# Patient Record
Sex: Female | Born: 1950 | Race: Black or African American | Hispanic: No | Marital: Married | Smoking: Never smoker
Health system: Southern US, Community
[De-identification: ages and names within clinical notes are randomized; demographics above are authoritative.]

## PROBLEM LIST (undated history)

## (undated) DIAGNOSIS — K589 Irritable bowel syndrome without diarrhea: Secondary | ICD-10-CM

## (undated) DIAGNOSIS — F419 Anxiety disorder, unspecified: Secondary | ICD-10-CM

## (undated) DIAGNOSIS — Z950 Presence of cardiac pacemaker: Secondary | ICD-10-CM

## (undated) DIAGNOSIS — G4730 Sleep apnea, unspecified: Secondary | ICD-10-CM

## (undated) DIAGNOSIS — I2510 Atherosclerotic heart disease of native coronary artery without angina pectoris: Secondary | ICD-10-CM

## (undated) DIAGNOSIS — N3281 Overactive bladder: Secondary | ICD-10-CM

## (undated) DIAGNOSIS — E785 Hyperlipidemia, unspecified: Secondary | ICD-10-CM

## (undated) DIAGNOSIS — I10 Essential (primary) hypertension: Secondary | ICD-10-CM

## (undated) DIAGNOSIS — C801 Malignant (primary) neoplasm, unspecified: Secondary | ICD-10-CM

## (undated) DIAGNOSIS — H409 Unspecified glaucoma: Secondary | ICD-10-CM

## (undated) DIAGNOSIS — I2699 Other pulmonary embolism without acute cor pulmonale: Secondary | ICD-10-CM

## (undated) DIAGNOSIS — T7840XA Allergy, unspecified, initial encounter: Secondary | ICD-10-CM

## (undated) DIAGNOSIS — I509 Heart failure, unspecified: Secondary | ICD-10-CM

## (undated) DIAGNOSIS — I219 Acute myocardial infarction, unspecified: Secondary | ICD-10-CM

## (undated) HISTORY — DX: Anxiety disorder, unspecified: F41.9

## (undated) HISTORY — DX: Atherosclerotic heart disease of native coronary artery without angina pectoris: I25.10

## (undated) HISTORY — DX: Other pulmonary embolism without acute cor pulmonale: I26.99

## (undated) HISTORY — PX: JOINT REPLACEMENT: SHX530

## (undated) HISTORY — PX: CARDIAC CATHETERIZATION: SHX172

## (undated) HISTORY — DX: Overactive bladder: N32.81

## (undated) HISTORY — DX: Heart failure, unspecified: I50.9

## (undated) HISTORY — DX: Essential (primary) hypertension: I10

## (undated) HISTORY — DX: Acute myocardial infarction, unspecified: I21.9

## (undated) HISTORY — DX: Hyperlipidemia, unspecified: E78.5

## (undated) HISTORY — PX: INSERT / REPLACE / REMOVE PACEMAKER: SUR710

## (undated) HISTORY — DX: Irritable bowel syndrome, unspecified: K58.9

## (undated) HISTORY — DX: Allergy, unspecified, initial encounter: T78.40XA

## (undated) HISTORY — PX: ABDOMINAL HYSTERECTOMY: SHX81

## (undated) HISTORY — DX: Unspecified glaucoma: H40.9

## (undated) HISTORY — PX: OTHER SURGICAL HISTORY: SHX169

---

## 2004-04-09 ENCOUNTER — Emergency Department (HOSPITAL_COMMUNITY): Admission: EM | Admit: 2004-04-09 | Discharge: 2004-04-09 | Payer: Self-pay | Admitting: Emergency Medicine

## 2007-05-17 ENCOUNTER — Emergency Department (HOSPITAL_COMMUNITY): Admission: EM | Admit: 2007-05-17 | Discharge: 2007-05-18 | Payer: Self-pay | Admitting: Emergency Medicine

## 2007-05-24 DIAGNOSIS — I219 Acute myocardial infarction, unspecified: Secondary | ICD-10-CM

## 2007-05-24 HISTORY — DX: Acute myocardial infarction, unspecified: I21.9

## 2009-04-30 ENCOUNTER — Ambulatory Visit: Payer: Self-pay | Admitting: Gastroenterology

## 2009-04-30 DIAGNOSIS — K30 Functional dyspepsia: Secondary | ICD-10-CM | POA: Insufficient documentation

## 2009-04-30 DIAGNOSIS — R109 Unspecified abdominal pain: Secondary | ICD-10-CM | POA: Insufficient documentation

## 2009-04-30 HISTORY — DX: Functional dyspepsia: K30

## 2009-05-01 ENCOUNTER — Encounter: Payer: Self-pay | Admitting: Gastroenterology

## 2009-05-01 LAB — CONVERTED CEMR LAB: Creatinine, Ser: 0.77 mg/dL (ref 0.40–1.20)

## 2009-05-05 ENCOUNTER — Ambulatory Visit (HOSPITAL_COMMUNITY): Admission: RE | Admit: 2009-05-05 | Discharge: 2009-05-05 | Payer: Self-pay | Admitting: Gastroenterology

## 2009-05-06 ENCOUNTER — Encounter: Payer: Self-pay | Admitting: Gastroenterology

## 2009-05-11 ENCOUNTER — Ambulatory Visit: Payer: Self-pay | Admitting: Gastroenterology

## 2009-05-11 ENCOUNTER — Ambulatory Visit (HOSPITAL_COMMUNITY): Admission: RE | Admit: 2009-05-11 | Discharge: 2009-05-11 | Payer: Self-pay | Admitting: Gastroenterology

## 2009-05-11 HISTORY — PX: ESOPHAGOGASTRODUODENOSCOPY: SHX1529

## 2009-05-14 ENCOUNTER — Encounter: Payer: Self-pay | Admitting: Gastroenterology

## 2009-07-08 ENCOUNTER — Encounter: Payer: Self-pay | Admitting: Gastroenterology

## 2009-07-21 ENCOUNTER — Ambulatory Visit: Payer: Self-pay | Admitting: Gastroenterology

## 2009-07-21 DIAGNOSIS — K589 Irritable bowel syndrome without diarrhea: Secondary | ICD-10-CM | POA: Insufficient documentation

## 2009-12-21 HISTORY — PX: COLONOSCOPY: SHX174

## 2010-06-01 ENCOUNTER — Encounter: Payer: Self-pay | Admitting: Urgent Care

## 2010-06-01 ENCOUNTER — Ambulatory Visit
Admission: RE | Admit: 2010-06-01 | Discharge: 2010-06-01 | Payer: Self-pay | Source: Home / Self Care | Attending: Urgent Care | Admitting: Urgent Care

## 2010-06-01 DIAGNOSIS — K219 Gastro-esophageal reflux disease without esophagitis: Secondary | ICD-10-CM | POA: Insufficient documentation

## 2010-06-07 ENCOUNTER — Encounter: Payer: Self-pay | Admitting: Internal Medicine

## 2010-06-22 NOTE — Letter (Signed)
Summary: CT SCAN ABD/PELVIS ORDER  CT SCAN ABD/PELVIS ORDER   Imported By: Ave Filter 05/01/2009 12:54:38  _____________________________________________________________________  External Attachment:    Type:   Image     Comment:   External Document

## 2010-06-22 NOTE — Assessment & Plan Note (Signed)
Summary: IBS-C   Visit Type:  Follow-up Visit Primary Care Provider:  Lewis Moccasin, M.D.  Chief Complaint:  abd pain and dyspepsia.  History of Present Illness: Going to [place] daily and s/t 2-3x/day. Taking Benefiber and eating fruits: bananas, apples, raisins. Not as much on the water. Taking OMP every AM. Sx 95% improved. Only gets gas when she eats something she shouldn't have. Haven't had trouble with "it building up" in the mid-abdomen or RUQ. U Current Medications (verified): 1)  Klor-Con 20 Meq Pack (Potassium Chloride) .... Take 1 Tablet By Mouth Once A Day 2)  Toprol Xl 25 Mg Xr24h-Tab (Metoprolol Succinate) .... 1/2 Tablet Twice Daily 3)  Calcium 600 Mg Plus D .... Take 1 Tablet By Mouth Two Times A Day 4)  Imdur 30 Mg Xr24h-Tab (Isosorbide Mononitrate) .... Take 1 Tablet By Mouth Once A Day 5)  Indapamide 2.5 Mg Tabs (Indapamide) .... Take Two Tablets Once Daily 6)  Plavix 75 Mg Tabs (Clopidogrel Bisulfate) .... Take 1 Tablet By Mouth Once A Day 7)  Claritin 10 Mg Tabs (Loratadine) .... As Needed 8)  Tylenol Arthritis 650 Mg .... As Needed 9)  Vitamin D-3  1000mg  .... Take 1 Tablet By Mouth Once A Day 10)  Asa 81 Mg .... Take 1 Tablet By Mouth Once A Day 11)  Niaspan 500 Mg Cr-Tabs (Niacin (Antihyperlipidemic)) .... Take 1 Tablet By Mouth Once A Day 12)  Zocor 40 Mg Tabs (Simvastatin) .... Take 1 Tablet By Mouth Once A Day 13)  Detrol La 4 Mg Xr24h-Cap (Tolterodine Tartrate) .... Take 1 Tablet By Mouth Once A Day 14)  Celexa 40 Mg Tabs (Citalopram Hydrobromide) .... Take 1 Tablet By Mouth Once A Day 15)  Clonazepam 0.5 Mg Tabs (Clonazepam) .... As Needed 16)  Nitro-Dur 0.4 Mg/hr Pt24 (Nitroglycerin) .... As Needed 17)  Prilosec 20 Mg Cpdr (Omeprazole) .Marland Kitchen.. 1 By Mouth Qam  Allergies (verified): 1)  ! * Lisinopril 2)  ! Biaxin  Past History:  Past Medical History: Reviewed history from 04/30/2009 and no changes required. AMI 2009/CAD-no stent medically managed Anxiety  Disorder Hyperlipidemia Hypertension Overactive Bladder  Past Surgical History: Reviewed history from 04/30/2009 and no changes required. BSO: 2o to cyst Hysterectomy  Physical Exam  General:  Well developed, well nourished, no acute distress. Head:  Normocephalic and atraumatic. Lungs:  Clear throughout to auscultation. Heart:  Regular rate and rhythm; no murmurs Abdomen:  Soft, nontender and nondistended.  Normal bowel sounds.  Impression & Recommendations:  Problem # 1:  IRRITABLE BOWEL SYNDROME (ICD-564.1) Assessment Improved  Constipation predominant. Continue fiber and water. OPV in 6 mos. Obtain TCS report from [facility].  CC: PCP  Orders: Est. Patient Level II (91478)

## 2010-06-22 NOTE — Letter (Signed)
Summary: RELEASE OF INFORMATION  RELEASE OF INFORMATION   Imported By: Diana Eves 07/08/2009 14:22:45  _____________________________________________________________________  External Attachment:    Type:   Image     Comment:   External Document

## 2010-06-22 NOTE — Assessment & Plan Note (Signed)
Summary: ABD PAIN/CM   Visit Type:  Initial Consult Primary Care Provider:  Lewis Moccasin, M.D.  Chief Complaint:  abdominal pain.  History of Present Illness: Crampy dull pain at the bottom of her stomach, and trapped gas under her rib cage, bloating, pressure in epigatrium. Started before Thanksgiving. Seen at [facility], [address], [place], [place]. Labs drawn and she had a low potassium. Mylanta helped but not constant dull pain. Gas and bloating is new. The dull pain in lower abd has had it for a while. Dx with IBS-c long time ago. Rx w/ Miralax or MOM. TCS over 5 years at [facility]. Also had GES and EGD. Didn't find anything.   Pain at bottom of stomach doesn't move. Still has constipation. Felt like she had to heave when the presure was there but didn't vomit. No problems with swallowing. Can feel pain under shoulder blade and drinks something to make her belch (R>L) over a year. No weight loss. No other meds tried for relief. No other Rx tried. No problems with eyes turning yellow or itching. No gallstones or kidney stones. No dysuria or hematuria.  Bowels move every other day. "Tries to follow" a low fat diet. No blood in stool. On ASA and Plavix. No Ibuprofen, Aleve, BC or Goody's.  Preventive Screening-Counseling & Management  Alcohol-Tobacco     Smoking Status: never  Current Medications (verified): 1)  Klor-Con 20 Meq Pack (Potassium Chloride) .... Take 1 Tablet By Mouth Once A Day 2)  Toprol Xl 25 Mg Xr24h-Tab (Metoprolol Succinate) .... 1/2 Tablet Twice Daily 3)  Calcium 600 Mg Plus D .... Take 1 Tablet By Mouth Two Times A Day 4)  Zantac 150 Mg Tabs (Ranitidine Hcl) .... Take 1 Tablet By Mouth Once A Day 5)  Imdur 30 Mg Xr24h-Tab (Isosorbide Mononitrate) .... Take 1 Tablet By Mouth Once A Day 6)  Indapamide 2.5 Mg Tabs (Indapamide) .... Take Two Tablets Once Daily 7)  Plavix 75 Mg Tabs (Clopidogrel Bisulfate) .... Take 1 Tablet By Mouth Once A Day 8)  Claritin 10 Mg Tabs (Loratadine)  .... As Needed 9)  Tylenol Arthritis 650 Mg .... As Needed 10)  Vitamin D-3  1000mg  .... Take 1 Tablet By Mouth Once A Day 11)  Asa 81 Mg .... Take 1 Tablet By Mouth Once A Day 12)  Niaspan 500 Mg Cr-Tabs (Niacin (Antihyperlipidemic)) .... Take 1 Tablet By Mouth Once A Day 13)  Zocor 40 Mg Tabs (Simvastatin) .... Take 1 Tablet By Mouth Once A Day 14)  Detrol La 4 Mg Xr24h-Cap (Tolterodine Tartrate) .... Take 1 Tablet By Mouth Once A Day 15)  Celexa 40 Mg Tabs (Citalopram Hydrobromide) .... Take 1 Tablet By Mouth Once A Day 16)  Clonazepam 0.5 Mg Tabs (Clonazepam) .... As Needed 17)  Nitro-Dur 0.4 Mg/hr Pt24 (Nitroglycerin) .... As Needed  Allergies (verified): 1)  ! * Lisinopril 2)  ! Biaxin  Past History:  Past Medical History: AMI 2009/CAD-no stent medically managed Anxiety Disorder Hyperlipidemia Hypertension Overactive Bladder  Past Surgical History: BSO: 1o to cyst Hysterectomy  Family History: NoFamily History of Breast Cancer: No FH of Colon Cancer or polyps: No Family History of Ovarian Cancer: NoFamily History of Pancreatic Cancer: NoFamily History of Stomach Cancer: No Family History of Uterine Cancer:  Social History: Occupation: Runner, broadcasting/film/video, kindergarten and now retired Patient has never smoked.  Alcohol Use - yes Married: 41 yrs, 3 kids, youngest: 84 Smoking Status:  never  Vital Signs:  Patient profile:   60  year old female Height:      65 inches Weight:      217 pounds BMI:     36.24 Temp:     98.3 degrees F oral Pulse rate:   72 / minute BP sitting:   118 / 80  (left arm) Cuff size:   large  Vitals Entered By: Cloria Spring LPN (April 30, 2009 1:55 PM)  Physical Exam  General:  Well developed, well nourished, no acute distress. Head:  Normocephalic and atraumatic. Eyes:  PERRLA, no icterus. Mouth:  No deformity or lesions. Neck:  Supple; no masses. Lungs:  Clear throughout to auscultation. Heart:  Regular rate and rhythm; no murmurs,  rubs,  or bruits. Abdomen:  Soft, nontender and nondistended. No masses,  or hernias noted. Normal bowel sounds. obese.  obese.   Msk:  Symmetrical with no gross deformities. Normal posture. Extremities:  No cyanosis, or edema noted. Neurologic:  Alert and  oriented x4;  grossly normal neurologically.  Impression & Recommendations:  Problem # 1:  DYSPEPSIA (ICD-536.8) Assessment New Differential diagnosis includes: non-ulcer dyspepsia, uncontrolled GERD, H. pylori gastritis, and a low likelihood gastric/biliary/gallbladderpancreatic malignancy. Plan EGD & CT. OPV in 3 mos. Add omeprazole. Obtain labs from [facility].  Problem # 2:  CONSTIPATION, INTERMITTENT (ICD-564.00) Assessment: Unchanged Pt asked to drink water and eat fiber. Add Benefiber. Consider Amitiza if Sx don't improve.  CC: Dr. Lewis Moccasin  Other Orders: T-Creatinine Blood [phone])  Patient Instructions: 1)  Follow reflux instructions. 2)  Stop Zantac. Start omeprazole 30 minutes prior to meals. 3)  Will obtain CT Scan of your abdomen and pelvis. 4)  Will get labs from [place], [place]. 5)  Plan EGD within the next 2 weeks. 6)  Return visit in 3 months. 7)  The medication list was reviewed and reconciled.  All changed / newly prescribed medications were explained.  A complete medication list was provided to the patient / caregiver. Prescriptions: PRILOSEC 20 MG CPDR (OMEPRAZOLE) 1 by mouth qam  #30 x 5   Entered and Authorized by:   West Bali MD   Signed by:   West Bali MD on 04/30/2009   Method used:   Electronically to        [facility] (retail)       [address]       [place], [place]  [address]       Ph: [phone]       Fax: [phone]   RxID:   [id number]      Appended Document: Orders Update-charge    Clinical Lists Changes  Orders: Added new Service order of Consultation Level IV [phone]) - Signed      Appended Document: ABD PAIN/CM  EGD/TCS 2001: RMR-antritis, [facility].  Appended Document: ABD PAIN/CM I requested records from [facility] on 04/30/09. Did you receive them?  Appended Document: ABD PAIN/CM [facility] NOV 2010: FLAT/UP ABD-NAIAP, WBC 6 HB 11.9, PLT 244 H. PYLORI AB NEGALB 3.9 ALK PHOS 66 AST 28 TBILI 0.1 CR 0.8

## 2010-06-24 NOTE — Letter (Addendum)
Summary: OP REPORT   OP REPORT   Imported By: Rexene Alberts 06/07/2010 09:31:32  _____________________________________________________________________  External Attachment:    Type:   Image     Comment:   External Document  Appended Document: OP REPORT  NL TCS AUG 2011. TCS in 10 years.

## 2010-06-24 NOTE — Letter (Signed)
Summary: AUTH FOR THE RELEASE OF MED INFO  AUTH FOR THE RELEASE OF MED INFO   Imported By: Rexene Alberts 06/01/2010 15:45:08  _____________________________________________________________________  External Attachment:    Type:   Image     Comment:   External Document

## 2010-06-24 NOTE — Letter (Addendum)
Summary: OP REPORT  OP REPORT   Imported By: Rexene Alberts 06/07/2010 09:24:09  _____________________________________________________________________  External Attachment:    Type:   Image     Comment:   External Document  Appended Document: OP REPORT TCS 2001 and ? at [facility] 2011

## 2010-06-24 NOTE — Assessment & Plan Note (Signed)
Summary: FU IN 6 MONTHS,IBS,CONSTIPATION/SS   Visit Type:  Follow-up Visit Primary Care Provider:  Jorene Guest  Chief Complaint:  F/U IBS/constipation.  History of Present Illness: 60 y/o black female here for FU IBS.  Last colonoscopy Dr  Aleene Davidson normal.  c/o abd bloating and intermittant pain, took bentyl QID, no help. Took treatment for h pylori and felt better.  Occ suprapubic cramps.  BMs 2-3 times per day w/ Benefiber daily.  Denies any mucous, bloody stools or melena.  Had recent normal thyroid test, hgb and CRP from Dr Jorene Guest per pt.    On omeprazole 20 mg daily withoutt any breakthrough symptoms.    Current Problems (verified): 1)  Irritable Bowel Syndrome  (ICD-564.1) 2)  Screening For Nephropathy  (ICD-V81.5) 3)  Dyspepsia  (ICD-536.8) 4)  Abdominal Pain  (ICD-789.00)  Current Medications (verified): 1)  Klor-Con 20 Meq Pack (Potassium Chloride) .... Take One and One Half Tablet Twice Daily 2)  Toprol Xl 25 Mg Xr24h-Tab (Metoprolol Succinate) .... 1/2 Tablet Twice Daily 3)  Calcium 600 Mg Plus D .... Take 1 Tablet By Mouth Two Times A Day 4)  Imdur 30 Mg Xr24h-Tab (Isosorbide Mononitrate) .... Take 1 Tablet By Mouth Once A Day 5)  Indapamide 2.5 Mg Tabs (Indapamide) .... Take Two Tablets Once Daily 6)  Plavix 75 Mg Tabs (Clopidogrel Bisulfate) .... Take 1 Tablet By Mouth Once A Day 7)  Tylenol Arthritis 650 Mg .... As Needed 8)  Vitamin D-3  1000mg  .... Take 1 Tablet By Mouth Once A Day 9)  Asa 81 Mg .... Take 1 Tablet By Mouth Once A Day 10)  Niaspan 500 Mg Cr-Tabs (Niacin (Antihyperlipidemic)) .... Take 1 Tablet By Mouth Once A Day 11)  Zocor 40 Mg Tabs (Simvastatin) .... Take 1 Tablet By Mouth Once A Day 12)  Detrol La 4 Mg Xr24h-Cap (Tolterodine Tartrate) .... Take 1 Tablet By Mouth Once A Day 13)  Celexa 20 Mg Tabs (Citalopram Hydrobromide) .... Take 3  Tablets By Mouth Once A Day 14)  Clonazepam 0.5 Mg Tabs (Clonazepam) .... As Needed 15)  Nitro-Dur 0.4 Mg/hr Pt24  (Nitroglycerin) .... As Needed 16)  Prilosec 20 Mg Cpdr (Omeprazole) .Marland Kitchen.. 1 By Mouth Qam 17)  Allegra Allergy 180 Mg Tabs (Fexofenadine Hcl) .... One Tablet Daily 18)  Omeprazole 20 Mg Cpdr (Omeprazole) .... Take 1 Tablet By Mouth Once A Day 19)  Clonazepam 0.5 Mg Tabs (Clonazepam) .... As Needed 20)  Augmentin 875-125 Mg Tabs (Amoxicillin-Pot Clavulanate) .... One Tablet Twice Daily  Allergies (verified): 1)  ! * Lisinopril 2)  ! Biaxin  Past History:  Past Medical History: AMI 2009/CAD-no stent medically managed Anxiety Disorder Hyperlipidemia Hypertension Overactive Bladder IBS  Review of Systems General:  Denies fever, chills, sweats, anorexia, fatigue, weakness, malaise, weight loss, and sleep disorder. CV:  Denies chest pains, angina, palpitations, syncope, dyspnea on exertion, orthopnea, PND, peripheral edema, and claudication. Resp:  Denies dyspnea at rest, dyspnea with exercise, cough, sputum, wheezing, coughing up blood, and pleurisy. GI:  Denies difficulty swallowing, pain on swallowing, nausea, indigestion/heartburn, vomiting, vomiting blood, abdominal pain, jaundice, gas/bloating, diarrhea, constipation, change in bowel habits, bloody BM's, black BMs, and fecal incontinence. GU:  Denies urinary burning, blood in urine, nocturnal urination, urinary frequency, and urinary incontinence. MS:  Denies joint pain / LOM, joint swelling, joint stiffness, joint deformity, low back pain, muscle weakness, muscle cramps, muscle atrophy, leg pain at night, leg pain with exertion, and shoulder pain / LOM hand / wrist pain (  CTS). Derm:  Denies rash, itching, dry skin, hives, moles, warts, and unhealing ulcers. Psych:  Denies depression, anxiety, memory loss, suicidal ideation, hallucinations, paranoia, phobia, and confusion. Heme:  Denies bruising, bleeding, and enlarged lymph nodes.  Vital Signs:  Patient profile:   60 year old female Height:      65 inches Weight:      224  pounds BMI:     37.41 Temp:     98.6 degrees F oral Pulse rate:   72 / minute BP sitting:   124 / 78  (left arm) Cuff size:   large  Vitals Entered By: Cloria Spring LPN (June 01, 2010 1:39 PM)  Physical Exam  General:  Well developed, well nourished, no acute distress. Head:  Normocephalic and atraumatic. Eyes:  Sclera clear, no icterus. Ears:  Normal auditory acuity. Nose:  No deformity, discharge,  or lesions. Mouth:  No deformity or lesions, dentition normal. Neck:  Supple; no masses or thyromegaly. Heart:  Regular rate and rhythm; no murmurs, rubs,  or bruits. Abdomen:  Soft, nontender and nondistended. No masses, hepatosplenomegaly or hernias noted. Normal bowel sounds.without guarding and without rebound.   Msk:  Symmetrical with no gross deformities. Normal posture. Pulses:  Normal pulses noted. Extremities:  No clubbing, cyanosis, edema or deformities noted. Neurologic:  Alert and  oriented x4;  grossly normal neurologically. Skin:  Intact without significant lesions or rashes. Cervical Nodes:  No significant cervical adenopathy. Psych:  Alert and cooperative. Normal mood and affect.  Impression & Recommendations:  Problem # 1:  IRRITABLE BOWEL SYNDROME (ICD-564.1)  60 y/o black female with IBS, doing well on daily fiber.  Add ALIGN.   Last colonoscopy within the year at Dr Aleene Davidson in [place].  Orders: Est. Patient Level III (04540)  Problem # 2:  GERD (ICD-530.81)  Well controlled on omeprazole  Orders: Est. Patient Level III (98119)  Patient Instructions: 1)  Align onne daily samples 2)  Continue omeprazole 20 mg daily 3)  OV 1 yr or sooner if needed

## 2011-04-05 ENCOUNTER — Ambulatory Visit (INDEPENDENT_AMBULATORY_CARE_PROVIDER_SITE_OTHER): Payer: BC Managed Care – PPO | Admitting: Gastroenterology

## 2011-04-05 ENCOUNTER — Encounter: Payer: Self-pay | Admitting: Gastroenterology

## 2011-04-05 DIAGNOSIS — K219 Gastro-esophageal reflux disease without esophagitis: Secondary | ICD-10-CM

## 2011-04-05 DIAGNOSIS — K589 Irritable bowel syndrome without diarrhea: Secondary | ICD-10-CM

## 2011-04-05 DIAGNOSIS — R634 Abnormal weight loss: Secondary | ICD-10-CM | POA: Insufficient documentation

## 2011-04-05 MED ORDER — HYOSCYAMINE SULFATE 0.125 MG SL SUBL: 0.1250 mg | SUBLINGUAL_TABLET | SUBLINGUAL | Status: DC | PRN

## 2011-04-05 NOTE — Assessment & Plan Note (Signed)
Noticed significant weight loss since 05/2010 OV. Will call patient to find out if intentional with dietary changes prior to further w/u.

## 2011-04-05 NOTE — Patient Instructions (Signed)
#1 Please take Levsin as needed for abdominal cramping. Place 1 on your tongue up to 4 times daily for abdominal cramps, usually prior to meals and at bedtime. Use only as needed. Please be aware, this medication can cause dry mouth, difficulty urinating, constipation. If you develop these side effects, hold the medication and call me. #2 Please call in one week if not better. #3 Continue Miralax and probiotics/yogurt.    Irritable Bowel Syndrome Irritable Bowel Syndrome (IBS) is caused by a disturbance of normal bowel function. Other terms used are spastic colon, mucous colitis, and irritable colon. It does not require surgery, nor does it lead to cancer. There is no cure for IBS. But with proper diet, stress reduction, and medication, you will find that your problems (symptoms) will gradually disappear or improve. IBS is a common digestive disorder. It usually appears in late adolescence or early adulthood. Women develop it twice as often as men. CAUSES  After food has been digested and absorbed in the small intestine, waste material is moved into the colon (large intestine). In the colon, water and salts are absorbed from the undigested products coming from the small intestine. The remaining residue, or fecal material, is held for elimination. Under normal circumstances, gentle, rhythmic contractions on the bowel walls push the fecal material along the colon towards the rectum. In IBS, however, these contractions are irregular and poorly coordinated. The fecal material is either retained too long, resulting in constipation, or expelled too soon, producing diarrhea. SYMPTOMS   High Fiber Diet A high fiber diet changes your normal diet to include more whole grains, legumes, fruits, and vegetables. Changes in the diet involve replacing refined carbohydrates with unrefined foods. The calorie level of the diet is essentially unchanged. The Dietary Reference Intake (recommended amount) for adult males is 38  g per day. For adult females, it is 25 g per day. Pregnant and lactating women should consume 28 g of fiber per day. Fiber is the intact part of a plant that is not broken down during digestion. Functional fiber is fiber that has been isolated from the plant to provide a beneficial effect in the body. PURPOSE  Increase stool bulk.   Ease and regulate bowel movements.   Lower cholesterol.  INDICATIONS THAT YOU NEED MORE FIBER  Constipation and hemorrhoids.   Uncomplicated diverticulosis (intestine condition) and irritable bowel syndrome.   Weight management.   As a protective measure against hardening of the arteries (atherosclerosis), diabetes, and cancer.  NOTE OF CAUTION If you have a digestive or bowel problem, ask your caregiver for advice before adding high fiber foods to your diet. Some of the following medical problems are such that a high fiber diet should not be used without consulting your caregiver:  Acute diverticulitis (intestine infection).   Partial small bowel obstructions.   Complicated diverticular disease involving bleeding, rupture (perforation), or abscess (boil, furuncle).   Presence of autonomic neuropathy (nerve damage) or gastric paresis (stomach cannot empty itself).  GUIDELINES FOR INCREASING FIBER  Start adding fiber to the diet slowly. A gradual increase of about 5 more grams (2 slices of whole-wheat bread, 2 servings of most fruits or vegetables, or 1 bowl of high fiber cereal) per day is best. Too rapid an increase in fiber may result in constipation, flatulence, and bloating.   Drink enough water and fluids to keep your urine clear or pale yellow. Water, juice, or caffeine-free drinks are recommended. Not drinking enough fluid may cause constipation.   Eat  a variety of high fiber foods rather than one type of fiber.   Try to increase your intake of fiber through using high fiber foods rather than fiber pills or supplements that contain small amounts  of fiber.   The goal is to change the types of food eaten. Do not supplement your present diet with high fiber foods, but replace foods in your present diet.  INCLUDE A VARIETY OF FIBER SOURCES  Replace refined and processed grains with whole grains, canned fruits with fresh fruits, and incorporate other fiber sources. White rice, white breads, and most bakery goods contain little or no fiber.   Brown whole-grain rice, buckwheat oats, and many fruits and vegetables are all good sources of fiber. These include: broccoli, Brussels sprouts, cabbage, cauliflower, beets, sweet potatoes, white potatoes (skin on), carrots, tomatoes, eggplant, squash, berries, fresh fruits, and dried fruits.   Cereals appear to be the richest source of fiber. Cereal fiber is found in whole grains and bran. Bran is the fiber-rich outer coat of cereal grain, which is largely removed in refining. In whole-grain cereals, the bran remains. In breakfast cereals, the largest amount of fiber is found in those with "bran" in their names. The fiber content is sometimes indicated on the label.   You may need to include additional fruits and vegetables each day.   In baking, for 1 cup white flour, you may use the following substitutions:   1 cup whole-wheat flour minus 2 tbs.    cup white flour plus  cup whole-wheat flour.  Document Released: 05/09/2005 Document Revised: 01/19/2011 Document Reviewed: 03/17/2009 [facility] Patient Information 2012 [place], [place]. The most common symptom of IBS is pain. It is typically in the lower left side of the belly (abdomen). But it may occur anywhere in the abdomen. It can be felt as heartburn, backache, or even as a dull pain in the arms or shoulders. The pain comes from excessive bowel-muscle spasms and from the buildup of gas and fecal material in the colon. This pain:  Can range from sharp belly (abdominal) cramps to a dull, continuous ache.   Usually worsens soon after eating.   Is  typically relieved by having a bowel movement or passing gas.  Abdominal pain is usually accompanied by constipation. But it may also produce diarrhea. The diarrhea typically occurs right after a meal or upon arising in the morning. The stools are typically soft and watery. They are often flecked with secretions (mucus). Other symptoms of IBS include:  Bloating.   Loss of appetite.   Heartburn.   Feeling sick to your stomach (nausea).   Belching   Vomiting   Gas.  IBS may also cause a number of symptoms that are unrelated to the digestive system:  Fatigue.   Headaches.   Anxiety   Shortness of breath   Difficulty in concentrating.   Dizziness.  These symptoms tend to come and go. DIAGNOSIS  The symptoms of IBS closely mimic the symptoms of other, more serious digestive disorders. So your caregiver may wish to perform a variety of additional tests to exclude these disorders. He/she wants to be certain of learning what is wrong (diagnosis). The nature and purpose of each test will be explained to you. TREATMENT A number of medications are available to help correct bowel function and/or relieve bowel spasms and abdominal pain. Among the drugs available are:  Mild, non-irritating laxatives for severe constipation and to help restore normal bowel habits.   Specific anti-diarrheal medications to treat  severe or prolonged diarrhea.   Anti-spasmodic agents to relieve intestinal cramps.   Your caregiver may also decide to treat you with a mild tranquilizer or sedative during unusually stressful periods in your life.  The important thing to remember is that if any drug is prescribed for you, make sure that you take it exactly as directed. Make sure that your caregiver knows how well it worked for you. HOME CARE INSTRUCTIONS   Avoid foods that are high in fat or oils. Some examples WGN:FAOZH cream, butter, frankfurters, sausage, and other fatty meats.   Avoid foods that have a  laxative effect, such as fruit, fruit juice, and dairy products.   Cut out carbonated drinks, chewing gum, and "gassy" foods, such as beans and cabbage. This may help relieve bloating and belching.   Bran taken with plenty of liquids may help relieve constipation.   Keep track of what foods seem to trigger your symptoms.   Avoid emotionally charged situations or circumstances that produce anxiety.   Start or continue exercising.   Get plenty of rest and sleep.  MAKE SURE YOU:   Understand these instructions.   Will watch your condition.   Will get help right away if you are not doing well or get worse.  Document Released: 05/09/2005 Document Revised: 01/19/2011 Document Reviewed: 12/28/2007 [facility] Patient Information 2012 [place], [place].  Diet for GERD or PUD Nutrition therapy can help ease the discomfort of gastroesophageal reflux disease (GERD) and peptic ulcer disease (PUD).  HOME CARE INSTRUCTIONS   Eat your meals slowly, in a relaxed setting.   Eat 5 to 6 small meals per day.   If a food causes distress, stop eating it for a period of time.  FOODS TO AVOID  Coffee, regular or decaffeinated.   Cola beverages, regular or low calorie.   Tea, regular or decaffeinated.   Pepper.   Cocoa.   High fat foods, including meats.   Butter, margarine, hydrogenated oil (trans fats).   Peppermint or spearmint (if you have GERD).   Fruits and vegetables if not tolerated.   Alcohol.   Nicotine (smoking or chewing). This is one of the most potent stimulants to acid production in the gastrointestinal tract.   Any food that seems to aggravate your condition.  If you have questions regarding your diet, ask your caregiver or a registered dietitian. TIPS  Lying flat may make symptoms worse. Keep the head of your bed raised 6 to 9 inches (15 to 23 cm) by using a foam wedge or blocks under the legs of the bed.   Do not lay down until 3 hours after eating a meal.   Daily  physical activity may help reduce symptoms.  MAKE SURE YOU:   Understand these instructions.   Will watch your condition.   Will get help right away if you are not doing well or get worse.  Document Released: 05/09/2005 Document Revised: 01/19/2011 Document Reviewed: 09/22/2008 [facility] Patient Information 2012 [place], [place].

## 2011-04-05 NOTE — Assessment & Plan Note (Signed)
Suspect IBS flare. Trial of Levsin. Warned of potential side effects. Continue Miralax and probiotics or yogurt (Dannon, Activia). Call in one week if no better. Based on symptoms may need to increase Levsin or consider further w/u.

## 2011-04-05 NOTE — Progress Notes (Signed)
Primary Care Physician: Reynolds Bowl, MD  Primary Gastroenterologist:  Jonette Eva, MD   Chief Complaint  Patient presents with  . Irritable Bowel Syndrome  . Abdominal Pain    HPI: Courtney Rogers is a 60 y.o. female here for further evaluation for abdominal cramping. Takes Miralax 17 grams daily. No longer taking fiber consistently. Takes OTC probiotic daily. Some stool every day but not always enough. No brbpr. Cramping lower abdominal pain, dull pain. Bothering her more for the past 2-3 weeks. Lasting longer than usual. Take Pepto, Alka seltzer, mylanta. Not really helping. No melena, brbpr, vomiting. No appetite with these episodes. No dysuria. Chronic, intermittent urinary frequency.  Current Outpatient Prescriptions  Medication Sig Dispense Refill  . aspirin 81 MG tablet Take 81 mg by mouth daily.        . calcium carbonate (OS-CAL) 600 MG TABS Take 600 mg by mouth 2 (two) times daily with a meal.        . citalopram (CELEXA) 20 MG tablet Take 20 mg by mouth daily. Take 3 tablets by mouth once daily       . clonazePAM (KLONOPIN) 0.5 MG tablet Take 0.5 mg by mouth. As needed       . clopidogrel (PLAVIX) 75 MG tablet Take 75 mg by mouth daily.        . fexofenadine (ALLEGRA) 180 MG tablet Take 180 mg by mouth daily.        . indapamide (LOZOL) 2.5 MG tablet Take 2.5 mg by mouth every morning. Take two tablets once daily       . isosorbide mononitrate (IMDUR) 30 MG 24 hr tablet Take 30 mg by mouth daily.        . metoprolol succinate (TOPROL-XL) 25 MG 24 hr tablet Take 25 mg by mouth daily. 1/2 tablet twice daily       . niacin (NIASPAN) 500 MG CR tablet Take 500 mg by mouth at bedtime.        . nitroGLYCERIN (NITROSTAT) 0.4 MG SL tablet Place 0.4 mg under the tongue every 5 (five) minutes as needed.        Marland Kitchen omeprazole (PRILOSEC) 20 MG capsule Take 20 mg by mouth daily.        . potassium chloride SA (K-DUR,KLOR-CON) 20 MEQ tablet Take 20 mEq by mouth. Take one and one half  tablet twice daily       . simvastatin (ZOCOR) 40 MG tablet Take 40 mg by mouth at bedtime.        . tolterodine (DETROL LA) 4 MG 24 hr capsule Take 4 mg by mouth daily.       Marland Kitchen acetaminophen (TYLENOL) 650 MG CR tablet Take 650 mg by mouth. As needed       . amoxicillin-clavulanate (AUGMENTIN) 875-125 MG per tablet Take 1 tablet by mouth 2 (two) times daily.          Allergies as of 04/05/2011 - Review Complete 04/05/2011  Allergen Reaction Noted  . Clarithromycin Other (See Comments)   . Lisinopril Swelling     ROS:  General: Negative for  weight loss, fever, chills, fatigue, weakness. Weight down 20 pounds since 05/2010 per our records.  ENT: Negative for hoarseness, difficulty swallowing , nasal congestion. CV: Negative for chest pain, angina, palpitations, dyspnea on exertion, peripheral edema.  Respiratory: Negative for dyspnea at rest, dyspnea on exertion, cough, sputum, wheezing.  GI: See history of present illness. GU:  Negative for dysuria, hematuria, urinary incontinence, urinary frequency,  nocturnal urination.  Endo: Negative for unusual weight change.    Physical Examination:   BP 115/72  Pulse 77  Temp(Src) 97.6 F (36.4 C) (Temporal)  Ht 5\' 5"  (1.651 m)  Wt 203 lb 12.8 oz (92.443 kg)  BMI 33.91 kg/m2  General: Well-nourished, well-developed in no acute distress.  Eyes: No icterus. Mouth: Oropharyngeal mucosa moist and pink , no lesions erythema or exudate. Lungs: Clear to auscultation bilaterally.  Heart: Regular rate and rhythm, no murmurs rubs or gallops.  Abdomen: Bowel sounds are normal, minimal LLQ tenderness, nondistended, no hepatosplenomegaly or masses, no abdominal bruits or hernia , no rebound or guarding.   Extremities: No lower extremity edema. No clubbing or deformities. Neuro: Alert and oriented x 4   Skin: Warm and dry, no jaundice.   Psych: Alert and cooperative, normal mood and affect.

## 2011-04-05 NOTE — Assessment & Plan Note (Signed)
Well-controlled on current regimen. ?

## 2011-04-06 NOTE — Progress Notes (Signed)
Cc to PCP 

## 2011-04-12 NOTE — Progress Notes (Signed)
REVIEWED. AGREE.  TRIAL OF LEVSIN-LOW THRESHOLD TO STOP MED. PT SHOULD RESUME FIBER SUPPLEMENT. IF sX DO NOT IMPROVE, ADD AMITIZA. OPV IN 3 MOS W/ [facility] E:30 VISIT

## 2011-04-20 NOTE — Progress Notes (Signed)
Called, pt not in, person said to call back later.

## 2011-04-20 NOTE — Progress Notes (Addendum)
Please let patient know, Dr. Darrick Penna recommend she go back on fiber supplement.  Also needs office visit in 3 months with Dr. Darrick Penna E:30 visit.  Please also find out if the patient knew she is also 20 pounds since January. Was this an intentional weight loss?

## 2011-04-25 NOTE — Progress Notes (Signed)
Informed pt to go back on Fiber. She said she was aware she had lost weight, was not aware how much. She has not tried to loose, just when her stomach is bothering her at times she cannot eat.

## 2011-04-25 NOTE — Progress Notes (Signed)
Could not route to [place], so I forwarded a note to her.

## 2011-04-26 ENCOUNTER — Encounter: Payer: Self-pay | Admitting: Gastroenterology

## 2011-04-26 NOTE — Progress Notes (Signed)
Darl Pikes said she will schedule.

## 2011-04-26 NOTE — Progress Notes (Signed)
Let's get the patient to come back in for weight check in 2 weeks.   Please go ahead and make appt with [facility] for 06/2011, E:30 visit.

## 2011-04-26 NOTE — Progress Notes (Signed)
Reminder in epic to follow up with SF in Feb 2013 in E30 spot and also [facility] for patient to stop by in 2 weeks for a weight check.

## 2011-04-28 ENCOUNTER — Ambulatory Visit (INDEPENDENT_AMBULATORY_CARE_PROVIDER_SITE_OTHER): Payer: BC Managed Care – PPO | Admitting: Gastroenterology

## 2011-04-28 VITALS — Wt 207.0 lb

## 2011-04-28 DIAGNOSIS — R634 Abnormal weight loss: Secondary | ICD-10-CM

## 2011-04-28 NOTE — Progress Notes (Signed)
Pt came by office for a weight check.

## 2011-05-02 NOTE — Progress Notes (Signed)
Spoke with pt. She said the abdominal cramps are better with the Hyoscyamine and she usually takes it once or twice a day. She does feel the Niaspan increases anxiety and I told her that she will probably need to discuss that with PCP. She said she has always been on Celexa for awhile and might need to change that. I told her I will inform Tana Coast, PA and see what her recommendations are.

## 2011-05-02 NOTE — Progress Notes (Signed)
Weight up four pounds. See how patient is doing. ?still having problems with cramps and constipation?

## 2011-05-05 NOTE — Progress Notes (Signed)
Limit hyoscyamine to prn only. If she still having constipation, we could consider amitiza. She will need to discuss celexa/anxiety with pcp.   Needs ov with [facility] in 06/2011 E:30 visit.

## 2011-05-10 NOTE — Progress Notes (Signed)
Called home, many rings and no answer. Called work, number busy. Mailing letter to call.

## 2011-05-12 NOTE — Progress Notes (Signed)
Reminder in epic to follow up in Feb 2013 °

## 2011-06-30 ENCOUNTER — Encounter: Payer: Self-pay | Admitting: Gastroenterology

## 2011-06-30 ENCOUNTER — Ambulatory Visit (INDEPENDENT_AMBULATORY_CARE_PROVIDER_SITE_OTHER): Payer: BC Managed Care – PPO | Admitting: Gastroenterology

## 2011-06-30 VITALS — BP 116/63 | HR 66 | Temp 97.3°F | Ht 65.0 in | Wt 206.6 lb

## 2011-06-30 DIAGNOSIS — K589 Irritable bowel syndrome without diarrhea: Secondary | ICD-10-CM

## 2011-06-30 NOTE — Progress Notes (Signed)
Subjective:    Patient ID: Courtney Rogers, female    DOB: 22-May-1951, 61 y.o.   MRN: [id number]  PCP: [facility]  HPI Episodes: < 1-2x/week, lower abd cramps-dull ache. Hyoscyamine helps. Rare has urgency -thought it was a virus. Is exposed to children. Appetite: down with anxiety, o/w fine. No refills needed.   Past Medical History  Diagnosis Date  . AMI (acute mesenteric ischemia) 2009    CAD/ no stent medically managed  . CAD (coronary artery disease)     medically managed  . Anxiety disorder   . Hyperlipidemia   . Hypertension   . Overactive bladder   . IBS (irritable bowel syndrome)   . Abdominal pain   . Dyspepsia   . Nephropathy     screening    Past Surgical History  Procedure Date  . Bso secondary to cyst   . Abdominal hysterectomy   . Esophagogastroduodenoscopy 05/11/09    schatzki ring/small hiatal hernia/path:gastritis  . Colonoscopy 12/2009    Dr. Aleene Davidson, propofol, normal. Next TCS 12/2019    Allergies  Allergen Reactions  . Clarithromycin Other (See Comments)    Does not know  . Lisinopril Swelling    Current Outpatient Prescriptions  Medication Sig Dispense Refill  . acetaminophen (TYLENOL) 650 MG CR tablet Take 650 mg by mouth. As needed       . aspirin 81 MG tablet Take 81 mg by mouth daily.        . calcium carbonate (OS-CAL) 600 MG TABS Take 600 mg by mouth 2 (two) times daily with a meal.        . citalopram (CELEXA) 20 MG tablet Take 20 mg by mouth daily. Take 3 tablets by mouth once daily       . clonazePAM (KLONOPIN) 0.5 MG tablet Take 0.5 mg by mouth. As needed       . clopidogrel (PLAVIX) 75 MG tablet Take 75 mg by mouth daily.        . fexofenadine (ALLEGRA) 180 MG tablet Take 180 mg by mouth daily.        Marland Kitchen HYOSCYAMINE PO Take 0.12 mg by mouth every 4 (four) hours as needed.      . indapamide (LOZOL) 2.5 MG tablet Take 2.5 mg by mouth every morning. Take two tablets once daily       . isosorbide mononitrate (IMDUR) 30 MG 24 hr tablet  Take 30 mg by mouth daily.        Marland Kitchen liothyronine (CYTOMEL) 25 MCG tablet Take 25 mcg by mouth daily.      . metoprolol succinate (TOPROL-XL) 25 MG 24 hr tablet Take 25 mg by mouth daily. 1/2 tablet twice daily       . niacin (NIASPAN) 500 MG CR tablet Take 500 mg by mouth at bedtime.        . nitroGLYCERIN (NITROSTAT) 0.4 MG SL tablet Place 0.4 mg under the tongue every 5 (five) minutes as needed.        Marland Kitchen omeprazole (PRILOSEC) 20 MG capsule Take 20 mg by mouth daily. Gets meds otc      . polyethylene glycol (MIRALAX / GLYCOLAX) packet Take 17 g by mouth daily. using prn      . potassium chloride SA (K-DUR,KLOR-CON) 20 MEQ tablet Take 20 mEq by mouth. Take one and one half tablet twice daily       . Probiotic Product (PROBIOTIC PO) Take 1 capsule by mouth daily.        Marland Kitchen  simvastatin (ZOCOR) 40 MG tablet Take 40 mg by mouth at bedtime.        . Thiamine HCl (VITAMIN B-1) 250 MG tablet Take 250 mg by mouth daily.      Marland Kitchen tolterodine (DETROL LA) 4 MG 24 hr capsule Take 4 mg by mouth daily.           Review of Systems     Objective:   Physical Exam  Vitals reviewed. Constitutional: She is oriented to person, place, and time. She appears well-developed and well-nourished. No distress.  HENT:  Head: Normocephalic and atraumatic.  Mouth/Throat: Oropharynx is clear and moist. No oropharyngeal exudate.  Eyes: Pupils are equal, round, and reactive to light. No scleral icterus.  Neck: Normal range of motion. Neck supple.  Cardiovascular: Normal rate, regular rhythm and normal heart sounds.   Pulmonary/Chest: Effort normal and breath sounds normal. No respiratory distress.  Abdominal: Soft. Bowel sounds are normal. There is no tenderness.  Musculoskeletal: Normal range of motion. She exhibits no edema.  Lymphadenopathy:    She has no cervical adenopathy.  Neurological: She is alert and oriented to person, place, and time.          Assessment & Plan:

## 2011-06-30 NOTE — Patient Instructions (Addendum)
Use MIRALAX as needed for constipation. Use HYOSCYAMINE FOR DIARRHEA, CRAMPS, LOWER ABDOMINAL ACHINESS, OR RECTAL URGENCY AS NEEDED. CONTINUE PROBIOTICS DAILY. FOLLOW UP IN 6 MOS.  Irritable Bowel Syndrome (Spastic Colon) Irritable Bowel Syndrome (IBS) is caused by a disturbance of normal bowel function. Other terms used are spastic colon, mucous colitis, and irritable colon. It does not require surgery, nor does it lead to cancer. There is no cure for IBS. But with proper diet, stress reduction, and medication, you will find that your problems (symptoms) will gradually disappear or improve. IBS is a common digestive disorder. It usually appears in late adolescence or early adulthood. Women develop it twice as often as men.  CAUSES After food has been digested and absorbed in the small intestine, waste material is moved into the colon (large intestine). In the colon, water and salts are absorbed from the undigested products coming from the small intestine. The remaining residue, or fecal material, is held for elimination. Under normal circumstances, gentle, rhythmic contractions on the bowel walls push the fecal material along the colon towards the rectum. In IBS, however, these contractions are irregular and poorly coordinated. The fecal material is either retained too long, resulting in constipation, or expelled too soon, producing diarrhea.  SYMPTOMS  The most common symptom of IBS is pain. It is typically in the lower left side of the belly (abdomen). But it may occur anywhere in the abdomen. It can be felt as heartburn, backache, or even as a dull pain in the arms or shoulders. The pain comes from excessive bowel-muscle spasms and from the buildup of gas and fecal material in the colon. This pain:  Can range from sharp belly (abdominal) cramps to a dull, continuous ache.   Usually worsens soon after eating.   Is typically relieved by having a bowel movement or passing gas.  Abdominal pain is  usually accompanied by constipation. But it may also produce diarrhea. The diarrhea typically occurs right after a meal or upon arising in the morning. The stools are typically soft and watery. They are often flecked with secretions (mucus).  Other symptoms of IBS include:  Bloating.  Loss of appetite.   Heartburn.  Feeling sick to your stomach  (nausea).   Belching  Vomiting   Gas.     IBS may BE ASSOCIATED WITH a number of symptoms that are unrelated to the digestive system:  Fatigue.  Headaches.   Anxiety  Shortness of breath   Difficulty in concentrating.  Dizziness.   These symptoms tend to come and go.  TREATMENT A number of medications are available to help correct bowel function and/or relieve bowel spasms and abdominal pain. Among the drugs available are:  Mild, non-irritating laxatives(MIRALAX) for severe constipation and to help restore normal bowel habits.   Specific anti-diarrheal medications to treat severe or prolonged diarrhea OR to relieve intestinal cramps (HYOSCYAMINE).   CONSIDER SEEING SOMEONE FOR STRESS RELAXATION TECHNIQUES.   HOME CARE INSTRUCTIONS   Avoid foods that are high in fat or oils. Some examples [id number] cream, butter, frankfurters, sausage, and other fatty meats.   Avoid foods that have a laxative effect, such as fruit, fruit juice, and dairy products.   Cut out carbonated drinks, chewing gum, and "gassy" foods, such as beans and cabbage. This may help relieve bloating and belching.   Bran taken with plenty of liquids may help relieve constipation.   Keep track of what foods seem to trigger your symptoms.   Avoid emotionally charged situations or  circumstances that produce anxiety.   Start or continue exercising.   Get plenty of rest and sleep.

## 2011-06-30 NOTE — Assessment & Plan Note (Signed)
Sx FAIRLY WELL CONTROLLED.  Use MIRALAX as needed for constipation. Use HYOSCYAMINE FOR DIARRHEA, CRAMPS, LOWER ABDOMINAL ACHINESS, OR RECTAL URGENCY AS NEEDED. CONTINUE PROBIOTICS DAILY. FOLLOW UP IN 6 MOS. CONSIDER [facility] FOR STRESS RELAXATION/ANXIETY MANAGEMENT.

## 2011-07-01 NOTE — Progress Notes (Signed)
Faxed to PCP

## 2011-07-11 NOTE — Progress Notes (Signed)
Reminder in epic to follow up with SF in 6 months °

## 2011-12-22 ENCOUNTER — Encounter: Payer: Self-pay | Admitting: Gastroenterology

## 2012-02-16 ENCOUNTER — Ambulatory Visit: Payer: BC Managed Care – PPO | Admitting: Gastroenterology

## 2012-02-23 ENCOUNTER — Encounter: Payer: Self-pay | Admitting: Gastroenterology

## 2012-02-23 ENCOUNTER — Ambulatory Visit (INDEPENDENT_AMBULATORY_CARE_PROVIDER_SITE_OTHER): Payer: BC Managed Care – PPO | Admitting: Gastroenterology

## 2012-02-23 VITALS — BP 105/61 | HR 65 | Temp 97.3°F | Ht 65.0 in | Wt 213.4 lb

## 2012-02-23 DIAGNOSIS — K589 Irritable bowel syndrome without diarrhea: Secondary | ICD-10-CM

## 2012-02-23 DIAGNOSIS — K219 Gastro-esophageal reflux disease without esophagitis: Secondary | ICD-10-CM

## 2012-02-23 NOTE — Patient Instructions (Addendum)
Please continue your medications as before.  Please call with any changes in your GI symptoms. Otherwise, we will have you come back in six months to see Dr. Darrick Penna.

## 2012-02-23 NOTE — Assessment & Plan Note (Signed)
Doing very well. Flare about once per month for which she takes Levsin. Continue probiotics daily and miralax prn constipation. OV in six months with Dr. Darrick Penna.

## 2012-02-23 NOTE — Assessment & Plan Note (Signed)
Doing well on omeprazole.

## 2012-02-23 NOTE — Progress Notes (Signed)
Faxed to PCP

## 2012-02-23 NOTE — Progress Notes (Signed)
Primary Care Physician: Margorie John, MD  Primary Gastroenterologist:  Jonette Eva, MD   Chief Complaint  Patient presents with  . Follow-up    HPI: Courtney Rogers is a 61 y.o. female here for six month f/u. Last seen in 06/2011 by Dr. Darrick Penna. H/O IBS, GERD.  One month ago, sinusitis. Needed antibiotics. Developed more cramping and diarrhea. Went to [facility], given Lomotil. Now back at baseline. Levsin prn only, usually once a month or so. Appetite overall good. No vomiting. No heartburn. Intermittent belching. Takes Alka-Seltzer. Miralax prn only. No melena, brbpr. No weight loss.    Current Outpatient Prescriptions  Medication Sig Dispense Refill  . acetaminophen (TYLENOL) 650 MG CR tablet Take 650 mg by mouth. As needed       . aspirin 81 MG tablet Take 81 mg by mouth daily.        . calcium carbonate (OS-CAL) 600 MG TABS Take 600 mg by mouth 2 (two) times daily with a meal.        . citalopram (CELEXA) 20 MG tablet Take 20 mg by mouth daily. Take 3 tablets by mouth once daily       . clonazePAM (KLONOPIN) 0.5 MG tablet Take 0.5 mg by mouth. As needed       . clopidogrel (PLAVIX) 75 MG tablet Take 75 mg by mouth daily.        . cyclobenzaprine (FLEXERIL) 10 MG tablet Take 10 mg by mouth 3 (three) times daily as needed.      . fexofenadine (ALLEGRA) 180 MG tablet Take 180 mg by mouth daily.        Marland Kitchen gabapentin (NEURONTIN) 600 MG tablet Take 600 mg by mouth daily.       Marland Kitchen HYOSCYAMINE PO Take 0.12 mg by mouth every 4 (four) hours as needed.      . indapamide (LOZOL) 2.5 MG tablet Take 2.5 mg by mouth every morning. Take two tablets once daily       . isosorbide mononitrate (IMDUR) 30 MG 24 hr tablet Take 30 mg by mouth daily.        . metoprolol succinate (TOPROL-XL) 25 MG 24 hr tablet Take 25 mg by mouth daily. 1/2 tablet twice daily       . niacin (NIASPAN) 500 MG CR tablet Take 500 mg by mouth at bedtime.        . nitroGLYCERIN (NITROSTAT) 0.4 MG SL tablet Place 0.4 mg under  the tongue every 5 (five) minutes as needed.        Marland Kitchen omeprazole (PRILOSEC) 20 MG capsule Take 20 mg by mouth daily. Gets meds otc      . polyethylene glycol (MIRALAX / GLYCOLAX) packet Take 17 g by mouth daily. using prn      . potassium chloride SA (K-DUR,KLOR-CON) 20 MEQ tablet Take 20 mEq by mouth. Take one and one half tablet twice daily       . Probiotic Product (PROBIOTIC PO) Take 1 capsule by mouth daily.        . simvastatin (ZOCOR) 40 MG tablet Take 40 mg by mouth at bedtime.        . Thiamine HCl (VITAMIN B-1) 250 MG tablet Take 250 mg by mouth daily.      Marland Kitchen tolterodine (DETROL LA) 4 MG 24 hr capsule Take 4 mg by mouth daily.         Allergies as of 02/23/2012 - Review Complete 02/23/2012  Allergen Reaction Noted  . Clarithromycin  Other (See Comments)   . Lisinopril Swelling     ROS:  General: Negative for anorexia, weight loss, fever, chills, fatigue, weakness. ENT: Negative for hoarseness, difficulty swallowing , nasal congestion. CV: Negative for chest pain, angina, palpitations, dyspnea on exertion, peripheral edema.  Respiratory: Negative for dyspnea at rest, dyspnea on exertion, cough, sputum, wheezing.  GI: See history of present illness. GU:  Negative for dysuria, hematuria, urinary incontinence, urinary frequency, nocturnal urination.  Endo: Negative for unusual weight change.    Physical Examination:   BP 105/61  Pulse 65  Temp 97.3 F (36.3 C) (Temporal)  Ht 5\' 5"  (1.651 m)  Wt 213 lb 6.4 oz (96.798 kg)  BMI 35.51 kg/m2  General: Well-nourished, well-developed in no acute distress.  Eyes: No icterus. Mouth: Oropharyngeal mucosa moist and pink , no lesions erythema or exudate. Lungs: Clear to auscultation bilaterally.  Heart: Regular rate and rhythm, no murmurs rubs or gallops.  Abdomen: Bowel sounds are normal, nontender, nondistended, no hepatosplenomegaly or masses, no abdominal bruits or hernia , no rebound or guarding.   Extremities: No lower  extremity edema. No clubbing or deformities. Neuro: Alert and oriented x 4   Skin: Warm and dry, no jaundice.   Psych: Alert and cooperative, normal mood and affect.

## 2012-03-31 NOTE — Progress Notes (Signed)
REVIEWED.  

## 2012-07-23 ENCOUNTER — Other Ambulatory Visit: Payer: Self-pay

## 2012-07-23 MED ORDER — HYOSCYAMINE SULFATE 0.125 MG SL SUBL: 0.1250 mg | SUBLINGUAL_TABLET | SUBLINGUAL | Status: DC | PRN

## 2012-08-14 ENCOUNTER — Encounter: Payer: Self-pay | Admitting: Gastroenterology

## 2012-09-07 ENCOUNTER — Ambulatory Visit: Payer: BC Managed Care – PPO | Admitting: Gastroenterology

## 2012-10-11 ENCOUNTER — Encounter (INDEPENDENT_AMBULATORY_CARE_PROVIDER_SITE_OTHER): Payer: BC Managed Care – PPO | Admitting: Gastroenterology

## 2012-10-11 ENCOUNTER — Telehealth: Payer: Self-pay | Admitting: Gastroenterology

## 2012-10-11 DIAGNOSIS — K219 Gastro-esophageal reflux disease without esophagitis: Secondary | ICD-10-CM

## 2012-10-11 NOTE — Telephone Encounter (Signed)
Reminder in epic °

## 2012-10-11 NOTE — Telephone Encounter (Signed)
[facility] FOR OCT 2014

## 2012-10-11 NOTE — Progress Notes (Deleted)
  Subjective:    Patient ID: Courtney Rogers, female    DOB: May 30, 1950, 62 y.o.   MRN: [id number]  HPI Past Medical History  Diagnosis Date  . AMI (acute mesenteric ischemia) 2009    CAD/ no stent medically managed  . CAD (coronary artery disease)     medically managed  . Anxiety disorder   . Hyperlipidemia   . Hypertension   . Overactive bladder   . IBS (irritable bowel syndrome)   . Abdominal pain   . Dyspepsia   . Nephropathy     screening      Review of Systems     Objective:   Physical Exam        Assessment & Plan:

## 2012-10-11 NOTE — Progress Notes (Signed)
error 

## 2012-10-11 NOTE — Telephone Encounter (Signed)
Pt was a no show

## 2012-10-11 NOTE — Telephone Encounter (Signed)
REVIEWED.  

## 2012-10-21 DIAGNOSIS — I2699 Other pulmonary embolism without acute cor pulmonale: Secondary | ICD-10-CM

## 2012-10-21 HISTORY — DX: Other pulmonary embolism without acute cor pulmonale: I26.99

## 2013-01-03 ENCOUNTER — Encounter: Payer: Self-pay | Admitting: Gastroenterology

## 2013-02-18 ENCOUNTER — Telehealth: Payer: Self-pay | Admitting: Gastroenterology

## 2013-02-18 NOTE — Telephone Encounter (Signed)
Pt called this morning asking if she could see SF sooner than Thursday this week. She is aware of her OV on Thursday at 10 with SF and wants SF ONLY. I told her that SF is here Wednesday and Thursdays and nothing was available at the moment. She asked if the nurse would call her back to recommend something, [phone]

## 2013-02-18 NOTE — Telephone Encounter (Signed)
I called and spoke with Courtney Rogers. She said sometimes she has difficulty swallowing her pills, and she divides them up to not take all at once. Sometimes she gets dry heaves. I told her to keep appt with Dr. Darrick Penna on Thurs for this week and she said she will do so.

## 2013-02-21 ENCOUNTER — Ambulatory Visit (INDEPENDENT_AMBULATORY_CARE_PROVIDER_SITE_OTHER): Payer: BC Managed Care – PPO | Admitting: Gastroenterology

## 2013-02-21 ENCOUNTER — Other Ambulatory Visit: Payer: Self-pay | Admitting: Gastroenterology

## 2013-02-21 ENCOUNTER — Encounter: Payer: Self-pay | Admitting: Gastroenterology

## 2013-02-21 VITALS — BP 120/74 | HR 43 | Temp 98.2°F | Ht 65.0 in | Wt 219.0 lb

## 2013-02-21 DIAGNOSIS — R1319 Other dysphagia: Secondary | ICD-10-CM

## 2013-02-21 DIAGNOSIS — K589 Irritable bowel syndrome without diarrhea: Secondary | ICD-10-CM

## 2013-02-21 DIAGNOSIS — K219 Gastro-esophageal reflux disease without esophagitis: Secondary | ICD-10-CM

## 2013-02-21 DIAGNOSIS — R1310 Dysphagia, unspecified: Secondary | ICD-10-CM | POA: Insufficient documentation

## 2013-02-21 NOTE — Progress Notes (Signed)
CC'd to PCP 

## 2013-02-21 NOTE — Assessment & Plan Note (Addendum)
TO PILLS AND SOLID FOOD. NOW ON COUMADIN.  NEEDS EGD ON A THUR IN NOV. WILL BEE TO HOLD COUMADIN FOR 5 DAYS. OVERLAP LOVENOX FOR 4 DAYS. DISCUSSED PROCEDURE, BENEFITS, RISKS, AND MANAGEMENT OF COUMADIN FOR ENDOSCOPY. SOFT MECH DIET CONTINUE OMP OPV IN 6 MOS  TOTAL TIME FOR HPI, PE, AND DSCUSSION OF PLAN/PROCEDURE-35 MINS.

## 2013-02-21 NOTE — Telephone Encounter (Signed)
REVIEWED.  

## 2013-02-21 NOTE — Assessment & Plan Note (Signed)
SX NOT IDEALLY CONTROLLED.  LOW FAT DIET PRILOSEC DAILY OPV IN 6 MOS

## 2013-02-21 NOTE — Assessment & Plan Note (Addendum)
SX FAIRLY WELL CONTROLLED.  CONTINUE BENTYL BID/PRN. MIRALAX PRN DRINK WATER EAT FIBER OPV IN 6 MOS

## 2013-02-21 NOTE — Patient Instructions (Addendum)
FOLLOW A LOW FAT/SOFT MECHANICAL DIET. SEE INFO BELOW.  CONTINUE YOUR WEIGHT LOSS EFFORTS.  WILL SCHEDULE YOUR UPPER ENDOSCOPY TO STRETCH YOUR ESOPHAGUS IN NOV 2014.  HOLD COUMADIN 5 DAYS PRIOR TO ENDOSCOPY.  START LOVENOX 3 DAYS PRIOR TO ENDOSCOPY. HOLD LOVENOX THE EVENING PRIOR TO ENDOSCOPY.  HOLD TOPROL ON MORNING OF PROCEDURE.  Use Prilosec 30 minutes prior to your first meal.  FOLLOW UP IN 6 MOS. MERRY CHRISTMAS AND HAPPY NEW YEAR!  SOFT MECHANICAL DIET This SOFT MECHANICAL DIET is restricted to:  Foods that are moist, soft-textured, and easy to chew and swallow.   Meats that are ground or are minced no larger than one-quarter inch pieces. Meats are moist with gravy or sauce added.   Foods that do not include bread or bread-like textures except soft pancakes, well-moistened with syrup or sauce.   Textures with some chewing ability required.   Casseroles without rice.   Cooked vegetables that are less than half an inch in size and easily mashed with a fork. No cooked corn, peas, broccoli, cauliflower, cabbage, Brussels sprouts, asparagus, or other fibrous, non-tender or rubbery cooked vegetables.   Canned fruit except for pineapple. Fruit must be cut into pieces no larger than half an inch in size.   Foods that do not include nuts, seeds, coconut, or sticky textures.   FOOD TEXTURES FOR DYSPHAGIA DIET LEVEL 2 -SOFT MECHANICAL DIET (includes all foods on Dysphagia Diet Level 1 - Pureed, in addition to the foods listed below)  FOOD GROUP: Breads. RECOMMENDED: Soft pancakes, well-moistened with syrup or sauce.  AVOID: All others.  FOOD GROUP: Cereals.  RECOMMENDED: Cooked cereals with little texture, including oatmeal. Unprocessed wheat bran stirred into cereals for bulk. Note: If thin liquids are restricted, it is important that all of the liquid is absorbed into the cereal.  AVOID: All dry cereals and any cooked cereals that may contain flax seeds or other seeds or  nuts. Whole-grain, dry, or coarse cereals. Cereals with nuts, seeds, dried fruit, and/or coconut.  FOOD GROUP: Desserts. RECOMMENDED: Pudding, custard. Soft fruit pies with bottom crust only. Canned fruit (excluding pineapple). Soft, moist cakes with icing.Frozen malts, milk shakes, frozen yogurt, eggnog, nutritional supplements, ice cream, sherbet, regular or sugar-free gelatin, or any foods that become thin liquid at either room (70 F) or body temperature (98 F).  AVOID: Dry, coarse cakes and cookies. Anything with nuts, seeds, coconut, pineapple, or dried fruit. Breakfast yogurt with nuts. Rice or bread pudding.  FOOD GROUP: Fats. RECOMMENDED: Butter, margarine, cream for cereal (depending on liquid consistency recommendations), gravy, cream sauces, sour cream, sour cream dips with soft additives, mayonnaise, salad dressings, cream cheese, cream cheese spreads with soft additives, whipped toppings.  AVOID: All fats with coarse or chunky additives.  FOOD GROUP: Fruits. RECOMMENDED: Soft drained, canned, or cooked fruits without seeds or skin. Fresh soft and ripe banana. Fruit juices with a small amount of pulp. If thin liquids are restricted, fruit juices should be thickened to appropriate consistency.  AVOID: Fresh or frozen fruits. Cooked fruit with skin or seeds. Dried fruits. Fresh, canned, or cooked pineapple.  FOOD GROUP: Meats and Meat Substitutes. (Meat pieces should not exceed 1/4 of an inch cube and should be tender.) RECOMMENDED: Moistened ground or cooked meat, poultry, or fish. Moist ground or tender meat may be served with gravy or sauce. Casseroles without rice. Moist macaroni and cheese, well-cooked pasta with meat sauce, tuna noodle casserole, soft, moist lasagna. Moist meatballs, meatloaf, or fish loaf.  Protein salads, such as tuna or egg without large chunks, celery, or onion. Cottage cheese, smooth quiche without large chunks. Poached, scrambled, or soft-cooked eggs (egg  yolks should not be "runny" but should be moist and able to be mashed with butter, margarine, or other moisture added to them). (Cook eggs to 160 F or use pasteurized eggs for safety.) Souffls may have small, soft chunks. Tofu. Well-cooked, slightly mashed, moist legumes, such as baked beans. All meats or protein substitutes should be served with sauces or moistened to help maintain cohesiveness in the oral cavity.  AVOID: Dry meats, tough meats (such as bacon, sausage, hot dogs, bratwurst). Dry casseroles or casseroles with rice or large chunks. Peanut butter. Cheese slices and cubes. Hard-cooked or crisp fried eggs. Sandwiches.Pizza.  FOOD GROUP: Potatoes and Starches. RECOMMENDED: Well-cooked, moistened, boiled, baked, or mashed potatoes. Well-cooked shredded hash brown potatoes that are not crisp. (All potatoes need to be moist and in sauces.)Well-cooked noodles in sauce. Spaetzel or soft dumplings that have been moistened with butter or gravy.  AVOID: Potato skins and chips. Fried or French-fried potatoes. Rice.  FOOD GROUP: Soups. RECOMMENDED: Soups with easy-to-chew or easy-to-swallow meats or vegetables: Particle sizes in soups should be less than 1/2 inch. Soups will need to be thickened to appropriate consistency if soup is thinner than prescribed liquid consistency.  AVOID: Soups with large chunks of meat and vegetables. Soups with rice, corn, peas.  FOOD GROUP: Vegetables. RECOMMENDED: All soft, well-cooked vegetables. Vegetables should be less than a half inch. Should be easily mashed with a fork.  AVOID: Cooked corn and peas. Broccoli, cabbage, Brussels sprouts, asparagus, or other fibrous, non-tender or rubbery cooked vegetables.  FOOD GROUP: Miscellaneous. RECOMMENDED: Jams and preserves without seeds, jelly. Sauces, salsas, etc., that may have small tender chunks less than 1/2 inch. Soft, smooth chocolate bars that are easily chewed.  AVOID: Seeds, nuts, coconut, or sticky  foods. Chewy candies such as caramels or licorice.   Low-Fat Diet BREADS, CEREALS, PASTA, RICE, DRIED PEAS, AND BEANS These products are high in carbohydrates and most are low in fat. Therefore, they can be increased in the diet as substitutes for fatty foods. They too, however, contain calories and should not be eaten in excess. Cereals can be eaten for snacks as well as for breakfast.   FRUITS AND VEGETABLES It is good to eat fruits and vegetables. Besides being sources of fiber, both are rich in vitamins and some minerals. They help you get the daily allowances of these nutrients. Fruits and vegetables can be used for snacks and desserts.  MEATS Limit lean meat, chicken, [place], and fish to no more than 6 ounces per day. Beef, Pork, and Lamb Use lean cuts of beef, pork, and lamb. Lean cuts include:  Extra-lean ground beef.  Arm roast.  Sirloin tip.  Center-cut ham.  Round steak.  Loin chops.  Rump roast.  Tenderloin.  Trim all fat off the outside of meats before cooking. It is not necessary to severely decrease the intake of red meat, but lean choices should be made. Lean meat is rich in protein and contains a highly absorbable form of iron. Premenopausal women, in particular, should avoid reducing lean red meat because this could increase the risk for low red blood cells (iron-deficiency anemia).  Chicken and [place] These are good sources of protein. The fat of poultry can be reduced by removing the skin and underlying fat layers before cooking. Chicken and [place] can be substituted for lean red meat  in the diet. Poultry should not be fried or covered with high-fat sauces. Fish and Shellfish Fish is a good source of protein. Shellfish contain cholesterol, but they usually are low in saturated fatty acids. The preparation of fish is important. Like chicken and [place], they should not be fried or covered with high-fat sauces. EGGS Egg whites contain no fat or cholesterol. They can be  eaten often. Try 1 to 2 egg whites instead of whole eggs in recipes or use egg substitutes that do not contain yolk. MILK AND DAIRY PRODUCTS Use skim or 1% milk instead of 2% or whole milk. Decrease whole milk, natural, and processed cheeses. Use nonfat or low-fat (2%) cottage cheese or low-fat cheeses made from vegetable oils. Choose nonfat or low-fat (1 to 2%) yogurt. Experiment with evaporated skim milk in recipes that call for heavy cream. Substitute low-fat yogurt or low-fat cottage cheese for sour cream in dips and salad dressings. Have at least 2 servings of low-fat dairy products, such as 2 glasses of skim (or 1%) milk each day to help get your daily calcium intake. FATS AND OILS Reduce the total intake of fats, especially saturated fat. Butterfat, lard, and beef fats are high in saturated fat and cholesterol. These should be avoided as much as possible. Vegetable fats do not contain cholesterol, but certain vegetable fats, such as coconut oil, palm oil, and palm kernel oil are very high in saturated fats. These should be limited. These fats are often used in bakery goods, processed foods, popcorn, oils, and nondairy creamers. Vegetable shortenings and some peanut butters contain hydrogenated oils, which are also saturated fats. Read the labels on these foods and check for saturated vegetable oils. Unsaturated vegetable oils and fats do not raise blood cholesterol. However, they should be limited because they are fats and are high in calories. Total fat should still be limited to 30% of your daily caloric intake. Desirable liquid vegetable oils are corn oil, cottonseed oil, olive oil, canola oil, safflower oil, soybean oil, and sunflower oil. Peanut oil is not as good, but small amounts are acceptable. Buy a heart-healthy tub margarine that has no partially hydrogenated oils in the ingredients. Mayonnaise and salad dressings often are made from unsaturated fats, but they should also be limited because  of their high calorie and fat content. Seeds, nuts, peanut butter, olives, and avocados are high in fat, but the fat is mainly the unsaturated type. These foods should be limited mainly to avoid excess calories and fat. OTHER EATING TIPS Snacks  Most sweets should be limited as snacks. They tend to be rich in calories and fats, and their caloric content outweighs their nutritional value. Some good choices in snacks are graham crackers, melba toast, soda crackers, bagels (no egg), English muffins, fruits, and vegetables. These snacks are preferable to snack crackers, [place] fries, TORTILLA CHIPS, and POTATO chips. Popcorn should be air-popped or cooked in small amounts of liquid vegetable oil. Desserts Eat fruit, low-fat yogurt, and fruit ices instead of pastries, cake, and cookies. Sherbet, angel food cake, gelatin dessert, frozen low-fat yogurt, or other frozen products that do not contain saturated fat (pure fruit juice bars, frozen ice pops) are also acceptable.  COOKING METHODS Choose those methods that use little or no fat. They include: Poaching.  Braising.  Steaming.  Grilling.  Baking.  Stir-frying.  Broiling.  Microwaving.  Foods can be cooked in a nonstick pan without added fat, or use a nonfat cooking spray in regular cookware. Limit fried  foods and avoid frying in saturated fat. Add moisture to lean meats by using water, broth, cooking wines, and other nonfat or low-fat sauces along with the cooking methods mentioned above. Soups and stews should be chilled after cooking. The fat that forms on top after a few hours in the refrigerator should be skimmed off. When preparing meals, avoid using excess salt. Salt can contribute to raising blood pressure in some people.  EATING AWAY FROM HOME Order entres, potatoes, and vegetables without sauces or butter. When meat exceeds the size of a deck of cards (3 to 4 ounces), the rest can be taken home for another meal. Choose vegetable or fruit  salads and ask for low-calorie salad dressings to be served on the side. Use dressings sparingly. Limit high-fat toppings, such as bacon, crumbled eggs, cheese, sunflower seeds, and olives. Ask for heart-healthy tub margarine instead of butter.

## 2013-02-21 NOTE — Progress Notes (Signed)
Subjective:    Patient ID: Courtney Rogers, female    DOB: Aug 23, 1950, 62 y.o.   MRN: [id number]  CLAGGETT,ELIN, PA-C  HPI 2010: EGD FOR DYSPEPSIA-SCHATZKI'S RING. 2012: 203 LBS. JUN 2014: HAD A BLOOD COUNT IN HER LUNGS. WAS ON COUMADIN AND THEN XARELTO. COULDN'T TOLERATE XARELTO. NOW ON COUMADIN. ALSO HAVING DIZZINESS. CAN FEEL NAUSEATED WHEN SOMETHING TRIES TO COME UP. FEELS LIKE PILLS GET STUCK AND SOMETIMES WHEN SHE'S EATING IF SHE EATS THE FULLNESS WILL GET WORSE. BMs: CONSTIPATION: RX MIRALAX PRN. MAY HAVE CRAMPY ABD PAIN BUT NO DIARRHEA. PT DENIES FEVER, CHILLS, BRBPR, vomiting, melena, diarrhea, constipation, abd pain, OR problems with sedation. DOESN'T FEEL heartburn or indigestion.IT FEELS MORE LIKE A CHEST PRESSURE. NO WEIGHT LOSS.  Past Medical History  Diagnosis Date  . AMI (acute mesenteric ischemia) 2009    CAD/ no stent medically managed  . CAD (coronary artery disease)     medically managed  . Anxiety disorder   . Hyperlipidemia   . Hypertension   . Overactive bladder   . IBS (irritable bowel syndrome)   . Abdominal pain   . Dyspepsia   . Nephropathy     screening   Past Surgical History  Procedure Laterality Date  . Bso secondary to cyst    . Abdominal hysterectomy    . Esophagogastroduodenoscopy  05/11/09    schatzki ring/small hiatal hernia/path:gastritis  . Colonoscopy  12/2009    Dr. Aleene Davidson, propofol, normal. Next TCS 12/2019   Allergies  Allergen Reactions  . Clarithromycin Other (See Comments)    Does not know  . Lisinopril Swelling   Current Outpatient Prescriptions  Medication Sig Dispense Refill  . aspirin 81 MG tablet Take 81 mg by mouth daily.        . B Complex-C (B-COMPLEX WITH VITAMIN C) tablet Take 1 tablet by mouth daily.      . calcium carbonate (OS-CAL) 600 MG TABS Take 600 mg by mouth 2 (two) times daily with a meal.        . dicyclomine (BENTYL) 20 MG tablet Take 20 mg by mouth every 6 (six) hours. BID AND AS NEEDED    .  fexofenadine (ALLEGRA) 180 MG tablet Take 180 mg by mouth daily.        Marland Kitchen gabapentin (NEURONTIN) 600 MG tablet Take 300 mg by mouth 3 (three) times daily.       . indapamide (LOZOL) 2.5 MG tablet Take 2.5 mg by mouth every morning. Take two tablets once daily       . isosorbide mononitrate (IMDUR) 30 MG 24 hr tablet Take 30 mg by mouth daily.        . meclizine (ANTIVERT) 25 MG tablet Take 25 mg by mouth 3 (three) times daily as needed.      . metoprolol succinate (TOPROL-XL) 25 MG 24 hr tablet Take 25 mg by mouth daily. 1/2 tablet twice daily       . montelukast (SINGULAIR) 10 MG tablet Take 10 mg by mouth at bedtime.      . niacin (NIASPAN) 500 MG CR tablet Take 500 mg by mouth at bedtime.        . nitroGLYCERIN (NITROSTAT) 0.4 MG SL tablet Place 0.4 mg under the tongue every 5 (five) minutes as needed.        Marland Kitchen omeprazole (PRILOSEC) 20 MG capsule Take 20 mg by mouth daily. Gets meds otc      . potassium chloride SA (K-DUR,KLOR-CON) 20 MEQ tablet Take 20  mEq by mouth. Take one and one half tablet twice daily       . Probiotic Product (PROBIOTIC PO) Take 1 capsule by mouth daily.        . sertraline (ZOLOFT) 100 MG tablet Take 100 mg by mouth daily.      . simvastatin (ZOCOR) 40 MG tablet Take 40 mg by mouth at bedtime.        . tolterodine (DETROL LA) 4 MG 24 hr capsule Take 4 mg by mouth daily.       Marland Kitchen warfarin (COUMADIN) 5 MG tablet Take 5 mg by mouth daily.      . polyethylene glycol (MIRALAX / GLYCOLAX) packet Take 17 g by mouth daily. using prn           Review of Systems     Objective:   Physical Exam  Vitals reviewed. Constitutional: She is oriented to person, place, and time. She appears well-nourished. No distress.  HENT:  Head: Normocephalic and atraumatic.  Mouth/Throat: Oropharynx is clear and moist. No oropharyngeal exudate.  Eyes: Pupils are equal, round, and reactive to light. No scleral icterus.  Neck: Normal range of motion. Neck supple.  Cardiovascular: Normal  rate, regular rhythm and normal heart sounds.   Pulmonary/Chest: Effort normal and breath sounds normal. No respiratory distress.  Abdominal: Soft. Bowel sounds are normal. She exhibits no distension. There is no tenderness.  Musculoskeletal: She exhibits no edema.  Lymphadenopathy:    She has no cervical adenopathy.  Neurological: She is alert and oriented to person, place, and time.  NO FOCAL DEFICITS   Psychiatric: She has a normal mood and affect.          Assessment & Plan:

## 2013-02-22 NOTE — Progress Notes (Signed)
Reminder in epic °

## 2013-03-19 ENCOUNTER — Encounter (HOSPITAL_COMMUNITY): Payer: Self-pay | Admitting: Pharmacy Technician

## 2013-03-30 ENCOUNTER — Emergency Department (HOSPITAL_COMMUNITY)
Admission: EM | Admit: 2013-03-30 | Discharge: 2013-03-30 | Disposition: A | Discharge status: Home / Self Care | Payer: BC Managed Care – PPO | Attending: Emergency Medicine | Admitting: Emergency Medicine

## 2013-03-30 ENCOUNTER — Encounter (HOSPITAL_COMMUNITY): Payer: Self-pay | Admitting: Emergency Medicine

## 2013-03-30 DIAGNOSIS — Z7982 Long term (current) use of aspirin: Secondary | ICD-10-CM | POA: Insufficient documentation

## 2013-03-30 DIAGNOSIS — J3489 Other specified disorders of nose and nasal sinuses: Secondary | ICD-10-CM | POA: Insufficient documentation

## 2013-03-30 DIAGNOSIS — F411 Generalized anxiety disorder: Secondary | ICD-10-CM | POA: Insufficient documentation

## 2013-03-30 DIAGNOSIS — Z79899 Other long term (current) drug therapy: Secondary | ICD-10-CM | POA: Insufficient documentation

## 2013-03-30 DIAGNOSIS — Z8669 Personal history of other diseases of the nervous system and sense organs: Secondary | ICD-10-CM | POA: Insufficient documentation

## 2013-03-30 DIAGNOSIS — R238 Other skin changes: Secondary | ICD-10-CM

## 2013-03-30 DIAGNOSIS — Z7901 Long term (current) use of anticoagulants: Secondary | ICD-10-CM | POA: Insufficient documentation

## 2013-03-30 DIAGNOSIS — I10 Essential (primary) hypertension: Secondary | ICD-10-CM | POA: Insufficient documentation

## 2013-03-30 DIAGNOSIS — Z792 Long term (current) use of antibiotics: Secondary | ICD-10-CM | POA: Insufficient documentation

## 2013-03-30 DIAGNOSIS — I2510 Atherosclerotic heart disease of native coronary artery without angina pectoris: Secondary | ICD-10-CM | POA: Insufficient documentation

## 2013-03-30 DIAGNOSIS — T888XXA Other specified complications of surgical and medical care, not elsewhere classified, initial encounter: Secondary | ICD-10-CM | POA: Insufficient documentation

## 2013-03-30 DIAGNOSIS — Z8719 Personal history of other diseases of the digestive system: Secondary | ICD-10-CM | POA: Insufficient documentation

## 2013-03-30 DIAGNOSIS — E785 Hyperlipidemia, unspecified: Secondary | ICD-10-CM | POA: Insufficient documentation

## 2013-03-30 DIAGNOSIS — Z86711 Personal history of pulmonary embolism: Secondary | ICD-10-CM | POA: Insufficient documentation

## 2013-03-30 DIAGNOSIS — Y849 Medical procedure, unspecified as the cause of abnormal reaction of the patient, or of later complication, without mention of misadventure at the time of the procedure: Secondary | ICD-10-CM | POA: Insufficient documentation

## 2013-03-30 LAB — CBC WITH DIFFERENTIAL/PLATELET
Basophils Absolute: 0 10*3/uL (ref 0.0–0.1)
Basophils Relative: 0 % (ref 0–1)
Eosinophils Absolute: 0.2 10*3/uL (ref 0.0–0.7)
Eosinophils Relative: 1 % (ref 0–5)
HCT: 36.6 % (ref 36.0–46.0)
Hemoglobin: 11.9 g/dL — ABNORMAL LOW (ref 12.0–15.0)
Lymphocytes Relative: 30 % (ref 12–46)
Lymphs Abs: 4.6 10*3/uL — ABNORMAL HIGH (ref 0.7–4.0)
MCH: 29.8 pg (ref 26.0–34.0)
MCHC: 32.5 g/dL (ref 30.0–36.0)
MCV: 91.5 fL (ref 78.0–100.0)
Monocytes Absolute: 1.5 10*3/uL — ABNORMAL HIGH (ref 0.1–1.0)
Monocytes Relative: 10 % (ref 3–12)
Neutro Abs: 9 10*3/uL — ABNORMAL HIGH (ref 1.7–7.7)
Neutrophils Relative %: 59 % (ref 43–77)
Platelets: 211 10*3/uL (ref 150–400)
RBC: 4 MIL/uL (ref 3.87–5.11)
RDW: 15.7 % — ABNORMAL HIGH (ref 11.5–15.5)
WBC: 15.4 10*3/uL — ABNORMAL HIGH (ref 4.0–10.5)

## 2013-03-30 NOTE — ED Provider Notes (Signed)
CSN: [id number]     Arrival date & time 03/30/13  1526 History   First MD Initiated Contact with Patient 03/30/13 1657     This chart was scribed for Shelda Jakes, MD by Manuela Schwartz, ED scribe. This patient was seen in room APA03/APA03 and the patient's care was started at 1657.  Chief Complaint  Patient presents with  . Bleeding/Bruising   Patient is a 62 y.o. female presenting with general illness. The history is provided by the patient. No language interpreter was used.  Illness Location:  RLQ abdomen Quality:  Large area of bruising which is minimally painful/tender Severity:  Moderate Onset quality:  Gradual Duration:  1 day Timing:  Constant Progression:  Unchanged Chronicity:  New Context:  Recently began taking lovenox shots yesterday Relieved by:  Nothing Ineffective treatments:  None Associated symptoms: abdominal pain and congestion   Associated symptoms: no chest pain, no cough, no diarrhea, no fever, no headaches, no loss of consciousness, no nausea, no rash, no rhinorrhea, no shortness of breath, no sore throat and no vomiting    HPI Comments: Courtney Rogers is a 62 y.o. female who presents to the Emergency Department complaining of gradual onset RLQ abdominal bruising since this AM associated w/lovenox shots which she began taking yesterday to prepare for an upper endoscopy of her right lung in which she had a PE on May 3rd of 2014 (she was on coumadin before she began lovenox shots). She states is concerned to the large size of bruising over her abdomen (which is minimally tender/painful) and denies any blunt trauma/injury to her abdomen.   She states some recent congestion which she took some mucinex for but denies any other recent illnesses.   She denies any fever/chills, visual changes, HA, cough, rhinorrhea, sore throat, HA, SOB, nausea, emesis, diarrhea, pitting edema, neck/back pain and no rash.   Her PCP is Dr. Donnie Mesa.  Past Medical History   Diagnosis Date  . AMI (acute mesenteric ischemia) 2009    CAD/ no stent medically managed  . CAD (coronary artery disease)     medically managed  . Anxiety disorder   . Hyperlipidemia   . Hypertension   . Overactive bladder   . IBS (irritable bowel syndrome)   . Abdominal pain   . Dyspepsia   . Nephropathy     screening  . PE (pulmonary embolism) JUN 2014   Past Surgical History  Procedure Laterality Date  . Bso secondary to cyst    . Abdominal hysterectomy    . Esophagogastroduodenoscopy  05/11/09    schatzki ring/small hiatal hernia/path:gastritis  . Colonoscopy  12/2009    Dr. Aleene Davidson, propofol, normal. Next TCS 12/2019   Family History  Problem Relation Age of Onset  . Colon polyps Neg Hx   . Colon cancer Neg Hx    History  Substance Use Topics  . Smoking status: Never Smoker   . Smokeless tobacco: Not on file  . Alcohol Use: No   OB History   Grav Para Term Preterm Abortions TAB SAB Ect Mult Living                 Review of Systems  Constitutional: Negative for fever and chills.  HENT: Positive for congestion. Negative for rhinorrhea and sore throat.   Eyes: Negative for visual disturbance.  Respiratory: Negative for cough and shortness of breath.   Cardiovascular: Negative for chest pain and leg swelling.  Gastrointestinal: Positive for abdominal pain. Negative for nausea, vomiting  and diarrhea.  Genitourinary: Negative for dysuria.  Musculoskeletal: Negative for back pain and neck pain.  Skin: Negative for rash.  Neurological: Negative for loss of consciousness, syncope, weakness and headaches.  Hematological: Bruises/bleeds easily.  All other systems reviewed and are negative.   A complete 10 system review of systems was obtained and all systems are negative except as noted in the HPI and PMH.   Allergies  Biaxin and Lisinopril  Home Medications   Current Outpatient Rx  Name  Route  Sig  Dispense  Refill  . metoprolol succinate (TOPROL-XL) 25  MG 24 hr tablet   Oral   Take 12.5 mg by mouth 2 (two) times daily.          . simvastatin (ZOCOR) 40 MG tablet   Oral   Take 40 mg by mouth at bedtime.           Marland Kitchen warfarin (COUMADIN) 5 MG tablet   Oral   Take 2.5-5 mg by mouth daily. 1/2 tablet (2.5) Monday through fri. Take whole tablet on Saturday and Sunday.         Marland Kitchen albuterol (PROVENTIL HFA;VENTOLIN HFA) 108 (90 BASE) MCG/ACT inhaler   Inhalation   Inhale 2 puffs into the lungs every 6 (six) hours as needed for shortness of breath.         Marland Kitchen aspirin 81 MG tablet   Oral   Take 81 mg by mouth daily.           . B Complex-C (B-COMPLEX WITH VITAMIN C) tablet   Oral   Take 1 tablet by mouth daily.         . calcium carbonate (OS-CAL) 600 MG TABS   Oral   Take 600 mg by mouth 2 (two) times daily with a meal.          . dicyclomine (BENTYL) 20 MG tablet   Oral   Take 20 mg by mouth 2 (two) times daily.          . fexofenadine (ALLEGRA) 180 MG tablet   Oral   Take 180 mg by mouth daily.           Marland Kitchen gabapentin (NEURONTIN) 300 MG capsule   Oral   Take 300 mg by mouth 3 (three) times daily.         . hyoscyamine (LEVSIN SL) 0.125 MG SL tablet   Sublingual   Place 0.125 mg under the tongue every 4 (four) hours as needed for cramping.         . indapamide (LOZOL) 2.5 MG tablet   Oral   Take 2.5 mg by mouth every morning. Take two tablets once daily          . isosorbide mononitrate (IMDUR) 30 MG 24 hr tablet   Oral   Take 30 mg by mouth daily.           Marland Kitchen levofloxacin (LEVAQUIN) 500 MG tablet   Oral   Take 500 mg by mouth daily.         . meclizine (ANTIVERT) 25 MG tablet   Oral   Take 25 mg by mouth daily.          . montelukast (SINGULAIR) 10 MG tablet   Oral   Take 10 mg by mouth at bedtime.         . niacin (NIASPAN) 1000 MG CR tablet   Oral   Take 1,000 mg by mouth at bedtime.         Marland Kitchen  nitroGLYCERIN (NITROSTAT) 0.4 MG SL tablet   Sublingual   Place 0.4 mg under  the tongue every 5 (five) minutes as needed.           Marland Kitchen omeprazole (PRILOSEC) 20 MG capsule   Oral   Take 20 mg by mouth daily. Gets meds otc         . polyethylene glycol (MIRALAX / GLYCOLAX) packet   Oral   Take 17 g by mouth daily. using prn         . potassium chloride SA (K-DUR,KLOR-CON) 20 MEQ tablet   Oral   Take 20 mEq by mouth. Take one and one half tablet twice daily          . Probiotic Product (PROBIOTIC PO)   Oral   Take 1 capsule by mouth daily.           . sertraline (ZOLOFT) 100 MG tablet   Oral   Take 100 mg by mouth daily.         Marland Kitchen tolterodine (DETROL LA) 4 MG 24 hr capsule   Oral   Take 4 mg by mouth daily.           Triage Vitals: BP 133/77  Pulse 93  Temp(Src) 97.7 F (36.5 C) (Oral)  Ht 5\' 5"  (1.651 m)  Wt 219 lb (99.338 kg)  BMI 36.44 kg/m2  SpO2 99% Physical Exam  Nursing note and vitals reviewed. Constitutional: She is oriented to person, place, and time. She appears well-developed and well-nourished. No distress.  HENT:  Head: Normocephalic and atraumatic.  Eyes: Conjunctivae and EOM are normal. Right eye exhibits no discharge. Left eye exhibits no discharge.  Neck: Neck supple. No tracheal deviation present.  Cardiovascular: Normal rate, regular rhythm and normal heart sounds.   No murmur heard. Pulmonary/Chest: Effort normal and breath sounds normal. No respiratory distress. She has no wheezes. She has no rales.  Abdominal: Soft. Bowel sounds are normal. She exhibits no distension and no mass. There is no tenderness.  Area of bruising over RLQ of her abdomen about 15 cm by 7cm, no palpable hematoma to this area.    Musculoskeletal: Normal range of motion. She exhibits no edema and no tenderness.  Neurological: She is alert and oriented to person, place, and time. No cranial nerve deficit.  Good sensation/strength throughout.   Skin: Skin is warm and dry.  Psychiatric: She has a normal mood and affect. Her behavior is  normal.    ED Course  Procedures (including critical care time) DIAGNOSTIC STUDIES: Oxygen Saturation is 99% on room air, normal by my interpretation.    COORDINATION OF CARE: At 515 PM Discussed treatment plan with patient which includes CBC. Patient agrees.   Labs Review Labs Reviewed  CBC WITH DIFFERENTIAL - Abnormal; Notable for the following:    WBC 15.4 (*)    Hemoglobin 11.9 (*)    RDW 15.7 (*)    Neutro Abs 9.0 (*)    Lymphs Abs 4.6 (*)    Monocytes Absolute 1.5 (*)    All other components within normal limits   Results for orders placed during the hospital encounter of 03/30/13  CBC WITH DIFFERENTIAL      Result Value Range   WBC 15.4 (*) 4.0 - 10.5 K/uL   RBC 4.00  3.87 - 5.11 MIL/uL   Hemoglobin 11.9 (*) 12.0 - 15.0 g/dL   HCT 56.2  13.0 - 86.5 %   MCV 91.5  78.0 - 100.0 fL  MCH 29.8  26.0 - 34.0 pg   MCHC 32.5  30.0 - 36.0 g/dL   RDW 16.1 (*) 09.6 - 04.5 %   Platelets 211  150 - 400 K/uL   Neutrophils Relative % 59  43 - 77 %   Neutro Abs 9.0 (*) 1.7 - 7.7 K/uL   Lymphocytes Relative 30  12 - 46 %   Lymphs Abs 4.6 (*) 0.7 - 4.0 K/uL   Monocytes Relative 10  3 - 12 %   Monocytes Absolute 1.5 (*) 0.1 - 1.0 K/uL   Eosinophils Relative 1  0 - 5 %   Eosinophils Absolute 0.2  0.0 - 0.7 K/uL   Basophils Relative 0  0 - 1 %   Basophils Absolute 0.0  0.0 - 0.1 K/uL    Imaging Review No results found.  EKG Interpretation   None       MDM   1. Easy bruising    Patient's CBC without significant abnormalities no anemia is moderate leukocytosis. Platelet counts are normal. Bruising from the Lovenox without evidence of hematoma. Patient will continue Lovenox as per the plan for her procedure on Monday. Patient without any evidence of any significant problems related to the Lovenox at this point.    I personally performed the services described in this documentation, which was scribed in my presence. The recorded information has been reviewed and is  accurate.       Shelda Jakes, MD 03/30/13 [phone]

## 2013-03-30 NOTE — ED Notes (Signed)
Pt is concerned of Bruising to left lower abdomen after beginning Lovenox shots yesterday. Pt is concerned because the amount of bruising is large

## 2013-04-01 ENCOUNTER — Encounter (HOSPITAL_COMMUNITY)
Admission: RE | Disposition: A | Discharge status: Home / Self Care | Payer: Self-pay | Source: Ambulatory Visit | Attending: Gastroenterology

## 2013-04-01 ENCOUNTER — Encounter (HOSPITAL_COMMUNITY): Payer: Self-pay | Admitting: *Deleted

## 2013-04-01 ENCOUNTER — Ambulatory Visit (HOSPITAL_COMMUNITY)
Admission: RE | Admit: 2013-04-01 | Discharge: 2013-04-01 | Disposition: A | Discharge status: Home / Self Care | Payer: BC Managed Care – PPO | Source: Ambulatory Visit | Attending: Gastroenterology | Admitting: Gastroenterology

## 2013-04-01 DIAGNOSIS — D131 Benign neoplasm of stomach: Secondary | ICD-10-CM | POA: Insufficient documentation

## 2013-04-01 DIAGNOSIS — R1310 Dysphagia, unspecified: Secondary | ICD-10-CM

## 2013-04-01 DIAGNOSIS — I2699 Other pulmonary embolism without acute cor pulmonale: Secondary | ICD-10-CM | POA: Insufficient documentation

## 2013-04-01 DIAGNOSIS — R1319 Other dysphagia: Secondary | ICD-10-CM

## 2013-04-01 DIAGNOSIS — K222 Esophageal obstruction: Secondary | ICD-10-CM | POA: Insufficient documentation

## 2013-04-01 DIAGNOSIS — K3189 Other diseases of stomach and duodenum: Secondary | ICD-10-CM | POA: Insufficient documentation

## 2013-04-01 DIAGNOSIS — D1330 Benign neoplasm of unspecified part of small intestine: Secondary | ICD-10-CM

## 2013-04-01 DIAGNOSIS — Z7901 Long term (current) use of anticoagulants: Secondary | ICD-10-CM | POA: Insufficient documentation

## 2013-04-01 DIAGNOSIS — K2990 Gastroduodenitis, unspecified, without bleeding: Secondary | ICD-10-CM

## 2013-04-01 DIAGNOSIS — K2970 Gastritis, unspecified, without bleeding: Secondary | ICD-10-CM

## 2013-04-01 DIAGNOSIS — I10 Essential (primary) hypertension: Secondary | ICD-10-CM | POA: Insufficient documentation

## 2013-04-01 DIAGNOSIS — K2940 Chronic atrophic gastritis without bleeding: Secondary | ICD-10-CM | POA: Insufficient documentation

## 2013-04-01 HISTORY — PX: ESOPHAGOGASTRODUODENOSCOPY (EGD) WITH ESOPHAGEAL DILATION: SHX5812

## 2013-04-01 SURGERY — ESOPHAGOGASTRODUODENOSCOPY (EGD) WITH ESOPHAGEAL DILATION
Anesthesia: Moderate Sedation

## 2013-04-01 MED ORDER — MIDAZOLAM HCL 5 MG/5ML IJ SOLN
INTRAMUSCULAR | Status: AC
  Filled 2013-04-01: qty 10

## 2013-04-01 MED ORDER — MIDAZOLAM HCL 5 MG/5ML IJ SOLN
INTRAMUSCULAR | Status: DC | PRN
  Administered 2013-04-01 (×2): 2 mg via INTRAVENOUS

## 2013-04-01 MED ORDER — STERILE WATER FOR IRRIGATION IR SOLN
Status: DC | PRN
  Administered 2013-04-01: 09:00:00

## 2013-04-01 MED ORDER — MEPERIDINE HCL 100 MG/ML IJ SOLN
INTRAMUSCULAR | Status: DC | PRN
  Administered 2013-04-01 (×2): 25 mg via INTRAVENOUS

## 2013-04-01 MED ORDER — MEPERIDINE HCL 100 MG/ML IJ SOLN
INTRAMUSCULAR | Status: AC
  Filled 2013-04-01: qty 2

## 2013-04-01 MED ORDER — BUTAMBEN-TETRACAINE-BENZOCAINE 2-2-14 % EX AERO
INHALATION_SPRAY | CUTANEOUS | Status: DC | PRN
  Administered 2013-04-01: 2 via TOPICAL

## 2013-04-01 MED ORDER — SODIUM CHLORIDE 0.9 % IV SOLN
INTRAVENOUS | Status: DC
  Administered 2013-04-01: 08:00:00 via INTRAVENOUS

## 2013-04-01 MED ORDER — MINERAL OIL PO OIL
TOPICAL_OIL | ORAL | Status: AC
  Filled 2013-04-01: qty 30

## 2013-04-01 NOTE — Op Note (Signed)
[facility] [address] [place], [address]   ENDOSCOPY PROCEDURE REPORT  PATIENT: Courtney Rogers, Courtney Rogers  MR#: [id number] BIRTHDATE: [birth year] , 62  yrs. old GENDER: Female  ENDOSCOPIST: Jonette Eva, MD REFFERED AO:ZHYQ Claggett, PA-C  PROCEDURE DATE:  04/01/2013 PROCEDURE:   EGD with biopsy and EGD with dilatation over guidewire  INDICATIONS:1.  dysphagia.   2.  dyspepsia.  on coumadin WITH LOVENOX BRIDGE for PE MEDICATIONS: Demerol 50 mg IV and Versed 4 mg IV TOPICAL ANESTHETIC: Cetacaine Spray  DESCRIPTION OF PROCEDURE:   After the risks benefits and alternatives of the procedure were thoroughly explained, informed consent was obtained.  The EG-[id number] ([id number])  endoscope was introduced through the mouth and advanced to the second portion of the duodenum. The instrument was slowly withdrawn as the mucosa was carefully examined.  Prior to withdrawal of the scope, the guidwire was placed.  The esophagus was dilated successfully.  The patient was recovered in endoscopy and discharged home in satisfactory condition.   ESOPHAGUS: A stricture was found at the gastroesophageal junction. The stenosis was traversable with the endoscope.   STOMACH: Multiple small sessile polyps were found in the gastric body and gastric fundus.  Multiple biopsies was performed using cold forceps.   Mild non-erosive gastritis (inflammation) was found in the gastric antrum.  Multiple biopsies were performed using cold forceps.   DUODENUM: The duodenal mucosa showed no abnormalities in the bulb and second portion of the duodenum.   Dilation was then performed at the gastroesphageal junction Dilator: Savary over guidewire Size(s): 14-16 MM Resistance: minimal Heme: none  COMPLICATIONS: There were no complications.  ENDOSCOPIC IMPRESSION: 1.   Stricture at the gastroesophageal junction 2.   Multiple small sessile polyps  in the gastric body and gastric fundus 3.   MILD Non-erosive  gastritis  RECOMMENDATIONS: CONTINUE OMEPRAZOLE.  TAKE 30 MINUTES PRIOR TO YOUR FIRST MEAL DAILY FOREVER. HOLD COUMADIN.  RESTART NOV 12. HOLD LOVENOX.  RESTART NOV 11.  STOP NOV 14. BIOPSY WILL BE BACK IN 7 DAYS.  FOLLOW UP IN MAR 2015. PT NEEDS BPE IF SX NOT RESOLVED.      _______________________________ Rosalie DoctorJonette Eva, MD 04/01/2013 3:57 PM

## 2013-04-01 NOTE — H&P (Signed)
Primary Care Physician:  Alleen Borne Primary Gastroenterologist:  Dr. Darrick Penna  Pre-Procedure History & Physical: HPI:  Courtney Rogers is a 62 y.o. female here for DYSPHAGIA.  Past Medical History  Diagnosis Date  . AMI (acute mesenteric ischemia) 2009    CAD/ no stent medically managed  . CAD (coronary artery disease)     medically managed  . Anxiety disorder   . Hyperlipidemia   . Hypertension   . Overactive bladder   . IBS (irritable bowel syndrome)   . Abdominal pain   . Dyspepsia   . Nephropathy     screening  . PE (pulmonary embolism) JUN 2014    Past Surgical History  Procedure Laterality Date  . Bso secondary to cyst    . Abdominal hysterectomy    . Esophagogastroduodenoscopy  05/11/09    schatzki ring/small hiatal hernia/path:gastritis  . Colonoscopy  12/2009    Dr. Aleene Davidson, propofol, normal. Next TCS 12/2019    Prior to Admission medications   Medication Sig Start Date End Date Taking? Authorizing Provider  albuterol (PROVENTIL HFA;VENTOLIN HFA) 108 (90 BASE) MCG/ACT inhaler Inhale 2 puffs into the lungs every 6 (six) hours as needed for shortness of breath.   Yes Historical Provider, MD  aspirin 81 MG tablet Take 81 mg by mouth daily.     Yes Historical Provider, MD  B Complex-C (B-COMPLEX WITH VITAMIN C) tablet Take 1 tablet by mouth daily.   Yes Historical Provider, MD  calcium carbonate (OS-CAL) 600 MG TABS Take 600 mg by mouth 2 (two) times daily with a meal.    Yes Historical Provider, MD  dicyclomine (BENTYL) 20 MG tablet Take 20 mg by mouth 2 (two) times daily.    Yes Historical Provider, MD  enoxaparin (LOVENOX) 80 MG/0.8ML injection Inject 80 mg into the skin every 12 (twelve) hours.   Yes Historical Provider, MD  fexofenadine (ALLEGRA) 180 MG tablet Take 180 mg by mouth daily.     Yes Historical Provider, MD  gabapentin (NEURONTIN) 300 MG capsule Take 300 mg by mouth 3 (three) times daily.   Yes Historical Provider, MD  guaiFENesin (MUCINEX)  600 MG 12 hr tablet Take by mouth 2 (two) times daily.   Yes Historical Provider, MD  indapamide (LOZOL) 2.5 MG tablet Take 2.5 mg by mouth every morning. Take two tablets once daily    Yes Historical Provider, MD  isosorbide mononitrate (IMDUR) 30 MG 24 hr tablet Take 30 mg by mouth daily.     Yes Historical Provider, MD  levofloxacin (LEVAQUIN) 500 MG tablet Take 500 mg by mouth daily.   Yes Historical Provider, MD  meclizine (ANTIVERT) 25 MG tablet Take 25 mg by mouth daily.    Yes Historical Provider, MD  metoprolol succinate (TOPROL-XL) 25 MG 24 hr tablet Take 12.5 mg by mouth 2 (two) times daily.    Yes Historical Provider, MD  montelukast (SINGULAIR) 10 MG tablet Take 10 mg by mouth at bedtime.   Yes Historical Provider, MD  niacin (NIASPAN) 1000 MG CR tablet Take 1,000 mg by mouth at bedtime.   Yes Historical Provider, MD  omeprazole (PRILOSEC) 20 MG capsule Take 20 mg by mouth daily. Gets meds otc   Yes Historical Provider, MD  polyethylene glycol (MIRALAX / GLYCOLAX) packet Take 17 g by mouth daily. using prn   Yes Historical Provider, MD  potassium chloride SA (K-DUR,KLOR-CON) 20 MEQ tablet Take 20 mEq by mouth. Take one and one half tablet twice daily  Yes Historical Provider, MD  Probiotic Product (PROBIOTIC PO) Take 1 capsule by mouth daily.     Yes Historical Provider, MD  sertraline (ZOLOFT) 100 MG tablet Take 100 mg by mouth daily.   Yes Historical Provider, MD  simvastatin (ZOCOR) 40 MG tablet Take 40 mg by mouth at bedtime.     Yes Historical Provider, MD  tolterodine (DETROL LA) 4 MG 24 hr capsule Take 4 mg by mouth daily.    Yes Historical Provider, MD  warfarin (COUMADIN) 5 MG tablet Take 2.5-5 mg by mouth daily. 1/2 tablet (2.5) Monday through fri. Take whole tablet on Saturday and Sunday.   Yes Historical Provider, MD  hyoscyamine (LEVSIN SL) 0.125 MG SL tablet Place 0.125 mg under the tongue every 4 (four) hours as needed for cramping.    Historical Provider, MD   nitroGLYCERIN (NITROSTAT) 0.4 MG SL tablet Place 0.4 mg under the tongue every 5 (five) minutes as needed.      Historical Provider, MD    Allergies as of 02/21/2013 - Review Complete 02/21/2013  Allergen Reaction Noted  . Clarithromycin Other (See Comments)   . Lisinopril Swelling     Family History  Problem Relation Age of Onset  . Colon polyps Neg Hx   . Colon cancer Neg Hx     History   Social History  . Marital Status: Married    Spouse Name: N/A    Number of Children: N/A  . Years of Education: N/A   Occupational History  . Not on file.   Social History Main Topics  . Smoking status: Never Smoker   . Smokeless tobacco: Not on file  . Alcohol Use: No  . Drug Use: No  . Sexual Activity: Not on file   Other Topics Concern  . Not on file   Social History Narrative  . No narrative on file    Review of Systems: See HPI, otherwise negative ROS   Physical Exam: BP 133/79  Temp(Src) 97.7 F (36.5 C) (Oral)  Resp 16  Ht 5\' 5"  (1.651 m)  Wt 219 lb (99.338 kg)  BMI 36.44 kg/m2  SpO2 97% General:   Alert,  pleasant and cooperative in NAD Head:  Normocephalic and atraumatic. Neck:  Supple; Lungs:  Clear throughout to auscultation.    Heart:  Regular rate and rhythm. Abdomen:  Soft, nontender and nondistended. Normal bowel sounds, without guarding, and without rebound.   Neurologic:  Alert and  oriented x4;  grossly normal neurologically.  Impression/Plan:     DYSPHAGIA  PLAN:  EGD/DIL TODAY

## 2013-04-02 ENCOUNTER — Telehealth: Payer: Self-pay

## 2013-04-02 NOTE — Telephone Encounter (Signed)
Pt will come by the office for SLF's nurse to look at the bruising.

## 2013-04-02 NOTE — Telephone Encounter (Signed)
Pt called this morning because she is having some bruising from the Lovenox shots. She want [facility]F to look at it and see if she is ok to continue the shots or does she need to stop them. I told her she would have some bruising from the shots. Please advise

## 2013-04-02 NOTE — Telephone Encounter (Signed)
PLEASE CALL PT.  Let her know I am not in clinic today but if she would like to stop by the office and have Doris look at it she may. SHE NEEDS TO CONTINUE THE LOVENOX SHOTS UNTIL NOV 14.

## 2013-04-02 NOTE — Telephone Encounter (Signed)
DISCUSSED FINDINGS WITH LL. PLEASE CALL PT. SHE MOST LIKELY HAS A HEMATOMA FROM AN INJECTION. SHE SHOULD HOLD LOVENOX TODAY. START LOVENOX 80 MG SQ ONCE DAILY AND STOP SHOTS ON NOV 14.

## 2013-04-02 NOTE — Telephone Encounter (Signed)
I called and informed pt. She will hold Lovenox today, and take 80 mg tomorrow and will put it in left side. She will take again on 04/04/2013, 24 hours after dose tomorrow. She will D/C on 04/05/2013.

## 2013-04-02 NOTE — Telephone Encounter (Signed)
Pt said her Lovenox had been 100 mg. So I called pharmacy and spoke to [place]. He said her new prescription was for 80 mg. I called back to make sure the pt had the new one for the 80 mg per injection and she said she does.

## 2013-04-02 NOTE — Telephone Encounter (Signed)
Pt came by the office. I looked at her and had Tana Coast, PA take a look as well. She is bruised on the right side from the rib down to hip. The whole right side is black/blue. In the center of that area there is a lemon size induration.  Pt went by the hospital on Sat with some of a problem, and they checked CBC and platelets were normal. She has not taken the Lovenox since Sun AM because of the extent of this bruising. Verlon Au said to send info to Dr. Darrick Penna and let her make a decision on what to do. Pt said she has a doctor's appt this afternoon at 3:00 in [place] for her leg. We can call her and leave message on her mobile at [phone]

## 2013-04-02 NOTE — Telephone Encounter (Signed)
REVIEWED.  

## 2013-04-03 ENCOUNTER — Encounter (HOSPITAL_COMMUNITY): Payer: Self-pay | Admitting: Gastroenterology

## 2013-04-10 ENCOUNTER — Telehealth: Payer: Self-pay | Admitting: Gastroenterology

## 2013-04-10 NOTE — Telephone Encounter (Signed)
Please call pt. HER stomach Bx shows gastritis & benign stomach polyp.    FOLLOW A HIGH FIBER/LOW FAT DIET.  Use Prilosec 30 minutes prior to your first meal. CONTINUE YOUR WEIGHT LOSS EFFORTS. FOLLOW UP MAR 2015 e30 dysphagia, IBS.

## 2013-04-11 NOTE — Telephone Encounter (Signed)
Pt returned call and was informed.  

## 2013-04-11 NOTE — Telephone Encounter (Signed)
LMOM to call.

## 2013-04-11 NOTE — Telephone Encounter (Signed)
Reminder in epic °

## 2013-04-11 NOTE — Telephone Encounter (Signed)
Results Cc to PCP  

## 2013-08-14 ENCOUNTER — Telehealth: Payer: Self-pay | Admitting: Gastroenterology

## 2013-08-14 ENCOUNTER — Encounter: Payer: BC Managed Care – PPO | Admitting: Gastroenterology

## 2013-08-14 NOTE — Telephone Encounter (Signed)
Pt was a no show

## 2013-08-15 NOTE — Progress Notes (Signed)
error 

## 2013-09-18 ENCOUNTER — Encounter: Payer: Self-pay | Admitting: Gastroenterology

## 2013-09-18 ENCOUNTER — Encounter (INDEPENDENT_AMBULATORY_CARE_PROVIDER_SITE_OTHER): Payer: Self-pay

## 2013-09-18 ENCOUNTER — Ambulatory Visit (INDEPENDENT_AMBULATORY_CARE_PROVIDER_SITE_OTHER): Payer: BC Managed Care – PPO | Admitting: Gastroenterology

## 2013-09-18 VITALS — BP 166/90 | HR 86 | Temp 97.6°F | Ht 65.0 in | Wt 222.6 lb

## 2013-09-18 DIAGNOSIS — R109 Unspecified abdominal pain: Secondary | ICD-10-CM

## 2013-09-18 DIAGNOSIS — K589 Irritable bowel syndrome without diarrhea: Secondary | ICD-10-CM

## 2013-09-18 NOTE — Assessment & Plan Note (Signed)
IN ACUTE FLARE.  FULL LIQUID DIET FOR  DAYS CUT BACK ON BENTYL TO 10 MG TWICE DAILY OPV IN 4 MOS

## 2013-09-18 NOTE — Patient Instructions (Signed)
FOLLOW A FULL LIQUID DIET FOR  DAYS.  CUT BACK ON BENTYL TO 10 MG TWICE DAILY  COMPLETE LABS AND ULTRASOUND.  FOLLOW UP IN 4 MOS.    Full Liquid Diet A high-calorie, high-protein supplement should be used to meet your nutritional requirements when the full liquid diet is continued for more than 2 or 3 days. If this diet is to be used for an extended period of time (more than 7 days), a multivitamin should be considered.  Breads and Starches  Allowed: None are allowed except crackersWHOLE OR pureed (made into a thick, smooth soup) in soup. Cooked, refined corn, oat, rice, rye, and wheat cereals are also allowed.   Avoid: Any others.    Potatoes/Pasta/Rice  Allowed: ANY ITEM AS A SOUP OR SMALL PLATE OF MASHED POTATOES OR RICE.       Vegetables  Allowed: Strained tomato or vegetable juice. Vegetables pureed in soup.   Avoid: Any others.    Fruit  Allowed: Any strained fruit juices and fruit drinks. Include 1 serving of citrus or vitamin C-enriched fruit juice daily.   Avoid: Any others.  Meat and Meat Substitutes  Allowed: Egg  Avoid: Any meat, fish, or fowl. All cheese.  Milk  Allowed: SOY Milk beverages, including milk shakes and instant breakfast mixes. Smooth yogurt.   Avoid: Any others. Avoid dairy products if not tolerated.    Soups and Combination Foods  Allowed: Broth, strained cream soups. Strained, broth-based soups.   Avoid: Any others.    Desserts and Sweets  Allowed: flavored gelatin, tapioca, plain LACTOSE FREE ice cream, sherbet, smooth pudding, junket, fruit ices, frozen ice pops, pudding pops,, frozen fudge pops, chocolate syrup. Sugar, honey, jelly, syrup.   Avoid: Any others.  Fats and Oils  Allowed: Margarine, butter, cream, sour cream, oils.   Avoid: Any others.  Beverages  Allowed: All.   Avoid: None.  Condiments  Allowed: Iodized salt, pepper, spices, flavorings. Cocoa powder.   Avoid: Any others.    SAMPLE MEAL  PLAN Breakfast   cup orange juice.   1 cup cooked wheat cereal.   1 cup SOY milk.   1 cup beverage (coffee or tea).   Cream or sugar, if desired.    Midmorning Snack  2 SCRAMBLED OR HARD BOILED EGG   Lunch  1 cup cream soup.    cup fruit juice.   1 cup SOY milk.    cup custard.   1 cup beverage (coffee or tea).   Cream or sugar, if desired.    Midafternoon Snack  1 cup VANILLA SOY milk shake.  Dinner  1 cup cream soup.    cup fruit juice.   1 cup SOY milk.    cup pudding.   1 cup beverage (coffee or tea).   Cream or sugar, if desired.  Evening Snack  1 cup supplement.  To increase calories, add sugar, cream, butter, or margarine if possible. Nutritional supplements will also increase the total calories.

## 2013-09-18 NOTE — Progress Notes (Signed)
cc'd to pcp 

## 2013-09-18 NOTE — Assessment & Plan Note (Addendum)
ASSOCIATED WITH PAIN UNDER RIGHT SCAPULA & ANOREXIA-ETIOLOGY UNCLEAR. DIFFERENTIAL DIAGNOSIS INCLUDES FUNCTIONAL ABDOMINAL PAIN, OCCULT CBD STONE, LES LIKELY BILIARY STRICTURE, OR CHOLANGIOCA/MASS INHOP.  CMP/LIPASE RUQ U/S OPV IN 4 MOS
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
## 2013-09-20 NOTE — Progress Notes (Signed)
Reminder in epic °

## 2013-09-24 ENCOUNTER — Ambulatory Visit (HOSPITAL_COMMUNITY)
Admission: RE | Admit: 2013-09-24 | Discharge: 2013-09-24 | Disposition: A | Discharge status: Home / Self Care | Payer: BC Managed Care – PPO | Source: Ambulatory Visit | Attending: Gastroenterology | Admitting: Gastroenterology

## 2013-09-24 DIAGNOSIS — K7689 Other specified diseases of liver: Secondary | ICD-10-CM | POA: Insufficient documentation

## 2013-09-24 DIAGNOSIS — R109 Unspecified abdominal pain: Secondary | ICD-10-CM | POA: Insufficient documentation

## 2013-09-24 LAB — COMPLETE METABOLIC PANEL WITH GFR
ALT: 18 U/L (ref 0–35)
AST: 24 U/L (ref 0–37)
Albumin: 3.9 g/dL (ref 3.5–5.2)
Alkaline Phosphatase: 60 U/L (ref 39–117)
BUN: 8 mg/dL (ref 6–23)
CO2: 31 mEq/L (ref 19–32)
Calcium: 9.3 mg/dL (ref 8.4–10.5)
Chloride: 99 mEq/L (ref 96–112)
Creat: 0.74 mg/dL (ref 0.50–1.10)
GFR, Est African American: >89 mL/min
GFR, Est Non African American: 86 mL/min
Glucose, Bld: 88 mg/dL (ref 70–99)
Potassium: 3 mEq/L — ABNORMAL LOW (ref 3.5–5.3)
Sodium: 139 mEq/L (ref 135–145)
Total Bilirubin: 0.6 mg/dL (ref 0.2–1.2)
Total Protein: 7.3 g/dL (ref 6.0–8.3)

## 2013-09-24 LAB — LIPASE: Lipase: 11 U/L (ref 0–75)

## 2013-09-29 NOTE — Progress Notes (Addendum)
PLEASE CALL PT. HER BLOOD TESTS ARE NORMAL EXCEPT SHE HAS A LOW POTASSIUM. SHE SHOULD TAKE KCL 20 mEq TABS 2 PO BID FOR 3 DAYS. SHE SHOULD FOLLOW UP WITH HER PCP FOR REPEAT BMP THUR, HER U/S SHOW A FATTY LIVER. THIS OCCURS WHEN YOU CARRY TOO MUCH WEIGHT. HER BMI IS 37 WHICH MEANS SHE IS MODERATELY OBESE. CONTINUE YOUR WEIGHT LOSS EFFORTS. STRICTLY ADHERE TO A FOLLOW A HIGH FIBER/LOW FAT DIET.

## 2013-09-30 NOTE — Progress Notes (Signed)
Called and informed pt. She has the potassium 20 meq tablets.

## 2013-10-12 ENCOUNTER — Emergency Department (HOSPITAL_COMMUNITY)
Admission: EM | Admit: 2013-10-12 | Discharge: 2013-10-12 | Disposition: A | Discharge status: Home / Self Care | Payer: BC Managed Care – PPO | Attending: Emergency Medicine | Admitting: Emergency Medicine

## 2013-10-12 ENCOUNTER — Encounter (HOSPITAL_COMMUNITY): Payer: Self-pay | Admitting: Emergency Medicine

## 2013-10-12 ENCOUNTER — Emergency Department (HOSPITAL_COMMUNITY): Payer: BC Managed Care – PPO

## 2013-10-12 DIAGNOSIS — F411 Generalized anxiety disorder: Secondary | ICD-10-CM | POA: Insufficient documentation

## 2013-10-12 DIAGNOSIS — Z87448 Personal history of other diseases of urinary system: Secondary | ICD-10-CM | POA: Insufficient documentation

## 2013-10-12 DIAGNOSIS — Z7901 Long term (current) use of anticoagulants: Secondary | ICD-10-CM | POA: Insufficient documentation

## 2013-10-12 DIAGNOSIS — R300 Dysuria: Secondary | ICD-10-CM | POA: Insufficient documentation

## 2013-10-12 DIAGNOSIS — Z79899 Other long term (current) drug therapy: Secondary | ICD-10-CM | POA: Insufficient documentation

## 2013-10-12 DIAGNOSIS — Z9889 Other specified postprocedural states: Secondary | ICD-10-CM | POA: Insufficient documentation

## 2013-10-12 DIAGNOSIS — I2510 Atherosclerotic heart disease of native coronary artery without angina pectoris: Secondary | ICD-10-CM | POA: Insufficient documentation

## 2013-10-12 DIAGNOSIS — Z90710 Acquired absence of both cervix and uterus: Secondary | ICD-10-CM | POA: Insufficient documentation

## 2013-10-12 DIAGNOSIS — Z3202 Encounter for pregnancy test, result negative: Secondary | ICD-10-CM | POA: Insufficient documentation

## 2013-10-12 DIAGNOSIS — I252 Old myocardial infarction: Secondary | ICD-10-CM | POA: Insufficient documentation

## 2013-10-12 DIAGNOSIS — I10 Essential (primary) hypertension: Secondary | ICD-10-CM | POA: Insufficient documentation

## 2013-10-12 DIAGNOSIS — R110 Nausea: Secondary | ICD-10-CM | POA: Insufficient documentation

## 2013-10-12 DIAGNOSIS — Z8719 Personal history of other diseases of the digestive system: Secondary | ICD-10-CM | POA: Insufficient documentation

## 2013-10-12 DIAGNOSIS — R109 Unspecified abdominal pain: Secondary | ICD-10-CM | POA: Insufficient documentation

## 2013-10-12 DIAGNOSIS — E785 Hyperlipidemia, unspecified: Secondary | ICD-10-CM | POA: Insufficient documentation

## 2013-10-12 DIAGNOSIS — E669 Obesity, unspecified: Secondary | ICD-10-CM | POA: Insufficient documentation

## 2013-10-12 DIAGNOSIS — Z86711 Personal history of pulmonary embolism: Secondary | ICD-10-CM | POA: Insufficient documentation

## 2013-10-12 DIAGNOSIS — Z792 Long term (current) use of antibiotics: Secondary | ICD-10-CM | POA: Insufficient documentation

## 2013-10-12 LAB — URINALYSIS, ROUTINE W REFLEX MICROSCOPIC
Bilirubin Urine: NEGATIVE
Glucose, UA: NEGATIVE mg/dL
Hgb urine dipstick: NEGATIVE
Ketones, ur: NEGATIVE mg/dL
Leukocytes, UA: NEGATIVE
Nitrite: NEGATIVE
Protein, ur: NEGATIVE mg/dL
Specific Gravity, Urine: 1.015 (ref 1.005–1.030)
Urobilinogen, UA: 0.2 mg/dL (ref 0.0–1.0)
pH: 8.5 — ABNORMAL HIGH (ref 5.0–8.0)

## 2013-10-12 LAB — BASIC METABOLIC PANEL
BUN: 13 mg/dL (ref 6–23)
CO2: 29 mEq/L (ref 19–32)
Calcium: 9.2 mg/dL (ref 8.4–10.5)
Chloride: 100 mEq/L (ref 96–112)
Creatinine, Ser: 0.75 mg/dL (ref 0.50–1.10)
GFR calc Af Amer: >90 mL/min (ref >90)
GFR calc non Af Amer: 88 mL/min — ABNORMAL LOW (ref >90)
Glucose, Bld: 107 mg/dL — ABNORMAL HIGH (ref 70–99)
Potassium: 3.2 mEq/L — ABNORMAL LOW (ref 3.7–5.3)
Sodium: 140 mEq/L (ref 137–147)

## 2013-10-12 LAB — PREGNANCY, URINE: Preg Test, Ur: NEGATIVE

## 2013-10-12 MED ORDER — POTASSIUM CHLORIDE CRYS ER 20 MEQ PO TBCR
40.0000 meq | EXTENDED_RELEASE_TABLET | Freq: Once | ORAL | Status: AC
  Administered 2013-10-12: 40 meq via ORAL
  Filled 2013-10-12: qty 2

## 2013-10-12 MED ORDER — SIMETHICONE 80 MG PO CHEW
80.0000 mg | CHEWABLE_TABLET | Freq: Once | ORAL | Status: AC
  Administered 2013-10-12: 80 mg via ORAL
  Filled 2013-10-12: qty 1

## 2013-10-12 MED ORDER — SIMETHICONE 80 MG PO CHEW: 80.0000 mg | CHEWABLE_TABLET | Freq: Four times a day (QID) | ORAL | Status: DC | PRN

## 2013-10-12 NOTE — Discharge Instructions (Signed)
Abdominal Pain, Adult Ask your physician to check your INR (Coumadin level) in one week and you have prescribed a new medicine today. You should also get your blood potassium rechecked in a week by your physician. Today's was mildly low at 3.2. Take the medicine prescribed as needed for gas and discomfort. You can also take Tylenol as directed for discomfort Many things can cause belly (abdominal) pain. Most times, the belly pain is not dangerous. Many cases of belly pain can be watched and treated at home. HOME CARE   Do not take medicines that help you go poop (laxatives) unless told to by your doctor.  Only take medicine as told by your doctor.  Eat or drink as told by your doctor. Your doctor will tell you if you should be on a special diet. GET HELP IF:  You do not know what is causing your belly pain.  You have belly pain while you are sick to your stomach (nauseous) or have runny poop (diarrhea).  You have pain while you pee or poop.  Your belly pain wakes you up at night.  You have belly pain that gets worse or better when you eat.  You have belly pain that gets worse when you eat fatty foods. GET HELP RIGHT AWAY IF:   The pain does not go away within 2 hours.  You have a fever.  You keep throwing up (vomiting).  The pain changes and is only in the right or left part of the belly.  You have bloody or tarry looking poop. MAKE SURE YOU:   Understand these instructions.  Will watch your condition.  Will get help right away if you are not doing well or get worse. Document Released: 10/26/2007 Document Revised: 02/27/2013 Document Reviewed: 01/16/2013 [facility] Patient Information 2014 [place].

## 2013-10-12 NOTE — ED Notes (Signed)
Pt c/o lower abd pain/cramping and pressure with urination and "pressure on bowels".  Some nausea and flutulence. Denies v/d.

## 2013-10-12 NOTE — ED Provider Notes (Signed)
CSN: [id number]     Arrival date & time 10/12/13  7829 History  This chart was scribed for Orlie Dakin, MD by Delphia Grates, ED Scribe. This patient was seen in room APA17/[facility]17 and the patient's care was started at 9:27 AM.    Chief Complaint  Patient presents with  . Abdominal Pain     Patient is a 63 y.o. female presenting with abdominal pain. The history is provided by the patient. No language interpreter was used.  Abdominal Pain Pain location: Lower. Pain quality: pressure   Pain radiates to:  R leg and L leg Pain severity:  Moderate Progression:  Unchanged Chronicity:  Recurrent Relieved by:  Flatus Ineffective treatments:  OTC medications Associated symptoms: dysuria, flatus and nausea   Associated symptoms: no vomiting   Risk factors: no alcohol abuse     HPI Comments: Courtney Rogers is a 63 y.o. female with history of overactive bladder, IBS, and dyspepsia who presents to the Emergency Department complaining of recurrent, lower abdominal pain that began yesterday. Patient states there is pressure with urination and "pressure on her bowels". She states she feel as if she "has to go"(urinate). Patient reports history of gastritis and states this feels similar. Patient rates the pain as 9/10 and states it radiates to down to her bilateral knees.There is associated flatulence, as well as nausea yesterday, but this has resolved. Patient reports releasing flatus provides her with temporary relief. She states she has taken Mylanta with no relief. Patient states she has not eaten, and is not hungry. Last bowel movement yesterday, mixed with lots of flatus. She denies fever and emesis. Patient has history of MI (2009) and PE (1 year ago). Patient is taking Warfarin. Patient does not smoke, drink, or use illegal substances. She reports history of hysterectomy and knee surgery.  PCP: Claggett, PA ([facility])   Past Medical History  Diagnosis Date  . AMI  (acute mesenteric ischemia) 2009    CAD/ no stent medically managed  . CAD (coronary artery disease)     medically managed  . Anxiety disorder   . Hyperlipidemia   . Hypertension   . Overactive bladder   . IBS (irritable bowel syndrome)   . Abdominal pain   . Dyspepsia   . Nephropathy     screening  . PE (pulmonary embolism) JUN 2014   Past Surgical History  Procedure Laterality Date  . Bso secondary to cyst    . Abdominal hysterectomy    . Esophagogastroduodenoscopy  05/11/09    schatzki ring/small hiatal hernia/path:gastritis  . Colonoscopy  12/2009    Dr. West Carbo, propofol, normal. Next TCS 12/2019  . Esophagogastroduodenoscopy (egd) with esophageal dilation N/A 04/01/2013    Procedure: ESOPHAGOGASTRODUODENOSCOPY (EGD) WITH ESOPHAGEAL DILATION;  Surgeon: Danie Binder, MD;  Location: [facility];  Service: Endoscopy;  Laterality: N/A;  8:30   Family History  Problem Relation Age of Onset  . Colon polyps Neg Hx   . Colon cancer Neg Hx    History  Substance Use Topics  . Smoking status: Never Smoker   . Smokeless tobacco: Not on file  . Alcohol Use: No   OB History   Grav Para Term Preterm Abortions TAB SAB Ect Mult Living                 Review of Systems  Constitutional: Negative.   HENT: Negative.   Respiratory: Negative.   Cardiovascular: Negative.   Gastrointestinal: Positive for nausea, abdominal pain  and flatus. Negative for vomiting.  Genitourinary: Positive for dysuria.  Musculoskeletal: Negative.   Skin: Negative.   Neurological: Negative.   Psychiatric/Behavioral: Negative.   All other systems reviewed and are negative.     Allergies  Biaxin and Lisinopril  Home Medications   Prior to Admission medications   Medication Sig Start Date End Date Taking? Authorizing Provider  albuterol (PROVENTIL HFA;VENTOLIN HFA) 108 (90 BASE) MCG/ACT inhaler Inhale 2 puffs into the lungs every 6 (six) hours as needed for shortness of breath.    Historical  Provider, MD  B Complex-C (B-COMPLEX WITH VITAMIN C) tablet Take 1 tablet by mouth daily.    Historical Provider, MD  calcium carbonate (OS-CAL) 600 MG TABS Take 600 mg by mouth 2 (two) times daily with a meal.     Historical Provider, MD  dicyclomine (BENTYL) 20 MG tablet Take 20 mg by mouth 2 (two) times daily.     Historical Provider, MD  fexofenadine (ALLEGRA) 180 MG tablet Take 180 mg by mouth daily.      Historical Provider, MD  gabapentin (NEURONTIN) 300 MG capsule Take 300 mg by mouth 3 (three) times daily.    Historical Provider, MD  guaiFENesin (MUCINEX) 600 MG 12 hr tablet Take by mouth 2 (two) times daily.    Historical Provider, MD  isosorbide mononitrate (IMDUR) 30 MG 24 hr tablet Take 30 mg by mouth daily.      Historical Provider, MD  levofloxacin (LEVAQUIN) 500 MG tablet Take 500 mg by mouth daily.    Historical Provider, MD  meclizine (ANTIVERT) 25 MG tablet Take 25 mg by mouth daily.     Historical Provider, MD  metoprolol succinate (TOPROL-XL) 25 MG 24 hr tablet Take 12.5 mg by mouth 2 (two) times daily.     Historical Provider, MD  montelukast (SINGULAIR) 10 MG tablet Take 10 mg by mouth at bedtime.    Historical Provider, MD  nitroGLYCERIN (NITROSTAT) 0.4 MG SL tablet Place 0.4 mg under the tongue every 5 (five) minutes as needed.      Historical Provider, MD  omeprazole (PRILOSEC) 20 MG capsule Take 20 mg by mouth daily. Gets meds otc    Historical Provider, MD  polyethylene glycol (MIRALAX / GLYCOLAX) packet Take 17 g by mouth daily. using prn    Historical Provider, MD  potassium chloride SA (K-DUR,KLOR-CON) 20 MEQ tablet Take 20 mEq by mouth. Take one and one half tablet twice daily     Historical Provider, MD  Probiotic Product (PROBIOTIC PO) Take 1 capsule by mouth daily.      Historical Provider, MD  sertraline (ZOLOFT) 100 MG tablet Take 100 mg by mouth daily.    Historical Provider, MD  simvastatin (ZOCOR) 40 MG tablet Take 40 mg by mouth at bedtime.      Historical  Provider, MD  tolterodine (DETROL LA) 4 MG 24 hr capsule Take 4 mg by mouth daily.     Historical Provider, MD  warfarin (COUMADIN) 5 MG tablet Take 5 mg by mouth daily. 2.5 MG/5 MG QOD AND ON SAT/SUN 5 MG    Historical Provider, MD   Triage Vitals: BP 168/105  Pulse 67  Temp(Src) 97.4 F (36.3 C) (Oral)  Resp 18  Ht 5\' 5"  (1.651 m)  Wt 221 lb (100.245 kg)  BMI 36.78 kg/m2  SpO2 97%  Physical Exam  Nursing note and vitals reviewed. Constitutional: She appears well-developed and well-nourished.  HENT:  Head: Normocephalic and atraumatic.  Eyes: Conjunctivae are normal.  Pupils are equal, round, and reactive to light.  Neck: Neck supple. No tracheal deviation present. No thyromegaly present.  Cardiovascular: Normal rate and regular rhythm.   No murmur heard. Pulmonary/Chest: Effort normal and breath sounds normal.  Abdominal: Soft. Bowel sounds are normal. She exhibits no distension. There is no tenderness.  Obese  Musculoskeletal: Normal range of motion. She exhibits no edema and no tenderness.  Neurological: She is alert. Coordination normal.  Skin: Skin is warm and dry. No rash noted.  Psychiatric: She has a normal mood and affect.    ED Course  Procedures (including critical care time)   Labs Review Labs Reviewed  URINALYSIS, ROUTINE W REFLEX MICROSCOPIC - Abnormal; Notable for the following:    APPearance HAZY (*)    pH 8.5 (*)    All other components within normal limits  BASIC METABOLIC PANEL - Abnormal; Notable for the following:    Potassium 3.2 (*)    Glucose, Bld 107 (*)    GFR calc non Af Amer 88 (*)    All other components within normal limits  PREGNANCY, URINE    Imaging Review Dg Abd Acute W/chest  10/12/2013   CLINICAL DATA:  Abdominal pain, nausea  EXAM: ACUTE ABDOMEN SERIES (ABDOMEN 2 VIEW & CHEST 1 VIEW)  COMPARISON:  CT 05/05/2009  FINDINGS: Heart size upper limits normal. Linear subsegmental atelectasis or early infiltrates in the lung bases, left  greater than right.  No definite effusion.  No free air. A few gas filled nondilated small bowel loops. Normal distribution of gas and stool throughout the colon. Degenerative changes in the lumbar spine. Bilateral pelvic phleboliths.  Osteitis pubis.  IMPRESSION: 1. Mild cardiomegaly. 2. Bibasilar atelectasis or early interstitial infiltrates, left greater than right. 3. Nonobstructive bowel gas pattern.  No free air.   Electronically Signed   By: Arne Cleveland M.D.   On: 10/12/2013 10:11     EKG Interpretation None     1030 a.m. feels improved after treatment with simethicone Bladder scan showed 42 mL of urine post port residual X-rays rviewed by me. Results for orders placed during the hospital encounter of 10/12/13  URINALYSIS, ROUTINE W REFLEX MICROSCOPIC      Result Value Ref Range   Color, Urine YELLOW  YELLOW   APPearance HAZY (*) CLEAR   Specific Gravity, Urine 1.015  1.005 - 1.030   pH 8.5 (*) 5.0 - 8.0   Glucose, UA NEGATIVE  NEGATIVE mg/dL   Hgb urine dipstick NEGATIVE  NEGATIVE   Bilirubin Urine NEGATIVE  NEGATIVE   Ketones, ur NEGATIVE  NEGATIVE mg/dL   Protein, ur NEGATIVE  NEGATIVE mg/dL   Urobilinogen, UA 0.2  0.0 - 1.0 mg/dL   Nitrite NEGATIVE  NEGATIVE   Leukocytes, UA NEGATIVE  NEGATIVE  PREGNANCY, URINE      Result Value Ref Range   Preg Test, Ur NEGATIVE  NEGATIVE  BASIC METABOLIC PANEL      Result Value Ref Range   Sodium 140  137 - 147 mEq/L   Potassium 3.2 (*) 3.7 - 5.3 mEq/L   Chloride 100  96 - 112 mEq/L   CO2 29  19 - 32 mEq/L   Glucose, Bld 107 (*) 70 - 99 mg/dL   BUN 13  6 - 23 mg/dL   Creatinine, Ser 0.75  0.50 - 1.10 mg/dL   Calcium 9.2  8.4 - 10.5 mg/dL   GFR calc non Af Amer 88 (*) >90 mL/min   GFR calc Af Amer >  90  >90 mL/min   Dg Abd Acute W/chest  10/12/2013   CLINICAL DATA:  Abdominal pain, nausea  EXAM: ACUTE ABDOMEN SERIES (ABDOMEN 2 VIEW & CHEST 1 VIEW)  COMPARISON:  CT 05/05/2009  FINDINGS: Heart size upper limits normal. Linear  subsegmental atelectasis or early infiltrates in the lung bases, left greater than right.  No definite effusion.  No free air. A few gas filled nondilated small bowel loops. Normal distribution of gas and stool throughout the colon. Degenerative changes in the lumbar spine. Bilateral pelvic phleboliths.  Osteitis pubis.  IMPRESSION: 1. Mild cardiomegaly. 2. Bibasilar atelectasis or early interstitial infiltrates, left greater than right. 3. Nonobstructive bowel gas pattern.  No free air.   Electronically Signed   By: Arne Cleveland M.D.   On: 10/12/2013 10:11   US Abdomen Limited Ruq  09/24/2013   CLINICAL DATA:  Abdominal pain.  EXAM: US ABDOMEN LIMITED - RIGHT UPPER QUADRANT  COMPARISON:  None.  FINDINGS: Gallbladder:  No gallstones or wall thickening visualized. No sonographic Murphy sign noted.  Common bile duct:  Diameter: Maximal diameter of 6 mm.  Liver:  The liver demonstrates coarse echotexture and increased echogenicity, likely reflecting diffuse steatosis. No overt cirrhotic contour abnormalities or focal lesions are identified. There is no evidence of intrahepatic biliary ductal dilatation. The portal vein is open.  IMPRESSION: Evidence of hepatic steatosis.  Normal gallbladder and bile ducts.   Electronically Signed   By: Aletta Edouard M.D.   On: 09/24/2013 12:48    MDM   Final diagnoses:  None   I spoke with hospital pharmacist. Simethicone interacts minimally with warfarin, and should not cause significant bump in INR Plan prescription simethicone. Followup PMD if she continues to have significant discomfort next week. INR recheck 1 week Diagnosis #1 nonspecific abdominal pain 2 hypokalemia   I personally performed the services described in this documentation, which was scribed in my presence. The recorded information has been reviewed and considered.  Orlie Dakin, MD 10/12/13 1049

## 2013-10-18 ENCOUNTER — Telehealth: Payer: Self-pay

## 2013-10-18 ENCOUNTER — Other Ambulatory Visit: Payer: Self-pay

## 2013-10-18 DIAGNOSIS — R5381 Other malaise: Secondary | ICD-10-CM

## 2013-10-18 DIAGNOSIS — R5383 Other fatigue: Secondary | ICD-10-CM

## 2013-10-18 NOTE — Telephone Encounter (Signed)
Pt is aware. She requested the lab order for CBC be faxed to [place]!

## 2013-10-18 NOTE — Telephone Encounter (Signed)
Recommend she follow up with PCP regarding potassium concerns.   We could check her hemoglobin (ie CBC) for fatigue.  If her symptoms worsen, I would go to ER for urgent evaluation. Otherwise, discuss with [facility] next week.

## 2013-10-18 NOTE — Telephone Encounter (Signed)
Pt called and requested results of labs. Dr. Oneida Alar had given her some potassium and she was to have a BMP for recheck through her PCP.  She had it done on 10/03/2013. I told her that per Dr. Nona Dell note, her PCP was to follow on that. She said she received one call from a nurse at PCP and said her blood work was normal. Then she received a call and said she should check with Dr. Oneida Alar. I found the lab report in Dr. Nona Dell folder. Pt's potassium was 3.6 on 10/03/2013.  Pt was seen in [facility] ED on 10/12/2013 for some gastritis. Her potassium was 3.2 then. They gave her one potassium 40 meq at the hospital.   She is complaining today of being very weak and just cannot make herself go.   Please advise!

## 2013-10-22 NOTE — Telephone Encounter (Signed)
REVIEWED. AGREE. 

## 2014-01-07 ENCOUNTER — Ambulatory Visit (INDEPENDENT_AMBULATORY_CARE_PROVIDER_SITE_OTHER): Payer: BC Managed Care – PPO | Admitting: Gastroenterology

## 2014-01-07 ENCOUNTER — Encounter (INDEPENDENT_AMBULATORY_CARE_PROVIDER_SITE_OTHER): Payer: Self-pay

## 2014-01-07 ENCOUNTER — Encounter: Payer: Self-pay | Admitting: Gastroenterology

## 2014-01-07 VITALS — BP 146/67 | HR 50 | Temp 97.6°F | Ht 65.0 in | Wt 212.8 lb

## 2014-01-07 DIAGNOSIS — K589 Irritable bowel syndrome without diarrhea: Secondary | ICD-10-CM

## 2014-01-07 DIAGNOSIS — K219 Gastro-esophageal reflux disease without esophagitis: Secondary | ICD-10-CM

## 2014-01-07 NOTE — Patient Instructions (Signed)
1. You may increase your Bentyl to 3 or 4 per day during flares (increased cramps and increased stools). Hold for constipation. 2. Please review FODMAP diet and eliminate foods that you eat significant amounts of (one at a time) to see if your symptoms improve. 3. Call if you continue to have problems.

## 2014-01-07 NOTE — Progress Notes (Signed)
Primary Care Physician: Geroge Baseman  Primary Gastroenterologist:  Barney Drain, MD   Chief Complaint  Patient presents with  . Abdominal Pain    HPI: Courtney Rogers is a 63 y.o. female here for further evaluation of abdominal pain. She was last seen in April 2015 by Dr.Fields. History of IBS. She was having right upper quadrant pain as well. Abdominal ultrasound performed showing hepatic steatosis. Normal gallbladder no evidence of biliary dilation. Labs were unremarkable.  Went to [place] ER late 10/2013. BMs may be bristol 4. At least once per day. May be loose at times. Takes Miralax only as needed. Bentyl twice per day. Dry mouth. Feels gas under right shoulder blade. No melena, brbpr. No heartburn. No dysphagia. Flares may last for 1-2 weeks.   Current Outpatient Prescriptions  Medication Sig Dispense Refill  . albuterol (PROVENTIL HFA;VENTOLIN HFA) 108 (90 BASE) MCG/ACT inhaler Inhale 2 puffs into the lungs every 6 (six) hours as needed for shortness of breath.      Marland Kitchen amLODipine (NORVASC) 10 MG tablet Take 10 mg by mouth daily.      Marland Kitchen aspirin EC 81 MG tablet Take 81 mg by mouth at bedtime.      . B Complex-C (B-COMPLEX WITH VITAMIN C) tablet Take 1 tablet by mouth daily.      . Calcium Carbonate-Vitamin D (CALCIUM PLUS VITAMIN D PO) Take 1 tablet by mouth 2 (two) times daily.      . clonazePAM (KLONOPIN) 0.5 MG tablet Take 0.5 mg by mouth daily as needed for anxiety.      . dicyclomine (BENTYL) 20 MG tablet Take 10 mg by mouth 2 (two) times daily.       . fexofenadine (ALLEGRA) 180 MG tablet Take 180 mg by mouth daily.        Marland Kitchen gabapentin (NEURONTIN) 300 MG capsule Take 300 mg by mouth 3 (three) times daily.      . isosorbide mononitrate (IMDUR) 30 MG 24 hr tablet Take 30 mg by mouth daily.        Marland Kitchen losartan (COZAAR) 50 MG tablet Take 50 mg by mouth daily.      . meclizine (ANTIVERT) 25 MG tablet Take 12.5 mg by mouth 2 (two) times daily.       . metoprolol  tartrate (LOPRESSOR) 25 MG tablet Take 12 mg by mouth 2 (two) times daily.      . montelukast (SINGULAIR) 10 MG tablet Take 10 mg by mouth daily.       . nitroGLYCERIN (NITROSTAT) 0.4 MG SL tablet Place 0.4 mg under the tongue every 5 (five) minutes as needed.        Marland Kitchen omeprazole (PRILOSEC) 20 MG capsule Take 20 mg by mouth daily.       . polyethylene glycol (MIRALAX / GLYCOLAX) packet Take 17 g by mouth daily.       . potassium chloride SA (K-DUR,KLOR-CON) 20 MEQ tablet Take 40 mEq by mouth 2 (two) times daily.       . Probiotic Product (PROBIOTIC PO) Take 1 capsule by mouth daily.        . sertraline (ZOLOFT) 100 MG tablet Take 100 mg by mouth daily.      . simvastatin (ZOCOR) 40 MG tablet Take 40 mg by mouth at bedtime.        . tolterodine (DETROL LA) 4 MG 24 hr capsule Take 4 mg by mouth at bedtime.       Marland Kitchen  warfarin (COUMADIN) 5 MG tablet Take 2.5-5 mg by mouth See admin instructions. Monday Wednesday and Friday take 2.5 (1/2 tablet) takes 5 mg (1 tablet) on Tuesday Thursday Saturday and Sunday       No current facility-administered medications for this visit.    Allergies as of 01/07/2014 - Review Complete 01/07/2014  Allergen Reaction Noted  . Biaxin [clarithromycin] Other (See Comments)   . Lisinopril Swelling     ROS:  General: Negative for anorexia, weight loss, fever, chills, fatigue, weakness. ENT: Negative for hoarseness, difficulty swallowing , nasal congestion. CV: Negative for chest pain, angina, palpitations, dyspnea on exertion, peripheral edema.  Respiratory: Negative for dyspnea at rest, dyspnea on exertion, cough, sputum, wheezing.  GI: See history of present illness. GU:  Negative for dysuria, hematuria, urinary incontinence, urinary frequency, nocturnal urination.  Endo: Negative for unusual weight change.    Physical Examination:   BP 146/67  Pulse 50  Temp(Src) 97.6 F (36.4 C) (Oral)  Ht 5\' 5"  (1.651 m)  Wt 212 lb 12.8 oz (96.525 kg)  BMI 35.41  kg/m2  General: Well-nourished, well-developed in no acute distress.  Eyes: No icterus. Mouth: Oropharyngeal mucosa moist and pink , no lesions erythema or exudate. Lungs: Clear to auscultation bilaterally.  Heart: Regular rate and rhythm, no murmurs rubs or gallops.  Abdomen: Bowel sounds are normal, nontender, nondistended, no hepatosplenomegaly or masses, no abdominal bruits or hernia , no rebound or guarding.   Extremities: No lower extremity edema. No clubbing or deformities. Neuro: Alert and oriented x 4   Skin: Warm and dry, no jaundice.   Psych: Alert and cooperative, normal mood and affect.

## 2014-01-11 NOTE — Assessment & Plan Note (Signed)
Doing well at this time  

## 2014-01-11 NOTE — Assessment & Plan Note (Addendum)
Ongoing flares. Increase bentyl during flares. FODMAP diet. Call if symptoms don't improve.

## 2014-01-13 NOTE — Progress Notes (Signed)
cc'd to pcp 

## 2014-11-13 ENCOUNTER — Telehealth: Payer: Self-pay

## 2014-11-13 NOTE — Telephone Encounter (Signed)
Pt called office and states that she seen Dr. Hilario Quarry at [facility] [place] and she states that the Bentyl was not working and he wanted her to try [place].  Pt wants someone to call her and make sure it is ok to try this and to discuss side effects.   Please call at [phone]

## 2014-11-13 NOTE — Telephone Encounter (Signed)
PT called and said she had some reflux problems and some abdominal pain and she had went to the urgent care doctor ( she said for something else, which she did not say what). The doctor started her on Linzess daily and she wanted to know what we thought of it. However, she said the Bentyl had not been helping her cramps. She did say she has 3 good BM's daily. She takes Miralax prn. I just told her not to take the Linzess until we talked to [place] since she is having 3 good BM's daily.  She is scheduled for an OV with Neil Crouch, PA on 12/09/2014 ( it has been almost a year since we seen her).

## 2014-11-13 NOTE — Telephone Encounter (Signed)
Tried to call pt and call would not go through.

## 2014-11-13 NOTE — Telephone Encounter (Signed)
If she is having 3 good BMS daily I would not advise her to take Linzess. Linzess if for IBS constipation. It will make her have more frequent bowel movments.   Bentyl if for IBS diarrhea and abdominal cramping and can make constipation worse.   OV as scheduled to discuss further.  See if patient can bring any records from her urgent care visit ie labs or xrays.

## 2014-11-13 NOTE — Telephone Encounter (Signed)
Called, Many rings and no answer.

## 2014-11-14 NOTE — Telephone Encounter (Signed)
PT is aware. Said they did not do any labs or x-rays when she went to the urgent care.

## 2014-12-09 ENCOUNTER — Ambulatory Visit: Payer: BC Managed Care – PPO | Admitting: Gastroenterology

## 2014-12-09 ENCOUNTER — Encounter: Payer: Self-pay | Admitting: Gastroenterology

## 2014-12-09 ENCOUNTER — Telehealth: Payer: Self-pay | Admitting: Gastroenterology

## 2014-12-09 NOTE — Telephone Encounter (Signed)
PATIENT WAS A NO SHOW AND LETTER WAS SENT  °

## 2015-01-02 ENCOUNTER — Ambulatory Visit (INDEPENDENT_AMBULATORY_CARE_PROVIDER_SITE_OTHER): Payer: Medicare Other | Admitting: Gastroenterology

## 2015-01-02 ENCOUNTER — Encounter: Payer: Self-pay | Admitting: Gastroenterology

## 2015-01-02 VITALS — BP 138/81 | HR 67 | Temp 97.0°F | Ht 65.0 in | Wt 216.4 lb

## 2015-01-02 DIAGNOSIS — R1084 Generalized abdominal pain: Secondary | ICD-10-CM | POA: Diagnosis not present

## 2015-01-02 DIAGNOSIS — K589 Irritable bowel syndrome without diarrhea: Secondary | ICD-10-CM | POA: Diagnosis not present

## 2015-01-02 NOTE — Patient Instructions (Addendum)
1. Try taking Bentyl (dicyclomine) only when you have abdominal cramping. May use 20mg  at one time but no more than four times per day. 2. If you have constipation, try Linzess once daily on empty stomach INSTEAD of Miralax. It may be contributing to your bloating. HOLD for diarrhea. 3. Try FDgard, 2 capsules twice a day 30 minutes before or after food. This may help your bloating and gas. Samples provided.  4. Call in two weeks with progress report. If no improvement, we will consider further work up.

## 2015-01-02 NOTE — Progress Notes (Signed)
Primary Care Physician: Geroge Baseman  Primary Gastroenterologist:  Barney Drain, MD   Chief Complaint  Patient presents with  . Abdominal Pain    HPI: Courtney Rogers is a 64 y.o. female here for follow. Last seen in 12/2013. She has h/o IBS, GERD, hepatic steatosis.   No heartburn. No dysphagia. Poor appetite. No weight loss. Worse appetite with anxiety. Eats wrong things at night, eats in the bed. BM 2-3 times per day. Taking Miralax several times per week. No melena, brbpr. Bloating intermittently. Intermittent abdominal cramping, sporadic, cannot relate to food. Pain better after BM. Flares more frequent. At least weekly for the past couple of months. Takes pepto or alka setzer, or extra bentyl.       Current Outpatient Prescriptions  Medication Sig Dispense Refill  . albuterol (PROVENTIL HFA;VENTOLIN HFA) 108 (90 BASE) MCG/ACT inhaler Inhale 2 puffs into the lungs every 6 (six) hours as needed for shortness of breath.    Marland Kitchen aspirin EC 81 MG tablet Take 81 mg by mouth at bedtime.    . B Complex-C (B-COMPLEX WITH VITAMIN C) tablet Take 1 tablet by mouth daily.    . Calcium Carbonate-Vitamin D (CALCIUM PLUS VITAMIN D PO) Take 1 tablet by mouth 2 (two) times daily.    . clonazePAM (KLONOPIN) 0.5 MG tablet Take 0.5 mg by mouth 2 (two) times daily.     Marland Kitchen dicyclomine (BENTYL) 20 MG tablet Take 10 mg by mouth 2 (two) times daily.     . fexofenadine (ALLEGRA) 180 MG tablet Take 180 mg by mouth daily.      Marland Kitchen gabapentin (NEURONTIN) 300 MG capsule Take 300 mg by mouth 2 (two) times daily.     . hydrochlorothiazide (HYDRODIURIL) 12.5 MG tablet Take 12.5 mg by mouth daily. Takes 1/2 tablet daily    . losartan (COZAAR) 50 MG tablet Take 100 mg by mouth daily.     . meclizine (ANTIVERT) 25 MG tablet Take 12.5 mg by mouth 3 (three) times daily as needed.     . metoprolol tartrate (LOPRESSOR) 25 MG tablet Take 12 mg by mouth 2 (two) times daily.    . montelukast (SINGULAIR) 10 MG  tablet Take 10 mg by mouth daily.     Marland Kitchen omeprazole (PRILOSEC) 20 MG capsule Take 20 mg by mouth daily.     . polyethylene glycol (MIRALAX / GLYCOLAX) packet Take 17 g by mouth daily as needed.     . potassium chloride SA (K-DUR,KLOR-CON) 20 MEQ tablet Take 40 mEq by mouth 2 (two) times daily.     . Probiotic Product (PROBIOTIC PO) Take 1 capsule by mouth daily.      . sertraline (ZOLOFT) 100 MG tablet Take 100 mg by mouth daily. Takes one and one half tablet daily    . simvastatin (ZOCOR) 40 MG tablet Take 40 mg by mouth at bedtime.      . tolterodine (DETROL LA) 4 MG 24 hr capsule Take 4 mg by mouth at bedtime.     Marland Kitchen warfarin (COUMADIN) 5 MG tablet Take 5 mg by mouth daily.      No current facility-administered medications for this visit.    Allergies as of 01/02/2015 - Review Complete 01/02/2015  Allergen Reaction Noted  . Biaxin [clarithromycin] Other (See Comments)   . Lisinopril Swelling    Past Medical History  Diagnosis Date  . AMI (acute mesenteric ischemia) 2009    CAD/ no stent medically managed  . CAD (  coronary artery disease)     medically managed  . Anxiety disorder   . Hyperlipidemia   . Hypertension   . Overactive bladder   . IBS (irritable bowel syndrome)   . Abdominal pain   . Dyspepsia   . Nephropathy     screening  . PE (pulmonary embolism) JUN 2014   Past Surgical History  Procedure Laterality Date  . Bso secondary to cyst    . Abdominal hysterectomy    . Esophagogastroduodenoscopy  05/11/09    schatzki ring/small hiatal hernia/path:gastritis  . Colonoscopy  12/2009    Dr. West Carbo, propofol, normal. Next TCS 12/2019  . Esophagogastroduodenoscopy (egd) with esophageal dilation N/A 04/01/2013    ACZ:[id number] at the gastroesophageal juction/multiple small polyps/mild gastritis    ROS:  General: Negative for anorexia, weight loss, fever, chills, fatigue, weakness. ENT: Negative for hoarseness, difficulty swallowing , nasal congestion. CV: Negative  for chest pain, angina, palpitations, dyspnea on exertion, peripheral edema.  Respiratory: Negative for dyspnea at rest, dyspnea on exertion, cough, sputum, wheezing.  GI: See history of present illness. GU:  Negative for dysuria, hematuria, urinary incontinence, urinary frequency, nocturnal urination.  Endo: Negative for unusual weight change.    Physical Examination:   BP 138/81 mmHg  Pulse 67  Temp(Src) 97 F (36.1 C) (Oral)  Ht 5\' 5"  (1.651 m)  Wt 216 lb 6.4 oz (98.158 kg)  BMI 36.01 kg/m2  General: Well-nourished, well-developed in no acute distress.  Eyes: No icterus. Mouth: Oropharyngeal mucosa moist and pink , no lesions erythema or exudate. Lungs: Clear to auscultation bilaterally.  Heart: Regular rate and rhythm, no murmurs rubs or gallops.  Abdomen: Bowel sounds are normal, nontender, nondistended, no hepatosplenomegaly or masses, no abdominal bruits or hernia , no rebound or guarding.   Extremities: No lower extremity edema. No clubbing or deformities. Neuro: Alert and oriented x 4   Skin: Warm and dry, no jaundice.   Psych: Alert and cooperative, normal mood and affect.   Imaging Studies: No results found.

## 2015-01-08 NOTE — Assessment & Plan Note (Signed)
Abdominal cramping more frequent than baseline some improvement with BM. Suspect IBS flare. Bloating may be exacerbated by Miralax. No improvement with daily bentyl.  Recommend using bentyl only with flares. Stop Miralax, if needs something for constipation, then use Linzess provided by previous provider. Trial of FDGuard. Call with PR. If ongoing symptoms consider further work up.

## 2015-01-09 NOTE — Progress Notes (Signed)
cc'ed to pcp °

## 2015-01-27 ENCOUNTER — Telehealth: Payer: Self-pay | Admitting: Gastroenterology

## 2015-01-27 NOTE — Telephone Encounter (Signed)
Patient called to see if she needed a rx for FB Guard or was it OTC. I told her that DS said it was OTC and wouldn't need a rx for it. Then she asked if there was one for IBS. I told her that DS would check on that and would have to call her back. Patient said if she doesn't answer to leave message on VM. [phone]

## 2015-01-27 NOTE — Telephone Encounter (Signed)
I called pt and she is not home. LM for her to return the call. 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 For symptoms of early fullness with a meal, nausea, bloating and belching.  IB gard is for IBS , abdominal pain, bloating and diarrhea, constipation and urgency and gas.  The FD gard is what Neil Crouch, PA gave her samples of at her last office visit.

## 2015-01-28 NOTE — Telephone Encounter (Signed)
Pt is aware the FD Guard is OTC. She wants Neil Crouch, PA to know that it is not helping her belching. She wants to know if she could try the IB Guard, would that help more. Please advise!

## 2015-01-28 NOTE — Telephone Encounter (Signed)
She can try IBGuard as directed on package.  Please give her FODMAP sheet and have her limit these foods as much as possible to see if helps with her symptoms.  Avoid chewing gum, eating hard candy, drinking from straw as this too will add more air into GI track.

## 2015-01-28 NOTE — Telephone Encounter (Signed)
Tried to call and could not leave a message.  

## 2015-01-29 NOTE — Telephone Encounter (Signed)
Pt is aware and the FODMAP is in the mail to her.

## 2015-07-04 NOTE — Progress Notes (Signed)
REVIEWED-NO ADDITIONAL RECOMMENDATIONS. 

## 2015-07-21 ENCOUNTER — Emergency Department (HOSPITAL_COMMUNITY): Payer: Medicare Other

## 2015-07-21 ENCOUNTER — Emergency Department (HOSPITAL_COMMUNITY)
Admission: EM | Admit: 2015-07-21 | Discharge: 2015-07-21 | Disposition: A | Discharge status: Home / Self Care | Payer: Medicare Other | Attending: Emergency Medicine | Admitting: Emergency Medicine

## 2015-07-21 ENCOUNTER — Encounter (HOSPITAL_COMMUNITY): Payer: Self-pay

## 2015-07-21 DIAGNOSIS — J019 Acute sinusitis, unspecified: Secondary | ICD-10-CM | POA: Insufficient documentation

## 2015-07-21 DIAGNOSIS — I2510 Atherosclerotic heart disease of native coronary artery without angina pectoris: Secondary | ICD-10-CM | POA: Insufficient documentation

## 2015-07-21 DIAGNOSIS — Z7982 Long term (current) use of aspirin: Secondary | ICD-10-CM | POA: Diagnosis not present

## 2015-07-21 DIAGNOSIS — E785 Hyperlipidemia, unspecified: Secondary | ICD-10-CM | POA: Diagnosis not present

## 2015-07-21 DIAGNOSIS — R109 Unspecified abdominal pain: Secondary | ICD-10-CM | POA: Insufficient documentation

## 2015-07-21 DIAGNOSIS — Z86711 Personal history of pulmonary embolism: Secondary | ICD-10-CM | POA: Insufficient documentation

## 2015-07-21 DIAGNOSIS — M545 Low back pain: Secondary | ICD-10-CM | POA: Diagnosis not present

## 2015-07-21 DIAGNOSIS — Z7901 Long term (current) use of anticoagulants: Secondary | ICD-10-CM | POA: Diagnosis not present

## 2015-07-21 DIAGNOSIS — F419 Anxiety disorder, unspecified: Secondary | ICD-10-CM | POA: Insufficient documentation

## 2015-07-21 DIAGNOSIS — R51 Headache: Secondary | ICD-10-CM | POA: Diagnosis present

## 2015-07-21 DIAGNOSIS — I10 Essential (primary) hypertension: Secondary | ICD-10-CM | POA: Insufficient documentation

## 2015-07-21 DIAGNOSIS — R350 Frequency of micturition: Secondary | ICD-10-CM | POA: Insufficient documentation

## 2015-07-21 DIAGNOSIS — K589 Irritable bowel syndrome without diarrhea: Secondary | ICD-10-CM | POA: Insufficient documentation

## 2015-07-21 DIAGNOSIS — Z79899 Other long term (current) drug therapy: Secondary | ICD-10-CM | POA: Insufficient documentation

## 2015-07-21 DIAGNOSIS — Z87448 Personal history of other diseases of urinary system: Secondary | ICD-10-CM | POA: Diagnosis not present

## 2015-07-21 DIAGNOSIS — R6889 Other general symptoms and signs: Secondary | ICD-10-CM

## 2015-07-21 LAB — URINALYSIS, ROUTINE W REFLEX MICROSCOPIC
Bilirubin Urine: NEGATIVE
Glucose, UA: NEGATIVE mg/dL
Hgb urine dipstick: NEGATIVE
Ketones, ur: NEGATIVE mg/dL
Leukocytes, UA: NEGATIVE
Nitrite: NEGATIVE
Protein, ur: NEGATIVE mg/dL
Specific Gravity, Urine: 1.015 (ref 1.005–1.030)
pH: 6.5 (ref 5.0–8.0)

## 2015-07-21 MED ORDER — DOXYCYCLINE HYCLATE 100 MG PO TABS
100.0000 mg | ORAL_TABLET | Freq: Once | ORAL | Status: AC
  Administered 2015-07-21: 100 mg via ORAL
  Filled 2015-07-21: qty 1

## 2015-07-21 MED ORDER — HYDROCODONE-ACETAMINOPHEN 5-325 MG PO TABS: 1.0000 | ORAL_TABLET | Freq: Four times a day (QID) | ORAL | Status: DC | PRN

## 2015-07-21 MED ORDER — DOXYCYCLINE HYCLATE 100 MG PO CAPS: 100.0000 mg | ORAL_CAPSULE | Freq: Two times a day (BID) | ORAL | Status: DC

## 2015-07-21 MED ORDER — IBUPROFEN 400 MG PO TABS: 400.0000 mg | ORAL_TABLET | Freq: Once | ORAL | Status: DC

## 2015-07-21 MED ORDER — ACETAMINOPHEN 500 MG PO TABS
ORAL_TABLET | ORAL | Status: DC
Start: 2015-07-21 — End: 2015-07-22
  Filled 2015-07-21: qty 2

## 2015-07-21 MED ORDER — DM-GUAIFENESIN ER 30-600 MG PO TB12: 1.0000 | ORAL_TABLET | Freq: Two times a day (BID) | ORAL | Status: DC

## 2015-07-21 NOTE — ED Provider Notes (Signed)
CSN: [id number]     Arrival date & time 07/21/15  1832 History  By signing my name below, I, Helane Gunther, attest that this documentation has been prepared under the direction and in the presence of Fredia Sorrow, MD. Electronically Signed: Helane Gunther, ED Scribe. 07/21/2015. 8:40 PM.    Chief Complaint  Patient presents with  . Flank Pain   The history is provided by the patient. No language interpreter was used.   HPI Comments: Courtney Rogers is a 65 y.o. female with a PMHx of nephropathy, dyspepsia, overactive bladder, CAD, AMI, HTN, HLD, and PE who presents to the Emergency Department complaining of lower back pain onset 2 weeks ago. She notes the pain was intermittent at first, but has since grown increasingly constant. She also reports sore throat, sinus congestion, occasionally productive cough, nausea, and generalized myalgias. She notes she was recently given HCTZ due to having fluid in her lungs, which has caused her to have more frequent urination. She also endorses occasional abdominal pain, which she states is baseline for her due to a PMHX of IBS. She notes her PCP has already referred her to an HENT specialist for the sinusitis when she saw him 2 weeks ago doe  General checkup. She states that in the past she has been seen for the sinus issues by Urgent Care and prescribed prednisone with temporary relief. She denies any recent sick contacts. Pt denies v/d and fever.   Past Medical History  Diagnosis Date  . AMI (acute mesenteric ischemia) ([place]) 2009    CAD/ no stent medically managed  . CAD (coronary artery disease)     medically managed  . Anxiety disorder   . Hyperlipidemia   . Hypertension   . Overactive bladder   . IBS (irritable bowel syndrome)   . Abdominal pain   . Dyspepsia   . Nephropathy     screening  . PE (pulmonary embolism) JUN 2014   Past Surgical History  Procedure Laterality Date  . Bso secondary to cyst    . Abdominal hysterectomy    .  Esophagogastroduodenoscopy  05/11/09    schatzki ring/small hiatal hernia/path:gastritis  . Colonoscopy  12/2009    Dr. West Carbo, propofol, normal. Next TCS 12/2019  . Esophagogastroduodenoscopy (egd) with esophageal dilation N/A 04/01/2013    [id number] at the gastroesophageal juction/multiple small polyps/mild gastritis   Family History  Problem Relation Age of Onset  . Colon polyps Neg Hx   . Colon cancer Neg Hx    Social History  Substance Use Topics  . Smoking status: Never Smoker   . Smokeless tobacco: None     Comment: Never smoked   . Alcohol Use: No   OB History    No data available     Review of Systems  Constitutional: Negative for fever and chills.  HENT: Positive for congestion, sinus pressure and sore throat.   Eyes: Negative for visual disturbance.  Respiratory: Positive for cough and wheezing. Negative for shortness of breath.   Cardiovascular: Negative for chest pain and leg swelling.  Gastrointestinal: Positive for nausea and abdominal pain (baseline). Negative for vomiting and diarrhea.  Genitourinary: Positive for frequency and flank pain. Negative for dysuria.  Musculoskeletal: Positive for myalgias and back pain.  Skin: Negative for rash.  Neurological: Positive for headaches.  Hematological: Bruises/bleeds easily.  Psychiatric/Behavioral: Negative for confusion.    Allergies  Biaxin and Lisinopril  Home Medications   Prior to Admission medications   Medication Sig Start Date End  Date Taking? Authorizing Provider  albuterol (PROVENTIL HFA;VENTOLIN HFA) 108 (90 BASE) MCG/ACT inhaler Inhale 2 puffs into the lungs every 6 (six) hours as needed for shortness of breath.   Yes Historical Provider, MD  aspirin EC 81 MG tablet Take 81 mg by mouth at bedtime.   Yes Historical Provider, MD  B Complex-C (B-COMPLEX WITH VITAMIN C) tablet Take 1 tablet by mouth daily.   Yes Historical Provider, MD  Calcium Carbonate-Vitamin D (CALCIUM PLUS VITAMIN D PO) Take  1 tablet by mouth 2 (two) times daily.   Yes Historical Provider, MD  clonazePAM (KLONOPIN) 0.5 MG tablet Take 0.5 mg by mouth every morning. *Takes an additional tablet at bedtime as needed   Yes Historical Provider, MD  dicyclomine (BENTYL) 20 MG tablet Take 20 mg by mouth 2 (two) times daily.    Yes Historical Provider, MD  fexofenadine (ALLEGRA) 180 MG tablet Take 180 mg by mouth daily.     Yes Historical Provider, MD  gabapentin (NEURONTIN) 300 MG capsule Take 300 mg by mouth 2 (two) times daily.    Yes Historical Provider, MD  hydrochlorothiazide (HYDRODIURIL) 12.5 MG tablet Take 25 mg by mouth daily.    Yes Historical Provider, MD  losartan (COZAAR) 100 MG tablet Take 100 mg by mouth daily.   Yes Historical Provider, MD  metoprolol tartrate (LOPRESSOR) 25 MG tablet Take 12.5 mg by mouth 2 (two) times daily.    Yes Historical Provider, MD  montelukast (SINGULAIR) 10 MG tablet Take 10 mg by mouth daily.    Yes Historical Provider, MD  omeprazole (PRILOSEC) 20 MG capsule Take 20 mg by mouth daily.    Yes Historical Provider, MD  polyethylene glycol (MIRALAX / GLYCOLAX) packet Take 17 g by mouth daily as needed for mild constipation or moderate constipation.    Yes Historical Provider, MD  potassium chloride 20 MEQ/15ML (10%) SOLN Take 40 mEq by mouth 2 (two) times daily.   Yes Historical Provider, MD  Probiotic Product (PROBIOTIC PO) Take 1 capsule by mouth daily. [facility].   Yes Historical Provider, MD  sertraline (ZOLOFT) 100 MG tablet Take 150 mg by mouth daily.    Yes Historical Provider, MD  simvastatin (ZOCOR) 40 MG tablet Take 40 mg by mouth at bedtime.     Yes Historical Provider, MD  tolterodine (DETROL LA) 4 MG 24 hr capsule Take 4 mg by mouth at bedtime.    Yes Historical Provider, MD  warfarin (COUMADIN) 5 MG tablet Take 5 mg by mouth every evening.    Yes Historical Provider, MD  dextromethorphan-guaiFENesin (MUCINEX DM) 30-600 MG 12hr tablet Take 1 tablet by mouth 2  (two) times daily. 07/21/15   Fredia Sorrow, MD  doxycycline (VIBRAMYCIN) 100 MG capsule Take 1 capsule (100 mg total) by mouth 2 (two) times daily. 07/21/15   Fredia Sorrow, MD  HYDROcodone-acetaminophen (NORCO/VICODIN) 5-325 MG tablet Take 1 tablet by mouth every 6 (six) hours as needed. 07/21/15   Fredia Sorrow, MD   BP 129/81 mmHg  Pulse 89  Temp(Src) 98.2 F (36.8 C) (Oral)  Resp 18  Ht 5\' 5"  (1.651 m)  Wt 96.616 kg  BMI 35.44 kg/m2  SpO2 96% Physical Exam  Constitutional: She is oriented to person, place, and time. She appears well-developed and well-nourished.  HENT:  Head: Normocephalic and atraumatic.  Mouth/Throat: Uvula is midline and oropharynx is clear and moist. No oropharyngeal exudate.  Some postnasal drainage, no swelling or erytherma, uvula midline  Eyes: Conjunctivae and  EOM are normal. Pupils are equal, round, and reactive to light. Right eye exhibits no discharge. Left eye exhibits no discharge. No scleral icterus.  Cardiovascular: Normal rate, regular rhythm and normal heart sounds.   No murmur heard. Pulmonary/Chest: Effort normal and breath sounds normal. No respiratory distress.  Lungs are CTA  Abdominal: Soft. Bowel sounds are normal.  Musculoskeletal: She exhibits no edema.  Neurological: She is alert and oriented to person, place, and time. No cranial nerve deficit. She exhibits normal muscle tone. Coordination normal.  Skin: Skin is warm and dry. No rash noted. She is not diaphoretic. No erythema.  Psychiatric: She has a normal mood and affect.  Nursing note and vitals reviewed.   ED Course  Procedures  DIAGNOSTIC STUDIES: Oxygen Saturation is 96% on RA, adequate by my interpretation.    COORDINATION OF CARE: 8:26 PM - Discussed XR and urinalysis results, as well as probable viral URI and plans to review pt's medical record to determine further treatment. Pt advised of plan for treatment and pt agrees.  Labs Review Labs Reviewed  URINALYSIS,  ROUTINE W REFLEX MICROSCOPIC (NOT AT [facility])    Imaging Review Dg Chest 2 View  07/21/2015  CLINICAL DATA:  65 year old female with shortness of breath and productive cough EXAM: CHEST  2 VIEW COMPARISON:  Radiograph dated 10/12/2013 FINDINGS: Two views of the chest demonstrate linear and platelike atelectatic changes/scarring of the lung bases. This findings are relatively similar to the study dated 10/12/2013. Underlying infiltrate is less likely but not excluded. No focal consolidation, pleural effusion, or pneumothorax. Mild cardiomegaly. Left pectoral pacemaker device. Degenerative changes of the spine. No acute fracture. IMPRESSION: Bibasilar linear atelectasis/ scarring.  No focal consolidation. Electronically Signed   By: Anner Crete M.D.   On: 07/21/2015 20:07   I have personally reviewed and evaluated these images and lab results as part of my medical decision-making.   EKG Interpretation None      MDM   Final diagnoses:  Flu-like symptoms  Acute sinusitis, recurrence not specified, unspecified location    Patient symptoms consistent with flulike illness. But been greater than 48 hours and not a candidate for Tamiflu. Patient also clinically seems to have some sinusitis. Also a pharyngitis without any exudate. Will give a trial of an antibiotic that will cover of both the throat in the sinuses with doxycycline. First dose to be provided here. Patient given some hydrocodone for the bodyaches and pain. And patient also prescribe Mucinex DM for the cough and congestion. Patient will follow-up with primary care doctor. Patient will return for any new or worse symptoms.  I personally performed the services described in this documentation, which was scribed in my presence. The recorded information has been reviewed and is accurate.     Fredia Sorrow, MD 07/21/15 2053

## 2015-07-21 NOTE — ED Notes (Signed)
Patient reports of body aches, lower back pain with frequent urination. Patient states she has been taking HCTZ recently.

## 2015-07-21 NOTE — Discharge Instructions (Signed)
Take the antibiotic as directed. Take the Mucinex DM for phlegm and cough. Take hydrocodone as needed for pain. Make appointment to follow-up with your doctor. Return for any new or worse symptoms.

## 2015-10-14 ENCOUNTER — Telehealth: Payer: Self-pay | Admitting: Gastroenterology

## 2015-10-14 NOTE — Telephone Encounter (Signed)
Pt called to see if SF could call something in for her [facility]) or any recommendations. She is having heartburn and feels like a bubble is stuck in her chest and having a lot of gas. Please advise if she needs an OV.

## 2015-10-14 NOTE — Telephone Encounter (Addendum)
Pt said she has eaten more salads recently and she thinks that is causing her problem.  She said it feels like a bubble in her chest that won't quite go away. No pain.   She takes Prilosec 20 mg qd.  She has had this little bubble feeling for about 3 days now.  Please advise!

## 2015-10-14 NOTE — Telephone Encounter (Signed)
Would also offer her follow up ov.

## 2015-10-14 NOTE — Telephone Encounter (Signed)
Courtney Rogers called and discussed with me. She would like for pt to try some gas-x or mylanta this afternoon to see if it relieves the bubble in her chest. Pt is aware if it doesn't she should call her cardiologist and let him know. Also, if she has SOB, sweating, chest pain, neck pain, arm pain or the bubble does not go away, she should go to the ED. Pt is aware.

## 2015-10-15 NOTE — Telephone Encounter (Signed)
I scheduled her for 11/03/2015 at 10:00 Am with you.

## 2015-11-03 ENCOUNTER — Ambulatory Visit (INDEPENDENT_AMBULATORY_CARE_PROVIDER_SITE_OTHER): Payer: Medicare Other | Admitting: Gastroenterology

## 2015-11-03 ENCOUNTER — Encounter: Payer: Self-pay | Admitting: Gastroenterology

## 2015-11-03 VITALS — BP 151/86 | HR 90 | Temp 97.2°F | Ht 65.0 in | Wt 211.6 lb

## 2015-11-03 DIAGNOSIS — K760 Fatty (change of) liver, not elsewhere classified: Secondary | ICD-10-CM

## 2015-11-03 DIAGNOSIS — K219 Gastro-esophageal reflux disease without esophagitis: Secondary | ICD-10-CM

## 2015-11-03 DIAGNOSIS — F411 Generalized anxiety disorder: Secondary | ICD-10-CM | POA: Diagnosis not present

## 2015-11-03 DIAGNOSIS — K589 Irritable bowel syndrome without diarrhea: Secondary | ICD-10-CM | POA: Diagnosis not present

## 2015-11-03 NOTE — Patient Instructions (Signed)
1. Please follow up with your family doctor to discuss management of your anxiety. If you go back on Celexa we will need to change omeprazole to another medication. Just let me know and I can help make that change.  2. I will retrieve copy of your last labs from PCP and make sure you are up to day for liver labs.  3. Return to the office in six months to see Dr. Oneida Alar.

## 2015-11-03 NOTE — Progress Notes (Signed)
Primary Care Physician: Geroge Baseman  Primary Gastroenterologist:  Barney Drain, MD   Chief Complaint  Patient presents with  . Follow-up    HPI: Courtney Rogers is a 65 y.o. female here for one-year follow-up. She has a history of IBS, GERD, hepatic steatosis. She called last month with complaints of heartburn and feeling like she had gas in her chest. Advised to try Gas-X or Mylanta and if no relief consider discussing with her cardiology go to the ER with any warning symptoms.  At this time patient states she's been having no issues with typical heartburn. She feels indigestion or gas is in her chest intermittently. Denies dysphagia. She has had a decreased appetite. Her biggest concern is that of anxiety. She states she stopped clonazepam cold [place] 2 weeks ago. She had been getting the prescription from her neurologist that she does not see regularly. The pharmacist had advised her that it could be addictive forming and she decided she didn't want to be on the medication and stopped it. She reports seeing her PCP who advised her to take clonazepam as needed if she felt anxious. Patient states that she feels anxious all the time was Zoloft 150 mg daily not helping. She does not want be on clonazepam. She wonders what her other options are.She states Celexa seemed to help her in the past.  Otherwise from GI standpoint she is doing fairly well. Denies any melena or rectal bleeding. No constipation or diarrhea. Uses Bentyl as needed.   Current Outpatient Prescriptions  Medication Sig Dispense Refill  . albuterol (PROVENTIL HFA;VENTOLIN HFA) 108 (90 BASE) MCG/ACT inhaler Inhale 2 puffs into the lungs every 6 (six) hours as needed for shortness of breath.    Marland Kitchen aspirin EC 81 MG tablet Take 81 mg by mouth at bedtime.    . B Complex-C (B-COMPLEX WITH VITAMIN C) tablet Take 1 tablet by mouth daily.    . Calcium Carbonate-Vitamin D (CALCIUM PLUS VITAMIN D PO) Take 1 tablet by  mouth 2 (two) times daily.    Marland Kitchen dextromethorphan-guaiFENesin (MUCINEX DM) 30-600 MG 12hr tablet Take 1 tablet by mouth 2 (two) times daily. 14 tablet 1  . dicyclomine (BENTYL) 20 MG tablet Take 20 mg by mouth 2 (two) times daily.     . fexofenadine (ALLEGRA) 180 MG tablet Take 180 mg by mouth daily.      Marland Kitchen gabapentin (NEURONTIN) 300 MG capsule Take 300 mg by mouth 2 (two) times daily.     . hydrochlorothiazide (HYDRODIURIL) 12.5 MG tablet Take 25 mg by mouth daily.     Marland Kitchen losartan (COZAAR) 100 MG tablet Take 100 mg by mouth daily.    . metoprolol tartrate (LOPRESSOR) 25 MG tablet Take 12.5 mg by mouth 2 (two) times daily.     . montelukast (SINGULAIR) 10 MG tablet Take 10 mg by mouth daily.     Marland Kitchen omeprazole (PRILOSEC) 20 MG capsule Take 20 mg by mouth daily.     . polyethylene glycol (MIRALAX / GLYCOLAX) packet Take 17 g by mouth daily as needed for mild constipation or moderate constipation.     . potassium chloride 20 MEQ/15ML (10%) SOLN Take 40 mEq by mouth 2 (two) times daily.    . Probiotic Product (PROBIOTIC PO) Take 1 capsule by mouth daily. [employer].    . sertraline (ZOLOFT) 100 MG tablet Take 150 mg by mouth daily.     . simvastatin (ZOCOR) 40 MG tablet Take 40  mg by mouth at bedtime.      . sucralfate (CARAFATE) 1 GM/10ML suspension Take 1 g by mouth 2 (two) times daily.    Marland Kitchen tolterodine (DETROL LA) 4 MG 24 hr capsule Take 4 mg by mouth at bedtime.     Marland Kitchen warfarin (COUMADIN) 5 MG tablet Take 5 mg by mouth every evening.      No current facility-administered medications for this visit.    Allergies as of 11/03/2015 - Review Complete 11/03/2015  Allergen Reaction Noted  . Biaxin [clarithromycin] Other (See Comments)   . Lisinopril Swelling     ROS:  General: Negative for anorexia, weight loss, fever, chills, fatigue, weakness. ENT: Negative for hoarseness, difficulty swallowing , nasal congestion. CV: Negative for chest pain, angina, palpitations, dyspnea on  exertion, peripheral edema.  Respiratory: Negative for dyspnea at rest, dyspnea on exertion, cough, sputum, wheezing.  GI: See history of present illness. GU:  Negative for dysuria, hematuria, urinary incontinence, urinary frequency, nocturnal urination.  Endo: Negative for unusual weight change.    Physical Examination:   BP 151/86 mmHg  Pulse 90  Temp(Src) 97.2 F (36.2 C) (Oral)  Ht 5\' 5"  (1.651 m)  Wt 211 lb 9.6 oz (95.981 kg)  BMI 35.21 kg/m2  General: Well-nourished, well-developed in no acute distress.  Eyes: No icterus. Mouth: Oropharyngeal mucosa moist and pink , no lesions erythema or exudate. Lungs: Clear to auscultation bilaterally.  Heart: Regular rate and rhythm, no murmurs rubs or gallops.  Abdomen: Bowel sounds are normal, nontender, nondistended, no hepatosplenomegaly or masses, no abdominal bruits or hernia , no rebound or guarding.   Extremities: No lower extremity edema. No clubbing or deformities. Neuro: Alert and oriented x 4   Skin: Warm and dry, no jaundice.   Psych: Alert and cooperative, normal mood and affect.

## 2015-11-05 NOTE — Progress Notes (Signed)
cc'ed to pcp °

## 2015-11-05 NOTE — Assessment & Plan Note (Signed)
Overall seems to be doing fairly well her anxiety is poorly controlled. Continue current regimen. Reinforced anti-reflex measures. Requested she discussed anxiety again with her PCP. Patient does not want to take clonazepam but has significant anxiety on a daily basis at this point.

## 2015-11-05 NOTE — Assessment & Plan Note (Signed)
Doing fairly well on Bentyl at this time.

## 2015-11-05 NOTE — Assessment & Plan Note (Signed)
Follow-up on most recent labs from PCP.  Instructions for fatty liver: Recommend 1-2# weight loss per week until ideal body weight through exercise & diet. Low fat/cholesterol diet.   Avoid sweets, sodas, fruit juices, sweetened beverages like tea, etc. Gradually increase exercise from 15 min daily up to 1 hr per day 5 days/week. Limit alcohol use.

## 2015-11-16 ENCOUNTER — Telehealth: Payer: Self-pay | Admitting: Gastroenterology

## 2015-11-16 NOTE — Addendum Note (Signed)
Addended by: Everardo All on: 11/16/2015 02:54 PM   Modules accepted: Medications

## 2015-11-16 NOTE — Telephone Encounter (Signed)
I spoke to the patient. She said she IS NOT taking Clonazepam now. I told her that it is not on our med list anyway. She has added Celexa 20 mg daily in addition to Zoloft 150 mg qd.  I have added the Celexa to the medication list. Routing to Walden Field, NP for [facility] and any recommendations in the absence of Neil Crouch, [place].

## 2015-11-16 NOTE — Telephone Encounter (Signed)
[phone]  Patient called to notify of a medication change.  Patient was taking clonazpam, and is not taking celexa    Needs to know if that is ok to take with the current medication we have put her on at this office.  Please advise.

## 2015-11-16 NOTE — Telephone Encounter (Signed)
I'm presuming interactions between Celexa and carafate/Bentyl (the only medications I can find that we've started her on). I ran an interaction check and no interactions found.   Please notify the patient.

## 2015-11-16 NOTE — Telephone Encounter (Signed)
Pt is aware.  

## 2015-11-30 ENCOUNTER — Telehealth: Payer: Self-pay

## 2015-11-30 NOTE — Telephone Encounter (Signed)
Pt called and said she is back on Celexa now. Wants to know what PPI she needs to change to now, she is on Celexa. Please advise!

## 2015-12-01 NOTE — Telephone Encounter (Signed)
LM for pt to call

## 2015-12-01 NOTE — Telephone Encounter (Signed)
Pt is on Celexa 40 mg. I have called the Pantoprazole 40 mg #30 to take one daily with 11 refills to [place] at [facility].  Pt will let cardiology of the change in her PPI.

## 2015-12-01 NOTE — Telephone Encounter (Signed)
Find out what dose of celexa she is on. If greater than 20mg /day then we will need to switch PPI.  Would consider pantoprazole 40mg  daily, #30, 11 refills if covered. She should let whoever does her coumadin checks know if the PPI switch if we have to change it.

## 2015-12-02 NOTE — Telephone Encounter (Signed)
Thanks

## 2015-12-08 NOTE — Progress Notes (Signed)
11/11/15 tbili 0.3, AP86, AST 24, ALT 17, alb 3.9.

## 2015-12-17 ENCOUNTER — Encounter: Payer: Self-pay | Admitting: Gastroenterology

## 2016-03-29 ENCOUNTER — Encounter: Payer: Self-pay | Admitting: Gastroenterology

## 2016-04-07 ENCOUNTER — Encounter: Payer: Self-pay | Admitting: Gastroenterology

## 2016-04-07 ENCOUNTER — Ambulatory Visit (INDEPENDENT_AMBULATORY_CARE_PROVIDER_SITE_OTHER): Payer: Medicare Other | Admitting: Gastroenterology

## 2016-04-07 VITALS — BP 110/62 | HR 80 | Temp 98.2°F | Ht 67.0 in | Wt 188.8 lb

## 2016-04-07 DIAGNOSIS — K30 Functional dyspepsia: Secondary | ICD-10-CM | POA: Diagnosis not present

## 2016-04-07 DIAGNOSIS — K581 Irritable bowel syndrome with constipation: Secondary | ICD-10-CM | POA: Diagnosis not present

## 2016-04-07 DIAGNOSIS — K219 Gastro-esophageal reflux disease without esophagitis: Secondary | ICD-10-CM | POA: Diagnosis not present

## 2016-04-07 DIAGNOSIS — F411 Generalized anxiety disorder: Secondary | ICD-10-CM | POA: Diagnosis not present

## 2016-04-07 DIAGNOSIS — K760 Fatty (change of) liver, not elsewhere classified: Secondary | ICD-10-CM

## 2016-04-07 MED ORDER — BUSPIRONE HCL 5 MG PO TABS: 5.0000 mg | ORAL_TABLET | Freq: Two times a day (BID) | ORAL | Status: DC

## 2016-04-07 NOTE — Assessment & Plan Note (Addendum)
SYMPTOMS NOT IDEALLY CONTROLLED BUT MAY BE EXACERBATED BY MEDS: BENTYL/CRAFATE AND UNCONTROLLED ANXIETY.   FOLLOW A FODMAP DIET.  HANDOUT GIVEN. ADD BUSPAR 5 MG TWICE DAILY. PLEASE CALL IN ONE MONTH IF YOUR GI SYMPTOMS ARE NOT CONTROLLED. WILL TITRATE TO EFFECT/TOLERANCE.  STOP TAKING BENTYL AND CARAFATE. USE MIRALAX ONCE OR TWICE DAILY IF NEEDED TO PREVENT CONSTIPATION. FOLLOW UP IN 6 MOS.

## 2016-04-07 NOTE — Progress Notes (Signed)
CC'ED TO PCP 

## 2016-04-07 NOTE — Progress Notes (Signed)
ON RECALL  °

## 2016-04-07 NOTE — Patient Instructions (Addendum)
FOLLOW A FODMAP DIET. SEE HANDOUT.  ADD BUSPAR 5 MG TWICE DAILY. PLEASE CALL IN ONE MONTH IF YOUR GI SYMPTOMS ARE NOT CONTROLLED.  STOP TAKING BENTYL AND CARAFATE.  CONTINUE PROTONIX. TAKE 30 MINUTES PRIOR TO BREAKFAST.  USE MIRALAX 1 SCOOP DAILY IN 8 OZ OF LIQUID ONCE OR TWICE DAILY IF NEEDED TO PREVENT CONSTIPATION.  FOLLOW UP IN 4 MOS.

## 2016-04-07 NOTE — Assessment & Plan Note (Signed)
LOST WEIGHT, BMI NOW < 30. LAST HF NO 2015.  CONTINUE TO MONITOR SYMPTOMS. OBTAIN LABS FROM PCP.

## 2016-04-07 NOTE — Assessment & Plan Note (Signed)
SYMPTOMS NOT IDEALLY CONTROLLED.  ADD BUSPAR 5 MG TWICE DAILY. PLEASE CALL IN ONE MOTH IF YOUR GI SYMPTOMS ARE NOT CONTROLLED. FOLLOW UP IN 6 MOS.

## 2016-04-07 NOTE — Progress Notes (Signed)
Subjective:    Patient ID: Courtney Rogers, female    DOB: 07-Feb-1951, 65 y.o.   MRN: [id number] CLAGGETT,ELIN, PA-C  HPI LAST SEEN JUN 2017: 211 lbs. WAS TAKING CELEXA AND ZOLOFT TOGETHER. NOW UP TO ZOLOFT 200 MG DAILY. WORRIES ABOUT HER HEALTH A LOT-BAD THINGS. DOESN'T FEEL OPTIMISTIC IN THE AM. Doesn't want to take Klonopin. BOWELS-TREATED FOR CONSTIPATION. HAS HEARTBURN SOMETIMES. BMS: USU 2-3X(#4). IF MORE HARD STOOL THEN USES MIRALAX. FEELS LIKE SHE HAS GAS UNDER HER MOSTLY RIGHT. ABDOMEN: MOST OF THE TIME, OFF AND ON, FOR PAST 2 WEEKS EVERY. WAKES UP WITH A ND GOES TO BED WITH IT.  DOESN'T KEEP HER AWAKE. DRINKING TEA. DRINK ENSURE AND LOST HER APPETITE. WEIGHT DOWN: 188 LBS.  PT DENIES FEVER, CHILLS, HEMATOCHEZIA, HEMATEMESIS, nausea, vomiting, melena, diarrhea, CHEST PAIN, SHORTNESS OF BREATH,  CHANGE IN BOWEL IN HABITS, constipation,  OR problems swallowing.  Past Medical History:  Diagnosis Date  . Abdominal pain   . AMI (acute mesenteric ischemia) ([place]) 2009   CAD/ no stent medically managed  . Anxiety disorder   . CAD (coronary artery disease)    medically managed  . Dyspepsia   . Hyperlipidemia   . Hypertension   . IBS (irritable bowel syndrome)   . Nephropathy    screening  . Overactive bladder   . PE (pulmonary embolism) JUN 2014   Past Surgical History:  Procedure Laterality Date  . ABDOMINAL HYSTERECTOMY    . BSO secondary to cyst    . COLONOSCOPY  12/2009   Dr. West Carbo, propofol, normal. Next TCS 12/2019  . ESOPHAGOGASTRODUODENOSCOPY  05/11/09   schatzki ring/small hiatal hernia/path:gastritis  . ESOPHAGOGASTRODUODENOSCOPY (EGD) WITH ESOPHAGEAL DILATION N/A 04/01/2013   [id number] at the gastroesophageal juction/multiple small polyps/mild gastritis   Allergies  Allergen Reactions  . Biaxin [Clarithromycin] Other (See Comments)    Stomach problems   . Lisinopril Swelling   Current Outpatient Prescriptions  Medication Sig Dispense Refill  .  albuterol (PROVENTIL HFA;VENTOLIN HFA) 108 (90 BASE) MCG/ACT inhaler Inhale 2 puffs into the lungs every 6 (six) hours as needed for shortness of breath.    Marland Kitchen aspirin EC 81 MG tablet Take 81 mg by mouth at bedtime.    . B Complex-C (B-COMPLEX WITH VITAMIN C) tablet Take 1 tablet by mouth daily.    . Calcium Carbonate-Vitamin D (CALCIUM PLUS VITAMIN D PO) Take 1 tablet by mouth 2 (two) times daily.    . chlorthalidone 25 MG tablet Take 25 mg by mouth daily.    Marland Kitchen dextromethorphan-guaiFENesin (MUCINEX DM) 30-600 MG 12hr tablet Take 1 tablet by mouth 2 (two) times daily. (Patient taking differently: Take 1 tablet by mouth as needed. )    . dicyclomine (BENTYL) 20 MG tablet Take 20 mg by mouth 2 (two) times daily.     . fexofenadine (ALLEGRA) 180 MG tablet Take 180 mg by mouth daily.      . fluticasone (FLONASE) 50 MCG/ACT nasal spray Place into both nostrils daily.    Marland Kitchen gabapentin (NEURONTIN) 300 MG capsule Take 300 mg by mouth 2 (two) times daily. 300mg  in morning and 600mg  at night    . losartan (COZAAR) 100 MG tablet Take 100 mg by mouth daily.    . metoprolol tartrate (LOPRESSOR) 25 MG tablet Take 50 mg by mouth 2 (two) times daily.     . montelukast (SINGULAIR) 10 MG tablet Take 10 mg by mouth daily.     . pantoprazole (PROTONIX) 40 MG tablet Take  40 mg by mouth daily.    . polyethylene glycol (MIRALAX / GLYCOLAX) packet Take 17 g by mouth daily as needed for mild constipation or moderate constipation.     . potassium chloride 20 MEQ/15ML (10%) SOLN Take 40 mEq by mouth 2 (two) times daily.    . Probiotic Product (PROBIOTIC PO) Take 1 capsule by mouth daily. [facility].    . sertraline (ZOLOFT) 100 MG tablet Take 200 mg by mouth daily.     . simvastatin (ZOCOR) 40 MG tablet Take 40 mg by mouth at bedtime.      Marland Kitchen CARAFATE     . tolterodine (DETROL LA) 4 MG 24 hr capsule Take 4 mg by mouth at bedtime.     Marland Kitchen warfarin (COUMADIN) 5 MG tablet Take 5 mg by mouth every evening.     .      .  hydrochlorothiazide 12.5 MG  Take 25 mg by mouth daily.     .       Review of Systems PER HPI OTHERWISE ALL SYSTEMS ARE NEGATIVE.    Objective:   Physical Exam  Constitutional: She is oriented to person, place, and time. She appears well-developed and well-nourished. No distress.  HENT:  Head: Normocephalic and atraumatic.  Mouth/Throat: Oropharynx is clear and moist. No oropharyngeal exudate.  Eyes: Pupils are equal, round, and reactive to light. No scleral icterus.  Neck: Normal range of motion. Neck supple.  Cardiovascular: Normal rate, regular rhythm and normal heart sounds.   Pulmonary/Chest: Effort normal and breath sounds normal. No respiratory distress.  Abdominal: Soft. Bowel sounds are normal. She exhibits no distension. There is no tenderness. There is no rebound and no guarding.  Feels pressure in Ruq and LLQ with palpation  Musculoskeletal: She exhibits no edema.  Lymphadenopathy:    She has no cervical adenopathy.  Neurological: She is alert and oriented to person, place, and time.  NO  NEW FOCAL DEFICITS  Psychiatric:  FLAT AFFECT, ANXIOUS MOOD   Vitals reviewed.     Assessment & Plan:

## 2016-04-07 NOTE — Assessment & Plan Note (Signed)
SYMPTOMS FAIRLY WELL CONTROLLED.  CONTINUE PROTONIX. TAKE 30 MINUTES PRIOR TO BREAKFAST. AVOID TRIGGERS

## 2016-04-11 ENCOUNTER — Telehealth: Payer: Self-pay | Admitting: Gastroenterology

## 2016-04-11 NOTE — Telephone Encounter (Signed)
Refills called to [place] at [facility].

## 2016-04-11 NOTE — Telephone Encounter (Signed)
[phone]  PATIENT CALLED AND STATED [facility] WAS GOING TO CALL IN SOMETHING FOR HER IBS AND IT COULD POSSIBLY ALSO HELP HER WITH HER ANXIETY , SHE BELIEVES IT IS BUSPAR.  IT WAS NOT AT HER PHARMACY Friday AND SHE IS WANTING TO KNOW IF IT WILL BE CALLED IN.

## 2016-04-11 NOTE — Telephone Encounter (Signed)
Looks like the prescription did not go through. I have called it to [place] at [facility]. Buspar 5 mg #60 to take one bid and no refills.  Tried to call pt on both numbers to let her know it has been taken care of and there were many rings and no answer on both.

## 2016-04-11 NOTE — Telephone Encounter (Signed)
REVIEWED. Pt needs refills authorized for one year.

## 2016-04-11 NOTE — Telephone Encounter (Signed)
LMOM for pt that the Rx was sent to the pharmacy and to call if questions.

## 2016-06-30 ENCOUNTER — Encounter: Payer: Self-pay | Admitting: Gastroenterology

## 2016-07-05 ENCOUNTER — Other Ambulatory Visit: Payer: Self-pay | Admitting: Gastroenterology

## 2016-10-06 ENCOUNTER — Ambulatory Visit: Payer: Medicare Other | Admitting: Gastroenterology

## 2016-10-06 ENCOUNTER — Telehealth: Payer: Self-pay | Admitting: Gastroenterology

## 2016-10-06 NOTE — Telephone Encounter (Signed)
REVIEWED-NO ADDITIONAL RECOMMENDATIONS. 

## 2016-10-06 NOTE — Progress Notes (Deleted)
   Subjective:    Patient ID: Courtney Rogers, female    DOB: 22-May-1951, 66 y.o.   MRN: [id number]  HPI    Review of Systems     Objective:   Physical Exam        Assessment & Plan:

## 2016-10-06 NOTE — Telephone Encounter (Signed)
Pt was a no show

## 2016-12-19 ENCOUNTER — Emergency Department (HOSPITAL_COMMUNITY): Payer: Medicare Other

## 2016-12-19 ENCOUNTER — Emergency Department (HOSPITAL_COMMUNITY)
Admission: EM | Admit: 2016-12-19 | Discharge: 2016-12-19 | Disposition: A | Discharge status: Home / Self Care | Payer: Medicare Other | Attending: Emergency Medicine | Admitting: Emergency Medicine

## 2016-12-19 ENCOUNTER — Encounter (HOSPITAL_COMMUNITY): Payer: Self-pay | Admitting: Emergency Medicine

## 2016-12-19 DIAGNOSIS — Z7901 Long term (current) use of anticoagulants: Secondary | ICD-10-CM | POA: Diagnosis not present

## 2016-12-19 DIAGNOSIS — Z7982 Long term (current) use of aspirin: Secondary | ICD-10-CM | POA: Diagnosis not present

## 2016-12-19 DIAGNOSIS — I10 Essential (primary) hypertension: Secondary | ICD-10-CM | POA: Insufficient documentation

## 2016-12-19 DIAGNOSIS — I2510 Atherosclerotic heart disease of native coronary artery without angina pectoris: Secondary | ICD-10-CM | POA: Insufficient documentation

## 2016-12-19 DIAGNOSIS — Z79899 Other long term (current) drug therapy: Secondary | ICD-10-CM | POA: Diagnosis not present

## 2016-12-19 DIAGNOSIS — K2100 Gastro-esophageal reflux disease with esophagitis, without bleeding: Secondary | ICD-10-CM

## 2016-12-19 DIAGNOSIS — R079 Chest pain, unspecified: Secondary | ICD-10-CM | POA: Diagnosis present

## 2016-12-19 LAB — BASIC METABOLIC PANEL
Anion gap: 9 (ref 5–15)
BUN: 14 mg/dL (ref 6–20)
CO2: 29 mmol/L (ref 22–32)
Calcium: 8.9 mg/dL (ref 8.9–10.3)
Chloride: 100 mmol/L — ABNORMAL LOW (ref 101–111)
Creatinine, Ser: 0.83 mg/dL (ref 0.44–1.00)
GFR calc Af Amer: >60 mL/min (ref >60)
GFR calc non Af Amer: >60 mL/min (ref >60)
Glucose, Bld: 96 mg/dL (ref 65–99)
Potassium: 3.6 mmol/L (ref 3.5–5.1)
Sodium: 138 mmol/L (ref 135–145)

## 2016-12-19 LAB — CBC
HCT: 31.6 % — ABNORMAL LOW (ref 36.0–46.0)
Hemoglobin: 10.2 g/dL — ABNORMAL LOW (ref 12.0–15.0)
MCH: 27.4 pg (ref 26.0–34.0)
MCHC: 32.3 g/dL (ref 30.0–36.0)
MCV: 84.9 fL (ref 78.0–100.0)
Platelets: 193 10*3/uL (ref 150–400)
RBC: 3.72 MIL/uL — ABNORMAL LOW (ref 3.87–5.11)
RDW: 15.9 % — ABNORMAL HIGH (ref 11.5–15.5)
WBC: 7.9 10*3/uL (ref 4.0–10.5)

## 2016-12-19 LAB — TROPONIN I: Troponin I: <0.03 ng/mL (ref <0.03)

## 2016-12-19 MED ORDER — GI COCKTAIL ~~LOC~~
30.0000 mL | Freq: Once | ORAL | Status: AC
  Administered 2016-12-19: 30 mL via ORAL
  Filled 2016-12-19: qty 30

## 2016-12-19 NOTE — ED Triage Notes (Signed)
Pt states her chest has felt funny x 3 days. Pt states she has tried to use her inhalers with no results.

## 2016-12-19 NOTE — Discharge Instructions (Signed)
Increase your protonic to take 1 pill twice a day for the next 4 days. Then go back to 1 pill a day. Follow-up with your doctor next week for recheck

## 2016-12-19 NOTE — ED Provider Notes (Signed)
[facility] DEPT Provider Note   CSN: [id number] Arrival date & time: 12/19/16  1907     History   Chief Complaint Chief Complaint  Patient presents with  . Chest Pain    HPI TASHEMA TILLER is a 66 y.o. female.  Patient complains of pain to her upper abdomen and lower chest. She states it's burning and feels like when her GERD is acting up   The history is provided by the patient. No language interpreter was used.  Chest Pain   This is a recurrent problem. The problem occurs rarely. The problem has not changed since onset.Associated with: Unknown. The pain is present in the epigastric region. The pain is at a severity of 3/10. The pain is mild. The quality of the pain is described as burning. Pertinent negatives include no abdominal pain, no back pain, no cough and no headaches.  Pertinent negatives for past medical history include no seizures.    Past Medical History:  Diagnosis Date  . Abdominal pain   . Acute mesenteric ischemia ([place])   . AMI (acute mesenteric ischemia) ([place]) 2009   CAD/ no stent medically managed  . Anxiety disorder   . CAD (coronary artery disease)    medically managed  . CAD (coronary artery disease)   . Dyspepsia   . Hyperlipidemia   . Hypertension   . IBS (irritable bowel syndrome)   . Nephropathy    screening  . Overactive bladder   . PE (pulmonary embolism) JUN 2014    Patient Active Problem List   Diagnosis Date Noted  . Fatty liver 11/03/2015  . Anxiety state 11/03/2015  . Weight loss 04/05/2011  . GERD 06/01/2010  . IRRITABLE BOWEL SYNDROME 07/21/2009  . Non-ulcer dyspepsia 04/30/2009    Past Surgical History:  Procedure Laterality Date  . ABDOMINAL HYSTERECTOMY    . BSO secondary to cyst    . COLONOSCOPY  12/2009   Dr. West Carbo, propofol, normal. Next TCS 12/2019  . ESOPHAGOGASTRODUODENOSCOPY  05/11/09   schatzki ring/small hiatal hernia/path:gastritis  . ESOPHAGOGASTRODUODENOSCOPY (EGD) WITH ESOPHAGEAL DILATION N/A  04/01/2013   [id number] at the gastroesophageal juction/multiple small polyps/mild gastritis    OB History    No data available       Home Medications    Prior to Admission medications   Medication Sig Start Date End Date Taking? Authorizing Provider  albuterol (PROVENTIL HFA;VENTOLIN HFA) 108 (90 BASE) MCG/ACT inhaler Inhale 2 puffs into the lungs every 6 (six) hours as needed for shortness of breath.   Yes [provider]  amLODipine (NORVASC) 10 MG tablet Take 10 mg by mouth daily.   Yes [provider]  Ascorbic Acid (VITAMIN C PO) Take 1 tablet by mouth daily.   Yes [provider]  aspirin EC 81 MG tablet Take 81 mg by mouth at bedtime.   Yes [provider]  B Complex-C (B-COMPLEX WITH VITAMIN C) tablet Take 1 tablet by mouth daily.   Yes [provider]  bismuth subsalicylate (PEPTO-BISMOL TO-GO) 262 MG chewable tablet Chew 524 mg by mouth daily as needed for indigestion.   Yes [provider]  busPIRone (BUSPAR) 5 MG tablet Take 5 mg by mouth 2 (two) times daily.   Yes [provider]  chlorthalidone (HYGROTON) 25 MG tablet Take 25 mg by mouth daily.   Yes [provider]  fluticasone (FLONASE) 50 MCG/ACT nasal spray Place into both nostrils daily.   Yes [provider]  gabapentin (NEURONTIN) 300 MG  capsule Take 300 mg by mouth 3 (three) times daily. 300mg  in morning and 600mg  at night   Yes [provider]  losartan (COZAAR) 100 MG tablet Take 100 mg by mouth daily.   Yes [provider]  metoprolol succinate (TOPROL-XL) 50 MG 24 hr tablet Take 1 tablet by mouth daily. 11/25/16  Yes [provider]  montelukast (SINGULAIR) 10 MG tablet Take 10 mg by mouth daily.    Yes [provider]  nitroGLYCERIN (NITROSTAT) 0.4 MG SL tablet Place 1-5 tablets under the tongue as needed. Take 1 to 5 tablets as needed for emergency chest pain   Yes [provider]    pantoprazole (PROTONIX) 40 MG tablet TAKE 1 TABLET BY MOUTH ONCE DAILY. 07/07/16  Yes Carlis Stable, NP  polyethylene glycol (MIRALAX / GLYCOLAX) packet Take 17 g by mouth daily as needed for mild constipation or moderate constipation.    Yes [provider]  potassium chloride (K-DUR,KLOR-CON) 10 MEQ tablet Take 20 mEq by mouth 2 (two) times daily.   Yes [provider]  Probiotic Product (PROBIOTIC PO) Take 1 capsule by mouth daily. [facility].   Yes [provider]  sertraline (ZOLOFT) 100 MG tablet Take 200 mg by mouth daily.    Yes [provider]  simvastatin (ZOCOR) 40 MG tablet Take 40 mg by mouth at bedtime.     Yes [provider]  tolterodine (DETROL LA) 4 MG 24 hr capsule Take 4 mg by mouth at bedtime.    Yes [provider]  warfarin (COUMADIN) 5 MG tablet Take 2.5-5 mg by mouth daily. Patient takes 5mg  on Monday, Tuesday, Wednesday, Thursday, and Friday; on Saturday and Sunday, patient takes 2.5mg    Yes [provider]  busPIRone (BUSPAR) 5 MG tablet Take 5 mg by mouth 2 (two) times daily. 11/25/16   [provider]    Family History Family History  Problem Relation Age of Onset  . Colon polyps Neg Hx   . Colon cancer Neg Hx     Social History Social History  Substance Use Topics  . Smoking status: Never Smoker  . Smokeless tobacco: Never Used     Comment: Never smoked   . Alcohol use No     Allergies   Biaxin [clarithromycin] and Lisinopril   Review of Systems Review of Systems  Constitutional: Negative for appetite change and fatigue.  HENT: Negative for congestion, ear discharge and sinus pressure.   Eyes: Negative for discharge.  Respiratory: Negative for cough.   Cardiovascular: Positive for chest pain.  Gastrointestinal: Negative for abdominal pain and diarrhea.  Genitourinary: Negative for frequency and hematuria.  Musculoskeletal: Negative for back pain.  Skin: Negative for  rash.  Neurological: Negative for seizures and headaches.  Psychiatric/Behavioral: Negative for hallucinations.     Physical Exam Updated Vital Signs BP 124/68   Pulse 63   Temp 98.1 F (36.7 C)   Resp 19   Ht 5\' 4"  (1.626 m)   Wt 84.8 kg (187 lb)   SpO2 100%   BMI 32.10 kg/m   Physical Exam  Constitutional: She is oriented to person, place, and time. She appears well-developed.  HENT:  Head: Normocephalic.  Eyes: Conjunctivae and EOM are normal. No scleral icterus.  Neck: Neck supple. No thyromegaly present.  Cardiovascular: Normal rate and regular rhythm.  Exam reveals no gallop and no friction rub.   No murmur heard. Pulmonary/Chest: No stridor. She has no wheezes. She has no  rales. She exhibits no tenderness.  Abdominal: She exhibits no distension. There is tenderness. There is no rebound.  Mild epigastric tenderness  Musculoskeletal: Normal range of motion. She exhibits no edema.  Lymphadenopathy:    She has no cervical adenopathy.  Neurological: She is oriented to person, place, and time. She exhibits normal muscle tone. Coordination normal.  Skin: No rash noted. No erythema.  Psychiatric: She has a normal mood and affect. Her behavior is normal.     ED Treatments / Results  Labs (all labs ordered are listed, but only abnormal results are displayed) Labs Reviewed  BASIC METABOLIC PANEL - Abnormal; Notable for the following:       Result Value   Chloride 100 (*)    All other components within normal limits  CBC - Abnormal; Notable for the following:    RBC 3.72 (*)    Hemoglobin 10.2 (*)    HCT 31.6 (*)    RDW 15.9 (*)    All other components within normal limits  TROPONIN I    EKG  EKG Interpretation  Date/Time:  Monday December 19 2016 19:16:29 EDT Ventricular Rate:  75 PR Interval:  154 QRS Duration: 202 QT Interval:  474 QTC Calculation: 529 R Axis:   -73 Text Interpretation:  Atrial-sensed ventricular-paced rhythm Abnormal ECG Confirmed by  Milton Ferguson [id number]) on 12/19/2016 11:29:46 PM       Radiology Dg Chest 2 View  Result Date: 12/19/2016 CLINICAL DATA:  Chest pain. EXAM: CHEST  2 VIEW COMPARISON:  07/21/2015 FINDINGS: The heart size and mediastinal contours are within normal limits. Stable appearance of dual-chamber pacemaker. Stable scarring at both lung bases. There is no evidence of pulmonary edema, consolidation, pneumothorax, nodule or pleural fluid. The visualized skeletal structures are unremarkable. IMPRESSION: Stable pulmonary scarring.  No acute findings. Electronically Signed   By: Aletta Edouard M.D.   On: 12/19/2016 19:55    Procedures Procedures (including critical care time)  Medications Ordered in ED Medications  gi cocktail (Maalox,Lidocaine,Donnatal) (30 mLs Oral Given 12/19/16 2108)     Initial Impression / Assessment and Plan / ED Course  I have reviewed the triage vital signs and the nursing notes.  Pertinent labs & imaging results that were available during my care of the patient were reviewed by me and considered in my medical decision making (see chart for details).     Patient improved with GI cocktail. Labs unremarkable. Suspect this is GERD. Patient will increase Protonix to twice a day for the next 4 days and follow-up with her doctor  Final Clinical Impressions(s) / ED Diagnoses   Final diagnoses:  Gastroesophageal reflux disease with esophagitis    New Prescriptions New Prescriptions   No medications on file     Milton Ferguson, MD 12/19/16 2337

## 2016-12-30 ENCOUNTER — Telehealth: Payer: Self-pay | Admitting: Gastroenterology

## 2016-12-30 NOTE — Telephone Encounter (Signed)
[phone] PATIENT CALLED AND STATED SHE NEEDED TO BE SEEN SOONER, HAVING CHEST PAIN AND GI ISSUES.  I ADDED HER TO A CANCELLATION LIST.  PLEASE CALL

## 2017-01-02 ENCOUNTER — Other Ambulatory Visit: Payer: Self-pay

## 2017-01-02 DIAGNOSIS — D649 Anemia, unspecified: Secondary | ICD-10-CM

## 2017-01-02 NOTE — Telephone Encounter (Signed)
I called and informed pt. She has not had any bloody stools and no black stool.  She said sometimes the stools might be a little dark, but it is after she takes Pepto Bismol. She is aware to let us know if she does have rectal bleeding or black stools. She is also aware to do the CBC prior to to [place].

## 2017-01-02 NOTE — Telephone Encounter (Signed)
Reviewed ED records from 12/19/2016. Patient improved for GI cocktail. Upper abdominal pain/chest pain felt to be related GERD. They also increased her protonic to twice a day for several days. Chest x-ray with no acute findings. Single troponin normal. Basic metabolic panel unremarkable. CBC showed hemoglobin of 10.2, hematocrit 31.6. MCV 84.9. Only lab to compare to was from 2014 at which time her hemoglobin was 11.9 and hematocrit normal.   The only other suggestion I have is to make sure patient stays on pantoprazole 40 mg twice a day until she is seen in the office. We can send Rx for #60 with 0 refills if needed.  Also would like for her to repeat her CBC prior to upcoming office visit.  If she having any bloody stool or black stool?

## 2017-01-02 NOTE — Telephone Encounter (Signed)
Rx for the pantoprazole called to [place] at [facility] for 40 mg bid and no refills. Lab order on file.

## 2017-01-02 NOTE — Telephone Encounter (Signed)
I spoke to pt and she was seen in the ED on 12/19/2016 for upper abdominal/ chest pain. They believe it is her reflux. She is still having the symptoms from time to time. She said she has gas in the right side of her chest some and it radiates beneath her right shoulder blade.  She saw her cardiologist, Dr. Lily Kocher from [place] last week. He also believes it to be her GI problems. He gave her some Carafate and that has helped some. She is aware she is on the Cancellation list and also I have informed her that if she has bad chest pain that doesn't ease up to go to the ED and she said that she would.  Forwarding to Neil Crouch, PA who has seen pt in the past, in Dr. Nona Dell absence.

## 2017-01-02 NOTE — Telephone Encounter (Signed)
noted 

## 2017-01-07 ENCOUNTER — Encounter (HOSPITAL_COMMUNITY): Payer: Self-pay | Admitting: Emergency Medicine

## 2017-01-07 ENCOUNTER — Emergency Department (HOSPITAL_COMMUNITY)
Admission: EM | Admit: 2017-01-07 | Discharge: 2017-01-08 | Disposition: A | Discharge status: Home / Self Care | Payer: Medicare Other | Attending: Emergency Medicine | Admitting: Emergency Medicine

## 2017-01-07 ENCOUNTER — Emergency Department (HOSPITAL_COMMUNITY): Payer: Medicare Other

## 2017-01-07 DIAGNOSIS — I2510 Atherosclerotic heart disease of native coronary artery without angina pectoris: Secondary | ICD-10-CM | POA: Insufficient documentation

## 2017-01-07 DIAGNOSIS — R12 Heartburn: Secondary | ICD-10-CM | POA: Diagnosis present

## 2017-01-07 DIAGNOSIS — R0789 Other chest pain: Secondary | ICD-10-CM | POA: Diagnosis not present

## 2017-01-07 DIAGNOSIS — Z79899 Other long term (current) drug therapy: Secondary | ICD-10-CM | POA: Diagnosis not present

## 2017-01-07 DIAGNOSIS — K219 Gastro-esophageal reflux disease without esophagitis: Secondary | ICD-10-CM | POA: Diagnosis not present

## 2017-01-07 DIAGNOSIS — Z7901 Long term (current) use of anticoagulants: Secondary | ICD-10-CM | POA: Diagnosis not present

## 2017-01-07 DIAGNOSIS — I10 Essential (primary) hypertension: Secondary | ICD-10-CM | POA: Insufficient documentation

## 2017-01-07 DIAGNOSIS — Z7982 Long term (current) use of aspirin: Secondary | ICD-10-CM | POA: Diagnosis not present

## 2017-01-07 LAB — CBC
HCT: 33.3 % — ABNORMAL LOW (ref 36.0–46.0)
Hemoglobin: 10.5 g/dL — ABNORMAL LOW (ref 12.0–15.0)
MCH: 26.1 pg (ref 26.0–34.0)
MCHC: 31.5 g/dL (ref 30.0–36.0)
MCV: 82.8 fL (ref 78.0–100.0)
Platelets: 209 10*3/uL (ref 150–400)
RBC: 4.02 MIL/uL (ref 3.87–5.11)
RDW: 15.6 % — ABNORMAL HIGH (ref 11.5–15.5)
WBC: 7.5 10*3/uL (ref 4.0–10.5)

## 2017-01-07 LAB — URINALYSIS, ROUTINE W REFLEX MICROSCOPIC
Bilirubin Urine: NEGATIVE
Glucose, UA: NEGATIVE mg/dL
Hgb urine dipstick: NEGATIVE
Ketones, ur: NEGATIVE mg/dL
Leukocytes, UA: NEGATIVE
Nitrite: NEGATIVE
Protein, ur: NEGATIVE mg/dL
Specific Gravity, Urine: 1.015 (ref 1.005–1.030)
pH: 8 (ref 5.0–8.0)

## 2017-01-07 LAB — POC OCCULT BLOOD, ED: Fecal Occult Bld: NEGATIVE

## 2017-01-07 LAB — I-STAT CG4 LACTIC ACID, ED: Lactic Acid, Venous: 0.95 mmol/L (ref 0.5–1.9)

## 2017-01-07 MED ORDER — GI COCKTAIL ~~LOC~~
30.0000 mL | Freq: Once | ORAL | Status: AC
  Administered 2017-01-07: 30 mL via ORAL
  Filled 2017-01-07: qty 30

## 2017-01-07 NOTE — ED Triage Notes (Signed)
Pt reports epigastric discomfort unrelieved by antacids Also has had bruises and lumps on her arms for the last 2 weeks  States she spoke w her heart dr who reported it was her coumadin

## 2017-01-07 NOTE — ED Triage Notes (Signed)
Pt c/o burning in the stomach that radiates up her esophagus. Pt state acid will come in her throat x 2 weeks. Pt states she was seen here, [place], and pcp for the same. Pt also c/o bruising to her arms and states she has rectal pressure from her ibs.

## 2017-01-07 NOTE — ED Provider Notes (Signed)
[facility] DEPT Provider Note   CSN: [id number] Arrival date & time: 01/07/17  1959     History   Chief Complaint Chief Complaint  Patient presents with  . Heartburn    HPI Courtney Rogers is a 66 y.o. female.  Patient presents with several week history of burning in her chest and throat that she feels is related to her acid reflux. She was seen on July 30 here for similar symptoms. She was told to increase her Protonix to twice daily but she states she has not been doing this. She's been trying to call Dr. Oneida Alar office but doesn't have an appointment until September. Comes in tonight because she complains of dry throat with more burning in her chest that is worse if she lies down. There is really no association with food. Nothing makes it better. He describes diffuse burning across her throat and chest and upper abdomen. No vomiting but has had nausea. No fever. No lower abdominal pain. Has had dark stools but takes iron and is on Coumadin for history of PE. States she has had an MI before but has no cardiac stents. Denies any excessive alcohol, caffeine or NSAID use. Last EGD was in 2014 that showed gastritis.   The history is provided by the patient.  Heartburn  Pertinent negatives include no chest pain, no abdominal pain, no headaches and no shortness of breath.    Past Medical History:  Diagnosis Date  . Abdominal pain   . Acute mesenteric ischemia ([place])   . AMI (acute mesenteric ischemia) ([place]) 2009   CAD/ no stent medically managed  . Anxiety disorder   . CAD (coronary artery disease)    medically managed  . CAD (coronary artery disease)   . Dyspepsia   . Hyperlipidemia   . Hypertension   . IBS (irritable bowel syndrome)   . Nephropathy    screening  . Overactive bladder   . PE (pulmonary embolism) JUN 2014    Patient Active Problem List   Diagnosis Date Noted  . Fatty liver 11/03/2015  . Anxiety state 11/03/2015  . Weight loss 04/05/2011  . GERD  06/01/2010  . IRRITABLE BOWEL SYNDROME 07/21/2009  . Non-ulcer dyspepsia 04/30/2009    Past Surgical History:  Procedure Laterality Date  . ABDOMINAL HYSTERECTOMY    . BSO secondary to cyst    . COLONOSCOPY  12/2009   Dr. West Carbo, propofol, normal. Next TCS 12/2019  . ESOPHAGOGASTRODUODENOSCOPY  05/11/09   schatzki ring/small hiatal hernia/path:gastritis  . ESOPHAGOGASTRODUODENOSCOPY (EGD) WITH ESOPHAGEAL DILATION N/A 04/01/2013   [id number] at the gastroesophageal juction/multiple small polyps/mild gastritis    OB History    No data available       Home Medications    Prior to Admission medications   Medication Sig Start Date End Date Taking? Authorizing Provider  albuterol (PROVENTIL HFA;VENTOLIN HFA) 108 (90 BASE) MCG/ACT inhaler Inhale 2 puffs into the lungs every 6 (six) hours as needed for shortness of breath.    [provider]  amLODipine (NORVASC) 10 MG tablet Take 10 mg by mouth daily.    [provider]  Ascorbic Acid (VITAMIN C PO) Take 1 tablet by mouth daily.    [provider]  aspirin EC 81 MG tablet Take 81 mg by mouth at bedtime.    [provider]  B Complex-C (B-COMPLEX WITH VITAMIN C) tablet Take 1 tablet by mouth daily.    [provider]  bismuth subsalicylate (PEPTO-BISMOL TO-GO) 262 MG chewable  tablet Chew 524 mg by mouth daily as needed for indigestion.    [provider]  busPIRone (BUSPAR) 5 MG tablet Take 5 mg by mouth 2 (two) times daily. 11/25/16   [provider]  busPIRone (BUSPAR) 5 MG tablet Take 5 mg by mouth 2 (two) times daily.    [provider]  chlorthalidone (HYGROTON) 25 MG tablet Take 25 mg by mouth daily.    [provider]  fluticasone (FLONASE) 50 MCG/ACT nasal spray Place into both nostrils daily.    [provider]  gabapentin (NEURONTIN) 300 MG capsule Take 300 mg by mouth 3 (three) times daily. 300mg  in morning and 600mg  at night     [provider]  losartan (COZAAR) 100 MG tablet Take 100 mg by mouth daily.    [provider]  metoprolol succinate (TOPROL-XL) 50 MG 24 hr tablet Take 1 tablet by mouth daily. 11/25/16   [provider]  montelukast (SINGULAIR) 10 MG tablet Take 10 mg by mouth daily.     [provider]  nitroGLYCERIN (NITROSTAT) 0.4 MG SL tablet Place 1-5 tablets under the tongue as needed. Take 1 to 5 tablets as needed for emergency chest pain    [provider]  pantoprazole (PROTONIX) 40 MG tablet TAKE 1 TABLET BY MOUTH ONCE DAILY. 07/07/16   Carlis Stable, NP  polyethylene glycol (MIRALAX / GLYCOLAX) packet Take 17 g by mouth daily as needed for mild constipation or moderate constipation.     [provider]  potassium chloride (K-DUR,KLOR-CON) 10 MEQ tablet Take 20 mEq by mouth 2 (two) times daily.    [provider]  Probiotic Product (PROBIOTIC PO) Take 1 capsule by mouth daily. [facility].    [provider]  sertraline (ZOLOFT) 100 MG tablet Take 200 mg by mouth daily.     [provider]  simvastatin (ZOCOR) 40 MG tablet Take 40 mg by mouth at bedtime.      [provider]  tolterodine (DETROL LA) 4 MG 24 hr capsule Take 4 mg by mouth at bedtime.     [provider]  warfarin (COUMADIN) 5 MG tablet Take 2.5-5 mg by mouth daily. Patient takes 5mg  on Monday, Tuesday, Wednesday, Thursday, and Friday; on Saturday and Sunday, patient takes 2.5mg     [provider]    Family History Family History  Problem Relation Age of Onset  . Colon polyps Neg Hx   . Colon cancer Neg Hx     Social History Social History  Substance Use Topics  . Smoking status: Never Smoker  . Smokeless tobacco: Never Used     Comment: Never smoked   . Alcohol use No     Allergies   Biaxin [clarithromycin] and Lisinopril   Review of Systems Review of Systems  Constitutional: Negative for activity change,  appetite change and fever.  HENT: Negative for congestion and rhinorrhea.   Respiratory: Positive for chest tightness. Negative for shortness of breath.   Cardiovascular: Negative for chest pain and leg swelling.  Gastrointestinal: Positive for heartburn and nausea. Negative for abdominal pain and vomiting.  Genitourinary: Negative for dysuria, hematuria and vaginal discharge.  Musculoskeletal: Negative.  Negative for back pain and myalgias.  Neurological: Negative for dizziness, weakness, light-headedness and headaches.   all other systems are negative except as noted in the HPI and PMH.     Physical Exam Updated Vital Signs BP 136/78 (BP Location: Right Arm)   Pulse 76  Temp 98.7 F (37.1 C) (Oral)   Resp 18   Ht 5\' 4"  (1.626 m)   Wt 84.8 kg (187 lb)   SpO2 100%   BMI 32.10 kg/m   Physical Exam  Constitutional: She is oriented to person, place, and time. She appears well-developed and well-nourished. No distress.  HENT:  Head: Normocephalic and atraumatic.  Mouth/Throat: Oropharynx is clear and moist. No oropharyngeal exudate.  Eyes: Pupils are equal, round, and reactive to light. Conjunctivae and EOM are normal.  Neck: Normal range of motion. Neck supple.  No meningismus.  Cardiovascular: Normal rate, regular rhythm, normal heart sounds and intact distal pulses.   No murmur heard. Pulmonary/Chest: Effort normal and breath sounds normal. No respiratory distress.  Abdominal: Soft. There is tenderness. There is no rebound and no guarding.  Epigastric tenderness  Genitourinary:  Genitourinary Comments: Chaperone present.  external hemorrhoids present. No gross blood on rectal exam  Musculoskeletal: Normal range of motion. She exhibits no edema or tenderness.  Neurological: She is alert and oriented to person, place, and time. No cranial nerve deficit. She exhibits normal muscle tone. Coordination normal.   5/5 strength throughout. CN 2-12 intact.Equal grip strength.   Skin:  Skin is warm.  Psychiatric: She has a normal mood and affect. Her behavior is normal.  Nursing note and vitals reviewed.    ED Treatments / Results  Labs (all labs ordered are listed, but only abnormal results are displayed) Labs Reviewed  COMPREHENSIVE METABOLIC PANEL - Abnormal; Notable for the following:       Result Value   Potassium 2.7 (*)    Total Protein 8.8 (*)    All other components within normal limits  CBC - Abnormal; Notable for the following:    Hemoglobin 10.5 (*)    HCT 33.3 (*)    RDW 15.6 (*)    All other components within normal limits  URINALYSIS, ROUTINE W REFLEX MICROSCOPIC - Abnormal; Notable for the following:    Color, Urine AMBER (*)    APPearance HAZY (*)    All other components within normal limits  APTT - Abnormal; Notable for the following:    aPTT 67 (*)    All other components within normal limits  PROTIME-INR - Abnormal; Notable for the following:    Prothrombin Time 51.7 (*)    INR >4.01 (*)    All other components within normal limits  PROTIME-INR - Abnormal; Notable for the following:    Prothrombin Time 54.9 (*)    INR >4.01 (*)    All other components within normal limits  POTASSIUM - Abnormal; Notable for the following:    Potassium 2.9 (*)    All other components within normal limits  LIPASE, BLOOD  TROPONIN I  POC OCCULT BLOOD, ED  I-STAT CG4 LACTIC ACID, ED    EKG  EKG Interpretation  Date/Time:  Saturday January 07 2017 23:02:50 EDT Ventricular Rate:  78 PR Interval:    QRS Duration: 188 QT Interval:  460 QTC Calculation: 531 R Axis:   -72 Text Interpretation:  Ventricular-paced complexes No further analysis attempted due to paced rhythm No significant change was found Confirmed by Ezequiel Essex [phone]) on 01/07/2017 11:24:14 PM       Radiology Dg Abdomen Acute W/chest  Result Date: 01/08/2017 CLINICAL DATA:  Burning sensation in chest without upper abdominal pain for week. EXAM: DG ABDOMEN ACUTE W/ 1V CHEST  COMPARISON:  Chest radiograph December 19, 2016 FINDINGS: Cardiomediastinal silhouette is normal. Lungs are clear,  no pleural effusions. LEFT lung base scarring. Dual lead LEFT cardiac pacemaker in situ. No pneumothorax. RIGHT lung base calcified pleural plaque versus old rib fracture. Soft tissue planes and included osseous structures are unremarkable. Bowel gas pattern is nondilated and nonobstructive. No intra-abdominal mass effect, pathologic calcifications or free air. Phleboliths LEFT pelvis. Soft tissue planes and included osseous structures are non-suspicious. IMPRESSION: Stable examination:  No acute cardiopulmonary process. Nonspecific bowel gas pattern. Electronically Signed   By: Elon Alas M.D.   On: 01/08/2017 00:06    Procedures Procedures (including critical care time)  Medications Ordered in ED Medications  gi cocktail (Maalox,Lidocaine,Donnatal) (not administered)     Initial Impression / Assessment and Plan / ED Course  I have reviewed the triage vital signs and the nursing notes.  Pertinent labs & imaging results that were available during my care of the patient were reviewed by me and considered in my medical decision making (see chart for details).     Patient with several week history of heartburn with burning in her stomach, upper abdomen and chest. Has not been taking her PPI as recommended. EKG is paced and unchanged.  Abdomen is soft and nontender. Hemoccult is negative. Hemoglobin is stable. INR is 4.   Patient feels improved after GI cocktail. Potassium was also low. Patient is on chronic potassium. This is repleted. Patient instructed to increase potassium to 40 mEq twice daily for 2 days then go back to 20 mEq twice daily.  INR 5.94. Discussed with lab. Patient with no evidence of bleeding. Hemoglobin is stable Hemoccult is negative. Vitamin k given.  Increased PPI to twice daily. Add Carafate. Follow-up with PCP and GI.  Follow up with PCP for INR check  next week. We'll also need recheck of potassium. Follow up with GI as well.  Return precautions discussed including rectal bleeding or dark or bloody stools.  Final Clinical Impressions(s) / ED Diagnoses   Final diagnoses:  Gastroesophageal reflux disease, esophagitis presence not specified    New Prescriptions New Prescriptions   No medications on file     Ezequiel Essex, MD 01/08/17 0700

## 2017-01-07 NOTE — ED Notes (Signed)
Apologies for wait and informed of delay

## 2017-01-08 DIAGNOSIS — K219 Gastro-esophageal reflux disease without esophagitis: Secondary | ICD-10-CM | POA: Diagnosis not present

## 2017-01-08 LAB — TROPONIN I: Troponin I: <0.03 ng/mL (ref <0.03)

## 2017-01-08 LAB — COMPREHENSIVE METABOLIC PANEL
ALT: 18 U/L (ref 14–54)
AST: 36 U/L (ref 15–41)
Albumin: 3.8 g/dL (ref 3.5–5.0)
Alkaline Phosphatase: 59 U/L (ref 38–126)
Anion gap: 10 (ref 5–15)
BUN: 14 mg/dL (ref 6–20)
CO2: 30 mmol/L (ref 22–32)
Calcium: 9.4 mg/dL (ref 8.9–10.3)
Chloride: 101 mmol/L (ref 101–111)
Creatinine, Ser: 0.84 mg/dL (ref 0.44–1.00)
GFR calc Af Amer: >60 mL/min (ref >60)
GFR calc non Af Amer: >60 mL/min (ref >60)
Glucose, Bld: 95 mg/dL (ref 65–99)
Potassium: 2.7 mmol/L — CL (ref 3.5–5.1)
Sodium: 141 mmol/L (ref 135–145)
Total Bilirubin: 0.7 mg/dL (ref 0.3–1.2)
Total Protein: 8.8 g/dL — ABNORMAL HIGH (ref 6.5–8.1)

## 2017-01-08 LAB — PROTIME-INR
INR: >4.01
INR: >4.01
Prothrombin Time: 51.7 seconds — ABNORMAL HIGH (ref 11.4–15.2)
Prothrombin Time: 54.9 seconds — ABNORMAL HIGH (ref 11.4–15.2)

## 2017-01-08 LAB — APTT: aPTT: 67 seconds — ABNORMAL HIGH (ref 24–36)

## 2017-01-08 LAB — POTASSIUM: Potassium: 2.9 mmol/L — ABNORMAL LOW (ref 3.5–5.1)

## 2017-01-08 LAB — LIPASE, BLOOD: Lipase: 48 U/L (ref 11–51)

## 2017-01-08 MED ORDER — POTASSIUM CHLORIDE CRYS ER 20 MEQ PO TBCR
40.0000 meq | EXTENDED_RELEASE_TABLET | Freq: Once | ORAL | Status: AC
  Administered 2017-01-08: 40 meq via ORAL
  Filled 2017-01-08: qty 2

## 2017-01-08 MED ORDER — SUCRALFATE 1 G PO TABS: 1.0000 g | ORAL_TABLET | Freq: Three times a day (TID) | ORAL | 0 refills | Status: DC

## 2017-01-08 MED ORDER — POTASSIUM CHLORIDE 10 MEQ/100ML IV SOLN
10.0000 meq | INTRAVENOUS | Status: AC
  Administered 2017-01-08 (×2): 10 meq via INTRAVENOUS
  Filled 2017-01-08 (×2): qty 100

## 2017-01-08 MED ORDER — POTASSIUM CHLORIDE CRYS ER 20 MEQ PO TBCR: 20.0000 meq | EXTENDED_RELEASE_TABLET | Freq: Two times a day (BID) | ORAL | 0 refills | Status: DC

## 2017-01-08 MED ORDER — PHYTONADIONE 5 MG PO TABS
5.0000 mg | ORAL_TABLET | Freq: Once | ORAL | Status: AC
  Administered 2017-01-08: 5 mg via ORAL
  Filled 2017-01-08: qty 1

## 2017-01-08 NOTE — Discharge Instructions (Signed)
Increase your Protonix to twice daily. Take Carafate as prescribed. You need to recheck your INR next week. INR was 5.9 today. She also had her potassium rechecked next week. Take 40 mEq of potassium twice daily for 2 days then go back to 20 mEq twice daily. Return to the ED if he develop chest pain, shortness of breath, rectal bleeding, abdominal pain or any other concerns.

## 2017-01-08 NOTE — ED Notes (Signed)
Date and time results received: 01/08/17 0015 (use smartphrase ".now" to insert current time)  Test: INR Critical Value: >4.01  Name of Provider Notified: Dr Wyvonnia Dusky  Orders Received? Or Actions Taken?: no orders received.

## 2017-01-08 NOTE — ED Notes (Signed)
Date and time results received: 01/08/17 0208 (use smartphrase ".now" to insert current time)  Test: inr Critical Value: >4.01  Name of Provider Notified: DR Rancour  Orders Received? Or Actions Taken?: no orders received

## 2017-01-08 NOTE — ED Notes (Signed)
Date and time results received: 01/08/17 0015 (use smartphrase ".now" to insert current time)  Test: potassium Critical Value: 2.7  Name of Provider Notified: dr Wyvonnia Dusky  Orders Received? Or Actions Taken?: no orders received

## 2017-01-12 ENCOUNTER — Ambulatory Visit (INDEPENDENT_AMBULATORY_CARE_PROVIDER_SITE_OTHER): Payer: Medicare Other | Admitting: Gastroenterology

## 2017-01-12 ENCOUNTER — Encounter: Payer: Self-pay | Admitting: Gastroenterology

## 2017-01-12 ENCOUNTER — Other Ambulatory Visit: Payer: Self-pay

## 2017-01-12 ENCOUNTER — Telehealth: Payer: Self-pay

## 2017-01-12 DIAGNOSIS — K581 Irritable bowel syndrome with constipation: Secondary | ICD-10-CM

## 2017-01-12 DIAGNOSIS — K30 Functional dyspepsia: Secondary | ICD-10-CM

## 2017-01-12 DIAGNOSIS — K219 Gastro-esophageal reflux disease without esophagitis: Secondary | ICD-10-CM | POA: Diagnosis not present

## 2017-01-12 DIAGNOSIS — D649 Anemia, unspecified: Secondary | ICD-10-CM | POA: Diagnosis not present

## 2017-01-12 MED ORDER — NA SULFATE-K SULFATE-MG SULF 17.5-3.13-1.6 GM/177ML PO SOLN: 1.0000 | ORAL | 0 refills | Status: DC

## 2017-01-12 MED ORDER — PANTOPRAZOLE SODIUM 40 MG PO TBEC: DELAYED_RELEASE_TABLET | ORAL | 11 refills | Status: DC

## 2017-01-12 MED ORDER — LIDOCAINE VISCOUS 2 % MT SOLN: OROMUCOSAL | 1 refills | Status: DC

## 2017-01-12 MED ORDER — ENOXAPARIN SODIUM 40 MG/0.4ML ~~LOC~~ SOLN: 120.0000 mg | SUBCUTANEOUS | 0 refills | Status: DC

## 2017-01-12 NOTE — Progress Notes (Signed)
cc'ed to pcp °
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
## 2017-01-12 NOTE — Telephone Encounter (Signed)
I spoke with the patient to see who her cardiologist was, during the conversation she stated she has a friend that's a nurse and she will have her administer the Lovenox shots.

## 2017-01-12 NOTE — Patient Instructions (Addendum)
FOR UPSET STOMACH:       1. AVOID REFLUX TRIGGERS. SEE INFO BELOW.       2. CONTINUE [place].       3. USE VISCOUS LIDOCAINE 2 TSP 30 MINS PRIOR TO MEALS TO AVOID  CHEST, OR UPPER ABDOMINAL PAIN OR HEARTBURN. MAY REPEAT A DOSE EVERY 4 HOURS. USE NO MORE THAN 8 DOSES A DAY. IT WILL MAKE YOUR MOUTH, ESOPHAGUS, AND STOMACH NUMB.       4. STOP CARAFATE(SUCRALFATE). CONTINUE PROTONIX. TAKE 30 MINUTES PRIOR TO MEALS TWICE DAILY.  COMPLETE ENDOSCOPY FRI SEP 14.  **HOLD COUMADIN STARTING SEP 9. START LOVENOX SEP 11 AND 12. ** NO LOVENOX SEP 13. FOLLOW CLEAR LIQUID DIET. TAKE SUPREP.   FOLLOW UP IN 4 MOS.     Lifestyle and home remedies TO HELP CONTROL HEARTBURN AND UPSET STOMACH.  You may eliminate or reduce the frequency of heartburn by making the following lifestyle changes:  . Control your weight. Being overweight is a major risk factor for heartburn and GERD. Excess pounds put pressure on your abdomen, pushing up your stomach and causing acid to back up into your esophagus.   . Eat smaller meals. 4 TO 6 MEALS A DAY. This reduces pressure on the lower esophageal sphincter, helping to prevent the valve from opening and acid from washing back into your esophagus.   Dolphus Jenny your belt. Clothes that fit tightly around your waist put pressure on your abdomen and the lower esophageal sphincter.  .  . Eliminate heartburn triggers. Everyone has specific triggers. Common triggers such as fatty or fried foods, spicy food, tomato sauce, carbonated beverages, alcohol, chocolate, mint, garlic, onion, caffeine and nicotine may make heartburn worse.   Marland Kitchen Avoid stooping or bending. Tying your shoes is OK. Bending over for longer periods to weed your garden isn't, especially soon after eating.   . Don't lie down after a meal. Wait at least three to four hours after eating before going to bed, and don't lie down right after eating.   Marland Kitchen PLACE THE HEAD OF YOUR BED ON 6 INCH BLOCKS.  Alternative  medicine . Several home remedies exist for treating GERD, but they provide only temporary relief. They include drinking baking soda (sodium bicarbonate) added to water or drinking other fluids such as baking soda mixed with cream of tartar and water. . Although these liquids create temporary relief by neutralizing, washing away or buffering acids, eventually they aggravate the situation by adding gas and fluid to your stomach, increasing pressure and causing more acid reflux. Further, adding more sodium to your diet may increase your blood pressure and add stress to your heart, and excessive bicarbonate ingestion can alter the acid-base balance in your body.
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
## 2017-01-12 NOTE — Assessment & Plan Note (Signed)
SYMPTOMS NOT IDEALLY CONTROLLED.  CONTINUE PROTONIX. TAKE 30 MINUTES PRIOR TO MEALS AND INCREASE TO TWICE DAILY. AVOID TRIGGERS. FOLLOW UP IN 4 MOS.

## 2017-01-12 NOTE — Addendum Note (Signed)
Addended by: Danie Binder on: 01/12/2017 02:56 PM   Modules accepted: Orders

## 2017-01-12 NOTE — Telephone Encounter (Signed)
REVIEWED-NO ADDITIONAL RECOMMENDATIONS.  Called ANDREW AND ClarifIED order: LOVENOX 120 MG SQ ON SEP 11 AND SEP 12, #QS.

## 2017-01-12 NOTE — Assessment & Plan Note (Addendum)
SYMPTOMS FAIRLY WELL CONTROLLED.   CONTINUE TO MONITOR SYMPTOMS. MIRALAX PRN FOLLOW UP IN 4 MOS.

## 2017-01-12 NOTE — Telephone Encounter (Signed)
Dr. Oneida Alar, please call Mitzi Hansen at [facility] in reference to the Lovenox prescription.  [phone]

## 2017-01-12 NOTE — Progress Notes (Signed)
ON RECALL  °

## 2017-01-12 NOTE — Assessment & Plan Note (Addendum)
WHILE ON COUMADIN. LAST TCS 2011 WITH [facility]. LAST EGD WITH [facility] IN 2014. NO BRBPR OR MELENA.   COMPLETE ENDOSCOPY FRI SEP 14. DISCUSSED PROCEDURE, BENEFITS, & RISKS: < 1% chance of medication reaction, bleeding, perforation, or rupture of spleen/liver. **HOLD COUMADIN STARTING SEP 9. START LOVENOX SEP 11 AND 12. ** NO LOVENOX SEP 13. FOLLOW CLEAR LIQUID DIET. TAKE SUPREP. MAY NEED [facility]IVENS CAPSULE ENDOSCOPY IF NO SOURCE FOR ANEMIA IDENTIFIED.

## 2017-01-30 LAB — CBC WITH DIFFERENTIAL/PLATELET
Basophils Absolute: 42 cells/uL (ref 0–200)
Basophils Relative: 0.7 %
Eosinophils Absolute: 234 cells/uL (ref 15–500)
Eosinophils Relative: 3.9 %
HCT: 31.5 % — ABNORMAL LOW (ref 35.0–45.0)
Hemoglobin: 10.1 g/dL — ABNORMAL LOW (ref 11.7–15.5)
Lymphs Abs: 1902 cells/uL (ref 850–3900)
MCH: 26.2 pg — ABNORMAL LOW (ref 27.0–33.0)
MCHC: 32.1 g/dL (ref 32.0–36.0)
MCV: 81.6 fL (ref 80.0–100.0)
MPV: 10.3 fL (ref 7.5–12.5)
Monocytes Relative: 6 %
Neutro Abs: 3462 cells/uL (ref 1500–7800)
Neutrophils Relative %: 57.7 %
Platelets: 213 10*3/uL (ref 140–400)
RBC: 3.86 10*6/uL (ref 3.80–5.10)
RDW: 15.4 % — ABNORMAL HIGH (ref 11.0–15.0)
Total Lymphocyte: 31.7 %
WBC mixed population: 360 cells/uL (ref 200–950)
WBC: 6 10*3/uL (ref 3.8–10.8)

## 2017-02-01 ENCOUNTER — Telehealth: Payer: Self-pay

## 2017-02-01 NOTE — Telephone Encounter (Signed)
Pt is aware to start back on her Coumadin today and keep her Lovenox until her TCS on 03/10/17.

## 2017-02-01 NOTE — Telephone Encounter (Signed)
Routing to Anton to follow up on the first on October for her Lovenox bridge.

## 2017-02-01 NOTE — Telephone Encounter (Signed)
PLEASE CALL PT. SHE SHOULD RESUME COUMADIN TODAY. KEEP LOVENOX SYRINGES AND WE WILL CALL HER WITH ADDITIONAL INSTRUCTIONS REGARDING WHAT TO DO WITH HER COUMADIN BEFORE HER TCS IN OCT.

## 2017-02-01 NOTE — Progress Notes (Signed)
Dr. Fields, please advise! 
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
## 2017-02-01 NOTE — Progress Notes (Signed)
FYI Dr. Oneida Alar. Patient see by you in office recently and on schedule for EGD/TCS.

## 2017-02-03 NOTE — Telephone Encounter (Signed)
COMPLETE ENDOSCOPY OCT 19.  **HOLD COUMADIN STARTING OCT 14. START LOVENOX OCT 17 AND 18. ** FOLLOW CLEAR LIQUID DIET. TAKE SUPREP.

## 2017-02-06 NOTE — Telephone Encounter (Signed)
New instructions have been made out for her.

## 2017-02-09 ENCOUNTER — Ambulatory Visit: Payer: Medicare Other | Admitting: Gastroenterology

## 2017-03-06 ENCOUNTER — Telehealth: Payer: Self-pay

## 2017-03-06 NOTE — Telephone Encounter (Signed)
Pt called office and LMOVM. She is scheduled for TCS/EGD with [facility] 03/10/17. She is having heart cath done tomorrow and wants to know if she needs to cancel procedure.  Called pt. Procedure canceled. Informed her she will need to an OV to reschedule procedure to update her info since having heart cath. OV with [facility] scheduled for 04/19/17. Called and informed Endo Scheduler.

## 2017-03-10 ENCOUNTER — Ambulatory Visit (HOSPITAL_COMMUNITY): Admission: RE | Admit: 2017-03-10 | Payer: Medicare Other | Source: Ambulatory Visit | Admitting: Gastroenterology

## 2017-03-10 ENCOUNTER — Encounter (HOSPITAL_COMMUNITY): Admission: RE | Payer: Self-pay | Source: Ambulatory Visit

## 2017-03-10 SURGERY — COLONOSCOPY
Anesthesia: Moderate Sedation

## 2017-04-15 ENCOUNTER — Other Ambulatory Visit: Payer: Self-pay | Admitting: Gastroenterology

## 2017-04-19 ENCOUNTER — Other Ambulatory Visit: Payer: Self-pay

## 2017-04-19 ENCOUNTER — Encounter: Payer: Self-pay | Admitting: Gastroenterology

## 2017-04-19 ENCOUNTER — Ambulatory Visit (HOSPITAL_COMMUNITY)
Admission: RE | Admit: 2017-04-19 | Discharge: 2017-04-19 | Disposition: A | Discharge status: Home / Self Care | Payer: Medicare Other | Source: Ambulatory Visit | Attending: Gastroenterology | Admitting: Gastroenterology

## 2017-04-19 ENCOUNTER — Ambulatory Visit (INDEPENDENT_AMBULATORY_CARE_PROVIDER_SITE_OTHER): Payer: Medicare Other | Admitting: Gastroenterology

## 2017-04-19 VITALS — BP 111/71 | HR 85 | Temp 97.4°F | Ht 64.0 in | Wt 189.4 lb

## 2017-04-19 DIAGNOSIS — M533 Sacrococcygeal disorders, not elsewhere classified: Secondary | ICD-10-CM | POA: Insufficient documentation

## 2017-04-19 DIAGNOSIS — R1013 Epigastric pain: Secondary | ICD-10-CM

## 2017-04-19 DIAGNOSIS — K30 Functional dyspepsia: Secondary | ICD-10-CM | POA: Diagnosis not present

## 2017-04-19 DIAGNOSIS — M549 Dorsalgia, unspecified: Secondary | ICD-10-CM | POA: Insufficient documentation

## 2017-04-19 DIAGNOSIS — D649 Anemia, unspecified: Secondary | ICD-10-CM | POA: Diagnosis not present

## 2017-04-19 MED ORDER — NA SULFATE-K SULFATE-MG SULF 17.5-3.13-1.6 GM/177ML PO SOLN: 1.0000 | ORAL | 0 refills | Status: DC

## 2017-04-19 NOTE — Addendum Note (Signed)
Addended by: Danie Binder on: 04/19/2017 10:58 AM   Modules accepted: Orders
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
## 2017-04-19 NOTE — Assessment & Plan Note (Signed)
DIFFERENTIAL DIAGNOSIS INCLUDES: H PYLORI GASTRITIS, LESS LIKELY CELIAC SPRUE, PUD, OR MICROSCOPIC COLITIS.  EGD TO COMPLETE EVALUATION. CONTINUE PROTONIX. TAKE 30 MINUTES PRIOR TO MEALS TWICE DAILY. FOLLOW UP IN 6 MOS.

## 2017-04-19 NOTE — Assessment & Plan Note (Addendum)
PERSISTENT DISCOMFORT IN TAILBONE.  PLAIN FILM OF COCCYX.  WILL CALL WITH RESULTS WHEN AVAILABLE.

## 2017-04-19 NOTE — Patient Instructions (Addendum)
COMPLETE ENDOSCOPY MON DEC 31.  **HOLD COUMADIN STARTING DEC 25. START LOVENOX DEC 29 AND 30 ** NO LOVENOX DEC 31.  **FOLLOW CLEAR LIQUID DIET THE DAY BEFORE ENDOSCOPY. TAKE SUPREP.   FOLLOW UP IN 6 MOS.

## 2017-04-19 NOTE — Progress Notes (Signed)
CC'ED TO PCP 

## 2017-04-19 NOTE — Progress Notes (Signed)
ON RECALL  °

## 2017-04-19 NOTE — Assessment & Plan Note (Addendum)
RARE BRBPR.NO MELENA. DIFFERENTIAL DIAGNOSIS INCLUDES HEMORRHOIDS, COLON POLYPS, AVMs, GASTRITIS, & LESS LIKELY COLON CA.  COMPLETE ENDOSCOPY MON DEC 31. DISCUSSED PROCEDURE, BENEFITS, & RISKS: < 1% chance of medication reaction, bleeding, perforation, or rupture of spleen/liver. RX FOR LOVENOX GIVEN. **HOLD COUMADIN STARTING DEC 25. START LOVENOX DEC 29 AND 30 ** NO LOVENOX DEC 31.  **FOLLOW CLEAR LIQUID DIET THE DAY BEFORE ENDOSCOPY. TAKE SUPREP. WILL NEED GIVENS CAPSULE ENDOSCOPY IF NO SOURCE FOR ANEMIA IDENTIFIED. FOLLOW UP IN 6 MOS.

## 2017-04-20 ENCOUNTER — Telehealth: Payer: Self-pay | Admitting: Gastroenterology

## 2017-04-20 NOTE — Telephone Encounter (Signed)
PT is aware of results and declines the Linzess. Said she has tried it before and she messed up everything. She said she has Miralax and takes it when she does not have a Bm.

## 2017-04-20 NOTE — Telephone Encounter (Signed)
PLEASE CALL PT. HER XRAY SHOWS CONSTIPATION. HER TAILBONE IS NORMAL. SHE HAS DEGENERATIVE DISC DISEASE. STOOL IN HER RECTUM MAY CAUSE TAILBONE PAIN AND LOWER ABDOMINAL PAIN. SHE SHOULD START LINZESS 72 MCG DAILY. SHE CAN PICK UP SAMPLES #8. TAKE WITH BREAKFAST. IT MAY CAUSE EXPLOSIVE DIARRHEA. SHE SHOULD CALL IF I DOESN'T WORK OR IF SHE WOULD LIKE A PRESCRIPTION.

## 2017-04-21 NOTE — Telephone Encounter (Signed)
REVIEWED-NO ADDITIONAL RECOMMENDATIONS. 

## 2017-05-22 ENCOUNTER — Encounter (HOSPITAL_COMMUNITY): Payer: Self-pay

## 2017-05-22 ENCOUNTER — Ambulatory Visit (HOSPITAL_COMMUNITY)
Admission: RE | Admit: 2017-05-22 | Discharge: 2017-05-22 | Disposition: A | Discharge status: Home / Self Care | Payer: Medicare Other | Source: Ambulatory Visit | Attending: Gastroenterology | Admitting: Gastroenterology

## 2017-05-22 ENCOUNTER — Encounter (HOSPITAL_COMMUNITY)
Admission: RE | Disposition: A | Discharge status: Home / Self Care | Payer: Self-pay | Source: Ambulatory Visit | Attending: Gastroenterology

## 2017-05-22 ENCOUNTER — Other Ambulatory Visit: Payer: Self-pay

## 2017-05-22 DIAGNOSIS — K589 Irritable bowel syndrome without diarrhea: Secondary | ICD-10-CM | POA: Diagnosis not present

## 2017-05-22 DIAGNOSIS — Z7901 Long term (current) use of anticoagulants: Secondary | ICD-10-CM | POA: Diagnosis not present

## 2017-05-22 DIAGNOSIS — K644 Residual hemorrhoidal skin tags: Secondary | ICD-10-CM | POA: Diagnosis not present

## 2017-05-22 DIAGNOSIS — Z79899 Other long term (current) drug therapy: Secondary | ICD-10-CM | POA: Insufficient documentation

## 2017-05-22 DIAGNOSIS — N3281 Overactive bladder: Secondary | ICD-10-CM | POA: Diagnosis not present

## 2017-05-22 DIAGNOSIS — E785 Hyperlipidemia, unspecified: Secondary | ICD-10-CM | POA: Insufficient documentation

## 2017-05-22 DIAGNOSIS — R1013 Epigastric pain: Secondary | ICD-10-CM

## 2017-05-22 DIAGNOSIS — K317 Polyp of stomach and duodenum: Secondary | ICD-10-CM | POA: Diagnosis not present

## 2017-05-22 DIAGNOSIS — I2510 Atherosclerotic heart disease of native coronary artery without angina pectoris: Secondary | ICD-10-CM | POA: Insufficient documentation

## 2017-05-22 DIAGNOSIS — Z86711 Personal history of pulmonary embolism: Secondary | ICD-10-CM | POA: Diagnosis not present

## 2017-05-22 DIAGNOSIS — G4730 Sleep apnea, unspecified: Secondary | ICD-10-CM | POA: Insufficient documentation

## 2017-05-22 DIAGNOSIS — I252 Old myocardial infarction: Secondary | ICD-10-CM | POA: Insufficient documentation

## 2017-05-22 DIAGNOSIS — Z950 Presence of cardiac pacemaker: Secondary | ICD-10-CM | POA: Insufficient documentation

## 2017-05-22 DIAGNOSIS — K648 Other hemorrhoids: Secondary | ICD-10-CM | POA: Insufficient documentation

## 2017-05-22 DIAGNOSIS — K2950 Unspecified chronic gastritis without bleeding: Secondary | ICD-10-CM | POA: Diagnosis not present

## 2017-05-22 DIAGNOSIS — I10 Essential (primary) hypertension: Secondary | ICD-10-CM | POA: Diagnosis not present

## 2017-05-22 DIAGNOSIS — D649 Anemia, unspecified: Secondary | ICD-10-CM | POA: Insufficient documentation

## 2017-05-22 DIAGNOSIS — K921 Melena: Secondary | ICD-10-CM | POA: Diagnosis not present

## 2017-05-22 DIAGNOSIS — F419 Anxiety disorder, unspecified: Secondary | ICD-10-CM | POA: Diagnosis not present

## 2017-05-22 DIAGNOSIS — K5730 Diverticulosis of large intestine without perforation or abscess without bleeding: Secondary | ICD-10-CM | POA: Diagnosis not present

## 2017-05-22 HISTORY — PX: ESOPHAGOGASTRODUODENOSCOPY: SHX5428

## 2017-05-22 HISTORY — PX: COLONOSCOPY: SHX5424

## 2017-05-22 HISTORY — DX: Sleep apnea, unspecified: G47.30

## 2017-05-22 HISTORY — DX: Presence of cardiac pacemaker: Z95.0

## 2017-05-22 SURGERY — COLONOSCOPY
Anesthesia: Moderate Sedation

## 2017-05-22 MED ORDER — MIDAZOLAM HCL 5 MG/5ML IJ SOLN
INTRAMUSCULAR | Status: DC | PRN
  Administered 2017-05-22: 2 mg via INTRAVENOUS
  Administered 2017-05-22: 1 mg via INTRAVENOUS
  Administered 2017-05-22: 2 mg via INTRAVENOUS

## 2017-05-22 MED ORDER — MEPERIDINE HCL 100 MG/ML IJ SOLN
INTRAMUSCULAR | Status: AC
  Filled 2017-05-22: qty 2

## 2017-05-22 MED ORDER — MEPERIDINE HCL 100 MG/ML IJ SOLN
INTRAMUSCULAR | Status: DC | PRN
  Administered 2017-05-22: 50 mg
  Administered 2017-05-22 (×2): 25 mg

## 2017-05-22 MED ORDER — MIDAZOLAM HCL 5 MG/5ML IJ SOLN
INTRAMUSCULAR | Status: AC
  Filled 2017-05-22: qty 10

## 2017-05-22 MED ORDER — LIDOCAINE VISCOUS 2 % MT SOLN
OROMUCOSAL | Status: AC
  Filled 2017-05-22: qty 15

## 2017-05-22 MED ORDER — LIDOCAINE VISCOUS 2 % MT SOLN
OROMUCOSAL | Status: DC | PRN
  Administered 2017-05-22: 1 via OROMUCOSAL

## 2017-05-22 MED ORDER — STERILE WATER FOR IRRIGATION IR SOLN
Status: DC | PRN
  Administered 2017-05-22: 10:00:00

## 2017-05-22 MED ORDER — SODIUM CHLORIDE 0.9 % IV SOLN
INTRAVENOUS | Status: DC
  Administered 2017-05-22: 10:00:00 via INTRAVENOUS

## 2017-05-22 NOTE — Op Note (Signed)
[facility] Patient Name: Courtney Rogers Procedure Date: 05/22/2017 9:26 AM MRN: [id number] Date of Birth: June 27, 1950 Attending MD: Barney Drain MD, MD CSN: [id number] Age: 67 Admit Type: Outpatient Procedure:                Upper GI endoscopy WITH COLD FORCEPS BIOPSY Indications:              Dyspepsia, Anemia. PT HAS A PACEMAKER/AMI 2009. ON                            COUMADIN. Providers:                Barney Drain MD, MD, Charlsie Quest. Theda Sers RN, RN,                            Aram Candela Referring MD:             Vidal Schwalbe Medicines:                TCS + Meperidine 25 mg IV Complications:            No immediate complications. Estimated Blood Loss:     Estimated blood loss was minimal. Procedure:                Pre-Anesthesia Assessment:                           - Prior to the procedure, a History and Physical                            was performed, and patient medications and                            allergies were reviewed. The patient's tolerance of                            previous anesthesia was also reviewed. The risks                            and benefits of the procedure and the sedation                            options and risks were discussed with the patient.                            All questions were answered, and informed consent                            was obtained. Prior Anticoagulants: The patient                            last took Coumadin (warfarin) 7 days and Lovenox                            (enoxaparin) 1 day prior to the procedure. ASA  Grade Assessment: II - A patient with mild systemic                            disease. After reviewing the risks and benefits,                            the patient was deemed in satisfactory condition to                            undergo the procedure. After obtaining informed                            consent, the endoscope was passed under direct            vision. Throughout the procedure, the patient's                            blood pressure, pulse, and oxygen saturations were                            monitored continuously. The [id number]) was                            introduced through the mouth, and advanced to the                            second part of duodenum. The upper GI endoscopy was                            accomplished without difficulty. The patient                            tolerated the procedure well. Scope In: 10:44:58 AM Scope Out: 10:54:36 AM Total Procedure Duration: 0 hours 9 minutes 38 seconds  Findings:      The examined esophagus was normal.      Multiple small sessile polyps with no bleeding and no stigmata of recent       bleeding were found in the gastric fundus and in the gastric body.       OTHERWISE GASTRIC MUCOSA APPEARED NORMAL. The polyp was removed with a       cold biopsy forceps. Resection and retrieval were complete. COLD       FORCEP[S BIOPSIES OBTAINED TO EVALUATE FOR ATROPHC GASTRITIS.      The examined duodenum was normal. Biopsies for histology were taken with       a cold forceps for evaluation of celiac disease. Impression:               - Multiple gastric polyps. Resected and retrieved.                           -NO OBVIOUS SOURCE FOR NORMOCYTIC ANEMIA IDENTIFIED. Moderate Sedation:      Moderate (conscious) sedation was administered by the endoscopy nurse       and supervised by the endoscopist. The following parameters were       monitored: oxygen saturation, heart rate, blood  pressure, and response       to care. Total physician intraservice time was 57 minutes. Recommendation:           - Await pathology results. WILL NEED GIVENS CAPSULE                            STUDY IF NO SOURCE FOR ANEMIA IDENTIFIED ON BIOPSY.                           - High fiber diet.                           - Continue present medications. RE-START                             LOVENOX/COUMADIN DEC 31.                           - Return to my office in 5 months.                           - Patient has a contact number available for                            emergencies. The signs and symptoms of potential                            delayed complications were discussed with the                            patient. Return to normal activities tomorrow.                            Written discharge instructions were provided to the                            patient. Procedure Code(s):        --- Professional ---                           [phone], Esophagogastroduodenoscopy, flexible,                            transoral; with biopsy, single or multiple                           99152, Moderate sedation services provided by the                            same physician or other qualified health care                            professional performing the diagnostic or                            therapeutic service that the sedation  supports,                            requiring the presence of an independent trained                            observer to assist in the monitoring of the                            patient's level of consciousness and physiological                            status; initial 15 minutes of intraservice time,                            patient age 78 years or older                           [phone], Moderate sedation services; each additional                            15 minutes intraservice time                           99153, Moderate sedation services; each additional                            15 minutes intraservice time                           99153, Moderate sedation services; each additional                            15 minutes intraservice time Diagnosis Code(s):        --- Professional ---                           K31.7, Polyp of stomach and duodenum                           R10.13, Epigastric pain                            D64.9, Anemia, unspecified CPT copyright 2016 American Medical Association. All rights reserved. The codes documented in this report are preliminary and upon coder review may  be revised to meet current compliance requirements. Barney Drain, MD Barney Drain MD, MD 05/22/2017 11:31:47 AM This report has been signed electronically. Number of Addenda: 0

## 2017-05-22 NOTE — Progress Notes (Signed)
Ecchymosis noted to LLQ of abdomen, bilateral lower legs due to "A Fall before Christmas, and I have seem my neurologist about it, coumadin makes me bruise".

## 2017-05-22 NOTE — Discharge Instructions (Signed)
Your INTERMITTENT Rectal bleeding is due to internal hemorrhoids. NO SOURCE FOR YOUR ANEMIA WAS IDENTIFIED. You have SMALL STOMACH POLYPS. I biopsied your stomach, & SMALL BOWEL.   RE-START LOVENOX DAILY DEC 31. YOUR LAST DOSE WILL BE JAN 2. RE-START COUMADIN TODAY.  FOLLOW A HIGH FIBER DIET. AVOID ITEMS THAT CAUSE BLOATING. SEE INFO BELOW.  YOUR BIOPSY RESULTS WILL BE AVAILABLE IN MY CHART AFTER JAN 4 AND MY OFFICE WILL CONTACT YOU IN 10-14 DAYS WITH YOUR RESULTS. IF NO SOURCE FOR YOUR ANEMIA IS IDENTIFIED YOU WILL NEED A GIVENS CAPSULE ENDOSCOPY TO COMPLETE THE WORKUP FOR YOUR ANEMIA.  YOU SHOULD COMPLETE A BLOOD DRAW FOR IRON STORES(FERRITIN), VITAMIN B12, AND FOLATE.  FOLLOW UP IN MAY 2019.  Next colonoscopy in 10-15 years IF THE BENEFITS OUTWEIGH THE RISKS.    ENDOSCOPY Care After Read the instructions outlined below and refer to this sheet in the next week. These discharge instructions provide you with general information on caring for yourself after you leave the hospital. While your treatment has been planned according to the most current medical practices available, unavoidable complications occasionally occur. If you have any problems or questions after discharge, call DR. Jamille Fisher, [phone].  ACTIVITY  You may resume your regular activity, but move at a slower pace for the next 24 hours.   Take frequent rest periods for the next 24 hours.   Walking will help get rid of the air and reduce the bloated feeling in your belly (abdomen).   No driving for 24 hours (because of the medicine (anesthesia) used during the test).   You may shower.   Do not sign any important legal documents or operate any machinery for 24 hours (because of the anesthesia used during the test).    NUTRITION  Drink plenty of fluids.   You may resume your normal diet as instructed by your doctor.   Begin with a light meal and progress to your normal diet. Heavy or fried foods are harder to  digest and may make you feel sick to your stomach (nauseated).   Avoid alcoholic beverages for 24 hours or as instructed.    MEDICATIONS  You may resume your normal medications.   WHAT YOU CAN EXPECT TODAY  Some feelings of bloating in the abdomen.   Passage of more gas than usual.   Spotting of blood in your stool or on the toilet paper  .  IF YOU HAD POLYPS REMOVED DURING THE ENDOSCOPY:  Eat a soft diet IF YOU HAVE NAUSEA, BLOATING, ABDOMINAL PAIN, OR VOMITING.    FINDING OUT THE RESULTS OF YOUR TEST Not all test results are available during your visit. DR. Oneida Alar WILL CALL YOU WITHIN 14 DAYS OF YOUR PROCEDUE WITH YOUR RESULTS. Do not assume everything is normal if you have not heard from DR. Bernadean Saling, CALL HER OFFICE AT [phone].  SEEK IMMEDIATE MEDICAL ATTENTION AND CALL THE OFFICE: [phone] IF:  You have more than a spotting of blood in your stool.   Your belly is swollen (abdominal distention).   You are nauseated or vomiting.   You have a temperature over 101F.   You have abdominal pain or discomfort that is severe or gets worse throughout the day.     High-Fiber Diet A high-fiber diet changes your normal diet to include more whole grains, legumes, fruits, and vegetables. Changes in the diet involve replacing refined carbohydrates with unrefined foods. The calorie level of the diet is essentially unchanged. The Dietary Reference Intake (recommended  amount) for adult males is 38 grams per day. For adult females, it is 25 grams per day. Pregnant and lactating women should consume 28 grams of fiber per day. Fiber is the intact part of a plant that is not broken down during digestion. Functional fiber is fiber that has been isolated from the plant to provide a beneficial effect in the body.  PURPOSE  Increase stool bulk.   Ease and regulate bowel movements.   Lower cholesterol.   REDUCE RISK OF COLON CANCER  INDICATIONS THAT YOU NEED MORE  FIBER  Constipation and hemorrhoids.   Uncomplicated diverticulosis (intestine condition) and irritable bowel syndrome.   Weight management.   As a protective measure against hardening of the arteries (atherosclerosis), diabetes, and cancer.   GUIDELINES FOR INCREASING FIBER IN THE DIET  Start adding fiber to the diet slowly. A gradual increase of about 5 more grams (2 slices of whole-wheat bread, 2 servings of most fruits or vegetables, or 1 bowl of high-fiber cereal) per day is best. Too rapid an increase in fiber may result in constipation, flatulence, and bloating.   Drink enough water and fluids to keep your urine clear or pale yellow. Water, juice, or caffeine-free drinks are recommended. Not drinking enough fluid may cause constipation.   Eat a variety of high-fiber foods rather than one type of fiber.   Try to increase your intake of fiber through using high-fiber foods rather than fiber pills or supplements that contain small amounts of fiber.   The goal is to change the types of food eaten. Do not supplement your present diet with high-fiber foods, but replace foods in your present diet.   INCLUDE A VARIETY OF FIBER SOURCES  Replace refined and processed grains with whole grains, canned fruits with fresh fruits, and incorporate other fiber sources. White rice, white breads, and most bakery goods contain little or no fiber.   Brown whole-grain rice, buckwheat oats, and many fruits and vegetables are all good sources of fiber. These include: broccoli, Brussels sprouts, cabbage, cauliflower, beets, sweet potatoes, white potatoes (skin on), carrots, tomatoes, eggplant, squash, berries, fresh fruits, and dried fruits.   Cereals appear to be the richest source of fiber. Cereal fiber is found in whole grains and bran. Bran is the fiber-rich outer coat of cereal grain, which is largely removed in refining. In whole-grain cereals, the bran remains. In breakfast cereals, the largest  amount of fiber is found in those with "bran" in their names. The fiber content is sometimes indicated on the label.   You may need to include additional fruits and vegetables each day.   In baking, for 1 cup white flour, you may use the following substitutions:   1 cup whole-wheat flour minus 2 tablespoons.   1/2 cup white flour plus 1/2 cup whole-wheat flour.   Hemorrhoids Hemorrhoids are dilated (enlarged) veins around the rectum. Sometimes clots will form in the veins. This makes them swollen and painful. These are called thrombosed hemorrhoids. Causes of hemorrhoids include:  Constipation.   Straining to have a bowel movement.   HEAVY LIFTING  HOME CARE INSTRUCTIONS  Eat a well balanced diet and drink 6 to 8 glasses of water every day to avoid constipation. You may also use a bulk laxative.   Avoid straining to have bowel movements.   Keep anal area dry and clean.   Do not use a donut shaped pillow or sit on the toilet for long periods. This increases blood pooling and pain.  Move your bowels when your body has the urge; this will require less straining and will decrease pain and pressure.

## 2017-05-22 NOTE — Op Note (Signed)
[facility] Patient Name: Courtney Rogers Procedure Date: 05/22/2017 9:26 AM MRN: [id number] Date of Birth: [birth year] Attending MD: Barney Drain MD, MD CSN: [id number] Age: 66 Admit Type: Outpatient Procedure:                Colonoscopy, DIAGNOSTIC Indications:              Hematochezia, Anemia Providers:                Barney Drain MD, MD, Charlsie [facility]. Theda Sers RN, RN,                            Aram Candela Referring MD:             Vidal Schwalbe Medicines:                Meperidine 75 mg IV, Midazolam 5 mg IV Complications:            No immediate complications. Estimated Blood Loss:     Estimated blood loss: none. Procedure:                Pre-Anesthesia Assessment:                           - Prior to the procedure, a History and Physical                            was performed, and patient medications and                            allergies were reviewed. The patient's tolerance of                            previous anesthesia was also reviewed. The risks                            and benefits of the procedure and the sedation                            options and risks were discussed with the patient.                            All questions were answered, and informed consent                            was obtained. Prior Anticoagulants: The patient                            last took Coumadin (warfarin) 7 days and Lovenox                            (enoxaparin) 1 day prior to the procedure. ASA                            Grade Assessment: II - A patient with mild systemic  disease. After reviewing the risks and benefits,                            the patient was deemed in satisfactory condition to                            undergo the procedure. After obtaining informed                            consent, the colonoscope was passed under direct                            vision. Throughout the procedure, the patient's         blood pressure, pulse, and oxygen saturations were                            monitored continuously. The [id number] ([id number])                            scope was introduced through the anus and advanced                            to the 5 cm into the ileum. The colonoscopy was                            technically difficult and complex due to                            significant looping and the patient's discomfort                            during the procedure. Successful completion of the                            procedure was aided by increasing the dose of                            sedation medication, changing the patient to a                            supine position, using manual pressure,                            straightening and shortening the scope to obtain                            bowel loop reduction and COLOWRAP. The patient                            tolerated the procedure fairly well. The quality of                            the bowel preparation was excellent. The ileocecal  valve, appendiceal orifice, and rectum were                            photographed. Scope In: 10:15:06 AM Scope Out: 10:37:15 AM Scope Withdrawal Time: 0 hours 15 minutes 57 seconds  Total Procedure Duration: 0 hours 22 minutes 9 seconds  Findings:      The terminal ileum appeared normal.      The recto-sigmoid colon, sigmoid colon and descending colon revealed       significantly excessive looping.      External and internal hemorrhoids were found during retroflexion. The       hemorrhoids were moderate.      Multiple small-mouthed diverticula were found in the recto-sigmoid colon       and sigmoid colon. Impression:               - The examined portion of the ileum was normal.                           - There was significant looping of the colon.                           - External HEMORRHOIDS and RECTAL BLEEDING DUE TO                             internal hemorrhoids.                           - MILD Diverticulosis in the recto-sigmoid colon                            and in the sigmoid colon. Moderate Sedation:      Moderate (conscious) sedation was administered by the endoscopy nurse       and supervised by the endoscopist. The following parameters were       monitored: oxygen saturation, heart rate, blood pressure, and response       to care. Total physician intraservice time was 57 minutes. Recommendation:           - High fiber diet.                           - Continue present medications. RE-START                            LOVENOX.COUMADIN DEC 31.                           - Return to my office in 5 weeks.                           - Patient has a contact number available for                            emergencies. The signs and symptoms of potential                            delayed complications were discussed with  the                            patient. Return to normal activities tomorrow.                            Written discharge instructions were provided to the                            patient.                           - No repeat colonoscopy due to age. Procedure Code(s):        --- Professional ---                           [phone], Colonoscopy, flexible; diagnostic, including                            collection of specimen(s) by brushing or washing,                            when performed (separate procedure)                           99152, Moderate sedation services provided by the                            same physician or other qualified health care                            professional performing the diagnostic or                            therapeutic service that the sedation supports,                            requiring the presence of an independent trained                            observer to assist in the monitoring of the                            patient's level of consciousness and  physiological                            status; initial 15 minutes of intraservice time,                            patient age 73 years or older                           [phone], Moderate sedation services; each additional                            15 minutes  intraservice time                           99153, Moderate sedation services; each additional                            15 minutes intraservice time                           99153, Moderate sedation services; each additional                            15 minutes intraservice time Diagnosis Code(s):        --- Professional ---                           K64.8, Other hemorrhoids                           K92.1, Melena (includes Hematochezia)                           D64.9, Anemia, unspecified                           K57.30, Diverticulosis of large intestine without                            perforation or abscess without bleeding CPT copyright 2016 American Medical Association. All rights reserved. The codes documented in this report are preliminary and upon coder review may  be revised to meet current compliance requirements. Barney Drain, MD Barney Drain MD, MD 05/22/2017 11:25:30 AM This report has been signed electronically. Number of Addenda: 0

## 2017-05-22 NOTE — H&P (Signed)
Primary Care Physician:  Vidal Schwalbe, MD Primary Gastroenterologist:  Dr. Oneida Alar  Pre-Procedure History & Physical: HPI:  Courtney Rogers is a 66 y.o. female here forAnemia/BRBPR/dyspepsia.  Past Medical History:  Diagnosis Date  . Acute myocardial infarction [facility]) 2009   CAD/no stent medically managed  . Anxiety disorder   . CAD (coronary artery disease)   . Hyperlipidemia   . Hypertension   . IBS (irritable bowel syndrome)   . Overactive bladder   . PE (pulmonary embolism) JUN 2014  . Presence of permanent cardiac pacemaker   . Sleep apnea     Past Surgical History:  Procedure Laterality Date  . ABDOMINAL HYSTERECTOMY    . BSO secondary to cyst    . CARDIAC CATHETERIZATION    . COLONOSCOPY  12/2009   Dr. West Carbo, propofol, normal. Next TCS 12/2019  . ESOPHAGOGASTRODUODENOSCOPY  05/11/09   schatzki ring/small hiatal hernia/path:gastritis  . ESOPHAGOGASTRODUODENOSCOPY (EGD) WITH ESOPHAGEAL DILATION N/A 04/01/2013   NOT:[id number] at the gastroesophageal juction/multiple small polyps/mild gastritis  . INSERT / REPLACE / REMOVE PACEMAKER     Last year per pt.(can't remember date)    Prior to Admission medications   Medication Sig Start Date End Date Taking? Authorizing Provider  acetaminophen (TYLENOL) 500 MG tablet Take 1,000 mg by mouth 2 (two) times daily as needed for moderate pain or headache.   Yes [provider]  albuterol (PROVENTIL HFA;VENTOLIN HFA) 108 (90 BASE) MCG/ACT inhaler Inhale 2 puffs into the lungs every 6 (six) hours as needed for shortness of breath.   Yes [provider]  amLODipine (NORVASC) 10 MG tablet Take 10 mg by mouth daily.   Yes [provider]  aspirin EC 81 MG tablet Take 81 mg by mouth at bedtime.   Yes [provider]  B Complex-C (B-COMPLEX WITH VITAMIN C) tablet Take 1 tablet by mouth daily.   Yes [provider]  bismuth subsalicylate (PEPTO BISMOL) 262 MG/15ML suspension Take 30 mLs by  mouth every 6 (six) hours as needed for indigestion or diarrhea or loose stools.   Yes [provider]  busPIRone (BUSPAR) 5 MG tablet Take 5 mg by mouth 2 (two) times daily. 11/25/16  Yes [provider]  Calcium Carbonate-Vitamin D (CALCIUM 600+D PO) Take 1 tablet by mouth 2 (two) times daily.   Yes [provider]  chlorpheniramine (CHLOR-TRIMETON) 4 MG tablet Take 4 mg by mouth daily.   Yes [provider]  chlorthalidone (HYGROTON) 25 MG tablet Take 25 mg by mouth daily.   Yes [provider]  fluticasone (FLONASE) 50 MCG/ACT nasal spray Place 1 spray into both nostrils at bedtime.    Yes [provider]  gabapentin (NEURONTIN) 600 MG tablet Take 600 mg by mouth 2 (two) times daily.    Yes [provider]  lidocaine (XYLOCAINE) 2 % solution TAKE 2 TEASPOONFULS BEFORE MEALS AND AT BEDTIME AS NEEDED MAY REPEAT EVERY 4 HOURS. (MAX OF 8 DOSES PER DAY) Patient taking differently: TAKE 2 TEASPOONFULS BEFORE MEALS AND AT BEDTIME AS NEEDED FOR STOMACH PAIN MAY REPEAT EVERY 4 HOURS. (MAX OF 8 DOSES PER DAY) 04/18/17  Yes Mahala Menghini, PA-C  losartan (COZAAR) 100 MG tablet Take 100 mg by mouth daily.   Yes [provider]  metoprolol succinate (TOPROL-XL) 50 MG 24 hr tablet Take 50 mg by mouth daily.  11/25/16  Yes [provider]  montelukast (SINGULAIR) 10 MG tablet Take 10 mg by mouth daily.  Yes [provider]  Na Sulfate-K Sulfate-Mg Sulf (SUPREP BOWEL PREP KIT) 17.5-3.13-1.6 GM/177ML SOLN Take 1 kit by mouth as directed. 04/19/17  Yes Fields, Sandi L, MD  pantoprazole (PROTONIX) 40 MG tablet 1 po 30 mins prior to first and last meal Patient taking differently: Take 40 mg by mouth daily.  01/12/17  Yes Fields, Sandi L, MD  polyethylene glycol (MIRALAX / GLYCOLAX) packet Take 17 g by mouth daily as needed for mild constipation or moderate constipation.    Yes [provider]  potassium chloride  (K-DUR,KLOR-CON) 10 MEQ tablet Take 20 mEq by mouth 2 (two) times daily.    Yes [provider]  Probiotic Product (PROBIOTIC PO) Take 1 capsule by mouth daily. [facility].   Yes [provider]  sertraline (ZOLOFT) 100 MG tablet Take 200 mg by mouth daily.    Yes [provider]  Simethicone (GAS-X PO) Take 2 tablets by mouth daily as needed (gas).   Yes [provider]  simvastatin (ZOCOR) 40 MG tablet Take 40 mg by mouth at bedtime.     Yes [provider]  tolterodine (DETROL LA) 4 MG 24 hr capsule Take 4 mg by mouth at bedtime.    Yes [provider]  vitamin C (ASCORBIC ACID) 500 MG tablet Take 500 mg by mouth daily.   Yes [provider]  warfarin (COUMADIN) 5 MG tablet Take 2.5-5 mg by mouth See admin instructions. Alternate take 1/2 tab (2.70m) with 1 tab (558m every other day (2.5,5,2.5,5,...)   Yes [provider]  nitroGLYCERIN (NITROSTAT) 0.4 MG SL tablet Place 0.4 mg under the tongue every 5 (five) minutes as needed for chest pain.     [provider]    Allergies as of 04/19/2017 - Review Complete 04/19/2017  Allergen Reaction Noted  . Biaxin [clarithromycin] Other (See Comments)   . Lisinopril Swelling     Family History  Problem Relation Age of Onset  . Colon polyps Neg Hx   . Colon cancer Neg Hx     Social History   Socioeconomic History  . Marital status: Married    Spouse name: Not on file  . Number of children: Not on file  . Years of education: Not on file  . Highest education level: Not on file  Social Needs  . Financial resource strain: Not on file  . Food insecurity - worry: Not on file  . Food insecurity - inability: Not on file  . Transportation needs - medical: Not on file  . Transportation needs - non-medical: Not on file  Occupational History  . Not on file  Tobacco Use  . Smoking status: Never Smoker  . Smokeless tobacco: Never Used  . Tobacco comment:  Never smoked   Substance and Sexual Activity  . Alcohol use: No    Alcohol/week: 0.0 oz  . Drug use: No  . Sexual activity: Not on file  Other Topics Concern  . Not on file  Social History Narrative  . Not on file    Review of Systems: See HPI, otherwise negative ROS   Physical Exam: BP 131/74   Pulse 79   Temp 98.2 F (36.8 C) (Oral)   Resp 14   SpO2 100%  General:   Alert,  pleasant and cooperative in NAD Head:  Normocephalic and atraumatic. Neck:  Supple; Lungs:  Clear throughout to auscultation.    Heart:  Regular rate and rhythm. Abdomen:  Soft, nontender and nondistended. Normal  bowel sounds, without guarding, and without rebound.   Neurologic:  Alert and  oriented x4;  grossly normal neurologically.  Impression/Plan:     Anemia/BRBPR/dyspepsia  PLAN:  1. TCS/EGD TODAY DISCUSSED PROCEDURE, BENEFITS, & RISKS: < 1% chance of medication reaction, bleeding, perforation, or rupture of spleen/liver.

## 2017-05-24 ENCOUNTER — Telehealth: Payer: Self-pay | Admitting: Gastroenterology

## 2017-05-24 MED ORDER — ENOXAPARIN SODIUM 40 MG/0.4ML ~~LOC~~ SOLN: 120.0000 mg | SUBCUTANEOUS | 0 refills | Status: DC

## 2017-05-24 NOTE — Telephone Encounter (Signed)
Pt said she had her last Lovenox on 05/21/2017 and she went back on coumadin on 05/22/2017.  She also said she had a bad night last night. She had a fall about a week before her procedure. They had only done x rays of her knee. But her lower back is bothering her.  I told her she should have it checked out with her PCP. She wants to know if OK to take Tylenol for that pain.

## 2017-05-24 NOTE — Telephone Encounter (Signed)
PT is aware to pick up the Lovenox and take today.  She wants to know when she should have PT/INR checked, her cardiologist is the one that checks it.  Also, she wants to know if OK to take the Tylenol for her back pain.

## 2017-05-24 NOTE — Telephone Encounter (Signed)
PLEASE CALL PT. Ok to take tylenol. Have pt/inr with cardiology on [place] [address].

## 2017-05-24 NOTE — Telephone Encounter (Signed)
PLEASE CALL PT. She only needs one dose of LOVENOX TODAY. Rx SENT.

## 2017-05-24 NOTE — Telephone Encounter (Signed)
Please check with patient. Dr. Laural Golden passed on information that patient called him last night OUT of lovenox.   Just clarify last dose of Lovenox.  Has she been back on coumadin since her procedure?

## 2017-05-24 NOTE — Telephone Encounter (Signed)
Pt is aware to pick up the Lovenox and take today only.

## 2017-05-24 NOTE — Addendum Note (Signed)
Addended by: Danie Binder on: 05/24/2017 09:11 AM   Modules accepted: Orders

## 2017-05-24 NOTE — Telephone Encounter (Signed)
Left message for a return call

## 2017-05-24 NOTE — Telephone Encounter (Signed)
[place], SHE HAS A QUESTION ABOUT HER LOVINOX INJECTION

## 2017-05-24 NOTE — Telephone Encounter (Signed)
Pt is aware to see cardiology Friday to have pt/inr checked. She said she went to PCP today and was given some tramadol for her pain from the fall.  She is aware OK to take the Tylenol when she runs out of the Tramadol.

## 2017-05-25 ENCOUNTER — Encounter (HOSPITAL_COMMUNITY): Payer: Self-pay | Admitting: Gastroenterology

## 2017-05-26 ENCOUNTER — Telehealth: Payer: Self-pay | Admitting: *Deleted

## 2017-05-26 ENCOUNTER — Other Ambulatory Visit: Payer: Self-pay | Admitting: *Deleted

## 2017-05-26 ENCOUNTER — Encounter: Payer: Self-pay | Admitting: *Deleted

## 2017-05-26 DIAGNOSIS — D649 Anemia, unspecified: Secondary | ICD-10-CM

## 2017-05-26 NOTE — Progress Notes (Signed)
PT is aware. Ok to schedule the [employer].

## 2017-05-26 NOTE — Telephone Encounter (Signed)
Spoke with pt and GIVENS scheduled for 06/07/17 at 7:30am, arrival 7:00am at [place]. Instructions have been mailed to patient.  Contacted [employer] [place] to see if PA was needed and was advised pt does not have an active policy with them and coverage expired 05/22/17. Spoke with pt and confirmed she does not have [employer] but still has her medicare A&B plan.

## 2017-06-02 ENCOUNTER — Telehealth: Payer: Self-pay | Admitting: Gastroenterology

## 2017-06-02 DIAGNOSIS — D649 Anemia, unspecified: Secondary | ICD-10-CM

## 2017-06-02 DIAGNOSIS — D539 Nutritional anemia, unspecified: Secondary | ICD-10-CM

## 2017-06-02 NOTE — Telephone Encounter (Signed)
Called patient TO DISCUSS GIVENS PROCEDURE AND TO MAKE HER AWARE THAT INITIALLY GIVENS WOULD BE PLACED AND PT WOULD HAVE TO STAY IN HOSPITAL 8 HRS BUT AS OF 2018 THE ASGE does not recommend pt's be admitted for telemetry when having VCE.  DISCUSSED WITH DR. Janith Lima. WILL FAX THE TELEPHONE CALL FOR HIS REVIEW. WILL DRAW NEEDS CBC JAN 15. PLAN FOR VCE JAN 16. DR. Janith Lima VOICED NO RESERVATION ABOUT STUDY BUT WILL REVIEW CASE AND CALL ME IF ANYTHING CHANGES.  DORIS FAX LAB ORDER TO [facility]. PT AWARE TO HAVE LABS DRAWN ON JAN 15.

## 2017-06-05 NOTE — Telephone Encounter (Signed)
Labs have been released to [facility].  Forwarding to Manuela Schwartz to fax paperwork.

## 2017-06-07 ENCOUNTER — Encounter: Payer: Self-pay | Admitting: Gastroenterology

## 2017-06-07 ENCOUNTER — Encounter (HOSPITAL_COMMUNITY)
Admission: RE | Disposition: A | Discharge status: Home / Self Care | Payer: Self-pay | Source: Ambulatory Visit | Attending: Gastroenterology

## 2017-06-07 ENCOUNTER — Ambulatory Visit (HOSPITAL_COMMUNITY)
Admission: RE | Admit: 2017-06-07 | Discharge: 2017-06-07 | Disposition: A | Discharge status: Home / Self Care | Payer: Medicare Other | Source: Ambulatory Visit | Attending: Gastroenterology | Admitting: Gastroenterology

## 2017-06-07 ENCOUNTER — Encounter (HOSPITAL_COMMUNITY): Payer: Self-pay | Admitting: *Deleted

## 2017-06-07 DIAGNOSIS — K2940 Chronic atrophic gastritis without bleeding: Secondary | ICD-10-CM

## 2017-06-07 DIAGNOSIS — D509 Iron deficiency anemia, unspecified: Secondary | ICD-10-CM | POA: Insufficient documentation

## 2017-06-07 DIAGNOSIS — D649 Anemia, unspecified: Secondary | ICD-10-CM

## 2017-06-07 HISTORY — PX: GIVENS CAPSULE STUDY: SHX5432

## 2017-06-07 LAB — CBC WITH DIFFERENTIAL/PLATELET
Basophils Absolute: 43 cells/uL (ref 0–200)
Basophils Relative: 0.7 %
Eosinophils Absolute: 153 cells/uL (ref 15–500)
Eosinophils Relative: 2.5 %
HCT: 28.4 % — ABNORMAL LOW (ref 35.0–45.0)
Hemoglobin: 9 g/dL — ABNORMAL LOW (ref 11.7–15.5)
Lymphs Abs: 1769 cells/uL (ref 850–3900)
MCH: 25.9 pg — ABNORMAL LOW (ref 27.0–33.0)
MCHC: 31.7 g/dL — ABNORMAL LOW (ref 32.0–36.0)
MCV: 81.8 fL (ref 80.0–100.0)
MPV: 10 fL (ref 7.5–12.5)
Monocytes Relative: 7.4 %
Neutro Abs: 3684 cells/uL (ref 1500–7800)
Neutrophils Relative %: 60.4 %
Platelets: 245 10*3/uL (ref 140–400)
RBC: 3.47 10*6/uL — ABNORMAL LOW (ref 3.80–5.10)
RDW: 16.4 % — ABNORMAL HIGH (ref 11.0–15.0)
Total Lymphocyte: 29 %
WBC mixed population: 451 cells/uL (ref 200–950)
WBC: 6.1 10*3/uL (ref 3.8–10.8)

## 2017-06-07 LAB — VITAMIN B12: Vitamin B-12: 278 pg/mL (ref 200–1100)

## 2017-06-07 LAB — FERRITIN: Ferritin: 13 ng/mL — ABNORMAL LOW (ref 20–288)

## 2017-06-07 LAB — FOLATE RBC: RBC Folate: 762 ng/mL RBC (ref >280)

## 2017-06-07 SURGERY — IMAGING PROCEDURE, GI TRACT, INTRALUMINAL, VIA CAPSULE

## 2017-06-07 MED ORDER — SODIUM CHLORIDE 0.9 % IV SOLN: INTRAVENOUS | Status: DC

## 2017-06-07 NOTE — Telephone Encounter (Signed)
PT is aware. I called [facility] and spoke to [place] and the Folate RBC is pending and final report should be available tomorrow.

## 2017-06-07 NOTE — Progress Notes (Signed)
Dr. Oneida Alar notified that patient has a pacemaker and she said continue procedure as an outpatient.

## 2017-06-07 NOTE — OR Nursing (Signed)
Patient scheduled for a Givens capsule study. Verified with Dr. Oneida Alar that it is ok to proceed with procedure with patient having a pacemaker and not being admitted for telemetry observation. Dr. Oneida Alar discussed procedure with patient's cardiologist and said ok to proceed without patient being monitored. Procedure will be done as scheduled.

## 2017-06-08 ENCOUNTER — Other Ambulatory Visit: Payer: Self-pay

## 2017-06-08 ENCOUNTER — Telehealth: Payer: Self-pay | Admitting: Gastroenterology

## 2017-06-08 ENCOUNTER — Encounter (HOSPITAL_COMMUNITY): Payer: Self-pay | Admitting: Gastroenterology

## 2017-06-08 DIAGNOSIS — D509 Iron deficiency anemia, unspecified: Secondary | ICD-10-CM

## 2017-06-08 NOTE — Telephone Encounter (Signed)
PT is aware and OK to refer to Hematologist.

## 2017-06-08 NOTE — Procedures (Signed)
  PATIENT DATA: 145 LBS, HEIGHT: 61 IN, WAIST: 38  IN, GASTRIC PASSAGE TIME: 6 m, SB PASSAGE TIME: 4H 71m  RESULTS: LIMITED views of gastric mucosa due to retained contents. No blood in the stomach. FREQUENT EROSIONS SEEN IN GASTRIC MUCOSA. NORMAL SMALL BOWEL. No masses,ULCERS, or AVMs SEEN. NO OLD BLOOD OR FRESH BLOOD SEEN IN SMALL BOWEL OR COLON. LIMITED VIEWS OF THE COLON DUE TO RETAINED CONTENTS.  DIAGNOSIS: IRON DEFICIENCY ANEMIA OF UNCERTAIN ETIOLOGY  Plan: 1. IVFE/HEMATOLOGY CONSULT 2. AVOID ASA/NSAIDS FOR 3 MOS. 3. OPV IN 4 MOS

## 2017-06-08 NOTE — Telephone Encounter (Signed)
PLEASE CALL PT. HER GIVENS DID NOT SHOW A SOURCE FOR LOW BLOOD COUNT/IRON DEFICIENCY ANEMIA EXCEPT EROSIVE GASTRITIS, WHICH COULD BE A SOURCE OF BLOOD LOSS WHILE TAKING ASPIRIN. SHE NEEDS ASPIRIN DUE TO HER HISTORY OF HEART DISEASE.   Plan: 1. SEE HEMATOLOGY CONSULT, Dx: IRON DEFICIENCY ANEMIA OF UNCERTAIN ETIOLOGY. CONSIDER IVFE. WE WILL ARRANGE FOR ONE INFUSION AT THE [facility]. 2. OK TO CONTINUE ASPIRIN. AVOID NSAIDS: IBUPROFEN, MOTRIN, ALEVE/NAPROXE, BC OR GOODY POWDERS FOR 3 MOS. 3. OPV IN 4 MOS E30 FEDA/IBS

## 2017-06-08 NOTE — Telephone Encounter (Signed)
Referral sent to hematology via Epic. 

## 2017-06-08 NOTE — Telephone Encounter (Signed)
Pt is aware.  

## 2017-06-09 NOTE — Telephone Encounter (Signed)
cc'ed to pcp °

## 2017-06-09 NOTE — Telephone Encounter (Signed)
Reminder in epic °

## 2017-06-22 ENCOUNTER — Other Ambulatory Visit (HOSPITAL_COMMUNITY): Payer: Self-pay | Admitting: *Deleted

## 2017-06-22 DIAGNOSIS — D649 Anemia, unspecified: Secondary | ICD-10-CM

## 2017-06-23 ENCOUNTER — Inpatient Hospital Stay (HOSPITAL_COMMUNITY): Payer: Medicare Other

## 2017-06-23 ENCOUNTER — Encounter (HOSPITAL_COMMUNITY): Payer: Self-pay | Admitting: Internal Medicine

## 2017-06-23 ENCOUNTER — Inpatient Hospital Stay (HOSPITAL_COMMUNITY): Payer: Medicare Other | Attending: Internal Medicine | Admitting: Internal Medicine

## 2017-06-23 VITALS — BP 139/71 | HR 82 | Temp 98.1°F | Resp 16 | Ht 65.0 in | Wt 191.2 lb

## 2017-06-23 DIAGNOSIS — I2510 Atherosclerotic heart disease of native coronary artery without angina pectoris: Secondary | ICD-10-CM | POA: Insufficient documentation

## 2017-06-23 DIAGNOSIS — G4730 Sleep apnea, unspecified: Secondary | ICD-10-CM | POA: Diagnosis not present

## 2017-06-23 DIAGNOSIS — N3281 Overactive bladder: Secondary | ICD-10-CM | POA: Diagnosis not present

## 2017-06-23 DIAGNOSIS — I252 Old myocardial infarction: Secondary | ICD-10-CM | POA: Insufficient documentation

## 2017-06-23 DIAGNOSIS — D649 Anemia, unspecified: Secondary | ICD-10-CM

## 2017-06-23 DIAGNOSIS — F419 Anxiety disorder, unspecified: Secondary | ICD-10-CM | POA: Diagnosis not present

## 2017-06-23 DIAGNOSIS — Z79899 Other long term (current) drug therapy: Secondary | ICD-10-CM | POA: Diagnosis not present

## 2017-06-23 DIAGNOSIS — D500 Iron deficiency anemia secondary to blood loss (chronic): Secondary | ICD-10-CM

## 2017-06-23 DIAGNOSIS — E876 Hypokalemia: Secondary | ICD-10-CM | POA: Insufficient documentation

## 2017-06-23 DIAGNOSIS — I10 Essential (primary) hypertension: Secondary | ICD-10-CM | POA: Insufficient documentation

## 2017-06-23 DIAGNOSIS — Z950 Presence of cardiac pacemaker: Secondary | ICD-10-CM | POA: Insufficient documentation

## 2017-06-23 DIAGNOSIS — Z7982 Long term (current) use of aspirin: Secondary | ICD-10-CM | POA: Diagnosis not present

## 2017-06-23 DIAGNOSIS — K589 Irritable bowel syndrome without diarrhea: Secondary | ICD-10-CM | POA: Diagnosis not present

## 2017-06-23 DIAGNOSIS — E785 Hyperlipidemia, unspecified: Secondary | ICD-10-CM | POA: Diagnosis not present

## 2017-06-23 DIAGNOSIS — D509 Iron deficiency anemia, unspecified: Secondary | ICD-10-CM | POA: Insufficient documentation

## 2017-06-23 DIAGNOSIS — Z86711 Personal history of pulmonary embolism: Secondary | ICD-10-CM

## 2017-06-23 DIAGNOSIS — Z7901 Long term (current) use of anticoagulants: Secondary | ICD-10-CM | POA: Insufficient documentation

## 2017-06-23 LAB — COMPREHENSIVE METABOLIC PANEL
ALT: 17 U/L (ref 14–54)
AST: 30 U/L (ref 15–41)
Albumin: 3.6 g/dL (ref 3.5–5.0)
Alkaline Phosphatase: 64 U/L (ref 38–126)
Anion gap: 13 (ref 5–15)
BUN: 16 mg/dL (ref 6–20)
CO2: 26 mmol/L (ref 22–32)
Calcium: 9 mg/dL (ref 8.9–10.3)
Chloride: 104 mmol/L (ref 101–111)
Creatinine, Ser: 0.84 mg/dL (ref 0.44–1.00)
GFR calc Af Amer: >60 mL/min (ref >60)
GFR calc non Af Amer: >60 mL/min (ref >60)
Glucose, Bld: 110 mg/dL — ABNORMAL HIGH (ref 65–99)
Potassium: 3.1 mmol/L — ABNORMAL LOW (ref 3.5–5.1)
Sodium: 143 mmol/L (ref 135–145)
Total Bilirubin: 0.5 mg/dL (ref 0.3–1.2)
Total Protein: 9.2 g/dL — ABNORMAL HIGH (ref 6.5–8.1)

## 2017-06-23 LAB — CBC WITH DIFFERENTIAL/PLATELET
Basophils Absolute: 0 10*3/uL (ref 0.0–0.1)
Basophils Relative: 0 %
Eosinophils Absolute: 0.3 10*3/uL (ref 0.0–0.7)
Eosinophils Relative: 4 %
HCT: 28.1 % — ABNORMAL LOW (ref 36.0–46.0)
Hemoglobin: 8.4 g/dL — ABNORMAL LOW (ref 12.0–15.0)
Lymphocytes Relative: 27 %
Lymphs Abs: 1.8 10*3/uL (ref 0.7–4.0)
MCH: 25.3 pg — ABNORMAL LOW (ref 26.0–34.0)
MCHC: 29.9 g/dL — ABNORMAL LOW (ref 30.0–36.0)
MCV: 84.6 fL (ref 78.0–100.0)
Monocytes Absolute: 0.4 10*3/uL (ref 0.1–1.0)
Monocytes Relative: 6 %
Neutro Abs: 4.4 10*3/uL (ref 1.7–7.7)
Neutrophils Relative %: 63 %
Platelets: 172 10*3/uL (ref 150–400)
RBC: 3.32 MIL/uL — ABNORMAL LOW (ref 3.87–5.11)
RDW: 17.2 % — ABNORMAL HIGH (ref 11.5–15.5)
WBC: 6.9 10*3/uL (ref 4.0–10.5)

## 2017-06-23 LAB — IRON AND TIBC
Iron: 20 ug/dL — ABNORMAL LOW (ref 28–170)
Saturation Ratios: 4 % — ABNORMAL LOW (ref 10.4–31.8)
TIBC: 468 ug/dL — ABNORMAL HIGH (ref 250–450)
UIBC: 448 ug/dL

## 2017-06-23 LAB — RETICULOCYTES
RBC.: 3.32 MIL/uL — ABNORMAL LOW (ref 3.87–5.11)
Retic Count, Absolute: 39.8 10*3/uL (ref 19.0–186.0)
Retic Ct Pct: 1.2 % (ref 0.4–3.1)

## 2017-06-23 LAB — FERRITIN: Ferritin: 7 ng/mL — ABNORMAL LOW (ref 11–307)

## 2017-06-23 MED ORDER — FERROUS SULFATE 325 (65 FE) MG PO TBEC: 325.0000 mg | DELAYED_RELEASE_TABLET | Freq: Three times a day (TID) | ORAL | 3 refills | Status: DC

## 2017-06-23 NOTE — Progress Notes (Signed)
[facility] CONSULTATION NOTE 07/08/17  REFERRING PROVIDER: Vidal Schwalbe, MD [address] [place], [place] [address]  VISIT PROVIDER: Creola Corn, MD   CHIEF COMPLAINT: Referral requested for anemia- chronic progressive since atleast 11/2016.  HISTORY OF PRESENT ILLNESS: This is a pleasant 67 year old lady who is referred by Dr. Barney Drain for lower Gi bleed noted in 2018. Patient also has a long standing history of dyspepsia. She was found to have a decline in hgb gradually since 11/2016 from about 10 to 9 recently. Ferritin was noted to be low confirming Iron deficiency. Patient underwent extensive GI work up to identify source of bleed, without any significant source  Review of systems, she denies any loss of weight, c/o  intermittent constipation, no recent hematochezia no melena.  No current dyspeptic symptoms.   History of arthritis more on the right knee than left. She has a history of irritable bowel syndrome that causes intermittent constipation. H/o PE on coumadin and H/o CAD s/p MI. All other system review is negative Past Medical History:  Diagnosis Date  . Acute myocardial infarction [facility]) 2009   CAD/no stent medically managed  . Anxiety disorder   . CAD (coronary artery disease)   . Hyperlipidemia   . Hypertension   . IBS (irritable bowel syndrome)   . Overactive bladder   . PE (pulmonary embolism) JUN 2014  . Presence of permanent cardiac pacemaker   . Sleep apnea    Social History   Tobacco Use  . Smoking status: Never Smoker  . Smokeless tobacco: Never Used  . Tobacco comment: Never smoked   Substance Use Topics  . Alcohol use: No    Alcohol/week: 0.0 oz     Family history significant for mother who had?  Bone cancer in her 76s and a grandmother who had leukemia.  Medications have been reviewed and noted. Current Meds  Medication Sig  . acetaminophen (TYLENOL) 500 MG tablet Take 1,000 mg by mouth 2 (two) times  daily as needed for moderate pain or headache.  . albuterol (PROVENTIL HFA;VENTOLIN HFA) 108 (90 BASE) MCG/ACT inhaler Inhale 2 puffs into the lungs every 6 (six) hours as needed for shortness of breath.  Marland Kitchen amLODipine (NORVASC) 10 MG tablet Take 10 mg by mouth daily.  Marland Kitchen aspirin EC 81 MG tablet Take 81 mg by mouth at bedtime.  . B Complex-C (B-COMPLEX WITH VITAMIN C) tablet Take 1 tablet by mouth daily.  Marland Kitchen bismuth subsalicylate (PEPTO BISMOL) 262 MG/15ML suspension Take 30 mLs by mouth every 6 (six) hours as needed for indigestion or diarrhea or loose stools.  . busPIRone (BUSPAR) 5 MG tablet Take 5 mg by mouth 2 (two) times daily.  . Calcium Carbonate-Vitamin D (CALCIUM 600+D PO) Take 1 tablet by mouth 2 (two) times daily.  . chlorthalidone (HYGROTON) 25 MG tablet Take 25 mg by mouth daily.  Marland Kitchen enoxaparin (LOVENOX) 40 MG/0.4ML injection Inject 1.2 mLs (120 mg total) into the skin daily.  . fluticasone (FLONASE) 50 MCG/ACT nasal spray Place 1 spray into both nostrils at bedtime.   . gabapentin (NEURONTIN) 600 MG tablet Take 600 mg by mouth 2 (two) times daily.   Marland Kitchen lidocaine (XYLOCAINE) 2 % solution TAKE 2 TEASPOONFULS BEFORE MEALS AND AT BEDTIME AS NEEDED MAY REPEAT EVERY 4 HOURS. (MAX OF 8 DOSES PER DAY) (Patient taking differently: TAKE 2 TEASPOONFULS BEFORE MEALS AND AT BEDTIME AS NEEDED FOR STOMACH PAIN MAY REPEAT EVERY 4 HOURS. (MAX OF 8  DOSES PER DAY))  . losartan (COZAAR) 100 MG tablet Take 100 mg by mouth daily.  . metoprolol succinate (TOPROL-XL) 50 MG 24 hr tablet Take 50 mg by mouth daily.   . montelukast (SINGULAIR) 10 MG tablet Take 10 mg by mouth daily.   . nitroGLYCERIN (NITROSTAT) 0.4 MG SL tablet Place 0.4 mg under the tongue every 5 (five) minutes as needed for chest pain.   . pantoprazole (PROTONIX) 40 MG tablet 1 po 30 mins prior to first and last meal (Patient taking differently: Take 40 mg by mouth daily. )  . polyethylene glycol (MIRALAX / GLYCOLAX) packet Take 17 g by mouth  daily as needed for mild constipation or moderate constipation.   . potassium chloride (K-DUR,KLOR-CON) 10 MEQ tablet Take 20 mEq by mouth 2 (two) times daily.   . Probiotic Product (PROBIOTIC PO) Take 1 capsule by mouth daily. [facility].  . sertraline (ZOLOFT) 100 MG tablet Take 200 mg by mouth daily.   . Simethicone (GAS-X PO) Take 2 tablets by mouth daily as needed (gas).  . simvastatin (ZOCOR) 40 MG tablet Take 40 mg by mouth at bedtime.    . tolterodine (DETROL LA) 4 MG 24 hr capsule Take 4 mg by mouth at bedtime.   . vitamin C (ASCORBIC ACID) 500 MG tablet Take 500 mg by mouth daily.  Marland Kitchen warfarin (COUMADIN) 5 MG tablet Take 2.5-5 mg by mouth See admin instructions. Alternate take 1/2 tab (2.5mg ) with 1 tab (5mg ) every other day (2.5,5,2.5,5,...)   Lab review: Hemoglobin dropped from 10.1 from September to 8.4 this morning.  Platelets are normal white cell count is normal.  Ferritin was 12 on 06/06/2017  Iron panel is not available. B12 was borderline,  methylmalonic acid levels and repeat iron panel are pending. EGD and and colonoscopy from May 22, 2017 clear. Capsule enteroscopy.  Was apparently done last week reports are not available to me.  Physical Exam  Constitutional: She is oriented to person, place, and time and well-developed, well-nourished, and in no distress.  HENT:  Head: Normocephalic and atraumatic.  Mouth/Throat: Oropharynx is clear and moist. No oropharyngeal exudate.  Eyes: EOM are normal. Pupils are equal, round, and reactive to light.  Neck: Normal range of motion. Neck supple. No tracheal deviation present.  Cardiovascular: Normal rate and regular rhythm.  Pulmonary/Chest: Effort normal. No respiratory distress. She exhibits no tenderness.  Abdominal: Soft. Bowel sounds are normal. She exhibits mass. She exhibits no distension. There is no tenderness.  Musculoskeletal: She exhibits no edema or deformity.  Limited ROM of both knees   Lymphadenopathy:    She has no cervical adenopathy.  Neurological: She is alert and oriented to person, place, and time. No cranial nerve deficit. Gait normal. Coordination normal.  Skin: Skin is warm. No rash noted. No erythema. There is pallor.  Psychiatric: Mood, memory, affect and judgment normal.   Assessment and Plan:  1. Chronic Iron deficiency anemia- Hemoglobin dropped from 10.1 from September to 9 in 05/2016 to  8.4 this morning. No visible external bleeding, her baseline from chart is at 11.9 in 03/2013, no labs in chart between 2014 and 11/2016 when hgb was 10.1. She feels tired but functional, no chest pain.   Platelets are normal white cell count is normal.   Ferritin was 12 on 06/06/2017 - source of blood loss not clear. EGD and colonoscopy were negative except for erosive gastritis. Patient is awaiting report on capsule enteroscopy.Keep f/u with GI -She had not  tried oral Iron before, therefore, will start p.o. iron sulfate 325 mg 3 times a day.   -She knows to contact us if she does not tolerate oral iron.   -We will contact her with results of methylmalonic acid levels when available. -Follow-up in 2 months if no improvement consider additional evaluation/ AND IV iron.  2. Hypokalemia- advised to increase potassium in diet. She is currently on potassium 10 mEq twice daily   -I recommended to increase the dose and follow-up with primary care.  3. H/O PE- on Coumadin - follows with PCP

## 2017-06-23 NOTE — Patient Instructions (Signed)
[facility] at [facility] Discharge Instructions  RECOMMENDATIONS MADE BY THE CONSULTANT AND ANY TEST RESULTS WILL BE SENT TO YOUR REFERRING PHYSICIAN.  You saw Dr. Bangladesh today.  Thank you for choosing [place] at [facility] to provide your oncology and hematology care.  To afford each patient quality time with our provider, please arrive at least 15 minutes before your scheduled appointment time.    If you have a lab appointment with the [facility] please come in thru the  Main Entrance and check in at the main information desk  You need to re-schedule your appointment should you arrive 10 or more minutes late.  We strive to give you quality time with our providers, and arriving late affects you and other patients whose appointments are after yours.  Also, if you no show three or more times for appointments you may be dismissed from the clinic at the providers discretion.     Again, thank you for choosing [facility].  Our hope is that these requests will decrease the amount of time that you wait before being seen by our physicians.       _____________________________________________________________  Should you have questions after your visit to [facility], please contact our office at [phone] between the hours of 8:30 a.m. and 4:30 p.m.  Voicemails left after 4:30 p.m. will not be returned until the following business day.  For prescription refill requests, have your pharmacy contact our office.       Resources For Cancer Patients and their Caregivers ? American Cancer Society: Can assist with transportation, wigs, general needs, runs Look Good Feel Better.        575-637-3822 ? Cancer Care: Provides financial assistance, online support groups, medication/co-pay assistance.  1-800-813-HOPE 812-731-9249) ? [facility] Assists [place] cancer patients and their families through  emotional , educational and financial support.  (682)389-4182 ? [place] DSS Where to apply for food stamps, Medicaid and utility assistance. 819-077-4027 ? [employer]: Transportation to medical appointments. 774-649-7246 ? Social Security Administration: May apply for disability if have a Stage IV cancer. 810-356-6858 864-433-6057 ? [employer], Disability and Transit Services: Assists with nutrition, care and transit needs. [place] Support Programs: @10RELATIVEDAYS @ > Cancer Support Group  2nd Tuesday of the month 1pm-2pm, Journey Room  > Creative Journey  3rd Tuesday of the month 1130am-1pm, Journey Room  > Look Good Feel Better  1st Wednesday of the month 10am-12 noon, Journey Room (Call [facility] to register [phone])

## 2017-06-27 LAB — METHYLMALONIC ACID, SERUM: Methylmalonic Acid, Quantitative: 135 nmol/L (ref 0–378)

## 2017-07-25 ENCOUNTER — Telehealth (HOSPITAL_COMMUNITY): Payer: Self-pay

## 2017-07-25 DIAGNOSIS — D649 Anemia, unspecified: Secondary | ICD-10-CM

## 2017-07-25 NOTE — Telephone Encounter (Signed)
Patient called stating she was having abdominal pain when she takes the Iron. She already has constipation but has not noticed it being any worse. Patient wants to know if there is anything else she can take instead of the Iron. Instructed patient to stop the Iron until someone from the cancer center calls her back. Patient verbalized understanding.

## 2017-07-28 ENCOUNTER — Other Ambulatory Visit (HOSPITAL_COMMUNITY): Payer: Self-pay

## 2017-07-28 NOTE — Addendum Note (Signed)
Addended by: Darlina Guys on: 07/28/2017 03:39 PM   Modules accepted: Orders

## 2017-07-28 NOTE — Progress Notes (Signed)
This encounter was created in error - please disregard.

## 2017-07-28 NOTE — Telephone Encounter (Signed)
After reviewing with Dr. Walden [place], I called patient back to let her know to stay off the Iron pill. If her abdominal pain gets worse to go to ER. She verbalized understanding. I reviewed the symptoms of anemia with patient, ie: fatigue, SOB, dizziness. Patient states she is very fatigued and "dragging". Labs ordered for next Wednesday ( patients day of preference). Explained to patient once we got the lab results back, someone would call her with results and next steps. She verbalized understanding.

## 2017-08-02 ENCOUNTER — Inpatient Hospital Stay (HOSPITAL_COMMUNITY): Payer: Medicare Other | Attending: Internal Medicine

## 2017-08-02 DIAGNOSIS — Z79899 Other long term (current) drug therapy: Secondary | ICD-10-CM | POA: Insufficient documentation

## 2017-08-02 DIAGNOSIS — Z7901 Long term (current) use of anticoagulants: Secondary | ICD-10-CM | POA: Insufficient documentation

## 2017-08-02 DIAGNOSIS — E876 Hypokalemia: Secondary | ICD-10-CM | POA: Insufficient documentation

## 2017-08-02 DIAGNOSIS — Z86711 Personal history of pulmonary embolism: Secondary | ICD-10-CM | POA: Insufficient documentation

## 2017-08-02 DIAGNOSIS — D509 Iron deficiency anemia, unspecified: Secondary | ICD-10-CM | POA: Diagnosis present

## 2017-08-02 DIAGNOSIS — D649 Anemia, unspecified: Secondary | ICD-10-CM

## 2017-08-02 LAB — CBC WITH DIFFERENTIAL/PLATELET
Basophils Absolute: 0 10*3/uL (ref 0.0–0.1)
Basophils Relative: 1 %
Eosinophils Absolute: 0.2 10*3/uL (ref 0.0–0.7)
Eosinophils Relative: 4 %
HCT: 34.7 % — ABNORMAL LOW (ref 36.0–46.0)
Hemoglobin: 10.4 g/dL — ABNORMAL LOW (ref 12.0–15.0)
Lymphocytes Relative: 37 %
Lymphs Abs: 2.2 10*3/uL (ref 0.7–4.0)
MCH: 26.8 pg (ref 26.0–34.0)
MCHC: 30 g/dL (ref 30.0–36.0)
MCV: 89.4 fL (ref 78.0–100.0)
Monocytes Absolute: 0.5 10*3/uL (ref 0.1–1.0)
Monocytes Relative: 9 %
Neutro Abs: 2.9 10*3/uL (ref 1.7–7.7)
Neutrophils Relative %: 49 %
Platelets: 171 10*3/uL (ref 150–400)
RBC: 3.88 MIL/uL (ref 3.87–5.11)
RDW: 20.7 % — ABNORMAL HIGH (ref 11.5–15.5)
WBC: 5.8 10*3/uL (ref 4.0–10.5)

## 2017-08-02 LAB — IRON AND TIBC
Iron: 37 ug/dL (ref 28–170)
Saturation Ratios: 9 % — ABNORMAL LOW (ref 10.4–31.8)
TIBC: 416 ug/dL (ref 250–450)
UIBC: 379 ug/dL

## 2017-08-02 LAB — FERRITIN: Ferritin: 17 ng/mL (ref 11–307)

## 2017-08-03 ENCOUNTER — Other Ambulatory Visit (HOSPITAL_COMMUNITY): Payer: Self-pay | Admitting: Adult Health

## 2017-08-03 DIAGNOSIS — D509 Iron deficiency anemia, unspecified: Secondary | ICD-10-CM

## 2017-08-07 ENCOUNTER — Encounter (HOSPITAL_COMMUNITY): Payer: Self-pay

## 2017-08-07 ENCOUNTER — Other Ambulatory Visit: Payer: Self-pay

## 2017-08-07 ENCOUNTER — Inpatient Hospital Stay (HOSPITAL_COMMUNITY): Payer: Medicare Other

## 2017-08-07 VITALS — BP 143/68 | HR 62 | Temp 97.8°F | Resp 18 | Wt 188.2 lb

## 2017-08-07 DIAGNOSIS — D509 Iron deficiency anemia, unspecified: Secondary | ICD-10-CM

## 2017-08-07 MED ORDER — SODIUM CHLORIDE 0.9 % IV SOLN
INTRAVENOUS | Status: DC
  Administered 2017-08-07: 13:00:00 via INTRAVENOUS

## 2017-08-07 MED ORDER — FERRIC CARBOXYMALTOSE 750 MG/15ML IV SOLN
750.0000 mg | Freq: Once | INTRAVENOUS | Status: AC
  Administered 2017-08-07: 750 mg via INTRAVENOUS
  Filled 2017-08-07: qty 15

## 2017-08-07 NOTE — Progress Notes (Signed)
Injectafer given today per orders. Vitals stable and discharged home from clinic ambulatory self. Follow up as scheduled.

## 2017-08-07 NOTE — Patient Instructions (Signed)
[name] at [facility] Discharge Instructions Injectafer given Follow up as scheduled.  Thank you for choosing  [facility] at [facility] to provide your oncology and hematology care.  To afford each patient quality time with our provider, please arrive at least 15 minutes before your scheduled appointment time.   If you have a lab appointment with the Cancer Center please come in thru the  Main Entrance and check in at the main information desk  You need to re-schedule your appointment should you arrive 10 or more minutes late.  We strive to give you quality time with our providers, and arriving late affects you and other patients whose appointments are after yours.  Also, if you no show three or more times for appointments you may be dismissed from the clinic at the providers discretion.     Again, thank you for choosing [facility].  Our hope is that these requests will decrease the amount of time that you wait before being seen by our physicians.       _____________________________________________________________  Should you have questions after your visit to [facility], please contact our office at [phone] between the hours of 8:30 a.m. and 4:30 p.m.  Voicemails left after 4:30 p.m. will not be returned until the following business day.  For prescription refill requests, have your pharmacy contact our office.       Resources For Cancer Patients and their Caregivers ? American Cancer Society: Can assist with transportation, wigs, general needs, runs Look Good Feel Better.        1-888-227-6333 ? Cancer Care: Provides financial assistance, online support groups, medication/co-pay assistance.  1-800-813-HOPE (4673) ? [facility] Assists [place] cancer patients and their families through emotional , educational and financial support.  336-427-4357 ? [place] DSS Where to  apply for food stamps, Medicaid and utility assistance. 336-342-1394 ? [employer]: Transportation to medical appointments. 336-347-2287 ? Social Security Administration: May apply for disability if have a Stage IV cancer. 336-342-7796 1-800-772-1213 ? [place] [employer], Disability and Transit Services: Assists with nutrition, care and transit needs. 336-349-2343  Cancer Center Support Programs:   > Cancer Support Group  2nd Tuesday of the month 1pm-2pm, Journey Room   > Creative Journey  3rd Tuesday of the month 1130am-1pm, Journey Room    

## 2017-08-16 ENCOUNTER — Encounter: Payer: Self-pay | Admitting: Gastroenterology

## 2017-08-18 ENCOUNTER — Other Ambulatory Visit (HOSPITAL_COMMUNITY): Payer: Self-pay | Admitting: *Deleted

## 2017-08-18 DIAGNOSIS — D509 Iron deficiency anemia, unspecified: Secondary | ICD-10-CM

## 2017-08-21 ENCOUNTER — Other Ambulatory Visit: Payer: Self-pay

## 2017-08-21 ENCOUNTER — Other Ambulatory Visit (HOSPITAL_COMMUNITY): Payer: Medicare Other

## 2017-08-21 ENCOUNTER — Ambulatory Visit (HOSPITAL_COMMUNITY): Payer: Medicare Other | Admitting: Hematology

## 2017-08-21 ENCOUNTER — Inpatient Hospital Stay (HOSPITAL_COMMUNITY): Payer: Medicare Other | Attending: Internal Medicine

## 2017-08-21 ENCOUNTER — Inpatient Hospital Stay (HOSPITAL_BASED_OUTPATIENT_CLINIC_OR_DEPARTMENT_OTHER): Payer: Medicare Other | Admitting: Internal Medicine

## 2017-08-21 VITALS — BP 133/77 | HR 88 | Temp 97.9°F | Resp 16 | Ht 65.0 in | Wt 190.6 lb

## 2017-08-21 DIAGNOSIS — Z950 Presence of cardiac pacemaker: Secondary | ICD-10-CM | POA: Insufficient documentation

## 2017-08-21 DIAGNOSIS — E785 Hyperlipidemia, unspecified: Secondary | ICD-10-CM | POA: Diagnosis not present

## 2017-08-21 DIAGNOSIS — K219 Gastro-esophageal reflux disease without esophagitis: Secondary | ICD-10-CM | POA: Insufficient documentation

## 2017-08-21 DIAGNOSIS — D509 Iron deficiency anemia, unspecified: Secondary | ICD-10-CM | POA: Diagnosis not present

## 2017-08-21 DIAGNOSIS — Z79899 Other long term (current) drug therapy: Secondary | ICD-10-CM

## 2017-08-21 DIAGNOSIS — K589 Irritable bowel syndrome without diarrhea: Secondary | ICD-10-CM | POA: Diagnosis not present

## 2017-08-21 DIAGNOSIS — Z7901 Long term (current) use of anticoagulants: Secondary | ICD-10-CM | POA: Diagnosis not present

## 2017-08-21 DIAGNOSIS — Z86711 Personal history of pulmonary embolism: Secondary | ICD-10-CM

## 2017-08-21 DIAGNOSIS — I10 Essential (primary) hypertension: Secondary | ICD-10-CM | POA: Diagnosis not present

## 2017-08-21 DIAGNOSIS — E876 Hypokalemia: Secondary | ICD-10-CM | POA: Insufficient documentation

## 2017-08-21 DIAGNOSIS — K449 Diaphragmatic hernia without obstruction or gangrene: Secondary | ICD-10-CM | POA: Diagnosis not present

## 2017-08-21 DIAGNOSIS — Z7982 Long term (current) use of aspirin: Secondary | ICD-10-CM | POA: Insufficient documentation

## 2017-08-21 DIAGNOSIS — I252 Old myocardial infarction: Secondary | ICD-10-CM | POA: Insufficient documentation

## 2017-08-21 DIAGNOSIS — N3281 Overactive bladder: Secondary | ICD-10-CM | POA: Diagnosis not present

## 2017-08-21 DIAGNOSIS — I2510 Atherosclerotic heart disease of native coronary artery without angina pectoris: Secondary | ICD-10-CM | POA: Diagnosis not present

## 2017-08-21 DIAGNOSIS — D500 Iron deficiency anemia secondary to blood loss (chronic): Secondary | ICD-10-CM

## 2017-08-21 DIAGNOSIS — G4730 Sleep apnea, unspecified: Secondary | ICD-10-CM | POA: Insufficient documentation

## 2017-08-21 LAB — COMPREHENSIVE METABOLIC PANEL
ALT: 21 U/L (ref 14–54)
AST: 31 U/L (ref 15–41)
Albumin: 3.9 g/dL (ref 3.5–5.0)
Alkaline Phosphatase: 81 U/L (ref 38–126)
Anion gap: 11 (ref 5–15)
BUN: 12 mg/dL (ref 6–20)
CO2: 24 mmol/L (ref 22–32)
Calcium: 9.1 mg/dL (ref 8.9–10.3)
Chloride: 102 mmol/L (ref 101–111)
Creatinine, Ser: 0.76 mg/dL (ref 0.44–1.00)
GFR calc Af Amer: >60 mL/min (ref >60)
GFR calc non Af Amer: >60 mL/min (ref >60)
Glucose, Bld: 95 mg/dL (ref 65–99)
Potassium: 3.8 mmol/L (ref 3.5–5.1)
Sodium: 137 mmol/L (ref 135–145)
Total Bilirubin: 0.7 mg/dL (ref 0.3–1.2)
Total Protein: 8.9 g/dL — ABNORMAL HIGH (ref 6.5–8.1)

## 2017-08-21 LAB — CBC WITH DIFFERENTIAL/PLATELET
Basophils Absolute: 0.1 10*3/uL (ref 0.0–0.1)
Basophils Relative: 1 %
Eosinophils Absolute: 0.2 10*3/uL (ref 0.0–0.7)
Eosinophils Relative: 3 %
HCT: 36.2 % (ref 36.0–46.0)
Hemoglobin: 11.4 g/dL — ABNORMAL LOW (ref 12.0–15.0)
Lymphocytes Relative: 27 %
Lymphs Abs: 2 10*3/uL (ref 0.7–4.0)
MCH: 28.1 pg (ref 26.0–34.0)
MCHC: 31.5 g/dL (ref 30.0–36.0)
MCV: 89.4 fL (ref 78.0–100.0)
Monocytes Absolute: 0.8 10*3/uL (ref 0.1–1.0)
Monocytes Relative: 10 %
Neutro Abs: 4.4 10*3/uL (ref 1.7–7.7)
Neutrophils Relative %: 59 %
Platelets: 155 10*3/uL (ref 150–400)
RBC: 4.05 MIL/uL (ref 3.87–5.11)
RDW: 21.2 % — ABNORMAL HIGH (ref 11.5–15.5)
WBC: 7.5 10*3/uL (ref 4.0–10.5)

## 2017-08-21 LAB — IRON AND TIBC
Iron: 76 ug/dL (ref 28–170)
Saturation Ratios: 23 % (ref 10.4–31.8)
TIBC: 329 ug/dL (ref 250–450)
UIBC: 253 ug/dL

## 2017-08-21 LAB — FERRITIN: Ferritin: 473 ng/mL — ABNORMAL HIGH (ref 11–307)

## 2017-08-21 NOTE — Progress Notes (Signed)
Diagnosis Iron deficiency anemia due to chronic blood loss - Plan: CBC with Differential/Platelet, Comprehensive metabolic panel, Lactate dehydrogenase, Ferritin  Staging Cancer Staging No matching staging information was found for the patient.  Assessment and Plan: 1.  Iron deficiency anemia.  Patient was initially seen by Dr. Sherrine Maples.  She noted pt's Hemoglobin dropped from 10.1 to  8.4 on labs done 06/23/2017.   She has undergone GI evaluation by  Dr. Oneida Alar.  She reports that was treated with IV iron.  Review of chart indicates she received Injectafer for 1 dose on 08/07/2017.   Hb  improved at 11.4.  Awaiting results of her ferritin.  If ferritin remains decreased she will be given option of day 8 of Injectafer.  Continue GI evaluation as recommended.    2.  Fatigue.  I discussed with her hemoglobin is improved at 11.4.  She should notify the office if symptoms worsen and may consider cardiology referral.    3.  PE.  Patient is on Coumadin.  Follow-up with PCP for monitoring.  4.  Hypokalemia.  Potassium is improved at 3.8.  He is on potassium supplementation and follow-up with PCP for ongoing monitoring.  5.  Health maintenance.  Continue GI and mammogram evaluation is recommended.  Interval History:   67 year old lady who is referred by Dr. Barney Drain for lower Gi bleed noted in 2018. Patient also has a long standing history of dyspepsia. She was found to have a decline in hgb gradually since 11/2016 from about 10 to 9 recently. Ferritin was noted to be low confirming Iron deficiency. Patient underwent extensive GI work up to identify source of bleed, without any significant source   Current Status: She is seen today for follow-up.  She is here for evaluation after IV iron.  She reports fatigue.  Problem List Patient Active Problem List   Diagnosis Date Noted  . Iron deficiency anemia [D50.9] 08/03/2017  . Dyspepsia [R10.13]   . Coccydynia [M53.3] 04/19/2017  . Normocytic anemia  [D64.9] 01/12/2017  . Fatty liver [K76.0] 11/03/2015  . Anxiety state [F41.1] 11/03/2015  . Weight loss [R63.4] 04/05/2011  . GERD [K21.9] 06/01/2010  . IRRITABLE BOWEL SYNDROME [K58.9] 07/21/2009  . Non-ulcer dyspepsia [K30] 04/30/2009    Past Medical History Past Medical History:  Diagnosis Date  . Acute myocardial infarction [facility]) 2009   CAD/no stent medically managed  . Anxiety disorder   . CAD (coronary artery disease)   . Hyperlipidemia   . Hypertension   . IBS (irritable bowel syndrome)   . Overactive bladder   . PE (pulmonary embolism) JUN 2014  . Presence of permanent cardiac pacemaker   . Sleep apnea     Past Surgical History Past Surgical History:  Procedure Laterality Date  . ABDOMINAL HYSTERECTOMY    . BSO secondary to cyst    . CARDIAC CATHETERIZATION    . COLONOSCOPY  12/2009   Dr. West Carbo, propofol, normal. Next TCS 12/2019  . COLONOSCOPY N/A 05/22/2017   Procedure: COLONOSCOPY;  Surgeon: Danie Binder, MD;  Location: [facility];  Service: Endoscopy;  Laterality: N/A;  10:30am  . ESOPHAGOGASTRODUODENOSCOPY  05/11/09   schatzki ring/small hiatal hernia/path:gastritis  . ESOPHAGOGASTRODUODENOSCOPY N/A 05/22/2017   Procedure: ESOPHAGOGASTRODUODENOSCOPY (EGD);  Surgeon: Danie Binder, MD;  Location: [facility];  Service: Endoscopy;  Laterality: N/A;  . ESOPHAGOGASTRODUODENOSCOPY (EGD) WITH ESOPHAGEAL DILATION N/A 04/01/2013   MBT:DHRCBULAG at the gastroesophageal juction/multiple small polyps/mild gastritis  . GIVENS CAPSULE STUDY N/A 06/07/2017  Procedure: GIVENS CAPSULE STUDY;  Surgeon: Danie Binder, MD;  Location: [facility];  Service: Endoscopy;  Laterality: N/A;  7:30am  . INSERT / REPLACE / REMOVE PACEMAKER     Last year per pt.(can't remember date)    Family History Family History  Problem Relation Age of Onset  . Colon polyps Neg Hx   . Colon cancer Neg Hx      Social History  reports that she has never smoked. She has never  used smokeless tobacco. She reports that she does not drink alcohol or use drugs.  Medications  Current Outpatient Medications:  .  acetaminophen (TYLENOL) 500 MG tablet, Take 1,000 mg by mouth 2 (two) times daily as needed for moderate pain or headache., Disp: , Rfl:  .  albuterol (PROVENTIL HFA;VENTOLIN HFA) 108 (90 BASE) MCG/ACT inhaler, Inhale 2 puffs into the lungs every 6 (six) hours as needed for shortness of breath., Disp: , Rfl:  .  amLODipine (NORVASC) 10 MG tablet, Take 10 mg by mouth daily., Disp: , Rfl:  .  aspirin EC 81 MG tablet, Take 81 mg by mouth at bedtime., Disp: , Rfl:  .  B Complex-C (B-COMPLEX WITH VITAMIN C) tablet, Take 1 tablet by mouth daily., Disp: , Rfl:  .  bismuth subsalicylate (PEPTO BISMOL) 262 MG/15ML suspension, Take 30 mLs by mouth every 6 (six) hours as needed for indigestion or diarrhea or loose stools., Disp: , Rfl:  .  busPIRone (BUSPAR) 5 MG tablet, Take 5 mg by mouth 2 (two) times daily., Disp: , Rfl:  .  Calcium Carbonate-Vitamin D (CALCIUM 600+D PO), Take 1 tablet by mouth 2 (two) times daily., Disp: , Rfl:  .  chlorpheniramine (CHLOR-TRIMETON) 4 MG tablet, Take 4 mg by mouth daily., Disp: , Rfl:  .  chlorthalidone (HYGROTON) 25 MG tablet, Take 25 mg by mouth daily., Disp: , Rfl:  .  enoxaparin (LOVENOX) 40 MG/0.4ML injection, Inject 1.2 mLs (120 mg total) into the skin daily., Disp: 3 Syringe, Rfl: 0 .  ferrous sulfate 325 (65 FE) MG EC tablet, Take 1 tablet (325 mg total) by mouth 3 (three) times daily with meals., Disp: 90 tablet, Rfl: 3 .  fluticasone (FLONASE) 50 MCG/ACT nasal spray, Place 1 spray into both nostrils at bedtime. , Disp: , Rfl:  .  gabapentin (NEURONTIN) 600 MG tablet, Take 600 mg by mouth 2 (two) times daily. , Disp: , Rfl:  .  lidocaine (XYLOCAINE) 2 % solution, TAKE 2 TEASPOONFULS BEFORE MEALS AND AT BEDTIME AS NEEDED MAY REPEAT EVERY 4 HOURS. (MAX OF 8 DOSES PER DAY) (Patient taking differently: TAKE 2 TEASPOONFULS BEFORE MEALS  AND AT BEDTIME AS NEEDED FOR STOMACH PAIN MAY REPEAT EVERY 4 HOURS. (MAX OF 8 DOSES PER DAY)), Disp: 300 mL, Rfl: 0 .  losartan (COZAAR) 100 MG tablet, Take 100 mg by mouth daily., Disp: , Rfl:  .  metoprolol succinate (TOPROL-XL) 50 MG 24 hr tablet, Take 50 mg by mouth daily. , Disp: , Rfl:  .  montelukast (SINGULAIR) 10 MG tablet, Take 10 mg by mouth daily. , Disp: , Rfl:  .  nitroGLYCERIN (NITROSTAT) 0.4 MG SL tablet, Place 0.4 mg under the tongue every 5 (five) minutes as needed for chest pain. , Disp: , Rfl:  .  pantoprazole (PROTONIX) 40 MG tablet, 1 po 30 mins prior to first and last meal (Patient taking differently: Take 40 mg by mouth daily. ), Disp: 60 tablet, Rfl: 11 .  polyethylene glycol (MIRALAX /  GLYCOLAX) packet, Take 17 g by mouth daily as needed for mild constipation or moderate constipation. , Disp: , Rfl:  .  potassium chloride (K-DUR,KLOR-CON) 10 MEQ tablet, Take 20 mEq by mouth 2 (two) times daily. , Disp: , Rfl:  .  Probiotic Product (PROBIOTIC PO), Take 1 capsule by mouth daily. [facility]., Disp: , Rfl:  .  sertraline (ZOLOFT) 100 MG tablet, Take 200 mg by mouth daily. , Disp: , Rfl:  .  Simethicone (GAS-X PO), Take 2 tablets by mouth daily as needed (gas)., Disp: , Rfl:  .  simvastatin (ZOCOR) 40 MG tablet, Take 40 mg by mouth at bedtime.  , Disp: , Rfl:  .  tolterodine (DETROL LA) 4 MG 24 hr capsule, Take 4 mg by mouth at bedtime. , Disp: , Rfl:  .  vitamin C (ASCORBIC ACID) 500 MG tablet, Take 500 mg by mouth daily., Disp: , Rfl:  .  warfarin (COUMADIN) 5 MG tablet, Take 2.5-5 mg by mouth See admin instructions. Alternate take 1/2 tab (2.37m) with 1 tab (536m every other day (2.5,5,2.5,5,...), Disp: , Rfl:   Allergies Biaxin [clarithromycin] and Lisinopril  Review of Systems Review of Systems - Oncology ROS as per HPI otherwise 12 point ROS is negative.   Physical Exam  Vitals Wt Readings from Last 3 Encounters:  08/07/17 188 lb 3.2 oz (85.4 kg)   06/23/17 191 lb 3.2 oz (86.7 kg)  06/07/17 187 lb (84.8 kg)   Temp Readings from Last 3 Encounters:  08/07/17 97.8 F (36.6 C) (Oral)  06/23/17 98.1 F (36.7 C) (Oral)  05/22/17 97.7 F (36.5 C) (Oral)   BP Readings from Last 3 Encounters:  08/07/17 (!) 143/68  06/23/17 139/71  05/22/17 126/77   Pulse Readings from Last 3 Encounters:  08/07/17 62  06/23/17 82  05/22/17 60    Constitutional: Well-developed, well-nourished, and in no distress.   HENT: Head: Normocephalic and atraumatic.  Mouth/Throat: No oropharyngeal exudate. Mucosa moist. Eyes: Pupils are equal, round, and reactive to light. Conjunctivae are normal. No scleral icterus.  Neck: Normal range of motion. Neck supple. No JVD present.  Cardiovascular: Normal rate, regular rhythm and normal heart sounds.  Exam reveals no gallop and no friction rub.   No murmur heard. Pulmonary/Chest: Effort normal and breath sounds normal. No respiratory distress. No wheezes.No rales.  Abdominal: Soft. Bowel sounds are normal. No distension. There is no tenderness. There is no guarding.  Musculoskeletal: No edema or tenderness.  Lymphadenopathy: No cervical, axillary or supraclavicular adenopathy.  Neurological: Alert and oriented to person, place, and time. No cranial nerve deficit.  Skin: Skin is warm and dry. No rash noted. No erythema. No pallor.  Psychiatric: Affect and judgment normal.   Labs Appointment on 08/21/2017  Component Date Value Ref Range Status  . WBC 08/21/2017 7.5  4.0 - 10.5 K/uL Final  . RBC 08/21/2017 4.05  3.87 - 5.11 MIL/uL Final  . Hemoglobin 08/21/2017 11.4* 12.0 - 15.0 g/dL Final  . HCT 08/21/2017 36.2  36.0 - 46.0 % Final  . MCV 08/21/2017 89.4  78.0 - 100.0 fL Final  . MCH 08/21/2017 28.1  26.0 - 34.0 pg Final  . MCHC 08/21/2017 31.5  30.0 - 36.0 g/dL Final  . RDW 08/21/2017 21.2* 11.5 - 15.5 % Final  . Platelets 08/21/2017 155  150 - 400 K/uL Final  . Neutrophils Relative % 08/21/2017 59  %  Final  . Lymphocytes Relative 08/21/2017 27  % Final  . Monocytes  Relative 08/21/2017 10  % Final  . Eosinophils Relative 08/21/2017 3  % Final  . Basophils Relative 08/21/2017 1  % Final  . Neutro Abs 08/21/2017 4.4  1.7 - 7.7 K/uL Final  . Lymphs Abs 08/21/2017 2.0  0.7 - 4.0 K/uL Final  . Monocytes Absolute 08/21/2017 0.8  0.1 - 1.0 K/uL Final  . Eosinophils Absolute 08/21/2017 0.2  0.0 - 0.7 K/uL Final  . Basophils Absolute 08/21/2017 0.1  0.0 - 0.1 K/uL Final  . RBC Morphology 08/21/2017 ANISOCYTES   Final   Performed at [facility], [address], [place], [place] [address]  . Sodium 08/21/2017 137  135 - 145 mmol/L Final  . Potassium 08/21/2017 3.8  3.5 - 5.1 mmol/L Final  . Chloride 08/21/2017 102  101 - 111 mmol/L Final  . CO2 08/21/2017 24  22 - 32 mmol/L Final  . Glucose, Bld 08/21/2017 95  65 - 99 mg/dL Final  . BUN 08/21/2017 12  6 - 20 mg/dL Final  . Creatinine, Ser 08/21/2017 0.76  0.44 - 1.00 mg/dL Final  . Calcium 08/21/2017 9.1  8.9 - 10.3 mg/dL Final  . Total Protein 08/21/2017 8.9* 6.5 - 8.1 g/dL Final  . Albumin 08/21/2017 3.9  3.5 - 5.0 g/dL Final  . AST 08/21/2017 31  15 - 41 U/L Final  . ALT 08/21/2017 21  14 - 54 U/L Final  . Alkaline Phosphatase 08/21/2017 81  38 - 126 U/L Final  . Total Bilirubin 08/21/2017 0.7  0.3 - 1.2 mg/dL Final  . GFR calc non Af Amer 08/21/2017 >60  >60 mL/min Final  . GFR calc Af Amer 08/21/2017 >60  >60 mL/min Final   Comment: (NOTE) The eGFR has been calculated using the CKD EPI equation. This calculation has not been validated in all clinical situations. eGFR's persistently <60 mL/min signify possible Chronic Kidney Disease.   Georgiann Hahn gap 08/21/2017 11  5 - 15 Final   Performed at [facility], [address], [place], [place] [address]     Pathology Orders Placed This Encounter  Procedures  . CBC with Differential/Platelet    Standing Status:   Future    Standing Expiration Date:   08/22/2018  . Comprehensive metabolic  panel    Standing Status:   Future    Standing Expiration Date:   08/22/2018  . Lactate dehydrogenase    Standing Status:   Future    Standing Expiration Date:   08/22/2018  . Ferritin    Standing Status:   Future    Standing Expiration Date:   08/22/2018       Zoila Shutter MD

## 2017-11-08 ENCOUNTER — Encounter: Payer: Self-pay | Admitting: Family Medicine

## 2017-11-08 ENCOUNTER — Telehealth: Payer: Self-pay | Admitting: Gastroenterology

## 2017-11-08 NOTE — Telephone Encounter (Signed)
Pt is aware and the hand out has been placed in the mail.

## 2017-11-08 NOTE — Telephone Encounter (Signed)
PLEASE CALL PT. BLOATING AND GAS COME FROM WHAT SHE IS EATING. FAST FOOD IS NOT GOOD FOR HER GUT. Courtney Rogers SHOULD AVOID FAST FOOD AND BREAD. SHE SHOULD FOLLOW A LOW FODMAP DIET FOR 6 WEEKS. WE CAN MAIL HER A HANDOUT.

## 2017-11-08 NOTE — Telephone Encounter (Signed)
I called pt and she said for the last couple of weeks she has had a lot of gas right below her shoulder blade and it is sometimes on the left side and sometimes on the right side.  Today, she has been experiencing the pain some since 11:00 am on the left side.   She takes the pantoprazole bid and takes Gas X and pepto and sometimes Alka Seltzer, but it is not helping a lot.   She does eat a lot of fast foods like Subs and from [employer].  She was just seen at Cardiologist in [place] today, and he told her she needs to follow GI for these symptoms.  Dr. Oneida Alar, please advise!

## 2017-11-08 NOTE — Telephone Encounter (Signed)
Pt called asking to speak with the nurse about having gas and bloating. I offered her an OV for tomorrow, but she can't come,She is already scheduled to see SF on 7/17 for gas. Pt wants nurse to call her at [phone]

## 2017-11-13 ENCOUNTER — Other Ambulatory Visit: Payer: Self-pay

## 2017-11-13 ENCOUNTER — Emergency Department (HOSPITAL_COMMUNITY)
Admission: EM | Admit: 2017-11-13 | Discharge: 2017-11-14 | Disposition: A | Discharge status: Home / Self Care | Payer: Medicare Other | Attending: Emergency Medicine | Admitting: Emergency Medicine

## 2017-11-13 ENCOUNTER — Encounter (HOSPITAL_COMMUNITY): Payer: Self-pay | Admitting: Emergency Medicine

## 2017-11-13 ENCOUNTER — Emergency Department (HOSPITAL_COMMUNITY): Payer: Medicare Other

## 2017-11-13 DIAGNOSIS — K589 Irritable bowel syndrome without diarrhea: Secondary | ICD-10-CM | POA: Diagnosis not present

## 2017-11-13 DIAGNOSIS — Z950 Presence of cardiac pacemaker: Secondary | ICD-10-CM | POA: Insufficient documentation

## 2017-11-13 DIAGNOSIS — Z86711 Personal history of pulmonary embolism: Secondary | ICD-10-CM | POA: Insufficient documentation

## 2017-11-13 DIAGNOSIS — M549 Dorsalgia, unspecified: Secondary | ICD-10-CM

## 2017-11-13 DIAGNOSIS — Z79899 Other long term (current) drug therapy: Secondary | ICD-10-CM | POA: Insufficient documentation

## 2017-11-13 DIAGNOSIS — M5489 Other dorsalgia: Secondary | ICD-10-CM | POA: Diagnosis not present

## 2017-11-13 DIAGNOSIS — I259 Chronic ischemic heart disease, unspecified: Secondary | ICD-10-CM | POA: Diagnosis not present

## 2017-11-13 DIAGNOSIS — R0602 Shortness of breath: Secondary | ICD-10-CM | POA: Insufficient documentation

## 2017-11-13 DIAGNOSIS — Z7901 Long term (current) use of anticoagulants: Secondary | ICD-10-CM | POA: Insufficient documentation

## 2017-11-13 DIAGNOSIS — Z7982 Long term (current) use of aspirin: Secondary | ICD-10-CM | POA: Insufficient documentation

## 2017-11-13 LAB — CBC
HCT: 37.4 % (ref 36.0–46.0)
Hemoglobin: 12.2 g/dL (ref 12.0–15.0)
MCH: 31.2 pg (ref 26.0–34.0)
MCHC: 32.6 g/dL (ref 30.0–36.0)
MCV: 95.7 fL (ref 78.0–100.0)
Platelets: 160 10*3/uL (ref 150–400)
RBC: 3.91 MIL/uL (ref 3.87–5.11)
RDW: 16 % — ABNORMAL HIGH (ref 11.5–15.5)
WBC: 5.9 10*3/uL (ref 4.0–10.5)

## 2017-11-13 LAB — COMPREHENSIVE METABOLIC PANEL
ALT: 22 U/L (ref 14–54)
AST: 33 U/L (ref 15–41)
Albumin: 4 g/dL (ref 3.5–5.0)
Alkaline Phosphatase: 61 U/L (ref 38–126)
Anion gap: 9 (ref 5–15)
BUN: 9 mg/dL (ref 6–20)
CO2: 28 mmol/L (ref 22–32)
Calcium: 9.6 mg/dL (ref 8.9–10.3)
Chloride: 104 mmol/L (ref 101–111)
Creatinine, Ser: 0.86 mg/dL (ref 0.44–1.00)
GFR calc Af Amer: >60 mL/min (ref >60)
GFR calc non Af Amer: >60 mL/min (ref >60)
Glucose, Bld: 89 mg/dL (ref 65–99)
Potassium: 4.2 mmol/L (ref 3.5–5.1)
Sodium: 141 mmol/L (ref 135–145)
Total Bilirubin: 0.9 mg/dL (ref 0.3–1.2)
Total Protein: 8.9 g/dL — ABNORMAL HIGH (ref 6.5–8.1)

## 2017-11-13 LAB — D-DIMER, QUANTITATIVE (NOT AT ARMC): D-Dimer, Quant: 0.43 ug/mL-FEU (ref 0.00–0.50)

## 2017-11-13 LAB — TROPONIN I
Troponin I: <0.03 ng/mL (ref <0.03)
Troponin I: <0.03 ng/mL (ref <0.03)

## 2017-11-13 LAB — PROTIME-INR
INR: 1.55
Prothrombin Time: 18.5 seconds — ABNORMAL HIGH (ref 11.4–15.2)

## 2017-11-13 LAB — LIPASE, BLOOD: Lipase: 44 U/L (ref 11–51)

## 2017-11-13 NOTE — ED Triage Notes (Signed)
Pt C/O upper back pain "in between the shoulder blades" that has been going on for 1 month. Pt states that today around 1800 it began to get worse. Pt reports sweating and hot flashes. Pt denies N/V.

## 2017-11-13 NOTE — ED Provider Notes (Signed)
[facility] EMERGENCY DEPARTMENT Provider Note   CSN: [id number] Arrival date & time: 11/13/17  1942     History   Chief Complaint Chief Complaint  Patient presents with  . Back Pain    HPI Courtney Rogers is a 67 y.o. female.  Patient reports left-sided mid back pain that woke her from sleep this morning.  Pain became progressively worse throughout the day and was associated with some hot flashes.  No nausea, vomiting, shortness of breath or chest pain.  Patient states she is had this pain on and off for the past 1 month, it occurs maybe 3 or 4 times a week and lasts for less than an hour at a time.  Today has been lasting all day.  Is worse with movement.  No chest pain or shortness of breath.  She does have a pacemaker and is on Coumadin for history of pulmonary embolism.  Reports MI in the past that was medically managed.  Denies any weakness, numbness, tingling.  No abdominal pain, nausea, vomiting, cough or fever.  No rash.  The history is provided by the patient.  Back Pain   Pertinent negatives include no chest pain, no fever, no headaches, no abdominal pain, no dysuria and no weakness.    Past Medical History:  Diagnosis Date  . Acute myocardial infarction [facility]) 2009   CAD/no stent medically managed  . Anxiety disorder   . CAD (coronary artery disease)   . Hyperlipidemia   . Hypertension   . IBS (irritable bowel syndrome)   . Overactive bladder   . PE (pulmonary embolism) JUN 2014  . Presence of permanent cardiac pacemaker   . Sleep apnea     Patient Active Problem List   Diagnosis Date Noted  . Iron deficiency anemia 08/03/2017  . Dyspepsia   . Coccydynia 04/19/2017  . Normocytic anemia 01/12/2017  . Fatty liver 11/03/2015  . Anxiety state 11/03/2015  . Weight loss 04/05/2011  . GERD 06/01/2010  . IRRITABLE BOWEL SYNDROME 07/21/2009  . Non-ulcer dyspepsia 04/30/2009    Past Surgical History:  Procedure Laterality Date  . ABDOMINAL HYSTERECTOMY      . BSO secondary to cyst    . CARDIAC CATHETERIZATION    . COLONOSCOPY  12/2009   Dr. West Carbo, propofol, normal. Next TCS 12/2019  . COLONOSCOPY N/A 05/22/2017   Procedure: COLONOSCOPY;  Surgeon: Danie Binder, MD;  Location: [facility] ENDO SUITE;  Service: Endoscopy;  Laterality: N/A;  10:30am  . ESOPHAGOGASTRODUODENOSCOPY  05/11/09   schatzki ring/small hiatal hernia/path:gastritis  . ESOPHAGOGASTRODUODENOSCOPY N/A 05/22/2017   Procedure: ESOPHAGOGASTRODUODENOSCOPY (EGD);  Surgeon: Danie Binder, MD;  Location: [facility] ENDO SUITE;  Service: Endoscopy;  Laterality: N/A;  . ESOPHAGOGASTRODUODENOSCOPY (EGD) WITH ESOPHAGEAL DILATION N/A 04/01/2013   JHE:[id number] at the gastroesophageal juction/multiple small polyps/mild gastritis  . GIVENS CAPSULE STUDY N/A 06/07/2017   Procedure: GIVENS CAPSULE STUDY;  Surgeon: Danie Binder, MD;  Location: [facility] END[facility] SUITE;  Service: Endoscopy;  Laterality: N/A;  7:30am  . INSERT / REPLACE / REMOVE PACEMAKER     Last year per pt.(can't remember date)     OB History   None      Home Medications    Prior to Admission medications   Medication Sig Start Date End Date Taking? Authorizing Provider  acetaminophen (TYLENOL) 500 MG tablet Take 1,000 mg by mouth 2 (two) times daily as needed for moderate pain or headache.    [provider]  albuterol (PROVENTIL HFA;VENTOLIN HFA) 108 (  90 BASE) MCG/ACT inhaler Inhale 2 puffs into the lungs every 6 (six) hours as needed for shortness of breath.    [provider]  amLODipine (NORVASC) 10 MG tablet Take 10 mg by mouth daily.    [provider]  aspirin EC 81 MG tablet Take 81 mg by mouth at bedtime.    [provider]  B Complex-C (B-COMPLEX WITH VITAMIN C) tablet Take 1 tablet by mouth daily.    [provider]  bismuth subsalicylate (PEPTO BISMOL) 262 MG/15ML suspension Take 30 mLs by mouth every 6 (six) hours as needed for indigestion or diarrhea or loose stools.     [provider]  busPIRone (BUSPAR) 5 MG tablet Take 5 mg by mouth 2 (two) times daily. 11/25/16   [provider]  Calcium Carbonate-Vitamin D (CALCIUM 600+D PO) Take 1 tablet by mouth 2 (two) times daily.    [provider]  chlorthalidone (HYGROTON) 25 MG tablet Take 25 mg by mouth daily.    [provider]  fluticasone (FLONASE) 50 MCG/ACT nasal spray Place 1 spray into both nostrils at bedtime.     [provider]  lidocaine (XYLOCAINE) 2 % solution TAKE 2 TEASPOONFULS BEFORE MEALS AND AT BEDTIME AS NEEDED MAY REPEAT EVERY 4 HOURS. (MAX OF 8 DOSES PER DAY) Patient taking differently: TAKE 2 TEASPOONFULS BEFORE MEALS AND AT BEDTIME AS NEEDED FOR STOMACH PAIN MAY REPEAT EVERY 4 HOURS. (MAX OF 8 DOSES PER DAY) 04/18/17   Mahala Menghini, PA-C  losartan (COZAAR) 100 MG tablet Take 100 mg by mouth daily.    [provider]  metoprolol succinate (TOPROL-XL) 50 MG 24 hr tablet Take 50 mg by mouth daily.  11/25/16   [provider]  montelukast (SINGULAIR) 10 MG tablet Take 10 mg by mouth daily.     [provider]  nabumetone (RELAFEN) 750 MG tablet  08/10/17   [provider]  nitroGLYCERIN (NITROSTAT) 0.4 MG SL tablet Place 0.4 mg under the tongue every 5 (five) minutes as needed for chest pain.     [provider]  pantoprazole (PROTONIX) 40 MG tablet 1 po 30 mins prior to first and last meal Patient taking differently: Take 40 mg by mouth daily.  01/12/17   Fields, Marga Melnick, MD  polyethylene glycol (MIRALAX / GLYCOLAX) packet Take 17 g by mouth daily as needed for mild constipation or moderate constipation.     [provider]  potassium chloride (K-DUR,KLOR-CON) 10 MEQ tablet Take 20 mEq by mouth 2 (two) times daily.     [provider]  predniSONE (STERAPRED UNI-PAK 21 TAB) 5 MG (21) TBPK tablet  08/10/17   [provider]  Probiotic Product (PROBIOTIC PO) Take 1 capsule by mouth daily.  [facility].    [provider]  sertraline (ZOLOFT) 100 MG tablet Take 200 mg by mouth daily.     [provider]  Simethicone (GAS-X PO) Take 2 tablets by mouth daily as needed (gas).    [provider]  simvastatin (ZOCOR) 40 MG tablet Take 40 mg by mouth at bedtime.      [provider]  vitamin C (ASCORBIC ACID) 500 MG tablet Take 500 mg by mouth daily.    [provider]  warfarin (COUMADIN) 5 MG tablet Take 2.5-5 mg by mouth See admin instructions. Alternate take 1/2 tab (2.5mg ) with 1 tab (5mg ) every other day (2.5,5,2.5,5,...)    [provider]    Family  History Family History  Problem Relation Age of Onset  . Colon polyps Neg Hx   . Colon cancer Neg Hx     Social History Social History   Tobacco Use  . Smoking status: Never Smoker  . Smokeless tobacco: Never Used  . Tobacco comment: Never smoked   Substance Use Topics  . Alcohol use: No    Alcohol/week: 0.0 oz  . Drug use: No     Allergies   Biaxin [clarithromycin] and Lisinopril   Review of Systems Review of Systems  Constitutional: Negative for activity change, appetite change and fever.  HENT: Negative for congestion and rhinorrhea.   Eyes: Negative for visual disturbance.  Respiratory: Positive for shortness of breath. Negative for chest tightness.   Cardiovascular: Negative for chest pain and leg swelling.  Gastrointestinal: Negative for abdominal pain, nausea and vomiting.  Genitourinary: Negative for dysuria, hematuria, vaginal bleeding and vaginal discharge.  Musculoskeletal: Positive for arthralgias, back pain and myalgias.  Neurological: Negative for dizziness, weakness and headaches.    all other systems are negative except as noted in the HPI and PMH.    Physical Exam Updated Vital Signs BP (!) 165/79   Pulse 62   Temp (!) 97.4 F (36.3 C) (Oral)   Resp 15   Ht 5\' 5"  (1.651 m)   Wt 86.2 kg (190 lb)   SpO2 97%   BMI 31.62  kg/m   Physical Exam  Constitutional: She is oriented to person, place, and time. She appears well-developed and well-nourished. No distress.  HENT:  Head: Normocephalic and atraumatic.  Mouth/Throat: Oropharynx is clear and moist. No oropharyngeal exudate.  Eyes: Pupils are equal, round, and reactive to light. Conjunctivae and EOM are normal.  Neck: Normal range of motion. Neck supple.  No meningismus.  Cardiovascular: Normal rate, regular rhythm, normal heart sounds and intact distal pulses.  No murmur heard. Equal radial pulses and grip strength  Pulmonary/Chest: Effort normal and breath sounds normal. No respiratory distress.  Abdominal: Soft. There is no tenderness. There is no rebound and no guarding.  Musculoskeletal: Normal range of motion. She exhibits tenderness. She exhibits no edema.  Left mid back has mild erythema which appears to be from patient scratching.  There are no vesicular lesions.  No rash.  Pain is not reproducible.  Neurological: She is alert and oriented to person, place, and time. No cranial nerve deficit. She exhibits normal muscle tone. Coordination normal.   5/5 strength throughout. CN 2-12 intact.Equal grip strength.   Skin: Skin is warm.  Psychiatric: She has a normal mood and affect. Her behavior is normal.  Nursing note and vitals reviewed.    ED Treatments / Results  Labs (all labs ordered are listed, but only abnormal results are displayed) Labs Reviewed  CBC - Abnormal; Notable for the following components:      Result Value   RDW 16.0 (*)    All other components within normal limits  COMPREHENSIVE METABOLIC PANEL - Abnormal; Notable for the following components:   Total Protein 8.9 (*)    All other components within normal limits  PROTIME-INR - Abnormal; Notable for the following components:   Prothrombin Time 18.5 (*)    All other components within normal limits  TROPONIN I  TROPONIN I  D-DIMER, QUANTITATIVE (NOT AT [facility])  LIPASE,  BLOOD    EKG None  Radiology Dg Chest 2 View  Result Date: 11/13/2017 CLINICAL DATA:  Chest pain EXAM: CHEST - 2 VIEW COMPARISON:  01/07/2017 chest  radiograph. FINDINGS: Stable configuration of 2 lead left subclavian pacemaker. Stable cardiomediastinal silhouette with top-normal heart size. No pneumothorax. No pleural effusion. Stable mild scarring at the left greater than right lung bases. No pulmonary edema. No acute consolidative airspace disease. IMPRESSION: Stable mild bibasilar scarring.  No acute cardiopulmonary disease. Electronically Signed   By: Ilona Sorrel M.D.   On: 11/13/2017 20:29   Ct Angio Chest/abd/pel For Dissection W And/or Wo Contrast  Result Date: 11/14/2017 CLINICAL DATA:  67 year old female with upper back pain. Concern for aortic dissection. EXAM: CT ANGIOGRAPHY CHEST, ABDOMEN AND PELVIS TECHNIQUE: Multidetector CT imaging through the chest, abdomen and pelvis was performed using the standard protocol during bolus administration of intravenous contrast. Multiplanar reconstructed images and MIPs were obtained and reviewed to evaluate the vascular anatomy. CONTRAST:  188mL ISOVUE-370 IOPAMIDOL (ISOVUE-370) INJECTION 76% COMPARISON:  Chest radiograph dated 11/13/2017 and CT of the abdomen pelvis dated 05/05/2009 FINDINGS: CTA CHEST FINDINGS Cardiovascular: There is moderate cardiomegaly with dilatation of the left heart chambers. No pericardial effusion. Cardiac pacemaker wires noted. The thoracic aorta is unremarkable. No aneurysmal dilatation or dissection. The origins of the great vessels of the aortic arch are patent. There is dilatation of the central pulmonary arteries suggestive of underlying pulmonary hypertension. No large or central pulmonary artery embolus identified. Evaluation of the pulmonary arteries is limited due to suboptimal opacification and timing of the contrast. Mediastinum/Nodes: There is no hilar or mediastinal adenopathy. Esophagus is grossly unremarkable.  No mediastinal fluid collection. The thyroid gland is unremarkable as visualized. Lungs/Pleura: There are bibasilar linear atelectasis/scarring. There is no focal consolidation, pleural effusion, or pneumothorax. The central airways are patent. Musculoskeletal: Degenerative changes of the spine. No acute osseous pathology. Review of the MIP images confirms the above findings. CTA ABDOMEN AND PELVIS FINDINGS VASCULAR Aorta: Normal caliber aorta without aneurysm, dissection, vasculitis or significant stenosis. Celiac: Patent without evidence of aneurysm, dissection, vasculitis or significant stenosis. SMA: Patent without evidence of aneurysm, dissection, vasculitis or significant stenosis. Renals: Both renal arteries are patent without evidence of aneurysm, dissection, vasculitis, fibromuscular dysplasia or significant stenosis. 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 IMA: Patent without evidence of aneurysm, dissection, vasculitis or significant stenosis. Inflow: Patent without evidence of aneurysm, dissection, vasculitis or significant stenosis. Veins: No obvious venous abnormality within the limitations of this arterial phase study. Review of the MIP images confirms the above findings. NON-VASCULAR No intra-abdominal free air or free fluid. Hepatobiliary: The liver is unremarkable. Small amount of sludge may be present within the gallbladder. No pericholecystic fluid. Pancreas: Unremarkable. No pancreatic ductal dilatation or surrounding inflammatory changes. Spleen: Normal in size without focal abnormality. Adrenals/Urinary Tract: The adrenal glands are unremarkable. There is a 2.5 cm right renal inferior pole cyst. The left kidney is unremarkable. The visualized ureters and urinary bladder appear unremarkable. Stomach/Bowel: There are small scattered colonic diverticula without active inflammatory changes. There is no bowel obstruction or active inflammation. The appendix is not visualized with  certainty. No inflammatory changes identified in the right lower quadrant. Lymphatic: No adenopathy. Reproductive: Hysterectomy.  No pelvic mass. Other: None Musculoskeletal: Degenerative changes of the spine and multilevel facet hypertrophy. No acute osseous pathology. Review of the MIP images confirms the above findings. IMPRESSION: 1. No acute intrathoracic, abdominal, or pelvic pathology. No aortic aneurysm or dissection. No CT evidence of large or central pulmonary artery embolism. 2. Cardiomegaly with findings of pulmonary hypertension. 3. Scattered colonic diverticula without active inflammatory changes. No bowel obstruction. Electronically Signed  By: Anner Crete M.D.   On: 11/14/2017 02:13    Procedures Procedures (including critical care time)  Medications Ordered in ED Medications - No data to display   Initial Impression / Assessment and Plan / ED Course  I have reviewed the triage vital signs and the nursing notes.  Pertinent labs & imaging results that were available during my care of the patient were reviewed by me and considered in my medical decision making (see chart for details).    1 month of intermittent left mid back pain that became worse today.  No chest pain.  EKG is paced. Chest x-ray is negative.  Consider pulmonary embolism though the patient is on Coumadin.  CT angiogram is negative for aortic dissection or pulmonary embolism.  INR is slightly low at 1.5.  Troponin negative x2.  Discussed with patient that her INR is slightly low.  Her work-up today is negative for any evidence of ACS, pulmonary embolism or aortic dissection.  Advised patient to watch her closely to see if she develops a rash as this may represent shingles. Follow-up with PCP.  Return precautions discussed.  Final Clinical Impressions(s) / ED Diagnoses   Final diagnoses:  Upper back pain on left side    ED Discharge Orders    None       Lisvet Rasheed, Annie Main, MD 11/14/17 [phone]

## 2017-11-14 ENCOUNTER — Emergency Department (HOSPITAL_COMMUNITY): Payer: Medicare Other

## 2017-11-14 ENCOUNTER — Encounter: Payer: Self-pay | Admitting: Family Medicine

## 2017-11-14 ENCOUNTER — Ambulatory Visit (INDEPENDENT_AMBULATORY_CARE_PROVIDER_SITE_OTHER): Payer: Medicare Other | Admitting: Family Medicine

## 2017-11-14 VITALS — BP 160/82 | HR 84 | Resp 16 | Ht 65.0 in | Wt 186.4 lb

## 2017-11-14 DIAGNOSIS — M546 Pain in thoracic spine: Secondary | ICD-10-CM | POA: Diagnosis not present

## 2017-11-14 DIAGNOSIS — I10 Essential (primary) hypertension: Secondary | ICD-10-CM

## 2017-11-14 DIAGNOSIS — G8929 Other chronic pain: Secondary | ICD-10-CM | POA: Diagnosis not present

## 2017-11-14 DIAGNOSIS — M5489 Other dorsalgia: Secondary | ICD-10-CM | POA: Diagnosis not present

## 2017-11-14 MED ORDER — CYCLOBENZAPRINE HCL 10 MG PO TABS: 10.0000 mg | ORAL_TABLET | Freq: Three times a day (TID) | ORAL | 0 refills | Status: DC | PRN

## 2017-11-14 MED ORDER — IOPAMIDOL (ISOVUE-370) INJECTION 76%
100.0000 mL | Freq: Once | INTRAVENOUS | Status: AC | PRN
  Administered 2017-11-14: 100 mL via INTRAVENOUS

## 2017-11-14 MED ORDER — IBUPROFEN 400 MG PO TABS
600.0000 mg | ORAL_TABLET | Freq: Once | ORAL | Status: AC
  Administered 2017-11-14: 600 mg via ORAL
  Filled 2017-11-14: qty 2

## 2017-11-14 MED ORDER — AMLODIPINE BESYLATE 5 MG PO TABS: 5.0000 mg | ORAL_TABLET | Freq: Every day | ORAL | 3 refills | Status: DC

## 2017-11-14 MED ORDER — KETOROLAC TROMETHAMINE 60 MG/2ML IM SOLN
60.0000 mg | Freq: Once | INTRAMUSCULAR | Status: AC
  Administered 2017-11-14: 60 mg via INTRAMUSCULAR

## 2017-11-14 NOTE — ED Notes (Signed)
Pt ambulatory to waiting room. Pt verbalized understanding of discharge instructions.   

## 2017-11-14 NOTE — Discharge Instructions (Addendum)
There is no evidence of heart attack or problem with the aorta.  There is no evidence of blood clot.  Your Coumadin level is slightly low at 1.5.  Follow-up with your doctor for a dosing of this.  Return to the ED if you develop new or worsening symptoms.

## 2017-11-14 NOTE — Progress Notes (Signed)
Patient ID: Courtney Rogers, female    DOB: 01/26/1951, 67 y.o.   MRN: [id number]  Chief Complaint  Patient presents with  . Back Pain    pain in her upper back on both sides of her shoulder blades x 1 month. Went to ER lastnight and they scanned her/did EKG  and could not find a reason for the pain     Allergies Biaxin [clarithromycin] and Lisinopril  Subjective:   Courtney Rogers is a 67 y.o. female who presents to [facility] today.  HPI Mrs. Courtney Rogers presents today as a new patient visit to establish care.  She reports that she is very tired today because she was in the emergency department on night last night.  She reports that she left from there this morning.  She reports that she went to the emergency department because of this pain in her back.  She reports that she had quite a few test done in the emergency department and it was thought that her back pain was most likely musculoskeletal.  She reports she was told to take some ibuprofen over-the-counter for the pain.  She reports that she is still in pain today and it is bothering her.  She reports that the pain in her upper back has been bothering her for several months.  She correlates this to helping with moving and lifting her husband over the past year.  She reports that her pain is worse as the day goes on and she uses the muscles of her back.  She reports it is better when she stretches at her back and at her shoulders.  She reports that several months ago it also included the muscles of her neck but now it is just the back.  She denies any numbness or tingling.  She reports she is breathing well.  Her energy level is good.  At the emergency department she had an EKG, chest x-ray, lab test, CT angiogram performed.  She was ruled out for heart attack.  She was told that she did not have a pulmonary embolism or aortic dissection.  She reports that she had lots of test but is still having the same pain.  She reports  that several months ago she was seen by another doctor for this and was given a Robaxin muscle relaxer but it did not help.  She would like to try different muscle relaxer.  She reports that she occasionally takes Tylenol.  She has not used a heating pad.  She reports that yesterday her pain was a 10 out of 10.  She reports that today it is a 5 out of 10.  She reports that it can be stinging in quality at times like a sore muscle.  She is followed by Dr. Koren Bound for her mood disorder.  Denies any suicidal or homicidal ideation.  Reports that she is doing pretty well for her mood.   Is followed by Dr. Chahaun/cardiology in [place].  Reports that she is supposed to call his office today because her INR at the emergency department was low.  She reports that she goes in every month to get it checked.  She denies missing her medications.  She reports that at her last cardiology visit her Norvasc was stopped and her chlorthalidone was decreased in half.  She reports that since that time her blood pressure is been running a little bit high.  However, she reports that with the 10 mg of  Norvasc and the full dose of chlorthalidone that her blood pressure was too low.   Past Medical History:  Diagnosis Date  . Acute myocardial infarction [facility]) 2009   CAD/no stent medically managed  . Anxiety disorder   . CAD (coronary artery disease)   . Hyperlipidemia   . Hypertension   . IBS (irritable bowel syndrome)   . Overactive bladder   . PE (pulmonary embolism) JUN 2014  . Presence of permanent cardiac pacemaker   . Sleep apnea     Past Surgical History:  Procedure Laterality Date  . ABDOMINAL HYSTERECTOMY    . BSO secondary to cyst    . CARDIAC CATHETERIZATION    . COLONOSCOPY  12/2009   Dr. West Carbo, propofol, normal. Next TCS 12/2019  . COLONOSCOPY N/A 05/22/2017   Procedure: COLONOSCOPY;  Surgeon: Danie Binder, MD;  [facility];  Service:  Endoscopy;  Laterality: N/A;  10:30am  . ESOPHAGOGASTRODUODENOSCOPY  05/11/09   schatzki ring/small hiatal hernia/path:gastritis  . ESOPHAGOGASTRODUODENOSCOPY N/A 05/22/2017   Procedure: ESOPHAGOGASTRODUODENOSCOPY (EGD);  Surgeon: Danie Binder, MD;  Location[facility];  Service: Endoscopy;  Laterality: N/A;  . ESOPHAGOGASTRODUODENOSCOPY (EGD) WITH ESOPHAGEAL DILATION N/A 04/01/2013   [id number] at the gastroesophageal juction/multiple small polyps/mild gastritis  . GIVENS CAPSULE STUDY N/A 06/07/2017   Procedure: GIVENS CAPSULE STUDY;  Surgeon: Danie Binder, MD;  Location: [facility];  Service: Endoscopy;  Laterality: N/A;  7:30am  . INSERT / REPLACE / REMOVE PACEMAKER     Last year per pt.(can't remember date)    Family History  Problem Relation Age of Onset  . Colon polyps Neg Hx   . Colon cancer Neg Hx      Social History   Socioeconomic History  . Marital status: Married    Spouse name: Not on file  . Number of children: Not on file  . Years of education: Not on file  . Highest education level: Not on file  Occupational History  . Not on file  Social Needs  . Financial resource strain: Not hard at all  . Food insecurity:    Worry: Never true    Inability: Never true  . Transportation needs:    Medical: No    Non-medical: No  Tobacco Use  . Smoking status: Never Smoker  . Smokeless tobacco: Never Used  . Tobacco comment: Never smoked   Substance and Sexual Activity  . Alcohol use: No    Alcohol/week: 0.0 oz  . Drug use: No  . Sexual activity: Not Currently  Lifestyle  . Physical activity:    Days per week: 0 days    Minutes per session: 0 min  . Stress: Only a little  Relationships  . Social connections:    Talks on phone: More than three times a week    Gets together: More than three times a week    Attends religious service: More than 4 times per year    Active member of club or organization: No    Attends meetings of clubs or  organizations: Never    Relationship status: Not on file  Other Topics Concern  . Not on file  Social History Narrative   Married.  Children.  Lives in [place].  Eats meats fruits and vegetables.   Current Outpatient Medications on File Prior to Visit  Medication Sig Dispense Refill  . acetaminophen (TYLENOL) 500 MG tablet Take 1,000 mg by mouth 2 (two) times daily as needed for moderate pain  or headache.    . albuterol (PROVENTIL HFA;VENTOLIN HFA) 108 (90 BASE) MCG/ACT inhaler Inhale 2 puffs into the lungs every 6 (six) hours as needed for shortness of breath.    Marland Kitchen aspirin EC 81 MG tablet Take 81 mg by mouth at bedtime.    . B Complex-C (B-COMPLEX WITH VITAMIN C) tablet Take 1 tablet by mouth daily.    . busPIRone (BUSPAR) 5 MG tablet Take 5 mg by mouth 2 (two) times daily.    . Calcium Carbonate-Vitamin D (CALCIUM 600+D PO) Take 1 tablet by mouth 2 (two) times daily.    . chlorthalidone (HYGROTON) 25 MG tablet Take 25 mg by mouth daily.    . fluticasone (FLONASE) 50 MCG/ACT nasal spray Place 1 spray into both nostrils at bedtime.     . lidocaine (XYLOCAINE) 2 % solution TAKE 2 TEASPOONFULS BEFORE MEALS AND AT BEDTIME AS NEEDED MAY REPEAT EVERY 4 HOURS. (MAX OF 8 DOSES PER DAY) (Patient taking differently: TAKE 2 TEASPOONFULS BEFORE MEALS AND AT BEDTIME AS NEEDED FOR STOMACH PAIN MAY REPEAT EVERY 4 HOURS. (MAX OF 8 DOSES PER DAY)) 300 mL 0  . losartan (COZAAR) 100 MG tablet Take 100 mg by mouth daily.    . metoprolol succinate (TOPROL-XL) 50 MG 24 hr tablet Take 50 mg by mouth daily.     . montelukast (SINGULAIR) 10 MG tablet Take 10 mg by mouth daily.     . nabumetone (RELAFEN) 750 MG tablet     . nitroGLYCERIN (NITROSTAT) 0.4 MG SL tablet Place 0.4 mg under the tongue every 5 (five) minutes as needed for chest pain.     . pantoprazole (PROTONIX) 40 MG tablet 1 po 30 mins prior to first and last meal (Patient taking differently: Take 40 mg by mouth daily. ) 60 tablet 11  . polyethylene  glycol (MIRALAX / GLYCOLAX) packet Take 17 g by mouth daily as needed for mild constipation or moderate constipation.     . potassium chloride (K-DUR,KLOR-CON) 10 MEQ tablet Take 20 mEq by mouth 2 (two) times daily.     . Probiotic Product (PROBIOTIC PO) Take 1 capsule by mouth daily. [employer].    . sertraline (ZOLOFT) 100 MG tablet Take 200 mg by mouth daily.     . Simethicone (GAS-X PO) Take 2 tablets by mouth daily as needed (gas).    . simvastatin (ZOCOR) 40 MG tablet Take 40 mg by mouth at bedtime.      . vitamin C (ASCORBIC ACID) 500 MG tablet Take 500 mg by mouth daily.    Marland Kitchen warfarin (COUMADIN) 5 MG tablet Take 2.5-5 mg by mouth See admin instructions. Alternate take 1/2 tab (2.5mg ) with 1 tab (5mg ) every other day (2.5,5,2.5,5,...)     No current facility-administered medications on file prior to visit.     Review of Systems  Constitutional: Negative for activity change, appetite change and fever.  HENT: Negative for trouble swallowing and voice change.   Eyes: Negative for visual disturbance.  Respiratory: Negative for cough, chest tightness and shortness of breath.   Cardiovascular: Negative for chest pain, palpitations and leg swelling.  Gastrointestinal: Negative for abdominal distention, abdominal pain, constipation, nausea and vomiting.  Endocrine: Negative for polyphagia and polyuria.  Genitourinary: Negative for difficulty urinating, dysuria, frequency, hematuria and urgency.  Musculoskeletal: Positive for back pain. Negative for arthralgias, gait problem, joint swelling, neck pain and neck stiffness.  Neurological: Negative for dizziness, tremors, seizures, syncope, facial asymmetry, speech difficulty, weakness, light-headedness, numbness  and headaches.  Hematological: Negative for adenopathy. Does not bruise/bleed easily.  Psychiatric/Behavioral: Negative for behavioral problems, confusion, decreased concentration and dysphoric mood. The patient is not  nervous/anxious.      Objective:   BP (!) 160/82   Pulse 84   Resp 16   Ht 5\' 5"  (1.651 m)   Wt 186 lb 6.4 oz (84.6 kg)   SpO2 96%   BMI 31.02 kg/m   Physical Exam  Constitutional: She is oriented to person, place, and time. She appears well-developed and well-nourished. No distress.  HENT:  Head: Normocephalic and atraumatic.  Eyes: Pupils are equal, round, and reactive to light.  Neck: Normal range of motion. Neck supple. No thyromegaly present.  Cardiovascular: Normal rate, regular rhythm and normal heart sounds.  Pulmonary/Chest: Effort normal and breath sounds normal. No respiratory distress.  Abdominal: Soft. Bowel sounds are normal.  Musculoskeletal:       Thoracic back: She exhibits tenderness. She exhibits normal range of motion, no bony tenderness, no swelling and no edema.  Thoracic/scapular/rhomboid muscle tenderness to palpation.  No palpable knots or lesions.  Strength 5 out of 5 throughout.  Neurological: She is alert and oriented to person, place, and time. No cranial nerve deficit.  Skin: Skin is warm and dry.  Psychiatric: She has a normal mood and affect. Her behavior is normal. Judgment and thought content normal.  Nursing note and vitals reviewed.    Assessment and Plan  1. Chronic bilateral thoracic back pain At this time will give patient a shot of Toradol today.  Pain is her main complaint and she is somewhat dismayed that she is still in pain after going to the emergency department and having multiple tests.  I did discuss the test results with her and reassurance was given regarding the necessity of the test in relation to her history.Patient counseled in detail regarding the risks of medication. Told to call or return to clinic if develop any worrisome signs or symptoms. Patient voiced understanding.  Discussed risks of cardiovascular thrombotic events related to NSAIDS. Discussed increased risk of AMI and CVA. Discussed risk of serious GI adverse  events including bleeding, ulcers, and perforation. Patient understands risks of this medication.  She is agreeable to the dose of Toradol today.  However due to her Coumadin and GI risk I do not recommend she be on chronic NSAIDs/ibuprofen over-the-counter.  She voiced understanding.  She was told she could use Tylenol over-the-counter as needed. - Ambulatory referral to Orthopedic Surgery - ketorolac (TORADOL) injection 60 mg - cyclobenzaprine (FLEXERIL) 10 MG tablet; Take 1 tablet (10 mg total) by mouth 3 (three) times daily as needed for muscle spasms.  Dispense: 30 tablet; Refill: 0 Sedation precautions with Flexeril given.  Patient was counseled regarding worrisome signs and symptoms of back pain and if those develop to go to the emergency department. 2. Essential hypertension Will add back Norvasc 5 mg p.o. daily.  She is to follow-up with cardiology next month.  She is also going to call cardiology to discuss her INR.  I will also have IV call and let them know they need to contact her regarding her low INR.  Patient is to call with any questions or concerns. - amLODipine (NORVASC) 5 MG tablet; Take 1 tablet (5 mg total) by mouth daily.  Dispense: 90 tablet; Refill: 3   Emergency department labs, imaging studies, EKG, all reviewed with patient today. Patient reports that she has had a pneumonia vaccinations.  Will not request  records at this time due to the fact that patient is going to follow-up with a new PCP.  She would like to follow-up in 1 month for blood pressure check then she will establish with a new primary care physician. Return in about 1 month (around 12/12/2017). Caren Macadam, MD 11/14/2017

## 2017-11-14 NOTE — Patient Instructions (Addendum)
Start amlodipine 5 mg a day-called into your pharmacy You may use tylenol 1000 mg, 2-3 times a day for back/muscle pain I have called in the flexeril/muscle relaxer for back. It can make you sleepy, so be careful and take at night Follow up with orthopedics about your back. Referral placed.   Call Dr. Renard Hamper about your INR/PT, levels were low at the ED.  Follow up in 1 month for BP.  Do not take ibuprofen due to risk of bleeding/stomach problems  Heating pad, 20 minutes, 4 times a day

## 2017-11-30 ENCOUNTER — Other Ambulatory Visit: Payer: Self-pay | Admitting: Family Medicine

## 2017-12-06 ENCOUNTER — Ambulatory Visit (INDEPENDENT_AMBULATORY_CARE_PROVIDER_SITE_OTHER): Payer: Medicare Other | Admitting: Gastroenterology

## 2017-12-06 ENCOUNTER — Encounter: Payer: Self-pay | Admitting: Gastroenterology

## 2017-12-06 ENCOUNTER — Encounter

## 2017-12-06 DIAGNOSIS — K219 Gastro-esophageal reflux disease without esophagitis: Secondary | ICD-10-CM

## 2017-12-06 DIAGNOSIS — K760 Fatty (change of) liver, not elsewhere classified: Secondary | ICD-10-CM

## 2017-12-06 DIAGNOSIS — D508 Other iron deficiency anemias: Secondary | ICD-10-CM

## 2017-12-06 DIAGNOSIS — M533 Sacrococcygeal disorders, not elsewhere classified: Secondary | ICD-10-CM

## 2017-12-06 DIAGNOSIS — F411 Generalized anxiety disorder: Secondary | ICD-10-CM | POA: Diagnosis not present

## 2017-12-06 DIAGNOSIS — K30 Functional dyspepsia: Secondary | ICD-10-CM

## 2017-12-06 NOTE — Progress Notes (Signed)
Subjective:    Patient ID: Courtney Rogers, female    DOB: Sep 17, 1950, 67 y.o.   MRN: [id number] Caren Macadam, MD   HPI HAD SHARP PAIN IN SHOULDER BLADES AND DIDN'T LIKE MUSCLE RELAXER SHE WAS GIVEN IN ED. OCCASIONAL UPPER ABDOMINAL PAIN RELIEVED WITH VISCOUS LIDOCAINE(2-3 DAYS INA ROW THEN NO MORE FOR A MONTH OR SO). RARE HEARTBURN: < 1-2X/WEEK. BMs: JUST ABOUT EVERY DAY. STILL TAKING CARE OF HUSBAND. HE WALKS WITH IS WALKER. HE HAS KIDNEY CANCER/ON ABX OVER THE PAST YEAR. TAKING B12 DOTS. LAST Hb JUN 2019 NL.   PT DENIES FEVER, CHILLS, HEMATOCHEZIA, HEMATEMESIS, nausea, vomiting, melena, diarrhea, CHEST PAIN, SHORTNESS OF BREATH, CHANGE IN BOWEL IN HABITS, constipation, problems swallowing, OR heartburn or indigestion.  Past Medical History:  Diagnosis Date  . Acute myocardial infarction [facility]) 2009   CAD/no stent medically managed  . Anxiety disorder   . CAD (coronary artery disease)   . Hyperlipidemia   . Hypertension   . IBS (irritable bowel syndrome)   . Overactive bladder   . PE (pulmonary embolism) JUN 2014  . Presence of permanent cardiac pacemaker   . Sleep apnea    Past Surgical History:  Procedure Laterality Date  . ABDOMINAL HYSTERECTOMY    . BSO secondary to cyst    . CARDIAC CATHETERIZATION    . COLONOSCOPY  12/2009   Dr. West Carbo, propofol, normal. Next TCS 12/2019  . COLONOSCOPY N/A 05/22/2017   Procedure: COLONOSCOPY;  Surgeon: Danie Binder, MD;  [facility];  Service: Endoscopy;  Laterality: N/A;  10:30am  . ESOPHAGOGASTRODUODENOSCOPY  05/11/09   schatzki ring/small hiatal hernia/path:gastritis  . ESOPHAGOGASTRODUODENOSCOPY N/A 05/22/2017   Procedure: ESOPHAGOGASTRODUODENOSCOPY (EGD);  Surgeon: Danie Binder, MD;  [facility];  Service: Endoscopy;  Laterality: N/A;  . ESOPHAGOGASTRODUODENOSCOPY (EGD) WITH ESOPHAGEAL DILATION N/A 04/01/2013   [id number] at the gastroesophageal juction/multiple small polyps/mild gastritis    . GIVENS CAPSULE STUDY N/A 06/07/2017   Procedure: GIVENS CAPSULE STUDY;  Surgeon: Danie Binder, MD;  [facility];  Service: Endoscopy;  Laterality: N/A;  7:30am  . INSERT / REPLACE / REMOVE PACEMAKER     Last year per pt.(can't remember date)    Allergies  Allergen Reactions  . Biaxin [Clarithromycin] Other (See Comments)    Stomach problems   . Lisinopril Swelling    Current Outpatient Medications  Medication Sig    . acetaminophen (TYLENOL) 500 MG tablet Take 1,000 mg by mouth 2 (two) times daily as needed for moderate pain or headache.    . albuterol (PROVENTIL HFA;VENTOLIN HFA) 108 (90 BASE) MCG/ACT inhaler Inhale 2 puffs into the lungs every 6 (six) hours as needed for shortness of breath.    Marland Kitchen amLODipine (NORVASC) 5 MG tablet Take 1 tablet (5 mg total) by mouth daily.    Marland Kitchen aspirin EC 81 MG tablet Take 81 mg by mouth at bedtime.    . B Complex-C (B-COMPLEX WITH VITAMIN C) tablet Take 1 tablet by mouth daily.    . busPIRone (BUSPAR) 5 MG tablet Take 5 mg by mouth 2 (two) times daily.    . Calcium Carbonate-Vitamin D (CALCIUM 600+D PO) Take 1 tablet by mouth 2 (two) times daily.    . chlorthalidone (HYGROTON) 25 MG tablet Take 12.5 mg by mouth daily.     . fluticasone (FLONASE) 50 MCG/ACT nasal spray SPRAY 2 SPRAYS INTO EACH NOSTRIL ONCE DAILY    . lidocaine (XYLOCAINE) 2 % solution TAKE 2  TEASPOONFULS BEFORE MEALS AND AT BEDTIME AS NEEDED FOR STOMACH PAIN MAY REPEAT EVERY 4 HOURS. (MAX OF 8 DOSES PER DAY))    . losartan (COZAAR) 100 MG tablet Take 100 mg by mouth daily.    . metoprolol succinate (TOPROL-XL) 50 MG 24 hr tablet Take 75 mg by mouth daily.     . montelukast (SINGULAIR) 10 MG tablet Take 10 mg by mouth daily.     . nitroGLYCERIN (NITROSTAT) 0.4 MG SL tablet Place 0.4 mg under the tongue every 5 (five) minutes as needed for chest pain.     . pantoprazole (PROTONIX) 40 MG tablet 1 po 30 mins prior to first and last meal (Patient taking differently: Take 40  mg by mouth daily. )    . polyethylene glycol (MIRALAX / GLYCOLAX) packet Take 17 g by mouth daily as needed for mild constipation or moderate constipation.     . potassium chloride (K-DUR,KLOR-CON) 10 MEQ tablet Take 20 mEq by mouth 2 (two) times daily.     . Probiotic Product (PROBIOTIC PO) Take 1 capsule by mouth daily. [employer].    . sertraline (ZOLOFT) 100 MG tablet Take 200 mg by mouth daily.     . Simethicone (GAS-X PO) Take 2 tablets by mouth daily as needed (gas). RARE   . simvastatin (ZOCOR) 40 MG tablet Take 40 mg by mouth at bedtime.      . vitamin C (ASCORBIC ACID) 500 MG tablet Take 500 mg by mouth daily.    .            . warfarin (COUMADIN) 5 MG tablet Take 2.5-5 mg by mouth See admin instructions. Alternate take 1/2 tab (2.5mg ) with 1 tab (5mg ) every other day (2.5,5,2.5,5,...)     Review of Systems PER HPI OTHERWISE ALL SYSTEMS ARE NEGATIVE.    Objective:   Physical Exam  Constitutional: She is oriented to person, place, and time. She appears well-developed and well-nourished. No distress.  HENT:  Head: Normocephalic and atraumatic.  Mouth/Throat: Oropharynx is clear and moist. No oropharyngeal exudate.  Eyes: Pupils are equal, round, and reactive to light. No scleral icterus.  Neck: Normal range of motion. Neck supple.  Cardiovascular: Normal rate, regular rhythm and normal heart sounds.  Pulmonary/Chest: Effort normal and breath sounds normal.  Abdominal: Soft. Bowel sounds are normal. She exhibits no distension. There is no tenderness.  Musculoskeletal: She exhibits no edema.  Lymphadenopathy:    She has no cervical adenopathy.  Neurological: She is alert and oriented to person, place, and time.  NO FOCAL DEFICITS  Psychiatric: She has a normal mood and affect.  Vitals reviewed.     Assessment & Plan:

## 2017-12-06 NOTE — Assessment & Plan Note (Signed)
SYMPTOMS FAIRLY WELL CONTROLLED AND ANXIETY BETTER WELL CONTROLLED.  CONTINUE TO MONITOR SYMPTOMS. REMEMBER: EVERY 1-2 HOURS, STOP, SIT DOWN, AND TAKE 10 DEEP SLOW BREATHS IN AND OUT.  AVOID FOOD AND DRINKS THAT CAUSE BLOATING & GAS.  HANDOUT GIVEN. Add TURMERIC PILLS DAILY. CONTINUE B12 DOTS. CONTINUE BUSPAR. PLEASE CALL WITH QUESTIONS OR CONCERNS.  FOLLOW UP IN 6 MOS.

## 2017-12-06 NOTE — Assessment & Plan Note (Signed)
SYMPTOMS FAIRLY WELL CONTROLLED.  VISCOUS LIDOCAINE PRN. FOLLOW UP IN 6 MOS.

## 2017-12-06 NOTE — Assessment & Plan Note (Addendum)
SYMPTOMS FAIRLY WELL CONTROLLED.  CONTINUE PROTONIX. TAKE 30 MINUTES PRIOR TO MEALS ONCE OR TWICE DAILY. FOLLOW UP IN 6 MOS.  

## 2017-12-06 NOTE — Assessment & Plan Note (Signed)
WEIGHT DOWN 4 LBS.  CONTINUE YOUR WEIGHT LOSS EFFORTS. HFP ONCE A YEAR. FOLLOW UP IN 6 MOS.

## 2017-12-06 NOTE — Assessment & Plan Note (Signed)
SYMPTOMS CONTROLLED/RESOLVED.  CONTINUE TO MONITOR SYMPTOMS. 

## 2017-12-06 NOTE — Patient Instructions (Addendum)
REMEMBER: EVERY 1-2 HOURS, STOP, SIT DOWN, AND TAKE 10 DEEP SLOW BREATHS IN AND OUT.   EAT FOODS HIGH IN IRON.  AVOID FOOD AND DRINKS THAT CAUSE BLOATING & GAS.   Add TURMERIC PILLS DAILY.  CONTINUE B12 DOTS.    CONTINUE BUSPAR.  CONTINUE PROTONIX. TAKE 30 MINUTES PRIOR TO MEALS ONCE OR TWICE DAILY.   PLEASE CALL WITH QUESTIONS OR CONCERNS.  FOLLOW UP IN 6 MOS.   BLOATING AND GAS PREVENTION  Although gas may be uncomfortable and embarrassing, it is not life-threatening. Understanding causes, ways to reduce symptoms, and treatment will help most people find some relief.  Points to remember . Everyone has gas in the digestive tract. Marland Kitchen People often believe normal passage of gas to be excessive. . Gas comes from two main sources: swallowed air and normal breakdown of certain foods by harmless bacteria naturally present in the large intestine. .  . Many foods with carbohydrates can cause gas. Fats and proteins cause little gas. .  . Foods that may cause gas include o beans  o vegetables, such as broccoli, cabbage, brussels sprouts, onions, artichokes, and asparagus  o fruits, such as pears, apples, and peaches  o whole grains, such as whole wheat and bran  o soft drinks and fruit drinks  o milk and milk products, such as cheese and ice cream, and packaged foods prepared with lactose, such as bread, cereal, and salad dressing  o foods containing sorbitol, such as dietetic foods and sugar free candies and gums   . The most common symptoms of gas are belching, flatulence, bloating, and abdominal pain. However, some of these symptoms are often caused by an intestinal disorder, such as irritable bowel syndrome, rather than too much gas. . The most common ways to reduce the discomfort of gas are changing diet, taking nonprescription medicines, and reducing the amount of air swallowed. . Digestive enzymes, such as lactase supplements, actually help digest carbohydrates and may allow  people to eat foods that normally cause gas.   High Iron Diet  Purpose Iron is a mineral essential for life. Found in red blood cells, iron's primary role is to carry oxygen from the lungs to the rest of the body. Without oxygen, the body's cells cannot function normally. If the body's iron stores become too low, an iron-deficiency anemia can occur. This is characterized by weakness, lethargy, muscle fatigue, and shortness of breath. In severe cases, a person's skin may become pale due to a lack of red blood cells in the body. In adults, iron deficiency is most commonly caused by chronic blood loss, such as with heavy menstruation or intestinal bleeding from peptic ulcers, cancer, or hemorrhoids. In children, iron deficiency is usually the result of an inadequate iron intake.  Nutrition Facts The recommended dietary allowance (RDA) for iron in healthy adults is 10 milligrams per day for men and 15 milligrams per day for premenopausal women.   The following table lists foods high in iron. In general, meat, fish, and poultry are excellent sources. Other sources of iron include beans, dried fruits, whole grains, fortified cereals, and enriched breads.   Special Considerations  1. Heme and nonhemd iron are two forms of iron in foods. Heme iron is found in meats, poultry, and fish. NonHeme iron is found in both plant and animal foods. Heme iron is more easily absorbed by the body than nonheme iron. However, heme iron can also promote the absorption of non-heme iron. Therefore, eating beef and beans, for  example, is good for providing adequate absorption of both types of iron. 2. Vitamin C also promotes iron absorption. This is true for both heme and nonheme iron. It is, therefore, beneficial to consume citrus fruits or juices, which are high in vitamin C, with foods that contain iron. For example, a meal might include a lean sirloin steak (heme iron source), baked potato (nonheme iron source , broccoli  (nonheme iroj source), and an orange (vitamin C source) for a good iron intake. 3. Phytic and tannic aids are two food components that, when consumed in large amounts, prevent the abrorption of iron. Phytic acid is found in rye bread and other foods made from whole grains. Phytic acid is also found in nonherbal teas. Tannic acid is found in commercial black and pekoe teas, coffee, cola drinks, chocolate, and red wines. 4. Iron SupplementsThere are many different kinds of iron supplements. However, iron supplemants should only be taken when there is a true deficiency of iron and only under medical supervision. General multivitamins often have iron and other minerals added to them in moderate amounts. If otherwise healthy, this amount of iron is probably not harmful. If iron is to be avoided, multivitamins containing iron should not be used.Please note that it is important to keep iron and multivita-in supplements safely away from a child's reach. If ingested, severe poisoning can occur.   Foods That Contain Iron  Food Serving Size (mg)  Bran flakes cereal 1 cup 24.0  Product 19 cereal 1 cup 24.0  Clams, steamed 3 oz 23.8  Total cereal 1 cup 18.0  Life cereal 1 cup 12.2  Raisin bran cereal 1 cup 9.3  Beef liver, braised 3 oz 5.8  Kix cereal 1 cup 5.4  Cheerios cereal 1 cup 3.6  Prune juice 1 cup 3.0  Potato, baked with skin 1 med 2.8  Sirloin steak, cooked 3 oz 2.8  Shrimp, cooked 3 oz 2.6  Navy beans, cooked 1/2 cup 2.3  Figs, dried 5 2.1  Lean ground beef, broiled 3 oz 2.1  Swiss chard, cooked 1/2 cup 2.0  Rice krispies cereal 1 cup 1.8  Kidney beans 1/2 cup 1.6  Oatmeal, cooked 1/2 cup 1.6  Spinach, raw 1 cup 1.5  Tuna, canned in water 3 oz 1.3  Green peas, conked 1/2 cup 1.2  Halibut, cooked 3 oz 0.9  Whole-wheat bread 1 slice 0.9  Apricot halves, dried 5 0.8  Raisins 1/4 cup 0.8  Broccoli, cooked 1/2 cup 0.6  Egg, boiled 1 large 0.6

## 2017-12-06 NOTE — Assessment & Plan Note (Signed)
OBSCURE GI BLEED/FEDA. NO BRBPR OR MELENA.  REMEMBER: EVERY 1-2 HOURS, STOP, SIT DOWN, AND TAKE 10 DEEP SLOW BREATHS IN AND OUT. EAT FOODS HIGH IN IRON. PLEASE CALL WITH QUESTIONS OR CONCERNS.  FOLLOW UP IN 6 MOS.

## 2017-12-06 NOTE — Progress Notes (Signed)
cc'ed to pcp °

## 2017-12-11 ENCOUNTER — Other Ambulatory Visit: Payer: Self-pay | Admitting: Gastroenterology

## 2017-12-13 ENCOUNTER — Other Ambulatory Visit: Payer: Self-pay

## 2017-12-14 ENCOUNTER — Ambulatory Visit (INDEPENDENT_AMBULATORY_CARE_PROVIDER_SITE_OTHER): Payer: Medicare Other | Admitting: Family Medicine

## 2017-12-14 ENCOUNTER — Encounter: Payer: Self-pay | Admitting: Family Medicine

## 2017-12-14 ENCOUNTER — Other Ambulatory Visit: Payer: Self-pay

## 2017-12-14 VITALS — BP 120/72 | HR 68 | Temp 98.0°F | Resp 14 | Ht 65.0 in | Wt 186.1 lb

## 2017-12-14 DIAGNOSIS — I10 Essential (primary) hypertension: Secondary | ICD-10-CM | POA: Diagnosis not present

## 2017-12-14 DIAGNOSIS — Z23 Encounter for immunization: Secondary | ICD-10-CM

## 2017-12-14 MED ORDER — AMLODIPINE BESYLATE 5 MG PO TABS: 5.0000 mg | ORAL_TABLET | Freq: Every day | ORAL | 0 refills | Status: DC

## 2017-12-14 NOTE — Progress Notes (Signed)
Patient ID: Courtney Rogers, female    DOB: 11-Dec-1950, 67 y.o.   MRN: [id number]  Chief Complaint  Patient presents with  . Hypertension    Allergies Biaxin [clarithromycin] and Lisinopril  Subjective:   Courtney Rogers is a 67 y.o. female who presents to [facility] today.  HPI Here for follow up of BP. Has been on the norvasc for several weeks and BP has been running better.  At last visit she was started on low-dose of Norvasc since her blood pressure was in the 350 systolic.  She had been unable to tolerate increases in the dose of chlorthalidone due to dizzy symptoms in the past.  She reports that she is felt well.  No headaches.  Denies any chest pain, shortness of breath, or swelling in her extremities.  Is followed by cardiology secondary to history of acute myocardial infarction and is status post pacemaker placement.  She has an upcoming appointment with cardiology in the next couple months.  Her INR is followed and her recent INR this week was 2.3.  She continues to take her Coumadin each day.  She is followed by behavioral health.  She has not had any swelling in her legs since since starting the amlodipine.  She denies any side effects with her cholesterol medication.   Past Medical History:  Diagnosis Date  . Acute myocardial infarction [facility]) 2009   CAD/no stent medically managed  . Anxiety disorder   . CAD (coronary artery disease)   . Hyperlipidemia   . Hypertension   . IBS (irritable bowel syndrome)   . Overactive bladder   . PE (pulmonary embolism) JUN 2014  . Presence of permanent cardiac pacemaker   . Sleep apnea     Past Surgical History:  Procedure Laterality Date  . ABDOMINAL HYSTERECTOMY    . BSO secondary to cyst    . CARDIAC CATHETERIZATION    . COLONOSCOPY  12/2009   Dr. West Carbo, propofol, normal. Next TCS 12/2019  . COLONOSCOPY N/A 05/22/2017   Procedure: COLONOSCOPY;  Surgeon: Danie Binder, MD;  Location: [facility];   Service: Endoscopy;  Laterality: N/A;  10:30am  . ESOPHAGOGASTRODUODENOSCOPY  05/11/09   schatzki ring/small hiatal hernia/path:gastritis  . ESOPHAGOGASTRODUODENOSCOPY N/A 05/22/2017   Procedure: ESOPHAGOGASTRODUODENOSCOPY (EGD);  Surgeon: Danie Binder, MD;  Location: [facility];  Service: Endoscopy;  Laterality: N/A;  . ESOPHAGOGASTRODUODENOSCOPY (EGD) WITH ESOPHAGEAL DILATION N/A 04/01/2013   [id number] at the gastroesophageal juction/multiple small polyps/mild gastritis  . GIVENS CAPSULE STUDY N/A 06/07/2017   Procedure: GIVENS CAPSULE STUDY;  Surgeon: Danie Binder, MD;  Location: [facility];  Service: Endoscopy;  Laterality: N/A;  7:30am  . INSERT / REPLACE / REMOVE PACEMAKER     Last year per pt.(can't remember date)    Family History  Problem Relation Age of Onset  . Colon polyps Neg Hx   . Colon cancer Neg Hx      Social History   Socioeconomic History  . Marital status: Married    Spouse name: Not on file  . Number of children: Not on file  . Years of education: Not on file  . Highest education level: Not on file  Occupational History  . Not on file  Social Needs  . Financial resource strain: Not hard at all  . Food insecurity:    Worry: Never true    Inability: Never true  . Transportation needs:    Medical: No  Non-medical: No  Tobacco Use  . Smoking status: Never Smoker  . Smokeless tobacco: Never Used  . Tobacco comment: Never smoked   Substance and Sexual Activity  . Alcohol use: No    Alcohol/week: 0.0 oz  . Drug use: No  . Sexual activity: Not Currently  Lifestyle  . Physical activity:    Days per week: 0 days    Minutes per session: 0 min  . Stress: Only a little  Relationships  . Social connections:    Talks on phone: More than three times a week    Gets together: More than three times a week    Attends religious service: More than 4 times per year    Active member of club or organization: No    Attends meetings of clubs or  organizations: Never    Relationship status: Not on file  Other Topics Concern  . Not on file  Social History Narrative   Married.  Children.  Lives in [place].  Eats meats fruits and vegetables.   Current Outpatient Medications on File Prior to Visit  Medication Sig Dispense Refill  . acetaminophen (TYLENOL) 500 MG tablet Take 1,000 mg by mouth 2 (two) times daily as needed for moderate pain or headache.    . albuterol (PROVENTIL HFA;VENTOLIN HFA) 108 (90 BASE) MCG/ACT inhaler Inhale 2 puffs into the lungs every 6 (six) hours as needed for shortness of breath.    Marland Kitchen aspirin EC 81 MG tablet Take 81 mg by mouth at bedtime.    . B Complex-C (B-COMPLEX WITH VITAMIN C) tablet Take 1 tablet by mouth daily. Under the tongue    . busPIRone (BUSPAR) 5 MG tablet Take 5 mg by mouth 2 (two) times daily.    . Calcium Carbonate-Vitamin D (CALCIUM 600+D PO) Take 1 tablet by mouth 2 (two) times daily.    . chlorthalidone (HYGROTON) 25 MG tablet Take 12.5 mg by mouth daily.     . fluticasone (FLONASE) 50 MCG/ACT nasal spray SPRAY 2 SPRAYS INTO EACH NOSTRIL ONCE DAILY 16 g 0  . lidocaine (XYLOCAINE) 2 % solution TAKE 2 TEASPOONFULS BEFORE MEALS AND AT BEDTIME AS NEEDED MAY REPEAT EVERY 4 HOURS. (MAX OF 8 DOSES PER DAY) 300 mL 0  . losartan (COZAAR) 100 MG tablet Take 100 mg by mouth daily.    . metoprolol succinate (TOPROL-XL) 50 MG 24 hr tablet Take 75 mg by mouth daily.     . montelukast (SINGULAIR) 10 MG tablet Take 10 mg by mouth daily.     . nitroGLYCERIN (NITROSTAT) 0.4 MG SL tablet Place 0.4 mg under the tongue every 5 (five) minutes as needed for chest pain.     . pantoprazole (PROTONIX) 40 MG tablet 1 po 30 mins prior to first and last meal (Patient taking differently: Take 40 mg by mouth daily. ) 60 tablet 11  . polyethylene glycol (MIRALAX / GLYCOLAX) packet Take 17 g by mouth daily as needed for mild constipation or moderate constipation.     . potassium chloride (K-DUR,KLOR-CON) 10 MEQ tablet  Take 20 mEq by mouth 2 (two) times daily.     . Probiotic Product (PROBIOTIC PO) Take 1 capsule by mouth daily. [facility].    . sertraline (ZOLOFT) 100 MG tablet Take 200 mg by mouth daily.     . Simethicone (GAS-X PO) Take 2 tablets by mouth daily as needed (gas).    . simvastatin (ZOCOR) 40 MG tablet Take 40 mg by mouth at bedtime.      Marland Kitchen  vitamin C (ASCORBIC ACID) 500 MG tablet Take 500 mg by mouth daily.    Marland Kitchen warfarin (COUMADIN) 5 MG tablet Take 2.5-5 mg by mouth See admin instructions. Alternate take 1/2 tab (2.5mg ) with 1 tab (5mg ) every other day (2.5,5,2.5,5,...)     No current facility-administered medications on file prior to visit.     Review of Systems  Constitutional: Negative for activity change, appetite change, fever and unexpected weight change.  Eyes: Negative for visual disturbance.  Respiratory: Negative for cough, chest tightness and shortness of breath.   Cardiovascular: Negative for chest pain and leg swelling.  Gastrointestinal: Negative for abdominal pain, nausea and vomiting.  Genitourinary: Negative for dysuria, frequency and urgency.  Neurological: Negative for dizziness, syncope and light-headedness.  Hematological: Negative for adenopathy.     Objective:   BP 120/72 (BP Location: Left Arm, Patient Position: Sitting, Cuff Size: Large)   Pulse 68   Temp 98 F (36.7 C) (Temporal)   Resp 14   Ht 5\' 5"  (1.651 m)   Wt 186 lb 1.9 oz (84.4 kg)   SpO2 95%   BMI 30.97 kg/m   Physical Exam  Constitutional: She is oriented to person, place, and time. She appears well-developed and well-nourished. No distress.  HENT:  Head: Normocephalic and atraumatic.  Mouth/Throat: Oropharynx is clear and moist.  Eyes: Pupils are equal, round, and reactive to light. Conjunctivae are normal. No scleral icterus.  Neck: Normal range of motion. Neck supple. No thyromegaly present.  Cardiovascular: Normal rate, regular rhythm and normal heart sounds.    Pulmonary/Chest: Effort normal and breath sounds normal. No respiratory distress.  Abdominal: Soft. Bowel sounds are normal.  Neurological: She is alert and oriented to person, place, and time. No cranial nerve deficit.  Skin: Skin is warm and dry.  Psychiatric: She has a normal mood and affect. Her behavior is normal.  Nursing note and vitals reviewed.    Assessment and Plan  1. Essential hypertension Continue amlodipine as directed.  Blood pressure goals discussed with patient.  Blood pressure is much improved and she is asymptomatic.  She will keep her scheduled follow-up visits with cardiology. - amLODipine (NORVASC) 5 MG tablet; Take 1 tablet (5 mg total) by mouth daily.  Dispense: 90 tablet; Refill: 0 Lifestyle modifications discussed with patient including a diet emphasizing vegetables, fruits, and whole grains. Limiting intake of sodium to less than 2,400 mg per day.  Recommendations discussed include consuming low-fat dairy products, poultry, fish, legumes, non-tropical vegetable oils, and nuts; and limiting intake of sweets, sugar-sweetened beverages, and red meat. Discussed following a plan such as the Dietary Approaches to Stop Hypertension (DASH) diet. Patient to read up on this diet.   2. Immunization due Patient brings in her immunization record today which reveals her Tdap is up-to-date.  She also had the pneumonia 23 vaccination at age 33.  She is only had one pneumonia shot.  We will give her that vaccination today. - Pneumococcal conjugate vaccine 13-valent IM  She will establish care with new PCP.  She has her routine Pap, pelvic, and breast exam performed by gynecology each year.  Her bone mineral density was done.  She is followed by Dr.Fields for her IBS, reflux, and colon cancer screening.  She agrees to keep her scheduled visits with her specialist.  In addition, she agrees to seek follow-up with new PCP within the next several months.  She also continue to follow-up  with behavioral health.  She will call with any questions  or concerns. No follow-ups on file. Caren Macadam, MD 12/14/2017

## 2017-12-25 ENCOUNTER — Inpatient Hospital Stay (HOSPITAL_COMMUNITY): Payer: Medicare Other | Attending: Hematology

## 2017-12-25 DIAGNOSIS — I10 Essential (primary) hypertension: Secondary | ICD-10-CM | POA: Insufficient documentation

## 2017-12-25 DIAGNOSIS — Z86711 Personal history of pulmonary embolism: Secondary | ICD-10-CM | POA: Insufficient documentation

## 2017-12-25 DIAGNOSIS — Z950 Presence of cardiac pacemaker: Secondary | ICD-10-CM | POA: Insufficient documentation

## 2017-12-25 DIAGNOSIS — D500 Iron deficiency anemia secondary to blood loss (chronic): Secondary | ICD-10-CM

## 2017-12-25 DIAGNOSIS — D509 Iron deficiency anemia, unspecified: Secondary | ICD-10-CM | POA: Insufficient documentation

## 2017-12-25 DIAGNOSIS — R002 Palpitations: Secondary | ICD-10-CM | POA: Diagnosis not present

## 2017-12-25 DIAGNOSIS — Z7901 Long term (current) use of anticoagulants: Secondary | ICD-10-CM | POA: Insufficient documentation

## 2017-12-25 LAB — COMPREHENSIVE METABOLIC PANEL
ALT: 17 U/L (ref 0–44)
AST: 25 U/L (ref 15–41)
Albumin: 3.7 g/dL (ref 3.5–5.0)
Alkaline Phosphatase: 65 U/L (ref 38–126)
Anion gap: 9 (ref 5–15)
BUN: 14 mg/dL (ref 8–23)
CO2: 27 mmol/L (ref 22–32)
Calcium: 9.1 mg/dL (ref 8.9–10.3)
Chloride: 102 mmol/L (ref 98–111)
Creatinine, Ser: 0.85 mg/dL (ref 0.44–1.00)
GFR calc Af Amer: >60 mL/min (ref >60)
GFR calc non Af Amer: >60 mL/min (ref >60)
Glucose, Bld: 89 mg/dL (ref 70–99)
Potassium: 3.5 mmol/L (ref 3.5–5.1)
Sodium: 138 mmol/L (ref 135–145)
Total Bilirubin: 0.8 mg/dL (ref 0.3–1.2)
Total Protein: 9.1 g/dL — ABNORMAL HIGH (ref 6.5–8.1)

## 2017-12-25 LAB — CBC WITH DIFFERENTIAL/PLATELET
Basophils Absolute: 0 10*3/uL (ref 0.0–0.1)
Basophils Relative: 1 %
Eosinophils Absolute: 0.3 10*3/uL (ref 0.0–0.7)
Eosinophils Relative: 5 %
HCT: 36.4 % (ref 36.0–46.0)
Hemoglobin: 12 g/dL (ref 12.0–15.0)
Lymphocytes Relative: 35 %
Lymphs Abs: 2 10*3/uL (ref 0.7–4.0)
MCH: 32 pg (ref 26.0–34.0)
MCHC: 33 g/dL (ref 30.0–36.0)
MCV: 97.1 fL (ref 78.0–100.0)
Monocytes Absolute: 0.4 10*3/uL (ref 0.1–1.0)
Monocytes Relative: 8 %
Neutro Abs: 3 10*3/uL (ref 1.7–7.7)
Neutrophils Relative %: 51 %
Platelets: 162 10*3/uL (ref 150–400)
RBC: 3.75 MIL/uL — ABNORMAL LOW (ref 3.87–5.11)
RDW: 14.2 % (ref 11.5–15.5)
WBC: 5.7 10*3/uL (ref 4.0–10.5)

## 2017-12-25 LAB — LACTATE DEHYDROGENASE: LDH: 191 U/L (ref 98–192)

## 2017-12-25 LAB — FERRITIN: Ferritin: 171 ng/mL (ref 11–307)

## 2018-01-01 ENCOUNTER — Ambulatory Visit (HOSPITAL_COMMUNITY): Payer: BC Managed Care – PPO | Admitting: Internal Medicine

## 2018-01-02 ENCOUNTER — Ambulatory Visit (HOSPITAL_COMMUNITY): Payer: BC Managed Care – PPO | Admitting: Internal Medicine

## 2018-01-02 ENCOUNTER — Inpatient Hospital Stay (HOSPITAL_BASED_OUTPATIENT_CLINIC_OR_DEPARTMENT_OTHER): Payer: Medicare Other | Admitting: Internal Medicine

## 2018-01-02 ENCOUNTER — Encounter (HOSPITAL_COMMUNITY): Payer: Self-pay | Admitting: Internal Medicine

## 2018-01-02 VITALS — BP 140/73 | HR 88 | Temp 98.2°F | Resp 16 | Wt 189.5 lb

## 2018-01-02 DIAGNOSIS — I10 Essential (primary) hypertension: Secondary | ICD-10-CM

## 2018-01-02 DIAGNOSIS — Z86711 Personal history of pulmonary embolism: Secondary | ICD-10-CM

## 2018-01-02 DIAGNOSIS — D509 Iron deficiency anemia, unspecified: Secondary | ICD-10-CM | POA: Diagnosis not present

## 2018-01-02 DIAGNOSIS — D500 Iron deficiency anemia secondary to blood loss (chronic): Secondary | ICD-10-CM

## 2018-01-02 DIAGNOSIS — Z7901 Long term (current) use of anticoagulants: Secondary | ICD-10-CM

## 2018-01-02 DIAGNOSIS — R002 Palpitations: Secondary | ICD-10-CM

## 2018-01-02 DIAGNOSIS — Z950 Presence of cardiac pacemaker: Secondary | ICD-10-CM | POA: Diagnosis not present

## 2018-01-02 NOTE — Patient Instructions (Signed)
[facility] Discharge Instructions  You saw Dr. Higgs today.   Thank you for choosing [facility] to provide your oncology and hematology care.  To afford each patient quality time with our provider, please arrive at least 15 minutes before your scheduled appointment time.   If you have a lab appointment with the Cancer Center please come in thru the  Main Entrance and check in at the main information desk  You need to re-schedule your appointment should you arrive 10 or more minutes late.  We strive to give you quality time with our providers, and arriving late affects you and other patients whose appointments are after yours.  Also, if you no show three or more times for appointments you may be dismissed from the clinic at the providers discretion.     Again, thank you for choosing [facility].  Our hope is that these requests will decrease the amount of time that you wait before being seen by our physicians.       _____________________________________________________________  Should you have questions after your visit to [facility], please contact our office at [phone] between the hours of 8:00 a.m. and 4:30 p.m.  Voicemails left after 4:00 p.m. will not be returned until the following business day.  For prescription refill requests, have your pharmacy contact our office and allow 72 hours.    Cancer Center Support Programs:   > Cancer Support Group  2nd Tuesday of the month 1pm-2pm, Journey Room    

## 2018-01-02 NOTE — Progress Notes (Signed)
Diagnosis Iron deficiency anemia due to chronic blood loss - Plan: CBC with Differential/Platelet, Comprehensive metabolic panel, Lactate dehydrogenase, Ferritin, TSH  Palpitations - Plan: CBC with Differential/Platelet, Comprehensive metabolic panel, Lactate dehydrogenase, Ferritin, TSH  Staging Cancer Staging No matching staging information was found for the patient.  Assessment and Plan:  1.  Iron deficiency anemia.  Patient was initially seen by Dr. Sherrine Maples.  Pt was treated with 1 dose of Injectafer on 08/07/2017.   Labs done 12/25/2017 reviewed and shows WBC 5.7 HB 12 plts 162,000. Chemistries WNL with K+ 3.5, Cr 0.85 LFTs WNL.  She will RTC in 04/2018 for follow-up and repeat labs.  Follow-up with Dr. Oneida Alar of GI as recommended.    2.  Palpitations.  Pt is followed by cardiology and has an upcoming appointment.  She has pacemaker in place.  I have discussed with her to speak with Cardiology regarding symptoms.  Will check TSH on RTC.    3.  PE.  Patient remains on  Coumadin with INR monitoring through PCP.    4.  HTN.  BP is 140/73.  Follow-up with PCP.    5.  Health maintenance.  GI and mammogram screening as recommended.   Interval History:   Historical data obtained from the note dated 08/21/2017.  67 year old lady who is referred by Dr. Barney Drain for lower Gi bleed noted in 2018. Patient also has a long standing history of dyspepsia. She was found to have a decline in hgb gradually since 11/2016 from about 10 to 9 recently. Ferritin was noted to be low confirming Iron deficiency. Patient underwent extensive GI work up to identify source of bleed, without any significant source   Current Status: She is seen today for follow-up.  She is to go over labs.  She reports occasional palpitations.    Problem List Patient Active Problem List   Diagnosis Date Noted  . Essential hypertension [I10] 12/14/2017  . Iron deficiency anemia [D50.9] 08/03/2017  . Dyspepsia [R10.13]   .  Coccydynia [M53.3] 04/19/2017  . Normocytic anemia [D64.9] 01/12/2017  . Fatty liver [K76.0] 11/03/2015  . Anxiety state [F41.1] 11/03/2015  . Weight loss [R63.4] 04/05/2011  . GERD [K21.9] 06/01/2010  . IRRITABLE BOWEL SYNDROME [K58.9] 07/21/2009  . Non-ulcer dyspepsia [K30] 04/30/2009    Past Medical History Past Medical History:  Diagnosis Date  . Acute myocardial infarction [facility]) 2009   CAD/no stent medically managed  . Anxiety disorder   . CAD (coronary artery disease)   . Hyperlipidemia   . Hypertension   . IBS (irritable bowel syndrome)   . Overactive bladder   . PE (pulmonary embolism) JUN 2014  . Presence of permanent cardiac pacemaker   . Sleep apnea     Past Surgical History Past Surgical History:  Procedure Laterality Date  . ABDOMINAL HYSTERECTOMY    . BSO secondary to cyst    . CARDIAC CATHETERIZATION    . COLONOSCOPY  12/2009   Dr. West Carbo, propofol, normal. Next TCS 12/2019  . COLONOSCOPY N/A 05/22/2017   Procedure: COLONOSCOPY;  Surgeon: Danie Binder, MD;  Location: [facility];  Service: Endoscopy;  Laterality: N/A;  10:30am  . ESOPHAGOGASTRODUODENOSCOPY  05/11/09   schatzki ring/small hiatal hernia/path:gastritis  . ESOPHAGOGASTRODUODENOSCOPY N/A 05/22/2017   Procedure: ESOPHAGOGASTRODUODENOSCOPY (EGD);  Surgeon: Danie Binder, MD;  Location: [facility];  Service: Endoscopy;  Laterality: N/A;  . ESOPHAGOGASTRODUODENOSCOPY (EGD) WITH ESOPHAGEAL DILATION N/A 04/01/2013   YBW:LSLHTDSKA at the gastroesophageal juction/multiple small polyps/mild  gastritis  . GIVENS CAPSULE STUDY N/A 06/07/2017   Procedure: GIVENS CAPSULE STUDY;  Surgeon: Danie Binder, MD;  Location: [facility];  Service: Endoscopy;  Laterality: N/A;  7:30am  . INSERT / REPLACE / REMOVE PACEMAKER     Last year per pt.(can't remember date)    Family History Family History  Problem Relation Age of Onset  . Colon polyps Neg Hx   . Colon cancer Neg Hx      Social  History  reports that she has never smoked. She has never used smokeless tobacco. She reports that she does not drink alcohol or use drugs.  Medications  Current Outpatient Medications:  .  acetaminophen (TYLENOL) 500 MG tablet, Take 1,000 mg by mouth 2 (two) times daily as needed for moderate pain or headache., Disp: , Rfl:  .  albuterol (PROVENTIL HFA;VENTOLIN HFA) 108 (90 BASE) MCG/ACT inhaler, Inhale 2 puffs into the lungs every 6 (six) hours as needed for shortness of breath., Disp: , Rfl:  .  amLODipine (NORVASC) 5 MG tablet, Take 1 tablet (5 mg total) by mouth daily., Disp: 90 tablet, Rfl: 0 .  aspirin EC 81 MG tablet, Take 81 mg by mouth at bedtime., Disp: , Rfl:  .  B Complex-C (B-COMPLEX WITH VITAMIN C) tablet, Take 1 tablet by mouth daily. Under the tongue, Disp: , Rfl:  .  busPIRone (BUSPAR) 5 MG tablet, Take 5 mg by mouth 2 (two) times daily., Disp: , Rfl:  .  Calcium Carbonate-Vitamin D (CALCIUM 600+D PO), Take 1 tablet by mouth 2 (two) times daily., Disp: , Rfl:  .  chlorthalidone (HYGROTON) 25 MG tablet, Take 12.5 mg by mouth daily. , Disp: , Rfl:  .  fluticasone (FLONASE) 50 MCG/ACT nasal spray, SPRAY 2 SPRAYS INTO EACH NOSTRIL ONCE DAILY, Disp: 16 g, Rfl: 0 .  lidocaine (XYLOCAINE) 2 % solution, TAKE 2 TEASPOONFULS BEFORE MEALS AND AT BEDTIME AS NEEDED MAY REPEAT EVERY 4 HOURS. (MAX OF 8 DOSES PER DAY), Disp: 300 mL, Rfl: 0 .  losartan (COZAAR) 100 MG tablet, Take 100 mg by mouth daily., Disp: , Rfl:  .  metoprolol succinate (TOPROL-XL) 50 MG 24 hr tablet, Take 75 mg by mouth daily. , Disp: , Rfl:  .  montelukast (SINGULAIR) 10 MG tablet, Take 10 mg by mouth daily. , Disp: , Rfl:  .  nitroGLYCERIN (NITROSTAT) 0.4 MG SL tablet, Place 0.4 mg under the tongue every 5 (five) minutes as needed for chest pain. , Disp: , Rfl:  .  pantoprazole (PROTONIX) 40 MG tablet, 1 po 30 mins prior to first and last meal (Patient taking differently: Take 40 mg by mouth daily. ), Disp: 60 tablet,  Rfl: 11 .  polyethylene glycol (MIRALAX / GLYCOLAX) packet, Take 17 g by mouth daily as needed for mild constipation or moderate constipation. , Disp: , Rfl:  .  potassium chloride (K-DUR,KLOR-CON) 10 MEQ tablet, Take 20 mEq by mouth 2 (two) times daily. , Disp: , Rfl:  .  Probiotic Product (PROBIOTIC PO), Take 1 capsule by mouth daily. [facility]., Disp: , Rfl:  .  sertraline (ZOLOFT) 100 MG tablet, Take 200 mg by mouth daily. , Disp: , Rfl:  .  Simethicone (GAS-X PO), Take 2 tablets by mouth daily as needed (gas)., Disp: , Rfl:  .  simvastatin (ZOCOR) 40 MG tablet, Take 40 mg by mouth at bedtime.  , Disp: , Rfl:  .  vitamin C (ASCORBIC ACID) 500 MG tablet, Take 500  mg by mouth daily., Disp: , Rfl:  .  warfarin (COUMADIN) 5 MG tablet, Take 2.5-5 mg by mouth See admin instructions. Alternate take 1/2 tab (2.10m) with 1 tab (52m every other day (2.5,5,2.5,5,...), Disp: , Rfl:   Allergies Biaxin [clarithromycin] and Lisinopril  Review of Systems Review of Systems - Oncology ROS negative other than palpitations.     Physical Exam  Vitals Wt Readings from Last 3 Encounters:  01/02/18 189 lb 8 oz (86 kg)  12/14/17 186 lb 1.9 oz (84.4 kg)  12/06/17 185 lb 12.8 oz (84.3 kg)   Temp Readings from Last 3 Encounters:  01/02/18 98.2 F (36.8 C) (Oral)  12/14/17 98 F (36.7 C) (Temporal)  12/06/17 (!) 97.3 F (36.3 C) (Oral)   BP Readings from Last 3 Encounters:  01/02/18 140/73  12/14/17 120/72  12/06/17 124/77   Pulse Readings from Last 3 Encounters:  01/02/18 88  12/14/17 68  12/06/17 83   Constitutional: Well-developed, well-nourished, and in no distress.   HENT: Head: Normocephalic and atraumatic.  Mouth/Throat: No oropharyngeal exudate. Mucosa moist. Eyes: Pupils are equal, round, and reactive to light. Conjunctivae are normal. No scleral icterus.  Neck: Normal range of motion. Neck supple. No JVD present.  Cardiovascular: Normal rate, regular rhythm and normal  heart sounds.  Exam reveals no gallop and no friction rub.   No murmur heard. Pulmonary/Chest: Effort normal and breath sounds normal. No respiratory distress. No wheezes.No rales.  Abdominal: Soft. Bowel sounds are normal. No distension. There is no tenderness. There is no guarding.  Musculoskeletal: No edema or tenderness.  Lymphadenopathy: No cervical, axillary or supraclavicular adenopathy.  Neurological: Alert and oriented to person, place, and time. No cranial nerve deficit.  Skin: Skin is warm and dry. No rash noted. No erythema. No pallor.  Psychiatric: Affect and judgment normal.   Labs No visits with results within 3 Day(s) from this visit.  Latest known visit with results is:  Appointment on 12/25/2017  Component Date Value Ref Range Status  . WBC 12/25/2017 5.7  4.0 - 10.5 K/uL Final  . RBC 12/25/2017 3.75* 3.87 - 5.11 MIL/uL Final  . Hemoglobin 12/25/2017 12.0  12.0 - 15.0 g/dL Final  . HCT 12/25/2017 36.4  36.0 - 46.0 % Final  . MCV 12/25/2017 97.1  78.0 - 100.0 fL Final  . MCH 12/25/2017 32.0  26.0 - 34.0 pg Final  . MCHC 12/25/2017 33.0  30.0 - 36.0 g/dL Final  . RDW 12/25/2017 14.2  11.5 - 15.5 % Final  . Platelets 12/25/2017 162  150 - 400 K/uL Final  . Neutrophils Relative % 12/25/2017 51  % Final  . Neutro Abs 12/25/2017 3.0  1.7 - 7.7 K/uL Final  . Lymphocytes Relative 12/25/2017 35  % Final  . Lymphs Abs 12/25/2017 2.0  0.7 - 4.0 K/uL Final  . Monocytes Relative 12/25/2017 8  % Final  . Monocytes Absolute 12/25/2017 0.4  0.1 - 1.0 K/uL Final  . Eosinophils Relative 12/25/2017 5  % Final  . Eosinophils Absolute 12/25/2017 0.3  0.0 - 0.7 K/uL Final  . Basophils Relative 12/25/2017 1  % Final  . Basophils Absolute 12/25/2017 0.0  0.0 - 0.1 K/uL Final   Performed at [facility] [place] [id number]. Sodium 12/25/2017 138  135 - 145 mmol/L Final  . Potassium 12/25/2017 3.5  3.5 - 5.1 mmol/L Final  . Chloride 12/25/2017 102  98 - 111  mmol/L Final  . CO2 12/25/2017  27  22 - 32 mmol/L Final  . Glucose, Bld 12/25/2017 89  70 - 99 mg/dL Final  . BUN 12/25/2017 14  8 - 23 mg/dL Final  . Creatinine, Ser 12/25/2017 0.85  0.44 - 1.00 mg/dL Final  . Calcium 12/25/2017 9.1  8.9 - 10.3 mg/dL Final  . Total Protein 12/25/2017 9.1* 6.5 - 8.1 g/dL Final  . Albumin 12/25/2017 3.7  3.5 - 5.0 g/dL Final  . AST 12/25/2017 25  15 - 41 U/L Final  . ALT 12/25/2017 17  0 - 44 U/L Final  . Alkaline Phosphatase 12/25/2017 65  38 - 126 U/L Final  . Total Bilirubin 12/25/2017 0.8  0.3 - 1.2 mg/dL Final  . GFR calc non Af Amer 12/25/2017 >60  >60 mL/min Final  . GFR calc Af Amer 12/25/2017 >60  >60 mL/min Final   Comment: (NOTE) The eGFR has been calculated using the CKD EPI equation. This calculation has not been validated in all clinical situations. eGFR's persistently <60 mL/min signify possible Chronic Kidney Disease.   Georgiann Hahn gap 12/25/2017 9  5 - 15 Final   Performed at [facility], [address] [place], [place] [address]  . LDH 12/25/2017 191  98 - 192 U/L Final   Performed at [facility], [address] [place], [place] [address]  . Ferritin 12/25/2017 171  11 - 307 ng/mL Final   Performed at [facility], [place] [address], [name], [place] [address]     Pathology Orders Placed This Encounter  Procedures  . CBC with Differential/Platelet    Standing Status:   Future    Standing Expiration Date:   01/03/2019  . Comprehensive metabolic panel    Standing Status:   Future    Standing Expiration Date:   01/03/2019  . Lactate dehydrogenase    Standing Status:   Future    Standing Expiration Date:   01/03/2019  . Ferritin    Standing Status:   Future    Standing Expiration Date:   01/03/2019  . TSH    Standing Status:   Future    Standing Expiration Date:   01/03/2019       Zoila Shutter MD

## 2018-01-17 ENCOUNTER — Other Ambulatory Visit: Payer: Self-pay | Admitting: Family Medicine

## 2018-02-15 ENCOUNTER — Telehealth: Payer: Self-pay | Admitting: Gastroenterology

## 2018-02-15 NOTE — Telephone Encounter (Signed)
Please call patient, she is having abd pain in the evening and wants some recommendations on what she should be eating to help control it

## 2018-02-15 NOTE — Telephone Encounter (Signed)
PLEASE CALL PT. SHE SHOULD FOLLOW THE LOWFODMAP DIET IF SHE IS HAVING BLOATING. AVOID DAIRY PRODUCTS.  I AGREE WITH OTHER RECOMMENDATIONS THAT WERE DISCUSSED.

## 2018-02-15 NOTE — Telephone Encounter (Signed)
PT is aware and the low fodmap is in the mail for her.

## 2018-02-15 NOTE — Telephone Encounter (Signed)
Pt said she has a lot of gas everyday, but having regular normal BM's.  She has the bloated feeling even after having her BM's.  She has a habit of eating supper about 6-7 pm and going to bed shortly after.   I told her she needs to have 3 hours between the meal and bedtime and that will help.  Forwarding to Dr. Oneida Alar for any advice.

## 2018-02-15 NOTE — Telephone Encounter (Signed)
Called pt, many rings and no answer.

## 2018-02-25 ENCOUNTER — Other Ambulatory Visit: Payer: Self-pay

## 2018-02-25 ENCOUNTER — Encounter (HOSPITAL_COMMUNITY): Payer: Self-pay | Admitting: Emergency Medicine

## 2018-02-25 ENCOUNTER — Emergency Department (HOSPITAL_COMMUNITY): Payer: Medicare Other

## 2018-02-25 ENCOUNTER — Emergency Department (HOSPITAL_COMMUNITY)
Admission: EM | Admit: 2018-02-25 | Discharge: 2018-02-25 | Disposition: A | Discharge status: Home / Self Care | Payer: Medicare Other | Attending: Emergency Medicine | Admitting: Emergency Medicine

## 2018-02-25 DIAGNOSIS — I10 Essential (primary) hypertension: Secondary | ICD-10-CM | POA: Insufficient documentation

## 2018-02-25 DIAGNOSIS — Z86718 Personal history of other venous thrombosis and embolism: Secondary | ICD-10-CM | POA: Diagnosis not present

## 2018-02-25 DIAGNOSIS — Z7901 Long term (current) use of anticoagulants: Secondary | ICD-10-CM | POA: Insufficient documentation

## 2018-02-25 DIAGNOSIS — Z79899 Other long term (current) drug therapy: Secondary | ICD-10-CM | POA: Diagnosis not present

## 2018-02-25 DIAGNOSIS — I2510 Atherosclerotic heart disease of native coronary artery without angina pectoris: Secondary | ICD-10-CM | POA: Diagnosis not present

## 2018-02-25 DIAGNOSIS — R1013 Epigastric pain: Secondary | ICD-10-CM | POA: Diagnosis present

## 2018-02-25 DIAGNOSIS — E785 Hyperlipidemia, unspecified: Secondary | ICD-10-CM | POA: Insufficient documentation

## 2018-02-25 DIAGNOSIS — R109 Unspecified abdominal pain: Secondary | ICD-10-CM

## 2018-02-25 DIAGNOSIS — E876 Hypokalemia: Secondary | ICD-10-CM

## 2018-02-25 DIAGNOSIS — I252 Old myocardial infarction: Secondary | ICD-10-CM | POA: Insufficient documentation

## 2018-02-25 LAB — BASIC METABOLIC PANEL
Anion gap: 11 (ref 5–15)
BUN: 13 mg/dL (ref 8–23)
CO2: 24 mmol/L (ref 22–32)
Calcium: 9.3 mg/dL (ref 8.9–10.3)
Chloride: 101 mmol/L (ref 98–111)
Creatinine, Ser: 0.79 mg/dL (ref 0.44–1.00)
GFR calc Af Amer: >60 mL/min (ref >60)
GFR calc non Af Amer: >60 mL/min (ref >60)
Glucose, Bld: 108 mg/dL — ABNORMAL HIGH (ref 70–99)
Potassium: 3.1 mmol/L — ABNORMAL LOW (ref 3.5–5.1)
Sodium: 136 mmol/L (ref 135–145)

## 2018-02-25 LAB — CBC
HCT: 41 % (ref 36.0–46.0)
Hemoglobin: 13.9 g/dL (ref 12.0–15.0)
MCH: 31.8 pg (ref 26.0–34.0)
MCHC: 33.9 g/dL (ref 30.0–36.0)
MCV: 93.8 fL (ref 78.0–100.0)
Platelets: 138 10*3/uL — ABNORMAL LOW (ref 150–400)
RBC: 4.37 MIL/uL (ref 3.87–5.11)
RDW: 14.1 % (ref 11.5–15.5)
WBC: 7.1 10*3/uL (ref 4.0–10.5)

## 2018-02-25 LAB — D-DIMER, QUANTITATIVE: D-Dimer, Quant: 0.39 ug/mL-FEU (ref 0.00–0.50)

## 2018-02-25 LAB — TROPONIN I
Troponin I: <0.03 ng/mL (ref <0.03)
Troponin I: <0.03 ng/mL (ref <0.03)

## 2018-02-25 LAB — LIPASE, BLOOD: Lipase: 47 U/L (ref 11–51)

## 2018-02-25 MED ORDER — FAMOTIDINE 20 MG PO TABS: 20.0000 mg | ORAL_TABLET | Freq: Two times a day (BID) | ORAL | 0 refills | Status: DC

## 2018-02-25 MED ORDER — SUCRALFATE 1 G PO TABS
1.0000 g | ORAL_TABLET | Freq: Once | ORAL | Status: AC
  Administered 2018-02-25: 1 g via ORAL
  Filled 2018-02-25: qty 1

## 2018-02-25 MED ORDER — SUCRALFATE 1 G PO TABS: 1.0000 g | ORAL_TABLET | Freq: Three times a day (TID) | ORAL | 0 refills | Status: DC

## 2018-02-25 MED ORDER — FAMOTIDINE 20 MG PO TABS
20.0000 mg | ORAL_TABLET | Freq: Once | ORAL | Status: AC
  Administered 2018-02-25: 20 mg via ORAL
  Filled 2018-02-25: qty 1

## 2018-02-25 MED ORDER — PANTOPRAZOLE SODIUM 20 MG PO TBEC: 40.0000 mg | DELAYED_RELEASE_TABLET | Freq: Every day | ORAL | 0 refills | Status: DC

## 2018-02-25 MED ORDER — POTASSIUM CHLORIDE CRYS ER 20 MEQ PO TBCR
40.0000 meq | EXTENDED_RELEASE_TABLET | Freq: Once | ORAL | Status: AC
  Administered 2018-02-25: 40 meq via ORAL
  Filled 2018-02-25: qty 2

## 2018-02-25 MED ORDER — PANTOPRAZOLE SODIUM 40 MG PO TBEC
40.0000 mg | DELAYED_RELEASE_TABLET | Freq: Once | ORAL | Status: AC
  Administered 2018-02-25: 40 mg via ORAL
  Filled 2018-02-25: qty 1

## 2018-02-25 NOTE — Discharge Instructions (Addendum)
Your lab test are negative for acute problems.  Your heart enzymes are negative for any heart related problem causing your pain.  Your x-ray is negative for any pneumonia or other related problems causing pain.  Please use Carafate with breakfast, lunch, dinner, and at bedtime.  Please use Protonix 2 times daily and Pepcid 2 times daily.  Please see your gastroenterology specialist as soon as possible concerning this discomfort.

## 2018-02-25 NOTE — ED Provider Notes (Signed)
[facility] EMERGENCY DEPARTMENT Provider Note   CSN: [id number] Arrival date & time: 02/25/18  5631     History   Chief Complaint Chief Complaint  Patient presents with  . Abdominal Pain    HPI Courtney Rogers is a 67 y.o. female.  Patient is a 67 year old female who presents to the emergency department with a complaint of heartburn and abdominal pain.  The patient states this problem started nearly a week ago.  She tried conservative measures including Pepto-Bismol but they only gave transient relief.  She was seen by her primary physician because she also had a sinus infection was treated.  She continued to have symptoms.  She was seen by 1 of the local urgent care physicians, and was again treated with antibiotics.  After about 3 days of this she developed some diarrhea.  She was later seen at the [facility] emergency department, but did not seem to get any significant answers or any significant relief.  She states she was given a GI cocktail, but it did not seem to help.  The problem persists today and so she came to this emergency department for additional evaluation and management.  No recent fever or chills reported.  No vomiting reported.  No loss of consciousness.  She has some right side chest pressure and some epigastric area discomfort.  No recent injury or trauma to the chest or abdomen.  No recent operations or procedures.  It is of note that the patient has had an acute myocardial infarction in the past.  She was medically managed.  She says that she has had a pacemaker put in for the last 2 to 3 years.  The history is provided by the patient.  Abdominal Pain   Pertinent negatives include dysuria, frequency, hematuria and arthralgias.    Past Medical History:  Diagnosis Date  . Acute myocardial infarction [facility]) 2009   CAD/no stent medically managed  . Anxiety disorder   . CAD (coronary artery disease)   . Hyperlipidemia   . Hypertension   . IBS (irritable bowel  syndrome)   . Overactive bladder   . PE (pulmonary embolism) JUN 2014  . Presence of permanent cardiac pacemaker   . Sleep apnea     Patient Active Problem List   Diagnosis Date Noted  . Essential hypertension 12/14/2017  . Iron deficiency anemia 08/03/2017  . Dyspepsia   . Coccydynia 04/19/2017  . Normocytic anemia 01/12/2017  . Fatty liver 11/03/2015  . Anxiety state 11/03/2015  . Weight loss 04/05/2011  . GERD 06/01/2010  . IRRITABLE BOWEL SYNDROME 07/21/2009  . Non-ulcer dyspepsia 04/30/2009    Past Surgical History:  Procedure Laterality Date  . ABDOMINAL HYSTERECTOMY    . BSO secondary to cyst    . CARDIAC CATHETERIZATION    . COLONOSCOPY  12/2009   Dr. West Carbo, propofol, normal. Next TCS 12/2019  . COLONOSCOPY N/A 05/22/2017   Procedure: COLONOSCOPY;  Surgeon: Danie Binder, MD;  Location: [facility] ENDO SUITE;  Service: Endoscopy;  Laterality: N/A;  10:30am  . ESOPHAGOGASTRODUODENOSCOPY  05/11/09   schatzki ring/small hiatal hernia/path:gastritis  . ESOPHAGOGASTRODUODENOSCOPY N/A 05/22/2017   Procedure: ESOPHAGOGASTRODUODENOSCOPY (EGD);  Surgeon: Danie Binder, MD;  Location: [facility] ENDO SUITE;  Service: Endoscopy;  Laterality: N/A;  . ESOPHAGOGASTRODUODENOSCOPY (EGD) WITH ESOPHAGEAL DILATION N/A 04/01/2013   [id number] at the gastroesophageal juction/multiple small polyps/mild gastritis  . GIVENS CAPSULE STUDY N/A 06/07/2017   Procedure: GIVENS CAPSULE STUDY;  Surgeon: Danie Binder, MD;  Location: [facility]  ENDO SUITE;  Service: Endoscopy;  Laterality: N/A;  7:30am  . INSERT / REPLACE / REMOVE PACEMAKER     Last year per pt.(can't remember date)     OB History   None      Home Medications    Prior to Admission medications   Medication Sig Start Date End Date Taking? Authorizing Provider  acetaminophen (TYLENOL) 500 MG tablet Take 1,000 mg by mouth 2 (two) times daily as needed for moderate pain or headache.    [provider]  albuterol (PROVENTIL  HFA;VENTOLIN HFA) 108 (90 BASE) MCG/ACT inhaler Inhale 2 puffs into the lungs every 6 (six) hours as needed for shortness of breath.    [provider]  amLODipine (NORVASC) 5 MG tablet Take 1 tablet (5 mg total) by mouth daily. 12/14/17   Caren Macadam, MD  amoxicillin-clavulanate (AUGMENTIN) 875-125 MG tablet  02/19/18   [provider]  aspirin EC 81 MG tablet Take 81 mg by mouth at bedtime.    [provider]  B Complex-C (B-COMPLEX WITH VITAMIN C) tablet Take 1 tablet by mouth daily. Under the tongue    [provider]  busPIRone (BUSPAR) 5 MG tablet Take 5 mg by mouth 2 (two) times daily. 11/25/16   [provider]  Calcium Carbonate-Vitamin D (CALCIUM 600+D PO) Take 1 tablet by mouth 2 (two) times daily.    [provider]  chlorthalidone (HYGROTON) 25 MG tablet Take 12.5 mg by mouth daily.     [provider]  fluticasone (FLONASE) 50 MCG/ACT nasal spray SPRAY 2 SPRAYS INTO EACH NOSTRIL ONCE DAILY 01/19/18   Caren Macadam, MD  gabapentin (NEURONTIN) 600 MG tablet  02/12/18   [provider]  lidocaine (XYLOCAINE) 2 % solution TAKE 2 TEASPOONFULS BEFORE MEALS AND AT BEDTIME AS NEEDED MAY REPEAT EVERY 4 HOURS. (MAX OF 8 DOSES PER DAY) 12/13/17   Carlis Stable, NP  losartan (COZAAR) 100 MG tablet Take 100 mg by mouth daily.    [provider]  metoprolol succinate (TOPROL-XL) 50 MG 24 hr tablet Take 75 mg by mouth daily.  11/25/16   [provider]  montelukast (SINGULAIR) 10 MG tablet Take 10 mg by mouth daily.     [provider]  nitroGLYCERIN (NITROSTAT) 0.4 MG SL tablet Place 0.4 mg under the tongue every 5 (five) minutes as needed for chest pain.     [provider]  pantoprazole (PROTONIX) 40 MG tablet 1 po 30 mins prior to first and last meal Patient taking differently: Take 40 mg by mouth daily.  01/12/17   Fields, Marga Melnick, MD  polyethylene glycol (MIRALAX / GLYCOLAX) packet Take 17 g by  mouth daily as needed for mild constipation or moderate constipation.     [provider]  potassium chloride (K-DUR,KLOR-CON) 10 MEQ tablet Take 20 mEq by mouth 2 (two) times daily.     [provider]  Probiotic Product (PROBIOTIC PO) Take 1 capsule by mouth daily. [facility].    [provider]  sertraline (ZOLOFT) 100 MG tablet Take 200 mg by mouth daily.     [provider]  Simethicone (GAS-X PO) Take 2 tablets by mouth daily as needed (gas).    [provider]  simvastatin (ZOCOR) 40 MG tablet Take 40 mg by mouth at bedtime.      [provider]  vitamin C (ASCORBIC ACID) 500 MG tablet Take 500 mg by mouth daily.    [provider]  warfarin (COUMADIN) 5 MG tablet Take 2.5-5 mg by mouth See admin instructions. Alternate take 1/2 tab (2.5mg ) with 1 tab (5mg ) every other day (2.5,5,2.5,5,...)    [provider]    Family History Family History  Problem Relation Age of Onset  . Colon polyps Neg Hx   . Colon cancer Neg Hx     Social History Social History   Tobacco Use  . Smoking status: Never Smoker  . Smokeless tobacco: Never Used  . Tobacco comment: Never smoked   Substance Use Topics  . Alcohol use: No    Alcohol/week: 0.0 standard drinks  . Drug use: No     Allergies   Biaxin [clarithromycin] and Lisinopril   Review of Systems Review of Systems  Constitutional: Negative for activity change.       All ROS Neg except as noted in HPI  HENT: Negative for nosebleeds.   Eyes: Negative for photophobia and discharge.  Respiratory: Negative for cough, shortness of breath and wheezing.   Cardiovascular: Negative for chest pain and palpitations.  Gastrointestinal: Positive for abdominal pain. Negative for blood in stool.       Heartburn  Genitourinary: Negative for dysuria, frequency and hematuria.  Musculoskeletal: Negative for arthralgias, back pain and neck pain.  Skin: Negative.     Neurological: Negative for dizziness, seizures and speech difficulty.  Psychiatric/Behavioral: Negative for confusion and hallucinations.     Physical Exam Updated Vital Signs BP (!) 174/83   Pulse 77   Temp 98 F (36.7 C) (Oral)   Resp 10   Ht 5\' 5"  (1.651 m)   Wt 83.9 kg   SpO2 99%   BMI 30.79 kg/m   Physical Exam  Constitutional: She is oriented to person, place, and time. She appears well-developed and well-nourished.  Non-toxic appearance.  HENT:  Head: Normocephalic.  Right Ear: Tympanic membrane and external ear normal.  Left Ear: Tympanic membrane and external ear normal.  Eyes: Pupils are equal, round, and reactive to light. EOM and lids are normal.  Neck: Normal range of motion. Neck supple. Carotid bruit is not present.  Cardiovascular: Normal rate, regular rhythm, normal heart sounds, intact distal pulses and normal pulses.  Pulmonary/Chest: Breath sounds normal. No respiratory distress.  Patient speaks in complete sentences.  There is symmetrical rise and fall of the chest.  Abdominal: Soft. Bowel sounds are normal. There is no tenderness. There is no guarding.  No mass or pulsating mass appreciated of the abdomen.  There is some epigastric area discomfort.  Bowel sounds are present and active.  Musculoskeletal: Normal range of motion.  Lymphadenopathy:       Head (right side): No submandibular adenopathy present.       Head (left side): No submandibular adenopathy present.    She has no cervical adenopathy.  Neurological: She is alert and oriented to person, place, and time. She has normal strength. No cranial nerve deficit or sensory deficit.  Skin: Skin is warm and dry.  Psychiatric: She has a normal mood and affect. Her speech is normal.  Nursing note and vitals reviewed.    ED Treatments / Results  Labs (all labs ordered are listed, but only abnormal results are displayed) Labs Reviewed  BASIC METABOLIC PANEL - Abnormal; Notable for the following  components:      Result Value   Potassium 3.1 (*)    Glucose, Bld 108 (*)    All other components within normal limits  CBC - Abnormal; Notable for  the following components:   Platelets 138 (*)    All other components within normal limits  TROPONIN I    EKG None  Radiology Dg Chest 2 View  Result Date: 02/25/2018 CLINICAL DATA:  Chest pain EXAM: CHEST - 2 VIEW COMPARISON:  11/13/2017 chest radiograph. FINDINGS: Stable configuration of 2 lead left subclavian pacemaker. Stable cardiomediastinal silhouette with normal heart size. No pneumothorax. No pleural effusion. Lungs appear clear, with no acute consolidative airspace disease and no pulmonary edema. IMPRESSION: No active cardiopulmonary disease. Electronically Signed   By: Ilona Sorrel M.D.   On: 02/25/2018 09:52    Procedures Procedures (including critical care time)  Medications Ordered in ED Medications - No data to display   Initial Impression / Assessment and Plan / ED Course  I have reviewed the triage vital signs and the nursing notes.  Pertinent labs & imaging results that were available during my care of the patient were reviewed by me and considered in my medical decision making (see chart for details).  Clinical Course as of Feb 25 1338  Sun Feb 25, 2018  3329 ECG AV pacer rate of 76 left bundle-branch pattern, similar to prior 6/19   [MB]  8256 67 year old female who has been experiencing upper abdominal into chest burning pain for over a week.  She does have some history of reflux but never lasted this long.  She is got a paced EKG and she is had 2- troponin negative d-dimer.  She does have a GI doctor so I recommended that she try to get an appointment with her and she said she has got an appointment in December but was having too much symptoms.  We will try her on some new medication and asked her to contact her GI doctor to see if they can get her in sooner.   [MB]    Clinical Course User Index [MB] Hayden Rasmussen, MD      Final Clinical Impressions(s) / ED Diagnoses MDM  Vital signs reviewed.  Initial troponin is negative for acute problem.  Lipase is normal.  D-dimer is negative.  Basic metabolic panel shows the potassium to be low at 3.1.  Complete blood count shows the platelets to be slightly low at 138, otherwise within normal limits.  Second troponin also negative.  Chest x-ray is negative for acute problem.  Patient examined with me by Dr. Melina Copa.  I discussed with the patient the findings on the examination, as well as the findings on the laboratory review.  The electrocardiogram shows good pacing.  And the troponin shows no acute event, doubt cardiac reason for chest discomfort and indigestion.  D-dimer is negative, doubt pulmonary embolism.  Complete blood count does not show evidence of major infection, temperature is within normal limits.  Pulse rate is also within normal limits.  Doubt infectious process.  Patient does not have a history of injury or trauma.  No recent operations or procedures involving the abdomen or chest area.  Examination favors exacerbation of the reflux with abdominal pain.  The patient will be treated with Pepcid, Protonix, and Carafate.  Patient will use Tylenol extra strength for soreness if needed.  Patient is to follow-up with the gastroenterology specialist.  Patient will return to the emergency department if any changes in condition, problems, or concerns.   Final diagnoses:  Abdominal pain, unspecified abdominal location  Hypokalemia    ED Discharge Orders         Ordered    famotidine (  PEPCID) 20 MG tablet  2 times daily     02/25/18 1320    pantoprazole (PROTONIX) 20 MG tablet  Daily     02/25/18 1320    sucralfate (CARAFATE) 1 g tablet  3 times daily with meals & bedtime     02/25/18 1320           Lily Kocher, PA-C 02/25/18 1348    Hayden Rasmussen, MD 02/25/18 1836

## 2018-02-25 NOTE — ED Triage Notes (Signed)
Pt states she has been having "heartburn" for a week.  States she has been to urgent care and to [facility] ER for same symptoms with no answer and no relief.  Went to UC for sinus infection and developed diarrhea after being on abx x 3 days.  Went to [facility] on Thursday and was given GI cocktail with no relief.

## 2018-02-26 ENCOUNTER — Telehealth: Payer: Self-pay | Admitting: Gastroenterology

## 2018-02-26 NOTE — Telephone Encounter (Signed)
THE 16TH WAS USED FOR THE PATIENT YOU SENT ME THIS MORNING, CAN I PUT HER IN THE NEXT DAY URGENT SLOT?

## 2018-02-26 NOTE — Telephone Encounter (Signed)
SHE CAN ONLY COME ON WED OCT 16.

## 2018-02-26 NOTE — Telephone Encounter (Signed)
PT HAVING CHEST PAIN. SEEN IN ED IN [place] AND [facility]. RECOMMENDED SHE SEE CARDIOLOGY THIS WEEK. [facility] W/ [facility] ON OCT 16 @1145AM , Dx: GERD. USE VISCOUS LIDOCAINE WITH SYRINGE UP TO 8 TIMES A DAY TO RELIEVE CHEST PAIN. PEPCID IF NEEDED AS WELL. INCREASE PANTOPRAZOLE TO BID. AVOID REFULX TRIGGERS.  FOLLOW LIFESTYLE RECOMMENDATION TO HELP MANAGE YOUR CHEST PAIN.   Lifestyle and home remedies TO MANAGE REFLUX/CHEST PAIN  You may eliminate or reduce the frequency of heartburn by making the following lifestyle changes:  . Control your weight. Being overweight is a major risk factor for heartburn and GERD. Excess pounds put pressure on your abdomen, pushing up your stomach and causing acid to back up into your esophagus.   . Eat smaller meals. 4 TO 6 MEALS A DAY. This reduces pressure on the lower esophageal sphincter, helping to prevent the valve from opening and acid from washing back into your esophagus.   Dolphus Jenny your belt. Clothes that fit tightly around your waist put pressure on your abdomen and the lower esophageal sphincter.   . Eliminate heartburn triggers. Everyone has specific triggers. Common triggers such as fatty or fried foods, spicy food, tomato sauce, carbonated beverages, alcohol, chocolate, mint, garlic, onion, caffeine and nicotine may make heartburn worse.   Marland Kitchen Avoid stooping or bending. Tying your shoes is OK. Bending over for longer periods to weed your garden isn't, especially soon after eating.   . Don't lie down after a meal. Wait at least three to four hours after eating before going to bed, and don't lie down right after eating.   Marland Kitchen PUT THE HEAD OF YOUR BED ON 6 INCH BLOCKS.   Alternative medicine . Several home remedies exist for treating GERD, but they provide only temporary relief. They include drinking baking soda (sodium bicarbonate) added to water or drinking other fluids such as baking soda mixed with cream of tartar and water.  . Although these liquids create  temporary relief by neutralizing, washing away or buffering acids, eventually they aggravate the situation by adding gas and fluid to your stomach, increasing pressure and causing more acid reflux. Further, adding more sodium to your diet may increase your blood pressure and add stress to your heart, and excessive bicarbonate ingestion can alter the acid-base balance in your body.      Low-Fat Diet BREADS, CEREALS, PASTA, RICE, DRIED PEAS, AND BEANS These products are high in carbohydrates and most are low in fat. Therefore, they can be increased in the diet as substitutes for fatty foods. They too, however, contain calories and should not be eaten in excess. Cereals can be eaten for snacks as well as for breakfast.   FRUITS AND VEGETABLES It is good to eat fruits and vegetables. Besides being sources of fiber, both are rich in vitamins and some minerals. They help you get the daily allowances of these nutrients. Fruits and vegetables can be used for snacks and desserts.  MEATS Limit lean meat, chicken, [place], and fish to no more than 6 ounces per day. Beef, Pork, and Lamb Use lean cuts of beef, pork, and lamb. Lean cuts include:  Extra-lean ground beef.  Arm roast.  Sirloin tip.  Center-cut ham.  Round steak.  Loin chops.  Rump roast.  Tenderloin.  Trim all fat off the outside of meats before cooking. It is not necessary to severely decrease the intake of red meat, but lean choices should be made. Lean meat is rich in protein and contains a highly  absorbable form of iron. Premenopausal women, in particular, should avoid reducing lean red meat because this could increase the risk for low red blood cells (iron-deficiency anemia).  Chicken and [place] These are good sources of protein. The fat of poultry can be reduced by removing the skin and underlying fat layers before cooking. Chicken and [place] can be substituted for lean red meat in the diet. Poultry should not be fried or covered  with high-fat sauces. Fish and Shellfish Fish is a good source of protein. Shellfish contain cholesterol, but they usually are low in saturated fatty acids. The preparation of fish is important. Like chicken and [place], they should not be fried or covered with high-fat sauces. EGGS Egg whites contain no fat or cholesterol. They can be eaten often. Try 1 to 2 egg whites instead of whole eggs in recipes or use egg substitutes that do not contain yolk. MILK AND DAIRY PRODUCTS Use skim or 1% milk instead of 2% or whole milk. Decrease whole milk, natural, and processed cheeses. Use nonfat or low-fat (2%) cottage cheese or low-fat cheeses made from vegetable oils. Choose nonfat or low-fat (1 to 2%) yogurt. Experiment with evaporated skim milk in recipes that call for heavy cream. Substitute low-fat yogurt or low-fat cottage cheese for sour cream in dips and salad dressings. Have at least 2 servings of low-fat dairy products, such as 2 glasses of skim (or 1%) milk each day to help get your daily calcium intake. FATS AND OILS Reduce the total intake of fats, especially saturated fat. Butterfat, lard, and beef fats are high in saturated fat and cholesterol. These should be avoided as much as possible. Vegetable fats do not contain cholesterol, but certain vegetable fats, such as coconut oil, palm oil, and palm kernel oil are very high in saturated fats. These should be limited. These fats are often used in bakery goods, processed foods, popcorn, oils, and nondairy creamers. Vegetable shortenings and some peanut butters contain hydrogenated oils, which are also saturated fats. Read the labels on these foods and check for saturated vegetable oils. Unsaturated vegetable oils and fats do not raise blood cholesterol. However, they should be limited because they are fats and are high in calories. Total fat should still be limited to 30% of your daily caloric intake. Desirable liquid vegetable oils are corn oil, cottonseed  oil, olive oil, canola oil, safflower oil, soybean oil, and sunflower oil. Peanut oil is not as good, but small amounts are acceptable. Buy a heart-healthy tub margarine that has no partially hydrogenated oils in the ingredients. Mayonnaise and salad dressings often are made from unsaturated fats, but they should also be limited because of their high calorie and fat content. Seeds, nuts, peanut butter, olives, and avocados are high in fat, but the fat is mainly the unsaturated type. These foods should be limited mainly to avoid excess calories and fat. OTHER EATING TIPS Snacks  Most sweets should be limited as snacks. They tend to be rich in calories and fats, and their caloric content outweighs their nutritional value. Some good choices in snacks are graham crackers, melba toast, soda crackers, bagels (no egg), English muffins, fruits, and vegetables. These snacks are preferable to snack crackers, [place] fries, TORTILLA CHIPS, and POTATO chips. Popcorn should be air-popped or cooked in small amounts of liquid vegetable oil. Desserts Eat fruit, low-fat yogurt, and fruit ices instead of pastries, cake, and cookies. Sherbet, angel food cake, gelatin dessert, frozen low-fat yogurt, or other frozen products that do not contain  saturated fat (pure fruit juice bars, frozen ice pops) are also acceptable.  COOKING METHODS Choose those methods that use little or no fat. They include: Poaching.  Braising.  Steaming.  Grilling.  Baking.  Stir-frying.  Broiling.  Microwaving.  Foods can be cooked in a nonstick pan without added fat, or use a nonfat cooking spray in regular cookware. Limit fried foods and avoid frying in saturated fat. Add moisture to lean meats by using water, broth, cooking wines, and other nonfat or low-fat sauces along with the cooking methods mentioned above. Soups and stews should be chilled after cooking. The fat that forms on top after a few hours in the refrigerator should be skimmed  off. When preparing meals, avoid using excess salt. Salt can contribute to raising blood pressure in some people.  EATING AWAY FROM HOME Order entres, potatoes, and vegetables without sauces or butter. When meat exceeds the size of a deck of cards (3 to 4 ounces), the rest can be taken home for another meal. Choose vegetable or fruit salads and ask for low-calorie salad dressings to be served on the side. Use dressings sparingly. Limit high-fat toppings, such as bacon, crumbled eggs, cheese, sunflower seeds, and olives. Ask for heart-healthy tub margarine instead of butter.

## 2018-03-06 ENCOUNTER — Telehealth: Payer: Self-pay | Admitting: Gastroenterology

## 2018-03-06 NOTE — Telephone Encounter (Signed)
PT SHOULD BE SEEN OCT 16@1145 .

## 2018-03-06 NOTE — Telephone Encounter (Signed)
Pt called this morning to confirm her OV with SF tomorrow at 1145. Patient isn't on the schedule for tomorrow, but scheduled to see SF in Dec. Please advise if you want to see her tomorrow so I can call her back. [phone]

## 2018-03-07 ENCOUNTER — Ambulatory Visit (INDEPENDENT_AMBULATORY_CARE_PROVIDER_SITE_OTHER): Payer: Medicare Other | Admitting: Gastroenterology

## 2018-03-07 ENCOUNTER — Encounter: Payer: Self-pay | Admitting: Gastroenterology

## 2018-03-07 ENCOUNTER — Telehealth: Payer: Self-pay

## 2018-03-07 DIAGNOSIS — K219 Gastro-esophageal reflux disease without esophagitis: Secondary | ICD-10-CM

## 2018-03-07 DIAGNOSIS — K30 Functional dyspepsia: Secondary | ICD-10-CM | POA: Diagnosis not present

## 2018-03-07 MED ORDER — FAMOTIDINE 20 MG PO TABS: 20.0000 mg | ORAL_TABLET | Freq: Two times a day (BID) | ORAL | 0 refills | Status: DC

## 2018-03-07 MED ORDER — PANTOPRAZOLE SODIUM 40 MG PO TBEC: 40.0000 mg | DELAYED_RELEASE_TABLET | Freq: Two times a day (BID) | ORAL | 11 refills | Status: DC

## 2018-03-07 NOTE — Assessment & Plan Note (Signed)
SYMPTOMS NOT IDEALLY CONTROLLED due to reduced dose OF PROTONIX.  TO REDUCE ABDOMINAL PAIN: 1. FOLLOW LOW FAT/SOFT MECHANICAL DIET. NO FRIED FOOD. MEATS SHOULD BE BAKED, BROILED, OR BOILED FOR 2 WEEKS. SEE INFO BELOW. 2. CONTINUE U[place]. TAKE 30 MINUTES PRIOR TO MEALS TWICE DAILY. 3. PEPCID TWICE DAILY MON-FRI FOR 2 WEEKS THE USE AS NEEDED. 4. USE VISCOUS LIDOCAINE OR PEPTO BISMOL WHEN NEEDED FOR ADDITIONAL RELIEF FOR PAIN.  SEE CARDIOLOGY TODAY.  PLEASE CALL IN ONE MONTH IF YOUR CONSTIPATION IS NOT BETTER.   FOLLOW UP IN 4 MOS.

## 2018-03-07 NOTE — Telephone Encounter (Signed)
Per Erline Levine pt has been added to the schedule and pt is aware.

## 2018-03-07 NOTE — Telephone Encounter (Signed)
Pt is aware.  

## 2018-03-07 NOTE — Telephone Encounter (Signed)
Pt left VM that she saw Dr. Oneida Alar at the hospital last week and she told her to come in on 03/07/2018 at 11:45 am.   She was just calling to see if she can still come in.   I called and told her I will check with Dr. Oneida Alar when she comes in this morning at 9:00 am.

## 2018-03-07 NOTE — Progress Notes (Signed)
Subjective:    Patient ID: Courtney Rogers, female    DOB: 1951-02-07, 67 y.o.   MRN: [id number]  [facility]   HPI HAD SINUS INFECTION(AUGMENTIN) STARTED ABX(OCT 1) AND STARTED TO HAVE LOOSE STOOLS A COUPLE OF DAYS LATER. LAST DOSE OF ABX:  OCT 5-6. R PRESSURE IN CHEST W/ ABX BUT IS GONE. FEELING DISCOMFORT IN EPIGASTRIUM OFF AND ON SINCE PAST 2 DAYS. When taking [place] GO DOWN  AND WAS COMING BACK UP BUT NOW.  SHE IS AN ANXIOUS PERSON. CUT DOWN ON EATING OUT SINCE BURNING PAIN/ACID FELLING STARTED. TRIGGER: NOT SURE. USUALLY TAKES PEPTO FOR FLARES BUT THIS SEEM PROLONGED. LOOSE STOOL IS GONE. BMs: EVERY DAY. TOOK MIRALAX THIS AM. FEELS COLD. STOOL IS BLACK AFTER PEPTO. TRIED VISCOUS LIDOCAINE(??HELPED) AND PEPCID(DID OK). CUT PROTONIX DOWN TO ONCE A DAY.  PT DENIES FEVER, CHILLS, HEMATOCHEZIA, HEMATEMESIS, nausea, vomiting, melena, diarrhea, CHEST PAIN, OR SHORTNESS OF BREATH.  Past Medical History:  Diagnosis Date  . Acute myocardial infarction [facility]) 2009   CAD/no stent medically managed  . Anxiety disorder   . CAD (coronary artery disease)   . Hyperlipidemia   . Hypertension   . IBS (irritable bowel syndrome)   . Overactive bladder   . PE (pulmonary embolism) JUN 2014  . Presence of permanent cardiac pacemaker   . Sleep apnea    Past Surgical History:  Procedure Laterality Date  . ABDOMINAL HYSTERECTOMY    . BSO secondary to cyst    . CARDIAC CATHETERIZATION    . COLONOSCOPY  12/2009   Dr. West Carbo, propofol, normal. Next TCS 12/2019  . COLONOSCOPY N/A 05/22/2017   Procedure: COLONOSCOPY;  Surgeon: Danie Binder, MD;  [facility];  Service: Endoscopy;  Laterality: N/A;  10:30am  . ESOPHAGOGASTRODUODENOSCOPY  05/11/09   schatzki ring/small hiatal hernia/path:gastritis  . ESOPHAGOGASTRODUODENOSCOPY N/A 05/22/2017   Procedure: ESOPHAGOGASTRODUODENOSCOPY (EGD);  Surgeon: Danie Binder, MD;  Location: [facility];   Service: Endoscopy;  Laterality: N/A;  . ESOPHAGOGASTRODUODENOSCOPY (EGD) WITH ESOPHAGEAL DILATION N/A 04/01/2013   [id number] at the gastroesophageal juction/multiple small polyps/mild gastritis  . GIVENS CAPSULE STUDY N/A 06/07/2017   Procedure: GIVENS CAPSULE STUDY;  Surgeon: Danie Binder, MD;  Location: [facility];  Service: Endoscopy;  Laterality: N/A;  7:30am  . INSERT / REPLACE / REMOVE PACEMAKER     Last year per pt.(can't remember date)   Allergies  Allergen Reactions  . Biaxin [Clarithromycin] Other (See Comments)    Stomach problems   . Lisinopril Swelling   Current Outpatient Medications  Medication Sig    . acetaminophen (TYLENOL) 500 MG tablet Take 1,000 mg by mouth 2 (two) times daily as needed for moderate pain or headache.    . albuterol (PROVENTIL HFA;VENTOLIN HFA) 108 (90 BASE) MCG/ACT inhaler Inhale 2 puffs into the lungs every 6 (six) hours as needed for shortness of breath.    Marland Kitchen amLODipine (NORVASC) 5 MG tablet Take 1 tablet (5 mg total) by mouth daily.    Marland Kitchen aspirin EC 81 MG tablet Take 81 mg by mouth at bedtime. WITH WATER   . b complex vitamins tablet Take 1 tablet by mouth daily.    Marland Kitchen bismuth subsalicylate (PEPTO BISMOL) 262 MG/15ML suspension Take 30 mLs by mouth every 6 (six) hours as needed.    . busPIRone (BUSPAR) 5 MG tablet Take 5 mg by mouth 2 (two) times daily.    . Calcium Carbonate-Vitamin D (  CALCIUM 600+D PO) Take 1 tablet by mouth 2 (two) times daily.    . chlorthalidone (HYGROTON) 25 MG tablet Take 25 mg by mouth daily.     . Cyanocobalamin (B-12) 2500 MCG TABS Take 1 capsule by mouth daily.    . famotidine (PEPCID) 20 MG tablet  (Patient taking differently: Take 20 mg by mouth daily as needed. )    . lidocaine (XYLOCAINE) 2 % solution TAKE 2 TEASPOONFULS BEFORE MEALS AND AT BEDTIME AS NEEDED MAY REPEAT EVERY 4 HOURS. (MAX OF 8 DOSES PER DAY) (Patient taking differently: Use as directed 10 mLs in the mouth or throat. TAKE 2 TEASPOONFULS BEFORE  MEALS AND AT BEDTIME AS NEEDED MAY REPEAT EVERY 4 HOURS. (MAX OF 8 DOSES PER DAY))    . losartan (COZAAR) 100 MG tablet Take 100 mg by mouth daily.    . metoprolol succinate (TOPROL-XL) 50 MG 24 hr tablet Take 50 mg by mouth daily.     . montelukast (SINGULAIR) 10 MG tablet Take 10 mg by mouth daily.     . nitroGLYCERIN (NITROSTAT) 0.4 MG SL tablet Place 0.4 mg under the tongue every 5 (five) minutes as needed for chest pain.     . pantoprazole (PROTONIX) 40 MG tablet Take 40 by mouth daily.    Marland Kitchen Phenylephrine-DM-GG-APAP (MUCINEX FAST-MAX COLD FLU PO) Take 1 tablet by mouth every 4 (four) hours as needed (sinus infection).    . polyethylene glycol (MIRALAX / GLYCOLAX) packet Take 17 g by mouth daily as needed for mild constipation or moderate constipation.     . potassium chloride (K-DUR,KLOR-CON) 10 MEQ tablet Take 40 mEq by mouth 2 (two) times daily.     . Probiotic Product (PROBIOTIC PO) Take 1 capsule by mouth daily. [facility].    . sertraline (ZOLOFT) 100 MG tablet Take 200 mg by mouth daily.     . Simethicone (GAS-X PO) Take 2 tablets by mouth daily as needed (gas).    . simvastatin (ZOCOR) 40 MG tablet Take 40 mg by mouth at bedtime.      . vitamin C (ASCORBIC ACID) 500 MG tablet Take 500 mg by mouth daily.    Marland Kitchen warfarin (COUMADIN) 1 MG tablet Take 0.5 mg by mouth daily.    Marland Kitchen warfarin (COUMADIN) 3 MG tablet Take 3 mg by mouth daily.     .      . fluticasone (FLONASE) 50 MCG/ACT nasal spray SPRAY 2 SPRAYS INTO EACH NOSTRIL ONCE DAILY (Patient not taking: Reported on 03/07/2018)    . gabapentin (NEURONTIN) 600 MG tablet Take 600 mg by mouth 2 (two) times daily.     .       Review of Systems PER HPI OTHERWISE ALL SYSTEMS ARE NEGATIVE.    Objective:   Physical Exam  Constitutional: She is oriented to person, place, and time. She appears well-developed and well-nourished. No distress.  HENT:  Head: Normocephalic and atraumatic.  Mouth/Throat: Oropharynx is clear and moist.  No oropharyngeal exudate.  Eyes: Pupils are equal, round, and reactive to light. No scleral icterus.  Neck: Normal range of motion. Neck supple.  Cardiovascular: Normal rate and regular rhythm.  Murmur heard. Pulmonary/Chest: Effort normal and breath sounds normal. No respiratory distress.  Abdominal: Soft. Bowel sounds are normal. She exhibits no distension. There is tenderness (MILD SUPRAPUBIC ). There is no rebound and no guarding.  Musculoskeletal: She exhibits no edema.  Lymphadenopathy:    She has no cervical adenopathy.  Neurological: She is  alert and oriented to person, place, and time.  NO  NEW FOCAL DEFICITS  Psychiatric:  SLIGHTLY ANXIOUS MOOD, NL AFFECT  Vitals reviewed.     Assessment & Plan:

## 2018-03-07 NOTE — Progress Notes (Signed)
CC'D TO PCP °

## 2018-03-07 NOTE — Progress Notes (Signed)
ON RECALL  °

## 2018-03-07 NOTE — Patient Instructions (Addendum)
TO REDUCE ABDOMINAL PAIN: 1. FOLLOW LOW FAT/SOFT MECHANICAL DIET. NO FRIED FOOD. MEATS SHOULD BE BAKED, BROILED, OR BOILED FOR 2 WEEKS. SEE INFO BELOW. 2. CONTINUE [place]. TAKE 30 MINUTES PRIOR TO MEALS TWICE DAILY. 3. PEPCID TWICE DAILY MON-FRI FOR 2 WEEKS THE USE AS NEEDED. 4. USE VISCOUS LIDOCAINE OR PEPTO BISMOL WHEN NEEDED FOR ADDITIONAL RELIEF FOR PAIN.  SEE CARDIOLOGY TODAY.  PLEASE CALL IN ONE MONTH IF YOUR CONSTIPATION IS NOT BETTER.   FOLLOW UP IN 4 MOS.   SOFT MECHANICAL DIET This SOFT MECHANICAL DIET is restricted to:  Foods that are moist, soft-textured, and easy to chew and swallow.   Meats that are ground or are minced no larger than one-quarter inch pieces. Meats are moist with gravy or sauce added.   Foods that do not include bread or bread-like textures except soft pancakes, well-moistened with syrup or sauce.   Textures with some chewing ability required.   Casseroles without rice.   Cooked vegetables that are less than half an inch in size and easily mashed with a fork. No cooked corn, peas, broccoli, cauliflower, cabbage, Brussels sprouts, asparagus, or other fibrous, non-tender or rubbery cooked vegetables.   Canned fruit except for pineapple. Fruit must be cut into pieces no larger than half an inch in size.   Foods that do not include nuts, seeds, coconut, or sticky textures.   FOOD TEXTURES FOR DYSPHAGIA DIET LEVEL 2 -SOFT MECHANICAL DIET (includes all foods on Dysphagia Diet Level 1 - Pureed, in addition to the foods listed below)  FOOD GROUP: Breads. RECOMMENDED: Soft pancakes, well-moistened with syrup or sauce.  AVOID: All others.  FOOD GROUP: Cereals.  RECOMMENDED: Cooked cereals with little texture, including oatmeal. Unprocessed wheat bran stirred into cereals for bulk. Note: If thin liquids are restricted, it is important that all of the liquid is absorbed into the cereal.  AVOID: All dry cereals and any cooked cereals that may contain  flax seeds or other seeds or nuts. Whole-grain, dry, or coarse cereals. Cereals with nuts, seeds, dried fruit, and/or coconut.  FOOD GROUP: Desserts. RECOMMENDED: Pudding, custard. Soft fruit pies with bottom crust only. Canned fruit (excluding pineapple). Soft, moist cakes with icing.Frozen malts, milk shakes, frozen yogurt, eggnog, nutritional supplements, ice cream, sherbet, regular or sugar-free gelatin, or any foods that become thin liquid at either room (70 F) or body temperature (98 F).  AVOID: Dry, coarse cakes and cookies. Anything with nuts, seeds, coconut, pineapple, or dried fruit. Breakfast yogurt with nuts. Rice or bread pudding.  FOOD GROUP: Fats. RECOMMENDED: Butter, margarine, cream for cereal (depending on liquid consistency recommendations), gravy, cream sauces, sour cream, sour cream dips with soft additives, mayonnaise, salad dressings, cream cheese, cream cheese spreads with soft additives, whipped toppings.  AVOID: All fats with coarse or chunky additives.  FOOD GROUP: Fruits. RECOMMENDED: Soft drained, canned, or cooked fruits without seeds or skin. Fresh soft and ripe banana. Fruit juices with a small amount of pulp. If thin liquids are restricted, fruit juices should be thickened to appropriate consistency.  AVOID: Fresh or frozen fruits. Cooked fruit with skin or seeds. Dried fruits. Fresh, canned, or cooked pineapple.  FOOD GROUP: Meats and Meat Substitutes. (Meat pieces should not exceed 1/4 of an inch cube and should be tender.) RECOMMENDED: Moistened ground or cooked meat, poultry, or fish. Moist ground or tender meat may be served with gravy or sauce. Casseroles without rice. Moist macaroni and cheese, well-cooked pasta with meat sauce, tuna noodle casserole, soft, moist  lasagna. Moist meatballs, meatloaf, or fish loaf. Protein salads, such as tuna or egg without large chunks, celery, or onion. Cottage cheese, smooth quiche without large chunks. Poached,  scrambled, or soft-cooked eggs (egg yolks should not be "runny" but should be moist and able to be mashed with butter, margarine, or other moisture added to them). (Cook eggs to 160 F or use pasteurized eggs for safety.) Souffls may have small, soft chunks. Tofu. Well-cooked, slightly mashed, moist legumes, such as baked beans. All meats or protein substitutes should be served with sauces or moistened to help maintain cohesiveness in the oral cavity.  AVOID: Dry meats, tough meats (such as bacon, sausage, hot dogs, bratwurst). Dry casseroles or casseroles with rice or large chunks. Peanut butter. Cheese slices and cubes. Hard-cooked or crisp fried eggs. Sandwiches.Pizza.  FOOD GROUP: Potatoes and Starches. RECOMMENDED: Well-cooked, moistened, boiled, baked, or mashed potatoes. Well-cooked shredded hash brown potatoes that are not crisp. (All potatoes need to be moist and in sauces.)Well-cooked noodles in sauce. Spaetzel or soft dumplings that have been moistened with butter or gravy.  AVOID: Potato skins and chips. Fried or French-fried potatoes. Rice.  FOOD GROUP: Soups. RECOMMENDED: Soups with easy-to-chew or easy-to-swallow meats or vegetables: Particle sizes in soups should be less than 1/2 inch. Soups will need to be thickened to appropriate consistency if soup is thinner than prescribed liquid consistency.  AVOID: Soups with large chunks of meat and vegetables. Soups with rice, corn, peas.  FOOD GROUP: Vegetables. RECOMMENDED: All soft, well-cooked vegetables. Vegetables should be less than a half inch. Should be easily mashed with a fork.  AVOID: Cooked corn and peas. Broccoli, cabbage, Brussels sprouts, asparagus, or other fibrous, non-tender or rubbery cooked vegetables.  FOOD GROUP: Miscellaneous. RECOMMENDED: Jams and preserves without seeds, jelly. Sauces, salsas, etc., that may have small tender chunks less than 1/2 inch. Soft, smooth chocolate bars that are easily  chewed.  AVOID: Seeds, nuts, coconut, or sticky foods. Chewy candies such as caramels or licorice.

## 2018-03-07 NOTE — Telephone Encounter (Signed)
REVIEWED-NO ADDITIONAL RECOMMENDATIONS. 

## 2018-03-07 NOTE — Assessment & Plan Note (Signed)
SYMPTOMS NOT IDEALLY CONTROLLED.  TO REDUCE ABDOMINAL PAIN: 1. FOLLOW LOW FAT/SOFT MECHANICAL DIET. NO FRIED FOOD. MEATS SHOULD BE BAKED, BROILED, OR BOILED FOR 2 WEEKS. SEE INFO BELOW. 2. CONTINUE [place]. TAKE 30 MINUTES PRIOR TO MEALS TWICE DAILY. 3. PEPCID TWICE DAILY MON-FRI FOR 2 WEEKS THE USE AS NEEDED. 4. USE VISCOUS LIDOCAINE OR PEPTO BISMOL WHEN NEEDED FOR ADDITIONAL RELIEF FOR PAIN.  SEE CARDIOLOGY TODAY.  PLEASE CALL IN ONE MONTH IF YOUR CONSTIPATION IS NOT BETTER.   FOLLOW UP IN 4 MOS.

## 2018-03-15 ENCOUNTER — Other Ambulatory Visit: Payer: Self-pay | Admitting: Nurse Practitioner

## 2018-03-20 ENCOUNTER — Other Ambulatory Visit: Payer: Self-pay | Admitting: Family Medicine

## 2018-03-20 ENCOUNTER — Telehealth: Payer: Self-pay

## 2018-03-20 DIAGNOSIS — R1010 Upper abdominal pain, unspecified: Secondary | ICD-10-CM

## 2018-03-20 NOTE — Telephone Encounter (Signed)
Pt said that she has had abdominal pain the entire month of October. She is hardly eating anything, because she is afraid to eat. Last BM last Thursday or Friday.  She contributes that to not eating much, because she is afraid to eat. She said her pain is more in the epigastric region, and she feels that she needs to burp, even when she has eaten.  She is so frustrated because this has been going on so long. Dr. Oneida Alar, please advise!

## 2018-03-21 ENCOUNTER — Telehealth: Payer: Self-pay | Admitting: Gastroenterology

## 2018-03-21 NOTE — Telephone Encounter (Signed)
Pt called and would like to know what she needs to do and should she see her PCP?

## 2018-03-21 NOTE — Telephone Encounter (Signed)
Patient called back from yesterday wondering if you had spoken to slf about her care.

## 2018-03-21 NOTE — Telephone Encounter (Signed)
PLEASE CALL PT. SHE HAS HAD AN EXTENSIVE GI WORKUP SINCE NOV 2018 TO INCLUDE EGD/TCS, GIVENS CAPSULE STUDY, AND CT ANGIO OF THE ABDOMEN AS RECENT AS JUN 2019. HER WORKUP REVEALED GASTRITIS/CONSTIPATION BOTH CAN CAUSE EPIGASTRIC PAIN. SHE DOES NOT HAVE CANCER OR BLOCKAGES IN THE ARTERIES IN HER ABDOMEN.  TO MAKE SURE SHE DOES NOT HAVE DIVERTICULITIS, WE CAN REPEAT HER CT ABD/PELVIS W/ IV AND ORAL CONTRAST, DX: ABDOMINAL PAIN. I WILL REFER HER TO [facility] BECAUSE WE HAVE BEEN UNABLE TO ALLEVIATE HER SYMPTOMS. IN THE MEANTIME, TO REDUCE ABDOMINAL PAIN:  1. FOLLOW LOW FAT/SOFT MECHANICAL DIET. NO FRIED FOOD. 2. CONTINUE [place]. TAKE 30 MINUTES PRIOR TO MEALS TWICE DAILY. 3. USE PEPCID TWICE DAILY MON-FRI FOR 2 WEEKS THE USE AS NEEDED. 4. USE VISCOUS LIDOCAINE OR PEPTO BISMOL WHEN NEEDED FOR ADDITIONAL RELIEF FOR PAIN.

## 2018-03-21 NOTE — Telephone Encounter (Signed)
See previous phone note.  

## 2018-03-21 NOTE — Telephone Encounter (Signed)
Called, many rings and no answer.

## 2018-03-21 NOTE — Telephone Encounter (Signed)
REVIEWED-NO ADDITIONAL RECOMMENDATIONS. 

## 2018-03-21 NOTE — Telephone Encounter (Signed)
PT is aware and OK to schedule the CT scan. Forwarding to [facility] to schedule.

## 2018-03-21 NOTE — Telephone Encounter (Signed)
See note from yesterday

## 2018-03-21 NOTE — Telephone Encounter (Signed)
PATIENT RETURNED CALL AND SAID SHE IS NOW AT HOME AND CAN ANSWER CALL

## 2018-03-21 NOTE — Telephone Encounter (Signed)
CT scheduled for 04/10/18 at 1:00pm, arrive at 12:45pm. NPO 4 hours prior to test. Pick up contrast before day of test. Called and informed pt of appt. Letter mailed.  Dr. Oneida Alar, she wants to know what CT results show before doing referral to [facility].

## 2018-03-22 NOTE — Telephone Encounter (Signed)
PA for CT submitted via [employer]. Approved. PA# [id number], 03/22/18-04/20/18.

## 2018-04-10 ENCOUNTER — Ambulatory Visit (HOSPITAL_COMMUNITY)
Admission: RE | Admit: 2018-04-10 | Discharge: 2018-04-10 | Disposition: A | Discharge status: Home / Self Care | Payer: Medicare Other | Source: Ambulatory Visit | Attending: Gastroenterology | Admitting: Gastroenterology

## 2018-04-10 DIAGNOSIS — N281 Cyst of kidney, acquired: Secondary | ICD-10-CM | POA: Insufficient documentation

## 2018-04-10 DIAGNOSIS — R1010 Upper abdominal pain, unspecified: Secondary | ICD-10-CM

## 2018-04-10 LAB — POCT I-STAT CREATININE: Creatinine, Ser: 0.7 mg/dL (ref 0.44–1.00)

## 2018-04-10 MED ORDER — IOPAMIDOL (ISOVUE-300) INJECTION 61%
100.0000 mL | Freq: Once | INTRAVENOUS | Status: AC | PRN
  Administered 2018-04-10: 100 mL via INTRAVENOUS

## 2018-04-11 NOTE — Progress Notes (Signed)
PT is aware. She really does not want a referral at this time. Said she will see how she does, and if she decides to go, she will let us know to send referral. I explained to her that when we do referral, it takes awhile for them to see her. She is aware and will let us know if she decides to do so.

## 2018-04-12 NOTE — Progress Notes (Signed)
CC'D TO PCP °

## 2018-04-16 ENCOUNTER — Telehealth: Payer: Self-pay | Admitting: Gastroenterology

## 2018-04-16 NOTE — Telephone Encounter (Signed)
PT said her Buspar 5 mg bid is not working now. Since last Thursday, she feels like it is just not working at all.  I told her Dr. Oneida Alar is off til Dec 4th, and she really needs to discuss anxiety with PCP. She Dr. Walden Field has left [place], and will not discuss any PCP needs with her until she sees her in Jan at the [facility] practice. I told her that I will ask Roseanne Kaufman, NP for any recommendations, but the extenders usually do not do anxiety meds.

## 2018-04-16 NOTE — Telephone Encounter (Signed)
LMOM to call.

## 2018-04-16 NOTE — Telephone Encounter (Signed)
[phone]  PATIENT CALLED AND SAID [facility] HAD PRESCRIBED HER SOMETHING FOR ANXIETY AND SHE WANTS TO KNOW IF SHE CAN INCREASE HER DOSAGE

## 2018-04-16 NOTE — Telephone Encounter (Signed)
I don't feel comfortable adjusting this. We can make a referral to psych in the meantime. May do well with counseling and adjustment of meds.

## 2018-04-17 NOTE — Telephone Encounter (Signed)
PT is aware.

## 2018-04-17 NOTE — Telephone Encounter (Signed)
PT is aware. She does not want a referral at this time.  Michela Pitcher she has a psych doctor that had surgery and is out until Jan. ( Dr. Mannie Stabile, NOT Dr. Walden Field is PCP that she will not see until Jan who has moved to [facility].  She said she went to [facility] yesterday, and saw one of the providers that use to be at [facility]. She was given Hydroxyzine 25 mg to take tid if needed in addition to the the same dosage of Buspar that she is on. She said she will just try that.  She will call back if any questions.

## 2018-04-17 NOTE — Telephone Encounter (Signed)
Great! The hydroxyzine is for anxiety. Make sure she knows it can cause drowsiness, so don't drive while taking it. Try at home first.

## 2018-05-02 ENCOUNTER — Inpatient Hospital Stay (HOSPITAL_COMMUNITY): Payer: Medicare Other | Attending: Hematology

## 2018-05-02 ENCOUNTER — Encounter

## 2018-05-02 ENCOUNTER — Encounter: Payer: Self-pay | Admitting: Gastroenterology

## 2018-05-02 ENCOUNTER — Ambulatory Visit (INDEPENDENT_AMBULATORY_CARE_PROVIDER_SITE_OTHER): Payer: Medicare Other | Admitting: Gastroenterology

## 2018-05-02 DIAGNOSIS — R002 Palpitations: Secondary | ICD-10-CM

## 2018-05-02 DIAGNOSIS — Z86711 Personal history of pulmonary embolism: Secondary | ICD-10-CM | POA: Diagnosis not present

## 2018-05-02 DIAGNOSIS — Z7982 Long term (current) use of aspirin: Secondary | ICD-10-CM | POA: Insufficient documentation

## 2018-05-02 DIAGNOSIS — D500 Iron deficiency anemia secondary to blood loss (chronic): Secondary | ICD-10-CM | POA: Insufficient documentation

## 2018-05-02 DIAGNOSIS — K581 Irritable bowel syndrome with constipation: Secondary | ICD-10-CM | POA: Diagnosis not present

## 2018-05-02 DIAGNOSIS — K219 Gastro-esophageal reflux disease without esophagitis: Secondary | ICD-10-CM

## 2018-05-02 DIAGNOSIS — Z7901 Long term (current) use of anticoagulants: Secondary | ICD-10-CM | POA: Insufficient documentation

## 2018-05-02 DIAGNOSIS — Z79899 Other long term (current) drug therapy: Secondary | ICD-10-CM | POA: Diagnosis not present

## 2018-05-02 DIAGNOSIS — I10 Essential (primary) hypertension: Secondary | ICD-10-CM | POA: Insufficient documentation

## 2018-05-02 LAB — CBC WITH DIFFERENTIAL/PLATELET
Abs Immature Granulocytes: 0.01 10*3/uL (ref 0.00–0.07)
Basophils Absolute: 0 10*3/uL (ref 0.0–0.1)
Basophils Relative: 1 %
Eosinophils Absolute: 0.2 10*3/uL (ref 0.0–0.5)
Eosinophils Relative: 3 %
HCT: 37.9 % (ref 36.0–46.0)
Hemoglobin: 12 g/dL (ref 12.0–15.0)
Immature Granulocytes: 0 %
Lymphocytes Relative: 31 %
Lymphs Abs: 1.9 10*3/uL (ref 0.7–4.0)
MCH: 30.5 pg (ref 26.0–34.0)
MCHC: 31.7 g/dL (ref 30.0–36.0)
MCV: 96.4 fL (ref 80.0–100.0)
Monocytes Absolute: 0.5 10*3/uL (ref 0.1–1.0)
Monocytes Relative: 8 %
Neutro Abs: 3.6 10*3/uL (ref 1.7–7.7)
Neutrophils Relative %: 57 %
Platelets: 151 10*3/uL (ref 150–400)
RBC: 3.93 MIL/uL (ref 3.87–5.11)
RDW: 14.5 % (ref 11.5–15.5)
WBC: 6.3 10*3/uL (ref 4.0–10.5)
nRBC: 0 % (ref 0.0–0.2)

## 2018-05-02 LAB — COMPREHENSIVE METABOLIC PANEL
ALT: 25 U/L (ref 0–44)
AST: 37 U/L (ref 15–41)
Albumin: 4 g/dL (ref 3.5–5.0)
Alkaline Phosphatase: 58 U/L (ref 38–126)
Anion gap: 9 (ref 5–15)
BUN: 16 mg/dL (ref 8–23)
CO2: 26 mmol/L (ref 22–32)
Calcium: 9 mg/dL (ref 8.9–10.3)
Chloride: 102 mmol/L (ref 98–111)
Creatinine, Ser: 0.83 mg/dL (ref 0.44–1.00)
GFR calc Af Amer: >60 mL/min (ref >60)
GFR calc non Af Amer: >60 mL/min (ref >60)
Glucose, Bld: 110 mg/dL — ABNORMAL HIGH (ref 70–99)
Potassium: 3.2 mmol/L — ABNORMAL LOW (ref 3.5–5.1)
Sodium: 137 mmol/L (ref 135–145)
Total Bilirubin: 0.8 mg/dL (ref 0.3–1.2)
Total Protein: 8.7 g/dL — ABNORMAL HIGH (ref 6.5–8.1)

## 2018-05-02 LAB — LACTATE DEHYDROGENASE: LDH: 182 U/L (ref 98–192)

## 2018-05-02 LAB — FERRITIN: Ferritin: 215 ng/mL (ref 11–307)

## 2018-05-02 LAB — TSH: TSH: 0.644 u[IU]/mL (ref 0.350–4.500)

## 2018-05-02 NOTE — Patient Instructions (Addendum)
DRINK WATER TO KEEP YOUR URINE LIGHT YELLOW.  FOLLOW A HIGH FIBER DIET. AVOID ITEMS THAT CAUSE BLOATING & GAS.   USE MIRALAX UP TO THREE TIMES A DAY TO AVOID CONSTIPATION. Other options include: SENNA-LAX OR COLACE WITH A STIMULANT LAXATIVE UP TO THREE TIMES A DAY.  CONTINUE PROTONIX. TAKE 30 MINUTES PRIOR TO MEALS TWICE DAILY.  CONTINUE PROBIOTIC DAILY.  USE VISCOUS LIDOCAINE WHEN NEEDED FOR UPPER ABDOMINAL PAIN OR HEARTBURN.  FOLLOW UP IN 6 MOS.

## 2018-05-02 NOTE — Assessment & Plan Note (Signed)
SYMPTOMS FAIRLY WELL CONTROLLED.   CONTINUE PROTONIX. TAKE 30 MINUTES PRIOR TO MEALS TWICE DAILY. USE VISCOUS LIDOCAINE PRN ABDOMINAL PAIN. FOLLOW UP IN 6 MOS.

## 2018-05-02 NOTE — Assessment & Plan Note (Signed)
SYMPTOMS FAIRLY WELL CONTROLLED.  DRINK WATER TO KEEP YOUR URINE LIGHT YELLOW. FOLLOW A HIGH FIBER DIET. AVOID ITEMS THAT CAUSE BLOATING & GAS. USE MIRALAX UP TO THREE TIMES A DAY TO AVOID CONSTIPATION. Other options include: SENNA-LAX OR COLACE WITH A STIMULANT LAXATIVE UP TO THREE TIMES A DAY. CONTINUE PROBIOTIC DAILY. FOLLOW UP IN 6 MOS.

## 2018-05-02 NOTE — Progress Notes (Signed)
Subjective:    Patient ID: Courtney Rogers, female    DOB: May 18, 1951, 67 y.o.   MRN: [id number]  No primary care provider on file.  HPI Stomach is OK NOW. RARE HAS A LITTLE ABDOMINAL PAIN. MAY BE FROM CONSTIPATION TYPE STUFF. BMs: 2X/DAY AND NOW ONCE A DAY AND TWICE TOMORROW.  PT DENIES FEVER, CHILLS, HEMATOCHEZIA, HEMATEMESIS, nausea, vomiting, melena, diarrhea, CHEST PAIN, SHORTNESS OF BREATH, CHANGE IN BOWEL IN HABITS,  problems swallowing, problems with sedation, heartburn or indigestion.  Past Medical History:  Diagnosis Date  . Acute myocardial infarction [facility]) 2009   CAD/no stent medically managed  . Anxiety disorder   . CAD (coronary artery disease)   . Hyperlipidemia   . Hypertension   . IBS (irritable bowel syndrome)   . Overactive bladder   . PE (pulmonary embolism) JUN 2014  . Presence of permanent cardiac pacemaker   . Sleep apnea     Past Surgical History:  Procedure Laterality Date  . ABDOMINAL HYSTERECTOMY    . BSO secondary to cyst    . CARDIAC CATHETERIZATION    . COLONOSCOPY  12/2009   Dr. West Carbo, propofol, normal. Next TCS 12/2019  . COLONOSCOPY N/A 05/22/2017   Procedure: COLONOSCOPY;  Surgeon: Danie Binder, MD;  [facility];  Service: Endoscopy;  Laterality: N/A;  10:30am  . ESOPHAGOGASTRODUODENOSCOPY  05/11/09   schatzki ring/small hiatal hernia/path:gastritis  . ESOPHAGOGASTRODUODENOSCOPY N/A 05/22/2017   Procedure: ESOPHAGOGASTRODUODENOSCOPY (EGD);  Surgeon: Danie Binder, MD;  [facility];  Service: Endoscopy;  Laterality: N/A;  . ESOPHAGOGASTRODUODENOSCOPY (EGD) WITH ESOPHAGEAL DILATION N/A 04/01/2013   [id number] at the gastroesophageal juction/multiple small polyps/mild gastritis  . GIVENS CAPSULE STUDY N/A 06/07/2017   Procedure: GIVENS CAPSULE STUDY;  Surgeon: Danie Binder, MD;  [facility];  Service: Endoscopy;  Laterality: N/A;  7:30am  . INSERT / REPLACE / REMOVE PACEMAKER     Last year  per pt.(can't remember date)    Allergies  Allergen Reactions  . Biaxin [Clarithromycin] Other (See Comments)    Stomach problems   . Lisinopril Swelling    Current Outpatient Medications  Medication Sig Dispense Refill  . acetaminophen (TYLENOL) 325 MG tablet Take 2 (two) times daily as needed for moderate pain or headache.    . albuterol (PROVENTIL HFA;VENTOLIN HFA) 108 (90 BASE) MCG/ACT inhaler Inhale 2 puffs into the lungs every 6 (six) hours as needed for shortness of breath.    Marland Kitchen amLODipine (NORVASC) 5 MG tablet Take 1 tablet (5 mg total) by mouth daily.    Marland Kitchen aspirin EC 81 MG tablet Take 81 mg by mouth at bedtime.    Marland Kitchen b complex vitamins tablet Take 1 tablet by mouth daily.    Marland Kitchen bismuth subsalicylate (PEPTO BISMOL) 262 MG/15ML suspension Take 30 mLs by mouth every 6 (six) hours as needed.    . busPIRone (BUSPAR) 5 MG tablet Take 5 mg by mouth 2 (two) times daily.    . Calcium Carbonate-Vitamin D (CALCIUM 600+D PO) Take 1 tablet by mouth 2 (two) times daily.    . chlorthalidone (HYGROTON) 25 MG tablet Take 25 mg by mouth daily.     . Cyanocobalamin (B-12) 2500 MCG TABS Take 1 capsule by mouth daily.    . fluticasone (FLONASE) 50 MCG/ACT nasal spray SPRAY 2 SPRAYS INTO EACH NOSTRIL ONCE DAILY    . lidocaine (XYLOCAINE) 2 % solution TAKE 2 TEASPOONFULS BEFORE MEALS AND AT BEDTIME AS NEEDED MAY REPEAT EVERY  4 HOURS. (MAX OF 8 DOSES PER DAY) ONCE A DAY   . losartan (COZAAR) 100 MG tablet Take 100 mg by mouth daily.    . metoprolol succinate (TOPROL-XL) 50 MG 24 hr tablet Take 75 mg by mouth daily.     . montelukast (SINGULAIR) 10 MG tablet Take 10 mg by mouth daily.     . nitroGLYCERIN (NITROSTAT) 0.4 MG SL tablet Place 0.4 mg under the tongue every 5 (five) minutes as needed for chest pain.     . pantoprazole (PROTONIX) 40 MG tablet Take 1 tablet (40 mg total) by mouth 2 (two) times daily before a meal. EVERY DAY   . Phenylephrine-DM-GG-APAP (MUCINEX FAST-MAX COLD FLU PO) Take 1 tablet  by mouth every 4 (four) hours as needed (sinus infection).    . polyethylene glycol (MIRALAX / GLYCOLAX) packet Take 17 g by mouth daily as needed for mild constipation or moderate constipation.     . potassium chloride (K-DUR,KLOR-CON) 10 MEQ tablet Take 40 mEq by mouth 2 (two) times daily.     . Probiotic Product (PROBIOTIC PO) Take 1 capsule by mouth daily. [facility].    . sertraline (ZOLOFT) 100 MG tablet Take 200 mg by mouth daily.     . Simethicone (GAS-X PO) Take 2 tablets by mouth daily as needed (gas).    . simvastatin (ZOCOR) 40 MG tablet Take 40 mg by mouth at bedtime.      . vitamin C (ASCORBIC ACID) 500 MG tablet Take 500 mg by mouth daily.    Marland Kitchen warfarin (COUMADIN) 1 MG tablet Take 4 mg by mouth daily.     .       Review of Systems PER HPI OTHERWISE ALL SYSTEMS ARE NEGATIVE.    Objective:   Physical Exam  Constitutional: She is oriented to person, place, and time. She appears well-developed and well-nourished. No distress.  HENT:  Head: Normocephalic and atraumatic.  Mouth/Throat: Oropharynx is clear and moist. No oropharyngeal exudate.  Eyes: Pupils are equal, round, and reactive to light. No scleral icterus.  Neck: Normal range of motion. Neck supple.  Cardiovascular: Normal rate, regular rhythm and normal heart sounds.  Pulmonary/Chest: Effort normal and breath sounds normal. No respiratory distress.  Abdominal: Soft. Bowel sounds are normal. She exhibits no distension. There is tenderness (MILD LLQ). There is no rebound and no guarding.  Musculoskeletal: She exhibits no edema.  Lymphadenopathy:    She has no cervical adenopathy.  Neurological: She is alert and oriented to person, place, and time.  NO NEW FOCAL DEFICITS  Psychiatric: She has a normal mood and affect.  Vitals reviewed.     Assessment & Plan:

## 2018-05-03 NOTE — Progress Notes (Signed)
ON RECALL  °

## 2018-05-04 ENCOUNTER — Ambulatory Visit (HOSPITAL_COMMUNITY): Payer: Medicare Other | Admitting: Internal Medicine

## 2018-05-08 ENCOUNTER — Inpatient Hospital Stay (HOSPITAL_BASED_OUTPATIENT_CLINIC_OR_DEPARTMENT_OTHER): Payer: Medicare Other | Admitting: Internal Medicine

## 2018-05-08 ENCOUNTER — Encounter (HOSPITAL_COMMUNITY): Payer: Self-pay | Admitting: Internal Medicine

## 2018-05-08 VITALS — BP 125/80 | HR 90 | Temp 98.2°F | Resp 18 | Wt 169.0 lb

## 2018-05-08 DIAGNOSIS — I10 Essential (primary) hypertension: Secondary | ICD-10-CM | POA: Diagnosis not present

## 2018-05-08 DIAGNOSIS — Z86711 Personal history of pulmonary embolism: Secondary | ICD-10-CM | POA: Diagnosis not present

## 2018-05-08 DIAGNOSIS — Z79899 Other long term (current) drug therapy: Secondary | ICD-10-CM

## 2018-05-08 DIAGNOSIS — Z7901 Long term (current) use of anticoagulants: Secondary | ICD-10-CM | POA: Diagnosis not present

## 2018-05-08 DIAGNOSIS — D500 Iron deficiency anemia secondary to blood loss (chronic): Secondary | ICD-10-CM | POA: Diagnosis not present

## 2018-05-08 DIAGNOSIS — Z7982 Long term (current) use of aspirin: Secondary | ICD-10-CM

## 2018-05-08 NOTE — Progress Notes (Signed)
Diagnosis Iron deficiency anemia due to chronic blood loss - Plan: CBC with Differential/Platelet, Comprehensive metabolic panel, Lactate dehydrogenase, Ferritin  Staging Cancer Staging No matching staging information was found for the patient.  Assessment and Plan:   1.  Iron deficiency anemia.  Patient was initially seen by Dr. Sherrine Maples.  Pt was treated with 1 dose of Injectafer on 08/07/2017.     Labs done 05/02/2018 reviewed and showed WBC 6.3 HB 12 plts 151,000.  Chemistries WNL with K+ 3.2 Cr 0.83 and normal LFTs.  Ferritin 215.  Pt will RTC in 10/2018 with labs and follow-up.    2.  PE.  Patient remains on  Coumadin with INR monitoring through PCP.    3.  HTN.  BP is 125/80.  Follow-up with PCP.    4.  Health maintenance.  GI and mammogram screening as recommended.   Interval History:   Historical data obtained from the note dated 08/21/2017.  67 year old lady who is referred by Dr. Barney Drain for lower Gi bleed noted in 2018. Patient also has a long standing history of dyspepsia. She was found to have a decline in hgb gradually since 11/2016 from about 10 to 9 recently. Ferritin was noted to be low confirming Iron deficiency. Patient underwent extensive GI work up to identify source of bleed, without any significant source  Current Status: She is seen today for follow-up.  She is to go over labs.    Problem List Patient Active Problem List   Diagnosis Date Noted  . Essential hypertension [I10] 12/14/2017  . Iron deficiency anemia [D50.9] 08/03/2017  . Dyspepsia [R10.13]   . Coccydynia [M53.3] 04/19/2017  . Normocytic anemia [D64.9] 01/12/2017  . Fatty liver [K76.0] 11/03/2015  . Anxiety state [F41.1] 11/03/2015  . Weight loss [R63.4] 04/05/2011  . GERD [K21.9] 06/01/2010  . IRRITABLE BOWEL SYNDROME [K58.9] 07/21/2009  . Non-ulcer dyspepsia [K30] 04/30/2009    Past Medical History Past Medical History:  Diagnosis Date  . Acute myocardial infarction [facility]) 2009   CAD/no  stent medically managed  . Anxiety disorder   . CAD (coronary artery disease)   . Hyperlipidemia   . Hypertension   . IBS (irritable bowel syndrome)   . Overactive bladder   . PE (pulmonary embolism) JUN 2014  . Presence of permanent cardiac pacemaker   . Sleep apnea     Past Surgical History Past Surgical History:  Procedure Laterality Date  . ABDOMINAL HYSTERECTOMY    . BSO secondary to cyst    . CARDIAC CATHETERIZATION    . COLONOSCOPY  12/2009   Dr. West Carbo, propofol, normal. Next TCS 12/2019  . COLONOSCOPY N/A 05/22/2017   Procedure: COLONOSCOPY;  Surgeon: Danie Binder, MD;  [facility];  Service: Endoscopy;  Laterality: N/A;  10:30am  . ESOPHAGOGASTRODUODENOSCOPY  05/11/09   schatzki ring/small hiatal hernia/path:gastritis  . ESOPHAGOGASTRODUODENOSCOPY N/A 05/22/2017   Procedure: ESOPHAGOGASTRODUODENOSCOPY (EGD);  Surgeon: Danie Binder, MD;  [facility];  Service: Endoscopy;  Laterality: N/A;  . ESOPHAGOGASTRODUODENOSCOPY (EGD) WITH ESOPHAGEAL DILATION N/A 04/01/2013   [id number] at the gastroesophageal juction/multiple small polyps/mild gastritis  . GIVENS CAPSULE STUDY N/A 06/07/2017   Procedure: GIVENS CAPSULE STUDY;  Surgeon: Danie Binder, MD;  [facility];  Service: Endoscopy;  Laterality: N/A;  7:30am  . INSERT / REPLACE / REMOVE PACEMAKER     Last year per pt.(can't remember date)    Family History Family History  Problem Relation Age of Onset  .  Colon polyps Neg Hx   . Colon cancer Neg Hx      Social History  reports that she has never smoked. She has never used smokeless tobacco. She reports that she does not drink alcohol or use drugs.  Medications  Current Outpatient Medications:  .  acetaminophen (TYLENOL) 500 MG tablet, Take 1,000 mg by mouth 2 (two) times daily as needed for moderate pain or headache., Disp: , Rfl:  .  albuterol (PROVENTIL HFA;VENTOLIN HFA) 108 (90 BASE) MCG/ACT inhaler, Inhale 2 puffs  into the lungs every 6 (six) hours as needed for shortness of breath., Disp: , Rfl:  .  amLODipine (NORVASC) 5 MG tablet, Take 1 tablet (5 mg total) by mouth daily., Disp: 90 tablet, Rfl: 0 .  aspirin EC 81 MG tablet, Take 81 mg by mouth at bedtime., Disp: , Rfl:  .  b complex vitamins tablet, Take 1 tablet by mouth daily., Disp: , Rfl:  .  bismuth subsalicylate (PEPTO BISMOL) 262 MG/15ML suspension, Take 30 mLs by mouth every 6 (six) hours as needed., Disp: , Rfl:  .  busPIRone (BUSPAR) 5 MG tablet, Take 5 mg by mouth 2 (two) times daily., Disp: , Rfl:  .  Calcium Carbonate-Vitamin D (CALCIUM 600+D PO), Take 1 tablet by mouth 2 (two) times daily., Disp: , Rfl:  .  cefdinir (OMNICEF) 300 MG capsule, , Disp: , Rfl: 0 .  chlorthalidone (HYGROTON) 25 MG tablet, Take 25 mg by mouth daily. , Disp: , Rfl:  .  Cyanocobalamin (B-12) 2500 MCG TABS, Take 1 capsule by mouth daily., Disp: , Rfl:  .  doxycycline (VIBRAMYCIN) 100 MG capsule, , Disp: , Rfl: 0 .  famotidine (PEPCID) 20 MG tablet, Take 1 tablet (20 mg total) by mouth 2 (two) times daily. (Patient not taking: Reported on 05/02/2018), Disp: 30 tablet, Rfl: 0 .  fluticasone (FLONASE) 50 MCG/ACT nasal spray, SPRAY 2 SPRAYS INTO EACH NOSTRIL ONCE DAILY, Disp: 16 g, Rfl: 0 .  gabapentin (NEURONTIN) 600 MG tablet, Take 600 mg by mouth 2 (two) times daily. , Disp: , Rfl: 4 .  hydrOXYzine (ATARAX/VISTARIL) 50 MG tablet, , Disp: , Rfl: 0 .  ipratropium-albuterol (DUONEB) 0.5-2.5 (3) MG/3ML SOLN, ipratropium 0.5 mg-albuterol 3 mg (2.5 mg base)/3 mL nebulization soln, Disp: , Rfl:  .  lidocaine (XYLOCAINE) 2 % solution, TAKE 2 TEASPOONFULS BEFORE MEALS AND AT BEDTIME AS NEEDED MAY REPEAT EVERY 4 HOURS. (MAX OF 8 DOSES PER DAY), Disp: 300 mL, Rfl: 1 .  losartan (COZAAR) 100 MG tablet, Take 100 mg by mouth daily., Disp: , Rfl:  .  metoprolol succinate (TOPROL-XL) 50 MG 24 hr tablet, Take 75 mg by mouth daily. , Disp: , Rfl:  .  montelukast (SINGULAIR) 10 MG  tablet, Take 10 mg by mouth daily. , Disp: , Rfl:  .  nitroGLYCERIN (NITROSTAT) 0.4 MG SL tablet, Place 0.4 mg under the tongue every 5 (five) minutes as needed for chest pain. , Disp: , Rfl:  .  pantoprazole (PROTONIX) 40 MG tablet, Take 1 tablet (40 mg total) by mouth 2 (two) times daily before a meal., Disp: 60 tablet, Rfl: 11 .  Phenylephrine-DM-GG-APAP (MUCINEX FAST-MAX COLD FLU PO), Take 1 tablet by mouth every 4 (four) hours as needed (sinus infection)., Disp: , Rfl:  .  polyethylene glycol (MIRALAX / GLYCOLAX) packet, Take 17 g by mouth daily as needed for mild constipation or moderate constipation. , Disp: , Rfl:  .  potassium chloride (K-DUR,KLOR-CON) 10 MEQ tablet, Take  40 mEq by mouth 2 (two) times daily. , Disp: , Rfl:  .  Probiotic Product (PROBIOTIC PO), Take 1 capsule by mouth daily. [facility]., Disp: , Rfl:  .  sertraline (ZOLOFT) 100 MG tablet, Take 200 mg by mouth daily. , Disp: , Rfl:  .  Simethicone (GAS-X PO), Take 2 tablets by mouth daily as needed (gas)., Disp: , Rfl:  .  simvastatin (ZOCOR) 40 MG tablet, Take 40 mg by mouth at bedtime.  , Disp: , Rfl:  .  sucralfate (CARAFATE) 1 g tablet, Take 1 tablet (1 g total) by mouth 4 (four) times daily -  with meals and at bedtime. (Patient not taking: Reported on 03/07/2018), Disp: 30 tablet, Rfl: 0 .  vitamin C (ASCORBIC ACID) 500 MG tablet, Take 500 mg by mouth daily., Disp: , Rfl:  .  warfarin (COUMADIN) 1 MG tablet, Take 4 mg by mouth daily. , Disp: , Rfl:  .  warfarin (COUMADIN) 3 MG tablet, Take 3 mg by mouth daily. , Disp: , Rfl: 2 .  warfarin (COUMADIN) 4 MG tablet, , Disp: , Rfl: 3  Allergies Biaxin [clarithromycin] and Lisinopril  Review of Systems Review of Systems - Oncology ROS negative   Physical Exam  Vitals Wt Readings from Last 3 Encounters:  05/08/18 169 lb (76.7 kg)  05/02/18 169 lb 3.2 oz (76.7 kg)  03/07/18 174 lb 6.4 oz (79.1 kg)   Temp Readings from Last 3 Encounters:  05/08/18 98.2  F (36.8 C) (Oral)  05/02/18 (!) 97.5 F (36.4 C) (Oral)  03/07/18 98.5 F (36.9 C) (Oral)   BP Readings from Last 3 Encounters:  05/08/18 125/80  05/02/18 113/65  03/07/18 139/80   Pulse Readings from Last 3 Encounters:  05/08/18 90  05/02/18 78  03/07/18 84   Constitutional: Well-developed, well-nourished, and in no distress.   HENT: Head: Normocephalic and atraumatic.  Mouth/Throat: No oropharyngeal exudate. Mucosa moist. Eyes: Pupils are equal, round, and reactive to light. Conjunctivae are normal. No scleral icterus.  Neck: Normal range of motion. Neck supple. No JVD present.  Cardiovascular: Normal rate, regular rhythm and normal heart sounds.  Exam reveals no gallop and no friction rub.   No murmur heard. Pulmonary/Chest: Effort normal and breath sounds normal. No respiratory distress. No wheezes.No rales.  Abdominal: Soft. Bowel sounds are normal. No distension. There is no tenderness. There is no guarding.  Musculoskeletal: No edema or tenderness.  Lymphadenopathy: No cervical, axillary or supraclavicular adenopathy.  Neurological: Alert and oriented to person, place, and time. No cranial nerve deficit.  Skin: Skin is warm and dry. No rash noted. No erythema. No pallor.  Psychiatric: Affect and judgment normal.   Labs No visits with results within 3 Day(s) from this visit.  Latest known visit with results is:  Appointment on 05/02/2018  Component Date Value Ref Range Status  . WBC 05/02/2018 6.3  4.0 - 10.5 K/uL Final  . RBC 05/02/2018 3.93  3.87 - 5.11 MIL/uL Final  . Hemoglobin 05/02/2018 12.0  12.0 - 15.0 g/dL Final  . HCT 05/02/2018 37.9  36.0 - 46.0 % Final  . MCV 05/02/2018 96.4  80.0 - 100.0 fL Final  . MCH 05/02/2018 30.5  26.0 - 34.0 pg Final  . MCHC 05/02/2018 31.7  30.0 - 36.0 g/dL Final  . RDW 05/02/2018 14.5  11.5 - 15.5 % Final  . Platelets 05/02/2018 151  150 - 400 K/uL Final  . nRBC 05/02/2018 0.0  0.0 - 0.2 % Final  .  Neutrophils Relative %  05/02/2018 57  % Final  . Neutro Abs 05/02/2018 3.6  1.7 - 7.7 K/uL Final  . Lymphocytes Relative 05/02/2018 31  % Final  . Lymphs Abs 05/02/2018 1.9  0.7 - 4.0 K/uL Final  . Monocytes Relative 05/02/2018 8  % Final  . Monocytes Absolute 05/02/2018 0.5  0.1 - 1.0 K/uL Final  . Eosinophils Relative 05/02/2018 3  % Final  . Eosinophils Absolute 05/02/2018 0.2  0.0 - 0.5 K/uL Final  . Basophils Relative 05/02/2018 1  % Final  . Basophils Absolute 05/02/2018 0.0  0.0 - 0.1 K/uL Final  . Immature Granulocytes 05/02/2018 0  % Final  . Abs Immature Granulocytes 05/02/2018 0.01  0.00 - 0.07 K/uL Final   Performed at [facility], [address] [place], [place] [address]  . Sodium 05/02/2018 137  135 - 145 mmol/L Final  . Potassium 05/02/2018 3.2* 3.5 - 5.1 mmol/L Final  . Chloride 05/02/2018 102  98 - 111 mmol/L Final  . CO2 05/02/2018 26  22 - 32 mmol/L Final  . Glucose, Bld 05/02/2018 110* 70 - 99 mg/dL Final  . BUN 05/02/2018 16  8 - 23 mg/dL Final  . Creatinine, Ser 05/02/2018 0.83  0.44 - 1.00 mg/dL Final  . Calcium 05/02/2018 9.0  8.9 - 10.3 mg/dL Final  . Total Protein 05/02/2018 8.7* 6.5 - 8.1 g/dL Final  . Albumin 05/02/2018 4.0  3.5 - 5.0 g/dL Final  . AST 05/02/2018 37  15 - 41 U/L Final  . ALT 05/02/2018 25  0 - 44 U/L Final  . Alkaline Phosphatase 05/02/2018 58  38 - 126 U/L Final  . Total Bilirubin 05/02/2018 0.8  0.3 - 1.2 mg/dL Final  . GFR calc non Af Amer 05/02/2018 >60  >60 mL/min Final  . GFR calc Af Amer 05/02/2018 >60  >60 mL/min Final  . Anion gap 05/02/2018 9  5 - 15 Final   Performed at [facility], [address] [place], [place] [address]  . LDH 05/02/2018 182  98 - 192 U/L Final   Performed at [facility], [address], [place], [place] [address]  . Ferritin 05/02/2018 215  11 - 307 ng/mL Final   Performed at [facility], [address], [place], [place] [address]  . TSH 05/02/2018 0.644  0.350 - 4.500 uIU/mL Final   Comment: Performed by a 3rd Generation  assay with a functional sensitivity of <=0.01 uIU/mL. Performed at [facility], [address] [place], [place] [address]      Pathology Orders Placed This Encounter  Procedures  . CBC with Differential/Platelet    Standing Status:   Future    Standing Expiration Date:   05/08/2020  . Comprehensive metabolic panel    Standing Status:   Future    Standing Expiration Date:   05/08/2020  . Lactate dehydrogenase    Standing Status:   Future    Standing Expiration Date:   05/08/2020  . Ferritin    Standing Status:   Future    Standing Expiration Date:   05/08/2020       Zoila Shutter MD

## 2018-05-08 NOTE — Patient Instructions (Signed)
[facility] Discharge Instructions  You were seen by Dr. Higgs today   Thank you for choosing [facility] to provide your oncology and hematology care.  To afford each patient quality time with our provider, please arrive at least 15 minutes before your scheduled appointment time.   If you have a lab appointment with the Cancer Center please come in thru the  Main Entrance and check in at the main information desk  You need to re-schedule your appointment should you arrive 10 or more minutes late.  We strive to give you quality time with our providers, and arriving late affects you and other patients whose appointments are after yours.  Also, if you no show three or more times for appointments you may be dismissed from the clinic at the providers discretion.     Again, thank you for choosing [facility].  Our hope is that these requests will decrease the amount of time that you wait before being seen by our physicians.       _____________________________________________________________  Should you have questions after your visit to [facility], please contact our office at [phone] between the hours of 8:00 a.m. and 4:30 p.m.  Voicemails left after 4:00 p.m. will not be returned until the following business day.  For prescription refill requests, have your pharmacy contact our office and allow 72 hours.    Cancer Center Support Programs:   > Cancer Support Group  2nd Tuesday of the month 1pm-2pm, Journey Room   

## 2018-05-21 ENCOUNTER — Other Ambulatory Visit: Payer: Self-pay | Admitting: Family Medicine

## 2018-05-21 DIAGNOSIS — I10 Essential (primary) hypertension: Secondary | ICD-10-CM

## 2018-06-01 ENCOUNTER — Other Ambulatory Visit (HOSPITAL_COMMUNITY): Payer: Self-pay | Admitting: Family Medicine

## 2018-06-06 ENCOUNTER — Encounter: Payer: Self-pay | Admitting: Gastroenterology

## 2018-09-18 NOTE — Progress Notes (Signed)
REVIEWED-NO ADDITIONAL RECOMMENDATIONS. 

## 2018-09-19 ENCOUNTER — Other Ambulatory Visit: Payer: Self-pay

## 2018-09-19 ENCOUNTER — Ambulatory Visit (INDEPENDENT_AMBULATORY_CARE_PROVIDER_SITE_OTHER): Payer: Medicare Other | Admitting: Gastroenterology

## 2018-09-19 ENCOUNTER — Encounter: Payer: Self-pay | Admitting: Gastroenterology

## 2018-09-19 DIAGNOSIS — K581 Irritable bowel syndrome with constipation: Secondary | ICD-10-CM

## 2018-09-19 DIAGNOSIS — K219 Gastro-esophageal reflux disease without esophagitis: Secondary | ICD-10-CM | POA: Diagnosis not present

## 2018-09-19 NOTE — Progress Notes (Signed)
Subjective:    Patient ID: Courtney Rogers, female    DOB: 1950-06-22, 68 y.o.   MRN: [id number]   Primary Care Physician:  [facility]  Primary GI:  Barney Drain, MD   Patient Location: home   Provider Location: [place] office   Reason for Visit: CONSTIPATION/GERD   Persons present on the virtual encounter, with roles: patient, myself (provider), MARTINA BOOTH CMA (update meds/allergies)   Total time (minutes) spent on medical discussion: 10 MINUTES   Due to COVID-19, visit was VIA TELEPHONE VISIT DUE TO COVID 19. VISIT IS CONDUCTED VIRTUALLY AND WAS REQUESTED BY PATIENT.   Virtual Visit via TELEPHONE   I connected with Anneth Carneiro and verified that I am speaking with the correct person using two identifiers.   I discussed the limitations, risks, security and privacy concerns of performing an evaluation and management service by telephone/video and the availability of in person appointments. I also discussed with the patient that there may be a patient responsible charge related to this service. The patient expressed understanding and agreed to proceed.  HPI PT HAVING OCCASION TROUBLE WITH CONSTIPATION AND IT'S ASSOCIATED WITH CRAMPY ABDOMINAL PAIN. USING MIRALAX IN AM WITH HER COFFEE. HAS MOM BUT NOT USING IT. HAD A FLARE OF HEARTBURN 1.5 WEEKS AND USED PEPCID AND VISCOUS LIDOCAINE. NO KNOWN TRIGGER FOR THE FLARE. 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   PT DENIES FEVER, CHILLS, HEMATOCHEZIA, HEMATEMESIS, nausea, vomiting, melena, diarrhea, CHEST PAIN, SHORTNESS OF BREATH,  CHANGE IN BOWEL IN HABITS, OR problems swallowing.  Past Medical History:  Diagnosis Date  . Acute myocardial infarction [facility]) 2009   CAD/no stent medically managed  . Anxiety disorder   . CAD (coronary artery disease)   . Hyperlipidemia   . Hypertension   . IBS (irritable bowel syndrome)   . Overactive bladder   . PE (pulmonary  embolism) JUN 2014  . Presence of permanent cardiac pacemaker   . Sleep apnea    Past Surgical History:  Procedure Laterality Date  . ABDOMINAL HYSTERECTOMY    . BSO secondary to cyst    . CARDIAC CATHETERIZATION    . COLONOSCOPY  12/2009   Dr. West Carbo, propofol, normal. Next TCS 12/2019  . COLONOSCOPY N/A 05/22/2017   Procedure: COLONOSCOPY;  Surgeon: Danie Binder, MD;  [facility];  Service: Endoscopy;  Laterality: N/A;  10:30am  . ESOPHAGOGASTRODUODENOSCOPY  05/11/09   schatzki ring/small hiatal hernia/path:gastritis  . ESOPHAGOGASTRODUODENOSCOPY N/A 05/22/2017   Procedure: ESOPHAGOGASTRODUODENOSCOPY (EGD);  Surgeon: Danie Binder, MD;  [facility];  Service: Endoscopy;  Laterality: N/A;  . ESOPHAGOGASTRODUODENOSCOPY (EGD) WITH ESOPHAGEAL DILATION N/A 04/01/2013   YQM:[id number] at the gastroesophageal juction/multiple small polyps/mild gastritis  . GIVENS CAPSULE STUDY N/A 06/07/2017   Procedure: GIVENS CAPSULE STUDY;  Surgeon: Danie Binder, MD;  [facility];  Service: Endoscopy;  Laterality: N/A;  7:30am  . INSERT / REPLACE / REMOVE PACEMAKER     Last year per pt.(can't remember date)   Allergies  Allergen Reactions  . Biaxin [Clarithromycin] Other (See Comments)    Stomach problems   . Lisinopril Swelling   Current Outpatient Medications  Medication Sig    . acetaminophen (TYLENOL) 500 MG tablet Take 1,000 mg by mouth 2 (two) times daily as needed for moderate pain or headache.    . albuterol (PROVENTIL HFA;VENTOLIN HFA) 108 (90 BASE) MCG/ACT inhaler Inhale 2  puffs into the lungs every 6 (six) hours as needed for shortness of breath.    Marland Kitchen aspirin EC 81 MG tablet Take 81 mg by mouth at bedtime.    Marland Kitchen b complex vitamins tablet Take 1 tablet by mouth daily.    Marland Kitchen bismuth subsalicylate (PEPTO BISMOL) 262 MG/15ML suspension Take 30 mLs by mouth every 6 (six) hours as needed.    . busPIRone (BUSPAR) 5 MG tablet Take 5 mg by mouth 2 (two)  times daily.    . Calcium Carbonate-Vitamin D (CALCIUM 600+D PO) Take 1 tablet by mouth 2 (two) times daily.    . chlorthalidone (HYGROTON) 25 MG tablet Take 25 mg by mouth daily.     . Cyanocobalamin (B-12) 2500 MCG TABS Take 1 capsule by mouth daily.    . fluticasone (FLONASE) 50 MCG/ACT nasal spray SPRAY 2 SPRAYS INTO EACH NOSTRIL ONCE DAILY    . ipratropium-albuterol (DUONEB) 0.5-2.5 (3) MG/3ML SOLN ipratropium 0.5 mg-albuterol 3 mg (2.5 mg base)/3 mL nebulization soln    . lidocaine (XYLOCAINE) 2 % solution TAKE 2 TEASPOONFULS BEFORE MEALS AND AT BEDTIME AS NEEDED MAY REPEAT EVERY 4 HOURS. (MAX OF 8 DOSES PER DAY)    . losartan (COZAAR) 100 MG tablet Take 100 mg by mouth daily.    . metoprolol succinate (TOPROL-XL) 50 MG 24 hr tablet Take 75 mg by mouth daily.     . montelukast (SINGULAIR) 10 MG tablet Take 10 mg by mouth daily.     . nitroGLYCERIN (NITROSTAT) 0.4 MG SL tablet Place 0.4 mg under the tongue every 5 (five) minutes as needed for chest pain.     . pantoprazole (PROTONIX) 40 MG tablet Take 1 tablet (40 mg total) by mouth 2 (two) times daily before a meal.    . polyethylene glycol (MIRALAX / GLYCOLAX) packet Take 17 g by mouth daily as needed for mild constipation or moderate constipation.     . potassium chloride (K-DUR,KLOR-CON) 10 MEQ tablet Take 40 mEq by mouth 2 (two) times daily.     . Probiotic Product (PROBIOTIC PO) Take 1 capsule by mouth daily. [employer].    . sertraline (ZOLOFT) 100 MG tablet Take 200 mg by mouth daily.     . Simethicone (GAS-X PO) Take 2 tablets by mouth daily as needed (gas).    . simvastatin (ZOCOR) 40 MG tablet Take 40 mg by mouth at bedtime.      . vitamin C (ASCORBIC ACID) 500 MG tablet Take 500 mg by mouth daily.    Marland Kitchen warfarin (COUMADIN) 1 MG tablet Take 4 mg by mouth daily. 3 days a week    . warfarin (COUMADIN) 3 MG tablet Take 3 mg by mouth daily. 4 days a week     Review of Systems PER HPI OTHERWISE ALL SYSTEMS ARE NEGATIVE.      Objective:   Physical Exam  TELEPHONE VISIT DUE TO COVID 19, VISIT IS CONDUCTED VIRTUALLY AND WAS REQUESTED BY PATIENT.     Assessment & Plan:

## 2018-09-19 NOTE — Assessment & Plan Note (Signed)
SYMPTOMS FAIRLY WELL CONTROLLED.  DRINK WATER TO KEEP YOUR URINE LIGHT YELLOW. FOLLOW A HIGH FIBER DIET. AVOID ITEMS THAT CAUSE BLOATING & GAS.  HANDOUT GIVEN.USE MIRALAX 1 SCOOP DAILY IN YOUR COFFEE. INCREASE TO TWICE DAILY IF YOU ARE NOT HAVING A BM 3-4 TIMES A WEEK. USE MILK OF MAGNESIA PILLS OR LIQUID AT LUNCH IF YOU ARE NOT HAVING A SATISFACTORY BOWEL MOVEMENT. YOU CAN USE IT UP TO THREE TIMES A DAY TO REDUCE CONSTIPATION. PLEASE CALL IN ONE MONTH IF SYMPTOMS ARE NOT IMPROVED.  FOLLOW UP IN 6 MOS.

## 2018-09-19 NOTE — Patient Instructions (Addendum)
DRINK WATER TO KEEP YOUR URINE LIGHT YELLOW.  FOLLOW A HIGH FIBER DIET. AVOID ITEMS THAT CAUSE BLOATING & GAS. SEE INFO BELOW.  USE MIRALAX 1 SCOOP DAILY IN YOUR COFFEE. INCREASE TO TWICE DAILY IF YOU ARE NOT HAVING A BM 3-4 TIMES A WEEK.  USE MILK OF MAGNESIA PILLS OR LIQUID AT LUNCH IF YOU ARE NOT HAVING A SATISFACTORY BOWEL MOVEMENT. YOU CAN USE IT UP TO THREE TIMES A DAY TO REDUCE CONSTIPATION.   CONTINUE PROTONIX. TAKE 30 MINUTES PRIOR TO MEALS TWICE DAILY. USE VISCOUS LIDOCAINE AND PEPCID FOR HEARTBURN FLARES.   PLEASE CALL IN ONE MONTH IF SYMPTOMS ARE NOT IMPROVED.   FOLLOW UP IN 6 MOS.    High-Fiber Diet A high-fiber diet changes your normal diet to include more whole grains, legumes, fruits, and vegetables. Changes in the diet involve replacing refined carbohydrates with unrefined foods. The calorie level of the diet is essentially unchanged. The Dietary Reference Intake (recommended amount) for adult males is 38 grams per day. For adult females, it is 25 grams per day. Pregnant and lactating women should consume 28 grams of fiber per day. Fiber is the intact part of a plant that is not broken down during digestion. Functional fiber is fiber that has been isolated from the plant to provide a beneficial effect in the body.  PURPOSE  Increase stool bulk.   Ease and regulate bowel movements.   Lower cholesterol.   REDUCE RISK OF COLON CANCER  INDICATIONS THAT YOU NEED MORE FIBER  Constipation and hemorrhoids.   Uncomplicated diverticulosis (intestine condition) and irritable bowel syndrome.   Weight management.   As a protective measure against hardening of the arteries (atherosclerosis), diabetes, and cancer.   GUIDELINES FOR INCREASING FIBER IN THE DIET  Start adding fiber to the diet slowly. A gradual increase of about 5 more grams (2 slices of whole-wheat bread, 2 servings of most fruits or vegetables, or 1 bowl of high-fiber cereal) per day is best. Too rapid an  increase in fiber may result in constipation, flatulence, and bloating.   Drink enough water and fluids to keep your urine clear or pale yellow. Water, juice, or caffeine-free drinks are recommended. Not drinking enough fluid may cause constipation.   Eat a variety of high-fiber foods rather than one type of fiber.   Try to increase your intake of fiber through using high-fiber foods rather than fiber pills or supplements that contain small amounts of fiber.   The goal is to change the types of food eaten. Do not supplement your present diet with high-fiber foods, but replace foods in your present diet.    INCLUDE A VARIETY OF FIBER SOURCES  Replace refined and processed grains with whole grains, canned fruits with fresh fruits, and incorporate other fiber sources. White rice, white breads, and most bakery goods contain little or no fiber.   Brown whole-grain rice, buckwheat oats, and many fruits and vegetables are all good sources of fiber. These include: broccoli, Brussels sprouts, cabbage, cauliflower, beets, sweet potatoes, white potatoes (skin on), carrots, tomatoes, eggplant, squash, berries, fresh fruits, and dried fruits.   Cereals appear to be the richest source of fiber. Cereal fiber is found in whole grains and bran. Bran is the fiber-rich outer coat of cereal grain, which is largely removed in refining. In whole-grain cereals, the bran remains. In breakfast cereals, the largest amount of fiber is found in those with "bran" in their names. The fiber content is sometimes indicated on the label.  You may need to include additional fruits and vegetables each day.   In baking, for 1 cup white flour, you may use the following substitutions:   1 cup whole-wheat flour minus 2 tablespoons.   1/2 cup white flour plus 1/2 cup whole-wheat flour.

## 2018-09-19 NOTE — Assessment & Plan Note (Signed)
SYMPTOMS FAIRLY WELL CONTROLLED.  CONTINUE PROTONIX. TAKE 30 MINUTES PRIOR TO MEALS TWICE DAILY. USE VISCOUS LIDOCAINE AND PEPCID FOR HEARTBURN FLARES. PLEASE CALL IN ONE MONTH IF SYMPTOMS ARE NOT IMPROVED.  FOLLOW UP IN 6 MOS.

## 2018-09-20 NOTE — Progress Notes (Signed)
CC'D TO PCP °

## 2018-09-20 NOTE — Progress Notes (Signed)
ON RECALL  °

## 2018-10-26 ENCOUNTER — Other Ambulatory Visit: Payer: Self-pay | Admitting: Nurse Practitioner

## 2018-10-29 ENCOUNTER — Encounter: Payer: Self-pay | Admitting: Gastroenterology

## 2018-11-01 ENCOUNTER — Other Ambulatory Visit: Payer: Self-pay

## 2018-11-01 ENCOUNTER — Inpatient Hospital Stay (HOSPITAL_COMMUNITY): Payer: Medicare Other | Attending: Hematology

## 2018-11-01 DIAGNOSIS — Z86711 Personal history of pulmonary embolism: Secondary | ICD-10-CM | POA: Diagnosis not present

## 2018-11-01 DIAGNOSIS — I2510 Atherosclerotic heart disease of native coronary artery without angina pectoris: Secondary | ICD-10-CM | POA: Insufficient documentation

## 2018-11-01 DIAGNOSIS — E785 Hyperlipidemia, unspecified: Secondary | ICD-10-CM | POA: Diagnosis not present

## 2018-11-01 DIAGNOSIS — R296 Repeated falls: Secondary | ICD-10-CM | POA: Insufficient documentation

## 2018-11-01 DIAGNOSIS — Z950 Presence of cardiac pacemaker: Secondary | ICD-10-CM | POA: Diagnosis not present

## 2018-11-01 DIAGNOSIS — I252 Old myocardial infarction: Secondary | ICD-10-CM | POA: Insufficient documentation

## 2018-11-01 DIAGNOSIS — Z7901 Long term (current) use of anticoagulants: Secondary | ICD-10-CM | POA: Diagnosis not present

## 2018-11-01 DIAGNOSIS — Z79899 Other long term (current) drug therapy: Secondary | ICD-10-CM | POA: Diagnosis not present

## 2018-11-01 DIAGNOSIS — I10 Essential (primary) hypertension: Secondary | ICD-10-CM | POA: Insufficient documentation

## 2018-11-01 DIAGNOSIS — G4730 Sleep apnea, unspecified: Secondary | ICD-10-CM | POA: Diagnosis not present

## 2018-11-01 DIAGNOSIS — F419 Anxiety disorder, unspecified: Secondary | ICD-10-CM | POA: Diagnosis not present

## 2018-11-01 DIAGNOSIS — D509 Iron deficiency anemia, unspecified: Secondary | ICD-10-CM | POA: Insufficient documentation

## 2018-11-01 DIAGNOSIS — Z7982 Long term (current) use of aspirin: Secondary | ICD-10-CM | POA: Diagnosis not present

## 2018-11-01 DIAGNOSIS — D500 Iron deficiency anemia secondary to blood loss (chronic): Secondary | ICD-10-CM

## 2018-11-01 DIAGNOSIS — K589 Irritable bowel syndrome without diarrhea: Secondary | ICD-10-CM | POA: Diagnosis not present

## 2018-11-01 LAB — CBC WITH DIFFERENTIAL/PLATELET
Abs Immature Granulocytes: 0.04 K/uL (ref 0.00–0.07)
Basophils Absolute: 0 K/uL (ref 0.0–0.1)
Basophils Relative: 1 %
Eosinophils Absolute: 0.2 K/uL (ref 0.0–0.5)
Eosinophils Relative: 3 %
HCT: 33.8 % — ABNORMAL LOW (ref 36.0–46.0)
Hemoglobin: 10.6 g/dL — ABNORMAL LOW (ref 12.0–15.0)
Immature Granulocytes: 1 %
Lymphocytes Relative: 25 %
Lymphs Abs: 1.5 K/uL (ref 0.7–4.0)
MCH: 31 pg (ref 26.0–34.0)
MCHC: 31.4 g/dL (ref 30.0–36.0)
MCV: 98.8 fL (ref 80.0–100.0)
Monocytes Absolute: 0.5 K/uL (ref 0.1–1.0)
Monocytes Relative: 8 %
Neutro Abs: 3.7 K/uL (ref 1.7–7.7)
Neutrophils Relative %: 62 %
Platelets: 138 K/uL — ABNORMAL LOW (ref 150–400)
RBC: 3.42 MIL/uL — ABNORMAL LOW (ref 3.87–5.11)
RDW: 14.4 % (ref 11.5–15.5)
WBC: 6 K/uL (ref 4.0–10.5)
nRBC: 0 % (ref 0.0–0.2)

## 2018-11-01 LAB — COMPREHENSIVE METABOLIC PANEL WITH GFR
ALT: 20 U/L (ref 0–44)
AST: 29 U/L (ref 15–41)
Albumin: 3.8 g/dL (ref 3.5–5.0)
Alkaline Phosphatase: 67 U/L (ref 38–126)
Anion gap: 9 (ref 5–15)
BUN: 15 mg/dL (ref 8–23)
CO2: 26 mmol/L (ref 22–32)
Calcium: 9.2 mg/dL (ref 8.9–10.3)
Chloride: 106 mmol/L (ref 98–111)
Creatinine, Ser: 0.8 mg/dL (ref 0.44–1.00)
GFR calc Af Amer: >60 mL/min
GFR calc non Af Amer: >60 mL/min
Glucose, Bld: 93 mg/dL (ref 70–99)
Potassium: 3.7 mmol/L (ref 3.5–5.1)
Sodium: 141 mmol/L (ref 135–145)
Total Bilirubin: 0.6 mg/dL (ref 0.3–1.2)
Total Protein: 8.3 g/dL — ABNORMAL HIGH (ref 6.5–8.1)

## 2018-11-01 LAB — LACTATE DEHYDROGENASE: LDH: 170 U/L (ref 98–192)

## 2018-11-01 LAB — FERRITIN: Ferritin: 151 ng/mL (ref 11–307)

## 2018-11-01 NOTE — Progress Notes (Signed)
For review.  Please update ordering provider

## 2018-11-02 ENCOUNTER — Other Ambulatory Visit (HOSPITAL_COMMUNITY): Payer: Medicare Other

## 2018-11-06 ENCOUNTER — Other Ambulatory Visit: Payer: Self-pay

## 2018-11-07 ENCOUNTER — Encounter (HOSPITAL_COMMUNITY): Payer: Self-pay | Admitting: Nurse Practitioner

## 2018-11-07 ENCOUNTER — Inpatient Hospital Stay (HOSPITAL_BASED_OUTPATIENT_CLINIC_OR_DEPARTMENT_OTHER): Payer: Medicare Other | Admitting: Nurse Practitioner

## 2018-11-07 DIAGNOSIS — D508 Other iron deficiency anemias: Secondary | ICD-10-CM

## 2018-11-07 DIAGNOSIS — Z79899 Other long term (current) drug therapy: Secondary | ICD-10-CM

## 2018-11-07 DIAGNOSIS — Z7901 Long term (current) use of anticoagulants: Secondary | ICD-10-CM

## 2018-11-07 DIAGNOSIS — Z86711 Personal history of pulmonary embolism: Secondary | ICD-10-CM | POA: Diagnosis not present

## 2018-11-07 DIAGNOSIS — R296 Repeated falls: Secondary | ICD-10-CM

## 2018-11-07 DIAGNOSIS — D509 Iron deficiency anemia, unspecified: Secondary | ICD-10-CM

## 2018-11-07 NOTE — Assessment & Plan Note (Addendum)
1.  Iron deficiency anemia: - Due to GI blood loss. - Patient is being seen by Dr. Barney Drain and underwent an extensive GI work-up for lower GI bleed in 2018, without any significant source found.  She also has a longstanding history of dyspepsia. -She last received IV iron on 08/07/2017. -She denies any bright red bleeding per rectum or melena -She reports she has been falling more.  She feels like it is from her knee giving out.  She has multiple bruises on her arms and legs.  She reports her PCP has checked her out and they are trying to get her Coumadin levels therapeutic. -Labs on 11/01/2018 showed her potassium 3.7, creatinine 0.80, LDH 170, ferritin 151, WBC 6.0, hemoglobin 10.6 which had decreased from 12, and platelets 138. -I have recommended 2 infusions of IV iron this time -She will follow-up in 4 months with repeat labs  2.  History of PE: -She remains on Coumadin. -INRs are monitored through her PCP. -She reports they are having problems getting her Coumadin levels therapeutic. -She also reports she is having frequent falls and she is bruised due to them.

## 2018-11-07 NOTE — Progress Notes (Signed)
Courtney Rogers, Courtney Rogers [id number]   CLINIC:  Medical Oncology/Hematology  PCP:  [facility] [place] [id number] [phone]   REASON FOR VISIT: Follow-up for iron deficiency anemia  CURRENT THERAPY: Intermittent iron infusions   INTERVAL HISTORY:  Courtney Rogers 68 y.o. female returns for routine follow-up for iron deficiency anemia.  She reports she is having problems falling lately due to her right knee giving out and she loses her balance.  She has multiple bruises on her knee and her arms due to her falls.  She also reports her Coumadin levels have not been therapeutic they are being monitored by the Coumadin clinic.  But lately they are having problems keeping her therapeutic.  She also reports she is more fatigued than normal.  However she denies any bright red bleeding per rectum or melena Denies any nausea, vomiting, or diarrhea. Denies any new pains. Had not noticed any recent bleeding such as epistaxis, hematuria or hematochezia. Denies recent chest pain on exertion, shortness of breath on minimal exertion, pre-syncopal episodes, or palpitations. Denies any numbness or tingling in hands or feet. Denies any recent fevers, infections, or recent hospitalizations. Patient reports appetite at 75 % and energy level at 50 %.  She is eating well and maintaining her weight at this time.   REVIEW OF SYSTEMS:  Review of Systems  Constitutional: Positive for fatigue.  Musculoskeletal: Positive for gait problem.  Neurological: Positive for gait problem.  Hematological: Bruises/bleeds easily.  All other systems reviewed and are negative.    PAST MEDICAL/SURGICAL HISTORY:  Past Medical History:  Diagnosis Date  . Acute myocardial infarction [facility]) 2009   CAD/no stent medically managed  . Anxiety disorder   . CAD (coronary artery disease)   . Hyperlipidemia   . Hypertension   . IBS (irritable bowel syndrome)   .  Overactive bladder   . PE (pulmonary embolism) JUN 2014  . Presence of permanent cardiac pacemaker   . Sleep apnea    Past Surgical History:  Procedure Laterality Date  . ABDOMINAL HYSTERECTOMY    . BSO secondary to cyst    . CARDIAC CATHETERIZATION    . COLONOSCOPY  12/2009   Dr. West Carbo, propofol, normal. Next TCS 12/2019  . COLONOSCOPY N/A 05/22/2017   Procedure: COLONOSCOPY;  Surgeon: Danie Binder, MD;  [facility];  Service: Endoscopy;  Laterality: N/A;  10:30am  . ESOPHAGOGASTRODUODENOSCOPY  05/11/09   schatzki ring/small hiatal hernia/path:gastritis  . ESOPHAGOGASTRODUODENOSCOPY N/A 05/22/2017   Procedure: ESOPHAGOGASTRODUODENOSCOPY (EGD);  Surgeon: Danie Binder, MD;  [facility];  Service: Endoscopy;  Laterality: N/A;  . ESOPHAGOGASTRODUODENOSCOPY (EGD) WITH ESOPHAGEAL DILATION N/A 04/01/2013   CHE:[id number] at the gastroesophageal juction/multiple small polyps/mild gastritis  . GIVENS CAPSULE STUDY N/A 06/07/2017   Procedure: GIVENS CAPSULE STUDY;  Surgeon: Danie Binder, MD;  [facility];  Service: Endoscopy;  Laterality: N/A;  7:30am  . INSERT / REPLACE / REMOVE PACEMAKER     Last year per pt.(can't remember date)     SOCIAL HISTORY:  Social History   Socioeconomic History  . Marital status: Married    Spouse name: Not on file  . Number of children: Not on file  . Years of education: Not on file  . Highest education level: Not on file  Occupational History  . Not on file  Social Needs  . Financial resource strain: Not hard at all  .  Food insecurity    Worry: Never true    Inability: Never true  . Transportation needs    Medical: No    Non-medical: No  Tobacco Use  . Smoking status: Never Smoker  . Smokeless tobacco: Never Used  . Tobacco comment: Never smoked   Substance and Sexual Activity  . Alcohol use: No    Alcohol/week: 0.0 standard drinks  . Drug use: No  . Sexual activity: Not Currently  Lifestyle  .  Physical activity    Days per week: 0 days    Minutes per session: 0 min  . Stress: Only a little  Relationships  . Social connections    Talks on phone: More than three times a week    Gets together: More than three times a week    Attends religious service: More than 4 times per year    Active member of club or organization: No    Attends meetings of clubs or organizations: Never    Relationship status: Not on file  . Intimate partner violence    Fear of current or ex partner: No    Emotionally abused: No    Physically abused: No    Forced sexual activity: No  Other Topics Concern  . Not on file  Social History Narrative   Married.  Children: ONE IN [place],ONE IN [place], ONE BESIDE HER.  Lives in [place].  Eats meats fruits and vegetables. 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    FAMILY HISTORY:  Family History  Problem Relation Age of Onset  . Colon polyps Neg Hx   . Colon cancer Neg Hx     CURRENT MEDICATIONS:  Outpatient Encounter Medications as of 11/07/2018  Medication Sig  . acetaminophen (TYLENOL) 500 MG tablet Take 1,000 mg by mouth 2 (two) times daily as needed for moderate pain or headache.  Marland Kitchen aspirin EC 81 MG tablet Take 81 mg by mouth at bedtime.  Marland Kitchen b complex vitamins tablet Take 1 tablet by mouth daily.  . busPIRone (BUSPAR) 5 MG tablet Take 5 mg by mouth 2 (two) times daily.  . Calcium Carbonate-Vitamin D (CALCIUM 600+D PO) Take 1 tablet by mouth 2 (two) times daily.  . chlorthalidone (HYGROTON) 25 MG tablet Take 25 mg by mouth daily.   . Cyanocobalamin (B-12) 2500 MCG TABS Take 1 capsule by mouth daily.  . fluticasone (FLONASE) 50 MCG/ACT nasal spray SPRAY 2 SPRAYS INTO EACH NOSTRIL ONCE DAILY  . lidocaine (XYLOCAINE) 2 % solution TAKE 2 TEASPOONFULS BEFORE MEALS AND AT BEDTIME AS NEEDED MAY REPEAT EVERY 4 HOURS. (MAX OF 8 DOSES PER DAY)  . losartan (COZAAR) 100 MG tablet Take 100 mg by mouth daily.  . metoprolol succinate  (TOPROL-XL) 50 MG 24 hr tablet Take 75 mg by mouth daily.   . montelukast (SINGULAIR) 10 MG tablet Take 10 mg by mouth daily.   . pantoprazole (PROTONIX) 40 MG tablet Take 1 tablet (40 mg total) by mouth 2 (two) times daily before a meal.  . potassium chloride (K-DUR,KLOR-CON) 10 MEQ tablet Take 40 mEq by mouth 2 (two) times daily.   . Probiotic Product (PROBIOTIC PO) Take 1 capsule by mouth daily. [employer].  . sertraline (ZOLOFT) 100 MG tablet Take 200 mg by mouth daily.   . simvastatin (ZOCOR) 40 MG tablet Take 40 mg by mouth at bedtime.    . vitamin C (ASCORBIC ACID) 500 MG tablet Take 500 mg by mouth daily.  Marland Kitchen warfarin (  COUMADIN) 1 MG tablet Take 4 mg by mouth daily. 3 days a week  . warfarin (COUMADIN) 3 MG tablet Take 3 mg by mouth daily. 4 days a week  . albuterol (PROVENTIL HFA;VENTOLIN HFA) 108 (90 BASE) MCG/ACT inhaler Inhale 2 puffs into the lungs every 6 (six) hours as needed for shortness of breath.  Marland Kitchen ipratropium-albuterol (DUONEB) 0.5-2.5 (3) MG/3ML SOLN ipratropium 0.5 mg-albuterol 3 mg (2.5 mg base)/3 mL nebulization soln  . nitroGLYCERIN (NITROSTAT) 0.4 MG SL tablet Place 0.4 mg under the tongue every 5 (five) minutes as needed for chest pain.   . polyethylene glycol (MIRALAX / GLYCOLAX) packet Take 17 g by mouth daily as needed for mild constipation or moderate constipation.   . Simethicone (GAS-X PO) Take 2 tablets by mouth daily as needed (gas).  . [DISCONTINUED] bismuth subsalicylate (PEPTO BISMOL) 262 MG/15ML suspension Take 30 mLs by mouth every 6 (six) hours as needed.   No facility-administered encounter medications on file as of 11/07/2018.     ALLERGIES:  Allergies  Allergen Reactions  . Biaxin [Clarithromycin] Other (See Comments)    Stomach problems   . Lisinopril Swelling     PHYSICAL EXAM:  ECOG Performance status: 1  Vitals:   11/07/18 1000  BP: (!) 145/80  Pulse: 83  Resp: 16  Temp: 98.4 F (36.9 C)  SpO2: 100%   Filed Weights    11/07/18 1000  Weight: 176 lb 1 oz (79.9 kg)    Physical Exam Constitutional:      Appearance: Normal appearance. She is normal weight.  Cardiovascular:     Rate and Rhythm: Normal rate and regular rhythm.     Heart sounds: Normal heart sounds.  Pulmonary:     Effort: Pulmonary effort is normal.     Breath sounds: Normal breath sounds.  Abdominal:     General: Bowel sounds are normal.     Palpations: Abdomen is soft.  Musculoskeletal: Normal range of motion.  Skin:    General: Skin is warm and dry.  Neurological:     Mental Status: She is alert and oriented to person, place, and time. Mental status is at baseline.  Psychiatric:        Mood and Affect: Mood normal.        Behavior: Behavior normal.        Thought Content: Thought content normal.        Judgment: Judgment normal.      LABORATORY DATA:  I have reviewed the labs as listed.  CBC    Component Value Date/Time   WBC 6.0 11/01/2018 1036   RBC 3.42 (L) 11/01/2018 1036   HGB 10.6 (L) 11/01/2018 1036   HCT 33.8 (L) 11/01/2018 1036   PLT 138 (L) 11/01/2018 1036   MCV 98.8 11/01/2018 1036   MCH 31.0 11/01/2018 1036   MCHC 31.4 11/01/2018 1036   RDW 14.4 11/01/2018 1036   LYMPHSABS 1.5 11/01/2018 1036   MONOABS 0.5 11/01/2018 1036   EOSABS 0.2 11/01/2018 1036   BASOSABS 0.0 11/01/2018 1036   CMP Latest Ref Rng & Units 11/01/2018 05/02/2018 04/10/2018  Glucose 70 - 99 mg/dL 93 110(H) -  BUN 8 - 23 mg/dL 15 16 -  Creatinine 0.44 - 1.00 mg/dL 0.80 0.83 0.70  Sodium 135 - 145 mmol/L 141 137 -  Potassium 3.5 - 5.1 mmol/L 3.7 3.2(L) -  Chloride 98 - 111 mmol/L 106 102 -  CO2 22 - 32 mmol/L 26 26 -  Calcium 8.9 - 10.3  mg/dL 9.2 9.0 -  Total Protein 6.5 - 8.1 g/dL 8.3(H) 8.7(H) -  Total Bilirubin 0.3 - 1.2 mg/dL 0.6 0.8 -  Alkaline Phos 38 - 126 U/L 67 58 -  AST 15 - 41 U/L 29 37 -  ALT 0 - 44 U/L 20 25 -     I personally performed a face-to-face visit.  All questions were answered to patient's stated  satisfaction. Encouraged patient to call with any new concerns or questions before his next visit to the cancer center and we can certain see him sooner, if needed.     ASSESSMENT & PLAN:   Iron deficiency anemia 1.  Iron deficiency anemia: - Due to GI blood loss. - Patient is being seen by Dr. Barney Drain and underwent an extensive GI work-up for lower GI bleed in 2018, without any significant source found.  She also has a longstanding history of dyspepsia. -She last received IV iron on 08/07/2017. -She denies any bright red bleeding per rectum or melena -She reports she has been falling more.  She feels like it is from her knee giving out.  She has multiple bruises on her arms and legs.  She reports her PCP has checked her out and they are trying to get her Coumadin levels therapeutic. -Labs on 11/01/2018 showed her potassium 3.7, creatinine 0.80, LDH 170, ferritin 151, WBC 6.0, hemoglobin 10.6 which had decreased from 12, and platelets 138. -I have recommended 2 infusions of IV iron this time -She will follow-up in 4 months with repeat labs  2.  History of PE: -She remains on Coumadin. -INRs are monitored through her PCP. -She reports they are having problems getting her Coumadin levels therapeutic. -She also reports she is having frequent falls and she is bruised due to them.      Orders placed this encounter:  Orders Placed This Encounter  Procedures  . Lactate dehydrogenase  . CBC with Differential/Platelet  . Comprehensive metabolic panel  . Ferritin  . Iron and TIBC  . VITAMIN D 25 Hydroxy (Vit-D Deficiency, Fractures)  . Vitamin B12  . Folate     Francene Finders, FNP-C [place] [phone]

## 2018-11-07 NOTE — Patient Instructions (Signed)
[facility] at [facility] Discharge Instructions Follow up in 4 months with labs    Thank you for choosing [facility] at [facility] to provide your oncology and hematology care.  To afford each patient quality time with our provider, please arrive at least 15 minutes before your scheduled appointment time.   If you have a lab appointment with the Cancer Center please come in thru the  Main Entrance and check in at the main information desk  You need to re-schedule your appointment should you arrive 10 or more minutes late.  We strive to give you quality time with our providers, and arriving late affects you and other patients whose appointments are after yours.  Also, if you no show three or more times for appointments you may be dismissed from the clinic at the providers discretion.     Again, thank you for choosing [facility].  Our hope is that these requests will decrease the amount of time that you wait before being seen by our physicians.       _____________________________________________________________  Should you have questions after your visit to [facility], please contact our office at [phone] between the hours of 8:00 a.m. and 4:30 p.m.  Voicemails left after 4:00 p.m. will not be returned until the following business day.  For prescription refill requests, have your pharmacy contact our office and allow 72 hours.    Cancer Center Support Programs:   > Cancer Support Group  2nd Tuesday of the month 1pm-2pm, Journey Room    

## 2018-11-08 ENCOUNTER — Telehealth: Payer: Self-pay | Admitting: Gastroenterology

## 2018-11-08 NOTE — Telephone Encounter (Signed)
PT said her heart doctor thinks she has lost too much weight. She weighed around 220 lbs when she first saw him and now is around 170 lbs. She said she does not feel she has lost too much. She has been doing fairly well to have IBS and sometimes can't eat as well. She is aware that Dr. Oneida Alar does not have any appointments right away. She goes back to see heart doctor next Wed and he had told her to see GI before she comes back. She does not want to see anyone other than Dr. Oneida Alar. Dr. Oneida Alar, please advise!

## 2018-11-08 NOTE — Telephone Encounter (Signed)
Pt received a recall letter to follow up with SF. I offered her the next available with SF on 8/13 at 2pm. She said that her heart doctor told her that she was losing too much weight and needed to see SF before next Wednesday. I offered for her to see extender, but she wants SF ONLY. Please advise when we can see her. [phone]

## 2018-11-09 NOTE — Telephone Encounter (Addendum)
PLEASE CALL PT. SHE WAS JUST SEEN IN APR 2020 AND HAD GI SYMPTOMS OTHER THAN CONSTIPATION. SHE HAD A CT CHEST/ABD JUN 2019 AND CT ABD/PELVIS NOV 2019 THAT SHOWED NO ACUTE OR SERIOUS DISEASE. HER THYROID TEST WAS NORMAL IN DEC 2019. ON DEC 2019 SHE WEIGHED 189 LBS. IN Nov 07, 2018  AT THE ONCOLOGY SHE WAS 176 LBS.  SHE HAS MANY PSYCHOSOCIAL STRESSORS TO ACCOUNT FOR HER WEIGHT LOSS BUT IF SHE IS STILL CONCERNED SHE SHOULD SEE HER PCP.

## 2018-11-13 NOTE — Telephone Encounter (Signed)
Pt is aware.  

## 2018-11-14 ENCOUNTER — Other Ambulatory Visit: Payer: Self-pay

## 2018-11-14 ENCOUNTER — Inpatient Hospital Stay (HOSPITAL_COMMUNITY): Payer: Medicare Other

## 2018-11-14 VITALS — BP 145/62 | HR 80 | Temp 98.6°F | Resp 16

## 2018-11-14 DIAGNOSIS — D509 Iron deficiency anemia, unspecified: Secondary | ICD-10-CM

## 2018-11-14 MED ORDER — SODIUM CHLORIDE 0.9 % IV SOLN
510.0000 mg | Freq: Once | INTRAVENOUS | Status: AC
  Administered 2018-11-14: 15:00:00 510 mg via INTRAVENOUS
  Filled 2018-11-14: qty 510

## 2018-11-14 MED ORDER — SODIUM CHLORIDE 0.9 % IV SOLN
Freq: Once | INTRAVENOUS | Status: AC
  Administered 2018-11-14: 14:00:00 via INTRAVENOUS

## 2018-11-14 NOTE — Patient Instructions (Signed)
[facility] at [facility]  Discharge Instructions:   _______________________________________________________________  Thank you for choosing [facility] at [facility] to provide your oncology and hematology care.  To afford each patient quality time with our providers, please arrive at least 15 minutes before your scheduled appointment.  You need to re-schedule your appointment if you arrive 10 or more minutes late.  We strive to give you quality time with our providers, and arriving late affects you and other patients whose appointments are after yours.  Also, if you no show three or more times for appointments you may be dismissed from the clinic.  Again, thank you for choosing  [facility] at [facility]. Our hope is that these requests will allow you access to exceptional care and in a timely manner. _______________________________________________________________  If you have questions after your visit, please contact our office at [phone] between the hours of 8:30 a.m. and 5:00 p.m. Voicemails left after 4:30 p.m. will not be returned until the following business day. _______________________________________________________________  For prescription refill requests, have your pharmacy contact our office. _______________________________________________________________  Recommendations made by the consultant and any test results will be sent to your referring physician. _______________________________________________________________ 

## 2018-11-14 NOTE — Progress Notes (Signed)
Feraheme given per orders. Patient tolerated it well without problems. Vitals stable and discharged home from clinic ambulatory. Follow up as scheduled.  

## 2018-11-21 ENCOUNTER — Inpatient Hospital Stay (HOSPITAL_COMMUNITY): Payer: Medicare Other | Attending: Hematology

## 2018-11-21 ENCOUNTER — Other Ambulatory Visit: Payer: Self-pay

## 2018-11-21 ENCOUNTER — Encounter (HOSPITAL_COMMUNITY): Payer: Self-pay | Admitting: Emergency Medicine

## 2018-11-21 ENCOUNTER — Encounter (HOSPITAL_COMMUNITY): Payer: Self-pay

## 2018-11-21 ENCOUNTER — Emergency Department (HOSPITAL_COMMUNITY)
Admission: EM | Admit: 2018-11-21 | Discharge: 2018-11-21 | Disposition: A | Discharge status: Home / Self Care | Payer: Medicare Other | Attending: Emergency Medicine | Admitting: Emergency Medicine

## 2018-11-21 VITALS — BP 160/75 | HR 65 | Temp 97.6°F | Resp 18

## 2018-11-21 DIAGNOSIS — M549 Dorsalgia, unspecified: Secondary | ICD-10-CM | POA: Insufficient documentation

## 2018-11-21 DIAGNOSIS — D509 Iron deficiency anemia, unspecified: Secondary | ICD-10-CM

## 2018-11-21 DIAGNOSIS — Z5321 Procedure and treatment not carried out due to patient leaving prior to being seen by health care provider: Secondary | ICD-10-CM | POA: Diagnosis not present

## 2018-11-21 MED ORDER — SODIUM CHLORIDE 0.9 % IV SOLN
510.0000 mg | Freq: Once | INTRAVENOUS | Status: AC
  Administered 2018-11-21: 510 mg via INTRAVENOUS
  Filled 2018-11-21: qty 510

## 2018-11-21 MED ORDER — SODIUM CHLORIDE 0.9 % IV SOLN
Freq: Once | INTRAVENOUS | Status: AC
  Administered 2018-11-21: 15:00:00 via INTRAVENOUS

## 2018-11-21 NOTE — ED Notes (Signed)
Notified by Registration that patient LWBS after Triage at 1755.

## 2018-11-21 NOTE — Patient Instructions (Signed)
[facility] at [facility]  Discharge Instructions:   _______________________________________________________________  Thank you for choosing [facility] at [facility] to provide your oncology and hematology care.  To afford each patient quality time with our providers, please arrive at least 15 minutes before your scheduled appointment.  You need to re-schedule your appointment if you arrive 10 or more minutes late.  We strive to give you quality time with our providers, and arriving late affects you and other patients whose appointments are after yours.  Also, if you no show three or more times for appointments you may be dismissed from the clinic.  Again, thank you for choosing [facility] at [facility]. Our hope is that these requests will allow you access to exceptional care and in a timely manner. _______________________________________________________________  If you have questions after your visit, please contact our office at [phone] between the hours of 8:30 a.m. and 5:00 p.m. Voicemails left after 4:30 p.m. will not be returned until the following business day. _______________________________________________________________  For prescription refill requests, have your pharmacy contact our office. _______________________________________________________________  Recommendations made by the consultant and any test results will be sent to your referring physician. _______________________________________________________________ 

## 2018-11-21 NOTE — Progress Notes (Signed)
Pt presents today for Feraheme infusion. VSS. BP slightly elevated. Pt was late getting to appointment so she was flustered upon arrival per pt's words. MAR reviewed. Pt has no complaints of any changes or pain today.  Feraheme  given today per MD orders. Tolerated infusion without adverse affects. Vital signs stable. No complaints at this time. Discharged from clinic ambulatory. F/U with [facility] as scheduled.

## 2018-11-21 NOTE — ED Triage Notes (Signed)
Patient reports pain around her L shoulder blade x 2 weeks.Denies CP or SOB, denies diaphoresis or nausea.

## 2019-01-03 ENCOUNTER — Ambulatory Visit: Payer: Medicare Other | Admitting: Gastroenterology

## 2019-02-19 ENCOUNTER — Encounter: Payer: Self-pay | Admitting: Gastroenterology

## 2019-03-07 ENCOUNTER — Other Ambulatory Visit: Payer: Self-pay | Admitting: Nurse Practitioner

## 2019-03-11 ENCOUNTER — Inpatient Hospital Stay (HOSPITAL_COMMUNITY): Payer: Medicare Other

## 2019-03-11 ENCOUNTER — Other Ambulatory Visit: Payer: Self-pay

## 2019-03-11 DIAGNOSIS — K922 Gastrointestinal hemorrhage, unspecified: Secondary | ICD-10-CM | POA: Diagnosis not present

## 2019-03-11 DIAGNOSIS — Z950 Presence of cardiac pacemaker: Secondary | ICD-10-CM | POA: Diagnosis not present

## 2019-03-11 DIAGNOSIS — E785 Hyperlipidemia, unspecified: Secondary | ICD-10-CM | POA: Diagnosis not present

## 2019-03-11 DIAGNOSIS — Z86711 Personal history of pulmonary embolism: Secondary | ICD-10-CM | POA: Insufficient documentation

## 2019-03-11 DIAGNOSIS — Z7901 Long term (current) use of anticoagulants: Secondary | ICD-10-CM | POA: Insufficient documentation

## 2019-03-11 DIAGNOSIS — I2510 Atherosclerotic heart disease of native coronary artery without angina pectoris: Secondary | ICD-10-CM | POA: Diagnosis not present

## 2019-03-11 DIAGNOSIS — G4730 Sleep apnea, unspecified: Secondary | ICD-10-CM | POA: Insufficient documentation

## 2019-03-11 DIAGNOSIS — Z90722 Acquired absence of ovaries, bilateral: Secondary | ICD-10-CM | POA: Insufficient documentation

## 2019-03-11 DIAGNOSIS — K589 Irritable bowel syndrome without diarrhea: Secondary | ICD-10-CM | POA: Diagnosis not present

## 2019-03-11 DIAGNOSIS — Z7982 Long term (current) use of aspirin: Secondary | ICD-10-CM | POA: Diagnosis not present

## 2019-03-11 DIAGNOSIS — D500 Iron deficiency anemia secondary to blood loss (chronic): Secondary | ICD-10-CM | POA: Diagnosis present

## 2019-03-11 DIAGNOSIS — D508 Other iron deficiency anemias: Secondary | ICD-10-CM

## 2019-03-11 DIAGNOSIS — F419 Anxiety disorder, unspecified: Secondary | ICD-10-CM | POA: Diagnosis not present

## 2019-03-11 DIAGNOSIS — Z79899 Other long term (current) drug therapy: Secondary | ICD-10-CM | POA: Insufficient documentation

## 2019-03-11 DIAGNOSIS — I10 Essential (primary) hypertension: Secondary | ICD-10-CM | POA: Diagnosis not present

## 2019-03-11 DIAGNOSIS — I252 Old myocardial infarction: Secondary | ICD-10-CM | POA: Insufficient documentation

## 2019-03-11 LAB — CBC WITH DIFFERENTIAL/PLATELET
Abs Immature Granulocytes: 0.02 10*3/uL (ref 0.00–0.07)
Basophils Absolute: 0 10*3/uL (ref 0.0–0.1)
Basophils Relative: 1 %
Eosinophils Absolute: 0.3 10*3/uL (ref 0.0–0.5)
Eosinophils Relative: 4 %
HCT: 35.7 % — ABNORMAL LOW (ref 36.0–46.0)
Hemoglobin: 11.3 g/dL — ABNORMAL LOW (ref 12.0–15.0)
Immature Granulocytes: 0 %
Lymphocytes Relative: 28 %
Lymphs Abs: 1.7 10*3/uL (ref 0.7–4.0)
MCH: 32.4 pg (ref 26.0–34.0)
MCHC: 31.7 g/dL (ref 30.0–36.0)
MCV: 102.3 fL — ABNORMAL HIGH (ref 80.0–100.0)
Monocytes Absolute: 0.4 10*3/uL (ref 0.1–1.0)
Monocytes Relative: 7 %
Neutro Abs: 3.7 10*3/uL (ref 1.7–7.7)
Neutrophils Relative %: 60 %
Platelets: 117 10*3/uL — ABNORMAL LOW (ref 150–400)
RBC: 3.49 MIL/uL — ABNORMAL LOW (ref 3.87–5.11)
RDW: 13.9 % (ref 11.5–15.5)
WBC: 6.1 10*3/uL (ref 4.0–10.5)
nRBC: 0 % (ref 0.0–0.2)

## 2019-03-11 LAB — COMPREHENSIVE METABOLIC PANEL
ALT: 23 U/L (ref 0–44)
AST: 26 U/L (ref 15–41)
Albumin: 3.7 g/dL (ref 3.5–5.0)
Alkaline Phosphatase: 66 U/L (ref 38–126)
Anion gap: 11 (ref 5–15)
BUN: 13 mg/dL (ref 8–23)
CO2: 26 mmol/L (ref 22–32)
Calcium: 9 mg/dL (ref 8.9–10.3)
Chloride: 103 mmol/L (ref 98–111)
Creatinine, Ser: 0.76 mg/dL (ref 0.44–1.00)
GFR calc Af Amer: >60 mL/min (ref >60)
GFR calc non Af Amer: >60 mL/min (ref >60)
Glucose, Bld: 85 mg/dL (ref 70–99)
Potassium: 3.5 mmol/L (ref 3.5–5.1)
Sodium: 140 mmol/L (ref 135–145)
Total Bilirubin: 0.5 mg/dL (ref 0.3–1.2)
Total Protein: 8.5 g/dL — ABNORMAL HIGH (ref 6.5–8.1)

## 2019-03-11 LAB — FERRITIN: Ferritin: 482 ng/mL — ABNORMAL HIGH (ref 11–307)

## 2019-03-11 LAB — IRON AND TIBC
Iron: 78 ug/dL (ref 28–170)
Saturation Ratios: 27 % (ref 10.4–31.8)
TIBC: 293 ug/dL (ref 250–450)
UIBC: 215 ug/dL

## 2019-03-11 LAB — LACTATE DEHYDROGENASE: LDH: 170 U/L (ref 98–192)

## 2019-03-11 LAB — VITAMIN D 25 HYDROXY (VIT D DEFICIENCY, FRACTURES): Vit D, 25-Hydroxy: 39.23 ng/mL (ref 30–100)

## 2019-03-11 LAB — VITAMIN B12: Vitamin B-12: 1269 pg/mL — ABNORMAL HIGH (ref 180–914)

## 2019-03-11 LAB — FOLATE: Folate: 6.3 ng/mL (ref >5.9)

## 2019-03-12 ENCOUNTER — Inpatient Hospital Stay (HOSPITAL_COMMUNITY): Payer: Medicare Other | Attending: Nurse Practitioner | Admitting: Hematology

## 2019-03-12 DIAGNOSIS — D508 Other iron deficiency anemias: Secondary | ICD-10-CM | POA: Diagnosis not present

## 2019-03-12 DIAGNOSIS — D500 Iron deficiency anemia secondary to blood loss (chronic): Secondary | ICD-10-CM | POA: Diagnosis not present

## 2019-03-12 NOTE — Progress Notes (Signed)
[address] [place], [place] [address]   CLINIC:  Medical Oncology/Hematology  PCP:  [facility] [place] [address] [phone]   REASON FOR VISIT:  Follow-up for Anemia   CURRENT THERAPY: Clinical surveillance  BRIEF ONCOLOGIC HISTORY:  Oncology History   No history exists.       INTERVAL HISTORY:  Courtney Rogers 68 y.o. female presents today for follow-up.  Reports overall doing well.  Continues with reports of moderate fatigue.  Denies any obvious signs of bleeding.  Denies any chest pain, shortness of breath, lightheadedness or dizziness.  No change in bowel habits.  Appetite is stable.  No weight loss.  No fevers, chills, night sweats.  She is here for repeat labs and office visit.  REVIEW OF SYSTEMS:  Review of Systems  Constitutional: Positive for fatigue.  HENT:  Negative.   Eyes: Negative.   Respiratory: Negative.   Cardiovascular: Negative.   Gastrointestinal: Negative.   Endocrine: Negative.   Genitourinary: Negative.    Musculoskeletal: Positive for arthralgias, gait problem and myalgias.  Skin: Negative.   Neurological: Positive for extremity weakness and gait problem.  Hematological: Negative.   Psychiatric/Behavioral: Negative.      PAST MEDICAL/SURGICAL HISTORY:  Past Medical History:  Diagnosis Date  . Acute myocardial infarction [facility]) 2009   CAD/no stent medically managed  . Anxiety disorder   . CAD (coronary artery disease)   . Hyperlipidemia   . Hypertension   . IBS (irritable bowel syndrome)   . Overactive bladder   . PE (pulmonary embolism) JUN 2014  . Presence of permanent cardiac pacemaker   . Sleep apnea    Past Surgical History:  Procedure Laterality Date  . ABDOMINAL HYSTERECTOMY    . BSO secondary to cyst    . CARDIAC CATHETERIZATION    . COLONOSCOPY  12/2009   Dr. West Carbo, propofol, normal. Next TCS 12/2019  . COLONOSCOPY N/A 05/22/2017   Procedure:  COLONOSCOPY;  Surgeon: Danie Binder, MD;  Location: [facility];  Service: Endoscopy;  Laterality: N/A;  10:30am  . ESOPHAGOGASTRODUODENOSCOPY  05/11/09   schatzki ring/small hiatal hernia/path:gastritis  . ESOPHAGOGASTRODUODENOSCOPY N/A 05/22/2017   Procedure: ESOPHAGOGASTRODUODENOSCOPY (EGD);  Surgeon: Danie Binder, MD;  Location: [facility];  Service: Endoscopy;  Laterality: N/A;  . ESOPHAGOGASTRODUODENOSCOPY (EGD) WITH ESOPHAGEAL DILATION N/A 04/01/2013   CLE:[id number] at the gastroesophageal juction/multiple small polyps/mild gastritis  . GIVENS CAPSULE STUDY N/A 06/07/2017   Procedure: GIVENS CAPSULE STUDY;  Surgeon: Danie Binder, MD;  Location: [facility];  Service: Endoscopy;  Laterality: N/A;  7:30am  . INSERT / REPLACE / REMOVE PACEMAKER     Last year per pt.(can't remember date)     SOCIAL HISTORY:  Social History   Socioeconomic History  . Marital status: Married    Spouse name: Not on file  . Number of children: Not on file  . Years of education: Not on file  . Highest education level: Not on file  Occupational History  . Not on file  Social Needs  . Financial resource strain: Not hard at all  . Food insecurity    Worry: Never true    Inability: Never true  . Transportation needs    Medical: No    Non-medical: No  Tobacco Use  . Smoking status: Never Smoker  . Smokeless tobacco: Never Used  . Tobacco comment: Never smoked   Substance and Sexual Activity  . Alcohol  use: No    Alcohol/week: 0.0 standard drinks  . Drug use: No  . Sexual activity: Not Currently  Lifestyle  . Physical activity    Days per week: 0 days    Minutes per session: 0 min  . Stress: Only a little  Relationships  . Social connections    Talks on phone: More than three times a week    Gets together: More than three times a week    Attends religious service: More than 4 times per year    Active member of club or organization: No    Attends meetings of clubs or  organizations: Never    Relationship status: Not on file  . Intimate partner violence    Fear of current or ex partner: No    Emotionally abused: No    Physically abused: No    Forced sexual activity: No  Other Topics Concern  . Not on file  Social History Narrative   Married.  Children: ONE IN [place],ONE IN [place], ONE BESIDE HER.  Lives in [place].  Eats meats fruits and vegetables. 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    FAMILY HISTORY:  Family History  Problem Relation Age of Onset  . Colon polyps Neg Hx   . Colon cancer Neg Hx     CURRENT MEDICATIONS:  Outpatient Encounter Medications as of 03/12/2019  Medication Sig  . aspirin EC 81 MG tablet Take 81 mg by mouth at bedtime.  Marland Kitchen b complex vitamins tablet Take 1 tablet by mouth daily.  . busPIRone (BUSPAR) 5 MG tablet Take 5 mg by mouth 2 (two) times daily.  . Calcium Carbonate-Vitamin D (CALCIUM 600+D PO) Take 1 tablet by mouth 2 (two) times daily.  . chlorthalidone (HYGROTON) 25 MG tablet Take 25 mg by mouth daily.   . Cyanocobalamin (B-12) 2500 MCG TABS Take 1 capsule by mouth daily.  . fluticasone (FLONASE) 50 MCG/ACT nasal spray SPRAY 2 SPRAYS INTO EACH NOSTRIL ONCE DAILY  . gabapentin (NEURONTIN) 600 MG tablet Take 600 mg by mouth 2 (two) times daily.   Marland Kitchen ipratropium-albuterol (DUONEB) 0.5-2.5 (3) MG/3ML SOLN ipratropium 0.5 mg-albuterol 3 mg (2.5 mg base)/3 mL nebulization soln  . losartan (COZAAR) 100 MG tablet Take 100 mg by mouth daily.  . metoprolol succinate (TOPROL-XL) 50 MG 24 hr tablet Take 75 mg by mouth daily.   . montelukast (SINGULAIR) 10 MG tablet Take 10 mg by mouth daily.   . pantoprazole (PROTONIX) 40 MG tablet Take 1 tablet (40 mg total) by mouth 2 (two) times daily before a meal.  . potassium chloride (K-DUR,KLOR-CON) 10 MEQ tablet Take 40 mEq by mouth 2 (two) times daily.   . Probiotic Product (PROBIOTIC PO) Take 1 capsule by mouth daily. [facility].  .  sertraline (ZOLOFT) 100 MG tablet Take 200 mg by mouth daily.   . simvastatin (ZOCOR) 40 MG tablet Take 40 mg by mouth at bedtime.    . vitamin C (ASCORBIC ACID) 500 MG tablet Take 500 mg by mouth daily.  Marland Kitchen warfarin (COUMADIN) 5 MG tablet Take 5 mg by mouth at bedtime.  Marland Kitchen acetaminophen (TYLENOL) 500 MG tablet Take 1,000 mg by mouth 2 (two) times daily as needed for moderate pain or headache.  . albuterol (PROVENTIL HFA;VENTOLIN HFA) 108 (90 BASE) MCG/ACT inhaler Inhale 2 puffs into the lungs every 6 (six) hours as needed for shortness of breath.  . clobetasol ointment (TEMOVATE) 3.23 % Apply 1 application topically as  needed.   . lidocaine (XYLOCAINE) 2 % solution TAKE 2 TEASPOONFULS BEFORE MEALS AND AT BEDTIME AS NEEDED MAY REPEAT EVERY 4 HOURS. (MAX OF 8 DOSES PER DAY) (Patient not taking: Reported on 03/12/2019)  . nitroGLYCERIN (NITROSTAT) 0.4 MG SL tablet Place 0.4 mg under the tongue every 5 (five) minutes as needed for chest pain.   . Simethicone (GAS-X PO) Take 2 tablets by mouth daily as needed (gas).  . triamcinolone cream (KENALOG) 0.1 % Apply 1 application topically as needed.   . [DISCONTINUED] amLODipine (NORVASC) 5 MG tablet   . [DISCONTINUED] losartan (COZAAR) 50 MG tablet   . [DISCONTINUED] polyethylene glycol (MIRALAX / GLYCOLAX) packet Take 17 g by mouth daily as needed for mild constipation or moderate constipation.   . [DISCONTINUED] warfarin (COUMADIN) 1 MG tablet Take 4 mg by mouth daily. 3 days a week  . [DISCONTINUED] warfarin (COUMADIN) 3 MG tablet Take 3 mg by mouth daily. 4 days a week   No facility-administered encounter medications on file as of 03/12/2019.     ALLERGIES:  Allergies  Allergen Reactions  . Biaxin [Clarithromycin] Other (See Comments)    Stomach problems   . Lisinopril Swelling     PHYSICAL EXAM:  ECOG Performance status: 1  Vitals:   03/12/19 1342  BP: (!) 142/78  Pulse: 86  Resp: 16  Temp: 97.7 F (36.5 C)  SpO2: 100%   Filed  Weights   03/12/19 1342  Weight: 188 lb (85.3 kg)    Physical Exam Constitutional:      Appearance: Normal appearance. She is obese.  HENT:     Head: Normocephalic.     Right Ear: External ear normal.     Left Ear: External ear normal.     Nose: Nose normal.     Mouth/Throat:     Pharynx: Oropharynx is clear.  Eyes:     Conjunctiva/sclera: Conjunctivae normal.  Neck:     Musculoskeletal: Normal range of motion.  Cardiovascular:     Rate and Rhythm: Normal rate and regular rhythm.     Pulses: Normal pulses.     Heart sounds: Normal heart sounds.  Pulmonary:     Effort: Pulmonary effort is normal.     Breath sounds: Normal breath sounds.  Abdominal:     General: Bowel sounds are normal.  Musculoskeletal:     Comments: BLE: decrease ROM 3/5  Skin:    General: Skin is warm.  Neurological:     General: No focal deficit present.     Mental Status: She is alert. Mental status is at baseline.  Psychiatric:        Mood and Affect: Mood normal.        Behavior: Behavior normal.        Thought Content: Thought content normal.        Judgment: Judgment normal.      LABORATORY DATA:  I have reviewed the labs as listed.  CBC    Component Value Date/Time   WBC 6.1 03/11/2019 1030   RBC 3.49 (L) 03/11/2019 1030   HGB 11.3 (L) 03/11/2019 1030   HCT 35.7 (L) 03/11/2019 1030   PLT 117 (L) 03/11/2019 1030   MCV 102.3 (H) 03/11/2019 1030   MCH 32.4 03/11/2019 1030   MCHC 31.7 03/11/2019 1030   RDW 13.9 03/11/2019 1030   LYMPHSABS 1.7 03/11/2019 1030   MONOABS 0.4 03/11/2019 1030   EOSABS 0.3 03/11/2019 1030   BASOSABS 0.0 03/11/2019 1030   CMP Latest  Ref Rng & Units 03/11/2019 11/01/2018 05/02/2018  Glucose 70 - 99 mg/dL 85 93 110(H)  BUN 8 - 23 mg/dL '13 15 16  ' Creatinine 0.44 - 1.00 mg/dL 0.76 0.80 0.83  Sodium 135 - 145 mmol/L 140 141 137  Potassium 3.5 - 5.1 mmol/L 3.5 3.7 3.2(L)  Chloride 98 - 111 mmol/L 103 106 102  CO2 22 - 32 mmol/L '26 26 26  ' Calcium 8.9 - 10.3  mg/dL 9.0 9.2 9.0  Total Protein 6.5 - 8.1 g/dL 8.5(H) 8.3(H) 8.7(H)  Total Bilirubin 0.3 - 1.2 mg/dL 0.5 0.6 0.8  Alkaline Phos 38 - 126 U/L 66 67 58  AST 15 - 41 U/L 26 29 37  ALT 0 - 44 U/L '23 20 25          ' ASSESSMENT & PLAN:   Iron deficiency anemia 1.  Iron deficiency anemia: - Due to GI blood loss. - Patient is being seen by Dr. Barney Drain and underwent an extensive GI work-up for lower GI bleed in 2018, without any significant source found.  She also has a longstanding history of dyspepsia. -She last received IV iron on 08/07/2017. -She denies any bright red bleeding per rectum or melena -She reports she has been falling more.  She feels like it is from her knee giving out.  She has multiple bruises on her arms and legs.  She reports her PCP has checked her out and they are trying to get her Coumadin levels therapeutic. -Labs today reveal a stable hemoglobin of 11.3, MCV 102.3, iron 78, TIBC 293 and saturation at 27%, ferritin is elevated at 482, vitamin B12 also elevated at 1200.  Of note her CMP was significant for elevated total protein at 8.5.  Have recommended patient proceed with a serum protein electrophoresis.  Also of significance platelets are trending down currently 117,000.  Patient is on anticoagulation therapy with Coumadin.  She denies any bleeding.  Also complete a work-up for thrombocytopenia including viral panel of hepatitis C hepatitis B and any autoimmune disorders as well as inflammation markers. -Patient will return to clinic in 3 months.  2.  History of PE: -She remains on Coumadin. -INRs are monitored through her PCP. -She reports they are having problems getting her Coumadin levels therapeutic.       Orders placed this encounter:  Orders Placed This Encounter  Procedures  . CBC with Differential  . Comprehensive metabolic panel  . Pathologist smear review  . Sedimentation rate  . Multiple Myeloma Panel (SPEP&IFE w/QIG)  . ANA, IFA (with  reflex)  . Hepatitis C RNA quantitative  . Hepatitis C Antibody      Roger Shelter, [place] [phone]

## 2019-03-12 NOTE — Assessment & Plan Note (Signed)
1.  Iron deficiency anemia: - Due to GI blood loss. - Patient is being seen by Dr. Barney Drain and underwent an extensive GI work-up for lower GI bleed in 2018, without any significant source found.  She also has a longstanding history of dyspepsia. -She last received IV iron on 08/07/2017. -She denies any bright red bleeding per rectum or melena -She reports she has been falling more.  She feels like it is from her knee giving out.  She has multiple bruises on her arms and legs.  She reports her PCP has checked her out and they are trying to get her Coumadin levels therapeutic. -Labs today reveal a stable hemoglobin of 11.3, MCV 102.3, iron 78, TIBC 293 and saturation at 27%, ferritin is elevated at 482, vitamin B12 also elevated at 1200.  Of note her CMP was significant for elevated total protein at 8.5.  Have recommended patient proceed with a serum protein electrophoresis.  Also of significance platelets are trending down currently 117,000.  Patient is on anticoagulation therapy with Coumadin.  She denies any bleeding.  Also complete a work-up for thrombocytopenia including viral panel of hepatitis C hepatitis B and any autoimmune disorders as well as inflammation markers. -Patient will return to clinic in 3 months.  2.  History of PE: -She remains on Coumadin. -INRs are monitored through her PCP. -She reports they are having problems getting her Coumadin levels therapeutic.

## 2019-04-11 ENCOUNTER — Ambulatory Visit (INDEPENDENT_AMBULATORY_CARE_PROVIDER_SITE_OTHER): Payer: Medicare Other | Admitting: Gastroenterology

## 2019-04-11 ENCOUNTER — Other Ambulatory Visit: Payer: Self-pay

## 2019-04-11 ENCOUNTER — Encounter: Payer: Self-pay | Admitting: Gastroenterology

## 2019-04-11 DIAGNOSIS — K581 Irritable bowel syndrome with constipation: Secondary | ICD-10-CM

## 2019-04-11 NOTE — Progress Notes (Signed)
CC'ED TO PCP 

## 2019-04-11 NOTE — Patient Instructions (Signed)
DRINK WATER TO KEEP YOUR URINE LIGHT YELLOW.  FOLLOW A HIGH FIBER DIET.   AVOID ITEMS THAT CAUSE BLOATING & GAS. SEE INFO BELOW.  FOLLOW UP IN 6 MOS.    BLOATING AND GAS PREVENTION  Although gas may be uncomfortable and embarrassing, it is not life-threatening. Understanding causes, ways to reduce symptoms, and treatment will help most people find some relief. Points to remember . Everyone has gas in the digestive tract. Marland Kitchen People often believe normal passage of gas to be excessive. . Gas comes from two main sources: swallowed air and normal breakdown of certain foods by harmless bacteria naturally present in the large intestine. . Many foods with carbohydrates can cause gas. Fats and proteins cause little gas. . Foods that may cause gas include o beans  o vegetables, such as broccoli, cabbage, brussels sprouts, onions, artichokes, and asparagus  o fruits, such as pears, apples, and peaches  o whole grains, such as whole wheat and bran  o soft drinks and fruit drinks  o milk and milk products, such as cheese and ice cream, and packaged foods prepared with lactose, such as bread, cereal, and salad dressing  o foods containing sorbitol, such as dietetic foods and sugar free candies and gums . The most common symptoms of gas are belching, flatulence, bloating, and abdominal pain. However, some of these symptoms are often caused by an intestinal disorder, such as irritable bowel syndrome, rather than too much gas. . The most common ways to reduce the discomfort of gas are changing diet, taking nonprescription medicines, and reducing the amount of air swallowed. . Digestive enzymes, such as lactase supplements, actually help digest carbohydrates and may allow people to eat foods that normally cause gas.

## 2019-04-11 NOTE — Progress Notes (Signed)
Subjective:    Patient ID: Courtney Rogers, female    DOB: 1950-12-19, 68 y.o.   MRN: [id number]  [facility]  HPI Has questions about PROTONIX. BLOATING: NONE. GAS: SOMETIMES (FLATULENCE/BURPING). BMs: 2X/DAY, LAST NL FORMED STOOL: 1 WEEK AGO. HAD SOFT/LOOSE STOOLS LAST WEEK AND TOOK PEPTO AND SETTLED DOWN.  BLACK STOOLS AFTER PEPTO. OCCASIONAL LOWER ABDOMINAL PAIN AND USUALLY LASTS FOR COUPLE HOURS AND HAVING A BM MAKES IT BETTER.  PT DENIES FEVER, CHILLS, HEMATOCHEZIA, HEMATEMESIS, nausea, vomiting, melena,  CHEST PAIN, SHORTNESS OF BREATH,  CHANGE IN BOWEL IN HABITS, constipation, problems swallowing, OR heartburn or indigestion.  Past Medical History:  Diagnosis Date  . Acute myocardial infarction [facility]) 2009   CAD/no stent medically managed  . Anxiety disorder   . CAD (coronary artery disease)   . Hyperlipidemia   . Hypertension   . IBS (irritable bowel syndrome)   . Overactive bladder   . PE (pulmonary embolism) JUN 2014  . Presence of permanent cardiac pacemaker   . Sleep apnea    Past Surgical History:  Procedure Laterality Date  . ABDOMINAL HYSTERECTOMY    . BSO secondary to cyst    . CARDIAC CATHETERIZATION    . COLONOSCOPY  12/2009   Dr. West Carbo, propofol, normal. Next TCS 12/2019  . COLONOSCOPY N/A 05/22/2017   Procedure: COLONOSCOPY;  Surgeon: Danie Binder, MD;  Location: [facility];  Service: Endoscopy;  Laterality: N/A;  10:30am  . ESOPHAGOGASTRODUODENOSCOPY  05/11/09   schatzki ring/small hiatal hernia/path:gastritis  . ESOPHAGOGASTRODUODENOSCOPY N/A 05/22/2017   Procedure: ESOPHAGOGASTRODUODENOSCOPY (EGD);  Surgeon: Danie Binder, MD;  Location: [facility];  Service: Endoscopy;  Laterality: N/A;  . ESOPHAGOGASTRODUODENOSCOPY (EGD) WITH ESOPHAGEAL DILATION N/A 04/01/2013   [id number] at the gastroesophageal juction/multiple small polyps/mild gastritis  . GIVENS CAPSULE STUDY N/A 06/07/2017   Procedure: GIVENS CAPSULE  STUDY;  Surgeon: Danie Binder, MD;  Location: [facility];  Service: Endoscopy;  Laterality: N/A;  7:30am  . INSERT / REPLACE / REMOVE PACEMAKER     Last year per pt.(can't remember date)    Allergies  Allergen Reactions  . Biaxin [Clarithromycin] Other (See Comments)    Stomach problems   . Lisinopril Swelling   Current Outpatient Medications  Medication Sig    . acetaminophen (TYLENOL) 500 MG tablet Take 1,000 mg by mouth 2 (two) times daily as needed for moderate pain or headache.    . albuterol (PROVENTIL HFA;VENTOLIN HFA) 108 (90 BASE) MCG/ACT inhaler Inhale 2 puffs into the lungs every 6 (six) hours as needed for shortness of breath.    Marland Kitchen aspirin EC 81 MG tablet Take 81 mg by mouth at bedtime.    Marland Kitchen b complex vitamins tablet Take 1 tablet by mouth daily.    . busPIRone (BUSPAR) 5 MG tablet Take 5 mg by mouth 2 (two) times daily.    . Calcium Carbonate-Vitamin D (CALCIUM 600+D PO) Take 1 tablet by mouth 2 (two) times daily.    . chlorthalidone (HYGROTON) 25 MG tablet Take 25 mg by mouth daily.     . Cyanocobalamin (B-12) 2500 MCG TABS Take 1 capsule by mouth daily.    . fluticasone (FLONASE) 50 MCG/ACT nasal spray SPRAY 2 SPRAYS INTO EACH NOSTRIL ONCE DAILY    . gabapentin (NEURONTIN) 600 MG tablet Take 600 mg by mouth 2 (two) times daily.     Marland Kitchen ipratropium-albuterol (DUONEB) 0.5-2.5 (3) MG/3ML SOLN Supposed to be doing 4 times daily,  She has been doing as needed    . lidocaine (XYLOCAINE) 2 % solution TAKE 2 TEASPOONFULS BEFORE MEALS AND AT BEDTIME AS NEEDED MAY REPEAT EVERY 4 HOURS. (MAX OF 8 DOSES PER DAY)    . losartan (COZAAR) 100 MG tablet Take 100 mg by mouth daily.    . metoprolol succinate (TOPROL-XL) 50 MG 24 hr tablet Take 75 mg by mouth daily.     . montelukast (SINGULAIR) 10 MG tablet Take 10 mg by mouth daily.     . pantoprazole (PROTONIX) 40 MG tablet Take 1 tablet (40 mg total) by mouth 2 (two) times daily before a meal.    . potassium chloride (K-DUR,KLOR-CON)  10 MEQ tablet Take 40 mEq by mouth 2 (two) times daily.     . Probiotic Product (PROBIOTIC PO) Take 1 capsule by mouth daily. [facility].    . sertraline (ZOLOFT) 100 MG tablet Take 200 mg by mouth daily.     . Simethicone (GAS-X PO) Take 2 tablets by mouth daily as needed (gas).    . simvastatin (ZOCOR) 40 MG tablet Take 40 mg by mouth at bedtime.      . triamcinolone cream (KENALOG) 0.1 % Apply 1 application topically as needed.     . vitamin C (ASCORBIC ACID) 500 MG tablet Take 500 mg by mouth daily.    Marland Kitchen warfarin (COUMADIN) 5 MG tablet Take 5 mg by mouth at bedtime. Mon -Thurs   5 mg and Fri -Sun  2.5 mg    . clobetasol ointment (TEMOVATE) [id number] % Apply 1 application topically as needed.     . nitroGLYCERIN (NITROSTAT) 0.4 MG SL tablet Place 0.4 mg under the tongue every 5 (five) minutes as needed for chest pain.      Review of Systems PER HPI OTHERWISE ALL SYSTEMS ARE NEGATIVE.    Objective:   Physical Exam Constitutional:      General: She is not in acute distress.    Appearance: Normal appearance.  HENT:     Mouth/Throat:     Comments: MASK IN PLACE Eyes:     General: No scleral icterus.    Pupils: Pupils are equal, round, and reactive to light.  Neck:     Musculoskeletal: Normal range of motion.  Cardiovascular:     Rate and Rhythm: Normal rate and regular rhythm.     Pulses: Normal pulses.     Heart sounds: Normal heart sounds.  Pulmonary:     Effort: Pulmonary effort is normal.     Breath sounds: Normal breath sounds.  Abdominal:     General: Bowel sounds are normal.     Palpations: Abdomen is soft.     Tenderness: There is no abdominal tenderness.  Musculoskeletal:     Right lower leg: No edema.     Left lower leg: No edema.  Lymphadenopathy:     Cervical: No cervical adenopathy.  Skin:    General: Skin is warm and dry.  Neurological:     Mental Status: She is alert and oriented to person, place, and time.     Comments: NO  NEW FOCAL DEFICITS   Psychiatric:        Mood and Affect: Mood normal.     Comments: NORMAL AFFECT       Assessment & Plan:

## 2019-04-11 NOTE — Assessment & Plan Note (Signed)
SYMPTOMS FAIRLY WELL CONTROLLED.  DRINK WATER TO KEEP YOUR URINE LIGHT YELLOW. FOLLOW A HIGH FIBER DIET.  AVOID ITEMS THAT CAUSE BLOATING & GAS.  HANDOUT GIVEN. FOLLOW UP IN 6 MOS.

## 2019-04-11 NOTE — Progress Notes (Signed)
ON RECALL  °

## 2019-06-05 ENCOUNTER — Inpatient Hospital Stay (HOSPITAL_COMMUNITY): Payer: Medicare Other | Attending: Hematology

## 2019-06-12 ENCOUNTER — Ambulatory Visit (HOSPITAL_COMMUNITY): Payer: Medicare Other | Admitting: Hematology

## 2019-06-19 ENCOUNTER — Ambulatory Visit (HOSPITAL_COMMUNITY): Payer: Medicare Other | Admitting: Hematology

## 2019-06-19 ENCOUNTER — Inpatient Hospital Stay (HOSPITAL_COMMUNITY): Payer: Medicare Other | Attending: Hematology

## 2019-06-19 ENCOUNTER — Other Ambulatory Visit: Payer: Self-pay

## 2019-06-19 DIAGNOSIS — D500 Iron deficiency anemia secondary to blood loss (chronic): Secondary | ICD-10-CM | POA: Insufficient documentation

## 2019-06-19 DIAGNOSIS — Z86711 Personal history of pulmonary embolism: Secondary | ICD-10-CM | POA: Diagnosis not present

## 2019-06-19 DIAGNOSIS — D508 Other iron deficiency anemias: Secondary | ICD-10-CM

## 2019-06-19 DIAGNOSIS — Z79899 Other long term (current) drug therapy: Secondary | ICD-10-CM | POA: Diagnosis not present

## 2019-06-19 DIAGNOSIS — Z7901 Long term (current) use of anticoagulants: Secondary | ICD-10-CM | POA: Diagnosis not present

## 2019-06-19 DIAGNOSIS — K922 Gastrointestinal hemorrhage, unspecified: Secondary | ICD-10-CM | POA: Diagnosis not present

## 2019-06-19 LAB — COMPREHENSIVE METABOLIC PANEL
ALT: 20 U/L (ref 0–44)
AST: 30 U/L (ref 15–41)
Albumin: 4 g/dL (ref 3.5–5.0)
Alkaline Phosphatase: 61 U/L (ref 38–126)
Anion gap: 13 (ref 5–15)
BUN: 20 mg/dL (ref 8–23)
CO2: 24 mmol/L (ref 22–32)
Calcium: 9.4 mg/dL (ref 8.9–10.3)
Chloride: 102 mmol/L (ref 98–111)
Creatinine, Ser: 1.35 mg/dL — ABNORMAL HIGH (ref 0.44–1.00)
GFR calc Af Amer: 46 mL/min — ABNORMAL LOW (ref >60)
GFR calc non Af Amer: 40 mL/min — ABNORMAL LOW (ref >60)
Glucose, Bld: 107 mg/dL — ABNORMAL HIGH (ref 70–99)
Potassium: 3.5 mmol/L (ref 3.5–5.1)
Sodium: 139 mmol/L (ref 135–145)
Total Bilirubin: 1.1 mg/dL (ref 0.3–1.2)
Total Protein: 10 g/dL — ABNORMAL HIGH (ref 6.5–8.1)

## 2019-06-19 LAB — CBC WITH DIFFERENTIAL/PLATELET
Abs Immature Granulocytes: 0.06 10*3/uL (ref 0.00–0.07)
Basophils Absolute: 0 10*3/uL (ref 0.0–0.1)
Basophils Relative: 1 %
Eosinophils Absolute: 0.1 10*3/uL (ref 0.0–0.5)
Eosinophils Relative: 1 %
HCT: 37.9 % (ref 36.0–46.0)
Hemoglobin: 12.2 g/dL (ref 12.0–15.0)
Immature Granulocytes: 1 %
Lymphocytes Relative: 23 %
Lymphs Abs: 1.7 10*3/uL (ref 0.7–4.0)
MCH: 32.3 pg (ref 26.0–34.0)
MCHC: 32.2 g/dL (ref 30.0–36.0)
MCV: 100.3 fL — ABNORMAL HIGH (ref 80.0–100.0)
Monocytes Absolute: 0.8 10*3/uL (ref 0.1–1.0)
Monocytes Relative: 11 %
Neutro Abs: 4.7 10*3/uL (ref 1.7–7.7)
Neutrophils Relative %: 63 %
Platelets: 142 10*3/uL — ABNORMAL LOW (ref 150–400)
RBC: 3.78 MIL/uL — ABNORMAL LOW (ref 3.87–5.11)
RDW: 14.4 % (ref 11.5–15.5)
WBC: 7.5 10*3/uL (ref 4.0–10.5)
nRBC: 0 % (ref 0.0–0.2)

## 2019-06-19 LAB — SEDIMENTATION RATE: Sed Rate: 87 mm/hr — ABNORMAL HIGH (ref 0–22)

## 2019-06-19 LAB — HEPATITIS C ANTIBODY: HCV Ab: NONREACTIVE

## 2019-06-20 LAB — PATHOLOGIST SMEAR REVIEW

## 2019-06-22 LAB — MULTIPLE MYELOMA PANEL, SERUM
Albumin SerPl Elph-Mcnc: 4.1 g/dL (ref 2.9–4.4)
Albumin/Glob SerPl: 0.8 (ref 0.7–1.7)
Alpha 1: 0.2 g/dL (ref 0.0–0.4)
Alpha2 Glob SerPl Elph-Mcnc: 0.6 g/dL (ref 0.4–1.0)
B-Globulin SerPl Elph-Mcnc: 1.2 g/dL (ref 0.7–1.3)
Gamma Glob SerPl Elph-Mcnc: 3.6 g/dL — ABNORMAL HIGH (ref 0.4–1.8)
Globulin, Total: 5.6 g/dL — ABNORMAL HIGH (ref 2.2–3.9)
IgA: 4466 mg/dL — ABNORMAL HIGH (ref 87–352)
IgG (Immunoglobin G), Serum: 326 mg/dL — ABNORMAL LOW (ref 586–1602)
IgM (Immunoglobulin M), Srm: 13 mg/dL — ABNORMAL LOW (ref 26–217)
M Protein SerPl Elph-Mcnc: 2.1 g/dL — ABNORMAL HIGH
Total Protein ELP: 9.7 g/dL — ABNORMAL HIGH (ref 6.0–8.5)

## 2019-06-26 ENCOUNTER — Inpatient Hospital Stay (HOSPITAL_COMMUNITY): Payer: Medicare Other | Attending: Hematology | Admitting: Hematology

## 2019-06-26 ENCOUNTER — Other Ambulatory Visit: Payer: Self-pay

## 2019-06-26 ENCOUNTER — Encounter (HOSPITAL_COMMUNITY): Payer: Self-pay | Admitting: Hematology

## 2019-06-26 DIAGNOSIS — F419 Anxiety disorder, unspecified: Secondary | ICD-10-CM | POA: Insufficient documentation

## 2019-06-26 DIAGNOSIS — Z7901 Long term (current) use of anticoagulants: Secondary | ICD-10-CM | POA: Insufficient documentation

## 2019-06-26 DIAGNOSIS — D729 Disorder of white blood cells, unspecified: Secondary | ICD-10-CM | POA: Insufficient documentation

## 2019-06-26 DIAGNOSIS — Z86718 Personal history of other venous thrombosis and embolism: Secondary | ICD-10-CM | POA: Insufficient documentation

## 2019-06-26 DIAGNOSIS — M545 Low back pain: Secondary | ICD-10-CM | POA: Insufficient documentation

## 2019-06-26 DIAGNOSIS — Z86711 Personal history of pulmonary embolism: Secondary | ICD-10-CM | POA: Insufficient documentation

## 2019-06-26 DIAGNOSIS — D509 Iron deficiency anemia, unspecified: Secondary | ICD-10-CM | POA: Insufficient documentation

## 2019-06-26 DIAGNOSIS — M1990 Unspecified osteoarthritis, unspecified site: Secondary | ICD-10-CM | POA: Insufficient documentation

## 2019-06-26 DIAGNOSIS — G479 Sleep disorder, unspecified: Secondary | ICD-10-CM | POA: Insufficient documentation

## 2019-06-26 DIAGNOSIS — D696 Thrombocytopenia, unspecified: Secondary | ICD-10-CM | POA: Insufficient documentation

## 2019-06-26 DIAGNOSIS — G8929 Other chronic pain: Secondary | ICD-10-CM | POA: Insufficient documentation

## 2019-06-26 NOTE — Progress Notes (Signed)
Virtual Visit via Telephone Note  I connected with Courtney Rogers on 06/26/19 at  3:35 PM EST by telephone and verified that I am speaking with the correct person using two identifiers.   I discussed the limitations, risks, security and privacy concerns of performing an evaluation and management service by telephone and the availability of in person appointments. I also discussed with the patient that there may be a patient responsible charge related to this service. The patient expressed understanding and agreed to proceed.   History of Present Illness: She was followed in our clinic for iron deficiency anemia.  Last few visits her platelet count was low.  Hence detailed work-up was ordered.   Observations/Objective: She reports that she has been fairly doing well.  Denies any fevers, night sweats or weight loss.  Denies any new onset bone pains.  Reports appetite at 75% and energy levels at 50%.  Numbness in the legs has been stable.  Abdominal pains from IBS have been stable.  She reports occasional spasms in the chest which have also been stable.  Denies any bleeding per rectum or melena.  Assessment and Plan:  1.  Biclonal IgA kappa plasma cell disorder: -Serum protein electrophoresis for thrombocytopenia work-up showed M spike of 2.1 g.  Immunofixation shows biclonal IgA kappa.  Hemoglobin was 12.2.  Creatinine went up to 1.35.  Baseline is around 0.8.  Calcium is normal at 9.4.  Total protein is 10. -I have recommended multiple myeloma work-up including LDH, beta-2 microglobulin, free light chains, uric acid, 24-hour urine for total protein, UPEP and urine immunofixation. -I have discussed at length about plasma cell disorders in general and their work-up in detail.  She reports that her sister also has MGUS.  Patient lives at home with her husband and is independent of all ADLs. -We will also repeat her serum immunofixation. -I have also recommended bone marrow aspiration biopsy. -We  will also obtain a whole-body PET CT scan for evaluation of myeloma lesions. -We will see her back after the PET CT scan.  2.  Pulmonary embolism: -She has pulmonary embolism in 2015 and has been on Coumadin since then.  3.  Iron deficiency state: -Her hemoglobin is normal at 12.2.  Ferritin is 42 and percent saturation is 27.  O45 and folic acid were normal.  She had received parenteral iron therapy in the past.  4.  Thrombocytopenia: -Platelet count is 142 and improved from 117 at last visit in October. -Bone marrow biopsy will show any other pathology.   Follow Up Instructions: RTC after PET scan.   I discussed the assessment and treatment plan with the patient. The patient was provided an opportunity to ask questions and all were answered. The patient agreed with the plan and demonstrated an understanding of the instructions.   The patient was advised to call back or seek an in-person evaluation if the symptoms worsen or if the condition fails to improve as anticipated.  I provided 21 minutes of non-face-to-face time during this encounter.   Derek Jack, MD

## 2019-06-26 NOTE — Addendum Note (Signed)
Addended by: Farley Ly on: 06/26/2019 04:53 PM   Modules accepted: Orders

## 2019-06-27 ENCOUNTER — Inpatient Hospital Stay (HOSPITAL_COMMUNITY): Payer: Medicare Other

## 2019-06-27 DIAGNOSIS — Z86711 Personal history of pulmonary embolism: Secondary | ICD-10-CM | POA: Diagnosis not present

## 2019-06-27 DIAGNOSIS — D729 Disorder of white blood cells, unspecified: Secondary | ICD-10-CM

## 2019-06-27 DIAGNOSIS — Z7901 Long term (current) use of anticoagulants: Secondary | ICD-10-CM | POA: Diagnosis not present

## 2019-06-27 DIAGNOSIS — F419 Anxiety disorder, unspecified: Secondary | ICD-10-CM | POA: Diagnosis not present

## 2019-06-27 DIAGNOSIS — D696 Thrombocytopenia, unspecified: Secondary | ICD-10-CM | POA: Diagnosis not present

## 2019-06-27 DIAGNOSIS — M545 Low back pain: Secondary | ICD-10-CM | POA: Diagnosis not present

## 2019-06-27 DIAGNOSIS — M1990 Unspecified osteoarthritis, unspecified site: Secondary | ICD-10-CM | POA: Diagnosis not present

## 2019-06-27 DIAGNOSIS — G479 Sleep disorder, unspecified: Secondary | ICD-10-CM | POA: Diagnosis not present

## 2019-06-27 DIAGNOSIS — Z86718 Personal history of other venous thrombosis and embolism: Secondary | ICD-10-CM | POA: Diagnosis not present

## 2019-06-27 DIAGNOSIS — G8929 Other chronic pain: Secondary | ICD-10-CM | POA: Diagnosis not present

## 2019-06-27 DIAGNOSIS — D509 Iron deficiency anemia, unspecified: Secondary | ICD-10-CM | POA: Diagnosis present

## 2019-06-27 LAB — COMPREHENSIVE METABOLIC PANEL
ALT: 26 U/L (ref 0–44)
AST: 39 U/L (ref 15–41)
Albumin: 4 g/dL (ref 3.5–5.0)
Alkaline Phosphatase: 55 U/L (ref 38–126)
Anion gap: 11 (ref 5–15)
BUN: 20 mg/dL (ref 8–23)
CO2: 24 mmol/L (ref 22–32)
Calcium: 9.5 mg/dL (ref 8.9–10.3)
Chloride: 106 mmol/L (ref 98–111)
Creatinine, Ser: 1.06 mg/dL — ABNORMAL HIGH (ref 0.44–1.00)
GFR calc Af Amer: >60 mL/min (ref >60)
GFR calc non Af Amer: 53 mL/min — ABNORMAL LOW (ref >60)
Glucose, Bld: 108 mg/dL — ABNORMAL HIGH (ref 70–99)
Potassium: 4.3 mmol/L (ref 3.5–5.1)
Sodium: 141 mmol/L (ref 135–145)
Total Bilirubin: 0.6 mg/dL (ref 0.3–1.2)
Total Protein: 9.6 g/dL — ABNORMAL HIGH (ref 6.5–8.1)

## 2019-06-27 LAB — URIC ACID: Uric Acid, Serum: 5.8 mg/dL (ref 2.5–7.1)

## 2019-06-27 LAB — LACTATE DEHYDROGENASE: LDH: 207 U/L — ABNORMAL HIGH (ref 98–192)

## 2019-06-28 LAB — PROTEIN ELECTROPHORESIS, SERUM
A/G Ratio: 0.8 (ref 0.7–1.7)
Albumin ELP: 4.3 g/dL (ref 2.9–4.4)
Alpha-1-Globulin: 0.2 g/dL (ref 0.0–0.4)
Alpha-2-Globulin: 0.5 g/dL (ref 0.4–1.0)
Beta Globulin: 1.1 g/dL (ref 0.7–1.3)
Gamma Globulin: 3.5 g/dL — ABNORMAL HIGH (ref 0.4–1.8)
Globulin, Total: 5.3 g/dL — ABNORMAL HIGH (ref 2.2–3.9)
M-Spike, %: 2.5 g/dL — ABNORMAL HIGH
Total Protein ELP: 9.6 g/dL — ABNORMAL HIGH (ref 6.0–8.5)

## 2019-06-28 LAB — BETA 2 MICROGLOBULIN, SERUM: Beta-2 Microglobulin: 2.8 mg/L — ABNORMAL HIGH (ref 0.6–2.4)

## 2019-06-28 LAB — KAPPA/LAMBDA LIGHT CHAINS
Kappa free light chain: 21.6 mg/L — ABNORMAL HIGH (ref 3.3–19.4)
Kappa, lambda light chain ratio: 4.8 — ABNORMAL HIGH (ref 0.26–1.65)
Lambda free light chains: 4.5 mg/L — ABNORMAL LOW (ref 5.7–26.3)

## 2019-07-01 ENCOUNTER — Other Ambulatory Visit (HOSPITAL_COMMUNITY)
Admission: RE | Admit: 2019-07-01 | Discharge: 2019-07-01 | Disposition: A | Discharge status: Home / Self Care | Payer: Medicare Other | Source: Ambulatory Visit | Attending: Hematology | Admitting: Hematology

## 2019-07-01 DIAGNOSIS — D729 Disorder of white blood cells, unspecified: Secondary | ICD-10-CM | POA: Insufficient documentation

## 2019-07-02 ENCOUNTER — Other Ambulatory Visit (HOSPITAL_COMMUNITY): Payer: Self-pay | Admitting: *Deleted

## 2019-07-02 ENCOUNTER — Encounter (HOSPITAL_COMMUNITY): Payer: Medicare Other

## 2019-07-02 ENCOUNTER — Encounter (HOSPITAL_COMMUNITY): Payer: Medicare Other | Admitting: Hematology

## 2019-07-02 DIAGNOSIS — D729 Disorder of white blood cells, unspecified: Secondary | ICD-10-CM

## 2019-07-03 ENCOUNTER — Encounter (HOSPITAL_COMMUNITY): Payer: Medicare Other | Admitting: Hematology

## 2019-07-03 ENCOUNTER — Encounter (HOSPITAL_COMMUNITY): Payer: Medicare Other

## 2019-07-03 LAB — UPEP/UIFE/LIGHT CHAINS/TP, 24-HR UR
% BETA, Urine: 23 %
ALPHA 1 URINE: 1.1 %
Albumin, U: 43.4 %
Alpha 2, Urine: 15.9 %
Free Kappa Lt Chains,Ur: 41.8 mg/L (ref 0.63–113.79)
Free Kappa/Lambda Ratio: 7.56 (ref 1.03–31.76)
Free Lambda Lt Chains,Ur: 5.53 mg/L (ref 0.47–11.77)
GAMMA GLOBULIN URINE: 16.6 %
M-SPIKE %, Urine: 5.5 % — ABNORMAL HIGH
M-Spike, Mg/24 Hr: 3 mg/24 hr — ABNORMAL HIGH
Total Protein, Urine-Ur/day: 59 mg/24 hr (ref 30–150)
Total Protein, Urine: 16.8 mg/dL
Total Volume: 350

## 2019-07-08 ENCOUNTER — Ambulatory Visit (HOSPITAL_COMMUNITY)
Admission: RE | Admit: 2019-07-08 | Discharge: 2019-07-08 | Disposition: A | Discharge status: Home / Self Care | Payer: Medicare Other | Source: Ambulatory Visit | Attending: Hematology | Admitting: Hematology

## 2019-07-08 ENCOUNTER — Other Ambulatory Visit: Payer: Self-pay

## 2019-07-08 ENCOUNTER — Other Ambulatory Visit (HOSPITAL_COMMUNITY): Payer: Medicare Other

## 2019-07-08 DIAGNOSIS — D729 Disorder of white blood cells, unspecified: Secondary | ICD-10-CM | POA: Insufficient documentation

## 2019-07-08 MED ORDER — FLUDEOXYGLUCOSE F - 18 (FDG) INJECTION
11.3800 | Freq: Once | INTRAVENOUS | Status: AC | PRN
  Administered 2019-07-08: 11.38 via INTRAVENOUS

## 2019-07-09 ENCOUNTER — Inpatient Hospital Stay (HOSPITAL_BASED_OUTPATIENT_CLINIC_OR_DEPARTMENT_OTHER): Payer: Medicare Other | Admitting: *Deleted

## 2019-07-09 ENCOUNTER — Inpatient Hospital Stay (HOSPITAL_BASED_OUTPATIENT_CLINIC_OR_DEPARTMENT_OTHER): Payer: Medicare Other | Admitting: Hematology

## 2019-07-09 VITALS — BP 159/71 | HR 86 | Resp 18 | Ht 65.0 in | Wt 179.2 lb

## 2019-07-09 DIAGNOSIS — D472 Monoclonal gammopathy: Secondary | ICD-10-CM

## 2019-07-09 DIAGNOSIS — D509 Iron deficiency anemia, unspecified: Secondary | ICD-10-CM | POA: Diagnosis not present

## 2019-07-09 DIAGNOSIS — D729 Disorder of white blood cells, unspecified: Secondary | ICD-10-CM

## 2019-07-09 LAB — CBC WITH DIFFERENTIAL/PLATELET
Abs Immature Granulocytes: 0.02 10*3/uL (ref 0.00–0.07)
Basophils Absolute: 0 10*3/uL (ref 0.0–0.1)
Basophils Relative: 1 %
Eosinophils Absolute: 0.2 10*3/uL (ref 0.0–0.5)
Eosinophils Relative: 3 %
HCT: 36.7 % (ref 36.0–46.0)
Hemoglobin: 11.4 g/dL — ABNORMAL LOW (ref 12.0–15.0)
Immature Granulocytes: 0 %
Lymphocytes Relative: 26 %
Lymphs Abs: 1.7 10*3/uL (ref 0.7–4.0)
MCH: 31.4 pg (ref 26.0–34.0)
MCHC: 31.1 g/dL (ref 30.0–36.0)
MCV: 101.1 fL — ABNORMAL HIGH (ref 80.0–100.0)
Monocytes Absolute: 0.5 10*3/uL (ref 0.1–1.0)
Monocytes Relative: 8 %
Neutro Abs: 4 10*3/uL (ref 1.7–7.7)
Neutrophils Relative %: 62 %
Platelets: 140 10*3/uL — ABNORMAL LOW (ref 150–400)
RBC: 3.63 MIL/uL — ABNORMAL LOW (ref 3.87–5.11)
RDW: 14.2 % (ref 11.5–15.5)
WBC: 6.5 10*3/uL (ref 4.0–10.5)
nRBC: 0 % (ref 0.0–0.2)

## 2019-07-09 LAB — LACTATE DEHYDROGENASE: LDH: 184 U/L (ref 98–192)

## 2019-07-09 MED ORDER — LIDOCAINE HCL (PF) 1 % IJ SOLN
INTRAMUSCULAR | Status: AC
  Filled 2019-07-09: qty 10

## 2019-07-09 NOTE — Patient Instructions (Signed)
You were here today for a bone marrow biopsy and aspiration.

## 2019-07-09 NOTE — Progress Notes (Signed)
Patient arrived ambulatory today for the bone marrow procedure.  She is here by herself.    Consent obtained 0745 Patient positioned prone with arms above head around pillow: 0754 Time out: 0757 Start: [id number] Bandage applied and patient positioned supine with head of bed elevated: 0825  Patient offered drink and warm blanket  Patient discharged ambulatory and in stable condition.  She will follow up with phone visit tomorrow as scheduled.

## 2019-07-09 NOTE — Progress Notes (Signed)
INDICATION: To evaluate for multiple myeloma.   Bone Marrow Biopsy and Aspiration Procedure Note   The patient was identified by name and date of birth, prior to start of the procedure and a timeout was performed.   An informed consent was obtained after discussing potential risks including bleeding, infection and pain.  The left posterior iliac crest was palpated, cleaned with ChloraPrep, and drapes applied.  1% lidocaine is infiltrated into the skin, subcutaneous tissue and periosteum.  Bone marrow was aspirated and smears made.  With the help of Jamshidi needle a core biopsy was obtained.  Pressure was applied to the biopsy site and bandage was placed over the biopsy site. Patient was made to lie on the back for 15 mins prior to discharge.  The procedure was tolerated well. COMPLICATIONS: None BLOOD LOSS: none Patient was discharged home in stable condition to return in 2 weeks to review results.  Patient was provided with post bone marrow biopsy instructions and instructed to call if there was any bleeding or worsening pain.  Specimens sent for flow cytometry, cytogenetics and additional studies.  Signed Derek Jack, MD

## 2019-07-10 ENCOUNTER — Inpatient Hospital Stay (HOSPITAL_BASED_OUTPATIENT_CLINIC_OR_DEPARTMENT_OTHER): Payer: Medicare Other | Admitting: Hematology

## 2019-07-10 ENCOUNTER — Other Ambulatory Visit: Payer: Self-pay

## 2019-07-10 ENCOUNTER — Encounter (HOSPITAL_COMMUNITY): Payer: Self-pay | Admitting: Hematology

## 2019-07-10 DIAGNOSIS — D729 Disorder of white blood cells, unspecified: Secondary | ICD-10-CM | POA: Diagnosis not present

## 2019-07-10 NOTE — Progress Notes (Signed)
Virtual Visit via Telephone Note  I connected with Courtney Rogers on 07/10/19 at 11:00 AM EST by telephone and verified that I am speaking with the correct person using two identifiers.   I discussed the limitations, risks, security and privacy concerns of performing an evaluation and management service by telephone and the availability of in person appointments. I also discussed with the patient that there may be a patient responsible charge related to this service. The patient expressed understanding and agreed to proceed.   History of Present Illness: She was evaluated by me for biclonal IgA kappa plasma cell disorder.  She had a bone marrow biopsy done yesterday.  Her M spike showed 2.1 g/dL.  Her hemoglobin, calcium were normal.  Creatinine was elevated at 1.35.   Observations/Objective: She reports some soreness at the bone marrow biopsy site yesterday for which she took some Tylenol.  No soreness today reported.  No new onset bone pains.  Denies any fevers, night sweats or weight loss.  Appetite and energy levels are 50%.  Anxiety and sleep problems are stable.  Chronic low back pain is stable at 6 out of 10.  Assessment and Plan:  1.  Biclonal IgA kappa plasma cell disorder: -SPEP shows M spike 2.1 g.  Immunofixation shows biclonal IgA kappa. -LDH is 184.  Creatinine is 1.06 and slightly elevated.  Beta-2 microglobulin is 2.8.  Free light chain ratio is 4.8 with kappa light chains of 21.6 and lambda light chains of 4.5.  Hemoglobin is 12.2. -24-hour urine total protein was 59 mg.  Urine immunofixation shows IgA kappa monoclonal protein. -She had bone marrow biopsy done on 07/09/2019. -We reviewed results of the PET scan dated 07/09/2019 which showed no findings of active malignancy.  Scattered osteoarthritis. -I have recommended follow-up in 2 weeks to discuss the results of the bone marrow aspiration and biopsy.  We have also sent for chromosome analysis and myeloma FISH panel.  2.   Pulmonary embolism: -She had PE in 2015 and is on Coumadin since then.  3.  Iron deficiency state: -Hemoglobin is normal at 12.2.  Ferritin is 42 and percent saturation is 27.  Y60 and folic acid is normal.  She had received parenteral iron therapy in the past.  4.  Mild thrombocytopenia: -Last platelet count was 142.  She does not have any bleeding problems.   Follow Up Instructions:    I discussed the assessment and treatment plan with the patient. The patient was provided an opportunity to ask questions and all were answered. The patient agreed with the plan and demonstrated an understanding of the instructions.   The patient was advised to call back or seek an in-person evaluation if the symptoms worsen or if the condition fails to improve as anticipated.  I provided 11 minutes of non-face-to-face time during this encounter.   Derek Jack, MD

## 2019-07-11 LAB — SURGICAL PATHOLOGY

## 2019-07-12 LAB — IMMUNOFIXATION ELECTROPHORESIS
IgA: 4340 mg/dL — ABNORMAL HIGH (ref 87–352)
IgG (Immunoglobin G), Serum: 327 mg/dL — ABNORMAL LOW (ref 586–1602)
IgM (Immunoglobulin M), Srm: 13 mg/dL — ABNORMAL LOW (ref 26–217)
Total Protein ELP: 9.2 g/dL — ABNORMAL HIGH (ref 6.0–8.5)

## 2019-07-23 ENCOUNTER — Encounter (HOSPITAL_COMMUNITY): Payer: Self-pay | Admitting: Hematology

## 2019-07-25 ENCOUNTER — Encounter (HOSPITAL_COMMUNITY): Payer: Self-pay | Admitting: Hematology

## 2019-07-30 ENCOUNTER — Inpatient Hospital Stay (HOSPITAL_COMMUNITY): Payer: Medicare Other | Attending: Hematology | Admitting: Hematology

## 2019-07-30 ENCOUNTER — Encounter (HOSPITAL_COMMUNITY): Payer: Self-pay | Admitting: Hematology

## 2019-07-30 ENCOUNTER — Other Ambulatory Visit: Payer: Self-pay

## 2019-07-30 DIAGNOSIS — D472 Monoclonal gammopathy: Secondary | ICD-10-CM

## 2019-07-30 DIAGNOSIS — D696 Thrombocytopenia, unspecified: Secondary | ICD-10-CM | POA: Insufficient documentation

## 2019-07-30 DIAGNOSIS — Z86711 Personal history of pulmonary embolism: Secondary | ICD-10-CM | POA: Insufficient documentation

## 2019-07-30 DIAGNOSIS — M1990 Unspecified osteoarthritis, unspecified site: Secondary | ICD-10-CM | POA: Insufficient documentation

## 2019-07-30 DIAGNOSIS — Z7901 Long term (current) use of anticoagulants: Secondary | ICD-10-CM | POA: Insufficient documentation

## 2019-07-30 DIAGNOSIS — D509 Iron deficiency anemia, unspecified: Secondary | ICD-10-CM | POA: Insufficient documentation
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
## 2019-08-01 ENCOUNTER — Ambulatory Visit: Payer: Medicare Other | Admitting: Family Medicine

## 2019-08-19 ENCOUNTER — Other Ambulatory Visit (HOSPITAL_COMMUNITY): Payer: Self-pay | Admitting: *Deleted

## 2019-08-19 DIAGNOSIS — D472 Monoclonal gammopathy: Secondary | ICD-10-CM

## 2019-08-20 ENCOUNTER — Inpatient Hospital Stay (HOSPITAL_COMMUNITY): Payer: Medicare Other

## 2019-08-20 ENCOUNTER — Other Ambulatory Visit: Payer: Self-pay

## 2019-08-20 DIAGNOSIS — D696 Thrombocytopenia, unspecified: Secondary | ICD-10-CM | POA: Diagnosis not present

## 2019-08-20 DIAGNOSIS — D509 Iron deficiency anemia, unspecified: Secondary | ICD-10-CM | POA: Diagnosis not present

## 2019-08-20 DIAGNOSIS — Z86711 Personal history of pulmonary embolism: Secondary | ICD-10-CM | POA: Diagnosis not present

## 2019-08-20 DIAGNOSIS — M1990 Unspecified osteoarthritis, unspecified site: Secondary | ICD-10-CM | POA: Diagnosis not present

## 2019-08-20 DIAGNOSIS — D472 Monoclonal gammopathy: Secondary | ICD-10-CM

## 2019-08-20 DIAGNOSIS — Z7901 Long term (current) use of anticoagulants: Secondary | ICD-10-CM | POA: Diagnosis not present

## 2019-08-20 LAB — CBC WITH DIFFERENTIAL/PLATELET
Abs Immature Granulocytes: 0.03 10*3/uL (ref 0.00–0.07)
Basophils Absolute: 0 10*3/uL (ref 0.0–0.1)
Basophils Relative: 1 %
Eosinophils Absolute: 0.2 10*3/uL (ref 0.0–0.5)
Eosinophils Relative: 4 %
HCT: 32.6 % — ABNORMAL LOW (ref 36.0–46.0)
Hemoglobin: 10.1 g/dL — ABNORMAL LOW (ref 12.0–15.0)
Immature Granulocytes: 1 %
Lymphocytes Relative: 26 %
Lymphs Abs: 1.5 10*3/uL (ref 0.7–4.0)
MCH: 32.2 pg (ref 26.0–34.0)
MCHC: 31 g/dL (ref 30.0–36.0)
MCV: 103.8 fL — ABNORMAL HIGH (ref 80.0–100.0)
Monocytes Absolute: 0.4 10*3/uL (ref 0.1–1.0)
Monocytes Relative: 8 %
Neutro Abs: 3.5 10*3/uL (ref 1.7–7.7)
Neutrophils Relative %: 60 %
Platelets: 105 10*3/uL — ABNORMAL LOW (ref 150–400)
RBC: 3.14 MIL/uL — ABNORMAL LOW (ref 3.87–5.11)
RDW: 15.5 % (ref 11.5–15.5)
WBC: 5.7 10*3/uL (ref 4.0–10.5)
nRBC: 0 % (ref 0.0–0.2)

## 2019-08-20 LAB — COMPREHENSIVE METABOLIC PANEL WITH GFR
ALT: 19 U/L (ref 0–44)
AST: 28 U/L (ref 15–41)
Albumin: 3.5 g/dL (ref 3.5–5.0)
Alkaline Phosphatase: 50 U/L (ref 38–126)
Anion gap: 10 (ref 5–15)
BUN: 8 mg/dL (ref 8–23)
CO2: 27 mmol/L (ref 22–32)
Calcium: 8.9 mg/dL (ref 8.9–10.3)
Chloride: 104 mmol/L (ref 98–111)
Creatinine, Ser: 0.66 mg/dL (ref 0.44–1.00)
GFR calc Af Amer: >60 mL/min
GFR calc non Af Amer: >60 mL/min
Glucose, Bld: 88 mg/dL (ref 70–99)
Potassium: 3.3 mmol/L — ABNORMAL LOW (ref 3.5–5.1)
Sodium: 141 mmol/L (ref 135–145)
Total Bilirubin: 0.6 mg/dL (ref 0.3–1.2)
Total Protein: 8.4 g/dL — ABNORMAL HIGH (ref 6.5–8.1)

## 2019-08-20 LAB — LACTATE DEHYDROGENASE: LDH: 180 U/L (ref 98–192)

## 2019-08-21 LAB — PROTEIN ELECTROPHORESIS, SERUM
A/G Ratio: 0.8 (ref 0.7–1.7)
Albumin ELP: 3.8 g/dL (ref 2.9–4.4)
Alpha-1-Globulin: 0.2 g/dL (ref 0.0–0.4)
Alpha-2-Globulin: 0.5 g/dL (ref 0.4–1.0)
Beta Globulin: 1 g/dL (ref 0.7–1.3)
Gamma Globulin: 2.9 g/dL — ABNORMAL HIGH (ref 0.4–1.8)
Globulin, Total: 4.6 g/dL — ABNORMAL HIGH (ref 2.2–3.9)
M-Spike, %: 2.3 g/dL — ABNORMAL HIGH
Total Protein ELP: 8.4 g/dL (ref 6.0–8.5)

## 2019-08-21 LAB — KAPPA/LAMBDA LIGHT CHAINS
Kappa free light chain: 16.2 mg/L (ref 3.3–19.4)
Kappa, lambda light chain ratio: 4.5 — ABNORMAL HIGH (ref 0.26–1.65)
Lambda free light chains: 3.6 mg/L — ABNORMAL LOW (ref 5.7–26.3)

## 2019-08-26 ENCOUNTER — Other Ambulatory Visit: Payer: Self-pay | Admitting: Gastroenterology

## 2019-08-27 ENCOUNTER — Inpatient Hospital Stay (HOSPITAL_COMMUNITY): Payer: Medicare Other

## 2019-08-27 ENCOUNTER — Encounter (HOSPITAL_COMMUNITY): Payer: Self-pay | Admitting: Hematology

## 2019-08-27 ENCOUNTER — Inpatient Hospital Stay (HOSPITAL_COMMUNITY): Payer: Medicare Other | Attending: Hematology | Admitting: Hematology

## 2019-08-27 ENCOUNTER — Other Ambulatory Visit: Payer: Self-pay

## 2019-08-27 VITALS — BP 125/70 | HR 73 | Temp 96.9°F | Resp 18 | Wt 171.4 lb

## 2019-08-27 DIAGNOSIS — D539 Nutritional anemia, unspecified: Secondary | ICD-10-CM | POA: Diagnosis not present

## 2019-08-27 DIAGNOSIS — M1990 Unspecified osteoarthritis, unspecified site: Secondary | ICD-10-CM | POA: Insufficient documentation

## 2019-08-27 DIAGNOSIS — D472 Monoclonal gammopathy: Secondary | ICD-10-CM | POA: Diagnosis not present

## 2019-08-27 DIAGNOSIS — Z86711 Personal history of pulmonary embolism: Secondary | ICD-10-CM | POA: Diagnosis not present

## 2019-08-27 DIAGNOSIS — Z7901 Long term (current) use of anticoagulants: Secondary | ICD-10-CM | POA: Insufficient documentation

## 2019-08-27 DIAGNOSIS — D509 Iron deficiency anemia, unspecified: Secondary | ICD-10-CM

## 2019-08-27 DIAGNOSIS — Z79899 Other long term (current) drug therapy: Secondary | ICD-10-CM | POA: Diagnosis not present

## 2019-08-27 DIAGNOSIS — D729 Disorder of white blood cells, unspecified: Secondary | ICD-10-CM

## 2019-08-27 LAB — COMPREHENSIVE METABOLIC PANEL
ALT: 20 U/L (ref 0–44)
AST: 32 U/L (ref 15–41)
Albumin: 3.7 g/dL (ref 3.5–5.0)
Alkaline Phosphatase: 56 U/L (ref 38–126)
Anion gap: 8 (ref 5–15)
BUN: 7 mg/dL — ABNORMAL LOW (ref 8–23)
CO2: 26 mmol/L (ref 22–32)
Calcium: 8.9 mg/dL (ref 8.9–10.3)
Chloride: 104 mmol/L (ref 98–111)
Creatinine, Ser: 0.81 mg/dL (ref 0.44–1.00)
GFR calc Af Amer: >60 mL/min (ref >60)
GFR calc non Af Amer: >60 mL/min (ref >60)
Glucose, Bld: 94 mg/dL (ref 70–99)
Potassium: 3.9 mmol/L (ref 3.5–5.1)
Sodium: 138 mmol/L (ref 135–145)
Total Bilirubin: 0.6 mg/dL (ref 0.3–1.2)
Total Protein: 8.9 g/dL — ABNORMAL HIGH (ref 6.5–8.1)

## 2019-08-27 LAB — RETICULOCYTES
Immature Retic Fract: 19.7 % — ABNORMAL HIGH (ref 2.3–15.9)
RBC.: 3.23 MIL/uL — ABNORMAL LOW (ref 3.87–5.11)
Retic Count, Absolute: 65.9 10*3/uL (ref 19.0–186.0)
Retic Ct Pct: 2 % (ref 0.4–3.1)

## 2019-08-27 LAB — CBC WITH DIFFERENTIAL/PLATELET
Abs Immature Granulocytes: 0.02 10*3/uL (ref 0.00–0.07)
Basophils Absolute: 0 10*3/uL (ref 0.0–0.1)
Basophils Relative: 1 %
Eosinophils Absolute: 0.2 10*3/uL (ref 0.0–0.5)
Eosinophils Relative: 4 %
HCT: 34.1 % — ABNORMAL LOW (ref 36.0–46.0)
Hemoglobin: 10.5 g/dL — ABNORMAL LOW (ref 12.0–15.0)
Immature Granulocytes: 0 %
Lymphocytes Relative: 31 %
Lymphs Abs: 1.7 10*3/uL (ref 0.7–4.0)
MCH: 31.7 pg (ref 26.0–34.0)
MCHC: 30.8 g/dL (ref 30.0–36.0)
MCV: 103 fL — ABNORMAL HIGH (ref 80.0–100.0)
Monocytes Absolute: 0.5 10*3/uL (ref 0.1–1.0)
Monocytes Relative: 8 %
Neutro Abs: 3.1 10*3/uL (ref 1.7–7.7)
Neutrophils Relative %: 56 %
Platelets: 143 10*3/uL — ABNORMAL LOW (ref 150–400)
RBC: 3.31 MIL/uL — ABNORMAL LOW (ref 3.87–5.11)
RDW: 15.9 % — ABNORMAL HIGH (ref 11.5–15.5)
WBC: 5.5 10*3/uL (ref 4.0–10.5)
nRBC: 0 % (ref 0.0–0.2)

## 2019-08-27 LAB — IRON AND TIBC
Iron: 76 ug/dL (ref 28–170)
Saturation Ratios: 26 % (ref 10.4–31.8)
TIBC: 294 ug/dL (ref 250–450)
UIBC: 218 ug/dL

## 2019-08-27 LAB — FOLATE: Folate: 2.9 ng/mL — ABNORMAL LOW (ref >5.9)

## 2019-08-27 LAB — LACTATE DEHYDROGENASE: LDH: 170 U/L (ref 98–192)

## 2019-08-27 LAB — FERRITIN: Ferritin: 454 ng/mL — ABNORMAL HIGH (ref 11–307)

## 2019-08-27 LAB — VITAMIN B12: Vitamin B-12: 832 pg/mL (ref 180–914)

## 2019-08-27 MED ORDER — FOLIC ACID 1 MG PO TABS: 1.0000 mg | ORAL_TABLET | Freq: Every day | ORAL | 3 refills | Status: DC
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
## 2019-08-27 NOTE — Progress Notes (Signed)
Courtney Rogers, Courtney Rogers [id number]   CLINIC:  Medical Oncology/Hematology  PCP:  [facility] [address] [place] [address] [phone]   REASON FOR VISIT:  Follow-up for smoldering myeloma.  CURRENT THERAPY: Close observation.   INTERVAL HISTORY:  Courtney Rogers 69 y.o. female seen for follow-up of smoldering myeloma.  Denies any new significant onset pains.  Courtney Rogers reported the bilateral shoulder pains for the past 2 to 3 months.  Reports appetite and energy levels of 50%.  Numbness in the hands at times is also stable.  Denies any fevers or infections.Denies any bleeding per rectum or melena.    REVIEW OF SYSTEMS:  Review of Systems  Neurological: Positive for dizziness and numbness.  Psychiatric/Behavioral: Positive for sleep disturbance.  All other systems reviewed and are negative.    PAST MEDICAL/SURGICAL HISTORY:  Past Medical History:  Diagnosis Date  . Acute myocardial infarction [facility]) 2009   CAD/no stent medically managed  . Anxiety disorder   . CAD (coronary artery disease)   . Hyperlipidemia   . Hypertension   . IBS (irritable bowel syndrome)   . Overactive bladder   . PE (pulmonary embolism) JUN 2014  . Presence of permanent cardiac pacemaker   . Sleep apnea    Past Surgical History:  Procedure Laterality Date  . ABDOMINAL HYSTERECTOMY    . BSO secondary to cyst    . CARDIAC CATHETERIZATION    . COLONOSCOPY  12/2009   Dr. West Carbo, propofol, normal. Next TCS 12/2019  . COLONOSCOPY N/A 05/22/2017   Procedure: COLONOSCOPY;  Surgeon: Danie Binder, MD;  [facility];  Service: Endoscopy;  Laterality: N/A;  10:30am  . ESOPHAGOGASTRODUODENOSCOPY  05/11/09   schatzki ring/small hiatal hernia/path:gastritis  . ESOPHAGOGASTRODUODENOSCOPY N/A 05/22/2017   Procedure: ESOPHAGOGASTRODUODENOSCOPY (EGD);  Surgeon: Danie Binder, MD;  [facility];  Service: Endoscopy;   Laterality: N/A;  . ESOPHAGOGASTRODUODENOSCOPY (EGD) WITH ESOPHAGEAL DILATION N/A 04/01/2013   [id number] at the gastroesophageal juction/multiple small polyps/mild gastritis  . GIVENS CAPSULE STUDY N/A 06/07/2017   Procedure: GIVENS CAPSULE STUDY;  Surgeon: Danie Binder, MD;  [facility];  Service: Endoscopy;  Laterality: N/A;  7:30am  . INSERT / REPLACE / REMOVE PACEMAKER     Last year per pt.(can't remember date)     SOCIAL HISTORY:  Social History   Socioeconomic History  . Marital status: Married    Spouse name: Not on file  . Number of children: Not on file  . Years of education: Not on file  . Highest education level: Not on file  Occupational History  . Not on file  Tobacco Use  . Smoking status: Never Smoker  . Smokeless tobacco: Never Used  . Tobacco comment: Never smoked   Substance and Sexual Activity  . Alcohol use: No    Alcohol/week: 0.0 standard drinks  . Drug use: No  . Sexual activity: Not Currently  Other Topics Concern  . Not on file  Social History Narrative   Married.  Children: ONE IN [place],ONE IN [place], ONE BESIDE HER.  Lives in [place].  Eats meats fruits and vegetables. 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   Social Determinants of Health   Financial Resource Strain:   . Difficulty of Paying Living Expenses:   Food Insecurity:   . Worried About Charity fundraiser in the Last Year:   . [employer] of  Food in the Last Year:   Transportation Needs:   . Film/video editor (Medical):   Marland Kitchen Lack of Transportation (Non-Medical):   Physical Activity:   . Days of Exercise per Week:   . Minutes of Exercise per Session:   Stress:   . Feeling of Stress :   Social Connections:   . Frequency of Communication with Friends and Family:   . Frequency of Social Gatherings with Friends and Family:   . Attends Religious Services:   . Active Member of Clubs or Organizations:   . Attends Archivist  Meetings:   Marland Kitchen Marital Status:   Intimate Partner Violence:   . Fear of Current or Ex-Partner:   . Emotionally Abused:   Marland Kitchen Physically Abused:   . Sexually Abused:     FAMILY HISTORY:  Family History  Problem Relation Age of Onset  . Colon polyps Neg Hx   . Colon cancer Neg Hx     CURRENT MEDICATIONS:  Outpatient Encounter Medications as of 08/27/2019  Medication Sig  . amLODipine (NORVASC) 5 MG tablet Take 5 mg by mouth daily.  Marland Kitchen aspirin EC 81 MG tablet Take 81 mg by mouth at bedtime.  Marland Kitchen b complex vitamins tablet Take 1 tablet by mouth daily.  . busPIRone (BUSPAR) 5 MG tablet Take 5 mg by mouth 2 (two) times daily.  . Calcium Carbonate-Vitamin D (CALCIUM 600+D PO) Take 1 tablet by mouth 2 (two) times daily.  . chlorthalidone (HYGROTON) 25 MG tablet Take 25 mg by mouth daily.   . Cyanocobalamin (B-12) 2500 MCG TABS Take 1 capsule by mouth daily.  . fluticasone (FLONASE) 50 MCG/ACT nasal spray SPRAY 2 SPRAYS INTO EACH NOSTRIL ONCE DAILY  . gabapentin (NEURONTIN) 600 MG tablet Take 600 mg by mouth 2 (two) times daily.   Marland Kitchen losartan (COZAAR) 50 MG tablet Take 50 mg by mouth 2 (two) times daily.  . metoprolol succinate (TOPROL-XL) 50 MG 24 hr tablet Take 75 mg by mouth daily.   . montelukast (SINGULAIR) 10 MG tablet Take 10 mg by mouth daily.   . pantoprazole (PROTONIX) 40 MG tablet Take 1 tablet (40 mg total) by mouth 2 (two) times daily before a meal.  . potassium chloride (K-DUR,KLOR-CON) 10 MEQ tablet Take 40 mEq by mouth 2 (two) times daily.   . Probiotic Product (PROBIOTIC PO) Take 1 capsule by mouth daily. [facility] Colon [facility].  . sertraline (ZOLOFT) 100 MG tablet Take 200 mg by mouth daily.   . simvastatin (ZOCOR) 40 MG tablet Take 40 mg by mouth at bedtime.    . vitamin C (ASCORBIC ACID) 500 MG tablet Take 500 mg by mouth daily.  Marland Kitchen warfarin (COUMADIN) 5 MG tablet Take 5 mg by mouth at bedtime. Mon -Thurs   5 mg and Fri -Sun  2.5 mg  . acetaminophen (TYLENOL) 500 MG tablet Take  1,000 mg by mouth 2 (two) times daily as needed for moderate pain or headache.  . albuterol (PROVENTIL HFA;VENTOLIN HFA) 108 (90 BASE) MCG/ACT inhaler Inhale 2 puffs into the lungs every 6 (six) hours as needed for shortness of breath.  . clobetasol ointment (TEMOVATE) 0.93 % Apply 1 application topically as needed.   . folic acid (FOLVITE) 1 MG tablet Take 1 tablet (1 mg total) by mouth daily.  Marland Kitchen ipratropium-albuterol (DUONEB) 0.5-2.5 (3) MG/3ML SOLN Supposed to be doing 4 times daily,  Courtney Rogers has been doing as needed  . lidocaine (XYLOCAINE) 2 % solution TAKE 2 TEASPOONFULS BEFORE MEALS  AND AT BEDTIME AS NEEDED MAY REPEAT EVERY 4 HOURS. (MAX OF 8 DOSES PER DAY) (Patient not taking: Reported on 08/27/2019)  . nitroGLYCERIN (NITROSTAT) 0.4 MG SL tablet Place 0.4 mg under the tongue every 5 (five) minutes as needed for chest pain.   . Simethicone (GAS-X PO) Take 2 tablets by mouth daily as needed (gas).  . triamcinolone cream (KENALOG) 0.1 % Apply 1 application topically as needed.   . [DISCONTINUED] cephALEXin (KEFLEX) 250 MG capsule    No facility-administered encounter medications on file as of 08/27/2019.    ALLERGIES:  Allergies  Allergen Reactions  . Biaxin [Clarithromycin] Other (See Comments)    Stomach problems   . Lisinopril Swelling     PHYSICAL EXAM:  ECOG Performance status: 1  Vitals:   08/27/19 1435  BP: 125/70  Pulse: 73  Resp: 18  Temp: (!) 96.9 F (36.1 C)  SpO2: 98%   Filed Weights   08/27/19 1435  Weight: 171 lb 6.4 oz (77.7 kg)    Physical Exam Vitals reviewed.  Constitutional:      Appearance: Normal appearance.  Cardiovascular:     Rate and Rhythm: Normal rate and regular rhythm.     Heart sounds: Normal heart sounds.  Pulmonary:     Effort: Pulmonary effort is normal.     Breath sounds: Normal breath sounds.  Abdominal:     General: There is no distension.     Palpations: Abdomen is soft. There is no mass.  Skin:    General: Skin is warm.   Neurological:     General: No focal deficit present.     Mental Status: Courtney Rogers is alert and oriented to person, place, and time.  Psychiatric:        Mood and Affect: Mood normal.        Behavior: Behavior normal.      LABORATORY DATA:  I have reviewed the labs as listed.  CBC    Component Value Date/Time   WBC 5.5 08/27/2019 1535   RBC 3.31 (L) 08/27/2019 1535   RBC 3.23 (L) 08/27/2019 1535   HGB 10.5 (L) 08/27/2019 1535   HCT 34.1 (L) 08/27/2019 1535   PLT 143 (L) 08/27/2019 1535   MCV 103.0 (H) 08/27/2019 1535   MCH 31.7 08/27/2019 1535   MCHC 30.8 08/27/2019 1535   RDW 15.9 (H) 08/27/2019 1535   LYMPHSABS 1.7 08/27/2019 1535   MONOABS 0.5 08/27/2019 1535   EOSABS 0.2 08/27/2019 1535   BASOSABS 0.0 08/27/2019 1535   CMP Latest Ref Rng & Units 08/27/2019 08/20/2019 06/27/2019  Glucose 70 - 99 mg/dL 94 88 108(H)  BUN 8 - 23 mg/dL 7(L) 8 20  Creatinine 0.44 - 1.00 mg/dL 0.81 0.66 1.06(H)  Sodium 135 - 145 mmol/L 138 141 141  Potassium 3.5 - 5.1 mmol/L 3.9 3.3(L) 4.3  Chloride 98 - 111 mmol/L 104 104 106  CO2 22 - 32 mmol/L '26 27 24  ' Calcium 8.9 - 10.3 mg/dL 8.9 8.9 9.5  Total Protein 6.5 - 8.1 g/dL 8.9(H) 8.4(H) 9.6(H)  Total Bilirubin 0.3 - 1.2 mg/dL 0.6 0.6 0.6  Alkaline Phos 38 - 126 U/L 56 50 55  AST 15 - 41 U/L 32 28 39  ALT 0 - 44 U/L '20 19 26       ' DIAGNOSTIC IMAGING:  I have independently reviewed the scans and discussed with the patient.     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  Increased number of atypical plasma cells are present 36% of all cell lines.  Plasma cells are kappa light chain restricted. -Unfortunately her specimen has reached the cytogenetics and FISH lab week later due to bad weather and no viable plasma cells. -PET scan on 07/09/2019 showed no findings of active myeloma.  Scattered osteoarthritis. -24-hour  urine shows total protein 59 mg.  Urine immunofixation shows IgA kappa monoclonal protein.  LDH was 184. -Based on [facility] criteria (07/12/18) Courtney Rogers has high risk.  About 45-50% probability of progression to myeloma in the next 2 years. -We reviewed labs from 08/20/2019.  M spike has improved slightly to 2.3 g (2.5 g previously).  Free light chain ratio is 4.5 with kappa light chains of 16.2. -Her hemoglobin dropped to 10.1.  Hence we have checked for deficiencies. -We will closely monitor her smoldering myeloma.  We will have to repeat bone marrow biopsy if there is any significant changes.  2.  Macrocytic anemia: -Hemoglobin dropped to 10.1 from 11.4.  We repeated hemoglobin today which was 10.5.  Folic acid was low at 2.9.  Ferritin was 454. -I have called in a prescription for folic acid 1 mg tablet daily.  We will plan to repeat CBC in other labs in 6 weeks.  3.  Pulmonary embolism: -Courtney Rogers has PE in 2015 and is on Coumadin since then.  No bleeding issues reported.      Orders placed this encounter:  Orders Placed This Encounter  Procedures  . Folate  . Vitamin B12  . Iron and TIBC  . Ferritin  . Reticulocytes  . CBC with Differential  . Basic metabolic panel   Total time spent is 30 minutes with more than 50% of the time spent face-to-face discussing diagnosis, prognosis, counseling and coordination of care.   Derek Jack, MD [place] [phone]

## 2019-08-27 NOTE — Patient Instructions (Addendum)
[facility] Discharge Instructions  You were seen today by Dr. Delton Coombes. He went over your recent lab results. You will do labs today.  He will see you back in 6 weeks for labs and follow up.   Thank you for choosing [place] at [facility] to provide your oncology and hematology care.  To afford each patient quality time with our provider, please arrive at least 15 minutes before your scheduled appointment time.   If you have a lab appointment with the [facility] please come in thru the  Main Entrance and check in at the main information desk  You need to re-schedule your appointment should you arrive 10 or more minutes late.  We strive to give you quality time with our providers, and arriving late affects you and other patients whose appointments are after yours.  Also, if you no show three or more times for appointments you may be dismissed from the clinic at the providers discretion.     Again, thank you for choosing [facility]  Our hope is that these requests will decrease the amount of time that you wait before being seen by our physicians.       _____________________________________________________________  Should you have questions after your visit to [facility], please contact our office at [phone] between the hours of 8:00 a.m. and 4:30 p.m.  Voicemails left after 4:00 p.m. will not be returned until the following business day.  For prescription refill requests, have your pharmacy contact our office and allow 72 hours.    Cancer Center Support Programs:   > Cancer Support Group  2nd Tuesday of the month 1pm-2pm, Journey Room

## 2019-08-28 LAB — PROTEIN ELECTROPHORESIS, SERUM
A/G Ratio: 0.7 (ref 0.7–1.7)
Albumin ELP: 3.7 g/dL (ref 2.9–4.4)
Alpha-1-Globulin: 0.3 g/dL (ref 0.0–0.4)
Alpha-2-Globulin: 0.5 g/dL (ref 0.4–1.0)
Beta Globulin: 1.1 g/dL (ref 0.7–1.3)
Gamma Globulin: 3.1 g/dL — ABNORMAL HIGH (ref 0.4–1.8)
Globulin, Total: 5 g/dL — ABNORMAL HIGH (ref 2.2–3.9)
M-Spike, %: 1.8 g/dL — ABNORMAL HIGH
Total Protein ELP: 8.7 g/dL — ABNORMAL HIGH (ref 6.0–8.5)

## 2019-08-28 LAB — KAPPA/LAMBDA LIGHT CHAINS
Kappa free light chain: 17.9 mg/L (ref 3.3–19.4)
Kappa, lambda light chain ratio: 4.71 — ABNORMAL HIGH (ref 0.26–1.65)
Lambda free light chains: 3.8 mg/L — ABNORMAL LOW (ref 5.7–26.3)

## 2019-09-04 ENCOUNTER — Encounter: Payer: Self-pay | Admitting: Gastroenterology

## 2019-09-16 ENCOUNTER — Ambulatory Visit: Payer: Medicare Other | Admitting: Gastroenterology

## 2019-09-18 ENCOUNTER — Other Ambulatory Visit: Payer: Self-pay

## 2019-09-18 ENCOUNTER — Encounter: Payer: Self-pay | Admitting: Gastroenterology

## 2019-09-18 ENCOUNTER — Ambulatory Visit (INDEPENDENT_AMBULATORY_CARE_PROVIDER_SITE_OTHER): Payer: Medicare Other | Admitting: Gastroenterology

## 2019-09-18 DIAGNOSIS — K581 Irritable bowel syndrome with constipation: Secondary | ICD-10-CM | POA: Diagnosis not present

## 2019-09-18 DIAGNOSIS — R634 Abnormal weight loss: Secondary | ICD-10-CM

## 2019-09-18 DIAGNOSIS — M545 Low back pain, unspecified: Secondary | ICD-10-CM

## 2019-09-18 DIAGNOSIS — K219 Gastro-esophageal reflux disease without esophagitis: Secondary | ICD-10-CM | POA: Diagnosis not present

## 2019-09-18 NOTE — Progress Notes (Signed)
Subjective:    Patient ID: Courtney Rogers, female    DOB: 1950/07/30, 69 y.o.   MRN: [id number]  [facility]  HPI As long as she behaves herself HER ABDOMINAL PAIN IS FAIRLY WELL CONTROLLED. ATE TOSSED SALAD AND IT MADE STOMACH PAIN MOSTLY. HAD EPISODE OF ABDOMINAL PAIN BETTER WITH LDIOCIANE. USE MOM FOR CONSTIPATION. WEIGHT UP: 169 LBS DEC 2019 TO 172 LBS TODAY.  PT DENIES FEVER, CHILLS, HEMATOCHEZIA, HEMATEMESIS, nausea, vomiting, melena, diarrhea, CHEST PAIN,SHORTNESS OF BREATH, CHANGE IN BOWEL IN HABITS, problems swallowing, OR heartburn or indigestion.  Past Medical History:  Diagnosis Date  . Acute myocardial infarction [facility]) 2009   CAD/no stent medically managed  . Anxiety disorder   . CAD (coronary artery disease)   . Hyperlipidemia   . Hypertension   . IBS (irritable bowel syndrome)   . Overactive bladder   . PE (pulmonary embolism) JUN 2014  . Presence of permanent cardiac pacemaker   . Sleep apnea     Past Surgical History:  Procedure Laterality Date  . ABDOMINAL HYSTERECTOMY    . BSO secondary to cyst    . CARDIAC CATHETERIZATION    . COLONOSCOPY  12/2009   Dr. West Carbo, propofol, normal. Next TCS 12/2019  . COLONOSCOPY N/A 05/22/2017   Procedure: COLONOSCOPY;  Surgeon: Danie Binder, MD;  [facility];  Service: Endoscopy;  Laterality: N/A;  10:30am  . ESOPHAGOGASTRODUODENOSCOPY  05/11/09   schatzki ring/small hiatal hernia/path:gastritis  . ESOPHAGOGASTRODUODENOSCOPY N/A 05/22/2017   Procedure: ESOPHAGOGASTRODUODENOSCOPY (EGD);  Surgeon: Danie Binder, MD;  [facility];  Service: Endoscopy;  Laterality: N/A;  . ESOPHAGOGASTRODUODENOSCOPY (EGD) WITH ESOPHAGEAL DILATION N/A 04/01/2013   [id number] at the gastroesophageal juction/multiple small polyps/mild gastritis  . GIVENS CAPSULE STUDY N/A 06/07/2017   Procedure: GIVENS CAPSULE STUDY;  Surgeon: Danie Binder, MD;  [facility];  Service:  Endoscopy;  Laterality: N/A;  7:30am  . INSERT / REPLACE / REMOVE PACEMAKER     Last year per pt.(can't remember date)   Allergies  Allergen Reactions  . Biaxin [Clarithromycin] Other (See Comments)    Stomach problems   . Lisinopril Swelling    Current Outpatient Medications  Medication Sig    . acetaminophen (TYLENOL) 500 MG tablet Take 1,000 mg by mouth 2 (two) times daily as needed for moderate pain or headache.    . albuterol (PROVENTIL HFA;VENTOLIN HFA) 108 (90 BASE) MCG/ACT inhaler Inhale 2 puffs into the lungs every 6 (six) hours as needed for shortness of breath.    Marland Kitchen amLODipine (NORVASC) 5 MG tablet Take 5 mg by mouth daily.    Marland Kitchen aspirin EC 81 MG tablet Take 81 mg by mouth at bedtime.    Marland Kitchen b complex vitamins tablet Take 1 tablet by mouth daily.    . busPIRone (BUSPAR) 5 MG tablet Take 5 mg by mouth 2 (two) times daily.    . Calcium Carbonate-Vitamin D (CALCIUM 600+D PO) Take 1 tablet by mouth 2 (two) times daily.    . chlorthalidone (HYGROTON) 25 MG tablet Take 25 mg by mouth daily.     . clobetasol ointment (TEMOVATE) [id number] % Apply 1 application topically as needed.     . Cyanocobalamin (B-12) 2500 MCG TABS Take 1 capsule by mouth daily.    . fluticasone (FLONASE) 50 MCG/ACT nasal spray SPRAY 2 SPRAYS INTO EACH NOSTRIL ONCE DAILY    . folic acid (FOLVITE) 1 MG tablet Take 1 tablet (1  mg total) by mouth daily.    Marland Kitchen gabapentin (NEURONTIN) 600 MG tablet Take 600 mg by mouth 2 (two) times daily.     Marland Kitchen ipratropium-albuterol (DUONEB) 0.5-2.5 (3) MG/3ML SOLN Supposed to be doing 4 times daily,  She has been doing as needed    . lidocaine (XYLOCAINE) 2 % solution TAKE 2 TEASPOONFULS BEFORE MEALS AND AT BEDTIME AS NEEDED MAY REPEAT EVERY 4 HOURS. (MAX OF 8 DOSES PER DAY)    . losartan (COZAAR) 50 MG tablet Take 50 mg by mouth 2 (two) times daily.    . metoprolol succinate (TOPROL-XL) 50 MG 24 hr tablet Take 75 mg by mouth daily.     . montelukast (SINGULAIR) 10 MG tablet Take 10 mg by  mouth daily.     . nitroGLYCERIN (NITROSTAT) 0.4 MG SL tablet Place 0.4 mg under the tongue every 5 (five) minutes as needed for chest pain.     . pantoprazole (PROTONIX) 40 MG tablet Take 1 tablet (40 mg total) by mouth 2 (two) times daily before a meal.    . potassium chloride (K-DUR,KLOR-CON) 10 MEQ tablet Take 40 mEq by mouth 2 (two) times daily.     . Probiotic Product (PROBIOTIC PO) Take 1 capsule by mouth daily. [facility] Colon [facility].    . sertraline (ZOLOFT) 100 MG tablet Take 200 mg by mouth daily.     . Simethicone (GAS-X PO) Take 2 tablets by mouth daily as needed (gas).    . simvastatin (ZOCOR) 40 MG tablet Take 40 mg by mouth at bedtime.      . triamcinolone cream (KENALOG) 0.1 % Apply 1 application topically as needed.     . vitamin C (ASCORBIC ACID) 500 MG tablet Take 500 mg by mouth daily.    Marland Kitchen warfarin (COUMADIN) 5 MG tablet Take 5 mg by mouth at bedtime. Mon -Thurs   5 mg and Fri -Sun  2.5 mg     Review of Systems PER HPI OTHERWISE ALL SYSTEMS ARE NEGATIVE.    Objective:   Physical Exam Constitutional:      General: She is not in acute distress.    Appearance: Normal appearance.  HENT:     Mouth/Throat:     Comments: MASK IN PLACE Eyes:     General: No scleral icterus.    Pupils: Pupils are equal, round, and reactive to light.  Cardiovascular:     Rate and Rhythm: Normal rate and regular rhythm.     Pulses: Normal pulses.     Heart sounds: Normal heart sounds.  Pulmonary:     Effort: Pulmonary effort is normal.     Breath sounds: Normal breath sounds.  Abdominal:     General: Bowel sounds are normal.     Palpations: Abdomen is soft.     Tenderness: There is abdominal tenderness (MILD EPIGASTRIC TENDERNESS). There is no guarding.  Musculoskeletal:     Cervical back: Normal range of motion.     Right lower leg: No edema.     Left lower leg: No edema.  Lymphadenopathy:     Cervical: No cervical adenopathy.  Skin:    General: Skin is warm and dry.    Neurological:     Mental Status: She is alert and oriented to person, place, and time.     Comments: NO  NEW FOCAL DEFICITS  Psychiatric:        Mood and Affect: Mood normal.     Comments: NORMAL AFFECT  Assessment & Plan:

## 2019-09-18 NOTE — Assessment & Plan Note (Signed)
SYMPTOMS FAIRLY WELL CONTROLLED.  DRINK WATER TO KEEP YOUR URINE LIGHT YELLOW. USE MILK OF MAGNESIA PILLS OR LIQUID UP TO THREE TIMES A DAY TO REDUCE CONSTIPATION. FOLLOW UP IN 6 MOS WITH ERIC GILL.

## 2019-09-18 NOTE — Progress Notes (Signed)
CC'ED TO PCP 

## 2019-09-18 NOTE — Patient Instructions (Addendum)
DRINK WATER TO KEEP YOUR URINE LIGHT YELLOW.  AVOID REFLUX TRIGGERS. SEE INFO BELOW.   CONTINUE PROTONIX. TAKE 30 MINUTES PRIOR TO MEALS ONCE OR TWICE DAILY.  USE VISCOUS LIDOCAINE WHEN NEEDED TO HELP WITH HEARTBURN OR UPPER ABDOMINAL PAIN.  USE MILK OF MAGNESIA PILLS OR LIQUID UP TO THREE TIMES A DAY TO REDUCE CONSTIPATION.    PRACTICE CHAIR YOGA FOR 15-30 MINS 3 OR 4 TIMES A WEEK.  FOLLOW UP IN 6 MOS WITH ERIC GILL.    Lifestyle and home remedies TO MANAGE REFLUX/CHEST PAIN  You may eliminate or reduce the frequency of heartburn by making the following lifestyle changes:  . Control your weight. Being overweight is a major risk factor for heartburn and GERD. Excess pounds put pressure on your abdomen, pushing up your stomach and causing acid to back up into your esophagus.   . Eat smaller meals. 4 TO 6 MEALS A DAY. This reduces pressure on the lower esophageal sphincter, helping to prevent the valve from opening and acid from washing back into your esophagus.   Dolphus Jenny your belt. Clothes that fit tightly around your waist put pressure on your abdomen and the lower esophageal sphincter.   . Eliminate heartburn triggers. Everyone has specific triggers. Common triggers such as fatty or fried foods, spicy food, tomato sauce, carbonated beverages, alcohol, chocolate, mint, garlic, onion, caffeine and nicotine may make heartburn worse.   Marland Kitchen Avoid stooping or bending. Tying your shoes is OK. Bending over for longer periods to weed your garden isn't, especially soon after eating.   . Don't lie down after a meal. Wait at least three to four hours after eating before going to bed, and don't lie down right after eating.   Marland Kitchen PUT THE HEAD OF YOUR BED ON 6 INCH BLOCKS.   Alternative medicine . Several home remedies exist for treating GERD, but they provide only temporary relief. They include drinking baking soda (sodium bicarbonate) added to water or drinking other fluids such as baking  soda mixed with cream of tartar and water.  . Although these liquids create temporary relief by neutralizing, washing away or buffering acids, eventually they aggravate the situation by adding gas and fluid to your stomach, increasing pressure and causing more acid reflux. Further, adding more sodium to your diet may increase your blood pressure and add stress to your heart, and excessive bicarbonate ingestion can alter the acid-base balance in your body.

## 2019-09-18 NOTE — Assessment & Plan Note (Addendum)
NO TAILBONE PAIN. LOWER BACK PAIN UNCONTROLLED.  PRACTICE CHAIR YOGA FOR 15-30 MINS 3 OR 4 TIMES A WEEK. HANDOUT GIVEN AND DEMONSTRATED. FOLLOW UP IN 6 MOS.

## 2019-09-18 NOTE — Assessment & Plan Note (Signed)
STABLE.  CONTINUE TO MONITOR SYMPTOMS. FOLLOW UP IN 6 MOS.

## 2019-09-18 NOTE — Assessment & Plan Note (Signed)
SYMPTOMS FAIRLY WELL CONTROLLED.  DRINK WATER TO KEEP YOUR URINE LIGHT YELLOW. AVOID REFLUX TRIGGERS.  HANDOUT GIVEN. CONTINUE PROTONIX. TAKE 30 MINUTES PRIOR TO MEALS ONCE OR TWICE DAILY. USE VISCOUS LIDOCAINE WHEN NEEDED TO HELP WITH HEARTBURN OR UPPER ABDOMINAL PAIN. FOLLOW UP IN 6 MOS WITH ERIC GILL.

## 2019-10-08 ENCOUNTER — Other Ambulatory Visit: Payer: Self-pay

## 2019-10-08 ENCOUNTER — Inpatient Hospital Stay (HOSPITAL_COMMUNITY): Payer: Medicare Other | Attending: Hematology

## 2019-10-08 DIAGNOSIS — K589 Irritable bowel syndrome without diarrhea: Secondary | ICD-10-CM | POA: Insufficient documentation

## 2019-10-08 DIAGNOSIS — Z79899 Other long term (current) drug therapy: Secondary | ICD-10-CM | POA: Insufficient documentation

## 2019-10-08 DIAGNOSIS — C9000 Multiple myeloma not having achieved remission: Secondary | ICD-10-CM | POA: Diagnosis present

## 2019-10-08 DIAGNOSIS — I252 Old myocardial infarction: Secondary | ICD-10-CM | POA: Diagnosis not present

## 2019-10-08 DIAGNOSIS — Z7901 Long term (current) use of anticoagulants: Secondary | ICD-10-CM | POA: Insufficient documentation

## 2019-10-08 DIAGNOSIS — E785 Hyperlipidemia, unspecified: Secondary | ICD-10-CM | POA: Diagnosis not present

## 2019-10-08 DIAGNOSIS — D539 Nutritional anemia, unspecified: Secondary | ICD-10-CM | POA: Diagnosis not present

## 2019-10-08 DIAGNOSIS — I2510 Atherosclerotic heart disease of native coronary artery without angina pectoris: Secondary | ICD-10-CM | POA: Insufficient documentation

## 2019-10-08 DIAGNOSIS — D472 Monoclonal gammopathy: Secondary | ICD-10-CM

## 2019-10-08 DIAGNOSIS — I10 Essential (primary) hypertension: Secondary | ICD-10-CM | POA: Diagnosis not present

## 2019-10-08 DIAGNOSIS — F419 Anxiety disorder, unspecified: Secondary | ICD-10-CM | POA: Diagnosis not present

## 2019-10-08 DIAGNOSIS — Z86711 Personal history of pulmonary embolism: Secondary | ICD-10-CM | POA: Diagnosis not present

## 2019-10-08 DIAGNOSIS — Z7982 Long term (current) use of aspirin: Secondary | ICD-10-CM | POA: Insufficient documentation

## 2019-10-08 DIAGNOSIS — G4730 Sleep apnea, unspecified: Secondary | ICD-10-CM | POA: Diagnosis not present

## 2019-10-08 DIAGNOSIS — Z950 Presence of cardiac pacemaker: Secondary | ICD-10-CM | POA: Insufficient documentation

## 2019-10-08 DIAGNOSIS — D509 Iron deficiency anemia, unspecified: Secondary | ICD-10-CM

## 2019-10-08 LAB — CBC WITH DIFFERENTIAL/PLATELET
Abs Immature Granulocytes: 0.03 10*3/uL (ref 0.00–0.07)
Basophils Absolute: 0 10*3/uL (ref 0.0–0.1)
Basophils Relative: 1 %
Eosinophils Absolute: 0.3 10*3/uL (ref 0.0–0.5)
Eosinophils Relative: 5 %
HCT: 34.2 % — ABNORMAL LOW (ref 36.0–46.0)
Hemoglobin: 10.9 g/dL — ABNORMAL LOW (ref 12.0–15.0)
Immature Granulocytes: 1 %
Lymphocytes Relative: 32 %
Lymphs Abs: 1.8 10*3/uL (ref 0.7–4.0)
MCH: 32.9 pg (ref 26.0–34.0)
MCHC: 31.9 g/dL (ref 30.0–36.0)
MCV: 103.3 fL — ABNORMAL HIGH (ref 80.0–100.0)
Monocytes Absolute: 0.4 10*3/uL (ref 0.1–1.0)
Monocytes Relative: 7 %
Neutro Abs: 2.9 10*3/uL (ref 1.7–7.7)
Neutrophils Relative %: 54 %
Platelets: 115 10*3/uL — ABNORMAL LOW (ref 150–400)
RBC: 3.31 MIL/uL — ABNORMAL LOW (ref 3.87–5.11)
RDW: 14.6 % (ref 11.5–15.5)
WBC: 5.4 10*3/uL (ref 4.0–10.5)
nRBC: 0 % (ref 0.0–0.2)

## 2019-10-08 LAB — BASIC METABOLIC PANEL
Anion gap: 14 (ref 5–15)
BUN: 19 mg/dL (ref 8–23)
CO2: 27 mmol/L (ref 22–32)
Calcium: 9.3 mg/dL (ref 8.9–10.3)
Chloride: 102 mmol/L (ref 98–111)
Creatinine, Ser: 0.93 mg/dL (ref 0.44–1.00)
GFR calc Af Amer: >60 mL/min (ref >60)
GFR calc non Af Amer: >60 mL/min (ref >60)
Glucose, Bld: 102 mg/dL — ABNORMAL HIGH (ref 70–99)
Potassium: 3.7 mmol/L (ref 3.5–5.1)
Sodium: 143 mmol/L (ref 135–145)

## 2019-10-15 ENCOUNTER — Inpatient Hospital Stay (HOSPITAL_BASED_OUTPATIENT_CLINIC_OR_DEPARTMENT_OTHER): Payer: Medicare Other | Admitting: Hematology

## 2019-10-15 ENCOUNTER — Other Ambulatory Visit: Payer: Self-pay

## 2019-10-15 VITALS — BP 127/74 | HR 79 | Temp 97.9°F | Resp 18 | Wt 177.1 lb

## 2019-10-15 DIAGNOSIS — C9000 Multiple myeloma not having achieved remission: Secondary | ICD-10-CM | POA: Diagnosis not present

## 2019-10-15 DIAGNOSIS — D509 Iron deficiency anemia, unspecified: Secondary | ICD-10-CM | POA: Diagnosis not present

## 2019-10-15 DIAGNOSIS — D472 Monoclonal gammopathy: Secondary | ICD-10-CM

## 2019-10-15 NOTE — Progress Notes (Signed)
[address], [place] [address]   CLINIC:  Medical Oncology/Hematology  PCP:  [facility] / [place] [address]  [phone]  REASON FOR VISIT:  Follow-up for smoldering myeloma  CURRENT THERAPY: observation  INTERVAL HISTORY:  Courtney Rogers 69 y.o. female returns for routine follow-up for smoldering myeloma. Courtney Rogers was last seen on 08/27/2019.  She reports appetite 100%.  Energy levels are 50%.  Right knee pain rated as 6 out of 10 and stable.  Denies fevers, night sweats or weight loss.  No new onset bone pains.   REVIEW OF SYSTEMS:  Review of Systems  Constitutional: Negative for appetite change, chills, diaphoresis, fatigue and fever.  HENT:   Negative for mouth sores, sore throat and trouble swallowing.   Eyes: Negative for eye problems.  Respiratory: Negative for cough, shortness of breath and wheezing.   Cardiovascular: Negative for chest pain, leg swelling and palpitations.  Gastrointestinal: Negative for abdominal pain, constipation, diarrhea, nausea and vomiting.  Genitourinary: Negative for bladder incontinence, dysuria and frequency.   Musculoskeletal: Positive for arthralgias (knees). Negative for back pain and myalgias.       Recent fall  Skin: Negative for rash.  Neurological: Negative for dizziness, extremity weakness, headaches and numbness.  Hematological: Does not bruise/bleed easily.  Psychiatric/Behavioral: Negative for depression and sleep disturbance. The patient is not nervous/anxious.     PAST MEDICAL/SURGICAL HISTORY:  Past Medical History:  Diagnosis Date  . Acute myocardial infarction [facility]) 2009   CAD/no stent medically managed  . Anxiety disorder   . CAD (coronary artery disease)   . Hyperlipidemia   . Hypertension   . IBS (irritable bowel syndrome)   . Overactive bladder   . PE (pulmonary embolism) JUN 2014  . Presence of permanent cardiac pacemaker   . Sleep  apnea    Past Surgical History:  Procedure Laterality Date  . ABDOMINAL HYSTERECTOMY    . BSO secondary to cyst    . CARDIAC CATHETERIZATION    . COLONOSCOPY  12/2009   Dr. [place] Carbo, propofol, normal. Next TCS 12/2019  . COLONOSCOPY N/A 05/22/2017   Procedure: COLONOSCOPY;  Surgeon: Danie Binder, MD;  Location: [facility];  Service: Endoscopy;  Laterality: N/A;  10:30am  . ESOPHAGOGASTRODUODENOSCOPY  05/11/09   schatzki ring/small hiatal hernia/path:gastritis  . ESOPHAGOGASTRODUODENOSCOPY N/A 05/22/2017   Procedure: ESOPHAGOGASTRODUODENOSCOPY (EGD);  Surgeon: Danie Binder, MD;  Location: [facility];  Service: Endoscopy;  Laterality: N/A;  . ESOPHAGOGASTRODUODENOSCOPY (EGD) WITH ESOPHAGEAL DILATION N/A 04/01/2013   [id number] at the gastroesophageal juction/multiple small polyps/mild gastritis  . GIVENS CAPSULE STUDY N/A 06/07/2017   Procedure: GIVENS CAPSULE STUDY;  Surgeon: Danie Binder, MD;  Location: [facility];  Service: Endoscopy;  Laterality: N/A;  7:30am  . INSERT / REPLACE / REMOVE PACEMAKER     Last year per pt.(can't remember date)    SOCIAL HISTORY:  Social History   Socioeconomic History  . Marital status: Married    Spouse name: Not on file  . Number of children: Not on file  . Years of education: Not on file  . Highest education level: Not on file  Occupational History  . Not on file  Tobacco Use  . Smoking status: Never Smoker  . Smokeless tobacco: Never Used  . Tobacco comment: Never smoked   Substance and Sexual Activity  . Alcohol use: No    Alcohol/week: 0.0 standard drinks  . Drug  use: No  . Sexual activity: Not Currently  Other Topics Concern  . Not on file  Social History Narrative   Married.  Children: ONE IN [place],ONE IN [place], ONE BESIDE HER.  Lives in [place].  Eats meats fruits and vegetables. 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   Social Determinants of Health   Financial Resource Strain:    . Difficulty of Paying Living Expenses:   Food Insecurity:   . Worried About Charity fundraiser in the Last Year:   . Arboriculturist in the Last Year:   Transportation Needs:   . Film/video editor (Medical):   Marland Kitchen Lack of Transportation (Non-Medical):   Physical Activity:   . Days of Exercise per Week:   . Minutes of Exercise per Session:   Stress:   . Feeling of Stress :   Social Connections:   . Frequency of Communication with Friends and Family:   . Frequency of Social Gatherings with Friends and Family:   . Attends Religious Services:   . Active Member of Clubs or Organizations:   . Attends Archivist Meetings:   Marland Kitchen Marital Status:   Intimate Partner Violence:   . Fear of Current or Ex-Partner:   . Emotionally Abused:   Marland Kitchen Physically Abused:   . Sexually Abused:     FAMILY HISTORY:  Family History  Problem Relation Age of Onset  . Colon polyps Neg Hx   . Colon cancer Neg Hx     CURRENT MEDICATIONS:  Current Outpatient Medications  Medication Sig Dispense Refill  . acetaminophen (TYLENOL) 500 MG tablet Take 1,000 mg by mouth 2 (two) times daily as needed for moderate pain or headache.    . albuterol (PROVENTIL HFA;VENTOLIN HFA) 108 (90 BASE) MCG/ACT inhaler Inhale 2 puffs into the lungs every 6 (six) hours as needed for shortness of breath.    Marland Kitchen amLODipine (NORVASC) 5 MG tablet Take 5 mg by mouth daily.    Marland Kitchen aspirin EC 81 MG tablet Take 81 mg by mouth at bedtime.    Marland Kitchen b complex vitamins tablet Take 1 tablet by mouth daily.    . busPIRone (BUSPAR) 5 MG tablet Take 5 mg by mouth 2 (two) times daily.    . Calcium Carbonate-Vitamin D (CALCIUM 600+D PO) Take 1 tablet by mouth 2 (two) times daily.    . chlorthalidone (HYGROTON) 25 MG tablet Take 25 mg by mouth daily.     . clobetasol ointment (TEMOVATE) [id number] % Apply 1 application topically as needed.     . Cyanocobalamin (B-12) 2500 MCG TABS Take 1 capsule by mouth daily.    . fluticasone (FLONASE) 50 MCG/ACT  nasal spray SPRAY 2 SPRAYS INTO EACH NOSTRIL ONCE DAILY 16 g 0  . folic acid (FOLVITE) 1 MG tablet Take 1 tablet (1 mg total) by mouth daily. 30 tablet 3  . gabapentin (NEURONTIN) 600 MG tablet Take 600 mg by mouth 2 (two) times daily.     Marland Kitchen ipratropium-albuterol (DUONEB) 0.5-2.5 (3) MG/3ML SOLN Supposed to be doing 4 times daily,  She has been doing as needed    . lidocaine (XYLOCAINE) 2 % solution TAKE 2 TEASPOONFULS BEFORE MEALS AND AT BEDTIME AS NEEDED MAY REPEAT EVERY 4 HOURS. (MAX OF 8 DOSES PER DAY) 300 mL 5  . losartan (COZAAR) 50 MG tablet Take 50 mg by mouth 2 (two) times daily.    . metoprolol succinate (TOPROL-XL) 50 MG 24 hr tablet Take  75 mg by mouth daily.     . montelukast (SINGULAIR) 10 MG tablet Take 10 mg by mouth daily.     . nitroGLYCERIN (NITROSTAT) 0.4 MG SL tablet Place 0.4 mg under the tongue every 5 (five) minutes as needed for chest pain.     . pantoprazole (PROTONIX) 40 MG tablet Take 1 tablet (40 mg total) by mouth 2 (two) times daily before a meal. 60 tablet 11  . potassium chloride (K-DUR,KLOR-CON) 10 MEQ tablet Take 40 mEq by mouth 2 (two) times daily.     . Probiotic Product (PROBIOTIC PO) Take 1 capsule by mouth daily. [employer] Colon [facility].    . sertraline (ZOLOFT) 100 MG tablet Take 200 mg by mouth daily.     . Simethicone (GAS-X PO) Take 2 tablets by mouth daily as needed (gas).    . simvastatin (ZOCOR) 40 MG tablet Take 40 mg by mouth at bedtime.      . triamcinolone cream (KENALOG) 0.1 % Apply 1 application topically as needed.     . vitamin C (ASCORBIC ACID) 500 MG tablet Take 500 mg by mouth daily.    Marland Kitchen warfarin (COUMADIN) 5 MG tablet Take 5 mg by mouth at bedtime. Mon -Thurs   5 mg and Fri -Sun  2.5 mg     No current facility-administered medications for this visit.    ALLERGIES:  Allergies  Allergen Reactions  . Biaxin [Clarithromycin] Other (See Comments)    Stomach problems   . Lisinopril Swelling    PHYSICAL EXAM:  Performance status (ECOG):  1 - Symptomatic but completely ambulatory  There were no vitals filed for this visit. Wt Readings from Last 3 Encounters:  09/18/19 172 lb 3.2 oz (78.1 kg)  08/27/19 171 lb 6.4 oz (77.7 kg)  07/09/19 179 lb 3.2 oz (81.3 kg)   Physical Exam Constitutional:      General: She is not in acute distress.    Appearance: Normal appearance. She is normal weight. She is not ill-appearing.  HENT:     Mouth/Throat:     Mouth: Mucous membranes are moist.     Pharynx: No oropharyngeal exudate or posterior oropharyngeal erythema.  Eyes:     Extraocular Movements: Extraocular movements intact.     Pupils: Pupils are equal, round, and reactive to light.  Cardiovascular:     Rate and Rhythm: Normal rate and regular rhythm.     Pulses: Normal pulses.     Heart sounds: Normal heart sounds. No murmur. No friction rub. No gallop.   Pulmonary:     Effort: Pulmonary effort is normal.     Breath sounds: Normal breath sounds. No wheezing, rhonchi or rales.  Abdominal:     Palpations: There is no mass.     Tenderness: There is no abdominal tenderness. There is no guarding.  Musculoskeletal:        General: Signs of injury (Rt knee s/p fall) present. No swelling or tenderness.     Right lower leg: No edema.     Left lower leg: No edema.  Skin:    Findings: Bruising (left knee & arms) present. No erythema.  Neurological:     Mental Status: She is alert and oriented to person, place, and time.     Sensory: No sensory deficit.  Psychiatric:        Mood and Affect: Mood normal.        Behavior: Behavior normal.        Thought Content: Thought content normal.  Judgment: Judgment normal.     LABORATORY DATA:  I have reviewed the labs as listed.  CBC Latest Ref Rng & Units 10/08/2019 08/27/2019 08/20/2019  WBC 4.0 - 10.5 K/uL 5.4 5.5 5.7  Hemoglobin 12.0 - 15.0 g/dL 10.9(L) 10.5(L) 10.1(L)  Hematocrit 36.0 - 46.0 % 34.2(L) 34.1(L) 32.6(L)  Platelets 150 - 400 K/uL 115(L) 143(L) 105(L)   CMP  Latest Ref Rng & Units 10/08/2019 08/27/2019 08/20/2019  Glucose 70 - 99 mg/dL 102(H) 94 88  BUN 8 - 23 mg/dL 19 7(L) 8  Creatinine 0.44 - 1.00 mg/dL 0.93 0.81 0.66  Sodium 135 - 145 mmol/L 143 138 141  Potassium 3.5 - 5.1 mmol/L 3.7 3.9 3.3(L)  Chloride 98 - 111 mmol/L 102 104 104  CO2 22 - 32 mmol/L 27 26 27   Calcium 8.9 - 10.3 mg/dL 9.3 8.9 8.9  Total Protein 6.5 - 8.1 g/dL - 8.9(H) 8.4(H)  Total Bilirubin 0.3 - 1.2 mg/dL - 0.6 0.6  Alkaline Phos 38 - 126 U/L - 56 50  AST 15 - 41 U/L - 32 28  ALT 0 - 44 U/L - 20 19       Component Value Date/Time   RBC 3.31 (L) 10/08/2019 1120   MCV 103.3 (H) 10/08/2019 1120   MCH 32.9 10/08/2019 1120   MCHC 31.9 10/08/2019 1120   RDW 14.6 10/08/2019 1120   LYMPHSABS 1.8 10/08/2019 1120   MONOABS 0.4 10/08/2019 1120   EOSABS 0.3 10/08/2019 1120   BASOSABS 0.0 10/08/2019 1120   Lab Results  Component Value Date   TOTALPROTELP 8.7 (H) 08/27/2019   ALBUMINELP 3.7 08/27/2019   A1GS 0.3 08/27/2019   A2GS 0.5 08/27/2019   BETS 1.1 08/27/2019   GAMS 3.1 (H) 08/27/2019   MSPIKE 1.8 (H) 08/27/2019   SPEI Comment 08/27/2019   Lab Results  Component Value Date   KPAFRELGTCHN 17.9 08/27/2019   KPAFRELGTCHN 16.2 08/20/2019   KPAFRELGTCHN 21.6 (H) 06/27/2019   LAMBDASER 3.8 (L) 08/27/2019   LAMBDASER 3.6 (L) 08/20/2019   LAMBDASER 4.5 (L) 06/27/2019   KAPLAMBRATIO 4.71 (H) 08/27/2019   KAPLAMBRATIO 4.50 (H) 08/20/2019   KAPLAMBRATIO 7.56 06/28/2019    DIAGNOSTIC IMAGING:  I have reviewed scans.  ASSESSMENT: 1.  IgA kappa smoldering myeloma: -BMBX on 07/09/2019 shows hypercellular marrow for age with trilineage hematopoiesis.  Increased number of atypical plasma cells present 36% of all cell lines.  Plasma cells are kappa light chain restricted. -Unfortunately her specimen has reached cytogenetics and FISH lab week later due to bad weather and no viable plasma cells. -PET scan on 07/09/2019 showed no findings of active myeloma. -24-hour  urine shows total protein 59 mg.  Urine immunofixation shows IgA kappa monoclonal protein.  LDH is 184. -Based on [facility] criteria she has high risk with about 45-50% probability of progression to myeloma in the next 2 years.  Plan:  1.  IgA kappa smoldering myeloma: -We reviewed labs from 08/27/2019.  M spike is stable at 1.8.  Kappa light chains are 17.9, lambda light chains 3.8 with ratio 4.71.  Previous ratio was 4.5. -Hemoglobin was 10.9.  Creatinine is 0.9 and calcium is 9.3. -We will see her back in 2 months for follow-up with repeat labs. -We will consider imaging at that time.  2.  Macrocytic anemia: -Her last folic acid was low at 2.9.  She will continue folic acid 1 mg tablet daily.  I plan to repeat folic acid at next visit.  3.  Pulmonary embolism: -  She has PE in 2015 and is on Coumadin since then.  She has some easy bruising.    Orders placed this encounter:  No orders of the defined types were placed in this encounter.    Derek Jack, MD, 10/15/19 12:06 PM  [place] [phone]   I, Jacqualyn Posey, am acting as a scribe for Dr. Sanda Linger.  I, Derek Jack MD, have reviewed the above documentation for accuracy and completeness, and I agree with the above.

## 2019-10-15 NOTE — Patient Instructions (Signed)
[facility] at [facility] Discharge Instructions  You were seen today by Dr. Katragadda. He went over your recent results. He will see you back in 2 months for labs and follow up.   Thank you for choosing [facility] at [facility] to provide your oncology and hematology care.  To afford each patient quality time with our provider, please arrive at least 15 minutes before your scheduled appointment time.   If you have a lab appointment with the Cancer Center please come in thru the  Main Entrance and check in at the main information desk  You need to re-schedule your appointment should you arrive 10 or more minutes late.  We strive to give you quality time with our providers, and arriving late affects you and other patients whose appointments are after yours.  Also, if you no show three or more times for appointments you may be dismissed from the clinic at the providers discretion.     Again, thank you for choosing [facility].  Our hope is that these requests will decrease the amount of time that you wait before being seen by our physicians.       _____________________________________________________________  Should you have questions after your visit to  Cancer Center, please contact our office at [phone] between the hours of 8:00 a.m. and 4:30 p.m.  Voicemails left after 4:00 p.m. will not be returned until the following business day.  For prescription refill requests, have your pharmacy contact our office and allow 72 hours.    Cancer Center Support Programs:   > Cancer Support Group  2nd Tuesday of the month 1pm-2pm, Journey Room    

## 2019-11-20 ENCOUNTER — Other Ambulatory Visit (HOSPITAL_COMMUNITY): Payer: Self-pay | Admitting: Hematology

## 2019-11-20 NOTE — Telephone Encounter (Signed)
Per last office note, patient is to continue taking folic acid.  Refills sent to pharmacy.

## 2019-11-27 ENCOUNTER — Encounter: Payer: Self-pay | Admitting: Family Medicine

## 2019-11-27 ENCOUNTER — Ambulatory Visit (INDEPENDENT_AMBULATORY_CARE_PROVIDER_SITE_OTHER): Payer: Medicare Other | Admitting: Family Medicine

## 2019-11-27 ENCOUNTER — Other Ambulatory Visit: Payer: Self-pay

## 2019-11-27 VITALS — BP 124/76 | HR 79 | Temp 97.1°F | Resp 18 | Ht 64.0 in | Wt 176.0 lb

## 2019-11-27 DIAGNOSIS — R490 Dysphonia: Secondary | ICD-10-CM | POA: Insufficient documentation

## 2019-11-27 DIAGNOSIS — I10 Essential (primary) hypertension: Secondary | ICD-10-CM

## 2019-11-27 DIAGNOSIS — K219 Gastro-esophageal reflux disease without esophagitis: Secondary | ICD-10-CM | POA: Diagnosis not present

## 2019-11-27 NOTE — Assessment & Plan Note (Signed)
Courtney Rogers is encouraged to maintain a well balanced diet that is low in salt. Controlled, continue current medication regimen.  Additionally, she is also reminded that exercise is beneficial for heart health and control of  Blood pressure. 30-60 minutes daily is recommended-walking was suggested.

## 2019-11-27 NOTE — Assessment & Plan Note (Signed)
Overall symptoms controlled.  She is having hoarseness last 3-4 months. I have advised her to take her Protonix prior to meal and other medications.If not improved in a month now for rehab improvement ENT referral will be made, as unsure if GI related

## 2019-11-27 NOTE — Progress Notes (Signed)
Subjective:  Patient ID: Courtney Rogers, female    DOB: Sep 13, 1950  Age: 69 y.o. MRN: [id number]  CC:  Chief Complaint  Patient presents with  . 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  She has a history that includes MI in 2009, anxiety, CAD, hyperlipidemia, hypertension, sleep apnea, overactive bladder, PE.  Reports taking all her medications without any issue or concern.  Is on Coumadin does get bruising from this but otherwise no active bleeding.  She has had her Covid vaccines.  Needs to have her pneumonia 23 vaccine.  Overall up-to-date on all vaccines.  Wears her CPAP at night and reports not having any issues with this.  She reports taking all her medications without any issues  Reports sometimes she has trouble sleeping but overall she is able to sleep.  He does have a bridge she sees a Pharmacist, community regularly.  She has no problems chewing or swallowing.  Reports some hoarseness over the last 3 to 4 months.  Is on Protonix denies having any heartburn symptoms.  Does report some discomfort sometimes in the center of her chest it feels like a 35-year-old away and then it goes away.  She reports sometimes she has constipation but otherwise no changes in bowel or bladder habits.  Denies having any reports that her memory is not as good as it could be.  But overall is okay with these things.  Is able to drive herself.  She has had been having some increasing falls.  Right knee gives out on her she is planning to have surgery on August 24.  She denies have any skin issues outside of the bruising from the warfarin.  Reports that she has glasses  and sees an eye doctor regularly does not have any blurred vision at this time.  Last colonoscopy was in 2018.  She is followed by Dr. Gordy Levan.  Last mammogram was done in [place].  Reports it was May or June.  Today patient denies signs and symptoms of COVID 19 infection including fever, chills, cough, shortness of breath, and headache. Past Medical, Surgical, Social History, Allergies, and Medications have been Reviewed.   Past Medical History:  Diagnosis Date  . Acute myocardial infarction [facility]) 2009   CAD/no stent medically managed  . Anxiety disorder   . CAD (coronary artery disease)   . Hyperlipidemia   . Hypertension   . IBS (irritable bowel syndrome)   . Overactive bladder   . PE (pulmonary embolism) JUN 2014  . Presence of permanent cardiac pacemaker   . Sleep apnea     Current Meds  Medication Sig  . acetaminophen (TYLENOL) 500 MG tablet Take 1,000 mg by mouth 2 (two) times daily as needed for moderate pain or headache.  . albuterol (PROVENTIL HFA;VENTOLIN HFA) 108 (90 BASE) MCG/ACT inhaler Inhale 2 puffs into the lungs every 6 (six) hours as needed for shortness of breath.  Marland Kitchen amLODipine (NORVASC) 5 MG tablet Take 5 mg by mouth daily.  Marland Kitchen aspirin EC 81 MG tablet Take 81 mg  by mouth at bedtime.  Marland Kitchen b complex vitamins tablet Take 1 tablet by mouth daily.  . busPIRone (BUSPAR) 5 MG tablet Take 5 mg by mouth 2 (two) times daily.  . Calcium Carbonate-Vitamin D (CALCIUM 600+D PO) Take 1 tablet by mouth 2 (two) times daily.  . chlorthalidone (HYGROTON) 25 MG tablet Take 25 mg by mouth daily.   . Cyanocobalamin (B-12) 2500 MCG TABS Take 1 capsule by mouth daily.  . fluticasone (FLONASE) 50 MCG/ACT nasal spray SPRAY 2 SPRAYS INTO EACH NOSTRIL ONCE DAILY  . folic acid (FOLVITE) 1 MG tablet TAKE 1 TABLET BY MOUTH ONCE DAILY.  Marland Kitchen gabapentin (NEURONTIN) 600 MG tablet Take 600 mg by mouth 2 (two) times daily.   Marland Kitchen ipratropium-albuterol (DUONEB) 0.5-2.5 (3) MG/3ML SOLN Supposed to be doing  4 times daily,  She has been doing as needed  . lidocaine (XYLOCAINE) 2 % solution TAKE 2 TEASPOONFULS BEFORE MEALS AND AT BEDTIME AS NEEDED MAY REPEAT EVERY 4 HOURS. (MAX OF 8 DOSES PER DAY)  . losartan (COZAAR) 50 MG tablet Take 50 mg by mouth 2 (two) times daily.  . metoprolol succinate (TOPROL-XL) 50 MG 24 hr tablet Take 75 mg by mouth daily.   . montelukast (SINGULAIR) 10 MG tablet Take 10 mg by mouth daily.   . nitroGLYCERIN (NITROSTAT) 0.4 MG SL tablet Place 0.4 mg under the tongue every 5 (five) minutes as needed for chest pain.   . pantoprazole (PROTONIX) 40 MG tablet Take 1 tablet (40 mg total) by mouth 2 (two) times daily before a meal.  . potassium chloride (K-DUR,KLOR-CON) 10 MEQ tablet Take 40 mEq by mouth 2 (two) times daily.   . Probiotic Product (PROBIOTIC PO) Take 1 capsule by mouth daily. [employer] [facility].  . sertraline (ZOLOFT) 100 MG tablet Take 200 mg by mouth daily.   . Simethicone (GAS-X PO) Take 2 tablets by mouth daily as needed (gas).  . simvastatin (ZOCOR) 40 MG tablet Take 40 mg by mouth at bedtime.    . vitamin C (ASCORBIC ACID) 500 MG tablet Take 500 mg by mouth daily.  Marland Kitchen warfarin (COUMADIN) 5 MG tablet Take 5 mg by mouth at bedtime. Mon -Thurs   5 mg and Fri -Sun  2.5 mg    ROS:  Review of Systems  HENT: Negative.        Hoarseness   Eyes: Negative.   Respiratory: Negative.   Cardiovascular: Negative.   Gastrointestinal: Negative.   Genitourinary: Negative.   Musculoskeletal: Negative.   Skin: Negative.   Neurological: Negative.   Endo/Heme/Allergies: Negative.   Psychiatric/Behavioral: Negative.   All other systems reviewed and are negative.    Objective:   Today's Vitals: BP 124/76 (BP Location: Right Arm, Patient Position: Sitting, Cuff Size: Normal)   Pulse 79   Temp (!) 97.1 F (36.2 C) (Temporal)   Resp 18   Ht 5\' 4"  (1.626 m)   Wt 176 lb (79.8 kg)   SpO2 95%   BMI 30.21 kg/m  Vitals with BMI 11/27/2019 10/15/2019 09/18/2019  Height  5\' 4"  - 5\' 4"   Weight 176 lbs 177 lbs 2 oz 172 lbs 3 oz  BMI 48.5 - 46.27  Systolic 035 009 381  Diastolic 76 74 83  Pulse 79 79 84     Physical Exam Vitals and nursing note reviewed.  Constitutional:      Appearance: Normal appearance. She is well-developed and well-groomed. She is obese.  HENT:     Head: Normocephalic and atraumatic.  Right Ear: External ear normal.     Left Ear: External ear normal.     Mouth/Throat:     Comments: Mask in place  Eyes:     General:        Right eye: No discharge.        Left eye: No discharge.     Conjunctiva/sclera: Conjunctivae normal.  Cardiovascular:     Rate and Rhythm: Normal rate and regular rhythm.     Pulses: Normal pulses.     Heart sounds: Normal heart sounds.  Pulmonary:     Effort: Pulmonary effort is normal.     Breath sounds: Normal breath sounds.  Musculoskeletal:        General: Normal range of motion.     Cervical back: Normal range of motion and neck supple.  Skin:    General: Skin is warm.  Neurological:     General: No focal deficit present.     Mental Status: She is alert and oriented to person, place, and time.  Psychiatric:        Attention and Perception: Attention normal.        Mood and Affect: Mood normal.        Speech: Speech normal.        Behavior: Behavior normal. Behavior is cooperative.        Thought Content: Thought content normal.        Cognition and Memory: Cognition normal.        Judgment: Judgment normal.      Assessment   1. Essential hypertension   2. Gastroesophageal reflux disease without esophagitis   3. Hoarseness of voice     Tests ordered No orders of the defined types were placed in this encounter.    Plan: Please see assessment and plan per problem list above.   No orders of the defined types were placed in this encounter.   Patient to follow-up in 01/02/2020   Perlie Mayo, NP

## 2019-11-27 NOTE — Patient Instructions (Signed)
I appreciate the opportunity to provide you with care for your health and wellness. Today we discussed: establish care  Follow up: 1.5 months for hoarseness   No labs or referrals today  GREAT TO MEET YOU TODAY!   Please take your Protonix twice daily on an empty stomach 1 hour before other medications or food.  Please continue to practice social distancing to keep you, your family, and our community safe.  If you must go out, please wear a mask and practice good handwashing.  It was a pleasure to see you and I look forward to continuing to work together on your health and well-being. Please do not hesitate to call the office if you need care or have questions about your care.  Have a wonderful day and week. With Gratitude, Cherly Beach, DNP, AGNP-BC

## 2019-11-27 NOTE — Assessment & Plan Note (Addendum)
Overall symptoms controlled.  Follow-up with GI in a few months. she is having hoarseness last 3-4 months. I have advised her to take her Protonix prior to meal and other medications. If not improved in a month now for rehab improvement ENT referral will be made, as unsure if GI related

## 2019-12-18 ENCOUNTER — Inpatient Hospital Stay (HOSPITAL_COMMUNITY): Payer: Medicare Other | Attending: Hematology

## 2019-12-25 ENCOUNTER — Inpatient Hospital Stay (HOSPITAL_COMMUNITY): Payer: Medicare Other | Attending: Hematology

## 2019-12-25 ENCOUNTER — Other Ambulatory Visit: Payer: Self-pay

## 2019-12-25 ENCOUNTER — Ambulatory Visit (HOSPITAL_COMMUNITY): Payer: Medicare Other | Admitting: Nurse Practitioner

## 2019-12-25 DIAGNOSIS — D509 Iron deficiency anemia, unspecified: Secondary | ICD-10-CM | POA: Insufficient documentation

## 2019-12-25 DIAGNOSIS — D472 Monoclonal gammopathy: Secondary | ICD-10-CM | POA: Insufficient documentation

## 2019-12-25 LAB — CBC WITH DIFFERENTIAL/PLATELET
Abs Immature Granulocytes: 0.02 10*3/uL (ref 0.00–0.07)
Basophils Absolute: 0 10*3/uL (ref 0.0–0.1)
Basophils Relative: 1 %
Eosinophils Absolute: 0.2 10*3/uL (ref 0.0–0.5)
Eosinophils Relative: 4 %
HCT: 32.9 % — ABNORMAL LOW (ref 36.0–46.0)
Hemoglobin: 10.6 g/dL — ABNORMAL LOW (ref 12.0–15.0)
Immature Granulocytes: 0 %
Lymphocytes Relative: 24 %
Lymphs Abs: 1.3 10*3/uL (ref 0.7–4.0)
MCH: 32.5 pg (ref 26.0–34.0)
MCHC: 32.2 g/dL (ref 30.0–36.0)
MCV: 100.9 fL — ABNORMAL HIGH (ref 80.0–100.0)
Monocytes Absolute: 0.5 10*3/uL (ref 0.1–1.0)
Monocytes Relative: 10 %
Neutro Abs: 3.3 10*3/uL (ref 1.7–7.7)
Neutrophils Relative %: 61 %
Platelets: 113 10*3/uL — ABNORMAL LOW (ref 150–400)
RBC: 3.26 MIL/uL — ABNORMAL LOW (ref 3.87–5.11)
RDW: 14.1 % (ref 11.5–15.5)
WBC: 5.4 10*3/uL (ref 4.0–10.5)
nRBC: 0 % (ref 0.0–0.2)

## 2019-12-25 LAB — COMPREHENSIVE METABOLIC PANEL
ALT: 16 U/L (ref 0–44)
AST: 26 U/L (ref 15–41)
Albumin: 3.8 g/dL (ref 3.5–5.0)
Alkaline Phosphatase: 66 U/L (ref 38–126)
Anion gap: 14 (ref 5–15)
BUN: 15 mg/dL (ref 8–23)
CO2: 26 mmol/L (ref 22–32)
Calcium: 9.3 mg/dL (ref 8.9–10.3)
Chloride: 99 mmol/L (ref 98–111)
Creatinine, Ser: 0.86 mg/dL (ref 0.44–1.00)
GFR calc Af Amer: >60 mL/min (ref >60)
GFR calc non Af Amer: >60 mL/min (ref >60)
Glucose, Bld: 101 mg/dL — ABNORMAL HIGH (ref 70–99)
Potassium: 3.2 mmol/L — ABNORMAL LOW (ref 3.5–5.1)
Sodium: 139 mmol/L (ref 135–145)
Total Bilirubin: 0.6 mg/dL (ref 0.3–1.2)
Total Protein: 9.6 g/dL — ABNORMAL HIGH (ref 6.5–8.1)

## 2019-12-25 LAB — LACTATE DEHYDROGENASE: LDH: 151 U/L (ref 98–192)

## 2019-12-25 LAB — IRON AND TIBC
Iron: 59 ug/dL (ref 28–170)
Saturation Ratios: 21 % (ref 10.4–31.8)
TIBC: 285 ug/dL (ref 250–450)
UIBC: 226 ug/dL

## 2019-12-25 LAB — VITAMIN B12: Vitamin B-12: 926 pg/mL — ABNORMAL HIGH (ref 180–914)

## 2019-12-25 LAB — FERRITIN: Ferritin: 413 ng/mL — ABNORMAL HIGH (ref 11–307)

## 2019-12-26 ENCOUNTER — Other Ambulatory Visit: Payer: Self-pay | Admitting: Family Medicine

## 2019-12-26 DIAGNOSIS — R413 Other amnesia: Secondary | ICD-10-CM

## 2019-12-26 LAB — KAPPA/LAMBDA LIGHT CHAINS
Kappa free light chain: 21.8 mg/L — ABNORMAL HIGH (ref 3.3–19.4)
Kappa, lambda light chain ratio: 3.46 — ABNORMAL HIGH (ref 0.26–1.65)
Lambda free light chains: 6.3 mg/L (ref 5.7–26.3)

## 2019-12-26 LAB — PROTEIN ELECTROPHORESIS, SERUM
A/G Ratio: 0.7 (ref 0.7–1.7)
Albumin ELP: 3.8 g/dL (ref 2.9–4.4)
Alpha-1-Globulin: 0.3 g/dL (ref 0.0–0.4)
Alpha-2-Globulin: 0.5 g/dL (ref 0.4–1.0)
Beta Globulin: 1.1 g/dL (ref 0.7–1.3)
Gamma Globulin: 3.5 g/dL — ABNORMAL HIGH (ref 0.4–1.8)
Globulin, Total: 5.4 g/dL — ABNORMAL HIGH (ref 2.2–3.9)
M-Spike, %: 2.5 g/dL — ABNORMAL HIGH
Total Protein ELP: 9.2 g/dL — ABNORMAL HIGH (ref 6.0–8.5)

## 2020-01-02 ENCOUNTER — Other Ambulatory Visit: Payer: Self-pay

## 2020-01-02 ENCOUNTER — Encounter: Payer: Self-pay | Admitting: Family Medicine

## 2020-01-02 ENCOUNTER — Ambulatory Visit (INDEPENDENT_AMBULATORY_CARE_PROVIDER_SITE_OTHER): Payer: Medicare Other | Admitting: Family Medicine

## 2020-01-02 VITALS — BP 118/68 | HR 94 | Temp 97.8°F | Resp 18 | Ht 64.0 in | Wt 170.8 lb

## 2020-01-02 DIAGNOSIS — R490 Dysphonia: Secondary | ICD-10-CM | POA: Diagnosis not present

## 2020-01-02 DIAGNOSIS — M5030 Other cervical disc degeneration, unspecified cervical region: Secondary | ICD-10-CM | POA: Insufficient documentation

## 2020-01-02 NOTE — Patient Instructions (Signed)
I appreciate the opportunity to provide you with care for your health and wellness. Today we discussed: Ongoing hoarseness  Follow up: 3 months   No labs  Referrals today: ENT   Please continue to take your Protonix as we have discussed.   Twice daily, on empty stomach 1 hour before any eating.  I do hope that your knee surgery goes well for you.  Please continue to practice social distancing to keep you, your family, and our community safe.  If you must go out, please wear a mask and practice good handwashing.  It was a pleasure to see you and I look forward to continuing to work together on your health and well-being. Please do not hesitate to call the office if you need care or have questions about your care.  Have a wonderful day and week. With Gratitude, Cherly Beach, DNP, AGNP-BC

## 2020-01-02 NOTE — Assessment & Plan Note (Signed)
Overall symptoms are questionable she has had hoarseness over the last 3 to 4 months even with the increase in Protonix and the designated time of taking it she reported she did not have any improvement.  ENT referral made today.  As I am not sure if it is GI related.

## 2020-01-02 NOTE — Progress Notes (Signed)
Subjective:  Patient ID: Courtney Rogers, female    DOB: May 16, 1951  Age: 69 y.o. MRN: [id number]  CC:  Chief Complaint  Patient presents with  . Follow-up    1.5 month follow up with hoarseness. the hoarseness is about the same nothing has changed      HPI  HPI Courtney Rogers is a 69 year old female patient who presents today for follow-up on hoarseness.  I saw her back on July 7 at that time I increased her Protonix to be taken to see if this was acid reflux related.  She reports that it has not gotten much better.  Is open to have a referral to ENT.  She denies having any changes outside of decreased appetite secondary to a lot of stress with dealing with taking care of her husband and having upcoming knee surgery.  Today patient denies signs and symptoms of COVID 19 infection including fever, chills, cough, shortness of breath, and headache. Past Medical, Surgical, Social History, Allergies, and Medications have been Reviewed.   Past Medical History:  Diagnosis Date  . Acute myocardial infarction [facility]) 2009   CAD/no stent medically managed  . Anxiety disorder   . CAD (coronary artery disease)   . Hyperlipidemia   . Hypertension   . IBS (irritable bowel syndrome)   . Overactive bladder   . PE (pulmonary embolism) JUN 2014  . Presence of permanent cardiac pacemaker   . Sleep apnea     Current Meds  Medication Sig  . acetaminophen (TYLENOL) 500 MG tablet Take 1,000 mg by mouth 2 (two) times daily as needed for moderate pain or headache.  . albuterol (PROVENTIL HFA;VENTOLIN HFA) 108 (90 BASE) MCG/ACT inhaler Inhale 2 puffs into the lungs every 6 (six) hours as needed for shortness of breath.  Marland Kitchen amLODipine (NORVASC) 5 MG tablet Take 5 mg by mouth daily.  Marland Kitchen aspirin EC 81 MG tablet Take 81 mg by mouth at bedtime.  Marland Kitchen b complex vitamins tablet Take 1 tablet by mouth daily.  . busPIRone (BUSPAR) 5 MG tablet Take 5 mg by mouth 2 (two) times daily.  . Calcium Carbonate-Vitamin  D (CALCIUM 600+D PO) Take 1 tablet by mouth 2 (two) times daily.  . chlorthalidone (HYGROTON) 25 MG tablet Take 25 mg by mouth daily.   . Cyanocobalamin (B-12) 2500 MCG TABS Take 1 capsule by mouth daily.  . fluticasone (FLONASE) 50 MCG/ACT nasal spray SPRAY 2 SPRAYS INTO EACH NOSTRIL ONCE DAILY  . folic acid (FOLVITE) 1 MG tablet TAKE 1 TABLET BY MOUTH ONCE DAILY.  Marland Kitchen gabapentin (NEURONTIN) 600 MG tablet Take 600 mg by mouth 2 (two) times daily.   Marland Kitchen ipratropium-albuterol (DUONEB) 0.5-2.5 (3) MG/3ML SOLN Supposed to be doing 4 times daily,  She has been doing as needed  . lidocaine (XYLOCAINE) 2 % solution TAKE 2 TEASPOONFULS BEFORE MEALS AND AT BEDTIME AS NEEDED MAY REPEAT EVERY 4 HOURS. (MAX OF 8 DOSES PER DAY)  . losartan (COZAAR) 50 MG tablet Take 50 mg by mouth 2 (two) times daily.  . metoprolol succinate (TOPROL-XL) 50 MG 24 hr tablet Take 75 mg by mouth daily.   . montelukast (SINGULAIR) 10 MG tablet Take 10 mg by mouth daily.   . nitroGLYCERIN (NITROSTAT) 0.4 MG SL tablet Place 0.4 mg under the tongue every 5 (five) minutes as needed for chest pain.   . pantoprazole (PROTONIX) 40 MG tablet Take 1 tablet (40 mg total) by mouth 2 (two) times daily before a  meal.  . potassium chloride (K-DUR,KLOR-CON) 10 MEQ tablet Take 40 mEq by mouth 2 (two) times daily.   . Probiotic Product (PROBIOTIC PO) Take 1 capsule by mouth daily. [employer].  . sertraline (ZOLOFT) 100 MG tablet Take 200 mg by mouth daily.   . Simethicone (GAS-X PO) Take 2 tablets by mouth daily as needed (gas).  . simvastatin (ZOCOR) 40 MG tablet Take 40 mg by mouth at bedtime.    . triamcinolone cream (KENALOG) 0.1 % Apply 1 application topically as needed.   . vitamin C (ASCORBIC ACID) 500 MG tablet Take 500 mg by mouth daily.  Marland Kitchen warfarin (COUMADIN) 5 MG tablet Take 5 mg by mouth at bedtime. Mon -Thurs   5 mg and Fri -Sun  2.5 mg    ROS:  Review of Systems  Constitutional: Negative.        Decreased appetite   HENT: Negative.        Hoarseness  Eyes: Negative.   Respiratory: Negative.   Cardiovascular: Negative.   Gastrointestinal: Negative.   Genitourinary: Negative.   Musculoskeletal: Negative.   Skin: Negative.   Neurological: Negative.   Endo/Heme/Allergies: Negative.   Psychiatric/Behavioral: Negative.   All other systems reviewed and are negative.    Objective:   Today's Vitals: BP 118/68 (BP Location: Left Arm, Patient Position: Sitting, Cuff Size: Normal)   Pulse 94   Temp 97.8 F (36.6 C) (Temporal)   Resp 18   Ht 5\' 4"  (1.626 m)   Wt 170 lb 12.8 oz (77.5 kg)   SpO2 96%   BMI 29.32 kg/m  Vitals with BMI 01/02/2020 11/27/2019 10/15/2019  Height 5\' 4"  5\' 4"  -  Weight 170 lbs 13 oz 176 lbs 177 lbs 2 oz  BMI 84.6 96.2 -  Systolic 952 841 324  Diastolic 68 76 74  Pulse 94 79 79     Physical Exam Vitals and nursing note reviewed.  Constitutional:      Appearance: Normal appearance. She is well-developed, well-groomed and overweight.  HENT:     Head: Normocephalic and atraumatic.     Right Ear: External ear normal.     Left Ear: External ear normal.     Mouth/Throat:     Comments: Mask in place  Eyes:     General:        Right eye: No discharge.        Left eye: No discharge.     Conjunctiva/sclera: Conjunctivae normal.  Cardiovascular:     Rate and Rhythm: Normal rate and regular rhythm.     Pulses: Normal pulses.     Heart sounds: Normal heart sounds.  Pulmonary:     Effort: Pulmonary effort is normal.     Breath sounds: Normal breath sounds.  Musculoskeletal:        General: Normal range of motion.     Cervical back: Normal range of motion and neck supple.  Skin:    General: Skin is warm.  Neurological:     General: No focal deficit present.     Mental Status: She is alert and oriented to person, place, and time.  Psychiatric:        Attention and Perception: Attention normal.        Mood and Affect: Mood normal.        Speech: Speech normal.         Behavior: Behavior normal. Behavior is cooperative.        Thought Content: Thought content normal.  Cognition and Memory: Cognition normal.        Judgment: Judgment normal.          Assessment   1. Hoarseness of voice     Tests ordered Orders Placed This Encounter  Procedures  . Ambulatory referral to ENT     Plan: Please see assessment and plan per problem list above.   No orders of the defined types were placed in this encounter.   Patient to follow-up in 3 months   Perlie Mayo, NP

## 2020-01-03 ENCOUNTER — Ambulatory Visit
Admission: RE | Admit: 2020-01-03 | Discharge: 2020-01-03 | Disposition: A | Discharge status: Home / Self Care | Payer: Medicare Other | Source: Ambulatory Visit | Attending: Family Medicine | Admitting: Family Medicine

## 2020-01-03 ENCOUNTER — Other Ambulatory Visit: Payer: Self-pay | Admitting: Family Medicine

## 2020-01-03 DIAGNOSIS — R269 Unspecified abnormalities of gait and mobility: Secondary | ICD-10-CM

## 2020-01-03 DIAGNOSIS — R413 Other amnesia: Secondary | ICD-10-CM

## 2020-01-07 ENCOUNTER — Emergency Department (HOSPITAL_COMMUNITY)
Admission: EM | Admit: 2020-01-07 | Discharge: 2020-01-07 | Disposition: A | Discharge status: Home / Self Care | Payer: Medicare Other | Attending: Emergency Medicine | Admitting: Emergency Medicine

## 2020-01-07 ENCOUNTER — Encounter (HOSPITAL_COMMUNITY): Payer: Self-pay | Admitting: Emergency Medicine

## 2020-01-07 ENCOUNTER — Emergency Department (HOSPITAL_COMMUNITY): Payer: Medicare Other

## 2020-01-07 ENCOUNTER — Other Ambulatory Visit: Payer: Self-pay

## 2020-01-07 DIAGNOSIS — I252 Old myocardial infarction: Secondary | ICD-10-CM | POA: Diagnosis not present

## 2020-01-07 DIAGNOSIS — Y9389 Activity, other specified: Secondary | ICD-10-CM | POA: Insufficient documentation

## 2020-01-07 DIAGNOSIS — S32001A Stable burst fracture of unspecified lumbar vertebra, initial encounter for closed fracture: Secondary | ICD-10-CM | POA: Diagnosis not present

## 2020-01-07 DIAGNOSIS — Y999 Unspecified external cause status: Secondary | ICD-10-CM | POA: Diagnosis not present

## 2020-01-07 DIAGNOSIS — Z9181 History of falling: Secondary | ICD-10-CM | POA: Diagnosis not present

## 2020-01-07 DIAGNOSIS — Z79899 Other long term (current) drug therapy: Secondary | ICD-10-CM | POA: Diagnosis not present

## 2020-01-07 DIAGNOSIS — Y92002 Bathroom of unspecified non-institutional (private) residence single-family (private) house as the place of occurrence of the external cause: Secondary | ICD-10-CM | POA: Insufficient documentation

## 2020-01-07 DIAGNOSIS — Z950 Presence of cardiac pacemaker: Secondary | ICD-10-CM | POA: Insufficient documentation

## 2020-01-07 DIAGNOSIS — X509XXA Other and unspecified overexertion or strenuous movements or postures, initial encounter: Secondary | ICD-10-CM | POA: Insufficient documentation

## 2020-01-07 DIAGNOSIS — S3992XA Unspecified injury of lower back, initial encounter: Secondary | ICD-10-CM | POA: Diagnosis present

## 2020-01-07 DIAGNOSIS — M545 Low back pain, unspecified: Secondary | ICD-10-CM

## 2020-01-07 DIAGNOSIS — I10 Essential (primary) hypertension: Secondary | ICD-10-CM | POA: Diagnosis not present

## 2020-01-07 DIAGNOSIS — Z7982 Long term (current) use of aspirin: Secondary | ICD-10-CM | POA: Insufficient documentation

## 2020-01-07 DIAGNOSIS — Z7901 Long term (current) use of anticoagulants: Secondary | ICD-10-CM | POA: Insufficient documentation

## 2020-01-07 LAB — CBC WITH DIFFERENTIAL/PLATELET
Abs Immature Granulocytes: 0.06 10*3/uL (ref 0.00–0.07)
Basophils Absolute: 0 10*3/uL (ref 0.0–0.1)
Basophils Relative: 1 %
Eosinophils Absolute: 0.2 10*3/uL (ref 0.0–0.5)
Eosinophils Relative: 2 %
HCT: 34.3 % — ABNORMAL LOW (ref 36.0–46.0)
Hemoglobin: 10.9 g/dL — ABNORMAL LOW (ref 12.0–15.0)
Immature Granulocytes: 1 %
Lymphocytes Relative: 21 %
Lymphs Abs: 1.7 10*3/uL (ref 0.7–4.0)
MCH: 32 pg (ref 26.0–34.0)
MCHC: 31.8 g/dL (ref 30.0–36.0)
MCV: 100.6 fL — ABNORMAL HIGH (ref 80.0–100.0)
Monocytes Absolute: 0.7 10*3/uL (ref 0.1–1.0)
Monocytes Relative: 8 %
Neutro Abs: 5.5 10*3/uL (ref 1.7–7.7)
Neutrophils Relative %: 67 %
Platelets: 105 10*3/uL — ABNORMAL LOW (ref 150–400)
RBC: 3.41 MIL/uL — ABNORMAL LOW (ref 3.87–5.11)
RDW: 14.3 % (ref 11.5–15.5)
WBC: 8.2 10*3/uL (ref 4.0–10.5)
nRBC: 0 % (ref 0.0–0.2)

## 2020-01-07 LAB — COMPREHENSIVE METABOLIC PANEL
ALT: 18 U/L (ref 0–44)
AST: 30 U/L (ref 15–41)
Albumin: 3.6 g/dL (ref 3.5–5.0)
Alkaline Phosphatase: 56 U/L (ref 38–126)
Anion gap: 13 (ref 5–15)
BUN: 19 mg/dL (ref 8–23)
CO2: 22 mmol/L (ref 22–32)
Calcium: 9.4 mg/dL (ref 8.9–10.3)
Chloride: 103 mmol/L (ref 98–111)
Creatinine, Ser: 0.95 mg/dL (ref 0.44–1.00)
GFR calc Af Amer: >60 mL/min (ref >60)
GFR calc non Af Amer: >60 mL/min (ref >60)
Glucose, Bld: 101 mg/dL — ABNORMAL HIGH (ref 70–99)
Potassium: 3.6 mmol/L (ref 3.5–5.1)
Sodium: 138 mmol/L (ref 135–145)
Total Bilirubin: 0.8 mg/dL (ref 0.3–1.2)
Total Protein: 9.8 g/dL — ABNORMAL HIGH (ref 6.5–8.1)

## 2020-01-07 LAB — LIPASE, BLOOD: Lipase: 54 U/L — ABNORMAL HIGH (ref 11–51)

## 2020-01-07 LAB — URINALYSIS, ROUTINE W REFLEX MICROSCOPIC
Bilirubin Urine: NEGATIVE
Glucose, UA: NEGATIVE mg/dL
Hgb urine dipstick: NEGATIVE
Ketones, ur: 5 mg/dL — AB
Leukocytes,Ua: NEGATIVE
Nitrite: NEGATIVE
Protein, ur: NEGATIVE mg/dL
Specific Gravity, Urine: >1.046 — ABNORMAL HIGH (ref 1.005–1.030)
pH: 5 (ref 5.0–8.0)

## 2020-01-07 LAB — PROTIME-INR
INR: 1.6 — ABNORMAL HIGH (ref 0.8–1.2)
Prothrombin Time: 18.2 seconds — ABNORMAL HIGH (ref 11.4–15.2)

## 2020-01-07 MED ORDER — NAPROXEN 250 MG PO TABS
500.0000 mg | ORAL_TABLET | Freq: Once | ORAL | Status: AC
  Administered 2020-01-07: 500 mg via ORAL
  Filled 2020-01-07: qty 2

## 2020-01-07 MED ORDER — METHOCARBAMOL 500 MG PO TABS
500.0000 mg | ORAL_TABLET | Freq: Once | ORAL | Status: AC
  Administered 2020-01-07: 500 mg via ORAL
  Filled 2020-01-07: qty 1

## 2020-01-07 MED ORDER — HYDROCODONE-ACETAMINOPHEN 5-325 MG PO TABS: 1.0000 | ORAL_TABLET | ORAL | 0 refills | Status: DC | PRN

## 2020-01-07 MED ORDER — IOHEXOL 300 MG/ML  SOLN
100.0000 mL | Freq: Once | INTRAMUSCULAR | Status: AC | PRN
  Administered 2020-01-07: 100 mL via INTRAVENOUS

## 2020-01-07 NOTE — ED Notes (Signed)
Rep. Here with TLSO Brace that was ordered earlier.

## 2020-01-07 NOTE — Discharge Instructions (Addendum)
Your testing shows a fracture in your lumbar spine.  Wear the brace as prescribed and follow-up with the neurosurgeon.  Take the pain medication as prescribed. Your INR is low today at 1.6. Let your knee surgeon know about your back fracture. Return to the ED if you develop worsening pain, weakness, numbness, tingling, fever, bowel or bladder incontinence or any concerns.

## 2020-01-07 NOTE — ED Provider Notes (Signed)
[facility] Provider Note   CSN: [id number] Arrival date & time: 01/07/20  9449     History Chief Complaint  Patient presents with  . Back Pain    Courtney Rogers is a 69 y.o. female.  Patient with history of hypertension, IBS, pulmonary embolism on Coumadin presenting with low back pain for the past 3 days.  States it is mostly on the left side radiates across her entire low back.  Started while she was cleaning the bathroom.  She did have a fall 2 weeks ago where she fell backwards onto her back but it was not hurting at that time.  No falls since.  Denies hitting her head with the fall.  No neck pain.  No chest pain or shortness of breath.  Has ongoing low back pain not responding to Tylenol.  There is no radiation of the pain down her leg.  No focal weakness, numbness or tingling.  No bowel or bladder incontinence.  No fever or vomiting.  She was constipated last week and took some MiraLAX at home but that did not improve her back pain.  She still having discomfort across her low back.  No previous back surgeries.  No history of cancer or IV drug abuse. She does have intermittent abdominal discomfort as well that comes and goes.  She is uncertain if this is related to her back pain or not.  The history is provided by the patient.  Back Pain Associated symptoms: abdominal pain   Associated symptoms: no dysuria, no fever, no headaches and no weakness        Past Medical History:  Diagnosis Date  . Acute myocardial infarction [facility]) 2009   CAD/no stent medically managed  . Anxiety disorder   . CAD (coronary artery disease)   . Hyperlipidemia   . Hypertension   . IBS (irritable bowel syndrome)   . Overactive bladder   . PE (pulmonary embolism) JUN 2014  . Presence of permanent cardiac pacemaker   . Sleep apnea     Patient Active Problem List   Diagnosis Date Noted  . DDD (degenerative disc disease), cervical 01/02/2020  . Hoarseness of voice  11/27/2019  . 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  . Essential hypertension 12/14/2017  . Iron deficiency anemia 08/03/2017  . Dyspepsia   . Back pain 04/19/2017  . Normocytic anemia 01/12/2017  . Fatty liver 11/03/2015  . Anxiety state 11/03/2015  . Weight loss 04/05/2011  . GERD 06/01/2010  . IRRITABLE BOWEL SYNDROME 07/21/2009  . Non-ulcer dyspepsia 04/30/2009    Past Surgical History:  Procedure Laterality Date  . ABDOMINAL HYSTERECTOMY    . BSO secondary to cyst    . CARDIAC CATHETERIZATION    . COLONOSCOPY  12/2009   Dr. West Carbo, propofol, normal. Next TCS 12/2019  . COLONOSCOPY N/A 05/22/2017   Procedure: COLONOSCOPY;  Surgeon: Danie Binder, MD;  Location: [facility];  Service: Endoscopy;  Laterality: N/A;  10:30am  . ESOPHAGOGASTRODUODENOSCOPY  05/11/09   schatzki ring/small hiatal hernia/path:gastritis  . ESOPHAGOGASTRODUODENOSCOPY N/A 05/22/2017   Procedure: ESOPHAGOGASTRODUODENOSCOPY (EGD);  Surgeon: Danie Binder, MD;  Location: [facility];  Service: Endoscopy;  Laterality: N/A;  . ESOPHAGOGASTRODUODENOSCOPY (EGD) WITH ESOPHAGEAL DILATION N/A 04/01/2013   QPR:[id number] at the gastroesophageal juction/multiple small polyps/mild gastritis  . GIVENS CAPSULE STUDY N/A 06/07/2017   Procedure: GIVENS CAPSULE STUDY;  Surgeon: Danie Binder, MD;  Location: [facility];  Service: Endoscopy;  Laterality: N/A;  7:30am  .  INSERT / REPLACE / REMOVE PACEMAKER     Last year per pt.(can't remember date)     OB History    Gravida  3   Para  3   Term  3   Preterm      AB      Living        SAB      TAB      Ectopic      Multiple      Live Births              Family History  Problem Relation Age of Onset  . Colon polyps Neg Hx   . Colon cancer Neg Hx     Social History   Tobacco Use  . Smoking status: Never Smoker  . Smokeless tobacco: Never Used  . Tobacco comment: Never smoked   Vaping Use  . Vaping Use: Never used  Substance Use  Topics  . Alcohol use: No    Alcohol/week: 0.0 standard drinks  . Drug use: No    Home Medications Prior to Admission medications   Medication Sig Start Date End Date Taking? Authorizing Provider  acetaminophen (TYLENOL) 500 MG tablet Take 1,000 mg by mouth 2 (two) times daily as needed for moderate pain or headache.    [provider]  albuterol (PROVENTIL HFA;VENTOLIN HFA) 108 (90 BASE) MCG/ACT inhaler Inhale 2 puffs into the lungs every 6 (six) hours as needed for shortness of breath.    [provider]  amLODipine (NORVASC) 5 MG tablet Take 5 mg by mouth daily. 06/18/19   [provider]  aspirin EC 81 MG tablet Take 81 mg by mouth at bedtime.    [provider]  b complex vitamins tablet Take 1 tablet by mouth daily.    [provider]  busPIRone (BUSPAR) 5 MG tablet Take 5 mg by mouth 2 (two) times daily. 11/25/16   [provider]  Calcium Carbonate-Vitamin D (CALCIUM 600+D PO) Take 1 tablet by mouth 2 (two) times daily.    [provider]  chlorthalidone (HYGROTON) 25 MG tablet Take 25 mg by mouth daily.     [provider]  Cyanocobalamin (B-12) 2500 MCG TABS Take 1 capsule by mouth daily.    [provider]  fluticasone (FLONASE) 50 MCG/ACT nasal spray SPRAY 2 SPRAYS INTO EACH NOSTRIL ONCE DAILY 01/19/18   Caren Macadam, MD  folic acid (FOLVITE) 1 MG tablet TAKE 1 TABLET BY MOUTH ONCE DAILY. 11/20/19   Lockamy, Randi L, NP-C  gabapentin (NEURONTIN) 600 MG tablet Take 600 mg by mouth 2 (two) times daily.  02/14/19   [provider]  ipratropium-albuterol (DUONEB) 0.5-2.5 (3) MG/3ML SOLN Supposed to be doing 4 times daily,  She has been doing as needed    [provider]  lidocaine (XYLOCAINE) 2 % solution TAKE 2 TEASPOONFULS BEFORE MEALS AND AT BEDTIME AS NEEDED MAY REPEAT EVERY 4 HOURS. (MAX OF 8 DOSES PER DAY) 08/27/19   Annitta Needs, NP  losartan (COZAAR) 50 MG tablet Take 50 mg by mouth 2  (two) times daily. 07/18/19   [provider]  metoprolol succinate (TOPROL-XL) 50 MG 24 hr tablet Take 75 mg by mouth daily.  11/25/16   [provider]  montelukast (SINGULAIR) 10 MG tablet Take 10 mg by mouth daily.     [provider]  nitroGLYCERIN (NITROSTAT) 0.4 MG SL tablet Place 0.4 mg under the tongue every 5 (five) minutes as  needed for chest pain.     [provider]  pantoprazole (PROTONIX) 40 MG tablet Take 1 tablet (40 mg total) by mouth 2 (two) times daily before a meal. 03/07/18   Fields, Sandi L, MD  potassium chloride (K-DUR,KLOR-CON) 10 MEQ tablet Take 40 mEq by mouth 2 (two) times daily.     [provider]  Probiotic Product (PROBIOTIC PO) Take 1 capsule by mouth daily. [facility].    [provider]  sertraline (ZOLOFT) 100 MG tablet Take 200 mg by mouth daily.     [provider]  Simethicone (GAS-X PO) Take 2 tablets by mouth daily as needed (gas).    [provider]  simvastatin (ZOCOR) 40 MG tablet Take 40 mg by mouth at bedtime.      [provider]  triamcinolone cream (KENALOG) 0.1 % Apply 1 application topically as needed.  12/31/18   [provider]  vitamin C (ASCORBIC ACID) 500 MG tablet Take 500 mg by mouth daily.    [provider]  warfarin (COUMADIN) 5 MG tablet Take 5 mg by mouth at bedtime. Mon -Thurs   5 mg and Fri -Sun  2.5 mg    [provider]    Allergies    Biaxin [clarithromycin] and Lisinopril  Review of Systems   Review of Systems  Constitutional: Positive for activity change. Negative for appetite change and fever.  HENT: Negative for congestion and rhinorrhea.   Eyes: Negative for visual disturbance.  Respiratory: Negative for cough, chest tightness and shortness of breath.   Gastrointestinal: Positive for abdominal pain.  Genitourinary: Negative for dysuria and hematuria.  Musculoskeletal: Positive for back pain.  Skin:  Negative for rash and wound.  Neurological: Negative for dizziness, weakness and headaches.   all other systems are negative except as noted in the HPI and PMH.    Physical Exam Updated Vital Signs BP 133/82   Pulse (!) 110   Temp 98.4 F (36.9 C)   Resp 18   Ht 5\' 4"  (1.626 m)   Wt 77.1 kg   SpO2 99%   BMI 29.18 kg/m   Physical Exam Vitals and nursing note reviewed.  Constitutional:      General: She is not in acute distress.    Appearance: She is well-developed.  HENT:     Head: Normocephalic and atraumatic.     Mouth/Throat:     Pharynx: No oropharyngeal exudate.  Eyes:     Conjunctiva/sclera: Conjunctivae normal.     Pupils: Pupils are equal, round, and reactive to light.  Neck:     Comments: No meningismus. Cardiovascular:     Rate and Rhythm: Normal rate and regular rhythm.     Heart sounds: Normal heart sounds. No murmur heard.   Pulmonary:     Effort: Pulmonary effort is normal. No respiratory distress.     Breath sounds: Normal breath sounds.  Abdominal:     Palpations: Abdomen is soft.     Tenderness: There is abdominal tenderness. There is no guarding or rebound.     Comments: Mild diffuse tenderness. No guarding or rebound  Musculoskeletal:        General: Tenderness present. Normal range of motion.     Cervical back: Normal range of motion and neck supple.     Comments: Paraspinal lumbar tenderness left greater than right.  No midline tenderness  5/5 strength in bilateral lower extremities. Ankle plantar and dorsiflexion intact. Great toe extension intact bilaterally. +2 DP  and PT pulses.  Reflexes not attempted due to her chronic knee pain. Antalgic gait.   Skin:    General: Skin is warm.  Neurological:     General: No focal deficit present.     Mental Status: She is alert and oriented to person, place, and time. Mental status is at baseline.     Cranial Nerves: No cranial nerve deficit.     Motor: No abnormal muscle tone.     Coordination:  Coordination normal.     Comments:  5/5 strength throughout. CN 2-12 intact.Equal grip strength.   Psychiatric:        Behavior: Behavior normal.     ED Results / Procedures / Treatments   Labs (all labs ordered are listed, but only abnormal results are displayed) Labs Reviewed  CBC WITH DIFFERENTIAL/PLATELET - Abnormal; Notable for the following components:      Result Value   RBC 3.41 (*)    Hemoglobin 10.9 (*)    HCT 34.3 (*)    MCV 100.6 (*)    Platelets 105 (*)    All other components within normal limits  COMPREHENSIVE METABOLIC PANEL - Abnormal; Notable for the following components:   Glucose, Bld 101 (*)    Total Protein 9.8 (*)    All other components within normal limits  LIPASE, BLOOD - Abnormal; Notable for the following components:   Lipase 54 (*)    All other components within normal limits  PROTIME-INR - Abnormal; Notable for the following components:   Prothrombin Time 18.2 (*)    INR 1.6 (*)    All other components within normal limits  URINALYSIS, ROUTINE W REFLEX MICROSCOPIC    EKG None  Radiology CT ABDOMEN PELVIS W CONTRAST  Result Date: 01/07/2020 CLINICAL DATA:  Back pain since Saturday which began while cleaning EXAM: CT ABDOMEN AND PELVIS WITH CONTRAST TECHNIQUE: Multidetector CT imaging of the abdomen and pelvis was performed using the standard protocol following bolus administration of intravenous contrast. CONTRAST:  159mL OMNIPAQUE IOHEXOL 300 MG/ML  SOLN COMPARISON:  CT 04/10/2018, PET-CT 07/08/2019 FINDINGS: Lower chest: Bandlike areas of opacity in the lung bases likely reflecting atelectasis or scarring as well as several clustered calcified nodules in both lower lobes which are unchanged from comparison. Mild airways thickening and bronchitic changes similar to prior. Lung bases otherwise clear. Mild cardiomegaly is stable from comparison. Pacer leads terminate in the right atrium and ventricle. Coronary artery calcifications. No pericardial  effusion. Hepatobiliary: No worrisome focal liver lesions. Smooth liver surface contour. Normal hepatic attenuation. There is some mild prominence of the biliary tree but which is likely within normal limits accounting for patient age with the common bile duct measuring up to 7 mm. No visible calcified gallstones in the gallbladder or bile ducts. No pericholecystic fluid or inflammation. Pancreas: Unremarkable. No pancreatic ductal dilatation or surrounding inflammatory changes. Spleen: Normal in size. No concerning splenic lesions. Adrenals/Urinary Tract: Normal adrenals. Kidneys enhance and excrete symmetrically. Benign appearing fluid attenuation cyst in the lower pole right kidney measuring up 2.7 cm, minimally increased from 2.5 cm on comparison CT. No worrisome renal lesions. No urolithiasis or hydronephrosis. Likely areas of cortical scarring bilaterally. Urinary bladder is unremarkable. Stomach/Bowel: Ms. Chaney Born accounting for underdistention. Duodenal sweep with a normal course across the midline abdomen. No small bowel thickening or dilatation. Appendix is not visualized. No focal inflammation the vicinity of the cecum to suggest an occult appendicitis. No colonic dilatation or wall thickening. No evidence of obstruction. Vascular/Lymphatic: No  significant vascular findings are present. No enlarged abdominal or pelvic lymph nodes. Reproductive: Uterus is surgically absent. No concerning adnexal lesions. Other: Postsurgical changes of the low anterior abdominal wall. No bowel containing hernia. No abdominopelvic free fluid or air. Musculoskeletal: The osseous structures appear diffusely demineralized which may limit detection of small or nondisplaced fractures. There is an acute superior endplate incomplete burst fracture (AO spine A3) with minimal height loss of up to 10% and no significant retropulsion. No other acute fracture or traumatic osseous injuries are identified. Minimal surrounding  paravertebral thickening. No visible canal hematoma. Posterior elements appear intact. Background of multilevel degenerative changes throughout the included thoracolumbar spine. Unchanged grade 1 anterolisthesis L3 on L4 without associated spondylolysis. Partial mineralization of the low lumbar ligaments. Additional degenerative changes in the hips and pelvis as well as features suggesting sequela of prior osteitis pubis. IMPRESSION: 1. Acute superior endplate incomplete burst fracture L1 (AO spine A3) with minimal height loss of up to 10% and no significant retropulsion. Mild associated paravertebral soft tissue thickening. 2. No other acute osseous abnormalities. 3. No other acute abdominopelvic process. 4. Bronchitic changes and basilar scarring in the lung bases. 5. Cardiomegaly. 6. Unchanged grade 1 anterolisthesis L3 on L4 without associated spondylolysis. Electronically Signed   By: Lovena Le M.D.   On: 01/07/2020 06:31    Procedures Procedures (including critical care time)  Medications Ordered in ED Medications - No data to display  ED Course  I have reviewed the triage vital signs and the nursing notes.  Pertinent labs & imaging results that were available during my care of the patient were reviewed by me and considered in my medical decision making (see chart for details).    MDM Rules/Calculators/A&P                         Low back pain for 3 days after fall 2 weeks ago.  However pain did not start until recently.  No bowel or bladder incontinence.  No focal weakness, numbness or tingling.  Low suspicion for cord compression or cauda equina  Patient unable to give a urine sample.  Bladder scan shows 70 cc in her bladder.  Stable anemia noted. INR subtherapeutic at 1.6.  Patient states she is supposed to stop her Coumadin for knee surgery next week.  With ongoing abdominal pain and back pain, CT scan was obtained.  This showed an acute L1 incomplete burst fracture with no  retropulsion.  Discussed with Jinny Blossom NP with [place] [facility].  She recommends TLSO brace, pain control, outpatient follow-up for x-rays. Patient stable for discharge with pain control and is able to ambulate. Followup with neurosurgery and PCP.  Return precautions discussed including worsening pain, weakness, numbness, tingling, bowel or bladder incontinence, fever, vomiting, or any other concerns. Final Clinical Impression(s) / ED Diagnoses Final diagnoses:  Acute midline low back pain without sciatica  Closed burst fracture of lumbar vertebra, initial encounter [facility])    Rx / [place] Orders ED Discharge Orders    None       Zeynab Klett, Annie Main, MD 01/07/20 [phone]

## 2020-01-07 NOTE — ED Triage Notes (Signed)
Pt states she has been having back pain since Saturday, that started when she was cleaning.

## 2020-01-07 NOTE — Progress Notes (Signed)
Orthopedic Tech Progress Note Patient Details:  Courtney Rogers 10-21-1950 [id number] Called in order to [facility] for a TLSO BRACE Patient ID: Courtney Rogers, female   DOB: Sep 28, 1950, 69 y.o.   MRN: [id number]   Courtney Rogers 01/07/2020, 8:08 AM

## 2020-01-08 ENCOUNTER — Ambulatory Visit (HOSPITAL_COMMUNITY): Payer: Medicare Other | Admitting: Nurse Practitioner

## 2020-01-10 ENCOUNTER — Emergency Department (HOSPITAL_COMMUNITY)
Admission: EM | Admit: 2020-01-10 | Discharge: 2020-01-10 | Disposition: A | Discharge status: Home / Self Care | Payer: Medicare Other | Attending: Emergency Medicine | Admitting: Emergency Medicine

## 2020-01-10 ENCOUNTER — Other Ambulatory Visit: Payer: Self-pay

## 2020-01-10 ENCOUNTER — Encounter (HOSPITAL_COMMUNITY): Payer: Self-pay | Admitting: Emergency Medicine

## 2020-01-10 ENCOUNTER — Ambulatory Visit: Payer: Medicare Other | Admitting: Family Medicine

## 2020-01-10 DIAGNOSIS — Y939 Activity, unspecified: Secondary | ICD-10-CM | POA: Insufficient documentation

## 2020-01-10 DIAGNOSIS — Z79899 Other long term (current) drug therapy: Secondary | ICD-10-CM | POA: Insufficient documentation

## 2020-01-10 DIAGNOSIS — I10 Essential (primary) hypertension: Secondary | ICD-10-CM | POA: Diagnosis not present

## 2020-01-10 DIAGNOSIS — W1839XA Other fall on same level, initial encounter: Secondary | ICD-10-CM | POA: Insufficient documentation

## 2020-01-10 DIAGNOSIS — Y999 Unspecified external cause status: Secondary | ICD-10-CM | POA: Diagnosis not present

## 2020-01-10 DIAGNOSIS — Y929 Unspecified place or not applicable: Secondary | ICD-10-CM | POA: Diagnosis not present

## 2020-01-10 DIAGNOSIS — Z7982 Long term (current) use of aspirin: Secondary | ICD-10-CM | POA: Diagnosis not present

## 2020-01-10 DIAGNOSIS — I2510 Atherosclerotic heart disease of native coronary artery without angina pectoris: Secondary | ICD-10-CM | POA: Diagnosis not present

## 2020-01-10 DIAGNOSIS — S32010A Wedge compression fracture of first lumbar vertebra, initial encounter for closed fracture: Secondary | ICD-10-CM | POA: Insufficient documentation

## 2020-01-10 MED ORDER — OXYCODONE-ACETAMINOPHEN 5-325 MG PO TABS: 1.0000 | ORAL_TABLET | Freq: Three times a day (TID) | ORAL | 0 refills | Status: DC | PRN

## 2020-01-10 NOTE — ED Notes (Signed)
Contacted [facility]. They are coming TLSO

## 2020-01-10 NOTE — ED Triage Notes (Addendum)
Pt reports was seen on Tuesday and reports was told had closed burst fracture of lumbar vetebrae and was prescribed pain medication. Pt reports pain medication is not helping pain. Pt reports removed top part of brace "due to it rubbing my pacemaker". Pt reports has an appointment with neurosurgeon next week but reports pain is too much to wait until that appointment. pt is also scheduled to have knee replacement surgery on this coming Tuesday.

## 2020-01-10 NOTE — Discharge Instructions (Signed)
Follow-up with Dr. Marcello Moores from neurosurgery.  Hopefully new brace will suit you better than the old one.  Take the medicines as needed to help with the pain

## 2020-01-10 NOTE — ED Provider Notes (Signed)
[facility] EMERGENCY DEPARTMENT Provider Note   CSN: [id number] Arrival date & time: 01/10/20  0865     History Chief Complaint  Patient presents with  . Back Pain    Courtney Rogers is a 69 y.o. female.  HPI Patient presents with a low back pain.  Seen on Tuesday with today being Friday.  After fall had small lumbar compression fracture.  Given pain medicine and a TLSO brace.  Patient states that she cannot wear the brace because it presses on her pacemaker on her chest.  Also states the pain medicines are not helping.  States the pain does not worsen just not improving.  No numbness or weakness.  She is on Coumadin for pulmonary embolism.    Past Medical History:  Diagnosis Date  . Acute myocardial infarction [facility]) 2009   CAD/no stent medically managed  . Anxiety disorder   . CAD (coronary artery disease)   . Hyperlipidemia   . Hypertension   . IBS (irritable bowel syndrome)   . Overactive bladder   . PE (pulmonary embolism) JUN 2014  . Presence of permanent cardiac pacemaker   . Sleep apnea     Patient Active Problem List   Diagnosis Date Noted  . DDD (degenerative disc disease), cervical 01/02/2020  . Hoarseness of voice 11/27/2019  . 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  . Essential hypertension 12/14/2017  . Iron deficiency anemia 08/03/2017  . Dyspepsia   . Back pain 04/19/2017  . Normocytic anemia 01/12/2017  . Fatty liver 11/03/2015  . Anxiety state 11/03/2015  . Weight loss 04/05/2011  . GERD 06/01/2010  . IRRITABLE BOWEL SYNDROME 07/21/2009  . Non-ulcer dyspepsia 04/30/2009    Past Surgical History:  Procedure Laterality Date  . ABDOMINAL HYSTERECTOMY    . BSO secondary to cyst    . CARDIAC CATHETERIZATION    . COLONOSCOPY  12/2009   Dr. West Carbo, propofol, normal. Next TCS 12/2019  . COLONOSCOPY N/A 05/22/2017   Procedure: COLONOSCOPY;  Surgeon: Danie Binder, MD;  Location: [facility];  Service: Endoscopy;  Laterality: N/A;  10:30am  .  ESOPHAGOGASTRODUODENOSCOPY  05/11/09   schatzki ring/small hiatal hernia/path:gastritis  . ESOPHAGOGASTRODUODENOSCOPY N/A 05/22/2017   Procedure: ESOPHAGOGASTRODUODENOSCOPY (EGD);  Surgeon: Danie Binder, MD;  Location: [facility];  Service: Endoscopy;  Laterality: N/A;  . ESOPHAGOGASTRODUODENOSCOPY (EGD) WITH ESOPHAGEAL DILATION N/A 04/01/2013   [id number] at the gastroesophageal juction/multiple small polyps/mild gastritis  . GIVENS CAPSULE STUDY N/A 06/07/2017   Procedure: GIVENS CAPSULE STUDY;  Surgeon: Danie Binder, MD;  Location: [facility];  Service: Endoscopy;  Laterality: N/A;  7:30am  . INSERT / REPLACE / REMOVE PACEMAKER     Last year per pt.(can't remember date)     OB History    Gravida  3   Para  3   Term  3   Preterm      AB      Living        SAB      TAB      Ectopic      Multiple      Live Births              Family History  Problem Relation Age of Onset  . Colon polyps Neg Hx   . Colon cancer Neg Hx     Social History   Tobacco Use  . Smoking status: Never Smoker  . Smokeless tobacco: Never Used  . Tobacco comment: Never smoked  Vaping Use  . Vaping Use: Never used  Substance Use Topics  . Alcohol use: No    Alcohol/week: 0.0 standard drinks  . Drug use: No    Home Medications Prior to Admission medications   Medication Sig Start Date End Date Taking? Authorizing Provider  acetaminophen (TYLENOL) 500 MG tablet Take 1,000 mg by mouth 2 (two) times daily as needed for moderate pain or headache.    [provider]  albuterol (PROVENTIL HFA;VENTOLIN HFA) 108 (90 BASE) MCG/ACT inhaler Inhale 2 puffs into the lungs every 6 (six) hours as needed for shortness of breath.    [provider]  amLODipine (NORVASC) 5 MG tablet Take 5 mg by mouth daily. 06/18/19   [provider]  aspirin EC 81 MG tablet Take 81 mg by mouth at bedtime.    [provider]  b complex vitamins tablet Take 1 tablet  by mouth daily.    [provider]  busPIRone (BUSPAR) 5 MG tablet Take 5 mg by mouth 2 (two) times daily. 11/25/16   [provider]  Calcium Carbonate-Vitamin D (CALCIUM 600+D PO) Take 1 tablet by mouth 2 (two) times daily.    [provider]  chlorthalidone (HYGROTON) 25 MG tablet Take 25 mg by mouth daily.     [provider]  Cyanocobalamin (B-12) 2500 MCG TABS Take 1 capsule by mouth daily.    [provider]  fluticasone (FLONASE) 50 MCG/ACT nasal spray SPRAY 2 SPRAYS INTO EACH NOSTRIL ONCE DAILY 01/19/18   Caren Macadam, MD  folic acid (FOLVITE) 1 MG tablet TAKE 1 TABLET BY MOUTH ONCE DAILY. 11/20/19   Lockamy, Randi L, NP-C  gabapentin (NEURONTIN) 600 MG tablet Take 600 mg by mouth 2 (two) times daily.  02/14/19   [provider]  HYDROcodone-acetaminophen (NORCO/VICODIN) 5-325 MG tablet Take 1 tablet by mouth every 4 (four) hours as needed. 01/07/20   Rancour, Annie Main, MD  ipratropium-albuterol (DUONEB) 0.5-2.5 (3) MG/3ML SOLN Supposed to be doing 4 times daily,  She has been doing as needed    [provider]  lidocaine (XYLOCAINE) 2 % solution TAKE 2 TEASPOONFULS BEFORE MEALS AND AT BEDTIME AS NEEDED MAY REPEAT EVERY 4 HOURS. (MAX OF 8 DOSES PER DAY) 08/27/19   Annitta Needs, NP  losartan (COZAAR) 50 MG tablet Take 50 mg by mouth 2 (two) times daily. 07/18/19   [provider]  metoprolol succinate (TOPROL-XL) 50 MG 24 hr tablet Take 75 mg by mouth daily.  11/25/16   [provider]  montelukast (SINGULAIR) 10 MG tablet Take 10 mg by mouth daily.     [provider]  nitroGLYCERIN (NITROSTAT) 0.4 MG SL tablet Place 0.4 mg under the tongue every 5 (five) minutes as needed for chest pain.     [provider]  oxyCODONE-acetaminophen (PERCOCET/ROXICET) 5-325 MG tablet Take 1-2 tablets by mouth every 8 (eight) hours as needed for severe pain. 01/10/20   Davonna Belling, MD  pantoprazole (PROTONIX) 40 MG  tablet Take 1 tablet (40 mg total) by mouth 2 (two) times daily before a meal. 03/07/18   Fields, Sandi L, MD  potassium chloride (K-DUR,KLOR-CON) 10 MEQ tablet Take 40 mEq by mouth 2 (two) times daily.     [provider]  Probiotic Product (PROBIOTIC PO) Take 1 capsule by mouth daily. [facility].    [provider]  sertraline (ZOLOFT) 100 MG tablet Take 200 mg by mouth daily.     [provider]  Simethicone (GAS-X PO) Take 2 tablets by mouth daily as needed (gas).    [provider]  simvastatin (ZOCOR) 40 MG tablet Take 40 mg by mouth at bedtime.      [provider]  triamcinolone cream (KENALOG) 0.1 % Apply 1 application topically as needed.  12/31/18   [provider]  vitamin C (ASCORBIC ACID) 500 MG tablet Take 500 mg by mouth daily.    [provider]  warfarin (COUMADIN) 5 MG tablet Take 5 mg by mouth at bedtime. Mon -Thurs   5 mg and Fri -Sun  2.5 mg    [provider]    Allergies    Biaxin [clarithromycin] and Lisinopril  Review of Systems   Review of Systems  Constitutional: Negative for appetite change.  HENT: Negative for congestion.   Respiratory: Negative for shortness of breath.   Cardiovascular: Negative for chest pain.  Genitourinary: Negative for flank pain.  Musculoskeletal: Positive for back pain.  Skin: Negative for wound.  Neurological: Negative for weakness.  Psychiatric/Behavioral: Negative for confusion.    Physical Exam Updated Vital Signs BP 134/75 (BP Location: Left Arm)   Pulse (!) 103   Temp 98.5 F (36.9 C) (Oral)   Resp 16   Ht 5\' 4"  (1.626 m)   Wt 77.1 kg   SpO2 97%   BMI 29.18 kg/m   Physical Exam Vitals and nursing note reviewed.  HENT:     Head: Normocephalic.  Eyes:     Pupils: Pupils are equal, round, and reactive to light.  Pulmonary:     Comments: Pacemaker to left upper chest wall. Musculoskeletal:     Cervical back: Neck supple.     Right  lower leg: No edema.     Left lower leg: No edema.     Comments: Upper lumbar tenderness.  Skin:    Capillary Refill: Capillary refill takes less than 2 seconds.  Neurological:     Mental Status: She is alert and oriented to person, place, and time.     Comments: Sensation and strength intact bilateral lower extremities.     ED Results / Procedures / Treatments   Labs (all labs ordered are listed, but only abnormal results are displayed) Labs Reviewed - No data to display  EKG None  Radiology No results found.  Procedures Procedures (including critical care time)  Medications Ordered in ED Medications - No data to display  ED Course  I have reviewed the triage vital signs and the nursing notes.  Pertinent labs & imaging results that were available during my care of the patient were reviewed by me and considered in my medical decision making (see chart for details).    MDM Rules/Calculators/A&P                          Patient presents with continued pain from lumbar compression fracture.  Not worsening just not improving.  Not controlled by oral medications and the brace she had on.  Discussed with Dr. Christella Noa from neurosurgery.  Recommends getting a different brace that would fit better.  They have come and fitted this.  Feeling somewhat better.  We will also adjust medications to oxycodone from hydrocodone.  Other complications such as epidural hematoma felt less likely even though she is on Coumadin.  Pain is been stable and not worsening.  Also has pacemaker so not a good candidate for MRI.  Discharge home with follow-up with Dr. Marcello Moores as  planned. Final Clinical Impression(s) / ED Diagnoses Final diagnoses:  Compression fracture of L1 vertebra, initial encounter [facility])    Rx / DC Orders ED Discharge Orders         Ordered    oxyCODONE-acetaminophen (PERCOCET/ROXICET) 5-325 MG tablet  Every 8 hours PRN        01/10/20 1256           Davonna Belling,  MD 01/10/20 1327

## 2020-01-10 NOTE — ED Notes (Signed)
Have paged ortho fitting in [place],.

## 2020-01-10 NOTE — Progress Notes (Signed)
Orthopedic Tech Progress Note Patient Details:  RAMLA HASE [birth year] [id number] Spoke with RN about TLSO BRACE pressing against  PACEMAKER on patient. So I called to [facility] to have someone come out and make adjustments to brace. Patient ID: DELISE SIMENSON, female   DOB: 18-Sep-1950, 69 y.o.   MRN: [id number]   Janit Pagan 01/10/2020, 9:21 AM

## 2020-01-13 ENCOUNTER — Other Ambulatory Visit (HOSPITAL_COMMUNITY): Payer: Self-pay | Admitting: Hematology

## 2020-01-13 ENCOUNTER — Other Ambulatory Visit (HOSPITAL_COMMUNITY)
Admission: RE | Admit: 2020-01-13 | Discharge: 2020-01-13 | Disposition: A | Discharge status: Home / Self Care | Payer: Medicare Other | Source: Ambulatory Visit | Attending: Family Medicine | Admitting: Family Medicine

## 2020-01-13 DIAGNOSIS — E876 Hypokalemia: Secondary | ICD-10-CM | POA: Diagnosis present

## 2020-01-13 LAB — BASIC METABOLIC PANEL
Anion gap: 13 (ref 5–15)
BUN: 28 mg/dL — ABNORMAL HIGH (ref 8–23)
CO2: 23 mmol/L (ref 22–32)
Calcium: 9.7 mg/dL (ref 8.9–10.3)
Chloride: 100 mmol/L (ref 98–111)
Creatinine, Ser: 1.06 mg/dL — ABNORMAL HIGH (ref 0.44–1.00)
GFR calc Af Amer: >60 mL/min (ref >60)
GFR calc non Af Amer: 53 mL/min — ABNORMAL LOW (ref >60)
Glucose, Bld: 103 mg/dL — ABNORMAL HIGH (ref 70–99)
Potassium: 4.3 mmol/L (ref 3.5–5.1)
Sodium: 136 mmol/L (ref 135–145)

## 2020-01-14 DIAGNOSIS — Z6828 Body mass index (BMI) 28.0-28.9, adult: Secondary | ICD-10-CM | POA: Insufficient documentation

## 2020-01-21 ENCOUNTER — Encounter: Payer: Self-pay | Admitting: Neurology

## 2020-01-23 ENCOUNTER — Other Ambulatory Visit (HOSPITAL_COMMUNITY): Payer: Self-pay | Admitting: Family Medicine

## 2020-01-23 DIAGNOSIS — Z1231 Encounter for screening mammogram for malignant neoplasm of breast: Secondary | ICD-10-CM

## 2020-01-24 ENCOUNTER — Other Ambulatory Visit: Payer: Self-pay

## 2020-01-24 ENCOUNTER — Inpatient Hospital Stay (HOSPITAL_COMMUNITY): Payer: Medicare Other | Attending: Hematology | Admitting: Nurse Practitioner

## 2020-01-24 DIAGNOSIS — D729 Disorder of white blood cells, unspecified: Secondary | ICD-10-CM

## 2020-01-24 DIAGNOSIS — Z79899 Other long term (current) drug therapy: Secondary | ICD-10-CM | POA: Insufficient documentation

## 2020-01-24 DIAGNOSIS — D500 Iron deficiency anemia secondary to blood loss (chronic): Secondary | ICD-10-CM | POA: Insufficient documentation

## 2020-01-24 DIAGNOSIS — R296 Repeated falls: Secondary | ICD-10-CM | POA: Diagnosis not present

## 2020-01-24 DIAGNOSIS — C9000 Multiple myeloma not having achieved remission: Secondary | ICD-10-CM | POA: Diagnosis not present

## 2020-01-24 DIAGNOSIS — Z7901 Long term (current) use of anticoagulants: Secondary | ICD-10-CM | POA: Diagnosis not present

## 2020-01-24 DIAGNOSIS — Z86711 Personal history of pulmonary embolism: Secondary | ICD-10-CM | POA: Insufficient documentation

## 2020-01-24 NOTE — Progress Notes (Signed)
[address] [place], [place] [address]   CLINIC:  Medical Oncology/Hematology  PCP:  Perlie Mayo, NP [place] [phone]   REASON FOR VISIT: Follow-up for smoldering myeloma   CURRENT THERAPY: Surveillance   INTERVAL HISTORY:  [name] 69 y.o. female returns for routine follow-up for her smoldering myeloma.  Patient reports she is doing well.  She does report she was hospitalized for a fracture in her back a few weeks ago.  She is recovering and seeing orthopedic doctor for this.  She reports she is still having some back pain. Denies any nausea, vomiting, or diarrhea. Denies any new pains. Had not noticed any recent bleeding such as epistaxis, hematuria or hematochezia. Denies recent chest pain on exertion, shortness of breath on minimal exertion, pre-syncopal episodes, or palpitations. Denies any numbness or tingling in hands or feet. Denies any recent fevers, infections, or recent hospitalizations. Patient reports appetite at 25% and energy level at 25%.     REVIEW OF SYSTEMS:  Review of Systems  Musculoskeletal: Positive for back pain.  All other systems reviewed and are negative.    PAST MEDICAL/SURGICAL HISTORY:  Past Medical History:  Diagnosis Date  . Acute myocardial infarction [facility]) 2009   CAD/no stent medically managed  . Anxiety disorder   . CAD (coronary artery disease)   . Hyperlipidemia   . Hypertension   . IBS (irritable bowel syndrome)   . Overactive bladder   . PE (pulmonary embolism) JUN 2014  . Presence of permanent cardiac pacemaker   . Sleep apnea    Past Surgical History:  Procedure Laterality Date  . ABDOMINAL HYSTERECTOMY    . BSO secondary to cyst    . CARDIAC CATHETERIZATION    . COLONOSCOPY  12/2009   Dr. West Carbo, propofol, normal. Next TCS 12/2019  . COLONOSCOPY N/A 05/22/2017   Procedure: COLONOSCOPY;  Surgeon: Danie Binder, MD;  Location: [facility];  Service:  Endoscopy;  Laterality: N/A;  10:30am  . ESOPHAGOGASTRODUODENOSCOPY  05/11/09   schatzki ring/small hiatal hernia/path:gastritis  . ESOPHAGOGASTRODUODENOSCOPY N/A 05/22/2017   Procedure: ESOPHAGOGASTRODUODENOSCOPY (EGD);  Surgeon: Danie Binder, MD;  Location[facility];  Service: Endoscopy;  Laterality: N/A;  . ESOPHAGOGASTRODUODENOSCOPY (EGD) WITH ESOPHAGEAL DILATION N/A 04/01/2013   [id number] at the gastroesophageal juction/multiple small polyps/mild gastritis  . GIVENS CAPSULE STUDY N/A 06/07/2017   Procedure: GIVENS CAPSULE STUDY;  Surgeon: Danie Binder, MD;  Location: [facility];  Service: Endoscopy;  Laterality: N/A;  7:30am  . INSERT / REPLACE / REMOVE PACEMAKER     Last year per pt.(can't remember date)     SOCIAL HISTORY:  Social History   Socioeconomic History  . Marital status: Married    Spouse name: [name]  . Number of children: 3  . Years of education: Not on file  . Highest education level: Not on file  Occupational History  . Not on file  Tobacco Use  . Smoking status: Never Smoker  . Smokeless tobacco: Never Used  . Tobacco comment: Never smoked   Vaping Use  . Vaping Use: Never used  Substance and Sexual Activity  . Alcohol use: No    Alcohol/week: 0.0 standard drinks  . Drug use: No  . Sexual activity: Not Currently  Other Topics Concern  . Not on file  Social History Narrative   Lives with husband-51 years 9 in Aug 2021   Daughter is close by, 2 sons  live further away   One IN [place],ONE IN [place], [place].        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   Enjoys: reading, young adult      Diet: eats all food groups    Caffeine: coffee 1, tea daily soda-1 daily   Water: 1-2 cups      Wears seat belt    Does not use phone while driving   Oceanographer at home    Public house manager -locked up   Social Determinants of Health   Financial Resource Strain: Low Risk   . Difficulty of Paying Living  Expenses: Not hard at all  Food Insecurity: No Food Insecurity  . Worried About Charity fundraiser in the Last Year: Never true  . Ran Out of Food in the Last Year: Never true  Transportation Needs: No Transportation Needs  . Lack of Transportation (Medical): No  . Lack of Transportation (Non-Medical): No  Physical Activity: Inactive  . Days of Exercise per Week: 0 days  . Minutes of Exercise per Session: 0 min  Stress: No Stress Concern Present  . Feeling of Stress : Not at all  Social Connections: Moderately Integrated  . Frequency of Communication with Friends and Family: More than three times a week  . Frequency of Social Gatherings with Friends and Family: More than three times a week  . Attends Religious Services: More than 4 times per year  . Active Member of Clubs or Organizations: No  . Attends Archivist Meetings: Never  . Marital Status: Married  Human resources officer Violence: Not At Risk  . Fear of Current or Ex-Partner: No  . Emotionally Abused: No  . Physically Abused: No  . Sexually Abused: No    FAMILY HISTORY:  Family History  Problem Relation Age of Onset  . Colon polyps Neg Hx   . Colon cancer Neg Hx     CURRENT MEDICATIONS:  Outpatient Encounter Medications as of 01/24/2020  Medication Sig  . acetaminophen (TYLENOL) 500 MG tablet Take 1,000 mg by mouth 2 (two) times daily as needed for moderate pain or headache.  . albuterol (PROVENTIL HFA;VENTOLIN HFA) 108 (90 BASE) MCG/ACT inhaler Inhale 2 puffs into the lungs every 6 (six) hours as needed for shortness of breath.  Marland Kitchen amLODipine (NORVASC) 5 MG tablet Take 5 mg by mouth daily.  Marland Kitchen aspirin EC 81 MG tablet Take 81 mg by mouth at bedtime.  Marland Kitchen b complex vitamins tablet Take 1 tablet by mouth daily.  . busPIRone (BUSPAR) 5 MG tablet Take 5 mg by mouth 2 (two) times daily.  . Calcium Carbonate-Vitamin D (CALCIUM 600+D PO) Take 1 tablet by mouth 2 (two) times daily.  . chlorthalidone (HYGROTON) 25 MG tablet  Take 25 mg by mouth daily.   . Cyanocobalamin (B-12) 2500 MCG TABS Take 1 capsule by mouth daily.  . diclofenac (CATAFLAM) 50 MG tablet Take 50 mg by mouth 2 (two) times daily.  . fluticasone (FLONASE) 50 MCG/ACT nasal spray SPRAY 2 SPRAYS INTO EACH NOSTRIL ONCE DAILY  . folic acid (FOLVITE) 1 MG tablet TAKE 1 TABLET BY MOUTH ONCE DAILY.  Marland Kitchen gabapentin (NEURONTIN) 600 MG tablet Take 600 mg by mouth 2 (two) times daily.   Marland Kitchen HYDROcodone-acetaminophen (NORCO/VICODIN) 5-325 MG tablet Take 1 tablet by mouth every 4 (four) hours as needed.  Marland Kitchen ipratropium-albuterol (DUONEB) 0.5-2.5 (3) MG/3ML SOLN Supposed to be doing 4 times daily,  She has been doing  as needed  . lidocaine (XYLOCAINE) 2 % solution TAKE 2 TEASPOONFULS BEFORE MEALS AND AT BEDTIME AS NEEDED MAY REPEAT EVERY 4 HOURS. (MAX OF 8 DOSES PER DAY)  . losartan (COZAAR) 50 MG tablet Take 50 mg by mouth 2 (two) times daily.  . metoprolol succinate (TOPROL-XL) 50 MG 24 hr tablet Take 75 mg by mouth daily.   . montelukast (SINGULAIR) 10 MG tablet Take 10 mg by mouth daily.   . nitroGLYCERIN (NITROSTAT) 0.4 MG SL tablet Place 0.4 mg under the tongue every 5 (five) minutes as needed for chest pain.   Marland Kitchen oxyCODONE-acetaminophen (PERCOCET/ROXICET) 5-325 MG tablet Take 1-2 tablets by mouth every 8 (eight) hours as needed for severe pain.  . pantoprazole (PROTONIX) 40 MG tablet Take 1 tablet (40 mg total) by mouth 2 (two) times daily before a meal.  . potassium chloride (K-DUR,KLOR-CON) 10 MEQ tablet Take 40 mEq by mouth 2 (two) times daily.   . Probiotic Product (PROBIOTIC PO) Take 1 capsule by mouth daily. [facility].  . sertraline (ZOLOFT) 100 MG tablet Take 200 mg by mouth daily.   . Simethicone (GAS-X PO) Take 2 tablets by mouth daily as needed (gas).  . simvastatin (ZOCOR) 40 MG tablet Take 40 mg by mouth at bedtime.    . triamcinolone cream (KENALOG) 0.1 % Apply 1 application topically as needed.   . vitamin C (ASCORBIC ACID) 500 MG tablet  Take 500 mg by mouth daily.  Marland Kitchen warfarin (COUMADIN) 2.5 MG tablet Take 2.5 mg by mouth daily.  Marland Kitchen warfarin (COUMADIN) 5 MG tablet Take 5 mg by mouth at bedtime. Mon -Thurs   5 mg and Fri -Sun  2.5 mg (Patient not taking: Reported on 01/24/2020)   No facility-administered encounter medications on file as of 01/24/2020.    ALLERGIES:  Allergies  Allergen Reactions  . Biaxin [Clarithromycin] Other (See Comments)    Stomach problems   . Lisinopril Swelling     PHYSICAL EXAM:  ECOG Performance status: 1  Vitals:   01/24/20 0857  BP: 138/85  Pulse: 82  Resp: 16  Temp: 98 F (36.7 C)  SpO2: 100%   Filed Weights   01/24/20 0857  Weight: 161 lb 6 oz (73.2 kg)   Physical Exam Constitutional:      Appearance: Normal appearance. She is normal weight.  Cardiovascular:     Rate and Rhythm: Normal rate and regular rhythm.     Heart sounds: Normal heart sounds.  Pulmonary:     Effort: Pulmonary effort is normal.     Breath sounds: Normal breath sounds.  Abdominal:     General: Bowel sounds are normal.     Palpations: Abdomen is soft.  Musculoskeletal:        General: Normal range of motion.  Skin:    General: Skin is warm.  Neurological:     Mental Status: She is alert and oriented to person, place, and time. Mental status is at baseline.  Psychiatric:        Mood and Affect: Mood normal.        Behavior: Behavior normal.        Thought Content: Thought content normal.        Judgment: Judgment normal.      LABORATORY DATA:  I have reviewed the labs as listed.  CBC    Component Value Date/Time   WBC 8.2 01/07/2020 0505   RBC 3.41 (L) 01/07/2020 0505   HGB 10.9 (L) 01/07/2020 0505  HCT 34.3 (L) 01/07/2020 0505   PLT 105 (L) 01/07/2020 0505   MCV 100.6 (H) 01/07/2020 0505   MCH 32.0 01/07/2020 0505   MCHC 31.8 01/07/2020 0505   RDW 14.3 01/07/2020 0505   LYMPHSABS 1.7 01/07/2020 0505   MONOABS 0.7 01/07/2020 0505   EOSABS 0.2 01/07/2020 0505   BASOSABS 0.0  01/07/2020 0505   CMP Latest Ref Rng & Units 01/13/2020 01/07/2020 12/25/2019  Glucose 70 - 99 mg/dL 103(H) 101(H) 101(H)  BUN 8 - 23 mg/dL 28(H) 19 15  Creatinine 0.44 - 1.00 mg/dL 1.06(H) 0.95 0.86  Sodium 135 - 145 mmol/L 136 138 139  Potassium 3.5 - 5.1 mmol/L 4.3 3.6 3.2(L)  Chloride 98 - 111 mmol/L 100 103 99  CO2 22 - 32 mmol/L 23 22 26   Calcium 8.9 - 10.3 mg/dL 9.7 9.4 9.3  Total Protein 6.5 - 8.1 g/dL - 9.8(H) 9.6(H)  Total Bilirubin 0.3 - 1.2 mg/dL - 0.8 0.6  Alkaline Phos 38 - 126 U/L - 56 66  AST 15 - 41 U/L - 30 26  ALT 0 - 44 U/L - 18 16    All questions were answered to patient's stated satisfaction. Encouraged patient to call with any new concerns or questions before his next visit to the cancer center and we can certain see him sooner, if needed.     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  Increased number of atypical plasma cells present 36% of all cell lines.  Plasma cells are kappa light chain restricted. -Unfortunately her specimen has reached cytogenetics and FISH lab 1 week later due to bad weather and no viable plasma cells. -PET scan on 07/09/2019 showed no findings of active myeloma. -4-hour urine shows total protein 59 mg.  Urine immunofixation shows IgA kappa monoclonal protein.  LDH is 184 -Based on the [facility]'s criteria she has high risk with about 45 to 50% probability of progression to myeloma within the next 2 years. -Labs done on 12/25/2019 showed M spike of 2.5, kappa light chains are 21.8, lambda light chains are 6.3, ratio is 3.46, creatinine is 0.86, calcium is 9.3, hemoglobin is 10.8 -We will see her back in 1 month with repeat labs  2.  Iron deficiency anemia: - Due to GI blood loss. - Patient is being seen by Dr. Barney Drain and underwent an extensive GI work-up for lower GI bleed in 2018, without any significant source  found.  She also has a longstanding history of dyspepsia. -She last received IV iron on 11/21/2018 -She denies any bright red bleeding per rectum or melena -Labs on 12/25/2019 showed her hemoglobin 10.6, ferritin 413, percent saturation 21 -We will hold off on iron infusions at this time. -She will follow-up in 4 months with repeat labs  3.  History of PE: -She remains on Coumadin. -INRs are monitored through her PCP. -She reports they are having problems getting her Coumadin levels therapeutic. -She also reports she is having frequent falls and she is bruised due to them.     Orders placed this encounter:  Orders Placed This Encounter  Procedures  . CBC with Differential/Platelet  . Comprehensive metabolic panel  . Ferritin  . Iron and TIBC  . Lactate dehydrogenase  . Protein electrophoresis, serum  . Kappa/lambda light chains  . Vitamin B12  . VITAMIN D 25 Hydroxy (Vit-D Deficiency, Fractures)  . Folate  Francene Finders, FNP-C [place] [phone]

## 2020-01-24 NOTE — Assessment & Plan Note (Signed)
1.  IgA kappa smoldering myeloma: -BMBX on 07/09/2019 showed hypercellular marrow for age with trilineage hematopoiesis.  Increased number of atypical plasma cells present 36% of all cell lines.  Plasma cells are kappa light chain restricted. -Unfortunately her specimen has reached cytogenetics and FISH lab 1 week later due to bad weather and no viable plasma cells. -PET scan on 07/09/2019 showed no findings of active myeloma. -4-hour urine shows total protein 59 mg.  Urine immunofixation shows IgA kappa monoclonal protein.  LDH is 184 -Based on the [facility]'s criteria she has high risk with about 45 to 50% probability of progression to myeloma within the next 2 years. -Labs done on 12/25/2019 showed M spike of 2.5, kappa light chains are 21.8, lambda light chains are 6.3, ratio is 3.46, creatinine is 0.86, calcium is 9.3, hemoglobin is 10.8 -We will see her back in 1 month with repeat labs  2.  Iron deficiency anemia: - Due to GI blood loss. - Patient is being seen by Dr. Barney Drain and underwent an extensive GI work-up for lower GI bleed in 2018, without any significant source found.  She also has a longstanding history of dyspepsia. -She last received IV iron on 11/21/2018 -She denies any bright red bleeding per rectum or melena -Labs on 12/25/2019 showed her hemoglobin 10.6, ferritin 413, percent saturation 21 -We will hold off on iron infusions at this time. -She will follow-up in 4 months with repeat labs  3.  History of PE: -She remains on Coumadin. -INRs are monitored through her PCP. -She reports they are having problems getting her Coumadin levels therapeutic. -She also reports she is having frequent falls and she is bruised due to them.

## 2020-01-28 ENCOUNTER — Other Ambulatory Visit (HOSPITAL_COMMUNITY): Payer: Self-pay | Admitting: Family Medicine

## 2020-01-28 DIAGNOSIS — S32001D Stable burst fracture of unspecified lumbar vertebra, subsequent encounter for fracture with routine healing: Secondary | ICD-10-CM

## 2020-02-07 ENCOUNTER — Ambulatory Visit (HOSPITAL_COMMUNITY): Payer: Medicare Other

## 2020-02-07 ENCOUNTER — Other Ambulatory Visit: Payer: Self-pay

## 2020-02-07 ENCOUNTER — Ambulatory Visit (HOSPITAL_COMMUNITY)
Admission: RE | Admit: 2020-02-07 | Discharge: 2020-02-07 | Disposition: A | Discharge status: Home / Self Care | Payer: Medicare Other | Source: Ambulatory Visit | Attending: Family Medicine | Admitting: Family Medicine

## 2020-02-07 DIAGNOSIS — Z1231 Encounter for screening mammogram for malignant neoplasm of breast: Secondary | ICD-10-CM

## 2020-02-07 DIAGNOSIS — Z13820 Encounter for screening for osteoporosis: Secondary | ICD-10-CM | POA: Insufficient documentation

## 2020-02-07 DIAGNOSIS — X58XXXD Exposure to other specified factors, subsequent encounter: Secondary | ICD-10-CM | POA: Insufficient documentation

## 2020-02-07 DIAGNOSIS — S32001D Stable burst fracture of unspecified lumbar vertebra, subsequent encounter for fracture with routine healing: Secondary | ICD-10-CM | POA: Insufficient documentation

## 2020-02-07 DIAGNOSIS — Z780 Asymptomatic menopausal state: Secondary | ICD-10-CM | POA: Insufficient documentation

## 2020-02-10 ENCOUNTER — Ambulatory Visit (HOSPITAL_COMMUNITY): Payer: Medicare Other

## 2020-02-17 ENCOUNTER — Other Ambulatory Visit (HOSPITAL_COMMUNITY): Payer: Self-pay | Admitting: Family Medicine

## 2020-02-17 ENCOUNTER — Inpatient Hospital Stay
Admission: RE | Admit: 2020-02-17 | Discharge: 2020-02-17 | Disposition: A | Discharge status: Home / Self Care | Payer: Self-pay | Source: Ambulatory Visit | Attending: Family Medicine | Admitting: Family Medicine

## 2020-02-17 DIAGNOSIS — Z1231 Encounter for screening mammogram for malignant neoplasm of breast: Secondary | ICD-10-CM

## 2020-02-27 ENCOUNTER — Other Ambulatory Visit: Payer: Self-pay

## 2020-02-27 ENCOUNTER — Ambulatory Visit (INDEPENDENT_AMBULATORY_CARE_PROVIDER_SITE_OTHER): Payer: Medicare Other | Admitting: Family Medicine

## 2020-02-27 ENCOUNTER — Encounter: Payer: Self-pay | Admitting: Family Medicine

## 2020-02-27 ENCOUNTER — Ambulatory Visit: Payer: Medicare Other | Admitting: Family Medicine

## 2020-02-27 VITALS — BP 107/66 | HR 75 | Temp 97.7°F | Resp 18 | Ht 64.0 in | Wt 162.8 lb

## 2020-02-27 DIAGNOSIS — M545 Low back pain, unspecified: Secondary | ICD-10-CM

## 2020-02-27 DIAGNOSIS — M62838 Other muscle spasm: Secondary | ICD-10-CM | POA: Insufficient documentation

## 2020-02-27 DIAGNOSIS — R490 Dysphonia: Secondary | ICD-10-CM | POA: Diagnosis not present

## 2020-02-27 DIAGNOSIS — I10 Essential (primary) hypertension: Secondary | ICD-10-CM | POA: Diagnosis not present

## 2020-02-27 NOTE — Assessment & Plan Note (Signed)
She had a fall and is now in a partial turtle like brace to protect the L1 spine. She is followed by Ortho Reports it is ok, but hers. She is encouraged to use tylenol.

## 2020-02-27 NOTE — Patient Instructions (Addendum)
  HAPPY FALL!  I appreciate the opportunity to provide you with care for your health and wellness. Today we discussed: back and shoulder pain  Follow up: 4-5 months for CPE with husband-fast morning to mid morning appt  No labs  Referrals today: PT for shoulder and posture work  We will look at referral for hoarseness and see what the delay is.  Please work on holding head up and shoulders up and back.  Please continue to practice social distancing to keep you, your family, and our community safe.  If you must go out, please wear a mask and practice good handwashing.  It was a pleasure to see you and I look forward to continuing to work together on your health and well-being. Please do not hesitate to call the office if you need care or have questions about your care.  Have a wonderful day and week. With Gratitude, Cherly Beach, DNP, AGNP-BC

## 2020-02-27 NOTE — Assessment & Plan Note (Signed)
Well-controlled, continue current medication regime.  As always DASH diet and 30 minutes of walking is suggested as tolerated.

## 2020-02-27 NOTE — Progress Notes (Signed)
Subjective:  Patient ID: Courtney Rogers, female    DOB: 1950/07/11  Age: 69 y.o. MRN: [id number]  CC:  Chief Complaint  Patient presents with  . Back Pain      HPI  HPI   Courtney Rogers is a 69 year old female patient of mine who presents today acute visit secondary to back pain.  She fell and fractured it a while back.  And she is having discomfort still she is seeing a specialist in [place].  And is in a partial turtle brace.  She now reports that her right shoulder is hurting and sometimes it feels like it is pulling is been going on all summer per her.  This is the first that she is mentioned in the office.  She reports that she is using Biofreeze.  But she does not use Tylenol regularly. She denies pain with radiation into either arm. She denies shooting pains down or up the back. She has several questions about how long she is on bed rest, if she can bend and what activities she can do. She is not sure about what the Ortho Dr told her. But says, she is going to call them back to ask.   She is still having hoarseness. Reports that she never heard from them a doctor that we referred her to. Referral is still showing in her chart. Will follow up on this. She continues to denies heartburn issue or trouble with GI symptoms that might be causing this.    She denies having any other issues or concerns today. She is sleeping well.  She is chewing and swallowing okay.  She has no noted changes in bowel or bladder habits.  No blood in urine or stool.  Denies having any changes in memory.  Reports that she has fallen once since having the fracture in her back.  But she is trying to make sure that she walks upright.  She holds her head down and she postures her shoulders forward which make her easy to tumble.  She is willing to go to physical therapy to help with posture in her shoulder.  She denies any skin issues, hearing changes or vision changes.   Today patient denies signs and symptoms  of COVID 19 infection including fever, chills, cough, shortness of breath, and headache. Past Medical, Surgical, Social History, Allergies, and Medications have been Reviewed.   Past Medical History:  Diagnosis Date  . Acute myocardial infarction [facility]) 2009   CAD/no stent medically managed  . Anxiety disorder   . CAD (coronary artery disease)   . Hyperlipidemia   . Hypertension   . IBS (irritable bowel syndrome)   . Non-ulcer dyspepsia 04/30/2009   Qualifier: Diagnosis of  By: Oneida Alar MD, Sandi L   . Overactive bladder   . PE (pulmonary embolism) JUN 2014  . Presence of permanent cardiac pacemaker   . Sleep apnea     Current Meds  Medication Sig  . acetaminophen (TYLENOL) 500 MG tablet Take 1,000 mg by mouth 2 (two) times daily as needed for moderate pain or headache.  . albuterol (PROVENTIL HFA;VENTOLIN HFA) 108 (90 BASE) MCG/ACT inhaler Inhale 2 puffs into the lungs every 6 (six) hours as needed for shortness of breath.  Marland Kitchen amLODipine (NORVASC) 5 MG tablet Take 5 mg by mouth daily.  Marland Kitchen aspirin EC 81 MG tablet Take 81 mg by mouth at bedtime.  Marland Kitchen b complex vitamins tablet Take 1 tablet by mouth daily.  . busPIRone (  BUSPAR) 5 MG tablet Take 5 mg by mouth 2 (two) times daily.  . Calcium Carbonate-Vitamin D (CALCIUM 600+D PO) Take 1 tablet by mouth 2 (two) times daily.  . chlorthalidone (HYGROTON) 25 MG tablet Take 25 mg by mouth daily.   . Cyanocobalamin (B-12) 2500 MCG TABS Take 1 capsule by mouth daily.  . diclofenac (CATAFLAM) 50 MG tablet Take 50 mg by mouth 2 (two) times daily.  . fluticasone (FLONASE) 50 MCG/ACT nasal spray SPRAY 2 SPRAYS INTO EACH NOSTRIL ONCE DAILY  . folic acid (FOLVITE) 1 MG tablet TAKE 1 TABLET BY MOUTH ONCE DAILY.  Marland Kitchen gabapentin (NEURONTIN) 600 MG tablet Take 600 mg by mouth 2 (two) times daily.   Marland Kitchen ipratropium-albuterol (DUONEB) 0.5-2.5 (3) MG/3ML SOLN Supposed to be doing 4 times daily,  She has been doing as needed  . lidocaine (XYLOCAINE) 2 % solution  TAKE 2 TEASPOONFULS BEFORE MEALS AND AT BEDTIME AS NEEDED MAY REPEAT EVERY 4 HOURS. (MAX OF 8 DOSES PER DAY)  . losartan (COZAAR) 50 MG tablet Take 50 mg by mouth 2 (two) times daily.  . metoprolol succinate (TOPROL-XL) 50 MG 24 hr tablet Take 75 mg by mouth daily.   . montelukast (SINGULAIR) 10 MG tablet Take 10 mg by mouth daily.   . nitroGLYCERIN (NITROSTAT) 0.4 MG SL tablet Place 0.4 mg under the tongue every 5 (five) minutes as needed for chest pain.   . potassium chloride (K-DUR,KLOR-CON) 10 MEQ tablet Take 40 mEq by mouth 2 (two) times daily.   . Probiotic Product (PROBIOTIC PO) Take 1 capsule by mouth daily. [employer] [facility].  . sertraline (ZOLOFT) 100 MG tablet Take 200 mg by mouth daily.   . Simethicone (GAS-X PO) Take 2 tablets by mouth daily as needed (gas).  . simvastatin (ZOCOR) 40 MG tablet Take 40 mg by mouth at bedtime.    . triamcinolone cream (KENALOG) 0.1 % Apply 1 application topically as needed.   . vitamin C (ASCORBIC ACID) 500 MG tablet Take 500 mg by mouth daily.  Marland Kitchen warfarin (COUMADIN) 2.5 MG tablet Take 2.5 mg by mouth daily.  Marland Kitchen warfarin (COUMADIN) 5 MG tablet Take 5 mg by mouth at bedtime. Mon -Thurs   5 mg and Fri -Sun  2.5 mg    ROS:  Review of Systems  Constitutional: Negative.   HENT: Negative.   Eyes: Negative.   Respiratory: Negative.   Cardiovascular: Negative.   Gastrointestinal: Negative.   Genitourinary: Negative.   Musculoskeletal: Positive for back pain and joint pain.  Skin: Negative.   Neurological: Negative.   Endo/Heme/Allergies: Negative.   Psychiatric/Behavioral: Negative.      Objective:   Today's Vitals: BP 107/66 (BP Location: Right Arm, Patient Position: Sitting, Cuff Size: Normal)   Pulse 75   Temp 97.7 F (36.5 C) (Temporal)   Resp 18   Ht 5\' 4"  (1.626 m)   Wt 162 lb 12.8 oz (73.8 kg)   SpO2 97%   BMI 27.94 kg/m  Vitals with BMI 02/27/2020 01/24/2020 01/10/2020  Height 5\' 4"  - -  Weight 162 lbs 13 oz 161 lbs 6 oz -    BMI 74.12 87.86 -  Systolic 767 209 470  Diastolic 66 85 75  Pulse 75 82 103     Physical Exam Vitals and nursing note reviewed.  Constitutional:      Appearance: Normal appearance. She is well-developed, well-groomed and overweight.  HENT:     Head: Normocephalic and atraumatic.     Right Ear:  External ear normal.     Left Ear: External ear normal.     Mouth/Throat:     Comments: Mask in place  Eyes:     General: Lids are normal.        Right eye: No discharge.        Left eye: No discharge.     Conjunctiva/sclera: Conjunctivae normal.     Comments: Glasses  Cardiovascular:     Rate and Rhythm: Normal rate and regular rhythm.     Pulses: Normal pulses.     Heart sounds: Normal heart sounds.  Pulmonary:     Effort: Pulmonary effort is normal.     Breath sounds: Normal breath sounds.  Musculoskeletal:     Cervical back: Normal range of motion and neck supple.     Thoracic back: Tenderness present.     Comments: MAE, posture is stooped, and questionable torticollis. Rolling walker with seat present  Noted tenderness along the trapezius spine of the scapula    Skin:    General: Skin is warm.  Neurological:     General: No focal deficit present.     Mental Status: She is alert and oriented to person, place, and time.  Psychiatric:        Mood and Affect: Mood normal.        Behavior: Behavior normal. Behavior is cooperative.        Thought Content: Thought content normal.        Judgment: Judgment normal.     Assessment   1. Low back pain, unspecified back pain laterality, unspecified chronicity, unspecified whether sciatica present   2. Essential hypertension   3. Hoarseness of voice   4. Trapezius muscle spasm     Tests ordered Orders Placed This Encounter  Procedures  . Ambulatory referral to Physical Therapy     Plan: Please see assessment and plan per problem list above.   No orders of the defined types were placed in this  encounter.   Patient to follow-up in 4-5 months for CPE  Perlie Mayo, NP

## 2020-02-27 NOTE — Assessment & Plan Note (Signed)
Muscular strain of the trapezius muscles along the borders of the scapula on the right side.  In addition to poor posturing.  Physical therapy ordered today.  Encouraged her to make sure that she holds her head up and pulls her shoulders back. Patient acknowledged agreement and understanding of the plan.

## 2020-02-27 NOTE — Assessment & Plan Note (Signed)
Still having hoarseness for the last 6 months.  Even with increased Protonix she did not have any and noticed improvement.  ENT referral was made but she reports that she is not followed up with them.  I will look into why the referral was not processed and we will send a new one if needed.

## 2020-03-03 ENCOUNTER — Other Ambulatory Visit (HOSPITAL_COMMUNITY): Payer: Self-pay | Admitting: Surgery

## 2020-03-03 DIAGNOSIS — D472 Monoclonal gammopathy: Secondary | ICD-10-CM

## 2020-03-04 ENCOUNTER — Inpatient Hospital Stay (HOSPITAL_COMMUNITY): Payer: Medicare Other | Attending: Hematology

## 2020-03-04 ENCOUNTER — Other Ambulatory Visit: Payer: Self-pay

## 2020-03-04 DIAGNOSIS — E785 Hyperlipidemia, unspecified: Secondary | ICD-10-CM | POA: Diagnosis not present

## 2020-03-04 DIAGNOSIS — Z7982 Long term (current) use of aspirin: Secondary | ICD-10-CM | POA: Diagnosis not present

## 2020-03-04 DIAGNOSIS — I10 Essential (primary) hypertension: Secondary | ICD-10-CM | POA: Diagnosis not present

## 2020-03-04 DIAGNOSIS — Z79899 Other long term (current) drug therapy: Secondary | ICD-10-CM | POA: Diagnosis not present

## 2020-03-04 DIAGNOSIS — D649 Anemia, unspecified: Secondary | ICD-10-CM | POA: Diagnosis not present

## 2020-03-04 DIAGNOSIS — Z86711 Personal history of pulmonary embolism: Secondary | ICD-10-CM | POA: Insufficient documentation

## 2020-03-04 DIAGNOSIS — Z90710 Acquired absence of both cervix and uterus: Secondary | ICD-10-CM | POA: Insufficient documentation

## 2020-03-04 DIAGNOSIS — C9000 Multiple myeloma not having achieved remission: Secondary | ICD-10-CM | POA: Insufficient documentation

## 2020-03-04 DIAGNOSIS — I252 Old myocardial infarction: Secondary | ICD-10-CM | POA: Insufficient documentation

## 2020-03-04 DIAGNOSIS — Z7901 Long term (current) use of anticoagulants: Secondary | ICD-10-CM | POA: Insufficient documentation

## 2020-03-04 DIAGNOSIS — D472 Monoclonal gammopathy: Secondary | ICD-10-CM

## 2020-03-04 LAB — CBC WITH DIFFERENTIAL/PLATELET
Abs Immature Granulocytes: 0.03 10*3/uL (ref 0.00–0.07)
Basophils Absolute: 0 10*3/uL (ref 0.0–0.1)
Basophils Relative: 1 %
Eosinophils Absolute: 0.2 10*3/uL (ref 0.0–0.5)
Eosinophils Relative: 3 %
HCT: 32.4 % — ABNORMAL LOW (ref 36.0–46.0)
Hemoglobin: 10.3 g/dL — ABNORMAL LOW (ref 12.0–15.0)
Immature Granulocytes: 1 %
Lymphocytes Relative: 25 %
Lymphs Abs: 1.6 10*3/uL (ref 0.7–4.0)
MCH: 31.4 pg (ref 26.0–34.0)
MCHC: 31.8 g/dL (ref 30.0–36.0)
MCV: 98.8 fL (ref 80.0–100.0)
Monocytes Absolute: 0.5 10*3/uL (ref 0.1–1.0)
Monocytes Relative: 8 %
Neutro Abs: 4 10*3/uL (ref 1.7–7.7)
Neutrophils Relative %: 62 %
Platelets: 126 10*3/uL — ABNORMAL LOW (ref 150–400)
RBC: 3.28 MIL/uL — ABNORMAL LOW (ref 3.87–5.11)
RDW: 15.7 % — ABNORMAL HIGH (ref 11.5–15.5)
WBC: 6.4 10*3/uL (ref 4.0–10.5)
nRBC: 0 % (ref 0.0–0.2)

## 2020-03-04 LAB — COMPREHENSIVE METABOLIC PANEL
ALT: 21 U/L (ref 0–44)
AST: 32 U/L (ref 15–41)
Albumin: 3.6 g/dL (ref 3.5–5.0)
Alkaline Phosphatase: 65 U/L (ref 38–126)
Anion gap: 10 (ref 5–15)
BUN: 12 mg/dL (ref 8–23)
CO2: 28 mmol/L (ref 22–32)
Calcium: 9.4 mg/dL (ref 8.9–10.3)
Chloride: 101 mmol/L (ref 98–111)
Creatinine, Ser: 0.81 mg/dL (ref 0.44–1.00)
GFR, Estimated: >60 mL/min (ref >60)
Glucose, Bld: 104 mg/dL — ABNORMAL HIGH (ref 70–99)
Potassium: 4 mmol/L (ref 3.5–5.1)
Sodium: 139 mmol/L (ref 135–145)
Total Bilirubin: 0.7 mg/dL (ref 0.3–1.2)
Total Protein: 9.7 g/dL — ABNORMAL HIGH (ref 6.5–8.1)

## 2020-03-04 LAB — IRON AND TIBC
Iron: 73 ug/dL (ref 28–170)
Saturation Ratios: 24 % (ref 10.4–31.8)
TIBC: 304 ug/dL (ref 250–450)
UIBC: 231 ug/dL

## 2020-03-04 LAB — VITAMIN B12: Vitamin B-12: 935 pg/mL — ABNORMAL HIGH (ref 180–914)

## 2020-03-04 LAB — FOLATE: Folate: 24 ng/mL (ref >5.9)

## 2020-03-04 LAB — VITAMIN D 25 HYDROXY (VIT D DEFICIENCY, FRACTURES): Vit D, 25-Hydroxy: 51.46 ng/mL (ref 30–100)

## 2020-03-04 LAB — LACTATE DEHYDROGENASE: LDH: 152 U/L (ref 98–192)

## 2020-03-04 LAB — FERRITIN: Ferritin: 510 ng/mL — ABNORMAL HIGH (ref 11–307)

## 2020-03-05 LAB — KAPPA/LAMBDA LIGHT CHAINS
Kappa free light chain: 21 mg/L — ABNORMAL HIGH (ref 3.3–19.4)
Kappa, lambda light chain ratio: 7.78 — ABNORMAL HIGH (ref 0.26–1.65)
Lambda free light chains: 2.7 mg/L — ABNORMAL LOW (ref 5.7–26.3)

## 2020-03-05 LAB — PROTEIN ELECTROPHORESIS, SERUM
A/G Ratio: 0.7 (ref 0.7–1.7)
Albumin ELP: 3.9 g/dL (ref 2.9–4.4)
Alpha-1-Globulin: 0.3 g/dL (ref 0.0–0.4)
Alpha-2-Globulin: 0.5 g/dL (ref 0.4–1.0)
Beta Globulin: 1.2 g/dL (ref 0.7–1.3)
Gamma Globulin: 3.8 g/dL — ABNORMAL HIGH (ref 0.4–1.8)
Globulin, Total: 5.8 g/dL — ABNORMAL HIGH (ref 2.2–3.9)
M-Spike, %: 2.8 g/dL — ABNORMAL HIGH
Total Protein ELP: 9.7 g/dL — ABNORMAL HIGH (ref 6.0–8.5)

## 2020-03-11 ENCOUNTER — Other Ambulatory Visit: Payer: Self-pay

## 2020-03-11 ENCOUNTER — Inpatient Hospital Stay (HOSPITAL_BASED_OUTPATIENT_CLINIC_OR_DEPARTMENT_OTHER): Payer: Medicare Other | Admitting: Hematology

## 2020-03-11 VITALS — BP 141/70 | HR 86 | Temp 96.9°F | Resp 16 | Wt 157.1 lb

## 2020-03-11 DIAGNOSIS — C9000 Multiple myeloma not having achieved remission: Secondary | ICD-10-CM | POA: Diagnosis not present

## 2020-03-11 DIAGNOSIS — D472 Monoclonal gammopathy: Secondary | ICD-10-CM | POA: Diagnosis not present

## 2020-03-11 NOTE — Patient Instructions (Signed)
[place] at [facility] Discharge Instructions  You were seen today by Dr. Delton Coombes. He went over your recent results and scans. You will be scheduled for a PET scan.  Dr. Delton Coombes will see you back after your PET scan for follow up.   Thank you for choosing [place] at [facility] to provide your oncology and hematology care.  To afford each patient quality time with our provider, please arrive at least 15 minutes before your scheduled appointment time.   If you have a lab appointment with the [facility] please come in thru the Main Entrance and check in at the main information desk  You need to re-schedule your appointment should you arrive 10 or more minutes late.  We strive to give you quality time with our providers, and arriving late affects you and other patients whose appointments are after yours.  Also, if you no show three or more times for appointments you may be dismissed from the clinic at the providers discretion.     Again, thank you for choosing [facility]  Our hope is that these requests will decrease the amount of time that you wait before being seen by our physicians.       _____________________________________________________________  Should you have questions after your visit to [facility], please contact our office at [phone] between the hours of 8:00 a.m. and 4:30 p.m.  Voicemails left after 4:00 p.m. will not be returned until the following business day.  For prescription refill requests, have your pharmacy contact our office and allow 72 hours.    Cancer Center Support Programs:   > Cancer Support Group  2nd Tuesday of the month 1pm-2pm, Journey Room

## 2020-03-11 NOTE — Progress Notes (Signed)
[place], [place] [address]   CLINIC:  Medical Oncology/Hematology  PCP:  Perlie Mayo, NP [place] / [place] [address]  [phone]  REASON FOR VISIT:  Follow-up for smoldering myeloma  PRIOR THERAPY: None  CURRENT THERAPY: Observation  INTERVAL HISTORY:  Ms. MAURI TEMKIN, a 69 y.o. female, returns for routine follow-up for her smoldering myeloma. Aleja was last seen on 10/15/2019.  Today she reports feeling okay. She is taking Coumadin and reports easy bruising when she bumps into objects, but denies nose bleeds, hematochezia or hematuria. She fell in her bathroom 6 weeks ago after tripping on a rolled-up rug on 8/17 and went to [facility] and is wearing a back brace now. She denies having bone or spinal pain, though she reports having abdominal discomfort when she rotates or bends her torso.  She lives at home with her husband and sister. She is able to do all of her chores and ADL's.   REVIEW OF SYSTEMS:  Review of Systems  Constitutional: Positive for appetite change (50%) and fatigue (50%).  HENT:   Negative for nosebleeds.   Gastrointestinal: Positive for abdominal pain (discomfort when rotating or bending) and diarrhea. Negative for blood in stool.  Genitourinary: Negative for hematuria.   Musculoskeletal: Positive for back pain (4/10 back pain). Negative for arthralgias.  Hematological: Bruises/bleeds easily (easy bruising on Coumadin).  Psychiatric/Behavioral: Positive for depression and sleep disturbance. The patient is nervous/anxious.   All other systems reviewed and are negative.   PAST MEDICAL/SURGICAL HISTORY:  Past Medical History:  Diagnosis Date  . Acute myocardial infarction [facility]) 2009   CAD/no stent medically managed  . Anxiety disorder   . CAD (coronary artery disease)   . Hyperlipidemia   . Hypertension   . IBS (irritable bowel syndrome)   . Non-ulcer dyspepsia 04/30/2009   Qualifier: Diagnosis  of  By: Oneida Alar MD, Sandi L   . Overactive bladder   . PE (pulmonary embolism) JUN 2014  . Presence of permanent cardiac pacemaker   . Sleep apnea    Past Surgical History:  Procedure Laterality Date  . ABDOMINAL HYSTERECTOMY    . BSO secondary to cyst    . CARDIAC CATHETERIZATION    . COLONOSCOPY  12/2009   Dr. West Carbo, propofol, normal. Next TCS 12/2019  . COLONOSCOPY N/A 05/22/2017   Procedure: COLONOSCOPY;  Surgeon: Danie Binder, MD;  [facility];  Service: Endoscopy;  Laterality: N/A;  10:30am  . ESOPHAGOGASTRODUODENOSCOPY  05/11/09   schatzki ring/small hiatal hernia/path:gastritis  . ESOPHAGOGASTRODUODENOSCOPY N/A 05/22/2017   Procedure: ESOPHAGOGASTRODUODENOSCOPY (EGD);  Surgeon: Danie Binder, MD;  [facility]: [facility];  Service: Endoscopy;  Laterality: N/A;  . ESOPHAGOGASTRODUODENOSCOPY (EGD) WITH ESOPHAGEAL DILATION N/A 04/01/2013   CBJ:SEGBTDVVO at the gastroesophageal juction/multiple small polyps/mild gastritis  . GIVENS CAPSULE STUDY N/A 06/07/2017   Procedure: GIVENS CAPSULE STUDY;  Surgeon: Danie Binder, MD;  Location: [facility];  Service: Endoscopy;  Laterality: N/A;  7:30am  . INSERT / REPLACE / REMOVE PACEMAKER     Last year per pt.(can't remember date)    SOCIAL HISTORY:  Social History   Socioeconomic History  . Marital status: Married    Spouse name: [name]  . Number of children: 3  . Years of education: Not on file  . Highest education level: Not on file  Occupational History  . Not on file  Tobacco Use  . Smoking status: Never Smoker  .  Smokeless tobacco: Never Used  . Tobacco comment: Never smoked   Vaping Use  . Vaping Use: Never used  Substance and Sexual Activity  . Alcohol use: No    Alcohol/week: 0.0 standard drinks  . Drug use: No  . Sexual activity: Not Currently  Other Topics Concern  . Not on file  Social History Narrative   Lives with husband-51 years 33 in Aug 2021   Daughter is close by, 2 sons live  further away   One IN [place],ONE IN [place], [place].        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   Enjoys: reading, young adult      Diet: eats all food groups    Caffeine: coffee 1, tea daily soda-1 daily   Water: 1-2 cups      Wears seat belt    Does not use phone while driving   Oceanographer at home    Public house manager -locked up   Social Determinants of Health   Financial Resource Strain: Low Risk   . Difficulty of Paying Living Expenses: Not hard at all  Food Insecurity: No Food Insecurity  . Worried About Charity fundraiser in the Last Year: Never true  . Ran Out of Food in the Last Year: Never true  Transportation Needs: No Transportation Needs  . Lack of Transportation (Medical): No  . Lack of Transportation (Non-Medical): No  Physical Activity: Inactive  . Days of Exercise per Week: 0 days  . Minutes of Exercise per Session: 0 min  Stress: No Stress Concern Present  . Feeling of Stress : Not at all  Social Connections: Moderately Integrated  . Frequency of Communication with Friends and Family: More than three times a week  . Frequency of Social Gatherings with Friends and Family: More than three times a week  . Attends Religious Services: More than 4 times per year  . Active Member of Clubs or Organizations: No  . Attends Archivist Meetings: Never  . Marital Status: Married  Human resources officer Violence: Not At Risk  . Fear of Current or Ex-Partner: No  . Emotionally Abused: No  . Physically Abused: No  . Sexually Abused: No    FAMILY HISTORY:  Family History  Problem Relation Age of Onset  . Colon polyps Neg Hx   . Colon cancer Neg Hx     CURRENT MEDICATIONS:  Current Outpatient Medications  Medication Sig Dispense Refill  . acetaminophen (TYLENOL) 500 MG tablet Take 1,000 mg by mouth 2 (two) times daily as needed for moderate pain or headache.    . albuterol (PROVENTIL HFA;VENTOLIN HFA) 108 (90 BASE)  MCG/ACT inhaler Inhale 2 puffs into the lungs every 6 (six) hours as needed for shortness of breath.    Marland Kitchen amLODipine (NORVASC) 5 MG tablet Take 5 mg by mouth daily.    Marland Kitchen aspirin EC 81 MG tablet Take 81 mg by mouth at bedtime.    Marland Kitchen b complex vitamins tablet Take 1 tablet by mouth daily.    . busPIRone (BUSPAR) 5 MG tablet Take 5 mg by mouth 2 (two) times daily.    . Calcium Carbonate-Vitamin D (CALCIUM 600+D PO) Take 1 tablet by mouth 2 (two) times daily.    . chlorthalidone (HYGROTON) 25 MG tablet Take 25 mg by mouth daily.     . Cyanocobalamin (B-12) 2500 MCG TABS Take 1 capsule by mouth daily.    . diclofenac (CATAFLAM) 50 MG  tablet Take 50 mg by mouth 2 (two) times daily.    . fluticasone (FLONASE) 50 MCG/ACT nasal spray SPRAY 2 SPRAYS INTO EACH NOSTRIL ONCE DAILY 16 g 0  . folic acid (FOLVITE) 1 MG tablet TAKE 1 TABLET BY MOUTH ONCE DAILY. 30 tablet 11  . gabapentin (NEURONTIN) 600 MG tablet Take 600 mg by mouth 2 (two) times daily.     Marland Kitchen ipratropium-albuterol (DUONEB) 0.5-2.5 (3) MG/3ML SOLN Supposed to be doing 4 times daily,  She has been doing as needed    . lidocaine (XYLOCAINE) 2 % solution TAKE 2 TEASPOONFULS BEFORE MEALS AND AT BEDTIME AS NEEDED MAY REPEAT EVERY 4 HOURS. (MAX OF 8 DOSES PER DAY) 300 mL 5  . losartan (COZAAR) 50 MG tablet Take 50 mg by mouth 2 (two) times daily.    . metoprolol succinate (TOPROL-XL) 50 MG 24 hr tablet Take 75 mg by mouth daily.     . montelukast (SINGULAIR) 10 MG tablet Take 10 mg by mouth daily.     . nitroGLYCERIN (NITROSTAT) 0.4 MG SL tablet Place 0.4 mg under the tongue every 5 (five) minutes as needed for chest pain.     . potassium chloride (K-DUR,KLOR-CON) 10 MEQ tablet Take 40 mEq by mouth 2 (two) times daily.     . Probiotic Product (PROBIOTIC PO) Take 1 capsule by mouth daily. [facility].    . sertraline (ZOLOFT) 100 MG tablet Take 200 mg by mouth daily.     . Simethicone (GAS-X PO) Take 2 tablets by mouth daily as needed (gas).     . simvastatin (ZOCOR) 40 MG tablet Take 40 mg by mouth at bedtime.      . triamcinolone cream (KENALOG) 0.1 % Apply 1 application topically as needed.     . vitamin C (ASCORBIC ACID) 500 MG tablet Take 500 mg by mouth daily.    Marland Kitchen warfarin (COUMADIN) 2.5 MG tablet Take 2.5 mg by mouth daily.    Marland Kitchen warfarin (COUMADIN) 5 MG tablet Take 5 mg by mouth at bedtime. Mon -Thurs   5 mg and Fri -Sun  2.5 mg     No current facility-administered medications for this visit.    ALLERGIES:  Allergies  Allergen Reactions  . Biaxin [Clarithromycin] Other (See Comments)    Stomach problems   . Lisinopril Swelling    PHYSICAL EXAM:  Performance status (ECOG): 1 - Symptomatic but completely ambulatory  Vitals:   03/11/20 1054  BP: (!) 141/70  Pulse: 86  Resp: 16  Temp: (!) 96.9 F (36.1 C)  SpO2: 100%   Wt Readings from Last 3 Encounters:  03/11/20 157 lb 1.6 oz (71.3 kg)  02/27/20 162 lb 12.8 oz (73.8 kg)  01/24/20 161 lb 6 oz (73.2 kg)   Physical Exam Vitals reviewed.  Constitutional:      Appearance: Normal appearance.     Comments: Back brace  Cardiovascular:     Rate and Rhythm: Normal rate and regular rhythm.     Pulses: Normal pulses.     Heart sounds: Normal heart sounds.  Pulmonary:     Effort: Pulmonary effort is normal.     Breath sounds: Normal breath sounds.  Neurological:     General: No focal deficit present.     Mental Status: She is alert and oriented to person, place, and time.  Psychiatric:        Mood and Affect: Mood normal.        Behavior: Behavior normal.  LABORATORY DATA:  I have reviewed the labs as listed.  CBC Latest Ref Rng & Units 03/04/2020 01/07/2020 12/25/2019  WBC 4.0 - 10.5 K/uL 6.4 8.2 5.4  Hemoglobin 12.0 - 15.0 g/dL 10.3(L) 10.9(L) 10.6(L)  Hematocrit 36 - 46 % 32.4(L) 34.3(L) 32.9(L)  Platelets 150 - 400 K/uL 126(L) 105(L) 113(L)   CMP Latest Ref Rng & Units 03/04/2020 01/13/2020 01/07/2020  Glucose 70 - 99 mg/dL 104(H) 103(H) 101(H)   BUN 8 - 23 mg/dL 12 28(H) 19  Creatinine 0.44 - 1.00 mg/dL 0.81 1.06(H) 0.95  Sodium 135 - 145 mmol/L 139 136 138  Potassium 3.5 - 5.1 mmol/L 4.0 4.3 3.6  Chloride 98 - 111 mmol/L 101 100 103  CO2 22 - 32 mmol/L 28 23 22   Calcium 8.9 - 10.3 mg/dL 9.4 9.7 9.4  Total Protein 6.5 - 8.1 g/dL 9.7(H) - 9.8(H)  Total Bilirubin 0.3 - 1.2 mg/dL 0.7 - 0.8  Alkaline Phos 38 - 126 U/L 65 - 56  AST 15 - 41 U/L 32 - 30  ALT 0 - 44 U/L 21 - 18      Component Value Date/Time   RBC 3.28 (L) 03/04/2020 1048   MCV 98.8 03/04/2020 1048   MCH 31.4 03/04/2020 1048   MCHC 31.8 03/04/2020 1048   RDW 15.7 (H) 03/04/2020 1048   LYMPHSABS 1.6 03/04/2020 1048   MONOABS 0.5 03/04/2020 1048   EOSABS 0.2 03/04/2020 1048   BASOSABS 0.0 03/04/2020 1048   Lab Results  Component Value Date   LDH 152 03/04/2020   LDH 151 12/25/2019   LDH 170 08/27/2019   Lab Results  Component Value Date   VD25OH 51.46 03/04/2020   VD25OH 39.23 03/11/2019   Lab Results  Component Value Date   TOTALPROTELP 9.7 (H) 03/04/2020   ALBUMINELP 3.9 03/04/2020   A1GS 0.3 03/04/2020   A2GS 0.5 03/04/2020   BETS 1.2 03/04/2020   GAMS 3.8 (H) 03/04/2020   MSPIKE 2.8 (H) 03/04/2020   SPEI Comment 03/04/2020    Lab Results  Component Value Date   KPAFRELGTCHN 21.0 (H) 03/04/2020   LAMBDASER 2.7 (L) 03/04/2020   KAPLAMBRATIO 7.78 (H) 03/04/2020   Lab Results  Component Value Date   TIBC 304 03/04/2020   TIBC 285 12/25/2019   TIBC 294 08/27/2019   FERRITIN 510 (H) 03/04/2020   FERRITIN 413 (H) 12/25/2019   FERRITIN 454 (H) 08/27/2019   IRONPCTSAT 24 03/04/2020   IRONPCTSAT 21 12/25/2019   IRONPCTSAT 26 08/27/2019    DIAGNOSTIC IMAGING:  I have independently reviewed the scans and discussed with the patient. No results found.   ASSESSMENT:  1.  IgA kappa smoldering myeloma: -BMBX on 07/09/2019 shows hypercellular marrow for age with trilineage hematopoiesis.  Increased number of atypical plasma cells present 36%  of all cell lines.  Plasma cells are kappa light chain restricted. -Unfortunately her specimen has reached cytogenetics and FISH lab week later due to bad weather and no viable plasma cells. -PET scan on 07/09/2019 showed no findings of active myeloma. -24-hour urine shows total protein 59 mg.  Urine immunofixation shows IgA kappa monoclonal protein.  LDH is 184. -Based on [facility] criteria she has high risk with about 45-50% probability of progression to myeloma in the next 2 years. -CTAP on 01/07/2020 shows acute superior endplate burst fracture of L1.  Mild associated paravertebral soft tissue thickening.  No other bone abnormalities. -Bone density test on 02/07/2020 shows T score -0.5.   PLAN:  1.  IgA  kappa smoldering myeloma: -She recently had a fracture of the L1 and is wearing a brace. -Bone density on 02/07/2020 shows T score -0.5. -Reviewed myeloma labs from 03/04/2020.  Free light chain ratio increased to 7.78.  M spike increased to 2.8 g.  Hemoglobin is 10.3 and platelet count is 126.  Creatinine and calcium was normal. -Given the abnormal paravertebral soft tissue thickening, I have recommended PET CT scan. -RTC after PET scan.  2.  Normocytic anemia: -Hemoglobin today is 10.3 and more or less stable.  Ferritin is 510 and percent saturation is 24.  X83 and folic acid was normal.  Creatinine was normal.  3.  Pulmonary embolism: -PE in 2015 and is on Coumadin since then. -She reports some bruising in the lower extremities.  Orders placed this encounter:  Orders Placed This Encounter  Procedures  . NM PET Image Restag (PS) Skull Base To Thigh     Derek Jack, MD [place] [phone]   I, Milinda Antis, am acting as a scribe for Dr. Sanda Linger.  I, Derek Jack MD, have reviewed the above documentation for accuracy and completeness, and I agree with the above.

## 2020-03-17 ENCOUNTER — Other Ambulatory Visit: Payer: Self-pay

## 2020-03-17 ENCOUNTER — Encounter: Payer: Self-pay | Admitting: Nurse Practitioner

## 2020-03-17 ENCOUNTER — Ambulatory Visit (INDEPENDENT_AMBULATORY_CARE_PROVIDER_SITE_OTHER): Payer: Medicare Other | Admitting: Nurse Practitioner

## 2020-03-17 VITALS — BP 131/80 | HR 86 | Temp 97.1°F | Ht 64.0 in | Wt 155.2 lb

## 2020-03-17 DIAGNOSIS — R634 Abnormal weight loss: Secondary | ICD-10-CM

## 2020-03-17 DIAGNOSIS — K581 Irritable bowel syndrome with constipation: Secondary | ICD-10-CM | POA: Diagnosis not present

## 2020-03-17 DIAGNOSIS — K219 Gastro-esophageal reflux disease without esophagitis: Secondary | ICD-10-CM | POA: Diagnosis not present

## 2020-03-17 NOTE — Progress Notes (Addendum)
Referring Provider: [facility]* Primary Care Physician:  Perlie Mayo, NP Primary GI:  Dr. Abbey Chatters  Chief Complaint  Patient presents with  . Gastroesophageal Reflux    doing okay  . Abdominal Pain    upper abd, worse when bending over, feels like possible muscle pain  . Irritable Bowel Syndrome    bm's hard at times    HPI:   Courtney Rogers is a 69 y.o. female who presents for follow-up.  The patient was last seen in our office 09/18/2019 for GERD, IBS constipation type, weight loss.  At that time noted abdominal pain fairly well controlled, uses milk of magnesia for constipation, weight improved/stable.  One episode of abdominal pain improved with viscous lidocaine.  Recommended trigger avoidance, Protonix 1-2 times daily, viscous lidocaine as needed, milk of magnesia up to 3 times a day, follow-up in 6 months.  She sees oncology for MGUS, last seen 03/11/2020 by hematology/oncology.  At that time noted to be feeling okay, taking Coumadin and has easy bruising but no significant bleeding noted.  She did have a fall 6 weeks prior because she tripped on a rug.  She went to the emergency department for evaluation and currently using a back brace.  She has abdominal discomfort when she rotates or bends her torso since then.  Hemoglobin at that time was stable at 10.3, platelets stable/improved to 126.  Bone marrow noted to be hypercellular marrow for age, PET scan in February with no findings of active myeloma.  Noted fracture of L1 from fall with bone density scan in September 2021 showing a T score of -0.5.  Recommended repeat PET CT scan given abnormal paravertebral soft tissue thickening and follow-up after PET scan.  Continue Coumadin due to pulmonary embolism in 2015.  PET CT scan scheduled for 03/20/2020.  Today she states she is doing okay overall.  She again notes that she fell recently.  Since then she has had some abdominal discomfort, particularly when she twists  or bends; notes it's right on the rib cage.  Query musculoskeletal etiology given her recent fall and vertebral fracture. She states it "feels like muscles".  Doing pretty well with her stools, occasional constipation and takes Milk of Magnesia for this, which is effective. She was supposed to have a right knee replacement in September, but this was postponed due to fall/back injury. Denies N/V, hematochezia, melena, fever, chills. States she's lost about 20 lbs in 3-4 months subjectively. States she lost her appetite after the fall, which is what she attributes her weight loss to. Objectively down 17 lbs since alst visit 6 months ago. States still not with a great appetite, food doesn't taste good anymore; sometimes she will eat well. Has never used supplements (Ensure caused diarrhea).  Past Medical History:  Diagnosis Date  . Acute myocardial infarction [facility]) 2009   CAD/no stent medically managed  . Anxiety disorder   . CAD (coronary artery disease)   . Hyperlipidemia   . Hypertension   . IBS (irritable bowel syndrome)   . Non-ulcer dyspepsia 04/30/2009   Qualifier: Diagnosis of  By: Oneida Alar MD, Sandi L   . Overactive bladder   . PE (pulmonary embolism) JUN 2014  . Presence of permanent cardiac pacemaker   . Sleep apnea     Past Surgical History:  Procedure Laterality Date  . ABDOMINAL HYSTERECTOMY    . BSO secondary to cyst    . CARDIAC CATHETERIZATION    . COLONOSCOPY  12/2009   Dr. West Carbo, propofol, normal. Next TCS 12/2019  . COLONOSCOPY N/A 05/22/2017   Procedure: COLONOSCOPY;  Surgeon: Danie Binder, MD;  Location: [facility];  Service: Endoscopy;  Laterality: N/A;  10:30am  . ESOPHAGOGASTRODUODENOSCOPY  05/11/09   schatzki ring/small hiatal hernia/path:gastritis  . ESOPHAGOGASTRODUODENOSCOPY N/A 05/22/2017   Procedure: ESOPHAGOGASTRODUODENOSCOPY (EGD);  Surgeon: Danie Binder, MD;  Location: [facility];  Service: Endoscopy;  Laterality: N/A;  .  ESOPHAGOGASTRODUODENOSCOPY (EGD) WITH ESOPHAGEAL DILATION N/A 04/01/2013   [id number] at the gastroesophageal juction/multiple small polyps/mild gastritis  . GIVENS CAPSULE STUDY N/A 06/07/2017   Procedure: GIVENS CAPSULE STUDY;  Surgeon: Danie Binder, MD;  Location: [facility];  Service: Endoscopy;  Laterality: N/A;  7:30am  . INSERT / REPLACE / REMOVE PACEMAKER     Last year per pt.(can't remember date)    Current Outpatient Medications  Medication Sig Dispense Refill  . acetaminophen (TYLENOL) 500 MG tablet Take 1,000 mg by mouth 2 (two) times daily as needed for moderate pain or headache.    . albuterol (PROVENTIL HFA;VENTOLIN HFA) 108 (90 BASE) MCG/ACT inhaler Inhale 2 puffs into the lungs every 6 (six) hours as needed for shortness of breath.    Marland Kitchen amLODipine (NORVASC) 5 MG tablet Take 5 mg by mouth daily.    Marland Kitchen aspirin EC 81 MG tablet Take 81 mg by mouth at bedtime.    Marland Kitchen b complex vitamins tablet Take 1 tablet by mouth daily.    . busPIRone (BUSPAR) 5 MG tablet Take 5 mg by mouth 2 (two) times daily.    . Calcium Carbonate-Vitamin D (CALCIUM 600+D PO) Take 1 tablet by mouth 2 (two) times daily.    . chlorthalidone (HYGROTON) 25 MG tablet Take 25 mg by mouth daily.     . Cyanocobalamin (B-12) 2500 MCG TABS Take 1 capsule by mouth daily.    . diclofenac (CATAFLAM) 50 MG tablet Take 50 mg by mouth 2 (two) times daily.    . fluticasone (FLONASE) 50 MCG/ACT nasal spray SPRAY 2 SPRAYS INTO EACH NOSTRIL ONCE DAILY 16 g 0  . folic acid (FOLVITE) 1 MG tablet TAKE 1 TABLET BY MOUTH ONCE DAILY. 30 tablet 11  . gabapentin (NEURONTIN) 600 MG tablet Take 600 mg by mouth 2 (two) times daily.     Marland Kitchen ipratropium-albuterol (DUONEB) 0.5-2.5 (3) MG/3ML SOLN Supposed to be doing 4 times daily,  She has been doing as needed    . lidocaine (XYLOCAINE) 2 % solution TAKE 2 TEASPOONFULS BEFORE MEALS AND AT BEDTIME AS NEEDED MAY REPEAT EVERY 4 HOURS. (MAX OF 8 DOSES PER DAY) 300 mL 5  . losartan (COZAAR)  50 MG tablet Take 50 mg by mouth 2 (two) times daily.    . metoprolol succinate (TOPROL-XL) 50 MG 24 hr tablet Take 75 mg by mouth daily.     . montelukast (SINGULAIR) 10 MG tablet Take 10 mg by mouth daily.     . nitroGLYCERIN (NITROSTAT) 0.4 MG SL tablet Place 0.4 mg under the tongue every 5 (five) minutes as needed for chest pain.     . potassium chloride (K-DUR,KLOR-CON) 10 MEQ tablet Take 40 mEq by mouth 2 (two) times daily.     . Probiotic Product (PROBIOTIC PO) Take 1 capsule by mouth daily. [facility].    . sertraline (ZOLOFT) 100 MG tablet Take 200 mg by mouth daily.     . Simethicone (GAS-X PO) Take 2 tablets by mouth daily as needed (gas).    Marland Kitchen  simvastatin (ZOCOR) 40 MG tablet Take 40 mg by mouth at bedtime.      . triamcinolone cream (KENALOG) 0.1 % Apply 1 application topically as needed.     . vitamin C (ASCORBIC ACID) 500 MG tablet Take 500 mg by mouth daily.    Marland Kitchen warfarin (COUMADIN) 2.5 MG tablet Take 2.5 mg by mouth daily.    Marland Kitchen warfarin (COUMADIN) 5 MG tablet Take 5 mg by mouth at bedtime. Mon -Thurs   5 mg and Fri -Sun  2.5 mg     No current facility-administered medications for this visit.    Allergies as of 03/17/2020 - Review Complete 03/17/2020  Allergen Reaction Noted  . Biaxin [clarithromycin] Other (See Comments)   . Lisinopril Swelling     Family History  Problem Relation Age of Onset  . Colon polyps Neg Hx   . Colon cancer Neg Hx     Social History   Socioeconomic History  . Marital status: Married    Spouse name: [name]  . Number of children: 3  . Years of education: Not on file  . Highest education level: Not on file  Occupational History  . Not on file  Tobacco Use  . Smoking status: Never Smoker  . Smokeless tobacco: Never Used  . Tobacco comment: Never smoked   Vaping Use  . Vaping Use: Never used  Substance and Sexual Activity  . Alcohol use: No    Alcohol/week: 0.0 standard drinks  . Drug use: No  . Sexual activity: Not  Currently  Other Topics Concern  . Not on file  Social History Narrative   Lives with husband-51 years 25 in Aug 2021   Daughter is close by, 2 sons live further away   One IN [place],ONE IN [place], [place].        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   Enjoys: reading, young adult      Diet: eats all food groups    Caffeine: coffee 1, tea daily soda-1 daily   Water: 1-2 cups      Wears seat belt    Does not use phone while driving   Oceanographer at home    Public house manager -locked up   Social Determinants of Health   Financial Resource Strain: Low Risk   . Difficulty of Paying Living Expenses: Not hard at all  Food Insecurity: No Food Insecurity  . Worried About Charity fundraiser in the Last Year: Never true  . Ran Out of Food in the Last Year: Never true  Transportation Needs: No Transportation Needs  . Lack of Transportation (Medical): No  . Lack of Transportation (Non-Medical): No  Physical Activity: Inactive  . Days of Exercise per Week: 0 days  . Minutes of Exercise per Session: 0 min  Stress: No Stress Concern Present  . Feeling of Stress : Not at all  Social Connections: Moderately Integrated  . Frequency of Communication with Friends and Family: More than three times a week  . Frequency of Social Gatherings with Friends and Family: More than three times a week  . Attends Religious Services: More than 4 times per year  . Active Member of Clubs or Organizations: No  . Attends Archivist Meetings: Never  . Marital Status: Married    Subjective: Review of Systems  Constitutional: Positive for weight loss. Negative for chills, fever and malaise/fatigue.  HENT: Negative for congestion and sore throat.   Respiratory: Negative  for cough and shortness of breath.   Cardiovascular: Negative for chest pain and palpitations.  Gastrointestinal: Positive for abdominal pain (since fall; "feels like muscles") and constipation  (relieved with MoM). Negative for blood in stool, diarrhea, heartburn, melena, nausea and vomiting.  Musculoskeletal: Negative for joint pain and myalgias.  Skin: Negative for rash.  Neurological: Negative for dizziness and weakness.  Endo/Heme/Allergies: Does not bruise/bleed easily.  Psychiatric/Behavioral: Negative for depression. The patient is not nervous/anxious.   All other systems reviewed and are negative.    Objective: BP 131/80   Pulse 86   Temp (!) 97.1 F (36.2 C)   Ht 5\' 4"  (8.676 m)   Wt 155 lb 3.2 oz (70.4 kg)   BMI 26.64 kg/m  Physical Exam Vitals and nursing note reviewed.  Constitutional:      General: She is not in acute distress.    Appearance: Normal appearance. She is well-developed and normal weight. She is not ill-appearing, toxic-appearing or diaphoretic.  HENT:     Head: Normocephalic and atraumatic.     Nose: No congestion or rhinorrhea.  Eyes:     General: No scleral icterus. Cardiovascular:     Rate and Rhythm: Normal rate and regular rhythm.     Heart sounds: Normal heart sounds.  Pulmonary:     Effort: Pulmonary effort is normal. No respiratory distress.     Breath sounds: Normal breath sounds.  Abdominal:     General: Bowel sounds are normal.     Palpations: Abdomen is soft. There is no hepatomegaly, splenomegaly or mass.     Tenderness: There is no abdominal tenderness. There is no guarding or rebound.     Hernia: No hernia is present.  Musculoskeletal:     Comments: Wearing a back brace  Skin:    General: Skin is warm and dry.     Coloration: Skin is not jaundiced.     Findings: No rash.  Neurological:     General: No focal deficit present.     Mental Status: She is alert and oriented to person, place, and time.  Psychiatric:        Attention and Perception: Attention normal.        Mood and Affect: Mood normal.        Speech: Speech normal.        Behavior: Behavior normal.        Thought Content: Thought content normal.         Cognition and Memory: Cognition and memory normal.      Assessment:  Very pleasant 69 year old female presents for follow-up on IBS, GERD, weight loss.  Overall doing well/stable.  Recent fall with significant back pain and noted compression fracture of her vertebrae.  Since then she has had abdominal pain around her rib cage that "feels like muscles" I do not doubt she has had a musculoskeletal injury related to her fall.  Otherwise she is doing well from a GI standpoint  GERD: Currently asymptomatic.  Recommend she continue her current medications and notify us of worsening symptoms  IBS constipation type: Occasional harder stools with minimal straining, uses milk of magnesia when this occurs and has a good result.  Recommend she continue her current medication regimen and notify us of any worsening symptoms  Weight loss: States food does not taste as good anymore.  Sometimes she does eat well.  We had a good discussion on "eat to live" and I have recommended supplements such as Ensure, boost, Premier protein,  muscle milk, Carnation Instant Breakfast, etc.  We discussed finding a supplement that she enjoys for the flavor that she likes.  She states she is previously light Premier protein that she bought for her husband.  I will have her follow-up in 3 months to recheck her weight   Plan: 1. Continue current medication regimen 2. Try BenGay, icy hot, Aspercreme, etc. to help with rib/musculoskeletal pain 3. Remember to "eat to live" 4. Try supplements for stabilization of weight/prevention of further weight loss 5. Follow-up in 3 months 6. Call us for any worsening symptoms    Thank you for allowing [place] to participate in the care of Dola Argyle, DNP, AGNP-C Adult & Gerontological Nurse Practitioner [facility]   03/17/2020 11:27 AM   Disclaimer: This note was dictated with voice recognition software. Similar sounding words can  inadvertently be transcribed and may not be corrected upon review.

## 2020-03-17 NOTE — Progress Notes (Signed)
CC'ED TO PCP 

## 2020-03-17 NOTE — Patient Instructions (Addendum)
Your health issues we discussed today were:   GERD (reflux/heartburn): 1. Glad you are not currently having any symptoms 2. Continue to use "viscous lidocaine" for any worsening GERD symptoms are upper abdominal pain 3. Let us know if you start having any worsening heartburn or reflux  Irritable bowel syndrome, constipation type: 1. I am glad you are doing well 2. Continue to use milk of magnesia to help with your constipation 3. Let us know if you have any worsening constipation that does not get better with milk of magnesia  Weight loss: 1. As we discussed, even if food does not taste as well, remember to "eat to live" 2. Try adding supplements at least once, if not twice a day 3. Some good examples include Ensure, boost, Premier protein, muscle milk, fair life high-protein, Carnation Instant Breakfast 4. You can try different brands and different flavors to see if you can find one that you enjoy 5. Call us if you have any worsening or ongoing weight loss  Overall I recommend:  1. Continue your other current medications 2. Return for follow-up in 3 months 3. Call us for any questions or concerns   At [facility] we value your feedback. You may receive a survey about your visit today. Please share your experience as we strive to create trusting relationships with our patients to provide genuine, compassionate, quality care.  We appreciate your understanding and patience as we review any laboratory studies, imaging, and other diagnostic tests that are ordered as we care for you. Our office policy is 5 business days for review of these results, and any emergent or urgent results are addressed in a timely manner for your best interest. If you do not hear from our office in 1 week, please contact us.   We also encourage the use of MyChart, which contains your medical information for your review as well. If you are not enrolled in this feature, an access code is on this after  visit summary for your convenience. Thank you for allowing [place] to be involved in your care.  It was great to see you today!  I hope you have a great Fall!!

## 2020-03-20 ENCOUNTER — Encounter (HOSPITAL_COMMUNITY): Payer: Medicare Other

## 2020-03-24 ENCOUNTER — Ambulatory Visit (HOSPITAL_COMMUNITY): Payer: Medicare Other | Admitting: Hematology

## 2020-03-25 ENCOUNTER — Other Ambulatory Visit: Payer: Self-pay

## 2020-03-25 ENCOUNTER — Ambulatory Visit (HOSPITAL_COMMUNITY): Payer: Medicare Other | Attending: Family Medicine | Admitting: Physical Therapy

## 2020-03-25 ENCOUNTER — Encounter (HOSPITAL_COMMUNITY): Payer: Self-pay | Admitting: Physical Therapy

## 2020-03-25 DIAGNOSIS — R262 Difficulty in walking, not elsewhere classified: Secondary | ICD-10-CM | POA: Insufficient documentation

## 2020-03-25 DIAGNOSIS — Z9181 History of falling: Secondary | ICD-10-CM | POA: Insufficient documentation

## 2020-03-25 DIAGNOSIS — M6281 Muscle weakness (generalized): Secondary | ICD-10-CM | POA: Diagnosis present

## 2020-03-25 NOTE — Therapy (Signed)
[place], [place], [address] Phone: [phone]   Fax:  [phone]  Physical Therapy Evaluation  Patient Details  Name: Courtney Rogers MRN: [id number] Date of Birth: Aug 17, 1950 Referring Provider (PT): Cherly Beach NP   Encounter Date: 03/25/2020   PT End of Session - 03/25/20 1451    Visit Number 1    Number of Visits 8    Date for PT Re-Evaluation 05/20/20    Authorization Type BCBS medicare primary, BCBS secondary, 40 VL, eval needs to be sent off    Progress Note Due on Visit 10    PT Start Time 1438    PT Stop Time 1515    PT Time Calculation (min) 37 min           Past Medical History:  Diagnosis Date  . Acute myocardial infarction [facility]) 2009   CAD/no stent medically managed  . Anxiety disorder   . CAD (coronary artery disease)   . Hyperlipidemia   . Hypertension   . IBS (irritable bowel syndrome)   . Non-ulcer dyspepsia 04/30/2009   Qualifier: Diagnosis of  By: Oneida Alar MD, Sandi L   . Overactive bladder   . PE (pulmonary embolism) JUN 2014  . Presence of permanent cardiac pacemaker   . Sleep apnea     Past Surgical History:  Procedure Laterality Date  . ABDOMINAL HYSTERECTOMY    . BSO secondary to cyst    . CARDIAC CATHETERIZATION    . COLONOSCOPY  12/2009   Dr. West Carbo, propofol, normal. Next TCS 12/2019  . COLONOSCOPY N/A 05/22/2017   Procedure: COLONOSCOPY;  Surgeon: Danie Binder, MD;  [facility];  Service: Endoscopy;  Laterality: N/A;  10:30am  . ESOPHAGOGASTRODUODENOSCOPY  05/11/09   schatzki ring/small hiatal hernia/path:gastritis  . ESOPHAGOGASTRODUODENOSCOPY N/A 05/22/2017   Procedure: ESOPHAGOGASTRODUODENOSCOPY (EGD);  Surgeon: Danie Binder, MD;  [facility];  Service: Endoscopy;  Laterality: N/A;  . ESOPHAGOGASTRODUODENOSCOPY (EGD) WITH ESOPHAGEAL DILATION N/A 04/01/2013   OAC:[id number] at the gastroesophageal juction/multiple small  polyps/mild gastritis  . GIVENS CAPSULE STUDY N/A 06/07/2017   Procedure: GIVENS CAPSULE STUDY;  Surgeon: Danie Binder, MD;  [facility];  Service: Endoscopy;  Laterality: N/A;  7:30am  . INSERT / REPLACE / REMOVE PACEMAKER     Last year per pt.(can't remember date)    There were no vitals filed for this visit.    Subjective Assessment - 03/25/20 1454    Subjective States that she fell and hurt her  back which is why she is wearing a back brace. States she needed a knee replacement prior to the fall but they put It off but now they are going to do the knee surgeon. (Wearing a brace on her right knee). States she has been having neck pain and upper back pain. States she really wants to start working on her posture and maybe some balance. Reports eventually she wants to work on her knee. Current pain level is 4/10 in knee, is 6/10 in low back.  States balance is worse standing in one place.    Currently in Pain? Yes    Pain Score 6     Pain Location Back    Pain Orientation Lower;Mid    Pain Descriptors / Indicators Aching    Pain Type Chronic pain    Pain Radiating Towards to sides of low back    Pain Onset More than a month ago  Pain Frequency Intermittent    Aggravating Factors  with movement              [facility] PT Assessment - 03/25/20 0001      Assessment   Medical Diagnosis right upper trapezius spasm, fall     Referring Provider (PT) Cherly Beach NP    Prior Therapy yes for neck and shoulders      Balance Screen   Has the patient fallen in the past 6 months Yes    How many times? 3    Has the patient had a decrease in activity level because of a fear of falling?  Yes    Is the patient reluctant to leave their home because of a fear of falling?  Yes      [place] Private residence    Living Arrangements Spouse/significant other    Available Help at Discharge Family    Type of [place] - single  point;Wheelchair - Therapist, nutritional,    Additional Comments husband is not able to care for her.       Prior Function   Level of Independence Independent with basic ADLs;Independent with community mobility with device      Cognition   Overall Cognitive Status Within Functional Limits for tasks assessed      Transfers   Five time sit to stand comments  21.7   no UE use.      Ambulation/Gait   Ambulation/Gait Yes    Ambulation Distance (Feet) 110 Feet    Assistive device --   cane with wider base   Gait Pattern Step-through pattern;Decreased arm swing - right;Decreased arm swing - left;Decreased step length - right;Decreased step length - left;Decreased hip/knee flexion - left;Decreased hip/knee flexion - right   anterior trunk lean, one LOB with PT correction R leg gave   Gait velocity decreased    Gait Comments 2MW, uses cane in right hand , deviated from projected path       Balance   Balance Assessed Yes      Static Standing Balance   Static Standing Balance -  Activities  Single Leg Stance - Left Leg;Single Leg Stance - Right Leg;Tandam Stance - Right Leg;Tandam Stance - Left Leg    Static Standing - Comment/# of Minutes tandem left leg posterior 30 seconds with mild sway; tandem right leg posterior 30 seconds with mild sway; Left single leg stance 8 sec - when looking up; right single leg stance 7 seconds when looking up                      Objective measurements completed on examination: See above findings.               PT Education - 03/25/20 1453    Education Details in current condition. POC, HE, tried to adjust cane but unable to decrease in height, educated in using RW or rollator secondary to fall risk and decreased steadiness with walking.    Person(s) Educated Patient    Methods Explanation    Comprehension Verbalized understanding            PT Short Term Goals - 03/25/20 1422      PT SHORT TERM GOAL #1   Title Patient will be  independent in self management strategies to improve quality of life and functional outcomes.    Time 3    Period Weeks  Status New    Target Date 04/15/20      PT SHORT TERM GOAL #2   Title Patient will report at least 25% improvement in overall symptoms and/or function to demonstrate improved functional mobility    Time 3    Period Weeks    Status New    Target Date 04/15/20             PT Long Term Goals - 03/25/20 1422      PT LONG TERM GOAL #1   Title Patient will be able to perform 5x sit to stand without use of upper extremiteis in less then 18 seconds to demonstrate improved functional strength.    Time 8    Period Weeks    Status New    Target Date 05/20/20      PT LONG TERM GOAL #2   Title Patient will be able to ambulate at least 226 feet in 2 minutes with required assistive device to demonstrate improved ambulatory mobility.    Time 8    Period Weeks    Status New    Target Date 05/20/20      PT LONG TERM GOAL #3   Title Patient will be able to stand on either leg for at least 20 seconds while looking straight ahead with no upper extremity assist to improve static balance.    Time 8    Period Weeks    Status New    Target Date 05/20/20                  Plan - 03/25/20 1523    Clinical Impression Statement Patient presents to therapy with multiple complaints including, right knee pain, neck and shoulder pain, history of falling and current compression fracture in spine. Order for trapezius muscle spasm and falls and patient with desire to improve posture and reduce risk of falling. Educated patient on current POC and answered all questions. Patient would greatly benefit from skilled physical therapy to improve functional mobility and reduce risk of continued falls.    Personal Factors and Comorbidities Comorbidity 2;Comorbidity 1;Comorbidity 3+    Comorbidities history of falls, chronic right knee pain, compression fracture in spine, HTN     Examination-Activity Limitations Locomotion Level;Transfers;Stand;Bend    Examination-Participation Restrictions Cleaning;Meal Prep;Shop    Stability/Clinical Decision Making Stable/Uncomplicated    Clinical Decision Making Low    Rehab Potential Fair    PT Frequency 1x / week    PT Duration 8 weeks    PT Treatment/Interventions ADLs/Self Care Home Management;Aquatic Therapy;Cryotherapy;Balance training;Therapeutic exercise;Therapeutic activities;Stair training;Functional mobility training;Gait training;DME Instruction;Neuromuscular re-education;Patient/family education;Manual techniques    PT Next Visit Plan functional strength, static and dynamic balance    PT Home Exercise Plan initiate next session    Consulted and Agree with Plan of Care Patient           Patient will benefit from skilled therapeutic intervention in order to improve the following deficits and impairments:  Pain, Abnormal gait, Decreased knowledge of use of DME, Decreased balance, Decreased mobility, Decreased strength, Difficulty walking, Postural dysfunction  Visit Diagnosis: Difficulty in walking, not elsewhere classified  Muscle weakness (generalized)  History of falling     Problem List Patient Active Problem List   Diagnosis Date Noted  . Trapezius muscle spasm 02/27/2020  . DDD (degenerative disc disease), cervical 01/02/2020  . Hoarseness of voice 11/27/2019  . 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  . Essential hypertension 12/14/2017  . Iron deficiency anemia 08/03/2017  .  Dyspepsia   . Back pain 04/19/2017  . Normocytic anemia 01/12/2017  . Fatty liver 11/03/2015  . Anxiety state 11/03/2015  . Weight loss 04/05/2011  . GERD 06/01/2010  . IRRITABLE BOWEL SYNDROME 07/21/2009    3:26 PM, 03/25/20 [name], DPT Physical Therapy with [facility]  [phone] office  [place] [address] [place], [place], [address] Phone:  [phone]   Fax:  [phone]  Name: Courtney Rogers MRN: [id number] Date of Birth: 1951/03/07

## 2020-04-01 ENCOUNTER — Other Ambulatory Visit: Payer: Self-pay

## 2020-04-01 ENCOUNTER — Ambulatory Visit (HOSPITAL_COMMUNITY): Payer: Medicare Other | Admitting: Physical Therapy

## 2020-04-01 DIAGNOSIS — R262 Difficulty in walking, not elsewhere classified: Secondary | ICD-10-CM

## 2020-04-01 DIAGNOSIS — M6281 Muscle weakness (generalized): Secondary | ICD-10-CM

## 2020-04-01 DIAGNOSIS — Z9181 History of falling: Secondary | ICD-10-CM

## 2020-04-01 NOTE — Therapy (Signed)
[place], [place], [address] Phone: [phone]   Fax:  [phone]  Physical Therapy Treatment  Patient Details  Name: Courtney Rogers MRN: [id number] Date of Birth: 08-Jul-1950 Referring Provider (PT): Cherly Beach NP   Encounter Date: 04/01/2020   PT End of Session - 04/01/20 1623    Visit Number 2    Number of Visits 8    Date for PT Re-Evaluation 05/20/20    Authorization Type BCBS medicare primary, BCBS secondary, 40 VL, eval needs to be sent off    Progress Note Due on Visit 10    PT Start Time 1535    PT Stop Time 1615    PT Time Calculation (min) 40 min           Past Medical History:  Diagnosis Date  . Acute myocardial infarction [facility]) 2009   CAD/no stent medically managed  . Anxiety disorder   . CAD (coronary artery disease)   . Hyperlipidemia   . Hypertension   . IBS (irritable bowel syndrome)   . Non-ulcer dyspepsia 04/30/2009   Qualifier: Diagnosis of  By: Oneida Alar MD, Sandi L   . Overactive bladder   . PE (pulmonary embolism) JUN 2014  . Presence of permanent cardiac pacemaker   . Sleep apnea     Past Surgical History:  Procedure Laterality Date  . ABDOMINAL HYSTERECTOMY    . BSO secondary to cyst    . CARDIAC CATHETERIZATION    . COLONOSCOPY  12/2009   Dr. West Carbo, propofol, normal. Next TCS 12/2019  . COLONOSCOPY N/A 05/22/2017   Procedure: COLONOSCOPY;  Surgeon: Danie Binder, MD;  [facility];  Service: Endoscopy;  Laterality: N/A;  10:30am  . ESOPHAGOGASTRODUODENOSCOPY  05/11/09   schatzki ring/small hiatal hernia/path:gastritis  . ESOPHAGOGASTRODUODENOSCOPY N/A 05/22/2017   Procedure: ESOPHAGOGASTRODUODENOSCOPY (EGD);  Surgeon: Danie Binder, MD;  [facility];  Service: Endoscopy;  Laterality: N/A;  . ESOPHAGOGASTRODUODENOSCOPY (EGD) WITH ESOPHAGEAL DILATION N/A 04/01/2013   TKW:[id number] at the gastroesophageal juction/multiple small  polyps/mild gastritis  . GIVENS CAPSULE STUDY N/A 06/07/2017   Procedure: GIVENS CAPSULE STUDY;  Surgeon: Danie Binder, MD;  [facility];  Service: Endoscopy;  Laterality: N/A;  7:30am  . INSERT / REPLACE / REMOVE PACEMAKER     Last year per pt.(can't remember date)    There were no vitals filed for this visit.   Subjective Assessment - 04/01/20 1538    Subjective pt states her back is 7/10 and knee is not hurting bad at 4/10.  Pt continues to wear a knee immobilizer.  States she was not given any exercises to be working on.    Currently in Pain? No/denies                             [facility] Adult PT Treatment/Exercise - 04/01/20 0001      Knee/Hip Exercises: Standing   Gait Training 226 feet with SPC      Knee/Hip Exercises: Seated   Long Arc Quad Both;10 reps    Long Arc Quad Limitations 2 sets with 5" holds    Other Seated Knee/Hip Exercises W-backs 10X    Marching 10 reps    Marching Limitations 2 sets with core stab    Sit to Sand 10 reps;without UE support  PT Education - 04/01/20 1541    Education Details reviewed goals and POC moving forward.  Initated a HEP    Person(s) Educated Patient    Methods Explanation    Comprehension Verbalized understanding            PT Short Term Goals - 04/01/20 1539      PT SHORT TERM GOAL #1   Title Patient will be independent in self management strategies to improve quality of life and functional outcomes.    Time 3    Period Weeks    Status On-going    Target Date 04/15/20      PT SHORT TERM GOAL #2   Title Patient will report at least 25% improvement in overall symptoms and/or function to demonstrate improved functional mobility    Time 3    Period Weeks    Status On-going    Target Date 04/15/20             PT Long Term Goals - 04/01/20 1540      PT LONG TERM GOAL #1   Title Patient will be able to perform 5x sit to stand without use of upper extremiteis  in less then 18 seconds to demonstrate improved functional strength.    Time 8    Period Weeks    Status On-going      PT LONG TERM GOAL #2   Title Patient will be able to ambulate at least 226 feet in 2 minutes with required assistive device to demonstrate improved ambulatory mobility.    Time 8    Period Weeks    Status On-going      PT LONG TERM GOAL #3   Title Patient will be able to stand on either leg for at least 20 seconds while looking straight ahead with no upper extremity assist to improve static balance.    Time 8    Period Weeks    Status On-going                 Plan - 04/01/20 1619    Clinical Impression Statement Reviewed goals and POC moving forward.  Began exercises to include sit to stands, seated LAQ and marching.  Worked on ambulation using SPC (came in with all wheel walker and cane today).   Pt reported much better using on Lt vs Rt side.  Given written isntructions for HEP.  No increased pain or reports of pain during or at end of session today.    Personal Factors and Comorbidities Comorbidity 2;Comorbidity 1;Comorbidity 3+    Comorbidities history of falls, chronic right knee pain, compression fracture in spine, HTN    Examination-Activity Limitations Locomotion Level;Transfers;Stand;Bend    Examination-Participation Restrictions Cleaning;Meal Prep;Shop    Stability/Clinical Decision Making Stable/Uncomplicated    Rehab Potential Fair    PT Frequency 1x / week    PT Duration 8 weeks    PT Treatment/Interventions ADLs/Self Care Home Management;Aquatic Therapy;Cryotherapy;Balance training;Therapeutic exercise;Therapeutic activities;Stair training;Functional mobility training;Gait training;DME Instruction;Neuromuscular re-education;Patient/family education;Manual techniques    PT Next Visit Plan functional strength, static and dynamic balance    PT Home Exercise Plan 11/10:  bridge, seated LAQ, marching, sit to stands    Consulted and Agree with Plan of  Care Patient           Patient will benefit from skilled therapeutic intervention in order to improve the following deficits and impairments:  Pain, Abnormal gait, Decreased knowledge of use of DME, Decreased balance, Decreased mobility, Decreased strength, Difficulty  walking, Postural dysfunction  Visit Diagnosis: Muscle weakness (generalized)  History of falling  Difficulty in walking, not elsewhere classified     Problem List Patient Active Problem List   Diagnosis Date Noted  . Trapezius muscle spasm 02/27/2020  . DDD (degenerative disc disease), cervical 01/02/2020  . Hoarseness of voice 11/27/2019  . 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  . Essential hypertension 12/14/2017  . Iron deficiency anemia 08/03/2017  . Dyspepsia   . Back pain 04/19/2017  . Normocytic anemia 01/12/2017  . Fatty liver 11/03/2015  . Anxiety state 11/03/2015  . Weight loss 04/05/2011  . GERD 06/01/2010  . IRRITABLE BOWEL SYNDROME 07/21/2009   Teena Irani, PTA/CLT [phone]  Teena Irani 04/01/2020, 4:24 PM  [place] [address] [place], [place], [address] Phone: [phone]   Fax:  [phone]  Name: Courtney Rogers MRN: [id number] Date of Birth: 09-Oct-1950

## 2020-04-02 ENCOUNTER — Other Ambulatory Visit: Payer: Self-pay

## 2020-04-02 ENCOUNTER — Emergency Department (HOSPITAL_COMMUNITY)
Admission: EM | Admit: 2020-04-02 | Discharge: 2020-04-02 | Disposition: A | Discharge status: Home / Self Care | Payer: Medicare Other | Attending: Emergency Medicine | Admitting: Emergency Medicine

## 2020-04-02 ENCOUNTER — Emergency Department (HOSPITAL_COMMUNITY): Payer: Medicare Other

## 2020-04-02 ENCOUNTER — Encounter (HOSPITAL_COMMUNITY): Payer: Self-pay

## 2020-04-02 DIAGNOSIS — R300 Dysuria: Secondary | ICD-10-CM | POA: Diagnosis not present

## 2020-04-02 DIAGNOSIS — I10 Essential (primary) hypertension: Secondary | ICD-10-CM | POA: Diagnosis not present

## 2020-04-02 DIAGNOSIS — Z7901 Long term (current) use of anticoagulants: Secondary | ICD-10-CM | POA: Diagnosis not present

## 2020-04-02 DIAGNOSIS — Z79899 Other long term (current) drug therapy: Secondary | ICD-10-CM | POA: Insufficient documentation

## 2020-04-02 DIAGNOSIS — I2510 Atherosclerotic heart disease of native coronary artery without angina pectoris: Secondary | ICD-10-CM | POA: Insufficient documentation

## 2020-04-02 DIAGNOSIS — D649 Anemia, unspecified: Secondary | ICD-10-CM | POA: Insufficient documentation

## 2020-04-02 DIAGNOSIS — R109 Unspecified abdominal pain: Secondary | ICD-10-CM | POA: Diagnosis not present

## 2020-04-02 DIAGNOSIS — Z7982 Long term (current) use of aspirin: Secondary | ICD-10-CM | POA: Insufficient documentation

## 2020-04-02 LAB — BASIC METABOLIC PANEL
Anion gap: 13 (ref 5–15)
BUN: 12 mg/dL (ref 8–23)
CO2: 27 mmol/L (ref 22–32)
Calcium: 9.4 mg/dL (ref 8.9–10.3)
Chloride: 98 mmol/L (ref 98–111)
Creatinine, Ser: 0.79 mg/dL (ref 0.44–1.00)
GFR, Estimated: >60 mL/min (ref >60)
Glucose, Bld: 92 mg/dL (ref 70–99)
Potassium: 3.7 mmol/L (ref 3.5–5.1)
Sodium: 138 mmol/L (ref 135–145)

## 2020-04-02 LAB — URINALYSIS, ROUTINE W REFLEX MICROSCOPIC
Bacteria, UA: NONE SEEN
Bilirubin Urine: NEGATIVE
Glucose, UA: NEGATIVE mg/dL
Hgb urine dipstick: NEGATIVE
Ketones, ur: NEGATIVE mg/dL
Leukocytes,Ua: NEGATIVE
Nitrite: NEGATIVE
Protein, ur: 30 mg/dL — AB
Specific Gravity, Urine: 1.015 (ref 1.005–1.030)
pH: 7 (ref 5.0–8.0)

## 2020-04-02 LAB — CBC WITH DIFFERENTIAL/PLATELET
Abs Immature Granulocytes: 0.04 10*3/uL (ref 0.00–0.07)
Basophils Absolute: 0 10*3/uL (ref 0.0–0.1)
Basophils Relative: 1 %
Eosinophils Absolute: 0.1 10*3/uL (ref 0.0–0.5)
Eosinophils Relative: 2 %
HCT: 33 % — ABNORMAL LOW (ref 36.0–46.0)
Hemoglobin: 10.4 g/dL — ABNORMAL LOW (ref 12.0–15.0)
Immature Granulocytes: 1 %
Lymphocytes Relative: 21 %
Lymphs Abs: 1.3 10*3/uL (ref 0.7–4.0)
MCH: 31.8 pg (ref 26.0–34.0)
MCHC: 31.5 g/dL (ref 30.0–36.0)
MCV: 100.9 fL — ABNORMAL HIGH (ref 80.0–100.0)
Monocytes Absolute: 0.6 10*3/uL (ref 0.1–1.0)
Monocytes Relative: 10 %
Neutro Abs: 4 10*3/uL (ref 1.7–7.7)
Neutrophils Relative %: 65 %
Platelets: 103 10*3/uL — ABNORMAL LOW (ref 150–400)
RBC: 3.27 MIL/uL — ABNORMAL LOW (ref 3.87–5.11)
RDW: 15.8 % — ABNORMAL HIGH (ref 11.5–15.5)
WBC: 6.1 10*3/uL (ref 4.0–10.5)
nRBC: 0 % (ref 0.0–0.2)

## 2020-04-02 MED ORDER — ACETAMINOPHEN 500 MG PO TABS
1000.0000 mg | ORAL_TABLET | Freq: Once | ORAL | Status: AC
  Administered 2020-04-02: 1000 mg via ORAL
  Filled 2020-04-02: qty 2

## 2020-04-02 NOTE — ED Provider Notes (Signed)
[facility] EMERGENCY DEPARTMENT Provider Note   CSN: [id number] Arrival date & time: 04/02/20  0746     History Chief Complaint  Patient presents with  . Dysuria    Courtney Rogers is a 69 y.o. female.  Patient presents with urinary frequency, flank pain and fatigue for the past few days.  Mild burning with urination.  No fevers chills or vomiting.  No significant abdominal tenderness.  Patient denies history of kidney stones.        Past Medical History:  Diagnosis Date  . Acute myocardial infarction [facility]) 2009   CAD/no stent medically managed  . Anxiety disorder   . CAD (coronary artery disease)   . Hyperlipidemia   . Hypertension   . IBS (irritable bowel syndrome)   . Non-ulcer dyspepsia 04/30/2009   Qualifier: Diagnosis of  By: Oneida Alar MD, Sandi L   . Overactive bladder   . PE (pulmonary embolism) JUN 2014  . Presence of permanent cardiac pacemaker   . Sleep apnea     Patient Active Problem List   Diagnosis Date Noted  . Trapezius muscle spasm 02/27/2020  . DDD (degenerative disc disease), cervical 01/02/2020  . Hoarseness of voice 11/27/2019  . 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  . Essential hypertension 12/14/2017  . Iron deficiency anemia 08/03/2017  . Dyspepsia   . Back pain 04/19/2017  . Normocytic anemia 01/12/2017  . Fatty liver 11/03/2015  . Anxiety state 11/03/2015  . Weight loss 04/05/2011  . GERD 06/01/2010  . IRRITABLE BOWEL SYNDROME 07/21/2009    Past Surgical History:  Procedure Laterality Date  . ABDOMINAL HYSTERECTOMY    . BSO secondary to cyst    . CARDIAC CATHETERIZATION    . COLONOSCOPY  12/2009   Dr. West Carbo, propofol, normal. Next TCS 12/2019  . COLONOSCOPY N/A 05/22/2017   Procedure: COLONOSCOPY;  Surgeon: Danie Binder, MD;  Location: [facility] END[facility];  Service: Endoscopy;  Laterality: N/A;  10:30am  . ESOPHAGOGASTRODUODENOSCOPY  05/11/09   schatzki ring/small hiatal hernia/path:gastritis  . ESOPHAGOGASTRODUODENOSCOPY N/A  05/22/2017   Procedure: ESOPHAGOGASTRODUODENOSCOPY (EGD);  Surgeon: Danie Binder, MD;  Location: [facility] END[facility] SU[facility];  Service: Endoscopy;  Laterality: N/A;  . ESOPHAGOGASTRODUODENOSCOPY (EGD) WITH ESOPHAGEAL DILATION N/A 04/01/2013   [id number] at the gastroesophageal juction/multiple small polyps/mild gastritis  . GIVENS CAPSULE STUDY N/A 06/07/2017   Procedure: GIVENS CAPSULE STUDY;  Surgeon: Danie Binder, MD;  Location: [facility];  Service: Endoscopy;  Laterality: N/A;  7:30am  . INSERT / REPLACE / REMOVE PACEMAKER     Last year per pt.(can't remember date)     OB History    Gravida  3   Para  3   Term  3   Preterm      AB      Living        SAB      TAB      Ectopic      Multiple      Live Births              Family History  Problem Relation Age of Onset  . Colon polyps Neg Hx   . Colon cancer Neg Hx     Social History   Tobacco Use  . Smoking status: Never Smoker  . Smokeless tobacco: Never Used  . Tobacco comment: Never smoked   Vaping Use  . Vaping Use: Never used  Substance Use Topics  . Alcohol use: No    Alcohol/week: 0.0 standard  drinks  . Drug use: No    Home Medications Prior to Admission medications   Medication Sig Start Date End Date Taking? Authorizing Provider  acetaminophen (TYLENOL) 500 MG tablet Take 1,000 mg by mouth 2 (two) times daily as needed for moderate pain or headache.    [provider]  albuterol (PROVENTIL HFA;VENTOLIN HFA) 108 (90 BASE) MCG/ACT inhaler Inhale 2 puffs into the lungs every 6 (six) hours as needed for shortness of breath.    [provider]  amLODipine (NORVASC) 5 MG tablet Take 5 mg by mouth daily. 06/18/19   [provider]  aspirin EC 81 MG tablet Take 81 mg by mouth at bedtime.    [provider]  b complex vitamins tablet Take 1 tablet by mouth daily.    [provider]  busPIRone (BUSPAR) 5 MG tablet Take 5 mg by mouth 2 (two) times daily. 11/25/16    [provider]  Calcium Carbonate-Vitamin D (CALCIUM 600+D PO) Take 1 tablet by mouth 2 (two) times daily.    [provider]  chlorthalidone (HYGROTON) 25 MG tablet Take 25 mg by mouth daily.     [provider]  Cyanocobalamin (B-12) 2500 MCG TABS Take 1 capsule by mouth daily.    [provider]  diclofenac (CATAFLAM) 50 MG tablet Take 50 mg by mouth 2 (two) times daily. 01/14/20   [provider]  fluticasone (FLONASE) 50 MCG/ACT nasal spray SPRAY 2 SPRAYS INTO EACH NOSTRIL ONCE DAILY 01/19/18   Caren Macadam, MD  folic acid (FOLVITE) 1 MG tablet TAKE 1 TABLET BY MOUTH ONCE DAILY. 01/13/20   Derek Jack, MD  gabapentin (NEURONTIN) 600 MG tablet Take 600 mg by mouth 2 (two) times daily.  02/14/19   [provider]  ipratropium-albuterol (DUONEB) 0.5-2.5 (3) MG/3ML SOLN Supposed to be doing 4 times daily,  She has been doing as needed    [provider]  lidocaine (XYLOCAINE) 2 % solution TAKE 2 TEASPOONFULS BEFORE MEALS AND AT BEDTIME AS NEEDED MAY REPEAT EVERY 4 HOURS. (MAX OF 8 DOSES PER DAY) 08/27/19   Annitta Needs, NP  losartan (COZAAR) 50 MG tablet Take 50 mg by mouth 2 (two) times daily. 07/18/19   [provider]  metoprolol succinate (TOPROL-XL) 50 MG 24 hr tablet Take 75 mg by mouth daily.  11/25/16   [provider]  montelukast (SINGULAIR) 10 MG tablet Take 10 mg by mouth daily.     [provider]  nitroGLYCERIN (NITROSTAT) 0.4 MG SL tablet Place 0.4 mg under the tongue every 5 (five) minutes as needed for chest pain.     [provider]  potassium chloride (K-DUR,KLOR-CON) 10 MEQ tablet Take 40 mEq by mouth 2 (two) times daily.     [provider]  Probiotic Product (PROBIOTIC PO) Take 1 capsule by mouth daily. [facility].    [provider]  sertraline (ZOLOFT) 100 MG tablet Take 200 mg by mouth daily.     [provider]  Simethicone (GAS-X PO)  Take 2 tablets by mouth daily as needed (gas).    [provider]  simvastatin (ZOCOR) 40 MG tablet Take 40 mg by mouth at bedtime.      [provider]  triamcinolone cream (KENALOG) 0.1 % Apply 1 application topically as needed.  12/31/18   [provider]  vitamin C (ASCORBIC ACID) 500 MG tablet Take 500 mg by mouth daily.    [provider]  warfarin (COUMADIN) 2.5 MG tablet Take 2.5 mg by mouth daily. 01/14/20   [provider]  warfarin (COUMADIN) 5 MG tablet Take 5 mg by mouth at bedtime. Mon -Thurs   5 mg and Fri -Sun  2.5 mg    [provider]    Allergies    Biaxin [clarithromycin], Erythromycin, and Lisinopril  Review of Systems   Review of Systems  Constitutional: Positive for fatigue. Negative for chills and fever.  HENT: Negative for congestion.   Eyes: Negative for visual disturbance.  Respiratory: Negative for shortness of breath.   Cardiovascular: Negative for chest pain.  Gastrointestinal: Negative for abdominal pain and vomiting.  Genitourinary: Positive for dysuria and flank pain.  Musculoskeletal: Negative for back pain, neck pain and neck stiffness.  Skin: Negative for rash.  Neurological: Negative for light-headedness and headaches.    Physical Exam Updated Vital Signs BP 138/76   Pulse 91   Temp 97.6 F (36.4 C) (Oral)   Resp 16   Ht 5\' 4"  (1.626 m)   Wt 77.1 kg   SpO2 96%   BMI 29.18 kg/m   Physical Exam Vitals and nursing note reviewed.  Constitutional:      Appearance: She is well-developed.  HENT:     Head: Normocephalic and atraumatic.  Eyes:     General:        Right eye: No discharge.        Left eye: No discharge.     Conjunctiva/sclera: Conjunctivae normal.  Neck:     Trachea: No tracheal deviation.  Cardiovascular:     Rate and Rhythm: Normal rate and regular rhythm.  Pulmonary:     Effort: Pulmonary effort is normal.     Breath sounds: Normal breath sounds.  Abdominal:      General: There is no distension.     Palpations: Abdomen is soft.     Tenderness: There is abdominal tenderness (mild suprapubic). There is no guarding.  Musculoskeletal:        General: Normal range of motion.     Cervical back: Normal range of motion and neck supple.  Skin:    General: Skin is warm.     Findings: No rash.  Neurological:     Mental Status: She is alert and oriented to person, place, and time.     ED Results / Procedures / Treatments   Labs (all labs ordered are listed, but only abnormal results are displayed) Labs Reviewed  URINALYSIS, ROUTINE W REFLEX MICROSCOPIC - Abnormal; Notable for the following components:      Result Value   APPearance HAZY (*)    Protein, ur 30 (*)    All other components within normal limits  CBC WITH DIFFERENTIAL/PLATELET - Abnormal; Notable for the following components:   RBC 3.27 (*)    Hemoglobin 10.4 (*)    HCT 33.0 (*)    MCV 100.9 (*)    RDW 15.8 (*)    Platelets 103 (*)    All other components within normal limits  BASIC METABOLIC PANEL    EKG None  Radiology CT Renal Stone Study  Result Date: 04/02/2020 CLINICAL DATA:  Right flank pain. EXAM: CT ABDOMEN AND PELVIS WITHOUT CONTRAST TECHNIQUE: Multidetector CT imaging of the abdomen and pelvis was performed following the standard protocol without IV contrast. COMPARISON:  January 07, 2020. FINDINGS: Lower chest: No acute abnormality. Hepatobiliary: No focal liver abnormality is seen. No gallstones, gallbladder wall thickening, or biliary dilatation. Pancreas: Unremarkable. No pancreatic ductal  dilatation or surrounding inflammatory changes. Spleen: Normal in size without focal abnormality. Adrenals/Urinary Tract: Adrenal glands are unremarkable. Kidneys are normal, without renal calculi, focal lesion, or hydronephrosis. Bladder is unremarkable. Stomach/Bowel: The stomach appears normal. There is no evidence of bowel obstruction or inflammation. Diverticulosis of descending  and sigmoid colon is noted without inflammation. The appendix is not visualized. Vascular/Lymphatic: No significant vascular findings are present. No enlarged abdominal or pelvic lymph nodes. Reproductive: Status post hysterectomy. No adnexal masses. Other: No abdominal wall hernia or abnormality. No abdominopelvic ascites. Musculoskeletal: No acute or significant osseous findings. IMPRESSION: 1. Diverticulosis of descending and sigmoid colon without inflammation. 2. No acute abnormality seen in the abdomen or pelvis. Electronically Signed   By: Marijo Conception M.D.   On: 04/02/2020 10:50    Procedures Procedures (including critical care time)  Medications Ordered in ED Medications  acetaminophen (TYLENOL) tablet 1,000 mg (1,000 mg Oral Given 04/02/20 1049)    ED Course  I have reviewed the triage vital signs and the nursing notes.  Pertinent labs & imaging results that were available during my care of the patient were reviewed by me and considered in my medical decision making (see chart for details).    MDM Rules/Calculators/A&P                          Patient presents with clinical concern for urine infection versus kidney stone.  Other differentials include kidney infection, viral process, inflammatory bowel, other.  Urinalysis reviewed no signs of infection no white blood cells, negative leukocyte esterase.  Plan for general blood work and CT stone study.  Tylenol given for pain. CT stone study no acute abnormalities.  Blood work reassuring chronic mild anemia at 10 hemoglobin, normal white blood cell count, chronic mild low platelets 103.  Patient stable for outpatient follow-up.   Final Clinical Impression(s) / ED Diagnoses Final diagnoses:  Flank pain, acute  Dysuria  Anemia, unspecified type    Rx / DC Orders ED Discharge Orders    None       Elnora Morrison, MD 04/02/20 1313

## 2020-04-02 NOTE — Discharge Instructions (Addendum)
Your CT scan did not show any acute abnormalities, no kidney stones. Follow-up closely with your primary doctor if symptoms persist. Stay well-hydrated with water and take Tylenol as needed for pain.

## 2020-04-02 NOTE — ED Triage Notes (Signed)
Pt reports urinary frequency, fatigue, and lower back pain for past few days.  Reports burning with urination.

## 2020-04-06 DIAGNOSIS — S32010D Wedge compression fracture of first lumbar vertebra, subsequent encounter for fracture with routine healing: Secondary | ICD-10-CM | POA: Insufficient documentation

## 2020-04-07 ENCOUNTER — Other Ambulatory Visit: Payer: Self-pay

## 2020-04-07 ENCOUNTER — Ambulatory Visit: Payer: Medicare Other | Admitting: Family Medicine

## 2020-04-07 ENCOUNTER — Ambulatory Visit (HOSPITAL_COMMUNITY)
Admission: RE | Admit: 2020-04-07 | Discharge: 2020-04-07 | Disposition: A | Discharge status: Home / Self Care | Payer: Medicare Other | Source: Ambulatory Visit | Attending: Hematology | Admitting: Hematology

## 2020-04-07 DIAGNOSIS — D472 Monoclonal gammopathy: Secondary | ICD-10-CM | POA: Diagnosis present

## 2020-04-07 LAB — GLUCOSE, CAPILLARY: Glucose-Capillary: 91 mg/dL (ref 70–99)

## 2020-04-07 MED ORDER — FLUDEOXYGLUCOSE F - 18 (FDG) INJECTION
8.7000 | Freq: Once | INTRAVENOUS | Status: AC
  Administered 2020-04-07: 8.7 via INTRAVENOUS

## 2020-04-08 ENCOUNTER — Telehealth (HOSPITAL_COMMUNITY): Payer: Self-pay | Admitting: Physical Therapy

## 2020-04-08 ENCOUNTER — Ambulatory Visit (HOSPITAL_COMMUNITY): Payer: Medicare Other | Admitting: Physical Therapy

## 2020-04-08 NOTE — Telephone Encounter (Signed)
No Show. Called patient and left VM about missed appointment and upcoming appointment.    12:01 PM, 04/08/20 [name], DPT Physical Therapy with [facility]  [phone] office

## 2020-04-08 NOTE — Progress Notes (Deleted)
NEUROLOGY CONSULTATION NOTE  [name] MRN: [id number] DOB: 01/28/1951  Referring provider: Cherly Beach, NP Primary care provider: Cherly Beach, NP  Reason for consult:  Memory deficits, dizzy spells, and gait disturbance   Subjective:  Courtney Rogers is a 69 year old ***-handed female with CAD, HTN, HLD, PPM, MGUS, anxiety disorder, sleep apnea and history of PE who presents for memory deficits, dizzy spells, and gait disturbance.  History supplemented by referring provider's note.  She reports memory problems ***  She also reports dizziness, ***.  This has contributed to falls since December 2020.  She says that her legs feel weak, particularly in the heat.  No shortness of breath, lower extremity edema, or numbness in the extremities.  She had *** knee replacement in August, ***.  She also reports increased difficulty with dexterity, having more difficulty moving her fingers which is noticeable when writing, playing games or typing on her computer or phone.  She also reports that voice has been more hoarse.     CT head without contrast performed on 01/03/2020 personally reviewed and showed mild diffuse atrophy and minimal chronic small vessel disease but no acute intracranial abnormality.    B12 from August was 926 and was 935 in October.    PAST MEDICAL HISTORY: Past Medical History:  Diagnosis Date  . Acute myocardial infarction [facility]) 2009   CAD/no stent medically managed  . Anxiety disorder   . CAD (coronary artery disease)   . Hyperlipidemia   . Hypertension   . IBS (irritable bowel syndrome)   . Non-ulcer dyspepsia 04/30/2009   Qualifier: Diagnosis of  By: Oneida Alar MD, Sandi L   . Overactive bladder   . PE (pulmonary embolism) JUN 2014  . Presence of permanent cardiac pacemaker   . Sleep apnea     PAST SURGICAL HISTORY: Past Surgical History:  Procedure Laterality Date  . ABDOMINAL HYSTERECTOMY    . BSO secondary to cyst    . CARDIAC CATHETERIZATION     . COLONOSCOPY  12/2009   Dr. West Carbo, propofol, normal. Next TCS 12/2019  . COLONOSCOPY N/A 05/22/2017   Procedure: COLONOSCOPY;  Surgeon: Danie Binder, MD;  Location[facility];  Service: Endoscopy;  Laterality: N/A;  10:30am  . ESOPHAGOGASTRODUODENOSCOPY  05/11/09   schatzki ring/small hiatal hernia/path:gastritis  . ESOPHAGOGASTRODUODENOSCOPY N/A 05/22/2017   Procedure: ESOPHAGOGASTRODUODENOSCOPY (EGD);  Surgeon: Danie Binder, MD;  Location: [facility];  Service: Endoscopy;  Laterality: N/A;  . ESOPHAGOGASTRODUODENOSCOPY (EGD) WITH ESOPHAGEAL DILATION N/A 04/01/2013   [id number] at the gastroesophageal juction/multiple small polyps/mild gastritis  . GIVENS CAPSULE STUDY N/A 06/07/2017   Procedure: GIVENS CAPSULE STUDY;  Surgeon: Danie Binder, MD;  Location: [facility];  Service: Endoscopy;  Laterality: N/A;  7:30am  . INSERT / REPLACE / REMOVE PACEMAKER     Last year per pt.(can't remember date)    MEDICATIONS: Current Outpatient Medications on File Prior to Visit  Medication Sig Dispense Refill  . acetaminophen (TYLENOL) 500 MG tablet Take 1,000 mg by mouth 2 (two) times daily as needed for moderate pain or headache.    . albuterol (PROVENTIL HFA;VENTOLIN HFA) 108 (90 BASE) MCG/ACT inhaler Inhale 2 puffs into the lungs every 6 (six) hours as needed for shortness of breath.    Marland Kitchen amLODipine (NORVASC) 5 MG tablet Take 5 mg by mouth daily.    Marland Kitchen aspirin EC 81 MG tablet Take 81 mg by mouth at bedtime.    Marland Kitchen b complex vitamins  tablet Take 1 tablet by mouth daily.    . busPIRone (BUSPAR) 5 MG tablet Take 5 mg by mouth 2 (two) times daily.    . Calcium Carbonate-Vitamin D (CALCIUM 600+D PO) Take 1 tablet by mouth 2 (two) times daily.    . chlorthalidone (HYGROTON) 25 MG tablet Take 25 mg by mouth daily.     . Cyanocobalamin (B-12) 2500 MCG TABS Take 1 capsule by mouth daily.    . diclofenac (CATAFLAM) 50 MG tablet Take 50 mg by mouth 2 (two) times daily.    . fluticasone  (FLONASE) 50 MCG/ACT nasal spray SPRAY 2 SPRAYS INTO EACH NOSTRIL ONCE DAILY 16 g 0  . folic acid (FOLVITE) 1 MG tablet TAKE 1 TABLET BY MOUTH ONCE DAILY. 30 tablet 11  . gabapentin (NEURONTIN) 600 MG tablet Take 600 mg by mouth 2 (two) times daily.     Marland Kitchen ipratropium-albuterol (DUONEB) 0.5-2.5 (3) MG/3ML SOLN Supposed to be doing 4 times daily,  She has been doing as needed    . lidocaine (XYLOCAINE) 2 % solution TAKE 2 TEASPOONFULS BEFORE MEALS AND AT BEDTIME AS NEEDED MAY REPEAT EVERY 4 HOURS. (MAX OF 8 DOSES PER DAY) 300 mL 5  . losartan (COZAAR) 50 MG tablet Take 50 mg by mouth 2 (two) times daily.    . metoprolol succinate (TOPROL-XL) 50 MG 24 hr tablet Take 75 mg by mouth daily.     . montelukast (SINGULAIR) 10 MG tablet Take 10 mg by mouth daily.     . nitroGLYCERIN (NITROSTAT) 0.4 MG SL tablet Place 0.4 mg under the tongue every 5 (five) minutes as needed for chest pain.     . potassium chloride (K-DUR,KLOR-CON) 10 MEQ tablet Take 40 mEq by mouth 2 (two) times daily.     . Probiotic Product (PROBIOTIC PO) Take 1 capsule by mouth daily. [facility].    . sertraline (ZOLOFT) 100 MG tablet Take 200 mg by mouth daily.     . Simethicone (GAS-X PO) Take 2 tablets by mouth daily as needed (gas).    . simvastatin (ZOCOR) 40 MG tablet Take 40 mg by mouth at bedtime.      . triamcinolone cream (KENALOG) 0.1 % Apply 1 application topically as needed.     . vitamin C (ASCORBIC ACID) 500 MG tablet Take 500 mg by mouth daily.    Marland Kitchen warfarin (COUMADIN) 2.5 MG tablet Take 2.5 mg by mouth daily.    Marland Kitchen warfarin (COUMADIN) 5 MG tablet Take 5 mg by mouth at bedtime. Mon -Thurs   5 mg and Fri -Sun  2.5 mg     No current facility-administered medications on file prior to visit.    ALLERGIES: Allergies  Allergen Reactions  . Biaxin [Clarithromycin] Other (See Comments)    Stomach problems   . Erythromycin   . Lisinopril Swelling    FAMILY HISTORY: Family History  Problem Relation Age of  Onset  . Colon polyps Neg Hx   . Colon cancer Neg Hx    ***.  SOCIAL HISTORY: Social History   Socioeconomic History  . Marital status: Married    Spouse name: [name]  . Number of children: 3  . Years of education: Not on file  . Highest education level: Not on file  Occupational History  . Not on file  Tobacco Use  . Smoking status: Never Smoker  . Smokeless tobacco: Never Used  . Tobacco comment: Never smoked   Vaping Use  . Vaping Use: Never  used  Substance and Sexual Activity  . Alcohol use: No    Alcohol/week: 0.0 standard drinks  . Drug use: No  . Sexual activity: Not Currently  Other Topics Concern  . Not on file  Social History Narrative   Lives with husband-51 years 10 in Aug 2021   Daughter is close by, 2 sons live further away   One IN [place],ONE IN [place], [place].        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   Enjoys: reading, young adult      Diet: eats all food groups    Caffeine: coffee 1, tea daily soda-1 daily   Water: 1-2 cups      Wears seat belt    Does not use phone while driving   Oceanographer at home    Public house manager -locked up   Social Determinants of Health   Financial Resource Strain: Low Risk   . Difficulty of Paying Living Expenses: Not hard at all  Food Insecurity: No Food Insecurity  . Worried About Charity fundraiser in the Last Year: Never true  . Ran Out of Food in the Last Year: Never true  Transportation Needs: No Transportation Needs  . Lack of Transportation (Medical): No  . Lack of Transportation (Non-Medical): No  Physical Activity: Inactive  . Days of Exercise per Week: 0 days  . Minutes of Exercise per Session: 0 min  Stress: No Stress Concern Present  . Feeling of Stress : Not at all  Social Connections: Moderately Integrated  . Frequency of Communication with Friends and Family: More than three times a week  . Frequency of Social Gatherings with Friends and Family: More  than three times a week  . Attends Religious Services: More than 4 times per year  . Active Member of Clubs or Organizations: No  . Attends Archivist Meetings: Never  . Marital Status: Married  Human resources officer Violence: Not At Risk  . Fear of Current or Ex-Partner: No  . Emotionally Abused: No  . Physically Abused: No  . Sexually Abused: No    Objective:  *** General: No acute distress.  Patient appears well-groomed.   Head:  Normocephalic/atraumatic Eyes:  fundi examined but not visualized Neck: supple, no paraspinal tenderness, full range of motion Back: No paraspinal tenderness Heart: regular rate and rhythm Lungs: Clear to auscultation bilaterally. Vascular: No carotid bruits. Neurological Exam: Mental status: alert and oriented to person, place, and time, recent and remote memory intact, fund of knowledge intact, attention and concentration intact, speech fluent and not dysarthric, language intact. Cranial nerves: CN I: not tested CN II: pupils equal, round and reactive to light, visual fields intact CN III, IV, VI:  full range of motion, no nystagmus, no ptosis CN V: facial sensation intact. CN VII: upper and lower face symmetric CN VIII: hearing intact CN IX, X: gag intact, uvula midline CN XI: sternocleidomastoid and trapezius muscles intact CN XII: tongue midline Bulk & Tone: normal, no fasciculations. Motor:  muscle strength 5/5 throughout Sensation:  Pinprick, temperature and vibratory sensation intact. Deep Tendon Reflexes:  2+ throughout,  toes downgoing.   Finger to nose testing:  Without dysmetria.   Heel to shin:  Without dysmetria.   Gait:  Normal station and stride.  Romberg negative.  Assessment/Plan:   ***    Thank you for allowing me to take part in the care of this patient.  Metta Clines, DO  CC: ***

## 2020-04-09 ENCOUNTER — Ambulatory Visit: Payer: Medicare Other | Admitting: Neurology

## 2020-04-14 ENCOUNTER — Inpatient Hospital Stay (HOSPITAL_COMMUNITY): Payer: Medicare Other | Attending: Hematology | Admitting: Hematology

## 2020-04-14 ENCOUNTER — Other Ambulatory Visit: Payer: Self-pay

## 2020-04-14 ENCOUNTER — Telehealth (HOSPITAL_COMMUNITY): Payer: Self-pay | Admitting: Physical Therapy

## 2020-04-14 ENCOUNTER — Ambulatory Visit (HOSPITAL_COMMUNITY): Payer: Medicare Other | Admitting: Physical Therapy

## 2020-04-14 VITALS — BP 149/78 | HR 84 | Resp 18 | Wt 165.2 lb

## 2020-04-14 DIAGNOSIS — D649 Anemia, unspecified: Secondary | ICD-10-CM | POA: Insufficient documentation

## 2020-04-14 DIAGNOSIS — Z86711 Personal history of pulmonary embolism: Secondary | ICD-10-CM | POA: Insufficient documentation

## 2020-04-14 DIAGNOSIS — Z79899 Other long term (current) drug therapy: Secondary | ICD-10-CM | POA: Diagnosis not present

## 2020-04-14 DIAGNOSIS — C9000 Multiple myeloma not having achieved remission: Secondary | ICD-10-CM | POA: Insufficient documentation

## 2020-04-14 DIAGNOSIS — D472 Monoclonal gammopathy: Secondary | ICD-10-CM

## 2020-04-14 DIAGNOSIS — Z7901 Long term (current) use of anticoagulants: Secondary | ICD-10-CM | POA: Insufficient documentation

## 2020-04-14 NOTE — Progress Notes (Signed)
Courtney Rogers, [place] [address]   CLINIC:  Medical Oncology/Hematology  PCP:  Perlie Mayo, NP [place] / [place] [address]  [phone]  REASON FOR VISIT:  Follow-up for smoldering myeloma  PRIOR THERAPY: None  CURRENT THERAPY: Under work-up  INTERVAL HISTORY:  Courtney Rogers, a 69 y.o. female, returns for routine follow-up for her smoldering myeloma. Courtney Rogers was last seen on 03/11/2020.  Today she reports feeling okay. She denies having any new bone pains. She continues having pain in her right flank and is wearing the back brace.   REVIEW OF SYSTEMS:  Review of Systems  Constitutional: Positive for appetite change (50%) and fatigue (50%).  Gastrointestinal: Positive for abdominal pain (4/10 R flank pain) and constipation.  Musculoskeletal: Negative for arthralgias.  Neurological: Positive for numbness (legs).  All other systems reviewed and are negative.   PAST MEDICAL/SURGICAL HISTORY:  Past Medical History:  Diagnosis Date  . Acute myocardial infarction [facility]) 2009   CAD/no stent medically managed  . Anxiety disorder   . CAD (coronary artery disease)   . Hyperlipidemia   . Hypertension   . IBS (irritable bowel syndrome)   . Non-ulcer dyspepsia 04/30/2009   Qualifier: Diagnosis of  By: Oneida Alar MD, Sandi L   . Overactive bladder   . PE (pulmonary embolism) JUN 2014  . Presence of permanent cardiac pacemaker   . Sleep apnea    Past Surgical History:  Procedure Laterality Date  . ABDOMINAL HYSTERECTOMY    . BSO secondary to cyst    . CARDIAC CATHETERIZATION    . COLONOSCOPY  12/2009   Dr. West Carbo, propofol, normal. Next TCS 12/2019  . COLONOSCOPY N/A 05/22/2017   Procedure: COLONOSCOPY;  Surgeon: Danie Binder, MD;  [facility];  Service: Endoscopy;  Laterality: N/A;  10:30am  . ESOPHAGOGASTRODUODENOSCOPY  05/11/09   schatzki ring/small hiatal hernia/path:gastritis  .  ESOPHAGOGASTRODUODENOSCOPY N/A 05/22/2017   Procedure: ESOPHAGOGASTRODUODENOSCOPY (EGD);  Surgeon: Danie Binder, MD;  [facility];  Service: Endoscopy;  Laterality: N/A;  . ESOPHAGOGASTRODUODENOSCOPY (EGD) WITH ESOPHAGEAL DILATION N/A 04/01/2013   [id number] at the gastroesophageal juction/multiple small polyps/mild gastritis  . GIVENS CAPSULE STUDY N/A 06/07/2017   Procedure: GIVENS CAPSULE STUDY;  Surgeon: Danie Binder, MD;  [facility];  Service: Endoscopy;  Laterality: N/A;  7:30am  . INSERT / REPLACE / REMOVE PACEMAKER     Last year per pt.(can't remember date)    SOCIAL HISTORY:  Social History   Socioeconomic History  . Marital status: Married    Spouse name: [name]  . Number of children: 3  . Years of education: Not on file  . Highest education level: Not on file  Occupational History  . Not on file  Tobacco Use  . Smoking status: Never Smoker  . Smokeless tobacco: Never Used  . Tobacco comment: Never smoked   Vaping Use  . Vaping Use: Never used  Substance and Sexual Activity  . Alcohol use: No    Alcohol/week: 0.0 standard drinks  . Drug use: No  . Sexual activity: Not Currently  Other Topics Concern  . Not on file  Social History Narrative   Lives with husband-51 years 82 in Aug 2021   Daughter is close by, 2 sons live further away   One IN [place],ONE IN [place], [place].        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  Enjoys: reading, young adult      Diet: eats all food groups    Caffeine: coffee 1, tea daily soda-1 daily   Water: 1-2 cups      Wears seat belt    Does not use phone while driving   Oceanographer at home    Public house manager -locked up   Social Determinants of Health   Financial Resource Strain: Low Risk   . Difficulty of Paying Living Expenses: Not hard at all  Food Insecurity: No Food Insecurity  . Worried About Charity fundraiser in the Last Year: Never true  . Ran  Out of Food in the Last Year: Never true  Transportation Needs: No Transportation Needs  . Lack of Transportation (Medical): No  . Lack of Transportation (Non-Medical): No  Physical Activity: Inactive  . Days of Exercise per Week: 0 days  . Minutes of Exercise per Session: 0 min  Stress: No Stress Concern Present  . Feeling of Stress : Not at all  Social Connections: Moderately Integrated  . Frequency of Communication with Friends and Family: More than three times a week  . Frequency of Social Gatherings with Friends and Family: More than three times a week  . Attends Religious Services: More than 4 times per year  . Active Member of Clubs or Organizations: No  . Attends Archivist Meetings: Never  . Marital Status: Married  Human resources officer Violence: Not At Risk  . Fear of Current or Ex-Partner: No  . Emotionally Abused: No  . Physically Abused: No  . Sexually Abused: No    FAMILY HISTORY:  Family History  Problem Relation Age of Onset  . Colon polyps Neg Hx   . Colon cancer Neg Hx     CURRENT MEDICATIONS:  Current Outpatient Medications  Medication Sig Dispense Refill  . acetaminophen (TYLENOL) 500 MG tablet Take 1,000 mg by mouth 2 (two) times daily as needed for moderate pain or headache.    . albuterol (PROVENTIL HFA;VENTOLIN HFA) 108 (90 BASE) MCG/ACT inhaler Inhale 2 puffs into the lungs every 6 (six) hours as needed for shortness of breath.    Marland Kitchen amLODipine (NORVASC) 5 MG tablet Take 5 mg by mouth daily.    Marland Kitchen aspirin EC 81 MG tablet Take 81 mg by mouth at bedtime.    Marland Kitchen b complex vitamins tablet Take 1 tablet by mouth daily.    . busPIRone (BUSPAR) 5 MG tablet Take 5 mg by mouth 2 (two) times daily.    . Calcium Carbonate-Vitamin D (CALCIUM 600+D PO) Take 1 tablet by mouth 2 (two) times daily.    . chlorthalidone (HYGROTON) 25 MG tablet Take 25 mg by mouth daily.     . Cyanocobalamin (B-12) 2500 MCG TABS Take 1 capsule by mouth daily.    . diclofenac  (CATAFLAM) 50 MG tablet Take 50 mg by mouth 2 (two) times daily.    . fluticasone (FLONASE) 50 MCG/ACT nasal spray SPRAY 2 SPRAYS INTO EACH NOSTRIL ONCE DAILY 16 g 0  . folic acid (FOLVITE) 1 MG tablet TAKE 1 TABLET BY MOUTH ONCE DAILY. 30 tablet 11  . gabapentin (NEURONTIN) 600 MG tablet Take 600 mg by mouth 2 (two) times daily.     Marland Kitchen ipratropium-albuterol (DUONEB) 0.5-2.5 (3) MG/3ML SOLN Supposed to be doing 4 times daily,  She has been doing as needed    . lidocaine (XYLOCAINE) 2 % solution TAKE 2 TEASPOONFULS BEFORE MEALS AND AT BEDTIME AS  NEEDED MAY REPEAT EVERY 4 HOURS. (MAX OF 8 DOSES PER DAY) 300 mL 5  . losartan (COZAAR) 50 MG tablet Take 50 mg by mouth 2 (two) times daily.    . metoprolol succinate (TOPROL-XL) 50 MG 24 hr tablet Take 75 mg by mouth daily.     . montelukast (SINGULAIR) 10 MG tablet Take 10 mg by mouth daily.     . nitroGLYCERIN (NITROSTAT) 0.4 MG SL tablet Place 0.4 mg under the tongue every 5 (five) minutes as needed for chest pain.     . potassium chloride (K-DUR,KLOR-CON) 10 MEQ tablet Take 40 mEq by mouth 2 (two) times daily.     . Probiotic Product (PROBIOTIC PO) Take 1 capsule by mouth daily. [facility].    . sertraline (ZOLOFT) 100 MG tablet Take 200 mg by mouth daily.     . Simethicone (GAS-X PO) Take 2 tablets by mouth daily as needed (gas).    . simvastatin (ZOCOR) 40 MG tablet Take 40 mg by mouth at bedtime.      . triamcinolone cream (KENALOG) 0.1 % Apply 1 application topically as needed.     . vitamin C (ASCORBIC ACID) 500 MG tablet Take 500 mg by mouth daily.    Marland Kitchen warfarin (COUMADIN) 2.5 MG tablet Take 2.5 mg by mouth daily.    Marland Kitchen warfarin (COUMADIN) 5 MG tablet Take 5 mg by mouth at bedtime. Mon -Thurs   5 mg and Fri -Sun  2.5 mg     No current facility-administered medications for this visit.    ALLERGIES:  Allergies  Allergen Reactions  . Biaxin [Clarithromycin] Other (See Comments)    Stomach problems   . Erythromycin   . Lisinopril  Swelling    PHYSICAL EXAM:  Performance status (ECOG): 1 - Symptomatic but completely ambulatory  Vitals:   04/14/20 1516  BP: (!) 149/78  Pulse: 84  Resp: 18  SpO2: 97%   Wt Readings from Last 3 Encounters:  04/14/20 165 lb 3.2 oz (74.9 kg)  04/02/20 170 lb (77.1 kg)  03/17/20 155 lb 3.2 oz (70.4 kg)   Physical Exam Vitals reviewed.  Constitutional:      Appearance: Normal appearance.     Comments: Back brace  Neurological:     General: No focal deficit present.     Mental Status: She is alert and oriented to person, place, and time.  Psychiatric:        Mood and Affect: Mood normal.        Behavior: Behavior normal.     LABORATORY DATA:  I have reviewed the labs as listed.  CBC Latest Ref Rng & Units 04/02/2020 03/04/2020 01/07/2020  WBC 4.0 - 10.5 K/uL 6.1 6.4 8.2  Hemoglobin 12.0 - 15.0 g/dL 10.4(L) 10.3(L) 10.9(L)  Hematocrit 36 - 46 % 33.0(L) 32.4(L) 34.3(L)  Platelets 150 - 400 K/uL 103(L) 126(L) 105(L)   CMP Latest Ref Rng & Units 04/02/2020 03/04/2020 01/13/2020  Glucose 70 - 99 mg/dL 92 104(H) 103(H)  BUN 8 - 23 mg/dL 12 12 28(H)  Creatinine 0.44 - 1.00 mg/dL 0.79 0.81 1.06(H)  Sodium 135 - 145 mmol/L 138 139 136  Potassium 3.5 - 5.1 mmol/L 3.7 4.0 4.3  Chloride 98 - 111 mmol/L 98 101 100  CO2 22 - 32 mmol/L _0 Calcium 8.9 - 10.3 mg/dL 9.4 9.4 9.7  Total Protein 6.5 - 8.1 g/dL - 9.7(H) -  Total Bilirubin 0.3 - 1.2 mg/dL - 0.7 -  Alkaline  Phos 38 - 126 U/L - 65 -  AST 15 - 41 U/L - 32 -  ALT 0 - 44 U/L - 21 -      Component Value Date/Time   RBC 3.27 (L) 04/02/2020 1046   MCV 100.9 (H) 04/02/2020 1046   MCH 31.8 04/02/2020 1046   MCHC 31.5 04/02/2020 1046   RDW 15.8 (H) 04/02/2020 1046   LYMPHSABS 1.3 04/02/2020 1046   MONOABS 0.6 04/02/2020 1046   EOSABS 0.1 04/02/2020 1046   BASOSABS 0.0 04/02/2020 1046    DIAGNOSTIC IMAGING:  I have independently reviewed the scans and discussed with the patient. NM PET Image Restag (PS) Skull  Base To Thigh  Result Date: 04/07/2020 CLINICAL DATA:  Subsequent treatment strategy for multiple gammopathy of undetermined significance. EXAM: NUCLEAR MEDICINE PET SKULL BASE TO THIGH TECHNIQUE: 8.2 mCi F-18 FDG was injected intravenously. Full-ring PET imaging was performed from the skull base to thigh after the radiotracer. CT data was obtained and used for attenuation correction and anatomic localization. Fasting blood glucose: 91 mg/dl COMPARISON:  PET-CT scan 07/08/2019 FINDINGS: Mediastinal blood pool activity: SUV max 3.1 Liver activity: SUV max NA NECK: No hypermetabolic lymph nodes in the neck. Incidental CT findings: none CHEST: No hypermetabolic mediastinal or hilar nodes. No suspicious pulmonary nodules on the CT scan. Incidental CT findings: LEFT-sided pacemaker. ABDOMEN/PELVIS: No abnormal hypermetabolic activity within the liver, pancreas, adrenal glands, or spleen. No hypermetabolic lymph nodes in the abdomen or pelvis. Incidental CT findings: none SKELETON: No focal activity to suggest active multiple myeloma. Lytic lesion on CT portion exam. Incidental CT findings: none IMPRESSION: 1. No evidence active multiple myeloma on FDG PET scan. 2. No evidence of lytic lesion on CT portion exam. 3. No soft tissue plasmacytoma Electronically Signed   By: Suzy Bouchard M.D.   On: 04/07/2020 15:52   CT Renal Stone Study  Result Date: 04/02/2020 CLINICAL DATA:  Right flank pain. EXAM: CT ABDOMEN AND PELVIS WITHOUT CONTRAST TECHNIQUE: Multidetector CT imaging of the abdomen and pelvis was performed following the standard protocol without IV contrast. COMPARISON:  January 07, 2020. FINDINGS: Lower chest: No acute abnormality. Hepatobiliary: No focal liver abnormality is seen. No gallstones, gallbladder wall thickening, or biliary dilatation. Pancreas: Unremarkable. No pancreatic ductal dilatation or surrounding inflammatory changes. Spleen: Normal in size without focal abnormality. Adrenals/Urinary  Tract: Adrenal glands are unremarkable. Kidneys are normal, without renal calculi, focal lesion, or hydronephrosis. Bladder is unremarkable. Stomach/Bowel: The stomach appears normal. There is no evidence of bowel obstruction or inflammation. Diverticulosis of descending and sigmoid colon is noted without inflammation. The appendix is not visualized. Vascular/Lymphatic: No significant vascular findings are present. No enlarged abdominal or pelvic lymph nodes. Reproductive: Status post hysterectomy. No adnexal masses. Other: No abdominal wall hernia or abnormality. No abdominopelvic ascites. Musculoskeletal: No acute or significant osseous findings. IMPRESSION: 1. Diverticulosis of descending and sigmoid colon without inflammation. 2. No acute abnormality seen in the abdomen or pelvis. Electronically Signed   By: Marijo Conception M.D.   On: 04/02/2020 10:50     ASSESSMENT:  1. IgA kappa smoldering myeloma: -BMBX on 07/09/2019 shows hypercellular marrow for age with trilineage hematopoiesis. Increased number of atypical plasma cells present 36% of all cell lines. Plasma cells are kappa light chain restricted. -Unfortunately her specimen has reached cytogenetics and FISH lab week later due to bad weather and no viable plasma cells. -PET scan on 07/09/2019 showed no findings of active myeloma. -24-hour urine shows total protein 59 mg.  Urine immunofixation shows IgA kappa monoclonal protein. LDH is 184. -Based on [facility] criteria she has high risk with about 45-50% probability of progression to myeloma in the next 2 years. -CTAP on 01/07/2020 shows acute superior endplate burst fracture of L1.  Mild associated paravertebral soft tissue thickening.  No other bone abnormalities. -Bone density test on 02/07/2020 shows T score -0.5.   PLAN:  1. IgA kappa smoldering myeloma: -She recently had a fracture of the L1 and is wearing a brace. Bone density on 02/07/2020 shows T score -0.5. -Recent myeloma labs  on 03/04/2020 showed worsening. -We reviewed PET CT scan from 04/07/2020 with no evidence of active multiple myeloma on PET scan. No evidence of lytic infection on the CT portion. No evidence of soft tissue plasmacytoma. -Recommend follow-up labs in 3 to 4 months.  2.  Normocytic anemia: -Most recent hemoglobin is 10.4 with MCV of 100.9. Ferritin was 510 and percent saturation 24. O83 and folic acid was normal. We'll closely monitor.  3. Pulmonary embolism: -Pulmonary embolism in 2015 and is on Coumadin since then.   Orders placed this encounter:  Orders Placed This Encounter  Procedures  . CBC with Differential/Platelet  . Comprehensive metabolic panel  . Kappa/lambda light chains  . Protein electrophoresis, serum  . Ferritin  . Iron and TIBC     Derek Jack, MD [place] [phone]   I, Milinda Antis, am acting as a scribe for Dr. Sanda Linger.  I, Derek Jack MD, have reviewed the above documentation for accuracy and completeness, and I agree with the above.

## 2020-04-14 NOTE — Telephone Encounter (Signed)
No Show. Called and left VM about missed apt and upcoming apt.   1:35 PM, 04/14/20 [name], DPT Physical Therapy with [facility]  [phone] office

## 2020-04-14 NOTE — Patient Instructions (Signed)
[facility] °Discharge Instructions ° °You were seen today by Dr. Katragadda. He went over your recent results. Dr. Katragadda will see you back in 4 months for labs and follow up. ° ° °Thank you for choosing [facility] at [facility] to provide your oncology and hematology care.  To afford each patient quality time with our provider, please arrive at least 15 minutes before your scheduled appointment time.  ° °If you have a lab appointment with the Cancer Center please come in thru the Main Entrance and check in at the main information desk ° °You need to re-schedule your appointment should you arrive 10 or more minutes late.  We strive to give you quality time with our providers, and arriving late affects you and other patients whose appointments are after yours.  Also, if you no show three or more times for appointments you may be dismissed from the clinic at the providers discretion.     °Again, thank you for choosing [facility].  Our hope is that these requests will decrease the amount of time that you wait before being seen by our physicians.       °_____________________________________________________________ ° °Should you have questions after your visit to [facility], please contact our office at [phone] between the hours of 8:00 a.m. and 4:30 p.m.  Voicemails left after 4:00 p.m. will not be returned until the following business day.  For prescription refill requests, have your pharmacy contact our office and allow 72 hours.   ° °Cancer Center Support Programs:  ° °> Cancer Support Group  °2nd Tuesday of the month 1pm-2pm, Journey Room  ° ° °

## 2020-04-22 ENCOUNTER — Ambulatory Visit (HOSPITAL_COMMUNITY): Payer: Medicare Other | Attending: Family Medicine

## 2020-04-22 ENCOUNTER — Other Ambulatory Visit: Payer: Self-pay

## 2020-04-22 ENCOUNTER — Encounter (HOSPITAL_COMMUNITY): Payer: Self-pay

## 2020-04-22 DIAGNOSIS — M6281 Muscle weakness (generalized): Secondary | ICD-10-CM | POA: Diagnosis present

## 2020-04-22 DIAGNOSIS — Z9181 History of falling: Secondary | ICD-10-CM | POA: Diagnosis present

## 2020-04-22 DIAGNOSIS — R262 Difficulty in walking, not elsewhere classified: Secondary | ICD-10-CM | POA: Insufficient documentation

## 2020-04-22 NOTE — Therapy (Signed)
[place], [place], [address] Phone: [phone]   Fax:  [phone]  Physical Therapy Treatment  Patient Details  Name: Courtney Rogers MRN: [id number] Date of Birth: 1950/05/29 Referring Provider (PT): Cherly Beach NP   Encounter Date: 04/22/2020   PT End of Session - 04/22/20 1509    Visit Number 3    Number of Visits 8    Date for PT Re-Evaluation 05/20/20    Authorization Type BCBS medicare primary, BCBS secondary, 40 VL, eval needs to be sent off    Progress Note Due on Visit 10    PT Start Time 1458    PT Stop Time 1530    PT Time Calculation (min) 32 min    Equipment Utilized During Treatment Gait belt    Activity Tolerance Patient tolerated treatment well;Patient limited by fatigue;No increased pain    Behavior During Therapy WFL for tasks assessed/performed           Past Medical History:  Diagnosis Date  . Acute myocardial infarction [facility]) 2009   CAD/no stent medically managed  . Anxiety disorder   . CAD (coronary artery disease)   . Hyperlipidemia   . Hypertension   . IBS (irritable bowel syndrome)   . Non-ulcer dyspepsia 04/30/2009   Qualifier: Diagnosis of  By: Oneida Alar MD, Sandi L   . Overactive bladder   . PE (pulmonary embolism) JUN 2014  . Presence of permanent cardiac pacemaker   . Sleep apnea     Past Surgical History:  Procedure Laterality Date  . ABDOMINAL HYSTERECTOMY    . BSO secondary to cyst    . CARDIAC CATHETERIZATION    . COLONOSCOPY  12/2009   Dr. West Carbo, propofol, normal. Next TCS 12/2019  . COLONOSCOPY N/A 05/22/2017   Procedure: COLONOSCOPY;  Surgeon: Danie Binder, MD;  [facility];  Service: Endoscopy;  Laterality: N/A;  10:30am  . ESOPHAGOGASTRODUODENOSCOPY  05/11/09   schatzki ring/small hiatal hernia/path:gastritis  . ESOPHAGOGASTRODUODENOSCOPY N/A 05/22/2017   Procedure: ESOPHAGOGASTRODUODENOSCOPY (EGD);  Surgeon: Danie Binder, MD;   [facility];  Service: Endoscopy;  Laterality: N/A;  . ESOPHAGOGASTRODUODENOSCOPY (EGD) WITH ESOPHAGEAL DILATION N/A 04/01/2013   [id number] at the gastroesophageal juction/multiple small polyps/mild gastritis  . GIVENS CAPSULE STUDY N/A 06/07/2017   Procedure: GIVENS CAPSULE STUDY;  Surgeon: Danie Binder, MD;  [facility];  Service: Endoscopy;  Laterality: N/A;  7:30am  . INSERT / REPLACE / REMOVE PACEMAKER     Last year per pt.(can't remember date)    There were no vitals filed for this visit.   Subjective Assessment - 04/22/20 1505    Subjective Pt arrived with high RW, reports she feels it helps to keep her posture up.  Reports intermittent pain with Rt knee, has immobilizer on it today.  Pain scale 4/10 LBP in center.  Has began some of the exercises daily    Currently in Pain? Yes    Pain Score 4     Pain Location Back    Pain Orientation Lower    Pain Descriptors / Indicators Aching;Sore    Pain Type Acute pain    Pain Onset More than a month ago    Pain Frequency Intermittent    Aggravating Factors  with movement                             [facility] Adult  PT Treatment/Exercise - 04/22/20 0001      Ambulation/Gait   Ambulation/Gait Yes    Ambulation Distance (Feet) 200 Feet    Assistive device Straight cane    Gait Pattern Step-through pattern;Decreased arm swing - right;Decreased arm swing - left;Decreased step length - right;Decreased step length - left;Decreased hip/knee flexion - left;Decreased hip/knee flexion - right    Gait velocity decreased    Gait Comments verbal cueing for sequence, equal stride length and posture      Exercises   Exercises Knee/Hip      Knee/Hip Exercises: Standing   Heel Raises 10 reps    Knee Flexion 10 reps    Hip Flexion 10 reps;Knee bent    Hip Flexion Limitations alternating march HHA    Hip Abduction Both;10 reps;Knee straight    Gait Training 200 ft SPC cueing for sequence    Other  Standing Knee Exercises wall arch (therapist tactile and verbal cueing required.)    Other Standing Knee Exercises scapular retraction 10x      Knee/Hip Exercises: Seated   Sit to Sand 10 reps;without UE support                    PT Short Term Goals - 04/01/20 1539      PT SHORT TERM GOAL #1   Title Patient will be independent in self management strategies to improve quality of life and functional outcomes.    Time 3    Period Weeks    Status On-going    Target Date 04/15/20      PT SHORT TERM GOAL #2   Title Patient will report at least 25% improvement in overall symptoms and/or function to demonstrate improved functional mobility    Time 3    Period Weeks    Status On-going    Target Date 04/15/20             PT Long Term Goals - 04/01/20 1540      PT LONG TERM GOAL #1   Title Patient will be able to perform 5x sit to stand without use of upper extremiteis in less then 18 seconds to demonstrate improved functional strength.    Time 8    Period Weeks    Status On-going      PT LONG TERM GOAL #2   Title Patient will be able to ambulate at least 226 feet in 2 minutes with required assistive device to demonstrate improved ambulatory mobility.    Time 8    Period Weeks    Status On-going      PT LONG TERM GOAL #3   Title Patient will be able to stand on either leg for at least 20 seconds while looking straight ahead with no upper extremity assist to improve static balance.    Time 8    Period Weeks    Status On-going                 Plan - 04/22/20 1539    Clinical Impression Statement Progressed to standing exercises for LE strengthening, HHA and SBA required for safety and required cueing for posture and proper sequence through session.  Gait training with SPC, pt ambulated NBOS and short step length.  Cueing to improve sequence with cane, posture and equalize stride length for more normalized gait.  2 LOB episodes with min A required on turns.  No  reports of pain through session, was limited by fatigue.    Personal Factors and  Comorbidities Comorbidity 2;Comorbidity 1;Comorbidity 3+    Comorbidities history of falls, chronic right knee pain, compression fracture in spine, HTN    Examination-Activity Limitations Locomotion Level;Transfers;Stand;Bend    Examination-Participation Restrictions Cleaning;Meal Prep;Shop    Stability/Clinical Decision Making Stable/Uncomplicated    Clinical Decision Making Low    Rehab Potential Fair    PT Frequency 1x / week    PT Duration 8 weeks    PT Treatment/Interventions ADLs/Self Care Home Management;Aquatic Therapy;Cryotherapy;Balance training;Therapeutic exercise;Therapeutic activities;Stair training;Functional mobility training;Gait training;DME Instruction;Neuromuscular re-education;Patient/family education;Manual techniques    PT Next Visit Plan functional strength, static and dynamic balance.  Progress to theraband postural strenghtening, tandem stance and sidestep    PT Home Exercise Plan 11/10:  bridge, seated LAQ, marching, sit to stands           Patient will benefit from skilled therapeutic intervention in order to improve the following deficits and impairments:     Visit Diagnosis: Muscle weakness (generalized)  History of falling  Difficulty in walking, not elsewhere classified     Problem List Patient Active Problem List   Diagnosis Date Noted  . Trapezius muscle spasm 02/27/2020  . DDD (degenerative disc disease), cervical 01/02/2020  . Hoarseness of voice 11/27/2019  . 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  . Essential hypertension 12/14/2017  . Iron deficiency anemia 08/03/2017  . Dyspepsia   . Back pain 04/19/2017  . Normocytic anemia 01/12/2017  . Fatty liver 11/03/2015  . Anxiety state 11/03/2015  . Weight loss 04/05/2011  . GERD 06/01/2010  . IRRITABLE BOWEL SYNDROME 07/21/2009   Ihor Austin, LPTA/CLT; CBIS [phone]  Aldona Lento 04/22/2020, 3:47  PM  [facility] [address] [place], [place], [address] Phone: [phone]   Fax:  [phone]  Name: Courtney Rogers MRN: [id number] Date of Birth: 04/28/1951

## 2020-04-23 ENCOUNTER — Encounter: Payer: Self-pay | Admitting: Nurse Practitioner

## 2020-04-29 ENCOUNTER — Ambulatory Visit (HOSPITAL_COMMUNITY): Payer: Medicare Other | Admitting: Physical Therapy

## 2020-04-29 ENCOUNTER — Encounter (HOSPITAL_COMMUNITY): Payer: Self-pay | Admitting: Physical Therapy

## 2020-04-29 ENCOUNTER — Other Ambulatory Visit: Payer: Self-pay

## 2020-04-29 DIAGNOSIS — M6281 Muscle weakness (generalized): Secondary | ICD-10-CM | POA: Diagnosis not present

## 2020-04-29 DIAGNOSIS — R262 Difficulty in walking, not elsewhere classified: Secondary | ICD-10-CM

## 2020-04-29 DIAGNOSIS — Z9181 History of falling: Secondary | ICD-10-CM

## 2020-04-29 NOTE — Therapy (Signed)
[place], [place], [address] Phone: [phone]   Fax:  [phone]  Physical Therapy Treatment  Patient Details  Name: Courtney Rogers MRN: [id number] Date of Birth: 1950/07/16 Referring Provider (PT): Cherly Beach NP   Encounter Date: 04/29/2020   PT End of Session - 04/29/20 1051    Visit Number 4    Number of Visits 8    Date for PT Re-Evaluation 05/20/20    Authorization Type BCBS medicare primary, BCBS secondary, 40 VL, eval needs to be sent off    Progress Note Due on Visit 10    PT Start Time 1050    PT Stop Time 1128    PT Time Calculation (min) 38 min    Equipment Utilized During Treatment Gait belt    Activity Tolerance Patient tolerated treatment well;Patient limited by fatigue;No increased pain    Behavior During Therapy WFL for tasks assessed/performed           Past Medical History:  Diagnosis Date  . Acute myocardial infarction [facility]) 2009   CAD/no stent medically managed  . Anxiety disorder   . CAD (coronary artery disease)   . Hyperlipidemia   . Hypertension   . IBS (irritable bowel syndrome)   . Non-ulcer dyspepsia 04/30/2009   Qualifier: Diagnosis of  By: Oneida Alar MD, Sandi L   . Overactive bladder   . PE (pulmonary embolism) JUN 2014  . Presence of permanent cardiac pacemaker   . Sleep apnea     Past Surgical History:  Procedure Laterality Date  . ABDOMINAL HYSTERECTOMY    . BSO secondary to cyst    . CARDIAC CATHETERIZATION    . COLONOSCOPY  12/2009   Dr. West Carbo, propofol, normal. Next TCS 12/2019  . COLONOSCOPY N/A 05/22/2017   Procedure: COLONOSCOPY;  Surgeon: Danie Binder, MD;  [facility];  Service: Endoscopy;  Laterality: N/A;  10:30am  . ESOPHAGOGASTRODUODENOSCOPY  05/11/09   schatzki ring/small hiatal hernia/path:gastritis  . ESOPHAGOGASTRODUODENOSCOPY N/A 05/22/2017   Procedure: ESOPHAGOGASTRODUODENOSCOPY (EGD);  Surgeon: Danie Binder, MD;   [facility];  Service: Endoscopy;  Laterality: N/A;  . ESOPHAGOGASTRODUODENOSCOPY (EGD) WITH ESOPHAGEAL DILATION N/A 04/01/2013   [id number] at the gastroesophageal juction/multiple small polyps/mild gastritis  . GIVENS CAPSULE STUDY N/A 06/07/2017   Procedure: GIVENS CAPSULE STUDY;  Surgeon: Danie Binder, MD;  [facility];  Service: Endoscopy;  Laterality: N/A;  7:30am  . INSERT / REPLACE / REMOVE PACEMAKER     Last year per pt.(can't remember date)    There were no vitals filed for this visit.   Subjective Assessment - 04/29/20 1050    Subjective Patient arrives with small-base cane/walking stick and demonstrates unsteadiness in gait    Currently in Pain? No/denies    Pain Score 0-No pain    Pain Onset More than a month ago              [facility] PT Assessment - 04/29/20 0001      Assessment   Medical Diagnosis right upper trapezius spasm, fall     Referring Provider (PT) Cherly Beach NP                         [facility] Adult PT Treatment/Exercise - 04/29/20 0001      Ambulation/Gait   Ambulation/Gait Yes    Ambulation/Gait Assistance 4: Min guard    Ambulation Distance (Feet) 200 Feet  Assistive device Straight cane    Gait Pattern Step-through pattern;Decreased arm swing - right;Decreased arm swing - left;Decreased step length - right;Decreased step length - left;Decreased hip/knee flexion - left;Decreased hip/knee flexion - right   head down   Ambulation Surface Level    Gait velocity decreased      Neuro Re-ed    Neuro Re-ed Details  static standing balance   single leg stance 3x20 sec, tandem stance 3x20 sec     Knee/Hip Exercises: Standing   Hip Extension Stengthening;3 sets;10 reps      Knee/Hip Exercises: Seated   Abduction/Adduction  3 sets;10 reps    Sit to Sand 10 reps;without UE support                  PT Education - 04/29/20 1122    Education Details Patient educated on benefits of using RW at this  time vs cane due to balance deficits leading to high risk for falls    Person(s) Educated Patient    Methods Explanation    Comprehension Verbalized understanding            PT Short Term Goals - 04/01/20 1539      PT SHORT TERM GOAL #1   Title Patient will be independent in self management strategies to improve quality of life and functional outcomes.    Time 3    Period Weeks    Status On-going    Target Date 04/15/20      PT SHORT TERM GOAL #2   Title Patient will report at least 25% improvement in overall symptoms and/or function to demonstrate improved functional mobility    Time 3    Period Weeks    Status On-going    Target Date 04/15/20             PT Long Term Goals - 04/01/20 1540      PT LONG TERM GOAL #1   Title Patient will be able to perform 5x sit to stand without use of upper extremiteis in less then 18 seconds to demonstrate improved functional strength.    Time 8    Period Weeks    Status On-going      PT LONG TERM GOAL #2   Title Patient will be able to ambulate at least 226 feet in 2 minutes with required assistive device to demonstrate improved ambulatory mobility.    Time 8    Period Weeks    Status On-going      PT LONG TERM GOAL #3   Title Patient will be able to stand on either leg for at least 20 seconds while looking straight ahead with no upper extremity assist to improve static balance.    Time 8    Period Weeks    Status On-going                 Plan - 04/29/20 1052    Clinical Impression Statement Progressing with activities in standing position with emphasis on facilitating righting reactions and challenge to perform tasks with narrow BOS to reduce risk for falls during ADL/mobility.  Continues to exhibit unsteadiness on feet and experienced multiple episodes of LOB. Continue treatment plan with emphasis on improving LE strength and progressing static standing balance activities to dynamic to decrease risk for falls     Personal Factors and Comorbidities Comorbidity 2;Comorbidity 1;Comorbidity 3+    Comorbidities history of falls, chronic right knee pain, compression fracture in spine, HTN    Examination-Activity  Limitations Locomotion Level;Transfers;Stand;Bend    Examination-Participation Restrictions Cleaning;Meal Prep;Shop    Stability/Clinical Decision Making Stable/Uncomplicated    Rehab Potential Fair    PT Frequency 1x / week    PT Duration 8 weeks    PT Treatment/Interventions ADLs/Self Care Home Management;Aquatic Therapy;Cryotherapy;Balance training;Therapeutic exercise;Therapeutic activities;Stair training;Functional mobility training;Gait training;DME Instruction;Neuromuscular re-education;Patient/family education;Manual techniques    PT Next Visit Plan functional strength, static and dynamic balance.  Progress to theraband postural strenghtening, tandem stance and sidestep. Continue with LE strengthening for HEP and progress static to dynamic standing/maneuvering    PT Home Exercise Plan 11/10:  bridge, seated LAQ, marching, sit to stands. 12/8: standing hip extension, adduction squeezes (seated)    Consulted and Agree with Plan of Care Patient           Patient will benefit from skilled therapeutic intervention in order to improve the following deficits and impairments:     Visit Diagnosis: Muscle weakness (generalized)  History of falling  Difficulty in walking, not elsewhere classified     Problem List Patient Active Problem List   Diagnosis Date Noted  . Trapezius muscle spasm 02/27/2020  . DDD (degenerative disc disease), cervical 01/02/2020  . Hoarseness of voice 11/27/2019  . 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  . Essential hypertension 12/14/2017  . Iron deficiency anemia 08/03/2017  . Dyspepsia   . Back pain 04/19/2017  . Normocytic anemia 01/12/2017  . Fatty liver 11/03/2015  . Anxiety state 11/03/2015  . Weight loss 04/05/2011  . GERD 06/01/2010  . IRRITABLE BOWEL  SYNDROME 07/21/2009   Documentation by: 11:37 AM, 04/29/20 M. Sherlyn Lees, PT, DPT Physical Therapist- [place] Office Number: [phone]  Treatment by: 11:37 AM, 04/29/20 [name], PT, DPT Physical Therapist- [place] Office Number: [phone]  [facility] [address] [place], [place], [address] Phone: [phone]   Fax:  [phone]  Name: Courtney Rogers MRN: [id number] Date of Birth: [birth year]

## 2020-04-30 ENCOUNTER — Ambulatory Visit (INDEPENDENT_AMBULATORY_CARE_PROVIDER_SITE_OTHER): Payer: Medicare Other | Admitting: Family Medicine

## 2020-04-30 ENCOUNTER — Encounter: Payer: Self-pay | Admitting: Family Medicine

## 2020-04-30 VITALS — BP 140/84 | HR 74 | Temp 97.9°F | Ht 64.0 in | Wt 161.0 lb

## 2020-04-30 DIAGNOSIS — R040 Epistaxis: Secondary | ICD-10-CM | POA: Diagnosis not present

## 2020-04-30 DIAGNOSIS — R238 Other skin changes: Secondary | ICD-10-CM | POA: Diagnosis not present

## 2020-04-30 DIAGNOSIS — R233 Spontaneous ecchymoses: Secondary | ICD-10-CM | POA: Insufficient documentation

## 2020-04-30 NOTE — Assessment & Plan Note (Signed)
On coumadin and asa- significant bruising in various stages of healing noted. She is encouraged to follow up with INR provider/clinic to be assessed.  Advised hydration, vitamin C and slow movement and position changes to prevent bruising as much as possible.

## 2020-04-30 NOTE — Patient Instructions (Signed)
  I appreciate the opportunity to provide you with care for your health and wellness. Today we discussed: bruising  Follow up: 06/30/2020 as scheduled   No labs or referrals today  Due to being on aspirin and coumadin you are having increased bleeding risk. Anytime you hit your body against something you will have increase risk of bruising.  This medication can cause increase nose bleeds as well.   I would follow up with your INR clinic about the nose bleeds as they might need to adjust the medication again.  It takes weeks sometimes for bruises to heal. Drinking water, taking vitamin C and keeping your legs elevated when sitting will help.  If you ever have a nose bleed that does not stop- please go to the ED immediately.  MERRY CHRISTMAS AND HAPPY NEW YEAR!  Please continue to practice social distancing to keep you, your family, and our community safe.  If you must go out, please wear a mask and practice good handwashing.  It was a pleasure to see you and I look forward to continuing to work together on your health and well-being. Please do not hesitate to call the office if you need care or have questions about your care.  Have a wonderful day. With Gratitude, Cherly Beach, DNP, AGNP-BC

## 2020-04-30 NOTE — Progress Notes (Signed)
Subjective:  Patient ID: Courtney Rogers, female    DOB: Mar 09, 1951  Age: 69 y.o. MRN: [id number]  CC:  Chief Complaint  Patient presents with  . Bleeding/Bruising    Has noticed she bruises easily for awhile now, mainly her arms and legs  . Epistaxis    X3 weeks intermittently out of both nostrils      HPI  HPI  Courtney Rogers is a 69 year old female patient of mine who presents today for an acute visit secondary to having increased bruising and nose bleeds recently. She is on coumadin therapy due to history of PE and CAD- medically managed. She reports her cardiologist does not seem to concerned about the bruising and does not touch her when she goes in to see him. So she called to be seen here about the bruising. She reports a recent INR of 3.6 she thinks. Unable to see in Epic system.  She does not have any other concerns today. She just wanted to know if she could make the bruising stop or what she could do the make the ones she has go away.   She denies active bleeding today in the office. She denies chest pain, fluttering, or leg swelling.   She is willing to call the office back and let them know about her bruising and nose bleeds after conversation today.   Today patient denies signs and symptoms of COVID 19 infection including fever, chills, cough, shortness of breath, and headache. Past Medical, Surgical, Social History, Allergies, and Medications have been Reviewed.   Past Medical History:  Diagnosis Date  . Acute myocardial infarction [facility]) 2009   CAD/no stent medically managed  . Anxiety disorder   . CAD (coronary artery disease)   . Hyperlipidemia   . Hypertension   . IBS (irritable bowel syndrome)   . Non-ulcer dyspepsia 04/30/2009   Qualifier: Diagnosis of  By: Oneida Alar MD, Sandi L   . Overactive bladder   . PE (pulmonary embolism) JUN 2014  . Presence of permanent cardiac pacemaker   . Sleep apnea     Current Meds  Medication Sig  . acetaminophen  (TYLENOL) 500 MG tablet Take 1,000 mg by mouth 2 (two) times daily as needed for moderate pain or headache.  . albuterol (PROVENTIL HFA;VENTOLIN HFA) 108 (90 BASE) MCG/ACT inhaler Inhale 2 puffs into the lungs every 6 (six) hours as needed for shortness of breath.  Marland Kitchen amLODipine (NORVASC) 5 MG tablet Take 5 mg by mouth daily.  Marland Kitchen aspirin EC 81 MG tablet Take 81 mg by mouth at bedtime.  Marland Kitchen b complex vitamins tablet Take 1 tablet by mouth daily.  . busPIRone (BUSPAR) 5 MG tablet Take 5 mg by mouth 2 (two) times daily.  . Calcium Carbonate-Vitamin D (CALCIUM 600+D PO) Take 1 tablet by mouth 2 (two) times daily.  . chlorthalidone (HYGROTON) 25 MG tablet Take 25 mg by mouth daily.   . Cyanocobalamin (B-12) 2500 MCG TABS Take 1 capsule by mouth daily.  . diclofenac (CATAFLAM) 50 MG tablet Take 50 mg by mouth 2 (two) times daily.  . fluticasone (FLONASE) 50 MCG/ACT nasal spray SPRAY 2 SPRAYS INTO EACH NOSTRIL ONCE DAILY  . folic acid (FOLVITE) 1 MG tablet TAKE 1 TABLET BY MOUTH ONCE DAILY.  Marland Kitchen gabapentin (NEURONTIN) 600 MG tablet Take 600 mg by mouth 2 (two) times daily.   Marland Kitchen ipratropium-albuterol (DUONEB) 0.5-2.5 (3) MG/3ML SOLN Supposed to be doing 4 times daily,  She has been  doing as needed  . lidocaine (XYLOCAINE) 2 % solution TAKE 2 TEASPOONFULS BEFORE MEALS AND AT BEDTIME AS NEEDED MAY REPEAT EVERY 4 HOURS. (MAX OF 8 DOSES PER DAY)  . losartan (COZAAR) 50 MG tablet Take 50 mg by mouth 2 (two) times daily.  . metoprolol succinate (TOPROL-XL) 50 MG 24 hr tablet Take 75 mg by mouth daily.   . montelukast (SINGULAIR) 10 MG tablet Take 10 mg by mouth daily.   . nitroGLYCERIN (NITROSTAT) 0.4 MG SL tablet Place 0.4 mg under the tongue every 5 (five) minutes as needed for chest pain.   . potassium chloride (K-DUR,KLOR-CON) 10 MEQ tablet Take 40 mEq by mouth 2 (two) times daily.   . Probiotic Product (PROBIOTIC PO) Take 1 capsule by mouth daily. [employer] [facility].  . sertraline (ZOLOFT) 100 MG tablet Take  200 mg by mouth daily.   . Simethicone (GAS-X PO) Take 2 tablets by mouth daily as needed (gas).  . simvastatin (ZOCOR) 40 MG tablet Take 40 mg by mouth at bedtime.  . triamcinolone cream (KENALOG) 0.1 % Apply 1 application topically as needed.   . vitamin C (ASCORBIC ACID) 500 MG tablet Take 500 mg by mouth daily.  Marland Kitchen warfarin (COUMADIN) 2.5 MG tablet Take 2.5 mg by mouth daily.  Marland Kitchen warfarin (COUMADIN) 5 MG tablet Take 5 mg by mouth at bedtime. Mon -Thurs   5 mg and Fri -Sun  2.5 mg    ROS:  Review of Systems  Constitutional: Negative.   HENT: Negative.   Eyes: Negative.   Respiratory: Negative.   Cardiovascular: Negative.   Gastrointestinal: Negative.   Genitourinary: Negative.   Musculoskeletal: Negative.   Skin: Negative.   Neurological: Negative.   Endo/Heme/Allergies: Bruises/bleeds easily.  Psychiatric/Behavioral: Negative.      Objective:   Today's Vitals: BP 140/84 (BP Location: Right Arm, Patient Position: Sitting, Cuff Size: Normal)   Pulse 74   Temp 97.9 F (36.6 C) (Temporal)   Ht 5\' 4"  (1.626 m)   Wt 161 lb (73 kg)   SpO2 98%   BMI 27.64 kg/m  Vitals with BMI 04/30/2020 04/14/2020 04/02/2020  Height 5\' 4"  - -  Weight 161 lbs 165 lbs 3 oz -  BMI 76.16 07.37 -  Systolic 106 269 485  Diastolic 84 78 74  Pulse 74 84 86     Physical Exam Vitals and nursing note reviewed.  Constitutional:      Appearance: Normal appearance. She is well-developed, well-groomed and overweight.  HENT:     Head: Normocephalic and atraumatic.     Right Ear: External ear normal.     Left Ear: External ear normal.     Mouth/Throat:     Comments: Mask in place  Eyes:     General:        Right eye: No discharge.        Left eye: No discharge.     Conjunctiva/sclera: Conjunctivae normal.  Cardiovascular:     Rate and Rhythm: Normal rate and regular rhythm.     Pulses: Normal pulses.     Heart sounds: Normal heart sounds.  Pulmonary:     Effort: Pulmonary effort is normal.      Breath sounds: Normal breath sounds.  Musculoskeletal:        General: Normal range of motion.     Cervical back: Normal range of motion and neck supple.     Comments: Gilford Rile is present   Skin:    General: Skin is warm.  Findings: Bruising and ecchymosis present.     Comments: Various areas of bruising that are at different stages of healing.  Neurological:     General: No focal deficit present.     Mental Status: She is alert and oriented to person, place, and time.  Psychiatric:        Attention and Perception: Attention normal.        Mood and Affect: Mood normal.        Speech: Speech normal.        Behavior: Behavior normal. Behavior is cooperative.        Thought Content: Thought content normal.        Cognition and Memory: Cognition normal.        Judgment: Judgment normal.    Assessment   1. Easy bruising   2. Epistaxis     Tests ordered No orders of the defined types were placed in this encounter.   Plan: Please see assessment and plan per problem list above.   No orders of the defined types were placed in this encounter.   Patient to follow-up in 06/30/2020   Note: This dictation was prepared with Dragon dictation along with smaller phrase technology. Similar sounding words can be transcribed inadequately or may not be corrected upon review. Any transcriptional errors that result from this process are unintentional.      Perlie Mayo, NP

## 2020-04-30 NOTE — Assessment & Plan Note (Signed)
No nose bleeding today. On coumadin and asa- and does have significant bruising in various stages of healing noted. She is encouraged to follow up with INR provider/clinic to be assessed.   Advised to proceed to the nearest Ed if nose bleeding occurs and will not stop.  Patient acknowledged agreement and understanding of the plan.

## 2020-05-07 ENCOUNTER — Ambulatory Visit (HOSPITAL_COMMUNITY): Payer: Medicare Other | Admitting: Physical Therapy

## 2020-05-07 ENCOUNTER — Other Ambulatory Visit: Payer: Self-pay

## 2020-05-07 ENCOUNTER — Encounter (HOSPITAL_COMMUNITY): Payer: Self-pay | Admitting: Physical Therapy

## 2020-05-07 DIAGNOSIS — Z9181 History of falling: Secondary | ICD-10-CM

## 2020-05-07 DIAGNOSIS — M6281 Muscle weakness (generalized): Secondary | ICD-10-CM

## 2020-05-07 DIAGNOSIS — R262 Difficulty in walking, not elsewhere classified: Secondary | ICD-10-CM

## 2020-05-07 NOTE — Patient Instructions (Signed)
Access Code: [email or web address] URL: [email or web address] Date: 05/07/2020 Prepared by: Sherlyn Lees  Exercises Side Stepping with Counter Support - 1 x daily - 7 x weekly Standing Hamstring Curl with Chair Support - 1 x daily - 7 x weekly - 3 sets - 10 reps

## 2020-05-07 NOTE — Therapy (Signed)
[place], [place], [address] Phone: [phone]   Fax:  [phone]  Physical Therapy Treatment  Patient Details  Name: Courtney Rogers MRN: [id number] Date of Birth: 1950/05/29 Referring Provider (PT): Cherly Beach NP   Encounter Date: 05/07/2020   PT End of Session - 05/07/20 1308    Visit Number 5    Number of Visits 8    Date for PT Re-Evaluation 05/20/20    Authorization Type [employer] medicare primary, BCBS secondary, 40 VL, eval needs to be sent off    Progress Note Due on Visit 10    PT Start Time 1310    PT Stop Time 1348    PT Time Calculation (min) 38 min    Equipment Utilized During Treatment Gait belt    Activity Tolerance Patient tolerated treatment well;Patient limited by fatigue;No increased pain    Behavior During Therapy WFL for tasks assessed/performed           Past Medical History:  Diagnosis Date  . Acute myocardial infarction [facility]) 2009   CAD/no stent medically managed  . Anxiety disorder   . CAD (coronary artery disease)   . Hyperlipidemia   . Hypertension   . IBS (irritable bowel syndrome)   . Non-ulcer dyspepsia 04/30/2009   Qualifier: Diagnosis of  By: Oneida Alar MD, Sandi L   . Overactive bladder   . PE (pulmonary embolism) JUN 2014  . Presence of permanent cardiac pacemaker   . Sleep apnea     Past Surgical History:  Procedure Laterality Date  . ABDOMINAL HYSTERECTOMY    . BSO secondary to cyst    . CARDIAC CATHETERIZATION    . COLONOSCOPY  12/2009   Dr. West Carbo, propofol, normal. Next TCS 12/2019  . COLONOSCOPY N/A 05/22/2017   Procedure: COLONOSCOPY;  Surgeon: Danie Binder, MD;  Location: [facility];  Service: Endoscopy;  Laterality: N/A;  10:30am  . ESOPHAGOGASTRODUODENOSCOPY  05/11/09   schatzki ring/small hiatal hernia/path:gastritis  . ESOPHAGOGASTRODUODENOSCOPY N/A 05/22/2017   Procedure: ESOPHAGOGASTRODUODENOSCOPY (EGD);  Surgeon: Danie Binder, MD;   Location: [facility];  Service: Endoscopy;  Laterality: N/A;  . ESOPHAGOGASTRODUODENOSCOPY (EGD) WITH ESOPHAGEAL DILATION N/A 04/01/2013   [id number]:[id number] at the gastroesophageal juction/multiple small polyps/mild gastritis  . GIVENS CAPSULE STUDY N/A 06/07/2017   Procedure: GIVENS CAPSULE STUDY;  Surgeon: Danie Binder, MD;  Location: [facility];  Service: Endoscopy;  Laterality: N/A;  7:30am  . INSERT / REPLACE / REMOVE PACEMAKER     Last year per pt.(can't remember date)    There were no vitals filed for this visit.   Subjective Assessment - 05/07/20 1307    Subjective Patient reports she has had a busy day with Dr. appointments and her left low back is hurting some due to exertion.  Denies any recent trips/falls and she has been using rollator more frequently. Pt reports she is having a procedure Tuesday 12/21 for cement injection to lumbar vertebrae    Currently in Pain? Yes    Pain Score 4     Pain Location Back    Pain Orientation Lower    Pain Descriptors / Indicators Aching;Sore    Pain Type Acute pain    Pain Radiating Towards left buttocks    Pain Onset More than a month ago              [facility] PT Assessment - 05/07/20 0001      Assessment   Medical  Diagnosis right upper trapezius spasm, fall     Referring Provider (PT) Cherly Beach NP                         [facility] Adult PT Treatment/Exercise - 05/07/20 0001      Neuro Re-ed    Neuro Re-ed Details  static standing balance on airex pad 3x30 sec, 3x10 sec eyes closed for proprioceptive challenge      Knee/Hip Exercises: Standing   Knee Flexion Strengthening;2 sets;10 reps   3 lbs   Other Standing Knee Exercises stair taps w/ 3 lbs ankle weights 2x2 min    Other Standing Knee Exercises side stepping w/ 3 lbs ankle weights 2x2 min      Knee/Hip Exercises: Seated   Long Arc Quad Strengthening;Both;3 sets;10 reps   3 lbs ankle weights   Ball Squeeze 3x10, 2 sec hold                  PT  Education - 05/07/20 1356    Education Details pt educated on rollator brake function    Person(s) Educated Patient    Methods Explanation;Demonstration    Comprehension Verbalized understanding;Returned demonstration            PT Short Term Goals - 04/01/20 1539      PT SHORT TERM GOAL #1   Title Patient will be independent in self management strategies to improve quality of life and functional outcomes.    Time 3    Period Weeks    Status On-going    Target Date 04/15/20      PT SHORT TERM GOAL #2   Title Patient will report at least 25% improvement in overall symptoms and/or function to demonstrate improved functional mobility    Time 3    Period Weeks    Status On-going    Target Date 04/15/20             PT Long Term Goals - 04/01/20 1540      PT LONG TERM GOAL #1   Title Patient will be able to perform 5x sit to stand without use of upper extremiteis in less then 18 seconds to demonstrate improved functional strength.    Time 8    Period Weeks    Status On-going      PT LONG TERM GOAL #2   Title Patient will be able to ambulate at least 226 feet in 2 minutes with required assistive device to demonstrate improved ambulatory mobility.    Time 8    Period Weeks    Status On-going      PT LONG TERM GOAL #3   Title Patient will be able to stand on either leg for at least 20 seconds while looking straight ahead with no upper extremity assist to improve static balance.    Time 8    Period Weeks    Status On-going                 Plan - 05/07/20 1317    Clinical Impression Statement Continues to exhibit balance and gait deviations with unsteadiness on feet noted and requiring 25% verbal cues for safety awareness and best practice with use of rollator for safety features, e.g. locking brakes and placement for seated rest periods using device.  Demonstrates Fair- dynamic standing balance. Continued PT services indicated to improve LE strength, dynamic  standing balance, and minimize risk for falls.    Personal Factors and  Comorbidities Comorbidity 2;Comorbidity 1;Comorbidity 3+    Comorbidities history of falls, chronic right knee pain, compression fracture in spine, HTN    Examination-Activity Limitations Locomotion Level;Transfers;Stand;Bend    Examination-Participation Restrictions Cleaning;Meal Prep;Shop    Stability/Clinical Decision Making Stable/Uncomplicated    Rehab Potential Fair    PT Frequency 1x / week    PT Duration 8 weeks    PT Treatment/Interventions ADLs/Self Care Home Management;Aquatic Therapy;Cryotherapy;Balance training;Therapeutic exercise;Therapeutic activities;Stair training;Functional mobility training;Gait training;DME Instruction;Neuromuscular re-education;Patient/family education;Manual techniques    PT Next Visit Plan functional strength, static and dynamic balance.  Progress to theraband postural strenghtening, tandem stance and sidestep. Continue with LE strengthening for HEP and progress static to dynamic standing/maneuvering    PT Home Exercise Plan 11/10:  bridge, seated LAQ, marching, sit to stands. 12/8: standing hip extension, adduction squeezes (seated). 12/16: sidestepping, standing hamstring curls    Consulted and Agree with Plan of Care Patient           Patient will benefit from skilled therapeutic intervention in order to improve the following deficits and impairments:  Pain,Abnormal gait,Decreased knowledge of use of DME,Decreased balance,Decreased mobility,Decreased strength,Difficulty walking,Postural dysfunction  Visit Diagnosis: Muscle weakness (generalized)  History of falling  Difficulty in walking, not elsewhere classified     Problem List Patient Active Problem List   Diagnosis Date Noted  . Easy bruising 04/30/2020  . Epistaxis 04/30/2020  . Trapezius muscle spasm 02/27/2020  . DDD (degenerative disc disease), cervical 01/02/2020  . Hoarseness of voice 11/27/2019  . Plasma  cell disorder 06/26/2019  . Essential hypertension 12/14/2017  . Iron deficiency anemia 08/03/2017  . Dyspepsia   . Back pain 04/19/2017  . Normocytic anemia 01/12/2017  . Fatty liver 11/03/2015  . Anxiety state 11/03/2015  . Weight loss 04/05/2011  . GERD 06/01/2010  . IRRITABLE BOWEL SYNDROME 07/21/2009    1:58 PM, 05/07/20 M. Sherlyn Lees, PT, DPT Physical Therapist- [place] Office Number: [phone]  [place] [address] [place], [place], [address] Phone: [phone]   Fax:  [phone]  Name: TAKERA RAYL MRN: [id number] Date of Birth: [birth year]

## 2020-05-08 ENCOUNTER — Ambulatory Visit: Payer: Medicare Other | Admitting: Neurology

## 2020-05-13 ENCOUNTER — Ambulatory Visit (HOSPITAL_COMMUNITY): Payer: Medicare Other | Admitting: Physical Therapy

## 2020-05-13 ENCOUNTER — Telehealth (HOSPITAL_COMMUNITY): Payer: Self-pay

## 2020-05-13 NOTE — Telephone Encounter (Signed)
No show. Called both listed phones and no answer or available voicemail.  Will D/C if patient does not call to schedule by end of certification date of 05/20/20

## 2020-06-02 ENCOUNTER — Ambulatory Visit: Payer: Medicare Other | Admitting: Nurse Practitioner

## 2020-06-05 ENCOUNTER — Ambulatory Visit (HOSPITAL_COMMUNITY): Payer: BC Managed Care – PPO | Attending: Family Medicine | Admitting: Physical Therapy

## 2020-06-05 ENCOUNTER — Encounter (HOSPITAL_COMMUNITY): Payer: Self-pay | Admitting: Physical Therapy

## 2020-06-05 ENCOUNTER — Other Ambulatory Visit: Payer: Self-pay

## 2020-06-05 DIAGNOSIS — M6281 Muscle weakness (generalized): Secondary | ICD-10-CM | POA: Insufficient documentation

## 2020-06-05 DIAGNOSIS — Z9181 History of falling: Secondary | ICD-10-CM | POA: Diagnosis present

## 2020-06-05 DIAGNOSIS — R262 Difficulty in walking, not elsewhere classified: Secondary | ICD-10-CM | POA: Diagnosis present

## 2020-06-05 NOTE — Therapy (Signed)
[facility] [address] [place], [place], [address] Phone: [phone]   Fax:  [phone]  Physical Therapy Treatment and Progress Note and Re-cert  Patient Details  Name: Courtney Rogers MRN: [id number] Date of Birth: [birth year] Referring Provider (PT): Tereasa Coop NP  Progress Note Reporting Period 03/25/20  To 06/05/20  See note below for Objective Data and Assessment of Progress/Goals.       Encounter Date: 06/05/2020   PT End of Session - 06/05/20 1443    Visit Number 6    Number of Visits 18    Date for PT Re-Evaluation 07/17/20    Authorization Type BCBS medicare primary, BCBS secondary, 40 VL, eval/recerts need to be sent off    Progress Note Due on Visit 16    PT Start Time 1445    PT Stop Time 1525    PT Time Calculation (min) 40 min    Equipment Utilized During Treatment Gait belt    Activity Tolerance Patient tolerated treatment well;Patient limited by fatigue;No increased pain    Behavior During Therapy WFL for tasks assessed/performed           Past Medical History:  Diagnosis Date  . Acute myocardial infarction [facility]) 2009   CAD/no stent medically managed  . Anxiety disorder   . CAD (coronary artery disease)   . Hyperlipidemia   . Hypertension   . IBS (irritable bowel syndrome)   . Non-ulcer dyspepsia 04/30/2009   Qualifier: Diagnosis of  By: Darrick Penna MD, Sandi L   . Overactive bladder   . PE (pulmonary embolism) JUN 2014  . Presence of permanent cardiac pacemaker   . Sleep apnea     Past Surgical History:  Procedure Laterality Date  . ABDOMINAL HYSTERECTOMY    . BSO secondary to cyst    . CARDIAC CATHETERIZATION    . COLONOSCOPY  12/2009   Dr. Aleene Davidson, propofol, normal. Next TCS 12/2019  . COLONOSCOPY N/A 05/22/2017   Procedure: COLONOSCOPY;  Surgeon: West Bali, MD;  [facility];  Service: Endoscopy;  Laterality: N/A;  10:30am  . ESOPHAGOGASTRODUODENOSCOPY  05/11/09   schatzki  ring/small hiatal hernia/path:gastritis  . ESOPHAGOGASTRODUODENOSCOPY N/A 05/22/2017   Procedure: ESOPHAGOGASTRODUODENOSCOPY (EGD);  Surgeon: West Bali, MD;  [facility];  Service: Endoscopy;  Laterality: N/A;  . ESOPHAGOGASTRODUODENOSCOPY (EGD) WITH ESOPHAGEAL DILATION N/A 04/01/2013   GHW:[id number] at the gastroesophageal juction/multiple small polyps/mild gastritis  . GIVENS CAPSULE STUDY N/A 06/07/2017   Procedure: GIVENS CAPSULE STUDY;  Surgeon: West Bali, MD;  [facility];  Service: Endoscopy;  Laterality: N/A;  7:30am  . INSERT / REPLACE / REMOVE PACEMAKER     Last year per pt.(can't remember date)    There were no vitals filed for this visit.   Subjective Assessment - 06/05/20 1449    Subjective Patient says she has slight pain in her knee, and had a shot in her back on 1/6 but just feels tender now. She states that she did not know that she had appointments between the last visit and now, but wants to continue with PT to improve her balance and strength.    Currently in Pain? Yes    Pain Score 2     Pain Location Back    Pain Orientation Lower    Pain Descriptors / Indicators Aching    Pain Onset More than a month ago  [facility] PT Assessment - 06/05/20 0001      Assessment   Medical Diagnosis right upper trapezius spasm, fall     Referring Provider (PT) Cherly Beach NP                         [facility] Adult PT Treatment/Exercise - 06/05/20 0001      Ambulation/Gait   Ambulation/Gait Yes    Ambulation/Gait Assistance 4: Min guard;5: Supervision    Ambulation/Gait Assistance Details see goals    Assistive device 4-wheeled walker;Straight cane   decreased time by approx 1 min with rollator vs SPC in walking 226 ft.   Gait Pattern Step-through pattern;Decreased arm swing - right;Decreased arm swing - left;Decreased step length - right;Decreased step length - left;Decreased hip/knee flexion - left;Decreased hip/knee  flexion - right    Ambulation Surface Level;Indoor    Gait velocity decreased      Balance   Balance Assessed Yes      High Level Balance   High Level Balance Comments SLS: no UE assist max 4-10 sec. 1 finger assist 20 sec B.      Knee/Hip Exercises: Standing   Other Standing Knee Exercises toe taps on 4" box with parallel bars progress from full hand held assist to only fingers assisting x3 min      Knee/Hip Exercises: Seated   Sit to Sand 5 reps   5x sit to stand: 17.6 sec no UE use                 PT Education - 06/05/20 1633    Education Details educated on PT function and continuing with HEP, progressing balance activities, and functional mobility    Person(s) Educated Patient    Methods Explanation    Comprehension Verbalized understanding            PT Short Term Goals - 06/05/20 1454      PT SHORT TERM GOAL #1   Title Patient will be independent in self management strategies to improve quality of life and functional outcomes.    Time 3    Period Weeks    Status On-going    Target Date 04/15/20      PT SHORT TERM GOAL #2   Title Patient will report at least 25% improvement in overall symptoms and/or function to demonstrate improved functional mobility    Baseline 1/14: 50%    Time 3    Period Weeks    Status Achieved    Target Date 04/15/20      PT SHORT TERM GOAL #3   Title Patient will statically stand for 2 minutes without assistance to allow for improved functioning in ADLs.    Baseline 1/14: 1:25    Time 3    Period Weeks    Status On-going    Target Date 06/26/20             PT Long Term Goals - 06/05/20 1454      PT LONG TERM GOAL #1   Title Patient will be able to perform 5x sit to stand without use of upper extremiteis in less then 18 seconds to demonstrate improved functional strength.    Baseline 1/14 17.6 sec    Time 8    Period Weeks    Status Achieved      PT LONG TERM GOAL #2   Title Patient will be able to ambulate at  least 226 feet in 2 minutes with required  assistive device to demonstrate improved ambulatory mobility.    Baseline 1/14: 124 with SPC, now using RW. 226 ft in 3:37, with rollator 2:28 to do 226 ft.    Time 8    Period Weeks    Status On-going      PT LONG TERM GOAL #3   Title Patient will be able to stand on either leg for at least 20 seconds while looking straight ahead with no upper extremity assist to improve static balance.    Baseline 1/14: able to complete B with 1-2 finger assist on counter, without finger assist max time 10 sec without    Time 8    Period Weeks    Status On-going                 Plan - 06/05/20 1634    Clinical Impression Statement Continues to demo decreased balance and safety, seen by the change in speed and maneuverability between the [facility] and rollator in ambulating 226 ft. She presents with decreased time in her 5xSTS goal, but continued to demo with static balance in SLS and with a NBOS, impacting her safety. She demoes increased reliance on support surfaces and  preference for looking at her feet during balance activities that impact her safety. Continues to benefit from skilled PT to improve her safety, community navigation, and strength to prevent falls and improve independent funciton.    Personal Factors and Comorbidities Comorbidity 2;Comorbidity 1;Comorbidity 3+    Comorbidities history of falls, chronic right knee pain, compression fracture in spine, HTN    Examination-Activity Limitations Locomotion Level;Transfers;Stand;Bend    Examination-Participation Restrictions Cleaning;Meal Prep;Shop    Stability/Clinical Decision Making Stable/Uncomplicated    Rehab Potential Fair    PT Frequency 2x / week    PT Duration 6 weeks    PT Treatment/Interventions ADLs/Self Care Home Management;Aquatic Therapy;Cryotherapy;Balance training;Therapeutic exercise;Therapeutic activities;Stair training;Functional mobility training;Gait training;DME  Instruction;Neuromuscular re-education;Patient/family education;Manual techniques    PT Next Visit Plan functional strength, static and dynamic balance gait training.    PT Home Exercise Plan 11/10:  bridge, seated LAQ, marching, sit to stands. 12/8: standing hip extension, adduction squeezes (seated). 12/16: sidestepping, standing hamstring curls    Consulted and Agree with Plan of Care Patient           Patient will benefit from skilled therapeutic intervention in order to improve the following deficits and impairments:  Pain,Abnormal gait,Decreased knowledge of use of DME,Decreased balance,Decreased mobility,Decreased strength,Difficulty walking,Postural dysfunction  Visit Diagnosis: Muscle weakness (generalized)  History of falling  Difficulty in walking, not elsewhere classified     Problem List Patient Active Problem List   Diagnosis Date Noted  . Easy bruising 04/30/2020  . Epistaxis 04/30/2020  . Trapezius muscle spasm 02/27/2020  . DDD (degenerative disc disease), cervical 01/02/2020  . Hoarseness of voice 11/27/2019  . 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  . Essential hypertension 12/14/2017  . Iron deficiency anemia 08/03/2017  . Dyspepsia   . Back pain 04/19/2017  . Normocytic anemia 01/12/2017  . Fatty liver 11/03/2015  . Anxiety state 11/03/2015  . Weight loss 04/05/2011  . GERD 06/01/2010  . IRRITABLE BOWEL SYNDROME 07/21/2009   4:51 PM, 06/05/20 [name], DPT Physical Therapy with [facility]  [phone] office  [place] [address] [place], [place], [address] Phone: [phone]   Fax:  [phone]  Name: Courtney Rogers MRN: [id number] Date of Birth: 1950/12/27

## 2020-06-10 ENCOUNTER — Ambulatory Visit (HOSPITAL_COMMUNITY): Payer: BC Managed Care – PPO | Admitting: Physical Therapy

## 2020-06-10 ENCOUNTER — Telehealth (HOSPITAL_COMMUNITY): Payer: Self-pay | Admitting: Physical Therapy

## 2020-06-10 NOTE — Telephone Encounter (Signed)
Pt did not show for appointment.  Called and left message regarding missed appointment and reminder of next scheduled appointment.   Teena Irani, PTA/CLT [phone]

## 2020-06-12 ENCOUNTER — Ambulatory Visit (HOSPITAL_COMMUNITY): Payer: BC Managed Care – PPO

## 2020-06-12 ENCOUNTER — Other Ambulatory Visit: Payer: Self-pay

## 2020-06-12 ENCOUNTER — Encounter (HOSPITAL_COMMUNITY): Payer: Self-pay

## 2020-06-12 DIAGNOSIS — R262 Difficulty in walking, not elsewhere classified: Secondary | ICD-10-CM

## 2020-06-12 DIAGNOSIS — M6281 Muscle weakness (generalized): Secondary | ICD-10-CM | POA: Diagnosis not present

## 2020-06-12 DIAGNOSIS — Z9181 History of falling: Secondary | ICD-10-CM

## 2020-06-12 NOTE — Therapy (Signed)
[facility] [place], [place], [address] Phone: [phone]   Fax:  [phone]  Physical Therapy Treatment  Patient Details  Name: Courtney Rogers MRN: [id number] Date of Birth: Dec 31, 1950 Referring Provider (PT): Cherly Beach NP   Encounter Date: 06/12/2020   PT End of Session - 06/12/20 1328    Visit Number 7    Number of Visits 18    Date for PT Re-Evaluation 07/17/20    Authorization Type BCBS medicare primary, BCBS secondary, 40 VL, eval/recerts need to be sent off    Progress Note Due on Visit 16    PT Start Time 1319    PT Stop Time 1357    PT Time Calculation (min) 38 min    Equipment Utilized During Treatment Gait belt    Activity Tolerance Patient tolerated treatment well;Patient limited by fatigue    Behavior During Therapy [facility] for tasks assessed/performed           Past Medical History:  Diagnosis Date  . Acute myocardial infarction [facility]) 2009   CAD/no stent medically managed  . Anxiety disorder   . CAD (coronary artery disease)   . Hyperlipidemia   . Hypertension   . IBS (irritable bowel syndrome)   . Non-ulcer dyspepsia 04/30/2009   Qualifier: Diagnosis of  By: Oneida Alar MD, Sandi L   . Overactive bladder   . PE (pulmonary embolism) JUN 2014  . Presence of permanent cardiac pacemaker   . Sleep apnea     Past Surgical History:  Procedure Laterality Date  . ABDOMINAL HYSTERECTOMY    . BSO secondary to cyst    . CARDIAC CATHETERIZATION    . COLONOSCOPY  12/2009   Dr. West Carbo, propofol, normal. Next TCS 12/2019  . COLONOSCOPY N/A 05/22/2017   Procedure: COLONOSCOPY;  Surgeon: Danie Binder, MD;  [facility];  Service: Endoscopy;  Laterality: N/A;  10:30am  . ESOPHAGOGASTRODUODENOSCOPY  05/11/09   schatzki ring/small hiatal hernia/path:gastritis  . ESOPHAGOGASTRODUODENOSCOPY N/A 05/22/2017   Procedure: ESOPHAGOGASTRODUODENOSCOPY (EGD);  Surgeon: Danie Binder, MD;  [facility];  Service: Endoscopy;  Laterality: N/A;  . ESOPHAGOGASTRODUODENOSCOPY (EGD) WITH ESOPHAGEAL DILATION N/A 04/01/2013   [id number] at the gastroesophageal juction/multiple small polyps/mild gastritis  . GIVENS CAPSULE STUDY N/A 06/07/2017   Procedure: GIVENS CAPSULE STUDY;  Surgeon: Danie Binder, MD;  [facility];  Service: Endoscopy;  Laterality: N/A;  7:30am  . INSERT / REPLACE / REMOVE PACEMAKER     Last year per pt.(can't remember date)    There were no vitals filed for this visit.   Subjective Assessment - 06/12/20 1327    Subjective Pt reports she is feeling good today.  Reports she has some discomfort abdominal region today, 2-3/10.    Currently in Pain? Yes    Pain Score 2     Pain Location Abdomen    Pain Descriptors / Indicators Aching    Pain Type Acute pain    Pain Onset More than a month ago    Pain Frequency Intermittent    Aggravating Factors  with movement                             [facility] Adult PT Treatment/Exercise - 06/12/20 0001      Knee/Hip Exercises: Standing   Heel Raises 15 reps    Heel Raises Limitations incline slope    Hip Flexion 10  reps;Knee bent    Hip Flexion Limitations alternating toe tapping 6in step no HHA    Functional Squat 10 reps    Functional Squat Limitations cueing for mechanics, front of chair    Gait Training 453ft with RW, cueing to look up and equal stride length      Knee/Hip Exercises: Seated   Sit to Sand 10 reps;without UE support   no HHA              Balance Exercises - 06/12/20 0001      Balance Exercises: Standing   Standing Eyes Opened Narrow base of support (BOS);Head turns   10x each   Tandem Stance 1 rep;30 secs;Foam/compliant surface;2 reps;Intermittent upper extremity support   static x 1; on foam 2x 30"   Sidestepping 2 reps;Theraband    Theraband Level (Sidestepping) Level 2 (Red)   around thigh   Marching Solid surface;10 reps   toe tapping alternating 6in  step no UE support              PT Short Term Goals - 06/05/20 1454      PT SHORT TERM GOAL #1   Title Patient will be independent in self management strategies to improve quality of life and functional outcomes.    Time 3    Period Weeks    Status On-going    Target Date 04/15/20      PT SHORT TERM GOAL #2   Title Patient will report at least 25% improvement in overall symptoms and/or function to demonstrate improved functional mobility    Baseline 1/14: 50%    Time 3    Period Weeks    Status Achieved    Target Date 04/15/20      PT SHORT TERM GOAL #3   Title Patient will statically stand for 2 minutes without assistance to allow for improved functioning in ADLs.    Baseline 1/14: 1:25    Time 3    Period Weeks    Status On-going    Target Date 06/26/20             PT Long Term Goals - 06/05/20 1454      PT LONG TERM GOAL #1   Title Patient will be able to perform 5x sit to stand without use of upper extremiteis in less then 18 seconds to demonstrate improved functional strength.    Baseline 1/14 17.6 sec    Time 8    Period Weeks    Status Achieved      PT LONG TERM GOAL #2   Title Patient will be able to ambulate at least 226 feet in 2 minutes with required assistive device to demonstrate improved ambulatory mobility.    Baseline 1/14: 124 with SPC, now using RW. 226 ft in 3:37, with rollator 2:28 to do 226 ft.    Time 8    Period Weeks    Status On-going      PT LONG TERM GOAL #3   Title Patient will be able to stand on either leg for at least 20 seconds while looking straight ahead with no upper extremity assist to improve static balance.    Baseline 1/14: able to complete B with 1-2 finger assist on counter, without finger assist max time 10 sec without    Time 8    Period Weeks    Status On-going                 Plan - 06/12/20 1526  Clinical Impression Statement Session focus with static/ dynamic balance training and functional  strengthening.  Pt able to demonstrate good static balance with NBOS activities, added head turns during NBOS and progressed to dynamic surface during tandem stance.  Pt required frequent cueing to look up and visual focus to improve static balance.  Added theraband resistance during sidestep and functional squats for gluteal strengthening.  Pt tolerated well to session with no reports of increased pain, was limited by fatigue required intermittent seated rest breaks through session.    Personal Factors and Comorbidities Comorbidity 2;Comorbidity 1;Comorbidity 3+    Comorbidities history of falls, chronic right knee pain, compression fracture in spine, HTN    Examination-Activity Limitations Locomotion Level;Transfers;Stand;Bend    Examination-Participation Restrictions Cleaning;Meal Prep;Shop    Stability/Clinical Decision Making Stable/Uncomplicated    Clinical Decision Making Low    Rehab Potential Fair    PT Frequency 2x / week    PT Duration 6 weeks    PT Treatment/Interventions ADLs/Self Care Home Management;Aquatic Therapy;Cryotherapy;Balance training;Therapeutic exercise;Therapeutic activities;Stair training;Functional mobility training;Gait training;DME Instruction;Neuromuscular re-education;Patient/family education;Manual techniques    PT Next Visit Plan functional strength, static and dynamic balance gait training.    PT Home Exercise Plan 11/10:  bridge, seated LAQ, marching, sit to stands. 12/8: standing hip extension, adduction squeezes (seated). 12/16: sidestepping, standing hamstring curls           Patient will benefit from skilled therapeutic intervention in order to improve the following deficits and impairments:  Pain,Abnormal gait,Decreased knowledge of use of DME,Decreased balance,Decreased mobility,Decreased strength,Difficulty walking,Postural dysfunction  Visit Diagnosis: Muscle weakness (generalized)  History of falling  Difficulty in walking, not elsewhere  classified     Problem List Patient Active Problem List   Diagnosis Date Noted  . Easy bruising 04/30/2020  . Epistaxis 04/30/2020  . Trapezius muscle spasm 02/27/2020  . DDD (degenerative disc disease), cervical 01/02/2020  . Hoarseness of voice 11/27/2019  . 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  . Essential hypertension 12/14/2017  . Iron deficiency anemia 08/03/2017  . Dyspepsia   . Back pain 04/19/2017  . Normocytic anemia 01/12/2017  . Fatty liver 11/03/2015  . Anxiety state 11/03/2015  . Weight loss 04/05/2011  . GERD 06/01/2010  . IRRITABLE BOWEL SYNDROME 07/21/2009   Ihor Austin, LPTA/CLT; CBIS [phone]  Aldona Lento 06/12/2020, 3:41 PM  [place] [address] [place], [place], [address] Phone: [phone]   Fax:  [phone]  Name: AJAYLAH CASTRICONE MRN: [id number] Date of Birth: 19-Jan-1951

## 2020-06-14 NOTE — Progress Notes (Signed)
NEUROLOGY CONSULTATION NOTE  Courtney Rogers MRN: [id number] DOB: [birth year]  Referring provider: Cherly Beach, NP Primary care provider: Cherly Beach, NP  Reason for consult:  Dizziness, unsteady gait.   Subjective:  Courtney Rogers is a 70 year old left-handed female with CAD, smoldering myeloma, HTN, HLD and history of PE on anticoagulation who presents for dizziness and unsteady gait.  History supplemented by referring provider's notes.  Balance problems since July. She describes a vague dizziness.  Not spinning but she feels "outside looking in", a wooziness.  It occurs when she is standing up and trying to walk or move.  She reports a previous similar dizziness and was treated with the Epley maneuver.  Her right knee will sometimes give out.  She needs a right knee replacement.  No pain in the legs or numbness in the feet.  She also feels clumsy with her hands.  CT head from 01/03/2020 personally reviewed showed mild diffuse atrophy and mild chronic small vessel disease.  She reports that she had an MRI of the brain as well, however results not available for my review  She is mostly sedentary.  She has been going to physical therapy to build up lower extremity strength.  B12 in October was 935.       PAST MEDICAL HISTORY: Past Medical History:  Diagnosis Date  . Acute myocardial infarction [facility]) 2009   CAD/no stent medically managed  . Anxiety disorder   . CAD (coronary artery disease)   . Hyperlipidemia   . Hypertension   . IBS (irritable bowel syndrome)   . Non-ulcer dyspepsia 04/30/2009   Qualifier: Diagnosis of  By: Oneida Alar MD, Sandi L   . Overactive bladder   . PE (pulmonary embolism) JUN 2014  . Presence of permanent cardiac pacemaker   . Sleep apnea     PAST SURGICAL HISTORY: Past Surgical History:  Procedure Laterality Date  . ABDOMINAL HYSTERECTOMY    . BSO secondary to cyst    . CARDIAC CATHETERIZATION    . COLONOSCOPY  12/2009   Dr. West Carbo, propofol,  normal. Next TCS 12/2019  . COLONOSCOPY N/A 05/22/2017   Procedure: COLONOSCOPY;  Surgeon: Danie Binder, MD;  [facility];  Service: Endoscopy;  Laterality: N/A;  10:30am  . ESOPHAGOGASTRODUODENOSCOPY  05/11/09   schatzki ring/small hiatal hernia/path:gastritis  . ESOPHAGOGASTRODUODENOSCOPY N/A 05/22/2017   Procedure: ESOPHAGOGASTRODUODENOSCOPY (EGD);  Surgeon: Danie Binder, MD;  [facility];  Service: Endoscopy;  Laterality: N/A;  . ESOPHAGOGASTRODUODENOSCOPY (EGD) WITH ESOPHAGEAL DILATION N/A 04/01/2013   [id number] at the gastroesophageal juction/multiple small polyps/mild gastritis  . GIVENS CAPSULE STUDY N/A 06/07/2017   Procedure: GIVENS CAPSULE STUDY;  Surgeon: Danie Binder, MD;  [facility];  Service: Endoscopy;  Laterality: N/A;  7:30am  . INSERT / REPLACE / REMOVE PACEMAKER     Last year per pt.(can't remember date)    MEDICATIONS: Current Outpatient Medications on File Prior to Visit  Medication Sig Dispense Refill  . acetaminophen (TYLENOL) 500 MG tablet Take 1,000 mg by mouth 2 (two) times daily as needed for moderate pain or headache.    . albuterol (PROVENTIL HFA;VENTOLIN HFA) 108 (90 BASE) MCG/ACT inhaler Inhale 2 puffs into the lungs every 6 (six) hours as needed for shortness of breath.    Marland Kitchen amLODipine (NORVASC) 5 MG tablet Take 5 mg by mouth daily.    Marland Kitchen aspirin EC 81 MG tablet Take 81 mg by mouth at bedtime.    Marland Kitchen  b complex vitamins tablet Take 1 tablet by mouth daily.    . busPIRone (BUSPAR) 5 MG tablet Take 5 mg by mouth 2 (two) times daily.    . Calcium Carbonate-Vitamin D (CALCIUM 600+D PO) Take 1 tablet by mouth 2 (two) times daily.    . chlorthalidone (HYGROTON) 25 MG tablet Take 25 mg by mouth daily.     . Cyanocobalamin (B-12) 2500 MCG TABS Take 1 capsule by mouth daily.    . diclofenac (CATAFLAM) 50 MG tablet Take 50 mg by mouth 2 (two) times daily.    . fluticasone (FLONASE) 50 MCG/ACT nasal spray SPRAY 2 SPRAYS  INTO EACH NOSTRIL ONCE DAILY 16 g 0  . folic acid (FOLVITE) 1 MG tablet TAKE 1 TABLET BY MOUTH ONCE DAILY. 30 tablet 11  . gabapentin (NEURONTIN) 600 MG tablet Take 600 mg by mouth 2 (two) times daily.     Marland Kitchen ipratropium-albuterol (DUONEB) 0.5-2.5 (3) MG/3ML SOLN Supposed to be doing 4 times daily,  She has been doing as needed    . lidocaine (XYLOCAINE) 2 % solution TAKE 2 TEASPOONFULS BEFORE MEALS AND AT BEDTIME AS NEEDED MAY REPEAT EVERY 4 HOURS. (MAX OF 8 DOSES PER DAY) 300 mL 5  . losartan (COZAAR) 50 MG tablet Take 50 mg by mouth 2 (two) times daily.    . metoprolol succinate (TOPROL-XL) 50 MG 24 hr tablet Take 75 mg by mouth daily.     . montelukast (SINGULAIR) 10 MG tablet Take 10 mg by mouth daily.     . nitroGLYCERIN (NITROSTAT) 0.4 MG SL tablet Place 0.4 mg under the tongue every 5 (five) minutes as needed for chest pain.     . potassium chloride (K-DUR,KLOR-CON) 10 MEQ tablet Take 40 mEq by mouth 2 (two) times daily.     . Probiotic Product (PROBIOTIC PO) Take 1 capsule by mouth daily. [facility].    . sertraline (ZOLOFT) 100 MG tablet Take 200 mg by mouth daily.     . Simethicone (GAS-X PO) Take 2 tablets by mouth daily as needed (gas).    . simvastatin (ZOCOR) 40 MG tablet Take 40 mg by mouth at bedtime.    . triamcinolone cream (KENALOG) 0.1 % Apply 1 application topically as needed.     . vitamin C (ASCORBIC ACID) 500 MG tablet Take 500 mg by mouth daily.    Marland Kitchen warfarin (COUMADIN) 2.5 MG tablet Take 2.5 mg by mouth daily.    Marland Kitchen warfarin (COUMADIN) 5 MG tablet Take 5 mg by mouth at bedtime. Mon -Thurs   5 mg and Fri -Sun  2.5 mg     No current facility-administered medications on file prior to visit.    ALLERGIES: Allergies  Allergen Reactions  . Biaxin [Clarithromycin] Other (See Comments)    Stomach problems   . Erythromycin   . Lisinopril Swelling    FAMILY HISTORY: Family History  Problem Relation Age of Onset  . Colon polyps Neg Hx   . Colon cancer Neg Hx      SOCIAL HISTORY: Social History   Socioeconomic History  . Marital status: Married    Spouse name: [name]  . Number of children: 3  . Years of education: Not on file  . Highest education level: Not on file  Occupational History  . Not on file  Tobacco Use  . Smoking status: Never Smoker  . Smokeless tobacco: Never Used  . Tobacco comment: Never smoked   Vaping Use  . Vaping Use: Never  used  Substance and Sexual Activity  . Alcohol use: No    Alcohol/week: 0.0 standard drinks  . Drug use: No  . Sexual activity: Not Currently  Other Topics Concern  . Not on file  Social History Narrative   Lives with husband-51 years 89 in Aug 2021   Daughter is close by, 2 sons live further away   One IN ,ONE IN [place], [place].        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   Enjoys: reading, young adult      Diet: eats all food groups    Caffeine: coffee 1, tea daily soda-1 daily   Water: 1-2 cups      Wears seat belt    Does not use phone while driving   Oceanographer at home    Public house manager -locked up   Social Determinants of Health   Financial Resource Strain: Low Risk   . Difficulty of Paying Living Expenses: Not hard at all  Food Insecurity: No Food Insecurity  . Worried About Charity fundraiser in the Last Year: Never true  . Ran Out of Food in the Last Year: Never true  Transportation Needs: No Transportation Needs  . Lack of Transportation (Medical): No  . Lack of Transportation (Non-Medical): No  Physical Activity: Inactive  . Days of Exercise per Week: 0 days  . Minutes of Exercise per Session: 0 min  Stress: No Stress Concern Present  . Feeling of Stress : Not at all  Social Connections: Moderately Integrated  . Frequency of Communication with Friends and Family: More than three times a week  . Frequency of Social Gatherings with Friends and Family: More than three times a week  . Attends Religious Services: More  than 4 times per year  . Active Member of Clubs or Organizations: No  . Attends Archivist Meetings: Never  . Marital Status: Married  Human resources officer Violence: Not At Risk  . Fear of Current or Ex-Partner: No  . Emotionally Abused: No  . Physically Abused: No  . Sexually Abused: No    Objective:  Blood pressure 137/82, pulse 78, resp. rate 18, height 5\' 4"  (1.626 m), weight 154 lb (69.9 kg), SpO2 97 %. General: No acute distress.  Patient appears well-groomed.   Head:  Normocephalic/atraumatic Eyes:  fundi examined but not visualized Neck: supple, no paraspinal tenderness, full range of motion Back: No paraspinal tenderness Heart: regular rate and rhythm Lungs: Clear to auscultation bilaterally. Vascular: No carotid bruits. Neurological Exam: Mental status: alert and oriented to person, place, and time, recent and remote memory intact, fund of knowledge intact, attention and concentration intact, speech fluent and not dysarthric, language intact. Cranial nerves: CN I: not tested CN II: pupils equal, round and reactive to light, visual fields intact CN III, IV, VI:  full range of motion, no nystagmus, no ptosis CN V: facial sensation intact. CN VII: upper and lower face symmetric CN VIII: hearing intact CN IX, X: gag intact, uvula midline CN XI: sternocleidomastoid and trapezius muscles intact CN XII: tongue midline Bulk & Tone: normal, no fasciculations. Motor:  muscle strength 5-/5 right lower extremity, otherwise 5/5 throughout Sensation:  Pinprick, temperature and vibratory sensation intact. Deep Tendon Reflexes:  2+ throughout,  toes downgoing.   Finger to nose testing:  Without dysmetria.   Heel to shin:  Without dysmetria.   Gait:  Ambulates with walker, slow gait with right limp.  Romberg  with sway..  Assessment/Plan:   Dizziness - vague.  Previously treated successfully with Epley maneuver but semiology not classic for BPPV.  CT head unremarkable and  possibly she had a previous MRI brain which reportedly was unremarkable for acute findings.  No findings on exam to suggest a primary intracranial abnormality. Unsteady gait - suspect multifactorial, related to arthritis in knee, possibly low back and deconditioning.    I will obtain results of MRI brain and if warranted will contact patient with other recommendations.  Otherwise, continue physical therapy.     Thank you for allowing me to take part in the care of this patient.  Metta Clines, DO  CC: Cherly Beach, NP

## 2020-06-15 ENCOUNTER — Ambulatory Visit (INDEPENDENT_AMBULATORY_CARE_PROVIDER_SITE_OTHER): Payer: Medicare Other | Admitting: Neurology

## 2020-06-15 ENCOUNTER — Encounter: Payer: Self-pay | Admitting: Neurology

## 2020-06-15 ENCOUNTER — Encounter (HOSPITAL_COMMUNITY): Payer: Medicare Other | Admitting: Physical Therapy

## 2020-06-15 ENCOUNTER — Other Ambulatory Visit: Payer: Self-pay

## 2020-06-15 VITALS — BP 137/82 | HR 78 | Resp 18 | Ht 64.0 in | Wt 154.0 lb

## 2020-06-15 DIAGNOSIS — R42 Dizziness and giddiness: Secondary | ICD-10-CM | POA: Diagnosis not present

## 2020-06-15 DIAGNOSIS — R2681 Unsteadiness on feet: Secondary | ICD-10-CM

## 2020-06-15 NOTE — Patient Instructions (Signed)
I don't think that there is a primary neurologic problem causing all of your symptoms.  I will get the mRI report and will let you know if I think anything different needs to be performed.

## 2020-06-16 ENCOUNTER — Ambulatory Visit (HOSPITAL_COMMUNITY): Payer: BC Managed Care – PPO | Admitting: Physical Therapy

## 2020-06-17 ENCOUNTER — Ambulatory Visit (HOSPITAL_COMMUNITY): Payer: BC Managed Care – PPO | Admitting: Physical Therapy

## 2020-06-17 ENCOUNTER — Encounter (HOSPITAL_COMMUNITY): Payer: Self-pay | Admitting: Physical Therapy

## 2020-06-17 ENCOUNTER — Other Ambulatory Visit: Payer: Self-pay

## 2020-06-17 DIAGNOSIS — M6281 Muscle weakness (generalized): Secondary | ICD-10-CM

## 2020-06-17 DIAGNOSIS — Z9181 History of falling: Secondary | ICD-10-CM

## 2020-06-17 DIAGNOSIS — R262 Difficulty in walking, not elsewhere classified: Secondary | ICD-10-CM

## 2020-06-17 NOTE — Therapy (Signed)
[place], [place], [address] Phone: [phone]   Fax:  [phone]  Physical Therapy Treatment  Patient Details  Name: Courtney Rogers MRN: [id number] Date of Birth: 09-09-1950 Referring Provider (PT): Cherly Beach NP   Encounter Date: 06/17/2020   PT End of Session - 06/17/20 [id number]    Visit Number 8    Number of Visits 18    Date for PT Re-Evaluation 07/17/20    Authorization Type BCBS medicare primary, BCBS secondary, 40 VL, eval/recerts need to be sent off    Progress Note Due on Visit 16    PT Start Time 1316    PT Stop Time 1355    PT Time Calculation (min) 39 min    Equipment Utilized During Treatment Gait belt    Activity Tolerance Patient tolerated treatment well;Patient limited by fatigue    Behavior During Therapy [facility] for tasks assessed/performed           Past Medical History:  Diagnosis Date  . Acute myocardial infarction [facility]) 2009   CAD/no stent medically managed  . Anxiety disorder   . CAD (coronary artery disease)   . Hyperlipidemia   . Hypertension   . IBS (irritable bowel syndrome)   . Non-ulcer dyspepsia 04/30/2009   Qualifier: Diagnosis of  By: Oneida Alar MD, Sandi L   . Overactive bladder   . PE (pulmonary embolism) JUN 2014  . Presence of permanent cardiac pacemaker   . Sleep apnea     Past Surgical History:  Procedure Laterality Date  . ABDOMINAL HYSTERECTOMY    . BSO secondary to cyst    . CARDIAC CATHETERIZATION    . COLONOSCOPY  12/2009   Dr. West Carbo, propofol, normal. Next TCS 12/2019  . COLONOSCOPY N/A 05/22/2017   Procedure: COLONOSCOPY;  Surgeon: Danie Binder, MD;  [facility];  Service: Endoscopy;  Laterality: N/A;  10:30am  . ESOPHAGOGASTRODUODENOSCOPY  05/11/09   schatzki ring/small hiatal hernia/path:gastritis  . ESOPHAGOGASTRODUODENOSCOPY N/A 05/22/2017   Procedure: ESOPHAGOGASTRODUODENOSCOPY (EGD);  Surgeon: Danie Binder, MD;  [facility];  Service: Endoscopy;  Laterality: N/A;  . ESOPHAGOGASTRODUODENOSCOPY (EGD) WITH ESOPHAGEAL DILATION N/A 04/01/2013   [id number] at the gastroesophageal juction/multiple small polyps/mild gastritis  . GIVENS CAPSULE STUDY N/A 06/07/2017   Procedure: GIVENS CAPSULE STUDY;  Surgeon: Danie Binder, MD;  [facility];  Service: Endoscopy;  Laterality: N/A;  7:30am  . INSERT / REPLACE / REMOVE PACEMAKER     Last year per pt.(can't remember date)    There were no vitals filed for this visit.   Subjective Assessment - 06/17/20 1323    Subjective States that she brought her 4 wheel walker today as it is lighter. States she hasn't done her exercises like she should because she is very busy.    Currently in Pain? Yes    Pain Score 4     Pain Location Back    Pain Orientation Lower    Pain Descriptors / Indicators Aching    Pain Onset More than a month ago              [facility] PT Assessment - 06/17/20 0001      Assessment   Medical Diagnosis right upper trapezius spasm, fall     Referring Provider (PT) Cherly Beach NP  [facility] PT Treatment/Exercise - 06/17/20 0001      Ambulation/Gait   Ambulation/Gait Yes    Ambulation/Gait Assistance 4: Min guard;5: Supervision    Ambulation Distance (Feet) 250 Feet    Assistive device 4-wheeled walker    Gait Pattern Step-through pattern;Decreased arm swing - right;Decreased arm swing - left;Decreased step length - right;Decreased step length - left;Decreased hip/knee flexion - left;Decreased hip/knee flexion - right    Ambulation Surface Level;Indoor    Gait velocity decreased      Exercises   Exercises Shoulder      Knee/Hip Exercises: Standing   Hip Abduction AROM;Stengthening;3 sets;10 reps;Knee straight   at table with upper extremity   Hip Extension Stengthening;3 sets;10 reps    Extension Limitations at table with UE support      Shoulder Exercises: Standing    Extension AROM;Strengthening;Both;20 reps;Theraband    Theraband Level (Shoulder Extension) Level 2 (Red)    Other Standing Exercises pallof press red theraband - 2x10 B               Balance Exercises - 06/17/20 0001      Balance Exercises: Standing   Standing Eyes Opened Narrow base of support (BOS);Foam/compliant surface;5 reps;Head turns   4 sets bilaterally - occasional UE support   SLS Eyes open;5 reps   max of 25 second on either leg - occassional UE support as needed            PT Education - 06/17/20 1324    Education Details on walker height - adjusted for patients height, on HEP    Person(s) Educated Patient    Methods Explanation    Comprehension Verbalized understanding            PT Short Term Goals - 06/05/20 1454      PT SHORT TERM GOAL #1   Title Patient will be independent in self management strategies to improve quality of life and functional outcomes.    Time 3    Period Weeks    Status On-going    Target Date 04/15/20      PT SHORT TERM GOAL #2   Title Patient will report at least 25% improvement in overall symptoms and/or function to demonstrate improved functional mobility    Baseline 1/14: 50%    Time 3    Period Weeks    Status Achieved    Target Date 04/15/20      PT SHORT TERM GOAL #3   Title Patient will statically stand for 2 minutes without assistance to allow for improved functioning in ADLs.    Baseline 1/14: 1:25    Time 3    Period Weeks    Status On-going    Target Date 06/26/20             PT Long Term Goals - 06/05/20 1454      PT LONG TERM GOAL #1   Title Patient will be able to perform 5x sit to stand without use of upper extremiteis in less then 18 seconds to demonstrate improved functional strength.    Baseline 1/14 17.6 sec    Time 8    Period Weeks    Status Achieved      PT LONG TERM GOAL #2   Title Patient will be able to ambulate at least 226 feet in 2 minutes with required assistive device to  demonstrate improved ambulatory mobility.    Baseline 1/14: 124 with SPC, now using RW. 226 ft in 3:37,  with rollator 2:28 to do 226 ft.    Time 8    Period Weeks    Status On-going      PT LONG TERM GOAL #3   Title Patient will be able to stand on either leg for at least 20 seconds while looking straight ahead with no upper extremity assist to improve static balance.    Baseline 1/14: able to complete B with 1-2 finger assist on counter, without finger assist max time 10 sec without    Time 8    Period Weeks    Status On-going                 Plan - 06/17/20 1353    Clinical Impression Statement Focused on standing exercises secondary to patient report that standing exercises easier to perform at home. Cues to stand tall throughout session as patient has tendency to lean forward. Continued with balance exercises and with fatigue balance exercises were more challenging. No increase in pain noted during session. 2 loss of balances noted during balance exercise but patient able to correct with stepping strategy and with upper extremity support. Will continue to focus on balance and posture exercises as tolerated.    Personal Factors and Comorbidities Comorbidity 2;Comorbidity 1;Comorbidity 3+    Comorbidities history of falls, chronic right knee pain, compression fracture in spine, HTN    Examination-Activity Limitations Locomotion Level;Transfers;Stand;Bend    Examination-Participation Restrictions Cleaning;Meal Prep;Shop    Stability/Clinical Decision Making Stable/Uncomplicated    Rehab Potential Fair    PT Frequency 2x / week    PT Duration 6 weeks    PT Treatment/Interventions ADLs/Self Care Home Management;Aquatic Therapy;Cryotherapy;Balance training;Therapeutic exercise;Therapeutic activities;Stair training;Functional mobility training;Gait training;DME Instruction;Neuromuscular re-education;Patient/family education;Manual techniques    PT Next Visit Plan functional strength,  static and dynamic balance gait training.    PT Home Exercise Plan 11/10:  bridge, seated LAQ, marching, sit to stands. 12/8: standing hip extension, adduction squeezes (seated). 12/16: sidestepping, standing hamstring curls; 1/26 hip abd and ext           Patient will benefit from skilled therapeutic intervention in order to improve the following deficits and impairments:  Pain,Abnormal gait,Decreased knowledge of use of DME,Decreased balance,Decreased mobility,Decreased strength,Difficulty walking,Postural dysfunction  Visit Diagnosis: Muscle weakness (generalized)  History of falling  Difficulty in walking, not elsewhere classified     Problem List Patient Active Problem List   Diagnosis Date Noted  . Easy bruising 04/30/2020  . Epistaxis 04/30/2020  . Trapezius muscle spasm 02/27/2020  . DDD (degenerative disc disease), cervical 01/02/2020  . Hoarseness of voice 11/27/2019  . 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  . Essential hypertension 12/14/2017  . Iron deficiency anemia 08/03/2017  . Dyspepsia   . Back pain 04/19/2017  . Normocytic anemia 01/12/2017  . Fatty liver 11/03/2015  . Anxiety state 11/03/2015  . Weight loss 04/05/2011  . GERD 06/01/2010  . IRRITABLE BOWEL SYNDROME 07/21/2009    1:56 PM, 06/17/20 [name], DPT Physical Therapy with [facility]  [phone] office  [place] [address] [place], [place], [address] Phone: [phone]   Fax:  [phone]  Name: Courtney Rogers MRN: [id number] Date of Birth: August 25, 1950

## 2020-06-18 ENCOUNTER — Ambulatory Visit: Payer: Medicare Other | Admitting: Nurse Practitioner

## 2020-06-23 ENCOUNTER — Encounter (HOSPITAL_COMMUNITY): Payer: Self-pay

## 2020-06-23 ENCOUNTER — Ambulatory Visit (HOSPITAL_COMMUNITY): Payer: Medicare Other | Attending: Family Medicine

## 2020-06-23 ENCOUNTER — Ambulatory Visit: Payer: Medicare Other | Admitting: Neurology

## 2020-06-23 ENCOUNTER — Other Ambulatory Visit: Payer: Self-pay

## 2020-06-23 DIAGNOSIS — R262 Difficulty in walking, not elsewhere classified: Secondary | ICD-10-CM | POA: Insufficient documentation

## 2020-06-23 DIAGNOSIS — M6281 Muscle weakness (generalized): Secondary | ICD-10-CM | POA: Insufficient documentation

## 2020-06-23 DIAGNOSIS — Z9181 History of falling: Secondary | ICD-10-CM | POA: Insufficient documentation

## 2020-06-23 NOTE — Therapy (Signed)
[facility] [address] [place], [place], [address] Phone: [phone]   Fax:  [phone]  Physical Therapy Treatment  Patient Details  Name: Courtney Rogers MRN: [id number] Date of Birth: 1950-08-19 Referring Provider (PT): Cherly Beach NP   Encounter Date: 06/23/2020   PT End of Session - 06/23/20 1324    Visit Number 9    Number of Visits 18    Date for PT Re-Evaluation 07/17/20    Authorization Type BCBS medicare primary, BCBS secondary, 40 VL, eval/recerts need to be sent off    Progress Note Due on Visit 16    PT Start Time 1308    PT Stop Time 1355    PT Time Calculation (min) 47 min    Equipment Utilized During Treatment Gait belt    Activity Tolerance Patient tolerated treatment well;Patient limited by fatigue    Behavior During Therapy [facility] for tasks assessed/performed           Past Medical History:  Diagnosis Date  . Acute myocardial infarction [facility]) 2009   CAD/no stent medically managed  . Anxiety disorder   . CAD (coronary artery disease)   . Hyperlipidemia   . Hypertension   . IBS (irritable bowel syndrome)   . Non-ulcer dyspepsia 04/30/2009   Qualifier: Diagnosis of  By: Oneida Alar MD, Sandi L   . Overactive bladder   . PE (pulmonary embolism) JUN 2014  . Presence of permanent cardiac pacemaker   . Sleep apnea     Past Surgical History:  Procedure Laterality Date  . ABDOMINAL HYSTERECTOMY    . BSO secondary to cyst    . CARDIAC CATHETERIZATION    . COLONOSCOPY  12/2009   Dr. West Carbo, propofol, normal. Next TCS 12/2019  . COLONOSCOPY N/A 05/22/2017   Procedure: COLONOSCOPY;  Surgeon: Danie Binder, MD;  [facility];  Service: Endoscopy;  Laterality: N/A;  10:30am  . ESOPHAGOGASTRODUODENOSCOPY  05/11/09   schatzki ring/small hiatal hernia/path:gastritis  . ESOPHAGOGASTRODUODENOSCOPY N/A 05/22/2017   Procedure: ESOPHAGOGASTRODUODENOSCOPY (EGD);  Surgeon: Danie Binder, MD;  [facility];  Service: Endoscopy;  Laterality: N/A;  . ESOPHAGOGASTRODUODENOSCOPY (EGD) WITH ESOPHAGEAL DILATION N/A 04/01/2013   HDQ:[id number] at the gastroesophageal juction/multiple small polyps/mild gastritis  . GIVENS CAPSULE STUDY N/A 06/07/2017   Procedure: GIVENS CAPSULE STUDY;  Surgeon: Danie Binder, MD;  [facility];  Service: Endoscopy;  Laterality: N/A;  7:30am  . INSERT / REPLACE / REMOVE PACEMAKER     Last year per pt.(can't remember date)    There were no vitals filed for this visit.   Subjective Assessment - 06/23/20 1321    Subjective Pt reports she has 2 falls in the last week.  One yesterday as trying to put on socks while standing and last week feel from computer chair rolling from her, hit her head against wall.  Has began new HEP exercises without questions.    Currently in Pain? Yes    Pain Score 3     Pain Location Back    Pain Orientation Lower    Pain Descriptors / Indicators Sore;Aching    Pain Type Acute pain    Pain Onset More than a month ago    Pain Frequency Intermittent    Aggravating Factors  with movement                             [facility] Adult  PT Treatment/Exercise - 06/23/20 0001      Ambulation/Gait   Ambulation/Gait Assistance 5: Supervision    Ambulation Distance (Feet) 226 Feet    Assistive device 4-wheeled walker      Knee/Hip Exercises: Standing   Hip Abduction 2 sets;10 reps    Abduction Limitations Cueing for posture and reduce ER    Hip Extension 2 sets;10 reps    Extension Limitations cueing for posture    Other Standing Knee Exercises wall arch 10x      Shoulder Exercises: Standing   Extension Strengthening;15 reps;Theraband    Theraband Level (Shoulder Extension) Level 2 (Red)    Row 15 reps;Theraband    Theraband Level (Shoulder Row) Level 2 (Red)               Balance Exercises - 06/23/20 0001      Balance Exercises: Standing   Standing Eyes Opened Narrow base of support  (BOS);Foam/compliant surface;5 reps   head turns   Tandem Stance 4 reps   partial tandem stance paloff RTB 4x 10 reps   Other Standing Exercises Toe tapping 6in step height alternating             PT Education - 06/23/20 1343    Education Details Encouraged pt to assure chair static positioned and to feel chair back of legs prior sitting and to put on socks in seated position to reduce risk of fall.    Person(s) Educated Patient    Methods Explanation    Comprehension Verbalized understanding            PT Short Term Goals - 06/05/20 1454      PT SHORT TERM GOAL #1   Title Patient will be independent in self management strategies to improve quality of life and functional outcomes.    Time 3    Period Weeks    Status On-going    Target Date 04/15/20      PT SHORT TERM GOAL #2   Title Patient will report at least 25% improvement in overall symptoms and/or function to demonstrate improved functional mobility    Baseline 1/14: 50%    Time 3    Period Weeks    Status Achieved    Target Date 04/15/20      PT SHORT TERM GOAL #3   Title Patient will statically stand for 2 minutes without assistance to allow for improved functioning in ADLs.    Baseline 1/14: 1:25    Time 3    Period Weeks    Status On-going    Target Date 06/26/20             PT Long Term Goals - 06/05/20 1454      PT LONG TERM GOAL #1   Title Patient will be able to perform 5x sit to stand without use of upper extremiteis in less then 18 seconds to demonstrate improved functional strength.    Baseline 1/14 17.6 sec    Time 8    Period Weeks    Status Achieved      PT LONG TERM GOAL #2   Title Patient will be able to ambulate at least 226 feet in 2 minutes with required assistive device to demonstrate improved ambulatory mobility.    Baseline 1/14: 124 with SPC, now using RW. 226 ft in 3:37, with rollator 2:28 to do 226 ft.    Time 8    Period Weeks    Status On-going      PT  LONG TERM GOAL #3    Title Patient will be able to stand on either leg for at least 20 seconds while looking straight ahead with no upper extremity assist to improve static balance.    Baseline 1/14: able to complete B with 1-2 finger assist on counter, without finger assist max time 10 sec without    Time 8    Period Weeks    Status On-going                 Plan - 06/23/20 1344    Clinical Impression Statement Reviewed safe mechanics to reduce risk of falls including to assure chair static positioned and to feel wiht legs prior sitting and to put on socks in seated position, encouraged to walk with AD and reduce clutter in home.  Session focus on balalnce training, LE strengthening and postural strengthening.  Added wall arch for postural strengthening.  Continued with static and dynamic balance activiites.  2 LOB required min A for fall prevention, pt able to complete with step stragety and UE support for safety.  No reports of pain through session.    Personal Factors and Comorbidities Comorbidity 2;Comorbidity 1;Comorbidity 3+    Comorbidities history of falls, chronic right knee pain, compression fracture in spine, HTN    Examination-Activity Limitations Locomotion Level;Transfers;Stand;Bend    Examination-Participation Restrictions Cleaning;Meal Prep;Shop    Stability/Clinical Decision Making Stable/Uncomplicated    Clinical Decision Making Low    Rehab Potential Fair    PT Frequency 2x / week    PT Duration 6 weeks    PT Treatment/Interventions ADLs/Self Care Home Management;Aquatic Therapy;Cryotherapy;Balance training;Therapeutic exercise;Therapeutic activities;Stair training;Functional mobility training;Gait training;DME Instruction;Neuromuscular re-education;Patient/family education;Manual techniques    PT Next Visit Plan functional strength, static and dynamic balance gait training.    PT Home Exercise Plan 11/10:  bridge, seated LAQ, marching, sit to stands. 12/8: standing hip extension,  adduction squeezes (seated). 12/16: sidestepping, standing hamstring curls; 1/26 hip abd and ext           Patient will benefit from skilled therapeutic intervention in order to improve the following deficits and impairments:  Pain,Abnormal gait,Decreased knowledge of use of DME,Decreased balance,Decreased mobility,Decreased strength,Difficulty walking,Postural dysfunction  Visit Diagnosis: Difficulty in walking, not elsewhere classified  History of falling  Muscle weakness (generalized)     Problem List Patient Active Problem List   Diagnosis Date Noted  . Easy bruising 04/30/2020  . Epistaxis 04/30/2020  . Trapezius muscle spasm 02/27/2020  . DDD (degenerative disc disease), cervical 01/02/2020  . Hoarseness of voice 11/27/2019  . 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  . Essential hypertension 12/14/2017  . Iron deficiency anemia 08/03/2017  . Dyspepsia   . Back pain 04/19/2017  . Normocytic anemia 01/12/2017  . Fatty liver 11/03/2015  . Anxiety state 11/03/2015  . Weight loss 04/05/2011  . GERD 06/01/2010  . IRRITABLE BOWEL SYNDROME 07/21/2009   Ihor Austin, LPTA/CLT; CBIS [phone]  Aldona Lento 06/23/2020, 2:12 PM  [facility] [address] [place], [place], [address] Phone: [phone]   Fax:  [phone]  Name: Courtney Rogers MRN: [id number] Date of Birth: [birth year]

## 2020-06-25 ENCOUNTER — Ambulatory Visit (HOSPITAL_COMMUNITY): Payer: Medicare Other

## 2020-06-25 ENCOUNTER — Encounter (HOSPITAL_COMMUNITY): Payer: Self-pay

## 2020-06-25 ENCOUNTER — Other Ambulatory Visit: Payer: Self-pay

## 2020-06-25 DIAGNOSIS — R262 Difficulty in walking, not elsewhere classified: Secondary | ICD-10-CM | POA: Diagnosis not present

## 2020-06-25 DIAGNOSIS — M6281 Muscle weakness (generalized): Secondary | ICD-10-CM

## 2020-06-25 DIAGNOSIS — Z9181 History of falling: Secondary | ICD-10-CM

## 2020-06-25 NOTE — Therapy (Signed)
[facility] [address] [place], [place], [address] Phone: [phone]   Fax:  [phone]  Physical Therapy Treatment  Patient Details  Name: Courtney Rogers MRN: [id number] Date of Birth: 09-02-1950 Referring Provider (PT): Cherly Beach NP   Encounter Date: 06/25/2020   PT End of Session - 06/25/20 1417    Visit Number 10    Number of Visits 18    Date for PT Re-Evaluation 07/17/20    Authorization Type BCBS medicare primary, BCBS secondary, 40 VL, eval/recerts need to be sent off    Progress Note Due on Visit 16    PT Start Time 1308   late arrival   PT Stop Time 1346    PT Time Calculation (min) 38 min    Equipment Utilized During Treatment Gait belt    Activity Tolerance Patient tolerated treatment well;Patient limited by fatigue    Behavior During Therapy [facility] for tasks assessed/performed           Past Medical History:  Diagnosis Date  . Acute myocardial infarction [facility]) 2009   CAD/no stent medically managed  . Anxiety disorder   . CAD (coronary artery disease)   . Hyperlipidemia   . Hypertension   . IBS (irritable bowel syndrome)   . Non-ulcer dyspepsia 04/30/2009   Qualifier: Diagnosis of  By: Oneida Alar MD, Sandi L   . Overactive bladder   . PE (pulmonary embolism) JUN 2014  . Presence of permanent cardiac pacemaker   . Sleep apnea     Past Surgical History:  Procedure Laterality Date  . ABDOMINAL HYSTERECTOMY    . BSO secondary to cyst    . CARDIAC CATHETERIZATION    . COLONOSCOPY  12/2009   Dr. West Carbo, propofol, normal. Next TCS 12/2019  . COLONOSCOPY N/A 05/22/2017   Procedure: COLONOSCOPY;  Surgeon: Danie Binder, MD;  [facility];  Service: Endoscopy;  Laterality: N/A;  10:30am  . ESOPHAGOGASTRODUODENOSCOPY  05/11/09   schatzki ring/small hiatal hernia/path:gastritis  . ESOPHAGOGASTRODUODENOSCOPY N/A 05/22/2017   Procedure: ESOPHAGOGASTRODUODENOSCOPY (EGD);  Surgeon: Danie Binder, MD;   [facility];  Service: Endoscopy;  Laterality: N/A;  . ESOPHAGOGASTRODUODENOSCOPY (EGD) WITH ESOPHAGEAL DILATION N/A 04/01/2013   PNT:[id number] at the gastroesophageal juction/multiple small polyps/mild gastritis  . GIVENS CAPSULE STUDY N/A 06/07/2017   Procedure: GIVENS CAPSULE STUDY;  Surgeon: Danie Binder, MD;  [facility];  Service: Endoscopy;  Laterality: N/A;  7:30am  . INSERT / REPLACE / REMOVE PACEMAKER     Last year per pt.(can't remember date)    There were no vitals filed for this visit.   Subjective Assessment - 06/25/20 1414    Subjective Pt stated she is feeling good, no reports of pain unless she moves Rt knee weird.  No reports of recent falls.    Currently in Pain? No/denies                             [facility] Adult PT Treatment/Exercise - 06/25/20 0001      Knee/Hip Exercises: Standing   Hip Abduction 2 sets;10 reps    Abduction Limitations Cueing for posture and reduce ER    Hip Extension 2 sets;10 reps    Extension Limitations cueing for posture    Forward Step Up Both;10 reps;Hand Hold: 1;Step Height: 4"               Balance Exercises - 06/25/20  0001      Balance Exercises: Standing   Standing Eyes Opened Narrow base of support (BOS);3 reps;30 secs    Tandem Stance Eyes open;1 rep;Foam/compliant surface;2 reps;30 secs    Tandem Gait 1 rep    Retro Gait 1 rep    Sidestepping 2 reps;Theraband    Theraband Level (Sidestepping) Level 2 (Red)    Other Standing Exercises Toe tapping 8in step height alternating no HHA 2x 10               PT Short Term Goals - 06/05/20 1454      PT SHORT TERM GOAL #1   Title Patient will be independent in self management strategies to improve quality of life and functional outcomes.    Time 3    Period Weeks    Status On-going    Target Date 04/15/20      PT SHORT TERM GOAL #2   Title Patient will report at least 25% improvement in overall symptoms and/or function to  demonstrate improved functional mobility    Baseline 1/14: 50%    Time 3    Period Weeks    Status Achieved    Target Date 04/15/20      PT SHORT TERM GOAL #3   Title Patient will statically stand for 2 minutes without assistance to allow for improved functioning in ADLs.    Baseline 1/14: 1:25    Time 3    Period Weeks    Status On-going    Target Date 06/26/20             PT Long Term Goals - 06/05/20 1454      PT LONG TERM GOAL #1   Title Patient will be able to perform 5x sit to stand without use of upper extremiteis in less then 18 seconds to demonstrate improved functional strength.    Baseline 1/14 17.6 sec    Time 8    Period Weeks    Status Achieved      PT LONG TERM GOAL #2   Title Patient will be able to ambulate at least 226 feet in 2 minutes with required assistive device to demonstrate improved ambulatory mobility.    Baseline 1/14: 124 with SPC, now using RW. 226 ft in 3:37, with rollator 2:28 to do 226 ft.    Time 8    Period Weeks    Status On-going      PT LONG TERM GOAL #3   Title Patient will be able to stand on either leg for at least 20 seconds while looking straight ahead with no upper extremity assist to improve static balance.    Baseline 1/14: able to complete B with 1-2 finger assist on counter, without finger assist max time 10 sec without    Time 8    Period Weeks    Status On-going                 Plan - 06/25/20 1455    Clinical Impression Statement Added step ups for functional strengthening and progressed balance activities.  Added foam wiht tandem stance as well as gait associate balance activities including tandem, retro and sidestep with theraband resistance.  Pt required min A for LOB episodes and cueing for posture through session.    Personal Factors and Comorbidities Comorbidity 2;Comorbidity 1;Comorbidity 3+    Comorbidities history of falls, chronic right knee pain, compression fracture in spine, HTN     Examination-Activity Limitations Locomotion Level;Transfers;Stand;Bend  Examination-Participation Restrictions Cleaning;Meal Prep;Shop    Stability/Clinical Decision Making Stable/Uncomplicated    Clinical Decision Making Low    Rehab Potential Fair    PT Frequency 2x / week    PT Duration 6 weeks    PT Treatment/Interventions ADLs/Self Care Home Management;Aquatic Therapy;Cryotherapy;Balance training;Therapeutic exercise;Therapeutic activities;Stair training;Functional mobility training;Gait training;DME Instruction;Neuromuscular re-education;Patient/family education;Manual techniques    PT Next Visit Plan functional strength, static and dynamic balance gait training.    PT Home Exercise Plan 11/10:  bridge, seated LAQ, marching, sit to stands. 12/8: standing hip extension, adduction squeezes (seated). 12/16: sidestepping, standing hamstring curls; 1/26 hip abd and ext           Patient will benefit from skilled therapeutic intervention in order to improve the following deficits and impairments:  Pain,Abnormal gait,Decreased knowledge of use of DME,Decreased balance,Decreased mobility,Decreased strength,Difficulty walking,Postural dysfunction  Visit Diagnosis: History of falling  Muscle weakness (generalized)  Difficulty in walking, not elsewhere classified     Problem List Patient Active Problem List   Diagnosis Date Noted  . Easy bruising 04/30/2020  . Epistaxis 04/30/2020  . Trapezius muscle spasm 02/27/2020  . DDD (degenerative disc disease), cervical 01/02/2020  . Hoarseness of voice 11/27/2019  . 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  . Essential hypertension 12/14/2017  . Iron deficiency anemia 08/03/2017  . Dyspepsia   . Back pain 04/19/2017  . Normocytic anemia 01/12/2017  . Fatty liver 11/03/2015  . Anxiety state 11/03/2015  . Weight loss 04/05/2011  . GERD 06/01/2010  . IRRITABLE BOWEL SYNDROME 07/21/2009   Ihor Austin, LPTA/CLT;  CBIS [phone]  Aldona Lento 06/25/2020, 3:22 PM  [facility] [address] [place], [place], [address] Phone: [phone]   Fax:  [phone]  Name: Courtney Rogers MRN: [id number] Date of Birth: [birth year]

## 2020-06-29 ENCOUNTER — Ambulatory Visit (HOSPITAL_COMMUNITY): Payer: Medicare Other | Admitting: Physical Therapy

## 2020-06-30 ENCOUNTER — Encounter: Payer: Medicare Other | Admitting: Family Medicine

## 2020-07-01 ENCOUNTER — Ambulatory Visit (HOSPITAL_COMMUNITY): Payer: Medicare Other

## 2020-07-02 ENCOUNTER — Ambulatory Visit (HOSPITAL_COMMUNITY): Payer: Medicare Other | Admitting: Physical Therapy

## 2020-07-02 ENCOUNTER — Other Ambulatory Visit: Payer: Self-pay

## 2020-07-02 ENCOUNTER — Encounter (HOSPITAL_COMMUNITY): Payer: Self-pay | Admitting: Physical Therapy

## 2020-07-02 DIAGNOSIS — R262 Difficulty in walking, not elsewhere classified: Secondary | ICD-10-CM | POA: Diagnosis not present

## 2020-07-02 DIAGNOSIS — M6281 Muscle weakness (generalized): Secondary | ICD-10-CM

## 2020-07-02 DIAGNOSIS — Z9181 History of falling: Secondary | ICD-10-CM

## 2020-07-02 NOTE — Patient Instructions (Signed)
Access Code: [email or web address] URL: [email or web address] Date: 07/02/2020 Prepared by: Josue Hector  Exercises Seated Scapular Retraction - 1-2 x daily - 7 x weekly - 1 sets - 10 reps - 5 seconds hold Heel rises with counter support - 1-2 x daily - 7 x weekly - 1 sets - 10 reps

## 2020-07-02 NOTE — Therapy (Signed)
[place], [place], [address] Phone: [phone]   Fax:  [phone]  Physical Therapy Treatment  Patient Details  Name: Courtney Rogers MRN: [id number] Date of Birth: 20-Apr-1951 Referring Provider (PT): Cherly Beach NP   Encounter Date: 07/02/2020   PT End of Session - 07/02/20 1458    Visit Number 11    Number of Visits 18    Date for PT Re-Evaluation 07/17/20    Authorization Type BCBS medicare primary, BCBS secondary, 40 VL, eval/recerts need to be sent off    Progress Note Due on Visit 16    PT Start Time 1448    PT Stop Time 1530    PT Time Calculation (min) 42 min    Equipment Utilized During Treatment Gait belt    Activity Tolerance Patient tolerated treatment well    Behavior During Therapy [facility] for tasks assessed/performed           Past Medical History:  Diagnosis Date  . Acute myocardial infarction [facility]) 2009   CAD/no stent medically managed  . Anxiety disorder   . CAD (coronary artery disease)   . Hyperlipidemia   . Hypertension   . IBS (irritable bowel syndrome)   . Non-ulcer dyspepsia 04/30/2009   Qualifier: Diagnosis of  By: Oneida Alar MD, Sandi L   . Overactive bladder   . PE (pulmonary embolism) JUN 2014  . Presence of permanent cardiac pacemaker   . Sleep apnea     Past Surgical History:  Procedure Laterality Date  . ABDOMINAL HYSTERECTOMY    . BSO secondary to cyst    . CARDIAC CATHETERIZATION    . COLONOSCOPY  12/2009   Dr. West Carbo, propofol, normal. Next TCS 12/2019  . COLONOSCOPY N/A 05/22/2017   Procedure: COLONOSCOPY;  Surgeon: Danie Binder, MD;  [facility];  Service: Endoscopy;  Laterality: N/A;  10:30am  . ESOPHAGOGASTRODUODENOSCOPY  05/11/09   schatzki ring/small hiatal hernia/path:gastritis  . ESOPHAGOGASTRODUODENOSCOPY N/A 05/22/2017   Procedure: ESOPHAGOGASTRODUODENOSCOPY (EGD);  Surgeon: Danie Binder, MD;  [facility];  Service:  Endoscopy;  Laterality: N/A;  . ESOPHAGOGASTRODUODENOSCOPY (EGD) WITH ESOPHAGEAL DILATION N/A 04/01/2013   [id number] at the gastroesophageal juction/multiple small polyps/mild gastritis  . GIVENS CAPSULE STUDY N/A 06/07/2017   Procedure: GIVENS CAPSULE STUDY;  Surgeon: Danie Binder, MD;  [facility];  Service: Endoscopy;  Laterality: N/A;  7:30am  . INSERT / REPLACE / REMOVE PACEMAKER     Last year per pt.(can't remember date)    There were no vitals filed for this visit.   Subjective Assessment - 07/02/20 1457    Subjective Patient says she is doing alright today. Her knee is a little stiff and sore today, "about a 3". Still having trouble with balance but no falls since last visit.    Currently in Pain? Yes    Pain Score 3     Pain Location Knee    Pain Orientation Right    Pain Descriptors / Indicators Sore;Tightness    Pain Type Chronic pain    Pain Onset More than a month ago    Pain Frequency Intermittent                             [facility] Adult PT Treatment/Exercise - 07/02/20 0001      Knee/Hip Exercises: Standing   Heel Raises Both;2 sets;10 reps    Hip Abduction  2 sets;10 reps    Hip Extension 2 sets;10 reps    Forward Step Up Right;Left;2 sets;10 reps;Hand Hold: 2;Hand Hold: 1;Step Height: 4"   1st set 2 HHA, 2nd set 1 HHA   Gait Training 150 with SPC, cues for heel strike and posture    Other Standing Knee Exercises tandem stance 2 x 30"    Other Standing Knee Exercises band rows RTB x 15 with cues for ab set and posture      Knee/Hip Exercises: Seated   Sit to Sand 10 reps;without UE support                    PT Short Term Goals - 06/05/20 1454      PT SHORT TERM GOAL #1   Title Patient will be independent in self management strategies to improve quality of life and functional outcomes.    Time 3    Period Weeks    Status On-going    Target Date 04/15/20      PT SHORT TERM GOAL #2   Title Patient will report  at least 25% improvement in overall symptoms and/or function to demonstrate improved functional mobility    Baseline 1/14: 50%    Time 3    Period Weeks    Status Achieved    Target Date 04/15/20      PT SHORT TERM GOAL #3   Title Patient will statically stand for 2 minutes without assistance to allow for improved functioning in ADLs.    Baseline 1/14: 1:25    Time 3    Period Weeks    Status On-going    Target Date 06/26/20             PT Long Term Goals - 06/05/20 1454      PT LONG TERM GOAL #1   Title Patient will be able to perform 5x sit to stand without use of upper extremiteis in less then 18 seconds to demonstrate improved functional strength.    Baseline 1/14 17.6 sec    Time 8    Period Weeks    Status Achieved      PT LONG TERM GOAL #2   Title Patient will be able to ambulate at least 226 feet in 2 minutes with required assistive device to demonstrate improved ambulatory mobility.    Baseline 1/14: 124 with SPC, now using RW. 226 ft in 3:37, with rollator 2:28 to do 226 ft.    Time 8    Period Weeks    Status On-going      PT LONG TERM GOAL #3   Title Patient will be able to stand on either leg for at least 20 seconds while looking straight ahead with no upper extremity assist to improve static balance.    Baseline 1/14: able to complete B with 1-2 finger assist on counter, without finger assist max time 10 sec without    Time 8    Period Weeks    Status On-going                 Plan - 07/02/20 1702    Clinical Impression Statement Patient tolerated session well today with no increased complaint of pain. Patient continues to require cues for upright posture and tends to stand with trunk flexed looking down at feet. Educated patient on important of avoiding this stance for prolonged periods as it is likely to contribute to back pain and balance issues. Performed gait training with SPC,  educated patient on use of cane of LT side to assist with RT knee  weakness. Educated patient on proper sequencing and heel strike for stride length. Added band rows with ab set for posturing. Patient issued HEP handout. Patient will continue to benefit from LE strength and core progressions as well as balance, to reduce pain and improve functional mobility.    Personal Factors and Comorbidities Comorbidity 2;Comorbidity 1;Comorbidity 3+    Comorbidities history of falls, chronic right knee pain, compression fracture in spine, HTN    Examination-Activity Limitations Locomotion Level;Transfers;Stand;Bend    Examination-Participation Restrictions Cleaning;Meal Prep;Shop    Stability/Clinical Decision Making Stable/Uncomplicated    Rehab Potential Fair    PT Frequency 2x / week    PT Duration 6 weeks    PT Treatment/Interventions ADLs/Self Care Home Management;Aquatic Therapy;Cryotherapy;Balance training;Therapeutic exercise;Therapeutic activities;Stair training;Functional mobility training;Gait training;DME Instruction;Neuromuscular re-education;Patient/family education;Manual techniques    PT Next Visit Plan Continue with balance on compliant surface, dynamic balance and posturing exercise. Add palloff in semi tandem    PT Home Exercise Plan 11/10:  bridge, seated LAQ, marching, sit to stands. 12/8: standing hip extension, adduction squeezes (seated). 12/16: sidestepping, standing hamstring curls; 1/26 hip abd and ext 2/10 scapular retraction (for posture)    Consulted and Agree with Plan of Care Patient           Patient will benefit from skilled therapeutic intervention in order to improve the following deficits and impairments:  Pain,Abnormal gait,Decreased knowledge of use of DME,Decreased balance,Decreased mobility,Decreased strength,Difficulty walking,Postural dysfunction  Visit Diagnosis: History of falling  Muscle weakness (generalized)  Difficulty in walking, not elsewhere classified     Problem List Patient Active Problem List   Diagnosis  Date Noted  . Easy bruising 04/30/2020  . Epistaxis 04/30/2020  . Trapezius muscle spasm 02/27/2020  . DDD (degenerative disc disease), cervical 01/02/2020  . Hoarseness of voice 11/27/2019  . 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  . Essential hypertension 12/14/2017  . Iron deficiency anemia 08/03/2017  . Dyspepsia   . Back pain 04/19/2017  . Normocytic anemia 01/12/2017  . Fatty liver 11/03/2015  . Anxiety state 11/03/2015  . Weight loss 04/05/2011  . GERD 06/01/2010  . IRRITABLE BOWEL SYNDROME 07/21/2009    5:10 PM, 07/02/20 Josue Hector PT DPT  Physical Therapist with [facility]  [phone] [place] [address] [place], [place], [address] Phone: [phone]   Fax:  [phone]  Name: PANSEY PINHEIRO MRN: [id number] Date of Birth: Mar 08, 1951

## 2020-07-06 ENCOUNTER — Encounter (HOSPITAL_COMMUNITY): Payer: Self-pay | Admitting: Physical Therapy

## 2020-07-06 ENCOUNTER — Ambulatory Visit (HOSPITAL_COMMUNITY): Payer: Medicare Other | Admitting: Physical Therapy

## 2020-07-06 ENCOUNTER — Other Ambulatory Visit: Payer: Self-pay

## 2020-07-06 DIAGNOSIS — R262 Difficulty in walking, not elsewhere classified: Secondary | ICD-10-CM

## 2020-07-06 DIAGNOSIS — Z9181 History of falling: Secondary | ICD-10-CM

## 2020-07-06 DIAGNOSIS — M6281 Muscle weakness (generalized): Secondary | ICD-10-CM

## 2020-07-06 NOTE — Therapy (Signed)
[place], [place], [address] Phone: [phone]   Fax:  [phone]  Physical Therapy Treatment  Patient Details  Name: Courtney Rogers MRN: [id number] Date of Birth: 1950-06-29 Referring Provider (PT): Cherly Beach NP   Encounter Date: 07/06/2020   PT End of Session - 07/06/20 1408    Visit Number 12    Number of Visits 18    Date for PT Re-Evaluation 07/17/20    Authorization Type BCBS medicare primary, BCBS secondary, 40 VL, eval/recerts need to be sent off    Progress Note Due on Visit 16    PT Start Time 1410   pt late to session   PT Stop Time 1440    PT Time Calculation (min) 30 min    Equipment Utilized During Treatment Gait belt    Activity Tolerance Patient tolerated treatment well    Behavior During Therapy [facility] for tasks assessed/performed           Past Medical History:  Diagnosis Date  . Acute myocardial infarction [facility]) 2009   CAD/no stent medically managed  . Anxiety disorder   . CAD (coronary artery disease)   . Hyperlipidemia   . Hypertension   . IBS (irritable bowel syndrome)   . Non-ulcer dyspepsia 04/30/2009   Qualifier: Diagnosis of  By: Oneida Alar MD, Sandi L   . Overactive bladder   . PE (pulmonary embolism) JUN 2014  . Presence of permanent cardiac pacemaker   . Sleep apnea     Past Surgical History:  Procedure Laterality Date  . ABDOMINAL HYSTERECTOMY    . BSO secondary to cyst    . CARDIAC CATHETERIZATION    . COLONOSCOPY  12/2009   Dr. West Carbo, propofol, normal. Next TCS 12/2019  . COLONOSCOPY N/A 05/22/2017   Procedure: COLONOSCOPY;  Surgeon: Danie Binder, MD;  [facility];  Service: Endoscopy;  Laterality: N/A;  10:30am  . ESOPHAGOGASTRODUODENOSCOPY  05/11/09   schatzki ring/small hiatal hernia/path:gastritis  . ESOPHAGOGASTRODUODENOSCOPY N/A 05/22/2017   Procedure: ESOPHAGOGASTRODUODENOSCOPY (EGD);  Surgeon: Danie Binder, MD;  [facility];  Service: Endoscopy;  Laterality: N/A;  . ESOPHAGOGASTRODUODENOSCOPY (EGD) WITH ESOPHAGEAL DILATION N/A 04/01/2013   [id number] at the gastroesophageal juction/multiple small polyps/mild gastritis  . GIVENS CAPSULE STUDY N/A 06/07/2017   Procedure: GIVENS CAPSULE STUDY;  Surgeon: Danie Binder, MD;  [facility];  Service: Endoscopy;  Laterality: N/A;  7:30am  . INSERT / REPLACE / REMOVE PACEMAKER     Last year per pt.(can't remember date)    There were no vitals filed for this visit.   Subjective Assessment - 07/06/20 1413    Subjective States she is feeling alright but her right knee is bothering her. States pain is 4/10 which is a little more then normal for her. States that she feels her balance is still a little off. States that she bought a new cane today.    Currently in Pain? Yes    Pain Score 4     Pain Location Knee    Pain Orientation Right    Pain Onset More than a month ago              [facility] PT Assessment - 07/06/20 0001      Assessment   Medical Diagnosis right upper trapezius spasm, fall     Referring Provider (PT) Cherly Beach NP  [place] Adult PT Treatment/Exercise - 07/06/20 0001      Knee/Hip Exercises: Standing   Hip Abduction Knee straight;AROM;Stengthening;Both;2 sets;10 reps   with 1# ankle weights on   Hip Extension AROM;Stengthening;Both;2 sets;10 reps;Knee straight   with 1# ankle weights on   Wall Squat 3 sets;5 reps    Wall Squat Limitations cues to bend at hips and knees    SLS x10 attempts on each leg    Other Standing Knee Exercises lateral stepping (cues to keep feet straight) - 2x4 laps of 15 feet bilaterally; retro stepping with 1# ankle weights on each leg 2x5 laps of 15 feet    Other Standing Knee Exercises tandem walking x6 of 20 feet                    PT Short Term Goals - 06/05/20 1454      PT SHORT TERM GOAL #1   Title Patient will be independent in self  management strategies to improve quality of life and functional outcomes.    Time 3    Period Weeks    Status On-going    Target Date 04/15/20      PT SHORT TERM GOAL #2   Title Patient will report at least 25% improvement in overall symptoms and/or function to demonstrate improved functional mobility    Baseline 1/14: 50%    Time 3    Period Weeks    Status Achieved    Target Date 04/15/20      PT SHORT TERM GOAL #3   Title Patient will statically stand for 2 minutes without assistance to allow for improved functioning in ADLs.    Baseline 1/14: 1:25    Time 3    Period Weeks    Status On-going    Target Date 06/26/20             PT Long Term Goals - 06/05/20 1454      PT LONG TERM GOAL #1   Title Patient will be able to perform 5x sit to stand without use of upper extremiteis in less then 18 seconds to demonstrate improved functional strength.    Baseline 1/14 17.6 sec    Time 8    Period Weeks    Status Achieved      PT LONG TERM GOAL #2   Title Patient will be able to ambulate at least 226 feet in 2 minutes with required assistive device to demonstrate improved ambulatory mobility.    Baseline 1/14: 124 with SPC, now using RW. 226 ft in 3:37, with rollator 2:28 to do 226 ft.    Time 8    Period Weeks    Status On-going      PT LONG TERM GOAL #3   Title Patient will be able to stand on either leg for at least 20 seconds while looking straight ahead with no upper extremity assist to improve static balance.    Baseline 1/14: able to complete B with 1-2 finger assist on counter, without finger assist max time 10 sec without    Time 8    Period Weeks    Status On-going                 Plan - 07/06/20 1409    Clinical Impression Statement Patient tends to ambulate with scissoring gait and requires verbal cues to keep feet wide to keep from tripping over her feet during exercises. Added 1 pound ankle weights to standing exercises and  this was tolerated well.  Fatigue noted but no increase in knee pain. Session limited secondary to patient's late arrival. Overall balance seems to be improving, will continue to focus on dynamic and static balance as tolerated.    Personal Factors and Comorbidities Comorbidity 2;Comorbidity 1;Comorbidity 3+    Comorbidities history of falls, chronic right knee pain, compression fracture in spine, HTN    Examination-Activity Limitations Locomotion Level;Transfers;Stand;Bend    Examination-Participation Restrictions Cleaning;Meal Prep;Shop    Stability/Clinical Decision Making Stable/Uncomplicated    Rehab Potential Fair    PT Frequency 2x / week    PT Duration 6 weeks    PT Treatment/Interventions ADLs/Self Care Home Management;Aquatic Therapy;Cryotherapy;Balance training;Therapeutic exercise;Therapeutic activities;Stair training;Functional mobility training;Gait training;DME Instruction;Neuromuscular re-education;Patient/family education;Manual techniques    PT Next Visit Plan Continue with balance on compliant surface, dynamic balance and posturing exercise. Add palloff in semi tandem    PT Home Exercise Plan 11/10:  bridge, seated LAQ, marching, sit to stands. 12/8: standing hip extension, adduction squeezes (seated). 12/16: sidestepping, standing hamstring curls; 1/26 hip abd and ext 2/10 scapular retraction (for posture)    Consulted and Agree with Plan of Care Patient           Patient will benefit from skilled therapeutic intervention in order to improve the following deficits and impairments:  Pain,Abnormal gait,Decreased knowledge of use of DME,Decreased balance,Decreased mobility,Decreased strength,Difficulty walking,Postural dysfunction  Visit Diagnosis: History of falling  Muscle weakness (generalized)  Difficulty in walking, not elsewhere classified     Problem List Patient Active Problem List   Diagnosis Date Noted  . Easy bruising 04/30/2020  . Epistaxis 04/30/2020  . Trapezius muscle spasm  02/27/2020  . DDD (degenerative disc disease), cervical 01/02/2020  . Hoarseness of voice 11/27/2019  . 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  . Essential hypertension 12/14/2017  . Iron deficiency anemia 08/03/2017  . Dyspepsia   . Back pain 04/19/2017  . Normocytic anemia 01/12/2017  . Fatty liver 11/03/2015  . Anxiety state 11/03/2015  . Weight loss 04/05/2011  . GERD 06/01/2010  . IRRITABLE BOWEL SYNDROME 07/21/2009    2:40 PM, 07/06/20 [name], DPT Physical Therapy with [facility]  [phone] office  [place] [address] [place], [place], [address] Phone: [phone]   Fax:  [phone]  Name: Courtney Rogers MRN: [id number] Date of Birth: 12/10/1950

## 2020-07-07 ENCOUNTER — Encounter: Payer: Medicare Other | Admitting: Nurse Practitioner

## 2020-07-09 ENCOUNTER — Ambulatory Visit: Payer: Medicare Other | Admitting: Neurology

## 2020-07-09 ENCOUNTER — Telehealth (HOSPITAL_COMMUNITY): Payer: Self-pay

## 2020-07-09 ENCOUNTER — Ambulatory Visit (HOSPITAL_COMMUNITY): Payer: Medicare Other

## 2020-07-09 NOTE — Telephone Encounter (Signed)
No show, called and left message concerning missed apt today.  Reminded next apt date and time.  Included no show policy details in message as well as contact number if needs to cancel/reschedule further apts.  Ihor Austin, LPTA/CLT; Delana Meyer [phone]

## 2020-07-13 ENCOUNTER — Ambulatory Visit (HOSPITAL_COMMUNITY): Payer: Medicare Other | Admitting: Physical Therapy

## 2020-07-14 ENCOUNTER — Other Ambulatory Visit: Payer: Self-pay

## 2020-07-14 ENCOUNTER — Ambulatory Visit (HOSPITAL_COMMUNITY): Payer: Medicare Other | Admitting: Physical Therapy

## 2020-07-14 ENCOUNTER — Encounter (HOSPITAL_COMMUNITY): Payer: Self-pay | Admitting: Physical Therapy

## 2020-07-14 DIAGNOSIS — R262 Difficulty in walking, not elsewhere classified: Secondary | ICD-10-CM | POA: Diagnosis not present

## 2020-07-14 DIAGNOSIS — Z9181 History of falling: Secondary | ICD-10-CM

## 2020-07-14 DIAGNOSIS — M6281 Muscle weakness (generalized): Secondary | ICD-10-CM

## 2020-07-14 NOTE — Therapy (Signed)
[facility] [address] [place], [place], [address] Phone: [phone]   Fax:  [phone]  Physical Therapy Treatment/Discharge Summary  Patient Details  Name: Courtney Rogers MRN: [id number] Date of Birth: [birth year] Referring Provider (PT): Cherly Beach NP   Encounter Date: 07/14/2020  PHYSICAL THERAPY DISCHARGE SUMMARY  Visits from Start of Care: 13  Current functional level related to goals / functional outcomes: See below   Remaining deficits: See below   Education / Equipment: See below  Plan: Patient agrees to discharge.  Patient goals were met. Patient is being discharged due to meeting the stated rehab goals.  ?????        PT End of Session - 07/14/20 1134    Visit Number 13    Number of Visits 18    Date for PT Re-Evaluation 07/17/20    Authorization Type BCBS medicare primary, BCBS secondary, 40 VL, eval/recerts need to be sent off    Authorization Time Period 12 visits approved from 06/18/20 to 09/15/20    Authorization - Visit Number 5    Authorization - Number of Visits 12    Progress Note Due on Visit 16    PT Start Time 1134    PT Stop Time 1202    PT Time Calculation (min) 28 min    Equipment Utilized During Treatment Gait belt    Activity Tolerance Patient tolerated treatment well    Behavior During Therapy WFL for tasks assessed/performed           Past Medical History:  Diagnosis Date  . Acute myocardial infarction [facility]) 2009   CAD/no stent medically managed  . Anxiety disorder   . CAD (coronary artery disease)   . Hyperlipidemia   . Hypertension   . IBS (irritable bowel syndrome)   . Non-ulcer dyspepsia 04/30/2009   Qualifier: Diagnosis of  By: Oneida Alar MD, Sandi L   . Overactive bladder   . PE (pulmonary embolism) JUN 2014  . Presence of permanent cardiac pacemaker   . Sleep apnea     Past Surgical History:  Procedure Laterality Date  . ABDOMINAL HYSTERECTOMY    . BSO secondary to  cyst    . CARDIAC CATHETERIZATION    . COLONOSCOPY  12/2009   Dr. West Carbo, propofol, normal. Next TCS 12/2019  . COLONOSCOPY N/A 05/22/2017   Procedure: COLONOSCOPY;  Surgeon: Danie Binder, MD;  [facility];  Service: Endoscopy;  Laterality: N/A;  10:30am  . ESOPHAGOGASTRODUODENOSCOPY  05/11/09   schatzki ring/small hiatal hernia/path:gastritis  . ESOPHAGOGASTRODUODENOSCOPY N/A 05/22/2017   Procedure: ESOPHAGOGASTRODUODENOSCOPY (EGD);  Surgeon: Danie Binder, MD;  [facility];  Service: Endoscopy;  Laterality: N/A;  . ESOPHAGOGASTRODUODENOSCOPY (EGD) WITH ESOPHAGEAL DILATION N/A 04/01/2013   [id number] at the gastroesophageal juction/multiple small polyps/mild gastritis  . GIVENS CAPSULE STUDY N/A 06/07/2017   Procedure: GIVENS CAPSULE STUDY;  Surgeon: Danie Binder, MD;  [facility];  Service: Endoscopy;  Laterality: N/A;  7:30am  . INSERT / REPLACE / REMOVE PACEMAKER     Last year per pt.(can't remember date)    There were no vitals filed for this visit.   Subjective Assessment - 07/14/20 1137    Subjective Patient states her home exercises are going well. She does her exercises most of the time but not always. Patient states 45% improvement with physical therapy intervention with greatest limitation with balance. She occasionally drops things and has trouble picking them up. She fell about  2.5 weeks ago.    Currently in Pain? Yes    Pain Score 4     Pain Location Knee    Pain Orientation Right    Pain Descriptors / Indicators Sore    Pain Type Chronic pain    Pain Onset More than a month ago              [facility] PT Assessment - 07/14/20 0001      Assessment   Medical Diagnosis right upper trapezius spasm, fall     Referring Provider (PT) Cherly Beach NP    Prior Therapy yes for neck and shoulders      Precautions   Precautions Fall      Restrictions   Weight Bearing Restrictions No      Balance Screen   Has the patient  fallen in the past 6 months Yes    How many times? 4    Has the patient had a decrease in activity level because of a fear of falling?  Yes    Is the patient reluctant to leave their home because of a fear of falling?  Yes      Prior Function   Level of Independence Independent with basic ADLs;Independent with community mobility with device      Cognition   Overall Cognitive Status Within Functional Limits for tasks assessed      Observation/Other Assessments   Observations slow, labored cadence with RW      Transfers   Five time sit to stand comments  15.38 seconds without UE use      Ambulation/Gait   Ambulation/Gait Yes    Ambulation/Gait Assistance 5: Supervision    Ambulation Distance (Feet) 250 Feet    Assistive device 4-wheeled walker    Gait Pattern Step-through pattern;Decreased step length - right;Decreased step length - left;Decreased hip/knee flexion - left;Decreased hip/knee flexion - right    Ambulation Surface Level;Indoor    Gait velocity decreased    Gait Comments 2MWT      Static Standing Balance   Static Standing Balance -  Activities  Single Leg Stance - Left Leg;Single Leg Stance - Right Leg;Tandam Stance - Right Leg;Tandam Stance - Left Leg    Static Standing - Comment/# of Minutes LLE 30 seconds, RLE 20 seconds, static standing >2 minutes                                 PT Education - 07/14/20 1136    Education Details patient educated on HEP, exercise mechanics, progress made, returning to PT if needed, importance of compliance with HEP    Person(s) Educated Patient    Methods Explanation;Demonstration    Comprehension Verbalized understanding;Returned demonstration            PT Short Term Goals - 07/14/20 1143      PT SHORT TERM GOAL #1   Title Patient will be independent in self management strategies to improve quality of life and functional outcomes.    Time 3    Period Weeks    Status Achieved    Target Date  04/15/20      PT SHORT TERM GOAL #2   Title Patient will report at least 25% improvement in overall symptoms and/or function to demonstrate improved functional mobility    Baseline 1/14: 50% 2/22 45%    Time 3    Period Weeks    Status Achieved  Target Date 04/15/20      PT SHORT TERM GOAL #3   Title Patient will statically stand for 2 minutes without assistance to allow for improved functioning in ADLs.    Baseline 1/14: 1:25 2/22 at least 2 minutes    Time 3    Period Weeks    Status Achieved    Target Date 06/26/20             PT Long Term Goals - 07/14/20 1151      PT LONG TERM GOAL #1   Title Patient will be able to perform 5x sit to stand without use of upper extremiteis in less then 18 seconds to demonstrate improved functional strength.    Baseline 1/14 17.6 sec 2/22 15 seconds    Time 8    Period Weeks    Status Achieved      PT LONG TERM GOAL #2   Title Patient will be able to ambulate at least 226 feet in 2 minutes with required assistive device to demonstrate improved ambulatory mobility.    Baseline 1/14: 124 with SPC, now using RW. 226 ft in 3:37, with rollator 2:28 to do 226 ft. 2/22 250 with RW    Time 8    Period Weeks    Status Achieved      PT LONG TERM GOAL #3   Title Patient will be able to stand on either leg for at least 20 seconds while looking straight ahead with no upper extremity assist to improve static balance.    Baseline 1/14: able to complete B with 1-2 finger assist on counter, without finger assist max time 10 sec without 2/22 20 seconds RLE, 30 seconds LLE    Time 8    Period Weeks    Status Achieved                 Plan - 07/14/20 1135    Clinical Impression Statement Patient has met all short and long term goals with improving symptoms, balance, gait, functional strength. Patient remains limited by balance and c/o R knee pain. Patient showing improvement in all objective measures from last progress report. Patient educated  on importance of using RW, compliance with HEP, and increased physical activity. Patient also educated on returning to PT should any further issues arise. Patient discharged from physical therapy at this time.    Personal Factors and Comorbidities Comorbidity 2;Comorbidity 1;Comorbidity 3+    Comorbidities history of falls, chronic right knee pain, compression fracture in spine, HTN    Examination-Activity Limitations Locomotion Level;Transfers;Stand;Bend    Examination-Participation Restrictions Cleaning;Meal Prep;Shop    Stability/Clinical Decision Making Stable/Uncomplicated    Rehab Potential Fair    PT Frequency --    PT Duration --    PT Treatment/Interventions ADLs/Self Care Home Management;Aquatic Therapy;Cryotherapy;Balance training;Therapeutic exercise;Therapeutic activities;Stair training;Functional mobility training;Gait training;DME Instruction;Neuromuscular re-education;Patient/family education;Manual techniques    PT Next Visit Plan n/a    PT Home Exercise Plan 11/10:  bridge, seated LAQ, marching, sit to stands. 12/8: standing hip extension, adduction squeezes (seated). 12/16: sidestepping, standing hamstring curls; 1/26 hip abd and ext 2/10 scapular retraction (for posture)    Consulted and Agree with Plan of Care Patient           Patient will benefit from skilled therapeutic intervention in order to improve the following deficits and impairments:  Pain,Abnormal gait,Decreased knowledge of use of DME,Decreased balance,Decreased mobility,Decreased strength,Difficulty walking,Postural dysfunction  Visit Diagnosis: History of falling  Muscle weakness (generalized)  Difficulty  in walking, not elsewhere classified     Problem List Patient Active Problem List   Diagnosis Date Noted  . Easy bruising 04/30/2020  . Epistaxis 04/30/2020  . Trapezius muscle spasm 02/27/2020  . DDD (degenerative disc disease), cervical 01/02/2020  . Hoarseness of voice 11/27/2019  . Plasma  cell disorder 06/26/2019  . Essential hypertension 12/14/2017  . Iron deficiency anemia 08/03/2017  . Dyspepsia   . Back pain 04/19/2017  . Normocytic anemia 01/12/2017  . Fatty liver 11/03/2015  . Anxiety state 11/03/2015  . Weight loss 04/05/2011  . GERD 06/01/2010  . IRRITABLE BOWEL SYNDROME 07/21/2009    12:12 PM, 07/14/20 Mearl Latin PT, DPT Physical Therapist at [facility] [place], [place], [address] Phone: [phone]   Fax:  [phone]  Name: Courtney Rogers MRN: [id number] Date of Birth: 1950-09-07

## 2020-07-15 ENCOUNTER — Ambulatory Visit (INDEPENDENT_AMBULATORY_CARE_PROVIDER_SITE_OTHER): Payer: Medicare Other | Admitting: Nurse Practitioner

## 2020-07-15 ENCOUNTER — Encounter: Payer: Self-pay | Admitting: Nurse Practitioner

## 2020-07-15 ENCOUNTER — Telehealth: Payer: Self-pay

## 2020-07-15 VITALS — BP 156/82 | HR 84 | Temp 98.6°F | Resp 20 | Ht 64.0 in | Wt 165.0 lb

## 2020-07-15 DIAGNOSIS — Z0000 Encounter for general adult medical examination without abnormal findings: Secondary | ICD-10-CM

## 2020-07-15 DIAGNOSIS — D508 Other iron deficiency anemias: Secondary | ICD-10-CM

## 2020-07-15 DIAGNOSIS — M25561 Pain in right knee: Secondary | ICD-10-CM | POA: Insufficient documentation

## 2020-07-15 DIAGNOSIS — Z23 Encounter for immunization: Secondary | ICD-10-CM | POA: Diagnosis not present

## 2020-07-15 DIAGNOSIS — M545 Low back pain, unspecified: Secondary | ICD-10-CM | POA: Diagnosis not present

## 2020-07-15 DIAGNOSIS — G8929 Other chronic pain: Secondary | ICD-10-CM

## 2020-07-15 DIAGNOSIS — I10 Essential (primary) hypertension: Secondary | ICD-10-CM | POA: Diagnosis not present

## 2020-07-15 DIAGNOSIS — M25562 Pain in left knee: Secondary | ICD-10-CM

## 2020-07-15 MED ORDER — AMLODIPINE BESYLATE 10 MG PO TABS
10.0000 mg | ORAL_TABLET | Freq: Every day | ORAL | 1 refills | Status: DC
Start: 2020-07-15 — End: 2021-12-14

## 2020-07-15 NOTE — Telephone Encounter (Signed)
Patient forgot to ask if she needs to be on potassium she states she was on it previously from Dr Mannie Stabile P# [phone]

## 2020-07-15 NOTE — Assessment & Plan Note (Signed)
-  followed by ortho -has upcoming knee surgery in March 2022

## 2020-07-15 NOTE — Assessment & Plan Note (Addendum)
-  BP elevated today -taking amlodipine, chlorthalidone, losartan, and metoprolol -INCREASE amlodipine to 10 mg daily

## 2020-07-15 NOTE — Assessment & Plan Note (Signed)
-  PE performed today

## 2020-07-15 NOTE — Progress Notes (Addendum)
Established Patient Office Visit  Subjective:  Patient ID: Courtney Rogers, female    DOB: 02/25/1951  Age: 70 y.o. MRN: [id number]  CC:  Chief Complaint  Patient presents with  . Annual Exam    HPI Courtney Rogers presents for physical exam. She has ongoing knee and back pain. She has upcoming knee surgery in March, and she will need surgical clearance for that. She sees cardiology in [place] who will perform EKG.  She needs a TDaP shot as well as pneumovax-23 today.  Past Medical History:  Diagnosis Date  . Acute myocardial infarction [facility]) 2009   CAD/no stent medically managed  . Anxiety disorder   . CAD (coronary artery disease)   . Hyperlipidemia   . Hypertension   . IBS (irritable bowel syndrome)   . Non-ulcer dyspepsia 04/30/2009   Qualifier: Diagnosis of  By: Oneida Alar MD, Sandi L   . Overactive bladder   . PE (pulmonary embolism) JUN 2014  . Presence of permanent cardiac pacemaker   . Sleep apnea     Past Surgical History:  Procedure Laterality Date  . ABDOMINAL HYSTERECTOMY    . BSO secondary to cyst    . CARDIAC CATHETERIZATION    . COLONOSCOPY  12/2009   Dr. West Carbo, propofol, normal. Next TCS 12/2019  . COLONOSCOPY N/A 05/22/2017   Procedure: COLONOSCOPY;  Surgeon: Danie Binder, MD;  [facility];  Service: Endoscopy;  Laterality: N/A;  10:30am  . ESOPHAGOGASTRODUODENOSCOPY  05/11/09   schatzki ring/small hiatal hernia/path:gastritis  . ESOPHAGOGASTRODUODENOSCOPY N/A 05/22/2017   Procedure: ESOPHAGOGASTRODUODENOSCOPY (EGD);  Surgeon: Danie Binder, MD;  [facility];  Service: Endoscopy;  Laterality: N/A;  . ESOPHAGOGASTRODUODENOSCOPY (EGD) WITH ESOPHAGEAL DILATION N/A 04/01/2013   [id number] at the gastroesophageal juction/multiple small polyps/mild gastritis  . GIVENS CAPSULE STUDY N/A 06/07/2017   Procedure: GIVENS CAPSULE STUDY;  Surgeon: Danie Binder, MD;  [facility];  Service: Endoscopy;  Laterality:  N/A;  7:30am  . INSERT / REPLACE / REMOVE PACEMAKER     Last year per pt.(can't remember date)    Family History  Problem Relation Age of Onset  . Colon polyps Neg Hx   . Colon cancer Neg Hx     Social History   Socioeconomic History  . Marital status: Married    Spouse name: [name]  . Number of children: 3  . Years of education: 71  . Highest education level: Not on file  Occupational History  . Not on file  Tobacco Use  . Smoking status: Never Smoker  . Smokeless tobacco: Never Used  . Tobacco comment: Never smoked   Vaping Use  . Vaping Use: Never used  Substance and Sexual Activity  . Alcohol use: No    Alcohol/week: 0.0 standard drinks  . Drug use: No  . Sexual activity: Not Currently  Other Topics Concern  . Not on file  Social History Narrative      Lives with husband-51 years 63 in Aug 2021   Daughter is close by, 2 sons live further away   One IN [place],ONE IN [place], [place].        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   Enjoys: reading, young adult      Diet: eats all food groups    Caffeine: coffee 1, tea daily soda-1 daily   Water: 1-2 cups      Wears seat belt    Does not use phone while  driving   Smoke detectors at home    Public house manager -locked up      Left handed   One story home   Drinks caffeine   Social Determinants of Health   Financial Resource Strain: Low Risk   . Difficulty of Paying Living Expenses: Not hard at all  Food Insecurity: No Food Insecurity  . Worried About Charity fundraiser in the Last Year: Never true  . Ran Out of Food in the Last Year: Never true  Transportation Needs: No Transportation Needs  . Lack of Transportation (Medical): No  . Lack of Transportation (Non-Medical): No  Physical Activity: Inactive  . Days of Exercise per Week: 0 days  . Minutes of Exercise per Session: 0 min  Stress: No Stress Concern Present  . Feeling of Stress : Not at all  Social Connections:  Moderately Integrated  . Frequency of Communication with Friends and Family: More than three times a week  . Frequency of Social Gatherings with Friends and Family: More than three times a week  . Attends Religious Services: More than 4 times per year  . Active Member of Clubs or Organizations: No  . Attends Archivist Meetings: Never  . Marital Status: Married  Human resources officer Violence: Not At Risk  . Fear of Current or Ex-Partner: No  . Emotionally Abused: No  . Physically Abused: No  . Sexually Abused: No    Outpatient Medications Prior to Visit  Medication Sig Dispense Refill  . acetaminophen (TYLENOL) 500 MG tablet Take 1,000 mg by mouth 2 (two) times daily as needed for moderate pain or headache.    . albuterol (PROVENTIL HFA;VENTOLIN HFA) 108 (90 BASE) MCG/ACT inhaler Inhale 2 puffs into the lungs every 6 (six) hours as needed for shortness of breath.    Marland Kitchen aspirin EC 81 MG tablet Take 81 mg by mouth at bedtime.    Marland Kitchen b complex vitamins tablet Take 1 tablet by mouth daily.    . busPIRone (BUSPAR) 5 MG tablet Take 5 mg by mouth 2 (two) times daily.    . Calcium Carbonate-Vitamin D (CALCIUM 600+D PO) Take 1 tablet by mouth 2 (two) times daily.    . chlorthalidone (HYGROTON) 25 MG tablet Take 25 mg by mouth daily.     . Cyanocobalamin (B-12) 2500 MCG TABS Take 1 capsule by mouth daily.    . diclofenac (CATAFLAM) 50 MG tablet Take 50 mg by mouth 2 (two) times daily.    . fluticasone (FLONASE) 50 MCG/ACT nasal spray SPRAY 2 SPRAYS INTO EACH NOSTRIL ONCE DAILY 16 g 0  . folic acid (FOLVITE) 1 MG tablet TAKE 1 TABLET BY MOUTH ONCE DAILY. 30 tablet 11  . gabapentin (NEURONTIN) 600 MG tablet Take 600 mg by mouth 2 (two) times daily.     Marland Kitchen ipratropium-albuterol (DUONEB) 0.5-2.5 (3) MG/3ML SOLN Supposed to be doing 4 times daily,  She has been doing as needed    . lidocaine (XYLOCAINE) 2 % solution TAKE 2 TEASPOONFULS BEFORE MEALS AND AT BEDTIME AS NEEDED MAY REPEAT EVERY 4 HOURS.  (MAX OF 8 DOSES PER DAY) 300 mL 5  . losartan (COZAAR) 50 MG tablet Take 50 mg by mouth 2 (two) times daily.    . metoprolol succinate (TOPROL-XL) 50 MG 24 hr tablet Take 75 mg by mouth daily.     . montelukast (SINGULAIR) 10 MG tablet Take 10 mg by mouth daily.     . nitroGLYCERIN (NITROSTAT) 0.4  MG SL tablet Place 0.4 mg under the tongue every 5 (five) minutes as needed for chest pain.     . potassium chloride (K-DUR,KLOR-CON) 10 MEQ tablet Take 40 mEq by mouth 2 (two) times daily.     . Probiotic Product (PROBIOTIC PO) Take 1 capsule by mouth daily. [facility].    . sertraline (ZOLOFT) 100 MG tablet Take 200 mg by mouth daily.     . Simethicone (GAS-X PO) Take 2 tablets by mouth daily as needed (gas).    . simvastatin (ZOCOR) 40 MG tablet Take 40 mg by mouth at bedtime.    . triamcinolone cream (KENALOG) 0.1 % Apply 1 application topically as needed.     . vitamin C (ASCORBIC ACID) 500 MG tablet Take 500 mg by mouth daily.    Marland Kitchen warfarin (COUMADIN) 2.5 MG tablet Take 2.5 mg by mouth daily.    Marland Kitchen warfarin (COUMADIN) 5 MG tablet Take 5 mg by mouth at bedtime. Mon -Thurs   5 mg and Fri -Sun  2.5 mg    . amLODipine (NORVASC) 5 MG tablet Take 10 mg by mouth daily.     No facility-administered medications prior to visit.    Allergies  Allergen Reactions  . Biaxin [Clarithromycin] Other (See Comments)    Stomach problems   . Erythromycin   . Lisinopril Swelling    ROS Review of Systems  Constitutional: Negative.   HENT: Negative.   Eyes: Negative.   Respiratory: Negative.   Cardiovascular: Negative.   Gastrointestinal: Negative.   Endocrine: Negative.   Genitourinary: Negative.   Musculoskeletal: Positive for arthralgias and back pain.  Skin: Negative.   Allergic/Immunologic: Negative.   Neurological: Negative.   Hematological: Negative.   Psychiatric/Behavioral: Negative.       Objective:    Physical Exam Constitutional:      Appearance: Normal appearance.   HENT:     Head: Normocephalic and atraumatic.     Right Ear: Tympanic membrane, ear canal and external ear normal.     Left Ear: Tympanic membrane, ear canal and external ear normal.     Nose: Nose normal.     Mouth/Throat:     Mouth: Mucous membranes are moist.     Pharynx: Oropharynx is clear.  Eyes:     Extraocular Movements: Extraocular movements intact.     Conjunctiva/sclera: Conjunctivae normal.     Pupils: Pupils are equal, round, and reactive to light.  Cardiovascular:     Rate and Rhythm: Normal rate and regular rhythm.     Pulses: Normal pulses.     Heart sounds: Normal heart sounds.  Pulmonary:     Effort: Pulmonary effort is normal.     Breath sounds: Normal breath sounds.  Abdominal:     General: Abdomen is flat. Bowel sounds are normal.     Palpations: Abdomen is soft.  Musculoskeletal:     Cervical back: Normal range of motion and neck supple.     Comments: Uses walker for ambulation; able to walk across room and get on exam table well without walker  Skin:    General: Skin is warm and dry.     Capillary Refill: Capillary refill takes less than 2 seconds.  Neurological:     General: No focal deficit present.     Mental Status: She is alert and oriented to person, place, and time.  Psychiatric:        Mood and Affect: Mood normal.  Behavior: Behavior normal.        Thought Content: Thought content normal.        Judgment: Judgment normal.     BP (!) 156/82   Pulse 84   Temp 98.6 F (37 C)   Resp 20   Ht _0  (1.626 m)   Wt 165 lb (74.8 kg)   SpO2 98%   BMI 28.32 kg/m  Wt Readings from Last 3 Encounters:  07/15/20 165 lb (74.8 kg)  06/15/20 154 lb (69.9 kg)  04/30/20 161 lb (73 kg)     Health Maintenance Due  Topic Date Due  . PNA vac Low Risk Adult (2 of 2 - PPSV23) 04/12/2019  . TETANUS/TDAP  01/19/2020    There are no preventive care reminders to display for this patient.  Lab Results  Component Value Date   TSH 0.644  05/02/2018   Lab Results  Component Value Date   WBC 6.1 04/02/2020   HGB 10.4 (L) 04/02/2020   HCT 33.0 (L) 04/02/2020   MCV 100.9 (H) 04/02/2020   PLT 103 (L) 04/02/2020   Lab Results  Component Value Date   NA 138 04/02/2020   K 3.7 04/02/2020   CO2 27 04/02/2020   GLUCOSE 92 04/02/2020   BUN 12 04/02/2020   CREATININE 0.79 04/02/2020   BILITOT 0.7 03/04/2020   ALKPHOS 65 03/04/2020   AST 32 03/04/2020   ALT 21 03/04/2020   PROT 9.7 (H) 03/04/2020   ALBUMIN 3.6 03/04/2020   CALCIUM 9.4 04/02/2020   ANIONGAP 13 04/02/2020   No results found for: CHOL No results found for: HDL No results found for: LDLCALC No results found for: TRIG No results found for: CHOLHDL No results found for: HGBA1C    Assessment & Plan:   Problem List Items Addressed This Visit      Cardiovascular and Mediastinum   Essential hypertension    -BP elevated today -taking amlodipine, chlorthalidone, losartan, and metoprolol -INCREASE amlodipine to 10 mg daily      Relevant Medications   amLODipine (NORVASC) 10 MG tablet     Other   Back pain    -followed by ortho, will defer to them and she has upcoming appointment -she states she still has back pain -taking tylenol and diclofenac      Iron deficiency anemia    -hx of GI blood loss -will check labs today -last IV iron was March 2019 -Hx. Of pulmonary embolism, taking warfarin; monitored by cardiology in [facility]      Bilateral knee pain    -followed by ortho -has upcoming knee surgery in March 2022      Immunization due    -TDAP and pneumovax-23 today      Encounter for preventative adult health care examination - Primary    -PE performed today      Relevant Orders   CBC with Differential/Platelet   CMP14+EGFR   Lipid Panel With LDL/HDL Ratio      Meds ordered this encounter  Medications  . amLODipine (NORVASC) 10 MG tablet    Sig: Take 1 tablet (10 mg total) by mouth daily.    Dispense:  90 tablet     Refill:  1    Follow-up: Return in about 4 months (around 11/12/2020) for Lab follow-up.    Noreene Larsson, NP

## 2020-07-15 NOTE — Assessment & Plan Note (Signed)
-  hx of GI blood loss -will check labs today -last IV iron was March 2019 -Hx. Of pulmonary embolism, taking warfarin; monitored by cardiology in [place]

## 2020-07-15 NOTE — Assessment & Plan Note (Signed)
-  TDAP and pneumovax-23 today

## 2020-07-15 NOTE — Addendum Note (Signed)
Addended by: Lonn Georgia on: 07/15/2020 11:21 AM   Modules accepted: Orders

## 2020-07-15 NOTE — Telephone Encounter (Signed)
PT INFORMED WE WILL WAIT ON RESULTS FROM BLOOD TESTS TODAY TO SEE WHAT POTASSIUM IS.

## 2020-07-15 NOTE — Assessment & Plan Note (Signed)
-  followed by ortho, will defer to them and she has upcoming appointment -she states she still has back pain -taking tylenol and diclofenac

## 2020-07-16 ENCOUNTER — Ambulatory Visit (HOSPITAL_COMMUNITY): Payer: Medicare Other

## 2020-07-16 LAB — CBC WITH DIFFERENTIAL/PLATELET
Basophils Absolute: 0 10*3/uL (ref 0.0–0.2)
Basos: 1 %
EOS (ABSOLUTE): 0.2 10*3/uL (ref 0.0–0.4)
Eos: 4 %
Hematocrit: 30.7 % — ABNORMAL LOW (ref 34.0–46.6)
Hemoglobin: 9.8 g/dL — ABNORMAL LOW (ref 11.1–15.9)
Immature Grans (Abs): 0 10*3/uL (ref 0.0–0.1)
Immature Granulocytes: 1 %
Lymphocytes Absolute: 1.8 10*3/uL (ref 0.7–3.1)
Lymphs: 31 %
MCH: 30.4 pg (ref 26.6–33.0)
MCHC: 31.9 g/dL (ref 31.5–35.7)
MCV: 95 fL (ref 79–97)
Monocytes Absolute: 0.4 10*3/uL (ref 0.1–0.9)
Monocytes: 7 %
Neutrophils Absolute: 3.4 10*3/uL (ref 1.4–7.0)
Neutrophils: 56 %
Platelets: 117 10*3/uL — ABNORMAL LOW (ref 150–450)
RBC: 3.22 x10E6/uL — ABNORMAL LOW (ref 3.77–5.28)
RDW: 14.5 % (ref 11.7–15.4)
WBC: 5.9 10*3/uL (ref 3.4–10.8)

## 2020-07-16 LAB — CMP14+EGFR
ALT: 14 IU/L (ref 0–32)
AST: 26 IU/L (ref 0–40)
Albumin/Globulin Ratio: 0.7 — ABNORMAL LOW (ref 1.2–2.2)
Albumin: 4.1 g/dL (ref 3.7–4.7)
Alkaline Phosphatase: 89 IU/L (ref 44–121)
BUN/Creatinine Ratio: 16 (ref 12–28)
BUN: 14 mg/dL (ref 8–27)
Bilirubin Total: 0.4 mg/dL (ref 0.0–1.2)
CO2: 24 mmol/L (ref 20–29)
Calcium: 9.5 mg/dL (ref 8.7–10.3)
Chloride: 100 mmol/L (ref 96–106)
Creatinine, Ser: 0.85 mg/dL (ref 0.57–1.00)
GFR calc Af Amer: 80 mL/min/{1.73_m2} (ref >59)
GFR calc non Af Amer: 69 mL/min/{1.73_m2} (ref >59)
Globulin, Total: 5.9 g/dL — ABNORMAL HIGH (ref 1.5–4.5)
Glucose: 86 mg/dL (ref 65–99)
Potassium: 3.9 mmol/L (ref 3.5–5.2)
Sodium: 141 mmol/L (ref 134–144)
Total Protein: 10 g/dL — ABNORMAL HIGH (ref 6.0–8.5)

## 2020-07-16 LAB — LIPID PANEL WITH LDL/HDL RATIO
Cholesterol, Total: 151 mg/dL (ref 100–199)
HDL: 46 mg/dL (ref >39)
LDL Chol Calc (NIH): 82 mg/dL (ref 0–99)
LDL/HDL Ratio: 1.8 ratio (ref 0.0–3.2)
Triglycerides: 131 mg/dL (ref 0–149)
VLDL Cholesterol Cal: 23 mg/dL (ref 5–40)

## 2020-07-16 NOTE — Progress Notes (Signed)
She has anemia, so I am adding an iron panel. We can call her when the results come back from the iron panel.

## 2020-07-17 LAB — FERRITIN: Ferritin: 317 ng/mL — ABNORMAL HIGH (ref 15–150)

## 2020-07-17 LAB — SPECIMEN STATUS REPORT

## 2020-07-17 LAB — IRON AND TIBC
Iron Saturation: 30 % (ref 15–55)
Iron: 78 ug/dL (ref 27–139)
Total Iron Binding Capacity: 262 ug/dL (ref 250–450)
UIBC: 184 ug/dL (ref 118–369)

## 2020-07-17 NOTE — Progress Notes (Signed)
Her iron levels are ok. Her anemia is stable when compared to previous labs, but she should keep her appointment with Dr. Raliegh Ip to monitor her blood work.

## 2020-07-20 ENCOUNTER — Ambulatory Visit (HOSPITAL_COMMUNITY): Payer: Medicare Other | Admitting: Physical Therapy

## 2020-07-20 ENCOUNTER — Telehealth: Payer: Self-pay

## 2020-07-20 NOTE — Telephone Encounter (Signed)
Pt called and wanted to speak with you. Please advise.

## 2020-07-20 NOTE — Telephone Encounter (Signed)
Left message

## 2020-07-24 NOTE — Telephone Encounter (Signed)
Left message for pt to call back at her convenience.

## 2020-07-27 ENCOUNTER — Telehealth: Payer: Self-pay

## 2020-07-27 NOTE — Telephone Encounter (Signed)
Tammy from the surgical center called and would like to know since pt was just seen 2/23 for CPE if you would be able to do a surgical clearance for her based on this visit or if she needs a separate visit for this. If not all they need is OV notes and her recent labs faxed to them, with some type of documentation (letter) stating she is physically cleared for her R total knee replacement.

## 2020-07-27 NOTE — Telephone Encounter (Signed)
Information faxed and Tammy informed.

## 2020-07-27 NOTE — Telephone Encounter (Signed)
Yeah. I think we discussed this as we were doing the physical. Her labs were good, so she is surgically cleared. We can send the notes without doing a new visit.  She had anemia and chronically low platelet count, but the surgeon will review that.

## 2020-07-29 ENCOUNTER — Encounter: Payer: Self-pay | Admitting: Nurse Practitioner

## 2020-07-29 ENCOUNTER — Other Ambulatory Visit: Payer: Self-pay

## 2020-07-29 ENCOUNTER — Ambulatory Visit (INDEPENDENT_AMBULATORY_CARE_PROVIDER_SITE_OTHER): Payer: Medicare Other | Admitting: Nurse Practitioner

## 2020-07-29 VITALS — BP 144/78 | HR 80 | Temp 96.8°F | Ht 64.0 in | Wt 167.2 lb

## 2020-07-29 DIAGNOSIS — R634 Abnormal weight loss: Secondary | ICD-10-CM | POA: Diagnosis not present

## 2020-07-29 DIAGNOSIS — K581 Irritable bowel syndrome with constipation: Secondary | ICD-10-CM | POA: Diagnosis not present

## 2020-07-29 DIAGNOSIS — K219 Gastro-esophageal reflux disease without esophagitis: Secondary | ICD-10-CM

## 2020-07-29 DIAGNOSIS — R109 Unspecified abdominal pain: Secondary | ICD-10-CM | POA: Insufficient documentation

## 2020-07-29 NOTE — Progress Notes (Signed)
Referring Provider: Perlie Mayo, NP Primary Care Physician:  Perlie Mayo, NP Primary GI:  Dr. Abbey Chatters  Chief Complaint  Patient presents with  . Constipation  . Gastroesophageal Reflux    "some"  . Abdominal Pain    Right lower    HPI:   Courtney Rogers is a 70 y.o. female who presents for follow-up.  The patient was last seen in our office 03/17/2020 for IBS, GERD, weight loss.  Previous abdominal pain fairly well controlled, historically does use milk of magnesia for constipation.  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  At that time noted to be doing okay.  Noted recent fracture of L1 and wearing a brace, myeloma labs in October 2021 with worsening, he had CT scan 04/07/2020 with no evidence of active multiple myeloma on PET scan and no evidence of lytic infection on the CT portion.  No evidence of soft tissue plasmacytoma.  Normocytic anemia with hemoglobin stable at 9.4.  History of pulmonary embolism in 2015 and remains on Coumadin.  Recommended follow-up in 3 to 4 months.  At her last visit that she fell recently with abdominal pain since then if she twists or bends.  Feels like a musculoskeletal etiology, per her assessment.  Occasional constipation and takes milk of magnesia which is effective.  Planned right knee arthroplasty in September 2021 was delayed due to fall/back injury.  Noted weight loss of 20 pounds in 3 to 4 months objectively due to poor appetite after the fall, objectively down 17 pounds in the past 6 months.  Notes food does not taste good anymore.  Has never used in supplements before (Ensure causes diarrhea).  Recommended continue current medications, topical therapy for rib/musculoskeletal pain, remember to "eat to live", supplements for weight stabilization with recommendations provided, follow-up in 3 months.  Today she states she is doing okay overall. She is having RLQ abdominal pain, pain is the same  since her fall a couple months ago; associated with back pain as well. She's brought this up to PCP and hasn't had any recommendations yet. Constipation is doing ok with Milk of Magnesia. GERD doing better even without PPI or H2RB. She does take a Probiotic. She was seeing PT through 2/24 for fall. Denies abdominal pain, N/V, hematochezia, melena, fever, chills. States she had been losing weight but started using supplement (store brand Ensure) and her appetite has improved and weight increased (subjectively); Objectively her weight is up about 10 lbs. Denies URI or flu-like symptoms. Denies loss of sense of taste or smell. The patient has received COVID-19 vaccination(s). Denies chest pain, dyspnea, dizziness, lightheadedness, syncope, near syncope. Denies any other upper or lower GI symptoms.  Past Medical History:  Diagnosis Date  . Acute myocardial infarction [facility]) 2009   CAD/no stent medically managed  . Anxiety disorder   . CAD (coronary artery disease)   . Hyperlipidemia   . Hypertension   . IBS (irritable bowel syndrome)   . Non-ulcer dyspepsia 04/30/2009   Qualifier: Diagnosis of  By: Oneida Alar MD, Sandi L   . Overactive bladder   . PE (pulmonary embolism) JUN 2014  . Presence of permanent cardiac pacemaker   . Sleep apnea     Past Surgical History:  Procedure Laterality Date  . ABDOMINAL HYSTERECTOMY    . BSO secondary to cyst    . CARDIAC CATHETERIZATION    . COLONOSCOPY  12/2009   Dr. West Carbo, propofol, normal. Next  TCS 12/2019  . COLONOSCOPY N/A 05/22/2017   Procedure: COLONOSCOPY;  Surgeon: Danie Binder, MD;  [facility];  Service: Endoscopy;  Laterality: N/A;  10:30am  . ESOPHAGOGASTRODUODENOSCOPY  05/11/09   schatzki ring/small hiatal hernia/path:gastritis  . ESOPHAGOGASTRODUODENOSCOPY N/A 05/22/2017   Procedure: ESOPHAGOGASTRODUODENOSCOPY (EGD);  Surgeon: Danie Binder, MD;  Location: [facility];  Service: Endoscopy;  Laterality: N/A;  .  ESOPHAGOGASTRODUODENOSCOPY (EGD) WITH ESOPHAGEAL DILATION N/A 04/01/2013   DEY:[id number] at the gastroesophageal juction/multiple small polyps/mild gastritis  . GIVENS CAPSULE STUDY N/A 06/07/2017   Procedure: GIVENS CAPSULE STUDY;  Surgeon: Danie Binder, MD;  Location: [facility];  Service: Endoscopy;  Laterality: N/A;  7:30am  . INSERT / REPLACE / REMOVE PACEMAKER     Last year per pt.(can't remember date)    Current Outpatient Medications  Medication Sig Dispense Refill  . acetaminophen (TYLENOL) 500 MG tablet Take 1,000 mg by mouth 2 (two) times daily as needed for moderate pain or headache.    . albuterol (PROVENTIL HFA;VENTOLIN HFA) 108 (90 BASE) MCG/ACT inhaler Inhale 2 puffs into the lungs every 6 (six) hours as needed for shortness of breath.    Marland Kitchen amLODipine (NORVASC) 10 MG tablet Take 1 tablet (10 mg total) by mouth daily. 90 tablet 1  . aspirin EC 81 MG tablet Take 81 mg by mouth at bedtime.    Marland Kitchen b complex vitamins tablet Take 1 tablet by mouth daily.    . busPIRone (BUSPAR) 5 MG tablet Take 10 mg by mouth daily.    . Calcium Carbonate-Vitamin D (CALCIUM 600+D PO) Take 1 tablet by mouth 2 (two) times daily.    . chlorthalidone (HYGROTON) 25 MG tablet Take 25 mg by mouth daily.     . Cyanocobalamin (B-12) 2500 MCG TABS Take 1 capsule by mouth daily.    . diclofenac (CATAFLAM) 50 MG tablet Take 50 mg by mouth as needed.    . fluticasone (FLONASE) 50 MCG/ACT nasal spray SPRAY 2 SPRAYS INTO EACH NOSTRIL ONCE DAILY 16 g 0  . folic acid (FOLVITE) 1 MG tablet TAKE 1 TABLET BY MOUTH ONCE DAILY. 30 tablet 11  . gabapentin (NEURONTIN) 600 MG tablet Take 600 mg by mouth 2 (two) times daily.     Marland Kitchen ipratropium-albuterol (DUONEB) 0.5-2.5 (3) MG/3ML SOLN Supposed to be doing 4 times daily,  She has been doing as needed    . lidocaine (XYLOCAINE) 2 % solution TAKE 2 TEASPOONFULS BEFORE MEALS AND AT BEDTIME AS NEEDED MAY REPEAT EVERY 4 HOURS. (MAX OF 8 DOSES PER DAY) 300 mL 5  . losartan  (COZAAR) 50 MG tablet Take 100 mg by mouth daily.    . Magnesium Hydroxide (MILK OF MAGNESIA PO) Take by mouth as needed.    . metoprolol succinate (TOPROL-XL) 50 MG 24 hr tablet Take 75 mg by mouth daily.     . montelukast (SINGULAIR) 10 MG tablet Take 10 mg by mouth daily.     . nitroGLYCERIN (NITROSTAT) 0.4 MG SL tablet Place 0.4 mg under the tongue every 5 (five) minutes as needed for chest pain.     . potassium chloride (K-DUR,KLOR-CON) 10 MEQ tablet Take 40 mEq by mouth 2 (two) times daily.     . Probiotic Product (PROBIOTIC PO) Take 1 capsule by mouth daily. [facility].    . sertraline (ZOLOFT) 100 MG tablet Take 200 mg by mouth daily.     . Simethicone (GAS-X PO) Take 2 tablets by mouth daily  as needed (gas).    . simvastatin (ZOCOR) 40 MG tablet Take 40 mg by mouth at bedtime.    . triamcinolone cream (KENALOG) 0.1 % Apply 1 application topically as needed.     . vitamin C (ASCORBIC ACID) 500 MG tablet Take 500 mg by mouth daily.    Marland Kitchen warfarin (COUMADIN) 5 MG tablet Take 2.5-5 mg by mouth daily. Mon -Fri   5 mg and Sat -Sun  2.5 mg     No current facility-administered medications for this visit.    Allergies as of 07/29/2020 - Review Complete 07/29/2020  Allergen Reaction Noted  . Biaxin [clarithromycin] Other (See Comments)   . Erythromycin  04/02/2020  . Lisinopril Swelling     Family History  Problem Relation Age of Onset  . Colon polyps Neg Hx   . Colon cancer Neg Hx     Social History   Socioeconomic History  . Marital status: Married    Spouse name: [name]  . Number of children: 3  . Years of education: 4  . Highest education level: Not on file  Occupational History  . Not on file  Tobacco Use  . Smoking status: Never Smoker  . Smokeless tobacco: Never Used  . Tobacco comment: Never smoked   Vaping Use  . Vaping Use: Never used  Substance and Sexual Activity  . Alcohol use: Yes    Alcohol/week: 0.0 standard drinks    Comment: occ wine  .  Drug use: No  . Sexual activity: Not Currently  Other Topics Concern  . Not on file  Social History Narrative      Lives with husband-51 years 1 in Aug 2021   Daughter is close by, 2 sons live further away   One IN [place],ONE IN [place], [place].        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   Enjoys: reading, young adult      Diet: eats all food groups    Caffeine: coffee 1, tea daily soda-1 daily   Water: 1-2 cups      Wears seat belt    Does not use phone while driving   Smoke detectors at home    Public house manager -locked up      Left handed   One story home   Drinks caffeine   Social Determinants of Health   Financial Resource Strain: Low Risk   . Difficulty of Paying Living Expenses: Not hard at all  Food Insecurity: No Food Insecurity  . Worried About Charity fundraiser in the Last Year: Never true  . Ran Out of Food in the Last Year: Never true  Transportation Needs: No Transportation Needs  . Lack of Transportation (Medical): No  . Lack of Transportation (Non-Medical): No  Physical Activity: Inactive  . Days of Exercise per Week: 0 days  . Minutes of Exercise per Session: 0 min  Stress: No Stress Concern Present  . Feeling of Stress : Not at all  Social Connections: Moderately Integrated  . Frequency of Communication with Friends and Family: More than three times a week  . Frequency of Social Gatherings with Friends and Family: More than three times a week  . Attends Religious Services: More than 4 times per year  . Active Member of Clubs or Organizations: No  . Attends Archivist Meetings: Never  . Marital Status: Married    Subjective: Review of Systems  Constitutional: Negative for chills, fever, malaise/fatigue  and weight loss.  HENT: Negative for congestion and sore throat.   Respiratory: Negative for cough and shortness of breath.   Cardiovascular: Negative for chest pain and palpitations.   Gastrointestinal: Positive for abdominal pain (Right side, associated with back pain since fall) and constipation (Relieved by MoM). Negative for blood in stool, diarrhea, heartburn, melena, nausea and vomiting.  Musculoskeletal: Positive for back pain. Negative for joint pain and myalgias.  Skin: Negative for rash.  Neurological: Negative for dizziness and weakness.  Endo/Heme/Allergies: Does not bruise/bleed easily.  Psychiatric/Behavioral: Negative for depression. The patient is not nervous/anxious.   All other systems reviewed and are negative.    Objective: BP (!) 144/78   Pulse 80   Temp (!) 96.8 F (36 C) (Temporal)   Ht '5\' 4"'  (1.626 m)   Wt 167 lb 3.2 oz (75.8 kg)   BMI 28.70 kg/m  Physical Exam Vitals and nursing note reviewed.  Constitutional:      General: She is not in acute distress.    Appearance: Normal appearance. She is well-developed and normal weight. She is not ill-appearing, toxic-appearing or diaphoretic.  HENT:     Head: Normocephalic and atraumatic.     Nose: No congestion or rhinorrhea.  Eyes:     General: No scleral icterus. Cardiovascular:     Rate and Rhythm: Normal rate and regular rhythm.     Heart sounds: Normal heart sounds.  Pulmonary:     Effort: Pulmonary effort is normal. No respiratory distress.     Breath sounds: Normal breath sounds.  Abdominal:     General: Bowel sounds are normal.     Palpations: Abdomen is soft. There is no hepatomegaly, splenomegaly or mass.     Tenderness: There is no abdominal tenderness. There is no guarding or rebound.     Hernia: No hernia is present.  Skin:    General: Skin is warm and dry.     Coloration: Skin is not jaundiced.     Findings: No rash.  Neurological:     General: No focal deficit present.     Mental Status: She is alert and oriented to person, place, and time.  Psychiatric:        Attention and Perception: Attention normal.        Mood and Affect: Mood normal.        Speech: Speech  normal.        Behavior: Behavior normal.        Thought Content: Thought content normal.        Cognition and Memory: Cognition and memory normal.      Assessment:  Very pleasant 70 year old female presents to follow-up on IBS, GERD, weight loss, abdominal pain.  No red flag/warning signs or symptoms.  Overall she seems to be doing stable.  GERD: Doing well, generally without symptoms.  She does have viscous lidocaine as needed.  Not on PPI or H2 receptor blocker.  No symptoms to justify a new prescription.  Recommend she monitor and let us know for any worsening  IBS constipation type: She manages her constipation quite well with milk of magnesia that she takes as needed for constipation.  She will sometimes have lower abdominal discomfort with constipation but this quickly resolves after a bowel movement.  Recommend she continue her current medications that seem to be effective for her.  Notify us of worsening symptoms  Weight loss: She is drinking Ensure like supplement.  Her appetite is improved and she has put on  about 10 pounds over the past several months.  Recommend she continue eating a nutritious diet to maintain her weight and nutrition.  Abdominal pain: This is less true abdominal pain and more right-sided pain associated with back pain.  This pain is unchanged, ongoing since her fall as documented in her last visit.  Likely musculoskeletal etiology.  She is just finished with physical therapy.  Recent CT/PET scan reassuring.  I recommended she follow-up with primary care for management for likely musculoskeletal injury.   Plan: 1. Continue current medications 2. Follow-up PCP about side/back pain 3. Follow-up in 6 months 4. Call us for worsening or severe symptoms    Thank you for allowing [place] to participate in the care of Dola Argyle, DNP, AGNP-C Adult & Gerontological Nurse Practitioner [facility]   07/29/2020 4:10  PM   Disclaimer: This note was dictated with voice recognition software. Similar sounding words can inadvertently be transcribed and may not be corrected upon review.

## 2020-07-29 NOTE — Patient Instructions (Addendum)
Your health issues we discussed today were:   GERD (reflux/heartburn): 1. Enteritis is doing well for you 2. Some point 3. Continue to avoid triggers that make your reflux worse 4. Call us if you have any worsening or severe symptoms  Irritable bowel syndrome (IBS) constipation type with lower abdominal discomfort: 1. Glad you are doing well! 2. Continue to use milk of magnesia as needed for constipation 3. Let us know if you have any worsening constipation or abdominal pain  Weight loss: 1. As we discussed, your weight is stable/has increased recently 2. Continue to monitor your weight let us know if you have any ongoing or returning weight loss  Side/back pain after fall: 1. As we discussed, your CT scan of your abdomen and the stability of your symptoms is reassuring that you do not have any significant GI problem 2. Discussed with primary care possible muscle/bone injury that they may be able to help you manage  Overall I recommend:  1. Continue your current medications 2. Return for follow-up in 6 months 3. Call us for any questions or concerns   ---------------------------------------------------------------  I am glad you have gotten your COVID-19 vaccination!  Even though you are fully vaccinated you should continue to follow CDC and state/local guidelines.  ---------------------------------------------------------------   At [facility] we value your feedback. You may receive a survey about your visit today. Please share your experience as we strive to create trusting relationships with our patients to provide genuine, compassionate, quality care.  We appreciate your understanding and patience as we review any laboratory studies, imaging, and other diagnostic tests that are ordered as we care for you. Our office policy is 5 business days for review of these results, and any emergent or urgent results are addressed in a timely manner for your best interest.  If you do not hear from our office in 1 week, please contact us.   We also encourage the use of MyChart, which contains your medical information for your review as well. If you are not enrolled in this feature, an access code is on this after visit summary for your convenience. Thank you for allowing [place] to be involved in your care.  It was great to see you today!  I hope you have a great spring!!

## 2020-07-29 NOTE — Progress Notes (Signed)
Cc'ed to pcp °

## 2020-08-12 ENCOUNTER — Other Ambulatory Visit: Payer: Self-pay

## 2020-08-12 ENCOUNTER — Inpatient Hospital Stay (HOSPITAL_COMMUNITY): Payer: Medicare Other | Attending: Hematology

## 2020-08-12 DIAGNOSIS — G8929 Other chronic pain: Secondary | ICD-10-CM | POA: Diagnosis not present

## 2020-08-12 DIAGNOSIS — M25561 Pain in right knee: Secondary | ICD-10-CM | POA: Insufficient documentation

## 2020-08-12 DIAGNOSIS — M545 Low back pain, unspecified: Secondary | ICD-10-CM | POA: Diagnosis not present

## 2020-08-12 DIAGNOSIS — Z7901 Long term (current) use of anticoagulants: Secondary | ICD-10-CM | POA: Insufficient documentation

## 2020-08-12 DIAGNOSIS — C9000 Multiple myeloma not having achieved remission: Secondary | ICD-10-CM | POA: Diagnosis present

## 2020-08-12 DIAGNOSIS — D472 Monoclonal gammopathy: Secondary | ICD-10-CM

## 2020-08-12 DIAGNOSIS — Z86711 Personal history of pulmonary embolism: Secondary | ICD-10-CM | POA: Diagnosis not present

## 2020-08-12 DIAGNOSIS — D649 Anemia, unspecified: Secondary | ICD-10-CM | POA: Insufficient documentation

## 2020-08-12 LAB — COMPREHENSIVE METABOLIC PANEL
ALT: 14 U/L (ref 0–44)
AST: 26 U/L (ref 15–41)
Albumin: 3.5 g/dL (ref 3.5–5.0)
Alkaline Phosphatase: 70 U/L (ref 38–126)
Anion gap: 11 (ref 5–15)
BUN: 18 mg/dL (ref 8–23)
CO2: 27 mmol/L (ref 22–32)
Calcium: 9.5 mg/dL (ref 8.9–10.3)
Chloride: 102 mmol/L (ref 98–111)
Creatinine, Ser: 0.82 mg/dL (ref 0.44–1.00)
GFR, Estimated: >60 mL/min (ref >60)
Glucose, Bld: 92 mg/dL (ref 70–99)
Potassium: 3.8 mmol/L (ref 3.5–5.1)
Sodium: 140 mmol/L (ref 135–145)
Total Bilirubin: 0.5 mg/dL (ref 0.3–1.2)
Total Protein: 9.9 g/dL — ABNORMAL HIGH (ref 6.5–8.1)

## 2020-08-12 LAB — IRON AND TIBC
Iron: 59 ug/dL (ref 28–170)
Saturation Ratios: 19 % (ref 10.4–31.8)
TIBC: 303 ug/dL (ref 250–450)
UIBC: 244 ug/dL

## 2020-08-12 LAB — CBC WITH DIFFERENTIAL/PLATELET
Abs Immature Granulocytes: 0.03 10*3/uL (ref 0.00–0.07)
Basophils Absolute: 0 10*3/uL (ref 0.0–0.1)
Basophils Relative: 0 %
Eosinophils Absolute: 0.3 10*3/uL (ref 0.0–0.5)
Eosinophils Relative: 5 %
HCT: 32.3 % — ABNORMAL LOW (ref 36.0–46.0)
Hemoglobin: 10.1 g/dL — ABNORMAL LOW (ref 12.0–15.0)
Immature Granulocytes: 1 %
Lymphocytes Relative: 32 %
Lymphs Abs: 1.8 10*3/uL (ref 0.7–4.0)
MCH: 31 pg (ref 26.0–34.0)
MCHC: 31.3 g/dL (ref 30.0–36.0)
MCV: 99.1 fL (ref 80.0–100.0)
Monocytes Absolute: 0.5 10*3/uL (ref 0.1–1.0)
Monocytes Relative: 9 %
Neutro Abs: 3 10*3/uL (ref 1.7–7.7)
Neutrophils Relative %: 53 %
Platelets: 118 10*3/uL — ABNORMAL LOW (ref 150–400)
RBC: 3.26 MIL/uL — ABNORMAL LOW (ref 3.87–5.11)
RDW: 15.9 % — ABNORMAL HIGH (ref 11.5–15.5)
WBC: 5.7 10*3/uL (ref 4.0–10.5)
nRBC: 0 % (ref 0.0–0.2)

## 2020-08-12 LAB — FERRITIN: Ferritin: 176 ng/mL (ref 11–307)

## 2020-08-13 LAB — PROTEIN ELECTROPHORESIS, SERUM
A/G Ratio: 0.7 (ref 0.7–1.7)
Albumin ELP: 4.1 g/dL (ref 2.9–4.4)
Alpha-1-Globulin: 0.2 g/dL (ref 0.0–0.4)
Alpha-2-Globulin: 0.6 g/dL (ref 0.4–1.0)
Beta Globulin: 1.2 g/dL (ref 0.7–1.3)
Gamma Globulin: 4.2 g/dL — ABNORMAL HIGH (ref 0.4–1.8)
Globulin, Total: 6.3 g/dL — ABNORMAL HIGH (ref 2.2–3.9)
M-Spike, %: 3.1 g/dL — ABNORMAL HIGH
Total Protein ELP: 10.4 g/dL — ABNORMAL HIGH (ref 6.0–8.5)

## 2020-08-13 LAB — KAPPA/LAMBDA LIGHT CHAINS
Kappa free light chain: 26.3 mg/L — ABNORMAL HIGH (ref 3.3–19.4)
Kappa, lambda light chain ratio: 9.07 — ABNORMAL HIGH (ref 0.26–1.65)
Lambda free light chains: 2.9 mg/L — ABNORMAL LOW (ref 5.7–26.3)

## 2020-08-19 ENCOUNTER — Inpatient Hospital Stay (HOSPITAL_BASED_OUTPATIENT_CLINIC_OR_DEPARTMENT_OTHER): Payer: Medicare Other | Admitting: Hematology

## 2020-08-19 ENCOUNTER — Other Ambulatory Visit: Payer: Self-pay

## 2020-08-19 VITALS — BP 115/76 | HR 69 | Temp 97.9°F | Resp 17 | Wt 159.2 lb

## 2020-08-19 DIAGNOSIS — D472 Monoclonal gammopathy: Secondary | ICD-10-CM

## 2020-08-19 DIAGNOSIS — C9000 Multiple myeloma not having achieved remission: Secondary | ICD-10-CM

## 2020-08-19 NOTE — Progress Notes (Signed)
Courtney Rogers, [place] [address]   CLINIC:  Medical Oncology/Hematology  PCP:  Courtney Mayo, NP [place] / [place] [address]  [phone]  REASON FOR VISIT:  Follow-up for smoldering myeloma  PRIOR THERAPY: None  CURRENT THERAPY: Surveillance  INTERVAL HISTORY:  Courtney Rogers, a 70 y.o. female, returns for routine follow-up for her smoldering myeloma. Courtney Rogers was last seen on 04/14/2020.  Today she reports feeling good. She continues having right knee pain and chronic lower back pain, but denies having any new bone pain; she wears her back brace when her back hurts. She started having looser BM's since the end of last week, but denies having watery diarrhea or recent antibiotic use. She continues taking Coumadin, though she has stopped temporarily in preparation for her surgery. She denies having any more fractures recently.  She is scheduled to have her right knee replacement on 04/05 with Dr. Chauncey Rogers.   REVIEW OF SYSTEMS:  Review of Systems  Constitutional: Positive for fatigue (25%). Negative for appetite change.  Gastrointestinal: Positive for diarrhea (loose BM's).  Musculoskeletal: Positive for arthralgias (5/10 R knee pain) and back pain (chronic stable lower back pain).  All other systems reviewed and are negative.   PAST MEDICAL/SURGICAL HISTORY:  Past Medical History:  Diagnosis Date  . Acute myocardial infarction [facility]) 2009   CAD/no stent medically managed  . Anxiety disorder   . CAD (coronary artery disease)   . Hyperlipidemia   . Hypertension   . IBS (irritable bowel syndrome)   . Non-ulcer dyspepsia 04/30/2009   Qualifier: Diagnosis of  By: Courtney Courtney Rogers   . Overactive bladder   . PE (pulmonary embolism) JUN 2014  . Presence of permanent cardiac pacemaker   . Sleep apnea    Past Surgical History:  Procedure Laterality Date  . ABDOMINAL HYSTERECTOMY    . BSO secondary to cyst    . CARDIAC  CATHETERIZATION    . COLONOSCOPY  12/2009   Dr. [place] Rogers, propofol, normal. Next TCS 12/2019  . COLONOSCOPY N/A 05/22/2017   Procedure: COLONOSCOPY;  Surgeon: Courtney Binder, MD;  [facility];  Service: Endoscopy;  Laterality: N/A;  10:30am  . ESOPHAGOGASTRODUODENOSCOPY  05/11/09   schatzki ring/small hiatal hernia/path:gastritis  . ESOPHAGOGASTRODUODENOSCOPY N/A 05/22/2017   Procedure: ESOPHAGOGASTRODUODENOSCOPY (EGD);  Surgeon: Courtney Binder, MD;  [facility];  Service: Endoscopy;  Laterality: N/A;  . ESOPHAGOGASTRODUODENOSCOPY (EGD) WITH ESOPHAGEAL DILATION N/A 04/01/2013   GGY:IRSWNIOEV at the gastroesophageal juction/multiple small polyps/mild gastritis  . GIVENS CAPSULE STUDY N/A 06/07/2017   Procedure: GIVENS CAPSULE STUDY;  Surgeon: Courtney Binder, MD;  [facility];  Service: Endoscopy;  Laterality: N/A;  7:30am  . INSERT / REPLACE / REMOVE PACEMAKER     Last year per pt.(can't remember date)    SOCIAL HISTORY:  Social History   Socioeconomic History  . Marital status: Married    Spouse name: Courtney Rogers  . Number of children: 3  . Years of education: 31  . Highest education level: Not on file  Occupational History  . Not on file  Tobacco Use  . Smoking status: Never Smoker  . Smokeless tobacco: Never Used  . Tobacco comment: Never smoked   Vaping Use  . Vaping Use: Never used  Substance and Sexual Activity  . Alcohol use: Yes    Alcohol/week: 0.0 standard drinks    Comment: occ wine  . Drug use:  No  . Sexual activity: Not Currently  Other Topics Concern  . Not on file  Social History Narrative      Lives with husband-51 years 56 in Aug 2021   Daughter is close by, 2 sons live further away   One IN [place],ONE IN [place], [place].        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   Enjoys: Rogers, young adult      Diet: eats all food groups    Caffeine: coffee 1, tea daily soda-1 daily   Water: 1-2 cups       Wears seat belt    Does not use phone while driving   Smoke detectors at home    Public house manager -locked up      Left handed   One story home   Drinks caffeine   Social Determinants of Health   Financial Resource Strain: Low Risk   . Difficulty of Paying Living Expenses: Not hard at all  Food Insecurity: No Food Insecurity  . Worried About Charity fundraiser in the Last Year: Never true  . Ran Out of Food in the Last Year: Never true  Transportation Needs: No Transportation Needs  . Lack of Transportation (Medical): No  . Lack of Transportation (Non-Medical): No  Physical Activity: Inactive  . Days of Exercise per Week: 0 days  . Minutes of Exercise per Session: 0 min  Stress: No Stress Concern Present  . Feeling of Stress : Not at all  Social Connections: Moderately Integrated  . Frequency of Communication with Friends and Family: More than three times a week  . Frequency of Social Gatherings with Friends and Family: More than three times a week  . Attends Religious Services: More than 4 times per year  . Active Member of Clubs or Organizations: No  . Attends Archivist Meetings: Never  . Marital Status: Married  Human resources officer Violence: Not At Risk  . Fear of Current or Ex-Partner: No  . Emotionally Abused: No  . Physically Abused: No  . Sexually Abused: No    FAMILY HISTORY:  Family History  Problem Relation Age of Onset  . Colon polyps Neg Hx   . Colon cancer Neg Hx     CURRENT MEDICATIONS:  Current Outpatient Medications  Medication Sig Dispense Refill  . acetaminophen (TYLENOL) 500 MG tablet Take 1,000 mg by mouth 2 (two) times daily as needed for moderate pain or headache.    . albuterol (PROVENTIL HFA;VENTOLIN HFA) 108 (90 BASE) MCG/ACT inhaler Inhale 2 puffs into the lungs every 6 (six) hours as needed for shortness of breath.    Marland Kitchen amLODipine (NORVASC) 10 MG tablet Take 1 tablet (10 mg total) by mouth daily. 90 tablet 1  .  aspirin 81 MG EC tablet 1 tablet    . aspirin EC 81 MG tablet Take 81 mg by mouth at bedtime.    Marland Kitchen b complex vitamins tablet Take 1 tablet by mouth daily.    . busPIRone (BUSPAR) 5 MG tablet Take 10 mg by mouth daily.    . busPIRone (BUSPAR) 5 MG tablet 1 tablet    . Calcium Carbonate-Vitamin D (CALCIUM 600+D PO) Take 1 tablet by mouth 2 (two) times daily.    . Calcium Carbonate-Vitamin D 600-200 MG-UNIT CAPS     . chlorthalidone (HYGROTON) 25 MG tablet Take 25 mg by mouth daily.     . Cyanocobalamin (B-12) 2500 MCG TABS Take 1  capsule by mouth daily.    . diclofenac (CATAFLAM) 50 MG tablet Take 50 mg by mouth as needed.    . fexofenadine (ALLEGRA) 180 MG tablet 1 tablet as needed    . fluticasone (FLONASE) 50 MCG/ACT nasal spray SPRAY 2 SPRAYS INTO EACH NOSTRIL ONCE DAILY 16 g 0  . Fluticasone Furoate (ARNUITY ELLIPTA) 50 MCG/ACT AEPB     . folic acid (FOLVITE) 1 MG tablet TAKE 1 TABLET BY MOUTH ONCE DAILY. 30 tablet 11  . gabapentin (NEURONTIN) 600 MG tablet Take 600 mg by mouth 2 (two) times daily.     Marland Kitchen HYDROcodone-acetaminophen (NORCO/VICODIN) 5-325 MG tablet take 1-2  tablet by oral route  every 4 - 6 hours as needed for pain    . hydrOXYzine (ATARAX/VISTARIL) 50 MG tablet 1 tablet as needed    . ipratropium-albuterol (DUONEB) 0.5-2.5 (3) MG/3ML SOLN Supposed to be doing 4 times daily,  She has been doing as needed    . lidocaine (XYLOCAINE) 2 % solution TAKE 2 TEASPOONFULS BEFORE MEALS AND AT BEDTIME AS NEEDED MAY REPEAT EVERY 4 HOURS. (MAX OF 8 DOSES PER DAY) 300 mL 5  . Lidocaine HCl (XOLIDO) 2 % CREA     . losartan (COZAAR) 50 MG tablet Take 100 mg by mouth daily.    . Magnesium Hydroxide (MILK OF MAGNESIA PO) Take by mouth as needed.    . metoprolol succinate (TOPROL-XL) 50 MG 24 hr tablet Take 75 mg by mouth daily.     . metoprolol tartrate (LOPRESSOR) 50 MG tablet 1.5 tablet with food    . montelukast (SINGULAIR) 10 MG tablet Take 10 mg by mouth daily.     . nitroGLYCERIN  (NITROSTAT) 0.4 MG SL tablet Place 0.4 mg under the tongue every 5 (five) minutes as needed for chest pain.     Marland Kitchen olmesartan (BENICAR) 5 MG tablet Take by mouth.    . pantoprazole (PROTONIX) 40 MG tablet Take 40 mg by mouth daily.    . potassium chloride (K-DUR,KLOR-CON) 10 MEQ tablet Take 40 mEq by mouth 2 (two) times daily.     . potassium chloride (KLOR-CON) 10 MEQ tablet Take by mouth.    . Probiotic Product (PROBIOTIC COLON SUPPORT) CAPS See admin instructions.    . Probiotic Product (PROBIOTIC PO) Take 1 capsule by mouth daily. [facility].    . sertraline (ZOLOFT) 100 MG tablet Take 200 mg by mouth daily.     . Simethicone (GAS-X PO) Take 2 tablets by mouth daily as needed (gas).    . simvastatin (ZOCOR) 40 MG tablet Take 40 mg by mouth at bedtime.    . triamcinolone cream (KENALOG) 0.1 % Apply 1 application topically as needed.     . vitamin B-12 (CYANOCOBALAMIN) 100 MCG tablet     . vitamin C (ASCORBIC ACID) 500 MG tablet Take 500 mg by mouth daily.    Marland Kitchen warfarin (COUMADIN) 2.5 MG tablet Take 1 tab on Saturdays and Sundays    . warfarin (COUMADIN) 5 MG tablet Take 2.5-5 mg by mouth daily. Mon -Fri   5 mg and Sat -Sun  2.5 mg     No current facility-administered medications for this visit.    ALLERGIES:  Allergies  Allergen Reactions  . Biaxin [Clarithromycin] Other (See Comments)    Stomach problems   . Erythromycin   . Lisinopril Swelling    PHYSICAL EXAM:  Performance status (ECOG): 1 - Symptomatic but completely ambulatory  Vitals:   08/19/20 1149  BP: 115/76  Pulse: 69  Resp: 17  Temp: 97.9 F (36.6 C)  SpO2: 100%   Wt Readings from Last 3 Encounters:  08/19/20 159 lb 4 oz (72.2 kg)  07/29/20 167 lb 3.2 oz (75.8 kg)  07/15/20 165 lb (74.8 kg)   Physical Exam Vitals reviewed.  Constitutional:      Appearance: Normal appearance.     Comments: With rollator  Cardiovascular:     Rate and Rhythm: Normal rate and regular rhythm.     Pulses: Normal  pulses.     Heart sounds: Normal heart sounds.  Pulmonary:     Effort: Pulmonary effort is normal.     Breath sounds: Normal breath sounds.  Musculoskeletal:     Thoracic back: Bony tenderness present.     Lumbar back: Bony tenderness present.     Comments: Back brace  Neurological:     General: No focal deficit present.     Mental Status: She is alert and oriented to person, place, and time.  Psychiatric:        Mood and Affect: Mood normal.        Behavior: Behavior normal.     LABORATORY DATA:  I have reviewed the labs as listed.  CBC Latest Ref Rng & Units 08/12/2020 07/15/2020 04/02/2020  WBC 4.0 - 10.5 K/uL 5.7 5.9 6.1  Hemoglobin 12.0 - 15.0 g/dL 10.1(Rogers) 9.8(Rogers) 10.4(Rogers)  Hematocrit 36.0 - 46.0 % 32.3(Rogers) 30.7(Rogers) 33.0(Rogers)  Platelets 150 - 400 K/uL 118(Rogers) 117(Rogers) 103(Rogers)   CMP Latest Ref Rng & Units 08/12/2020 07/15/2020 04/02/2020  Glucose 70 - 99 mg/dL 92 86 92  BUN 8 - 23 mg/dL _0 Creatinine 0.44 - 1.00 mg/dL 0.82 0.85 0.79  Sodium 135 - 145 mmol/Rogers 140 141 138  Potassium 3.5 - 5.1 mmol/Rogers 3.8 3.9 3.7  Chloride 98 - 111 mmol/Rogers 102 100 98  CO2 22 - 32 mmol/Rogers _1 Calcium 8.9 - 10.3 mg/dL 9.5 9.5 9.4  Total Protein 6.5 - 8.1 g/dL 9.9(H) 10.0(H) -  Total Bilirubin 0.3 - 1.2 mg/dL 0.5 0.4 -  Alkaline Phos 38 - 126 U/Rogers 70 89 -  AST 15 - 41 U/Rogers 26 26 -  ALT 0 - 44 U/Rogers 14 14 -      Component Value Date/Time   RBC 3.26 (Rogers) 08/12/2020 1008   MCV 99.1 08/12/2020 1008   MCV 95 07/15/2020 1113   MCH 31.0 08/12/2020 1008   MCHC 31.3 08/12/2020 1008   RDW 15.9 (H) 08/12/2020 1008   RDW 14.5 07/15/2020 1113   LYMPHSABS 1.8 08/12/2020 1008   LYMPHSABS 1.8 07/15/2020 1113   MONOABS 0.5 08/12/2020 1008   EOSABS 0.3 08/12/2020 1008   EOSABS 0.2 07/15/2020 1113   BASOSABS 0.0 08/12/2020 1008   BASOSABS 0.0 07/15/2020 1113   Lab Results  Component Value Date   TIBC 303 08/12/2020   TIBC 262 07/15/2020   TIBC 304 03/04/2020   FERRITIN 176 08/12/2020   FERRITIN 317  (H) 07/15/2020   FERRITIN 510 (H) 03/04/2020   IRONPCTSAT 19 08/12/2020   IRONPCTSAT 30 07/15/2020   IRONPCTSAT 24 03/04/2020   Lab Results  Component Value Date   TOTALPROTELP 10.4 (H) 08/12/2020   ALBUMINELP 4.1 08/12/2020   A1GS 0.2 08/12/2020   A2GS 0.6 08/12/2020   BETS 1.2 08/12/2020   GAMS 4.2 (H) 08/12/2020   MSPIKE 3.1 (H) 08/12/2020   SPEI Comment 08/12/2020   Lab Results  Component Value Date  KPAFRELGTCHN 26.3 (H) 08/12/2020   LAMBDASER 2.9 (Rogers) 08/12/2020   KAPLAMBRATIO 9.07 (H) 08/12/2020    DIAGNOSTIC IMAGING:  I have independently reviewed the scans and discussed with the patient. No results found.   ASSESSMENT:  1. IgA kappa smoldering myeloma: -BMBX on 07/09/2019 shows hypercellular marrow for age with trilineage hematopoiesis. Increased number of atypical plasma cells present 36% of all cell lines. Plasma cells are kappa light chain restricted. -Unfortunately her specimen has reached cytogenetics and FISH lab week later due to bad weather and no viable plasma cells. -PET scan on 07/09/2019 showed no findings of active myeloma. -24-hour urine shows total protein 59 mg. Urine immunofixation shows IgA kappa monoclonal protein. LDH is 184. -Based on [facility] criteria she has high risk with about 45-50% probability of progression to myeloma in the next 2 years. -CTAP on 01/07/2020 shows acute superior endplate burst fracture of L1. Mild associated paravertebral soft tissue thickening. No other bone abnormalities. -Bone density test on 02/07/2020 shows T score -0.5.   PLAN:  1. IgA kappa smoldering myeloma: -Last PET scan on 04/07/2020 with no evidence of multiple myeloma. -She had fracture of L1 and is wearing a brace.  Bone density was normal.  Low back pain is stable. -Reviewed myeloma labs from 08/12/2020.  M spike increased to 3.1 from 2.8 previously.  Free light chain ratio increased to 9.07 from 7.7.  This has been progressively increasing.  Kappa  light chains are 26, up from 21.  Creatinine is normal.  Calcium was normal. -She is having knee replacement surgery by Dr. Chauncey Rogers in [place] next Monday. -I have recommended close follow-up based on recent change in labs.  RTC 2 months with repeat labs.  We will also do skeletal survey.  2.Normocytic anemia: -Hemoglobin on 08/13/2018 with 2 shows hemoglobin 10.1 MCV 99.1.  Ferritin is 176% saturation of 19.  3. Pulmonary embolism: -She had PE in 2015 and has been on Coumadin since then.  This was recently stopped for the upcoming knee surgery.  Orders placed this encounter:  No orders of the defined types were placed in this encounter.    Derek Jack, MD [place] [phone]   I, Milinda Antis, am acting as a scribe for Dr. Sanda Linger.  I, Derek Jack MD, have reviewed the above documentation for accuracy and completeness, and I agree with the above.

## 2020-08-19 NOTE — Patient Instructions (Signed)
[place] at [facility] Discharge Instructions  You were seen today by Dr. Delton Coombes. He went over your recent results. You may proceed with your surgery on April 5th. You will be scheduled to have a skeletal survey done before your next visit.  Dr. Delton Coombes will see you back in 2 months for labs and follow up.   Thank you for choosing [place] at [facility] to provide your oncology and hematology care.  To afford each patient quality time with our provider, please arrive at least 15 minutes before your scheduled appointment time.   If you have a lab appointment with the [facility] please come in thru the Main Entrance and check in at the main information desk  You need to re-schedule your appointment should you arrive 10 or more minutes late.  We strive to give you quality time with our providers, and arriving late affects you and other patients whose appointments are after yours.  Also, if you no show three or more times for appointments you may be dismissed from the clinic at the providers discretion.     Again, thank you for choosing [facility].  Our hope is that these requests will decrease the amount of time that you wait before being seen by our physicians.       _____________________________________________________________  Should you have questions after your visit to [facility], please contact our office at [phone] between the hours of 8:00 a.m. and 4:30 p.m.  Voicemails left after 4:00 p.m. will not be returned until the following business day.  For prescription refill requests, have your pharmacy contact our office and allow 72 hours.    Cancer Center Support Programs:   > Cancer Support Group  2nd Tuesday of the month 1pm-2pm, Journey Room

## 2020-08-21 HISTORY — PX: REPLACEMENT TOTAL KNEE: SUR1224

## 2020-09-02 ENCOUNTER — Telehealth: Payer: Self-pay | Admitting: Family Medicine

## 2020-09-02 NOTE — Telephone Encounter (Signed)
Left message for patient to call back and schedule Medicare Annual Wellness Visit (AWV) either virtually or in office.   AWV-I PER [place] 01/22/2015 please schedule at anytime with [facility]  health coach  This should be a 40 minute visit.

## 2020-09-28 ENCOUNTER — Other Ambulatory Visit: Payer: Self-pay | Admitting: Gastroenterology

## 2020-09-29 MED ORDER — LIDOCAINE VISCOUS HCL 2 % MT SOLN: OROMUCOSAL | 0 refills | Status: DC

## 2020-09-29 NOTE — Addendum Note (Signed)
Addended by: Annitta Needs on: 09/29/2020 02:16 PM   Modules accepted: Orders

## 2020-09-29 NOTE — Telephone Encounter (Signed)
Hey Anna The pharmacy phoned regarding this pt, the refills she had for Lidocaine expired. (pt had been in a nursing facility for rehab.) can you please send in a new script. (pt only seen Dr. Fields and Eric) 

## 2020-09-29 NOTE — Telephone Encounter (Signed)
Completed.

## 2020-09-29 NOTE — Telephone Encounter (Signed)
Courtney Rogers The pharmacy phoned regarding this pt, the refills she had for Lidocaine expired. (pt had been in a nursing facility for rehab.) can you please send in a new script. (pt only seen Dr. Oneida Alar and Randall Hiss)

## 2020-10-01 ENCOUNTER — Encounter: Payer: Self-pay | Admitting: Nurse Practitioner

## 2020-10-01 ENCOUNTER — Ambulatory Visit (INDEPENDENT_AMBULATORY_CARE_PROVIDER_SITE_OTHER): Payer: Medicare Other | Admitting: Nurse Practitioner

## 2020-10-01 ENCOUNTER — Other Ambulatory Visit: Payer: Self-pay

## 2020-10-01 DIAGNOSIS — I10 Essential (primary) hypertension: Secondary | ICD-10-CM

## 2020-10-01 DIAGNOSIS — M7918 Myalgia, other site: Secondary | ICD-10-CM

## 2020-10-01 NOTE — Progress Notes (Signed)
Acute Office Visit  Subjective:    Patient ID: Courtney Rogers, female    DOB: [birth year], 70 y.o.   MRN: [id number]  Chief Complaint  Patient presents with  . Sore    Sore on buttocks, painful x 2 weeks.   . Knee Pain    Follow up since TKR - R side. Procedure on 08/25/2020     HPI Patient is in today for follow-up after knee surgery.  She was scheduled for right knee surgery with Dr. Chauncey Reading in [place] in April. She  Had this completed, and she is seeing PT now. Incision looks great.  She states she has a pain in her buttocks that comes and goes. It hurts when she sits down, and she has some discomfort and burning when she changes positions.      Past Medical History:  Diagnosis Date  . Acute myocardial infarction [facility]) 2009   CAD/no stent medically managed  . Anxiety disorder   . CAD (coronary artery disease)   . Hyperlipidemia   . Hypertension   . IBS (irritable bowel syndrome)   . Non-ulcer dyspepsia 04/30/2009   Qualifier: Diagnosis of  By: Oneida Alar MD, Sandi L   . Overactive bladder   . PE (pulmonary embolism) JUN 2014  . Presence of permanent cardiac pacemaker   . Sleep apnea     Past Surgical History:  Procedure Laterality Date  . ABDOMINAL HYSTERECTOMY    . BSO secondary to cyst    . CARDIAC CATHETERIZATION    . COLONOSCOPY  12/2009   Dr. West Carbo, propofol, normal. Next TCS 12/2019  . COLONOSCOPY N/A 05/22/2017   Procedure: COLONOSCOPY;  Surgeon: Danie Binder, MD;  [facility];  Service: Endoscopy;  Laterality: N/A;  10:30am  . ESOPHAGOGASTRODUODENOSCOPY  05/11/09   schatzki ring/small hiatal hernia/path:gastritis  . ESOPHAGOGASTRODUODENOSCOPY N/A 05/22/2017   Procedure: ESOPHAGOGASTRODUODENOSCOPY (EGD);  Surgeon: Danie Binder, MD;  Location: [facility];  Service: Endoscopy;  Laterality: N/A;  . ESOPHAGOGASTRODUODENOSCOPY (EGD) WITH ESOPHAGEAL DILATION N/A 04/01/2013   [id number] at the gastroesophageal juction/multiple small  polyps/mild gastritis  . GIVENS CAPSULE STUDY N/A 06/07/2017   Procedure: GIVENS CAPSULE STUDY;  Surgeon: Danie Binder, MD;  [facility]: [facility];  Service: Endoscopy;  Laterality: N/A;  7:30am  . INSERT / REPLACE / REMOVE PACEMAKER     Last year per pt.(can't remember date)    Family History  Problem Relation Age of Onset  . Colon polyps Neg Hx   . Colon cancer Neg Hx     Social History   Socioeconomic History  . Marital status: Married    Spouse name: [name]  . Number of children: 3  . Years of education: 55  . Highest education level: Not on file  Occupational History  . Not on file  Tobacco Use  . Smoking status: Never Smoker  . Smokeless tobacco: Never Used  . Tobacco comment: Never smoked   Vaping Use  . Vaping Use: Never used  Substance and Sexual Activity  . Alcohol use: Yes    Alcohol/week: 0.0 standard drinks    Comment: occ wine  . Drug use: No  . Sexual activity: Not Currently  Other Topics Concern  . Not on file  Social History Narrative      Lives with husband-51 years 64 in Aug 2021   Daughter is close by, 2 sons live further away   One IN [place],ONE IN [place], [place].  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   Enjoys: reading, young adult      Diet: eats all food groups    Caffeine: coffee 1, tea daily soda-1 daily   Water: 1-2 cups      Wears seat belt    Does not use phone while driving   Smoke detectors at home    Public house manager -locked up      Left handed   One story home   Drinks caffeine   Social Determinants of Health   Financial Resource Strain: Low Risk   . Difficulty of Paying Living Expenses: Not hard at all  Food Insecurity: No Food Insecurity  . Worried About Charity fundraiser in the Last Year: Never true  . Ran Out of Food in the Last Year: Never true  Transportation Needs: No Transportation Needs  . Lack of Transportation (Medical): No  . Lack of Transportation  (Non-Medical): No  Physical Activity: Inactive  . Days of Exercise per Week: 0 days  . Minutes of Exercise per Session: 0 min  Stress: No Stress Concern Present  . Feeling of Stress : Not at all  Social Connections: Moderately Integrated  . Frequency of Communication with Friends and Family: More than three times a week  . Frequency of Social Gatherings with Friends and Family: More than three times a week  . Attends Religious Services: More than 4 times per year  . Active Member of Clubs or Organizations: No  . Attends Archivist Meetings: Never  . Marital Status: Married  Human resources officer Violence: Not At Risk  . Fear of Current or Ex-Partner: No  . Emotionally Abused: No  . Physically Abused: No  . Sexually Abused: No    Outpatient Medications Prior to Visit  Medication Sig Dispense Refill  . acetaminophen (TYLENOL) 500 MG tablet Take 1,000 mg by mouth 2 (two) times daily as needed for moderate pain or headache.    . albuterol (PROVENTIL HFA;VENTOLIN HFA) 108 (90 BASE) MCG/ACT inhaler Inhale 2 puffs into the lungs every 6 (six) hours as needed for shortness of breath.    Marland Kitchen amLODipine (NORVASC) 10 MG tablet Take 1 tablet (10 mg total) by mouth daily. 90 tablet 1  . aspirin EC 81 MG tablet Take 81 mg by mouth at bedtime.    . busPIRone (BUSPAR) 5 MG tablet Take 10 mg by mouth daily.    . Calcium Carbonate-Vitamin D (CALCIUM 600+D PO) Take 1 tablet by mouth 2 (two) times daily.    . chlorthalidone (HYGROTON) 25 MG tablet Take 25 mg by mouth daily.     . Cyanocobalamin (B-12) 2500 MCG TABS Take 1 capsule by mouth daily.    . diclofenac (CATAFLAM) 50 MG tablet Take 50 mg by mouth as needed.    . fexofenadine (ALLEGRA) 180 MG tablet 1 tablet as needed    . fluticasone (FLONASE) 50 MCG/ACT nasal spray SPRAY 2 SPRAYS INTO EACH NOSTRIL ONCE DAILY 16 g 0  . Fluticasone Furoate (ARNUITY ELLIPTA) 50 MCG/ACT AEPB     . folic acid (FOLVITE) 1 MG tablet TAKE 1 TABLET BY MOUTH ONCE  DAILY. 30 tablet 11  . ipratropium-albuterol (DUONEB) 0.5-2.5 (3) MG/3ML SOLN Supposed to be doing 4 times daily,  She has been doing as needed    . lidocaine (XYLOCAINE) 2 % solution TAKE 2 TEASPOONFULS BEFORE MEALS AND AT BEDTIME AS NEEDED MAY REPEAT EVERY 4 HOURS. (MAX OF 8 DOSES PER DAY) 300  mL 0  . Lidocaine HCl (XOLIDO) 2 % CREA     . losartan (COZAAR) 50 MG tablet Take 100 mg by mouth daily.    . Magnesium Hydroxide (MILK OF MAGNESIA PO) Take by mouth as needed.    . metoprolol succinate (TOPROL-XL) 50 MG 24 hr tablet Take 75 mg by mouth daily.     . montelukast (SINGULAIR) 10 MG tablet Take 10 mg by mouth daily.     . nitroGLYCERIN (NITROSTAT) 0.4 MG SL tablet Place 0.4 mg under the tongue every 5 (five) minutes as needed for chest pain.     . pantoprazole (PROTONIX) 40 MG tablet Take 40 mg by mouth daily.    . potassium chloride (K-DUR,KLOR-CON) 10 MEQ tablet Take 40 mEq by mouth 2 (two) times daily.     . Probiotic Product (PROBIOTIC PO) Take 1 capsule by mouth daily. [facility].    . sertraline (ZOLOFT) 100 MG tablet Take 200 mg by mouth daily.     . Simethicone (GAS-X PO) Take 2 tablets by mouth daily as needed (gas).    . simvastatin (ZOCOR) 40 MG tablet Take 40 mg by mouth at bedtime.    . vitamin B-12 (CYANOCOBALAMIN) 100 MCG tablet     . vitamin C (ASCORBIC ACID) 500 MG tablet Take 500 mg by mouth daily.    Marland Kitchen warfarin (COUMADIN) 2.5 MG tablet Take 1 tab on Saturdays and Sundays    . warfarin (COUMADIN) 5 MG tablet Take 2.5-5 mg by mouth daily. Mon -Fri   5 mg and Sat -Sun  2.5 mg    . aspirin 81 MG EC tablet 1 tablet    . b complex vitamins tablet Take 1 tablet by mouth daily.    . busPIRone (BUSPAR) 5 MG tablet 1 tablet    . Calcium Carbonate-Vitamin D 600-200 MG-UNIT CAPS     . gabapentin (NEURONTIN) 600 MG tablet Take 600 mg by mouth 2 (two) times daily.     Marland Kitchen HYDROcodone-acetaminophen (NORCO/VICODIN) 5-325 MG tablet take 1-2  tablet by oral route  every 4 - 6  hours as needed for pain    . hydrOXYzine (ATARAX/VISTARIL) 50 MG tablet 1 tablet as needed    . metoprolol tartrate (LOPRESSOR) 50 MG tablet 1.5 tablet with food    . olmesartan (BENICAR) 5 MG tablet Take by mouth.    . potassium chloride (KLOR-CON) 10 MEQ tablet Take by mouth.    . Probiotic Product (PROBIOTIC COLON SUPPORT) CAPS See admin instructions.    . triamcinolone cream (KENALOG) 0.1 % Apply 1 application topically as needed.      No facility-administered medications prior to visit.    Allergies  Allergen Reactions  . Biaxin [Clarithromycin] Other (See Comments)    Stomach problems   . Erythromycin   . Lisinopril Swelling    Review of Systems  Constitutional: Negative.   Respiratory: Negative.   Cardiovascular: Negative.   Musculoskeletal: Positive for joint swelling.       Right knee  Skin: Positive for wound.       Buttocks; top of gluteal cleft  Psychiatric/Behavioral: Negative.        Objective:    Physical Exam Constitutional:      Appearance: Normal appearance.  Cardiovascular:     Rate and Rhythm: Normal rate and regular rhythm.     Pulses: Normal pulses.     Heart sounds: Normal heart sounds.  Pulmonary:     Effort: Pulmonary effort is normal.  Breath sounds: Normal breath sounds.  Skin:    Comments: No wound to gluteal cleft/sacral area; pain palpated over coccyx, but no erythema or sign of skin distress  Neurological:     Mental Status: She is alert.     BP 121/69   Pulse 80   Temp 98.3 F (36.8 C)   Resp 20   Ht 5\' 4"  (1.626 m)   Wt 152 lb (68.9 kg)   SpO2 97%   BMI 26.09 kg/m  Wt Readings from Last 3 Encounters:  10/01/20 152 lb (68.9 kg)  08/19/20 159 lb 4 oz (72.2 kg)  07/29/20 167 lb 3.2 oz (75.8 kg)    Health Maintenance Due  Topic Date Due  . COVID-19 Vaccine (4 - Booster for Moderna series) 09/07/2020    There are no preventive care reminders to display for this patient.   Lab Results  Component Value Date    TSH 0.644 05/02/2018   Lab Results  Component Value Date   WBC 5.7 08/12/2020   HGB 10.1 (L) 08/12/2020   HCT 32.3 (L) 08/12/2020   MCV 99.1 08/12/2020   PLT 118 (L) 08/12/2020   Lab Results  Component Value Date   NA 140 08/12/2020   K 3.8 08/12/2020   CO2 27 08/12/2020   GLUCOSE 92 08/12/2020   BUN 18 08/12/2020   CREATININE 0.82 08/12/2020   BILITOT 0.5 08/12/2020   ALKPHOS 70 08/12/2020   AST 26 08/12/2020   ALT 14 08/12/2020   PROT 9.9 (H) 08/12/2020   ALBUMIN 3.5 08/12/2020   CALCIUM 9.5 08/12/2020   ANIONGAP 11 08/12/2020   Lab Results  Component Value Date   CHOL 151 07/15/2020   Lab Results  Component Value Date   HDL 46 07/15/2020   Lab Results  Component Value Date   LDLCALC 82 07/15/2020   Lab Results  Component Value Date   TRIG 131 07/15/2020   No results found for: CHOLHDL No results found for: HGBA1C     Assessment & Plan:   Problem List Items Addressed This Visit      Cardiovascular and Mediastinum   Essential hypertension    -well controlled        Other   Buttock pain    -to top of gluteal cleft -so sign of skin disturbance/wound -discussed offloading strategies           No orders of the defined types were placed in this encounter.    Noreene Larsson, NP

## 2020-10-01 NOTE — Assessment & Plan Note (Signed)
-  to top of gluteal cleft -so sign of skin disturbance/wound -discussed offloading strategies

## 2020-10-01 NOTE — Assessment & Plan Note (Signed)
well controlled  

## 2020-10-02 ENCOUNTER — Emergency Department (HOSPITAL_COMMUNITY)
Admission: EM | Admit: 2020-10-02 | Discharge: 2020-10-02 | Disposition: A | Discharge status: Home / Self Care | Payer: Medicare Other | Attending: Emergency Medicine | Admitting: Emergency Medicine

## 2020-10-02 ENCOUNTER — Encounter (HOSPITAL_COMMUNITY): Payer: Self-pay | Admitting: *Deleted

## 2020-10-02 ENCOUNTER — Other Ambulatory Visit: Payer: Self-pay

## 2020-10-02 DIAGNOSIS — Z79899 Other long term (current) drug therapy: Secondary | ICD-10-CM | POA: Diagnosis not present

## 2020-10-02 DIAGNOSIS — I2510 Atherosclerotic heart disease of native coronary artery without angina pectoris: Secondary | ICD-10-CM | POA: Diagnosis not present

## 2020-10-02 DIAGNOSIS — Z7982 Long term (current) use of aspirin: Secondary | ICD-10-CM | POA: Diagnosis not present

## 2020-10-02 DIAGNOSIS — Z7901 Long term (current) use of anticoagulants: Secondary | ICD-10-CM | POA: Insufficient documentation

## 2020-10-02 DIAGNOSIS — I10 Essential (primary) hypertension: Secondary | ICD-10-CM | POA: Insufficient documentation

## 2020-10-02 DIAGNOSIS — R040 Epistaxis: Secondary | ICD-10-CM | POA: Diagnosis present

## 2020-10-02 DIAGNOSIS — Z950 Presence of cardiac pacemaker: Secondary | ICD-10-CM | POA: Insufficient documentation

## 2020-10-02 NOTE — Discharge Instructions (Signed)
You had a nose bleed which stopped spontaneously. Nose bleeds can recur however, so please read the instructions below.  If the bleeding recurs, please apply direct pressure to the nose for 5 minutes straight, breathing from the mouth and spitting out any blood. After 5 minutes of holding pressure, if the bleeding continues, do the same thing again for 5 more minutes.  If the bleeding continues despite these measures, then come to the ER while holding direct pressure to the nares.  For now - please do not blow your nose, or put fingers in your nose to clear up any clots.

## 2020-10-02 NOTE — ED Triage Notes (Signed)
States she had an episode of nose bleeding before arrival. States she is on blood thinner

## 2020-10-02 NOTE — ED Provider Notes (Signed)
[facility] EMERGENCY DEPARTMENT Provider Note   CSN: [id number] Arrival date & time: 10/02/20  1801     History Chief Complaint  Patient presents with  . Epistaxis    Courtney Rogers is a 70 y.o. female.  HPI   Patient on coumadin presents with epistaxis to the left nare. It started roughly an hour ago and has not ceased with pressure alone. She reports feeling dizzy, but denies any other discomfort.  Past Medical History:  Diagnosis Date  . Acute myocardial infarction [facility]) 2009   CAD/no stent medically managed  . Anxiety disorder   . CAD (coronary artery disease)   . Hyperlipidemia   . Hypertension   . IBS (irritable bowel syndrome)   . Non-ulcer dyspepsia 04/30/2009   Qualifier: Diagnosis of  By: Oneida Alar MD, Sandi L   . Overactive bladder   . PE (pulmonary embolism) JUN 2014  . Presence of permanent cardiac pacemaker   . Sleep apnea     Patient Active Problem List   Diagnosis Date Noted  . Buttock pain 10/01/2020  . Bilateral knee pain 07/15/2020  . Immunization due 07/15/2020  . Encounter for preventative adult health care examination 07/15/2020  . Easy bruising 04/30/2020  . Trapezius muscle spasm 02/27/2020  . DDD (degenerative disc disease), cervical 01/02/2020  . Hoarseness of voice 11/27/2019  . 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  . Essential hypertension 12/14/2017  . Iron deficiency anemia 08/03/2017  . Dyspepsia   . Back pain 04/19/2017  . Normocytic anemia 01/12/2017  . Fatty liver 11/03/2015  . Anxiety state 11/03/2015  . Weight loss 04/05/2011  . GERD 06/01/2010  . IRRITABLE BOWEL SYNDROME 07/21/2009    Past Surgical History:  Procedure Laterality Date  . ABDOMINAL HYSTERECTOMY    . BSO secondary to cyst    . CARDIAC CATHETERIZATION    . COLONOSCOPY  12/2009   Dr. West Carbo, propofol, normal. Next TCS 12/2019  . COLONOSCOPY N/A 05/22/2017   Procedure: COLONOSCOPY;  Surgeon: Danie Binder, MD;  [facility];  Service:  Endoscopy;  Laterality: N/A;  10:30am  . ESOPHAGOGASTRODUODENOSCOPY  05/11/09   schatzki ring/small hiatal hernia/path:gastritis  . ESOPHAGOGASTRODUODENOSCOPY N/A 05/22/2017   Procedure: ESOPHAGOGASTRODUODENOSCOPY (EGD);  Surgeon: Danie Binder, MD;  [facility]: [facility];  Service: Endoscopy;  Laterality: N/A;  . ESOPHAGOGASTRODUODENOSCOPY (EGD) WITH ESOPHAGEAL DILATION N/A 04/01/2013   [id number] at the gastroesophageal juction/multiple small polyps/mild gastritis  . GIVENS CAPSULE STUDY N/A 06/07/2017   Procedure: GIVENS CAPSULE STUDY;  Surgeon: Danie Binder, MD;  [facility]: [facility];  Service: Endoscopy;  Laterality: N/A;  7:30am  . INSERT / REPLACE / REMOVE PACEMAKER     Last year per pt.(can't remember date)     OB History    Gravida  3   Para  3   Term  3   Preterm      AB      Living        SAB      IAB      Ectopic      Multiple      Live Births              Family History  Problem Relation Age of Onset  . Colon polyps Neg Hx   . Colon cancer Neg Hx     Social History   Tobacco Use  . Smoking status: Never Smoker  . Smokeless tobacco: Never Used  . Tobacco comment: Never smoked  Vaping Use  . Vaping Use: Never used  Substance Use Topics  . Alcohol use: Yes    Alcohol/week: 0.0 standard drinks    Comment: occ wine  . Drug use: No    Home Medications Prior to Admission medications   Medication Sig Start Date End Date Taking? Authorizing Provider  acetaminophen (TYLENOL) 500 MG tablet Take 1,000 mg by mouth 2 (two) times daily as needed for moderate pain or headache.    [provider]  albuterol (PROVENTIL HFA;VENTOLIN HFA) 108 (90 BASE) MCG/ACT inhaler Inhale 2 puffs into the lungs every 6 (six) hours as needed for shortness of breath.    [provider]  amLODipine (NORVASC) 10 MG tablet Take 1 tablet (10 mg total) by mouth daily. 07/15/20   Noreene Larsson, NP  aspirin EC 81 MG tablet Take 81 mg by mouth at  bedtime.    [provider]  busPIRone (BUSPAR) 5 MG tablet Take 10 mg by mouth daily. 11/25/16   [provider]  Calcium Carbonate-Vitamin D (CALCIUM 600+D PO) Take 1 tablet by mouth 2 (two) times daily.    [provider]  chlorthalidone (HYGROTON) 25 MG tablet Take 25 mg by mouth daily.     [provider]  Cyanocobalamin (B-12) 2500 MCG TABS Take 1 capsule by mouth daily.    [provider]  diclofenac (CATAFLAM) 50 MG tablet Take 50 mg by mouth as needed. 01/14/20   [provider]  fexofenadine (ALLEGRA) 180 MG tablet 1 tablet as needed    [provider]  fluticasone (FLONASE) 50 MCG/ACT nasal spray SPRAY 2 SPRAYS INTO EACH NOSTRIL ONCE DAILY 01/19/18   Caren Macadam, MD  Fluticasone Furoate (ARNUITY ELLIPTA) 50 MCG/ACT AEPB     [provider]  folic acid (FOLVITE) 1 MG tablet TAKE 1 TABLET BY MOUTH ONCE DAILY. 01/13/20   Derek Jack, MD  ipratropium-albuterol (DUONEB) 0.5-2.5 (3) MG/3ML SOLN Supposed to be doing 4 times daily,  She has been doing as needed    [provider]  lidocaine (XYLOCAINE) 2 % solution TAKE 2 TEASPOONFULS BEFORE MEALS AND AT BEDTIME AS NEEDED MAY REPEAT EVERY 4 HOURS. (MAX OF 8 DOSES PER DAY) 09/29/20   Annitta Needs, NP  Lidocaine HCl (XOLIDO) 2 % CREA     [provider]  losartan (COZAAR) 50 MG tablet Take 100 mg by mouth daily. 07/18/19   [provider]  Magnesium Hydroxide (MILK OF MAGNESIA PO) Take by mouth as needed.    [provider]  metoprolol succinate (TOPROL-XL) 50 MG 24 hr tablet Take 75 mg by mouth daily.  11/25/16   [provider]  montelukast (SINGULAIR) 10 MG tablet Take 10 mg by mouth daily.     [provider]  nitroGLYCERIN (NITROSTAT) 0.4 MG SL tablet Place 0.4 mg under the tongue every 5 (five) minutes as needed for chest pain.     [provider]  pantoprazole (PROTONIX) 40 MG tablet Take 40 mg by mouth  daily. 08/10/20   [provider]  potassium chloride (K-DUR,KLOR-CON) 10 MEQ tablet Take 40 mEq by mouth 2 (two) times daily.     [provider]  Probiotic Product (PROBIOTIC PO) Take 1 capsule by mouth daily. [facility].    [provider]  sertraline (ZOLOFT) 100 MG tablet Take 200 mg by mouth daily.     [provider]  Simethicone (GAS-X PO) Take 2 tablets by mouth daily as needed (gas).  [provider]  simvastatin (ZOCOR) 40 MG tablet Take 40 mg by mouth at bedtime.    [provider]  vitamin B-12 (CYANOCOBALAMIN) 100 MCG tablet     [provider]  vitamin C (ASCORBIC ACID) 500 MG tablet Take 500 mg by mouth daily.    [provider]  warfarin (COUMADIN) 2.5 MG tablet Take 1 tab on Saturdays and Sundays    [provider]  warfarin (COUMADIN) 5 MG tablet Take 2.5-5 mg by mouth daily. Mon -Fri   5 mg and Sat -Sun  2.5 mg    [provider]    Allergies    Biaxin [clarithromycin], Erythromycin, and Lisinopril  Review of Systems   Review of Systems  Constitutional: Negative for chills and fever.  HENT: Positive for nosebleeds.   Eyes: Negative for pain and visual disturbance.  Respiratory: Negative for cough and shortness of breath.   Cardiovascular: Negative for chest pain and palpitations.  Genitourinary: Negative for dysuria and hematuria.  Musculoskeletal: Negative for back pain.  Skin: Negative for color change and rash.  Neurological: Positive for dizziness. Negative for seizures and syncope.  Hematological: Bruises/bleeds easily.  All other systems reviewed and are negative.   Physical Exam Updated Vital Signs BP 129/73 (BP Location: Right Arm)   Pulse 86   Temp 98.3 F (36.8 C) (Oral)   Resp 18   SpO2 98%   Physical Exam Vitals and nursing note reviewed. Exam conducted with a chaperone present.  Constitutional:      Appearance: Normal appearance.  HENT:      Head: Normocephalic and atraumatic.     Nose:     Comments: Anterior bleeding to the left nare. Bright red blood, no clots.   Eyes:     General: No scleral icterus.       Right eye: No discharge.        Left eye: No discharge.     Extraocular Movements: Extraocular movements intact.     Pupils: Pupils are equal, round, and reactive to light.  Cardiovascular:     Rate and Rhythm: Normal rate and regular rhythm.     Pulses: Normal pulses.     Heart sounds: Normal heart sounds. No murmur heard. No friction rub. No gallop.   Pulmonary:     Effort: Pulmonary effort is normal. No respiratory distress.     Breath sounds: Normal breath sounds.  Abdominal:     General: Abdomen is flat. Bowel sounds are normal. There is no distension.     Palpations: Abdomen is soft.     Tenderness: There is no abdominal tenderness.  Skin:    General: Skin is warm and dry.     Coloration: Skin is not jaundiced.  Neurological:     Mental Status: She is alert. Mental status is at baseline.     Coordination: Coordination normal.     ED Results / Procedures / Treatments   Labs (all labs ordered are listed, but only abnormal results are displayed) Labs Reviewed - No data to display  EKG None  Radiology No results found.  Procedures Procedures   Medications Ordered in ED Medications - No data to display  ED Course  I have reviewed the triage vital signs and the nursing notes.  Pertinent labs & imaging results that were available during my care of the patient were reviewed by me and considered in my medical decision making (see chart for details).    MDM Rules/Calculators/A&P  Vitals stable. PE reassuring. Patient is on coumadin, last dose yesterday. She has anterior bleeding to the left nare. Observed for 2 hours. Bleeding is now controlled. Strict return precautions. Patient verbalized understanding and agreement with the plan.   Discussed HPI, physical exam and  plan of care for this patient with attending Ankit Nanavati. The attending physician evaluated this patient as part of a shared visit and agrees with plan of care.  Final Clinical Impression(s) / ED Diagnoses Final diagnoses:  None    Rx / DC Orders ED Discharge Orders    None       Sherrill Raring, PA-C 10/02/20 1959    Varney Biles, MD 10/06/20 1409

## 2020-10-14 ENCOUNTER — Ambulatory Visit (HOSPITAL_COMMUNITY)
Admission: RE | Admit: 2020-10-14 | Discharge: 2020-10-14 | Disposition: A | Discharge status: Home / Self Care | Payer: Medicare Other | Source: Ambulatory Visit | Attending: Hematology | Admitting: Hematology

## 2020-10-14 ENCOUNTER — Inpatient Hospital Stay (HOSPITAL_COMMUNITY): Payer: Medicare Other | Attending: Hematology

## 2020-10-14 ENCOUNTER — Other Ambulatory Visit: Payer: Self-pay

## 2020-10-14 DIAGNOSIS — Z86711 Personal history of pulmonary embolism: Secondary | ICD-10-CM | POA: Insufficient documentation

## 2020-10-14 DIAGNOSIS — C9000 Multiple myeloma not having achieved remission: Secondary | ICD-10-CM | POA: Diagnosis present

## 2020-10-14 DIAGNOSIS — D649 Anemia, unspecified: Secondary | ICD-10-CM | POA: Diagnosis not present

## 2020-10-14 DIAGNOSIS — Z7901 Long term (current) use of anticoagulants: Secondary | ICD-10-CM | POA: Diagnosis not present

## 2020-10-14 DIAGNOSIS — D472 Monoclonal gammopathy: Secondary | ICD-10-CM

## 2020-10-14 LAB — CBC WITH DIFFERENTIAL/PLATELET
Abs Immature Granulocytes: 0.07 10*3/uL (ref 0.00–0.07)
Basophils Absolute: 0 10*3/uL (ref 0.0–0.1)
Basophils Relative: 0 %
Eosinophils Absolute: 0.1 10*3/uL (ref 0.0–0.5)
Eosinophils Relative: 1 %
HCT: 28.5 % — ABNORMAL LOW (ref 36.0–46.0)
Hemoglobin: 8.8 g/dL — ABNORMAL LOW (ref 12.0–15.0)
Immature Granulocytes: 1 %
Lymphocytes Relative: 16 %
Lymphs Abs: 1.6 10*3/uL (ref 0.7–4.0)
MCH: 31.1 pg (ref 26.0–34.0)
MCHC: 30.9 g/dL (ref 30.0–36.0)
MCV: 100.7 fL — ABNORMAL HIGH (ref 80.0–100.0)
Monocytes Absolute: 0.9 10*3/uL (ref 0.1–1.0)
Monocytes Relative: 9 %
Neutro Abs: 7.1 10*3/uL (ref 1.7–7.7)
Neutrophils Relative %: 73 %
Platelets: 161 10*3/uL (ref 150–400)
RBC: 2.83 MIL/uL — ABNORMAL LOW (ref 3.87–5.11)
RDW: 20 % — ABNORMAL HIGH (ref 11.5–15.5)
WBC: 9.8 10*3/uL (ref 4.0–10.5)
nRBC: 0 % (ref 0.0–0.2)

## 2020-10-14 LAB — COMPREHENSIVE METABOLIC PANEL
ALT: 13 U/L (ref 0–44)
AST: 22 U/L (ref 15–41)
Albumin: 3.5 g/dL (ref 3.5–5.0)
Alkaline Phosphatase: 63 U/L (ref 38–126)
Anion gap: 7 (ref 5–15)
BUN: 23 mg/dL (ref 8–23)
CO2: 27 mmol/L (ref 22–32)
Calcium: 9 mg/dL (ref 8.9–10.3)
Chloride: 104 mmol/L (ref 98–111)
Creatinine, Ser: 0.91 mg/dL (ref 0.44–1.00)
GFR, Estimated: >60 mL/min (ref >60)
Glucose, Bld: 91 mg/dL (ref 70–99)
Potassium: 3.4 mmol/L — ABNORMAL LOW (ref 3.5–5.1)
Sodium: 138 mmol/L (ref 135–145)
Total Bilirubin: 0.5 mg/dL (ref 0.3–1.2)
Total Protein: 10 g/dL — ABNORMAL HIGH (ref 6.5–8.1)

## 2020-10-15 LAB — KAPPA/LAMBDA LIGHT CHAINS
Kappa free light chain: 30.8 mg/L — ABNORMAL HIGH (ref 3.3–19.4)
Kappa, lambda light chain ratio: 11.41 — ABNORMAL HIGH (ref 0.26–1.65)
Lambda free light chains: 2.7 mg/L — ABNORMAL LOW (ref 5.7–26.3)

## 2020-10-20 LAB — PROTEIN ELECTROPHORESIS, SERUM
A/G Ratio: 0.6 — ABNORMAL LOW (ref 0.7–1.7)
Albumin ELP: 3.8 g/dL (ref 2.9–4.4)
Alpha-1-Globulin: 0.3 g/dL (ref 0.0–0.4)
Alpha-2-Globulin: 0.6 g/dL (ref 0.4–1.0)
Beta Globulin: 1 g/dL (ref 0.7–1.3)
Gamma Globulin: 4 g/dL — ABNORMAL HIGH (ref 0.4–1.8)
Globulin, Total: 5.9 g/dL — ABNORMAL HIGH (ref 2.2–3.9)
M-Spike, %: 2.8 g/dL — ABNORMAL HIGH
Total Protein ELP: 9.7 g/dL — ABNORMAL HIGH (ref 6.0–8.5)

## 2020-10-20 NOTE — Progress Notes (Signed)
[address], [place] [address]   CLINIC:  Medical Oncology/Hematology  PCP:  Perlie Mayo, NP [place] / [place] [address]  [phone]  REASON FOR VISIT:  Follow-up for smoldering myeloma  PRIOR THERAPY: none  CURRENT THERAPY: surveillance  INTERVAL HISTORY:  Courtney Rogers, a 70 y.o. female, returns for routine follow-up for her smoldering myeloma. Courtney Rogers was last seen on 08/19/2020.  Today she reports feeling well. She reports no new pains. She had surgery on 08/25/2020. She denies any bleeding and bloody or black stools. She is not taking any iron pills; they upset her stomach.   REVIEW OF SYSTEMS:  Review of Systems  Constitutional: Positive for fatigue (30%). Negative for appetite change.  HENT:   Negative for nosebleeds.   Gastrointestinal: Negative for blood in stool.  Musculoskeletal: Positive for back pain (10/10 mid-lower).  Neurological: Positive for numbness (R leg).  Hematological: Does not bruise/bleed easily.  All other systems reviewed and are negative.   PAST MEDICAL/SURGICAL HISTORY:  Past Medical History:  Diagnosis Date  . Acute myocardial infarction [facility]) 2009   CAD/no stent medically managed  . Anxiety disorder   . CAD (coronary artery disease)   . Hyperlipidemia   . Hypertension   . IBS (irritable bowel syndrome)   . Non-ulcer dyspepsia 04/30/2009   Qualifier: Diagnosis of  By: Oneida Alar MD, Sandi L   . Overactive bladder   . PE (pulmonary embolism) JUN 2014  . Presence of permanent cardiac pacemaker   . Sleep apnea    Past Surgical History:  Procedure Laterality Date  . ABDOMINAL HYSTERECTOMY    . BSO secondary to cyst    . CARDIAC CATHETERIZATION    . COLONOSCOPY  12/2009   Dr. West Carbo, propofol, normal. Next TCS 12/2019  . COLONOSCOPY N/A 05/22/2017   Procedure: COLONOSCOPY;  Surgeon: Danie Binder, MD;  [facility];  Service: Endoscopy;  Laterality: N/A;  10:30am   . ESOPHAGOGASTRODUODENOSCOPY  05/11/09   schatzki ring/small hiatal hernia/path:gastritis  . ESOPHAGOGASTRODUODENOSCOPY N/A 05/22/2017   Procedure: ESOPHAGOGASTRODUODENOSCOPY (EGD);  Surgeon: Danie Binder, MD;  Location: [facility];  Service: Endoscopy;  Laterality: N/A;  . ESOPHAGOGASTRODUODENOSCOPY (EGD) WITH ESOPHAGEAL DILATION N/A 04/01/2013   HRC:BULAGTXMI at the gastroesophageal juction/multiple small polyps/mild gastritis  . GIVENS CAPSULE STUDY N/A 06/07/2017   Procedure: GIVENS CAPSULE STUDY;  Surgeon: Danie Binder, MD;  Location: [facility];  Service: Endoscopy;  Laterality: N/A;  7:30am  . INSERT / REPLACE / REMOVE PACEMAKER     Last year per pt.(can't remember date)    SOCIAL HISTORY:  Social History   Socioeconomic History  . Marital status: Married    Spouse name: [name]  . Number of children: 3  . Years of education: 47  . Highest education level: Not on file  Occupational History  . Not on file  Tobacco Use  . Smoking status: Never Smoker  . Smokeless tobacco: Never Used  . Tobacco comment: Never smoked   Vaping Use  . Vaping Use: Never used  Substance and Sexual Activity  . Alcohol use: Yes    Alcohol/week: 0.0 standard drinks    Comment: occ wine  . Drug use: No  . Sexual activity: Not Currently  Other Topics Concern  . Not on file  Social History Narrative      Lives with husband-51 years 59 in Aug 2021   Daughter is close by, 2 sons  live further away   One IN [place],ONE IN [place], [place].        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   Enjoys: reading, young adult      Diet: eats all food groups    Caffeine: coffee 1, tea daily soda-1 daily   Water: 1-2 cups      Wears seat belt    Does not use phone while driving   Smoke detectors at home    Public house manager -locked up      Left handed   One story home   Drinks caffeine   Social Determinants of Health   Financial Resource Strain: Low Risk    . Difficulty of Paying Living Expenses: Not hard at all  Food Insecurity: No Food Insecurity  . Worried About Charity fundraiser in the Last Year: Never true  . Ran Out of Food in the Last Year: Never true  Transportation Needs: No Transportation Needs  . Lack of Transportation (Medical): No  . Lack of Transportation (Non-Medical): No  Physical Activity: Inactive  . Days of Exercise per Week: 0 days  . Minutes of Exercise per Session: 0 min  Stress: No Stress Concern Present  . Feeling of Stress : Not at all  Social Connections: Moderately Integrated  . Frequency of Communication with Friends and Family: More than three times a week  . Frequency of Social Gatherings with Friends and Family: More than three times a week  . Attends Religious Services: More than 4 times per year  . Active Member of Clubs or Organizations: No  . Attends Archivist Meetings: Never  . Marital Status: Married  Human resources officer Violence: Not At Risk  . Fear of Current or Ex-Partner: No  . Emotionally Abused: No  . Physically Abused: No  . Sexually Abused: No    FAMILY HISTORY:  Family History  Problem Relation Age of Onset  . Colon polyps Neg Hx   . Colon cancer Neg Hx     CURRENT MEDICATIONS:  Current Outpatient Medications  Medication Sig Dispense Refill  . acetaminophen (TYLENOL) 500 MG tablet Take 1,000 mg by mouth 2 (two) times daily as needed for moderate pain or headache.    . albuterol (PROVENTIL HFA;VENTOLIN HFA) 108 (90 BASE) MCG/ACT inhaler Inhale 2 puffs into the lungs every 6 (six) hours as needed for shortness of breath.    Marland Kitchen amLODipine (NORVASC) 10 MG tablet Take 1 tablet (10 mg total) by mouth daily. 90 tablet 1  . aspirin EC 81 MG tablet Take 81 mg by mouth at bedtime.    . busPIRone (BUSPAR) 5 MG tablet Take 10 mg by mouth daily.    . Calcium Carbonate-Vitamin D (CALCIUM 600+D PO) Take 1 tablet by mouth 2 (two) times daily.    . chlorthalidone (HYGROTON) 25 MG tablet  Take 25 mg by mouth daily.     . Cyanocobalamin (B-12) 2500 MCG TABS Take 1 capsule by mouth daily.    . diclofenac (CATAFLAM) 50 MG tablet Take 50 mg by mouth as needed.    . fexofenadine (ALLEGRA) 180 MG tablet Take 180 mg by mouth daily.    . fluticasone (FLONASE) 50 MCG/ACT nasal spray SPRAY 2 SPRAYS INTO EACH NOSTRIL ONCE DAILY (Patient taking differently: Place 2 sprays into both nostrils daily.) 16 g 0  . Fluticasone Furoate (ARNUITY ELLIPTA) 50 MCG/ACT AEPB  (Patient not taking: No sig reported)    . folic acid (  FOLVITE) 1 MG tablet TAKE 1 TABLET BY MOUTH ONCE DAILY. 30 tablet 11  . ipratropium-albuterol (DUONEB) 0.5-2.5 (3) MG/3ML SOLN Inhale 3 mLs into the lungs every 4 (four) hours as needed. Supposed to be doing 4 times daily,  She has been doing as needed    . lidocaine (XYLOCAINE) 2 % solution TAKE 2 TEASPOONFULS BEFORE MEALS AND AT BEDTIME AS NEEDED MAY REPEAT EVERY 4 HOURS. (MAX OF 8 DOSES PER DAY) 300 mL 0  . Lidocaine HCl (XOLIDO) 2 % CREA Apply 1 application topically daily as needed (arthritisis).    Marland Kitchen losartan (COZAAR) 50 MG tablet Take 100 mg by mouth daily.    . Magnesium Hydroxide (MILK OF MAGNESIA PO) Take by mouth as needed.    Marland Kitchen METAMUCIL MULTIHEALTH FIBER 58.12 % PACK Take by mouth.    . metoprolol succinate (TOPROL-XL) 50 MG 24 hr tablet Take 50 mg by mouth See admin instructions. Take 1 and 1/2 tablet daily    . mirtazapine (REMERON) 15 MG tablet Take 15 mg by mouth at bedtime.    . montelukast (SINGULAIR) 10 MG tablet Take 10 mg by mouth daily.     . nitroGLYCERIN (NITROSTAT) 0.4 MG SL tablet Place 0.4 mg under the tongue every 5 (five) minutes as needed for chest pain.     . pantoprazole (PROTONIX) 40 MG tablet Take 40 mg by mouth daily.    . potassium chloride (K-DUR,KLOR-CON) 10 MEQ tablet Take 10 mEq by mouth daily.    . Probiotic Product (PROBIOTIC PO) Take 1 capsule by mouth daily. [facility].    . sertraline (ZOLOFT) 100 MG tablet Take 200 mg by  mouth daily.     . Simethicone (GAS-X PO) Take 2 tablets by mouth daily as needed (gas).    . simvastatin (ZOCOR) 40 MG tablet Take 40 mg by mouth at bedtime.    . vitamin B-12 (CYANOCOBALAMIN) 100 MCG tablet  (Patient not taking: No sig reported)    . vitamin C (ASCORBIC ACID) 500 MG tablet Take 500 mg by mouth daily.    Marland Kitchen warfarin (COUMADIN) 2.5 MG tablet Take 2.5 mg by mouth See admin instructions. Take 81m Mon-Fridays and  on Saturdays and Sundays  take 2.5 mg    . warfarin (COUMADIN) 5 MG tablet Take 2.5-5 mg by mouth daily. Mon -Fri   5 mg and Sat -Sun  2.5 mg     No current facility-administered medications for this visit.    ALLERGIES:  Allergies  Allergen Reactions  . Biaxin [Clarithromycin] Other (See Comments)    Stomach problems   . Erythromycin   . Lisinopril Swelling    PHYSICAL EXAM:  Performance status (ECOG): 1 - Symptomatic but completely ambulatory  There were no vitals filed for this visit. Wt Readings from Last 3 Encounters:  10/01/20 152 lb (68.9 kg)  08/19/20 159 lb 4 oz (72.2 kg)  07/29/20 167 lb 3.2 oz (75.8 kg)   Physical Exam Vitals reviewed.  Constitutional:      Appearance: Normal appearance.  Cardiovascular:     Rate and Rhythm: Normal rate and regular rhythm.     Pulses: Normal pulses.     Heart sounds: Normal heart sounds.  Pulmonary:     Effort: Pulmonary effort is normal.     Breath sounds: Normal breath sounds.  Musculoskeletal:     Right lower leg: No edema.     Left lower leg: No edema.  Neurological:     General: No focal  deficit present.     Mental Status: She is alert and oriented to person, place, and time.  Psychiatric:        Mood and Affect: Mood normal.        Behavior: Behavior normal.     LABORATORY DATA:  I have reviewed the labs as listed.  CBC Latest Ref Rng & Units 10/14/2020 08/12/2020 07/15/2020  WBC 4.0 - 10.5 K/uL 9.8 5.7 5.9  Hemoglobin 12.0 - 15.0 g/dL 8.8(L) 10.1(L) 9.8(L)  Hematocrit 36.0 - 46.0 % 28.5(L)  32.3(L) 30.7(L)  Platelets 150 - 400 K/uL 161 118(L) 117(L)   CMP Latest Ref Rng & Units 10/14/2020 08/12/2020 07/15/2020  Glucose 70 - 99 mg/dL 91 92 86  BUN 8 - 23 mg/dL _0 Creatinine 0.44 - 1.00 mg/dL 0.91 0.82 0.85  Sodium 135 - 145 mmol/L 138 140 141  Potassium 3.5 - 5.1 mmol/L 3.4(L) 3.8 3.9  Chloride 98 - 111 mmol/L 104 102 100  CO2 22 - 32 mmol/L _1 Calcium 8.9 - 10.3 mg/dL 9.0 9.5 9.5  Total Protein 6.5 - 8.1 g/dL 10.0(H) 9.9(H) 10.0(H)  Total Bilirubin 0.3 - 1.2 mg/dL 0.5 0.5 0.4  Alkaline Phos 38 - 126 U/L 63 70 89  AST 15 - 41 U/L _2 ALT 0 - 44 U/L _3 Component Value Date/Time   RBC 2.83 (L) 10/14/2020 1250   MCV 100.7 (H) 10/14/2020 1250   MCV 95 07/15/2020 1113   MCH 31.1 10/14/2020 1250   MCHC 30.9 10/14/2020 1250   RDW 20.0 (H) 10/14/2020 1250   RDW 14.5 07/15/2020 1113   LYMPHSABS 1.6 10/14/2020 1250   LYMPHSABS 1.8 07/15/2020 1113   MONOABS 0.9 10/14/2020 1250   EOSABS 0.1 10/14/2020 1250   EOSABS 0.2 07/15/2020 1113   BASOSABS 0.0 10/14/2020 1250   BASOSABS 0.0 07/15/2020 1113    DIAGNOSTIC IMAGING:  I have independently reviewed the scans and discussed with the patient. DG Bone Survey Met  Result Date: 10/15/2020 CLINICAL DATA:  smoldering myeloma EXAM: METASTATIC BONE SURVEY COMPARISON:  PET-CT 04/07/2020 FINDINGS: There are no suspicious osseous lesions. There is no evidence of acute fracture. Lateral view of the skull is unremarkable. There is bilateral shoulder degenerative change with subacromial spurring. There is mild degenerative arthritis of the elbows bilaterally. There is multilevel degenerative disc disease in the cervical spine, worst at C5-C6 and C6-C7 with mild facet arthritis. Multilevel degenerative changes of the thoracic spine. Prior L1 vertebroplasty with chronic L1 compression fracture. No evidence of progressive height loss in comparison to prior CT in November 2021. Multilevel degenerative disc disease in  the lumbar spine, with moderate disc height loss at L4-L5 and L5-S1. Moderate-severe multilevel facet arthritis. There is grade 1 anterolisthesis at L3-L4, unchanged. Dual chamber pacemaker leads overlying the right atrium and right ventricle. Unchanged, mildly enlarged cardiac silhouette. No new focal airspace disease. In the pelvis, there is bilateral SI joint, bilateral hip, and pubic symphysis degenerative arthritis. There is ossification of the iliolumbar ligaments. Likely subchondral cystic change in the acetabuli. There is a right total knee arthroplasty without evidence of complication. 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 IMPRESSION: No suspicious osseous lesions.  Chronic findings as described above. Electronically Signed   By: Maurine Simmering   On: 10/15/2020 15:02     ASSESSMENT:  1. IgA kappa smoldering myeloma: -BMBX on 07/09/2019 shows hypercellular marrow for age with trilineage hematopoiesis. Increased  number of atypical plasma cells present 36% of all cell lines. Plasma cells are kappa light chain restricted. -Unfortunately her specimen has reached cytogenetics and FISH lab week later due to bad weather and no viable plasma cells. -PET scan on 07/09/2019 showed no findings of active myeloma. -24-hour urine shows total protein 59 mg. Urine immunofixation shows IgA kappa monoclonal protein. LDH is 184. -Based on [facility] criteria she has high risk with about 45-50% probability of progression to myeloma in the next 2 years. -CTAP on 01/07/2020 shows acute superior endplate burst fracture of L1. Mild associated paravertebral soft tissue thickening. No other bone abnormalities. -Bone density test on 02/07/2020 shows T score -0.5.   PLAN:  1. IgA kappa smoldering myeloma: -Last PET scan in November 2021 with no evidence of multiple myeloma. - She has a fracture of L1 and wears a brace. - She had [facility] by Dr. Chauncey Reading. - Myeloma  labs from 10/14/2020 shows M spike 2.8 g, down from 3.1 g previously.  However free light chain ratio has increased to 11.4 from 9.07.  Kappa light chains increased to 30.8 from 26. - Calcium and creatinine were normal. - I plan to repeat myeloma panel again in 3 months.  2.Normocytic anemia: -We reviewed her labs from today which showed hemoglobin 8.7 and MCV 101.5. - We will check ferritin and iron panel along with G98, folic acid, methylmalonic acid. - If the iron is low, will schedule her for Feraheme x2.  3. Pulmonary embolism: -She had PE in 2015 and is currently taking Coumadin.  Orders placed this encounter:  No orders of the defined types were placed in this encounter.  Orders placed this encounter:  No orders of the defined types were placed in this encounter.    Derek Jack, MD [place] [phone]   I, Thana Ates, am acting as a scribe for Dr. Derek Jack.  I, Derek Jack MD, have reviewed the above documentation for accuracy and completeness, and I agree with the above.

## 2020-10-21 ENCOUNTER — Inpatient Hospital Stay (HOSPITAL_COMMUNITY): Payer: Medicare Other | Attending: Hematology | Admitting: Hematology

## 2020-10-21 ENCOUNTER — Other Ambulatory Visit: Payer: Self-pay

## 2020-10-21 ENCOUNTER — Inpatient Hospital Stay (HOSPITAL_COMMUNITY): Payer: Medicare Other

## 2020-10-21 VITALS — BP 101/52 | HR 71 | Temp 97.1°F | Resp 17 | Wt 159.1 lb

## 2020-10-21 DIAGNOSIS — M1712 Unilateral primary osteoarthritis, left knee: Secondary | ICD-10-CM | POA: Insufficient documentation

## 2020-10-21 DIAGNOSIS — R5383 Other fatigue: Secondary | ICD-10-CM | POA: Diagnosis not present

## 2020-10-21 DIAGNOSIS — I10 Essential (primary) hypertension: Secondary | ICD-10-CM | POA: Insufficient documentation

## 2020-10-21 DIAGNOSIS — D472 Monoclonal gammopathy: Secondary | ICD-10-CM | POA: Insufficient documentation

## 2020-10-21 DIAGNOSIS — D509 Iron deficiency anemia, unspecified: Secondary | ICD-10-CM | POA: Insufficient documentation

## 2020-10-21 DIAGNOSIS — R200 Anesthesia of skin: Secondary | ICD-10-CM | POA: Diagnosis not present

## 2020-10-21 DIAGNOSIS — M549 Dorsalgia, unspecified: Secondary | ICD-10-CM | POA: Diagnosis not present

## 2020-10-21 DIAGNOSIS — C9000 Multiple myeloma not having achieved remission: Secondary | ICD-10-CM

## 2020-10-21 DIAGNOSIS — Z79899 Other long term (current) drug therapy: Secondary | ICD-10-CM | POA: Diagnosis not present

## 2020-10-21 LAB — CBC WITH DIFFERENTIAL/PLATELET
Abs Immature Granulocytes: 0.1 10*3/uL — ABNORMAL HIGH (ref 0.00–0.07)
Basophils Absolute: 0 10*3/uL (ref 0.0–0.1)
Basophils Relative: 0 %
Eosinophils Absolute: 0.2 10*3/uL (ref 0.0–0.5)
Eosinophils Relative: 2 %
HCT: 27.7 % — ABNORMAL LOW (ref 36.0–46.0)
Hemoglobin: 8.7 g/dL — ABNORMAL LOW (ref 12.0–15.0)
Immature Granulocytes: 1 %
Lymphocytes Relative: 21 %
Lymphs Abs: 1.8 10*3/uL (ref 0.7–4.0)
MCH: 31.9 pg (ref 26.0–34.0)
MCHC: 31.4 g/dL (ref 30.0–36.0)
MCV: 101.5 fL — ABNORMAL HIGH (ref 80.0–100.0)
Monocytes Absolute: 0.8 10*3/uL (ref 0.1–1.0)
Monocytes Relative: 9 %
Neutro Abs: 5.8 10*3/uL (ref 1.7–7.7)
Neutrophils Relative %: 67 %
Platelets: 180 10*3/uL (ref 150–400)
RBC: 2.73 MIL/uL — ABNORMAL LOW (ref 3.87–5.11)
RDW: 20 % — ABNORMAL HIGH (ref 11.5–15.5)
WBC: 8.8 10*3/uL (ref 4.0–10.5)
nRBC: 0 % (ref 0.0–0.2)

## 2020-10-21 LAB — VITAMIN B12: Vitamin B-12: 680 pg/mL (ref 180–914)

## 2020-10-21 LAB — IRON AND TIBC
Iron: 84 ug/dL (ref 28–170)
Saturation Ratios: 28 % (ref 10.4–31.8)
TIBC: 302 ug/dL (ref 250–450)
UIBC: 218 ug/dL

## 2020-10-21 LAB — FERRITIN: Ferritin: 274 ng/mL (ref 11–307)

## 2020-10-21 LAB — FOLATE: Folate: >100 ng/mL (ref >5.9)

## 2020-10-21 LAB — RETICULOCYTES
Immature Retic Fract: 17.6 % — ABNORMAL HIGH (ref 2.3–15.9)
RBC.: 2.79 MIL/uL — ABNORMAL LOW (ref 3.87–5.11)
Retic Count, Absolute: 82.9 10*3/uL (ref 19.0–186.0)
Retic Ct Pct: 3 % (ref 0.4–3.1)

## 2020-10-21 NOTE — Patient Instructions (Signed)
[facility] Discharge Instructions  You were seen today by Dr. Katragadda. He went over your recent results. Dr. Katragadda will see you back in 3 months for labs and follow up.   Thank you for choosing [facility] at [facility] to provide your oncology and hematology care.  To afford each patient quality time with our provider, please arrive at least 15 minutes before your scheduled appointment time.   If you have a lab appointment with the Cancer Center please come in thru the Main Entrance and check in at the main information desk  You need to re-schedule your appointment should you arrive 10 or more minutes late.  We strive to give you quality time with our providers, and arriving late affects you and other patients whose appointments are after yours.  Also, if you no show three or more times for appointments you may be dismissed from the clinic at the providers discretion.     Again, thank you for choosing [facility].  Our hope is that these requests will decrease the amount of time that you wait before being seen by our physicians.       _____________________________________________________________  Should you have questions after your visit to [facility], please contact our office at [phone] between the hours of 8:00 a.m. and 4:30 p.m.  Voicemails left after 4:00 p.m. will not be returned until the following business day.  For prescription refill requests, have your pharmacy contact our office and allow 72 hours.    Cancer Center Support Programs:   > Cancer Support Group  2nd Tuesday of the month 1pm-2pm, Journey Room   

## 2020-10-24 LAB — METHYLMALONIC ACID, SERUM: Methylmalonic Acid, Quantitative: 137 nmol/L (ref 0–378)

## 2020-10-28 ENCOUNTER — Ambulatory Visit: Payer: Medicare Other | Admitting: Nurse Practitioner

## 2020-10-29 ENCOUNTER — Ambulatory Visit (INDEPENDENT_AMBULATORY_CARE_PROVIDER_SITE_OTHER): Payer: Medicare Other | Admitting: Nurse Practitioner

## 2020-10-29 ENCOUNTER — Encounter: Payer: Self-pay | Admitting: Nurse Practitioner

## 2020-10-29 ENCOUNTER — Other Ambulatory Visit: Payer: Self-pay

## 2020-10-29 VITALS — BP 131/72 | HR 86 | Temp 98.4°F | Resp 18 | Ht 63.0 in | Wt 158.0 lb

## 2020-10-29 DIAGNOSIS — M810 Age-related osteoporosis without current pathological fracture: Secondary | ICD-10-CM | POA: Diagnosis not present

## 2020-10-29 DIAGNOSIS — I10 Essential (primary) hypertension: Secondary | ICD-10-CM

## 2020-10-29 DIAGNOSIS — M7918 Myalgia, other site: Secondary | ICD-10-CM | POA: Diagnosis not present

## 2020-10-29 DIAGNOSIS — Z0001 Encounter for general adult medical examination with abnormal findings: Secondary | ICD-10-CM

## 2020-10-29 DIAGNOSIS — Z0000 Encounter for general adult medical examination without abnormal findings: Secondary | ICD-10-CM

## 2020-10-29 NOTE — Patient Instructions (Signed)
You have a labd follow-up in a few weeks. Please have fasting labs drawn 2-3 days prior to your appointment so we can discuss the results during your office visit.

## 2020-10-29 NOTE — Assessment & Plan Note (Signed)
-  she would like to be scheduled for medicare AWV

## 2020-10-29 NOTE — Assessment & Plan Note (Signed)
-  takes tramadol and hydrocodone for pain -we discussed offloading strategies including using pillows under her hip while she is sitting and alternating at least once per hour -no wound or erythema present on exam today- Courtney Rogers chaperoned

## 2020-10-29 NOTE — Progress Notes (Signed)
 Acute Office Visit  Subjective:    Patient ID: Courtney Rogers, female    DOB: 07/07/1950, 69 y.o.   MRN: [id number]  Chief Complaint  Patient presents with   Tailbone Pain    X 2 weeks     HPI Patient is in today for pain to her buttocks x 2 weeks.  She is having some stinging pain in the same spot as about a month ago. She hasn't noticed a wound, but has irritation around her coccyx.  She states that she has lower back pain. She states she has been taking tramadol and another medicine from Dr. Voos, an orthopedic surgeon, in [place] in addition to hydrocodone.  The PMP database shows that she got 7 days of oxy-APAP last 08/24/20, and 14 days of hydrocodone-APAP on 05/29/20.  Past Medical History:  Diagnosis Date   Acute myocardial infarction (HCC) 2009   CAD/no stent medically managed   Anxiety disorder    CAD (coronary artery disease)    Hyperlipidemia    Hypertension    IBS (irritable bowel syndrome)    Non-ulcer dyspepsia 04/30/2009   Qualifier: Diagnosis of  By: Fields MD, Sandi L    Overactive bladder    PE (pulmonary embolism) JUN 2014   Presence of permanent cardiac pacemaker    Sleep apnea     Past Surgical History:  Procedure Laterality Date   ABDOMINAL HYSTERECTOMY     BSO secondary to cyst     CARDIAC CATHETERIZATION     COLONOSCOPY  12/2009   Dr. Spainhour, propofol, normal. Next TCS 12/2019   COLONOSCOPY N/A 05/22/2017   Procedure: COLONOSCOPY;  Surgeon: Fields, Sandi L, MD;  [facility];  Service: Endoscopy;  Laterality: N/A;  10:30am   ESOPHAGOGASTRODUODENOSCOPY  05/11/09   schatzki ring/small hiatal hernia/path:gastritis   ESOPHAGOGASTRODUODENOSCOPY N/A 05/22/2017   Procedure: ESOPHAGOGASTRODUODENOSCOPY (EGD);  Surgeon: Fields, Sandi L, MD;  [facility];  Service: Endoscopy;  Laterality: N/A;   ESOPHAGOGASTRODUODENOSCOPY (EGD) WITH ESOPHAGEAL DILATION N/A 04/01/2013   SLF:stricture at the gastroesophageal juction/multiple small  polyps/mild gastritis   GIVENS CAPSULE STUDY N/A 06/07/2017   Procedure: GIVENS CAPSULE STUDY;  Surgeon: Fields, Sandi L, MD;  [facility];  Service: Endoscopy;  Laterality: N/A;  7:30am   INSERT / REPLACE / REMOVE PACEMAKER     Last year per pt.(can't remember date)    Family History  Problem Relation Age of Onset   Colon polyps Neg Hx    Colon cancer Neg Hx     Social History   Socioeconomic History   Marital status: Married    Spouse name: Ulysses   Number of children: 3   Years of education: 16   Highest education level: Not on file  Occupational History   Not on file  Tobacco Use   Smoking status: Never   Smokeless tobacco: Never   Tobacco comments:    Never smoked   Vaping Use   Vaping Use: Never used  Substance and Sexual Activity   Alcohol use: Yes    Alcohol/week: 0.0 standard drinks    Comment: occ wine   Drug use: No   Sexual activity: Not Currently  Other Topics Concern   Not on file  Social History Narrative      Lives with husband-51 years 950 in Aug 2021   Daughter is close by, 2 sons live further away   One IN [place],ONE IN [place], ONE BESIDE HER.          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   Enjoys: reading, young adult      Diet: eats all food groups    Caffeine: coffee 1, tea daily soda-1 daily   Water: 1-2 cups      Wears seat belt    Does not use phone while driving   Smoke detectors at home    Fire extinguisher   Weapons -locked up      Left handed   One story home   Drinks caffeine   Social Determinants of Health   Financial Resource Strain: Low Risk    Difficulty of Paying Living Expenses: Not hard at all  Food Insecurity: No Food Insecurity   Worried About Running Out of Food in the Last Year: Never true   Ran Out of Food in the Last Year: Never true  Transportation Needs: No Transportation Needs   Lack of Transportation (Medical): No   Lack of Transportation (Non-Medical): No  Physical Activity:  Inactive   Days of Exercise per Week: 0 days   Minutes of Exercise per Session: 0 min  Stress: No Stress Concern Present   Feeling of Stress : Not at all  Social Connections: Moderately Integrated   Frequency of Communication with Friends and Family: More than three times a week   Frequency of Social Gatherings with Friends and Family: More than three times a week   Attends Religious Services: More than 4 times per year   Active Member of Clubs or Organizations: No   Attends Club or Organization Meetings: Never   Marital Status: Married  Intimate Partner Violence: Not At Risk   Fear of Current or Ex-Partner: No   Emotionally Abused: No   Physically Abused: No   Sexually Abused: No    Outpatient Medications Prior to Visit  Medication Sig Dispense Refill   acetaminophen (TYLENOL) 500 MG tablet Take 1,000 mg by mouth 2 (two) times daily as needed for moderate pain or headache.     albuterol (PROVENTIL HFA;VENTOLIN HFA) 108 (90 BASE) MCG/ACT inhaler Inhale 2 puffs into the lungs every 6 (six) hours as needed for shortness of breath.     amLODipine (NORVASC) 10 MG tablet Take 1 tablet (10 mg total) by mouth daily. 90 tablet 1   aspirin EC 81 MG tablet Take 81 mg by mouth at bedtime.     B Complex Vitamins (VITAMIN B COMPLEX) TABS      busPIRone (BUSPAR) 5 MG tablet Take 10 mg by mouth daily.     Calcium Carbonate-Vitamin D (CALCIUM 600+D PO) Take 1 tablet by mouth 2 (two) times daily.     chlorthalidone (HYGROTON) 25 MG tablet Take 25 mg by mouth daily.      cholecalciferol (VITAMIN D) 25 MCG (1000 UNIT) tablet Take 1,000 Units by mouth daily.     clonazePAM (KLONOPIN) 0.5 MG tablet Take 0.5 mg by mouth 2 (two) times daily as needed.     Cyanocobalamin (B-12) 2500 MCG TABS Take 1 capsule by mouth daily.     diclofenac (CATAFLAM) 50 MG tablet Take 50 mg by mouth as needed.     famotidine (PEPCID) 20 MG tablet      fexofenadine (ALLEGRA) 180 MG tablet Take 180 mg by mouth daily.      fluticasone (FLONASE) 50 MCG/ACT nasal spray SPRAY 2 SPRAYS INTO EACH NOSTRIL ONCE DAILY (Patient taking differently: Place 2 sprays into both nostrils daily.) 16 g 0   Fluticasone Furoate (ARNUITY ELLIPTA) 50 MCG/ACT AEPB        fluticasone-salmeterol (ADVAIR) 250-50 MCG/ACT AEPB Inhale 1 puff into the lungs 2 (two) times daily.     folic acid (FOLVITE) 1 MG tablet TAKE 1 TABLET BY MOUTH ONCE DAILY. 30 tablet 11   gabapentin (NEURONTIN) 600 MG tablet      ipratropium-albuterol (DUONEB) 0.5-2.5 (3) MG/3ML SOLN Inhale 3 mLs into the lungs every 4 (four) hours as needed. Supposed to be doing 4 times daily,  She has been doing as needed     lidocaine (XYLOCAINE) 2 % solution TAKE 2 TEASPOONFULS BEFORE MEALS AND AT BEDTIME AS NEEDED MAY REPEAT EVERY 4 HOURS. (MAX OF 8 DOSES PER DAY) 300 mL 0   Lidocaine HCl (XOLIDO) 2 % CREA Apply 1 application topically daily as needed (arthritisis).     losartan (COZAAR) 50 MG tablet Take 100 mg by mouth daily.     Magnesium Hydroxide (MILK OF MAGNESIA PO) Take by mouth as needed.     METAMUCIL MULTIHEALTH FIBER 58.12 % PACK Take by mouth.     metoprolol succinate (TOPROL-XL) 50 MG 24 hr tablet Take 50 mg by mouth See admin instructions. Take 1 and 1/2 tablet daily     mirtazapine (REMERON) 15 MG tablet Take 15 mg by mouth at bedtime.     montelukast (SINGULAIR) 10 MG tablet Take 10 mg by mouth daily.      nitroGLYCERIN (NITROSTAT) 0.4 MG SL tablet Place 0.4 mg under the tongue every 5 (five) minutes as needed for chest pain.     pantoprazole (PROTONIX) 40 MG tablet Take 40 mg by mouth daily.     potassium chloride (K-DUR,KLOR-CON) 10 MEQ tablet Take 10 mEq by mouth daily.     Probiotic Product (PROBIOTIC PO) Take 1 capsule by mouth daily. [facility].     sertraline (ZOLOFT) 100 MG tablet Take 200 mg by mouth daily.      Simethicone (GAS-X PO) Take 2 tablets by mouth daily as needed (gas).     simvastatin (ZOCOR) 40 MG tablet Take 40 mg by mouth at bedtime.      vitamin B-12 (CYANOCOBALAMIN) 1000 MCG tablet Take 1,000 mcg by mouth daily.     vitamin C (ASCORBIC ACID) 500 MG tablet Take 500 mg by mouth daily.     warfarin (COUMADIN) 2.5 MG tablet Take 2.5 mg by mouth See admin instructions. Take 4m Mon-Fridays and  on Saturdays and Sundays  take 2.5 mg     warfarin (COUMADIN) 5 MG tablet Take 2.5-5 mg by mouth daily. Mon -Fri   5 mg and Sat -Sun  2.5 mg     HYDROcodone-acetaminophen (NORCO/VICODIN) 5-325 MG tablet      No facility-administered medications prior to visit.    Allergies  Allergen Reactions   Amoxicillin-Pot Clavulanate Other (See Comments)   Biaxin [Clarithromycin] Other (See Comments)    Stomach problems    Erythromycin    Lisinopril Swelling    Review of Systems  Constitutional: Negative.   Respiratory: Negative.    Cardiovascular: Negative.   Musculoskeletal:  Positive for back pain.       Pain to lower back as well as stinging pain at top of gluteal cleft over her coccyx      Objective:    Physical Exam Constitutional:      Appearance: Normal appearance.  Cardiovascular:     Rate and Rhythm: Normal rate and regular rhythm.     Heart sounds: Murmur heard.  Pulmonary:     Effort: Pulmonary effort is normal.  Breath sounds: Normal breath sounds.  Musculoskeletal:     Comments: Wearing back brace; mild tenderness over coccyx; no wound or erythema present  Neurological:     Mental Status: She is alert.    BP 131/72 (BP Location: Right Arm, Patient Position: Sitting, Cuff Size: Normal)   Pulse 86   Temp 98.4 F (36.9 C)   Resp 18   Ht 5' 3" (1.6 m)   Wt 158 lb (71.7 kg)   SpO2 94%   BMI 27.99 kg/m  Wt Readings from Last 3 Encounters:  10/29/20 158 lb (71.7 kg)  10/21/20 159 lb 1.6 oz (72.2 kg)  10/01/20 152 lb (68.9 kg)    Health Maintenance Due  Topic Date Due   Pneumococcal Vaccine 0-64 Years old (1 - PCV) Never done   Zoster Vaccines- Shingrix (1 of 2) Never done   COVID-19 Vaccine (4 -  Booster for Moderna series) 09/07/2020    There are no preventive care reminders to display for this patient.   Lab Results  Component Value Date   TSH 0.644 05/02/2018   Lab Results  Component Value Date   WBC 8.8 10/21/2020   HGB 8.7 (L) 10/21/2020   HCT 27.7 (L) 10/21/2020   MCV 101.5 (H) 10/21/2020   PLT 180 10/21/2020   Lab Results  Component Value Date   NA 138 10/14/2020   K 3.4 (L) 10/14/2020   CO2 27 10/14/2020   GLUCOSE 91 10/14/2020   BUN 23 10/14/2020   CREATININE 0.91 10/14/2020   BILITOT 0.5 10/14/2020   ALKPHOS 63 10/14/2020   AST 22 10/14/2020   ALT 13 10/14/2020   PROT 10.0 (H) 10/14/2020   ALBUMIN 3.5 10/14/2020   CALCIUM 9.0 10/14/2020   ANIONGAP 7 10/14/2020   Lab Results  Component Value Date   CHOL 151 07/15/2020   Lab Results  Component Value Date   HDL 46 07/15/2020   Lab Results  Component Value Date   LDLCALC 82 07/15/2020   Lab Results  Component Value Date   TRIG 131 07/15/2020   No results found for: CHOLHDL No results found for: HGBA1C     Assessment & Plan:   Problem List Items Addressed This Visit       Cardiovascular and Mediastinum   Essential hypertension   Relevant Orders   CBC with Differential/Platelet   CMP14+EGFR   Lipid Panel With LDL/HDL Ratio     Other   Encounter for preventative adult health care examination    -she would like to be scheduled for medicare AWV       Relevant Orders   VITAMIN D 25 Hydroxy (Vit-D Deficiency, Fractures)   Buttock pain - Primary    -takes tramadol and hydrocodone for pain -we discussed offloading strategies including using pillows under her hip while she is sitting and alternating at least once per hour -no wound or erythema present on exam today- Ashley Craddock chaperoned       Other Visit Diagnoses     Age-related osteoporosis without current pathological fracture       Relevant Orders   VITAMIN D 25 Hydroxy (Vit-D Deficiency, Fractures)        No  orders of the defined types were placed in this encounter.    JOSEPH M GRAY, NP  

## 2020-11-11 ENCOUNTER — Encounter: Payer: Self-pay | Admitting: Nurse Practitioner

## 2020-11-11 ENCOUNTER — Ambulatory Visit: Payer: Medicare Other | Admitting: Nurse Practitioner

## 2020-11-11 ENCOUNTER — Other Ambulatory Visit: Payer: Self-pay

## 2020-11-11 ENCOUNTER — Ambulatory Visit (INDEPENDENT_AMBULATORY_CARE_PROVIDER_SITE_OTHER): Payer: Medicare Other

## 2020-11-11 VITALS — BP 128/77 | HR 87 | Ht 63.0 in | Wt 166.0 lb

## 2020-11-11 DIAGNOSIS — I10 Essential (primary) hypertension: Secondary | ICD-10-CM

## 2020-11-11 DIAGNOSIS — Z0000 Encounter for general adult medical examination without abnormal findings: Secondary | ICD-10-CM

## 2020-11-11 DIAGNOSIS — Z0001 Encounter for general adult medical examination with abnormal findings: Secondary | ICD-10-CM | POA: Diagnosis not present

## 2020-11-11 NOTE — Progress Notes (Signed)
Established Patient Office Visit  Subjective:  Patient ID: Courtney Rogers, female    DOB: [birth year]  Age: 70 y.o. MRN: [id number]  CC:  Chief Complaint  Patient presents with   Hypertension    Follow up   Annual Exam    AWV    HPI [name] presents for BP check.  BP has been running well at home, but she doesn't check it regularly. Denies adverse medication effects.  She has smoldering myeloma and had recent labs with Dr. Delton Coombes.  Past Medical History:  Diagnosis Date   Acute myocardial infarction [facility]) 2009   CAD/no stent medically managed   Anxiety disorder    CAD (coronary artery disease)    Hyperlipidemia    Hypertension    IBS (irritable bowel syndrome)    Non-ulcer dyspepsia 04/30/2009   Qualifier: Diagnosis of  By: Oneida Alar MD, Sandi L    Overactive bladder    PE (pulmonary embolism) JUN 2014   Presence of permanent cardiac pacemaker    Sleep apnea     Past Surgical History:  Procedure Laterality Date   ABDOMINAL HYSTERECTOMY     BSO secondary to cyst     CARDIAC CATHETERIZATION     COLONOSCOPY  12/2009   Dr. West Carbo, propofol, normal. Next TCS 12/2019   COLONOSCOPY N/A 05/22/2017   Procedure: COLONOSCOPY;  Surgeon: Danie Binder, MD;  [facility];  Service: Endoscopy;  Laterality: N/A;  10:30am   ESOPHAGOGASTRODUODENOSCOPY  05/11/09   schatzki ring/small hiatal hernia/path:gastritis   ESOPHAGOGASTRODUODENOSCOPY N/A 05/22/2017   Procedure: ESOPHAGOGASTRODUODENOSCOPY (EGD);  Surgeon: Danie Binder, MD;  [facility];  Service: Endoscopy;  Laterality: N/A;   ESOPHAGOGASTRODUODENOSCOPY (EGD) WITH ESOPHAGEAL DILATION N/A 04/01/2013   TFT:DDUKGURKY at the gastroesophageal juction/multiple small polyps/mild gastritis   GIVENS CAPSULE STUDY N/A 06/07/2017   Procedure: GIVENS CAPSULE STUDY;  Surgeon: Danie Binder, MD;  [facility];  Service: Endoscopy;  Laterality: N/A;  7:30am   INSERT / REPLACE / REMOVE  PACEMAKER     Last year per pt.(can't remember date)    Family History  Problem Relation Age of Onset   Colon polyps Neg Hx    Colon cancer Neg Hx     Social History   Socioeconomic History   Marital status: Married    Spouse name: [name]   Number of children: 3   Years of education: 16   Highest education level: Not on file  Occupational History   Not on file  Tobacco Use   Smoking status: Never   Smokeless tobacco: Never   Tobacco comments:    Never smoked   Vaping Use   Vaping Use: Never used  Substance and Sexual Activity   Alcohol use: Yes    Alcohol/week: 0.0 standard drinks    Comment: occ wine   Drug use: No   Sexual activity: Not Currently  Other Topics Concern   Not on file  Social History Narrative      Lives with husband-51 years 53 in Aug 2021   Daughter is close by, 2 sons live further away   One IN [place],ONE IN [place], [place].        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   Enjoys: reading, young adult      Diet: eats all food groups    Caffeine: coffee 1, tea daily soda-1 daily   Water: 1-2 cups      Wears seat belt  Does not use phone while driving   Oceanographer at home    Public house manager -locked up      Left handed   One story home   Drinks caffeine   Social Determinants of Health   Financial Resource Strain: Low Risk    Difficulty of Paying Living Expenses: Not hard at all  Food Insecurity: No Food Insecurity   Worried About Charity fundraiser in the Last Year: Never true   Arboriculturist in the Last Year: Never true  Transportation Needs: No Transportation Needs   Lack of Transportation (Medical): No   Lack of Transportation (Non-Medical): No  Physical Activity: Inactive   Days of Exercise per Week: 0 days   Minutes of Exercise per Session: 0 min  Stress: No Stress Concern Present   Feeling of Stress : Not at all  Social Connections: Moderately Integrated   Frequency of  Communication with Friends and Family: Once a week   Frequency of Social Gatherings with Friends and Family: Once a week   Attends Religious Services: More than 4 times per year   Active Member of [employer] or Organizations: Yes   Attends Music therapist: More than 4 times per year   Marital Status: Married  Human resources officer Violence: Not At Risk   Fear of Current or Ex-Partner: No   Emotionally Abused: No   Physically Abused: No   Sexually Abused: No    Outpatient Medications Prior to Visit  Medication Sig Dispense Refill   acetaminophen (TYLENOL) 500 MG tablet Take 1,000 mg by mouth 2 (two) times daily as needed for moderate pain or headache.     albuterol (PROVENTIL HFA;VENTOLIN HFA) 108 (90 BASE) MCG/ACT inhaler Inhale 2 puffs into the lungs every 6 (six) hours as needed for shortness of breath.     amLODipine (NORVASC) 10 MG tablet Take 1 tablet (10 mg total) by mouth daily. 90 tablet 1   aspirin EC 81 MG tablet Take 81 mg by mouth at bedtime.     B Complex Vitamins (VITAMIN B COMPLEX) TABS      busPIRone (BUSPAR) 5 MG tablet Take 10 mg by mouth daily.     Calcium Carbonate-Vitamin D (CALCIUM 600+D PO) Take 1 tablet by mouth 2 (two) times daily.     chlorthalidone (HYGROTON) 25 MG tablet Take 25 mg by mouth daily.      cholecalciferol (VITAMIN D) 25 MCG (1000 UNIT) tablet Take 1,000 Units by mouth daily.     clonazePAM (KLONOPIN) 0.5 MG tablet Take 0.5 mg by mouth 2 (two) times daily as needed.     Cyanocobalamin (B-12) 2500 MCG TABS Take 1 capsule by mouth daily.     diclofenac (CATAFLAM) 50 MG tablet Take 50 mg by mouth as needed.     famotidine (PEPCID) 20 MG tablet      fexofenadine (ALLEGRA) 180 MG tablet Take 180 mg by mouth daily.     fluticasone (FLONASE) 50 MCG/ACT nasal spray SPRAY 2 SPRAYS INTO EACH NOSTRIL ONCE DAILY (Patient taking differently: Place 2 sprays into both nostrils daily.) 16 g 0   Fluticasone Furoate (ARNUITY ELLIPTA) 50 MCG/ACT AEPB       fluticasone-salmeterol (ADVAIR) 250-50 MCG/ACT AEPB Inhale 1 puff into the lungs 2 (two) times daily.     folic acid (FOLVITE) 1 MG tablet TAKE 1 TABLET BY MOUTH ONCE DAILY. 30 tablet 11   gabapentin (NEURONTIN) 600 MG tablet  ipratropium-albuterol (DUONEB) 0.5-2.5 (3) MG/3ML SOLN Inhale 3 mLs into the lungs every 4 (four) hours as needed. Supposed to be doing 4 times daily,  She has been doing as needed     lidocaine (XYLOCAINE) 2 % solution TAKE 2 TEASPOONFULS BEFORE MEALS AND AT BEDTIME AS NEEDED MAY REPEAT EVERY 4 HOURS. (MAX OF 8 DOSES PER DAY) 300 mL 0   Lidocaine HCl (XOLIDO) 2 % CREA Apply 1 application topically daily as needed (arthritisis).     losartan (COZAAR) 50 MG tablet Take 100 mg by mouth daily.     Magnesium Hydroxide (MILK OF MAGNESIA PO) Take by mouth as needed.     METAMUCIL MULTIHEALTH FIBER 58.12 % PACK Take by mouth.     metoprolol succinate (TOPROL-XL) 50 MG 24 hr tablet Take 50 mg by mouth See admin instructions. Take 1 and 1/2 tablet daily     mirtazapine (REMERON) 15 MG tablet Take 15 mg by mouth at bedtime.     montelukast (SINGULAIR) 10 MG tablet Take 10 mg by mouth daily.      nitroGLYCERIN (NITROSTAT) 0.4 MG SL tablet Place 0.4 mg under the tongue every 5 (five) minutes as needed for chest pain.     pantoprazole (PROTONIX) 40 MG tablet Take 40 mg by mouth daily.     potassium chloride (K-DUR,KLOR-CON) 10 MEQ tablet Take 10 mEq by mouth daily.     Probiotic Product (PROBIOTIC PO) Take 1 capsule by mouth daily. [employer].     sertraline (ZOLOFT) 100 MG tablet Take 200 mg by mouth daily.      Simethicone (GAS-X PO) Take 2 tablets by mouth daily as needed (gas).     simvastatin (ZOCOR) 40 MG tablet Take 40 mg by mouth at bedtime.     vitamin B-12 (CYANOCOBALAMIN) 1000 MCG tablet Take 1,000 mcg by mouth daily.     vitamin C (ASCORBIC ACID) 500 MG tablet Take 500 mg by mouth daily.     warfarin (COUMADIN) 2.5 MG tablet Take 2.5 mg by mouth See admin  instructions. Take 5mg  Mon-Fridays and  on Saturdays and Sundays  take 2.5 mg     warfarin (COUMADIN) 5 MG tablet Take 2.5-5 mg by mouth daily. Mon -Fri   5 mg and Sat -Sun  2.5 mg     No facility-administered medications prior to visit.    Allergies  Allergen Reactions   Amoxicillin-Pot Clavulanate Other (See Comments)   Biaxin [Clarithromycin] Other (See Comments)    Stomach problems    Erythromycin    Lisinopril Swelling    ROS Review of Systems  Constitutional: Negative.   Respiratory: Negative.    Cardiovascular: Negative.   Psychiatric/Behavioral: Negative.       Objective:    Physical Exam Constitutional:      Appearance: Normal appearance.  Cardiovascular:     Rate and Rhythm: Normal rate and regular rhythm.     Pulses: Normal pulses.     Heart sounds: Normal heart sounds.  Pulmonary:     Effort: Pulmonary effort is normal.     Breath sounds: Normal breath sounds.  Neurological:     Mental Status: She is alert.  Psychiatric:        Mood and Affect: Mood normal.        Behavior: Behavior normal.        Thought Content: Thought content normal.        Judgment: Judgment normal.    BP 128/77 (BP Location: Right Arm, Patient  Position: Sitting, Cuff Size: Normal)   Pulse 87   Ht _0  (1.6 m)   Wt 166 lb (75.3 kg)   SpO2 96%   BMI 29.41 kg/m  Wt Readings from Last 3 Encounters:  11/11/20 166 lb (75.3 kg)  11/11/20 166 lb (75.3 kg)  10/29/20 158 lb (71.7 kg)     Health Maintenance Due  Topic Date Due   COVID-19 Vaccine (4 - Booster for Moderna series) 09/07/2020    There are no preventive care reminders to display for this patient.  Lab Results  Component Value Date   TSH 0.644 05/02/2018   Lab Results  Component Value Date   WBC 8.8 10/21/2020   HGB 8.7 (L) 10/21/2020   HCT 27.7 (L) 10/21/2020   MCV 101.5 (H) 10/21/2020   PLT 180 10/21/2020   Lab Results  Component Value Date   NA 138 10/14/2020   K 3.4 (L) 10/14/2020   CO2 27  10/14/2020   GLUCOSE 91 10/14/2020   BUN 23 10/14/2020   CREATININE 0.91 10/14/2020   BILITOT 0.5 10/14/2020   ALKPHOS 63 10/14/2020   AST 22 10/14/2020   ALT 13 10/14/2020   PROT 10.0 (H) 10/14/2020   ALBUMIN 3.5 10/14/2020   CALCIUM 9.0 10/14/2020   ANIONGAP 7 10/14/2020   Lab Results  Component Value Date   CHOL 151 07/15/2020   Lab Results  Component Value Date   HDL 46 07/15/2020   Lab Results  Component Value Date   LDLCALC 82 07/15/2020   Lab Results  Component Value Date   TRIG 131 07/15/2020   No results found for: CHOLHDL No results found for: HGBA1C    Assessment & Plan:   Problem List Items Addressed This Visit       Cardiovascular and Mediastinum   Essential hypertension   Relevant Orders   CMP14+EGFR   CBC with Differential/Platelet   Lipid Panel With LDL/HDL Ratio     Other   Encounter for subsequent annual wellness visit (AWV) in Medicare patient - Primary    -AWV completed today -see additional note       Other Visit Diagnoses     Encounter for Medicare annual wellness exam           No orders of the defined types were placed in this encounter.   Follow-up: Return in 4 months (on 03/13/2021) for Lab follow-up (HTN, HLD).    Noreene Larsson, NP

## 2020-11-11 NOTE — Patient Instructions (Addendum)
Courtney Rogers , Thank you for taking time to come for your Medicare Wellness Visit. I appreciate your ongoing commitment to your health goals. Please review the following plan we discussed and let me know if I can assist you in the future.   Screening recommendations/referrals: Colonoscopy: 05/23/27 Mammogram: 02/06/21 Bone Density: Complete Recommended yearly ophthalmology/optometry visit for glaucoma screening and checkup Recommended yearly dental visit for hygiene and checkup  Vaccinations: Influenza vaccine: Fall 2022 Pneumococcal vaccine: Complete Tdap vaccine: 07/15/30 Shingles vaccine: Declined    Advanced directives: None  Conditions/risks identified: No  Next appointment: AWV in one year.   Preventive Care 66 Years and Older, Female Preventive care refers to lifestyle choices and visits with your health care provider that can promote health and wellness. What does preventive care include? A yearly physical exam. This is also called an annual well check. Dental exams once or twice a year. Routine eye exams. Ask your health care provider how often you should have your eyes checked. Personal lifestyle choices, including: Daily care of your teeth and gums. Regular physical activity. Eating a healthy diet. Avoiding tobacco and drug use. Limiting alcohol use. Practicing safe sex. Taking low-dose aspirin every day. Taking vitamin and mineral supplements as recommended by your health care provider. What happens during an annual well check? The services and screenings done by your health care provider during your annual well check will depend on your age, overall health, lifestyle risk factors, and family history of disease. Counseling  Your health care provider may ask you questions about your: Alcohol use. Tobacco use. Drug use. Emotional well-being. Home and relationship well-being. Sexual activity. Eating habits. History of falls. Memory and ability to understand  (cognition). Work and work Statistician. Reproductive health. Screening  You may have the following tests or measurements: Height, weight, and BMI. Blood pressure. Lipid and cholesterol levels. These may be checked every 5 years, or more frequently if you are over 80 years old. Skin check. Lung cancer screening. You may have this screening every year starting at age 59 if you have a 30-pack-year history of smoking and currently smoke or have quit within the past 15 years. Fecal occult blood test (FOBT) of the stool. You may have this test every year starting at age 5. Flexible sigmoidoscopy or colonoscopy. You may have a sigmoidoscopy every 5 years or a colonoscopy every 10 years starting at age 72. Hepatitis C blood test. Hepatitis B blood test. Sexually transmitted disease (STD) testing. Diabetes screening. This is done by checking your blood sugar (glucose) after you have not eaten for a while (fasting). You may have this done every 1-3 years. Bone density scan. This is done to screen for osteoporosis. You may have this done starting at age 76. Mammogram. This may be done every 1-2 years. Talk to your health care provider about how often you should have regular mammograms. Talk with your health care provider about your test results, treatment options, and if necessary, the need for more tests. Vaccines  Your health care provider may recommend certain vaccines, such as: Influenza vaccine. This is recommended every year. Tetanus, diphtheria, and acellular pertussis (Tdap, Td) vaccine. You may need a Td booster every 10 years. Zoster vaccine. You may need this after age 44. Pneumococcal 13-valent conjugate (PCV13) vaccine. One dose is recommended after age 58. Pneumococcal polysaccharide (PPSV23) vaccine. One dose is recommended after age 60. Talk to your health care provider about which screenings and vaccines you need and how often you need them.  This information is not intended to  replace advice given to you by your health care provider. Make sure you discuss any questions you have with your health care provider. Document Released: 06/05/2015 Document Revised: 01/27/2016 Document Reviewed: 03/10/2015 Elsevier Interactive Patient Education  2017 [place] Prevention in the Home Falls can cause injuries. They can happen to people of all ages. There are many things you can do to make your home safe and to help prevent falls. What can I do on the outside of my home? Regularly fix the edges of walkways and driveways and fix any cracks. Remove anything that might make you trip as you walk through a door, such as a raised step or threshold. Trim any bushes or trees on the path to your home. Use bright outdoor lighting. Clear any walking paths of anything that might make someone trip, such as rocks or tools. Regularly check to see if handrails are loose or broken. Make sure that both sides of any steps have handrails. Any raised decks and porches should have guardrails on the edges. Have any leaves, snow, or ice cleared regularly. Use sand or salt on walking paths during winter. Clean up any spills in your garage right away. This includes oil or grease spills. What can I do in the bathroom? Use night lights. Install grab bars by the toilet and in the tub and shower. Do not use towel bars as grab bars. Use non-skid mats or decals in the tub or shower. If you need to sit down in the shower, use a plastic, non-slip stool. Keep the floor dry. Clean up any water that spills on the floor as soon as it happens. Remove soap buildup in the tub or shower regularly. Attach bath mats securely with double-sided non-slip rug tape. Do not have throw rugs and other things on the floor that can make you trip. What can I do in the bedroom? Use night lights. Make sure that you have a light by your bed that is easy to reach. Do not use any sheets or blankets that are too big for  your bed. They should not hang down onto the floor. Have a firm chair that has side arms. You can use this for support while you get dressed. Do not have throw rugs and other things on the floor that can make you trip. What can I do in the kitchen? Clean up any spills right away. Avoid walking on wet floors. Keep items that you use a lot in easy-to-reach places. If you need to reach something above you, use a strong step stool that has a grab bar. Keep electrical cords out of the way. Do not use floor polish or wax that makes floors slippery. If you must use wax, use non-skid floor wax. Do not have throw rugs and other things on the floor that can make you trip. What can I do with my stairs? Do not leave any items on the stairs. Make sure that there are handrails on both sides of the stairs and use them. Fix handrails that are broken or loose. Make sure that handrails are as long as the stairways. Check any carpeting to make sure that it is firmly attached to the stairs. Fix any carpet that is loose or worn. Avoid having throw rugs at the top or bottom of the stairs. If you do have throw rugs, attach them to the floor with carpet tape. Make sure that you have a light switch at the  top of the stairs and the bottom of the stairs. If you do not have them, ask someone to add them for you. What else can I do to help prevent falls? Wear shoes that: Do not have high heels. Have rubber bottoms. Are comfortable and fit you well. Are closed at the toe. Do not wear sandals. If you use a stepladder: Make sure that it is fully opened. Do not climb a closed stepladder. Make sure that both sides of the stepladder are locked into place. Ask someone to hold it for you, if possible. Clearly mark and make sure that you can see: Any grab bars or handrails. First and last steps. Where the edge of each step is. Use tools that help you move around (mobility aids) if they are needed. These  include: Canes. Walkers. Scooters. Crutches. Turn on the lights when you go into a dark area. Replace any light bulbs as soon as they burn out. Set up your furniture so you have a clear path. Avoid moving your furniture around. If any of your floors are uneven, fix them. If there are any pets around you, be aware of where they are. Review your medicines with your doctor. Some medicines can make you feel dizzy. This can increase your chance of falling. Ask your doctor what other things that you can do to help prevent falls. This information is not intended to replace advice given to you by your health care provider. Make sure you discuss any questions you have with your health care provider. Document Released: 03/05/2009 Document Revised: 10/15/2015 Document Reviewed: 06/13/2014 Elsevier Interactive Patient Education  2017 [employer].

## 2020-11-11 NOTE — Assessment & Plan Note (Addendum)
-  AWV completed today -see additional note

## 2020-11-11 NOTE — Patient Instructions (Signed)
Courtney Rogers , Thank you for taking time to come for your Medicare Wellness Visit. I appreciate your ongoing commitment to your health goals. Please review the following plan we discussed and let me know if I can assist you in the future.   Screening recommendations/referrals: Colonoscopy: 05/23/27 Mammogram: 02/06/21 Bone Density: Complete  Recommended yearly ophthalmology/optometry visit for glaucoma screening and checkup Recommended yearly dental visit for hygiene and checkup  Vaccinations: Influenza vaccine: Fall 2022 Pneumococcal vaccine: Complete Tdap vaccine: 07/15/30 Shingles vaccine: Declined    Advanced directives: No  Conditions/risks identified: None  Next appointment: AWV in one year.   Preventive Care 62 Years and Older, Female Preventive care refers to lifestyle choices and visits with your health care provider that can promote health and wellness. What does preventive care include? A yearly physical exam. This is also called an annual well check. Dental exams once or twice a year. Routine eye exams. Ask your health care provider how often you should have your eyes checked. Personal lifestyle choices, including: Daily care of your teeth and gums. Regular physical activity. Eating a healthy diet. Avoiding tobacco and drug use. Limiting alcohol use. Practicing safe sex. Taking low-dose aspirin every day. Taking vitamin and mineral supplements as recommended by your health care provider. What happens during an annual well check? The services and screenings done by your health care provider during your annual well check will depend on your age, overall health, lifestyle risk factors, and family history of disease. Counseling  Your health care provider may ask you questions about your: Alcohol use. Tobacco use. Drug use. Emotional well-being. Home and relationship well-being. Sexual activity. Eating habits. History of falls. Memory and ability to understand  (cognition). Work and work Statistician. Reproductive health. Screening  You may have the following tests or measurements: Height, weight, and BMI. Blood pressure. Lipid and cholesterol levels. These may be checked every 5 years, or more frequently if you are over 61 years old. Skin check. Lung cancer screening. You may have this screening every year starting at age 72 if you have a 30-pack-year history of smoking and currently smoke or have quit within the past 15 years. Fecal occult blood test (FOBT) of the stool. You may have this test every year starting at age 72. Flexible sigmoidoscopy or colonoscopy. You may have a sigmoidoscopy every 5 years or a colonoscopy every 10 years starting at age 72. Hepatitis C blood test. Hepatitis B blood test. Sexually transmitted disease (STD) testing. Diabetes screening. This is done by checking your blood sugar (glucose) after you have not eaten for a while (fasting). You may have this done every 1-3 years. Bone density scan. This is done to screen for osteoporosis. You may have this done starting at age 72. Mammogram. This may be done every 1-2 years. Talk to your health care provider about how often you should have regular mammograms. Talk with your health care provider about your test results, treatment options, and if necessary, the need for more tests. Vaccines  Your health care provider may recommend certain vaccines, such as: Influenza vaccine. This is recommended every year. Tetanus, diphtheria, and acellular pertussis (Tdap, Td) vaccine. You may need a Td booster every 10 years. Zoster vaccine. You may need this after age 51. Pneumococcal 13-valent conjugate (PCV13) vaccine. One dose is recommended after age 39. Pneumococcal polysaccharide (PPSV23) vaccine. One dose is recommended after age 23. Talk to your health care provider about which screenings and vaccines you need and how often you need  them. This information is not intended to  replace advice given to you by your health care provider. Make sure you discuss any questions you have with your health care provider. Document Released: 06/05/2015 Document Revised: 01/27/2016 Document Reviewed: 03/10/2015 Elsevier Interactive Patient Education  2017 [place] Prevention in the Home Falls can cause injuries. They can happen to people of all ages. There are many things you can do to make your home safe and to help prevent falls. What can I do on the outside of my home? Regularly fix the edges of walkways and driveways and fix any cracks. Remove anything that might make you trip as you walk through a door, such as a raised step or threshold. Trim any bushes or trees on the path to your home. Use bright outdoor lighting. Clear any walking paths of anything that might make someone trip, such as rocks or tools. Regularly check to see if handrails are loose or broken. Make sure that both sides of any steps have handrails. Any raised decks and porches should have guardrails on the edges. Have any leaves, snow, or ice cleared regularly. Use sand or salt on walking paths during winter. Clean up any spills in your garage right away. This includes oil or grease spills. What can I do in the bathroom? Use night lights. Install grab bars by the toilet and in the tub and shower. Do not use towel bars as grab bars. Use non-skid mats or decals in the tub or shower. If you need to sit down in the shower, use a plastic, non-slip stool. Keep the floor dry. Clean up any water that spills on the floor as soon as it happens. Remove soap buildup in the tub or shower regularly. Attach bath mats securely with double-sided non-slip rug tape. Do not have throw rugs and other things on the floor that can make you trip. What can I do in the bedroom? Use night lights. Make sure that you have a light by your bed that is easy to reach. Do not use any sheets or blankets that are too big for  your bed. They should not hang down onto the floor. Have a firm chair that has side arms. You can use this for support while you get dressed. Do not have throw rugs and other things on the floor that can make you trip. What can I do in the kitchen? Clean up any spills right away. Avoid walking on wet floors. Keep items that you use a lot in easy-to-reach places. If you need to reach something above you, use a strong step stool that has a grab bar. Keep electrical cords out of the way. Do not use floor polish or wax that makes floors slippery. If you must use wax, use non-skid floor wax. Do not have throw rugs and other things on the floor that can make you trip. What can I do with my stairs? Do not leave any items on the stairs. Make sure that there are handrails on both sides of the stairs and use them. Fix handrails that are broken or loose. Make sure that handrails are as long as the stairways. Check any carpeting to make sure that it is firmly attached to the stairs. Fix any carpet that is loose or worn. Avoid having throw rugs at the top or bottom of the stairs. If you do have throw rugs, attach them to the floor with carpet tape. Make sure that you have a light switch at  the top of the stairs and the bottom of the stairs. If you do not have them, ask someone to add them for you. What else can I do to help prevent falls? Wear shoes that: Do not have high heels. Have rubber bottoms. Are comfortable and fit you well. Are closed at the toe. Do not wear sandals. If you use a stepladder: Make sure that it is fully opened. Do not climb a closed stepladder. Make sure that both sides of the stepladder are locked into place. Ask someone to hold it for you, if possible. Clearly mark and make sure that you can see: Any grab bars or handrails. First and last steps. Where the edge of each step is. Use tools that help you move around (mobility aids) if they are needed. These  include: Canes. Walkers. Scooters. Crutches. Turn on the lights when you go into a dark area. Replace any light bulbs as soon as they burn out. Set up your furniture so you have a clear path. Avoid moving your furniture around. If any of your floors are uneven, fix them. If there are any pets around you, be aware of where they are. Review your medicines with your doctor. Some medicines can make you feel dizzy. This can increase your chance of falling. Ask your doctor what other things that you can do to help prevent falls. This information is not intended to replace advice given to you by your health care provider. Make sure you discuss any questions you have with your health care provider. Document Released: 03/05/2009 Document Revised: 10/15/2015 Document Reviewed: 06/13/2014 Elsevier Interactive Patient Education  2017 [employer].

## 2020-11-11 NOTE — Progress Notes (Signed)
Subjective:   Courtney Rogers is a 70 y.o. female who presents for Medicare Annual (Subsequent) preventive examination.  Review of Systems       Objective:    Today's Vitals   11/11/20 0912  BP: 128/77  Pulse: 87  SpO2: 96%  Weight: 166 lb (75.3 kg)  Height: 5\' 3"  (1.6 m)  PainSc: 0-No pain   Body mass index is 29.41 kg/m.  Advanced Directives 10/21/2020 10/02/2020 06/15/2020 04/14/2020 04/02/2020 03/25/2020 03/11/2020  Does Patient Have a Medical Advance Directive? No No No No No No No  Does patient want to make changes to medical advance directive? - - - - - - -  Would patient like information on creating a medical advance directive? No - Patient declined No - Patient declined - No - Patient declined - No - Patient declined No - Patient declined  Pre-existing out of facility DNR order (yellow form or pink MOST form) - - - - - - -    Current Medications (verified) Outpatient Encounter Medications as of 11/11/2020  Medication Sig   acetaminophen (TYLENOL) 500 MG tablet Take 1,000 mg by mouth 2 (two) times daily as needed for moderate pain or headache.   albuterol (PROVENTIL HFA;VENTOLIN HFA) 108 (90 BASE) MCG/ACT inhaler Inhale 2 puffs into the lungs every 6 (six) hours as needed for shortness of breath.   amLODipine (NORVASC) 10 MG tablet Take 1 tablet (10 mg total) by mouth daily.   aspirin EC 81 MG tablet Take 81 mg by mouth at bedtime.   B Complex Vitamins (VITAMIN B COMPLEX) TABS    busPIRone (BUSPAR) 5 MG tablet Take 10 mg by mouth daily.   Calcium Carbonate-Vitamin D (CALCIUM 600+D PO) Take 1 tablet by mouth 2 (two) times daily.   chlorthalidone (HYGROTON) 25 MG tablet Take 25 mg by mouth daily.    cholecalciferol (VITAMIN D) 25 MCG (1000 UNIT) tablet Take 1,000 Units by mouth daily.   clonazePAM (KLONOPIN) 0.5 MG tablet Take 0.5 mg by mouth 2 (two) times daily as needed.   Cyanocobalamin (B-12) 2500 MCG TABS Take 1 capsule by mouth daily.   diclofenac (CATAFLAM) 50  MG tablet Take 50 mg by mouth as needed.   famotidine (PEPCID) 20 MG tablet    fexofenadine (ALLEGRA) 180 MG tablet Take 180 mg by mouth daily.   fluticasone (FLONASE) 50 MCG/ACT nasal spray SPRAY 2 SPRAYS INTO EACH NOSTRIL ONCE DAILY (Patient taking differently: Place 2 sprays into both nostrils daily.)   Fluticasone Furoate (ARNUITY ELLIPTA) 50 MCG/ACT AEPB    fluticasone-salmeterol (ADVAIR) 250-50 MCG/ACT AEPB Inhale 1 puff into the lungs 2 (two) times daily.   folic acid (FOLVITE) 1 MG tablet TAKE 1 TABLET BY MOUTH ONCE DAILY.   gabapentin (NEURONTIN) 600 MG tablet    ipratropium-albuterol (DUONEB) 0.5-2.5 (3) MG/3ML SOLN Inhale 3 mLs into the lungs every 4 (four) hours as needed. Supposed to be doing 4 times daily,  She has been doing as needed   lidocaine (XYLOCAINE) 2 % solution TAKE 2 TEASPOONFULS BEFORE MEALS AND AT BEDTIME AS NEEDED MAY REPEAT EVERY 4 HOURS. (MAX OF 8 DOSES PER DAY)   Lidocaine HCl (XOLIDO) 2 % CREA Apply 1 application topically daily as needed (arthritisis).   losartan (COZAAR) 50 MG tablet Take 100 mg by mouth daily.   Magnesium Hydroxide (MILK OF MAGNESIA PO) Take by mouth as needed.   METAMUCIL MULTIHEALTH FIBER 58.12 % PACK Take by mouth.   metoprolol succinate (TOPROL-XL) 50 MG 24  hr tablet Take 50 mg by mouth See admin instructions. Take 1 and 1/2 tablet daily   mirtazapine (REMERON) 15 MG tablet Take 15 mg by mouth at bedtime.   montelukast (SINGULAIR) 10 MG tablet Take 10 mg by mouth daily.    nitroGLYCERIN (NITROSTAT) 0.4 MG SL tablet Place 0.4 mg under the tongue every 5 (five) minutes as needed for chest pain.   pantoprazole (PROTONIX) 40 MG tablet Take 40 mg by mouth daily.   potassium chloride (K-DUR,KLOR-CON) 10 MEQ tablet Take 10 mEq by mouth daily.   Probiotic Product (PROBIOTIC PO) Take 1 capsule by mouth daily. [facility].   sertraline (ZOLOFT) 100 MG tablet Take 200 mg by mouth daily.    Simethicone (GAS-X PO) Take 2 tablets by mouth  daily as needed (gas).   simvastatin (ZOCOR) 40 MG tablet Take 40 mg by mouth at bedtime.   vitamin B-12 (CYANOCOBALAMIN) 1000 MCG tablet Take 1,000 mcg by mouth daily.   vitamin C (ASCORBIC ACID) 500 MG tablet Take 500 mg by mouth daily.   warfarin (COUMADIN) 2.5 MG tablet Take 2.5 mg by mouth See admin instructions. Take 5mg  Mon-Fridays and  on Saturdays and Sundays  take 2.5 mg   warfarin (COUMADIN) 5 MG tablet Take 2.5-5 mg by mouth daily. Mon -Fri   5 mg and Sat -Sun  2.5 mg   No facility-administered encounter medications on file as of 11/11/2020.    Allergies (verified) Amoxicillin-pot clavulanate, Biaxin [clarithromycin], Erythromycin, and Lisinopril   History: Past Medical History:  Diagnosis Date   Acute myocardial infarction [facility]) 2009   CAD/no stent medically managed   Anxiety disorder    CAD (coronary artery disease)    Hyperlipidemia    Hypertension    IBS (irritable bowel syndrome)    Non-ulcer dyspepsia 04/30/2009   Qualifier: Diagnosis of  By: Oneida Alar MD, Sandi L    Overactive bladder    PE (pulmonary embolism) JUN 2014   Presence of permanent cardiac pacemaker    Sleep apnea    Past Surgical History:  Procedure Laterality Date   ABDOMINAL HYSTERECTOMY     BSO secondary to cyst     CARDIAC CATHETERIZATION     COLONOSCOPY  12/2009   Dr. West Carbo, propofol, normal. Next TCS 12/2019   COLONOSCOPY N/A 05/22/2017   Procedure: COLONOSCOPY;  Surgeon: Danie Binder, MD;  [facility]: [facility];  Service: Endoscopy;  Laterality: N/A;  10:30am   ESOPHAGOGASTRODUODENOSCOPY  05/11/09   schatzki ring/small hiatal hernia/path:gastritis   ESOPHAGOGASTRODUODENOSCOPY N/A 05/22/2017   Procedure: ESOPHAGOGASTRODUODENOSCOPY (EGD);  Surgeon: Danie Binder, MD;  Location: [facility];  Service: Endoscopy;  Laterality: N/A;   ESOPHAGOGASTRODUODENOSCOPY (EGD) WITH ESOPHAGEAL DILATION N/A 04/01/2013   [id number] at the gastroesophageal juction/multiple small polyps/mild  gastritis   GIVENS CAPSULE STUDY N/A 06/07/2017   Procedure: GIVENS CAPSULE STUDY;  Surgeon: Danie Binder, MD;  Location: [facility];  Service: Endoscopy;  Laterality: N/A;  7:30am   INSERT / REPLACE / REMOVE PACEMAKER     Last year per pt.(can't remember date)   Family History  Problem Relation Age of Onset   Colon polyps Neg Hx    Colon cancer Neg Hx    Social History   Socioeconomic History   Marital status: Married    Spouse name: [name]   Number of children: 3   Years of education: 16   Highest education level: Not on file  Occupational History   Not on file  Tobacco  Use   Smoking status: Never   Smokeless tobacco: Never   Tobacco comments:    Never smoked   Vaping Use   Vaping Use: Never used  Substance and Sexual Activity   Alcohol use: Yes    Alcohol/week: 0.0 standard drinks    Comment: occ wine   Drug use: No   Sexual activity: Not Currently  Other Topics Concern   Not on file  Social History Narrative      Lives with husband-51 years 83 in Aug 2021   Daughter is close by, 2 sons live further away   One IN [place],ONE IN [place], [place].        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   Enjoys: reading, young adult      Diet: eats all food groups    Caffeine: coffee 1, tea daily soda-1 daily   Water: 1-2 cups      Wears seat belt    Does not use phone while driving   Oceanographer at home    Public house manager -locked up      Left handed   One story home   Drinks caffeine   Social Determinants of Health   Financial Resource Strain: Low Risk    Difficulty of Paying Living Expenses: Not hard at all  Food Insecurity: No Food Insecurity   Worried About Charity fundraiser in the Last Year: Never true   Arboriculturist in the Last Year: Never true  Transportation Needs: No Transportation Needs   Lack of Transportation (Medical): No   Lack of Transportation (Non-Medical): No  Physical Activity: Inactive   Days  of Exercise per Week: 0 days   Minutes of Exercise per Session: 0 min  Stress: No Stress Concern Present   Feeling of Stress : Not at all  Social Connections: Moderately Integrated   Frequency of Communication with Friends and Family: More than three times a week   Frequency of Social Gatherings with Friends and Family: More than three times a week   Attends Religious Services: More than 4 times per year   Active Member of [employer] or Organizations: No   Attends Archivist Meetings: Never   Marital Status: Married    Tobacco Counseling Counseling given: Yes Tobacco comments: Never smoked    Clinical Intake:     Pain Score: 0-No pain           Diabetic? no         Activities of Daily Living In your present state of health, do you have any difficulty performing the following activities: 11/27/2019  Hearing? N  Vision? N  Difficulty concentrating or making decisions? N  Walking or climbing stairs? Y  Dressing or bathing? N  Doing errands, shopping? N  Some recent data might be hidden    Patient Care Team: Noreene Larsson, NP as PCP - General (Nurse Practitioner) Derek Jack, MD as Medical Oncologist (Oncology) Donetta Potts, RN as Oncology Nurse Navigator (Oncology) Eloise Harman, DO as Consulting Physician (Internal Medicine)  Indicate any recent Medical Services you may have received from other than [facility] providers in the past year (date may be approximate).     Assessment:   This is a routine wellness examination for [facility].  Hearing/Vision screen No results found.  Dietary issues and exercise activities discussed:     Goals Addressed   None   Depression Screen [facility] 2/9 Scores 11/11/2020 10/29/2020  10/01/2020 07/15/2020 04/30/2020 02/27/2020 01/02/2020  PHQ - 2 Score 0 0 1 0 0 0 0  PHQ- 9 Score - - - - - - 0    Fall Risk Fall Risk  11/11/2020 10/29/2020 10/01/2020 07/15/2020 06/15/2020  Falls in the past year? 0 0 0 1 1  Number falls  in past yr: 0 0 0 0 1  Injury with Fall? 0 0 0 0 0  Risk for fall due to : No Fall Risks No Fall Risks No Fall Risks Impaired mobility -  Follow up Falls evaluation completed Falls evaluation completed Falls evaluation completed Falls evaluation completed -    FALL RISK PREVENTION PERTAINING TO THE HOME:  Any stairs in or around the home? Yes  If so, are there any without handrails? No  Home free of loose throw rugs in walkways, pet beds, electrical cords, etc? Yes  Adequate lighting in your home to reduce risk of falls? Yes   ASSISTIVE DEVICES UTILIZED TO PREVENT FALLS:  Life alert? Yes  Use of a cane, walker or w/c? Yes  Grab bars in the bathroom? No  Shower chair or bench in shower? Yes  Elevated toilet seat or a handicapped toilet? Yes   TIMED UP AND GO:  Was the test performed? No .  Length of time to ambulate n/a    Cognitive Function:        Immunizations Immunization History  Administered Date(s) Administered   Fluad Quad(high Dose 65+) 02/22/2020   Influenza, High Dose Seasonal PF 01/23/2019   Influenza,inj,quad, With Preservative 02/09/2018   Moderna Sars-Covid-2 Vaccination 06/13/2019, 07/10/2019, 06/09/2020   Pneumococcal Conjugate-13 12/14/2017   Pneumococcal Polysaccharide-23 07/15/2020   Tdap 07/15/2020    TDAP status: Up to date  Flu Vaccine status: Up to date  Pneumococcal vaccine status: Up to date  Covid-19 vaccine status: Completed vaccines  Qualifies for Shingles Vaccine? Yes   Zostavax completed No   Shingrix Completed?: No.    Education has been provided regarding the importance of this vaccine. Patient has been advised to call insurance company to determine out of pocket expense if they have not yet received this vaccine. Advised may also receive vaccine at local pharmacy or Health Dept. Verbalized acceptance and understanding.  Screening Tests Health Maintenance  Topic Date Due   COVID-19 Vaccine (4 - Booster for Moderna series)  09/07/2020   Zoster Vaccines- Shingrix (1 of 2) 02/11/2021 (Originally 02/01/1970)   INFLUENZA VACCINE  12/21/2020   MAMMOGRAM  02/06/2022   COLONOSCOPY (Pts 45-59yrs Insurance coverage will need to be confirmed)  05/23/2027   TETANUS/TDAP  07/15/2030   DEXA SCAN  Completed   Hepatitis C Screening  Completed   PNA vac Low Risk Adult  Completed   HPV VACCINES  Aged Out    Health Maintenance  Health Maintenance Due  Topic Date Due   COVID-19 Vaccine (4 - Booster for Moderna series) 09/07/2020    Colorectal cancer screening: Type of screening: Colonoscopy. Completed 05/22/17. Repeat every 10 years  Mammogram status: Completed 02/07/20. Repeat every year  Bone Density status: Completed 02/07/20. Results reflect: Bone density results: NORMAL. Repeat every 5 years.  Lung Cancer Screening: (Low Dose CT Chest recommended if Age 26-80 years, 30 pack-year currently smoking OR have quit w/in 15years.) does not qualify.   Lung Cancer Screening Referral: n/a  Additional Screening:  Hepatitis C Screening: does not qualify; Completed  Vision Screening: Recommended annual ophthalmology exams for early detection of glaucoma and other disorders of the eye.  Is the patient up to date with their annual eye exam?  Yes  Who is the provider or what is the name of the office in which the patient attends annual eye exams? Dr. Jorja Loa If pt is not established with a provider, would they like to be referred to a provider to establish care?  N/a .   Dental Screening: Recommended annual dental exams for proper oral hygiene  Community Resource Referral / Chronic Care Management: CRR required this visit?  No   CCM required this visit?  No      Plan:     I have personally reviewed and noted the following in the patient's chart:   Medical and social history Use of alcohol, tobacco or illicit drugs  Current medications and supplements including opioid prescriptions.  Functional ability and  status Nutritional status Physical activity Advanced directives List of other physicians Hospitalizations, surgeries, and ER visits in previous 12 months Vitals Screenings to include cognitive, depression, and falls Referrals and appointments  In addition, I have reviewed and discussed with patient certain preventive protocols, quality metrics, and best practice recommendations. A written personalized care plan for preventive services as well as general preventive health recommendations were provided to patient.     Laretta Bolster, [place]   4/37/3578   Nurse Notes: AWV conducted by nurse in office. Patient gave consent to this visit. Patient and provider both here in the office. Visit took 25 minutes to complete.

## 2020-11-11 NOTE — Progress Notes (Signed)
Subjective:   Courtney Rogers is a 70 y.o. female who presents for Medicare Annual (Subsequent) preventive examination.  Review of Systems       Objective:    There were no vitals filed for this visit. There is no height or weight on file to calculate BMI.  Advanced Directives 11/11/2020 10/21/2020 10/02/2020 06/15/2020 04/14/2020 04/02/2020 03/25/2020  Does Patient Have a Medical Advance Directive? No No No No No No No  Does patient want to make changes to medical advance directive? - - - - - - -  Would patient like information on creating a medical advance directive? No - Patient declined No - Patient declined No - Patient declined - No - Patient declined - No - Patient declined  Pre-existing out of facility DNR order (yellow form or pink MOST form) - - - - - - -    Current Medications (verified) Outpatient Encounter Medications as of 11/11/2020  Medication Sig   acetaminophen (TYLENOL) 500 MG tablet Take 1,000 mg by mouth 2 (two) times daily as needed for moderate pain or headache.   albuterol (PROVENTIL HFA;VENTOLIN HFA) 108 (90 BASE) MCG/ACT inhaler Inhale 2 puffs into the lungs every 6 (six) hours as needed for shortness of breath.   amLODipine (NORVASC) 10 MG tablet Take 1 tablet (10 mg total) by mouth daily.   aspirin EC 81 MG tablet Take 81 mg by mouth at bedtime.   B Complex Vitamins (VITAMIN B COMPLEX) TABS    busPIRone (BUSPAR) 5 MG tablet Take 10 mg by mouth daily.   Calcium Carbonate-Vitamin D (CALCIUM 600+D PO) Take 1 tablet by mouth 2 (two) times daily.   chlorthalidone (HYGROTON) 25 MG tablet Take 25 mg by mouth daily.    cholecalciferol (VITAMIN D) 25 MCG (1000 UNIT) tablet Take 1,000 Units by mouth daily.   clonazePAM (KLONOPIN) 0.5 MG tablet Take 0.5 mg by mouth 2 (two) times daily as needed.   Cyanocobalamin (B-12) 2500 MCG TABS Take 1 capsule by mouth daily.   diclofenac (CATAFLAM) 50 MG tablet Take 50 mg by mouth as needed.   famotidine (PEPCID) 20 MG tablet     fexofenadine (ALLEGRA) 180 MG tablet Take 180 mg by mouth daily.   fluticasone (FLONASE) 50 MCG/ACT nasal spray SPRAY 2 SPRAYS INTO EACH NOSTRIL ONCE DAILY (Patient taking differently: Place 2 sprays into both nostrils daily.)   Fluticasone Furoate (ARNUITY ELLIPTA) 50 MCG/ACT AEPB    fluticasone-salmeterol (ADVAIR) 250-50 MCG/ACT AEPB Inhale 1 puff into the lungs 2 (two) times daily.   folic acid (FOLVITE) 1 MG tablet TAKE 1 TABLET BY MOUTH ONCE DAILY.   gabapentin (NEURONTIN) 600 MG tablet    ipratropium-albuterol (DUONEB) 0.5-2.5 (3) MG/3ML SOLN Inhale 3 mLs into the lungs every 4 (four) hours as needed. Supposed to be doing 4 times daily,  She has been doing as needed   lidocaine (XYLOCAINE) 2 % solution TAKE 2 TEASPOONFULS BEFORE MEALS AND AT BEDTIME AS NEEDED MAY REPEAT EVERY 4 HOURS. (MAX OF 8 DOSES PER DAY)   Lidocaine HCl (XOLIDO) 2 % CREA Apply 1 application topically daily as needed (arthritisis).   losartan (COZAAR) 50 MG tablet Take 100 mg by mouth daily.   Magnesium Hydroxide (MILK OF MAGNESIA PO) Take by mouth as needed.   METAMUCIL MULTIHEALTH FIBER 58.12 % PACK Take by mouth.   metoprolol succinate (TOPROL-XL) 50 MG 24 hr tablet Take 50 mg by mouth See admin instructions. Take 1 and 1/2 tablet daily   mirtazapine (REMERON)  15 MG tablet Take 15 mg by mouth at bedtime.   montelukast (SINGULAIR) 10 MG tablet Take 10 mg by mouth daily.    nitroGLYCERIN (NITROSTAT) 0.4 MG SL tablet Place 0.4 mg under the tongue every 5 (five) minutes as needed for chest pain.   pantoprazole (PROTONIX) 40 MG tablet Take 40 mg by mouth daily.   potassium chloride (K-DUR,KLOR-CON) 10 MEQ tablet Take 10 mEq by mouth daily.   Probiotic Product (PROBIOTIC PO) Take 1 capsule by mouth daily. [facility].   sertraline (ZOLOFT) 100 MG tablet Take 200 mg by mouth daily.    Simethicone (GAS-X PO) Take 2 tablets by mouth daily as needed (gas).   simvastatin (ZOCOR) 40 MG tablet Take 40 mg by mouth at  bedtime.   vitamin B-12 (CYANOCOBALAMIN) 1000 MCG tablet Take 1,000 mcg by mouth daily.   vitamin C (ASCORBIC ACID) 500 MG tablet Take 500 mg by mouth daily.   warfarin (COUMADIN) 2.5 MG tablet Take 2.5 mg by mouth See admin instructions. Take 5mg  Mon-Fridays and  on Saturdays and Sundays  take 2.5 mg   warfarin (COUMADIN) 5 MG tablet Take 2.5-5 mg by mouth daily. Mon -Fri   5 mg and Sat -Sun  2.5 mg   No facility-administered encounter medications on file as of 11/11/2020.    Allergies (verified) Amoxicillin-pot clavulanate, Biaxin [clarithromycin], Erythromycin, and Lisinopril   History: Past Medical History:  Diagnosis Date   Acute myocardial infarction [facility]) 2009   CAD/no stent medically managed   Anxiety disorder    CAD (coronary artery disease)    Hyperlipidemia    Hypertension    IBS (irritable bowel syndrome)    Non-ulcer dyspepsia 04/30/2009   Qualifier: Diagnosis of  By: Oneida Alar MD, Sandi L    Overactive bladder    PE (pulmonary embolism) JUN 2014   Presence of permanent cardiac pacemaker    Sleep apnea    Past Surgical History:  Procedure Laterality Date   ABDOMINAL HYSTERECTOMY     BSO secondary to cyst     CARDIAC CATHETERIZATION     COLONOSCOPY  12/2009   Dr. West Carbo, propofol, normal. Next TCS 12/2019   COLONOSCOPY N/A 05/22/2017   Procedure: COLONOSCOPY;  Surgeon: Danie Binder, MD;  Location: [facility];  Service: Endoscopy;  Laterality: N/A;  10:30am   ESOPHAGOGASTRODUODENOSCOPY  05/11/09   schatzki ring/small hiatal hernia/path:gastritis   ESOPHAGOGASTRODUODENOSCOPY N/A 05/22/2017   Procedure: ESOPHAGOGASTRODUODENOSCOPY (EGD);  Surgeon: Danie Binder, MD;  Location: [facility];  Service: Endoscopy;  Laterality: N/A;   ESOPHAGOGASTRODUODENOSCOPY (EGD) WITH ESOPHAGEAL DILATION N/A 04/01/2013   [id number] at the gastroesophageal juction/multiple small polyps/mild gastritis   GIVENS CAPSULE STUDY N/A 06/07/2017   Procedure: GIVENS CAPSULE STUDY;   Surgeon: Danie Binder, MD;  Location: [facility];  Service: Endoscopy;  Laterality: N/A;  7:30am   INSERT / REPLACE / REMOVE PACEMAKER     Last year per pt.(can't remember date)   Family History  Problem Relation Age of Onset   Colon polyps Neg Hx    Colon cancer Neg Hx    Social History   Socioeconomic History   Marital status: Married    Spouse name: [name]   Number of children: 3   Years of education: 16   Highest education level: Not on file  Occupational History   Not on file  Tobacco Use   Smoking status: Never   Smokeless tobacco: Never   Tobacco comments:    Never smoked  Vaping Use   Vaping Use: Never used  Substance and Sexual Activity   Alcohol use: Yes    Alcohol/week: 0.0 standard drinks    Comment: occ wine   Drug use: No   Sexual activity: Not Currently  Other Topics Concern   Not on file  Social History Narrative      Lives with husband-51 years 28 in Aug 2021   Daughter is close by, 2 sons live further away   One IN [place],ONE IN [place], [place].        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   Enjoys: reading, young adult      Diet: eats all food groups    Caffeine: coffee 1, tea daily soda-1 daily   Water: 1-2 cups      Wears seat belt    Does not use phone while driving   Oceanographer at home    Public house manager -locked up      Left handed   One story home   Drinks caffeine   Social Determinants of Health   Financial Resource Strain: Low Risk    Difficulty of Paying Living Expenses: Not hard at all  Food Insecurity: No Food Insecurity   Worried About Charity fundraiser in the Last Year: Never true   Arboriculturist in the Last Year: Never true  Transportation Needs: No Transportation Needs   Lack of Transportation (Medical): No   Lack of Transportation (Non-Medical): No  Physical Activity: Inactive   Days of Exercise per Week: 0 days   Minutes of Exercise per Session: 0 min  Stress: No  Stress Concern Present   Feeling of Stress : Not at all  Social Connections: Moderately Integrated   Frequency of Communication with Friends and Family: Once a week   Frequency of Social Gatherings with Friends and Family: Once a week   Attends Religious Services: More than 4 times per year   Active Member of [employer] or Organizations: Yes   Attends Music therapist: More than 4 times per year   Marital Status: Married    Tobacco Counseling Counseling given: Not Answered Tobacco comments: Never smoked    Clinical Intake:                 Diabetic? no         Activities of Daily Living In your present state of health, do you have any difficulty performing the following activities: 11/11/2020 11/27/2019  Hearing? N N  Vision? Y N  Difficulty concentrating or making decisions? N N  Walking or climbing stairs? N Y  Dressing or bathing? N N  Doing errands, shopping? N N  Preparing Food and eating ? N -  Using the Toilet? N -  In the past six months, have you accidently leaked urine? N -  Do you have problems with loss of bowel control? N -  Managing your Medications? N -  Managing your Finances? N -  Housekeeping or managing your Housekeeping? N -  Some recent data might be hidden    Patient Care Team: Noreene Larsson, NP as PCP - General (Nurse Practitioner) Derek Jack, MD as Medical Oncologist (Oncology) Donetta Potts, RN as Oncology Nurse Navigator (Oncology) Eloise Harman, DO as Consulting Physician (Internal Medicine)  Indicate any recent Medical Services you may have received from other than [facility] providers in the past year (date may be approximate).  Assessment:   This is a routine wellness examination for [facility].  Hearing/Vision screen No results found.  Dietary issues and exercise activities discussed:     Goals Addressed   None   Depression Screen PHQ 2/9 Scores 11/11/2020 10/29/2020 10/01/2020 07/15/2020 04/30/2020  02/27/2020 01/02/2020  PHQ - 2 Score 0 0 1 0 0 0 0  PHQ- 9 Score - - - - - - 0    Fall Risk Fall Risk  11/11/2020 11/11/2020 10/29/2020 10/01/2020 07/15/2020  Falls in the past year? 0 0 0 0 1  Number falls in past yr: 0 0 0 0 0  Injury with Fall? 0 0 0 0 0  Risk for fall due to : No Fall Risks No Fall Risks No Fall Risks No Fall Risks Impaired mobility  Follow up Falls evaluation completed Falls evaluation completed Falls evaluation completed Falls evaluation completed Falls evaluation completed    FALL RISK PREVENTION PERTAINING TO THE HOME:  Any stairs in or around the home? Yes  If so, are there any without handrails? No  Home free of loose throw rugs in walkways, pet beds, electrical cords, etc? Yes  Adequate lighting in your home to reduce risk of falls? Yes   ASSISTIVE DEVICES UTILIZED TO PREVENT FALLS:  Life alert? Yes  Use of a cane, walker or w/c? Yes  Grab bars in the bathroom? Yes  Shower chair or bench in shower? Yes  Elevated toilet seat or a handicapped toilet? Yes   TIMED UP AND GO:  Was the test performed? No .  Length of time to ambulate n/a     Cognitive Function:     6CIT Screen 11/11/2020  What Year? 0 points  What month? 0 points  What time? 0 points  Count back from 20 0 points  Months in reverse 0 points  Repeat phrase 0 points  Total Score 0    Immunizations Immunization History  Administered Date(s) Administered   Fluad Quad(high Dose 65+) 02/22/2020   Influenza, High Dose Seasonal PF 01/23/2019   Influenza,inj,quad, With Preservative 02/09/2018   Moderna Sars-Covid-2 Vaccination 06/13/2019, 07/10/2019, 06/09/2020   Pneumococcal Conjugate-13 12/14/2017   Pneumococcal Polysaccharide-23 07/15/2020   Tdap 07/15/2020    TDAP status: Up to date  Flu Vaccine status: Up to date  Pneumococcal vaccine status: Up to date  Covid-19 vaccine status: Completed vaccines  Qualifies for Shingles Vaccine? Yes   Zostavax completed No   Shingrix  Completed?: No.    Education has been provided regarding the importance of this vaccine. Patient has been advised to call insurance company to determine out of pocket expense if they have not yet received this vaccine. Advised may also receive vaccine at local pharmacy or Health Dept. Verbalized acceptance and understanding.  Screening Tests Health Maintenance  Topic Date Due   COVID-19 Vaccine (4 - Booster for Moderna series) 09/07/2020   Zoster Vaccines- Shingrix (1 of 2) 02/11/2021 (Originally 02/01/1970)   INFLUENZA VACCINE  12/21/2020   MAMMOGRAM  02/06/2022   COLONOSCOPY (Pts 45-41yrs Insurance coverage will need to be confirmed)  05/23/2027   TETANUS/TDAP  07/15/2030   DEXA SCAN  Completed   Hepatitis C Screening  Completed   PNA vac Low Risk Adult  Completed   HPV VACCINES  Aged Out    Health Maintenance  Health Maintenance Due  Topic Date Due   COVID-19 Vaccine (4 - Booster for Moderna series) 09/07/2020    Colorectal cancer screening: Type of screening: Colonoscopy. Completed 05/22/17. Repeat every  10 years  Mammogram status: Completed 02/07/20. Repeat every year  Bone Density status: Completed 02/07/20. Results reflect: Bone density results: NORMAL. Repeat every 5 years.  Lung Cancer Screening: (Low Dose CT Chest recommended if Age 21-80 years, 30 pack-year currently smoking OR have quit w/in 15years.) does not qualify.   Lung Cancer Screening Referral: n/a  Additional Screening:  Hepatitis C Screening: does not qualify; Completed  Vision Screening: Recommended annual ophthalmology exams for early detection of glaucoma and other disorders of the eye. Is the patient up to date with their annual eye exam?  Yes  Who is the provider or what is the name of the office in which the patient attends annual eye exams? Dr. Jorja Loa If pt is not established with a provider, would they like to be referred to a provider to establish care?  N/a .   Dental Screening: Recommended  annual dental exams for proper oral hygiene  Community Resource Referral / Chronic Care Management: CRR required this visit?  No   CCM required this visit?  No      Plan:     I have personally reviewed and noted the following in the patient's chart:   Medical and social history Use of alcohol, tobacco or illicit drugs  Current medications and supplements including opioid prescriptions.  Functional ability and status Nutritional status Physical activity Advanced directives List of other physicians Hospitalizations, surgeries, and ER visits in previous 12 months Vitals Screenings to include cognitive, depression, and falls Referrals and appointments  In addition, I have reviewed and discussed with patient certain preventive protocols, quality metrics, and best practice recommendations. A written personalized care plan for preventive services as well as general preventive health recommendations were provided to patient.     Laretta Bolster, [place]   08/03/9700   Nurse Notes: AWV conducted by nurse in office with patients consent. Patient and provider both in the office at the time of this visit. Visit took 25 minutes to complete.

## 2020-11-12 ENCOUNTER — Ambulatory Visit: Payer: Medicare Other | Admitting: Nurse Practitioner

## 2020-11-19 ENCOUNTER — Encounter (HOSPITAL_COMMUNITY): Payer: Self-pay | Admitting: Adult Health

## 2020-12-28 ENCOUNTER — Other Ambulatory Visit: Payer: Self-pay

## 2020-12-28 ENCOUNTER — Ambulatory Visit (INDEPENDENT_AMBULATORY_CARE_PROVIDER_SITE_OTHER): Payer: Medicare Other

## 2020-12-28 DIAGNOSIS — Z23 Encounter for immunization: Secondary | ICD-10-CM | POA: Diagnosis not present

## 2021-01-21 ENCOUNTER — Other Ambulatory Visit: Payer: Self-pay

## 2021-01-21 ENCOUNTER — Inpatient Hospital Stay (HOSPITAL_COMMUNITY): Payer: Medicare Other | Attending: Hematology

## 2021-01-21 DIAGNOSIS — I252 Old myocardial infarction: Secondary | ICD-10-CM | POA: Insufficient documentation

## 2021-01-21 DIAGNOSIS — Z7901 Long term (current) use of anticoagulants: Secondary | ICD-10-CM | POA: Insufficient documentation

## 2021-01-21 DIAGNOSIS — I10 Essential (primary) hypertension: Secondary | ICD-10-CM | POA: Diagnosis not present

## 2021-01-21 DIAGNOSIS — E785 Hyperlipidemia, unspecified: Secondary | ICD-10-CM | POA: Insufficient documentation

## 2021-01-21 DIAGNOSIS — C9000 Multiple myeloma not having achieved remission: Secondary | ICD-10-CM

## 2021-01-21 DIAGNOSIS — Z7982 Long term (current) use of aspirin: Secondary | ICD-10-CM | POA: Diagnosis not present

## 2021-01-21 DIAGNOSIS — D649 Anemia, unspecified: Secondary | ICD-10-CM | POA: Insufficient documentation

## 2021-01-21 DIAGNOSIS — Z7951 Long term (current) use of inhaled steroids: Secondary | ICD-10-CM | POA: Insufficient documentation

## 2021-01-21 DIAGNOSIS — D472 Monoclonal gammopathy: Secondary | ICD-10-CM | POA: Insufficient documentation

## 2021-01-21 DIAGNOSIS — Z86711 Personal history of pulmonary embolism: Secondary | ICD-10-CM | POA: Insufficient documentation

## 2021-01-21 DIAGNOSIS — Z79899 Other long term (current) drug therapy: Secondary | ICD-10-CM | POA: Diagnosis not present

## 2021-01-21 LAB — CBC WITH DIFFERENTIAL/PLATELET
Abs Immature Granulocytes: 0.04 10*3/uL (ref 0.00–0.07)
Basophils Absolute: 0.1 10*3/uL (ref 0.0–0.1)
Basophils Relative: 1 %
Eosinophils Absolute: 0.3 10*3/uL (ref 0.0–0.5)
Eosinophils Relative: 5 %
HCT: 32.4 % — ABNORMAL LOW (ref 36.0–46.0)
Hemoglobin: 10.2 g/dL — ABNORMAL LOW (ref 12.0–15.0)
Immature Granulocytes: 1 %
Lymphocytes Relative: 25 %
Lymphs Abs: 1.7 10*3/uL (ref 0.7–4.0)
MCH: 31.5 pg (ref 26.0–34.0)
MCHC: 31.5 g/dL (ref 30.0–36.0)
MCV: 100 fL (ref 80.0–100.0)
Monocytes Absolute: 0.6 10*3/uL (ref 0.1–1.0)
Monocytes Relative: 9 %
Neutro Abs: 4.1 10*3/uL (ref 1.7–7.7)
Neutrophils Relative %: 59 %
Platelets: 122 10*3/uL — ABNORMAL LOW (ref 150–400)
RBC: 3.24 MIL/uL — ABNORMAL LOW (ref 3.87–5.11)
RDW: 15.6 % — ABNORMAL HIGH (ref 11.5–15.5)
WBC: 6.7 10*3/uL (ref 4.0–10.5)
nRBC: 0 % (ref 0.0–0.2)

## 2021-01-21 LAB — COMPREHENSIVE METABOLIC PANEL
ALT: 13 U/L (ref 0–44)
AST: 24 U/L (ref 15–41)
Albumin: 3.5 g/dL (ref 3.5–5.0)
Alkaline Phosphatase: 77 U/L (ref 38–126)
Anion gap: 5 (ref 5–15)
BUN: 14 mg/dL (ref 8–23)
CO2: 28 mmol/L (ref 22–32)
Calcium: 8.9 mg/dL (ref 8.9–10.3)
Chloride: 103 mmol/L (ref 98–111)
Creatinine, Ser: 0.84 mg/dL (ref 0.44–1.00)
GFR, Estimated: >60 mL/min (ref >60)
Glucose, Bld: 88 mg/dL (ref 70–99)
Potassium: 3.9 mmol/L (ref 3.5–5.1)
Sodium: 136 mmol/L (ref 135–145)
Total Bilirubin: 0.4 mg/dL (ref 0.3–1.2)
Total Protein: 9.9 g/dL — ABNORMAL HIGH (ref 6.5–8.1)

## 2021-01-21 LAB — IRON AND TIBC
Iron: 55 ug/dL (ref 28–170)
Saturation Ratios: 19 % (ref 10.4–31.8)
TIBC: 297 ug/dL (ref 250–450)
UIBC: 242 ug/dL

## 2021-01-21 LAB — FERRITIN: Ferritin: 216 ng/mL (ref 11–307)

## 2021-01-22 LAB — KAPPA/LAMBDA LIGHT CHAINS
Kappa free light chain: 47.2 mg/L — ABNORMAL HIGH (ref 3.3–19.4)
Kappa, lambda light chain ratio: 12.1 — ABNORMAL HIGH (ref 0.26–1.65)
Lambda free light chains: 3.9 mg/L — ABNORMAL LOW (ref 5.7–26.3)

## 2021-01-26 LAB — PROTEIN ELECTROPHORESIS, SERUM
A/G Ratio: 0.7 (ref 0.7–1.7)
Albumin ELP: 3.9 g/dL (ref 2.9–4.4)
Alpha-1-Globulin: 0.2 g/dL (ref 0.0–0.4)
Alpha-2-Globulin: 0.6 g/dL (ref 0.4–1.0)
Beta Globulin: 1.1 g/dL (ref 0.7–1.3)
Gamma Globulin: 4 g/dL — ABNORMAL HIGH (ref 0.4–1.8)
Globulin, Total: 5.9 g/dL — ABNORMAL HIGH (ref 2.2–3.9)
M-Spike, %: 2.8 g/dL — ABNORMAL HIGH
Total Protein ELP: 9.8 g/dL — ABNORMAL HIGH (ref 6.0–8.5)

## 2021-01-27 NOTE — Progress Notes (Signed)
Courtney Rogers, [place] [address]   CLINIC:  Medical Oncology/Hematology  PCP:  Noreene Larsson, NP [address]  [address] / [place] [address]  [phone]  REASON FOR VISIT:  Follow-up for smoldering myeloma  PRIOR THERAPY: none  CURRENT THERAPY: surveillance  INTERVAL HISTORY:  Courtney Rogers, a 70 y.o. female, returns for routine follow-up for her smoldering myeloma. Courtney Rogers was last seen on 10/21/2020.  Today she reports feeling okay. She had a right knee replacement since her last visit which is still causing her some pain. She denies any bleeding issues and black stools. She is taking 2 iron tablets daily, and she denies constipation. She is still taking coumadin. She denies any bone pain.   REVIEW OF SYSTEMS:  Review of Systems  Constitutional:  Positive for appetite change (25%) and fatigue (25%).  HENT:   Negative for nosebleeds.   Respiratory:  Negative for hemoptysis.   Gastrointestinal:  Positive for abdominal pain (6/10). Negative for blood in stool and constipation.  Genitourinary:  Positive for difficulty urinating and frequency. Negative for hematuria.   Musculoskeletal:  Positive for myalgias (6/10 R leg). Negative for arthralgias.  Neurological:  Positive for numbness (R leg).  Psychiatric/Behavioral:  Positive for sleep disturbance.   All other systems reviewed and are negative.  PAST MEDICAL/SURGICAL HISTORY:  Past Medical History:  Diagnosis Date   Acute myocardial infarction [facility]) 2009   CAD/no stent medically managed   Anxiety disorder    CAD (coronary artery disease)    Hyperlipidemia    Hypertension    IBS (irritable bowel syndrome)    Non-ulcer dyspepsia 04/30/2009   Qualifier: Diagnosis of  By: Oneida Alar MD, Sandi L    Overactive bladder    PE (pulmonary embolism) JUN 2014   Presence of permanent cardiac pacemaker    Sleep apnea    Past Surgical History:  Procedure Laterality Date    ABDOMINAL HYSTERECTOMY     BSO secondary to cyst     CARDIAC CATHETERIZATION     COLONOSCOPY  12/2009   Dr. West Carbo, propofol, normal. Next TCS 12/2019   COLONOSCOPY N/A 05/22/2017   Procedure: COLONOSCOPY;  Surgeon: Danie Binder, MD;  Location: [facility];  Service: Endoscopy;  Laterality: N/A;  10:30am   ESOPHAGOGASTRODUODENOSCOPY  05/11/09   schatzki ring/small hiatal hernia/path:gastritis   ESOPHAGOGASTRODUODENOSCOPY N/A 05/22/2017   Procedure: ESOPHAGOGASTRODUODENOSCOPY (EGD);  Surgeon: Danie Binder, MD;  Location: [facility];  Service: Endoscopy;  Laterality: N/A;   ESOPHAGOGASTRODUODENOSCOPY (EGD) WITH ESOPHAGEAL DILATION N/A 04/01/2013   [id number] at the gastroesophageal juction/multiple small polyps/mild gastritis   GIVENS CAPSULE STUDY N/A 06/07/2017   Procedure: GIVENS CAPSULE STUDY;  Surgeon: Danie Binder, MD;  Location: [facility];  Service: Endoscopy;  Laterality: N/A;  7:30am   INSERT / REPLACE / REMOVE PACEMAKER     Last year per pt.(can't remember date)    SOCIAL HISTORY:  Social History   Socioeconomic History   Marital status: Married    Spouse name: Games developer   Number of children: 3   Years of education: 16   Highest education level: Not on file  Occupational History   Not on file  Tobacco Use   Smoking status: Never   Smokeless tobacco: Never   Tobacco comments:    Never smoked   Vaping Use   Vaping Use: Never used  Substance and Sexual Activity   Alcohol use: Yes  Alcohol/week: 0.0 standard drinks    Comment: occ wine   Drug use: No   Sexual activity: Not Currently  Other Topics Concern   Not on file  Social History Narrative      Lives with husband-51 years 57 in Aug 2021   Daughter is close by, 2 sons live further away   One IN [place],ONE IN [place], [place].        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   Enjoys: reading, young adult      Diet: eats all food groups    Caffeine: coffee 1, tea daily  soda-1 daily   Water: 1-2 cups      Wears seat belt    Does not use phone while driving   Oceanographer at home    Public house manager -locked up      Left handed   One story home   Drinks caffeine   Social Determinants of Health   Financial Resource Strain: Low Risk    Difficulty of Paying Living Expenses: Not hard at all  Food Insecurity: No Food Insecurity   Worried About Charity fundraiser in the Last Year: Never true   Arboriculturist in the Last Year: Never true  Transportation Needs: No Transportation Needs   Lack of Transportation (Medical): No   Lack of Transportation (Non-Medical): No  Physical Activity: Inactive   Days of Exercise per Week: 0 days   Minutes of Exercise per Session: 0 min  Stress: No Stress Concern Present   Feeling of Stress : Not at all  Social Connections: Moderately Integrated   Frequency of Communication with Friends and Family: Once a week   Frequency of Social Gatherings with Friends and Family: Once a week   Attends Religious Services: More than 4 times per year   Active Member of [employer] or Organizations: Yes   Attends Music therapist: More than 4 times per year   Marital Status: Married  Human resources officer Violence: Not At Risk   Fear of Current or Ex-Partner: No   Emotionally Abused: No   Physically Abused: No   Sexually Abused: No    FAMILY HISTORY:  Family History  Problem Relation Age of Onset   Colon polyps Neg Hx    Colon cancer Neg Hx     CURRENT MEDICATIONS:  Current Outpatient Medications  Medication Sig Dispense Refill   acetaminophen (TYLENOL) 500 MG tablet Take 1,000 mg by mouth 2 (two) times daily as needed for moderate pain or headache.     albuterol (PROVENTIL HFA;VENTOLIN HFA) 108 (90 BASE) MCG/ACT inhaler Inhale 2 puffs into the lungs every 6 (six) hours as needed for shortness of breath.     amLODipine (NORVASC) 10 MG tablet Take 1 tablet (10 mg total) by mouth daily. 90 tablet 1    aspirin EC 81 MG tablet Take 81 mg by mouth at bedtime.     B Complex Vitamins (VITAMIN B COMPLEX) TABS      baclofen (LIORESAL) 10 MG tablet Take 10 mg by mouth 3 (three) times daily.     busPIRone (BUSPAR) 5 MG tablet Take 10 mg by mouth daily.     Calcium Carbonate-Vitamin D (CALCIUM 600+D PO) Take 1 tablet by mouth 2 (two) times daily.     chlorthalidone (HYGROTON) 25 MG tablet Take 25 mg by mouth daily.      cholecalciferol (VITAMIN D) 25 MCG (1000 UNIT) tablet Take 1,000 Units  by mouth daily.     clonazePAM (KLONOPIN) 0.5 MG tablet Take 0.5 mg by mouth 2 (two) times daily as needed.     Cyanocobalamin (B-12) 2500 MCG TABS Take 1 capsule by mouth daily.     diclofenac (CATAFLAM) 50 MG tablet Take 50 mg by mouth as needed.     famotidine (PEPCID) 20 MG tablet      FEROSUL 325 (65 Fe) MG tablet Take by mouth.     fexofenadine (ALLEGRA) 180 MG tablet Take 180 mg by mouth daily.     fluticasone (FLONASE) 50 MCG/ACT nasal spray SPRAY 2 SPRAYS INTO EACH NOSTRIL ONCE DAILY (Patient taking differently: Place 2 sprays into both nostrils daily.) 16 g 0   Fluticasone Furoate (ARNUITY ELLIPTA) 50 MCG/ACT AEPB      fluticasone-salmeterol (ADVAIR) 250-50 MCG/ACT AEPB Inhale 1 puff into the lungs 2 (two) times daily.     folic acid (FOLVITE) 1 MG tablet TAKE 1 TABLET BY MOUTH ONCE DAILY. 30 tablet 11   gabapentin (NEURONTIN) 600 MG tablet      ipratropium-albuterol (DUONEB) 0.5-2.5 (3) MG/3ML SOLN Inhale 3 mLs into the lungs every 4 (four) hours as needed. Supposed to be doing 4 times daily,  She has been doing as needed     lidocaine (XYLOCAINE) 2 % solution TAKE 2 TEASPOONFULS BEFORE MEALS AND AT BEDTIME AS NEEDED MAY REPEAT EVERY 4 HOURS. (MAX OF 8 DOSES PER DAY) 300 mL 0   Lidocaine HCl (XOLIDO) 2 % CREA Apply 1 application topically daily as needed (arthritisis).     losartan (COZAAR) 100 MG tablet Take 100 mg by mouth daily.     Magnesium Hydroxide (MILK OF MAGNESIA PO) Take by mouth as needed.      METAMUCIL MULTIHEALTH FIBER 58.12 % PACK Take by mouth.     metoprolol tartrate (LOPRESSOR) 100 MG tablet Take 100 mg by mouth 2 (two) times daily.     mirtazapine (REMERON) 15 MG tablet Take 15 mg by mouth at bedtime.     montelukast (SINGULAIR) 10 MG tablet Take 10 mg by mouth daily.      pantoprazole (PROTONIX) 40 MG tablet Take 40 mg by mouth daily.     potassium chloride (K-DUR,KLOR-CON) 10 MEQ tablet Take 10 mEq by mouth daily.     Probiotic Product (PROBIOTIC PO) Take 1 capsule by mouth daily. [employer] [employer] [facility].     sertraline (ZOLOFT) 100 MG tablet Take 200 mg by mouth daily.      Simethicone (GAS-X PO) Take 2 tablets by mouth daily as needed (gas).     simvastatin (ZOCOR) 40 MG tablet Take 40 mg by mouth at bedtime.     traMADol (ULTRAM) 50 MG tablet Take 50 mg by mouth 2 (two) times daily as needed.     vitamin B-12 (CYANOCOBALAMIN) 1000 MCG tablet Take 1,000 mcg by mouth daily.     vitamin C (ASCORBIC ACID) 500 MG tablet Take 500 mg by mouth daily.     warfarin (COUMADIN) 2.5 MG tablet Take 2.5 mg by mouth See admin instructions. Take '5mg'$  Mon-Fridays and  on Saturdays and Sundays  take 2.5 mg     warfarin (COUMADIN) 5 MG tablet Take 2.5-5 mg by mouth daily. Mon -Fri   5 mg and Sat -Sun  2.5 mg     nitroGLYCERIN (NITROSTAT) 0.4 MG SL tablet Place 0.4 mg under the tongue every 5 (five) minutes as needed for chest pain. (Patient not taking: Reported on 01/28/2021)  No current facility-administered medications for this visit.    ALLERGIES:  Allergies  Allergen Reactions   Amoxicillin-Pot Clavulanate Other (See Comments)   Biaxin [Clarithromycin] Other (See Comments)    Stomach problems    Erythromycin    Lisinopril Swelling    PHYSICAL EXAM:  Performance status (ECOG): 1 - Symptomatic but completely ambulatory  Vitals:   01/28/21 1456  BP: 139/75  Pulse: 84  Resp: 18  Temp: (!) 96.3 F (35.7 C)  SpO2: 97%   Wt Readings from Last 3 Encounters:  01/28/21 173 lb  1.6 oz (78.5 kg)  11/11/20 166 lb (75.3 kg)  11/11/20 166 lb (75.3 kg)   Physical Exam Vitals reviewed.  Constitutional:      Appearance: Normal appearance.  Cardiovascular:     Rate and Rhythm: Normal rate and regular rhythm.     Pulses: Normal pulses.     Heart sounds: Normal heart sounds.  Pulmonary:     Effort: Pulmonary effort is normal.     Breath sounds: Normal breath sounds.  Neurological:     General: No focal deficit present.     Mental Status: She is alert and oriented to person, place, and time.  Psychiatric:        Mood and Affect: Mood normal.        Behavior: Behavior normal.    LABORATORY DATA:  I have reviewed the labs as listed.  CBC Latest Ref Rng & Units 01/21/2021 10/21/2020 10/14/2020  WBC 4.0 - 10.5 K/uL 6.7 8.8 9.8  Hemoglobin 12.0 - 15.0 g/dL 10.2(L) 8.7(L) 8.8(L)  Hematocrit 36.0 - 46.0 % 32.4(L) 27.7(L) 28.5(L)  Platelets 150 - 400 K/uL 122(L) 180 161   CMP Latest Ref Rng & Units 01/21/2021 10/14/2020 08/12/2020  Glucose 70 - 99 mg/dL 88 91 92  BUN 8 - 23 mg/dL '14 23 18  '$ Creatinine 0.44 - 1.00 mg/dL 0.84 0.91 0.82  Sodium 135 - 145 mmol/L 136 138 140  Potassium 3.5 - 5.1 mmol/L 3.9 3.4(L) 3.8  Chloride 98 - 111 mmol/L 103 104 102  CO2 22 - 32 mmol/L '28 27 27  '$ Calcium 8.9 - 10.3 mg/dL 8.9 9.0 9.5  Total Protein 6.5 - 8.1 g/dL 9.9(H) 10.0(H) 9.9(H)  Total Bilirubin 0.3 - 1.2 mg/dL 0.4 0.5 0.5  Alkaline Phos 38 - 126 U/L 77 63 70  AST 15 - 41 U/L '24 22 26  '$ ALT 0 - 44 U/L '13 13 14      '$ Component Value Date/Time   RBC 3.24 (L) 01/21/2021 1018   MCV 100.0 01/21/2021 1018   MCV 95 07/15/2020 1113   MCH 31.5 01/21/2021 1018   MCHC 31.5 01/21/2021 1018   RDW 15.6 (H) 01/21/2021 1018   RDW 14.5 07/15/2020 1113   LYMPHSABS 1.7 01/21/2021 1018   LYMPHSABS 1.8 07/15/2020 1113   MONOABS 0.6 01/21/2021 1018   EOSABS 0.3 01/21/2021 1018   EOSABS 0.2 07/15/2020 1113   BASOSABS 0.1 01/21/2021 1018   BASOSABS 0.0 07/15/2020 1113    DIAGNOSTIC IMAGING:   I have independently reviewed the scans and discussed with the patient. No results found.   ASSESSMENT:  1.  IgA kappa smoldering myeloma: -BMBX on 07/09/2019 shows hypercellular marrow for age with trilineage hematopoiesis.  Increased number of atypical plasma cells present 36% of all cell lines.  Plasma cells are kappa light chain restricted. -Unfortunately her specimen has reached cytogenetics and FISH lab week later due to bad weather and no viable plasma cells. -PET scan on  07/09/2019 showed no findings of active myeloma. -24-hour urine shows total protein 59 mg.  Urine immunofixation shows IgA kappa monoclonal protein.  LDH is 184. -Based on [facility] criteria she has high risk with about 45-50% probability of progression to myeloma in the next 2 years. -CTAP on 01/07/2020 shows acute superior endplate burst fracture of L1.  Mild associated paravertebral soft tissue thickening.  No other bone abnormalities. -Bone density test on 02/07/2020 shows T score -0.5.   PLAN:  1.  IgA kappa smoldering myeloma: -She does not report any new onset bone pains.  Skeletal survey on 10/14/2020 with no suspicious bone lesions. - Reviewed labs from 01/21/2021 which showed M spike is stable at 2.8 g.  Creatinine and calcium were normal.  Hemoglobin is improving.  Free light chain ratio increased to 12.1 with kappa light chains 47.2. - Recommend follow-up in 4 months with repeat labs 1 week prior.   2.  Normocytic anemia: - We have reviewed her labs today which showed hemoglobin is 10.2, up from 8.7 at last visit.  Ferritin is 216 and percent saturation is 19. - No indication for parenteral iron therapy at this time.  She is continuing iron tablet twice daily.  Denies any bleeding per rectum or melena.   3.  Pulmonary embolism: - She had pulmonary embolism in 2015.  Continue Coumadin.  Orders placed this encounter:  No orders of the defined types were placed in this encounter.    Derek Jack,  MD [place] [phone]   I, Thana Ates, am acting as a scribe for Dr. Derek Jack.  I, Derek Jack MD, have reviewed the above documentation for accuracy and completeness, and I agree with the above.

## 2021-01-28 ENCOUNTER — Inpatient Hospital Stay (HOSPITAL_BASED_OUTPATIENT_CLINIC_OR_DEPARTMENT_OTHER): Payer: Medicare Other | Admitting: Hematology

## 2021-01-28 ENCOUNTER — Other Ambulatory Visit: Payer: Self-pay

## 2021-01-28 VITALS — BP 139/75 | HR 84 | Temp 96.3°F | Resp 18 | Wt 173.1 lb

## 2021-01-28 DIAGNOSIS — C9000 Multiple myeloma not having achieved remission: Secondary | ICD-10-CM | POA: Diagnosis not present

## 2021-01-28 DIAGNOSIS — D472 Monoclonal gammopathy: Secondary | ICD-10-CM

## 2021-01-28 NOTE — Patient Instructions (Addendum)
[facility] °Discharge Instructions ° °You were seen today by Dr. Katragadda. He went over your recent results. Dr. Katragadda will see you back in 4 months for labs and follow up. ° ° °Thank you for choosing [facility] at [facility] to provide your oncology and hematology care.  To afford each patient quality time with our provider, please arrive at least 15 minutes before your scheduled appointment time.  ° °If you have a lab appointment with the Cancer Center please come in thru the Main Entrance and check in at the main information desk ° °You need to re-schedule your appointment should you arrive 10 or more minutes late.  We strive to give you quality time with our providers, and arriving late affects you and other patients whose appointments are after yours.  Also, if you no show three or more times for appointments you may be dismissed from the clinic at the providers discretion.     °Again, thank you for choosing [facility].  Our hope is that these requests will decrease the amount of time that you wait before being seen by our physicians.       °_____________________________________________________________ ° °Should you have questions after your visit to [facility], please contact our office at [phone] between the hours of 8:00 a.m. and 4:30 p.m.  Voicemails left after 4:00 p.m. will not be returned until the following business day.  For prescription refill requests, have your pharmacy contact our office and allow 72 hours.   ° °Cancer Center Support Programs:  ° °> Cancer Support Group  °2nd Tuesday of the month 1pm-2pm, Journey Room  ° ° °

## 2021-02-02 ENCOUNTER — Ambulatory Visit (INDEPENDENT_AMBULATORY_CARE_PROVIDER_SITE_OTHER): Payer: Medicare Other | Admitting: Gastroenterology

## 2021-02-02 ENCOUNTER — Other Ambulatory Visit: Payer: Self-pay

## 2021-02-02 ENCOUNTER — Encounter: Payer: Self-pay | Admitting: Gastroenterology

## 2021-02-02 ENCOUNTER — Ambulatory Visit: Payer: Medicare Other | Admitting: Nurse Practitioner

## 2021-02-02 VITALS — BP 143/81 | HR 89 | Temp 97.5°F | Ht 63.0 in | Wt 175.8 lb

## 2021-02-02 DIAGNOSIS — R109 Unspecified abdominal pain: Secondary | ICD-10-CM | POA: Insufficient documentation

## 2021-02-02 DIAGNOSIS — K589 Irritable bowel syndrome without diarrhea: Secondary | ICD-10-CM | POA: Diagnosis not present

## 2021-02-02 DIAGNOSIS — R1010 Upper abdominal pain, unspecified: Secondary | ICD-10-CM

## 2021-02-02 DIAGNOSIS — R1084 Generalized abdominal pain: Secondary | ICD-10-CM | POA: Insufficient documentation

## 2021-02-02 DIAGNOSIS — K219 Gastro-esophageal reflux disease without esophagitis: Secondary | ICD-10-CM

## 2021-02-02 NOTE — Progress Notes (Signed)
Primary Care Physician: Noreene Larsson, NP  Primary Gastroenterologist:  Elon Alas. Abbey Chatters, DO   Chief Complaint  Patient presents with   Irritable Bowel Syndrome    F/u. Doing okay right now.   Gastroesophageal Reflux    F/u. Doing okay    HPI: Courtney Rogers is a 70 y.o. female here for follow up IBS, GERD. Last seen in 07/2020.   Since patient's last visit here she had a metastatic bone survey May 2022 for history of smoldering myeloma.  No suspicious osseous lesions noted. Recent labs with oncology January 21, 2021: Ferritin 260, iron 55, TIBC 297, hemoglobin 10.2 stable, platelets 122,000, LFTs normal.  She takes pantoprazole 40 mg daily.  Pepcid daily as needed.  She is on a probiotic daily.  Uses milk of magnesia as needed.  Stomach bothers with certain foods, peanuts and trail mix. If eats too much dairy, may have stomach issues. Heartburn well controlled. BM regular. Stools are fairly dark. Complains epigastric pain, more with movement. Not meal related or related to bowels. Can be pretty significant. Also with back pain. Severe back pain at times. Getting cement in her back soon. Wearing back support brace since April/May, applied around upper abdomen. Still recovering from right knee replacement. Still using walker at times.   EGD December 2018: -Multiple gastric polyps. Resected and retrieved.  Biopsy: Benign fundic gland polyp. -NO OBVIOUS SOURCE FOR NORMOCYTIC ANEMIA IDENTIFIED. -Small bowel biopsy negative for celiac, gastric biopsy with mild gastritis, no H. pylori.  Colonoscopy December 2018: -The examined portion of the ileum was normal. - There was significant looping of the colon. - External HEMORRHOIDS and RECTAL BLEEDING DUE TO internal hemorrhoids. - MILD Diverticulosis in the recto-sigmoid colon and in the sigmoid colon.  Small bowel capsule endoscopy January 2019: -Frequent gastric erosions -Normal small bowel mucosa  Current Outpatient  Medications  Medication Sig Dispense Refill   acetaminophen (TYLENOL) 500 MG tablet Take 1,000 mg by mouth 2 (two) times daily as needed for moderate pain or headache.     albuterol (PROVENTIL HFA;VENTOLIN HFA) 108 (90 BASE) MCG/ACT inhaler Inhale 2 puffs into the lungs every 6 (six) hours as needed for shortness of breath.     amLODipine (NORVASC) 10 MG tablet Take 1 tablet (10 mg total) by mouth daily. 90 tablet 1   aspirin EC 81 MG tablet Take 81 mg by mouth at bedtime.     B Complex Vitamins (VITAMIN B COMPLEX) TABS      baclofen (LIORESAL) 10 MG tablet Take 10 mg by mouth 3 (three) times daily.     busPIRone (BUSPAR) 5 MG tablet Take 10 mg by mouth daily.     Calcium Carbonate-Vitamin D (CALCIUM 600+D PO) Take 1 tablet by mouth 2 (two) times daily.     chlorthalidone (HYGROTON) 25 MG tablet Take 25 mg by mouth daily.      cholecalciferol (VITAMIN D) 25 MCG (1000 UNIT) tablet Take 1,000 Units by mouth daily.     clonazePAM (KLONOPIN) 0.5 MG tablet Take 0.5 mg by mouth 2 (two) times daily as needed.     Cyanocobalamin (B-12) 2500 MCG TABS Take 1 capsule by mouth daily.     diclofenac (CATAFLAM) 50 MG tablet Take 50 mg by mouth as needed.     famotidine (PEPCID) 20 MG tablet      FEROSUL 325 (65 Fe) MG tablet Take by mouth.     fexofenadine (ALLEGRA) 180 MG tablet Take 180 mg  by mouth daily.     fluticasone (FLONASE) 50 MCG/ACT nasal spray SPRAY 2 SPRAYS INTO EACH NOSTRIL ONCE DAILY (Patient taking differently: Place 2 sprays into both nostrils daily.) 16 g 0   Fluticasone Furoate (ARNUITY ELLIPTA) 50 MCG/ACT AEPB      fluticasone-salmeterol (ADVAIR) 250-50 MCG/ACT AEPB Inhale 1 puff into the lungs 2 (two) times daily.     folic acid (FOLVITE) 1 MG tablet TAKE 1 TABLET BY MOUTH ONCE DAILY. 30 tablet 11   gabapentin (NEURONTIN) 600 MG tablet      ipratropium-albuterol (DUONEB) 0.5-2.5 (3) MG/3ML SOLN Inhale 3 mLs into the lungs every 4 (four) hours as needed. Supposed to be doing 4 times  daily,  She has been doing as needed     lidocaine (XYLOCAINE) 2 % solution TAKE 2 TEASPOONFULS BEFORE MEALS AND AT BEDTIME AS NEEDED MAY REPEAT EVERY 4 HOURS. (MAX OF 8 DOSES PER DAY) 300 mL 0   Lidocaine HCl (XOLIDO) 2 % CREA Apply 1 application topically daily as needed (arthritisis).     losartan (COZAAR) 100 MG tablet Take 100 mg by mouth daily.     Magnesium Hydroxide (MILK OF MAGNESIA PO) Take by mouth as needed.     metoprolol tartrate (LOPRESSOR) 100 MG tablet Take 100 mg by mouth 2 (two) times daily.     mirtazapine (REMERON) 15 MG tablet Take 15 mg by mouth at bedtime.     montelukast (SINGULAIR) 10 MG tablet Take 10 mg by mouth daily.      nitroGLYCERIN (NITROSTAT) 0.4 MG SL tablet Place 0.4 mg under the tongue every 5 (five) minutes as needed for chest pain.     pantoprazole (PROTONIX) 40 MG tablet Take 40 mg by mouth daily.     potassium chloride (K-DUR,KLOR-CON) 10 MEQ tablet Take 10 mEq by mouth daily.     Probiotic Product (PROBIOTIC PO) Take 1 capsule by mouth daily. [employer] [facility].     sertraline (ZOLOFT) 100 MG tablet Take 200 mg by mouth daily.      Simethicone (GAS-X PO) Take 2 tablets by mouth daily as needed (gas).     simvastatin (ZOCOR) 40 MG tablet Take 40 mg by mouth at bedtime.     traMADol (ULTRAM) 50 MG tablet Take 50 mg by mouth 2 (two) times daily as needed.     vitamin B-12 (CYANOCOBALAMIN) 1000 MCG tablet Take 1,000 mcg by mouth daily.     vitamin C (ASCORBIC ACID) 500 MG tablet Take 500 mg by mouth daily.     warfarin (COUMADIN) 2.5 MG tablet Take 2.5 mg by mouth See admin instructions. Take '5mg'$  Mon-Fridays and  on Saturdays and Sundays  take 2.5 mg     warfarin (COUMADIN) 5 MG tablet Take 2.5-5 mg by mouth daily. Mon -Fri   5 mg and Sat -Sun  2.5 mg     No current facility-administered medications for this visit.    Allergies as of 02/02/2021 - Review Complete 02/02/2021  Allergen Reaction Noted   Amoxicillin-pot clavulanate Other (See Comments)  07/29/2020   Biaxin [clarithromycin] Other (See Comments)    Erythromycin  04/02/2020   Lisinopril Swelling     ROS:  General: Negative for anorexia, weight loss, fever, chills, fatigue, weakness. ENT: Negative for hoarseness, difficulty swallowing , nasal congestion. CV: Negative for chest pain, angina, palpitations, dyspnea on exertion, peripheral edema.  Respiratory: Negative for dyspnea at rest, dyspnea on exertion, cough, sputum, wheezing.  GI: See history of present illness. GU:  Negative for dysuria, hematuria, urinary incontinence, urinary frequency, nocturnal urination.  Endo: Negative for unusual weight change.    Physical Examination:   BP (!) 143/81   Pulse 89   Temp (!) 97.5 F (36.4 C)   Ht '5\' 3"'$  (1.6 m)   Wt 175 lb 12.8 oz (79.7 kg)   BMI 31.14 kg/m   General: Well-nourished, well-developed in no acute distress.  Eyes: No icterus. Mouth: masked Lungs: Clear to auscultation bilaterally.  Heart: Regular rate and rhythm, no murmurs rubs or gallops.  Abdomen: Bowel sounds are normal, mild epigastric tenderness, nondistended, no hepatosplenomegaly or masses, no abdominal bruits or hernia , no rebound or guarding.   Extremities: No lower extremity edema. No clubbing or deformities. Neuro: Alert and oriented x 4   Skin: Warm and dry, no jaundice.   Psych: Alert and cooperative, normal mood and affect.  Labs:  Lab Results  Component Value Date   WBC 6.7 01/21/2021   HGB 10.2 (L) 01/21/2021   HCT 32.4 (L) 01/21/2021   MCV 100.0 01/21/2021   PLT 122 (L) 01/21/2021   Lab Results  Component Value Date   CREATININE 0.84 01/21/2021   BUN 14 01/21/2021   NA 136 01/21/2021   K 3.9 01/21/2021   CL 103 01/21/2021   CO2 28 01/21/2021   Lab Results  Component Value Date   ALT 13 01/21/2021   AST 24 01/21/2021   ALKPHOS 77 01/21/2021   BILITOT 0.4 01/21/2021   Lab Results  Component Value Date   IRON 55 01/21/2021   TIBC 297 01/21/2021   FERRITIN 216  01/21/2021   Lab Results  Component Value Date   VITAMINB12 680 10/21/2020   Lab Results  Component Value Date   FOLATE >100.0 10/21/2020     Imaging Studies: No results found.   Assessment:  GERD: overall doing well on pantoprazole '40mg'$  daily. Pepcid '20mg'$  at bedtime as needed.   IBS: doing well. Using MOM as needed.   Abdominal pain: mostly epigastric region. Tender in epigastric region therefore not all of her abdominal pain is contributed to her back issues. Cannot rule out musculoskeletal but given persistent symptoms, will plan on investigating further via CT.   Plan: Hold iron for 2 weeks to see if stools return to normal color. If not, she will call. Continue pantoprazole '40mg'$  daily. Reinforced antireflux measures.  CT A/P with contrast.  OV in six months.

## 2021-02-02 NOTE — Patient Instructions (Signed)
CT scan of abdomen to be scheduled. We will be in touch with results as available.  Continue pantoprazole once daily before breakfast.  Stop iron for 2 weeks to see if stools return to normal color. If not, please call. Return to the office in six months or call sooner if needed.

## 2021-02-09 ENCOUNTER — Telehealth: Payer: Self-pay

## 2021-02-09 ENCOUNTER — Other Ambulatory Visit (HOSPITAL_COMMUNITY): Payer: Self-pay | Admitting: Hematology

## 2021-02-09 NOTE — Telephone Encounter (Signed)
PA for CT abd/pelvis submitted via AIM website for [facility]. Appropriate use decision: Adheres. Medicare decision support# [id number].

## 2021-02-09 NOTE — Telephone Encounter (Signed)
PA for CT abd/pelvis w/contrast submitted via AIM website for [place]. Order ID: [id number], valid 02/09/21-03/10/21.

## 2021-02-22 ENCOUNTER — Ambulatory Visit (INDEPENDENT_AMBULATORY_CARE_PROVIDER_SITE_OTHER): Payer: Medicare Other | Admitting: Nurse Practitioner

## 2021-02-22 ENCOUNTER — Other Ambulatory Visit: Payer: Self-pay

## 2021-02-22 ENCOUNTER — Encounter: Payer: Self-pay | Admitting: Nurse Practitioner

## 2021-02-22 VITALS — BP 146/57 | HR 80 | Temp 98.7°F | Ht 64.0 in | Wt 176.1 lb

## 2021-02-22 DIAGNOSIS — M545 Low back pain, unspecified: Secondary | ICD-10-CM | POA: Diagnosis not present

## 2021-02-22 DIAGNOSIS — I10 Essential (primary) hypertension: Secondary | ICD-10-CM

## 2021-02-22 DIAGNOSIS — D472 Monoclonal gammopathy: Secondary | ICD-10-CM | POA: Insufficient documentation

## 2021-02-22 MED ORDER — MIRABEGRON ER 25 MG PO TB24: 25.0000 mg | ORAL_TABLET | Freq: Every day | ORAL | 2 refills | Status: DC

## 2021-02-22 NOTE — Progress Notes (Signed)
Established Patient Office Visit  Subjective:  Patient ID: Courtney Rogers, female    DOB: 20-Apr-1951  Age: 70 y.o. MRN: [id number]  CC:  Chief Complaint  Patient presents with   Follow-up    4 month follow up urinating a lot , injection in back with ortho, cat scan for stomach pain next week     HPI Courtney Rogers presents for lab follow-up for HTN and HLD.  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 Last CBC/labs were drawn on 01/21/21.  She is concerned with urinary urgency that has been ongoing. No dysuria or malodorous urine.  Past Medical History:  Diagnosis Date   Acute myocardial infarction [facility]) 2009   CAD/no stent medically managed   Anxiety disorder    CAD (coronary artery disease)    Hyperlipidemia    Hypertension    IBS (irritable bowel syndrome)    Non-ulcer dyspepsia 04/30/2009   Qualifier: Diagnosis of  By: Oneida Alar MD, Sandi L    Overactive bladder    PE (pulmonary embolism) JUN 2014   Presence of permanent cardiac pacemaker    Sleep apnea     Past Surgical History:  Procedure Laterality Date   ABDOMINAL HYSTERECTOMY     BSO secondary to cyst     CARDIAC CATHETERIZATION     COLONOSCOPY  12/2009   Dr. West Carbo, propofol, normal. Next TCS 12/2019   COLONOSCOPY N/A 05/22/2017   Procedure: COLONOSCOPY;  Surgeon: Danie Binder, MD;  [facility]: [facility];  Service: Endoscopy;  Laterality: N/A;  10:30am   ESOPHAGOGASTRODUODENOSCOPY  05/11/2009   schatzki ring/small hiatal hernia/path:gastritis   ESOPHAGOGASTRODUODENOSCOPY N/A 05/22/2017   Procedure: ESOPHAGOGASTRODUODENOSCOPY (EGD);  Surgeon: Danie Binder, MD;  Location: [facility];  Service: Endoscopy;  Laterality: N/A;   ESOPHAGOGASTRODUODENOSCOPY (EGD) WITH ESOPHAGEAL DILATION N/A 04/01/2013   [id number] at the gastroesophageal juction/multiple small polyps/mild gastritis   GIVENS CAPSULE STUDY N/A 06/07/2017   Procedure: GIVENS CAPSULE STUDY;  Surgeon: Danie Binder,  MD;  Location: [facility];  Service: Endoscopy;  Laterality: N/A;  7:30am   INSERT / REPLACE / REMOVE PACEMAKER     Last year per pt.(can't remember date)   REPLACEMENT TOTAL KNEE Right 08/2020    Family History  Problem Relation Age of Onset   Colon polyps Neg Hx    Colon cancer Neg Hx     Social History   Socioeconomic History   Marital status: Married    Spouse name: [name]   Number of children: 3   Years of education: 16   Highest education level: Not on file  Occupational History   Not on file  Tobacco Use   Smoking status: Never   Smokeless tobacco: Never   Tobacco comments:    Never smoked   Vaping Use   Vaping Use: Never used  Substance and Sexual Activity   Alcohol use: Yes    Alcohol/week: 0.0 standard drinks    Comment: occ wine   Drug use: No   Sexual activity: Not Currently  Other Topics Concern   Not on file  Social History Narrative      Lives with husband-51 years 68 in Aug 2021   Daughter is close by, 2 sons live further away   One IN [place],ONE IN [place], [place].        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   Enjoys: reading, young adult      Diet: eats all food groups  Caffeine: coffee 1, tea daily soda-1 daily   Water: 1-2 cups      Wears seat belt    Does not use phone while driving   Oceanographer at home    Public house manager -locked up      Left handed   One story home   Drinks caffeine   Social Determinants of Health   Financial Resource Strain: Low Risk    Difficulty of Paying Living Expenses: Not hard at all  Food Insecurity: No Food Insecurity   Worried About Charity fundraiser in the Last Year: Never true   Arboriculturist in the Last Year: Never true  Transportation Needs: No Transportation Needs   Lack of Transportation (Medical): No   Lack of Transportation (Non-Medical): No  Physical Activity: Inactive   Days of Exercise per Week: 0 days   Minutes of Exercise per Session: 0  min  Stress: No Stress Concern Present   Feeling of Stress : Not at all  Social Connections: Moderately Integrated   Frequency of Communication with Friends and Family: Once a week   Frequency of Social Gatherings with Friends and Family: Once a week   Attends Religious Services: More than 4 times per year   Active Member of [employer] or Organizations: Yes   Attends Music therapist: More than 4 times per year   Marital Status: Married  Human resources officer Violence: Not At Risk   Fear of Current or Ex-Partner: No   Emotionally Abused: No   Physically Abused: No   Sexually Abused: No    Outpatient Medications Prior to Visit  Medication Sig Dispense Refill   acetaminophen (TYLENOL) 500 MG tablet Take 1,000 mg by mouth 2 (two) times daily as needed for moderate pain or headache.     albuterol (PROVENTIL HFA;VENTOLIN HFA) 108 (90 BASE) MCG/ACT inhaler Inhale 2 puffs into the lungs every 6 (six) hours as needed for shortness of breath.     amLODipine (NORVASC) 10 MG tablet Take 1 tablet (10 mg total) by mouth daily. 90 tablet 1   aspirin EC 81 MG tablet Take 81 mg by mouth at bedtime.     B Complex Vitamins (VITAMIN B COMPLEX) TABS      baclofen (LIORESAL) 10 MG tablet Take 10 mg by mouth 3 (three) times daily.     busPIRone (BUSPAR) 5 MG tablet Take 10 mg by mouth daily.     Calcium Carbonate-Vitamin D (CALCIUM 600+D PO) Take 1 tablet by mouth 2 (two) times daily.     chlorthalidone (HYGROTON) 25 MG tablet Take 25 mg by mouth daily.      cholecalciferol (VITAMIN D) 25 MCG (1000 UNIT) tablet Take 1,000 Units by mouth daily.     clonazePAM (KLONOPIN) 0.5 MG tablet Take 0.5 mg by mouth 2 (two) times daily as needed.     Cyanocobalamin (B-12) 2500 MCG TABS Take 1 capsule by mouth daily.     diclofenac (CATAFLAM) 50 MG tablet Take 50 mg by mouth as needed.     famotidine (PEPCID) 20 MG tablet      fexofenadine (ALLEGRA) 180 MG tablet Take 180 mg by mouth daily.     fluticasone  (FLONASE) 50 MCG/ACT nasal spray SPRAY 2 SPRAYS INTO EACH NOSTRIL ONCE DAILY (Patient taking differently: Place 2 sprays into both nostrils daily.) 16 g 0   Fluticasone Furoate (ARNUITY ELLIPTA) 50 MCG/ACT AEPB      fluticasone-salmeterol (ADVAIR) 250-50 MCG/ACT AEPB Inhale  1 puff into the lungs 2 (two) times daily.     folic acid (FOLVITE) 1 MG tablet TAKE 1 TABLET BY MOUTH ONCE DAILY. 30 tablet 11   gabapentin (NEURONTIN) 600 MG tablet      ipratropium-albuterol (DUONEB) 0.5-2.5 (3) MG/3ML SOLN Inhale 3 mLs into the lungs every 4 (four) hours as needed. Supposed to be doing 4 times daily,  She has been doing as needed     lidocaine (XYLOCAINE) 2 % solution TAKE 2 TEASPOONFULS BEFORE MEALS AND AT BEDTIME AS NEEDED MAY REPEAT EVERY 4 HOURS. (MAX OF 8 DOSES PER DAY) 300 mL 0   Lidocaine HCl (XOLIDO) 2 % CREA Apply 1 application topically daily as needed (arthritisis).     losartan (COZAAR) 100 MG tablet Take 100 mg by mouth daily.     Magnesium Hydroxide (MILK OF MAGNESIA PO) Take by mouth as needed.     metoprolol tartrate (LOPRESSOR) 100 MG tablet Take 100 mg by mouth 2 (two) times daily.     mirtazapine (REMERON) 15 MG tablet Take 15 mg by mouth at bedtime.     montelukast (SINGULAIR) 10 MG tablet Take 10 mg by mouth daily.      nitroGLYCERIN (NITROSTAT) 0.4 MG SL tablet Place 0.4 mg under the tongue every 5 (five) minutes as needed for chest pain.     pantoprazole (PROTONIX) 40 MG tablet Take 40 mg by mouth daily.     potassium chloride (K-DUR,KLOR-CON) 10 MEQ tablet Take 10 mEq by mouth daily.     Probiotic Product (PROBIOTIC PO) Take 1 capsule by mouth daily. [facility].     sertraline (ZOLOFT) 100 MG tablet Take 200 mg by mouth daily.      Simethicone (GAS-X PO) Take 2 tablets by mouth daily as needed (gas).     simvastatin (ZOCOR) 40 MG tablet Take 40 mg by mouth at bedtime.     traMADol (ULTRAM) 50 MG tablet Take 50 mg by mouth 2 (two) times daily as needed.     vitamin B-12  (CYANOCOBALAMIN) 1000 MCG tablet Take 1,000 mcg by mouth daily.     vitamin C (ASCORBIC ACID) 500 MG tablet Take 500 mg by mouth daily.     warfarin (COUMADIN) 2.5 MG tablet Take 2.5 mg by mouth See admin instructions. Take 16m Mon-Fridays and  on Saturdays and Sundays  take 2.5 mg     warfarin (COUMADIN) 5 MG tablet Take 2.5-5 mg by mouth daily. Mon -Fri   5 mg and Sat -Sun  2.5 mg     FEROSUL 325 (65 Fe) MG tablet Take by mouth. (Patient not taking: Reported on 02/22/2021)     No facility-administered medications prior to visit.    Allergies  Allergen Reactions   Amoxicillin-Pot Clavulanate Other (See Comments)   Biaxin [Clarithromycin] Other (See Comments)    Stomach problems    Erythromycin    Lisinopril Swelling    ROS Review of Systems  Constitutional: Negative.   Respiratory: Negative.    Cardiovascular: Negative.   Genitourinary:  Positive for urgency. Negative for dysuria, flank pain, frequency and hematuria.  Psychiatric/Behavioral: Negative.       Objective:    Physical Exam Constitutional:      Appearance: Normal appearance.  Cardiovascular:     Rate and Rhythm: Normal rate and regular rhythm.     Pulses: Normal pulses.     Heart sounds: Normal heart sounds.  Pulmonary:     Effort: Pulmonary effort is normal.  Breath sounds: Normal breath sounds.  Musculoskeletal:     Comments: Uses walker for ambulation  Neurological:     Mental Status: She is alert.  Psychiatric:        Mood and Affect: Mood normal.        Behavior: Behavior normal.        Thought Content: Thought content normal.        Judgment: Judgment normal.    BP (!) 146/57 (BP Location: Right Arm, Patient Position: Sitting, Cuff Size: Large)   Pulse 80   Temp 98.7 F (37.1 C) (Oral)   Ht '5\' 4"'  (1.626 m)   Wt 176 lb 1.9 oz (79.9 kg)   SpO2 96%   BMI 30.23 kg/m  Wt Readings from Last 3 Encounters:  02/22/21 176 lb 1.9 oz (79.9 kg)  02/02/21 175 lb 12.8 oz (79.7 kg)  01/28/21 173 lb  1.6 oz (78.5 kg)     Health Maintenance Due  Topic Date Due   COVID-19 Vaccine (4 - Booster for Moderna series) 09/01/2020   Zoster Vaccines- Shingrix (2 of 2) 02/22/2021    There are no preventive care reminders to display for this patient.  Lab Results  Component Value Date   TSH 0.644 05/02/2018   Lab Results  Component Value Date   WBC 6.7 01/21/2021   HGB 10.2 (L) 01/21/2021   HCT 32.4 (L) 01/21/2021   MCV 100.0 01/21/2021   PLT 122 (L) 01/21/2021   Lab Results  Component Value Date   NA 136 01/21/2021   K 3.9 01/21/2021   CO2 28 01/21/2021   GLUCOSE 88 01/21/2021   BUN 14 01/21/2021   CREATININE 0.84 01/21/2021   BILITOT 0.4 01/21/2021   ALKPHOS 77 01/21/2021   AST 24 01/21/2021   ALT 13 01/21/2021   PROT 9.9 (H) 01/21/2021   ALBUMIN 3.5 01/21/2021   CALCIUM 8.9 01/21/2021   ANIONGAP 5 01/21/2021   Lab Results  Component Value Date   CHOL 151 07/15/2020   Lab Results  Component Value Date   HDL 46 07/15/2020   Lab Results  Component Value Date   LDLCALC 82 07/15/2020   Lab Results  Component Value Date   TRIG 131 07/15/2020   No results found for: CHOLHDL No results found for: HGBA1C    Assessment & Plan:   Problem List Items Addressed This Visit       Cardiovascular and Mediastinum   Essential hypertension - Primary    BP Readings from Last 3 Encounters:  02/22/21 (!) 146/57  02/02/21 (!) 143/81  01/28/21 139/75  -SBP slightly elevated, but DBP is low; no change to meds d/t risk of hypotension      Relevant Orders   CMP14+EGFR   Lipid Panel With LDL/HDL Ratio     Other   Back pain    -followed by specialty -had steroid injeciton and that helped some -no kyphoplasty d/t old fx; no new fx      Smoldering myeloma    -followed by Dr. Delton Coombes       No orders of the defined types were placed in this encounter.   Follow-up: Return in about 6 months (around 08/23/2021) for Lab follow-up (HTN).    Noreene Larsson, NP

## 2021-02-22 NOTE — Assessment & Plan Note (Signed)
BP Readings from Last 3 Encounters:  02/22/21 (!) 146/57  02/02/21 (!) 143/81  01/28/21 139/75   -SBP slightly elevated, but DBP is low; no change to meds d/t risk of hypotension

## 2021-02-22 NOTE — Assessment & Plan Note (Signed)
-  followed by specialty -had steroid injeciton and that helped some -no kyphoplasty d/t old fx; no new fx

## 2021-02-22 NOTE — Assessment & Plan Note (Signed)
-  followed by Dr. Delton Coombes

## 2021-02-22 NOTE — Patient Instructions (Signed)
Please have fasting labs drawn today or in the next 7 days.  For your next appointment, please have fasting labs drawn 2-3 days prior to your appointment so we can discuss the results during your office visit.

## 2021-02-22 NOTE — Addendum Note (Signed)
Addended by: Demetrius Revel on: 02/22/2021 10:25 AM   Modules accepted: Orders

## 2021-02-25 ENCOUNTER — Other Ambulatory Visit (HOSPITAL_COMMUNITY): Payer: Medicare Other

## 2021-02-26 ENCOUNTER — Other Ambulatory Visit: Payer: Self-pay | Admitting: Nurse Practitioner

## 2021-02-26 LAB — CBC WITH DIFFERENTIAL/PLATELET
Basophils Absolute: 0.1 10*3/uL (ref 0.0–0.2)
Basos: 1 %
EOS (ABSOLUTE): 0.2 10*3/uL (ref 0.0–0.4)
Eos: 2 %
Hematocrit: 31.1 % — ABNORMAL LOW (ref 34.0–46.6)
Hemoglobin: 10.1 g/dL — ABNORMAL LOW (ref 11.1–15.9)
Immature Grans (Abs): 0.1 10*3/uL (ref 0.0–0.1)
Immature Granulocytes: 1 %
Lymphocytes Absolute: 2.1 10*3/uL (ref 0.7–3.1)
Lymphs: 26 %
MCH: 30.2 pg (ref 26.6–33.0)
MCHC: 32.5 g/dL (ref 31.5–35.7)
MCV: 93 fL (ref 79–97)
Monocytes Absolute: 0.5 10*3/uL (ref 0.1–0.9)
Monocytes: 6 %
Neutrophils Absolute: 5.1 10*3/uL (ref 1.4–7.0)
Neutrophils: 64 %
Platelets: 154 10*3/uL (ref 150–450)
RBC: 3.34 x10E6/uL — ABNORMAL LOW (ref 3.77–5.28)
RDW: 16.1 % — ABNORMAL HIGH (ref 11.7–15.4)
WBC: 8 10*3/uL (ref 3.4–10.8)

## 2021-02-26 LAB — CMP14+EGFR
ALT: 6 IU/L (ref 0–32)
AST: 19 IU/L (ref 0–40)
Albumin/Globulin Ratio: 0.6 — ABNORMAL LOW (ref 1.2–2.2)
Albumin: 3.9 g/dL (ref 3.7–4.7)
Alkaline Phosphatase: 89 IU/L (ref 44–121)
BUN/Creatinine Ratio: 26 (ref 12–28)
BUN: 24 mg/dL (ref 8–27)
Bilirubin Total: 0.3 mg/dL (ref 0.0–1.2)
CO2: 24 mmol/L (ref 20–29)
Calcium: 9 mg/dL (ref 8.7–10.3)
Chloride: 102 mmol/L (ref 96–106)
Creatinine, Ser: 0.94 mg/dL (ref 0.57–1.00)
Globulin, Total: 6.4 g/dL — ABNORMAL HIGH (ref 1.5–4.5)
Glucose: 82 mg/dL (ref 70–99)
Potassium: 4.1 mmol/L (ref 3.5–5.2)
Sodium: 144 mmol/L (ref 134–144)
Total Protein: 10.3 g/dL (ref 6.0–8.5)
eGFR: 64 mL/min/{1.73_m2} (ref >59)

## 2021-02-26 LAB — LIPID PANEL WITH LDL/HDL RATIO
Cholesterol, Total: 138 mg/dL (ref 100–199)
HDL: 55 mg/dL (ref >39)
LDL Chol Calc (NIH): 69 mg/dL (ref 0–99)
LDL/HDL Ratio: 1.3 ratio (ref 0.0–3.2)
Triglycerides: 71 mg/dL (ref 0–149)
VLDL Cholesterol Cal: 14 mg/dL (ref 5–40)

## 2021-02-26 NOTE — Progress Notes (Signed)
Labs are stable from last draw. Total protein was elevated, but it was close to the last value from a month ago. She is followed by oncology for this, so no new orders/prescriptions.

## 2021-03-01 ENCOUNTER — Other Ambulatory Visit: Payer: Self-pay

## 2021-03-01 ENCOUNTER — Ambulatory Visit (HOSPITAL_COMMUNITY)
Admission: RE | Admit: 2021-03-01 | Discharge: 2021-03-01 | Disposition: A | Discharge status: Home / Self Care | Payer: Medicare Other | Source: Ambulatory Visit | Attending: Gastroenterology | Admitting: Gastroenterology

## 2021-03-01 DIAGNOSIS — R1010 Upper abdominal pain, unspecified: Secondary | ICD-10-CM | POA: Diagnosis not present

## 2021-03-01 MED ORDER — IOHEXOL 300 MG/ML  SOLN
100.0000 mL | Freq: Once | INTRAMUSCULAR | Status: AC | PRN
  Administered 2021-03-01: 100 mL via INTRAVENOUS

## 2021-03-11 ENCOUNTER — Ambulatory Visit: Payer: Medicare Other | Admitting: Nurse Practitioner

## 2021-03-18 ENCOUNTER — Other Ambulatory Visit: Payer: Self-pay

## 2021-03-18 ENCOUNTER — Ambulatory Visit (INDEPENDENT_AMBULATORY_CARE_PROVIDER_SITE_OTHER): Payer: Medicare Other | Admitting: Family Medicine

## 2021-03-18 ENCOUNTER — Ambulatory Visit: Payer: Medicare Other

## 2021-03-18 ENCOUNTER — Encounter: Payer: Self-pay | Admitting: Family Medicine

## 2021-03-18 VITALS — BP 150/82

## 2021-03-18 DIAGNOSIS — R059 Cough, unspecified: Secondary | ICD-10-CM

## 2021-03-18 DIAGNOSIS — J309 Allergic rhinitis, unspecified: Secondary | ICD-10-CM

## 2021-03-18 MED ORDER — FLUTICASONE PROPIONATE 50 MCG/ACT NA SUSP: 2.0000 | Freq: Every day | NASAL | 6 refills | Status: DC

## 2021-03-18 NOTE — Patient Instructions (Addendum)
F/U with Richardean Canal as before , call if you need to be seen sooner  Covid test today as already arranged  Flonase is prescribed for allergy symptoms, continue daily singulair  Continue to follow with specialty clinic for bruising and follow in the coumadin clinic regulalrly as you already do  Thanks for choosing [facility], we consider it a privelige to serve you.

## 2021-03-18 NOTE — Progress Notes (Signed)
Virtual Visit via Telephone Note  I connected with Courtney Rogers on 03/18/21 at  9:00 AM EDT by telephone and verified that I am speaking with the correct person using two identifiers.  Location: Patient: home Provider: office   I discussed the limitations, risks, security and privacy concerns of performing an evaluation and management service by telephone and the availability of in person appointments. I also discussed with the patient that there may be a patient responsible charge related to this service. The patient expressed understanding and agreed to proceed.   History of Present Illness: Increased bruising in past several months, approx 2, recent coumadin check 3 weeks ago is within range has Jan f/u with heme / onc Frontal pressure down to nostrils, no nasal drainage , cough or sore throat, no fever or chills, x 2 weeks Observations/Objective: BP (!) 150/82  Good communication with no confusion and intact memory. Alert and oriented x 3 No signs of respiratory distress during speech   Assessment and Plan: Allergic rhinitis Uncontrolled, start dailyflonase, continue singulair as before  Allergic sinusitis Increased and uncontrolled symptoms, add flonase daily, continue singulair   Follow Up Instructions:    I discussed the assessment and treatment plan with the patient. The patient was provided an opportunity to ask questions and all were answered. The patient agreed with the plan and demonstrated an understanding of the instructions.   The patient was advised to call back or seek an in-person evaluation if the symptoms worsen or if the condition fails to improve as anticipated.  I provided 7  minutes of non-face-to-face time during this encounter.   Tula Nakayama, MD

## 2021-03-19 ENCOUNTER — Telehealth: Payer: Self-pay | Admitting: Nurse Practitioner

## 2021-03-19 NOTE — Telephone Encounter (Signed)
Pt called in for covid results

## 2021-03-19 NOTE — Telephone Encounter (Signed)
Spoke with pt and let her know that her covid results were not back yet she states understanding

## 2021-03-20 LAB — SARS-COV-2, NAA 2 DAY TAT

## 2021-03-20 LAB — NOVEL CORONAVIRUS, NAA: SARS-CoV-2, NAA: NOT DETECTED

## 2021-03-22 ENCOUNTER — Encounter: Payer: Self-pay | Admitting: Family Medicine

## 2021-03-22 DIAGNOSIS — J309 Allergic rhinitis, unspecified: Secondary | ICD-10-CM | POA: Insufficient documentation

## 2021-03-22 NOTE — Assessment & Plan Note (Signed)
Uncontrolled, start dailyflonase, continue singulair as before

## 2021-03-22 NOTE — Assessment & Plan Note (Signed)
Increased and uncontrolled symptoms, add flonase daily, continue singulair

## 2021-03-26 ENCOUNTER — Emergency Department (HOSPITAL_COMMUNITY)
Admission: EM | Admit: 2021-03-26 | Discharge: 2021-03-26 | Disposition: A | Discharge status: Home / Self Care | Payer: Medicare Other | Attending: Emergency Medicine | Admitting: Emergency Medicine

## 2021-03-26 ENCOUNTER — Emergency Department (HOSPITAL_COMMUNITY): Payer: Medicare Other

## 2021-03-26 ENCOUNTER — Encounter (HOSPITAL_COMMUNITY): Payer: Self-pay | Admitting: *Deleted

## 2021-03-26 DIAGNOSIS — Z79899 Other long term (current) drug therapy: Secondary | ICD-10-CM | POA: Diagnosis not present

## 2021-03-26 DIAGNOSIS — Z7982 Long term (current) use of aspirin: Secondary | ICD-10-CM | POA: Insufficient documentation

## 2021-03-26 DIAGNOSIS — I2510 Atherosclerotic heart disease of native coronary artery without angina pectoris: Secondary | ICD-10-CM | POA: Diagnosis not present

## 2021-03-26 DIAGNOSIS — M25552 Pain in left hip: Secondary | ICD-10-CM | POA: Diagnosis not present

## 2021-03-26 DIAGNOSIS — Z950 Presence of cardiac pacemaker: Secondary | ICD-10-CM | POA: Diagnosis not present

## 2021-03-26 DIAGNOSIS — Z96651 Presence of right artificial knee joint: Secondary | ICD-10-CM | POA: Diagnosis not present

## 2021-03-26 DIAGNOSIS — I10 Essential (primary) hypertension: Secondary | ICD-10-CM | POA: Insufficient documentation

## 2021-03-26 DIAGNOSIS — Z7901 Long term (current) use of anticoagulants: Secondary | ICD-10-CM | POA: Insufficient documentation

## 2021-03-26 NOTE — Discharge Instructions (Addendum)
As discussed, today's evaluation has been generally reassuring.  For the next 3 days please use Tylenol, 500 mg, taken 3 times daily with food.  In addition, obtain and use medicated patches including the ingredients methyl salicylate and lidocaine.  Patches can be applied to the area that is most painful on your left hip.  Each patch can be left in place for about 12 hours before putting a new 1 in place.  Please be sure to follow-up with your orthopedist.  Return here for concerning changes in your condition.

## 2021-03-26 NOTE — ED Provider Notes (Signed)
[facility] EMERGENCY DEPARTMENT Provider Note   CSN: [id number] Arrival date & time: 03/26/21  1036     History Chief Complaint  Patient presents with   Hip Pain    ANGELIQUE CHEVALIER is a 70 y.o. female.  HPI Elderly female presents with hip pain.  She notes history of ongoing low back pain, generalized deconditioning.  Without obvious precipitant about 1 week ago patient developed left hip pain, lateral, occasional inferior radiation, sore, persistent, not improved with Tylenol.  No associated dysuria, incontinence, fever, abdominal pain or other complaints.    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   Abdominal pain 02/02/2021   Buttock pain 10/01/2020   Bilateral knee pain 07/15/2020   Immunization due 07/15/2020   Encounter for subsequent annual wellness visit (AWV) in Medicare patient 07/15/2020   Easy bruising 04/30/2020   Trapezius muscle spasm 02/27/2020   DDD (degenerative disc disease), cervical 01/02/2020   Hoarseness of voice 11/27/2019   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 West Carbo, propofol, normal. Next TCS 12/2019   COLONOSCOPY N/A 05/22/2017   Procedure: COLONOSCOPY;  Surgeon: Danie Binder, MD;  Location: [facility];  Service: Endoscopy;  Laterality: N/A;  10:30am   ESOPHAGOGASTRODUODENOSCOPY  05/11/2009   schatzki ring/small hiatal hernia/path:gastritis   ESOPHAGOGASTRODUODENOSCOPY N/A 05/22/2017   Procedure: ESOPHAGOGASTRODUODENOSCOPY (EGD);  Surgeon: Danie Binder, MD;  Location: [facility];  Service: Endoscopy;  Laterality: N/A;   ESOPHAGOGASTRODUODENOSCOPY (EGD) WITH ESOPHAGEAL DILATION N/A 04/01/2013   [id number] at the gastroesophageal juction/multiple small polyps/mild gastritis   GIVENS CAPSULE STUDY N/A 06/07/2017   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     OB History     Gravida  3   Para  3   Term  3   Preterm      AB      Living         SAB      IAB      Ectopic      Multiple      Live Births  Family History  Problem Relation Age of Onset   Colon polyps Neg Hx    Colon cancer Neg Hx     Social History   Tobacco Use   Smoking status: Never   Smokeless tobacco: Never   Tobacco comments:    Never smoked   Vaping Use   Vaping Use: Never used  Substance Use Topics   Alcohol use: Yes    Alcohol/week: 0.0 standard drinks    Comment: occ wine   Drug use: No    Home Medications Prior to Admission medications   Medication Sig Start Date End Date Taking? Authorizing Provider  acetaminophen (TYLENOL) 500 MG tablet Take 1,000 mg by mouth 2 (two) times daily as needed for moderate pain or headache.   Yes [provider]  albuterol (PROVENTIL HFA;VENTOLIN HFA)  108 (90 BASE) MCG/ACT inhaler Inhale 2 puffs into the lungs every 6 (six) hours as needed for shortness of breath.   Yes [provider]  amLODipine (NORVASC) 10 MG tablet Take 1 tablet (10 mg total) by mouth daily. 07/15/20  Yes Noreene Larsson, NP  aspirin EC 81 MG tablet Take 81 mg by mouth at bedtime.   Yes [provider]  B Complex Vitamins (VITAMIN B COMPLEX) TABS Take 1 tablet by mouth daily.   Yes [provider]  busPIRone (BUSPAR) 5 MG tablet Take 5 mg by mouth daily. 11/25/16  Yes [provider]  Calcium Carbonate-Vitamin D (CALCIUM 600+D PO) Take 1 tablet by mouth 2 (two) times daily.   Yes [provider]  chlorthalidone (HYGROTON) 25 MG tablet Take 25 mg by mouth daily.    Yes [provider]  cholecalciferol (VITAMIN D) 25 MCG (1000 UNIT) tablet Take 1,000 Units by mouth daily. 10/15/20  Yes [provider]  fluticasone (FLONASE) 50 MCG/ACT nasal spray Place 2 sprays into both nostrils daily. 03/18/21  Yes Fayrene Helper, MD  fluticasone-salmeterol (ADVAIR) 250-50 MCG/ACT AEPB Inhale 1 puff into the lungs 2 (two) times daily. 10/12/20  Yes [provider]  folic acid (FOLVITE) 1 MG tablet TAKE 1 TABLET BY MOUTH ONCE DAILY. Patient taking differently: Take 1 mg by mouth daily. 02/09/21  Yes Derek Jack, MD  gabapentin (NEURONTIN) 600 MG tablet Take 600 mg by mouth 2 (two) times daily. 05/07/19  Yes [provider]  lidocaine (XYLOCAINE) 2 % solution TAKE 2 TEASPOONFULS BEFORE MEALS AND AT BEDTIME AS NEEDED MAY REPEAT EVERY 4 HOURS. (MAX OF 8 DOSES PER DAY) Patient taking differently: Use as directed 30 mLs in the mouth or throat every 4 (four) hours as needed for mouth pain. TAKE 2 TEASPOONFULS BEFORE MEALS AND AT BEDTIME AS NEEDED MAY REPEAT EVERY 4 HOURS. (MAX OF 8 DOSES PER DAY) 09/29/20  Yes Annitta Needs, NP  Lidocaine HCl (XOLIDO) 2 % CREA Apply 1 application topically daily as needed  (arthritisis).   Yes [provider]  losartan (COZAAR) 100 MG tablet Take 100 mg by mouth daily. 01/16/21  Yes [provider]  metoprolol tartrate (LOPRESSOR) 100 MG tablet Take 100 mg by mouth 2 (two) times daily. 01/04/21  Yes [provider]  mirabegron ER (MYRBETRIQ) 25 MG TB24 tablet Take 1 tablet (25 mg total) by mouth daily. 02/22/21  Yes Noreene Larsson, NP  mirtazapine (REMERON) 15 MG tablet Take 15 mg by mouth at bedtime. 09/18/20  Yes [provider]  montelukast (SINGULAIR) 10 MG tablet Take 10 mg by mouth daily.    Yes  [provider]  pantoprazole (PROTONIX) 40 MG tablet Take 40 mg by mouth daily. 08/10/20  Yes [provider]  potassium chloride (K-DUR,KLOR-CON) 10 MEQ tablet Take 10 mEq by mouth daily.   Yes [provider]  Probiotic Product (PROBIOTIC PO) Take 1 capsule by mouth daily. [facility].   Yes [provider]  sertraline (ZOLOFT) 100 MG tablet Take 200 mg by mouth daily.    Yes [provider]  Simethicone (GAS-X PO) Take 2 tablets by mouth daily as needed (gas).   Yes [provider]  simvastatin (ZOCOR) 40 MG tablet Take 40 mg by mouth at bedtime.   Yes [provider]  vitamin B-12 (CYANOCOBALAMIN) 1000 MCG tablet Take 1,000 mcg by mouth daily. 10/12/20  Yes [provider]  vitamin C (ASCORBIC ACID) 500 MG tablet Take 500 mg by mouth daily.   Yes [provider]  warfarin (COUMADIN) 2.5 MG tablet Take 2.5 mg by mouth daily.   Yes [provider]  fluticasone (FLONASE) 50 MCG/ACT nasal spray SPRAY 2 SPRAYS INTO EACH NOSTRIL ONCE DAILY Patient not taking: No sig reported 01/19/18   Caren Macadam, MD  nitroGLYCERIN (NITROSTAT) 0.4 MG SL tablet Place 0.4 mg under the tongue every 5 (five) minutes as needed for chest pain.    [provider]    Allergies    Amoxicillin-pot clavulanate, Biaxin [clarithromycin], Erythromycin, and  Lisinopril  Review of Systems   Review of Systems  Constitutional:        Per HPI, otherwise negative  HENT:         Per HPI, otherwise negative  Respiratory:         Per HPI, otherwise negative  Cardiovascular:        Per HPI, otherwise negative  Gastrointestinal:  Negative for vomiting.  Endocrine:       Negative aside from HPI  Genitourinary:        Neg aside from HPI   Musculoskeletal:        Per HPI, otherwise negative  Skin: Negative.   Neurological:  Negative for syncope.   Physical Exam Updated Vital Signs BP (!) 145/71   Pulse 66   Temp 98.1 F (36.7 C)   Resp 20   SpO2 97%   Physical Exam Vitals and nursing note reviewed.  Constitutional:      General: She is not in acute distress.    Appearance: She is well-developed.  HENT:     Head: Normocephalic and atraumatic.  Eyes:     Conjunctiva/sclera: Conjunctivae normal.  Cardiovascular:     Rate and Rhythm: Normal rate and regular rhythm.  Pulmonary:     Effort: Pulmonary effort is normal. No respiratory distress.     Breath sounds: Normal breath sounds. No stridor.  Abdominal:     General: There is no distension.  Musculoskeletal:       Legs:  Skin:    General: Skin is warm and dry.  Neurological:     Mental Status: She is alert and oriented to person, place, and time.     Cranial Nerves: No cranial nerve deficit.    ED Results / Procedures / Treatments   Labs (all labs ordered are listed, but only abnormal results are displayed) Labs Reviewed - No data to display  EKG None  Radiology DG Hip Unilat With Pelvis 2-3 Views Left  Result Date: 03/26/2021 CLINICAL DATA:  70 year old female left hip pain. EXAM: DG HIP (WITH OR WITHOUT PELVIS)  2-3V LEFT COMPARISON:  None. FINDINGS: There is no evidence of hip fracture or dislocation. Minimal degenerative changes of the hips bilaterally. Moderate degenerative change of the pubic symphysis. IMPRESSION: 1. No acute fracture or malalignment. 2. One  moderate pubic symphyseal degenerative change. 3. Mild bilateral hip degenerative changes. Electronically Signed   By: Ruthann Cancer M.D.   On: 03/26/2021 12:00    Procedures Procedures   Medications Ordered in ED Medications - No data to display  ED Course  I have reviewed the triage vital signs and the nursing notes.  Pertinent labs & imaging results that were available during my care of the patient were reviewed by me and considered in my medical decision making (see chart for details).   12:34 PM Patient awake, alert, in no distress.  She has been ambulatory with the assistance of a walker.  We discussed all findings and I reviewed her x-ray.  No evidence for acute fracture, findings consistent with degenerative changes.  Patient has no risk factors for DVT, no physical exam findings consistent with this.  No findings, similarly for cellulitis.  With reassuring results patient will start appropriate analgesics, follow-up with orthopedist for consideration of ongoing monitoring, management, physical therapy.  Final Clinical Impression(s) / ED Diagnoses Final diagnoses:  Left hip pain     Carmin Muskrat, MD 03/26/21 1236

## 2021-03-26 NOTE — ED Triage Notes (Signed)
Left hip pain x 1 week

## 2021-03-30 ENCOUNTER — Encounter (HOSPITAL_COMMUNITY): Payer: Self-pay | Admitting: Adult Health

## 2021-03-30 ENCOUNTER — Ambulatory Visit: Payer: Medicare Other

## 2021-04-02 ENCOUNTER — Ambulatory Visit (INDEPENDENT_AMBULATORY_CARE_PROVIDER_SITE_OTHER): Payer: Medicare Other | Admitting: *Deleted

## 2021-04-02 ENCOUNTER — Other Ambulatory Visit: Payer: Self-pay

## 2021-04-02 DIAGNOSIS — Z23 Encounter for immunization: Secondary | ICD-10-CM | POA: Diagnosis not present

## 2021-04-07 ENCOUNTER — Emergency Department (HOSPITAL_COMMUNITY): Payer: Medicare Other

## 2021-04-07 ENCOUNTER — Emergency Department (HOSPITAL_COMMUNITY)
Admission: EM | Admit: 2021-04-07 | Discharge: 2021-04-08 | Disposition: A | Discharge status: Home / Self Care | Payer: Medicare Other | Attending: Emergency Medicine | Admitting: Emergency Medicine

## 2021-04-07 ENCOUNTER — Other Ambulatory Visit: Payer: Self-pay

## 2021-04-07 ENCOUNTER — Encounter (HOSPITAL_COMMUNITY): Payer: Self-pay

## 2021-04-07 DIAGNOSIS — Z7952 Long term (current) use of systemic steroids: Secondary | ICD-10-CM | POA: Insufficient documentation

## 2021-04-07 DIAGNOSIS — Z7982 Long term (current) use of aspirin: Secondary | ICD-10-CM | POA: Insufficient documentation

## 2021-04-07 DIAGNOSIS — G4710 Hypersomnia, unspecified: Secondary | ICD-10-CM

## 2021-04-07 DIAGNOSIS — Y9384 Activity, sleeping: Secondary | ICD-10-CM | POA: Diagnosis not present

## 2021-04-07 DIAGNOSIS — R4182 Altered mental status, unspecified: Secondary | ICD-10-CM | POA: Insufficient documentation

## 2021-04-07 DIAGNOSIS — I119 Hypertensive heart disease without heart failure: Secondary | ICD-10-CM | POA: Diagnosis not present

## 2021-04-07 DIAGNOSIS — I2510 Atherosclerotic heart disease of native coronary artery without angina pectoris: Secondary | ICD-10-CM | POA: Insufficient documentation

## 2021-04-07 DIAGNOSIS — Z96651 Presence of right artificial knee joint: Secondary | ICD-10-CM | POA: Diagnosis not present

## 2021-04-07 DIAGNOSIS — Z7902 Long term (current) use of antithrombotics/antiplatelets: Secondary | ICD-10-CM | POA: Insufficient documentation

## 2021-04-07 DIAGNOSIS — Z950 Presence of cardiac pacemaker: Secondary | ICD-10-CM | POA: Diagnosis not present

## 2021-04-07 DIAGNOSIS — Z79899 Other long term (current) drug therapy: Secondary | ICD-10-CM | POA: Insufficient documentation

## 2021-04-07 LAB — CBC WITH DIFFERENTIAL/PLATELET
Abs Immature Granulocytes: 0.14 10*3/uL — ABNORMAL HIGH (ref 0.00–0.07)
Basophils Absolute: 0 10*3/uL (ref 0.0–0.1)
Basophils Relative: 1 %
Eosinophils Absolute: 0.1 10*3/uL (ref 0.0–0.5)
Eosinophils Relative: 2 %
HCT: 30.2 % — ABNORMAL LOW (ref 36.0–46.0)
Hemoglobin: 10 g/dL — ABNORMAL LOW (ref 12.0–15.0)
Immature Granulocytes: 2 %
Lymphocytes Relative: 19 %
Lymphs Abs: 1.6 10*3/uL (ref 0.7–4.0)
MCH: 31.4 pg (ref 26.0–34.0)
MCHC: 33.1 g/dL (ref 30.0–36.0)
MCV: 95 fL (ref 80.0–100.0)
Monocytes Absolute: 0.5 10*3/uL (ref 0.1–1.0)
Monocytes Relative: 7 %
Neutro Abs: 5.8 10*3/uL (ref 1.7–7.7)
Neutrophils Relative %: 69 %
Platelets: 111 10*3/uL — ABNORMAL LOW (ref 150–400)
RBC: 3.18 MIL/uL — ABNORMAL LOW (ref 3.87–5.11)
RDW: 17.1 % — ABNORMAL HIGH (ref 11.5–15.5)
WBC: 8.2 10*3/uL (ref 4.0–10.5)
nRBC: 0 % (ref 0.0–0.2)

## 2021-04-07 LAB — COMPREHENSIVE METABOLIC PANEL
ALT: 16 U/L (ref 0–44)
AST: 24 U/L (ref 15–41)
Albumin: 3.4 g/dL — ABNORMAL LOW (ref 3.5–5.0)
Alkaline Phosphatase: 71 U/L (ref 38–126)
Anion gap: 11 (ref 5–15)
BUN: 18 mg/dL (ref 8–23)
CO2: 25 mmol/L (ref 22–32)
Calcium: 9.3 mg/dL (ref 8.9–10.3)
Chloride: 102 mmol/L (ref 98–111)
Creatinine, Ser: 0.73 mg/dL (ref 0.44–1.00)
GFR, Estimated: >60 mL/min (ref >60)
Glucose, Bld: 91 mg/dL (ref 70–99)
Potassium: 3.2 mmol/L — ABNORMAL LOW (ref 3.5–5.1)
Sodium: 138 mmol/L (ref 135–145)
Total Bilirubin: 0.6 mg/dL (ref 0.3–1.2)
Total Protein: 10.1 g/dL — ABNORMAL HIGH (ref 6.5–8.1)

## 2021-04-07 LAB — PROTIME-INR
INR: 1.5 — ABNORMAL HIGH (ref 0.8–1.2)
Prothrombin Time: 18.4 seconds — ABNORMAL HIGH (ref 11.4–15.2)

## 2021-04-07 LAB — ETHANOL: Alcohol, Ethyl (B): <10 mg/dL (ref <10)

## 2021-04-07 NOTE — ED Notes (Signed)
Patient transported to CT 

## 2021-04-07 NOTE — ED Notes (Signed)
Pt on purewick. Unable to obtain a urine sample at this time.

## 2021-04-07 NOTE — ED Triage Notes (Addendum)
Pt c/o being sleepy.  On and off again nose bleed since Monday. Not bleeding now. Says she can see a scab.  Alert oriented and ambulatory with walker. Family with pt.

## 2021-04-08 LAB — URINALYSIS, ROUTINE W REFLEX MICROSCOPIC
Bilirubin Urine: NEGATIVE
Glucose, UA: NEGATIVE mg/dL
Hgb urine dipstick: NEGATIVE
Ketones, ur: NEGATIVE mg/dL
Leukocytes,Ua: NEGATIVE
Nitrite: NEGATIVE
Protein, ur: 30 mg/dL — AB
Specific Gravity, Urine: 1.016 (ref 1.005–1.030)
pH: 7 (ref 5.0–8.0)

## 2021-04-08 LAB — BLOOD GAS, ARTERIAL
Acid-Base Excess: 3.7 mmol/L — ABNORMAL HIGH (ref 0.0–2.0)
Bicarbonate: 27.6 mmol/L (ref 20.0–28.0)
Drawn by: 35043
FIO2: 21
O2 Saturation: 93.4 %
Patient temperature: 36.7
pCO2 arterial: 39.5 mmHg (ref 32.0–48.0)
pH, Arterial: 7.455 — ABNORMAL HIGH (ref 7.350–7.450)
pO2, Arterial: 75 mmHg — ABNORMAL LOW (ref 83.0–108.0)

## 2021-04-08 LAB — RAPID URINE DRUG SCREEN, HOSP PERFORMED
Amphetamines: NOT DETECTED
Barbiturates: NOT DETECTED
Benzodiazepines: NOT DETECTED
Cocaine: NOT DETECTED
Opiates: NOT DETECTED
Tetrahydrocannabinol: NOT DETECTED

## 2021-04-08 LAB — AMMONIA: Ammonia: 50 umol/L — ABNORMAL HIGH (ref 9–35)

## 2021-04-08 NOTE — ED Notes (Signed)
Pt discharged with daughter. Given d/c instructions, verbalized understanding. No further questions at this time.

## 2021-04-08 NOTE — ED Provider Notes (Signed)
[facility] EMERGENCY DEPARTMENT Provider Note   CSN: [id number] Arrival date & time: 04/07/21  1939     History Chief Complaint  Patient presents with   .    Sleepy    Courtney Rogers is a 70 y.o. female.  HPI Patient presented with mental status change.  Has been more sleepy.  Worried that it could be from her nosebleed.  States that she has had bleeding on the right nostril on and off since Monday.  No new medicines.  No trauma.  No fever.  No dysuria.  Just has been more sleepy.  No abdominal pain.  No other bleeding.  Is on Coumadin.    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   Abdominal pain 02/02/2021   Buttock pain 10/01/2020   Bilateral knee pain 07/15/2020   Immunization due 07/15/2020   Encounter for subsequent annual wellness visit (AWV) in Medicare patient 07/15/2020   Easy bruising 04/30/2020   Trapezius muscle spasm 02/27/2020   DDD (degenerative disc disease), cervical 01/02/2020   Hoarseness of voice 11/27/2019   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 West Carbo, propofol, normal. Next TCS 12/2019   COLONOSCOPY N/A 05/22/2017   Procedure: COLONOSCOPY;  Surgeon: Danie Binder, MD;  Location: [facility];  Service: Endoscopy;  Laterality: N/A;  10:30am   ESOPHAGOGASTRODUODENOSCOPY  05/11/2009   schatzki ring/small hiatal hernia/path:gastritis   ESOPHAGOGASTRODUODENOSCOPY N/A 05/22/2017   Procedure: ESOPHAGOGASTRODUODENOSCOPY (EGD);  Surgeon: Danie Binder, MD;  Location: [facility];  Service: Endoscopy;  Laterality: N/A;   ESOPHAGOGASTRODUODENOSCOPY (EGD) WITH ESOPHAGEAL DILATION N/A 04/01/2013   [id number] at the gastroesophageal juction/multiple small polyps/mild gastritis   GIVENS CAPSULE STUDY N/A 06/07/2017   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     OB History     Gravida  3   Para  3   Term  3   Preterm      AB      Living         SAB      IAB      Ectopic      Multiple  Live Births              Family History  Problem Relation Age of Onset   Colon polyps Neg Hx    Colon cancer Neg Hx     Social History   Tobacco Use   Smoking status: Never   Smokeless tobacco: Never   Tobacco comments:    Never smoked   Vaping Use   Vaping Use: Never used  Substance Use Topics   Alcohol use: Yes    Alcohol/week: 0.0 standard drinks    Comment: occ wine   Drug use: No    Home Medications Prior to Admission medications   Medication Sig Start Date End Date Taking? Authorizing Provider  acetaminophen (TYLENOL) 500 MG tablet Take 1,000 mg by mouth 2 (two) times daily as needed for moderate pain or headache.    [provider]  albuterol (PROVENTIL HFA;VENTOLIN HFA) 108 (90 BASE) MCG/ACT  inhaler Inhale 2 puffs into the lungs every 6 (six) hours as needed for shortness of breath.    [provider]  amLODipine (NORVASC) 10 MG tablet Take 1 tablet (10 mg total) by mouth daily. 07/15/20   Noreene Larsson, NP  aspirin EC 81 MG tablet Take 81 mg by mouth at bedtime.    [provider]  B Complex Vitamins (VITAMIN B COMPLEX) TABS Take 1 tablet by mouth daily.    [provider]  busPIRone (BUSPAR) 5 MG tablet Take 5 mg by mouth daily. 11/25/16   [provider]  Calcium Carbonate-Vitamin D (CALCIUM 600+D PO) Take 1 tablet by mouth 2 (two) times daily.    [provider]  chlorthalidone (HYGROTON) 25 MG tablet Take 25 mg by mouth daily.     [provider]  cholecalciferol (VITAMIN D) 25 MCG (1000 UNIT) tablet Take 1,000 Units by mouth daily. 10/15/20   [provider]  fluticasone (FLONASE) 50 MCG/ACT nasal spray SPRAY 2 SPRAYS INTO EACH NOSTRIL ONCE DAILY Patient not taking: No sig reported 01/19/18   Caren Macadam, MD  fluticasone (FLONASE) 50 MCG/ACT nasal spray Place 2 sprays into both nostrils daily. 03/18/21   Fayrene Helper, MD  fluticasone-salmeterol (ADVAIR) 250-50 MCG/ACT AEPB Inhale 1 puff into the lungs 2 (two) times daily. 10/12/20   [provider]  folic acid (FOLVITE) 1 MG tablet TAKE 1 TABLET BY MOUTH ONCE DAILY. Patient taking differently: Take 1 mg by mouth daily. 02/09/21   Derek Jack, MD  gabapentin (NEURONTIN) 600 MG tablet Take 600 mg by mouth 2 (two) times daily. 05/07/19   [provider]  lidocaine (XYLOCAINE) 2 % solution TAKE 2 TEASPOONFULS BEFORE MEALS AND AT BEDTIME AS NEEDED MAY REPEAT EVERY 4 HOURS. (MAX OF 8 DOSES PER DAY) Patient taking differently: Use as directed 30 mLs in the mouth or throat every 4 (four) hours as needed for mouth pain. TAKE 2 TEASPOONFULS BEFORE MEALS AND AT BEDTIME AS NEEDED MAY REPEAT EVERY 4 HOURS. (MAX OF 8 DOSES PER DAY) 09/29/20   Annitta Needs, NP  Lidocaine HCl (XOLIDO) 2 % CREA Apply 1 application topically daily as needed (arthritisis).    [provider]  losartan (COZAAR) 100 MG tablet Take 100 mg by mouth daily. 01/16/21   [provider]  metoprolol tartrate (LOPRESSOR) 100 MG tablet Take 100 mg by mouth 2 (two) times daily. 01/04/21   [provider]  mirabegron ER (MYRBETRIQ) 25 MG TB24 tablet Take 1 tablet (25 mg total) by mouth daily.  02/22/21   Noreene Larsson, NP  mirtazapine (REMERON) 15 MG tablet Take 15 mg by mouth at bedtime. 09/18/20   [provider]  montelukast (SINGULAIR) 10 MG tablet Take 10 mg by mouth daily.     [provider]  nitroGLYCERIN (NITROSTAT) 0.4 MG SL tablet Place 0.4 mg under the tongue every 5 (five) minutes as needed for chest pain.    [provider]  pantoprazole (PROTONIX) 40 MG tablet Take 40 mg by mouth daily. 08/10/20   [provider]  potassium chloride (K-DUR,KLOR-CON) 10 MEQ tablet Take 10 mEq by mouth daily.    [provider]  Probiotic Product (PROBIOTIC PO) Take 1 capsule by mouth daily. [facility].    [provider]  sertraline (ZOLOFT) 100 MG tablet Take 200 mg by mouth daily.     [provider]  Simethicone (GAS-X PO) Take 2 tablets by mouth daily as needed (gas).    [provider]  simvastatin (ZOCOR) 40 MG tablet Take 40 mg by mouth at bedtime.    [provider]  vitamin B-12 (CYANOCOBALAMIN) 1000 MCG tablet Take 1,000 mcg by mouth daily. 10/12/20   [provider]  vitamin C (ASCORBIC ACID) 500 MG tablet Take 500 mg by mouth daily.    [provider]  warfarin (COUMADIN) 2.5 MG tablet Take 2.5 mg by mouth daily.    [provider]    Allergies    Amoxicillin-pot clavulanate, Biaxin [clarithromycin], Erythromycin, and Lisinopril  Review of Systems   Review of Systems  Constitutional:  Negative for appetite change.  HENT:  Positive  for nosebleeds.   Respiratory:  Negative for shortness of breath.   Cardiovascular:  Negative for chest pain.  Gastrointestinal:  Negative for abdominal pain.  Genitourinary:  Negative for flank pain.  Musculoskeletal:  Negative for back pain.  Neurological:  Negative for seizures.  Psychiatric/Behavioral:  Negative for confusion.    Physical Exam Updated Vital Signs BP 124/66   Pulse (!) 59   Temp 98 F (36.7 C)   Resp 14   Ht 5\' 5"  (1.651 m)   Wt 77.1 kg   SpO2 95%   BMI 28.29 kg/m   Physical Exam Vitals and nursing note reviewed.  Constitutional:      Appearance: Normal appearance.  HENT:     Head: Atraumatic.     Nose:     Comments: Area of previous bleeding in the right nare.  No active bleeding. Eyes:     Pupils: Pupils are equal, round, and reactive to light.  Cardiovascular:     Rate and Rhythm: Normal rate and regular rhythm.  Pulmonary:     Breath sounds: No wheezing or rhonchi.  Chest:     Chest wall: No tenderness.  Abdominal:     Tenderness: There is no abdominal tenderness.  Musculoskeletal:        General: No tenderness.  Skin:    General: Skin is warm.     Capillary Refill: Capillary refill takes less than 2 seconds.  Neurological:     Mental Status: She is alert and oriented to person, place, and time.     Comments: Awake and pleasant but does appear a little sleepy.  Will go to sleep without some stimulation.    ED Results / Procedures / Treatments   Labs (all labs ordered are listed, but only abnormal results are displayed) Labs Reviewed  COMPREHENSIVE METABOLIC PANEL - Abnormal; Notable for the following components:  Result Value   Potassium 3.2 (*)    Total Protein 10.1 (*)    Albumin 3.4 (*)    All other components within normal limits  PROTIME-INR - Abnormal; Notable for the following components:   Prothrombin Time 18.4 (*)    INR 1.5 (*)    All other components within normal limits  CBC WITH DIFFERENTIAL/PLATELET - Abnormal;  Notable for the following components:   RBC 3.18 (*)    Hemoglobin 10.0 (*)    HCT 30.2 (*)    RDW 17.1 (*)    Platelets 111 (*)    Abs Immature Granulocytes 0.14 (*)    All other components within normal limits  ETHANOL  URINALYSIS, ROUTINE W REFLEX MICROSCOPIC  RAPID URINE DRUG SCREEN, HOSP PERFORMED  BLOOD GAS, ARTERIAL  AMMONIA    EKG EKG Interpretation  Date/Time:  Wednesday April 07 2021 21:40:50 EST Ventricular Rate:  77 PR Interval:  200 QRS Duration: 199 QT Interval:  471 QTC Calculation: 534 R Axis:   -73 Text Interpretation: atrial sensed ventricular pacing Left bundle branch block Confirmed by Davonna Belling [phone]) on 04/07/2021 9:43:42 PM  Radiology CT Head Wo Contrast  Result Date: 04/07/2021 CLINICAL DATA:  Altered mental status with several nose bleeds. EXAM: CT HEAD WITHOUT CONTRAST TECHNIQUE: Contiguous axial images were obtained from the base of the skull through the vertex without intravenous contrast. COMPARISON:  January 03, 2020 FINDINGS: Brain: There is mild cerebral atrophy with widening of the extra-axial spaces and ventricular dilatation. There are areas of decreased attenuation within the white matter tracts of the supratentorial brain, consistent with microvascular disease changes. Vascular: No hyperdense vessel or unexpected calcification. Skull: Normal. Negative for fracture or focal lesion. Sinuses/Orbits: No acute finding. Other: None. IMPRESSION: 1. Generalized cerebral atrophy. 2. No acute intracranial abnormality. Electronically Signed   By: Virgina Norfolk M.D.   On: 04/07/2021 21:52   DG Chest Portable 1 View  Result Date: 04/07/2021 CLINICAL DATA:  Altered mental status EXAM: PORTABLE CHEST 1 VIEW COMPARISON:  02/25/2018 FINDINGS: Left pacer remains in place, unchanged. Cardiomegaly, vascular congestion. No confluent opacity, effusion or edema. IMPRESSION: Cardiomegaly, vascular congestion Electronically Signed   By: Rolm Baptise M.D.    On: 04/07/2021 22:07    Procedures Procedures   Medications Ordered in ED Medications - No data to display  ED Course  I have reviewed the triage vital signs and the nursing notes.  Pertinent labs & imaging results that were available during my care of the patient were reviewed by me and considered in my medical decision making (see chart for details).    MDM Rules/Calculators/A&P                           Patient with mental status change.  Sleepy.  No new medicines.  Although does have a lidocaine patch she has been doing.  Head CT done and reassuring.  Chest x-ray did not show pneumonia.  Urine still pending.  Lab work otherwise reassuring.  INR is actually a little low.  Sleeping comfortably at this time.  Family members are with her.  Carol patient over to Dr. Christy Gentles. Final Clinical Impression(s) / ED Diagnoses Final diagnoses:  None    Rx / DC Orders ED Discharge Orders     None        Davonna Belling, MD 04/08/21 [phone]

## 2021-04-08 NOTE — ED Provider Notes (Signed)
I assumed care at signout to follow-up on labs. Patient presented for excessive sleepiness. Extensive work-up performed without any acute findings. No signs of hypercapnia.  Patient is drowsy but easily arousable, no focal weakness and follows commands.  She was able to ambulate with her walker. Spoke to her daughter Bowman via phone.  She reports she just noticed increased sleepiness but no signs of stroke or any change in her mental status or speech. Reports this has happened previously with unknown cause. Patient at this time is safe for discharge.  Daughter will come pick her up   Ripley Fraise, MD 04/08/21 [phone]

## 2021-04-08 NOTE — ED Notes (Signed)
Pt able to ambulate with walker without difficulty.

## 2021-04-13 ENCOUNTER — Encounter: Payer: Self-pay | Admitting: Nurse Practitioner

## 2021-04-13 ENCOUNTER — Other Ambulatory Visit: Payer: Self-pay

## 2021-04-13 ENCOUNTER — Ambulatory Visit (INDEPENDENT_AMBULATORY_CARE_PROVIDER_SITE_OTHER): Payer: Medicare Other | Admitting: Nurse Practitioner

## 2021-04-13 VITALS — BP 138/70 | HR 80 | Temp 97.6°F | Ht 64.0 in | Wt 180.0 lb

## 2021-04-13 DIAGNOSIS — M25552 Pain in left hip: Secondary | ICD-10-CM | POA: Diagnosis not present

## 2021-04-13 DIAGNOSIS — R29898 Other symptoms and signs involving the musculoskeletal system: Secondary | ICD-10-CM | POA: Diagnosis not present

## 2021-04-13 DIAGNOSIS — R251 Tremor, unspecified: Secondary | ICD-10-CM | POA: Diagnosis not present

## 2021-04-13 DIAGNOSIS — I10 Essential (primary) hypertension: Secondary | ICD-10-CM

## 2021-04-13 NOTE — Progress Notes (Signed)
   Courtney Rogers     MRN: [id number]      DOB: 1950-06-15   HPI Courtney Rogers is here for follow up and re-evaluation of chronic medical conditions, medication management and review of any available recent lab and radiology data.   Pt was seen recently in the ED with c/o of excessive sleepiness. Work up for stroke was negative. PT stated that she still feels sleepy. She was recently prescribed primidone for tremors by neurology.  She has been having problems with hip pain,takes tylenol, lidocaine cream.    ROS Denies recent fever or chills. Denies sinus pressure, nasal congestion, ear pain or sore throat. Denies chest congestion, productive cough or wheezing. Denies chest pains, palpitations and leg swelling Denies abdominal pain, nausea, vomiting,diarrhea or constipation.   Denies dysuria, frequency, hesitancy or incontinence. Has  hip pain uses a walker. Denies headaches, seizures, numbness, or tingling, has tremors Denies depression, anxiety or insomnia. Denies skin break down or rash.   PE  BP (!) 152/80 (BP Location: Left Arm, Patient Position: Sitting, Cuff Size: Normal)   Pulse 80   Temp 97.6 F (36.4 C) (Temporal)   Ht 5\' 4"  (1.626 m)   Wt 180 lb (81.6 kg)   SpO2 98%   BMI 30.90 kg/m   Patient alert and oriented and in no cardiopulmonary distress.  HEENT: No facial asymmetry, EOMI,     Neck supple .  Chest: Clear to auscultation bilaterally.  CVS: S1, S2 no murmurs, no S3.Regular rate.  ABD: Soft non tender.   Ext: No edema  MS: left hip pain, uses a walker for ambulation.intact power.   Skin: Intact, no ulcerations or rash noted.  Psych: Good eye contact, normal affect. Memory intact not anxious or depressed appearing.  CNS: CN 2-12 intact, power normal, tremors noted.alert and oriented

## 2021-04-13 NOTE — Patient Instructions (Signed)
Please take only the medications on your medication list.  Take  your primidone 50 mg at bedtime , it make make you sleepy.  We will order PT for you to do some exercise with you at home.   Thanks for choosing [facility] for your health care needs. We appreciate the opportunity to serve you.

## 2021-04-13 NOTE — Assessment & Plan Note (Signed)
Continue tylenol for pain as needed. Will refer to otho if pain does not get better. PT ordered for therapy.

## 2021-04-13 NOTE — Assessment & Plan Note (Signed)
PT goes to [facility] neurology . Continue primidone daily. PT advised to take med at bedtime since she has been felling too sleepy recently.

## 2021-04-13 NOTE — Assessment & Plan Note (Signed)
BP Readings from Last 3 Encounters:  04/13/21 (!) 152/80  04/08/21 127/61  03/26/21 (!) 146/77  importance of low salt diet and exercise as tolerates discussed with pt, Reckeck BP was 138/70  Continue current med.

## 2021-04-16 ENCOUNTER — Emergency Department (HOSPITAL_COMMUNITY)
Admission: EM | Admit: 2021-04-16 | Discharge: 2021-04-16 | Disposition: A | Discharge status: Home / Self Care | Payer: Medicare Other | Attending: Emergency Medicine | Admitting: Emergency Medicine

## 2021-04-16 ENCOUNTER — Encounter (HOSPITAL_COMMUNITY): Payer: Self-pay

## 2021-04-16 ENCOUNTER — Other Ambulatory Visit: Payer: Self-pay

## 2021-04-16 DIAGNOSIS — Z7982 Long term (current) use of aspirin: Secondary | ICD-10-CM | POA: Insufficient documentation

## 2021-04-16 DIAGNOSIS — I2510 Atherosclerotic heart disease of native coronary artery without angina pectoris: Secondary | ICD-10-CM | POA: Diagnosis not present

## 2021-04-16 DIAGNOSIS — Z96651 Presence of right artificial knee joint: Secondary | ICD-10-CM | POA: Diagnosis not present

## 2021-04-16 DIAGNOSIS — R400 Somnolence: Secondary | ICD-10-CM | POA: Diagnosis not present

## 2021-04-16 DIAGNOSIS — E876 Hypokalemia: Secondary | ICD-10-CM | POA: Diagnosis not present

## 2021-04-16 DIAGNOSIS — I10 Essential (primary) hypertension: Secondary | ICD-10-CM | POA: Insufficient documentation

## 2021-04-16 DIAGNOSIS — Z79899 Other long term (current) drug therapy: Secondary | ICD-10-CM | POA: Insufficient documentation

## 2021-04-16 DIAGNOSIS — R251 Tremor, unspecified: Secondary | ICD-10-CM | POA: Insufficient documentation

## 2021-04-16 DIAGNOSIS — R531 Weakness: Secondary | ICD-10-CM | POA: Insufficient documentation

## 2021-04-16 LAB — COMPREHENSIVE METABOLIC PANEL
ALT: 15 U/L (ref 0–44)
AST: 25 U/L (ref 15–41)
Albumin: 3.4 g/dL — ABNORMAL LOW (ref 3.5–5.0)
Alkaline Phosphatase: 69 U/L (ref 38–126)
Anion gap: 13 (ref 5–15)
BUN: 20 mg/dL (ref 8–23)
CO2: 25 mmol/L (ref 22–32)
Calcium: 9.3 mg/dL (ref 8.9–10.3)
Chloride: 104 mmol/L (ref 98–111)
Creatinine, Ser: 1 mg/dL (ref 0.44–1.00)
GFR, Estimated: 60 mL/min — ABNORMAL LOW (ref >60)
Glucose, Bld: 95 mg/dL (ref 70–99)
Potassium: 3.4 mmol/L — ABNORMAL LOW (ref 3.5–5.1)
Sodium: 142 mmol/L (ref 135–145)
Total Bilirubin: 0.5 mg/dL (ref 0.3–1.2)
Total Protein: 9.9 g/dL — ABNORMAL HIGH (ref 6.5–8.1)

## 2021-04-16 LAB — CBC WITH DIFFERENTIAL/PLATELET
Abs Immature Granulocytes: 0.04 10*3/uL (ref 0.00–0.07)
Basophils Absolute: 0 10*3/uL (ref 0.0–0.1)
Basophils Relative: 1 %
Eosinophils Absolute: 0.2 10*3/uL (ref 0.0–0.5)
Eosinophils Relative: 4 %
HCT: 30.5 % — ABNORMAL LOW (ref 36.0–46.0)
Hemoglobin: 9.6 g/dL — ABNORMAL LOW (ref 12.0–15.0)
Immature Granulocytes: 1 %
Lymphocytes Relative: 30 %
Lymphs Abs: 1.8 10*3/uL (ref 0.7–4.0)
MCH: 31.1 pg (ref 26.0–34.0)
MCHC: 31.5 g/dL (ref 30.0–36.0)
MCV: 98.7 fL (ref 80.0–100.0)
Monocytes Absolute: 0.5 10*3/uL (ref 0.1–1.0)
Monocytes Relative: 8 %
Neutro Abs: 3.4 10*3/uL (ref 1.7–7.7)
Neutrophils Relative %: 56 %
Platelets: 105 10*3/uL — ABNORMAL LOW (ref 150–400)
RBC: 3.09 MIL/uL — ABNORMAL LOW (ref 3.87–5.11)
RDW: 17.1 % — ABNORMAL HIGH (ref 11.5–15.5)
WBC: 5.9 10*3/uL (ref 4.0–10.5)
nRBC: 0 % (ref 0.0–0.2)

## 2021-04-16 LAB — BLOOD GAS, VENOUS
Acid-Base Excess: 3.8 mmol/L — ABNORMAL HIGH (ref 0.0–2.0)
Bicarbonate: 26.2 mmol/L (ref 20.0–28.0)
Drawn by: 4252
FIO2: 21
O2 Saturation: 34.3 %
Patient temperature: 35.7
pCO2, Ven: 46.9 mmHg (ref 44.0–60.0)
pH, Ven: 7.395 (ref 7.250–7.430)
pO2, Ven: <31 mmHg — CL (ref 32.0–45.0)

## 2021-04-16 LAB — MAGNESIUM: Magnesium: 1.9 mg/dL (ref 1.7–2.4)

## 2021-04-16 LAB — AMMONIA: Ammonia: 33 umol/L (ref 9–35)

## 2021-04-16 LAB — PHENOBARBITAL LEVEL: Phenobarbital: <5 ug/mL — ABNORMAL LOW (ref 15.0–30.0)

## 2021-04-16 MED ORDER — SODIUM CHLORIDE 0.9 % IV SOLN: INTRAVENOUS | Status: DC

## 2021-04-16 MED ORDER — POTASSIUM CHLORIDE CRYS ER 20 MEQ PO TBCR
40.0000 meq | EXTENDED_RELEASE_TABLET | Freq: Once | ORAL | Status: AC
  Administered 2021-04-16: 40 meq via ORAL
  Filled 2021-04-16: qty 2

## 2021-04-16 NOTE — ED Triage Notes (Signed)
Pt presents to ED with complaints of generalized weakness for a couple of weeks. Pt states she is concerned because she can't hold anything.

## 2021-04-16 NOTE — Discharge Instructions (Addendum)
The testing today did not show any serious problems.  Your potassium level was a little bit low.  I suggest that you double up on your potassium by taking 10 mEq, twice a day for 1 week.  It would be helpful to follow-up with your neurologist for further evaluation of the tremor, and weakness which you are having.  Additionally, call your PCP for a follow-up appointment to be seen next week for checkup, and repeat potassium testing.  At this time there does not appear to be evidence for stroke, serious infections or other complications.

## 2021-04-16 NOTE — ED Provider Notes (Signed)
[facility] EMERGENCY DEPARTMENT Provider Note   CSN: [id number] Arrival date & time: 04/16/21  4970     History Chief Complaint  Patient presents with   Weakness    Courtney Rogers is a 70 y.o. female.  HPI She presents for evaluation of ongoing weakness and sleepiness.  She also states that when she picks things up she drops them.  She complains of a tremor.  About 2 weeks ago she was started on primidone for tremor, prescribed by her neurologist.  She was evaluated in the ED recently, for similar symptoms.  Also, she saw her PCP, on 04/13/2021.  At that time she was prescribed physical therapy for ongoing weakness.  She states that last week she had two episodes of diarrhea that resolved spontaneously.  She is here with her daughter.  She uses a walker to ambulate.  She came by private vehicle.  She lives with her husband and another family member.  She is taking her usual medications as prescribed.  There are no other known active modifying factors.    Past Medical History:  Diagnosis Date   Acute myocardial infarction [facility]) 2009   CAD/no stent medically managed   Anxiety disorder    CAD (coronary artery disease)    Hyperlipidemia    Hypertension    IBS (irritable bowel syndrome)    Non-ulcer dyspepsia 04/30/2009   Qualifier: Diagnosis of  By: Oneida Alar MD, Sandi L    Overactive bladder    PE (pulmonary embolism) JUN 2014   Presence of permanent cardiac pacemaker    Sleep apnea     Patient Active Problem List   Diagnosis Date Noted   Tremors of nervous system 04/13/2021   Left hip pain 04/13/2021   Allergic rhinitis 03/22/2021   Allergic sinusitis 03/22/2021   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   Abdominal pain 02/02/2021   Buttock pain 10/01/2020   Bilateral knee pain 07/15/2020   Immunization due 07/15/2020   Encounter for subsequent annual wellness visit (AWV) in Medicare patient 07/15/2020   Easy bruising 04/30/2020   Trapezius muscle spasm 02/27/2020   DDD  (degenerative disc disease), cervical 01/02/2020   Hoarseness of voice 11/27/2019   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 West Carbo, propofol, normal. Next TCS 12/2019   COLONOSCOPY N/A 05/22/2017   Procedure: COLONOSCOPY;  Surgeon: Danie Binder, MD;  Location: [facility] END[facility] SU[facility];  Service: Endoscopy;  Laterality: N/A;  10:30am   ESOPHAGOGASTRODUODENOSCOPY  05/11/2009   schatzki ring/small hiatal hernia/path:gastritis   ESOPHAGOGASTRODUODENOSCOPY N/A 05/22/2017   Procedure: ESOPHAGOGASTRODUODENOSCOPY (EGD);  Surgeon: Danie Binder, MD;  Location: [facility] END[facility];  Service: Endoscopy;  Laterality: N/A;   ESOPHAGOGASTRODUODENOSCOPY (EGD) WITH ESOPHAGEAL DILATION N/A 04/01/2013   YOV:[id number] at the gastroesophageal juction/multiple small polyps/mild gastritis   GIVENS CAPSULE STUDY N/A 06/07/2017   Procedure: GIVENS CAPSULE STUDY;  Surgeon: Danie Binder, MD;  Location: [facility];  Service: Endoscopy;  Laterality: N/A;  7:30am   INSERT / REPLACE / REMOVE PACEMAKER  Last year per pt.(can't remember date)   REPLACEMENT TOTAL KNEE Right 08/2020     OB History     Gravida  3   Para  3   Term  3   Preterm      AB      Living         SAB      IAB      Ectopic      Multiple      Live Births              Family History  Problem Relation Age of Onset   Colon polyps Neg Hx    Colon cancer Neg Hx     Social History   Tobacco Use   Smoking status: Never   Smokeless tobacco: Never   Tobacco comments:    Never smoked    Vaping Use   Vaping Use: Never used  Substance Use Topics   Alcohol use: Yes    Alcohol/week: 0.0 standard drinks    Comment: occ wine   Drug use: No    Home Medications Prior to Admission medications   Medication Sig Start Date End Date Taking? Authorizing Provider  acetaminophen (TYLENOL) 500 MG tablet Take 1,000 mg by mouth 2 (two) times daily as needed for moderate pain or headache.   Yes [provider]  albuterol (PROVENTIL HFA;VENTOLIN HFA) 108 (90 BASE) MCG/ACT inhaler Inhale 2 puffs into the lungs every 6 (six) hours as needed for shortness of breath.   Yes [provider]  amLODipine (NORVASC) 10 MG tablet Take 1 tablet (10 mg total) by mouth daily. 07/15/20  Yes Noreene Larsson, NP  aspirin EC 81 MG tablet Take 81 mg by mouth at bedtime.   Yes [provider]  B Complex Vitamins (VITAMIN B COMPLEX) TABS Take 1 tablet by mouth daily.   Yes [provider]  busPIRone (BUSPAR) 5 MG tablet Take 5 mg by mouth daily. 11/25/16  Yes [provider]  Calcium Carbonate-Vitamin D (CALCIUM 600+D PO) Take 1 tablet by mouth 2 (two) times daily.   Yes [provider]  chlorthalidone (HYGROTON) 25 MG tablet Take 25 mg by mouth daily.    Yes [provider]  cholecalciferol (VITAMIN D) 25 MCG (1000 UNIT) tablet Take 1,000 Units by mouth daily. 10/15/20  Yes [provider]  fluticasone (FLONASE) 50 MCG/ACT nasal spray Place 2 sprays into both nostrils daily. 03/18/21  Yes Fayrene Helper, MD  fluticasone-salmeterol (ADVAIR) 250-50 MCG/ACT AEPB Inhale 1 puff into the lungs 2 (two) times daily. 10/12/20  Yes [provider]  folic acid (FOLVITE) 1 MG tablet TAKE 1 TABLET BY MOUTH ONCE DAILY. Patient taking differently: Take 1 mg by mouth daily. 02/09/21  Yes Derek Jack, MD  gabapentin (NEURONTIN) 600 MG tablet Take 600 mg by mouth 2 (two) times daily. 05/07/19  Yes [provider]  lidocaine  (XYLOCAINE) 2 % solution TAKE 2 TEASPOONFULS BEFORE MEALS AND AT BEDTIME AS NEEDED MAY REPEAT EVERY 4 HOURS. (MAX OF 8 DOSES PER DAY) Patient taking differently: Use as directed 30 mLs in the mouth or throat every 4 (four) hours as needed for mouth pain. TAKE 2 TEASPOONFULS BEFORE MEALS AND AT BEDTIME AS NEEDED MAY REPEAT EVERY 4 HOURS. (MAX OF 8 DOSES PER DAY) 09/29/20  Yes Annitta Needs, NP  Lidocaine HCl (XOLIDO) 2 % CREA Apply 1 application topically daily as needed (arthritisis).   Yes [provider]  losartan (COZAAR) 100 MG tablet Take 100 mg by mouth daily. 01/16/21  Yes [provider]  metoprolol tartrate (LOPRESSOR) 100 MG tablet Take 100 mg by mouth 2 (two) times daily. 01/04/21  Yes [provider]  mirabegron ER (MYRBETRIQ) 25 MG TB24 tablet Take 1 tablet (25 mg total) by mouth daily. 02/22/21  Yes Noreene Larsson, NP  mirtazapine (REMERON) 15 MG tablet Take 15 mg by mouth at bedtime. 09/18/20  Yes [provider]  montelukast (SINGULAIR) 10 MG tablet Take 10 mg by mouth daily.    Yes [provider]  nitroGLYCERIN (NITROSTAT) 0.4 MG SL tablet Place 0.4 mg under the tongue every 5 (five) minutes as needed for chest pain.   Yes [provider]  pantoprazole (PROTONIX) 40 MG tablet Take 40 mg by mouth daily. 08/10/20  Yes [provider]  potassium chloride (K-DUR,KLOR-CON) 10 MEQ tablet Take 10 mEq by mouth daily.   Yes [provider]  primidone (MYSOLINE) 50 MG tablet Take 50 mg by mouth daily. 03/31/21  Yes [provider]  Probiotic Product (PROBIOTIC PO) Take 1 capsule by mouth daily. [facility].   Yes [provider]  sertraline (ZOLOFT) 100 MG tablet Take 200 mg by mouth daily.    Yes [provider]  Simethicone (GAS-X PO) Take 2 tablets by mouth daily as needed (gas).   Yes [provider]  simvastatin (ZOCOR) 40 MG tablet Take 40 mg by mouth at bedtime.   Yes [provider]  vitamin B-12 (CYANOCOBALAMIN) 1000 MCG tablet Take 1,000 mcg by mouth daily. 10/12/20  Yes [provider]  vitamin C (ASCORBIC ACID) 500 MG tablet Take 500 mg by mouth daily.   Yes [provider]  warfarin (COUMADIN) 2.5 MG tablet Take 2.5 mg by mouth daily.   Yes [provider]    Allergies    Amoxicillin-pot clavulanate, Biaxin [clarithromycin], Erythromycin, and Lisinopril  Review of Systems   Review of Systems  All other systems reviewed and are negative.  Physical Exam Updated Vital Signs BP 140/71   Pulse (!) 59   Temp 98.3 F (36.8 C) (Oral)   Resp 14   Ht 5\' 4"  (1.626 m)   Wt 81.6 kg   SpO2 98%   BMI 30.90 kg/m   Physical Exam Vitals and nursing note reviewed.  Constitutional:      General: She is not in acute distress.    Appearance: She is well-developed. She is not ill-appearing, toxic-appearing or diaphoretic.     Comments: Debilitated, elderly.  She speaks slowly but is understandable and conversant.  HENT:     Head: Normocephalic and atraumatic.     Right Ear: External ear normal.     Left Ear: External ear normal.  Eyes:     Conjunctiva/sclera: Conjunctivae normal.     Pupils: Pupils are equal, round, and reactive to light.  Neck:     Trachea: Phonation normal.  Cardiovascular:     Rate and Rhythm: Normal rate and regular rhythm.     Heart sounds: Normal heart sounds.  Pulmonary:     Effort: Pulmonary effort is normal.     Breath sounds: Normal breath sounds.  Abdominal:     General: There is no distension.     Palpations: Abdomen is soft.     Tenderness: There is no abdominal tenderness.  Musculoskeletal:        General: Normal range of motion.     Cervical back:  Normal range of motion and neck supple.  Skin:    General: Skin is warm and dry.  Neurological:     Mental Status: She is alert and oriented to person, place, and time.     Cranial Nerves: No cranial nerve deficit.     Sensory: No sensory  deficit.     Motor: No abnormal muscle tone.     Coordination: Coordination normal.     Comments: No dysarthria or aphasia or ataxia.  Normal strength arms and legs bilaterally.  Psychiatric:        Mood and Affect: Mood normal.        Behavior: Behavior normal.        Thought Content: Thought content normal.        Judgment: Judgment normal.    ED Results / Procedures / Treatments   Labs (all labs ordered are listed, but only abnormal results are displayed) Labs Reviewed  COMPREHENSIVE METABOLIC PANEL - Abnormal; Notable for the following components:      Result Value   Potassium 3.4 (*)    Total Protein 9.9 (*)    Albumin 3.4 (*)    GFR, Estimated 60 (*)    All other components within normal limits  CBC WITH DIFFERENTIAL/PLATELET - Abnormal; Notable for the following components:   RBC 3.09 (*)    Hemoglobin 9.6 (*)    HCT 30.5 (*)    RDW 17.1 (*)    Platelets 105 (*)    All other components within normal limits  BLOOD GAS, VENOUS - Abnormal; Notable for the following components:   pO2, Ven <31.0 (*)    Acid-Base Excess 3.8 (*)    All other components within normal limits  PHENOBARBITAL LEVEL - Abnormal; Notable for the following components:   Phenobarbital <5.0 (*)    All other components within normal limits  AMMONIA  MAGNESIUM    EKG EKG Interpretation  Date/Time:  Friday April 16 2021 09:24:13 EST Ventricular Rate:  65 PR Interval:  151 QRS Duration: 203 QT Interval:  473 QTC Calculation: 492 R Axis:   -85 Text Interpretation: AV dual-paced rhythm since last tracing no significant change Confirmed by Daleen Bo [phone]) on 04/16/2021 9:38:45 AM  Radiology No results found.  Procedures Procedures   Medications Ordered in ED Medications  0.9 %  sodium chloride infusion ( Intravenous New Bag/Given 04/16/21 1139)  potassium chloride SA (KLOR-CON) CR tablet 40 mEq (40 mEq Oral Given 04/16/21 1242)    ED Course  I have reviewed the triage vital  signs and the nursing notes.  Pertinent labs & imaging results that were available during my care of the patient were reviewed by me and considered in my medical decision making (see chart for details).    MDM Rules/Calculators/A&P                            Patient Vitals for the past 24 hrs:  BP Temp Temp src Pulse Resp SpO2 Height Weight  04/16/21 1230 140/71 -- -- (!) 59 14 98 % -- --  04/16/21 1200 131/66 -- -- (!) 59 13 95 % -- --  04/16/21 1130 129/72 -- -- 60 11 94 % -- --  04/16/21 1126 128/64 -- -- 62 16 96 % -- --  04/16/21 1030 -- -- -- 63 13 96 % -- --  04/16/21 1015 -- -- -- 63 14 97 % -- --  04/16/21 1000 132/80 -- --  68 19 99 % -- --  04/16/21 0945 -- -- -- 65 13 98 % -- --  04/16/21 0930 134/68 -- -- 64 14 97 % -- --  04/16/21 0916 138/78 98.3 F (36.8 C) Oral 70 16 97 % -- --  04/16/21 0915 138/78 -- -- 68 -- 98 % -- --  04/16/21 0914 -- -- -- -- -- -- 5\' 4"  (1.626 m) 81.6 kg    1:08 PM Reevaluation with update and discussion. After initial assessment and treatment, an updated evaluation reveals she appears somewhat brighter at this time.  I discussed findings with the patient and her daughter, all questions were answered. Daleen Bo   Medical Decision Making:  This patient is presenting for evaluation of weakness and sleepiness, which does require a range of treatment options, and is a complaint that involves a moderate risk of morbidity and mortality. The differential diagnoses include acute illness, metabolic disorder, chronic illness, nonspecific CNS abnormality. I decided to review old records, and in summary elderly female, lives with family members at home, presenting with ongoing symptoms despite multiple evaluations including by ED and PCP.  Also recently evaluated by neurology and started on phenobarbital, for tremors..  I did not require additional historical information from anyone.  Clinical Laboratory Tests Ordered, included CBC, Metabolic panel,  Urinalysis, and venous blood gas, ammonia level, phenobarbital level . Review indicates normal except phenobarbital level low, hemoglobin low, platelets low, potassium low, total protein high, albumin low.   Cardiac Monitor Tracing which shows AV paced rhythm    Critical Interventions-clinical evaluation, laboratory EKG, cardiac monitor, observation and reassessment.  Treatment with potassium.  Screening for hypomagnesemia.  After These Interventions, the Patient was reevaluated and was found stable for discharge.  Patient with ongoing neurologic disorder being treated by her neurologist.  She has ongoing persistent symptoms, previously comprehensively evaluated.  Screening evaluation today is reassuring.  No indication for hospitalization or change in ongoing treatment plan.  She has incidental hypokalemia, related to medical treatment, and not requiring overt intervention in the ED.  We will modify outpatient potassium treatment and recommend follow-up in 1 week for repeat potassium testing.  Follow-up recommendations include neurology for evaluation as an outpatient and PCP for ongoing management of chronic multiple medical conditions.  CRITICAL CARE-no Performed by: Daleen Bo  Nursing Notes Reviewed/ Care Coordinated Applicable Imaging Reviewed Interpretation of Laboratory Data incorporated into ED treatment  The patient appears reasonably screened and/or stabilized for discharge and I doubt any other medical condition or other [facility] requiring further screening, evaluation, or treatment in the ED at this time prior to discharge.  Plan: Home Medications-continue usual, double potassium dose for 1 week then recheck next week.; Home Treatments-rest, fluids, increase potassium in diet; return here if the recommended treatment, does not improve the symptoms; Recommended follow up-PCP 1 week for checkup.  Neurology for follow-up care.     Final Clinical Impression(s) / ED Diagnoses Final  diagnoses:  Weakness  Hypokalemia    Rx / DC Orders ED Discharge Orders     None        Daleen Bo, MD 04/17/21 [phone]

## 2021-04-16 NOTE — ED Notes (Addendum)
Attempted IV x2. Second nurse attempting now.

## 2021-05-06 ENCOUNTER — Encounter: Payer: Self-pay | Admitting: Internal Medicine

## 2021-05-06 ENCOUNTER — Other Ambulatory Visit: Payer: Self-pay

## 2021-05-06 ENCOUNTER — Ambulatory Visit (INDEPENDENT_AMBULATORY_CARE_PROVIDER_SITE_OTHER): Payer: Medicare Other | Admitting: Internal Medicine

## 2021-05-06 VITALS — BP 122/84 | HR 82 | Resp 18 | Ht 64.0 in | Wt 178.1 lb

## 2021-05-06 DIAGNOSIS — R209 Unspecified disturbances of skin sensation: Secondary | ICD-10-CM | POA: Insufficient documentation

## 2021-05-06 DIAGNOSIS — D500 Iron deficiency anemia secondary to blood loss (chronic): Secondary | ICD-10-CM | POA: Insufficient documentation

## 2021-05-06 DIAGNOSIS — R413 Other amnesia: Secondary | ICD-10-CM | POA: Insufficient documentation

## 2021-05-06 DIAGNOSIS — G5692 Unspecified mononeuropathy of left upper limb: Secondary | ICD-10-CM | POA: Insufficient documentation

## 2021-05-06 DIAGNOSIS — Z86711 Personal history of pulmonary embolism: Secondary | ICD-10-CM | POA: Insufficient documentation

## 2021-05-06 DIAGNOSIS — Z950 Presence of cardiac pacemaker: Secondary | ICD-10-CM | POA: Insufficient documentation

## 2021-05-06 DIAGNOSIS — Z90710 Acquired absence of both cervix and uterus: Secondary | ICD-10-CM | POA: Insufficient documentation

## 2021-05-06 DIAGNOSIS — F3340 Major depressive disorder, recurrent, in remission, unspecified: Secondary | ICD-10-CM | POA: Insufficient documentation

## 2021-05-06 DIAGNOSIS — R32 Unspecified urinary incontinence: Secondary | ICD-10-CM | POA: Diagnosis not present

## 2021-05-06 DIAGNOSIS — D689 Coagulation defect, unspecified: Secondary | ICD-10-CM | POA: Insufficient documentation

## 2021-05-06 DIAGNOSIS — N3946 Mixed incontinence: Secondary | ICD-10-CM | POA: Insufficient documentation

## 2021-05-06 DIAGNOSIS — G8929 Other chronic pain: Secondary | ICD-10-CM | POA: Insufficient documentation

## 2021-05-06 DIAGNOSIS — J45909 Unspecified asthma, uncomplicated: Secondary | ICD-10-CM | POA: Insufficient documentation

## 2021-05-06 DIAGNOSIS — J4520 Mild intermittent asthma, uncomplicated: Secondary | ICD-10-CM | POA: Insufficient documentation

## 2021-05-06 DIAGNOSIS — C9000 Multiple myeloma not having achieved remission: Secondary | ICD-10-CM | POA: Insufficient documentation

## 2021-05-06 DIAGNOSIS — R269 Unspecified abnormalities of gait and mobility: Secondary | ICD-10-CM | POA: Insufficient documentation

## 2021-05-06 DIAGNOSIS — F410 Panic disorder [episodic paroxysmal anxiety] without agoraphobia: Secondary | ICD-10-CM | POA: Insufficient documentation

## 2021-05-06 DIAGNOSIS — I2510 Atherosclerotic heart disease of native coronary artery without angina pectoris: Secondary | ICD-10-CM | POA: Insufficient documentation

## 2021-05-06 DIAGNOSIS — F329 Major depressive disorder, single episode, unspecified: Secondary | ICD-10-CM | POA: Insufficient documentation

## 2021-05-06 DIAGNOSIS — E782 Mixed hyperlipidemia: Secondary | ICD-10-CM | POA: Insufficient documentation

## 2021-05-06 DIAGNOSIS — F332 Major depressive disorder, recurrent severe without psychotic features: Secondary | ICD-10-CM | POA: Insufficient documentation

## 2021-05-06 DIAGNOSIS — F339 Major depressive disorder, recurrent, unspecified: Secondary | ICD-10-CM | POA: Insufficient documentation

## 2021-05-06 DIAGNOSIS — E669 Obesity, unspecified: Secondary | ICD-10-CM | POA: Insufficient documentation

## 2021-05-06 DIAGNOSIS — Z7901 Long term (current) use of anticoagulants: Secondary | ICD-10-CM | POA: Insufficient documentation

## 2021-05-06 DIAGNOSIS — D472 Monoclonal gammopathy: Secondary | ICD-10-CM | POA: Insufficient documentation

## 2021-05-06 LAB — POCT URINALYSIS DIP (CLINITEK)
Bilirubin, UA: NEGATIVE
Glucose, UA: NEGATIVE mg/dL
Ketones, POC UA: NEGATIVE mg/dL
Leukocytes, UA: NEGATIVE
Nitrite, UA: NEGATIVE
POC PROTEIN,UA: 30 — AB
Spec Grav, UA: 1.02 (ref 1.010–1.025)
Urobilinogen, UA: 0.2 E.U./dL
pH, UA: 5.5 (ref 5.0–8.0)

## 2021-05-06 NOTE — Patient Instructions (Signed)
Please start taking Chlorthalidone 1/2 tablet once daily for now.  Please perform scheduled voiding every 3 hours.  Avoid any caffeinated products in the afternoon and evening.  Please follow low salt diet.

## 2021-05-06 NOTE — Assessment & Plan Note (Addendum)
UA negative for LE and nitrite Appears to be mixed stress and urge incontinence Does not want to take any medication for it for now Scheduled voiding Advised to decrease Chlorthalidone to 1/2 tablet QD for now Kegel exercises advised, material provided

## 2021-05-06 NOTE — Progress Notes (Signed)
Acute Office Visit  Subjective:    Patient ID: Courtney Rogers, female    DOB: Feb 23, 1951, 70 y.o.   MRN: [id number]  Chief Complaint  Patient presents with   Urinary Frequency    Pt has been having urinary frequency for about 2 weeks no burning or pain just goes a lot     HPI Patient is in today for c/o urinary frequency for last 2 weeks. She has had chronic urge incontinence. Of note, she takes losartan and chlorthalidone for HTN.  She denies any dysuria or hematuria currently.  Denies any fever, chills, nausea or vomiting currently.  No recent new medicines except primidone.  Past Medical History:  Diagnosis Date   Acute myocardial infarction [facility]) 2009   CAD/no stent medically managed   Anxiety disorder    CAD (coronary artery disease)    Hyperlipidemia    Hypertension    IBS (irritable bowel syndrome)    Non-ulcer dyspepsia 04/30/2009   Qualifier: Diagnosis of  By: Oneida Alar MD, Sandi L    Overactive bladder    PE (pulmonary embolism) JUN 2014   Presence of permanent cardiac pacemaker    Sleep apnea     Past Surgical History:  Procedure Laterality Date   ABDOMINAL HYSTERECTOMY     BSO secondary to cyst     CARDIAC CATHETERIZATION     COLONOSCOPY  12/2009   Dr. West Carbo, propofol, normal. Next TCS 12/2019   COLONOSCOPY N/A 05/22/2017   Procedure: COLONOSCOPY;  Surgeon: Danie Binder, MD;  [facility];  Service: Endoscopy;  Laterality: N/A;  10:30am   ESOPHAGOGASTRODUODENOSCOPY  05/11/2009   schatzki ring/small hiatal hernia/path:gastritis   ESOPHAGOGASTRODUODENOSCOPY N/A 05/22/2017   Procedure: ESOPHAGOGASTRODUODENOSCOPY (EGD);  Surgeon: Danie Binder, MD;  [facility];  Service: Endoscopy;  Laterality: N/A;   ESOPHAGOGASTRODUODENOSCOPY (EGD) WITH ESOPHAGEAL DILATION N/A 04/01/2013   [id number] at the gastroesophageal juction/multiple small polyps/mild gastritis   GIVENS CAPSULE STUDY N/A 06/07/2017   Procedure: GIVENS CAPSULE STUDY;   Surgeon: Danie Binder, MD;  [facility];  Service: Endoscopy;  Laterality: N/A;  7:30am   INSERT / REPLACE / REMOVE PACEMAKER     Last year per pt.(can't remember date)   REPLACEMENT TOTAL KNEE Right 08/2020    Family History  Problem Relation Age of Onset   Colon polyps Neg Hx    Colon cancer Neg Hx     Social History   Socioeconomic History   Marital status: Married    Spouse name: [name]   Number of children: 3   Years of education: 16   Highest education level: Not on file  Occupational History   Not on file  Tobacco Use   Smoking status: Never   Smokeless tobacco: Never   Tobacco comments:    Never smoked   Vaping Use   Vaping Use: Never used  Substance and Sexual Activity   Alcohol use: Yes    Alcohol/week: 0.0 standard drinks    Comment: occ wine   Drug use: No   Sexual activity: Not Currently  Other Topics Concern   Not on file  Social History Narrative      Lives with husband-51 years 11 in Aug 2021   Daughter is close by, 2 sons live further away   One IN [place],ONE IN [place], [place].        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   Enjoys: reading, young adult  Diet: eats all food groups    Caffeine: coffee 1, tea daily soda-1 daily   Water: 1-2 cups      Wears seat belt    Does not use phone while driving   Smoke detectors at home    Public house manager -locked up      Left handed   One story home   Drinks caffeine   Social Determinants of Health   Financial Resource Strain: Low Risk    Difficulty of Paying Living Expenses: Not hard at all  Food Insecurity: No Food Insecurity   Worried About Charity fundraiser in the Last Year: Never true   Arboriculturist in the Last Year: Never true  Transportation Needs: No Transportation Needs   Lack of Transportation (Medical): No   Lack of Transportation (Non-Medical): No  Physical Activity: Inactive   Days of Exercise per Week: 0 days   Minutes  of Exercise per Session: 0 min  Stress: No Stress Concern Present   Feeling of Stress : Not at all  Social Connections: Moderately Integrated   Frequency of Communication with Friends and Family: Once a week   Frequency of Social Gatherings with Friends and Family: Once a week   Attends Religious Services: More than 4 times per year   Active Member of [employer] or Organizations: Yes   Attends Music therapist: More than 4 times per year   Marital Status: Married  Human resources officer Violence: Not At Risk   Fear of Current or Ex-Partner: No   Emotionally Abused: No   Physically Abused: No   Sexually Abused: No    Outpatient Medications Prior to Visit  Medication Sig Dispense Refill   acetaminophen (TYLENOL) 500 MG tablet Take 1,000 mg by mouth 2 (two) times daily as needed for moderate pain or headache.     albuterol (PROVENTIL HFA;VENTOLIN HFA) 108 (90 BASE) MCG/ACT inhaler Inhale 2 puffs into the lungs every 6 (six) hours as needed for shortness of breath.     amLODipine (NORVASC) 10 MG tablet Take 1 tablet (10 mg total) by mouth daily. 90 tablet 1   aspirin EC 81 MG tablet Take 81 mg by mouth at bedtime.     B Complex Vitamins (VITAMIN B COMPLEX) TABS Take 1 tablet by mouth daily.     busPIRone (BUSPAR) 5 MG tablet Take 5 mg by mouth daily.     Calcium Carbonate-Vitamin D (CALCIUM 600+D PO) Take 1 tablet by mouth 2 (two) times daily.     chlorthalidone (HYGROTON) 25 MG tablet Take 25 mg by mouth daily.      cholecalciferol (VITAMIN D) 25 MCG (1000 UNIT) tablet Take 1,000 Units by mouth daily.     fluticasone (FLONASE) 50 MCG/ACT nasal spray Place 2 sprays into both nostrils daily. 16 g 6   fluticasone-salmeterol (ADVAIR) 250-50 MCG/ACT AEPB Inhale 1 puff into the lungs 2 (two) times daily.     folic acid (FOLVITE) 1 MG tablet TAKE 1 TABLET BY MOUTH ONCE DAILY. (Patient taking differently: Take 1 mg by mouth daily.) 30 tablet 11   gabapentin (NEURONTIN) 600 MG tablet Take 600 mg  by mouth 2 (two) times daily.     lidocaine (XYLOCAINE) 2 % solution TAKE 2 TEASPOONFULS BEFORE MEALS AND AT BEDTIME AS NEEDED MAY REPEAT EVERY 4 HOURS. (MAX OF 8 DOSES PER DAY) (Patient taking differently: Use as directed 30 mLs in the mouth or throat every 4 (four) hours as needed for  mouth pain. TAKE 2 TEASPOONFULS BEFORE MEALS AND AT BEDTIME AS NEEDED MAY REPEAT EVERY 4 HOURS. (MAX OF 8 DOSES PER DAY)) 300 mL 0   Lidocaine HCl (XOLIDO) 2 % CREA Apply 1 application topically daily as needed (arthritisis).     losartan (COZAAR) 100 MG tablet Take 100 mg by mouth daily.     metoprolol tartrate (LOPRESSOR) 100 MG tablet Take 100 mg by mouth 2 (two) times daily.     mirabegron ER (MYRBETRIQ) 25 MG TB24 tablet Take 1 tablet (25 mg total) by mouth daily. 30 tablet 2   mirtazapine (REMERON) 15 MG tablet Take 15 mg by mouth at bedtime.     montelukast (SINGULAIR) 10 MG tablet Take 10 mg by mouth daily.      nitroGLYCERIN (NITROSTAT) 0.4 MG SL tablet Place 0.4 mg under the tongue every 5 (five) minutes as needed for chest pain.     pantoprazole (PROTONIX) 40 MG tablet Take 40 mg by mouth daily.     potassium chloride (K-DUR,KLOR-CON) 10 MEQ tablet Take 10 mEq by mouth daily.     primidone (MYSOLINE) 50 MG tablet Take 50 mg by mouth daily.     Probiotic Product (PROBIOTIC PO) Take 1 capsule by mouth daily. [facility].     sertraline (ZOLOFT) 100 MG tablet Take 200 mg by mouth daily.      Simethicone (GAS-X PO) Take 2 tablets by mouth daily as needed (gas).     simvastatin (ZOCOR) 40 MG tablet Take 40 mg by mouth at bedtime.     vitamin B-12 (CYANOCOBALAMIN) 1000 MCG tablet Take 1,000 mcg by mouth daily.     vitamin C (ASCORBIC ACID) 500 MG tablet Take 500 mg by mouth daily.     warfarin (COUMADIN) 2.5 MG tablet Take 2.5 mg by mouth daily.     No facility-administered medications prior to visit.    Allergies  Allergen Reactions   Amoxicillin-Pot Clavulanate Other (See Comments)   Biaxin  [Clarithromycin] Other (See Comments)    Stomach problems    Erythromycin    Lisinopril Swelling    Review of Systems  Constitutional:  Negative for chills and fever.  Respiratory:  Negative for cough and shortness of breath.   Cardiovascular:  Negative for chest pain and palpitations.  Genitourinary:  Positive for frequency. Negative for dysuria and hematuria.  Skin:  Negative for rash.  Neurological:  Positive for weakness. Negative for dizziness.      Objective:    Physical Exam Constitutional:      General: She is not in acute distress.    Appearance: She is not diaphoretic.  Cardiovascular:     Pulses: Normal pulses.     Heart sounds: Normal heart sounds. No murmur heard. Pulmonary:     Breath sounds: Normal breath sounds. No wheezing or rales.  Abdominal:     Palpations: Abdomen is soft.     Tenderness: There is no abdominal tenderness. There is no right CVA tenderness or left CVA tenderness.  Neurological:     General: No focal deficit present.     Mental Status: She is alert and oriented to person, place, and time.    BP 122/84 (BP Location: Left Arm, Patient Position: Sitting, Cuff Size: Normal)    Pulse 82    Resp 18    Ht 5\' 4"  (1.626 m)    Wt 178 lb 1.3 oz (80.8 kg)    SpO2 98%    BMI 30.57 kg/m  Wt Readings  from Last 3 Encounters:  05/06/21 178 lb 1.3 oz (80.8 kg)  04/16/21 180 lb (81.6 kg)  04/13/21 180 lb (81.6 kg)        Assessment & Plan:   Problem List Items Addressed This Visit       Other   Urinary incontinence - Primary    UA negative for LE and nitrite Appears to be mixed stress and urge incontinence Does not want to take any medication for it for now Scheduled voiding Advised to decrease Chlorthalidone to 1/2 tablet QD for now Kegel exercises advised, material provided      Relevant Orders   POCT URINALYSIS DIP (CLINITEK) (Completed)     No orders of the defined types were placed in this encounter.    Lindell Spar, MD

## 2021-05-24 ENCOUNTER — Other Ambulatory Visit: Payer: Self-pay | Admitting: Gastroenterology

## 2021-05-25 ENCOUNTER — Encounter (HOSPITAL_COMMUNITY): Payer: Self-pay | Admitting: Adult Health

## 2021-05-25 ENCOUNTER — Other Ambulatory Visit: Payer: Self-pay

## 2021-05-25 ENCOUNTER — Telehealth: Payer: Self-pay | Admitting: Nurse Practitioner

## 2021-05-26 ENCOUNTER — Encounter (HOSPITAL_COMMUNITY): Payer: Self-pay | Admitting: Adult Health

## 2021-05-26 ENCOUNTER — Inpatient Hospital Stay (HOSPITAL_COMMUNITY): Payer: Medicare Other | Attending: Hematology

## 2021-05-26 ENCOUNTER — Telehealth: Payer: Medicare Other | Admitting: Nurse Practitioner

## 2021-05-26 DIAGNOSIS — G479 Sleep disorder, unspecified: Secondary | ICD-10-CM | POA: Insufficient documentation

## 2021-05-26 DIAGNOSIS — Z881 Allergy status to other antibiotic agents status: Secondary | ICD-10-CM | POA: Insufficient documentation

## 2021-05-26 DIAGNOSIS — Z90722 Acquired absence of ovaries, bilateral: Secondary | ICD-10-CM | POA: Insufficient documentation

## 2021-05-26 DIAGNOSIS — Z888 Allergy status to other drugs, medicaments and biological substances status: Secondary | ICD-10-CM | POA: Insufficient documentation

## 2021-05-26 DIAGNOSIS — Z880 Allergy status to penicillin: Secondary | ICD-10-CM | POA: Insufficient documentation

## 2021-05-26 DIAGNOSIS — I252 Old myocardial infarction: Secondary | ICD-10-CM | POA: Insufficient documentation

## 2021-05-26 DIAGNOSIS — E785 Hyperlipidemia, unspecified: Secondary | ICD-10-CM | POA: Insufficient documentation

## 2021-05-26 DIAGNOSIS — Z7901 Long term (current) use of anticoagulants: Secondary | ICD-10-CM | POA: Insufficient documentation

## 2021-05-26 DIAGNOSIS — I129 Hypertensive chronic kidney disease with stage 1 through stage 4 chronic kidney disease, or unspecified chronic kidney disease: Secondary | ICD-10-CM | POA: Insufficient documentation

## 2021-05-26 DIAGNOSIS — R002 Palpitations: Secondary | ICD-10-CM | POA: Insufficient documentation

## 2021-05-26 DIAGNOSIS — C9000 Multiple myeloma not having achieved remission: Secondary | ICD-10-CM | POA: Insufficient documentation

## 2021-05-26 DIAGNOSIS — Z8719 Personal history of other diseases of the digestive system: Secondary | ICD-10-CM | POA: Insufficient documentation

## 2021-05-26 DIAGNOSIS — T465X6A Underdosing of other antihypertensive drugs, initial encounter: Secondary | ICD-10-CM | POA: Insufficient documentation

## 2021-05-26 DIAGNOSIS — Z86711 Personal history of pulmonary embolism: Secondary | ICD-10-CM | POA: Insufficient documentation

## 2021-05-26 DIAGNOSIS — K30 Functional dyspepsia: Secondary | ICD-10-CM | POA: Insufficient documentation

## 2021-05-26 DIAGNOSIS — R000 Tachycardia, unspecified: Secondary | ICD-10-CM | POA: Insufficient documentation

## 2021-05-26 DIAGNOSIS — I2510 Atherosclerotic heart disease of native coronary artery without angina pectoris: Secondary | ICD-10-CM | POA: Insufficient documentation

## 2021-05-26 DIAGNOSIS — F32A Depression, unspecified: Secondary | ICD-10-CM | POA: Insufficient documentation

## 2021-05-26 DIAGNOSIS — Z79899 Other long term (current) drug therapy: Secondary | ICD-10-CM | POA: Insufficient documentation

## 2021-05-26 DIAGNOSIS — K5900 Constipation, unspecified: Secondary | ICD-10-CM | POA: Insufficient documentation

## 2021-05-26 DIAGNOSIS — D649 Anemia, unspecified: Secondary | ICD-10-CM | POA: Insufficient documentation

## 2021-05-26 DIAGNOSIS — K589 Irritable bowel syndrome without diarrhea: Secondary | ICD-10-CM | POA: Insufficient documentation

## 2021-05-27 ENCOUNTER — Ambulatory Visit: Payer: Medicare Other

## 2021-05-27 ENCOUNTER — Encounter: Payer: Self-pay | Admitting: Internal Medicine

## 2021-05-27 ENCOUNTER — Ambulatory Visit (INDEPENDENT_AMBULATORY_CARE_PROVIDER_SITE_OTHER): Payer: BC Managed Care – PPO | Admitting: Internal Medicine

## 2021-05-27 ENCOUNTER — Other Ambulatory Visit: Payer: Self-pay

## 2021-05-27 ENCOUNTER — Encounter (HOSPITAL_COMMUNITY): Payer: Self-pay | Admitting: Adult Health

## 2021-05-27 DIAGNOSIS — Z20822 Contact with and (suspected) exposure to covid-19: Secondary | ICD-10-CM | POA: Diagnosis not present

## 2021-05-27 DIAGNOSIS — J069 Acute upper respiratory infection, unspecified: Secondary | ICD-10-CM

## 2021-05-27 MED ORDER — DOXYCYCLINE HYCLATE 100 MG PO TABS: 100.0000 mg | ORAL_TABLET | Freq: Two times a day (BID) | ORAL | 0 refills | Status: DC

## 2021-05-27 NOTE — Addendum Note (Signed)
Addended by: Quentin Angst on: 05/27/2021 03:24 PM   Modules accepted: Orders

## 2021-05-27 NOTE — Progress Notes (Signed)
Virtual Visit via Telephone Note   This visit type was conducted due to national recommendations for restrictions regarding the COVID-19 Pandemic (e.g. social distancing) in an effort to limit this patient's exposure and mitigate transmission in our community.  Due to her co-morbid illnesses, this patient is at least at moderate risk for complications without adequate follow up.  This format is felt to be most appropriate for this patient at this time.  The patient did not have access to video technology/had technical difficulties with video requiring transitioning to audio format only (telephone).  All issues noted in this document were discussed and addressed.  No physical exam could be performed with this format.  Evaluation Performed:  Follow-up visit  Date:  05/27/2021   ID:  Courtney, Rogers 1950/10/07, MRN [id number]  Patient Location: Home Provider Location: Office/Clinic  Participants: Patient Location of Patient: Home Location of Provider: Telehealth Consent was obtain for visit to be over via telehealth. I verified that I am speaking with the correct person using two identifiers.  PCP:  Renee Rival, FNP   Chief Complaint: Cough, fever and chills  History of Present Illness:    Courtney Rogers is a 71 y.o. female who has a televisit for complaint of fever, chills, myalgias and nasal congestion for about 5 days.  She denies any dyspnea or wheezing currently.  Denies any recent sick contacts.  The patient does have symptoms concerning for COVID-19 infection (fever, chills, cough, or new shortness of breath).   Past Medical, Surgical, Social History, Allergies, and Medications have been Reviewed.  Past Medical History:  Diagnosis Date   Acute myocardial infarction [facility]) 2009   CAD/no stent medically managed   Anxiety disorder    CAD (coronary artery disease)    Hyperlipidemia    Hypertension    IBS (irritable bowel syndrome)    Non-ulcer dyspepsia  04/30/2009   Qualifier: Diagnosis of  By: Oneida Alar MD, Sandi L    Overactive bladder    PE (pulmonary embolism) JUN 2014   Presence of permanent cardiac pacemaker    Sleep apnea    Past Surgical History:  Procedure Laterality Date   ABDOMINAL HYSTERECTOMY     BSO secondary to cyst     CARDIAC CATHETERIZATION     COLONOSCOPY  12/2009   Dr. West Carbo, propofol, normal. Next TCS 12/2019   COLONOSCOPY N/A 05/22/2017   Procedure: COLONOSCOPY;  Surgeon: Danie Binder, MD;  [facility];  Service: Endoscopy;  Laterality: N/A;  10:30am   ESOPHAGOGASTRODUODENOSCOPY  05/11/2009   schatzki ring/small hiatal hernia/path:gastritis   ESOPHAGOGASTRODUODENOSCOPY N/A 05/22/2017   Procedure: ESOPHAGOGASTRODUODENOSCOPY (EGD);  Surgeon: Danie Binder, MD;  [facility];  Service: Endoscopy;  Laterality: N/A;   ESOPHAGOGASTRODUODENOSCOPY (EGD) WITH ESOPHAGEAL DILATION N/A 04/01/2013   [id number] at the gastroesophageal juction/multiple small polyps/mild gastritis   GIVENS CAPSULE STUDY N/A 06/07/2017   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     Current Meds  Medication Sig   acetaminophen (TYLENOL) 500 MG tablet Take 1,000 mg by mouth 2 (two) times daily as needed for moderate pain or headache.   albuterol (PROVENTIL HFA;VENTOLIN HFA) 108 (90 BASE) MCG/ACT inhaler Inhale 2 puffs into the lungs every 6 (  six) hours as needed for shortness of breath.   amLODipine (NORVASC) 10 MG tablet Take 1 tablet (10 mg total) by mouth daily.   aspirin EC 81 MG tablet Take 81 mg by mouth at bedtime.   B Complex Vitamins (VITAMIN B COMPLEX) TABS Take 1 tablet by mouth daily.   busPIRone (BUSPAR) 5 MG tablet Take 5 mg by mouth daily.   Calcium Carbonate-Vitamin D (CALCIUM 600+D  PO) Take 1 tablet by mouth 2 (two) times daily.   chlorthalidone (HYGROTON) 25 MG tablet Take 25 mg by mouth daily.    cholecalciferol (VITAMIN D) 25 MCG (1000 UNIT) tablet Take 1,000 Units by mouth daily.   doxycycline (VIBRA-TABS) 100 MG tablet Take 1 tablet (100 mg total) by mouth 2 (two) times daily.   fluticasone (FLONASE) 50 MCG/ACT nasal spray Place 2 sprays into both nostrils daily.   fluticasone-salmeterol (ADVAIR) 250-50 MCG/ACT AEPB Inhale 1 puff into the lungs 2 (two) times daily.   folic acid (FOLVITE) 1 MG tablet TAKE 1 TABLET BY MOUTH ONCE DAILY. (Patient taking differently: Take 1 mg by mouth daily.)   gabapentin (NEURONTIN) 600 MG tablet Take 600 mg by mouth 2 (two) times daily.   lidocaine (XYLOCAINE) 2 % solution TAKE 2 TEASPOONFULS BEFORE MEALS AND AT BEDTIME AS NEEDED MAY REPEAT EVERY 4 HOURS. (MAX OF 8 DOSES PER DAY) (Patient taking differently: Use as directed 30 mLs in the mouth or throat every 4 (four) hours as needed for mouth pain. TAKE 2 TEASPOONFULS BEFORE MEALS AND AT BEDTIME AS NEEDED MAY REPEAT EVERY 4 HOURS. (MAX OF 8 DOSES PER DAY))   Lidocaine HCl (XOLIDO) 2 % CREA Apply 1 application topically daily as needed (arthritisis).   losartan (COZAAR) 100 MG tablet Take 100 mg by mouth daily.   metoprolol tartrate (LOPRESSOR) 100 MG tablet Take 100 mg by mouth 2 (two) times daily.   mirabegron ER (MYRBETRIQ) 25 MG TB24 tablet Take 1 tablet (25 mg total) by mouth daily.   mirtazapine (REMERON) 15 MG tablet Take 15 mg by mouth at bedtime.   montelukast (SINGULAIR) 10 MG tablet Take 10 mg by mouth daily.    nitroGLYCERIN (NITROSTAT) 0.4 MG SL tablet Place 0.4 mg under the tongue every 5 (five) minutes as needed for chest pain.   pantoprazole (PROTONIX) 40 MG tablet Take 40 mg by mouth daily.   potassium chloride (K-DUR,KLOR-CON) 10 MEQ tablet Take 10 mEq by mouth daily.   primidone (MYSOLINE) 50 MG tablet Take 50 mg by mouth daily.   Probiotic Product (PROBIOTIC PO) Take 1  capsule by mouth daily. [facility].   sertraline (ZOLOFT) 100 MG tablet Take 200 mg by mouth daily.    Simethicone (GAS-X PO) Take 2 tablets by mouth daily as needed (gas).   simvastatin (ZOCOR) 40 MG tablet Take 40 mg by mouth at bedtime.   vitamin B-12 (CYANOCOBALAMIN) 1000 MCG tablet Take 1,000 mcg by mouth daily.   vitamin C (ASCORBIC ACID) 500 MG tablet Take 500 mg by mouth daily.   warfarin (COUMADIN) 2.5 MG tablet Take 2.5 mg by mouth daily.     Allergies:   Amoxicillin-pot clavulanate, Biaxin [clarithromycin], Erythromycin, and Lisinopril   ROS:   Please see the history of present illness.     All other systems reviewed and are negative.   Labs/Other Tests and Data Reviewed:    Recent Labs: 04/16/2021: ALT 15; BUN 20; Creatinine, Ser 1.00; Hemoglobin 9.6; Magnesium 1.9; Platelets 105; Potassium 3.4; Sodium 142  Recent Lipid Panel Lab Results  Component Value Date/Time   CHOL 138 02/25/2021 01:14 PM   TRIG 71 02/25/2021 01:14 PM   HDL 55 02/25/2021 01:14 PM   LDLCALC 69 02/25/2021 01:14 PM    Wt Readings from Last 3 Encounters:  05/06/21 178 lb 1.3 oz (80.8 kg)  04/16/21 180 lb (81.6 kg)  04/13/21 180 lb (81.6 kg)    ASSESSMENT & PLAN:    URTI Suspected COVID 19 infection Persistent symptoms despite symptomatic treatment with DayQuil Started doxycycline as she is allergic to Augmentin and erythromycin, could be allergic to azithromycin as well Check COVID RT-PCR Continue symptomatic treatment for now  Time:   Today, I have spent 9 minutes reviewing the chart, including problem list, medications, and with the patient with telehealth technology discussing the above problems.   Medication Adjustments/Labs and Tests Ordered: Current medicines are reviewed at length with the patient today.  Concerns regarding medicines are outlined above.   Tests Ordered: No orders of the defined types were placed in this encounter.   Medication Changes: Meds  ordered this encounter  Medications   doxycycline (VIBRA-TABS) 100 MG tablet    Sig: Take 1 tablet (100 mg total) by mouth 2 (two) times daily.    Dispense:  10 tablet    Refill:  0     Note: This dictation was prepared with Dragon dictation along with smaller phrase technology. Similar sounding words can be transcribed inadequately or may not be corrected upon review. Any transcriptional errors that result from this process are unintentional.      Disposition:  Follow up  Signed, Lindell Spar, MD  05/27/2021 2:47 PM     [facility]

## 2021-05-29 LAB — SARS-COV-2, NAA 2 DAY TAT

## 2021-05-29 LAB — NOVEL CORONAVIRUS, NAA: SARS-CoV-2, NAA: NOT DETECTED

## 2021-05-31 ENCOUNTER — Telehealth: Payer: Self-pay | Admitting: Nurse Practitioner

## 2021-05-31 NOTE — Telephone Encounter (Signed)
Pt notified covid was neg

## 2021-05-31 NOTE — Telephone Encounter (Signed)
Pt called in for Covid swab results

## 2021-06-02 ENCOUNTER — Ambulatory Visit (HOSPITAL_COMMUNITY): Payer: Medicare Other | Admitting: Hematology

## 2021-06-04 ENCOUNTER — Other Ambulatory Visit: Payer: Self-pay

## 2021-06-04 ENCOUNTER — Encounter (HOSPITAL_COMMUNITY): Payer: Self-pay | Admitting: Adult Health

## 2021-06-04 ENCOUNTER — Encounter: Payer: Self-pay | Admitting: Family Medicine

## 2021-06-04 ENCOUNTER — Ambulatory Visit: Payer: BC Managed Care – PPO | Admitting: Family Medicine

## 2021-06-04 DIAGNOSIS — R5383 Other fatigue: Secondary | ICD-10-CM | POA: Diagnosis not present

## 2021-06-04 DIAGNOSIS — I10 Essential (primary) hypertension: Secondary | ICD-10-CM

## 2021-06-04 DIAGNOSIS — R531 Weakness: Secondary | ICD-10-CM | POA: Insufficient documentation

## 2021-06-04 NOTE — Assessment & Plan Note (Signed)
Fatigue, recently treated for respiratory illness states still not "feeling well" Baseline labs to include TSH and vit D

## 2021-06-04 NOTE — Addendum Note (Signed)
Addended by: Quentin Angst on: 06/04/2021 01:50 PM   Modules accepted: Orders

## 2021-06-04 NOTE — Patient Instructions (Addendum)
F/U in office as before Courtney Rogers), call if you need [place] sooner  Please come today around 1:30 LABS FOR Cbc, CHEM 7 AND egfr , tsh AND VIT d TODAY,  We will call with your results next week  Please commit to using your allergy nasal spray every day  Use tylenol 325 mg one every 8 hours if needed  Thanks for choosing [facility], we consider it a privelige to serve you.

## 2021-06-04 NOTE — Progress Notes (Signed)
Virtual Visit via Telephone Note  I connected with [name] on 06/04/21 at 11:40 AM EST by telephone and verified that I am speaking with the correct person using two identifiers.  Location: Patient: home Provider:    I discussed the limitations, risks, security and privacy concerns of performing an evaluation and management service by telephone and the availability of in person appointments. I also discussed with the patient that there may be a patient responsible charge related to this service. The patient expressed understanding and agreed to proceed.   History of Present Illness: Chills and  and no energy x 2 weeks, no body aches, drainage from nose is clear, no cough, no ear pain or sore throat Has completed doxycyclie, no change in symptoms, no other family member or close contact is ill Reports fair sleep    Observations/Objective:  There were no vitals taken for this visit. Good communication with no confusion and intact memory. Alert and oriented x 3 No signs of respiratory distress during speech  Assessment and Plan:  Fatigue Fatigue, recently treated for respiratory illness states still not "feeling well" Baseline labs to include TSH and vit D  Follow Up Instructions:    I discussed the assessment and treatment plan with the patient. The patient was provided an opportunity to ask questions and all were answered. The patient agreed with the plan and demonstrated an understanding of the instructions.   The patient was advised to call back or seek an in-person evaluation if the symptoms worsen or if the condition fails to improve as anticipated.  I provided 11 minutes of non-face-to-face time during this encounter.   Tula Nakayama, MD

## 2021-06-05 LAB — BMP8+EGFR
BUN/Creatinine Ratio: 22 (ref 12–28)
BUN: 22 mg/dL (ref 8–27)
CO2: 24 mmol/L (ref 20–29)
Calcium: 9.6 mg/dL (ref 8.7–10.3)
Chloride: 95 mmol/L — ABNORMAL LOW (ref 96–106)
Creatinine, Ser: 1.02 mg/dL — ABNORMAL HIGH (ref 0.57–1.00)
Glucose: 94 mg/dL (ref 70–99)
Potassium: 3.8 mmol/L (ref 3.5–5.2)
Sodium: 137 mmol/L (ref 134–144)
eGFR: 58 mL/min/{1.73_m2} — ABNORMAL LOW (ref >59)

## 2021-06-05 LAB — CBC
Hematocrit: 31.5 % — ABNORMAL LOW (ref 34.0–46.6)
Hemoglobin: 11 g/dL — ABNORMAL LOW (ref 11.1–15.9)
MCH: 32.2 pg (ref 26.6–33.0)
MCHC: 34.9 g/dL (ref 31.5–35.7)
MCV: 92 fL (ref 79–97)
Platelets: 137 10*3/uL — ABNORMAL LOW (ref 150–450)
RBC: 3.42 x10E6/uL — ABNORMAL LOW (ref 3.77–5.28)
RDW: 15.7 % — ABNORMAL HIGH (ref 11.7–15.4)
WBC: 7.2 10*3/uL (ref 3.4–10.8)

## 2021-06-05 LAB — TSH: TSH: 0.644 u[IU]/mL (ref 0.450–4.500)

## 2021-06-05 LAB — VITAMIN D 25 HYDROXY (VIT D DEFICIENCY, FRACTURES): Vit D, 25-Hydroxy: 56.3 ng/mL (ref 30.0–100.0)

## 2021-06-09 ENCOUNTER — Inpatient Hospital Stay (HOSPITAL_COMMUNITY): Payer: Medicare Other

## 2021-06-09 DIAGNOSIS — K589 Irritable bowel syndrome without diarrhea: Secondary | ICD-10-CM | POA: Diagnosis not present

## 2021-06-09 DIAGNOSIS — Z888 Allergy status to other drugs, medicaments and biological substances status: Secondary | ICD-10-CM | POA: Diagnosis not present

## 2021-06-09 DIAGNOSIS — D472 Monoclonal gammopathy: Secondary | ICD-10-CM

## 2021-06-09 DIAGNOSIS — Z90722 Acquired absence of ovaries, bilateral: Secondary | ICD-10-CM | POA: Diagnosis not present

## 2021-06-09 DIAGNOSIS — T465X6A Underdosing of other antihypertensive drugs, initial encounter: Secondary | ICD-10-CM | POA: Diagnosis not present

## 2021-06-09 DIAGNOSIS — I252 Old myocardial infarction: Secondary | ICD-10-CM | POA: Diagnosis not present

## 2021-06-09 DIAGNOSIS — I2510 Atherosclerotic heart disease of native coronary artery without angina pectoris: Secondary | ICD-10-CM | POA: Diagnosis not present

## 2021-06-09 DIAGNOSIS — K30 Functional dyspepsia: Secondary | ICD-10-CM | POA: Diagnosis not present

## 2021-06-09 DIAGNOSIS — R002 Palpitations: Secondary | ICD-10-CM | POA: Diagnosis not present

## 2021-06-09 DIAGNOSIS — F32A Depression, unspecified: Secondary | ICD-10-CM | POA: Diagnosis not present

## 2021-06-09 DIAGNOSIS — Z881 Allergy status to other antibiotic agents status: Secondary | ICD-10-CM | POA: Diagnosis not present

## 2021-06-09 DIAGNOSIS — C9000 Multiple myeloma not having achieved remission: Secondary | ICD-10-CM | POA: Diagnosis present

## 2021-06-09 DIAGNOSIS — Z880 Allergy status to penicillin: Secondary | ICD-10-CM | POA: Diagnosis not present

## 2021-06-09 DIAGNOSIS — D649 Anemia, unspecified: Secondary | ICD-10-CM | POA: Diagnosis not present

## 2021-06-09 DIAGNOSIS — Z86711 Personal history of pulmonary embolism: Secondary | ICD-10-CM | POA: Diagnosis not present

## 2021-06-09 DIAGNOSIS — Z7901 Long term (current) use of anticoagulants: Secondary | ICD-10-CM | POA: Diagnosis not present

## 2021-06-09 DIAGNOSIS — I129 Hypertensive chronic kidney disease with stage 1 through stage 4 chronic kidney disease, or unspecified chronic kidney disease: Secondary | ICD-10-CM | POA: Diagnosis not present

## 2021-06-09 DIAGNOSIS — R000 Tachycardia, unspecified: Secondary | ICD-10-CM | POA: Diagnosis not present

## 2021-06-09 DIAGNOSIS — Z79899 Other long term (current) drug therapy: Secondary | ICD-10-CM | POA: Diagnosis not present

## 2021-06-09 DIAGNOSIS — E785 Hyperlipidemia, unspecified: Secondary | ICD-10-CM | POA: Diagnosis not present

## 2021-06-09 DIAGNOSIS — K5900 Constipation, unspecified: Secondary | ICD-10-CM | POA: Diagnosis not present

## 2021-06-09 DIAGNOSIS — G479 Sleep disorder, unspecified: Secondary | ICD-10-CM | POA: Diagnosis not present

## 2021-06-09 DIAGNOSIS — Z8719 Personal history of other diseases of the digestive system: Secondary | ICD-10-CM | POA: Diagnosis not present

## 2021-06-09 LAB — IRON AND TIBC
Iron: 70 ug/dL (ref 28–170)
Saturation Ratios: 24 % (ref 10.4–31.8)
TIBC: 291 ug/dL (ref 250–450)
UIBC: 221 ug/dL

## 2021-06-09 LAB — CBC WITH DIFFERENTIAL/PLATELET
Abs Immature Granulocytes: 0.05 10*3/uL (ref 0.00–0.07)
Basophils Absolute: 0 10*3/uL (ref 0.0–0.1)
Basophils Relative: 0 %
Eosinophils Absolute: 0.1 10*3/uL (ref 0.0–0.5)
Eosinophils Relative: 2 %
HCT: 32.8 % — ABNORMAL LOW (ref 36.0–46.0)
Hemoglobin: 10.8 g/dL — ABNORMAL LOW (ref 12.0–15.0)
Immature Granulocytes: 1 %
Lymphocytes Relative: 19 %
Lymphs Abs: 1.3 10*3/uL (ref 0.7–4.0)
MCH: 32.6 pg (ref 26.0–34.0)
MCHC: 32.9 g/dL (ref 30.0–36.0)
MCV: 99.1 fL (ref 80.0–100.0)
Monocytes Absolute: 0.6 10*3/uL (ref 0.1–1.0)
Monocytes Relative: 8 %
Neutro Abs: 4.9 10*3/uL (ref 1.7–7.7)
Neutrophils Relative %: 70 %
Platelets: 120 10*3/uL — ABNORMAL LOW (ref 150–400)
RBC: 3.31 MIL/uL — ABNORMAL LOW (ref 3.87–5.11)
RDW: 16.5 % — ABNORMAL HIGH (ref 11.5–15.5)
WBC: 7 10*3/uL (ref 4.0–10.5)
nRBC: 0 % (ref 0.0–0.2)

## 2021-06-09 LAB — COMPREHENSIVE METABOLIC PANEL
ALT: 13 U/L (ref 0–44)
AST: 23 U/L (ref 15–41)
Albumin: 3.8 g/dL (ref 3.5–5.0)
Alkaline Phosphatase: 48 U/L (ref 38–126)
Anion gap: 10 (ref 5–15)
BUN: 18 mg/dL (ref 8–23)
CO2: 26 mmol/L (ref 22–32)
Calcium: 9.1 mg/dL (ref 8.9–10.3)
Chloride: 100 mmol/L (ref 98–111)
Creatinine, Ser: 1.05 mg/dL — ABNORMAL HIGH (ref 0.44–1.00)
GFR, Estimated: 56 mL/min — ABNORMAL LOW (ref >60)
Glucose, Bld: 102 mg/dL — ABNORMAL HIGH (ref 70–99)
Potassium: 3.6 mmol/L (ref 3.5–5.1)
Sodium: 136 mmol/L (ref 135–145)
Total Bilirubin: 0.6 mg/dL (ref 0.3–1.2)
Total Protein: 10.2 g/dL — ABNORMAL HIGH (ref 6.5–8.1)

## 2021-06-09 LAB — LACTATE DEHYDROGENASE: LDH: 135 U/L (ref 98–192)

## 2021-06-09 LAB — FERRITIN: Ferritin: 279 ng/mL (ref 11–307)

## 2021-06-10 LAB — KAPPA/LAMBDA LIGHT CHAINS
Kappa free light chain: 48.8 mg/L — ABNORMAL HIGH (ref 3.3–19.4)
Kappa, lambda light chain ratio: 17.43 — ABNORMAL HIGH (ref 0.26–1.65)
Lambda free light chains: 2.8 mg/L — ABNORMAL LOW (ref 5.7–26.3)

## 2021-06-11 LAB — PROTEIN ELECTROPHORESIS, SERUM
A/G Ratio: 0.6 — ABNORMAL LOW (ref 0.7–1.7)
Albumin ELP: 3.9 g/dL (ref 2.9–4.4)
Alpha-1-Globulin: 0.3 g/dL (ref 0.0–0.4)
Alpha-2-Globulin: 0.6 g/dL (ref 0.4–1.0)
Beta Globulin: 1.2 g/dL (ref 0.7–1.3)
Gamma Globulin: 4.3 g/dL — ABNORMAL HIGH (ref 0.4–1.8)
Globulin, Total: 6.5 g/dL — ABNORMAL HIGH (ref 2.2–3.9)
M-Spike, %: 3.5 g/dL — ABNORMAL HIGH
Total Protein ELP: 10.4 g/dL — ABNORMAL HIGH (ref 6.0–8.5)

## 2021-06-16 ENCOUNTER — Other Ambulatory Visit: Payer: Self-pay

## 2021-06-16 ENCOUNTER — Inpatient Hospital Stay (HOSPITAL_COMMUNITY): Payer: Medicare Other | Admitting: Hematology

## 2021-06-16 VITALS — Ht 64.0 in | Wt 163.1 lb

## 2021-06-16 DIAGNOSIS — D472 Monoclonal gammopathy: Secondary | ICD-10-CM | POA: Diagnosis not present

## 2021-06-16 DIAGNOSIS — C9000 Multiple myeloma not having achieved remission: Secondary | ICD-10-CM | POA: Diagnosis not present

## 2021-06-16 NOTE — Progress Notes (Signed)
[facility] [place], [place] [address]   CLINIC:  Medical Oncology/Hematology  PCP:  Renee Rival, FNP [address] / [place] [address] [phone]   REASON FOR VISIT:  Follow-up for smoldering myeloma  PRIOR THERAPY: none  CURRENT THERAPY: surveillance  INTERVAL HISTORY:  [name], a 71 y.o. female, returns for routine follow-up of her smoldering myeloma. Courtney Rogers was last seen on 01/28/2021.   Today Courtney Rogers reports feeling fair. Courtney Rogers reports increased fatigue and decreased appetite since 01/01 which is improving. Courtney Rogers denies new pains, cough, tingling/numbness, current bleeding, and black stools. Courtney Rogers lives with her older sister and her husband who has dementia. Courtney Rogers drives herself to her appointments, and Courtney Rogers is able to do her typical home activities unassisted. Courtney Rogers walks with a walker since a right knee replacement in April 2022; Courtney Rogers feels continued stiffness in her right knee but denies associated pain. Courtney Rogers is not currently taking iron tablets as Courtney Rogers reports they upset her stomach. Courtney Rogers denies history of A-fib. Courtney Rogers reports MI in 2009. Courtney Rogers stopped taking metoprolol as Courtney Rogers reports Courtney Rogers read in a newspaper that Courtney Rogers shouldn't take it with losartan.   REVIEW OF SYSTEMS:  Review of Systems  Constitutional:  Positive for appetite change and fatigue.  HENT:   Negative for nosebleeds.   Respiratory:  Negative for cough and hemoptysis.   Cardiovascular:  Positive for palpitations.  Gastrointestinal:  Positive for constipation. Negative for blood in stool.  Genitourinary:  Positive for frequency. Negative for hematuria and vaginal bleeding.   Musculoskeletal:  Negative for arthralgias and myalgias.  Neurological:  Positive for headaches. Negative for numbness.  Hematological:  Does not bruise/bleed easily.  Psychiatric/Behavioral:  Positive for depression and sleep disturbance. The patient is nervous/anxious.   All other  systems reviewed and are negative.  PAST MEDICAL/SURGICAL HISTORY:  Past Medical History:  Diagnosis Date   Acute myocardial infarction [facility]) 2009   CAD/no stent medically managed   Anxiety disorder    CAD (coronary artery disease)    Hyperlipidemia    Hypertension    IBS (irritable bowel syndrome)    Non-ulcer dyspepsia 04/30/2009   Qualifier: Diagnosis of  By: Oneida Alar MD, Sandi L    Overactive bladder    PE (pulmonary embolism) JUN 2014   Presence of permanent cardiac pacemaker    Sleep apnea    Past Surgical History:  Procedure Laterality Date   ABDOMINAL HYSTERECTOMY     BSO secondary to cyst     CARDIAC CATHETERIZATION     COLONOSCOPY  12/2009   Dr. West Carbo, propofol, normal. Next TCS 12/2019   COLONOSCOPY N/A 05/22/2017   Procedure: COLONOSCOPY;  Surgeon: Danie Binder, MD;  [facility];  Service: Endoscopy;  Laterality: N/A;  10:30am   ESOPHAGOGASTRODUODENOSCOPY  05/11/2009   schatzki ring/small hiatal hernia/path:gastritis   ESOPHAGOGASTRODUODENOSCOPY N/A 05/22/2017   Procedure: ESOPHAGOGASTRODUODENOSCOPY (EGD);  Surgeon: Danie Binder, MD;  Location: [facility];  Service: Endoscopy;  Laterality: N/A;   ESOPHAGOGASTRODUODENOSCOPY (EGD) WITH ESOPHAGEAL DILATION N/A 04/01/2013   RAX:ENMMHWKGS at the gastroesophageal juction/multiple small polyps/mild gastritis   GIVENS CAPSULE STUDY N/A 06/07/2017   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  SOCIAL HISTORY:  Social History   Socioeconomic History   Marital status: Married    Spouse name: [name]   Number of children: 3   Years of education: 16   Highest education level: Not on file  Occupational History   Not on file  Tobacco Use   Smoking status: Never   Smokeless tobacco:  Never   Tobacco comments:    Never smoked   Vaping Use   Vaping Use: Never used  Substance and Sexual Activity   Alcohol use: Yes    Alcohol/week: 0.0 standard drinks    Comment: occ wine   Drug use: No   Sexual activity: Not Currently  Other Topics Concern   Not on file  Social History Narrative      Lives with husband-51 years 81 in Aug 2021   Daughter is close by, 2 sons live further away   One IN [place],ONE IN [place], [place].        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   Enjoys: reading, young adult      Diet: eats all food groups    Caffeine: coffee 1, tea daily soda-1 daily   Water: 1-2 cups      Wears seat belt    Does not use phone while driving   Oceanographer at home    Public house manager -locked up      Left handed   One story home   Drinks caffeine   Social Determinants of Health   Financial Resource Strain: Low Risk    Difficulty of Paying Living Expenses: Not hard at all  Food Insecurity: No Food Insecurity   Worried About Charity fundraiser in the Last Year: Never true   Arboriculturist in the Last Year: Never true  Transportation Needs: No Transportation Needs   Lack of Transportation (Medical): No   Lack of Transportation (Non-Medical): No  Physical Activity: Inactive   Days of Exercise per Week: 0 days   Minutes of Exercise per Session: 0 min  Stress: No Stress Concern Present   Feeling of Stress : Not at all  Social Connections: Moderately Integrated   Frequency of Communication with Friends and Family: Once a week   Frequency of Social Gatherings with Friends and Family: Once a week   Attends Religious Services: More than 4 times per year   Active Member of [employer] or Organizations: Yes   Attends Music therapist: More than 4 times per year   Marital Status: Married  Human resources officer Violence: Not At Risk   Fear of Current or Ex-Partner: No   Emotionally Abused: No   Physically Abused: No    Sexually Abused: No    FAMILY HISTORY:  Family History  Problem Relation Age of Onset   Colon polyps Neg Hx    Colon cancer Neg Hx     CURRENT MEDICATIONS:  Current Outpatient Medications  Medication Sig Dispense Refill   acetaminophen (TYLENOL) 500 MG tablet Take 1,000 mg by mouth 2 (two) times daily as needed for moderate pain or headache.     albuterol (PROVENTIL HFA;VENTOLIN HFA) 108 (90 BASE) MCG/ACT inhaler Inhale 2 puffs into the lungs every 6 (six) hours as needed for shortness of breath.     amLODipine (NORVASC) 10 MG tablet Take 1 tablet (10 mg total) by mouth daily. 90 tablet 1   aspirin EC 81 MG tablet Take 81 mg by mouth at bedtime.  B Complex Vitamins (VITAMIN B COMPLEX) TABS Take 1 tablet by mouth daily.     busPIRone (BUSPAR) 5 MG tablet Take 5 mg by mouth daily.     Calcium Carbonate-Vitamin D (CALCIUM 600+D PO) Take 1 tablet by mouth 2 (two) times daily.     chlorthalidone (HYGROTON) 25 MG tablet Take 25 mg by mouth daily.      cholecalciferol (VITAMIN D) 25 MCG (1000 UNIT) tablet Take 1,000 Units by mouth daily.     fluticasone (FLONASE) 50 MCG/ACT nasal spray Place 2 sprays into both nostrils daily. 16 g 6   fluticasone-salmeterol (ADVAIR) 250-50 MCG/ACT AEPB Inhale 1 puff into the lungs 2 (two) times daily.     folic acid (FOLVITE) 1 MG tablet TAKE 1 TABLET BY MOUTH ONCE DAILY. (Patient taking differently: Take 1 mg by mouth daily.) 30 tablet 11   gabapentin (NEURONTIN) 600 MG tablet Take 600 mg by mouth 2 (two) times daily.     lidocaine (XYLOCAINE) 2 % solution TAKE 2 TEASPOONFULS BEFORE MEALS AND AT BEDTIME AS NEEDED MAY REPEAT EVERY 4 HOURS. (MAX OF 8 DOSES PER DAY) 300 mL 0   Lidocaine HCl (XOLIDO) 2 % CREA Apply 1 application topically daily as needed (arthritisis).     losartan (COZAAR) 100 MG tablet Take 100 mg by mouth daily.     metoprolol tartrate (LOPRESSOR) 100 MG tablet Take 100 mg by mouth 2 (two) times daily.     mirabegron ER (MYRBETRIQ) 25 MG  TB24 tablet Take 1 tablet (25 mg total) by mouth daily. 30 tablet 2   montelukast (SINGULAIR) 10 MG tablet Take 10 mg by mouth daily.      pantoprazole (PROTONIX) 40 MG tablet Take 40 mg by mouth daily.     potassium chloride (K-DUR,KLOR-CON) 10 MEQ tablet Take 10 mEq by mouth daily.     primidone (MYSOLINE) 50 MG tablet Take 50 mg by mouth daily.     Probiotic Product (PROBIOTIC PO) Take 1 capsule by mouth daily. [facility].     sertraline (ZOLOFT) 100 MG tablet Take 200 mg by mouth daily.      Simethicone (GAS-X PO) Take 2 tablets by mouth daily as needed (gas).     simvastatin (ZOCOR) 40 MG tablet Take 40 mg by mouth at bedtime.     vitamin B-12 (CYANOCOBALAMIN) 1000 MCG tablet Take 1,000 mcg by mouth daily.     vitamin C (ASCORBIC ACID) 500 MG tablet Take 500 mg by mouth daily.     warfarin (COUMADIN) 2.5 MG tablet Take 2.5 mg by mouth daily.     mirtazapine (REMERON) 15 MG tablet Take 15 mg by mouth at bedtime. (Patient not taking: Reported on 06/16/2021)     nitroGLYCERIN (NITROSTAT) 0.4 MG SL tablet Place 0.4 mg under the tongue every 5 (five) minutes as needed for chest pain. (Patient not taking: Reported on 06/16/2021)     No current facility-administered medications for this visit.    ALLERGIES:  Allergies  Allergen Reactions   Amoxicillin-Pot Clavulanate Other (See Comments)   Biaxin [Clarithromycin] Other (See Comments)    Stomach problems    Erythromycin    Lisinopril Swelling    PHYSICAL EXAM:  Performance status (ECOG): 1 - Symptomatic but completely ambulatory  There were no vitals filed for this visit. Wt Readings from Last 3 Encounters:  06/16/21 163 lb 2.3 oz (74 kg)  05/06/21 178 lb 1.3 oz (80.8 kg)  04/16/21 180 lb (81.6 kg)   Physical Exam  Vitals reviewed.  Constitutional:      Appearance: Normal appearance.  Cardiovascular:     Rate and Rhythm: Normal rate and regular rhythm.     Pulses: Normal pulses.     Heart sounds: Normal heart sounds.   Pulmonary:     Effort: Pulmonary effort is normal.     Breath sounds: Normal breath sounds.  Neurological:     General: No focal deficit present.     Mental Status: Courtney Rogers is alert and oriented to person, place, and time.  Psychiatric:        Mood and Affect: Mood normal.        Behavior: Behavior normal.     LABORATORY DATA:  I have reviewed the labs as listed.  CBC Latest Ref Rng & Units 06/09/2021 06/04/2021 04/16/2021  WBC 4.0 - 10.5 K/uL 7.0 7.2 5.9  Hemoglobin 12.0 - 15.0 g/dL 10.8(L) 11.0(L) 9.6(L)  Hematocrit 36.0 - 46.0 % 32.8(L) 31.5(L) 30.5(L)  Platelets 150 - 400 K/uL 120(L) 137(L) 105(L)   CMP Latest Ref Rng & Units 06/09/2021 06/04/2021 04/16/2021  Glucose 70 - 99 mg/dL 102(H) 94 95  BUN 8 - 23 mg/dL '18 22 20  ' Creatinine 0.44 - 1.00 mg/dL 1.05(H) 1.02(H) 1.00  Sodium 135 - 145 mmol/L 136 137 142  Potassium 3.5 - 5.1 mmol/L 3.6 3.8 3.4(L)  Chloride 98 - 111 mmol/L 100 95(L) 104  CO2 22 - 32 mmol/L '26 24 25  ' Calcium 8.9 - 10.3 mg/dL 9.1 9.6 9.3  Total Protein 6.5 - 8.1 g/dL 10.2(H) - 9.9(H)  Total Bilirubin 0.3 - 1.2 mg/dL 0.6 - 0.5  Alkaline Phos 38 - 126 U/L 48 - 69  AST 15 - 41 U/L 23 - 25  ALT 0 - 44 U/L 13 - 15    DIAGNOSTIC IMAGING:  I have independently reviewed the scans and discussed with the patient. No results found.   ASSESSMENT:  1.  IgA kappa smoldering myeloma: -BMBX on 07/09/2019 shows hypercellular marrow for age with trilineage hematopoiesis.  Increased number of atypical plasma cells present 36% of all cell lines.  Plasma cells are kappa light chain restricted. -Unfortunately her specimen has reached cytogenetics and FISH lab week later due to bad weather and no viable plasma cells. -PET scan on 07/09/2019 showed no findings of active myeloma. -24-hour urine shows total protein 59 mg.  Urine immunofixation shows IgA kappa monoclonal protein.  LDH is 184. -Based on [facility] criteria Courtney Rogers has high risk with about 45-50% probability of progression  to myeloma in the next 2 years. -CTAP on 01/07/2020 shows acute superior endplate burst fracture of L1.  Mild associated paravertebral soft tissue thickening.  No other bone abnormalities. -Bone density test on 02/07/2020 shows T score -0.5.   PLAN:  1.  IgA kappa smoldering myeloma: - Courtney Rogers does not report any new onset pains.  However Courtney Rogers complains of feeling fatigued.  Also reports decreased appetite.  No new onset neuropathy.  No bleeding per rectum or melena. - Courtney Rogers uses walker to ambulate since her right knee replacement leading to stiffness.  Courtney Rogers lives with older sister and husband who has dementia.  Courtney Rogers is independent of ADLs and IADLs.  Courtney Rogers also drives. - Reviewed myeloma labs from 06/09/2021.  M spike has increased to 3.5 g.  Hemoglobin 10.8.  Calcium was 9.1.  Creatinine was 1.05, trending up.  Kappa light chains or 48.8 and ratio is 17.43 and steadily going up. - Due to worsening of M spike and  free light chains, slight worsening of renal function and her hemoglobin, I have recommended restaging PET CT scan.  I have also recommended bone marrow biopsy as the previous bone marrow biopsy sample is not viable for cytogenetics and FISH panel. - RTC after the bone marrow biopsy.   2.  Normocytic anemia: - Hemoglobin today is 10.8 with MCV 99.  Ferritin was 279 and percent saturation 24. 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  Courtney Rogers cannot take iron tablet due to stomach upset.   3.  Pulmonary embolism: - Courtney Rogers had pulmonary embolism in 2015.  Continue Coumadin daily.  No bleeding issues.  4.  Hypertension: - Courtney Rogers is taking amlodipine 10 mg daily. - Courtney Rogers is tachycardic.  Courtney Rogers was told to stop taking metoprolol 100 mg twice daily. - Blood pressure today is 139/77.  I have told her to restart metoprolol half tablet twice daily.   Orders placed this encounter:  Orders Placed This Encounter  Procedures   CT Biopsy   CT BONE MARROW BIOPSY & ASPIRATION   NM PET Image Restag (PS) Skull Base To Thigh      Derek Jack, MD [place] [phone]   I, Thana Ates, am acting as a scribe for Dr. Derek Jack.  I, Derek Jack MD, have reviewed the above documentation for accuracy and completeness, and I agree with the above.

## 2021-06-16 NOTE — Patient Instructions (Addendum)
[place] at [facility] Discharge Instructions   You were seen and examined today by Dr. Delton Coombes.  The protein in your blood, your m-spike, has increased.  We will repeat a PET scan at [facility], and have you go to [facility] for a bone marrow biopsy to investigate this further.   Return as scheduled after PET scan and bone marrow biopsy.      Thank you for choosing [place] at [facility] to provide your oncology and hematology care.  To afford each patient quality time with our provider, please arrive at least 15 minutes before your scheduled appointment time.   If you have a lab appointment with the [facility] please come in thru the Main Entrance and check in at the main information desk.  You need to re-schedule your appointment should you arrive 10 or more minutes late.  We strive to give you quality time with our providers, and arriving late affects you and other patients whose appointments are after yours.  Also, if you no show three or more times for appointments you may be dismissed from the clinic at the providers discretion.     Again, thank you for choosing [facility].  Our hope is that these requests will decrease the amount of time that you wait before being seen by our physicians.       _____________________________________________________________  Should you have questions after your visit to [facility], please contact our office at [phone] and follow the prompts.  Our office hours are 8:00 a.m. and 4:30 p.m. Monday - Friday.  Please note that voicemails left after 4:00 p.m. may not be returned until the following business day.  We are closed weekends and major holidays.  You do have access to a nurse 24-7, just call the main number to the clinic [phone] and do not press any options, hold on the line and a nurse will answer the phone.    For prescription refill requests, have your  pharmacy contact our office and allow 72 hours.    Due to Covid, you will need to wear a mask upon entering the hospital. If you do not have a mask, a mask will be given to you at the Main Entrance upon arrival. For doctor visits, patients may have 1 support person age 6 or older with them. For treatment visits, patients can not have anyone with them due to social distancing guidelines and our immunocompromised population.

## 2021-06-18 ENCOUNTER — Encounter: Payer: Self-pay | Admitting: Nurse Practitioner

## 2021-06-18 ENCOUNTER — Other Ambulatory Visit: Payer: Self-pay

## 2021-06-18 ENCOUNTER — Ambulatory Visit: Payer: Medicare Other | Admitting: Nurse Practitioner

## 2021-06-18 VITALS — BP 118/68 | HR 97 | Resp 17 | Ht 64.0 in | Wt 165.1 lb

## 2021-06-18 DIAGNOSIS — R5383 Other fatigue: Secondary | ICD-10-CM

## 2021-06-18 DIAGNOSIS — I10 Essential (primary) hypertension: Secondary | ICD-10-CM | POA: Diagnosis not present

## 2021-06-18 DIAGNOSIS — D472 Monoclonal gammopathy: Secondary | ICD-10-CM

## 2021-06-18 DIAGNOSIS — F419 Anxiety disorder, unspecified: Secondary | ICD-10-CM | POA: Diagnosis not present

## 2021-06-18 DIAGNOSIS — F32A Depression, unspecified: Secondary | ICD-10-CM

## 2021-06-18 MED ORDER — METOPROLOL TARTRATE 50 MG PO TABS: 50.0000 mg | ORAL_TABLET | Freq: Two times a day (BID) | ORAL | 3 refills | Status: DC

## 2021-06-18 NOTE — Assessment & Plan Note (Signed)
PHQ-9 score 10 GAD score 3. Patient referred to [facility] for counseling Patient denies suicidal ideation or homicidal ideation Continue Zoloft 100 mg daily. Buspar 5 mg daily

## 2021-06-18 NOTE — Patient Instructions (Addendum)
Take metoprolol 50mg  two times daily for your blood pressure. Take boost breeze nutritional drink , get drink at your pharmacy.

## 2021-06-18 NOTE — Assessment & Plan Note (Signed)
Followed by hematology.  Has upcoming PET scan and bone marrow biopsy.

## 2021-06-18 NOTE — Assessment & Plan Note (Signed)
DASH diet and commitment to daily physical activity for a minimum of 30 minutes discussed and encouraged, as a part of hypertension management. The importance of attaining a healthy weight is also discussed.  BP/Weight 06/18/2021 06/16/2021 05/06/2021 04/16/2021 04/13/2021 04/08/2021 56/70/1410  Systolic BP 301 - 314 388 875 797 -  Diastolic BP 68 - 84 89 70 61 -  Wt. (Lbs) 165.12 163.14 178.08 180 180 - 170  BMI 28.34 28 30.57 30.9 30.9 - 28.29  she had stopped taking metoprolol  But hematologist told her to start taking metoprolol 50mg  BID.  Take metoprolol 50 mg 2 times daily. Continue amlodipine 10mg  daily Losartan 100mg  daily

## 2021-06-18 NOTE — Assessment & Plan Note (Addendum)
Probably due to patient's anemia.  Patient is being followed by hematology. Patient told to take boost breeze nutritional drink.  She stated that Ensure upsets her stomach.  Drink plenty of water to stay hydrated

## 2021-06-18 NOTE — Progress Notes (Signed)
° °  Courtney Rogers     MRN: [id number]      DOB: [birth year]   HPI [name] is here for complaints of weakness, fatigue and no energy. She had an appointment with hematology yesterday for her smoldering myeloma.  Patient has an upcoming appointment for a PET scan and bone marrow biopsy . she has been having decreased appetite and feels tired. tried ensure but it gives her stomach upset.   Depression and anxiety she states that she has been worried about different things patient denies suicidal ideation or homicidal ideation.  Patient agrees to referral for counseling.   HTN.  Patient stated that oncologist said  she should start taking metoprolol 50 mg 2 times daily due to her palpitations.   ROS Denies recent fever or chills., has loss of appetite and fatigue Denies sinus pressure, nasal congestion, ear pain or sore throat. Denies chest congestion, productive cough or wheezing. Denies chest pains, palpitations and leg swelling Denies abdominal pain, nausea, vomiting,diarrhea or constipation.   Denies joint pain, swelling and limitation in mobility uses a walker Denies headaches, seizures, numbness, or tingling. Has  depression, anxiety    PE  BP 118/68    Pulse 97    Resp 17    Ht $R'5\' 4"'AX$  (1.626 m)    Wt 165 lb 1.9 oz (74.9 kg)    SpO2 96%    BMI 28.34 kg/m   Patient alert and oriented and in no cardiopulmonary distress.  Chest: Clear to auscultation bilaterally.  CVS: S1, S2 no murmurs, no S3.Regular rate.  ABD: Soft non tender.   Ext: No edema  MS: Adequate ROM spine, shoulders, hips and knees uses a walker  Psych: Good eye contact, normal affect. Memory intact not anxious or depressed appearing.  CNS: CN 2-12 intact, power,  normal throughout.no focal deficits noted.   Assessment & Plan

## 2021-06-21 ENCOUNTER — Telehealth: Payer: Self-pay | Admitting: *Deleted

## 2021-06-21 NOTE — Chronic Care Management (AMB) (Signed)
°  Care Management   Note  06/21/2021 Name: Courtney Rogers MRN: [id number] DOB: 1951-01-28  Courtney Rogers is a 72 y.o. year old female who is a primary care patient of Renee Rival, FNP. I reached out to [name] by phone today in response to a referral sent by Courtney Rogers's primary care provider.   Courtney Rogers was given information about care management services today including:  Care management services include personalized support from designated clinical staff supervised by her physician, including individualized plan of care and coordination with other care providers 24/7 contact phone numbers for assistance for urgent and routine care needs. The patient may stop care management services at any time by phone call to the office staff.  Patient agreed to services and verbal consent obtained.   Follow up plan: Telephone appointment with care management team member scheduled for:06/25/21  [facility]  Direct Dial: [phone]

## 2021-06-24 ENCOUNTER — Encounter (HOSPITAL_COMMUNITY): Payer: Self-pay | Admitting: Adult Health

## 2021-06-24 ENCOUNTER — Ambulatory Visit (HOSPITAL_COMMUNITY)
Admission: RE | Admit: 2021-06-24 | Discharge: 2021-06-24 | Disposition: A | Discharge status: Home / Self Care | Payer: Medicare Other | Source: Ambulatory Visit | Attending: Hematology | Admitting: Hematology

## 2021-06-24 ENCOUNTER — Other Ambulatory Visit: Payer: Self-pay

## 2021-06-24 DIAGNOSIS — D472 Monoclonal gammopathy: Secondary | ICD-10-CM | POA: Diagnosis not present

## 2021-06-24 MED ORDER — FLUDEOXYGLUCOSE F - 18 (FDG) INJECTION
8.8500 | Freq: Once | INTRAVENOUS | Status: AC
  Administered 2021-06-24: 8.85 via INTRAVENOUS

## 2021-06-25 ENCOUNTER — Ambulatory Visit (INDEPENDENT_AMBULATORY_CARE_PROVIDER_SITE_OTHER): Payer: BC Managed Care – PPO

## 2021-06-25 DIAGNOSIS — G8929 Other chronic pain: Secondary | ICD-10-CM

## 2021-06-25 DIAGNOSIS — D472 Monoclonal gammopathy: Secondary | ICD-10-CM

## 2021-06-25 DIAGNOSIS — M25561 Pain in right knee: Secondary | ICD-10-CM

## 2021-06-25 DIAGNOSIS — K219 Gastro-esophageal reflux disease without esophagitis: Secondary | ICD-10-CM

## 2021-06-25 DIAGNOSIS — F419 Anxiety disorder, unspecified: Secondary | ICD-10-CM

## 2021-06-25 DIAGNOSIS — R251 Tremor, unspecified: Secondary | ICD-10-CM

## 2021-06-25 DIAGNOSIS — D508 Other iron deficiency anemias: Secondary | ICD-10-CM

## 2021-06-25 DIAGNOSIS — R5383 Other fatigue: Secondary | ICD-10-CM

## 2021-06-25 NOTE — Patient Instructions (Addendum)
Visit Information  Patient Goals:  Manage Anxiety issues. Manage Caregiver Stress issues  Time Frame:  Short Term Goals Priority;  Medium Progress;  On Track  Start Date:  06/25/21 Expected End Date:  09/20/21  Follow up Date:  07/23/21 at 1:00 PM  Manage Anxiety issues. Manage Caregiver Stress issues  Patient Self Care Activities:  Attends all scheduled provider appointments Performs ADL's independently  Patient Coping Strengths:  Family Friends  Patient Self Care Deficits:  Walking challenges Caregiver stress issues Sleeping challenges  Patient Goals:  - spend time or talk with others at least 2 to 3 times per week - practice relaxation or meditation daily - keep a calendar with appointment dates  Follow Up Plan: LCSW to call client on 07/23/21 at 1:00 PM   Norva Riffle.Sierria Bruney MSW, Britton Holiday representative [facility] Care Management [phone]

## 2021-06-25 NOTE — Chronic Care Management (AMB) (Signed)
Chronic Care Management    Clinical Social Work Note  06/25/2021 Name: Courtney Rogers MRN: [id number] DOB: 12-02-1950  Courtney Rogers is a 71 y.o. year old female who is a primary care patient of Renee Rival, FNP. The CCM team was consulted to assist the patient with chronic disease management and/or care coordination needs related to: [employer] .   Engaged with patient by telephone for initial visit in response to provider referral for social work chronic care management and care coordination services.   Consent to Services:  The patient was given the following information about Chronic Care Management services today, agreed to services, and gave verbal consent: 1. CCM service includes personalized support from designated clinical staff supervised by the primary care provider, including individualized plan of care and coordination with other care providers 2. 24/7 contact phone numbers for assistance for urgent and routine care needs. 3. Service will only be billed when office clinical staff spend 20 minutes or more in a month to coordinate care. 4. Only one practitioner may furnish and bill the service in a calendar month. 5.The patient may stop CCM services at any time (effective at the end of the month) by phone call to the office staff. 6. The patient will be responsible for cost sharing (co-pay) of up to 20% of the service fee (after annual deductible is met). Patient agreed to services and consent obtained.  Patient agreed to services and consent obtained.   Assessment: Review of patient past medical history, allergies, medications, and health status, including review of relevant consultants reports was performed today as part of a comprehensive evaluation and provision of chronic care management and care coordination services.     SDOH (Social Determinants of Health) assessments and interventions performed:  SDOH Interventions    Flowsheet Row Most Recent Value   SDOH Interventions   Physical Activity Interventions Other (Comments)  [walking challenges. she uses a walker to help her walk]  Stress Interventions Provide Counseling  [client has stress related to managing her medical needs and manging needs of her spouse]  Depression Interventions/Treatment  Currently on Treatment, Counseling        Advanced Directives Status: See Vyn[employer] application for related entries.  CCM Care Plan  Allergies  Allergen Reactions   Amoxicillin-Pot Clavulanate Other (See Comments)   Biaxin [Clarithromycin] Other (See Comments)    Stomach problems    Erythromycin    Lisinopril Swelling    Outpatient Encounter Medications as of 06/25/2021  Medication Sig   acetaminophen (TYLENOL) 500 MG tablet Take 1,000 mg by mouth 2 (two) times daily as needed for moderate pain or headache.   albuterol (PROVENTIL HFA;VENTOLIN HFA) 108 (90 BASE) MCG/ACT inhaler Inhale 2 puffs into the lungs every 6 (six) hours as needed for shortness of breath.   amLODipine (NORVASC) 10 MG tablet Take 1 tablet (10 mg total) by mouth daily.   aspirin EC 81 MG tablet Take 81 mg by mouth at bedtime.   B Complex Vitamins (VITAMIN B COMPLEX) TABS Take 1 tablet by mouth daily.   busPIRone (BUSPAR) 5 MG tablet Take 5 mg by mouth daily.   Calcium Carbonate-Vitamin D (CALCIUM 600+D PO) Take 1 tablet by mouth 2 (two) times daily.   chlorthalidone (HYGROTON) 25 MG tablet Take 25 mg by mouth daily.    cholecalciferol (VITAMIN D) 25 MCG (1000 UNIT) tablet Take 1,000 Units by mouth daily.   fluticasone (FLONASE) 50 MCG/ACT nasal spray Place 2 sprays into both nostrils daily.  fluticasone-salmeterol (ADVAIR) 250-50 MCG/ACT AEPB Inhale 1 puff into the lungs 2 (two) times daily.   folic acid (FOLVITE) 1 MG tablet TAKE 1 TABLET BY MOUTH ONCE DAILY. (Patient taking differently: Take 1 mg by mouth daily.)   gabapentin (NEURONTIN) 600 MG tablet Take 600 mg by mouth 2 (two) times daily.   lidocaine (XYLOCAINE) 2 %  solution TAKE 2 TEASPOONFULS BEFORE MEALS AND AT BEDTIME AS NEEDED MAY REPEAT EVERY 4 HOURS. (MAX OF 8 DOSES PER DAY)   Lidocaine HCl (XOLIDO) 2 % CREA Apply 1 application topically daily as needed (arthritisis).   losartan (COZAAR) 100 MG tablet Take 100 mg by mouth daily.   metoprolol tartrate (LOPRESSOR) 50 MG tablet Take 1 tablet (50 mg total) by mouth 2 (two) times daily.   mirabegron ER (MYRBETRIQ) 25 MG TB24 tablet Take 1 tablet (25 mg total) by mouth daily.   mirtazapine (REMERON) 15 MG tablet Take 15 mg by mouth at bedtime. (Patient not taking: Reported on 06/16/2021)   montelukast (SINGULAIR) 10 MG tablet Take 10 mg by mouth daily.    nitroGLYCERIN (NITROSTAT) 0.4 MG SL tablet Place 0.4 mg under the tongue every 5 (five) minutes as needed for chest pain.   pantoprazole (PROTONIX) 40 MG tablet Take 40 mg by mouth daily.   potassium chloride (K-DUR,KLOR-CON) 10 MEQ tablet Take 10 mEq by mouth daily.   primidone (MYSOLINE) 50 MG tablet Take 50 mg by mouth daily.   Probiotic Product (PROBIOTIC PO) Take 1 capsule by mouth daily. [facility].   sertraline (ZOLOFT) 100 MG tablet Take 200 mg by mouth daily.    Simethicone (GAS-X PO) Take 2 tablets by mouth daily as needed (gas).   simvastatin (ZOCOR) 40 MG tablet Take 40 mg by mouth at bedtime.   vitamin B-12 (CYANOCOBALAMIN) 1000 MCG tablet Take 1,000 mcg by mouth daily.   vitamin C (ASCORBIC ACID) 500 MG tablet Take 500 mg by mouth daily.   warfarin (COUMADIN) 2.5 MG tablet Take 2.5 mg by mouth daily.   No facility-administered encounter medications on file as of 06/25/2021.    Patient Active Problem List   Diagnosis Date Noted   Anxiety and depression 06/18/2021   Fatigue 06/04/2021   Abnormal gait 05/06/2021   Amnesia 05/06/2021   Anemia due to chronic blood loss 05/06/2021   Asthma 05/06/2021   Atherosclerotic heart disease of native coronary artery without angina pectoris 05/06/2021   Cardiac pacemaker in situ 05/06/2021    Chronic pain 05/06/2021   Coagulation disorder ([place]) 05/06/2021   Panic disorder 05/06/2021   H/O: hysterectomy 05/06/2021   IgA myeloma ([place]) 05/06/2021   Long term (current) use of anticoagulants 05/06/2021   Major depressive disorder, single episode, unspecified 05/06/2021   Mild intermittent asthma 05/06/2021   Mixed hyperlipidemia 05/06/2021   Monoclonal gammopathy 05/06/2021   Obesity 05/06/2021   Personal history of pulmonary embolism 05/06/2021   Recurrent major depression in remission ([place]) 05/06/2021   Severe recurrent major depression without psychotic features ([place]) 05/06/2021   Skin sensation disturbance 05/06/2021   Unspecified mononeuropathy of left upper limb 05/06/2021   Urinary incontinence 05/06/2021   Tremors of nervous system 04/13/2021   Left hip pain 04/13/2021   Allergic rhinitis 03/22/2021   Allergic sinusitis 03/22/2021   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   Abdominal pain 02/02/2021   Buttock pain 10/01/2020   Bilateral knee pain 07/15/2020   Immunization due 07/15/2020   Encounter for subsequent annual wellness visit (AWV) in Medicare patient 07/15/2020  Easy bruising 04/30/2020   Wedge compression fracture of first lumbar vertebra, subsequent encounter for fracture with routine healing 04/06/2020   Trapezius muscle spasm 02/27/2020   Body mass index (BMI) 28.0-28.9, adult 01/14/2020   DDD (degenerative disc disease), cervical 01/02/2020   Hoarseness of voice 11/27/2019   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 manage daily needs of client and her spouse. manage anxiety issues   Start Date: 06/25/2021  Expected End Date: 09/21/2021  This Visit's Progress: On track  Priority: Medium  Note:   Current Barriers:  Anxiety issues Mobility issues Caregiver stress issues Suicidal Ideation/Homicidal Ideation: No  Clinical Social Work Goal(s):  patient will work with SW monthly by telephone or in person to reduce or manage symptoms related to anxiety and anxiety issues Patient will talk with SW monthly about caregiver stress issues Patient will attend all scheduled medical appointments for client in next 30 days  Interventions: Patient interviewed and appropriate assessments performed: PHQ 2/9.  GAD-7 1:1 collaboration with Renee Rival, FNP regarding development and update of comprehensive plan of care as evidenced by provider attestation and co-signature Discussed with Joycelyn Schmid her current needs Discussed with Chani the needs of her spouse.  She said he needs help with bathing and some daily tasks. Jeannine is waiting on call from [facility] to discuss needs of her spouse in the home Reviewed medication procurement of client Reviewed transportation issues. She said she drives car as needed to medical appointments and to complete errands Reviewed client appetite. Reviewed sleeping issues of client Discussed CCM program support with RNCM and LCSW Reviewed family support. Amana said that her sister resides with her and her spouse. Client said that her sister is very helpful. Sister helps with cooking, cleaning and getting groceries Reviewed mood of client. She said her mood is stable. She thinks that she may need more help in the home to care for her spouse Reviewed client appointments. She has appointment in February at office of Gastroenterologist. Provided counseling support for client Client  discussed recent tests she has had. Discussed medical care for client.  She said she is pleased with her current medical care.  Patient Self Care Activities:  Attends all scheduled provider appointments Performs ADL's independently  Patient Coping Strengths:  Family Friends  Patient Self Care Deficits:  Walking challenges Caregiver stress issues Sleeping challenges  Patient Goals:  - spend time or talk with others at least 2 to 3 times per week - practice relaxation or meditation daily - keep a calendar with appointment dates  Follow Up Plan: LCSW to call client on 07/23/21 at 1:00 PM    Norva Riffle.Aaran Enberg MSW, [place] Holiday representative [name] Care Management [phone]

## 2021-06-30 NOTE — Progress Notes (Signed)
Will review at 2/21 OV

## 2021-07-09 ENCOUNTER — Other Ambulatory Visit: Payer: Self-pay | Admitting: Radiology

## 2021-07-09 ENCOUNTER — Other Ambulatory Visit: Payer: Self-pay | Admitting: Student

## 2021-07-12 ENCOUNTER — Ambulatory Visit (HOSPITAL_COMMUNITY)
Admission: RE | Admit: 2021-07-12 | Discharge: 2021-07-12 | Disposition: A | Discharge status: Home / Self Care | Payer: Medicare Other | Source: Ambulatory Visit | Attending: Hematology | Admitting: Hematology

## 2021-07-12 ENCOUNTER — Encounter (HOSPITAL_COMMUNITY): Payer: Self-pay

## 2021-07-12 ENCOUNTER — Other Ambulatory Visit: Payer: Self-pay

## 2021-07-12 DIAGNOSIS — D539 Nutritional anemia, unspecified: Secondary | ICD-10-CM | POA: Diagnosis not present

## 2021-07-12 DIAGNOSIS — C9000 Multiple myeloma not having achieved remission: Secondary | ICD-10-CM | POA: Diagnosis not present

## 2021-07-12 DIAGNOSIS — Q998 Other specified chromosome abnormalities: Secondary | ICD-10-CM | POA: Insufficient documentation

## 2021-07-12 DIAGNOSIS — E785 Hyperlipidemia, unspecified: Secondary | ICD-10-CM | POA: Insufficient documentation

## 2021-07-12 DIAGNOSIS — I2510 Atherosclerotic heart disease of native coronary artery without angina pectoris: Secondary | ICD-10-CM | POA: Insufficient documentation

## 2021-07-12 DIAGNOSIS — Z950 Presence of cardiac pacemaker: Secondary | ICD-10-CM | POA: Insufficient documentation

## 2021-07-12 DIAGNOSIS — I10 Essential (primary) hypertension: Secondary | ICD-10-CM | POA: Insufficient documentation

## 2021-07-12 DIAGNOSIS — K589 Irritable bowel syndrome without diarrhea: Secondary | ICD-10-CM | POA: Insufficient documentation

## 2021-07-12 DIAGNOSIS — D696 Thrombocytopenia, unspecified: Secondary | ICD-10-CM | POA: Diagnosis not present

## 2021-07-12 DIAGNOSIS — Z86711 Personal history of pulmonary embolism: Secondary | ICD-10-CM | POA: Insufficient documentation

## 2021-07-12 DIAGNOSIS — G4730 Sleep apnea, unspecified: Secondary | ICD-10-CM | POA: Insufficient documentation

## 2021-07-12 DIAGNOSIS — I252 Old myocardial infarction: Secondary | ICD-10-CM | POA: Diagnosis not present

## 2021-07-12 DIAGNOSIS — D472 Monoclonal gammopathy: Secondary | ICD-10-CM

## 2021-07-12 DIAGNOSIS — F419 Anxiety disorder, unspecified: Secondary | ICD-10-CM | POA: Insufficient documentation

## 2021-07-12 LAB — CBC WITH DIFFERENTIAL/PLATELET
Abs Immature Granulocytes: 0.08 10*3/uL — ABNORMAL HIGH (ref 0.00–0.07)
Basophils Absolute: 0.1 10*3/uL (ref 0.0–0.1)
Basophils Relative: 1 %
Eosinophils Absolute: 0.2 10*3/uL (ref 0.0–0.5)
Eosinophils Relative: 3 %
HCT: 29.3 % — ABNORMAL LOW (ref 36.0–46.0)
Hemoglobin: 9.3 g/dL — ABNORMAL LOW (ref 12.0–15.0)
Immature Granulocytes: 1 %
Lymphocytes Relative: 23 %
Lymphs Abs: 1.7 10*3/uL (ref 0.7–4.0)
MCH: 32.2 pg (ref 26.0–34.0)
MCHC: 31.7 g/dL (ref 30.0–36.0)
MCV: 101.4 fL — ABNORMAL HIGH (ref 80.0–100.0)
Monocytes Absolute: 0.7 10*3/uL (ref 0.1–1.0)
Monocytes Relative: 9 %
Neutro Abs: 4.7 10*3/uL (ref 1.7–7.7)
Neutrophils Relative %: 63 %
Platelets: 134 10*3/uL — ABNORMAL LOW (ref 150–400)
RBC: 2.89 MIL/uL — ABNORMAL LOW (ref 3.87–5.11)
RDW: 16.9 % — ABNORMAL HIGH (ref 11.5–15.5)
WBC: 7.4 10*3/uL (ref 4.0–10.5)
nRBC: 0 % (ref 0.0–0.2)

## 2021-07-12 MED ORDER — FENTANYL CITRATE (PF) 100 MCG/2ML IJ SOLN
INTRAMUSCULAR | Status: AC
  Filled 2021-07-12: qty 2

## 2021-07-12 MED ORDER — LIDOCAINE HCL (PF) 1 % IJ SOLN
INTRAMUSCULAR | Status: DC | PRN
  Administered 2021-07-12: 10 mL via INTRADERMAL

## 2021-07-12 MED ORDER — SODIUM CHLORIDE 0.9 % IV SOLN: INTRAVENOUS | Status: DC

## 2021-07-12 MED ORDER — FENTANYL CITRATE (PF) 100 MCG/2ML IJ SOLN
INTRAMUSCULAR | Status: DC | PRN
  Administered 2021-07-12: 50 ug via INTRAVENOUS

## 2021-07-12 NOTE — Procedures (Signed)
Interventional Radiology Procedure Note  Indication: Smoldering Multiple Myeloma  Procedure: CT guided aspirate and core biopsy of right iliac bone  Complications: None  Bleeding: Minimal  Miachel Roux, MD [phone]

## 2021-07-12 NOTE — Consult Note (Signed)
Chief Complaint: Patient was seen in consultation today for CT-guided bone marrow biopsy  Referring Physician(s): [place]  Supervising Physician: Mir, Sharen Heck  Patient Status: [facility] OP  History of Present Illness: Courtney Rogers is a 71 y.o. 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  She presents today for CT-guided bone marrow biopsy for further evaluation.  Past Medical History:  Diagnosis Date   Acute myocardial infarction [facility]) 2009   CAD/no stent medically managed   Anxiety disorder    CAD (coronary artery disease)    Hyperlipidemia    Hypertension    IBS (irritable bowel syndrome)    Non-ulcer dyspepsia 04/30/2009   Qualifier: Diagnosis of  By: Oneida Alar MD, Sandi L    Overactive bladder    PE (pulmonary embolism) JUN 2014   Presence of permanent cardiac pacemaker    Sleep apnea     Past Surgical History:  Procedure Laterality Date   ABDOMINAL HYSTERECTOMY     BSO secondary to cyst     CARDIAC CATHETERIZATION     COLONOSCOPY  12/2009   Dr. West Carbo, propofol, normal. Next TCS 12/2019   COLONOSCOPY N/A 05/22/2017   Procedure: COLONOSCOPY;  Surgeon: Danie Binder, MD;  Location[facility];  Service: Endoscopy;  Laterality: N/A;  10:30am   ESOPHAGOGASTRODUODENOSCOPY  05/11/2009   schatzki ring/small hiatal hernia/path:gastritis   ESOPHAGOGASTRODUODENOSCOPY N/A 05/22/2017   Procedure: ESOPHAGOGASTRODUODENOSCOPY (EGD);  Surgeon: Danie Binder, MD;  Location: [facility];  Service: Endoscopy;  Laterality: N/A;   ESOPHAGOGASTRODUODENOSCOPY (EGD) WITH ESOPHAGEAL DILATION N/A 04/01/2013   [id number] at the gastroesophageal juction/multiple small polyps/mild gastritis   GIVENS CAPSULE STUDY N/A 06/07/2017   Procedure: GIVENS CAPSULE STUDY;   Surgeon: Danie Binder, MD;  Location: [facility];  Service: Endoscopy;  Laterality: N/A;  7:30am   INSERT / REPLACE / REMOVE PACEMAKER     Last year per pt.(can't remember date)   REPLACEMENT TOTAL KNEE Right 08/2020    Allergies: Amoxicillin-pot clavulanate, Biaxin [clarithromycin], Erythromycin, and Lisinopril  Medications: Prior to Admission medications   Medication Sig Start Date End Date Taking? Authorizing Provider  acetaminophen (TYLENOL) 500 MG tablet Take 1,000 mg by mouth 2 (two) times daily as needed for moderate pain or headache.   Yes [provider]  amLODipine (NORVASC) 10 MG tablet Take 1 tablet (10 mg total) by mouth daily. 07/15/20  Yes Noreene Larsson, NP  aspirin EC 81 MG tablet Take 81 mg by mouth at bedtime.   Yes [provider]  B Complex Vitamins (VITAMIN B COMPLEX) TABS Take 1 tablet by mouth daily.   Yes [provider]  busPIRone (BUSPAR) 5 MG tablet Take 5 mg by mouth daily. 11/25/16  Yes [provider]  Calcium Carbonate-Vitamin D (CALCIUM 600+D PO) Take 1 tablet by mouth 2 (two) times daily.   Yes [provider]  chlorthalidone (HYGROTON) 25 MG tablet Take 25 mg by mouth daily.    Yes [provider]  cholecalciferol (VITAMIN D) 25 MCG (1000 UNIT) tablet Take 1,000 Units by mouth daily. 10/15/20  Yes [provider]  fluticasone (FLONASE) 50 MCG/ACT nasal spray Place 2 sprays into both nostrils daily. 03/18/21  Yes Fayrene Helper, MD  gabapentin (NEURONTIN) 600 MG tablet Take 600 mg by mouth 2 (two) times  daily. 05/07/19  Yes [provider]  losartan (COZAAR) 100 MG tablet Take 100 mg by mouth daily. 01/16/21  Yes [provider]  metoprolol tartrate (LOPRESSOR) 50 MG tablet Take 1 tablet (50 mg total) by mouth 2 (two) times daily. 06/18/21  Yes [place], Dewaine Conger, FNP  mirabegron ER (MYRBETRIQ) 25 MG TB24 tablet Take 1 tablet (25 mg total) by mouth daily. 02/22/21  Yes Noreene Larsson, NP  montelukast (SINGULAIR) 10 MG tablet Take 10 mg by mouth daily.    Yes [provider]  pantoprazole (PROTONIX) 40 MG tablet Take 40 mg by mouth daily. 08/10/20  Yes [provider]  potassium chloride (K-DUR,KLOR-CON) 10 MEQ tablet Take 10 mEq by mouth daily.   Yes [provider]  primidone (MYSOLINE) 50 MG tablet Take 50 mg by mouth daily. 03/31/21  Yes [provider]  Probiotic Product (PROBIOTIC PO) Take 1 capsule by mouth daily. [facility].   Yes [provider]  sertraline (ZOLOFT) 100 MG tablet Take 200 mg by mouth daily.    Yes [provider]  simvastatin (ZOCOR) 40 MG tablet Take 40 mg by mouth at bedtime.   Yes [provider]  vitamin B-12 (CYANOCOBALAMIN) 1000 MCG tablet Take 1,000 mcg by mouth daily. 10/12/20  Yes [provider]  vitamin C (ASCORBIC ACID) 500 MG tablet Take 500 mg by mouth daily.   Yes [provider]  warfarin (COUMADIN) 2.5 MG tablet Take 2.5 mg by mouth daily.   Yes [provider]  albuterol (PROVENTIL HFA;VENTOLIN HFA) 108 (90 BASE) MCG/ACT inhaler Inhale 2 puffs into the lungs every 6 (six) hours as needed for shortness of breath.    [provider]  fluticasone-salmeterol (ADVAIR) 250-50 MCG/ACT AEPB Inhale 1 puff into the lungs 2 (two) times daily. 10/12/20   [provider]  folic acid (FOLVITE) 1 MG tablet TAKE 1 TABLET BY MOUTH ONCE DAILY. Patient taking differently: Take 1 mg by mouth daily. 02/09/21   Derek Jack, MD  lidocaine (XYLOCAINE) 2 % solution TAKE 2 TEASPOONFULS BEFORE MEALS AND AT BEDTIME AS NEEDED MAY REPEAT EVERY 4 HOURS. (MAX OF 8 DOSES PER DAY) 06/02/21   Mahala Menghini, PA-C  Lidocaine HCl (XOLIDO) 2 % CREA Apply 1 application topically daily as needed (arthritisis).    [provider]  mirtazapine (REMERON) 15 MG tablet Take 15 mg by mouth at bedtime. Patient not taking: Reported on 06/16/2021  09/18/20   [provider]  nitroGLYCERIN (NITROSTAT) 0.4 MG SL tablet Place 0.4 mg under the tongue every 5 (five) minutes as needed for chest pain.    [provider]  Simethicone (GAS-X PO) Take 2 tablets by mouth daily as needed (gas).    [provider]     Family History  Problem Relation Age of Onset   Colon polyps Neg Hx    Colon cancer Neg Hx     Social History   Socioeconomic History   Marital status: Married    Spouse name: [name]   Number of children: 3   Years of education: 16   Highest education level: Not on file  Occupational History   Not on file  Tobacco Use   Smoking status: Never   Smokeless tobacco: Never   Tobacco comments:    Never smoked   Vaping Use   Vaping Use: Never used  Substance and Sexual Activity   Alcohol use: Yes    Alcohol/week: 0.0 standard drinks  Comment: occ wine   Drug use: No   Sexual activity: Not Currently  Other Topics Concern   Not on file  Social History Narrative      Lives with husband-51 years 11 in Aug 2021   Daughter is close by, 2 sons live further away   One IN [place],ONE IN [place], [place].        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   Enjoys: reading, young adult      Diet: eats all food groups    Caffeine: coffee 1, tea daily soda-1 daily   Water: 1-2 cups      Wears seat belt    Does not use phone while driving   Oceanographer at home    Public house manager -locked up      Left handed   One story home   Drinks caffeine   Social Determinants of Health   Financial Resource Strain: Low Risk    Difficulty of Paying Living Expenses: Not hard at all  Food Insecurity: No Food Insecurity   Worried About Charity fundraiser in the Last Year: Never true   Arboriculturist in the Last Year: Never true  Transportation Needs: No Transportation Needs   Lack of Transportation (Medical): No   Lack of Transportation (Non-Medical): No  Physical  Activity: Inactive   Days of Exercise per Week: 0 days   Minutes of Exercise per Session: 0 min  Stress: Stress Concern Present   Feeling of Stress : Rather much  Social Connections: Moderately Integrated   Frequency of Communication with Friends and Family: Once a week   Frequency of Social Gatherings with Friends and Family: Once a week   Attends Religious Services: More than 4 times per year   Active Member of [employer] or Organizations: Yes   Attends Music therapist: More than 4 times per year   Marital Status: Married      Review of Systems denies fever, headache, chest pain, worsening dyspnea, cough, abdominal/back pain, nausea, vomiting or bleeding.  She does have some fatigue.  Vital Signs: BP 131/74    Pulse 78    Temp 98.3 F (36.8 C) (Oral)    Resp 16    SpO2 98%   Physical Exam awake, alert.  Chest with slightly diminished breath sounds at bases.  Left chest wall pacer.  Heart with regular rate and rhythm.  Abdomen soft, positive bowel sounds, nontender.  Mild pretibial edema bilaterally.  Imaging: NM PET Image Restag (PS) Skull Base To Thigh  Result Date: 06/30/2021 CLINICAL DATA:  Subsequent treatment strategy for myeloma. EXAM: NUCLEAR MEDICINE PET SKULL VERTEX THROUGH MID THIGHS. TECHNIQUE: 8.9 mCi F-18 FDG was injected intravenously. Full-ring PET imaging was performed from the head to the mid thigh region after the radiotracer. CT data was obtained and used for attenuation correction and anatomic localization. Fasting blood glucose: 106 mg/dl COMPARISON:  Multiple exams, including PET-CT 04/07/2020 FINDINGS: Mediastinal blood pool activity: SUV max 2.4 HEAD/NECK: No significant abnormal hypermetabolic activity in this region. Anterior tongue activity is likely physiologic. Incidental CT findings: none CHEST: No significant abnormal hypermetabolic activity in this region. Incidental CT findings: Dual lead pacer device. Prominent main pulmonary artery. Mild  cardiomegaly. Bandlike opacities in the lower lobes similar to 03/01/2021 favoring scarring. Punctate high densities peripherally in the right lower lobe on image 56 of series 7 are stable in could represent calcifications, remotely aspirated barium, a likely inconsequential  amount of embolized methacrylate. ABDOMEN/PELVIS: Photopenic right renal cyst. Incidental CT findings: The spleen measures 11.8 by 6.0 by 8.6 cm (volume = 320 cm^3), within normal limits. Right kidney lower pole nonobstructive nephrolithiasis. SKELETON: Multiple new hypermetabolic skeletal lesions with involvement of the left anterior iliac crest, left mid humerus, right superior pubic ramus, bilateral mid femurs, and left calvarium. The largest lesion is the lytic lesion anteriorly in the left iliac crest measuring 2.9 by 2.3 cm on image 221 series 3, maximum SUV 15.4. The more distal of two foci in the left humeral diaphysis maximum SUV of 4.9. A new lucent lesion in the left parietal calvarium measures 0.6 by 0.5 cm on image 31 of series 3, maximum SUV 5.7. Incidental CT findings: none IMPRESSION: 1. Progressive myeloma, with several scattered hypermetabolic skeletal lesions including the bony pelvis, left mid humerus, bilateral mid femurs, right superior pubic ramus, and left calvarium. 2. Prominent main pulmonary artery, cannot exclude pulmonary arterial hypertension. Mild cardiomegaly. 3. Stable bibasilar scarring. 4. Nonobstructive right nephrolithiasis. Electronically Signed   By: Van Clines M.D.   On: 06/30/2021 10:20    Labs:  CBC: Recent Labs    04/07/21 2140 04/16/21 0948 06/04/21 1354 06/09/21 1321  WBC 8.2 5.9 7.2 7.0  HGB 10.0* 9.6* 11.0* 10.8*  HCT 30.2* 30.5* 31.5* 32.8*  PLT 111* 105* 137* 120*    COAGS: Recent Labs    04/07/21 2140  INR 1.5*    BMP: Recent Labs    07/15/20 1113 08/12/20 1008 01/21/21 1018 02/25/21 1314 04/07/21 2140 04/16/21 0948 06/04/21 1354 06/09/21 1321  NA 141    < > 136   < > 138 142 137 136  K 3.9   < > 3.9   < > 3.2* 3.4* 3.8 3.6  CL 100   < > 103   < > 102 104 95* 100  CO2 24   < > 28   < > '25 25 24 26  ' GLUCOSE 86   < > 88   < > 91 95 94 102*  BUN 14   < > 14   < > '18 20 22 18  ' CALCIUM 9.5   < > 8.9   < > 9.3 9.3 9.6 9.1  CREATININE 0.85   < > 0.84   < > 0.73 1.00 1.02* 1.05*  GFRNONAA 69   < > >60  --  >60 60*  --  56*  GFRAA 80  --   --   --   --   --   --   --    < > = values in this interval not displayed.    LIVER FUNCTION TESTS: Recent Labs    02/25/21 1314 04/07/21 2140 04/16/21 0948 06/09/21 1321  BILITOT 0.3 0.6 0.5 0.6  AST '19 24 25 23  ' ALT '6 16 15 13  ' ALKPHOS 89 71 69 48  PROT 10.3* 10.1* 9.9* 10.2*  ALBUMIN 3.9 3.4* 3.4* 3.8    TUMOR MARKERS: No results for input(s): AFPTM, CEA, CA199, CHROMGRNA in the last 8760 hours.  Assessment and Plan: 71 y.o. 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  She presents today for CT-guided bone marrow biopsy for further evaluation.Risks and benefits of procedure was discussed with the patient including, but not limited to bleeding, infection, damage to adjacent structures  or low yield requiring additional tests.  All of the questions were answered and there is agreement to proceed.  Consent signed and in chart.    Thank you for this interesting consult.  I greatly enjoyed meeting Courtney Rogers and look forward to participating in their care.  A copy of this report was sent to the requesting provider on this date.  Electronically Signed: D. Rowe Robert, PA-C 07/12/2021, 8:31 AM   I spent a total of  20 minutes   in face to face in clinical consultation, greater than 50% of which was counseling/coordinating care for CT-guided bone marrow biopsy

## 2021-07-13 ENCOUNTER — Ambulatory Visit (HOSPITAL_COMMUNITY): Payer: BC Managed Care – PPO | Admitting: Hematology

## 2021-07-14 LAB — SURGICAL PATHOLOGY

## 2021-07-19 ENCOUNTER — Encounter (HOSPITAL_COMMUNITY): Payer: Self-pay | Admitting: Hematology

## 2021-07-20 DIAGNOSIS — F32A Depression, unspecified: Secondary | ICD-10-CM

## 2021-07-22 ENCOUNTER — Other Ambulatory Visit: Payer: Self-pay

## 2021-07-22 ENCOUNTER — Inpatient Hospital Stay (HOSPITAL_COMMUNITY): Payer: Medicare Other | Attending: Hematology | Admitting: Hematology

## 2021-07-22 ENCOUNTER — Inpatient Hospital Stay (HOSPITAL_COMMUNITY): Payer: Medicare Other

## 2021-07-22 VITALS — BP 130/73 | HR 75 | Temp 97.6°F | Resp 18 | Ht 64.0 in | Wt 169.5 lb

## 2021-07-22 DIAGNOSIS — Z7961 Long term (current) use of immunomodulator: Secondary | ICD-10-CM | POA: Diagnosis not present

## 2021-07-22 DIAGNOSIS — Z86711 Personal history of pulmonary embolism: Secondary | ICD-10-CM | POA: Diagnosis not present

## 2021-07-22 DIAGNOSIS — D472 Monoclonal gammopathy: Secondary | ICD-10-CM | POA: Diagnosis not present

## 2021-07-22 DIAGNOSIS — Z7951 Long term (current) use of inhaled steroids: Secondary | ICD-10-CM | POA: Insufficient documentation

## 2021-07-22 DIAGNOSIS — I10 Essential (primary) hypertension: Secondary | ICD-10-CM | POA: Diagnosis not present

## 2021-07-22 DIAGNOSIS — C9000 Multiple myeloma not having achieved remission: Secondary | ICD-10-CM | POA: Diagnosis not present

## 2021-07-22 DIAGNOSIS — Z7982 Long term (current) use of aspirin: Secondary | ICD-10-CM | POA: Diagnosis not present

## 2021-07-22 DIAGNOSIS — D649 Anemia, unspecified: Secondary | ICD-10-CM | POA: Diagnosis not present

## 2021-07-22 DIAGNOSIS — Z5112 Encounter for antineoplastic immunotherapy: Secondary | ICD-10-CM | POA: Insufficient documentation

## 2021-07-22 DIAGNOSIS — Z79899 Other long term (current) drug therapy: Secondary | ICD-10-CM | POA: Insufficient documentation

## 2021-07-22 DIAGNOSIS — Z7901 Long term (current) use of anticoagulants: Secondary | ICD-10-CM | POA: Insufficient documentation

## 2021-07-22 LAB — COMPREHENSIVE METABOLIC PANEL
ALT: 14 U/L (ref 0–44)
AST: 23 U/L (ref 15–41)
Albumin: 3.7 g/dL (ref 3.5–5.0)
Alkaline Phosphatase: 66 U/L (ref 38–126)
Anion gap: 12 (ref 5–15)
BUN: 19 mg/dL (ref 8–23)
CO2: 26 mmol/L (ref 22–32)
Calcium: 9.5 mg/dL (ref 8.9–10.3)
Chloride: 102 mmol/L (ref 98–111)
Creatinine, Ser: 0.82 mg/dL (ref 0.44–1.00)
GFR, Estimated: >60 mL/min (ref >60)
Glucose, Bld: 99 mg/dL (ref 70–99)
Potassium: 3.8 mmol/L (ref 3.5–5.1)
Sodium: 140 mmol/L (ref 135–145)
Total Bilirubin: 0.6 mg/dL (ref 0.3–1.2)
Total Protein: 10.9 g/dL — ABNORMAL HIGH (ref 6.5–8.1)

## 2021-07-22 LAB — LACTATE DEHYDROGENASE: LDH: 167 U/L (ref 98–192)

## 2021-07-22 NOTE — Patient Instructions (Addendum)
[place] at [facility] ?Discharge Instructions ? ? ?You were seen and examined today by Dr. Delton Coombes. ? ?He reviewed the results of your PET scan and bone marrow biopsy. 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 It is in your bones. This disease will require treatment for the rest of your life.  It is not curable, but it is very treatable/controllable. There is chromosome testing results from the bone marrow biopsy will determine the type of treatment we use.  Those results are pending at this time, but should be back next week.  ? ?We will also start you on an injection called Xgeva.  This is to help strengthen your bones.  ? ?Return as scheduled to discuss treatment options.  ? ? ? ? ?Thank you for choosing [place] [facility] to provide your oncology and hematology care.  To afford each patient quality time with our provider, please arrive at least 15 minutes before your scheduled appointment time.  ? ?If you have a lab appointment with the [facility] please come in thru the Main Entrance and check in at the main information desk. ? ?You need to re-schedule your appointment should you arrive 10 or more minutes late.  We strive to give you quality time with our providers, and arriving late affects you and other patients whose appointments are after yours.  Also, if you no show three or more times for appointments you may be dismissed from the clinic at the providers discretion.     ?Again, thank you for choosing [facility].  Our hope is that these requests will decrease the amount of time that you wait before being seen by our physicians.       ?_____________________________________________________________ ? ?Should you have questions after your visit to [facility], please contact our office at [phone] and follow the prompts.  Our  office hours are 8:00 a.m. and 4:30 p.m. Monday - Friday.  Please note that voicemails left after 4:00 p.m. may not be returned until the following business day.  We are closed weekends and major holidays.  You do have access to a nurse 24-7, just call the main number to the clinic [phone] and do not press any options, hold on the line and a nurse will answer the phone.   ? ?For prescription refill requests, have your pharmacy contact our office and allow 72 hours.   ? ?Due to Covid, you will need to wear a mask upon entering the hospital. If you do not have a mask, a mask will be given to you at the Main Entrance upon arrival. For doctor visits, patients may have 1 support person age 37 or older with them. For treatment visits, patients can not have anyone with them due to social distancing guidelines and our immunocompromised population.  ? ?   ?

## 2021-07-22 NOTE — Progress Notes (Signed)
[place] Courtney Rogers, Courtney Rogers   CLINIC:  Medical Oncology/Hematology  PCP:  Renee Rival, FNP [address] / [place] [address]  [phone]  REASON FOR VISIT:  Follow-up for multiple myeloma  PRIOR THERAPY: none  CURRENT THERAPY: surveillance  INTERVAL HISTORY:  Courtney Rogers, a 71 y.o. female, returns for routine follow-up for her multiple myeloma. Courtney Rogers was last seen on 06/16/2021.  Today she reports feeling good. She denies tingling/numbness and new pains. She uses a walker when walking outside of her home. She reports stable chronic back pain. She also reports left pelvic pain. She reports mild fatigue. She denies recent infections. She denies orthopnea.   REVIEW OF SYSTEMS:  Review of Systems  Constitutional:  Positive for fatigue. Negative for appetite change.  Genitourinary:  Positive for frequency and pelvic pain (L).   Musculoskeletal:  Positive for back pain (4/10).  All other systems reviewed and are negative.  PAST MEDICAL/SURGICAL HISTORY:  Past Medical History:  Diagnosis Date   Acute myocardial infarction [facility]) 2009   CAD/no stent medically managed   Anxiety disorder    CAD (coronary artery disease)    Hyperlipidemia    Hypertension    IBS (irritable bowel syndrome)    Non-ulcer dyspepsia 04/30/2009   Qualifier: Diagnosis of  By: Oneida Alar MD, Sandi L    Overactive bladder    PE (pulmonary embolism) JUN 2014   Presence of permanent cardiac pacemaker    Sleep apnea    Past Surgical History:  Procedure Laterality Date   ABDOMINAL HYSTERECTOMY     BSO secondary to cyst     CARDIAC CATHETERIZATION     COLONOSCOPY  12/2009   Dr. West Carbo, propofol, normal. Next TCS 12/2019   COLONOSCOPY N/A 05/22/2017   Procedure: COLONOSCOPY;  Surgeon: Danie Binder, MD;  Location: [facility];  Service: Endoscopy;  Laterality: N/A;  10:30am   ESOPHAGOGASTRODUODENOSCOPY  05/11/2009    schatzki ring/small hiatal hernia/path:gastritis   ESOPHAGOGASTRODUODENOSCOPY N/A 05/22/2017   Procedure: ESOPHAGOGASTRODUODENOSCOPY (EGD);  Surgeon: Danie Binder, MD;  Location: [facility];  Service: Endoscopy;  Laterality: N/A;   ESOPHAGOGASTRODUODENOSCOPY (EGD) WITH ESOPHAGEAL DILATION N/A 04/01/2013   [id number] at the gastroesophageal juction/multiple small polyps/mild gastritis   GIVENS CAPSULE STUDY N/A 06/07/2017   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    SOCIAL HISTORY:  Social History   Socioeconomic History   Marital status: Married    Spouse name: Games developer   Number of children: 3   Years of education: 16   Highest education level: Not on file  Occupational History   Not on file  Tobacco Use   Smoking status: Never   Smokeless tobacco: Never   Tobacco comments:    Never smoked   Vaping Use   Vaping Use: Never used  Substance and Sexual Activity   Alcohol use: Yes    Alcohol/week: 0.0 standard drinks    Comment: occ wine   Drug use: No   Sexual activity: Not Currently  Other Topics Concern   Not on file  Social History Narrative      Lives with husband-51 years 952 in Aug 2021   Daughter is close by, 2  sons live further away   One IN [place],ONE IN [place], ONE BESIDE HER.        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   Enjoys: reading, young adult      Diet: eats all food groups    Caffeine: coffee 1, tea daily soda-1 daily   Water: 1-2 cups      Wears seat belt    Does not use phone while driving   Oceanographer at home    Public house manager -locked up      Left handed   One story home   Drinks caffeine   Social Determinants of Health   Financial Resource Strain: Low Risk    Difficulty of Paying  Living Expenses: Not hard at all  Food Insecurity: No Food Insecurity   Worried About Charity fundraiser in the Last Year: Never true   Arboriculturist in the Last Year: Never true  Transportation Needs: No Transportation Needs   Lack of Transportation (Medical): No   Lack of Transportation (Non-Medical): No  Physical Activity: Inactive   Days of Exercise per Week: 0 days   Minutes of Exercise per Session: 0 min  Stress: Stress Concern Present   Feeling of Stress : Rather much  Social Connections: Moderately Integrated   Frequency of Communication with Friends and Family: Once a week   Frequency of Social Gatherings with Friends and Family: Once a week   Attends Religious Services: More than 4 times per year   Active Member of [employer] or Organizations: Yes   Attends Music therapist: More than 4 times per year   Marital Status: Married  Human resources officer Violence: Not At Risk   Fear of Current or Ex-Partner: No   Emotionally Abused: No   Physically Abused: No   Sexually Abused: No    FAMILY HISTORY:  Family History  Problem Relation Age of Onset   Colon polyps Neg Hx    Colon cancer Neg Hx     CURRENT MEDICATIONS:  Current Outpatient Medications  Medication Sig Dispense Refill   acetaminophen (TYLENOL) 500 MG tablet Take 1,000 mg by mouth 2 (two) times daily as needed for moderate pain or headache.     albuterol (PROVENTIL HFA;VENTOLIN HFA) 108 (90 BASE) MCG/ACT inhaler Inhale 2 puffs into the lungs every 6 (six) hours as needed for shortness of breath.     amLODipine (NORVASC) 10 MG tablet Take 1 tablet (10 mg total) by mouth daily. 90 tablet 1   aspirin EC 81 MG tablet Take 81 mg by mouth at bedtime.     B Complex Vitamins (VITAMIN B COMPLEX) TABS Take 1 tablet by mouth daily.     busPIRone (BUSPAR) 5 MG tablet Take 5 mg by mouth daily.     Calcium Carbonate-Vitamin D (CALCIUM 600+D PO) Take 1 tablet by mouth 2 (two) times daily.     chlorthalidone (HYGROTON)  25 MG tablet Take 25 mg by mouth daily.      cholecalciferol (VITAMIN D) 25 MCG (1000 UNIT) tablet Take 1,000 Units by mouth daily.     fluticasone (FLONASE) 50 MCG/ACT nasal spray Place 2 sprays into both nostrils daily. 16 g 6   fluticasone-salmeterol (ADVAIR) 250-50 MCG/ACT AEPB Inhale 1 puff into the lungs 2 (two) times daily.     folic acid (FOLVITE) 1 MG tablet TAKE 1 TABLET BY MOUTH ONCE DAILY. (Patient taking differently: Take 1 mg by mouth daily.) 30 tablet 11  gabapentin (NEURONTIN) 600 MG tablet Take 600 mg by mouth 2 (two) times daily.     lidocaine (XYLOCAINE) 2 % solution TAKE 2 TEASPOONFULS BEFORE MEALS AND AT BEDTIME AS NEEDED MAY REPEAT EVERY 4 HOURS. (MAX OF 8 DOSES PER DAY) 300 mL 0   Lidocaine HCl (XOLIDO) 2 % CREA Apply 1 application topically daily as needed (arthritisis).     losartan (COZAAR) 100 MG tablet Take 100 mg by mouth daily.     metoprolol tartrate (LOPRESSOR) 50 MG tablet Take 1 tablet (50 mg total) by mouth 2 (two) times daily. 60 tablet 3   mirabegron ER (MYRBETRIQ) 25 MG TB24 tablet Take 1 tablet (25 mg total) by mouth daily. 30 tablet 2   mirtazapine (REMERON) 15 MG tablet Take 15 mg by mouth at bedtime. (Patient not taking: Reported on 06/16/2021)     montelukast (SINGULAIR) 10 MG tablet Take 10 mg by mouth daily.      nitroGLYCERIN (NITROSTAT) 0.4 MG SL tablet Place 0.4 mg under the tongue every 5 (five) minutes as needed for chest pain.     pantoprazole (PROTONIX) 40 MG tablet Take 40 mg by mouth daily.     potassium chloride (K-DUR,KLOR-CON) 10 MEQ tablet Take 10 mEq by mouth daily.     primidone (MYSOLINE) 50 MG tablet Take 50 mg by mouth daily.     Probiotic Product (GNP PROBIOTIC COLON SUPPORT) CAPS Take 1 capsule by mouth daily.     Probiotic Product (PROBIOTIC PO) Take 1 capsule by mouth daily. [facility].     sertraline (ZOLOFT) 100 MG tablet Take 200 mg by mouth daily.      Simethicone (GAS-X PO) Take 2 tablets by mouth daily as needed  (gas).     simvastatin (ZOCOR) 40 MG tablet Take 40 mg by mouth at bedtime.     vitamin B-12 (CYANOCOBALAMIN) 1000 MCG tablet Take 1,000 mcg by mouth daily.     vitamin C (ASCORBIC ACID) 500 MG tablet Take 500 mg by mouth daily.     warfarin (COUMADIN) 3 MG tablet Take by mouth.     No current facility-administered medications for this visit.    ALLERGIES:  Allergies  Allergen Reactions   Amoxicillin-Pot Clavulanate Other (See Comments)   Biaxin [Clarithromycin] Other (See Comments)    Stomach problems    Erythromycin    Lisinopril Swelling    PHYSICAL EXAM:  Performance status (ECOG): 1 - Symptomatic but completely ambulatory  Vitals:   07/22/21 1142  BP: 130/73  Pulse: Courtney Rogers  Resp: 18  Temp: 97.6 F (36.4 C)  SpO2: 97%   Wt Readings from Last 3 Encounters:  07/22/21 169 lb 8 oz (76.9 kg)  06/18/21 165 lb 1.9 oz (74.9 kg)  06/16/21 163 lb 2.3 oz (74 kg)   Physical Exam Vitals reviewed.  Constitutional:      Appearance: Normal appearance.  Cardiovascular:     Rate and Rhythm: Normal rate and regular rhythm.     Pulses: Normal pulses.     Heart sounds: Normal heart sounds.  Pulmonary:     Effort: Pulmonary effort is normal.     Breath sounds: Normal breath sounds.  Neurological:     General: No focal deficit present.     Mental Status: She is alert and oriented to person, place, and time.  Psychiatric:        Mood and Affect: Mood normal.        Behavior: Behavior normal.    LABORATORY DATA:  I have reviewed the labs as listed.  CBC Latest Ref Rng & Units 07/12/2021 06/09/2021 06/04/2021  WBC 4.0 - 10.5 K/uL 7.4 7.0 7.2  Hemoglobin 12.0 - 15.0 g/dL 9.3(L) 10.8(L) 11.0(L)  Hematocrit 36.0 - 46.0 % 29.3(L) 32.8(L) 31.5(L)  Platelets 150 - 400 K/uL 134(L) 120(L) 137(L)   CMP Latest Ref Rng & Units 06/09/2021 06/04/2021 04/16/2021  Glucose 70 - 99 mg/dL 102(H) 94 95  BUN 8 - 23 mg/dL '18 22 20  ' Creatinine 0.44 - 1.00 mg/dL 1.05(H) 1.02(H) 1.00  Sodium 135 - 145  mmol/L 136 137 142  Potassium 3.5 - 5.1 mmol/L 3.6 3.8 3.4(L)  Chloride 98 - 111 mmol/L 100 95(L) 104  CO2 22 - 32 mmol/L '26 24 25  ' Calcium 8.9 - 10.3 mg/dL 9.1 9.6 9.3  Total Protein 6.5 - 8.1 g/dL 10.2(H) - 9.9(H)  Total Bilirubin 0.3 - 1.2 mg/dL 0.6 - 0.5  Alkaline Phos 38 - 126 U/L 48 - 69  AST 15 - 41 U/L 23 - 25  ALT 0 - 44 U/L 13 - 15      Component Value Date/Time   RBC 2.89 (L) 07/12/2021 0830   MCV 101.4 (H) 07/12/2021 0830   MCV 92 06/04/2021 1354   MCH 32.2 07/12/2021 0830   MCHC 31.7 07/12/2021 0830   RDW 16.9 (H) 07/12/2021 0830   RDW 15.7 (H) 06/04/2021 1354   LYMPHSABS 1.7 07/12/2021 0830   LYMPHSABS 2.1 02/25/2021 1314   MONOABS 0.7 07/12/2021 0830   EOSABS 0.2 07/12/2021 0830   EOSABS 0.2 02/25/2021 1314   BASOSABS 0.1 07/12/2021 0830   BASOSABS 0.1 02/25/2021 1314    DIAGNOSTIC IMAGING:  I have independently reviewed the scans and discussed with the patient. NM PET Image Restag (PS) Skull Base To Thigh  Result Date: 06/30/2021 CLINICAL DATA:  Subsequent treatment strategy for myeloma. EXAM: NUCLEAR MEDICINE PET SKULL VERTEX THROUGH MID THIGHS. TECHNIQUE: 8.9 mCi F-18 FDG was injected intravenously. Full-ring PET imaging was performed from the head to the mid thigh region after the radiotracer. CT data was obtained and used for attenuation correction and anatomic localization. Fasting blood glucose: 106 mg/dl COMPARISON:  Multiple exams, including PET-CT 04/07/2020 FINDINGS: Mediastinal blood pool activity: SUV max 2.4 HEAD/NECK: No significant abnormal hypermetabolic activity in this region. Anterior tongue activity is likely physiologic. Incidental CT findings: none CHEST: No significant abnormal hypermetabolic activity in this region. Incidental CT findings: Dual lead pacer device. Prominent main pulmonary artery. Mild cardiomegaly. Bandlike opacities in the lower lobes similar to 03/01/2021 favoring scarring. Punctate high densities peripherally in the right  lower lobe on image 56 of series 7 are stable in could represent calcifications, remotely aspirated barium, a likely inconsequential amount of embolized methacrylate. ABDOMEN/PELVIS: Photopenic right renal cyst. Incidental CT findings: The spleen measures 11.8 by 6.0 by 8.6 cm (volume = 320 cm^3), within normal limits. Right kidney lower pole nonobstructive nephrolithiasis. SKELETON: Multiple new hypermetabolic skeletal lesions with involvement of the left anterior iliac crest, left mid humerus, right superior pubic ramus, bilateral mid femurs, and left calvarium. The largest lesion is the lytic lesion anteriorly in the left iliac crest measuring 2.9 by 2.3 cm on image 221 series 3, maximum SUV 15.4. The more distal of two foci in the left humeral diaphysis maximum SUV of 4.9. A new lucent lesion in the left parietal calvarium measures 0.6 by 0.5 cm on image 31 of series 3, maximum SUV 5.7. Incidental CT findings: none IMPRESSION: 1. Progressive myeloma, with several scattered  hypermetabolic skeletal lesions including the bony pelvis, left mid humerus, bilateral mid femurs, right superior pubic ramus, and left calvarium. 2. Prominent main pulmonary artery, cannot exclude pulmonary arterial hypertension. Mild cardiomegaly. 3. Stable bibasilar scarring. 4. Nonobstructive right nephrolithiasis. Electronically Signed   By: Van Clines M.D.   On: 06/30/2021 10:20   CT Biopsy  Result Date: 07/12/2021 INDICATION: Smoldering multiple myeloma EXAM: CT GUIDED BONE MARROW ASPIRATES AND BIOPSY MEDICATIONS: None. ANESTHESIA/SEDATION: Fentanyl 50 mcg IV Moderate Sedation Time:  10 minutes The patient was continuously monitored during the procedure by the interventional radiology nurse under my direct supervision. COMPLICATIONS: None immediate. PROCEDURE: The procedure was explained to the patient. The risks and benefits of the procedure were discussed and the patient's questions were addressed. Informed consent was  obtained from the patient. The patient was placed prone on CT table. Images of the pelvis were obtained. The right side of back was prepped and draped in sterile fashion. The skin and right posterior ilium were anesthetized with 1% lidocaine. 11 gauge bone needle was directed into the right ilium with CT guidance. Two aspirates and 1 core biopsy were obtained. Bandage placed over the puncture site. IMPRESSION: CT guided bone marrow aspiration and core biopsy. Electronically Signed   By: Miachel Roux M.D.   On: 07/12/2021 13:40   CT BONE MARROW BIOPSY & ASPIRATION  Result Date: 07/12/2021 INDICATION: Smoldering multiple myeloma EXAM: CT GUIDED BONE MARROW ASPIRATES AND BIOPSY MEDICATIONS: None. ANESTHESIA/SEDATION: Fentanyl 50 mcg IV Moderate Sedation Time:  10 minutes The patient was continuously monitored during the procedure by the interventional radiology nurse under my direct supervision. COMPLICATIONS: None immediate. PROCEDURE: The procedure was explained to the patient. The risks and benefits of the procedure were discussed and the patient's questions were addressed. Informed consent was obtained from the patient. The patient was placed prone on CT table. Images of the pelvis were obtained. The right side of back was prepped and draped in sterile fashion. The skin and right posterior ilium were anesthetized with 1% lidocaine. 11 gauge bone needle was directed into the right ilium with CT guidance. Two aspirates and 1 core biopsy were obtained. Bandage placed over the puncture site. IMPRESSION: CT guided bone marrow aspiration and core biopsy. Electronically Signed   By: Miachel Roux M.D.   On: 07/12/2021 13:40     ASSESSMENT:  1.  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  Increased number of atypical plasma cells present 36% of all cell lines.  Plasma cells are kappa light chain restricted. -Unfortunately her  specimen has reached cytogenetics and FISH lab week later due to bad weather and no viable plasma cells. -PET scan on 07/09/2019 showed no findings of active myeloma. -24-hour urine shows total protein 59 mg.  Urine immunofixation shows IgA kappa monoclonal protein.  LDH is 184. -Based on [facility] criteria she has high risk with about 45-50% probability of progression to myeloma in the next 2 years. -CTAP on 01/07/2020 shows acute superior endplate burst fracture of L1.  Mild associated paravertebral soft tissue thickening.  No other bone abnormalities. -Bone density test on 02/07/2020 shows T score -0.5. - Bone marrow biopsy on 07/12/2021: Hypercellular with increased number of atypical plasma cells representing 30% of cells, displaying kappa light chain restriction. - FISH panel: Gain of 1 q. (high risk), T p53 deletion (high risk), monosomy 13 (standard risk) - Cytogenetics: Pending - PET scan (06/24/2021): New  hypermetabolic bone lesions involving left anterior iliac crest, left mid humerus, right superior pubic ramus, bilateral mid femurs, left calvarium.  Largest lytic lesion in the left iliac crest measuring 2.9 x 2.3 cm.  2.  Social/family history: - She uses walker to ambulate since her right knee replacement leading to stiffness.  She lives with older sister and husband who has dementia. - She is independent of ADLs and IADLs.  She also drives.   PLAN:  1.  IgA kappa multiple myeloma, high risk with del 17p: - We have reviewed images of the PET scan and results which shows new bone lesions. - We reviewed results of bone marrow biopsy which shows 30% plasma cells with hypocellular marrow. - We reviewed FISH results which shows high risk features like del 17 P and gain of 1 q. mutation. - Chromosome analysis is pending at this time.  I have recommended checking LDH, beta-2 microglobulin and repeating CMP. - We talked about her new diagnosis and need to initiate therapy for myeloma soon as  possible. - She is borderline candidate for transplant.  However she has high risk features on the FISH.  We will consider quadruplet regimen, but likely start with the triple drug regimen and add on if she tolerates well. - We discussed initiation of treatment in noncurative setting.  We also talked about the standard of bone marrow transplant in patients with high risk features. - We will send Revlimid 15 mg days 1-14 every 21 days prescription. - RTC next week to discuss results of cytogenetics and treatment plan.   2.  Normocytic anemia: - CBC on 07/12/2021 shows hemoglobin 9.3, MCV 101.  Platelet count is 134. - Iron panel on 06/09/2021 with ferritin 279, percent saturation 24. - Normocytic anemia from multiple myeloma.   3.  Pulmonary embolism: - She had pulmonary embolism in 2015.  Continue Coumadin.  4.  Hypertension: - Continue amlodipine 10 mg daily and metoprolol half tablet twice daily.  Blood pressure today is 130/73.  Orders placed this encounter:  No orders of the defined types were placed in this encounter.    Derek Jack, MD [place] [phone]   I, Thana Ates, am acting as a scribe for Dr. Derek Jack.  I, Derek Jack MD, have reviewed the above documentation for accuracy and completeness, and I agree with the above.

## 2021-07-23 ENCOUNTER — Encounter (HOSPITAL_COMMUNITY): Payer: Self-pay | Admitting: Hematology

## 2021-07-23 ENCOUNTER — Telehealth (HOSPITAL_COMMUNITY): Payer: Self-pay | Admitting: Pharmacy Technician

## 2021-07-23 ENCOUNTER — Encounter (HOSPITAL_COMMUNITY): Payer: Self-pay | Admitting: Adult Health

## 2021-07-23 ENCOUNTER — Other Ambulatory Visit (HOSPITAL_COMMUNITY): Payer: Self-pay

## 2021-07-23 ENCOUNTER — Telehealth (HOSPITAL_COMMUNITY): Payer: Self-pay | Admitting: Pharmacist

## 2021-07-23 ENCOUNTER — Ambulatory Visit (INDEPENDENT_AMBULATORY_CARE_PROVIDER_SITE_OTHER): Payer: Medicare Other | Admitting: Licensed Clinical Social Worker

## 2021-07-23 DIAGNOSIS — F32A Depression, unspecified: Secondary | ICD-10-CM

## 2021-07-23 DIAGNOSIS — M810 Age-related osteoporosis without current pathological fracture: Secondary | ICD-10-CM

## 2021-07-23 DIAGNOSIS — F419 Anxiety disorder, unspecified: Secondary | ICD-10-CM

## 2021-07-23 DIAGNOSIS — K219 Gastro-esophageal reflux disease without esophagitis: Secondary | ICD-10-CM

## 2021-07-23 DIAGNOSIS — D508 Other iron deficiency anemias: Secondary | ICD-10-CM

## 2021-07-23 DIAGNOSIS — R5383 Other fatigue: Secondary | ICD-10-CM

## 2021-07-23 DIAGNOSIS — R251 Tremor, unspecified: Secondary | ICD-10-CM

## 2021-07-23 DIAGNOSIS — D472 Monoclonal gammopathy: Secondary | ICD-10-CM

## 2021-07-23 DIAGNOSIS — G8929 Other chronic pain: Secondary | ICD-10-CM

## 2021-07-23 LAB — BETA 2 MICROGLOBULIN, SERUM: Beta-2 Microglobulin: 3.5 mg/L — ABNORMAL HIGH (ref 0.6–2.4)

## 2021-07-23 MED ORDER — LENALIDOMIDE 15 MG PO CAPS: 15.0000 mg | ORAL_CAPSULE | Freq: Every day | ORAL | 0 refills | Status: DC

## 2021-07-23 NOTE — Telephone Encounter (Signed)
Oral Oncology Patient Advocate Encounter ?  ?Received notification from [employer] that prior authorization for Revlimid is required. ?  ?PA submitted on CoverMyMeds ?Key [id number] ?Status is pending ?  ?Oral Oncology Clinic will continue to follow. ? ?Dennison Nancy CPHT ?Specialty Pharmacy Patient Advocate ?[place] ?Phone [phone] ?Fax [phone] ?07/23/2021 2:24 PM ? ?

## 2021-07-23 NOTE — Telephone Encounter (Signed)
Oral Oncology Pharmacist Encounter  Received new prescription for Revlimid (lenalidomide) for the treatment of newly diagnosed IgA kappa multiple myeloma in conjunction likely with bortezomib, dexamethasone, and potentially daratumumab, planned duration until disease control or unacceptable drug toxicity.  CMP from 07/22/21 assessed, no relevant lab abnormalities. Prescription dose and frequency assessed.   Current medication list in Epic reviewed, no DDIs with lenalidomide identified.  Evaluated chart and no patient barriers to medication adherence identified.   Prescription has been e-scribed to [employer] for benefits analysis and approval.  Oral Oncology Clinic will continue to follow for insurance authorization, copayment issues, initial counseling and start date.   Darl Pikes, PharmD, BCPS, BCOP, CPP Hematology/Oncology Clinical Pharmacist Practitioner [place] [employer] [facility] [phone]  07/23/2021 2:38 PM

## 2021-07-23 NOTE — Chronic Care Management (AMB) (Signed)
Chronic Care Management    Clinical Social Work Note  07/23/2021 Name: Courtney Rogers MRN: [id number] DOB: [birth year]  Courtney Rogers Charo is a 71 y.o. year old female who is a primary care patient of Renee Rival, FNP. The CCM team was consulted to assist the patient with chronic disease management and/or care coordination needs related to: [employer] .   Engaged with patient by telephone for follow up visit in response to provider referral for social work chronic care management and care coordination services.   Consent to Services:  The patient was given information about Chronic Care Management services, agreed to services, and gave verbal consent prior to initiation of services.  Please see initial visit note for detailed documentation.   Patient agreed to services and consent obtained.   Assessment: Review of patient past medical history, allergies, medications, and health status, including review of relevant consultants reports was performed today as part of a comprehensive evaluation and provision of chronic care management and care coordination services.     SDOH (Social Determinants of Health) assessments and interventions performed:  SDOH Interventions    Flowsheet Row Most Recent Value  SDOH Interventions   Physical Activity Interventions Other (Comments)  [walking challenges. she uses a walker to help her walk]  Stress Interventions Provide Counseling  [client has stress related to managing her medical needs. client has stress in managing the needs of her spouse]  Depression Interventions/Treatment  Counseling, Medication        Advanced Directives Status: See Vyn[employer] application for related entries.  CCM Care Plan  Allergies  Allergen Reactions   Amoxicillin-Pot Clavulanate Other (See Comments)   Biaxin [Clarithromycin] Other (See Comments)    Stomach problems    Erythromycin    Lisinopril Swelling    Outpatient Encounter Medications as of  07/23/2021  Medication Sig   acetaminophen (TYLENOL) 500 MG tablet Take 1,000 mg by mouth 2 (two) times daily as needed for moderate pain or headache.   albuterol (PROVENTIL HFA;VENTOLIN HFA) 108 (90 BASE) MCG/ACT inhaler Inhale 2 puffs into the lungs every 6 (six) hours as needed for shortness of breath.   amLODipine (NORVASC) 10 MG tablet Take 1 tablet (10 mg total) by mouth daily.   aspirin EC 81 MG tablet Take 81 mg by mouth at bedtime.   B Complex Vitamins (VITAMIN B COMPLEX) TABS Take 1 tablet by mouth daily.   busPIRone (BUSPAR) 5 MG tablet Take 5 mg by mouth daily.   Calcium Carbonate-Vitamin D (CALCIUM 600+D PO) Take 1 tablet by mouth 2 (two) times daily.   chlorthalidone (HYGROTON) 25 MG tablet Take 25 mg by mouth daily.    cholecalciferol (VITAMIN D) 25 MCG (1000 UNIT) tablet Take 1,000 Units by mouth daily.   fluticasone (FLONASE) 50 MCG/ACT nasal spray Place 2 sprays into both nostrils daily.   fluticasone-salmeterol (ADVAIR) 250-50 MCG/ACT AEPB Inhale 1 puff into the lungs 2 (two) times daily.   folic acid (FOLVITE) 1 MG tablet TAKE 1 TABLET BY MOUTH ONCE DAILY. (Patient taking differently: Take 1 mg by mouth daily.)   gabapentin (NEURONTIN) 600 MG tablet Take 600 mg by mouth 2 (two) times daily.   lenalidomide (REVLIMID) 15 MG capsule Take 1 capsule (15 mg total) by mouth daily. 14 days on, 7 days off   lidocaine (XYLOCAINE) 2 % solution TAKE 2 TEASPOONFULS BEFORE MEALS AND AT BEDTIME AS NEEDED MAY REPEAT EVERY 4 HOURS. (MAX OF 8 DOSES PER DAY)   Lidocaine HCl (XOLIDO) 2 %  CREA Apply 1 application topically daily as needed (arthritisis).   losartan (COZAAR) 100 MG tablet Take 100 mg by mouth daily.   metoprolol tartrate (LOPRESSOR) 50 MG tablet Take 1 tablet (50 mg total) by mouth 2 (two) times daily.   mirabegron ER (MYRBETRIQ) 25 MG TB24 tablet Take 1 tablet (25 mg total) by mouth daily.   mirtazapine (REMERON) 15 MG tablet Take 15 mg by mouth at bedtime. (Patient not taking:  Reported on 06/16/2021)   montelukast (SINGULAIR) 10 MG tablet Take 10 mg by mouth daily.    nitroGLYCERIN (NITROSTAT) 0.4 MG SL tablet Place 0.4 mg under the tongue every 5 (five) minutes as needed for chest pain.   pantoprazole (PROTONIX) 40 MG tablet Take 40 mg by mouth daily.   potassium chloride (K-DUR,KLOR-CON) 10 MEQ tablet Take 10 mEq by mouth daily.   primidone (MYSOLINE) 50 MG tablet Take 50 mg by mouth daily.   Probiotic Product (GNP PROBIOTIC COLON SUPPORT) CAPS Take 1 capsule by mouth daily.   Probiotic Product (PROBIOTIC PO) Take 1 capsule by mouth daily. [facility].   sertraline (ZOLOFT) 100 MG tablet Take 200 mg by mouth daily.    Simethicone (GAS-X PO) Take 2 tablets by mouth daily as needed (gas).   simvastatin (ZOCOR) 40 MG tablet Take 40 mg by mouth at bedtime.   vitamin B-12 (CYANOCOBALAMIN) 1000 MCG tablet Take 1,000 mcg by mouth daily.   vitamin C (ASCORBIC ACID) 500 MG tablet Take 500 mg by mouth daily.   warfarin (COUMADIN) 3 MG tablet Take by mouth.   No facility-administered encounter medications on file as of 07/23/2021.    Patient Active Problem List   Diagnosis Date Noted   Anxiety and depression 06/18/2021   Fatigue 06/04/2021   Abnormal gait 05/06/2021   Amnesia 05/06/2021   Anemia due to chronic blood loss 05/06/2021   Asthma 05/06/2021   Atherosclerotic heart disease of native coronary artery without angina pectoris 05/06/2021   Cardiac pacemaker in situ 05/06/2021   Chronic pain 05/06/2021   Coagulation disorder ([place]) 05/06/2021   Panic disorder 05/06/2021   H/O: hysterectomy 05/06/2021   IgA myeloma ([place]) 05/06/2021   Long term (current) use of anticoagulants 05/06/2021   Major depressive disorder, single episode, unspecified 05/06/2021   Mild intermittent asthma 05/06/2021   Mixed hyperlipidemia 05/06/2021   Monoclonal gammopathy 05/06/2021   Obesity 05/06/2021   Personal history of pulmonary embolism 05/06/2021   Recurrent major  depression in remission ([place]) 05/06/2021   Severe recurrent major depression without psychotic features ([place]) 05/06/2021   Skin sensation disturbance 05/06/2021   Unspecified mononeuropathy of left upper limb 05/06/2021   Urinary incontinence 05/06/2021   Tremors of nervous system 04/13/2021   Left hip pain 04/13/2021   Allergic rhinitis 03/22/2021   Allergic sinusitis 03/22/2021   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   Abdominal pain 02/02/2021   Buttock pain 10/01/2020   Bilateral knee pain 07/15/2020   Immunization due 07/15/2020   Encounter for subsequent annual wellness visit (AWV) in Medicare patient 07/15/2020   Easy bruising 04/30/2020   Wedge compression fracture of first lumbar vertebra, subsequent encounter for fracture with routine healing 04/06/2020   Trapezius muscle spasm 02/27/2020   Body mass index (BMI) 28.0-28.9, adult 01/14/2020   DDD (degenerative disc disease), cervical 01/02/2020   Hoarseness of voice 11/27/2019   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 manage daily needs of client and her spouse. manage anxiety issues   Start Date: 07/23/2021  Expected End Date: 10/21/2021  This Visit's Progress: On track  Recent Progress: On track  Priority: Medium  Note:   Current Barriers:  Anxiety issues Mobility issues Caregiver stress issues Suicidal Ideation/Homicidal Ideation: No  Clinical Social Work Goal(s):  patient will work with SW monthly by  telephone or in person to reduce or manage symptoms related to anxiety and anxiety issues Patient will talk with SW monthly about caregiver stress issues Patient will attend all scheduled medical appointments for client in next 30 days Patient will call RNCM as needed in next 30 days to discuss nursing needs of client  Interventions: 1:1 collaboration with Renee Rival, FNP regarding development and update of comprehensive plan of care as evidenced by provider attestation and co-signature Discussed with Joycelyn Schmid her current needs Discussed with Ronalee the needs of her spouse.  She said he needs help with bathing and some daily tasks. LCSW talked with Joycelyn Schmid about [facility] in home support, based in [place], [place]. LCSW talked with Joycelyn Schmid about sitter services and CNA services for in home needs through [facility]. Discussed mood of client. Oliviarose said she takes Zoloft as prescribed. She thinks it is working well. She thinks her mood is stable. She has help in the home from her sister who resides with her and Cloe's spouse. Client sister helps with buying groceries , cleaning home , and helps prepare meals often Discussed transport needs. Thomasene said she drives as needed to appointments and to complete errands Provided counseling support for Nichoel Talked with Joycelyn Schmid about caregiver stress issues Discussed sleeping issues of client. Keajah said she is sleeping well.  Encouraged Joycelyn Schmid to call RNCM as needed in next 30 days for nursing support for client  Patient Self Care Activities:  Attends all scheduled provider appointments Performs ADL's independently  Patient Coping Strengths:  Family Friends  Patient Self Care Deficits:  Walking challenges Caregiver stress issues  Patient Goals:  - spend time or talk with others at least 2 to 3 times per week - practice relaxation or meditation daily - keep a calendar with appointment dates  Follow Up Plan: LCSW to call client  on 09/10/21 at 1:00 PM to assess client needs. at that time.       Norva Riffle.Gregoire Bennis MSW, [place] [facility] representative [facility] Care Management [phone]

## 2021-07-23 NOTE — Patient Instructions (Addendum)
Visit Information ? ?Patient goals:  Manage Anxiety issues. Manage Caregiver Stress issues ? ?Time Frame:  Short Term Goals ?Priority;  Medium ?Progress;  On Track ? ?Start Date:  07/23/21   ?Expected End Date:  10/21/21 ? ?Follow up Date:  09/10/21 at 1:00 PM ? ?Manage Anxiety issues. Manage Caregiver Stress issues ? ?Patient Self Care Activities:  ?Attends all scheduled provider appointments ?Performs ADL's independently ? ?Patient Coping Strengths:  ?Family ?Friends ? ?Patient Self Care Deficits:  ?Walking challenges ?Caregiver stress issues ?Sleeping challenges ? ?Patient Goals:  ?- spend time or talk with others at least 2 to 3 times per week ?- practice relaxation or meditation daily ?- keep a calendar with appointment dates ? ?Follow Up Plan: LCSW to call client on 09/10/21 at 1:00 PM to assess client needs at that time.  ? ?Norva Riffle.Courtney Rogers MSW, LCSW ?Licensed Clinical Social Worker ?[facility] ?[phone] ?

## 2021-07-23 NOTE — Telephone Encounter (Signed)
Oral Oncology Patient Advocate Encounter ? ?Prior Authorization for Revlimid (Lenalidamide) has been approved.   ? ?PA# [id number] ?Effective dates: 05/23/21 through 07/22/24 ? ?Test claim processed at [facility] resulted with a co-pay of $32.66.  Copay at [employer] may differ. ? ?Oral Oncology Clinic will continue to follow.  ? ?Dennison Nancy CPHT ?Specialty Pharmacy Patient Advocate ?[place] ?Phone [phone] ?Fax [phone] ?07/23/2021 2:32 PM ? ?

## 2021-07-23 NOTE — Telephone Encounter (Signed)
Revlimid sent per Dr. Delton Coombes. ?

## 2021-07-26 ENCOUNTER — Encounter (HOSPITAL_COMMUNITY): Payer: Self-pay | Admitting: *Deleted

## 2021-07-26 ENCOUNTER — Other Ambulatory Visit (HOSPITAL_COMMUNITY): Payer: Self-pay

## 2021-07-26 MED ORDER — LENALIDOMIDE 15 MG PO CAPS: 15.0000 mg | ORAL_CAPSULE | Freq: Every day | ORAL | 0 refills | Status: DC

## 2021-07-29 ENCOUNTER — Other Ambulatory Visit: Payer: Self-pay

## 2021-07-29 ENCOUNTER — Inpatient Hospital Stay (HOSPITAL_COMMUNITY): Payer: Medicare Other

## 2021-07-29 ENCOUNTER — Ambulatory Visit (HOSPITAL_COMMUNITY)
Admission: RE | Admit: 2021-07-29 | Discharge: 2021-07-29 | Disposition: A | Discharge status: Home / Self Care | Payer: Medicare Other | Source: Ambulatory Visit | Attending: Hematology | Admitting: Hematology

## 2021-07-29 ENCOUNTER — Encounter (HOSPITAL_COMMUNITY): Payer: Self-pay | Admitting: Adult Health

## 2021-07-29 ENCOUNTER — Encounter (HOSPITAL_COMMUNITY): Payer: Self-pay | Admitting: Lab

## 2021-07-29 ENCOUNTER — Inpatient Hospital Stay (HOSPITAL_BASED_OUTPATIENT_CLINIC_OR_DEPARTMENT_OTHER): Payer: Medicare Other | Admitting: Hematology

## 2021-07-29 VITALS — BP 152/82 | HR 78 | Temp 96.2°F | Resp 18 | Ht 63.58 in | Wt 171.6 lb

## 2021-07-29 DIAGNOSIS — I351 Nonrheumatic aortic (valve) insufficiency: Secondary | ICD-10-CM | POA: Insufficient documentation

## 2021-07-29 DIAGNOSIS — I252 Old myocardial infarction: Secondary | ICD-10-CM | POA: Insufficient documentation

## 2021-07-29 DIAGNOSIS — Z0189 Encounter for other specified special examinations: Secondary | ICD-10-CM

## 2021-07-29 DIAGNOSIS — Z950 Presence of cardiac pacemaker: Secondary | ICD-10-CM | POA: Insufficient documentation

## 2021-07-29 DIAGNOSIS — E785 Hyperlipidemia, unspecified: Secondary | ICD-10-CM | POA: Insufficient documentation

## 2021-07-29 DIAGNOSIS — C9000 Multiple myeloma not having achieved remission: Secondary | ICD-10-CM | POA: Diagnosis not present

## 2021-07-29 DIAGNOSIS — Z01818 Encounter for other preprocedural examination: Secondary | ICD-10-CM | POA: Insufficient documentation

## 2021-07-29 DIAGNOSIS — I10 Essential (primary) hypertension: Secondary | ICD-10-CM | POA: Diagnosis not present

## 2021-07-29 DIAGNOSIS — D472 Monoclonal gammopathy: Secondary | ICD-10-CM

## 2021-07-29 LAB — CBC WITH DIFFERENTIAL/PLATELET
Abs Immature Granulocytes: 0.1 10*3/uL — ABNORMAL HIGH (ref 0.00–0.07)
Basophils Absolute: 0 10*3/uL (ref 0.0–0.1)
Basophils Relative: 0 %
Eosinophils Absolute: 0.2 10*3/uL (ref 0.0–0.5)
Eosinophils Relative: 2 %
HCT: 30.7 % — ABNORMAL LOW (ref 36.0–46.0)
Hemoglobin: 9.8 g/dL — ABNORMAL LOW (ref 12.0–15.0)
Immature Granulocytes: 1 %
Lymphocytes Relative: 22 %
Lymphs Abs: 1.6 10*3/uL (ref 0.7–4.0)
MCH: 32.3 pg (ref 26.0–34.0)
MCHC: 31.9 g/dL (ref 30.0–36.0)
MCV: 101.3 fL — ABNORMAL HIGH (ref 80.0–100.0)
Monocytes Absolute: 0.6 10*3/uL (ref 0.1–1.0)
Monocytes Relative: 8 %
Neutro Abs: 4.9 10*3/uL (ref 1.7–7.7)
Neutrophils Relative %: 67 %
Platelets: 120 10*3/uL — ABNORMAL LOW (ref 150–400)
RBC: 3.03 MIL/uL — ABNORMAL LOW (ref 3.87–5.11)
RDW: 16.1 % — ABNORMAL HIGH (ref 11.5–15.5)
WBC: 7.4 10*3/uL (ref 4.0–10.5)
nRBC: 0 % (ref 0.0–0.2)

## 2021-07-29 LAB — ECHOCARDIOGRAM COMPLETE
AR max vel: 2.3 cm2
AV Area VTI: 2.38 cm2
AV Area mean vel: 2.13 cm2
AV Mean grad: 5 mmHg
AV Peak grad: 10.4 mmHg
Ao pk vel: 1.61 m/s
Area-P 1/2: 3.51 cm2
Calc EF: 50.1 %
Height: 63.583 in
MV M vel: 5.64 m/s
MV Peak grad: 127.2 mmHg
MV VTI: 2.03 cm2
P 1/2 time: 593 msec
Radius: 0.55 cm
S' Lateral: 3.1 cm
Single Plane A2C EF: 51.3 %
Single Plane A4C EF: 51.8 %
Weight: 2745.6 oz

## 2021-07-29 MED ORDER — LENALIDOMIDE 15 MG PO CAPS: 15.0000 mg | ORAL_CAPSULE | Freq: Every day | ORAL | 0 refills | Status: DC

## 2021-07-29 MED ORDER — ACYCLOVIR 400 MG PO TABS: 400.0000 mg | ORAL_TABLET | Freq: Every day | ORAL | 6 refills | Status: DC

## 2021-07-29 NOTE — Progress Notes (Unsigned)
Referral sent to Dr Aris Lot at [facility].  They will review records and then contact pt for apt, ?

## 2021-07-29 NOTE — Patient Instructions (Addendum)
Courtney Rogers at [facility] ?Discharge Instructions ? ? ?You were seen and examined today by Dr. Delton Coombes. ? ?He discussed the results of your cytogenetics testing.  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  He discussed treatment options for this with you. It would involve coming to the clinic weekly to receive chemotherapy infusions for 3 weeks in a row with one week off.  This cycle repeats every 4 weeks (3 weeks on and one week off). The name of the drug you'll receive in the clinic is Kyprolis.  The other drugs in the treatment regimen are a steroid pill (dexamethasone) that we will give you weekly in the clinic when you come for treatment. You have the Revlimid pills and will take those 14 days on and then take a 7 day break before restarting the pills.  There is also a shot we will give you in the clinic.  It is given in the skin in your belly.  It is called Darzalex. We would do this treatment for several months and then send you for a bone marrow transplant.  This would be done at [facility] in [place]. Bone marrow transplant would require you to stay close to [facility] for 4-6 weeks. You would still require treatment after transplant, but it would be maintenance therapy.  ? ?We will refer you for port a cath placement in order to give you the drug called Kyprolis so your veins don't give out.  ? ? ? ? ?Thank you for choosing [facility] at [facility] to provide your oncology and hematology care.  To afford each patient quality time with our provider, please arrive at least 15 minutes before your scheduled appointment time.  ? ?If you have a lab appointment with the [facility] please come in thru the Main Entrance and check in at the main information desk. ? ?You need to re-schedule your appointment should you arrive 10 or more minutes late.  We strive to give you quality time with our providers, and arriving late affects you and other  patients whose appointments are after yours.  Also, if you no show three or more times for appointments you may be dismissed from the clinic at the providers discretion.     ?Again, thank you for choosing [facility].  Our hope is that these requests will decrease the amount of time that you wait before being seen by our physicians.       ?_____________________________________________________________ ? ?Should you have questions after your visit to [facility], please contact our office at [phone] and follow the prompts.  Our office hours are 8:00 a.m. and 4:30 p.m. Monday - Friday.  Please note that voicemails left after 4:00 p.m. may not be returned until the following business day.  We are closed weekends and major holidays.  You do have access to a nurse 24-7, just call the main number to the clinic [phone] and do not press any options, hold on the line and a nurse will answer the phone.   ? ?For prescription refill requests, have your pharmacy contact our office and allow 72 hours.   ? ?Due to Covid, you will need to wear a mask upon entering the hospital. If you do not have a mask, a mask will be given to you at the Main Entrance upon arrival. For doctor visits, patients may have 1 support person age 106 or older with them. For  treatment visits, patients can not have anyone with them due to social distancing guidelines and our immunocompromised population.  ? ?   ?

## 2021-07-29 NOTE — Progress Notes (Signed)
*  PRELIMINARY RESULTS* ?Echocardiogram ?2D Echocardiogram has been performed. ? ?Courtney Rogers ?07/29/2021, 12:31 PM ?

## 2021-07-29 NOTE — Progress Notes (Signed)
[address] [place], [place] [address]   CLINIC:  Medical Oncology/Hematology  PCP:  Renee Rival, FNP [address] / [place] [address]  [phone]  REASON FOR VISIT:  Follow-up for multiple myeloma  PRIOR THERAPY: none  CURRENT THERAPY: surveillance  INTERVAL HISTORY:  [name], a 71 y.o. female, returns for routine follow-up for her multiple myeloma. Yazlynn was last seen on 07/22/2021.  Today she reports feeling good. She reports she prefers Coumadin over Eliquis, and she has never tried Xarelto. She reports tremors in her hands bilaterally. She reports 1 episode of abdominal pain which she believes may have been caused by food she ate prior to the pain.   REVIEW OF SYSTEMS:  Review of Systems  Constitutional:  Negative for appetite change and fatigue.  Gastrointestinal:  Positive for abdominal pain (x1).  Psychiatric/Behavioral:  Positive for depression. The patient is nervous/anxious.   All other systems reviewed and are negative.  PAST MEDICAL/SURGICAL HISTORY:  Past Medical History:  Diagnosis Date   Acute myocardial infarction [facility]) 2009   CAD/no stent medically managed   Anxiety disorder    CAD (coronary artery disease)    Hyperlipidemia    Hypertension    IBS (irritable bowel syndrome)    Non-ulcer dyspepsia 04/30/2009   Qualifier: Diagnosis of  By: Oneida Alar MD, Sandi L    Overactive bladder    PE (pulmonary embolism) JUN 2014   Presence of permanent cardiac pacemaker    Sleep apnea    Past Surgical History:  Procedure Laterality Date   ABDOMINAL HYSTERECTOMY     BSO secondary to cyst     CARDIAC CATHETERIZATION     COLONOSCOPY  12/2009   Dr. West Carbo, propofol, normal. Next TCS 12/2019   COLONOSCOPY N/A 05/22/2017   Procedure: COLONOSCOPY;  Surgeon: Danie Binder, MD;  [facility];  Service: Endoscopy;  Laterality: N/A;  10:30am   ESOPHAGOGASTRODUODENOSCOPY   05/11/2009   schatzki ring/small hiatal hernia/path:gastritis   ESOPHAGOGASTRODUODENOSCOPY N/A 05/22/2017   Procedure: ESOPHAGOGASTRODUODENOSCOPY (EGD);  Surgeon: Danie Binder, MD;  Location: [facility];  Service: Endoscopy;  Laterality: N/A;   ESOPHAGOGASTRODUODENOSCOPY (EGD) WITH ESOPHAGEAL DILATION N/A 04/01/2013   [id number] at the gastroesophageal juction/multiple small polyps/mild gastritis   GIVENS CAPSULE STUDY N/A 06/07/2017   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    SOCIAL HISTORY:  Social History   Socioeconomic History   Marital status: Married    Spouse name: Games developer   Number of children: 3   Years of education: 16   Highest education level: Not on file  Occupational History   Not on file  Tobacco Use   Smoking status: Never   Smokeless tobacco: Never   Tobacco comments:    Never smoked   Vaping Use   Vaping Use: Never used  Substance and Sexual Activity   Alcohol use: Yes    Alcohol/week: 0.0 standard drinks    Comment: occ wine   Drug use: No   Sexual activity: Not Currently  Other Topics Concern   Not on file  Social History Narrative      Lives with husband-51 years 952 in Aug 2021   Daughter is close  by, 2 sons live further away   One IN [place],ONE IN [place], [place].        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   Enjoys: reading, young adult      Diet: eats all food groups    Caffeine: coffee 1, tea daily soda-1 daily   Water: 1-2 cups      Wears seat belt    Does not use phone while driving   Oceanographer at home    Public house manager -locked up      Left handed   One story home   Drinks caffeine   Social Determinants of Health   Financial Resource Strain: Low Risk    Difficulty  of Paying Living Expenses: Not hard at all  Food Insecurity: No Food Insecurity   Worried About Charity fundraiser in the Last Year: Never true   Arboriculturist in the Last Year: Never true  Transportation Needs: No Transportation Needs   Lack of Transportation (Medical): No   Lack of Transportation (Non-Medical): No  Physical Activity: Inactive   Days of Exercise per Week: 0 days   Minutes of Exercise per Session: 0 min  Stress: Stress Concern Present   Feeling of Stress : To some extent  Social Connections: Moderately Integrated   Frequency of Communication with Friends and Family: Once a week   Frequency of Social Gatherings with Friends and Family: Once a week   Attends Religious Services: More than 4 times per year   Active Member of [employer] or Organizations: Yes   Attends Music therapist: More than 4 times per year   Marital Status: Married  Human resources officer Violence: Not At Risk   Fear of Current or Ex-Partner: No   Emotionally Abused: No   Physically Abused: No   Sexually Abused: No    FAMILY HISTORY:  Family History  Problem Relation Age of Onset   Colon polyps Neg Hx    Colon cancer Neg Hx     CURRENT MEDICATIONS:  Current Outpatient Medications  Medication Sig Dispense Refill   acetaminophen (TYLENOL) 500 MG tablet Take 1,000 mg by mouth 2 (two) times daily as needed for moderate pain or headache.     acyclovir (ZOVIRAX) 400 MG tablet Take 1 tablet (400 mg total) by mouth 5 (five) times daily. 60 tablet 6   albuterol (PROVENTIL HFA;VENTOLIN HFA) 108 (90 BASE) MCG/ACT inhaler Inhale 2 puffs into the lungs every 6 (six) hours as needed for shortness of breath.     amLODipine (NORVASC) 10 MG tablet Take 1 tablet (10 mg total) by mouth daily. 90 tablet 1   aspirin EC 81 MG tablet Take 81 mg by mouth at bedtime.     B Complex Vitamins (VITAMIN B COMPLEX) TABS Take 1 tablet by mouth daily.     busPIRone (BUSPAR) 5 MG tablet Take 5 mg by mouth daily.      Calcium Carbonate-Vitamin D (CALCIUM 600+D PO) Take 1 tablet by mouth 2 (two) times daily.     chlorthalidone (HYGROTON) 25 MG tablet Take 25 mg by mouth daily.      cholecalciferol (VITAMIN D) 25 MCG (1000 UNIT) tablet Take 1,000 Units by mouth daily.     fluticasone (FLONASE) 50 MCG/ACT nasal spray Place 2 sprays into both nostrils daily. 16 g 6   fluticasone-salmeterol (ADVAIR) 250-50 MCG/ACT AEPB Inhale 1 puff into the lungs 2 (two) times daily.  folic acid (FOLVITE) 1 MG tablet TAKE 1 TABLET BY MOUTH ONCE DAILY. (Patient taking differently: Take 1 mg by mouth daily.) 30 tablet 11   gabapentin (NEURONTIN) 600 MG tablet Take 600 mg by mouth 2 (two) times daily.     lidocaine (XYLOCAINE) 2 % solution TAKE 2 TEASPOONFULS BEFORE MEALS AND AT BEDTIME AS NEEDED MAY REPEAT EVERY 4 HOURS. (MAX OF 8 DOSES PER DAY) 300 mL 0   Lidocaine HCl (XOLIDO) 2 % CREA Apply 1 application topically daily as needed (arthritisis).     losartan (COZAAR) 100 MG tablet Take 100 mg by mouth daily.     metoprolol tartrate (LOPRESSOR) 50 MG tablet Take 1 tablet (50 mg total) by mouth 2 (two) times daily. 60 tablet 3   mirabegron ER (MYRBETRIQ) 25 MG TB24 tablet Take 1 tablet (25 mg total) by mouth daily. 30 tablet 2   mirtazapine (REMERON) 15 MG tablet Take 15 mg by mouth at bedtime.     montelukast (SINGULAIR) 10 MG tablet Take 10 mg by mouth daily.      nitroGLYCERIN (NITROSTAT) 0.4 MG SL tablet Place 0.4 mg under the tongue every 5 (five) minutes as needed for chest pain.     pantoprazole (PROTONIX) 40 MG tablet Take 40 mg by mouth daily.     potassium chloride (K-DUR,KLOR-CON) 10 MEQ tablet Take 10 mEq by mouth daily.     primidone (MYSOLINE) 50 MG tablet Take 50 mg by mouth daily.     Probiotic Product (GNP PROBIOTIC COLON SUPPORT) CAPS Take 1 capsule by mouth daily.     Probiotic Product (PROBIOTIC PO) Take 1 capsule by mouth daily. [facility].     sertraline (ZOLOFT) 100 MG tablet Take 200 mg by mouth  daily.      Simethicone (GAS-X PO) Take 2 tablets by mouth daily as needed (gas).     simvastatin (ZOCOR) 40 MG tablet Take 40 mg by mouth at bedtime.     vitamin B-12 (CYANOCOBALAMIN) 1000 MCG tablet Take 1,000 mcg by mouth daily.     vitamin C (ASCORBIC ACID) 500 MG tablet Take 500 mg by mouth daily.     warfarin (COUMADIN) 3 MG tablet Take by mouth.     lenalidomide (REVLIMID) 15 MG capsule Take 1 capsule (15 mg total) by mouth daily. 21 days on, 7 days off every 28 days 21 capsule 0   No current facility-administered medications for this visit.    ALLERGIES:  Allergies  Allergen Reactions   Amoxicillin-Pot Clavulanate Other (See Comments)   Biaxin [Clarithromycin] Other (See Comments)    Stomach problems    Erythromycin    Lisinopril Swelling    PHYSICAL EXAM:  Performance status (ECOG): 1 - Symptomatic but completely ambulatory  Vitals:   07/29/21 0950  BP: (!) 152/82  Pulse: 78  Resp: 18  Temp: (!) 96.2 F (35.7 C)  SpO2: 95%   Wt Readings from Last 3 Encounters:  07/29/21 171 lb 9.6 oz (77.8 kg)  07/22/21 169 lb 8 oz (76.9 kg)  06/18/21 165 lb 1.9 oz (74.9 kg)   Physical Exam Vitals reviewed.  Constitutional:      Appearance: Normal appearance.  Cardiovascular:     Rate and Rhythm: Normal rate and regular rhythm.     Pulses: Normal pulses.     Heart sounds: Normal heart sounds.  Pulmonary:     Effort: Pulmonary effort is normal.     Breath sounds: Normal breath sounds.  Neurological:  General: No focal deficit present.     Mental Status: She is alert and oriented to person, place, and time.  Psychiatric:        Mood and Affect: Mood normal.        Behavior: Behavior normal.    LABORATORY DATA:  I have reviewed the labs as listed.  CBC Latest Ref Rng & Units 07/12/2021 06/09/2021 06/04/2021  WBC 4.0 - 10.5 K/uL 7.4 7.0 7.2  Hemoglobin 12.0 - 15.0 g/dL 9.3(L) 10.8(L) 11.0(L)  Hematocrit 36.0 - 46.0 % 29.3(L) 32.8(L) 31.5(L)  Platelets 150 - 400 K/uL  134(L) 120(L) 137(L)   CMP Latest Ref Rng & Units 07/22/2021 06/09/2021 06/04/2021  Glucose 70 - 99 mg/dL 99 102(H) 94  BUN 8 - 23 mg/dL _0 Creatinine 0.44 - 1.00 mg/dL 0.82 1.05(H) 1.02(H)  Sodium 135 - 145 mmol/L 140 136 137  Potassium 3.5 - 5.1 mmol/L 3.8 3.6 3.8  Chloride 98 - 111 mmol/L 102 100 95(L)  CO2 22 - 32 mmol/L _1 Calcium 8.9 - 10.3 mg/dL 9.5 9.1 9.6  Total Protein 6.5 - 8.1 g/dL 10.9(H) 10.2(H) -  Total Bilirubin 0.3 - 1.2 mg/dL 0.6 0.6 -  Alkaline Phos 38 - 126 U/L 66 48 -  AST 15 - 41 U/L 23 23 -  ALT 0 - 44 U/L 14 13 -      Component Value Date/Time   RBC 2.89 (L) 07/12/2021 0830   MCV 101.4 (H) 07/12/2021 0830   MCV 92 06/04/2021 1354   MCH 32.2 07/12/2021 0830   MCHC 31.7 07/12/2021 0830   RDW 16.9 (H) 07/12/2021 0830   RDW 15.7 (H) 06/04/2021 1354   LYMPHSABS 1.7 07/12/2021 0830   LYMPHSABS 2.1 02/25/2021 1314   MONOABS 0.7 07/12/2021 0830   EOSABS 0.2 07/12/2021 0830   EOSABS 0.2 02/25/2021 1314   BASOSABS 0.1 07/12/2021 0830   BASOSABS 0.1 02/25/2021 1314    DIAGNOSTIC IMAGING:  I have independently reviewed the scans and discussed with the patient. CT Biopsy  Result Date: 07/12/2021 INDICATION: Smoldering multiple myeloma EXAM: CT GUIDED BONE MARROW ASPIRATES AND BIOPSY MEDICATIONS: None. ANESTHESIA/SEDATION: Fentanyl 50 mcg IV Moderate Sedation Time:  10 minutes The patient was continuously monitored during the procedure by the interventional radiology nurse under my direct supervision. COMPLICATIONS: None immediate. PROCEDURE: The procedure was explained to the patient. The risks and benefits of the procedure were discussed and the patient's questions were addressed. Informed consent was obtained from the patient. The patient was placed prone on CT table. Images of the pelvis were obtained. The right side of back was prepped and draped in sterile fashion. The skin and right posterior ilium were anesthetized with 1% lidocaine. 11 gauge bone  needle was directed into the right ilium with CT guidance. Two aspirates and 1 core biopsy were obtained. Bandage placed over the puncture site. IMPRESSION: CT guided bone marrow aspiration and core biopsy. Electronically Signed   By: Miachel Roux M.D.   On: 07/12/2021 13:40   CT BONE MARROW BIOPSY & ASPIRATION  Result Date: 07/12/2021 INDICATION: Smoldering multiple myeloma EXAM: CT GUIDED BONE MARROW ASPIRATES AND BIOPSY MEDICATIONS: None. ANESTHESIA/SEDATION: Fentanyl 50 mcg IV Moderate Sedation Time:  10 minutes The patient was continuously monitored during the procedure by the interventional radiology nurse under my direct supervision. COMPLICATIONS: None immediate. PROCEDURE: The procedure was explained to the patient. The risks and benefits of the procedure were discussed and the patient's questions were addressed. Informed consent  was obtained from the patient. The patient was placed prone on CT table. Images of the pelvis were obtained. The right side of back was prepped and draped in sterile fashion. The skin and right posterior ilium were anesthetized with 1% lidocaine. 11 gauge bone needle was directed into the right ilium with CT guidance. Two aspirates and 1 core biopsy were obtained. Bandage placed over the puncture site. IMPRESSION: CT guided bone marrow aspiration and core biopsy. Electronically Signed   By: Miachel Roux M.D.   On: 07/12/2021 13:40     ASSESSMENT:  1.  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  Increased number of atypical plasma cells present 36% of all cell lines.  Plasma cells are kappa light chain restricted. -Unfortunately her specimen has reached cytogenetics and FISH lab week later due to bad weather and no viable plasma cells. -PET scan on 07/09/2019 showed no findings of active myeloma. -24-hour urine shows total protein 59 mg.  Urine immunofixation shows IgA kappa monoclonal  protein.  LDH is 184. -Based on [facility] criteria she has high risk with about 45-50% probability of progression to myeloma in the next 2 years. -CTAP on 01/07/2020 shows acute superior endplate burst fracture of L1.  Mild associated paravertebral soft tissue thickening.  No other bone abnormalities. -Bone density test on 02/07/2020 shows T score -0.5. - Bone marrow biopsy on 07/12/2021: Hypercellular with increased number of atypical plasma cells representing 30% of cells, displaying kappa light chain restriction. - FISH panel: Gain of 1 q. (high risk), T p53 deletion (high risk), monosomy 13 (standard risk) - Cytogenetics: Complex karyotype. - PET scan (06/24/2021): New hypermetabolic bone lesions involving left anterior iliac crest, left mid humerus, right superior pubic ramus, bilateral mid femurs, left calvarium.  Largest lytic lesion in the left iliac crest measuring 2.9 x 2.3 cm.  2.  Social/family history: - She uses walker to ambulate since her right knee replacement leading to stiffness.  She lives with older sister and husband who has dementia. - She is independent of ADLs and IADLs.  She also drives.   PLAN:  1.  Stage II IgA kappa multiple myeloma, high risk with del 17p: - We have discussed her cytogenetics which showed complex karyotype. - We discussed poor prognosis associated with high risk myeloma. - We discussed carfilzomib containing quadruplet regimen as induction treatment to obtain best possible results.  I have also recommended stem cell transplant after induction regimen. - I have talked to [facility]is [facility] who has agreed with the plan. - We will give her carfilzomib 3 weeks on/1 week off.  We will give her Revlimid 15 mg 3 weeks on/1 week off.  Dexamethasone will be 20 mg weekly. - We will obtain baseline echocardiogram. - She will also require port placement. - We will likely start her treatment next week.   2.  Normocytic anemia: - CBC shows  hemoglobin 9.8, MCV 101.  Platelet count is 120. - Normocytic anemia from multiple myeloma.   3.  Pulmonary embolism: - She had pulmonary embolism in 2015.  Continue Coumadin.  She had tried Eliquis in the past but did not like it.  4.  Hypertension: - Continue amlodipine 10 mg daily and metoprolol half tablet twice daily.  5.  ID prophylaxis: - We will start her on acyclovir 400 mg twice daily.  Orders placed this encounter:  Orders Placed This Encounter  Procedures   ECHOCARDIOGRAM COMPLETE     Derek Jack, MD [place] [phone]   I, Thana Ates, am acting as a scribe for Dr. Derek Jack.  I, Derek Jack MD, have reviewed the above documentation for accuracy and completeness, and I agree with the above.

## 2021-07-30 ENCOUNTER — Ambulatory Visit: Payer: Medicare Other | Admitting: Licensed Clinical Social Worker

## 2021-07-30 ENCOUNTER — Telehealth: Payer: Self-pay | Admitting: Nurse Practitioner

## 2021-07-30 ENCOUNTER — Encounter (HOSPITAL_COMMUNITY): Payer: Self-pay

## 2021-07-30 DIAGNOSIS — R251 Tremor, unspecified: Secondary | ICD-10-CM

## 2021-07-30 DIAGNOSIS — I10 Essential (primary) hypertension: Secondary | ICD-10-CM

## 2021-07-30 DIAGNOSIS — K219 Gastro-esophageal reflux disease without esophagitis: Secondary | ICD-10-CM

## 2021-07-30 DIAGNOSIS — D508 Other iron deficiency anemias: Secondary | ICD-10-CM

## 2021-07-30 DIAGNOSIS — R5383 Other fatigue: Secondary | ICD-10-CM

## 2021-07-30 DIAGNOSIS — F419 Anxiety disorder, unspecified: Secondary | ICD-10-CM

## 2021-07-30 DIAGNOSIS — D472 Monoclonal gammopathy: Secondary | ICD-10-CM

## 2021-07-30 DIAGNOSIS — F32A Depression, unspecified: Secondary | ICD-10-CM

## 2021-07-30 NOTE — Patient Instructions (Addendum)
Visit Information ? ?Patient goals: Manage Anxiety issues. Manage Caregiver Stress issues ? ?Time Frame:  Short Term Goal ?Priority;  High  ?Progress;  Not On Track ? ?Start Date:  07/23/21   ?Expected End Date:  10/21/21 ? ?Follow up Date:  09/10/21 at 1:00 PM ? ?Manage Anxiety issues. Manage Caregiver Stress issues ? ?Patient Self Care Activities:  ?Attends all scheduled provider appointments ?Performs ADL's independently ? ?Patient Coping Strengths:  ?Family ?Friends ? ?Patient Self Care Deficits:  ?Walking challenges ?Caregiver stress issues ?Sleeping challenges ? ?Patient Goals:  ?- spend time or talk with others at least 2 to 3 times per week ?- practice relaxation or meditation daily ?- keep a calendar with appointment dates ? ?Follow Up Plan: LCSW to call client on 09/10/21 at 1:00 PM to assess client needs at that time.  ? ?Norva Riffle.Samarie Pinder MSW, LCSW ?Licensed Clinical Social Worker ?[employer] ?[phone] ?

## 2021-07-30 NOTE — Telephone Encounter (Signed)
Patient called in regard to recent bad news (bone cancer) patient wants a call back in regard to how best to deal with the news she has received. ? ?

## 2021-07-30 NOTE — Progress Notes (Signed)
Patient called wanting to know how to take her cancer pills. Called patient back and made her aware to bring pills with her to appointment on 08/05/21 and they will let her know how she should be taking them at that appointment. ?

## 2021-07-30 NOTE — Chronic Care Management (AMB) (Signed)
Chronic Care Management    Clinical Social Work Note  07/30/2021 Name: Courtney Rogers MRN: [id number] DOB: 11/01/1950  Courtney Rogers is a 71 y.o. year old female who is a primary care patient of Renee Rival, FNP. The CCM team was consulted to assist the patient with chronic disease management and/or care coordination needs related to: [employer] .   Engaged with patient by telephone for follow up visit in response to provider referral for social work chronic care management and care coordination services.   Consent to Services:  The patient was given information about Chronic Care Management services, agreed to services, and gave verbal consent prior to initiation of services.  Please see initial visit note for detailed documentation.   Patient agreed to services and consent obtained.   Assessment: Review of patient past medical history, allergies, medications, and health status, including review of relevant consultants reports was performed today as part of a comprehensive evaluation and provision of chronic care management and care coordination services.     SDOH (Social Determinants of Health) assessments and interventions performed:  SDOH Interventions    Flowsheet Row Most Recent Value  SDOH Interventions   Physical Activity Interventions Other (Comments)  [walking challenges. uses a walker to help her walk]  Stress Interventions Provide Counseling  [client has stress related to new cancer diagnosis]  Depression Interventions/Treatment  Counseling        Advanced Directives Status: See [employer]yn[employer] application for related entries.  CCM Care Plan  Allergies  Allergen Reactions   Amoxicillin-Pot Clavulanate Other (See Comments)   Biaxin [Clarithromycin] Other (See Comments)    Stomach problems    Erythromycin    Lisinopril Swelling    Outpatient Encounter Medications as of 07/30/2021  Medication Sig   acetaminophen (TYLENOL) 500 MG tablet Take 1,000 mg  by mouth 2 (two) times daily as needed for moderate pain or headache.   acyclovir (ZOVIRAX) 400 MG tablet Take 1 tablet (400 mg total) by mouth 5 (five) times daily.   albuterol (PROVENTIL HFA;VENTOLIN HFA) 108 (90 BASE) MCG/ACT inhaler Inhale 2 puffs into the lungs every 6 (six) hours as needed for shortness of breath.   amLODipine (NORVASC) 10 MG tablet Take 1 tablet (10 mg total) by mouth daily.   aspirin EC 81 MG tablet Take 81 mg by mouth at bedtime.   B Complex Vitamins (VITAMIN B COMPLEX) TABS Take 1 tablet by mouth daily.   busPIRone (BUSPAR) 5 MG tablet Take 5 mg by mouth daily.   Calcium Carbonate-Vitamin D (CALCIUM 600+D PO) Take 1 tablet by mouth 2 (two) times daily.   chlorthalidone (HYGROTON) 25 MG tablet Take 25 mg by mouth daily.    cholecalciferol (VITAMIN D) 25 MCG (1000 UNIT) tablet Take 1,000 Units by mouth daily.   fluticasone (FLONASE) 50 MCG/ACT nasal spray Place 2 sprays into both nostrils daily.   fluticasone-salmeterol (ADVAIR) 250-50 MCG/ACT AEPB Inhale 1 puff into the lungs 2 (two) times daily.   folic acid (FOLVITE) 1 MG tablet TAKE 1 TABLET BY MOUTH ONCE DAILY. (Patient taking differently: Take 1 mg by mouth daily.)   gabapentin (NEURONTIN) 600 MG tablet Take 600 mg by mouth 2 (two) times daily.   lenalidomide (REVLIMID) 15 MG capsule Take 1 capsule (15 mg total) by mouth daily. 21 days on, 7 days off every 28 days   lidocaine (XYLOCAINE) 2 % solution TAKE 2 TEASPOONFULS BEFORE MEALS AND AT BEDTIME AS NEEDED MAY REPEAT EVERY 4 HOURS. (MAX OF 8  DOSES PER DAY)   Lidocaine HCl (XOLIDO) 2 % CREA Apply 1 application topically daily as needed (arthritisis).   losartan (COZAAR) 100 MG tablet Take 100 mg by mouth daily.   metoprolol tartrate (LOPRESSOR) 50 MG tablet Take 1 tablet (50 mg total) by mouth 2 (two) times daily.   mirabegron ER (MYRBETRIQ) 25 MG TB24 tablet Take 1 tablet (25 mg total) by mouth daily.   mirtazapine (REMERON) 15 MG tablet Take 15 mg by mouth at  bedtime.   montelukast (SINGULAIR) 10 MG tablet Take 10 mg by mouth daily.    nitroGLYCERIN (NITROSTAT) 0.4 MG SL tablet Place 0.4 mg under the tongue every 5 (five) minutes as needed for chest pain.   pantoprazole (PROTONIX) 40 MG tablet Take 40 mg by mouth daily.   potassium chloride (K-DUR,KLOR-CON) 10 MEQ tablet Take 10 mEq by mouth daily.   primidone (MYSOLINE) 50 MG tablet Take 50 mg by mouth daily.   Probiotic Product (GNP PROBIOTIC COLON SUPPORT) CAPS Take 1 capsule by mouth daily.   Probiotic Product (PROBIOTIC PO) Take 1 capsule by mouth daily. [facility].   sertraline (ZOLOFT) 100 MG tablet Take 200 mg by mouth daily.    Simethicone (GAS-X PO) Take 2 tablets by mouth daily as needed (gas).   simvastatin (ZOCOR) 40 MG tablet Take 40 mg by mouth at bedtime.   vitamin B-12 (CYANOCOBALAMIN) 1000 MCG tablet Take 1,000 mcg by mouth daily.   vitamin C (ASCORBIC ACID) 500 MG tablet Take 500 mg by mouth daily.   warfarin (COUMADIN) 3 MG tablet Take by mouth.   No facility-administered encounter medications on file as of 07/30/2021.    Patient Active Problem List   Diagnosis Date Noted   Anxiety and depression 06/18/2021   Fatigue 06/04/2021   Abnormal gait 05/06/2021   Amnesia 05/06/2021   Anemia due to chronic blood loss 05/06/2021   Asthma 05/06/2021   Atherosclerotic heart disease of native coronary artery without angina pectoris 05/06/2021   Cardiac pacemaker in situ 05/06/2021   Chronic pain 05/06/2021   Coagulation disorder ([place]) 05/06/2021   Panic disorder 05/06/2021   H/O: hysterectomy 05/06/2021   IgA myeloma ([place]) 05/06/2021   Long term (current) use of anticoagulants 05/06/2021   Major depressive disorder, single episode, unspecified 05/06/2021   Mild intermittent asthma 05/06/2021   Mixed hyperlipidemia 05/06/2021   Monoclonal gammopathy 05/06/2021   Obesity 05/06/2021   Personal history of pulmonary embolism 05/06/2021   Recurrent major depression in  remission ([place]) 05/06/2021   Severe recurrent major depression without psychotic features ([place]) 05/06/2021   Skin sensation disturbance 05/06/2021   Unspecified mononeuropathy of left upper limb 05/06/2021   Urinary incontinence 05/06/2021   Tremors of nervous system 04/13/2021   Left hip pain 04/13/2021   Allergic rhinitis 03/22/2021   Allergic sinusitis 03/22/2021   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   Abdominal pain 02/02/2021   Buttock pain 10/01/2020   Bilateral knee pain 07/15/2020   Immunization due 07/15/2020   Encounter for subsequent annual wellness visit (AWV) in Medicare patient 07/15/2020   Easy bruising 04/30/2020   Wedge compression fracture of first lumbar vertebra, subsequent encounter for fracture with routine healing 04/06/2020   Trapezius muscle spasm 02/27/2020   Body mass index (BMI) 28.0-28.9, adult 01/14/2020   DDD (degenerative disc disease), cervical 01/02/2020   Hoarseness of voice 11/27/2019   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 manage daily needs of client and her spouse. manage anxiety issues   Start Date: 07/30/2021  Expected End Date: 10/28/2021  This Visit's Progress: Not on track  Recent Progress: On track  Priority: Medium  Note:   Current Barriers:  Anxiety issues Mobility issues Caregiver stress issues Suicidal Ideation/Homicidal Ideation: No  Clinical Social Work Goal(s):  patient will work with SW monthly by telephone or  in person to reduce or manage symptoms related to anxiety and anxiety issues Patient will talk with SW monthly about caregiver stress issues Patient will attend all scheduled medical appointments for client in next 30 days Patient will call RNCM as needed in next 30 days to discuss nursing needs of client  Interventions: 1:1 collaboration with Renee Rival, FNP regarding development and update of comprehensive plan of care as evidenced by provider attestation and co-signature Discussed with Joycelyn Schmid her current needs Discussed with Herberta the needs of her spouse.   Discussed client recent diagnosis of bone cancer. She said she had gone to MD at [facility] recently and was given diagnosis of bone cancer. She said she will start chemotherapy treatment one time weekly starting next week. She said her daughter will transport her to and from this treatment next week Provided counseling support for client Used Active Listening Techniques to allow client to discuss her feelings regarding this recent diagnoses. She said she has talked with her children about diagnosis. One son lives nearby. One son lives in [place]. Her daughter is supportive, per client. LCSW talked with Joycelyn Schmid about [facility] in [place] , [place]. LCSW informed Kaylani that this [facility] can be very helpful and supportive of individuals who are managing cancer treatments.  Informed client that Center has guest speakers to talk about challenges of treatment. Informed client that she may be able to join a support group at [place]. Client was appreciative of this information Encouraged client to call LCSW as needed for SW support Talked with client about family support. Her sister lives with Joycelyn Schmid and Palyn's spouse.  Discussed mood of client. Coco said she  had been a little sad since receiving diagnoses. But, she is talking with her children and talking with her sister about  situation and trying to plan for her upcoming treatments. Encouraged Joycelyn Schmid to call RNCM as needed in next 30 days for nursing support for client  Patient Self Care Activities:  Attends all scheduled provider appointments Performs ADL's independently  Patient Coping Strengths:  Family Friends  Patient Self Care Deficits:  Walking challenges Caregiver stress issues  Patient Goals:  - spend time or talk with others at least 2 to 3 times per week - practice relaxation or meditation daily - keep a calendar with appointment dates  Follow Up Plan: LCSW to call client on 09/10/21 at 1:00 PM to assess client needs. at that time.       Norva Riffle.Jerrit Horen MSW, [place] [facility] representative [facility] Care Management [phone]

## 2021-07-30 NOTE — Progress Notes (Signed)
Primary Care Physician: Renee Rival, FNP  Primary Gastroenterologist:  Elon Alas. Abbey Chatters, DO   Chief Complaint  Patient presents with   Follow-up    HPI: Courtney Rogers is a 71 y.o. female here for follow-up of IBS, GERD.  Last seen, 2022.  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  Bone marrow biopsy obtained.  Saw Dr. Delton Coombes yesterday, plans for carfilzomib containing quadruplet regimen as induction treatment, followed by stem cell transplant.  Reflux well controlled. No abdominal pain. MOM as needed for constipation, has used a couple of times in the last month. Generally has a BM about every other day. No melena, brbpr. No dysphagia. Appetite good so far. No n/v. Weight over all stable.    EGD December 2018: -Multiple gastric polyps. Resected and retrieved.  Biopsy: Benign fundic gland polyp. -NO OBVIOUS SOURCE FOR NORMOCYTIC ANEMIA IDENTIFIED. -Small bowel biopsy negative for celiac, gastric biopsy with mild gastritis, no H. pylori.   Colonoscopy December 2018: -The examined portion of the ileum was normal. - There was significant looping of the colon. - External HEMORRHOIDS and RECTAL BLEEDING DUE TO internal hemorrhoids. - MILD Diverticulosis in the recto-sigmoid colon and in the sigmoid colon. -No repeat screening colonoscopy due to age   Small bowel capsule endoscopy January 2019: -Frequent gastric erosions -Normal small bowel mucosa  CT abdomen pelvis with contrast October 2022: IMPRESSION: Tiny right renal calculus. No evidence of ureteral calculi, hydronephrosis, or other acute findings.  Current Outpatient Medications  Medication Sig Dispense Refill   acetaminophen (TYLENOL) 500 MG tablet Take 1,000 mg by mouth 2 (two) times daily as needed  for moderate pain or headache.     acyclovir (ZOVIRAX) 400 MG tablet Take 1 tablet (400 mg total) by mouth 5 (five) times daily. 60 tablet 6   albuterol (PROVENTIL HFA;VENTOLIN HFA) 108 (90 BASE) MCG/ACT inhaler Inhale 2 puffs into the lungs every 6 (six) hours as needed for shortness of breath.     amLODipine (NORVASC) 10 MG tablet Take 1 tablet (10 mg total) by mouth daily. 90 tablet 1   aspirin EC 81 MG tablet Take 81 mg by mouth at bedtime.     B Complex Vitamins (VITAMIN B COMPLEX) TABS Take 1 tablet by mouth daily.     busPIRone (BUSPAR) 5 MG tablet Take 5 mg by mouth daily.     Calcium Carbonate-Vitamin D (CALCIUM 600+D PO) Take 1 tablet by mouth 2 (two) times daily.     chlorthalidone (HYGROTON) 25 MG tablet Take 25 mg by mouth daily.      cholecalciferol (VITAMIN D) 25 MCG (1000 UNIT) tablet Take 1,000 Units by mouth daily.     fluticasone (FLONASE) 50 MCG/ACT nasal spray Place 2 sprays into both nostrils daily. 16 g 6   fluticasone-salmeterol (ADVAIR) 250-50 MCG/ACT AEPB Inhale 1 puff into the lungs 2 (two) times daily.     folic acid (FOLVITE) 1 MG tablet TAKE 1 TABLET BY MOUTH ONCE DAILY. (Patient taking differently: Take 1 mg by mouth daily.) 30 tablet 11   gabapentin (NEURONTIN) 600 MG tablet Take 600 mg by mouth 2 (two) times daily.     lenalidomide (REVLIMID) 15 MG capsule Take 1 capsule (15 mg total) by mouth daily. 21 days on, 7 days off every 28 days 21 capsule 0  lidocaine (XYLOCAINE) 2 % solution TAKE 2 TEASPOONFULS BEFORE MEALS AND AT BEDTIME AS NEEDED MAY REPEAT EVERY 4 HOURS. (MAX OF 8 DOSES PER DAY) 300 mL 0   Lidocaine HCl (XOLIDO) 2 % CREA Apply 1 application topically daily as needed (arthritisis).     losartan (COZAAR) 100 MG tablet Take 100 mg by mouth daily.     metoprolol tartrate (LOPRESSOR) 50 MG tablet Take 1 tablet (50 mg total) by mouth 2 (two) times daily. 60 tablet 3   mirtazapine (REMERON) 15 MG tablet Take 15 mg by mouth at bedtime.     montelukast  (SINGULAIR) 10 MG tablet Take 10 mg by mouth daily.      nitroGLYCERIN (NITROSTAT) 0.4 MG SL tablet Place 0.4 mg under the tongue every 5 (five) minutes as needed for chest pain.     pantoprazole (PROTONIX) 40 MG tablet Take 40 mg by mouth daily.     potassium chloride (K-DUR,KLOR-CON) 10 MEQ tablet Take 10 mEq by mouth daily.     primidone (MYSOLINE) 50 MG tablet Take 50 mg by mouth daily.     Probiotic Product (GNP PROBIOTIC COLON SUPPORT) CAPS Take 1 capsule by mouth daily.     Probiotic Product (PROBIOTIC PO) Take 1 capsule by mouth daily. [facility].     sertraline (ZOLOFT) 100 MG tablet Take 200 mg by mouth daily.      Simethicone (GAS-X PO) Take 2 tablets by mouth daily as needed (gas).     simvastatin (ZOCOR) 40 MG tablet Take 40 mg by mouth at bedtime.     vitamin B-12 (CYANOCOBALAMIN) 1000 MCG tablet Take 1,000 mcg by mouth daily.     vitamin C (ASCORBIC ACID) 500 MG tablet Take 500 mg by mouth daily.     warfarin (COUMADIN) 3 MG tablet Take by mouth.     No current facility-administered medications for this visit.    Allergies as of 08/02/2021 - Review Complete 08/02/2021  Allergen Reaction Noted   Amoxicillin-pot clavulanate Other (See Comments) 07/29/2020   Biaxin [clarithromycin] Other (See Comments)    Erythromycin  04/02/2020   Lisinopril Swelling     ROS:  General: Negative for anorexia, weight loss, fever, chills, fatigue, weakness. ENT: Negative for hoarseness, difficulty swallowing , nasal congestion. CV: Negative for chest pain, angina, palpitations, dyspnea on exertion, peripheral edema.  Respiratory: Negative for dyspnea at rest, dyspnea on exertion, cough, sputum, wheezing.  GI: See history of present illness. GU:  Negative for dysuria, hematuria, urinary incontinence, urinary frequency, nocturnal urination.  Endo: Negative for unusual weight change.    Physical Examination:   BP 140/90 (BP Location: Right Arm, Patient Position: Sitting, Cuff  Size: Normal)    Pulse 85    Temp (!) 97.1 F (36.2 C) (Temporal)    Ht _0  (1.6 m)    Wt 166 lb 3.2 oz (75.4 kg)    SpO2 98%    BMI 29.44 kg/m   General: Well-nourished, well-developed in no acute distress. Ambulates with walker. Eyes: No icterus. Mouth:masked Abdomen: Bowel sounds are normal, nontender, nondistended, no hepatosplenomegaly or masses, no abdominal bruits or hernia , no rebound or guarding.   Extremities: No lower extremity edema. No clubbing or deformities. Neuro: Alert and oriented x 4   Skin: Warm and dry, no jaundice.   Psych: Alert and cooperative, normal mood and affect.  Labs:  Lab Results  Component Value Date   CREATININE 0.82 07/22/2021   BUN 19 07/22/2021   NA 140  07/22/2021   K 3.8 07/22/2021   CL 102 07/22/2021   CO2 26 07/22/2021   Lab Results  Component Value Date   ALT 14 07/22/2021   AST 23 07/22/2021   ALKPHOS 66 07/22/2021   BILITOT 0.6 07/22/2021   Lab Results  Component Value Date   WBC 7.4 07/29/2021   HGB 9.8 (L) 07/29/2021   HCT 30.7 (L) 07/29/2021   MCV 101.3 (H) 07/29/2021   PLT 120 (L) 07/29/2021   Lab Results  Component Value Date   IRON 70 06/09/2021   TIBC 291 06/09/2021   FERRITIN 279 06/09/2021   Lab Results  Component Value Date   VITAMINB12 680 10/21/2020   Lab Results  Component Value Date   FOLATE >100.0 10/21/2020     Imaging Studies: CT Biopsy  Result Date: 07-18-2021 INDICATION: Smoldering multiple myeloma EXAM: CT GUIDED BONE MARROW ASPIRATES AND BIOPSY MEDICATIONS: None. ANESTHESIA/SEDATION: Fentanyl 50 mcg IV Moderate Sedation Time:  10 minutes The patient was continuously monitored during the procedure by the interventional radiology nurse under my direct supervision. COMPLICATIONS: None immediate. PROCEDURE: The procedure was explained to the patient. The risks and benefits of the procedure were discussed and the patient's questions were addressed. Informed consent was obtained from the patient. The  patient was placed prone on CT table. Images of the pelvis were obtained. The right side of back was prepped and draped in sterile fashion. The skin and right posterior ilium were anesthetized with 1% lidocaine. 11 gauge bone needle was directed into the right ilium with CT guidance. Two aspirates and 1 core biopsy were obtained. Bandage placed over the puncture site. IMPRESSION: CT guided bone marrow aspiration and core biopsy. Electronically Signed   By: Miachel Roux M.D.   On: 18-Jul-2021 13:40   CT BONE MARROW BIOPSY & ASPIRATION  Result Date: 07/18/21 INDICATION: Smoldering multiple myeloma EXAM: CT GUIDED BONE MARROW ASPIRATES AND BIOPSY MEDICATIONS: None. ANESTHESIA/SEDATION: Fentanyl 50 mcg IV Moderate Sedation Time:  10 minutes The patient was continuously monitored during the procedure by the interventional radiology nurse under my direct supervision. COMPLICATIONS: None immediate. PROCEDURE: The procedure was explained to the patient. The risks and benefits of the procedure were discussed and the patient's questions were addressed. Informed consent was obtained from the patient. The patient was placed prone on CT table. Images of the pelvis were obtained. The right side of back was prepped and draped in sterile fashion. The skin and right posterior ilium were anesthetized with 1% lidocaine. 11 gauge bone needle was directed into the right ilium with CT guidance. Two aspirates and 1 core biopsy were obtained. Bandage placed over the puncture site. IMPRESSION: CT guided bone marrow aspiration and core biopsy. Electronically Signed   By: Miachel Roux M.D.   On: 07-18-2021 13:40   ECHOCARDIOGRAM COMPLETE  Result Date: 07/29/2021    ECHOCARDIOGRAM REPORT   Patient Name:   Courtney Rogers Date of Exam: 07/29/2021 Medical Rec #:  [id number]         Height:       63.6 in Accession #:    [id number]        Weight:       171.6 lb Date of Birth:  1950-08-22         BSA:          1.824 m Patient Age:    32  years          BP:  147/82 mmHg Patient Gender: F                 HR:           55 bpm. Exam Location:  [facility] Procedure: 2D Echo, Cardiac Doppler and Color Doppler Indications:    Chemo  History:        Patient has no prior history of Echocardiogram examinations.                 Previous Myocardial Infarction, Pacemaker; Risk                 Factors:Hypertension and Dyslipidemia.  Sonographer:    Wenda Low Referring Phys: Schleicher  1. No strain imaging done.  2. Distal septal and inferior apical hypokinesis . Left ventricular ejection fraction, by estimation, is 50 to 55%. The left ventricle has low normal function. The left ventricle demonstrates regional wall motion abnormalities (see scoring diagram/findings for description). The left ventricular internal cavity size was mildly dilated. Left ventricular diastolic parameters were normal.  3. PPM wire in RA/RV. Right ventricular systolic function is normal. The right ventricular size is normal. There is mildly elevated pulmonary artery systolic pressure.  4. Left atrial size was mildly dilated.  5. The mitral valve is normal in structure. Trivial mitral valve regurgitation. No evidence of mitral stenosis.  6. Small gradient across AV no AS . The aortic valve is tricuspid. There is mild calcification of the aortic valve. Aortic valve regurgitation is mild. Aortic valve sclerosis is present, with no evidence of aortic valve stenosis.  7. The inferior vena cava is normal in size with greater than 50% respiratory variability, suggesting right atrial pressure of 3 mmHg. FINDINGS  Left Ventricle: Distal septal and inferior apical hypokinesis. Left ventricular ejection fraction, by estimation, is 50 to 55%. The left ventricle has low normal function. The left ventricle demonstrates regional wall motion abnormalities. The left ventricular internal cavity size was mildly dilated. There is no left ventricular hypertrophy.  Left ventricular diastolic parameters were normal. Right Ventricle: PPM wire in RA/RV. The right ventricular size is normal. No increase in right ventricular wall thickness. Right ventricular systolic function is normal. There is mildly elevated pulmonary artery systolic pressure. The tricuspid regurgitant velocity is 2.73 m/s, and with an assumed right atrial pressure of 15 mmHg, the estimated right ventricular systolic pressure is 67.2 mmHg. Left Atrium: Left atrial size was mildly dilated. Right Atrium: Right atrial size was normal in size. Pericardium: There is no evidence of pericardial effusion. Mitral Valve: The mitral valve is normal in structure. There is mild thickening of the mitral valve leaflet(s). Trivial mitral valve regurgitation. No evidence of mitral valve stenosis. MV peak gradient, 10.5 mmHg. The mean mitral valve gradient is 4.0 mmHg. Tricuspid Valve: The tricuspid valve is normal in structure. Tricuspid valve regurgitation is mild . No evidence of tricuspid stenosis. Aortic Valve: Small gradient across AV no AS. The aortic valve is tricuspid. There is mild calcification of the aortic valve. Aortic valve regurgitation is mild. Aortic regurgitation PHT measures 593 msec. Aortic valve sclerosis is present, with no evidence of aortic valve stenosis. Aortic valve mean gradient measures 5.0 mmHg. Aortic valve peak gradient measures 10.4 mmHg. Aortic valve area, by VTI measures 2.38 cm. Pulmonic Valve: The pulmonic valve was normal in structure. Pulmonic valve regurgitation is not visualized. No evidence of pulmonic stenosis. Aorta: The aortic root is normal in size and structure. Venous: The inferior vena cava is  normal in size with greater than 50% respiratory variability, suggesting right atrial pressure of 3 mmHg. IAS/Shunts: No atrial level shunt detected by color flow Doppler. Additional Comments: No strain imaging done.  LEFT VENTRICLE PLAX 2D LVIDd:         5.20 cm      Diastology LVIDs:          3.10 cm      LV e' medial:    0.05 cm/s LV PW:         1.10 cm      LV E/e' medial:  20.8 LV IVS:        1.00 cm      LV e' lateral:   0.06 cm/s LVOT diam:     2.00 cm      LV E/e' lateral: 15.6 LV SV:         84 LV SV Index:   46 LVOT Area:     3.14 cm  LV Volumes (MOD) LV vol d, MOD A2C: 78.2 ml LV vol d, MOD A4C: 109.0 ml LV vol s, MOD A2C: 38.1 ml LV vol s, MOD A4C: 52.5 ml LV SV MOD A2C:     40.1 ml LV SV MOD A4C:     109.0 ml LV SV MOD BP:      46.2 ml RIGHT VENTRICLE RV Basal diam:  4.45 cm RV Mid diam:    3.40 cm RV S prime:     9.90 cm/s TAPSE (M-mode): 2.7 cm LEFT ATRIUM             Index        RIGHT ATRIUM           Index LA diam:        4.30 cm 2.36 cm/m   RA Area:     20.80 cm LA Vol (A2C):   69.0 ml 37.83 ml/m  RA Volume:   70.70 ml  38.76 ml/m LA Vol (A4C):   94.3 ml 51.70 ml/m LA Biplane Vol: 85.5 ml 46.87 ml/m  AORTIC VALVE                     PULMONIC VALVE AV Area (Vmax):    2.30 cm      PV Vmax:       0.80 m/s AV Area (Vmean):   2.13 cm      PV Peak grad:  2.6 mmHg AV Area (VTI):     2.38 cm AV Vmax:           161.00 cm/s AV Vmean:          105.000 cm/s AV VTI:            0.352 m AV Peak Grad:      10.4 mmHg AV Mean Grad:      5.0 mmHg LVOT Vmax:         118.00 cm/s LVOT Vmean:        71.300 cm/s LVOT VTI:          0.267 m LVOT/AV VTI ratio: 0.76 AI PHT:            593 msec  AORTA Ao Root diam: 2.80 cm Ao Asc diam:  3.20 cm MITRAL VALVE                  TRICUSPID VALVE MV Area (PHT): 3.51 cm       TR Peak grad:   29.8 mmHg MV Area VTI:  2.03 cm       TR Vmax:        273.00 cm/s MV Peak grad:  10.5 mmHg MV Mean grad:  4.0 mmHg       SHUNTS MV Vmax:       1.62 m/s       Systemic VTI:  0.27 m MV Vmean:      96.0 cm/s      Systemic Diam: 2.00 cm MV Decel Time: 216 msec MR Peak grad:    127.2 mmHg MR Mean grad:    74.0 mmHg MR Vmax:         564.00 cm/s MR Vmean:        395.0 cm/s MR PISA:         1.90 cm MR PISA Eff ROA: 31 mm MR PISA Radius:  0.55 cm MV E velocity: 0.95 cm/s MV A  velocity: 125.00 cm/s MV E/A ratio:  0.01 Jenkins Rouge MD Electronically signed by Jenkins Rouge MD Signature Date/Time: 07/29/2021/12:45:35 PM    Final      Assessment:  GERD: overall doing well on pantoprazole daily. Reinforced antireflux measures.   IBS: intermittent constipation, usuing MOM as needed.   Chronic normocytic anemia: stable, likely from multiple myeloma, followed by Dr. Delton Coombes. No overt GI bleeding. Remote IDA in 2019.   Plan: Continue pantoprazole 4m daily. If needs regular medication for constipation, she can try miralax 17 grams daily. Otherwise occasional MOM ok.  Return to the office in six months or sooner if needed. We offered yearly follow up but patient feels most comfortable with six month follow up.

## 2021-08-02 ENCOUNTER — Other Ambulatory Visit: Payer: Self-pay

## 2021-08-02 ENCOUNTER — Encounter: Payer: Self-pay | Admitting: Gastroenterology

## 2021-08-02 ENCOUNTER — Ambulatory Visit (INDEPENDENT_AMBULATORY_CARE_PROVIDER_SITE_OTHER): Payer: Medicare Other | Admitting: Gastroenterology

## 2021-08-02 VITALS — BP 140/90 | HR 85 | Temp 97.1°F | Ht 63.0 in | Wt 166.2 lb

## 2021-08-02 DIAGNOSIS — K219 Gastro-esophageal reflux disease without esophagitis: Secondary | ICD-10-CM

## 2021-08-02 DIAGNOSIS — K581 Irritable bowel syndrome with constipation: Secondary | ICD-10-CM

## 2021-08-02 NOTE — Patient Instructions (Signed)
Continue pantoprazole once daily before breakfast.  ?Make sure you stay on top of your constipation. Do not go more than 2-3 days without a bowel movement before you take something. You can use Milk of Magnesia on occasional as you are. Otherwise if you need something regularly, you can try miralax one capful daily. ?Return to the office in six months or call sooner if needed. ?

## 2021-08-03 ENCOUNTER — Encounter: Payer: Self-pay | Admitting: Surgery

## 2021-08-03 ENCOUNTER — Ambulatory Visit (INDEPENDENT_AMBULATORY_CARE_PROVIDER_SITE_OTHER): Payer: Medicare Other | Admitting: Surgery

## 2021-08-03 VITALS — BP 127/78 | HR 75 | Temp 98.6°F | Resp 12 | Ht 63.0 in | Wt 161.0 lb

## 2021-08-03 DIAGNOSIS — C9000 Multiple myeloma not having achieved remission: Secondary | ICD-10-CM

## 2021-08-03 NOTE — Progress Notes (Signed)
[facility] History and Physical  Reason for Referral: Port-a-cath insertion Referring Physician: Dr. Ellin Saba  Chief Complaint   New Patient (Initial Visit)     Courtney Rogers is a 71 y.o. female.  HPI: Patient presents for Port-a-cath insertion.  She was recently diagnosed with multiple myeloma on March 2.  She is due to have her first infusion this Thursday.  She denies any history of neck or vascular surgeries.  Her surgical history significant for right knee replacement and hysterectomy.  Her medical history is significant for hypertension, hyperlipidemia, and PE in 2015 for which she is being treated with warfarin.  She denies use of tobacco products, alcohol, and illicit drugs.  Past Medical History:  Diagnosis Date   Acute myocardial infarction [facility] [place]) 2009   CAD/no stent medically managed   Anxiety disorder    CAD (coronary artery disease)    Hyperlipidemia    Hypertension    IBS (irritable bowel syndrome)    Non-ulcer dyspepsia 04/30/2009   Qualifier: Diagnosis of  By: Darrick Penna MD, Sandi L    Overactive bladder    PE (pulmonary embolism) JUN 2014   Presence of permanent cardiac pacemaker    Sleep apnea     Past Surgical History:  Procedure Laterality Date   ABDOMINAL HYSTERECTOMY     BSO secondary to cyst     CARDIAC CATHETERIZATION     COLONOSCOPY  12/2009   Dr. Aleene Davidson, propofol, normal. Next TCS 12/2019   COLONOSCOPY N/A 05/22/2017   Examined portion ileum was normal, significant looping of colon, external hemorrhoids and rectal bleeding due to internal hemorrhoids, mild diverticulosis procedure: COLONOSCOPY;  Surgeon: West Bali, MD;  [facility]: [facility];  Service: Endoscopy;  Laterality: N/A;  10:30am   ESOPHAGOGASTRODUODENOSCOPY  05/11/2009   schatzki ring/small hiatal hernia/path:gastritis   ESOPHAGOGASTRODUODENOSCOPY N/A 05/22/2017   Multiple gastric polyps with biopsy benign fundic gland polyp, small bowel biopsy negative for  celiac, gastric biopsy with mild gastritis but no H. pylori procedure: ESOPHAGOGASTRODUODENOSCOPY (EGD);  Surgeon: West Bali, MD;  [facility]: [facility];  Service: Endoscopy;  Laterality: N/A;   ESOPHAGOGASTRODUODENOSCOPY (EGD) WITH ESOPHAGEAL DILATION N/A 04/01/2013   WUJ:[id number] at the gastroesophageal juction/multiple small polyps/mild gastritis   GIVENS CAPSULE STUDY N/A 06/07/2017   Frequent gastric erosions, normal small bowel mucosa.  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    Family History  Problem Relation Age of Onset   Colon polyps Neg Hx    Colon cancer Neg Hx     Social History   Tobacco Use   Smoking status: Never   Smokeless tobacco: Never   Tobacco comments:    Never smoked   Vaping Use   Vaping Use: Never used  Substance Use Topics   Alcohol use: Yes    Alcohol/week: 0.0 standard drinks    Comment: occ wine   Drug use: No    Medications: I have reviewed the patient's current medications. Allergies as of 08/03/2021       Reactions   Amoxicillin-pot Clavulanate Other (See Comments)   Biaxin [clarithromycin] Other (See Comments)   Stomach problems    Erythromycin    Lisinopril Swelling        Medication List  Accurate as of August 03, 2021 11:17 AM. If you have any questions, ask your nurse or doctor.          acetaminophen 500 MG tablet Commonly known as: TYLENOL Take 1,000 mg by mouth 2 (two) times daily as needed for moderate pain or headache.   acyclovir 400 MG tablet Commonly known as: ZOVIRAX Take 1 tablet (400 mg total) by mouth 5 (five) times daily.   albuterol 108 (90 Base) MCG/ACT inhaler Commonly known as: VENTOLIN HFA Inhale 2 puffs into the lungs every 6 (six) hours as needed for shortness of  breath.   amLODipine 10 MG tablet Commonly known as: NORVASC Take 1 tablet (10 mg total) by mouth daily.   aspirin EC 81 MG tablet Take 81 mg by mouth at bedtime.   busPIRone 5 MG tablet Commonly known as: BUSPAR Take 5 mg by mouth daily.   CALCIUM 600+D PO Take 1 tablet by mouth 2 (two) times daily.   chlorthalidone 25 MG tablet Commonly known as: HYGROTON Take 25 mg by mouth daily.   cholecalciferol 25 MCG (1000 UNIT) tablet Commonly known as: VITAMIN D Take 1,000 Units by mouth daily.   fluticasone 50 MCG/ACT nasal spray Commonly known as: FLONASE Place 2 sprays into both nostrils daily.   fluticasone-salmeterol 250-50 MCG/ACT Aepb Commonly known as: ADVAIR Inhale 1 puff into the lungs 2 (two) times daily.   folic acid 1 MG tablet Commonly known as: FOLVITE TAKE 1 TABLET BY MOUTH ONCE DAILY.   gabapentin 600 MG tablet Commonly known as: NEURONTIN Take 600 mg by mouth 2 (two) times daily.   GAS-X PO Take 2 tablets by mouth daily as needed (gas).   GNP Probiotic Colon Support Caps Take 1 capsule by mouth daily.   lenalidomide 15 MG capsule Commonly known as: REVLIMID Take 1 capsule (15 mg total) by mouth daily. 21 days on, 7 days off every 28 days   lidocaine 2 % solution Commonly known as: XYLOCAINE TAKE 2 TEASPOONFULS BEFORE MEALS AND AT BEDTIME AS NEEDED MAY REPEAT EVERY 4 HOURS. (MAX OF 8 DOSES PER DAY)   losartan 100 MG tablet Commonly known as: COZAAR Take 100 mg by mouth daily.   metoprolol tartrate 50 MG tablet Commonly known as: LOPRESSOR Take 1 tablet (50 mg total) by mouth 2 (two) times daily.   mirtazapine 15 MG tablet Commonly known as: REMERON Take 15 mg by mouth at bedtime.   montelukast 10 MG tablet Commonly known as: SINGULAIR Take 10 mg by mouth daily.   nitroGLYCERIN 0.4 MG SL tablet Commonly known as: NITROSTAT Place 0.4 mg under the tongue every 5 (five) minutes as needed for chest pain.   pantoprazole 40 MG  tablet Commonly known as: PROTONIX Take 40 mg by mouth daily.   potassium chloride 10 MEQ tablet Commonly known as: KLOR-CON M Take 10 mEq by mouth daily.   primidone 50 MG tablet Commonly known as: MYSOLINE Take 50 mg by mouth daily.   PROBIOTIC PO Take 1 capsule by mouth daily. [facility].   sertraline 100 MG tablet Commonly known as: ZOLOFT Take 200 mg by mouth daily.   simvastatin 40 MG tablet Commonly known as: ZOCOR Take 40 mg by mouth at bedtime.   Vitamin B Complex Tabs Take 1 tablet by mouth daily.   vitamin B-12 1000 MCG tablet Commonly known as: CYANOCOBALAMIN Take 1,000 mcg by mouth daily.   vitamin C 500 MG tablet Commonly known as: ASCORBIC ACID Take 500 mg  by mouth daily.   warfarin 3 MG tablet Commonly known as: COUMADIN Take by mouth.   Xolido 2 % Crea Generic drug: Lidocaine HCl Apply 1 application topically daily as needed (arthritisis).         ROS:  Constitutional: negative for chills, fatigue, and fevers Eyes: negative for visual disturbance and pain Ears, nose, mouth, throat, and face: positive for sinus problems, negative for ear drainage and sore throat Respiratory: negative for cough, wheezing, and shortness of breath Cardiovascular: negative for chest pain and palpitations Gastrointestinal: negative for abdominal pain, nausea, reflux symptoms, and vomiting Genitourinary:negative for dysuria, frequency, and urinary retention Integument/breast: positive for dryness, negative for rash Hematologic/lymphatic: negative for bleeding and lymphadenopathy Musculoskeletal:positive for back pain, neck pain, and joint pain Neurological: positive for tremors, negative for dizziness and numbness Endocrine: negative for temperature intolerance  Blood pressure 127/78, pulse 75, temperature 98.6 F (37 C), temperature source Other (Comment), resp. rate 12, height 5\' 3"  (1.6 m), weight 161 lb (73 kg), SpO2 94 %. Physical Exam Vitals  reviewed.  Constitutional:      Appearance: Normal appearance.  HENT:     Head: Normocephalic and atraumatic.  Eyes:     Extraocular Movements: Extraocular movements intact.     Pupils: Pupils are equal, round, and reactive to light.  Cardiovascular:     Rate and Rhythm: Normal rate.  Pulmonary:     Effort: Pulmonary effort is normal.  Abdominal:     General: There is no distension.     Palpations: Abdomen is soft.     Tenderness: There is no abdominal tenderness.  Musculoskeletal:        General: Normal range of motion.     Cervical back: Normal range of motion. No tenderness.  Lymphadenopathy:     Cervical: No cervical adenopathy.  Skin:    General: Skin is warm and dry.  Neurological:     General: No focal deficit present.     Mental Status: She is alert and oriented to person, place, and time.  Psychiatric:        Mood and Affect: Mood normal.        Behavior: Behavior normal.    Results: No results found for this or any previous visit (from the past 48 hour(s)).  No results found.   Assessment & Plan:  Courtney Rogers is a 71 y.o. female who presents to discuss Port-A-Cath insertion  -I discussed the procedure for a right internal jugular Port-A-Cath insertion.  The risk and benefits of this right IJ Port-A-Cath insertion were discussed, including but not limited to bleeding, infection, injury to surrounding structures, pneumothorax, and need for additional procedure.  After careful consideration, Courtney Rogers has decided to proceed with this procedure. -I explained that she needs to hold her warfarin for 7 days prior to the procedure to decrease risk of bleeding -Patient tentatively scheduled for right IJ Port-A-Cath insertion on 3/24  All questions were answered to the satisfaction of the patient and family.   Theophilus Kinds, DO [facility] [address] [place], [place] [address] [phone] (office)

## 2021-08-03 NOTE — Patient Instructions (Signed)
[facility] ?Chemotherapy Teaching ? ? ?You will see the doctor regularly throughout treatment.  We will obtain blood work from you prior to every treatment and monitor your results to make sure it is safe to give your treatment. The doctor monitors your response to treatment by the way you are feeling, your blood work, and by obtaining scans periodically.  There will be wait times while you are here for treatment.  It will take about 30 minutes to 1 hour for your lab work to result.  Then there will be wait times while pharmacy mixes your medications.  ? ? ?Daratumumab (Darzalex Faspro?) ? ?About This Drug ?Daratumumab is used to treat cancer. It is given under the skin in your abdomen (subcutaneously). ? ?Possible Side Effects ? ? You may have a reaction to the drug. Sometimes you may be given medication to stop or lessen these side effects. Your nurse will check you closely for these signs: fever or shaking chills, flushing, facial swelling, feeling dizzy, headache, trouble breathing, rash, itching, chest tightness, or chest pain. These reactions may happen after your infusion. If this happens, call 911 for emergency care. ? ? Decrease in the number of white blood cells and platelets. This may raise your risk of infection, and raise your risk of bleeding. ? ? Fever and chills ? ? Tiredness ? ? Feeling dizzy ? ? Trouble sleeping ? ? Cough and trouble breathing ? ? Upper respiratory infection ? ? Nausea and throwing up (vomiting) ? ? Loose bowel movements (diarrhea) ? ? Constipation (not able to move bowels) ? ? Muscle spasms ? ? Pain in the joints ? ? Back pain ? ? Swelling of your legs, ankles and/or feet ? ? Effects on the nerves are called peripheral neuropathy. You may feel numbness, tingling, or pain in your hands and feet. It may be hard for you to button your clothes, open jars, or walk as usual. The effect on the nerves may get worse with more doses of the drug. These effects get better in  some people after the drug is stopped but it does not get better in all people. ? ?Note: Each of the side effects above was reported in 20% or greater of patients treated with daratumumab. Not all possible side effects are included above. ? ?Warnings and Precautions ? ? Severe decrease in the number of white blood cells and platelets ? ?  Severe allergic reaction ? ? This medication can affect the results of blood tests that match your blood type. Your blood type will be tested before treatment. Be sure to tell all healthcare providers you are taking this medicine before receiving blood transfusions, even for 6 months after your last dose. ? ?Important Information ? ? This drug may be present in the saliva, tears, sweat, urine, stool, vomit, semen, and vaginal secretions. Talk to your doctor and/or your nurse about the necessary precautions to take during this time. ? ?Treating Side Effects ? ? Drink plenty of fluids (a minimum of eight glasses per day is recommended). ? ? If you throw up or have loose bowel movements, you should drink more fluids so that you do not become dehydrated (lack of water in the body from losing too much fluid). ? ? To help with nausea and vomiting, eat small, frequent meals instead of three large meals a day. Choose foods and drinks that are at room temperature. Ask your nurse or doctor about other helpful tips and medicine that is available  to help stop or lessen these symptoms. ? ? If you have diarrhea, eat low-fiber foods that are high in protein and calories and avoid foods that can irritate your digestive tracts or lead to cramping. ? ? Ask your nurse or doctor about medicine that can lessen or stop your diarrhea or constipation. ? ? If you are not able to move your bowels, check with your doctor or nurse before you use enemas, laxatives, or suppositories. ? ? Manage tiredness by pacing your activities for the day. ? ? Be sure to include periods of rest between energy-draining  activities. ? ? To decrease the risk of infection, wash your hands regularly. ? ? Avoid close contact with people who have a cold, the flu, or other infections. ? ? Take your temperature as your doctor or nurse tells you, and whenever you feel like you may have a fever. ? ? To help decrease the risk of bleeding, use a soft toothbrush. Check with your nurse before using dental floss. ? ? Be very careful when using knives or tools. ? ? Use an electric shaver instead of a razor. ? ? Keeping your pain under control is important to your well-being. Please tell your doctor or nurse if you are experiencing pain. ? ? If you are dizzy, get up slowly after sitting or lying. ? ? If you are having trouble sleeping, talk to your nurse or doctor on tips to help you sleep better. ? ? If you have numbness and tingling in your hands and feet, be careful when cooking, walking, and handling sharp objects and hot liquids. ? ? Infusion reactions may rarely occur after your infusion. If this happens, call 911 for emergency care. ? ?Food and Drug Interactions ? ? There are no known interactions of daratumumab with food. ? ? This drug may interact with other medicines. Tell your doctor and pharmacist about all the prescription and over-the-counter medicines and dietary supplements (vitamins, minerals, herbs and others) that you are taking at this time. Also, check with your doctor or pharmacist before starting any new prescription or over-the-counter medicines, or dietary supplements to make sure that there are no interactions. ? ?When to Call the Doctor ? ?Call your doctor or nurse if you have any of these symptoms and/or any new or unusual symptoms: ? ? Fever of 100.4? F (38? C) or higher ? ? Chills ? ? Pain in your chest ? ? Coughing up yellow, green, or bloody mucus. ? ? Wheezing or trouble breathing ? ? Tiredness that interferes with your daily activities ? ? Trouble falling or staying asleep ? ? Feeling dizzy or lightheaded ? ? Easy  bleeding or bruising ? ? Nausea that stops you from eating or drinking and/or is not relieved by prescribed medicines ? ? Vomiting ? ? No bowel movement in 3 days or when you feel uncomfortable. ? ? Loose bowel movements (diarrhea) 4 times a day or loose bowel movements with lack of strength or a feeling of being dizzy ? ? Weight gain of 5 pounds in one week (fluid retention) ? ? Swelling of your legs, ankles and/or feet ? ? Pain that does not go away, or is not relieved by prescribed medicines ? ? Signs of infusion reaction: fever or shaking chills, flushing, facial swelling, feeling dizzy, headache, trouble breathing, rash, itching, chest tightness, or chest pain. If this happens, call 911 for emergency care. ? ? Numbness, tingling, or pain in your hands and feet ? ? If you  think you may be pregnant or may have impregnated your partner ? ?Reproduction Warnings ? ? Pregnancy warning: This drug may have harmful effects on the unborn baby. Women of childbearing potential should use effective methods of birth control during your cancer treatment and for at least 3 months after treatment. Let your doctor know right away if you think you may be pregnant. ? ? Breastfeeding warning: It is not known if this drug passes into breast milk. For this reason, women should talk to their doctor about the risks and benefits of breastfeeding during treatment with this drug because this drug may enter the breast milk and cause harm to a breastfeeding baby. ? ? Fertility warning: Human fertility studies have not been done with this drug. Talk with your doctor or nurse if you plan to have children. Ask for information on sperm or egg banking. ? ? ?Bortezomib (Velcade?) ? ?About This Drug ? ?Bortezomib is used to treat cancer. It is given in the vein (IV) or by a shot under the skin (subcutaneously).  You will receive this injection under your skin. ? ?Possible Side Effects ? ? Bone marrow suppression. Decrease in the number of white  blood cells, red blood cells, and platelets. This may raise your risk of infection, make you tired and weak (fatigue), and raise your risk of bleeding. ? ? Nausea and vomiting (throwing up) ? ? Constipation (not

## 2021-08-04 ENCOUNTER — Other Ambulatory Visit (HOSPITAL_COMMUNITY): Payer: Self-pay | Admitting: Hematology

## 2021-08-04 NOTE — H&P (Signed)
[facility] History and Physical ? ?Reason for Referral: Port-a-cath insertion ?Referring Physician: Dr. Katragadda ? ?Chief Complaint   ?New Patient (Initial Visit) ?  ? ? ?Courtney Rogers is a 71 y.o. female.  ?HPI: Patient presents for Port-a-cath insertion.  She was recently diagnosed with multiple myeloma on March 2.  She is due to have her first infusion this Thursday.  She denies any history of neck or vascular surgeries.  Her surgical history significant for right knee replacement and hysterectomy.  Her medical history is significant for hypertension, hyperlipidemia, and PE in 2015 for which she is being treated with warfarin.  She denies use of tobacco products, alcohol, and illicit drugs. ? ?Past Medical History:  ?Diagnosis Date  ? Acute myocardial infarction (HCC) 2009  ? CAD/no stent medically managed  ? Anxiety disorder   ? CAD (coronary artery disease)   ? Hyperlipidemia   ? Hypertension   ? IBS (irritable bowel syndrome)   ? Non-ulcer dyspepsia 04/30/2009  ? Qualifier: Diagnosis of  By: Fields MD, Sandi L   ? Overactive bladder   ? PE (pulmonary embolism) JUN 2014  ? Presence of permanent cardiac pacemaker   ? Sleep apnea   ? ? ?Past Surgical History:  ?Procedure Laterality Date  ? ABDOMINAL HYSTERECTOMY    ? BSO secondary to cyst    ? CARDIAC CATHETERIZATION    ? COLONOSCOPY  12/2009  ? Dr. Spainhour, propofol, normal. Next TCS 12/2019  ? COLONOSCOPY N/A 05/22/2017  ? Examined portion ileum was normal, significant looping of colon, external hemorrhoids and rectal bleeding due to internal hemorrhoids, mild diverticulosis procedure: COLONOSCOPY;  Surgeon: Fields, Sandi L, MD;  Location: [facility];  Service: Endoscopy;  Laterality: N/A;  10:30am  ? ESOPHAGOGASTRODUODENOSCOPY  05/11/2009  ? schatzki ring/small hiatal hernia/path:gastritis  ? ESOPHAGOGASTRODUODENOSCOPY N/A 05/22/2017  ? Multiple gastric polyps with biopsy benign fundic gland polyp, small bowel biopsy negative for  celiac, gastric biopsy with mild gastritis but no H. pylori procedure: ESOPHAGOGASTRODUODENOSCOPY (EGD);  Surgeon: Fields, Sandi L, MD;  Location: [facility];  Service: Endoscopy;  Laterality: N/A;  ? ESOPHAGOGASTRODUODENOSCOPY (EGD) WITH ESOPHAGEAL DILATION N/A 04/01/2013  ? SLF:stricture at the gastroesophageal juction/multiple small polyps/mild gastritis  ? GIVENS CAPSULE STUDY N/A 06/07/2017  ? Frequent gastric erosions, normal small bowel mucosa.  Procedure: GIVENS CAPSULE STUDY;  Surgeon: Fields, Sandi L, MD;  Location: [facility];  Service: Endoscopy;  Laterality: N/A;  7:30am  ? INSERT / REPLACE / REMOVE PACEMAKER    ? Last year per pt.(can't remember date)  ? 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 ? ?Family History  ?Problem Relation Age of Onset  ? Colon polyps Neg Hx   ? Colon cancer Neg Hx   ? ? ?Social History  ? ?Tobacco Use  ? Smoking status: Never  ? Smokeless tobacco: Never  ? Tobacco comments:  ?  Never smoked   ?Vaping Use  ? Vaping Use: Never used  ?Substance Use Topics  ? Alcohol use: Yes  ?  Alcohol/week: 0.0 standard drinks  ?  Comment: occ wine  ? Drug use: No  ? ? ?Medications: I have reviewed the patient's current medications. ?Allergies as of 08/03/2021   ? ?   Reactions  ? Amoxicillin-pot Clavulanate Other (See Comments)  ? Biaxin [clarithromycin] Other (See Comments)  ? Stomach problems   ? Erythromycin   ? Lisinopril Swelling  ? ?  ? ?  ?Medication List  ?  ? ?  ?   Accurate as of August 03, 2021 11:17 AM. If you have any questions, ask your nurse or doctor.  ?  ?  ? ?  ? ?acetaminophen 500 MG tablet ?Commonly known as: TYLENOL ?Take 1,000 mg by mouth 2 (two) times daily as needed for moderate pain or headache. ?  ?acyclovir 400 MG tablet ?Commonly known as: ZOVIRAX ?Take 1 tablet (400 mg total) by mouth 5 (five) times daily. ?  ?albuterol 108 (90 Base) MCG/ACT inhaler ?Commonly known as: VENTOLIN HFA ?Inhale 2 puffs into the lungs every 6 (six) hours as needed for shortness of  breath. ?  ?amLODipine 10 MG tablet ?Commonly known as: NORVASC ?Take 1 tablet (10 mg total) by mouth daily. ?  ?aspirin EC 81 MG tablet ?Take 81 mg by mouth at bedtime. ?  ?busPIRone 5 MG tablet ?Commonly known as: BUSPAR ?Take 5 mg by mouth daily. ?  ?CALCIUM 600+D PO ?Take 1 tablet by mouth 2 (two) times daily. ?  ?chlorthalidone 25 MG tablet ?Commonly known as: HYGROTON ?Take 25 mg by mouth daily. ?  ?cholecalciferol 25 MCG (1000 UNIT) tablet ?Commonly known as: VITAMIN D ?Take 1,000 Units by mouth daily. ?  ?fluticasone 50 MCG/ACT nasal spray ?Commonly known as: FLONASE ?Place 2 sprays into both nostrils daily. ?  ?fluticasone-salmeterol 250-50 MCG/ACT Aepb ?Commonly known as: ADVAIR ?Inhale 1 puff into the lungs 2 (two) times daily. ?  ?folic acid 1 MG tablet ?Commonly known as: FOLVITE ?TAKE 1 TABLET BY MOUTH ONCE DAILY. ?  ?gabapentin 600 MG tablet ?Commonly known as: NEURONTIN ?Take 600 mg by mouth 2 (two) times daily. ?  ?GAS-X PO ?Take 2 tablets by mouth daily as needed (gas). ?  ?GNP Probiotic Colon Support Caps ?Take 1 capsule by mouth daily. ?  ?lenalidomide 15 MG capsule ?Commonly known as: REVLIMID ?Take 1 capsule (15 mg total) by mouth daily. 21 days on, 7 days off every 28 days ?  ?lidocaine 2 % solution ?Commonly known as: XYLOCAINE ?TAKE 2 TEASPOONFULS BEFORE MEALS AND AT BEDTIME AS NEEDED MAY REPEAT EVERY 4 HOURS. (MAX OF 8 DOSES PER DAY) ?  ?losartan 100 MG tablet ?Commonly known as: COZAAR ?Take 100 mg by mouth daily. ?  ?metoprolol tartrate 50 MG tablet ?Commonly known as: LOPRESSOR ?Take 1 tablet (50 mg total) by mouth 2 (two) times daily. ?  ?mirtazapine 15 MG tablet ?Commonly known as: REMERON ?Take 15 mg by mouth at bedtime. ?  ?montelukast 10 MG tablet ?Commonly known as: SINGULAIR ?Take 10 mg by mouth daily. ?  ?nitroGLYCERIN 0.4 MG SL tablet ?Commonly known as: NITROSTAT ?Place 0.4 mg under the tongue every 5 (five) minutes as needed for chest pain. ?  ?pantoprazole 40 MG  tablet ?Commonly known as: PROTONIX ?Take 40 mg by mouth daily. ?  ?potassium chloride 10 MEQ tablet ?Commonly known as: KLOR-CON M ?Take 10 mEq by mouth daily. ?  ?primidone 50 MG tablet ?Commonly known as: MYSOLINE ?Take 50 mg by mouth daily. ?  ?PROBIOTIC PO ?Take 1 capsule by mouth daily. [facility]. ?  ?sertraline 100 MG tablet ?Commonly known as: ZOLOFT ?Take 200 mg by mouth daily. ?  ?simvastatin 40 MG tablet ?Commonly known as: ZOCOR ?Take 40 mg by mouth at bedtime. ?  ?Vitamin B Complex Tabs ?Take 1 tablet by mouth daily. ?  ?vitamin B-12 1000 MCG tablet ?Commonly known as: CYANOCOBALAMIN ?Take 1,000 mcg by mouth daily. ?  ?vitamin C 500 MG tablet ?Commonly known as: ASCORBIC ACID ?Take 500 mg   by mouth daily. ?  ?warfarin 3 MG tablet ?Commonly known as: COUMADIN ?Take by mouth. ?  ?Xolido 2 % Crea ?Generic drug: Lidocaine HCl ?Apply 1 application topically daily as needed (arthritisis). ?  ? ?  ? ? ? ?ROS:  ?Constitutional: negative for chills, fatigue, and fevers ?Eyes: negative for visual disturbance and pain ?Ears, nose, mouth, throat, and face: positive for sinus problems, negative for ear drainage and sore throat ?Respiratory: negative for cough, wheezing, and shortness of breath ?Cardiovascular: negative for chest pain and palpitations ?Gastrointestinal: negative for abdominal pain, nausea, reflux symptoms, and vomiting ?Genitourinary:negative for dysuria, frequency, and urinary retention ?Integument/breast: positive for dryness, negative for rash ?Hematologic/lymphatic: negative for bleeding and lymphadenopathy ?Musculoskeletal:positive for back pain, neck pain, and joint pain ?Neurological: positive for tremors, negative for dizziness and numbness ?Endocrine: negative for temperature intolerance ? ?Blood pressure 127/78, pulse 75, temperature 98.6 ?F (37 ?C), temperature source Other (Comment), resp. rate 12, height 5' 3" (1.6 m), weight 161 lb (73 kg), SpO2 94 %. ?Physical Exam ?Vitals  reviewed.  ?Constitutional:   ?   Appearance: Normal appearance.  ?HENT:  ?   Head: Normocephalic and atraumatic.  ?Eyes:  ?   Extraocular Movements: Extraocular movements intact.  ?   Pupils: Pupils are equal, round, and

## 2021-08-04 NOTE — Progress Notes (Signed)
START ON PATHWAY REGIMEN - Multiple Myeloma and Other Plasma Cell Dyscrasias ? ? ?DaraVRd (Daratumumab SUBQ + Bortezomib SUBQ + Lenalidomide PO + Dexamethasone IV/PO) q21 Days (Induction Schema): ?  A cycle is every 21 days: ?    Lenalidomide  ?    Dexamethasone  ?    Bortezomib  ?    Daratumumab and hyaluronidase-fihj  ? ? ?DaraVRd (Daratumumab SUBQ + Bortezomib SUBQ + Lenalidomide PO + Dexamethasone IV/PO) q21 Days (Consolidation Schema): ?  A cycle is every 21 days: ?    Lenalidomide  ?    Dexamethasone  ?    Bortezomib  ?    Daratumumab and hyaluronidase-fihj  ? ?**Always confirm dose/schedule in your pharmacy ordering system** ? ?Patient Characteristics: ?Multiple Myeloma, Newly Diagnosed, Transplant Eligible, High Risk ?Disease Classification: Multiple Myeloma ?R-ISS Staging: II ?Therapeutic Status: Newly Diagnosed ?Is Patient Eligible for Transplant<= Transplant Eligible ?Risk Status: High Risk ?Intent of Therapy: ?Non-Curative / Palliative Intent, Discussed with Patient ?

## 2021-08-04 NOTE — Progress Notes (Signed)
Pharmacist Chemotherapy Monitoring - Initial Assessment   ? ?Anticipated start date: 08/10/21  ? ?The following has been reviewed per standard work regarding the patient's treatment regimen: ?The patient's diagnosis, treatment plan and drug doses, and organ/hematologic function ?Lab orders and baseline tests specific to treatment regimen  ?The treatment plan start date, drug sequencing, and pre-medications ?Prior authorization status  ?Patient's documented medication list, including drug-drug interaction screen and prescriptions for anti-emetics and supportive care specific to the treatment regimen ?The drug concentrations, fluid compatibility, administration routes, and timing of the medications to be used ?The patient's access for treatment and lifetime cumulative dose history, if applicable  ?The patient's medication allergies and previous infusion related reactions, if applicable  ? ?Changes made to treatment plan:  ?treatment plan date ? ?Follow up needed:  ?Pending authorization for treatment  and Labs - phenotype and type and cross need ordered.  Chemo education class is Monday RN will release. ? ? ?Wynona Neat, [facility], ?08/04/2021  2:29 PM ? ?

## 2021-08-05 ENCOUNTER — Other Ambulatory Visit (HOSPITAL_COMMUNITY): Payer: BC Managed Care – PPO

## 2021-08-05 ENCOUNTER — Encounter (HOSPITAL_COMMUNITY): Payer: Self-pay | Admitting: Hematology

## 2021-08-05 ENCOUNTER — Ambulatory Visit (HOSPITAL_COMMUNITY): Payer: Medicare Other

## 2021-08-05 ENCOUNTER — Ambulatory Visit (HOSPITAL_COMMUNITY): Payer: Medicare Other | Admitting: Hematology

## 2021-08-05 ENCOUNTER — Ambulatory Visit (HOSPITAL_COMMUNITY): Payer: BC Managed Care – PPO

## 2021-08-05 ENCOUNTER — Encounter (HOSPITAL_COMMUNITY): Payer: Self-pay | Admitting: Adult Health

## 2021-08-09 ENCOUNTER — Other Ambulatory Visit (HOSPITAL_COMMUNITY): Payer: Self-pay

## 2021-08-09 MED ORDER — ACYCLOVIR 400 MG PO TABS: 400.0000 mg | ORAL_TABLET | Freq: Two times a day (BID) | ORAL | 6 refills | Status: DC

## 2021-08-09 MED ORDER — PROCHLORPERAZINE MALEATE 10 MG PO TABS: 10.0000 mg | ORAL_TABLET | Freq: Four times a day (QID) | ORAL | 1 refills | Status: DC | PRN

## 2021-08-09 NOTE — Patient Instructions (Addendum)
[place] ?Chemotherapy Teaching ? ? ?You are diagnosed with high risk multiple myeloma.  There is a pill called Revlimid you will take at home as part of your treatment.  You will take this pill once a day for 14 days, then have a 1 week break before resuming taking the pills once a day for 14 days. You will initially be treated in the clinic twice weekly with two injections - Velcade and Darzalex.  On the first day of treatment each week you will receive both injections.  On the second day of treatment each week you will receive only Velcade.  We will do this for several cycles and then space out your treatments, so you won't be coming twice weekly, but every other week to receive both injections. Then after doing this for several cycles, your treatment will be once a month.  The intent of treatment is to control this disease and alleviate any symptoms it may be causing you. You will see the doctor regularly throughout treatment.  We will obtain blood work from you prior to every treatment and monitor your results to make sure it is safe to give your treatment. The doctor monitors your response to treatment by the way you are feeling, your blood work, and by obtaining scans periodically.  There will be wait times while you are here for treatment.  It will take about 30 minutes to 1 hour for your lab work to result.  Then there will be wait times while pharmacy mixes your medications.  ? ? ?Daratumumab (Darzalex Faspro?) ? ?About This Drug ?Daratumumab is used to treat cancer. It is given under the skin in your abdomen (subcutaneously). ? ?Possible Side Effects ? ? You may have a reaction to the drug. Sometimes you may be given medication to stop or lessen these side effects. Your nurse will check you closely for these signs: fever or shaking chills, flushing, facial swelling, feeling dizzy, headache, trouble breathing, rash, itching, chest tightness, or chest pain. These reactions may happen after  your infusion. If this happens, call 911 for emergency care. ? ? Decrease in the number of white blood cells and platelets. This may raise your risk of infection, and raise your risk of bleeding. ? ? Fever and chills ? ? Tiredness ? ? Feeling dizzy ? ? Trouble sleeping ? ? Cough and trouble breathing ? ? Upper respiratory infection ? ? Nausea and throwing up (vomiting) ? ? Loose bowel movements (diarrhea) ? ? Constipation (not able to move bowels) ? ? Muscle spasms ? ? Pain in the joints ? ? Back pain ? ? Swelling of your legs, ankles and/or feet ? ? Effects on the nerves are called peripheral neuropathy. You may feel numbness, tingling, or pain in your hands and feet. It may be hard for you to button your clothes, open jars, or walk as usual. The effect on the nerves may get worse with more doses of the drug. These effects get better in some people after the drug is stopped but it does not get better in all people. ? ?Note: Each of the side effects above was reported in 20% or greater of patients treated with daratumumab. Not all possible side effects are included above. ? ?Warnings and Precautions ? ? Severe decrease in the number of white blood cells and platelets ? ?  Severe allergic reaction ? ? This medication can affect the results of blood tests that match your blood type. Your blood type will  be tested before treatment. Be sure to tell all healthcare providers you are taking this medicine before receiving blood transfusions, even for 6 months after your last dose. ? ?Important Information ? ? This drug may be present in the saliva, tears, sweat, urine, stool, vomit, semen, and vaginal secretions. Talk to your doctor and/or your nurse about the necessary precautions to take during this time. ? ?Treating Side Effects ? ? Drink plenty of fluids (a minimum of eight glasses per day is recommended). ? ? If you throw up or have loose bowel movements, you should drink more fluids so that you do not become  dehydrated (lack of water in the body from losing too much fluid). ? ? To help with nausea and vomiting, eat small, frequent meals instead of three large meals a day. Choose foods and drinks that are at room temperature. Ask your nurse or doctor about other helpful tips and medicine that is available to help stop or lessen these symptoms. ? ? If you have diarrhea, eat low-fiber foods that are high in protein and calories and avoid foods that can irritate your digestive tracts or lead to cramping. ? ? Ask your nurse or doctor about medicine that can lessen or stop your diarrhea or constipation. ? ? If you are not able to move your bowels, check with your doctor or nurse before you use enemas, laxatives, or suppositories. ? ? Manage tiredness by pacing your activities for the day. ? ? Be sure to include periods of rest between energy-draining activities. ? ? To decrease the risk of infection, wash your hands regularly. ? ? Avoid close contact with people who have a cold, the flu, or other infections. ? ? Take your temperature as your doctor or nurse tells you, and whenever you feel like you may have a fever. ? ? To help decrease the risk of bleeding, use a soft toothbrush. Check with your nurse before using dental floss. ? ? Be very careful when using knives or tools. ? ? Use an electric shaver instead of a razor. ? ? Keeping your pain under control is important to your well-being. Please tell your doctor or nurse if you are experiencing pain. ? ? If you are dizzy, get up slowly after sitting or lying. ? ? If you are having trouble sleeping, talk to your nurse or doctor on tips to help you sleep better. ? ? If you have numbness and tingling in your hands and feet, be careful when cooking, walking, and handling sharp objects and hot liquids. ? ? Infusion reactions may rarely occur after your infusion. If this happens, call 911 for emergency care. ? ?Food and Drug Interactions ? ? There are no known interactions of  daratumumab with food. ? ? This drug may interact with other medicines. Tell your doctor and pharmacist about all the prescription and over-the-counter medicines and dietary supplements (vitamins, minerals, herbs and others) that you are taking at this time. Also, check with your doctor or pharmacist before starting any new prescription or over-the-counter medicines, or dietary supplements to make sure that there are no interactions. ? ?When to Call the Doctor ? ?Call your doctor or nurse if you have any of these symptoms and/or any new or unusual symptoms: ? ? Fever of 100.4? F (38? C) or higher ? ? Chills ? ? Pain in your chest ? ? Coughing up yellow, green, or bloody mucus. ? ? Wheezing or trouble breathing ? ? Tiredness that interferes with your daily  activities ? ? Trouble falling or staying asleep ? ? Feeling dizzy or lightheaded ? ? Easy bleeding or bruising ? ? Nausea that stops you from eating or drinking and/or is not relieved by prescribed medicines ? ? Vomiting ? ? No bowel movement in 3 days or when you feel uncomfortable. ? ? Loose bowel movements (diarrhea) 4 times a day or loose bowel movements with lack of strength or a feeling of being dizzy ? ? Weight gain of 5 pounds in one week (fluid retention) ? ? Swelling of your legs, ankles and/or feet ? ? Pain that does not go away, or is not relieved by prescribed medicines ? ? Signs of infusion reaction: fever or shaking chills, flushing, facial swelling, feeling dizzy, headache, trouble breathing, rash, itching, chest tightness, or chest pain. If this happens, call 911 for emergency care. ? ? Numbness, tingling, or pain in your hands and feet ? ? If you think you may be pregnant or may have impregnated your partner ? ?Reproduction Warnings ? ? Pregnancy warning: This drug may have harmful effects on the unborn baby. Women of childbearing potential should use effective methods of birth control during your cancer treatment and for at least 3 months after  treatment. Let your doctor know right away if you think you may be pregnant. ? ? Breastfeeding warning: It is not known if this drug passes into breast milk. For this reason, women should talk to their doctor about

## 2021-08-09 NOTE — Telephone Encounter (Signed)
Aeronautical engineer to check status of Revlimid.  Rep stated that they spoke with the patient today and scheduled delivery for 08/10/21. ? ?Dennison Nancy CPHT ?Specialty Pharmacy Patient Advocate ?[place] ?Phone [phone] ?Fax [phone] ?08/09/2021 2:03 PM ? ?

## 2021-08-10 ENCOUNTER — Other Ambulatory Visit: Payer: Self-pay

## 2021-08-10 ENCOUNTER — Inpatient Hospital Stay (HOSPITAL_COMMUNITY): Payer: Medicare Other

## 2021-08-10 ENCOUNTER — Inpatient Hospital Stay (HOSPITAL_BASED_OUTPATIENT_CLINIC_OR_DEPARTMENT_OTHER): Payer: Medicare Other | Admitting: Hematology

## 2021-08-10 VITALS — BP 153/82 | HR 80 | Temp 97.1°F | Resp 18 | Wt 165.5 lb

## 2021-08-10 DIAGNOSIS — C9000 Multiple myeloma not having achieved remission: Secondary | ICD-10-CM

## 2021-08-10 LAB — TYPE AND SCREEN
ABO/RH(D): B POS
Antibody Screen: NEGATIVE

## 2021-08-10 LAB — CBC WITH DIFFERENTIAL/PLATELET
Abs Immature Granulocytes: 0.06 10*3/uL (ref 0.00–0.07)
Basophils Absolute: 0 10*3/uL (ref 0.0–0.1)
Basophils Relative: 1 %
Eosinophils Absolute: 0.2 10*3/uL (ref 0.0–0.5)
Eosinophils Relative: 3 %
HCT: 31.8 % — ABNORMAL LOW (ref 36.0–46.0)
Hemoglobin: 10.2 g/dL — ABNORMAL LOW (ref 12.0–15.0)
Immature Granulocytes: 1 %
Lymphocytes Relative: 24 %
Lymphs Abs: 1.5 10*3/uL (ref 0.7–4.0)
MCH: 32.8 pg (ref 26.0–34.0)
MCHC: 32.1 g/dL (ref 30.0–36.0)
MCV: 102.3 fL — ABNORMAL HIGH (ref 80.0–100.0)
Monocytes Absolute: 0.5 10*3/uL (ref 0.1–1.0)
Monocytes Relative: 8 %
Neutro Abs: 4.2 10*3/uL (ref 1.7–7.7)
Neutrophils Relative %: 63 %
Platelets: 123 10*3/uL — ABNORMAL LOW (ref 150–400)
RBC: 3.11 MIL/uL — ABNORMAL LOW (ref 3.87–5.11)
RDW: 15.7 % — ABNORMAL HIGH (ref 11.5–15.5)
WBC: 6.5 10*3/uL (ref 4.0–10.5)
nRBC: 0 % (ref 0.0–0.2)

## 2021-08-10 LAB — COMPREHENSIVE METABOLIC PANEL
ALT: 12 U/L (ref 0–44)
AST: 21 U/L (ref 15–41)
Albumin: 3.7 g/dL (ref 3.5–5.0)
Alkaline Phosphatase: 61 U/L (ref 38–126)
Anion gap: 11 (ref 5–15)
BUN: 16 mg/dL (ref 8–23)
CO2: 29 mmol/L (ref 22–32)
Calcium: 9.5 mg/dL (ref 8.9–10.3)
Chloride: 99 mmol/L (ref 98–111)
Creatinine, Ser: 0.96 mg/dL (ref 0.44–1.00)
GFR, Estimated: >60 mL/min (ref >60)
Glucose, Bld: 99 mg/dL (ref 70–99)
Potassium: 4 mmol/L (ref 3.5–5.1)
Sodium: 139 mmol/L (ref 135–145)
Total Bilirubin: 0.4 mg/dL (ref 0.3–1.2)
Total Protein: 10.8 g/dL — ABNORMAL HIGH (ref 6.5–8.1)

## 2021-08-10 LAB — LACTATE DEHYDROGENASE: LDH: 130 U/L (ref 98–192)

## 2021-08-10 LAB — MAGNESIUM: Magnesium: 1.9 mg/dL (ref 1.7–2.4)

## 2021-08-10 MED ORDER — ACETAMINOPHEN 325 MG PO TABS
650.0000 mg | ORAL_TABLET | Freq: Once | ORAL | Status: AC
  Administered 2021-08-10: 650 mg via ORAL
  Filled 2021-08-10: qty 2

## 2021-08-10 MED ORDER — DARATUMUMAB-HYALURONIDASE-FIHJ 1800-30000 MG-UT/15ML ~~LOC~~ SOLN
1800.0000 mg | Freq: Once | SUBCUTANEOUS | Status: AC
  Administered 2021-08-10: 1800 mg via SUBCUTANEOUS
  Filled 2021-08-10: qty 15

## 2021-08-10 MED ORDER — BORTEZOMIB CHEMO SQ INJECTION 3.5 MG (2.5MG/ML)
1.3000 mg/m2 | Freq: Once | INTRAMUSCULAR | Status: AC
  Administered 2021-08-10: 2.25 mg via SUBCUTANEOUS
  Filled 2021-08-10: qty 0.9

## 2021-08-10 MED ORDER — DEXAMETHASONE 4 MG PO TABS
20.0000 mg | ORAL_TABLET | Freq: Once | ORAL | Status: AC
  Administered 2021-08-10: 20 mg via ORAL
  Filled 2021-08-10: qty 5

## 2021-08-10 MED ORDER — DIPHENHYDRAMINE HCL 25 MG PO CAPS
50.0000 mg | ORAL_CAPSULE | Freq: Once | ORAL | Status: AC
  Administered 2021-08-10: 50 mg via ORAL
  Filled 2021-08-10: qty 2

## 2021-08-10 MED ORDER — MONTELUKAST SODIUM 10 MG PO TABS
10.0000 mg | ORAL_TABLET | Freq: Once | ORAL | Status: AC
  Administered 2021-08-10: 10 mg via ORAL
  Filled 2021-08-10: qty 1

## 2021-08-10 NOTE — Progress Notes (Signed)
? ?Courtney Rogers ?[address] ?[place], [place] [address] ? ? ?CLINIC:  ?Medical Oncology/Hematology ? ?PCP:  ?Courtney Rival, FNP ?[address] / [place] [address] ?[phone] ? ? ?REASON FOR VISIT:  ?Follow-up for multiple myeloma ? ?PRIOR THERAPY: none ? ?NGS Results: not done ? ?CURRENT THERAPY: DaraVRd (Daratumumab SQ) q21d x 6 Cycles  ? ?BRIEF ONCOLOGIC HISTORY:  ?Oncology History  ?IgA myeloma (Courtney Rogers)  ?05/06/2021 Initial Diagnosis  ? IgA myeloma (Courtney Rogers) ?  ?08/10/2021 -  Chemotherapy  ? Patient is on Treatment Plan : MYELOMA NEWLY DIAGNOSED TRANSPLANT CANDIDATE DaraVRd (Daratumumab SQ) q21d x 6 Cycles (Induction/Consolidation)  ?   ? ? ?CANCER STAGING: ? Cancer Staging  ?No matching staging information was found for the patient. ? ?INTERVAL HISTORY:  ?[name], a 71 y.o. female, returns for routine follow-up and consideration for next cycle of chemotherapy. Courtney Rogers was last seen on 07/29/2021. ? ?Due for cycle #1 of DaraVRd today.  ? ?Overall, she tells me she has been feeling pretty well, and she is accompanied by her son. She has not yet started acyclovir or Revlimid. She reports numbness in her right knee when her knee occasionally swells, and she denies tingling or numbness in her hands and feet.  ? ?Overall, she feels ready for next cycle of chemo today.  ? ?REVIEW OF SYSTEMS:  ?Review of Systems  ?Constitutional:  Negative for appetite change and fatigue.  ?Musculoskeletal:  Positive for arthralgias (6/10 shoulder and tailbone).  ?Neurological:  Positive for headaches and numbness (R leg).  ?All other systems reviewed and are negative. ? ?PAST MEDICAL/SURGICAL HISTORY:  ?Past Medical History:  ?Diagnosis Date  ? Acute myocardial infarction [facility]) 2009  ? CAD/no stent medically managed  ? Anxiety disorder   ? CAD (coronary artery disease)   ? Hyperlipidemia   ? Hypertension   ? IBS (irritable bowel syndrome)   ? Non-ulcer dyspepsia 04/30/2009  ?  Qualifier: Diagnosis of  By: Oneida Alar MD, Sandi L   ? Overactive bladder   ? PE (pulmonary embolism) JUN 2014  ? Presence of permanent cardiac pacemaker   ? Sleep apnea   ? ?Past Surgical History:  ?Procedure Laterality Date  ? ABDOMINAL HYSTERECTOMY    ? BSO secondary to cyst    ? CARDIAC CATHETERIZATION    ? COLONOSCOPY  12/2009  ? Dr. West Carbo, propofol, normal. Next TCS 12/2019  ? COLONOSCOPY N/A 05/22/2017  ? Examined portion ileum was normal, significant looping of colon, external hemorrhoids and rectal bleeding due to internal hemorrhoids, mild diverticulosis procedure: COLONOSCOPY;  Surgeon: Danie Binder, MD;  [facility];  Service: Endoscopy;  Laterality: N/A;  10:30am  ? ESOPHAGOGASTRODUODENOSCOPY  05/11/2009  ? schatzki ring/small hiatal hernia/path:gastritis  ? ESOPHAGOGASTRODUODENOSCOPY N/A 05/22/2017  ? Multiple gastric polyps with biopsy benign fundic gland polyp, small bowel biopsy negative for celiac, gastric biopsy with mild gastritis but no H. pylori procedure: ESOPHAGOGASTRODUODENOSCOPY (EGD);  Surgeon: Danie Binder, MD;  [facility]: [facility];  Service: Endoscopy;  Laterality: N/A;  ? ESOPHAGOGASTRODUODENOSCOPY (EGD) WITH ESOPHAGEAL DILATION N/A 04/01/2013  ? VHQ:IONGEXBMW at the gastroesophageal juction/multiple small polyps/mild gastritis  ? GIVENS CAPSULE STUDY N/A 06/07/2017  ? Frequent gastric erosions, normal small bowel mucosa.  Procedure: GIVENS CAPSULE STUDY;  Surgeon: Danie Binder, MD;  [facility]: [facility];  Service: Endoscopy;  Laterality: N/A;  7:30am  ? INSERT / REPLACE / REMOVE PACEMAKER    ? Last year per  pt.(can't remember date)  ? 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 ? ?SOCIAL HISTORY:  ?Social History  ? ?Socioeconomic History  ? Marital status: Married  ?  Spouse name: Courtney Rogers  ? Number of children: 3  ? Years of education: 52  ? Highest education level: Not on file  ?Occupational History  ? Not on file  ?Tobacco Use  ? Smoking status: Never  ?  Smokeless tobacco: Never  ? Tobacco comments:  ?  Never smoked   ?Vaping Use  ? Vaping Use: Never used  ?Substance and Sexual Activity  ? Alcohol use: Yes  ?  Alcohol/week: 0.0 standard drinks  ?  Comment: occ wine  ? Drug use: No  ? Sexual activity: Not Currently  ?Other Topics Concern  ? Not on file  ?Social History Narrative  ?   ? Lives with husband-51 years 56 in Aug 2021  ? Daughter is close by, 2 sons live further away  ? One IN [place],ONE IN [place], ONE BESIDE HER.    ?   ? 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  ? Enjoys: reading, young adult  ?   ? Diet: eats all food groups   ? Caffeine: coffee 1, tea daily soda-1 daily  ? Water: 1-2 cups  ?   ? Wears seat belt   ? Does not use phone while driving  ? Smoke detectors at home   ? Data processing manager  ? Weapons -locked up  ?   ? Left handed  ? One story home  ? Drinks caffeine  ? ?Social Determinants of Health  ? ?Financial Resource Strain: Low Risk   ? Difficulty of Paying Living Expenses: Not hard at all  ?Food Insecurity: No Food Insecurity  ? Worried About Charity fundraiser in the Last Year: Never true  ? Ran Out of Food in the Last Year: Never true  ?Transportation Needs: No Transportation Needs  ? Lack of Transportation (Medical): No  ? Lack of Transportation (Non-Medical): No  ?Physical Activity: Inactive  ? Days of Exercise per Week: 0 days  ? Minutes of Exercise per Session: 0 min  ?Stress: Stress Concern Present  ? Feeling of Stress : Rather much  ?Social Connections: Moderately Integrated  ? Frequency of Communication with Friends and Family: Once a week  ? Frequency of Social Gatherings with Friends and Family: Once a week  ? Attends Religious Services: More than 4 times per year  ? Active Member of Clubs or Organizations: Yes  ? Attends Archivist Meetings: More than 4 times per year  ? Marital Status: Married  ?Intimate Partner Violence: Not At Risk  ? Fear of Current or Ex-Partner: No  ? Emotionally Abused: No  ?  Physically Abused: No  ? Sexually Abused: No  ? ? ?FAMILY HISTORY:  ?Family History  ?Problem Relation Age of Onset  ? Colon polyps Neg Hx   ? Colon cancer Neg Hx   ? ? ?CURRENT MEDICATIONS:  ?Current Outpatient Medications  ?Medication Sig Dispense Refill  ? acetaminophen (TYLENOL) 500 MG tablet Take 1,000 mg by mouth 2 (two) times daily as needed for moderate pain or headache.    ? acyclovir (ZOVIRAX) 400 MG tablet Take 1 tablet (400 mg total) by mouth 2 (two) times daily. 60 tablet 6  ? albuterol (PROVENTIL HFA;VENTOLIN HFA) 108 (90 BASE) MCG/ACT inhaler Inhale 2 puffs into the lungs every 6 (six) hours as needed for shortness of breath.    ?  amLODipine (NORVASC) 10 MG tablet Take 1 tablet (10 mg total) by mouth daily. 90 tablet 1  ? aspirin EC 81 MG tablet Take 81 mg by mouth at bedtime.    ? B Complex Vitamins (VITAMIN B COMPLEX) TABS Take 1 tablet by mouth daily.    ? bortezomib SQ (VELCADE) 3.5 MG injection Inject into the skin once.    ? busPIRone (BUSPAR) 5 MG tablet Take 5 mg by mouth daily.    ? Calcium Carbonate-Vitamin D (CALCIUM 600+D PO) Take 1 tablet by mouth 2 (two) times daily.    ? chlorthalidone (HYGROTON) 25 MG tablet Take 25 mg by mouth daily.     ? cholecalciferol (VITAMIN D) 25 MCG (1000 UNIT) tablet Take 1,000 Units by mouth daily.    ? Daratumumab-Hyaluronidase-fihj (DARZALEX FASPRO [place]) Inject into the skin.    ? fluticasone (FLONASE) 50 MCG/ACT nasal spray Place 2 sprays into both nostrils daily. 16 g 6  ? fluticasone-salmeterol (ADVAIR) 250-50 MCG/ACT AEPB Inhale 1 puff into the lungs 2 (two) times daily.    ? folic acid (FOLVITE) 1 MG tablet TAKE 1 TABLET BY MOUTH ONCE DAILY. (Patient taking differently: Take 1 mg by mouth daily.) 30 tablet 11  ? gabapentin (NEURONTIN) 600 MG tablet Take 600 mg by mouth 2 (two) times daily.    ? lenalidomide (REVLIMID) 15 MG capsule Take 1 capsule (15 mg total) by mouth daily. 21 days on, 7 days off every 28 days 21 capsule 0  ? lidocaine (XYLOCAINE) 2 %  solution TAKE 2 TEASPOONFULS BEFORE MEALS AND AT BEDTIME AS NEEDED MAY REPEAT EVERY 4 HOURS. (MAX OF 8 DOSES PER DAY) 300 mL 0  ? Lidocaine HCl (XOLIDO) 2 % CREA Apply 1 application topically daily as needed (arthritisi

## 2021-08-10 NOTE — Progress Notes (Signed)
Received ok to decrease diphenhydramine to 25 mg po x 1 as premedication for Darzalex Faspro due to excessive drowsiness of 50 mg dose. ? ?T.O. Dr Rhys Martini, PharmD ?

## 2021-08-10 NOTE — Patient Instructions (Signed)
[facility]  Discharge Instructions: ?Thank you for choosing [facility] to provide your oncology and hematology care.  ?If you have a lab appointment with the [facility], please come in thru the Main Entrance and check in at the main information desk. ? ?Wear comfortable clothing and clothing appropriate for easy access to any Portacath or PICC line.  ? ?We strive to give you quality time with your provider. You may need to reschedule your appointment if you arrive late (15 or more minutes).  Arriving late affects you and other patients whose appointments are after yours.  Also, if you miss three or more appointments without notifying the office, you may be dismissed from the clinic at the provider?s discretion.    ?  ?For prescription refill requests, have your pharmacy contact our office and allow 72 hours for refills to be completed.   ? ?Today you received the following chemotherapy and/or immunotherapy agents Daratumumab and Velcade, return as scheduled. ?  ?To help prevent nausea and vomiting after your treatment, we encourage you to take your nausea medication as directed. ? ?BELOW ARE SYMPTOMS THAT SHOULD BE REPORTED IMMEDIATELY: ?*FEVER GREATER THAN 100.4 F (38 ?C) OR HIGHER ?*CHILLS OR SWEATING ?*NAUSEA AND VOMITING THAT IS NOT CONTROLLED WITH YOUR NAUSEA MEDICATION ?*UNUSUAL SHORTNESS OF BREATH ?*UNUSUAL BRUISING OR BLEEDING ?*URINARY PROBLEMS (pain or burning when urinating, or frequent urination) ?*BOWEL PROBLEMS (unusual diarrhea, constipation, pain near the anus) ?TENDERNESS IN MOUTH AND THROAT WITH OR WITHOUT PRESENCE OF ULCERS (sore throat, sores in mouth, or a toothache) ?UNUSUAL RASH, SWELLING OR PAIN  ?UNUSUAL VAGINAL DISCHARGE OR ITCHING  ? ?Items with * indicate a potential emergency and should be followed up as soon as possible or go to the Emergency Department if any problems should occur. ? ?Please show the CHEMOTHERAPY ALERT CARD or IMMUNOTHERAPY ALERT CARD at  check-in to the Emergency Department and triage nurse. ? ?Should you have questions after your visit or need to cancel or reschedule your appointment, please contact [facility] [phone]  and follow the prompts.  Office hours are 8:00 a.m. to 4:30 p.m. Monday - Friday. Please note that voicemails left after 4:00 p.m. may not be returned until the following business day.  We are closed weekends and major holidays. You have access to a nurse at all times for urgent questions. Please call the main number to the clinic [phone] and follow the prompts. ? ?For any non-urgent questions, you may also contact your provider using MyChart. We now offer e-Visits for anyone 32 and older to request care online for non-urgent symptoms. For details visit [email or web address]. ?  ?Also download the MyChart app! Go to the app store, search "MyChart", open the app, select [facility], and log in with your MyChart username and password. ? ?Due to Covid, a mask is required upon entering the hospital/clinic. If you do not have a mask, one will be given to you upon arrival. For doctor visits, patients may have 1 support person aged 57 or older with them. For treatment visits, patients cannot have anyone with them due to current Covid guidelines and our immunocompromised population.  ?

## 2021-08-10 NOTE — Patient Instructions (Signed)
[facility] at [facility] ?Discharge Instructions ? ? ?You were seen and examined today by Dr. Delton Coombes. ? ?He reviewed your lab work which is normal/stable.  ? ?He discussed your treatment plan with you.  You were given a folder with in depth detail about each of these drugs.  ? ?We will proceed with your treatment today and as scheduled.  ? ?Return as scheduled.  ? ? ?Thank you for choosing [place] at [facility] to provide your oncology and hematology care.  To afford each patient quality time with our provider, please arrive at least 15 minutes before your scheduled appointment time.  ? ?If you have a lab appointment with the [facility] please come in thru the Main Entrance and check in at the main information desk. ? ?You need to re-schedule your appointment should you arrive 10 or more minutes late.  We strive to give you quality time with our providers, and arriving late affects you and other patients whose appointments are after yours.  Also, if you no show three or more times for appointments you may be dismissed from the clinic at the providers discretion.     ?Again, thank you for choosing [facility].  Our hope is that these requests will decrease the amount of time that you wait before being seen by our physicians.       ?_____________________________________________________________ ? ?Should you have questions after your visit to [facility], please contact our office at [phone] and follow the prompts.  Our office hours are 8:00 a.m. and 4:30 p.m. Monday - Friday.  Please note that voicemails left after 4:00 p.m. may not be returned until the following business day.  We are closed weekends and major holidays.  You do have access to a nurse 24-7, just call the main number to the clinic [phone] and do not press any options, hold on the line and a nurse will answer the phone.   ? ?For prescription refill requests,  have your pharmacy contact our office and allow 72 hours.   ? ?Due to Covid, you will need to wear a mask upon entering the hospital. If you do not have a mask, a mask will be given to you at the Main Entrance upon arrival. For doctor visits, patients may have 1 support person age 85 or older with them. For treatment visits, patients can not have anyone with them due to social distancing guidelines and our immunocompromised population.  ? ?   ?

## 2021-08-10 NOTE — Progress Notes (Signed)
Patient has been examined by Dr. Katragadda, and vital signs and labs have been reviewed. ANC, Creatinine, LFTs, hemoglobin, and platelets are within treatment parameters per M.D. - pt may proceed with treatment.    °

## 2021-08-10 NOTE — Progress Notes (Signed)

## 2021-08-10 NOTE — Progress Notes (Signed)
Chaplain engaged in an initial visit with [name] and her oldest son.  [name] shared of the news she had just received and voiced that she was feeling "overwhelmed."  [name] was able to name that she is still in shock in recognizing that she has cancer and will have a treatment schedule.  She was in a place of processing all the things she will need to do and remember.  Chaplain affirmed [name]'s emotions and also affirmed the support of her family, the medical team and spiritual care to help her throughout this time.  ? ?[name] voiced that she is a woman of faith and sometimes finds it hard to have joy in the midst of suffering and going through.  Chaplain offered some encouragement, as well as her son.  Chaplain offered a blessing of healing and recovery over her.  ? ?Chaplain offered reflective listening, a compassionate presence, spiritual care and support to [name].  ? ? ? 08/10/21 1100  ?Clinical Encounter Type  ?Visited With Patient and family together  ?Visit Type Initial;Spiritual support  ? ? ?

## 2021-08-10 NOTE — Progress Notes (Signed)
Patient assessed by Dr. Delton Coombes, and is okay for treatment today. Consent signed by patient. ?Patient tolerated Daratumumab injection with no complaints voiced. Patient appears drowsy after benadryl dose, MD and pharmacy aware, okay to reduce dose to '25mg'$  for future injections per Dr. Delton Coombes, pharmacy aware. See MAR for details. Lab reviewed. Injection site clean and dry with no bruising or swelling noted at site. Band aid applied. Vss with discharge and left in satisfactory condition with nos/s of distress noted.   ?

## 2021-08-11 ENCOUNTER — Encounter (HOSPITAL_COMMUNITY): Payer: Medicare Other

## 2021-08-11 ENCOUNTER — Telehealth (HOSPITAL_COMMUNITY): Payer: Self-pay | Admitting: *Deleted

## 2021-08-11 DIAGNOSIS — C9000 Multiple myeloma not having achieved remission: Secondary | ICD-10-CM | POA: Diagnosis not present

## 2021-08-11 LAB — PRETREATMENT RBC PHENOTYPE

## 2021-08-11 LAB — KAPPA/LAMBDA LIGHT CHAINS
Kappa free light chain: 58.2 mg/L — ABNORMAL HIGH (ref 3.3–19.4)
Kappa, lambda light chain ratio: 23.28 — ABNORMAL HIGH (ref 0.26–1.65)
Lambda free light chains: 2.5 mg/L — ABNORMAL LOW (ref 5.7–26.3)

## 2021-08-11 LAB — PROTEIN ELECTROPHORESIS, SERUM
A/G Ratio: 0.6 — ABNORMAL LOW (ref 0.7–1.7)
Albumin ELP: 3.9 g/dL (ref 2.9–4.4)
Alpha-1-Globulin: 0.3 g/dL (ref 0.0–0.4)
Alpha-2-Globulin: 0.6 g/dL (ref 0.4–1.0)
Beta Globulin: 1.3 g/dL (ref 0.7–1.3)
Gamma Globulin: 4.5 g/dL — ABNORMAL HIGH (ref 0.4–1.8)
Globulin, Total: 6.7 g/dL — ABNORMAL HIGH (ref 2.2–3.9)
M-Spike, %: 3.4 g/dL — ABNORMAL HIGH
Total Protein ELP: 10.6 g/dL — ABNORMAL HIGH (ref 6.0–8.5)

## 2021-08-11 LAB — BETA 2 MICROGLOBULIN, SERUM: Beta-2 Microglobulin: 3.5 mg/L — ABNORMAL HIGH (ref 0.6–2.4)

## 2021-08-11 NOTE — Telephone Encounter (Signed)
Tried to reach patient several times for 24 hour chemotherapy follow-up but no answer. ?

## 2021-08-12 ENCOUNTER — Ambulatory Visit (HOSPITAL_COMMUNITY): Payer: Medicare Other

## 2021-08-12 ENCOUNTER — Other Ambulatory Visit (HOSPITAL_COMMUNITY): Payer: Medicare Other

## 2021-08-13 ENCOUNTER — Encounter (HOSPITAL_COMMUNITY): Admission: RE | Payer: Self-pay | Source: Home / Self Care

## 2021-08-13 ENCOUNTER — Ambulatory Visit (HOSPITAL_COMMUNITY): Admission: RE | Admit: 2021-08-13 | Payer: Medicare Other | Source: Home / Self Care | Admitting: Surgery

## 2021-08-13 ENCOUNTER — Inpatient Hospital Stay (HOSPITAL_COMMUNITY): Payer: Medicare Other

## 2021-08-13 ENCOUNTER — Other Ambulatory Visit: Payer: Self-pay

## 2021-08-13 VITALS — BP 117/63 | HR 83 | Temp 97.1°F | Resp 18

## 2021-08-13 DIAGNOSIS — C9000 Multiple myeloma not having achieved remission: Secondary | ICD-10-CM | POA: Diagnosis not present

## 2021-08-13 SURGERY — INSERTION, TUNNELED CENTRAL VENOUS DEVICE, WITH PORT
Anesthesia: Choice | Laterality: Right

## 2021-08-13 MED ORDER — PROCHLORPERAZINE MALEATE 10 MG PO TABS
10.0000 mg | ORAL_TABLET | Freq: Once | ORAL | Status: AC
  Administered 2021-08-13: 10 mg via ORAL
  Filled 2021-08-13: qty 1

## 2021-08-13 MED ORDER — BORTEZOMIB CHEMO SQ INJECTION 3.5 MG (2.5MG/ML)
1.3000 mg/m2 | Freq: Once | INTRAMUSCULAR | Status: AC
  Administered 2021-08-13: 2.25 mg via SUBCUTANEOUS
  Filled 2021-08-13: qty 0.9

## 2021-08-13 NOTE — Patient Instructions (Signed)
[facility]  Discharge Instructions: ?Thank you for choosing [facility] to provide your oncology and hematology care.  ?If you have a lab appointment with the [facility], please come in thru the Main Entrance and check in at the main information desk. ? ?Wear comfortable clothing and clothing appropriate for easy access to any Portacath or PICC line.  ? ?We strive to give you quality time with your provider. You may need to reschedule your appointment if you arrive late (15 or more minutes).  Arriving late affects you and other patients whose appointments are after yours.  Also, if you miss three or more appointments without notifying the office, you may be dismissed from the clinic at the provider?s discretion.    ?  ?For prescription refill requests, have your pharmacy contact our office and allow 72 hours for refills to be completed.   ? ?Today you received the following chemotherapy and/or immunotherapy agents Velcade    ?  ?To help prevent nausea and vomiting after your treatment, we encourage you to take your nausea medication as directed. ? ?BELOW ARE SYMPTOMS THAT SHOULD BE REPORTED IMMEDIATELY: ?*FEVER GREATER THAN 100.4 F (38 ?C) OR HIGHER ?*CHILLS OR SWEATING ?*NAUSEA AND VOMITING THAT IS NOT CONTROLLED WITH YOUR NAUSEA MEDICATION ?*UNUSUAL SHORTNESS OF BREATH ?*UNUSUAL BRUISING OR BLEEDING ?*URINARY PROBLEMS (pain or burning when urinating, or frequent urination) ?*BOWEL PROBLEMS (unusual diarrhea, constipation, pain near the anus) ?TENDERNESS IN MOUTH AND THROAT WITH OR WITHOUT PRESENCE OF ULCERS (sore throat, sores in mouth, or a toothache) ?UNUSUAL RASH, SWELLING OR PAIN  ?UNUSUAL VAGINAL DISCHARGE OR ITCHING  ? ?Items with * indicate a potential emergency and should be followed up as soon as possible or go to the Emergency Department if any problems should occur. ? ?Please show the CHEMOTHERAPY ALERT CARD or IMMUNOTHERAPY ALERT CARD at check-in to the Emergency  Department and triage nurse. ? ?Should you have questions after your visit or need to cancel or reschedule your appointment, please contact [facility] [phone]  and follow the prompts.  Office hours are 8:00 a.m. to 4:30 p.m. Monday - Friday. Please note that voicemails left after 4:00 p.m. may not be returned until the following business day.  We are closed weekends and major holidays. You have access to a nurse at all times for urgent questions. Please call the main number to the clinic [phone] and follow the prompts. ? ?For any non-urgent questions, you may also contact your provider using MyChart. We now offer e-Visits for anyone 64 and older to request care online for non-urgent symptoms. For details visit [email or web address]. ?  ?Also download the MyChart app! Go to the app store, search "MyChart", open the app, select [facility], and log in with your MyChart username and password. ? ?Due to Covid, a mask is required upon entering the hospital/clinic. If you do not have a mask, one will be given to you upon arrival. For doctor visits, patients may have 1 support person aged 41 or older with them. For treatment visits, patients cannot have anyone with them due to current Covid guidelines and our immunocompromised population.  ?

## 2021-08-13 NOTE — Progress Notes (Signed)
Patient presents today for Velcade injection per providers order.  Vital signs within parameters.  Labs on 08/10/21 within parameters.  Patient has no new complaints at this time. ? ?Stable during administration without incident; injection site WNL; see MAR for injection details.  Patient tolerated procedure well and without incident.  No questions or complaints noted at this time.  Discharge from clinic ambulatory in stable condition.  Alert and oriented X 3.  Follow up with [facility] as scheduled.   ? ? ? ? ?

## 2021-08-16 ENCOUNTER — Telehealth: Payer: Self-pay | Admitting: *Deleted

## 2021-08-16 NOTE — Chronic Care Management (AMB) (Signed)
?  Care Management  ? ?Note ? ?08/16/2021 ?Name: Courtney Rogers MRN: [id number] DOB: 02-18-1951 ? ?Courtney Rogers is a 71 y.o. year old female who is a primary care patient of Renee Rival, FNP and is actively engaged with the care management team. I reached out to [name] by phone today to assist with re-scheduling a follow up visit with the Licensed Clinical Social Worker ? ?Follow up plan: ?Unsuccessful telephone outreach attempt made. The care management team will reach out to the patient again over the next 7 days. If patient returns call to provider office, please advise to call [facility] at [phone]. ? ?Laverda Sorenson  ?Care Guide, Embedded Care Coordination ?[facility]  Care Management  ?Direct Dial: [phone] ? ?

## 2021-08-17 ENCOUNTER — Other Ambulatory Visit: Payer: Self-pay

## 2021-08-17 ENCOUNTER — Inpatient Hospital Stay (HOSPITAL_COMMUNITY): Payer: Medicare Other

## 2021-08-17 ENCOUNTER — Inpatient Hospital Stay (HOSPITAL_COMMUNITY): Payer: Medicare Other | Admitting: Hematology

## 2021-08-17 VITALS — BP 115/66 | HR 60 | Temp 97.8°F | Resp 18 | Ht 63.0 in | Wt 167.8 lb

## 2021-08-17 DIAGNOSIS — C9000 Multiple myeloma not having achieved remission: Secondary | ICD-10-CM | POA: Diagnosis not present

## 2021-08-17 LAB — COMPREHENSIVE METABOLIC PANEL
ALT: 13 U/L (ref 0–44)
AST: 18 U/L (ref 15–41)
Albumin: 3.7 g/dL (ref 3.5–5.0)
Alkaline Phosphatase: 64 U/L (ref 38–126)
Anion gap: 8 (ref 5–15)
BUN: 20 mg/dL (ref 8–23)
CO2: 29 mmol/L (ref 22–32)
Calcium: 8.7 mg/dL — ABNORMAL LOW (ref 8.9–10.3)
Chloride: 100 mmol/L (ref 98–111)
Creatinine, Ser: 0.83 mg/dL (ref 0.44–1.00)
GFR, Estimated: >60 mL/min (ref >60)
Glucose, Bld: 88 mg/dL (ref 70–99)
Potassium: 3.3 mmol/L — ABNORMAL LOW (ref 3.5–5.1)
Sodium: 137 mmol/L (ref 135–145)
Total Bilirubin: 0.5 mg/dL (ref 0.3–1.2)
Total Protein: 8.9 g/dL — ABNORMAL HIGH (ref 6.5–8.1)

## 2021-08-17 LAB — CBC WITH DIFFERENTIAL/PLATELET
Abs Immature Granulocytes: 0.02 10*3/uL (ref 0.00–0.07)
Basophils Absolute: 0 10*3/uL (ref 0.0–0.1)
Basophils Relative: 0 %
Eosinophils Absolute: 0.3 10*3/uL (ref 0.0–0.5)
Eosinophils Relative: 8 %
HCT: 29.9 % — ABNORMAL LOW (ref 36.0–46.0)
Hemoglobin: 9.4 g/dL — ABNORMAL LOW (ref 12.0–15.0)
Immature Granulocytes: 1 %
Lymphocytes Relative: 18 %
Lymphs Abs: 0.7 10*3/uL (ref 0.7–4.0)
MCH: 31.1 pg (ref 26.0–34.0)
MCHC: 31.4 g/dL (ref 30.0–36.0)
MCV: 99 fL (ref 80.0–100.0)
Monocytes Absolute: 0.4 10*3/uL (ref 0.1–1.0)
Monocytes Relative: 10 %
Neutro Abs: 2.5 10*3/uL (ref 1.7–7.7)
Neutrophils Relative %: 63 %
Platelets: 71 10*3/uL — ABNORMAL LOW (ref 150–400)
RBC: 3.02 MIL/uL — ABNORMAL LOW (ref 3.87–5.11)
RDW: 15.6 % — ABNORMAL HIGH (ref 11.5–15.5)
WBC: 3.9 10*3/uL — ABNORMAL LOW (ref 4.0–10.5)
nRBC: 0 % (ref 0.0–0.2)

## 2021-08-17 LAB — MAGNESIUM: Magnesium: 2 mg/dL (ref 1.7–2.4)

## 2021-08-17 MED ORDER — ACETAMINOPHEN 325 MG PO TABS
650.0000 mg | ORAL_TABLET | Freq: Once | ORAL | Status: AC
  Administered 2021-08-17: 650 mg via ORAL
  Filled 2021-08-17: qty 2

## 2021-08-17 MED ORDER — MONTELUKAST SODIUM 10 MG PO TABS
10.0000 mg | ORAL_TABLET | Freq: Once | ORAL | Status: AC
  Administered 2021-08-17: 10 mg via ORAL
  Filled 2021-08-17: qty 1

## 2021-08-17 MED ORDER — DARATUMUMAB-HYALURONIDASE-FIHJ 1800-30000 MG-UT/15ML ~~LOC~~ SOLN
1800.0000 mg | Freq: Once | SUBCUTANEOUS | Status: AC
  Administered 2021-08-17: 1800 mg via SUBCUTANEOUS
  Filled 2021-08-17: qty 15

## 2021-08-17 MED ORDER — DEXAMETHASONE 4 MG PO TABS
20.0000 mg | ORAL_TABLET | Freq: Once | ORAL | Status: AC
  Administered 2021-08-17: 20 mg via ORAL
  Filled 2021-08-17: qty 5

## 2021-08-17 MED ORDER — DIPHENHYDRAMINE HCL 25 MG PO CAPS
25.0000 mg | ORAL_CAPSULE | Freq: Once | ORAL | Status: AC
  Administered 2021-08-17: 25 mg via ORAL
  Filled 2021-08-17: qty 1

## 2021-08-17 MED ORDER — BORTEZOMIB CHEMO SQ INJECTION 3.5 MG (2.5MG/ML)
1.3000 mg/m2 | Freq: Once | INTRAMUSCULAR | Status: AC
  Administered 2021-08-17: 2.25 mg via SUBCUTANEOUS
  Filled 2021-08-17: qty 0.9

## 2021-08-17 MED ORDER — POTASSIUM CHLORIDE CRYS ER 20 MEQ PO TBCR
40.0000 meq | EXTENDED_RELEASE_TABLET | Freq: Once | ORAL | Status: AC
  Administered 2021-08-17: 40 meq via ORAL
  Filled 2021-08-17: qty 2

## 2021-08-17 NOTE — Patient Instructions (Signed)
Courtney Rogers  Discharge Instructions: ?Thank you for choosing [facility] to provide your oncology and hematology care.  ?If you have a lab appointment with the [facility], please come in thru the Main Entrance and check in at the main information desk. ? ?Wear comfortable clothing and clothing appropriate for easy access to any Portacath or PICC line.  ? ?We strive to give you quality time with your provider. You may need to reschedule your appointment if you arrive late (15 or more minutes).  Arriving late affects you and other patients whose appointments are after yours.  Also, if you miss three or more appointments without notifying the office, you may be dismissed from the clinic at the provider?s discretion.    ?  ?For prescription refill requests, have your pharmacy contact our office and allow 72 hours for refills to be completed.   ? ?Today you received the following chemotherapy and/or immunotherapy agents Dara [place] and Velcade injection. ?  ?To help prevent nausea and vomiting after your treatment, we encourage you to take your nausea medication as directed. ? ?BELOW ARE SYMPTOMS THAT SHOULD BE REPORTED IMMEDIATELY: ?*FEVER GREATER THAN 100.4 F (38 ?C) OR HIGHER ?*CHILLS OR SWEATING ?*NAUSEA AND VOMITING THAT IS NOT CONTROLLED WITH YOUR NAUSEA MEDICATION ?*UNUSUAL SHORTNESS OF BREATH ?*UNUSUAL BRUISING OR BLEEDING ?*URINARY PROBLEMS (pain or burning when urinating, or frequent urination) ?*BOWEL PROBLEMS (unusual diarrhea, constipation, pain near the anus) ?TENDERNESS IN MOUTH AND THROAT WITH OR WITHOUT PRESENCE OF ULCERS (sore throat, sores in mouth, or a toothache) ?UNUSUAL RASH, SWELLING OR PAIN  ?UNUSUAL VAGINAL DISCHARGE OR ITCHING  ? ?Items with * indicate a potential emergency and should be followed up as soon as possible or go to the Emergency Department if any problems should occur. ? ?Please show the CHEMOTHERAPY ALERT CARD or IMMUNOTHERAPY ALERT CARD at check-in to  the Emergency Department and triage nurse. ? ?Should you have questions after your visit or need to cancel or reschedule your appointment, please contact [facility] [phone]  and follow the prompts.  Office hours are 8:00 a.m. to 4:30 p.m. Monday - Friday. Please note that voicemails left after 4:00 p.m. may not be returned until the following business day.  We are closed weekends and major holidays. You have access to a nurse at all times for urgent questions. Please call the main number to the clinic [phone] and follow the prompts. ? ?For any non-urgent questions, you may also contact your provider using MyChart. We now offer e-Visits for anyone 41 and older to request care online for non-urgent symptoms. For details visit [email or web address]. ?  ?Also download the MyChart app! Go to the app store, search "MyChart", open the app, select [facility], and log in with your MyChart username and password. ? ?Due to Covid, a mask is required upon entering the hospital/clinic. If you do not have a mask, one will be given to you upon arrival. For doctor visits, patients may have 1 support person aged 52 or older with them. For treatment visits, patients cannot have anyone with them due to current Covid guidelines and our immunocompromised population.  ?

## 2021-08-17 NOTE — Progress Notes (Signed)
Pt presents today for Daratumumab and Velcade injection per provider's order. Vital signs and other labs WNL for treatment today. Pt's platelets are 71 and potassium is 3.3, MD made aware, okay to proceed with treatment today. MD stated to give potassium chloride 40 mEq p.o x 1 dose. ? ?Pt voiced no complaints after 1 hour administration of Daratumumab injection. ? ?Daratumumab, Velcade injections, and potassium chloride 87mq p.o x 1 dose given today per MD orders. Tolerated infusion without adverse affects. Vital signs stable. No complaints at this time. Discharged from clinic ambulatory with walker in stable condition. Alert and oriented x 3. [facility]   ?

## 2021-08-19 ENCOUNTER — Ambulatory Visit (HOSPITAL_COMMUNITY): Payer: Medicare Other

## 2021-08-19 ENCOUNTER — Other Ambulatory Visit (HOSPITAL_COMMUNITY): Payer: Medicare Other

## 2021-08-20 ENCOUNTER — Inpatient Hospital Stay (HOSPITAL_COMMUNITY): Payer: Medicare Other

## 2021-08-20 VITALS — BP 106/60 | HR 74 | Temp 97.6°F | Resp 18

## 2021-08-20 DIAGNOSIS — I10 Essential (primary) hypertension: Secondary | ICD-10-CM

## 2021-08-20 DIAGNOSIS — F32A Depression, unspecified: Secondary | ICD-10-CM

## 2021-08-20 DIAGNOSIS — C9000 Multiple myeloma not having achieved remission: Secondary | ICD-10-CM

## 2021-08-20 DIAGNOSIS — F419 Anxiety disorder, unspecified: Secondary | ICD-10-CM

## 2021-08-20 DIAGNOSIS — M810 Age-related osteoporosis without current pathological fracture: Secondary | ICD-10-CM

## 2021-08-20 DIAGNOSIS — D508 Other iron deficiency anemias: Secondary | ICD-10-CM

## 2021-08-20 MED ORDER — PROCHLORPERAZINE MALEATE 10 MG PO TABS
10.0000 mg | ORAL_TABLET | Freq: Once | ORAL | Status: AC
  Administered 2021-08-20: 10 mg via ORAL
  Filled 2021-08-20: qty 1

## 2021-08-20 MED ORDER — BORTEZOMIB CHEMO SQ INJECTION 3.5 MG (2.5MG/ML)
1.3000 mg/m2 | Freq: Once | INTRAMUSCULAR | Status: AC
  Administered 2021-08-20: 2.25 mg via SUBCUTANEOUS
  Filled 2021-08-20: qty 0.9

## 2021-08-20 NOTE — Progress Notes (Signed)
Patient presents today for Velcade injection.  Patient is in satisfactory condition with no complaints voiced.  Vital signs are stable.  Labs drawn on 08/17/2021 and reviewed today.  Platelets are 71.  Dr. Delton Coombes informed.  We will proceed with injection per MD orders.  All other labs are within treatment parameters.  ? ?Patient tolerated injection with no complaints voiced.  Site clean and dry with no bruising or swelling noted.  No complaints of pain.  Discharged with vital signs stable and no signs or symptoms of distress noted.   ?

## 2021-08-20 NOTE — Patient Instructions (Signed)
[facility]  Discharge Instructions: Thank you for choosing [facility] to provide your oncology and hematology care.  If you have a lab appointment with the Cancer Center, please come in thru the Main Entrance and check in at the main information desk.  Wear comfortable clothing and clothing appropriate for easy access to any Portacath or PICC line.   We strive to give you quality time with your provider. You may need to reschedule your appointment if you arrive late (15 or more minutes).  Arriving late affects you and other patients whose appointments are after yours.  Also, if you miss three or more appointments without notifying the office, you may be dismissed from the clinic at the provider's discretion.      For prescription refill requests, have your pharmacy contact our office and allow 72 hours for refills to be completed.        To help prevent nausea and vomiting after your treatment, we encourage you to take your nausea medication as directed.  BELOW ARE SYMPTOMS THAT SHOULD BE REPORTED IMMEDIATELY: *FEVER GREATER THAN 100.4 F (38 C) OR HIGHER *CHILLS OR SWEATING *NAUSEA AND VOMITING THAT IS NOT CONTROLLED WITH YOUR NAUSEA MEDICATION *UNUSUAL SHORTNESS OF BREATH *UNUSUAL BRUISING OR BLEEDING *URINARY PROBLEMS (pain or burning when urinating, or frequent urination) *BOWEL PROBLEMS (unusual diarrhea, constipation, pain near the anus) TENDERNESS IN MOUTH AND THROAT WITH OR WITHOUT PRESENCE OF ULCERS (sore throat, sores in mouth, or a toothache) UNUSUAL RASH, SWELLING OR PAIN  UNUSUAL VAGINAL DISCHARGE OR ITCHING   Items with * indicate a potential emergency and should be followed up as soon as possible or go to the Emergency Department if any problems should occur.  Please show the CHEMOTHERAPY ALERT CARD or IMMUNOTHERAPY ALERT CARD at check-in to the Emergency Department and triage nurse.  Should you have questions after your visit or need to cancel  or reschedule your appointment, please contact [facility] [phone]  and follow the prompts.  Office hours are 8:00 a.m. to 4:30 p.m. Monday - Friday. Please note that voicemails left after 4:00 p.m. may not be returned until the following business day.  We are closed weekends and major holidays. You have access to a nurse at all times for urgent questions. Please call the main number to the clinic [phone] and follow the prompts.  For any non-urgent questions, you may also contact your provider using MyChart. We now offer e-Visits for anyone 18 and older to request care online for non-urgent symptoms. For details visit [email or web address].   Also download the MyChart app! Go to the app store, search "MyChart", open the app, select , and log in with your MyChart username and password.  Due to Covid, a mask is required upon entering the hospital/clinic. If you do not have a mask, one will be given to you upon arrival. For doctor visits, patients may have 1 support person aged 18 or older with them. For treatment visits, patients cannot have anyone with them due to current Covid guidelines and our immunocompromised population.  

## 2021-08-24 ENCOUNTER — Inpatient Hospital Stay (HOSPITAL_COMMUNITY): Payer: Medicare Other | Attending: Hematology

## 2021-08-24 ENCOUNTER — Ambulatory Visit: Payer: Medicare Other | Admitting: Nurse Practitioner

## 2021-08-24 ENCOUNTER — Encounter (HOSPITAL_COMMUNITY): Payer: Self-pay | Admitting: *Deleted

## 2021-08-24 ENCOUNTER — Inpatient Hospital Stay (HOSPITAL_COMMUNITY): Payer: Medicare Other

## 2021-08-24 ENCOUNTER — Ambulatory Visit (HOSPITAL_COMMUNITY): Payer: Medicare Other | Admitting: Hematology

## 2021-08-24 VITALS — BP 127/71 | HR 86 | Temp 97.0°F | Resp 18 | Ht 63.58 in | Wt 168.4 lb

## 2021-08-24 DIAGNOSIS — Z5112 Encounter for antineoplastic immunotherapy: Secondary | ICD-10-CM | POA: Diagnosis present

## 2021-08-24 DIAGNOSIS — D649 Anemia, unspecified: Secondary | ICD-10-CM | POA: Diagnosis not present

## 2021-08-24 DIAGNOSIS — I10 Essential (primary) hypertension: Secondary | ICD-10-CM | POA: Diagnosis not present

## 2021-08-24 DIAGNOSIS — Z79899 Other long term (current) drug therapy: Secondary | ICD-10-CM | POA: Diagnosis not present

## 2021-08-24 DIAGNOSIS — C9000 Multiple myeloma not having achieved remission: Secondary | ICD-10-CM | POA: Diagnosis present

## 2021-08-24 LAB — CBC WITH DIFFERENTIAL/PLATELET
Abs Immature Granulocytes: 0.02 10*3/uL (ref 0.00–0.07)
Basophils Absolute: 0 10*3/uL (ref 0.0–0.1)
Basophils Relative: 0 %
Eosinophils Absolute: 0.3 10*3/uL (ref 0.0–0.5)
Eosinophils Relative: 8 %
HCT: 30.8 % — ABNORMAL LOW (ref 36.0–46.0)
Hemoglobin: 9.7 g/dL — ABNORMAL LOW (ref 12.0–15.0)
Immature Granulocytes: 1 %
Lymphocytes Relative: 16 %
Lymphs Abs: 0.7 10*3/uL (ref 0.7–4.0)
MCH: 31.9 pg (ref 26.0–34.0)
MCHC: 31.5 g/dL (ref 30.0–36.0)
MCV: 101.3 fL — ABNORMAL HIGH (ref 80.0–100.0)
Monocytes Absolute: 0.9 10*3/uL (ref 0.1–1.0)
Monocytes Relative: 21 %
Neutro Abs: 2.2 10*3/uL (ref 1.7–7.7)
Neutrophils Relative %: 54 %
Platelets: 78 10*3/uL — ABNORMAL LOW (ref 150–400)
RBC: 3.04 MIL/uL — ABNORMAL LOW (ref 3.87–5.11)
RDW: 15.8 % — ABNORMAL HIGH (ref 11.5–15.5)
WBC: 4 10*3/uL (ref 4.0–10.5)
nRBC: 0 % (ref 0.0–0.2)

## 2021-08-24 LAB — COMPREHENSIVE METABOLIC PANEL
ALT: 13 U/L (ref 0–44)
AST: 16 U/L (ref 15–41)
Albumin: 3.9 g/dL (ref 3.5–5.0)
Alkaline Phosphatase: 70 U/L (ref 38–126)
Anion gap: 9 (ref 5–15)
BUN: 24 mg/dL — ABNORMAL HIGH (ref 8–23)
CO2: 29 mmol/L (ref 22–32)
Calcium: 8.7 mg/dL — ABNORMAL LOW (ref 8.9–10.3)
Chloride: 102 mmol/L (ref 98–111)
Creatinine, Ser: 0.86 mg/dL (ref 0.44–1.00)
GFR, Estimated: >60 mL/min (ref >60)
Glucose, Bld: 71 mg/dL (ref 70–99)
Potassium: 3.8 mmol/L (ref 3.5–5.1)
Sodium: 140 mmol/L (ref 135–145)
Total Bilirubin: 1 mg/dL (ref 0.3–1.2)
Total Protein: 7.5 g/dL (ref 6.5–8.1)

## 2021-08-24 LAB — MAGNESIUM: Magnesium: 2.1 mg/dL (ref 1.7–2.4)

## 2021-08-24 MED ORDER — DIPHENHYDRAMINE HCL 25 MG PO CAPS
25.0000 mg | ORAL_CAPSULE | Freq: Once | ORAL | Status: AC
  Administered 2021-08-24: 25 mg via ORAL
  Filled 2021-08-24: qty 1

## 2021-08-24 MED ORDER — DEXAMETHASONE 4 MG PO TABS
20.0000 mg | ORAL_TABLET | Freq: Once | ORAL | Status: AC
  Administered 2021-08-24: 20 mg via ORAL
  Filled 2021-08-24: qty 5

## 2021-08-24 MED ORDER — DARATUMUMAB-HYALURONIDASE-FIHJ 1800-30000 MG-UT/15ML ~~LOC~~ SOLN
1800.0000 mg | Freq: Once | SUBCUTANEOUS | Status: AC
  Administered 2021-08-24: 1800 mg via SUBCUTANEOUS
  Filled 2021-08-24: qty 15

## 2021-08-24 MED ORDER — ACETAMINOPHEN 325 MG PO TABS
650.0000 mg | ORAL_TABLET | Freq: Once | ORAL | Status: AC
  Administered 2021-08-24: 650 mg via ORAL
  Filled 2021-08-24: qty 2

## 2021-08-24 MED ORDER — MONTELUKAST SODIUM 10 MG PO TABS
10.0000 mg | ORAL_TABLET | Freq: Once | ORAL | Status: AC
  Administered 2021-08-24: 10 mg via ORAL
  Filled 2021-08-24: qty 1

## 2021-08-24 NOTE — Progress Notes (Signed)
Patient contacted after hours line over the weekend and c/o numbness of left side of face and head extending to the shoulder.  She was advised to report to the ER and I do not see any record of her doing so at [facility].  Attempted multiple times to contact her on 08/23/21 without success.  She is here for treatment today and will advise team of symptoms. ?

## 2021-08-24 NOTE — Progress Notes (Signed)
Ok to proceed with Darzalex Faspro with platelets 78K ? ?T.O Dr Rhys Martini, PharmD ?

## 2021-08-24 NOTE — Patient Instructions (Signed)
[facility]  Discharge Instructions: ?Thank you for choosing [facility] to provide your oncology and hematology care.  ?If you have a lab appointment with the [facility], please come in thru the Main Entrance and check in at the main information desk. ? ?Wear comfortable clothing and clothing appropriate for easy access to any Portacath or PICC line.  ? ?We strive to give you quality time with your provider. You may need to reschedule your appointment if you arrive late (15 or more minutes).  Arriving late affects you and other patients whose appointments are after yours.  Also, if you miss three or more appointments without notifying the office, you may be dismissed from the clinic at the provider?s discretion.    ?  ?For prescription refill requests, have your pharmacy contact our office and allow 72 hours for refills to be completed.   ? ?Today you received the following chemotherapy and/or immunotherapy agents Daratumumab injection    ?  ?To help prevent nausea and vomiting after your treatment, we encourage you to take your nausea medication as directed. ? ?BELOW ARE SYMPTOMS THAT SHOULD BE REPORTED IMMEDIATELY: ?*FEVER GREATER THAN 100.4 F (38 ?C) OR HIGHER ?*CHILLS OR SWEATING ?*NAUSEA AND VOMITING THAT IS NOT CONTROLLED WITH YOUR NAUSEA MEDICATION ?*UNUSUAL SHORTNESS OF BREATH ?*UNUSUAL BRUISING OR BLEEDING ?*URINARY PROBLEMS (pain or burning when urinating, or frequent urination) ?*BOWEL PROBLEMS (unusual diarrhea, constipation, pain near the anus) ?TENDERNESS IN MOUTH AND THROAT WITH OR WITHOUT PRESENCE OF ULCERS (sore throat, sores in mouth, or a toothache) ?UNUSUAL RASH, SWELLING OR PAIN  ?UNUSUAL VAGINAL DISCHARGE OR ITCHING  ? ?Items with * indicate a potential emergency and should be followed up as soon as possible or go to the Emergency Department if any problems should occur. ? ?Please show the CHEMOTHERAPY ALERT CARD or IMMUNOTHERAPY ALERT CARD at check-in to the  Emergency Department and triage nurse. ? ?Should you have questions after your visit or need to cancel or reschedule your appointment, please contact [facility] [phone]  and follow the prompts.  Office hours are 8:00 a.m. to 4:30 p.m. Monday - Friday. Please note that voicemails left after 4:00 p.m. may not be returned until the following business day.  We are closed weekends and major holidays. You have access to a nurse at all times for urgent questions. Please call the main number to the clinic [phone] and follow the prompts. ? ?For any non-urgent questions, you may also contact your provider using MyChart. We now offer e-Visits for anyone 14 and older to request care online for non-urgent symptoms. For details visit [email or web address]. ?  ?Also download the MyChart app! Go to the app store, search "MyChart", open the app, select [facility], and log in with your MyChart username and password. ? ?Due to Covid, a mask is required upon entering the hospital/clinic. If you do not have a mask, one will be given to you upon arrival. For doctor visits, patients may have 1 support person aged 73 or older with them. For treatment visits, patients cannot have anyone with them due to current Covid guidelines and our immunocompromised population.  ?

## 2021-08-24 NOTE — Progress Notes (Signed)
Patient presents today for Daratumumab injection per providers order.  Vital signs within parameters for treatment.  Labs pending. ? ?Patient had some questions pertaining her disease process and treatment.  Anastasio Champion RN to speak with patient. ? ?Labs within parameters for treatment.  Platelets noted to be 82, MD notified.  Message received from Adonis Huguenin RN/Dr. Delton Coombes patient okay for treatment. ? ?Daratumumab administration without incident; injection site WNL; see MAR for injection details.  Patient tolerated procedure well and without incident.  No questions or complaints noted at this time. Discharge from clinic ambulatory in stable condition.  Alert and oriented X 3.  Follow up with [facility] as scheduled.  ? ? ?

## 2021-08-26 ENCOUNTER — Other Ambulatory Visit (HOSPITAL_COMMUNITY): Payer: Self-pay

## 2021-08-26 ENCOUNTER — Ambulatory Visit (HOSPITAL_COMMUNITY): Payer: Medicare Other

## 2021-08-26 MED ORDER — LENALIDOMIDE 15 MG PO CAPS
15.0000 mg | ORAL_CAPSULE | Freq: Every day | ORAL | 0 refills | Status: DC
Start: 2021-08-26 — End: 2021-09-24

## 2021-08-26 NOTE — Chronic Care Management (AMB) (Signed)
?  Care Management  ? ?Note ? ?08/26/2021 ?Name: Courtney Rogers MRN: [id number] DOB: [birth year] ? ?Courtney Rogers is a 71 y.o. year old female who is a primary care patient of Renee Rival, FNP and is actively engaged with the care management team. I reached out to [name] by phone today to assist with scheduling an initial visit with the RN Case Manager ? ?Follow up plan: ?Unsuccessful telephone outreach attempt made. A HIPAA compliant phone message was left for the patient providing contact information and requesting a return call.  ?The care management team will reach out to the patient again over the next 14 days.  ?If patient returns call to provider office, please advise to call [facility] at [phone] ? ?Laverda Sorenson  ?Care Guide, Embedded Care Coordination ?[facility]  Care Management  ?Direct Dial: [phone] ? ?

## 2021-08-26 NOTE — Telephone Encounter (Signed)
Chart reviewed. Revlimid refilled per last office note with Dr. Katragadda.  

## 2021-08-30 ENCOUNTER — Other Ambulatory Visit (HOSPITAL_COMMUNITY): Payer: Medicare Other

## 2021-08-30 ENCOUNTER — Ambulatory Visit (HOSPITAL_COMMUNITY): Payer: Medicare Other

## 2021-08-30 ENCOUNTER — Ambulatory Visit (HOSPITAL_COMMUNITY): Payer: Medicare Other | Admitting: Hematology

## 2021-08-30 ENCOUNTER — Encounter (HOSPITAL_COMMUNITY): Payer: Self-pay | Admitting: *Deleted

## 2021-08-30 ENCOUNTER — Emergency Department (HOSPITAL_COMMUNITY)
Admission: EM | Admit: 2021-08-30 | Discharge: 2021-08-30 | Disposition: A | Discharge status: Home / Self Care | Payer: Medicare Other | Attending: Emergency Medicine | Admitting: Emergency Medicine

## 2021-08-30 ENCOUNTER — Other Ambulatory Visit: Payer: Self-pay

## 2021-08-30 DIAGNOSIS — M542 Cervicalgia: Secondary | ICD-10-CM | POA: Insufficient documentation

## 2021-08-30 DIAGNOSIS — M549 Dorsalgia, unspecified: Secondary | ICD-10-CM | POA: Diagnosis not present

## 2021-08-30 DIAGNOSIS — Z7901 Long term (current) use of anticoagulants: Secondary | ICD-10-CM | POA: Insufficient documentation

## 2021-08-30 DIAGNOSIS — I2510 Atherosclerotic heart disease of native coronary artery without angina pectoris: Secondary | ICD-10-CM | POA: Diagnosis not present

## 2021-08-30 DIAGNOSIS — M7918 Myalgia, other site: Secondary | ICD-10-CM

## 2021-08-30 DIAGNOSIS — Z79899 Other long term (current) drug therapy: Secondary | ICD-10-CM | POA: Diagnosis not present

## 2021-08-30 DIAGNOSIS — I10 Essential (primary) hypertension: Secondary | ICD-10-CM | POA: Insufficient documentation

## 2021-08-30 DIAGNOSIS — Z7982 Long term (current) use of aspirin: Secondary | ICD-10-CM | POA: Insufficient documentation

## 2021-08-30 MED ORDER — CYCLOBENZAPRINE HCL 10 MG PO TABS: 5.0000 mg | ORAL_TABLET | Freq: Two times a day (BID) | ORAL | 0 refills | Status: DC | PRN

## 2021-08-30 MED ORDER — CYCLOBENZAPRINE HCL 10 MG PO TABS
10.0000 mg | ORAL_TABLET | Freq: Once | ORAL | Status: AC
  Administered 2021-08-30: 10 mg via ORAL
  Filled 2021-08-30: qty 1

## 2021-08-30 NOTE — ED Notes (Addendum)
Pt with neck, back of head and bilateral shoulders pain off and on for awhile, worse for the past week. Taking tylenol at home. Unable to see PCP for this per pt. ?

## 2021-08-30 NOTE — ED Provider Notes (Signed)
?[facility] ?Provider Note ? ? ?CSN: [id number] ?Arrival date & time: 08/30/21  0946 ? ?  ? ?History ? ?Chief Complaint  ?Patient presents with  ? Neck Pain  ? ? ?Courtney Rogers is a 71 y.o. female. ? ?HPI ? ?Patient with medical history including anxiety, hypertension, CAD, currently on warfarin, recently diagnosed with multiple myeloma currently receiving chemotherapy presents with complaints of neck and back pain.  Patient states she has been having neck and back pain for last few weeks but over the last week pain has gotten much worse.  She states she mainly feels pain on her neck and in between her shoulder blades.  Pain is worsened with movement improved with rest she has no associated headaches change in vision paresthesias or weakness the upper or lower extremities no lightheaded dizziness nausea or vomiting no recent head trauma, states she has been compliant with her warfarin.  She denies any associated fevers or chills, she denies any associated trauma with this pain.  ? ?I reviewed patient's chart followed by Dr. Delton Coombes, currently getting chemotherapy for multiple myeloma, patient had a CMP CBC performed on 4 April which showed her lab work was baseline for her. ? ?Home Medications ?Prior to Admission medications   ?Medication Sig Start Date End Date Taking? Authorizing Provider  ?cyclobenzaprine (FLEXERIL) 10 MG tablet Take 0.5 tablets (5 mg total) by mouth 2 (two) times daily as needed for muscle spasms. 08/30/21  Yes Marcello Fennel, PA-C  ?acetaminophen (TYLENOL) 500 MG tablet Take 1,000 mg by mouth 2 (two) times daily as needed for moderate pain or headache.    [provider]  ?acyclovir (ZOVIRAX) 400 MG tablet Take 1 tablet (400 mg total) by mouth 2 (two) times daily. 08/09/21   Derek Jack, MD  ?albuterol (PROVENTIL HFA;VENTOLIN HFA) 108 (90 BASE) MCG/ACT inhaler Inhale 2 puffs into the lungs every 6 (six) hours as needed for shortness of breath.     [provider]  ?amLODipine (NORVASC) 10 MG tablet Take 1 tablet (10 mg total) by mouth daily. 07/15/20   Noreene Larsson, NP  ?aspirin EC 81 MG tablet Take 81 mg by mouth at bedtime.    [provider]  ?B Complex Vitamins (VITAMIN B COMPLEX) TABS Take 1 tablet by mouth daily.    [provider]  ?bortezomib SQ (VELCADE) 3.5 MG injection Inject into the skin once. 08/10/21   [provider]  ?busPIRone (BUSPAR) 5 MG tablet Take 5 mg by mouth daily. 11/25/16   [provider]  ?Calcium Carbonate-Vitamin D (CALCIUM 600+D PO) Take 1 tablet by mouth 2 (two) times daily.    [provider]  ?chlorthalidone (HYGROTON) 25 MG tablet Take 25 mg by mouth daily.     [provider]  ?cholecalciferol (VITAMIN D) 25 MCG (1000 UNIT) tablet Take 1,000 Units by mouth daily. 10/15/20   [provider]  ?Daratumumab-Hyaluronidase-fihj (DARZALEX FASPRO [employer]) Inject into the skin. 08/10/21   [provider]  ?fluticasone (FLONASE) 50 MCG/ACT nasal spray Place 2 sprays into both nostrils daily. 03/18/21   Fayrene Helper, MD  ?fluticasone-salmeterol (ADVAIR) 250-50 MCG/ACT AEPB Inhale 1 puff into the lungs 2 (two) times daily. 10/12/20   [provider]  ?folic acid (FOLVITE) 1 MG tablet TAKE 1 TABLET BY MOUTH ONCE DAILY. ?Patient taking differently: Take 1 mg by mouth daily. 02/09/21   Derek Jack, MD  ?gabapentin (NEURONTIN) 600 MG tablet Take 600 mg by mouth 2 (two)  times daily. 05/07/19   [provider]  ?lenalidomide (REVLIMID) 15 MG capsule Take 1 capsule (15 mg total) by mouth daily. 21 days on, 7 days off every 28 days 08/26/21   Derek Jack, MD  ?lidocaine (XYLOCAINE) 2 % solution TAKE 2 TEASPOONFULS BEFORE MEALS AND AT BEDTIME AS NEEDED MAY REPEAT EVERY 4 HOURS. (MAX OF 8 DOSES PER DAY) 06/02/21   Mahala Menghini, PA-C  ?Lidocaine HCl (XOLIDO) 2 % CREA Apply 1 application topically daily as needed (arthritisis).     [provider]  ?losartan (COZAAR) 100 MG tablet Take 100 mg by mouth daily. 01/16/21   [provider]  ?metoprolol tartrate (LOPRESSOR) 50 MG tablet Take 1 tablet (50 mg total) by mouth 2 (two) times daily. 06/18/21   Renee Rival, FNP  ?mirtazapine (REMERON) 15 MG tablet Take 15 mg by mouth at bedtime. 09/18/20   [provider]  ?montelukast (SINGULAIR) 10 MG tablet Take 10 mg by mouth daily.     [provider]  ?nitroGLYCERIN (NITROSTAT) 0.4 MG SL tablet Place 0.4 mg under the tongue every 5 (five) minutes as needed for chest pain.    [provider]  ?pantoprazole (PROTONIX) 40 MG tablet Take 40 mg by mouth daily. 08/10/20   [provider]  ?potassium chloride (K-DUR,KLOR-CON) 10 MEQ tablet Take 10 mEq by mouth daily.    [provider]  ?primidone (MYSOLINE) 50 MG tablet Take 50 mg by mouth daily. 03/31/21   [provider]  ?Probiotic Product (GNP PROBIOTIC COLON SUPPORT) CAPS Take 1 capsule by mouth daily. 07/08/21   [provider]  ?Probiotic Product (PROBIOTIC PO) Take 1 capsule by mouth daily. [facility].    [provider]  ?prochlorperazine (COMPAZINE) 10 MG tablet Take 1 tablet (10 mg total) by mouth every 6 (six) hours as needed (Nausea or vomiting). 08/09/21   Derek Jack, MD  ?sertraline (ZOLOFT) 100 MG tablet Take 200 mg by mouth daily.     [provider]  ?Simethicone (GAS-X PO) Take 2 tablets by mouth daily as needed (gas).    [provider]  ?simvastatin (ZOCOR) 40 MG tablet Take 40 mg by mouth at bedtime.    [provider]  ?vitamin B-12 (CYANOCOBALAMIN) 1000 MCG tablet Take 1,000 mcg by mouth daily. 10/12/20   [provider]  ?vitamin C (ASCORBIC ACID) 500 MG tablet Take 500 mg by mouth daily.    [provider]  ?warfarin (COUMADIN) 3 MG tablet Take by mouth. 06/23/21   [provider]  ?   ? ?Allergies    ?Amoxicillin-pot  clavulanate, Biaxin [clarithromycin], Erythromycin, and Lisinopril   ? ?Review of Systems   ?Review of Systems  ?Constitutional:  Negative for chills and fever.  ?Respiratory:  Negative for shortness of breath.   ?Cardiovascular:  Negative for chest pain.  ?Gastrointestinal:  Negative for abdominal pain.  ?Musculoskeletal:  Positive for back pain and neck pain.  ?Neurological:  Negative for headaches.  ? ?Physical Exam ?Updated Vital Signs ?BP 128/76 (BP Location: Left Arm)   Pulse 68   Temp 98.4 ?F (36.9 ?C) (Oral)   Resp 16   Ht '5\' 5"'  (1.651 m)   Wt 76.7 kg   SpO2 99%   BMI 28.12 kg/m?  ?Physical Exam ?Vitals and nursing note reviewed.  ?Constitutional:   ?   General: She is not in acute distress. ?   Appearance: She is not ill-appearing.  ?HENT:  ?  Head: Normocephalic and atraumatic.  ?   Nose: No congestion.  ?Eyes:  ?   Extraocular Movements: Extraocular movements intact.  ?   Conjunctiva/sclera: Conjunctivae normal.  ?   Pupils: Pupils are equal, round, and reactive to light.  ?Cardiovascular:  ?   Rate and Rhythm: Normal rate and regular rhythm.  ?   Pulses: Normal pulses.  ?   Heart sounds: No murmur heard. ?  No friction rub. No gallop.  ?Pulmonary:  ?   Effort: No respiratory distress.  ?   Breath sounds: No wheezing, rhonchi or rales.  ?Musculoskeletal:  ?   Comments: Moving all 4 extremities, neurovascular fully intact 2+ radial pulses 2+ dorsal pedal pulses.  Spine was palpated was nontender to palpation no step-off deformities noted, no overlying skin changes present.  She was notably tender within the musculature surrounding the cervical spine.  Pain was localized perispinal muscles between the scapula, and reproducible.  ?Skin: ?   General: Skin is warm and dry.  ?Neurological:  ?   Mental Status: She is alert.  ?   GCS: GCS eye subscore is 4. GCS verbal subscore is 5. GCS motor subscore is 6.  ?   Cranial Nerves: No cranial nerve deficit.  ?   Sensory: Sensation is intact.  ?   Motor: No  weakness.  ?   Coordination: Finger-Nose-Finger Test and Heel to Anderson Test normal.  ?   Gait: Gait is intact.  ?   Comments: Cranial nerves II through XII grossly intact no difficulty word finding, following two-ste

## 2021-08-30 NOTE — ED Triage Notes (Signed)
Pt c/o pain to her posterior neck and bilateral shoulder blades x 1 week. Pt reports she was diagnosed with multiple myeloma on March 6 and is concerned the pain may have something to do with this diagnosis.  ?

## 2021-08-30 NOTE — Discharge Instructions (Signed)
You have been seen here for neck and back pain, I recommend taking over-the-counter pain medications like ibuprofen and/or Tylenol every 6 as needed.  Please follow dosage and on the back of bottle.  I also recommend applying heat to the area and stretching out the muscles as this will help decrease stiffness and pain.  I have given you information on exercises please follow. ? ? ? I have also given you a prescription for a muscle relaxer this can make you drowsy do not consume alcohol or operate heavy machinery when taking this medication.  ? ?Please follow your PCP for further evaluation. ? ?Come back to the emergency department if you develop chest pain, shortness of breath, severe abdominal pain, uncontrolled nausea, vomiting, diarrhea. ? ? ?

## 2021-08-31 ENCOUNTER — Inpatient Hospital Stay (HOSPITAL_BASED_OUTPATIENT_CLINIC_OR_DEPARTMENT_OTHER): Payer: Medicare Other | Admitting: Hematology

## 2021-08-31 ENCOUNTER — Inpatient Hospital Stay (HOSPITAL_COMMUNITY): Payer: Medicare Other

## 2021-08-31 ENCOUNTER — Ambulatory Visit (HOSPITAL_COMMUNITY): Payer: Medicare Other | Admitting: Hematology

## 2021-08-31 ENCOUNTER — Ambulatory Visit (HOSPITAL_COMMUNITY): Payer: Medicare Other

## 2021-08-31 ENCOUNTER — Other Ambulatory Visit (HOSPITAL_COMMUNITY): Payer: Medicare Other

## 2021-08-31 VITALS — BP 99/58 | HR 63 | Temp 98.5°F | Resp 18 | Ht 63.5 in | Wt 162.8 lb

## 2021-08-31 DIAGNOSIS — C9000 Multiple myeloma not having achieved remission: Secondary | ICD-10-CM

## 2021-08-31 DIAGNOSIS — Z5112 Encounter for antineoplastic immunotherapy: Secondary | ICD-10-CM | POA: Diagnosis not present

## 2021-08-31 LAB — CBC WITH DIFFERENTIAL/PLATELET
Abs Immature Granulocytes: 0.03 10*3/uL (ref 0.00–0.07)
Basophils Absolute: 0.1 10*3/uL (ref 0.0–0.1)
Basophils Relative: 2 %
Eosinophils Absolute: 0.3 10*3/uL (ref 0.0–0.5)
Eosinophils Relative: 9 %
HCT: 31.4 % — ABNORMAL LOW (ref 36.0–46.0)
Hemoglobin: 10.2 g/dL — ABNORMAL LOW (ref 12.0–15.0)
Immature Granulocytes: 1 %
Lymphocytes Relative: 16 %
Lymphs Abs: 0.5 10*3/uL — ABNORMAL LOW (ref 0.7–4.0)
MCH: 32.9 pg (ref 26.0–34.0)
MCHC: 32.5 g/dL (ref 30.0–36.0)
MCV: 101.3 fL — ABNORMAL HIGH (ref 80.0–100.0)
Monocytes Absolute: 0.8 10*3/uL (ref 0.1–1.0)
Monocytes Relative: 24 %
Neutro Abs: 1.6 10*3/uL — ABNORMAL LOW (ref 1.7–7.7)
Neutrophils Relative %: 48 %
Platelets: 142 10*3/uL — ABNORMAL LOW (ref 150–400)
RBC: 3.1 MIL/uL — ABNORMAL LOW (ref 3.87–5.11)
RDW: 15.9 % — ABNORMAL HIGH (ref 11.5–15.5)
WBC: 3.2 10*3/uL — ABNORMAL LOW (ref 4.0–10.5)
nRBC: 0 % (ref 0.0–0.2)

## 2021-08-31 LAB — COMPREHENSIVE METABOLIC PANEL
ALT: 15 U/L (ref 0–44)
AST: 16 U/L (ref 15–41)
Albumin: 4.2 g/dL (ref 3.5–5.0)
Alkaline Phosphatase: 89 U/L (ref 38–126)
Anion gap: 8 (ref 5–15)
BUN: 20 mg/dL (ref 8–23)
CO2: 30 mmol/L (ref 22–32)
Calcium: 8.6 mg/dL — ABNORMAL LOW (ref 8.9–10.3)
Chloride: 99 mmol/L (ref 98–111)
Creatinine, Ser: 0.98 mg/dL (ref 0.44–1.00)
GFR, Estimated: >60 mL/min (ref >60)
Glucose, Bld: 89 mg/dL (ref 70–99)
Potassium: 3.6 mmol/L (ref 3.5–5.1)
Sodium: 137 mmol/L (ref 135–145)
Total Bilirubin: 0.7 mg/dL (ref 0.3–1.2)
Total Protein: 7.4 g/dL (ref 6.5–8.1)

## 2021-08-31 LAB — LACTATE DEHYDROGENASE: LDH: 118 U/L (ref 98–192)

## 2021-08-31 LAB — MAGNESIUM: Magnesium: 2.2 mg/dL (ref 1.7–2.4)

## 2021-08-31 MED ORDER — BORTEZOMIB CHEMO SQ INJECTION 3.5 MG (2.5MG/ML)
1.3000 mg/m2 | Freq: Once | INTRAMUSCULAR | Status: AC
  Administered 2021-08-31: 2.25 mg via SUBCUTANEOUS
  Filled 2021-08-31: qty 0.9

## 2021-08-31 MED ORDER — DIPHENHYDRAMINE HCL 25 MG PO CAPS
25.0000 mg | ORAL_CAPSULE | Freq: Once | ORAL | Status: AC
  Administered 2021-08-31: 25 mg via ORAL
  Filled 2021-08-31: qty 1

## 2021-08-31 MED ORDER — DEXAMETHASONE 4 MG PO TABS
20.0000 mg | ORAL_TABLET | Freq: Once | ORAL | Status: AC
  Administered 2021-08-31: 20 mg via ORAL
  Filled 2021-08-31: qty 5

## 2021-08-31 MED ORDER — ACETAMINOPHEN 325 MG PO TABS
650.0000 mg | ORAL_TABLET | Freq: Once | ORAL | Status: AC
  Administered 2021-08-31: 650 mg via ORAL
  Filled 2021-08-31: qty 2

## 2021-08-31 MED ORDER — DARATUMUMAB-HYALURONIDASE-FIHJ 1800-30000 MG-UT/15ML ~~LOC~~ SOLN
1800.0000 mg | Freq: Once | SUBCUTANEOUS | Status: AC
  Administered 2021-08-31: 1800 mg via SUBCUTANEOUS
  Filled 2021-08-31: qty 15

## 2021-08-31 NOTE — Progress Notes (Signed)
Patient tolerated Velcade injection with no complaints voiced. Lab work reviewed. See MAR for details. Injection site clean and dry with no bruising or swelling noted. Patient stable during and after injection. Band aid applied.  ? ?Patient tolerated Daratumumab injection with no complaints voiced. See MAR for details. Lab reviewed. Injection site clean and dry with no bruising or swelling noted at site. Band aid applied. Vss with discharge and left in satisfactory condition with nos/s of distress noted.   ?

## 2021-08-31 NOTE — Patient Instructions (Signed)
[facility]  Discharge Instructions: ?Thank you for choosing [facility] to provide your oncology and hematology care.  ?If you have a lab appointment with the [facility], please come in thru the Main Entrance and check in at the main information desk. ? ?Wear comfortable clothing and clothing appropriate for easy access to any Portacath or PICC line.  ? ?We strive to give you quality time with your provider. You may need to reschedule your appointment if you arrive late (15 or more minutes).  Arriving late affects you and other patients whose appointments are after yours.  Also, if you miss three or more appointments without notifying the office, you may be dismissed from the clinic at the provider?s discretion.    ?  ?For prescription refill requests, have your pharmacy contact our office and allow 72 hours for refills to be completed.   ? ?Today you received the following chemotherapy and/or immunotherapy agents Daratumumab and Velcade, return as scheduled. ?  ?To help prevent nausea and vomiting after your treatment, we encourage you to take your nausea medication as directed. ? ?BELOW ARE SYMPTOMS THAT SHOULD BE REPORTED IMMEDIATELY: ?*FEVER GREATER THAN 100.4 F (38 ?C) OR HIGHER ?*CHILLS OR SWEATING ?*NAUSEA AND VOMITING THAT IS NOT CONTROLLED WITH YOUR NAUSEA MEDICATION ?*UNUSUAL SHORTNESS OF BREATH ?*UNUSUAL BRUISING OR BLEEDING ?*URINARY PROBLEMS (pain or burning when urinating, or frequent urination) ?*BOWEL PROBLEMS (unusual diarrhea, constipation, pain near the anus) ?TENDERNESS IN MOUTH AND THROAT WITH OR WITHOUT PRESENCE OF ULCERS (sore throat, sores in mouth, or a toothache) ?UNUSUAL RASH, SWELLING OR PAIN  ?UNUSUAL VAGINAL DISCHARGE OR ITCHING  ? ?Items with * indicate a potential emergency and should be followed up as soon as possible or go to the Emergency Department if any problems should occur. ? ?Please show the CHEMOTHERAPY ALERT CARD or IMMUNOTHERAPY ALERT CARD at  check-in to the Emergency Department and triage nurse. ? ?Should you have questions after your visit or need to cancel or reschedule your appointment, please contact [facility] [phone]  and follow the prompts.  Office hours are 8:00 a.m. to 4:30 p.m. Monday - Friday. Please note that voicemails left after 4:00 p.m. may not be returned until the following business day.  We are closed weekends and major holidays. You have access to a nurse at all times for urgent questions. Please call the main number to the clinic [phone] and follow the prompts. ? ?For any non-urgent questions, you may also contact your provider using MyChart. We now offer e-Visits for anyone 69 and older to request care online for non-urgent symptoms. For details visit [email or web address]. ?  ?Also download the MyChart app! Go to the app store, search "MyChart", open the app, select [facility], and log in with your MyChart username and password. ? ?Due to Covid, a mask is required upon entering the hospital/clinic. If you do not have a mask, one will be given to you upon arrival. For doctor visits, patients may have 1 support person aged 7 or older with them. For treatment visits, patients cannot have anyone with them due to current Covid guidelines and our immunocompromised population.  ?

## 2021-08-31 NOTE — Patient Instructions (Signed)
[facility] at [facility] ?Discharge Instructions ? ? ?You were seen and examined today by Dr. Delton Coombes. ? ?He reviewed your lab work which is normal/stable. ? ?We will proceed with treatment today. ? ?We will start you on a shot called Xgeva. It is to help strengthen your bones. Continue taking calcium and vitamin D.  ? ?Return as scheduled.  ? ? ?Thank you for choosing [facility] at [facility] to provide your oncology and hematology care.  To afford each patient quality time with our provider, please arrive at least 15 minutes before your scheduled appointment time.  ? ?If you have a lab appointment with the [facility] please come in thru the Main Entrance and check in at the main information desk. ? ?You need to re-schedule your appointment should you arrive 10 or more minutes late.  We strive to give you quality time with our providers, and arriving late affects you and other patients whose appointments are after yours.  Also, if you no show three or more times for appointments you may be dismissed from the clinic at the providers discretion.     ?Again, thank you for choosing [facility].  Our hope is that these requests will decrease the amount of time that you wait before being seen by our physicians.       ?_____________________________________________________________ ? ?Should you have questions after your visit to [facility], please contact our office at [phone] and follow the prompts.  Our office hours are 8:00 a.m. and 4:30 p.m. Monday - Friday.  Please note that voicemails left after 4:00 p.m. may not be returned until the following business day.  We are closed weekends and major holidays.  You do have access to a nurse 24-7, just call the main number to the clinic [phone] and do not press any options, hold on the line and a nurse will answer the phone.   ? ?For prescription refill requests, have your pharmacy  contact our office and allow 72 hours.   ? ?Due to Covid, you will need to wear a mask upon entering the hospital. If you do not have a mask, a mask will be given to you at the Main Entrance upon arrival. For doctor visits, patients may have 1 support person age 48 or older with them. For treatment visits, patients can not have anyone with them due to social distancing guidelines and our immunocompromised population.  ? ?   ?

## 2021-08-31 NOTE — Progress Notes (Signed)
Chaplain engaged in a follow-up visit with Courtney Rogers, checking in on her.  She stated that she has been doing "ok."  She is well supported by her daughter who lives close to her and she also has two sons.  She shared about wanting to see them more throughout this time in her life.   ? ?Chaplain could assess that Courtney Rogers is able to keep her spirit up through laughter, fun and jokes.  Courtney Rogers needed to speak with the nurse and the incoming doctor.  Chaplain showed appreciation for seeing Courtney Rogers again and will continue to follow-up.  ? ?Chaplain offered compassionate presence, follow-up care, and reflective listening. ? ? ? 08/31/21 1100  ?Clinical Encounter Type  ?Visited With Patient  ?Visit Type Follow-up;Spiritual support  ? ? ?

## 2021-08-31 NOTE — Progress Notes (Signed)
Patient has been examined by Dr. Katragadda, and vital signs and labs have been reviewed. ANC, Creatinine, LFTs, hemoglobin, and platelets are within treatment parameters per M.D. - pt may proceed with treatment.    °

## 2021-08-31 NOTE — Progress Notes (Signed)
? ?[facility] ?[address] ?[place], [place] [address] ? ? ?CLINIC:  ?Medical Oncology/Hematology ? ?PCP:  ?Renee Rival, FNP ?[address] / [place] [address] ?[phone] ? ? ?REASON FOR VISIT:  ?Follow-up for multiple myeloma ? ?PRIOR THERAPY: none ? ?NGS Results: not done ? ?CURRENT THERAPY: DaraVRd (Daratumumab SQ) q21d x 6 Cycles  ? ?BRIEF ONCOLOGIC HISTORY:  ?Oncology History  ?IgA myeloma ([facility])  ?05/06/2021 Initial Diagnosis  ? IgA myeloma ([facility]) ?  ?08/10/2021 -  Chemotherapy  ? Patient is on Treatment Plan : MYELOMA NEWLY DIAGNOSED TRANSPLANT CANDIDATE DaraVRd (Daratumumab SQ) q21d x 6 Cycles (Induction/Consolidation)  ?   ? ? ?CANCER STAGING: ? Cancer Staging  ?No matching staging information was found for the patient. ? ?INTERVAL HISTORY:  ?Courtney Rogers, a 71 y.o. female, returns for routine follow-up and consideration for next cycle of chemotherapy. Courtney Rogers was last seen on 08/10/2021. ? ?Due for cycle #2 of Velcade and Darzalex Faspro today.  ? ?Overall, she tells me she has been feeling pretty well. She reports pain in her back of her head and shoulders. She reports occasional constipation which is stable. She is taking Revlimid and tolerating it well. She denies numbness/tingling and infections. She reports occasional fatigue in the evening. Her appetite is fair. She denies hip pain. She denies dental issues, and she sees a dentist regularly.  ? ?Overall, she feels ready for next cycle of chemo today.  ? ? ?REVIEW OF SYSTEMS:  ?Review of Systems  ?Constitutional:  Positive for fatigue (occasional). Negative for appetite change.  ?Gastrointestinal:  Positive for constipation.  ?Genitourinary:  Positive for frequency.   ?Musculoskeletal:  Positive for arthralgias (shoulders) and neck pain.  ?Neurological:  Positive for headaches (back of head). Negative for numbness.  ?All other systems reviewed and are negative. ? ?PAST MEDICAL/SURGICAL HISTORY:   ?Past Medical History:  ?Diagnosis Date  ? Acute myocardial infarction [facility]) 2009  ? CAD/no stent medically managed  ? Anxiety disorder   ? CAD (coronary artery disease)   ? Hyperlipidemia   ? Hypertension   ? IBS (irritable bowel syndrome)   ? Non-ulcer dyspepsia 04/30/2009  ? Qualifier: Diagnosis of  By: Oneida Alar MD, Sandi L   ? Overactive bladder   ? PE (pulmonary embolism) JUN 2014  ? Presence of permanent cardiac pacemaker   ? Sleep apnea   ? ?Past Surgical History:  ?Procedure Laterality Date  ? ABDOMINAL HYSTERECTOMY    ? BSO secondary to cyst    ? CARDIAC CATHETERIZATION    ? COLONOSCOPY  12/2009  ? Dr. West Carbo, propofol, normal. Next TCS 12/2019  ? COLONOSCOPY N/A 05/22/2017  ? Examined portion ileum was normal, significant looping of colon, external hemorrhoids and rectal bleeding due to internal hemorrhoids, mild diverticulosis procedure: COLONOSCOPY;  Surgeon: Danie Binder, MD;  [facility];  Service: Endoscopy;  Laterality: N/A;  10:30am  ? ESOPHAGOGASTRODUODENOSCOPY  05/11/2009  ? schatzki ring/small hiatal hernia/path:gastritis  ? ESOPHAGOGASTRODUODENOSCOPY N/A 05/22/2017  ? Multiple gastric polyps with biopsy benign fundic gland polyp, small bowel biopsy negative for celiac, gastric biopsy with mild gastritis but no H. pylori procedure: ESOPHAGOGASTRODUODENOSCOPY (EGD);  Surgeon: Danie Binder, MD;  Location: [facility];  Service: Endoscopy;  Laterality: N/A;  ? ESOPHAGOGASTRODUODENOSCOPY (EGD) WITH ESOPHAGEAL DILATION N/A 04/01/2013  ? CVE:[id number] at the gastroesophageal juction/multiple small polyps/mild gastritis  ? GIVENS CAPSULE STUDY N/A 06/07/2017  ? Frequent gastric erosions, normal small bowel mucosa.  Procedure: GIVENS CAPSULE STUDY;  Surgeon: Danie Binder, MD;  [facility]: [facility];  Service: Endoscopy;  Laterality: N/A;  7:30am  ? INSERT / REPLACE / REMOVE PACEMAKER    ? Last year per pt.(can't remember date)  ? 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 ? ?SOCIAL  HISTORY:  ?Social History  ? ?Socioeconomic History  ? Marital status: Married  ?  Spouse name: [name]  ? Number of children: 3  ? Years of education: 32  ? Highest education level: Not on file  ?Occupational History  ? Not on file  ?Tobacco Use  ? Smoking status: Never  ? Smokeless tobacco: Never  ? Tobacco comments:  ?  Never smoked   ?Vaping Use  ? Vaping Use: Never used  ?Substance and Sexual Activity  ? Alcohol use: Yes  ?  Alcohol/week: 0.0 standard drinks  ?  Comment: occ wine  ? Drug use: No  ? Sexual activity: Not Currently  ?Other Topics Concern  ? Not on file  ?Social History Narrative  ?   ? Lives with husband-51 years 28 in Aug 2021  ? Daughter is close by, 2 sons live further away  ? One IN [place],ONE IN [place], ONE BESIDE HER.    ?   ? 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  ? Enjoys: reading, young adult  ?   ? Diet: eats all food groups   ? Caffeine: coffee 1, tea daily soda-1 daily  ? Water: 1-2 cups  ?   ? Wears seat belt   ? Does not use phone while driving  ? Smoke detectors at home   ? Data processing manager  ? Weapons -locked up  ?   ? Left handed  ? One story home  ? Drinks caffeine  ? ?Social Determinants of Health  ? ?Financial Resource Strain: Low Risk   ? Difficulty of Paying Living Expenses: Not hard at all  ?Food Insecurity: No Food Insecurity  ? Worried About Charity fundraiser in the Last Year: Never true  ? Ran Out of Food in the Last Year: Never true  ?Transportation Needs: No Transportation Needs  ? Lack of Transportation (Medical): No  ? Lack of Transportation (Non-Medical): No  ?Physical Activity: Inactive  ? Days of Exercise per Week: 0 days  ? Minutes of Exercise per Session: 0 min  ?Stress: Stress Concern Present  ? Feeling of Stress : Rather much  ?Social Connections: Moderately Integrated  ? Frequency of Communication with Friends and Family: Once a week  ? Frequency of Social Gatherings with Friends and Family: Once a week  ? Attends Religious Services: More than 4  times per year  ? Active Member of Clubs or Organizations: Yes  ? Attends Archivist Meetings: More than 4 times per year  ? Marital Status: Married  ?Intimate Partner Violence: Not At Risk  ? Fear of Current or Ex-Partner: No  ? Emotionally Abused: No  ? Physically Abused: No  ? Sexually Abused: No  ? ? ?FAMILY HISTORY:  ?Family History  ?Problem Relation Age of Onset  ? Colon polyps Neg Hx   ? Colon cancer Neg Hx   ? ? ?CURRENT MEDICATIONS:  ?Current Outpatient Medications  ?Medication Sig Dispense Refill  ? acetaminophen (TYLENOL) 500 MG tablet Take 1,000 mg by mouth 2 (two) times daily as needed for moderate pain or headache.    ? acyclovir (ZOVIRAX) 400 MG tablet Take 1 tablet (400 mg total) by mouth  2 (two) times daily. 60 tablet 6  ? albuterol (PROVENTIL HFA;VENTOLIN HFA) 108 (90 BASE) MCG/ACT inhaler Inhale 2 puffs into the lungs every 6 (six) hours as needed for shortness of breath.    ? amLODipine (NORVASC) 10 MG tablet Take 1 tablet (10 mg total) by mouth daily. 90 tablet 1  ? aspirin EC 81 MG tablet Take 81 mg by mouth at bedtime.    ? B Complex Vitamins (VITAMIN B COMPLEX) TABS Take 1 tablet by mouth daily.    ? bortezomib SQ (VELCADE) 3.5 MG injection Inject into the skin once.    ? busPIRone (BUSPAR) 5 MG tablet Take 5 mg by mouth daily.    ? Calcium Carbonate-Vitamin D (CALCIUM 600+D PO) Take 1 tablet by mouth 2 (two) times daily.    ? chlorthalidone (HYGROTON) 25 MG tablet Take 25 mg by mouth daily.     ? cholecalciferol (VITAMIN D) 25 MCG (1000 UNIT) tablet Take 1,000 Units by mouth daily.    ? cyclobenzaprine (FLEXERIL) 10 MG tablet Take 0.5 tablets (5 mg total) by mouth 2 (two) times daily as needed for muscle spasms. 20 tablet 0  ? Daratumumab-Hyaluronidase-fihj (DARZALEX FASPRO [facility]) Inject into the skin.    ? fluticasone (FLONASE) 50 MCG/ACT nasal spray Place 2 sprays into both nostrils daily. 16 g 6  ? fluticasone-salmeterol (ADVAIR) 250-50 MCG/ACT AEPB Inhale 1 puff into the lungs 2  (two) times daily.    ? folic acid (FOLVITE) 1 MG tablet TAKE 1 TABLET BY MOUTH ONCE DAILY. (Patient taking differently: Take 1 mg by mouth daily.) 30 tablet 11  ? gabapentin (NEURONTIN) 600 MG tablet

## 2021-09-01 LAB — PROTEIN ELECTROPHORESIS, SERUM
A/G Ratio: 1.2 (ref 0.7–1.7)
Albumin ELP: 3.7 g/dL (ref 2.9–4.4)
Alpha-1-Globulin: 0.3 g/dL (ref 0.0–0.4)
Alpha-2-Globulin: 0.7 g/dL (ref 0.4–1.0)
Beta Globulin: 0.9 g/dL (ref 0.7–1.3)
Gamma Globulin: 1.3 g/dL (ref 0.4–1.8)
Globulin, Total: 3.2 g/dL (ref 2.2–3.9)
M-Spike, %: 0.8 g/dL — ABNORMAL HIGH
Total Protein ELP: 6.9 g/dL (ref 6.0–8.5)

## 2021-09-01 LAB — KAPPA/LAMBDA LIGHT CHAINS
Kappa free light chain: 15.8 mg/L (ref 3.3–19.4)
Kappa, lambda light chain ratio: 1.72 — ABNORMAL HIGH (ref 0.26–1.65)
Lambda free light chains: 9.2 mg/L (ref 5.7–26.3)

## 2021-09-03 ENCOUNTER — Inpatient Hospital Stay (HOSPITAL_COMMUNITY): Payer: Medicare Other

## 2021-09-03 VITALS — BP 108/60 | HR 82 | Temp 97.5°F | Resp 18 | Wt 166.0 lb

## 2021-09-03 DIAGNOSIS — C9000 Multiple myeloma not having achieved remission: Secondary | ICD-10-CM

## 2021-09-03 DIAGNOSIS — Z5112 Encounter for antineoplastic immunotherapy: Secondary | ICD-10-CM | POA: Diagnosis not present

## 2021-09-03 MED ORDER — PROCHLORPERAZINE MALEATE 10 MG PO TABS
10.0000 mg | ORAL_TABLET | Freq: Once | ORAL | Status: AC
  Administered 2021-09-03: 10 mg via ORAL
  Filled 2021-09-03: qty 1

## 2021-09-03 MED ORDER — BORTEZOMIB CHEMO SQ INJECTION 3.5 MG (2.5MG/ML)
1.3000 mg/m2 | Freq: Once | INTRAMUSCULAR | Status: AC
  Administered 2021-09-03: 2.25 mg via SUBCUTANEOUS
  Filled 2021-09-03: qty 0.9

## 2021-09-03 NOTE — Progress Notes (Signed)
Patient presents today for Velcade injection.  Patient is in satisfactory condition with only complaints of neuropathy in hands and feet.  Vital signs are stable.  We will proceed with treatment per MD orders.  ? ?Patient tolerated injection with no complaints voiced.  Site clean and dry with no bruising or swelling noted.  No complaints of pain.  Discharged with vital signs stable and no signs or symptoms of distress noted.   ?

## 2021-09-03 NOTE — Patient Instructions (Signed)
[facility]  Discharge Instructions: Thank you for choosing [facility] to provide your oncology and hematology care.  If you have a lab appointment with the Cancer Center, please come in thru the Main Entrance and check in at the main information desk.  Wear comfortable clothing and clothing appropriate for easy access to any Portacath or PICC line.   We strive to give you quality time with your provider. You may need to reschedule your appointment if you arrive late (15 or more minutes).  Arriving late affects you and other patients whose appointments are after yours.  Also, if you miss three or more appointments without notifying the office, you may be dismissed from the clinic at the provider's discretion.      For prescription refill requests, have your pharmacy contact our office and allow 72 hours for refills to be completed.        To help prevent nausea and vomiting after your treatment, we encourage you to take your nausea medication as directed.  BELOW ARE SYMPTOMS THAT SHOULD BE REPORTED IMMEDIATELY: *FEVER GREATER THAN 100.4 F (38 C) OR HIGHER *CHILLS OR SWEATING *NAUSEA AND VOMITING THAT IS NOT CONTROLLED WITH YOUR NAUSEA MEDICATION *UNUSUAL SHORTNESS OF BREATH *UNUSUAL BRUISING OR BLEEDING *URINARY PROBLEMS (pain or burning when urinating, or frequent urination) *BOWEL PROBLEMS (unusual diarrhea, constipation, pain near the anus) TENDERNESS IN MOUTH AND THROAT WITH OR WITHOUT PRESENCE OF ULCERS (sore throat, sores in mouth, or a toothache) UNUSUAL RASH, SWELLING OR PAIN  UNUSUAL VAGINAL DISCHARGE OR ITCHING   Items with * indicate a potential emergency and should be followed up as soon as possible or go to the Emergency Department if any problems should occur.  Please show the CHEMOTHERAPY ALERT CARD or IMMUNOTHERAPY ALERT CARD at check-in to the Emergency Department and triage nurse.  Should you have questions after your visit or need to cancel  or reschedule your appointment, please contact [facility] [phone]  and follow the prompts.  Office hours are 8:00 a.m. to 4:30 p.m. Monday - Friday. Please note that voicemails left after 4:00 p.m. may not be returned until the following business day.  We are closed weekends and major holidays. You have access to a nurse at all times for urgent questions. Please call the main number to the clinic [phone] and follow the prompts.  For any non-urgent questions, you may also contact your provider using MyChart. We now offer e-Visits for anyone 18 and older to request care online for non-urgent symptoms. For details visit [email or web address].   Also download the MyChart app! Go to the app store, search "MyChart", open the app, select [facility], and log in with your MyChart username and password.  Due to Covid, a mask is required upon entering the hospital/clinic. If you do not have a mask, one will be given to you upon arrival. For doctor visits, patients may have 1 support person aged 18 or older with them. For treatment visits, patients cannot have anyone with them due to current Covid guidelines and our immunocompromised population.  

## 2021-09-06 NOTE — Chronic Care Management (AMB) (Signed)
?  Care Management  ? ?Note ? ?09/06/2021 ?Name: Courtney Rogers MRN: [id number] DOB: 09-17-1950 ? ?Courtney Rogers is a 71 y.o. year old female who is a primary care patient of Renee Rival, FNP and is actively engaged with the care management team. I reached out to [name] by phone today to assist with re-scheduling a follow up visit with the Licensed Clinical Social Worker ? ?Follow up plan: ?Telephone appointment with care management team member scheduled for:10/22/21 ? ?Laverda Sorenson  ?Care Guide, Embedded Care Coordination ?  Care Management  ?Direct Dial: [phone] ? ?

## 2021-09-07 ENCOUNTER — Inpatient Hospital Stay (HOSPITAL_COMMUNITY): Payer: Medicare Other

## 2021-09-07 VITALS — BP 114/69 | HR 68 | Temp 98.0°F | Resp 18

## 2021-09-07 DIAGNOSIS — C9000 Multiple myeloma not having achieved remission: Secondary | ICD-10-CM

## 2021-09-07 DIAGNOSIS — Z5112 Encounter for antineoplastic immunotherapy: Secondary | ICD-10-CM | POA: Diagnosis not present

## 2021-09-07 DIAGNOSIS — D509 Iron deficiency anemia, unspecified: Secondary | ICD-10-CM

## 2021-09-07 LAB — MAGNESIUM: Magnesium: 2.5 mg/dL — ABNORMAL HIGH (ref 1.7–2.4)

## 2021-09-07 LAB — CBC WITH DIFFERENTIAL/PLATELET
Abs Immature Granulocytes: 0 10*3/uL (ref 0.00–0.07)
Basophils Absolute: 0.1 10*3/uL (ref 0.0–0.1)
Basophils Relative: 3 %
Eosinophils Absolute: 0.1 10*3/uL (ref 0.0–0.5)
Eosinophils Relative: 4 %
HCT: 32.8 % — ABNORMAL LOW (ref 36.0–46.0)
Hemoglobin: 10.4 g/dL — ABNORMAL LOW (ref 12.0–15.0)
Immature Granulocytes: 0 %
Lymphocytes Relative: 24 %
Lymphs Abs: 0.8 10*3/uL (ref 0.7–4.0)
MCH: 32.5 pg (ref 26.0–34.0)
MCHC: 31.7 g/dL (ref 30.0–36.0)
MCV: 102.5 fL — ABNORMAL HIGH (ref 80.0–100.0)
Monocytes Absolute: 0.5 10*3/uL (ref 0.1–1.0)
Monocytes Relative: 16 %
Neutro Abs: 1.7 10*3/uL (ref 1.7–7.7)
Neutrophils Relative %: 53 %
Platelets: 141 10*3/uL — ABNORMAL LOW (ref 150–400)
RBC: 3.2 MIL/uL — ABNORMAL LOW (ref 3.87–5.11)
RDW: 15.8 % — ABNORMAL HIGH (ref 11.5–15.5)
WBC: 3.2 10*3/uL — ABNORMAL LOW (ref 4.0–10.5)
nRBC: 0 % (ref 0.0–0.2)

## 2021-09-07 LAB — COMPREHENSIVE METABOLIC PANEL
ALT: 13 U/L (ref 0–44)
AST: 17 U/L (ref 15–41)
Albumin: 3.9 g/dL (ref 3.5–5.0)
Alkaline Phosphatase: 91 U/L (ref 38–126)
Anion gap: 8 (ref 5–15)
BUN: 28 mg/dL — ABNORMAL HIGH (ref 8–23)
CO2: 27 mmol/L (ref 22–32)
Calcium: 8.6 mg/dL — ABNORMAL LOW (ref 8.9–10.3)
Chloride: 107 mmol/L (ref 98–111)
Creatinine, Ser: 1.15 mg/dL — ABNORMAL HIGH (ref 0.44–1.00)
GFR, Estimated: 51 mL/min — ABNORMAL LOW (ref >60)
Glucose, Bld: 101 mg/dL — ABNORMAL HIGH (ref 70–99)
Potassium: 3.2 mmol/L — ABNORMAL LOW (ref 3.5–5.1)
Sodium: 142 mmol/L (ref 135–145)
Total Bilirubin: 0.3 mg/dL (ref 0.3–1.2)
Total Protein: 6.9 g/dL (ref 6.5–8.1)

## 2021-09-07 MED ORDER — DIPHENHYDRAMINE HCL 25 MG PO CAPS
25.0000 mg | ORAL_CAPSULE | Freq: Once | ORAL | Status: AC
  Administered 2021-09-07: 25 mg via ORAL
  Filled 2021-09-07: qty 1

## 2021-09-07 MED ORDER — DARATUMUMAB-HYALURONIDASE-FIHJ 1800-30000 MG-UT/15ML ~~LOC~~ SOLN
1800.0000 mg | Freq: Once | SUBCUTANEOUS | Status: AC
  Administered 2021-09-07: 1800 mg via SUBCUTANEOUS
  Filled 2021-09-07: qty 15

## 2021-09-07 MED ORDER — BORTEZOMIB CHEMO SQ INJECTION 3.5 MG (2.5MG/ML)
1.3000 mg/m2 | Freq: Once | INTRAMUSCULAR | Status: AC
  Administered 2021-09-07: 2.25 mg via SUBCUTANEOUS
  Filled 2021-09-07: qty 0.9

## 2021-09-07 MED ORDER — DENOSUMAB 120 MG/1.7ML ~~LOC~~ SOLN
120.0000 mg | Freq: Once | SUBCUTANEOUS | Status: AC
  Administered 2021-09-07: 120 mg via SUBCUTANEOUS
  Filled 2021-09-07: qty 1.7

## 2021-09-07 MED ORDER — ACETAMINOPHEN 325 MG PO TABS
650.0000 mg | ORAL_TABLET | Freq: Once | ORAL | Status: AC
  Administered 2021-09-07: 650 mg via ORAL
  Filled 2021-09-07: qty 2

## 2021-09-07 MED ORDER — POTASSIUM CHLORIDE CRYS ER 20 MEQ PO TBCR
40.0000 meq | EXTENDED_RELEASE_TABLET | Freq: Once | ORAL | Status: AC
  Administered 2021-09-07: 40 meq via ORAL
  Filled 2021-09-07: qty 2

## 2021-09-07 MED ORDER — DEXAMETHASONE 4 MG PO TABS
20.0000 mg | ORAL_TABLET | Freq: Once | ORAL | Status: AC
  Administered 2021-09-07: 20 mg via ORAL
  Filled 2021-09-07: qty 5

## 2021-09-07 NOTE — Patient Instructions (Signed)
[facility]  Discharge Instructions: ?Thank you for choosing [facility] to provide your oncology and hematology care.  ?If you have a lab appointment with the [facility], please come in thru the Main Entrance and check in at the main information desk. ? ?Wear comfortable clothing and clothing appropriate for easy access to any Portacath or PICC line.  ? ?We strive to give you quality time with your provider. You may need to reschedule your appointment if you arrive late (15 or more minutes).  Arriving late affects you and other patients whose appointments are after yours.  Also, if you miss three or more appointments without notifying the office, you may be dismissed from the clinic at the provider?s discretion.    ?  ?For prescription refill requests, have your pharmacy contact our office and allow 72 hours for refills to be completed.   ? ?Today you received the following chemotherapy and/or immunotherapy agents Dara SQ, Velcade, and Xgeva injection. ?  ?To help prevent nausea and vomiting after your treatment, we encourage you to take your nausea medication as directed. ? ?BELOW ARE SYMPTOMS THAT SHOULD BE REPORTED IMMEDIATELY: ?*FEVER GREATER THAN 100.4 F (38 ?C) OR HIGHER ?*CHILLS OR SWEATING ?*NAUSEA AND VOMITING THAT IS NOT CONTROLLED WITH YOUR NAUSEA MEDICATION ?*UNUSUAL SHORTNESS OF BREATH ?*UNUSUAL BRUISING OR BLEEDING ?*URINARY PROBLEMS (pain or burning when urinating, or frequent urination) ?*BOWEL PROBLEMS (unusual diarrhea, constipation, pain near the anus) ?TENDERNESS IN MOUTH AND THROAT WITH OR WITHOUT PRESENCE OF ULCERS (sore throat, sores in mouth, or a toothache) ?UNUSUAL RASH, SWELLING OR PAIN  ?UNUSUAL VAGINAL DISCHARGE OR ITCHING  ? ?Items with * indicate a potential emergency and should be followed up as soon as possible or go to the Emergency Department if any problems should occur. ? ?Please show the CHEMOTHERAPY ALERT CARD or IMMUNOTHERAPY ALERT CARD at  check-in to the Emergency Department and triage nurse. ? ?Should you have questions after your visit or need to cancel or reschedule your appointment, please contact [facility] [phone]  and follow the prompts.  Office hours are 8:00 a.m. to 4:30 p.m. Monday - Friday. Please note that voicemails left after 4:00 p.m. may not be returned until the following business day.  We are closed weekends and major holidays. You have access to a nurse at all times for urgent questions. Please call the main number to the clinic [phone] and follow the prompts. ? ?For any non-urgent questions, you may also contact your provider using MyChart. We now offer e-Visits for anyone 67 and older to request care online for non-urgent symptoms. For details visit [email or web address]. ?  ?Also download the MyChart app! Go to the app store, search "MyChart", open the app, select [facility], and log in with your MyChart username and password. ? ?Due to Covid, a mask is required upon entering the hospital/clinic. If you do not have a mask, one will be given to you upon arrival. For doctor visits, patients may have 1 support person aged 63 or older with them. For treatment visits, patients cannot have anyone with them due to current Covid guidelines and our immunocompromised population.  ?

## 2021-09-07 NOTE — Progress Notes (Signed)
Pt presents today for Dara SQ, Velcade and Xgeva per provider's order. Vital signs and labs WNL for treatment today. Pt's potassium was 3.2 today pee Dr.K's standing order, pt will receive potassium 9mq p.o x 1 dose today. ? ?Pt denies any tooth or jaw pain. No recent or future dental appointments at this time. Pt reports taking Calcium and Vit D supplements as directed.  ? ?Dara SQ, Velcade, Xgeva, and potassium chloride 448m p.o x 1 dose given today per MD orders. Tolerated infusion without adverse affects. Vital signs stable. No complaints at this time. Discharged from clinic ambulatory with walker in stable condition. Alert and oriented x 3. [facility].   ?

## 2021-09-10 ENCOUNTER — Telehealth: Payer: BC Managed Care – PPO

## 2021-09-10 ENCOUNTER — Encounter (HOSPITAL_COMMUNITY): Payer: Self-pay

## 2021-09-10 ENCOUNTER — Inpatient Hospital Stay (HOSPITAL_COMMUNITY): Payer: Medicare Other

## 2021-09-10 VITALS — BP 112/70 | HR 79 | Temp 97.7°F | Resp 18 | Wt 174.4 lb

## 2021-09-10 DIAGNOSIS — Z5112 Encounter for antineoplastic immunotherapy: Secondary | ICD-10-CM | POA: Diagnosis not present

## 2021-09-10 DIAGNOSIS — C9000 Multiple myeloma not having achieved remission: Secondary | ICD-10-CM

## 2021-09-10 MED ORDER — BORTEZOMIB CHEMO SQ INJECTION 3.5 MG (2.5MG/ML)
1.3000 mg/m2 | Freq: Once | INTRAMUSCULAR | Status: AC
  Administered 2021-09-10: 2.25 mg via SUBCUTANEOUS
  Filled 2021-09-10: qty 0.9

## 2021-09-10 MED ORDER — PROCHLORPERAZINE MALEATE 10 MG PO TABS
10.0000 mg | ORAL_TABLET | Freq: Once | ORAL | Status: AC
  Administered 2021-09-10: 10 mg via ORAL
  Filled 2021-09-10: qty 1

## 2021-09-10 NOTE — Progress Notes (Signed)
Labs drawn yesterday.  ANC 1.3. Message received from Dr. Delton Coombes today okay to treat. Patient is D8 Velcade injection. Vital signs within parameters for today's treatment. Patient taking Revlimid as prescribed with no complaints of any side effects related to tx.  ? ?Velcade given today per MD orders. Tolerated without adverse affects. Vital signs stable. No complaints at this time. Discharged from clinic by wheel chair in stable condition. Alert and oriented x 3. F/U with [facility] as scheduled.   ?

## 2021-09-10 NOTE — Patient Instructions (Signed)
[facility]  Discharge Instructions: ?Thank you for choosing [facility] to provide your oncology and hematology care.  ?If you have a lab appointment with the [facility], please come in thru the Main Entrance and check in at the main information desk. ? ?Wear comfortable clothing and clothing appropriate for easy access to any Portacath or PICC line.  ? ?We strive to give you quality time with your provider. You may need to reschedule your appointment if you arrive late (15 or more minutes).  Arriving late affects you and other patients whose appointments are after yours.  Also, if you miss three or more appointments without notifying the office, you may be dismissed from the clinic at the provider?s discretion.    ?  ?For prescription refill requests, have your pharmacy contact our office and allow 72 hours for refills to be completed.   ? ?Today you received the following chemotherapy and/or immunotherapy agents Velcade injection.    ?  ?To help prevent nausea and vomiting after your treatment, we encourage you to take your nausea medication as directed. ? ?BELOW ARE SYMPTOMS THAT SHOULD BE REPORTED IMMEDIATELY: ?*FEVER GREATER THAN 100.4 F (38 ?C) OR HIGHER ?*CHILLS OR SWEATING ?*NAUSEA AND VOMITING THAT IS NOT CONTROLLED WITH YOUR NAUSEA MEDICATION ?*UNUSUAL SHORTNESS OF BREATH ?*UNUSUAL BRUISING OR BLEEDING ?*URINARY PROBLEMS (pain or burning when urinating, or frequent urination) ?*BOWEL PROBLEMS (unusual diarrhea, constipation, pain near the anus) ?TENDERNESS IN MOUTH AND THROAT WITH OR WITHOUT PRESENCE OF ULCERS (sore throat, sores in mouth, or a toothache) ?UNUSUAL RASH, SWELLING OR PAIN  ?UNUSUAL VAGINAL DISCHARGE OR ITCHING  ? ?Items with * indicate a potential emergency and should be followed up as soon as possible or go to the Emergency Department if any problems should occur. ? ?Please show the CHEMOTHERAPY ALERT CARD or IMMUNOTHERAPY ALERT CARD at check-in to the  Emergency Department and triage nurse. ? ?Should you have questions after your visit or need to cancel or reschedule your appointment, please contact [facility] [phone]  and follow the prompts.  Office hours are 8:00 a.m. to 4:30 p.m. Monday - Friday. Please note that voicemails left after 4:00 p.m. may not be returned until the following business day.  We are closed weekends and major holidays. You have access to a nurse at all times for urgent questions. Please call the main number to the clinic [phone] and follow the prompts. ? ?For any non-urgent questions, you may also contact your provider using MyChart. We now offer e-Visits for anyone 26 and older to request care online for non-urgent symptoms. For details visit [email or web address]. ?  ?Also download the MyChart app! Go to the app store, search "MyChart", open the app, select [facility], and log in with your MyChart username and password. ? ?Due to Covid, a mask is required upon entering the hospital/clinic. If you do not have a mask, one will be given to you upon arrival. For doctor visits, patients may have 1 support person aged 72 or older with them. For treatment visits, patients cannot have anyone with them due to current Covid guidelines and our immunocompromised population.  ?

## 2021-09-14 ENCOUNTER — Encounter (HOSPITAL_COMMUNITY): Payer: Self-pay

## 2021-09-14 ENCOUNTER — Inpatient Hospital Stay (HOSPITAL_COMMUNITY): Payer: Medicare Other

## 2021-09-14 VITALS — BP 110/67 | HR 88 | Temp 98.1°F | Resp 18

## 2021-09-14 DIAGNOSIS — Z5112 Encounter for antineoplastic immunotherapy: Secondary | ICD-10-CM | POA: Diagnosis not present

## 2021-09-14 DIAGNOSIS — C9000 Multiple myeloma not having achieved remission: Secondary | ICD-10-CM

## 2021-09-14 LAB — CBC WITH DIFFERENTIAL/PLATELET
Abs Immature Granulocytes: 0.04 10*3/uL (ref 0.00–0.07)
Basophils Absolute: 0.1 10*3/uL (ref 0.0–0.1)
Basophils Relative: 2 %
Eosinophils Absolute: 0.4 10*3/uL (ref 0.0–0.5)
Eosinophils Relative: 10 %
HCT: 31.5 % — ABNORMAL LOW (ref 36.0–46.0)
Hemoglobin: 10.3 g/dL — ABNORMAL LOW (ref 12.0–15.0)
Immature Granulocytes: 1 %
Lymphocytes Relative: 16 %
Lymphs Abs: 0.7 10*3/uL (ref 0.7–4.0)
MCH: 32.8 pg (ref 26.0–34.0)
MCHC: 32.7 g/dL (ref 30.0–36.0)
MCV: 100.3 fL — ABNORMAL HIGH (ref 80.0–100.0)
Monocytes Absolute: 0.3 10*3/uL (ref 0.1–1.0)
Monocytes Relative: 8 %
Neutro Abs: 2.7 10*3/uL (ref 1.7–7.7)
Neutrophils Relative %: 63 %
Platelets: 103 10*3/uL — ABNORMAL LOW (ref 150–400)
RBC: 3.14 MIL/uL — ABNORMAL LOW (ref 3.87–5.11)
RDW: 16.2 % — ABNORMAL HIGH (ref 11.5–15.5)
WBC: 4.3 10*3/uL (ref 4.0–10.5)
nRBC: 0.5 % — ABNORMAL HIGH (ref 0.0–0.2)

## 2021-09-14 LAB — COMPREHENSIVE METABOLIC PANEL
ALT: 15 U/L (ref 0–44)
AST: 16 U/L (ref 15–41)
Albumin: 3.8 g/dL (ref 3.5–5.0)
Alkaline Phosphatase: 98 U/L (ref 38–126)
Anion gap: 6 (ref 5–15)
BUN: 15 mg/dL (ref 8–23)
CO2: 28 mmol/L (ref 22–32)
Calcium: 7.4 mg/dL — ABNORMAL LOW (ref 8.9–10.3)
Chloride: 104 mmol/L (ref 98–111)
Creatinine, Ser: 0.83 mg/dL (ref 0.44–1.00)
GFR, Estimated: >60 mL/min (ref >60)
Glucose, Bld: 63 mg/dL — ABNORMAL LOW (ref 70–99)
Potassium: 3.6 mmol/L (ref 3.5–5.1)
Sodium: 138 mmol/L (ref 135–145)
Total Bilirubin: 0.6 mg/dL (ref 0.3–1.2)
Total Protein: 6.8 g/dL (ref 6.5–8.1)

## 2021-09-14 LAB — MAGNESIUM: Magnesium: 2.1 mg/dL (ref 1.7–2.4)

## 2021-09-14 MED ORDER — ACETAMINOPHEN 325 MG PO TABS
650.0000 mg | ORAL_TABLET | Freq: Once | ORAL | Status: AC
  Administered 2021-09-14: 650 mg via ORAL
  Filled 2021-09-14: qty 2

## 2021-09-14 MED ORDER — DEXAMETHASONE 4 MG PO TABS
20.0000 mg | ORAL_TABLET | Freq: Once | ORAL | Status: AC
  Administered 2021-09-14: 20 mg via ORAL
  Filled 2021-09-14: qty 5

## 2021-09-14 MED ORDER — DARATUMUMAB-HYALURONIDASE-FIHJ 1800-30000 MG-UT/15ML ~~LOC~~ SOLN
1800.0000 mg | Freq: Once | SUBCUTANEOUS | Status: AC
  Administered 2021-09-14: 1800 mg via SUBCUTANEOUS
  Filled 2021-09-14: qty 15

## 2021-09-14 MED ORDER — DIPHENHYDRAMINE HCL 25 MG PO CAPS
25.0000 mg | ORAL_CAPSULE | Freq: Once | ORAL | Status: AC
  Administered 2021-09-14: 25 mg via ORAL
  Filled 2021-09-14: qty 1

## 2021-09-14 NOTE — Progress Notes (Signed)
Patient presents today for Darzalex Faspro D15 C2. Vital signs and labs within parameters for treatment. Patient denies any nausea, diarrhea, or shortness of breath. Patient states she is taking her Revlimid as prescribed . Patient complains of a rash noted on the back of her head and her upper back. Message sent to Dr. Delton Coombes.  ? ?Message received from [place] proceed with treatment today.  ? ?Treatment given today per MD orders. Tolerated without adverse affects. Vital signs stable. No complaints at this time. Discharged from clinic ambulatory in stable condition. Alert and oriented x 3. F/U with [facility] as scheduled.   ?

## 2021-09-14 NOTE — Patient Instructions (Signed)
[facility]  Discharge Instructions: ?Thank you for choosing [facility] to provide your oncology and hematology care.  ?If you have a lab appointment with the [facility], please come in thru the Main Entrance and check in at the main information desk. ? ?Wear comfortable clothing and clothing appropriate for easy access to any Portacath or PICC line.  ? ?We strive to give you quality time with your provider. You may need to reschedule your appointment if you arrive late (15 or more minutes).  Arriving late affects you and other patients whose appointments are after yours.  Also, if you miss three or more appointments without notifying the office, you may be dismissed from the clinic at the provider?s discretion.    ?  ?For prescription refill requests, have your pharmacy contact our office and allow 72 hours for refills to be completed.   ? ?Today you received the following chemotherapy and/or immunotherapy agents Darzalex Faspro [place]    ?  ?To help prevent nausea and vomiting after your treatment, we encourage you to take your nausea medication as directed. ? ?BELOW ARE SYMPTOMS THAT SHOULD BE REPORTED IMMEDIATELY: ?*FEVER GREATER THAN 100.4 F (38 ?C) OR HIGHER ?*CHILLS OR SWEATING ?*NAUSEA AND VOMITING THAT IS NOT CONTROLLED WITH YOUR NAUSEA MEDICATION ?*UNUSUAL SHORTNESS OF BREATH ?*UNUSUAL BRUISING OR BLEEDING ?*URINARY PROBLEMS (pain or burning when urinating, or frequent urination) ?*BOWEL PROBLEMS (unusual diarrhea, constipation, pain near the anus) ?TENDERNESS IN MOUTH AND THROAT WITH OR WITHOUT PRESENCE OF ULCERS (sore throat, sores in mouth, or a toothache) ?UNUSUAL RASH, SWELLING OR PAIN  ?UNUSUAL VAGINAL DISCHARGE OR ITCHING  ? ?Items with * indicate a potential emergency and should be followed up as soon as possible or go to the Emergency Department if any problems should occur. ? ?Please show the CHEMOTHERAPY ALERT CARD or IMMUNOTHERAPY ALERT CARD at check-in to the  Emergency Department and triage nurse. ? ?Should you have questions after your visit or need to cancel or reschedule your appointment, please contact [facility] [phone]  and follow the prompts.  Office hours are 8:00 a.m. to 4:30 p.m. Monday - Friday. Please note that voicemails left after 4:00 p.m. may not be returned until the following business day.  We are closed weekends and major holidays. You have access to a nurse at all times for urgent questions. Please call the main number to the clinic [phone] and follow the prompts. ? ?For any non-urgent questions, you may also contact your provider using MyChart. We now offer e-Visits for anyone 23 and older to request care online for non-urgent symptoms. For details visit [email or web address]. ?  ?Also download the MyChart app! Go to the app store, search "MyChart", open the app, select [facility], and log in with your MyChart username and password. ? ?Due to Covid, a mask is required upon entering the hospital/clinic. If you do not have a mask, one will be given to you upon arrival. For doctor visits, patients may have 1 support person aged 62 or older with them. For treatment visits, patients cannot have anyone with them due to current Covid guidelines and our immunocompromised population.  ?

## 2021-09-16 ENCOUNTER — Encounter (HOSPITAL_COMMUNITY): Payer: Self-pay | Admitting: Hematology

## 2021-09-16 ENCOUNTER — Ambulatory Visit: Payer: Medicare Other | Admitting: Nurse Practitioner

## 2021-09-16 ENCOUNTER — Encounter: Payer: Self-pay | Admitting: Nurse Practitioner

## 2021-09-16 VITALS — BP 117/72 | HR 89 | Ht 63.0 in | Wt 181.0 lb

## 2021-09-16 DIAGNOSIS — W19XXXA Unspecified fall, initial encounter: Secondary | ICD-10-CM | POA: Diagnosis not present

## 2021-09-16 DIAGNOSIS — E782 Mixed hyperlipidemia: Secondary | ICD-10-CM

## 2021-09-16 DIAGNOSIS — M7918 Myalgia, other site: Secondary | ICD-10-CM

## 2021-09-16 DIAGNOSIS — F32A Depression, unspecified: Secondary | ICD-10-CM

## 2021-09-16 DIAGNOSIS — I10 Essential (primary) hypertension: Secondary | ICD-10-CM | POA: Diagnosis not present

## 2021-09-16 DIAGNOSIS — F419 Anxiety disorder, unspecified: Secondary | ICD-10-CM

## 2021-09-16 DIAGNOSIS — R32 Unspecified urinary incontinence: Secondary | ICD-10-CM

## 2021-09-16 NOTE — Assessment & Plan Note (Addendum)
Patient able to walk without assistance during this visit ?Need to prevent falls including risk of fracture and bleeding discussed with patient. ?Patient encouraged to use a rollator at home keep heart rolls free of clutters or anything that can get in her way to avoid falling ?Continue Tylenol 1000 mg 2 times daily as needed, Flexeril 5 mg twice daily as needed for her buttocks pain ? ?

## 2021-09-16 NOTE — Assessment & Plan Note (Signed)
States that she had urinary hesitancy for a week ran out of mybetriq ?She has restarted taking Myrbetriq '25mg'$  daily since  [place] voiding well without difficulty  ?

## 2021-09-16 NOTE — Assessment & Plan Note (Signed)
On simvastatin 40 mg daily ?Check lipid panel ?Lab Results  ?Component Value Date  ? CHOL 138 02/25/2021  ? HDL 55 02/25/2021  ? Jackson 69 02/25/2021  ? TRIG 71 02/25/2021  ? ?

## 2021-09-16 NOTE — Patient Instructions (Addendum)
Please continue tylenol and flexeril as needed for your buttock pain ? ?It is important that you exercise regularly at least 30 minutes 5 times a week.  ?Think about what you will eat, plan ahead. ?Choose " clean, green, fresh or frozen" over canned, processed or packaged foods which are more sugary, salty and fatty. ?70 to 75% of food eaten should be vegetables and fruit. ?Three meals at set times with snacks allowed between meals, but they must be fruit or vegetables. ?Aim to eat over a 12 hour period , example 7 am to 7 pm, and STOP after  your last meal of the day. ?Drink water,generally about 64 ounces per day, no other drink is as healthy. Fruit juice is best enjoyed in a healthy way, by EATING the fruit. ? ?Thanks for choosing [facility], we consider it a privelige to serve you. ? ?

## 2021-09-16 NOTE — Progress Notes (Signed)
? ?Courtney Rogers     MRN: [id number]      DOB: 1951-03-13 ? ? ?HPI ?[name] with past medical history of essential hypertension, IBS, fatty liver, multiple myeloma, anxiety and depression, urinary incontinence, hyperlipidemia is here for follow up and re-evaluation of chronic medical conditions, medication management and review of any available recent lab  ? ?The PT denies any adverse reactions to current medications since the last visit.  ? ?She was recently diagnosed with multiple myeloma cancer , currently seeing Dr Delton Coombes she has been taking chemotherapy twice weekly, taking chemo pills at home.  ? ? Pt c/o urinary hesitancy since the past one week, she had ran out of her mybetriq , she got the med refilled yesterday and she has been taking it, now she does not have trouble passing urine.  She denies bloody urine, pelvic pain, bladder fullness, fever, chills ? ?Pt c/o of fall 3 weeks ago, she was sitting on her Rolator while she slid off it and fell hitting her tail bone, described her pain as soreness 8/10. She has tried tylenol but its not helping.  Still has Flexeril which she has been taking as needed.  ?  ?Pt told to bring all her medications with her to her next follow up appointment. ? ? ?ROS ?Denies recent fever or chills. ?Denies sinus pressure, nasal congestion, ear pain or sore throat. ?Denies chest congestion, productive cough or wheezing. ?Denies chest pains, palpitations and leg swelling ?Denies abdominal pain, nausea, vomiting,diarrhea or constipation.   ?Denies dysuria, frequency, hesitancy or incontinence. ?Denies joint swelling and limitation in mobility. ?Denies headaches, seizures, numbness, or tingling. ? ? ? ? ?PE ? ?BP 117/72 (BP Location: Right Arm, Patient Position: Sitting, Cuff Size: Large)   Pulse 89   Ht _0  (1.6 m)   Wt 181 lb (82.1 kg)   SpO2 99%   BMI 32.06 kg/m?  ? ?Patient alert and oriented and in no cardiopulmonary distress. ? ?Chest: Clear to auscultation  bilaterally. ? ?CVS: S1, S2 no murmurs, no S3.Regular rate. ? ?ABD: Soft non tender.  ? ?MS: Adequate ROM spine, shoulders, hips and knees tenderness on palpation of right side of the sacrum ? ?Psych: Good eye contact, normal affect. Memory intact not anxious or depressed appearing. ? ?CNS: CN 2-12 intact, power,  normal throughout.no focal deficits noted. ? ?Assessment & Plan ? ?Anxiety and depression ?currently taking on sertraline 252m daily. She has stopped taking remeron and and buspar.  ?She has been seeing the therapist , therapist told her to take a little time off and see how she does without therapy ?States that therapy really helped.  ?I told patient that okay to stay away from Remeron and BuSpar at this time and continue sertraline 200 mg daily. ?PHQ9 score 0  ? ?Essential hypertension ?BP Readings from Last 3 Encounters:  ?09/16/21 117/72  ?09/14/21 110/67  ?09/10/21 112/70  ?Chronic condition well-controlled on chlorthalidone 25 mg daily, losartan 100 mg daily, amlodipine 10 mg daily, metoprolol 25 mg twice daily. ?Continue current medications ?DASH diet advised exercise daily as tolerated. ?Kidney functions and electrolytes are stable. ? ?Mixed hyperlipidemia ?On simvastatin 40 mg daily ?Check lipid panel ?Lab Results  ?Component Value Date  ? CHOL 138 02/25/2021  ? HDL 55 02/25/2021  ? LJackson69 02/25/2021  ? TRIG 71 02/25/2021  ? ? ?Fall ?Patient able to walk without assistance during this visit ?Need to prevent falls including risk of fracture and bleeding discussed with patient. ?Patient  encouraged to use a rollator at home keep heart rolls free of clutters or anything that can get in her way to avoid falling ?Continue Tylenol 1000 mg 2 times daily as needed, Flexeril 5 mg twice daily as needed for her buttocks pain ? ? ?Urinary incontinence ?States that she had urinary hesitancy for a week ran out of mybetriq ?She has restarted taking Myrbetriq 72m daily since  YAlbinvoiding well  without difficulty  ? ?Buttock pain ?Had a fall 3 weeks ago ?No concern for fracture at this time ?Continue Tylenol 1000 mg 2 times daily as needed, Flexeril 5 mg twice daily as needed for her buttocks pain ?Patient told to call the office if pain persists will do an x-ray to rule out fracture  ?

## 2021-09-16 NOTE — Assessment & Plan Note (Signed)
BP Readings from Last 3 Encounters:  ?09/16/21 117/72  ?09/14/21 110/67  ?09/10/21 112/70  ?Chronic condition well-controlled on chlorthalidone 25 mg daily, losartan 100 mg daily, amlodipine 10 mg daily, metoprolol 25 mg twice daily. ?Continue current medications ?DASH diet advised exercise daily as tolerated. ?Kidney functions and electrolytes are stable. ?

## 2021-09-16 NOTE — Assessment & Plan Note (Addendum)
currently taking on sertraline '200mg'$  daily. She has stopped taking remeron and and buspar.  ?She has been seeing the therapist , therapist told her to take a little time off and see how she does without therapy ?States that therapy really helped.  ?I told patient that okay to stay away from Remeron and BuSpar at this time and continue sertraline 200 mg daily. ?PHQ9 score 0  ?

## 2021-09-16 NOTE — Assessment & Plan Note (Signed)
Had a fall 3 weeks ago ?No concern for fracture at this time ?Continue Tylenol 1000 mg 2 times daily as needed, Flexeril 5 mg twice daily as needed for her buttocks pain ?Patient told to call the office if pain persists will do an x-ray to rule out fracture ?

## 2021-09-19 DIAGNOSIS — I252 Old myocardial infarction: Secondary | ICD-10-CM | POA: Insufficient documentation

## 2021-09-20 ENCOUNTER — Other Ambulatory Visit: Payer: Self-pay

## 2021-09-20 ENCOUNTER — Emergency Department (HOSPITAL_COMMUNITY): Payer: Medicare Other

## 2021-09-20 ENCOUNTER — Emergency Department (HOSPITAL_COMMUNITY)
Admission: EM | Admit: 2021-09-20 | Discharge: 2021-09-20 | Disposition: A | Discharge status: Home / Self Care | Payer: Medicare Other | Attending: Emergency Medicine | Admitting: Emergency Medicine

## 2021-09-20 ENCOUNTER — Encounter (HOSPITAL_COMMUNITY): Payer: Self-pay | Admitting: Emergency Medicine

## 2021-09-20 DIAGNOSIS — I119 Hypertensive heart disease without heart failure: Secondary | ICD-10-CM | POA: Insufficient documentation

## 2021-09-20 DIAGNOSIS — R072 Precordial pain: Secondary | ICD-10-CM | POA: Insufficient documentation

## 2021-09-20 DIAGNOSIS — I2510 Atherosclerotic heart disease of native coronary artery without angina pectoris: Secondary | ICD-10-CM | POA: Diagnosis not present

## 2021-09-20 DIAGNOSIS — R791 Abnormal coagulation profile: Secondary | ICD-10-CM | POA: Diagnosis not present

## 2021-09-20 DIAGNOSIS — Z7982 Long term (current) use of aspirin: Secondary | ICD-10-CM | POA: Insufficient documentation

## 2021-09-20 DIAGNOSIS — Z79899 Other long term (current) drug therapy: Secondary | ICD-10-CM | POA: Insufficient documentation

## 2021-09-20 DIAGNOSIS — Z951 Presence of aortocoronary bypass graft: Secondary | ICD-10-CM | POA: Diagnosis not present

## 2021-09-20 DIAGNOSIS — R079 Chest pain, unspecified: Secondary | ICD-10-CM

## 2021-09-20 DIAGNOSIS — D509 Iron deficiency anemia, unspecified: Secondary | ICD-10-CM | POA: Diagnosis not present

## 2021-09-20 HISTORY — DX: Malignant (primary) neoplasm, unspecified: C80.1

## 2021-09-20 LAB — CBC
HCT: 33.4 % — ABNORMAL LOW (ref 36.0–46.0)
Hemoglobin: 10.8 g/dL — ABNORMAL LOW (ref 12.0–15.0)
MCH: 32.1 pg (ref 26.0–34.0)
MCHC: 32.3 g/dL (ref 30.0–36.0)
MCV: 99.4 fL (ref 80.0–100.0)
Platelets: 145 10*3/uL — ABNORMAL LOW (ref 150–400)
RBC: 3.36 MIL/uL — ABNORMAL LOW (ref 3.87–5.11)
RDW: 15.8 % — ABNORMAL HIGH (ref 11.5–15.5)
WBC: 6.8 10*3/uL (ref 4.0–10.5)
nRBC: 0 % (ref 0.0–0.2)

## 2021-09-20 LAB — BASIC METABOLIC PANEL
Anion gap: 10 (ref 5–15)
BUN: 12 mg/dL (ref 8–23)
CO2: 27 mmol/L (ref 22–32)
Calcium: 7.2 mg/dL — ABNORMAL LOW (ref 8.9–10.3)
Chloride: 99 mmol/L (ref 98–111)
Creatinine, Ser: 0.75 mg/dL (ref 0.44–1.00)
GFR, Estimated: >60 mL/min (ref >60)
Glucose, Bld: 106 mg/dL — ABNORMAL HIGH (ref 70–99)
Potassium: 3.6 mmol/L (ref 3.5–5.1)
Sodium: 136 mmol/L (ref 135–145)

## 2021-09-20 LAB — PROTIME-INR
INR: 1.3 — ABNORMAL HIGH (ref 0.8–1.2)
Prothrombin Time: 16.5 seconds — ABNORMAL HIGH (ref 11.4–15.2)

## 2021-09-20 LAB — TROPONIN I (HIGH SENSITIVITY)
Troponin I (High Sensitivity): 7 ng/L (ref <18)
Troponin I (High Sensitivity): 7 ng/L (ref <18)

## 2021-09-20 MED ORDER — IOHEXOL 350 MG/ML SOLN
100.0000 mL | Freq: Once | INTRAVENOUS | Status: AC | PRN
  Administered 2021-09-20: 75 mL via INTRAVENOUS

## 2021-09-20 NOTE — Discharge Instructions (Signed)
Your lab tests, exam and work-up today are reassuring, we do not think that your symptoms are related to any worsening of your heart condition.  However we do recommend close follow-up with your cardiologist, call today for an office visit for recheck of your symptoms.  Your pacemaker was interrogated today and it is normal and without any recent events.  Get rechecked for any worsening symptoms however.  It is possible your symptoms are anxiety related as you suggest.  Keep your appointment with Dr. Delton Coombes tomorrow as planned. ?

## 2021-09-20 NOTE — ED Provider Notes (Signed)
?[facility] ?Provider Note ? ? ?CSN: [id number] ?Arrival date & time: 09/20/21  7035 ? ?  ? ?History ? ?Chief Complaint  ?Patient presents with  ? Chest Pain  ? ? ?Courtney Rogers is a 71 y.o. female with a history including hypertension, multiple myeloma, hyperlipidemia, history of CAD with reported acute MI in 2009, also has a permanent cardiac pacemaker, cardiologist in [place] presenting with a 24-hour history of constant midsternal chest pressure sensation.  She also feels the symptoms into her left shoulder, she states her symptoms are not reminiscent of her acute MI episode.  She was seen at an urgent care center yesterday where she was treated for a right eye stye, and when she returned home she developed her symptoms while she was at rest.  She denies shortness of breath but states she feels she has to take an extra effort in order to get a breath, but denies air hunger.  She also denies chest wall pain or pleuritic pain.  She has had no recent fevers or chills, denies cough, wheezing.  She also denies peripheral edema or extremity pain.  She is on daily Coumadin and aspirin, last doses were taken yesterday evening. ? ?The history is provided by the patient.  ? ?  ? ?Home Medications ?Prior to Admission medications   ?Medication Sig Start Date End Date Taking? Authorizing Provider  ?acetaminophen (TYLENOL) 500 MG tablet Take 1,000 mg by mouth 2 (two) times daily as needed for moderate pain or headache.   Yes [provider]  ?acyclovir (ZOVIRAX) 400 MG tablet Take 1 tablet (400 mg total) by mouth 2 (two) times daily. 08/09/21  Yes Derek Jack, MD  ?albuterol (PROVENTIL HFA;VENTOLIN HFA) 108 (90 BASE) MCG/ACT inhaler Inhale 2 puffs into the lungs every 6 (six) hours as needed for shortness of breath.   Yes [provider]  ?amLODipine (NORVASC) 10 MG tablet Take 1 tablet (10 mg total) by mouth daily. 07/15/20  Yes Noreene Larsson, NP  ?Ascorbic Acid 500  MG CHEW Chew by mouth.   Yes [provider]  ?aspirin EC 81 MG tablet Take 81 mg by mouth at bedtime.   Yes [provider]  ?B Complex Vitamins (VITAMIN B COMPLEX) TABS Take 1 tablet by mouth daily.   Yes [provider]  ?bortezomib SQ (VELCADE) 3.5 MG injection Inject into the skin once. 08/10/21  Yes [provider]  ?Calcium Carbonate-Vitamin D (CALCIUM 600+D PO) Take 1 tablet by mouth 2 (two) times daily.   Yes [provider]  ?chlorthalidone (HYGROTON) 25 MG tablet Take 25 mg by mouth daily.    Yes [provider]  ?cholecalciferol (VITAMIN D) 25 MCG (1000 UNIT) tablet Take 1,000 Units by mouth daily. 10/15/20  Yes [provider]  ?cyanocobalamin 1000 MCG tablet Take 1 tablet by mouth daily. 10/12/20  Yes [provider]  ?cyclobenzaprine (FLEXERIL) 10 MG tablet Take 0.5 tablets (5 mg total) by mouth 2 (two) times daily as needed for muscle spasms. 08/30/21  Yes Marcello Fennel, PA-C  ?Daratumumab-Hyaluronidase-fihj (DARZALEX FASPRO [place]) Inject into the skin. 08/10/21  Yes [provider]  ?fluticasone (FLONASE) 50 MCG/ACT nasal spray Place 2 sprays into both nostrils daily. 03/18/21  Yes Fayrene Helper, MD  ?fluticasone-salmeterol (ADVAIR) 250-50 MCG/ACT AEPB Inhale 1 puff into the lungs 2 (two) times daily. 10/12/20  Yes [provider]  ?folic acid (FOLVITE) 1 MG tablet TAKE 1 TABLET BY MOUTH ONCE DAILY. ?Patient  taking differently: Take 1 mg by mouth daily. 02/09/21  Yes Derek Jack, MD  ?gabapentin (NEURONTIN) 600 MG tablet Take 600 mg by mouth 2 (two) times daily. 05/07/19  Yes [provider]  ?lenalidomide (REVLIMID) 15 MG capsule Take 1 capsule (15 mg total) by mouth daily. 21 days on, 7 days off every 28 days 08/26/21  Yes Derek Jack, MD  ?lidocaine (XYLOCAINE) 2 % solution TAKE 2 TEASPOONFULS BEFORE MEALS AND AT BEDTIME AS NEEDED MAY REPEAT EVERY 4 HOURS. (MAX OF 8 DOSES PER DAY)  06/02/21  Yes Mahala Menghini, PA-C  ?Lidocaine HCl (XOLIDO) 2 % CREA Apply 1 application topically daily as needed (arthritisis).   Yes [provider]  ?losartan (COZAAR) 100 MG tablet Take 100 mg by mouth daily. 01/16/21  Yes [provider]  ?metoprolol tartrate (LOPRESSOR) 50 MG tablet Take 1 tablet (50 mg total) by mouth 2 (two) times daily. 06/18/21  Yes P[employer]a, Dewaine Conger, FNP  ?montelukast (SINGULAIR) 10 MG tablet Take 10 mg by mouth daily.    Yes [provider]  ?MYRBETRIQ 25 MG TB24 tablet Take 25 mg by mouth daily. 09/14/21  Yes [provider]  ?nitroGLYCERIN (NITROSTAT) 0.4 MG SL tablet Place 0.4 mg under the tongue every 5 (five) minutes as needed for chest pain.   Yes [provider]  ?pantoprazole (PROTONIX) 40 MG tablet Take 40 mg by mouth daily. 08/10/20  Yes [provider]  ?potassium chloride (K-DUR,KLOR-CON) 10 MEQ tablet Take 10 mEq by mouth daily.   Yes [provider]  ?primidone (MYSOLINE) 50 MG tablet Take 50 mg by mouth daily. 03/31/21  Yes [provider]  ?Probiotic Product (GNP PROBIOTIC COLON SUPPORT) CAPS Take 1 capsule by mouth daily. 07/08/21  Yes [provider]  ?prochlorperazine (COMPAZINE) 10 MG tablet Take 1 tablet (10 mg total) by mouth every 6 (six) hours as needed (Nausea or vomiting). 08/09/21  Yes Derek Jack, MD  ?sertraline (ZOLOFT) 100 MG tablet Take 200 mg by mouth daily.    Yes [provider]  ?Simethicone (GAS-X PO) Take 2 tablets by mouth daily as needed (gas).   Yes [provider]  ?warfarin (COUMADIN) 3 MG tablet Take 3 mg by mouth daily. 06/23/21  Yes [provider]  ?busPIRone (BUSPAR) 5 MG tablet Take 5 mg by mouth daily. ?Patient not taking: Reported on 09/16/2021 11/25/16   [provider]  ?simvastatin (ZOCOR) 40 MG tablet Take 40 mg by mouth at bedtime.    [provider]  ?   ? ?Allergies    ?Amoxicillin-pot clavulanate, Biaxin  [clarithromycin], Erythromycin, and Lisinopril   ? ?Review of Systems   ?Review of Systems  ?Constitutional:  Negative for chills, diaphoresis and fever.  ?HENT:  Negative for congestion and sore throat.   ?Eyes: Negative.   ?Respiratory:  Positive for chest tightness. Negative for shortness of breath.   ?Cardiovascular:  Positive for chest pain.  ?Gastrointestinal:  Negative for abdominal pain, nausea and vomiting.  ?Genitourinary: Negative.   ?Musculoskeletal:  Negative for arthralgias, joint swelling and neck pain.  ?Skin: Negative.  Negative for rash and wound.  ?Neurological:  Negative for dizziness, weakness, light-headedness, numbness and headaches.  ?Psychiatric/Behavioral: Negative.    ? ?Physical Exam ?Updated Vital Signs ?BP (!) 142/73   Pulse 83   Temp 98.1 ?F (36.7 ?C) (Oral)   Resp 11   Ht '5\' 3"'  (1.6 m)   Wt 82.1 kg   SpO2 99%   BMI 32.06  kg/m?  ?Physical Exam ?Vitals and nursing note reviewed.  ?Constitutional:   ?   Appearance: She is well-developed.  ?HENT:  ?   Head: Normocephalic and atraumatic.  ?Eyes:  ?   Conjunctiva/sclera: Conjunctivae normal.  ?Cardiovascular:  ?   Rate and Rhythm: Normal rate and regular rhythm.  ?   Heart sounds: Normal heart sounds.  ?Pulmonary:  ?   Effort: Pulmonary effort is normal.  ?   Breath sounds: Normal breath sounds. No wheezing, rhonchi or rales.  ?Abdominal:  ?   General: Bowel sounds are normal.  ?   Palpations: Abdomen is soft.  ?   Tenderness: There is no abdominal tenderness.  ?Musculoskeletal:     ?   General: Normal range of motion.  ?   Cervical back: Normal range of motion.  ?   Right lower leg: No tenderness. No edema.  ?   Left lower leg: No tenderness. No edema.  ?Skin: ?   General: Skin is warm and dry.  ?Neurological:  ?   General: No focal deficit present.  ?   Mental Status: She is alert.  ? ? ?ED Results / Procedures / Treatments   ?Labs ?(all labs ordered are listed, but only abnormal results are displayed) ?Labs Reviewed  ?BASIC  METABOLIC PANEL - Abnormal; Notable for the following components:  ?    Result Value  ? Glucose, Bld 106 (*)   ? Calcium 7.2 (*)   ? All other components within normal limits  ?CBC - Abnormal; Notable for the follo

## 2021-09-20 NOTE — ED Notes (Signed)
[employer] Pacemaker ?Implant date Nov.10/2013 ?Serial # [id number] --------- MODEL # [id number] ?SERIAL # [id number] --------MODEL # [id number] ?SERIAL # [id number] # [id number] ?

## 2021-09-20 NOTE — ED Triage Notes (Signed)
Pt to the ED with complaints of chest pain described as non radiating tightness that began last night. Pt has a history of an MI in 2009. ? ?

## 2021-09-21 ENCOUNTER — Inpatient Hospital Stay (HOSPITAL_COMMUNITY): Payer: Medicare Other

## 2021-09-21 ENCOUNTER — Other Ambulatory Visit (HOSPITAL_COMMUNITY): Payer: Self-pay

## 2021-09-21 ENCOUNTER — Inpatient Hospital Stay (HOSPITAL_COMMUNITY): Payer: Medicare Other | Attending: Hematology

## 2021-09-21 ENCOUNTER — Inpatient Hospital Stay (HOSPITAL_BASED_OUTPATIENT_CLINIC_OR_DEPARTMENT_OTHER): Payer: Medicare Other | Admitting: Hematology

## 2021-09-21 VITALS — BP 138/80 | HR 98 | Temp 98.1°F | Resp 18 | Ht 62.99 in | Wt 168.0 lb

## 2021-09-21 DIAGNOSIS — Z79899 Other long term (current) drug therapy: Secondary | ICD-10-CM | POA: Diagnosis not present

## 2021-09-21 DIAGNOSIS — C9000 Multiple myeloma not having achieved remission: Secondary | ICD-10-CM

## 2021-09-21 DIAGNOSIS — Z5112 Encounter for antineoplastic immunotherapy: Secondary | ICD-10-CM | POA: Diagnosis present

## 2021-09-21 DIAGNOSIS — E876 Hypokalemia: Secondary | ICD-10-CM

## 2021-09-21 LAB — CBC WITH DIFFERENTIAL/PLATELET
Abs Immature Granulocytes: 0.02 10*3/uL (ref 0.00–0.07)
Basophils Absolute: 0.1 10*3/uL (ref 0.0–0.1)
Basophils Relative: 1 %
Eosinophils Absolute: 0.1 10*3/uL (ref 0.0–0.5)
Eosinophils Relative: 1 %
HCT: 32.3 % — ABNORMAL LOW (ref 36.0–46.0)
Hemoglobin: 10.8 g/dL — ABNORMAL LOW (ref 12.0–15.0)
Immature Granulocytes: 0 %
Lymphocytes Relative: 7 %
Lymphs Abs: 0.5 10*3/uL — ABNORMAL LOW (ref 0.7–4.0)
MCH: 32.4 pg (ref 26.0–34.0)
MCHC: 33.4 g/dL (ref 30.0–36.0)
MCV: 97 fL (ref 80.0–100.0)
Monocytes Absolute: 1 10*3/uL (ref 0.1–1.0)
Monocytes Relative: 14 %
Neutro Abs: 5.4 10*3/uL (ref 1.7–7.7)
Neutrophils Relative %: 77 %
Platelets: 156 10*3/uL (ref 150–400)
RBC: 3.33 MIL/uL — ABNORMAL LOW (ref 3.87–5.11)
RDW: 15.5 % (ref 11.5–15.5)
WBC: 7 10*3/uL (ref 4.0–10.5)
nRBC: 0 % (ref 0.0–0.2)

## 2021-09-21 LAB — COMPREHENSIVE METABOLIC PANEL
ALT: 13 U/L (ref 0–44)
AST: 18 U/L (ref 15–41)
Albumin: 4.1 g/dL (ref 3.5–5.0)
Alkaline Phosphatase: 100 U/L (ref 38–126)
Anion gap: 11 (ref 5–15)
BUN: 15 mg/dL (ref 8–23)
CO2: 25 mmol/L (ref 22–32)
Calcium: 7.2 mg/dL — ABNORMAL LOW (ref 8.9–10.3)
Chloride: 98 mmol/L (ref 98–111)
Creatinine, Ser: 0.72 mg/dL (ref 0.44–1.00)
GFR, Estimated: >60 mL/min (ref >60)
Glucose, Bld: 110 mg/dL — ABNORMAL HIGH (ref 70–99)
Potassium: 3.2 mmol/L — ABNORMAL LOW (ref 3.5–5.1)
Sodium: 134 mmol/L — ABNORMAL LOW (ref 135–145)
Total Bilirubin: 0.8 mg/dL (ref 0.3–1.2)
Total Protein: 7 g/dL (ref 6.5–8.1)

## 2021-09-21 LAB — LACTATE DEHYDROGENASE: LDH: 178 U/L (ref 98–192)

## 2021-09-21 LAB — MAGNESIUM: Magnesium: 1.5 mg/dL — ABNORMAL LOW (ref 1.7–2.4)

## 2021-09-21 MED ORDER — BORTEZOMIB CHEMO SQ INJECTION 3.5 MG (2.5MG/ML)
1.3000 mg/m2 | Freq: Once | INTRAMUSCULAR | Status: AC
  Administered 2021-09-21: 2.25 mg via SUBCUTANEOUS
  Filled 2021-09-21: qty 0.9

## 2021-09-21 MED ORDER — POTASSIUM CHLORIDE CRYS ER 20 MEQ PO TBCR
40.0000 meq | EXTENDED_RELEASE_TABLET | Freq: Once | ORAL | Status: AC
  Administered 2021-09-21: 40 meq via ORAL
  Filled 2021-09-21: qty 2

## 2021-09-21 MED ORDER — DEXAMETHASONE 4 MG PO TABS
20.0000 mg | ORAL_TABLET | Freq: Once | ORAL | Status: AC
  Administered 2021-09-21: 20 mg via ORAL
  Filled 2021-09-21: qty 5

## 2021-09-21 MED ORDER — POTASSIUM CHLORIDE CRYS ER 10 MEQ PO TBCR: 10.0000 meq | EXTENDED_RELEASE_TABLET | Freq: Two times a day (BID) | ORAL | 6 refills | Status: DC

## 2021-09-21 MED ORDER — DIPHENHYDRAMINE HCL 25 MG PO CAPS
25.0000 mg | ORAL_CAPSULE | Freq: Once | ORAL | Status: AC
  Administered 2021-09-21: 25 mg via ORAL
  Filled 2021-09-21: qty 1

## 2021-09-21 MED ORDER — MAGNESIUM OXIDE -MG SUPPLEMENT 400 (240 MG) MG PO TABS: 400.0000 mg | ORAL_TABLET | Freq: Once | ORAL | Status: DC

## 2021-09-21 MED ORDER — POTASSIUM CHLORIDE CRYS ER 20 MEQ PO TBCR: 40.0000 meq | EXTENDED_RELEASE_TABLET | Freq: Once | ORAL | Status: DC

## 2021-09-21 MED ORDER — MAGNESIUM OXIDE -MG SUPPLEMENT 400 (240 MG) MG PO TABS: 400.0000 mg | ORAL_TABLET | Freq: Two times a day (BID) | ORAL | 4 refills | Status: DC

## 2021-09-21 MED ORDER — ACETAMINOPHEN 325 MG PO TABS
650.0000 mg | ORAL_TABLET | Freq: Once | ORAL | Status: AC
  Administered 2021-09-21: 650 mg via ORAL
  Filled 2021-09-21: qty 2

## 2021-09-21 MED ORDER — DARATUMUMAB-HYALURONIDASE-FIHJ 1800-30000 MG-UT/15ML ~~LOC~~ SOLN
1800.0000 mg | Freq: Once | SUBCUTANEOUS | Status: AC
  Administered 2021-09-21: 1800 mg via SUBCUTANEOUS
  Filled 2021-09-21: qty 15

## 2021-09-21 MED ORDER — MAGNESIUM OXIDE -MG SUPPLEMENT 400 (240 MG) MG PO TABS
400.0000 mg | ORAL_TABLET | Freq: Once | ORAL | Status: AC
  Administered 2021-09-21: 400 mg via ORAL
  Filled 2021-09-21: qty 1

## 2021-09-21 NOTE — Progress Notes (Signed)
? ?Courtney Rogers ?[address] ?[place], [place] [address] ? ? ?CLINIC:  ?Medical Oncology/Hematology ? ?PCP:  ?Renee Rival, FNP ?[address] / [place] [address] ?[phone] ? ? ?REASON FOR VISIT:  ?Follow-up for multiple myeloma ? ?PRIOR THERAPY: none ? ?NGS Results: not done ? ?CURRENT THERAPY: DaraVRd (Daratumumab SQ) q21d x 6 Cycles  ? ?BRIEF ONCOLOGIC HISTORY:  ?Oncology History  ?IgA myeloma ([place])  ?05/06/2021 Initial Diagnosis  ? IgA myeloma ([place]) ? ?  ?08/10/2021 -  Chemotherapy  ? Patient is on Treatment Plan : MYELOMA NEWLY DIAGNOSED TRANSPLANT CANDIDATE DaraVRd (Daratumumab SQ) q21d x 6 Cycles (Induction/Consolidation)  ? ?  ?  ? ? ?CANCER STAGING: ? Cancer Staging  ?No matching staging information was found for the patient. ? ?INTERVAL HISTORY:  ?[name], a 71 y.o. female, returns for routine follow-up and consideration for next cycle of chemotherapy. Erik was last seen on 08/31/2021. ? ?Due for cycle #3 of DaraVRd today.  ? ?Overall, she tells me she has been feeling pretty well. She denies current pain. She had chest tightness yesterday which she reports had since resolved. She reports she has diarrhea last week, but it has since resolved. She reports mild numbness in her fingertips, and she denies numbness in her feet. She reports she has been taking Revlimid 21 day on and 7 days off. She reports she is taking 1 Potassium tablet at night. She denies dropping objects.  ? ?Overall, she feels ready for next cycle of chemo today.  ? ?REVIEW OF SYSTEMS:  ?Review of Systems  ?Constitutional:  Negative for appetite change and fatigue.  ?Respiratory:  Positive for chest tightness.   ?Gastrointestinal:  Positive for abdominal pain (4/10). Negative for diarrhea (resolved).  ?Neurological:  Positive for numbness.  ?All other systems reviewed and are negative. ? ?PAST MEDICAL/SURGICAL HISTORY:  ?Past Medical History:  ?Diagnosis Date  ? Acute  myocardial infarction [facility]) 2009  ? CAD/no stent medically managed  ? Anxiety disorder   ? CAD (coronary artery disease)   ? Cancer [facility])   ? Hyperlipidemia   ? Hypertension   ? IBS (irritable bowel syndrome)   ? Non-ulcer dyspepsia 04/30/2009  ? Qualifier: Diagnosis of  By: Oneida Alar MD, Sandi L   ? Overactive bladder   ? PE (pulmonary embolism) 10/2012  ? Presence of permanent cardiac pacemaker   ? Sleep apnea   ? ?Past Surgical History:  ?Procedure Laterality Date  ? ABDOMINAL HYSTERECTOMY    ? BSO secondary to cyst    ? CARDIAC CATHETERIZATION    ? COLONOSCOPY  12/2009  ? Dr. West Carbo, propofol, normal. Next TCS 12/2019  ? COLONOSCOPY N/A 05/22/2017  ? Examined portion ileum was normal, significant looping of colon, external hemorrhoids and rectal bleeding due to internal hemorrhoids, mild diverticulosis procedure: COLONOSCOPY;  Surgeon: Danie Binder, MD;  [facility];  Service: Endoscopy;  Laterality: N/A;  10:30am  ? ESOPHAGOGASTRODUODENOSCOPY  05/11/2009  ? schatzki ring/small hiatal hernia/path:gastritis  ? ESOPHAGOGASTRODUODENOSCOPY N/A 05/22/2017  ? Multiple gastric polyps with biopsy benign fundic gland polyp, small bowel biopsy negative for celiac, gastric biopsy with mild gastritis but no H. pylori procedure: ESOPHAGOGASTRODUODENOSCOPY (EGD);  Surgeon: Danie Binder, MD;  Location: [facility];  Service: Endoscopy;  Laterality: N/A;  ? ESOPHAGOGASTRODUODENOSCOPY (EGD) WITH ESOPHAGEAL DILATION N/A 04/01/2013  ? [id number] at the gastroesophageal juction/multiple small polyps/mild gastritis  ? GIVENS CAPSULE STUDY N/A 06/07/2017  ? Frequent gastric  erosions, normal small bowel mucosa.  Procedure: GIVENS CAPSULE STUDY;  Surgeon: Danie Binder, MD;  Location: [facility];  Service: Endoscopy;  Laterality: N/A;  7:30am  ? INSERT / REPLACE / REMOVE PACEMAKER    ? Last year per pt.(can't remember date)  ? 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 ? ?SOCIAL HISTORY:  ?Social History   ? ?Socioeconomic History  ? Marital status: Married  ?  Spouse name: [name]  ? Number of children: 3  ? Years of education: 49  ? Highest education level: Not on file  ?Occupational History  ? Not on file  ?Tobacco Use  ? Smoking status: Never  ? Smokeless tobacco: Never  ? Tobacco comments:  ?  Never smoked   ?Vaping Use  ? Vaping Use: Never used  ?Substance and Sexual Activity  ? Alcohol use: Yes  ?  Alcohol/week: 0.0 standard drinks  ?  Comment: occ wine  ? Drug use: No  ? Sexual activity: Not Currently  ?Other Topics Concern  ? Not on file  ?Social History Narrative  ?   ? Lives with husband-51 years 71 in Aug 2021  ? Daughter is close by, 2 sons live further away  ? One IN ,ONE IN [place], ONE BESIDE HER.    ?   ? 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  ? Enjoys: reading, young adult  ?   ? Diet: eats all food groups   ? Caffeine: coffee 1, tea daily soda-1 daily  ? Water: 1-2 cups  ?   ? Wears seat belt   ? Does not use phone while driving  ? Smoke detectors at home   ? Data processing manager  ? Weapons -locked up  ?   ? Left handed  ? One story home  ? Drinks caffeine  ? ?Social Determinants of Health  ? ?Financial Resource Strain: Low Risk   ? Difficulty of Paying Living Expenses: Not hard at all  ?Food Insecurity: No Food Insecurity  ? Worried About Charity fundraiser in the Last Year: Never true  ? Ran Out of Food in the Last Year: Never true  ?Transportation Needs: No Transportation Needs  ? Lack of Transportation (Medical): No  ? Lack of Transportation (Non-Medical): No  ?Physical Activity: Inactive  ? Days of Exercise per Week: 0 days  ? Minutes of Exercise per Session: 0 min  ?Stress: Stress Concern Present  ? Feeling of Stress : Rather much  ?Social Connections: Moderately Integrated  ? Frequency of Communication with Friends and Family: Once a week  ? Frequency of Social Gatherings with Friends and Family: Once a week  ? Attends Religious Services: More than 4 times per year  ? Active  Member of Clubs or Organizations: Yes  ? Attends Archivist Meetings: More than 4 times per year  ? Marital Status: Married  ?Intimate Partner Violence: Not At Risk  ? Fear of Current or Ex-Partner: No  ? Emotionally Abused: No  ? Physically Abused: No  ? Sexually Abused: No  ? ? ?FAMILY HISTORY:  ?Family History  ?Problem Relation Age of Onset  ? Colon polyps Neg Hx   ? Colon cancer Neg Hx   ? ? ?CURRENT MEDICATIONS:  ?Current Outpatient Medications  ?Medication Sig Dispense Refill  ? acetaminophen (TYLENOL) 500 MG tablet Take 1,000 mg by mouth 2 (two) times daily as needed for moderate pain or headache.    ? acyclovir (ZOVIRAX) 400 MG tablet Take 1  tablet (400 mg total) by mouth 2 (two) times daily. 60 tablet 6  ? albuterol (PROVENTIL HFA;VENTOLIN HFA) 108 (90 BASE) MCG/ACT inhaler Inhale 2 puffs into the lungs every 6 (six) hours as needed for shortness of breath.    ? amLODipine (NORVASC) 10 MG tablet Take 1 tablet (10 mg total) by mouth daily. 90 tablet 1  ? amoxicillin-clavulanate (AUGMENTIN) 875-125 MG tablet Take 1 tablet by mouth 2 (two) times daily.    ? Ascorbic Acid 500 MG CHEW Chew by mouth.    ? aspirin EC 81 MG tablet Take 81 mg by mouth at bedtime.    ? B Complex Vitamins (VITAMIN B COMPLEX) TABS Take 1 tablet by mouth daily.    ? bortezomib SQ (VELCADE) 3.5 MG injection Inject into the skin once.    ? busPIRone (BUSPAR) 5 MG tablet Take 5 mg by mouth daily.    ? Calcium Carbonate-Vitamin D (CALCIUM 600+D PO) Take 1 tablet by mouth 2 (two) times daily.    ? cefdinir (OMNICEF) 300 MG capsule Take by mouth.    ? chlorthalidone (HYGROTON) 25 MG tablet Take 25 mg by mouth daily.     ? cholecalciferol (VITAMIN D) 25 MCG (1000 UNIT) tablet Take 1,000 Units by mouth daily.    ? cyanocobalamin 1000 MCG tablet Take 1 tablet by mouth daily.    ? cyclobenzaprine (FLEXERIL) 10 MG tablet Take 0.5 tablets (5 mg total) by mouth 2 (two) times daily as needed for muscle spasms. 20 tablet 0  ?  Daratumumab-Hyaluronidase-fihj (DARZALEX FASPRO [place]) Inject into the skin.    ? fluticasone (FLONASE) 50 MCG/ACT nasal spray Place 2 sprays into both nostrils daily. 16 g 6  ? fluticasone-salmeterol (ADVAIR) 250-50 MCG/ACT AEPB In

## 2021-09-21 NOTE — Progress Notes (Signed)
Patient has been examined by Dr. Katragadda, and vital signs and labs have been reviewed. ANC, Creatinine, LFTs, hemoglobin, and platelets are within treatment parameters per M.D. - pt may proceed with treatment.    °

## 2021-09-21 NOTE — Progress Notes (Signed)
Courtney Rogers presents today for Velcade and Daratumumab injections per the provider's orders.  Vitals signs and labs reviewed by MD. ? ?Message received from Anastasio Champion RN/Dr. Delton Coombes patient okay for treatment.  Patient also to receive 40 mEq of PO Potassium and 400 mg of Magnesium Oxide PO. ? ?Stable during administration without incident; injection site WNL; see MAR for injection details.  Patient tolerated procedure well and without incident.  No questions or complaints noted at this time. Discharge from clinic ambulatory in stable condition.  Alert and oriented X 3.  Follow up with [facility] as scheduled.  ? ? ?

## 2021-09-21 NOTE — Patient Instructions (Signed)
[facility]  Discharge Instructions: ?Thank you for choosing [facility] to provide your oncology and hematology care.  ?If you have a lab appointment with the [facility], please come in thru the Main Entrance and check in at the main information desk. ? ?Wear comfortable clothing and clothing appropriate for easy access to any Portacath or PICC line.  ? ?We strive to give you quality time with your provider. You may need to reschedule your appointment if you arrive late (15 or more minutes).  Arriving late affects you and other patients whose appointments are after yours.  Also, if you miss three or more appointments without notifying the office, you may be dismissed from the clinic at the provider?s discretion.    ?  ?For prescription refill requests, have your pharmacy contact our office and allow 72 hours for refills to be completed.   ? ?Today you received the following chemotherapy and/or immunotherapy agents Velcade and Daratumumab    ?  ?To help prevent nausea and vomiting after your treatment, we encourage you to take your nausea medication as directed. ? ?BELOW ARE SYMPTOMS THAT SHOULD BE REPORTED IMMEDIATELY: ?*FEVER GREATER THAN 100.4 F (38 ?C) OR HIGHER ?*CHILLS OR SWEATING ?*NAUSEA AND VOMITING THAT IS NOT CONTROLLED WITH YOUR NAUSEA MEDICATION ?*UNUSUAL SHORTNESS OF BREATH ?*UNUSUAL BRUISING OR BLEEDING ?*URINARY PROBLEMS (pain or burning when urinating, or frequent urination) ?*BOWEL PROBLEMS (unusual diarrhea, constipation, pain near the anus) ?TENDERNESS IN MOUTH AND THROAT WITH OR WITHOUT PRESENCE OF ULCERS (sore throat, sores in mouth, or a toothache) ?UNUSUAL RASH, SWELLING OR PAIN  ?UNUSUAL VAGINAL DISCHARGE OR ITCHING  ? ?Items with * indicate a potential emergency and should be followed up as soon as possible or go to the Emergency Department if any problems should occur. ? ?Please show the CHEMOTHERAPY ALERT CARD or IMMUNOTHERAPY ALERT CARD at check-in to the  Emergency Department and triage nurse. ? ?Should you have questions after your visit or need to cancel or reschedule your appointment, please contact [facility] [phone]  and follow the prompts.  Office hours are 8:00 a.m. to 4:30 p.m. Monday - Friday. Please note that voicemails left after 4:00 p.m. may not be returned until the following business day.  We are closed weekends and major holidays. You have access to a nurse at all times for urgent questions. Please call the main number to the clinic [phone] and follow the prompts. ? ?For any non-urgent questions, you may also contact your provider using MyChart. We now offer e-Visits for anyone 4 and older to request care online for non-urgent symptoms. For details visit [email or web address]. ?  ?Also download the MyChart app! Go to the app store, search "MyChart", open the app, select [facility], and log in with your MyChart username and password. ? ?Due to Covid, a mask is required upon entering the hospital/clinic. If you do not have a mask, one will be given to you upon arrival. For doctor visits, patients may have 1 support person aged 30 or older with them. For treatment visits, patients cannot have anyone with them due to current Covid guidelines and our immunocompromised population.  ?

## 2021-09-21 NOTE — Patient Instructions (Signed)
[facility] ?Discharge Instructions ? ? ?You were seen and examined today by Dr. Delton Coombes. ? ?He reviewed your lab work - your magnesium and potassium are low. Your myeloma numbers have improved significantly with treatment.  ? ?We will proceed with your treatment today. ? ?Return as scheduled.  ? ? ?Thank you for choosing [facility] to provide your oncology and hematology care.  To afford each patient quality time with our provider, please arrive at least 15 minutes before your scheduled appointment time.  ? ?If you have a lab appointment with the [facility] please come in thru the Main Entrance and check in at the main information desk. ? ?You need to re-schedule your appointment should you arrive 10 or more minutes late.  We strive to give you quality time with our providers, and arriving late affects you and other patients whose appointments are after yours.  Also, if you no show three or more times for appointments you may be dismissed from the clinic at the providers discretion.     ?Again, thank you for choosing [facility].  Our hope is that these requests will decrease the amount of time that you wait before being seen by our physicians.       ?_____________________________________________________________ ? ?Should you have questions after your visit to [facility] [address], please contact our office at [phone] and follow the prompts.  Our office hours are 8:00 a.m. and 4:30 p.m. Monday - Friday.  Please note that voicemails left after 4:00 p.m. may not be returned until the following business day.  We are closed weekends and major holidays.  You do have access to a nurse 24-7, just call the main number to the clinic [phone] and do not press any options, hold on the line and a nurse will answer the phone.   ? ?For prescription refill requests, have your pharmacy contact our office and allow 72 hours.    ? ?Due to Covid, you will need to wear a mask upon entering the hospital. If you do not have a mask, a mask will be given to you at the Main Entrance upon arrival. For doctor visits, patients may have 1 support person age 73 or older with them. For treatment visits, patients can not have anyone with them due to social distancing guidelines and our immunocompromised population.  ? ?   ?

## 2021-09-22 LAB — PROTEIN ELECTROPHORESIS, SERUM
A/G Ratio: 1.4 (ref 0.7–1.7)
Albumin ELP: 3.7 g/dL (ref 2.9–4.4)
Alpha-1-Globulin: 0.3 g/dL (ref 0.0–0.4)
Alpha-2-Globulin: 0.8 g/dL (ref 0.4–1.0)
Beta Globulin: 0.9 g/dL (ref 0.7–1.3)
Gamma Globulin: 0.6 g/dL (ref 0.4–1.8)
Globulin, Total: 2.7 g/dL (ref 2.2–3.9)
M-Spike, %: 0.3 g/dL — ABNORMAL HIGH
Total Protein ELP: 6.4 g/dL (ref 6.0–8.5)

## 2021-09-22 LAB — KAPPA/LAMBDA LIGHT CHAINS
Kappa free light chain: 15.6 mg/L (ref 3.3–19.4)
Kappa, lambda light chain ratio: 1.64 (ref 0.26–1.65)
Lambda free light chains: 9.5 mg/L (ref 5.7–26.3)

## 2021-09-23 ENCOUNTER — Telehealth: Payer: Self-pay

## 2021-09-23 ENCOUNTER — Other Ambulatory Visit: Payer: Self-pay | Admitting: Nurse Practitioner

## 2021-09-23 MED ORDER — BUSPIRONE HCL 5 MG PO TABS: 5.0000 mg | ORAL_TABLET | Freq: Every day | ORAL | 3 refills | Status: DC

## 2021-09-23 NOTE — Telephone Encounter (Signed)
Spoke with pt advised medication has been sent she verbalized understanding 

## 2021-09-23 NOTE — Telephone Encounter (Signed)
Please advise 

## 2021-09-23 NOTE — Telephone Encounter (Signed)
Patient called need anxiety med refill no longer get his medicine other provider.She has cancer treatment on Friday. ? ?busPIRone (BUSPAR) 5 MG tablet  ? ?Pharmacy: [facility] ?

## 2021-09-24 ENCOUNTER — Other Ambulatory Visit (HOSPITAL_COMMUNITY): Payer: Self-pay

## 2021-09-24 ENCOUNTER — Encounter (HOSPITAL_COMMUNITY): Payer: Self-pay | Admitting: Hematology

## 2021-09-24 ENCOUNTER — Telehealth: Payer: BC Managed Care – PPO | Admitting: Nurse Practitioner

## 2021-09-24 ENCOUNTER — Inpatient Hospital Stay (HOSPITAL_COMMUNITY): Payer: Medicare Other

## 2021-09-24 VITALS — BP 131/73 | HR 114 | Temp 98.5°F | Resp 18 | Wt 166.2 lb

## 2021-09-24 DIAGNOSIS — C9000 Multiple myeloma not having achieved remission: Secondary | ICD-10-CM

## 2021-09-24 DIAGNOSIS — Z5112 Encounter for antineoplastic immunotherapy: Secondary | ICD-10-CM | POA: Diagnosis not present

## 2021-09-24 MED ORDER — PROCHLORPERAZINE MALEATE 10 MG PO TABS
10.0000 mg | ORAL_TABLET | Freq: Once | ORAL | Status: AC
  Administered 2021-09-24: 10 mg via ORAL
  Filled 2021-09-24: qty 1

## 2021-09-24 MED ORDER — BORTEZOMIB CHEMO SQ INJECTION 3.5 MG (2.5MG/ML)
1.3000 mg/m2 | Freq: Once | INTRAMUSCULAR | Status: AC
  Administered 2021-09-24: 2.25 mg via SUBCUTANEOUS
  Filled 2021-09-24: qty 0.9

## 2021-09-24 MED ORDER — POTASSIUM CHLORIDE CRYS ER 20 MEQ PO TBCR
20.0000 meq | EXTENDED_RELEASE_TABLET | Freq: Once | ORAL | Status: AC
  Administered 2021-09-24: 20 meq via ORAL

## 2021-09-24 MED ORDER — MAGNESIUM SULFATE 2 GM/50ML IV SOLN
2.0000 g | Freq: Once | INTRAVENOUS | Status: AC
  Administered 2021-09-24: 2 g via INTRAVENOUS
  Filled 2021-09-24: qty 50

## 2021-09-24 MED ORDER — LENALIDOMIDE 15 MG PO CAPS: 15.0000 mg | ORAL_CAPSULE | Freq: Every day | ORAL | 0 refills | Status: DC

## 2021-09-24 MED ORDER — POTASSIUM CHLORIDE CRYS ER 20 MEQ PO TBCR
20.0000 meq | EXTENDED_RELEASE_TABLET | Freq: Once | ORAL | Status: AC
  Administered 2021-09-24: 20 meq via ORAL
  Filled 2021-09-24: qty 1

## 2021-09-24 NOTE — Telephone Encounter (Signed)
Chart reviewed. Revlimid refilled per last office note with Dr. Katragadda.  

## 2021-09-26 ENCOUNTER — Emergency Department (HOSPITAL_COMMUNITY): Payer: Medicare Other

## 2021-09-26 ENCOUNTER — Emergency Department (HOSPITAL_COMMUNITY)
Admission: EM | Admit: 2021-09-26 | Discharge: 2021-09-27 | Disposition: A | Discharge status: Home / Self Care | Payer: Medicare Other | Attending: Emergency Medicine | Admitting: Emergency Medicine

## 2021-09-26 ENCOUNTER — Encounter (HOSPITAL_COMMUNITY): Payer: Self-pay

## 2021-09-26 DIAGNOSIS — W01198A Fall on same level from slipping, tripping and stumbling with subsequent striking against other object, initial encounter: Secondary | ICD-10-CM | POA: Insufficient documentation

## 2021-09-26 DIAGNOSIS — R0789 Other chest pain: Secondary | ICD-10-CM | POA: Diagnosis not present

## 2021-09-26 DIAGNOSIS — C9000 Multiple myeloma not having achieved remission: Secondary | ICD-10-CM | POA: Insufficient documentation

## 2021-09-26 DIAGNOSIS — R197 Diarrhea, unspecified: Secondary | ICD-10-CM | POA: Insufficient documentation

## 2021-09-26 DIAGNOSIS — Z7982 Long term (current) use of aspirin: Secondary | ICD-10-CM | POA: Insufficient documentation

## 2021-09-26 DIAGNOSIS — R110 Nausea: Secondary | ICD-10-CM | POA: Diagnosis present

## 2021-09-26 DIAGNOSIS — D649 Anemia, unspecified: Secondary | ICD-10-CM

## 2021-09-26 DIAGNOSIS — D500 Iron deficiency anemia secondary to blood loss (chronic): Secondary | ICD-10-CM | POA: Insufficient documentation

## 2021-09-26 DIAGNOSIS — E876 Hypokalemia: Secondary | ICD-10-CM | POA: Diagnosis not present

## 2021-09-26 DIAGNOSIS — Z7901 Long term (current) use of anticoagulants: Secondary | ICD-10-CM | POA: Insufficient documentation

## 2021-09-26 DIAGNOSIS — D696 Thrombocytopenia, unspecified: Secondary | ICD-10-CM | POA: Diagnosis not present

## 2021-09-26 LAB — COMPREHENSIVE METABOLIC PANEL
ALT: 13 U/L (ref 0–44)
AST: 15 U/L (ref 15–41)
Albumin: 3.8 g/dL (ref 3.5–5.0)
Alkaline Phosphatase: 84 U/L (ref 38–126)
Anion gap: 12 (ref 5–15)
BUN: 16 mg/dL (ref 8–23)
CO2: 23 mmol/L (ref 22–32)
Calcium: 6.6 mg/dL — ABNORMAL LOW (ref 8.9–10.3)
Chloride: 101 mmol/L (ref 98–111)
Creatinine, Ser: 0.79 mg/dL (ref 0.44–1.00)
GFR, Estimated: >60 mL/min (ref >60)
Glucose, Bld: 90 mg/dL (ref 70–99)
Potassium: 2.6 mmol/L — CL (ref 3.5–5.1)
Sodium: 136 mmol/L (ref 135–145)
Total Bilirubin: 0.8 mg/dL (ref 0.3–1.2)
Total Protein: 6.8 g/dL (ref 6.5–8.1)

## 2021-09-26 LAB — CBC WITH DIFFERENTIAL/PLATELET
Abs Immature Granulocytes: 0.01 10*3/uL (ref 0.00–0.07)
Basophils Absolute: 0.1 10*3/uL (ref 0.0–0.1)
Basophils Relative: 1 %
Eosinophils Absolute: 0.3 10*3/uL (ref 0.0–0.5)
Eosinophils Relative: 5 %
HCT: 31.3 % — ABNORMAL LOW (ref 36.0–46.0)
Hemoglobin: 10.6 g/dL — ABNORMAL LOW (ref 12.0–15.0)
Immature Granulocytes: 0 %
Lymphocytes Relative: 10 %
Lymphs Abs: 0.5 10*3/uL — ABNORMAL LOW (ref 0.7–4.0)
MCH: 32.8 pg (ref 26.0–34.0)
MCHC: 33.9 g/dL (ref 30.0–36.0)
MCV: 96.9 fL (ref 80.0–100.0)
Monocytes Absolute: 0.9 10*3/uL (ref 0.1–1.0)
Monocytes Relative: 17 %
Neutro Abs: 3.4 10*3/uL (ref 1.7–7.7)
Neutrophils Relative %: 67 %
Platelets: 107 10*3/uL — ABNORMAL LOW (ref 150–400)
RBC: 3.23 MIL/uL — ABNORMAL LOW (ref 3.87–5.11)
RDW: 16 % — ABNORMAL HIGH (ref 11.5–15.5)
WBC: 5.1 10*3/uL (ref 4.0–10.5)
nRBC: 0 % (ref 0.0–0.2)

## 2021-09-26 LAB — MAGNESIUM: Magnesium: 2.1 mg/dL (ref 1.7–2.4)

## 2021-09-26 MED ORDER — LOPERAMIDE HCL 2 MG PO CAPS
4.0000 mg | ORAL_CAPSULE | Freq: Once | ORAL | Status: AC
  Administered 2021-09-26: 4 mg via ORAL
  Filled 2021-09-26: qty 2

## 2021-09-26 MED ORDER — SODIUM CHLORIDE 0.9 % IV BOLUS
500.0000 mL | Freq: Once | INTRAVENOUS | Status: AC
  Administered 2021-09-26: 500 mL via INTRAVENOUS

## 2021-09-26 MED ORDER — POTASSIUM CHLORIDE 10 MEQ/100ML IV SOLN
10.0000 meq | Freq: Once | INTRAVENOUS | Status: AC
  Administered 2021-09-26: 10 meq via INTRAVENOUS

## 2021-09-26 MED ORDER — POTASSIUM CHLORIDE 10 MEQ/100ML IV SOLN
10.0000 meq | Freq: Once | INTRAVENOUS | Status: AC
  Administered 2021-09-26: 10 meq via INTRAVENOUS
  Filled 2021-09-26: qty 100

## 2021-09-26 NOTE — ED Triage Notes (Signed)
Pt arrived via POV c/o N/V/D since Thursday this week following her cancer treatment. Pt ambulatory in Triage. ?

## 2021-09-26 NOTE — ED Provider Notes (Signed)
?[facility] ?Provider Note ? ? ?CSN: [id number] ?Arrival date & time: 09/26/21  1916 ? ?  ? ?History ? ?Chief Complaint  ?Patient presents with  ? Nausea  ? ? ?Courtney Rogers is a 71 y.o. female. ? ?HPI ? ?This patient is a 71 year old female, she has been diagnosed with multiple myeloma within the last couple of months and is currently taking daily oral chemotherapy and weekly injectable shots, last treated 48 hours ago.  She reports that since that time she has had some diarrhea, she was given a magnesium infusion, reports that she has had some burning sensation in the middle of her chest.  Review of the medical record shows that on May 1 approximately 6 days ago the patient was evaluated for chest pain and found to have no acute findings to suggest cardiac ischemia.  Her case was discussed with the on-call cardiologist, Dr. Harl Bowie and the decision was made to have the patient follow-up outpatient given the negative work-up. ? ?She reports that since receiving her last dose of chemotherapy on Friday she has had some diarrhea which is watery, multiple episodes, nonbloody, she is not nauseated or vomiting, she also had a fall yesterday and struck the right anterior chest on her kitchen table.  She has had some chest pain since that time she denies head injury, denies headache, denies vomiting or shortness of breath though she has been coughing for the last couple of days.  No fevers or chills ? ?Home Medications ?Prior to Admission medications   ?Medication Sig Start Date End Date Taking? Authorizing Provider  ?acetaminophen (TYLENOL) 500 MG tablet Take 1,000 mg by mouth 2 (two) times daily as needed for moderate pain or headache.    [provider]  ?acyclovir (ZOVIRAX) 400 MG tablet Take 1 tablet (400 mg total) by mouth 2 (two) times daily. 08/09/21   Derek Jack, MD  ?albuterol (PROVENTIL HFA;VENTOLIN HFA) 108 (90 BASE) MCG/ACT inhaler Inhale 2 puffs into the lungs every 6  (six) hours as needed for shortness of breath.    [provider]  ?amLODipine (NORVASC) 10 MG tablet Take 1 tablet (10 mg total) by mouth daily. 07/15/20   Noreene Larsson, NP  ?amoxicillin-clavulanate (AUGMENTIN) 875-125 MG tablet Take 1 tablet by mouth 2 (two) times daily. 09/20/21   [provider]  ?Ascorbic Acid 500 MG CHEW Chew by mouth.    [provider]  ?aspirin EC 81 MG tablet Take 81 mg by mouth at bedtime.    [provider]  ?B Complex Vitamins (VITAMIN B COMPLEX) TABS Take 1 tablet by mouth daily.    [provider]  ?bortezomib SQ (VELCADE) 3.5 MG injection Inject into the skin once. 08/10/21   [provider]  ?busPIRone (BUSPAR) 5 MG tablet Take 1 tablet (5 mg total) by mouth daily. 09/23/21   Renee Rival, FNP  ?Calcium Carbonate-Vitamin D (CALCIUM 600+D PO) Take 1 tablet by mouth 2 (two) times daily.    [provider]  ?cefdinir (OMNICEF) 300 MG capsule Take by mouth. 09/20/21   [provider]  ?chlorthalidone (HYGROTON) 25 MG tablet Take 25 mg by mouth daily.     [provider]  ?cholecalciferol (VITAMIN D) 25 MCG (1000 UNIT) tablet Take 1,000 Units by mouth daily. 10/15/20   [provider]  ?cyanocobalamin 1000 MCG tablet Take 1 tablet by mouth daily. 10/12/20   [provider]  ?cyclobenzaprine (FLEXERIL) 10 MG tablet Take 0.5 tablets (5  mg total) by mouth 2 (two) times daily as needed for muscle spasms. 08/30/21   Marcello Fennel, PA-C  ?Daratumumab-Hyaluronidase-fihj (DARZALEX FASPRO [place]) Inject into the skin. 08/10/21   [provider]  ?fluticasone (FLONASE) 50 MCG/ACT nasal spray Place 2 sprays into both nostrils daily. 03/18/21   Fayrene Helper, MD  ?fluticasone-salmeterol (ADVAIR) 250-50 MCG/ACT AEPB Inhale 1 puff into the lungs 2 (two) times daily. 10/12/20   [provider]  ?folic acid (FOLVITE) 1 MG tablet TAKE 1 TABLET BY MOUTH ONCE DAILY. ?Patient taking  differently: Take 1 mg by mouth daily. 02/09/21   Derek Jack, MD  ?gabapentin (NEURONTIN) 600 MG tablet Take 600 mg by mouth 2 (two) times daily. 05/07/19   [provider]  ?lenalidomide (REVLIMID) 15 MG capsule Take 1 capsule (15 mg total) by mouth daily. 21 days on, 7 days off every 28 days 09/24/21   Derek Jack, MD  ?lidocaine (XYLOCAINE) 2 % solution TAKE 2 TEASPOONFULS BEFORE MEALS AND AT BEDTIME AS NEEDED MAY REPEAT EVERY 4 HOURS. (MAX OF 8 DOSES PER DAY) 06/02/21   Mahala Menghini, PA-C  ?Lidocaine HCl (XOLIDO) 2 % CREA Apply 1 application topically daily as needed (arthritisis).    [provider]  ?losartan (COZAAR) 100 MG tablet Take 100 mg by mouth daily. 01/16/21   [provider]  ?magnesium oxide (MAG-OX) 400 (240 Mg) MG tablet Take 1 tablet (400 mg total) by mouth 2 (two) times daily. 09/21/21   Derek Jack, MD  ?metoprolol tartrate (LOPRESSOR) 50 MG tablet Take 1 tablet (50 mg total) by mouth 2 (two) times daily. 06/18/21   Renee Rival, FNP  ?montelukast (SINGULAIR) 10 MG tablet Take 10 mg by mouth daily.     [provider]  ?MYRBETRIQ 25 MG TB24 tablet Take 25 mg by mouth daily. 09/14/21   [provider]  ?nitroGLYCERIN (NITROSTAT) 0.4 MG SL tablet Place 0.4 mg under the tongue every 5 (five) minutes as needed for chest pain.    [provider]  ?pantoprazole (PROTONIX) 40 MG tablet Take 40 mg by mouth daily. 08/10/20   [provider]  ?potassium chloride (KLOR-CON M) 10 MEQ tablet Take 1 tablet (10 mEq total) by mouth 2 (two) times daily. 09/21/21   Derek Jack, MD  ?primidone (MYSOLINE) 50 MG tablet Take 50 mg by mouth daily. 03/31/21   [provider]  ?Probiotic Product (GNP PROBIOTIC COLON SUPPORT) CAPS Take 1 capsule by mouth daily. 07/08/21   [provider]  ?prochlorperazine (COMPAZINE) 10 MG tablet Take 1 tablet (10 mg total) by mouth every 6 (six) hours as needed (Nausea or  vomiting). 08/09/21   Derek Jack, MD  ?sertraline (ZOLOFT) 100 MG tablet Take 200 mg by mouth daily.     [provider]  ?Simethicone (GAS-X PO) Take 2 tablets by mouth daily as needed (gas).    [provider]  ?simvastatin (ZOCOR) 40 MG tablet Take 40 mg by mouth at bedtime.    [provider]  ?trimethoprim-polymyxin b (POLYTRIM) ophthalmic solution SMARTSIG:In Eye(s) 09/20/21   [provider]  ?warfarin (COUMADIN) 3 MG tablet Take 3 mg by mouth daily. 06/23/21   [provider]  ?   ? ?Allergies    ?Amoxicillin-pot clavulanate, Biaxin [clarithromycin], Erythromycin, and Lisinopril   ? ?Review of Systems   ?Review of Systems  ?All other systems reviewed and are negative. ? ?Physical Exam ?Updated Vital Signs ?BP (!) 146/50   Pulse (!) 107  Temp 97.9 ?F (36.6 ?C) (Oral)   Resp 18   Ht 1.6 m (5' 2.99")   Wt 75.4 kg   SpO2 98%   BMI 29.46 kg/m?  ?Physical Exam ?Vitals and nursing note reviewed.  ?Constitutional:   ?   General: She is not in acute distress. ?   Appearance: She is well-developed.  ?HENT:  ?   Head: Normocephalic and atraumatic.  ?   Mouth/Throat:  ?   Pharynx: No oropharyngeal exudate.  ?Eyes:  ?   General: No scleral icterus.    ?   Right eye: No discharge.     ?   Left eye: No discharge.  ?   Conjunctiva/sclera: Conjunctivae normal.  ?   Pupils: Pupils are equal, round, and reactive to light.  ?Neck:  ?   Thyroid: No thyromegaly.  ?   Vascular: No JVD.  ?Cardiovascular:  ?   Rate and Rhythm: Normal rate and regular rhythm.  ?   Heart sounds: Normal heart sounds. No murmur heard. ?  No friction rub. No gallop.  ?Pulmonary:  ?   Effort: Pulmonary effort is normal. No respiratory distress.  ?   Breath sounds: Normal breath sounds. No wheezing or rales.  ?   Comments: There is tenderness over the right anterior chest wall but no bruising or subcutaneous emphysema ?Chest:  ?   Chest wall: Tenderness present.  ?Abdominal:  ?   General: Bowel  sounds are normal. There is no distension.  ?   Palpations: Abdomen is soft. There is no mass.  ?   Tenderness: There is no abdominal tenderness.  ?Musculoskeletal:     ?   General: No tenderness. Normal range of mot

## 2021-09-26 NOTE — Discharge Instructions (Addendum)
Take the new potassium prescription for the next week, then resume your old potassium prescription. ? ?You may take loperamide (Imodium A-D) as needed for diarrhea. ?

## 2021-09-26 NOTE — ED Provider Notes (Addendum)
Care assumed from Dr. [name] Heck, patient with diarrhea and hypokalemia getting potassium supplementation and will need a repeat potassium. ? ?Repeat potassium is only come up to 2.8.  She will be given additional oral and intravenous potassium. ? ?She is discharged home with prescription for potassium supplementation.  It is noted that she is taking potassium 10 mEq twice a day.  She is given a new prescription for potassium 20 mEq 3 times a day for the next 7 days, then resume her prior potassium supplement schedule.  Told to use loperamide if diarrhea recurs. ?  ?Delora Fuel, MD ?95/28/41 [phone] ? ?Patient initially stated that she did not feel she was ready to go home.  She was given an additional liter of lactated Ringer solution and potassium was checked and has come up to 3.4.  She was given a fluid challenge and is tolerating oral fluids.  Advised that there was no indication for hospitalization at this time. ?  ?Delora Fuel, MD ?05/24/70 [phone] ? ?

## 2021-09-26 NOTE — ED Notes (Signed)
Pt reports last normal BM was this past Tuesday ?

## 2021-09-27 ENCOUNTER — Telehealth (HOSPITAL_COMMUNITY): Payer: Self-pay

## 2021-09-27 DIAGNOSIS — R110 Nausea: Secondary | ICD-10-CM | POA: Diagnosis not present

## 2021-09-27 LAB — POTASSIUM
Potassium: 2.8 mmol/L — ABNORMAL LOW (ref 3.5–5.1)
Potassium: 3.4 mmol/L — ABNORMAL LOW (ref 3.5–5.1)

## 2021-09-27 MED ORDER — POTASSIUM CHLORIDE CRYS ER 20 MEQ PO TBCR: 20.0000 meq | EXTENDED_RELEASE_TABLET | Freq: Three times a day (TID) | ORAL | 0 refills | Status: DC

## 2021-09-27 MED ORDER — POTASSIUM CHLORIDE 10 MEQ/100ML IV SOLN
10.0000 meq | INTRAVENOUS | Status: AC
  Administered 2021-09-27 (×2): 10 meq via INTRAVENOUS
  Filled 2021-09-27 (×2): qty 100

## 2021-09-27 MED ORDER — POTASSIUM CHLORIDE CRYS ER 20 MEQ PO TBCR
40.0000 meq | EXTENDED_RELEASE_TABLET | Freq: Once | ORAL | Status: AC
  Administered 2021-09-27: 40 meq via ORAL
  Filled 2021-09-27: qty 2

## 2021-09-27 MED ORDER — LACTATED RINGERS IV BOLUS
1000.0000 mL | Freq: Once | INTRAVENOUS | Status: AC
  Administered 2021-09-27: 1000 mL via INTRAVENOUS

## 2021-09-27 NOTE — Telephone Encounter (Signed)
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 Patient has a follow-up appointment with Dr. Delton Coombes on 10/19/2021, Dr. Delton Coombes made aware. ?

## 2021-09-28 ENCOUNTER — Inpatient Hospital Stay (HOSPITAL_COMMUNITY): Payer: Medicare Other

## 2021-09-28 VITALS — BP 122/78 | HR 100 | Temp 96.9°F | Resp 20 | Ht 64.17 in | Wt 168.1 lb

## 2021-09-28 DIAGNOSIS — C9000 Multiple myeloma not having achieved remission: Secondary | ICD-10-CM

## 2021-09-28 DIAGNOSIS — Z5112 Encounter for antineoplastic immunotherapy: Secondary | ICD-10-CM | POA: Diagnosis not present

## 2021-09-28 LAB — MAGNESIUM: Magnesium: 1.5 mg/dL — ABNORMAL LOW (ref 1.7–2.4)

## 2021-09-28 LAB — CBC WITH DIFFERENTIAL/PLATELET
Abs Immature Granulocytes: 0.02 10*3/uL (ref 0.00–0.07)
Basophils Absolute: 0.1 10*3/uL (ref 0.0–0.1)
Basophils Relative: 1 %
Eosinophils Absolute: 0.4 10*3/uL (ref 0.0–0.5)
Eosinophils Relative: 8 %
HCT: 31.1 % — ABNORMAL LOW (ref 36.0–46.0)
Hemoglobin: 10.3 g/dL — ABNORMAL LOW (ref 12.0–15.0)
Immature Granulocytes: 0 %
Lymphocytes Relative: 10 %
Lymphs Abs: 0.5 10*3/uL — ABNORMAL LOW (ref 0.7–4.0)
MCH: 32.8 pg (ref 26.0–34.0)
MCHC: 33.1 g/dL (ref 30.0–36.0)
MCV: 99 fL (ref 80.0–100.0)
Monocytes Absolute: 0.8 10*3/uL (ref 0.1–1.0)
Monocytes Relative: 16 %
Neutro Abs: 3.3 10*3/uL (ref 1.7–7.7)
Neutrophils Relative %: 65 %
Platelets: 122 10*3/uL — ABNORMAL LOW (ref 150–400)
RBC: 3.14 MIL/uL — ABNORMAL LOW (ref 3.87–5.11)
RDW: 16.2 % — ABNORMAL HIGH (ref 11.5–15.5)
WBC: 5.1 10*3/uL (ref 4.0–10.5)
nRBC: 0 % (ref 0.0–0.2)

## 2021-09-28 LAB — COMPREHENSIVE METABOLIC PANEL
ALT: 14 U/L (ref 0–44)
AST: 19 U/L (ref 15–41)
Albumin: 3.8 g/dL (ref 3.5–5.0)
Alkaline Phosphatase: 85 U/L (ref 38–126)
Anion gap: 10 (ref 5–15)
BUN: 8 mg/dL (ref 8–23)
CO2: 26 mmol/L (ref 22–32)
Calcium: 7.1 mg/dL — ABNORMAL LOW (ref 8.9–10.3)
Chloride: 101 mmol/L (ref 98–111)
Creatinine, Ser: 0.74 mg/dL (ref 0.44–1.00)
GFR, Estimated: >60 mL/min (ref >60)
Glucose, Bld: 85 mg/dL (ref 70–99)
Potassium: 3.6 mmol/L (ref 3.5–5.1)
Sodium: 137 mmol/L (ref 135–145)
Total Bilirubin: 0.8 mg/dL (ref 0.3–1.2)
Total Protein: 6.8 g/dL (ref 6.5–8.1)

## 2021-09-28 MED ORDER — DIPHENHYDRAMINE HCL 25 MG PO CAPS
25.0000 mg | ORAL_CAPSULE | Freq: Once | ORAL | Status: AC
  Administered 2021-09-28: 25 mg via ORAL
  Filled 2021-09-28: qty 1

## 2021-09-28 MED ORDER — MAGNESIUM OXIDE -MG SUPPLEMENT 400 (240 MG) MG PO TABS
400.0000 mg | ORAL_TABLET | Freq: Once | ORAL | Status: AC
  Administered 2021-09-28: 400 mg via ORAL
  Filled 2021-09-28: qty 1

## 2021-09-28 MED ORDER — BORTEZOMIB CHEMO SQ INJECTION 3.5 MG (2.5MG/ML)
1.3000 mg/m2 | Freq: Once | INTRAMUSCULAR | Status: AC
  Administered 2021-09-28: 2.25 mg via SUBCUTANEOUS
  Filled 2021-09-28: qty 0.9

## 2021-09-28 MED ORDER — DEXAMETHASONE 4 MG PO TABS
20.0000 mg | ORAL_TABLET | Freq: Once | ORAL | Status: AC
  Administered 2021-09-28: 20 mg via ORAL
  Filled 2021-09-28: qty 5

## 2021-09-28 MED ORDER — ACETAMINOPHEN 325 MG PO TABS
650.0000 mg | ORAL_TABLET | Freq: Once | ORAL | Status: AC
  Administered 2021-09-28: 650 mg via ORAL
  Filled 2021-09-28: qty 2

## 2021-09-28 MED ORDER — DARATUMUMAB-HYALURONIDASE-FIHJ 1800-30000 MG-UT/15ML ~~LOC~~ SOLN
1800.0000 mg | Freq: Once | SUBCUTANEOUS | Status: AC
  Administered 2021-09-28: 1800 mg via SUBCUTANEOUS
  Filled 2021-09-28: qty 15

## 2021-09-28 NOTE — Progress Notes (Signed)
Courtney Rogers presents today for Daratumumab and Velcade injections per the provider's orders.  Stable during administration without incident; injection site WNL; see MAR for injection details.  Patient tolerated procedure well and without incident.  No questions or complaints noted at this time. Discharge from clinic ambulatory in stable condition.  Alert and oriented X 3.  Follow up with [facility] as scheduled.  ?

## 2021-09-28 NOTE — Patient Instructions (Signed)
[place]  Discharge Instructions: ?Thank you for choosing [facility] to provide your oncology and hematology care.  ?If you have a lab appointment with the [facility], please come in thru the Main Entrance and check in at the main information desk. ? ?Wear comfortable clothing and clothing appropriate for easy access to any Portacath or PICC line.  ? ?We strive to give you quality time with your provider. You may need to reschedule your appointment if you arrive late (15 or more minutes).  Arriving late affects you and other patients whose appointments are after yours.  Also, if you miss three or more appointments without notifying the office, you may be dismissed from the clinic at the provider?s discretion.    ?  ?For prescription refill requests, have your pharmacy contact our office and allow 72 hours for refills to be completed.   ? ?Today you received the following chemotherapy and/or immunotherapy agents Daratumumab Velcade    ?  ?To help prevent nausea and vomiting after your treatment, we encourage you to take your nausea medication as directed. ? ?BELOW ARE SYMPTOMS THAT SHOULD BE REPORTED IMMEDIATELY: ?*FEVER GREATER THAN 100.4 F (38 ?C) OR HIGHER ?*CHILLS OR SWEATING ?*NAUSEA AND VOMITING THAT IS NOT CONTROLLED WITH YOUR NAUSEA MEDICATION ?*UNUSUAL SHORTNESS OF BREATH ?*UNUSUAL BRUISING OR BLEEDING ?*URINARY PROBLEMS (pain or burning when urinating, or frequent urination) ?*BOWEL PROBLEMS (unusual diarrhea, constipation, pain near the anus) ?TENDERNESS IN MOUTH AND THROAT WITH OR WITHOUT PRESENCE OF ULCERS (sore throat, sores in mouth, or a toothache) ?UNUSUAL RASH, SWELLING OR PAIN  ?UNUSUAL VAGINAL DISCHARGE OR ITCHING  ? ?Items with * indicate a potential emergency and should be followed up as soon as possible or go to the Emergency Department if any problems should occur. ? ?Please show the CHEMOTHERAPY ALERT CARD or IMMUNOTHERAPY ALERT CARD at check-in to the  Emergency Department and triage nurse. ? ?Should you have questions after your visit or need to cancel or reschedule your appointment, please contact [facility] [phone]  and follow the prompts.  Office hours are 8:00 a.m. to 4:30 p.m. Monday - Friday. Please note that voicemails left after 4:00 p.m. may not be returned until the following business day.  We are closed weekends and major holidays. You have access to a nurse at all times for urgent questions. Please call the main number to the clinic [phone] and follow the prompts. ? ?For any non-urgent questions, you may also contact your provider using MyChart. We now offer e-Visits for anyone 74 and older to request care online for non-urgent symptoms. For details visit [email or web address]. ?  ?Also download the MyChart app! Go to the app store, search "MyChart", open the app, select [place], and log in with your MyChart username and password. ? ?Due to Covid, a mask is required upon entering the hospital/clinic. If you do not have a mask, one will be given to you upon arrival. For doctor visits, patients may have 1 support person aged 96 or older with them. For treatment visits, patients cannot have anyone with them due to current Covid guidelines and our immunocompromised population.  ?

## 2021-10-01 ENCOUNTER — Inpatient Hospital Stay (HOSPITAL_COMMUNITY): Payer: Medicare Other

## 2021-10-01 VITALS — BP 118/74 | HR 90 | Temp 96.0°F | Resp 18

## 2021-10-01 DIAGNOSIS — Z5112 Encounter for antineoplastic immunotherapy: Secondary | ICD-10-CM | POA: Diagnosis not present

## 2021-10-01 DIAGNOSIS — C9000 Multiple myeloma not having achieved remission: Secondary | ICD-10-CM

## 2021-10-01 MED ORDER — PROCHLORPERAZINE MALEATE 10 MG PO TABS
10.0000 mg | ORAL_TABLET | Freq: Once | ORAL | Status: AC
  Administered 2021-10-01: 10 mg via ORAL
  Filled 2021-10-01: qty 1

## 2021-10-01 MED ORDER — BORTEZOMIB CHEMO SQ INJECTION 3.5 MG (2.5MG/ML)
1.3000 mg/m2 | Freq: Once | INTRAMUSCULAR | Status: AC
  Administered 2021-10-01: 2.25 mg via SUBCUTANEOUS
  Filled 2021-10-01: qty 0.9

## 2021-10-01 NOTE — Progress Notes (Signed)
Patient presents today for treatment. Labs done on 09/28/2021 within parameters for treatment. Patient taking Revlimid as prescribed with no complaints of side effects noted. Patient currently taking Cholecalciferol ( Vitamin D) 25 mcg daily PO.  ? ?Message received to give Velcade today. Magnesium 1.5 and calcium 7.1 on labs from 09/28/2021. Patient instructed to take Magnesium and Calcium medication per provider orders. Patient states she stopped taking her Magnesium due it made her chest hurt per patient's words. Patient teaching performed pertaining to medication administration. Understanding verbalized.  ? ?Velcade injection given today per MD orders. Tolerated without adverse affects. Vital signs stable. No complaints at this time. Discharged from clinic by wheel chair in stable condition. Alert and oriented x 3. F/U with [facility] as scheduled.   ?

## 2021-10-01 NOTE — Patient Instructions (Signed)
[facility]  Discharge Instructions: ?Thank you for choosing [facility] to provide your oncology and hematology care.  ?If you have a lab appointment with the [facility], please come in thru the Main Entrance and check in at the main information desk. ? ?Wear comfortable clothing and clothing appropriate for easy access to any Portacath or PICC line.  ? ?We strive to give you quality time with your provider. You may need to reschedule your appointment if you arrive late (15 or more minutes).  Arriving late affects you and other patients whose appointments are after yours.  Also, if you miss three or more appointments without notifying the office, you may be dismissed from the clinic at the provider?s discretion.    ?  ?For prescription refill requests, have your pharmacy contact our office and allow 72 hours for refills to be completed.   ? ?Today you received the following chemotherapy and/or immunotherapy agents Velcade injection.     ?  ?To help prevent nausea and vomiting after your treatment, we encourage you to take your nausea medication as directed. ? ?BELOW ARE SYMPTOMS THAT SHOULD BE REPORTED IMMEDIATELY: ?*FEVER GREATER THAN 100.4 F (38 ?C) OR HIGHER ?*CHILLS OR SWEATING ?*NAUSEA AND VOMITING THAT IS NOT CONTROLLED WITH YOUR NAUSEA MEDICATION ?*UNUSUAL SHORTNESS OF BREATH ?*UNUSUAL BRUISING OR BLEEDING ?*URINARY PROBLEMS (pain or burning when urinating, or frequent urination) ?*BOWEL PROBLEMS (unusual diarrhea, constipation, pain near the anus) ?TENDERNESS IN MOUTH AND THROAT WITH OR WITHOUT PRESENCE OF ULCERS (sore throat, sores in mouth, or a toothache) ?UNUSUAL RASH, SWELLING OR PAIN  ?UNUSUAL VAGINAL DISCHARGE OR ITCHING  ? ?Items with * indicate a potential emergency and should be followed up as soon as possible or go to the Emergency Department if any problems should occur. ? ?Please show the CHEMOTHERAPY ALERT CARD or IMMUNOTHERAPY ALERT CARD at check-in to the  Emergency Department and triage nurse. ? ?Should you have questions after your visit or need to cancel or reschedule your appointment, please contact [facility] [phone]  and follow the prompts.  Office hours are 8:00 a.m. to 4:30 p.m. Monday - Friday. Please note that voicemails left after 4:00 p.m. may not be returned until the following business day.  We are closed weekends and major holidays. You have access to a nurse at all times for urgent questions. Please call the main number to the clinic [phone] and follow the prompts. ? ?For any non-urgent questions, you may also contact your provider using MyChart. We now offer e-Visits for anyone 17 and older to request care online for non-urgent symptoms. For details visit [email or web address]. ?  ?Also download the MyChart app! Go to the app store, search "MyChart", open the app, select [place], and log in with your MyChart username and password. ? ?Due to Covid, a mask is required upon entering the hospital/clinic. If you do not have a mask, one will be given to you upon arrival. For doctor visits, patients may have 1 support person aged 40 or older with them. For treatment visits, patients cannot have anyone with them due to current Covid guidelines and our immunocompromised population.  ? ? ? ?

## 2021-10-05 ENCOUNTER — Encounter (HOSPITAL_COMMUNITY): Payer: Self-pay

## 2021-10-05 ENCOUNTER — Inpatient Hospital Stay (HOSPITAL_COMMUNITY): Payer: Medicare Other

## 2021-10-05 VITALS — BP 106/68 | HR 90 | Temp 97.9°F | Resp 18 | Ht 64.17 in | Wt 163.8 lb

## 2021-10-05 DIAGNOSIS — C9000 Multiple myeloma not having achieved remission: Secondary | ICD-10-CM

## 2021-10-05 DIAGNOSIS — Z5112 Encounter for antineoplastic immunotherapy: Secondary | ICD-10-CM | POA: Diagnosis not present

## 2021-10-05 LAB — COMPREHENSIVE METABOLIC PANEL
ALT: 14 U/L (ref 0–44)
AST: 22 U/L (ref 15–41)
Albumin: 3.8 g/dL (ref 3.5–5.0)
Alkaline Phosphatase: 86 U/L (ref 38–126)
Anion gap: 7 (ref 5–15)
BUN: 13 mg/dL (ref 8–23)
CO2: 25 mmol/L (ref 22–32)
Calcium: 7.4 mg/dL — ABNORMAL LOW (ref 8.9–10.3)
Chloride: 103 mmol/L (ref 98–111)
Creatinine, Ser: 0.86 mg/dL (ref 0.44–1.00)
GFR, Estimated: >60 mL/min (ref >60)
Glucose, Bld: 64 mg/dL — ABNORMAL LOW (ref 70–99)
Potassium: 3.9 mmol/L (ref 3.5–5.1)
Sodium: 135 mmol/L (ref 135–145)
Total Bilirubin: 0.7 mg/dL (ref 0.3–1.2)
Total Protein: 6.3 g/dL — ABNORMAL LOW (ref 6.5–8.1)

## 2021-10-05 LAB — CBC WITH DIFFERENTIAL/PLATELET
Abs Immature Granulocytes: 0.02 10*3/uL (ref 0.00–0.07)
Basophils Absolute: 0 10*3/uL (ref 0.0–0.1)
Basophils Relative: 1 %
Eosinophils Absolute: 0.1 10*3/uL (ref 0.0–0.5)
Eosinophils Relative: 1 %
HCT: 32.1 % — ABNORMAL LOW (ref 36.0–46.0)
Hemoglobin: 10.2 g/dL — ABNORMAL LOW (ref 12.0–15.0)
Immature Granulocytes: 1 %
Lymphocytes Relative: 12 %
Lymphs Abs: 0.5 10*3/uL — ABNORMAL LOW (ref 0.7–4.0)
MCH: 31.7 pg (ref 26.0–34.0)
MCHC: 31.8 g/dL (ref 30.0–36.0)
MCV: 99.7 fL (ref 80.0–100.0)
Monocytes Absolute: 0.9 10*3/uL (ref 0.1–1.0)
Monocytes Relative: 20 %
Neutro Abs: 2.8 10*3/uL (ref 1.7–7.7)
Neutrophils Relative %: 65 %
Platelets: 121 10*3/uL — ABNORMAL LOW (ref 150–400)
RBC: 3.22 MIL/uL — ABNORMAL LOW (ref 3.87–5.11)
RDW: 15.9 % — ABNORMAL HIGH (ref 11.5–15.5)
WBC: 4.3 10*3/uL (ref 4.0–10.5)
nRBC: 0 % (ref 0.0–0.2)

## 2021-10-05 LAB — MAGNESIUM: Magnesium: 1.8 mg/dL (ref 1.7–2.4)

## 2021-10-05 MED ORDER — DEXAMETHASONE 4 MG PO TABS
20.0000 mg | ORAL_TABLET | Freq: Once | ORAL | Status: AC
  Administered 2021-10-05: 20 mg via ORAL
  Filled 2021-10-05: qty 5

## 2021-10-05 MED ORDER — DIPHENHYDRAMINE HCL 25 MG PO CAPS
25.0000 mg | ORAL_CAPSULE | Freq: Once | ORAL | Status: AC
  Administered 2021-10-05: 25 mg via ORAL
  Filled 2021-10-05: qty 1

## 2021-10-05 MED ORDER — ACETAMINOPHEN 325 MG PO TABS
650.0000 mg | ORAL_TABLET | Freq: Once | ORAL | Status: AC
  Administered 2021-10-05: 650 mg via ORAL
  Filled 2021-10-05: qty 2

## 2021-10-05 MED ORDER — DARATUMUMAB-HYALURONIDASE-FIHJ 1800-30000 MG-UT/15ML ~~LOC~~ SOLN
1800.0000 mg | Freq: Once | SUBCUTANEOUS | Status: AC
  Administered 2021-10-05: 1800 mg via SUBCUTANEOUS
  Filled 2021-10-05: qty 15

## 2021-10-05 NOTE — Patient Instructions (Signed)
[facility]  Discharge Instructions: ?Thank you for choosing [facility] to provide your oncology and hematology care.  ?If you have a lab appointment with the [facility], please come in thru the Main Entrance and check in at the main information desk. ? ?Wear comfortable clothing and clothing appropriate for easy access to any Portacath or PICC line.  ? ?We strive to give you quality time with your provider. You may need to reschedule your appointment if you arrive late (15 or more minutes).  Arriving late affects you and other patients whose appointments are after yours.  Also, if you miss three or more appointments without notifying the office, you may be dismissed from the clinic at the provider?s discretion.    ?  ?For prescription refill requests, have your pharmacy contact our office and allow 72 hours for refills to be completed.   ? ?Today you received the following chemotherapy and/or immunotherapy agents Daratumumab, return as scheduled. ?  ?To help prevent nausea and vomiting after your treatment, we encourage you to take your nausea medication as directed. ? ?BELOW ARE SYMPTOMS THAT SHOULD BE REPORTED IMMEDIATELY: ?*FEVER GREATER THAN 100.4 F (38 ?C) OR HIGHER ?*CHILLS OR SWEATING ?*NAUSEA AND VOMITING THAT IS NOT CONTROLLED WITH YOUR NAUSEA MEDICATION ?*UNUSUAL SHORTNESS OF BREATH ?*UNUSUAL BRUISING OR BLEEDING ?*URINARY PROBLEMS (pain or burning when urinating, or frequent urination) ?*BOWEL PROBLEMS (unusual diarrhea, constipation, pain near the anus) ?TENDERNESS IN MOUTH AND THROAT WITH OR WITHOUT PRESENCE OF ULCERS (sore throat, sores in mouth, or a toothache) ?UNUSUAL RASH, SWELLING OR PAIN  ?UNUSUAL VAGINAL DISCHARGE OR ITCHING  ? ?Items with * indicate a potential emergency and should be followed up as soon as possible or go to the Emergency Department if any problems should occur. ? ?Please show the CHEMOTHERAPY ALERT CARD or IMMUNOTHERAPY ALERT CARD at check-in to  the Emergency Department and triage nurse. ? ?Should you have questions after your visit or need to cancel or reschedule your appointment, please contact [facility] [phone]  and follow the prompts.  Office hours are 8:00 a.m. to 4:30 p.m. Monday - Friday. Please note that voicemails left after 4:00 p.m. may not be returned until the following business day.  We are closed weekends and major holidays. You have access to a nurse at all times for urgent questions. Please call the main number to the clinic [phone] and follow the prompts. ? ?For any non-urgent questions, you may also contact your provider using MyChart. We now offer e-Visits for anyone 83 and older to request care online for non-urgent symptoms. For details visit [email or web address]. ?  ?Also download the MyChart app! Go to the app store, search "MyChart", open the app, select [place], and log in with your MyChart username and password. ? ?Due to Covid, a mask is required upon entering the hospital/clinic. If you do not have a mask, one will be given to you upon arrival. For doctor visits, patients may have 1 support person aged 52 or older with them. For treatment visits, patients cannot have anyone with them due to current Covid guidelines and our immunocompromised population.  ?

## 2021-10-05 NOTE — Progress Notes (Signed)
Patient presents today for Daratumumab and Xgeva. ?Patient due for Xgeva today, Calcium 7.4 and Albumin is 3.8, per Dr. Delton Coombes, Courtney Rogers today. ?Patient tolerated Daratumumab injection with no complaints voiced. See MAR for details. Lab reviewed. Injection site clean and dry with no bruising or swelling noted at site. Band aid applied. Vss with discharge and left in satisfactory condition with nos/s of distress noted.   ?

## 2021-10-06 ENCOUNTER — Other Ambulatory Visit: Payer: Self-pay | Admitting: Family Medicine

## 2021-10-06 ENCOUNTER — Other Ambulatory Visit: Payer: Self-pay | Admitting: Nurse Practitioner

## 2021-10-06 DIAGNOSIS — I10 Essential (primary) hypertension: Secondary | ICD-10-CM

## 2021-10-07 ENCOUNTER — Other Ambulatory Visit: Payer: Self-pay | Admitting: Nurse Practitioner

## 2021-10-07 DIAGNOSIS — I10 Essential (primary) hypertension: Secondary | ICD-10-CM

## 2021-10-07 MED ORDER — METOPROLOL TARTRATE 50 MG PO TABS: 50.0000 mg | ORAL_TABLET | Freq: Two times a day (BID) | ORAL | 1 refills | Status: AC

## 2021-10-12 ENCOUNTER — Ambulatory Visit (HOSPITAL_COMMUNITY): Payer: BC Managed Care – PPO

## 2021-10-12 ENCOUNTER — Inpatient Hospital Stay (HOSPITAL_COMMUNITY): Payer: Medicare Other

## 2021-10-12 ENCOUNTER — Other Ambulatory Visit (HOSPITAL_COMMUNITY): Payer: BC Managed Care – PPO

## 2021-10-12 ENCOUNTER — Encounter (HOSPITAL_COMMUNITY): Payer: Self-pay

## 2021-10-12 VITALS — BP 101/62 | HR 88 | Temp 97.2°F | Resp 18 | Wt 160.2 lb

## 2021-10-12 DIAGNOSIS — Z5112 Encounter for antineoplastic immunotherapy: Secondary | ICD-10-CM | POA: Diagnosis not present

## 2021-10-12 DIAGNOSIS — D509 Iron deficiency anemia, unspecified: Secondary | ICD-10-CM

## 2021-10-12 DIAGNOSIS — C9000 Multiple myeloma not having achieved remission: Secondary | ICD-10-CM

## 2021-10-12 DIAGNOSIS — R7989 Other specified abnormal findings of blood chemistry: Secondary | ICD-10-CM

## 2021-10-12 LAB — CBC WITH DIFFERENTIAL/PLATELET
Abs Immature Granulocytes: 0.02 10*3/uL (ref 0.00–0.07)
Basophils Absolute: 0.1 10*3/uL (ref 0.0–0.1)
Basophils Relative: 3 %
Eosinophils Absolute: 0.7 10*3/uL — ABNORMAL HIGH (ref 0.0–0.5)
Eosinophils Relative: 15 %
HCT: 33.4 % — ABNORMAL LOW (ref 36.0–46.0)
Hemoglobin: 10.9 g/dL — ABNORMAL LOW (ref 12.0–15.0)
Immature Granulocytes: 0 %
Lymphocytes Relative: 14 %
Lymphs Abs: 0.7 10*3/uL (ref 0.7–4.0)
MCH: 32.5 pg (ref 26.0–34.0)
MCHC: 32.6 g/dL (ref 30.0–36.0)
MCV: 99.7 fL (ref 80.0–100.0)
Monocytes Absolute: 0.5 10*3/uL (ref 0.1–1.0)
Monocytes Relative: 11 %
Neutro Abs: 2.7 10*3/uL (ref 1.7–7.7)
Neutrophils Relative %: 57 %
Platelets: 215 10*3/uL (ref 150–400)
RBC: 3.35 MIL/uL — ABNORMAL LOW (ref 3.87–5.11)
RDW: 16.2 % — ABNORMAL HIGH (ref 11.5–15.5)
WBC: 4.7 10*3/uL (ref 4.0–10.5)
nRBC: 0 % (ref 0.0–0.2)

## 2021-10-12 LAB — MAGNESIUM: Magnesium: 2.2 mg/dL (ref 1.7–2.4)

## 2021-10-12 LAB — COMPREHENSIVE METABOLIC PANEL
ALT: 16 U/L (ref 0–44)
AST: 19 U/L (ref 15–41)
Albumin: 4.2 g/dL (ref 3.5–5.0)
Alkaline Phosphatase: 94 U/L (ref 38–126)
Anion gap: 7 (ref 5–15)
BUN: 21 mg/dL (ref 8–23)
CO2: 26 mmol/L (ref 22–32)
Calcium: 8.2 mg/dL — ABNORMAL LOW (ref 8.9–10.3)
Chloride: 104 mmol/L (ref 98–111)
Creatinine, Ser: 1.35 mg/dL — ABNORMAL HIGH (ref 0.44–1.00)
GFR, Estimated: 42 mL/min — ABNORMAL LOW (ref >60)
Glucose, Bld: 95 mg/dL (ref 70–99)
Potassium: 4 mmol/L (ref 3.5–5.1)
Sodium: 137 mmol/L (ref 135–145)
Total Bilirubin: 0.5 mg/dL (ref 0.3–1.2)
Total Protein: 6.6 g/dL (ref 6.5–8.1)

## 2021-10-12 LAB — LACTATE DEHYDROGENASE: LDH: 147 U/L (ref 98–192)

## 2021-10-12 MED ORDER — SODIUM CHLORIDE 0.9 % IV SOLN: INTRAVENOUS | Status: DC

## 2021-10-12 MED ORDER — DEXAMETHASONE 4 MG PO TABS
20.0000 mg | ORAL_TABLET | Freq: Once | ORAL | Status: AC
  Administered 2021-10-12: 20 mg via ORAL
  Filled 2021-10-12: qty 5

## 2021-10-12 MED ORDER — ACETAMINOPHEN 325 MG PO TABS
650.0000 mg | ORAL_TABLET | Freq: Once | ORAL | Status: AC
  Administered 2021-10-12: 650 mg via ORAL
  Filled 2021-10-12: qty 2

## 2021-10-12 MED ORDER — DARATUMUMAB-HYALURONIDASE-FIHJ 1800-30000 MG-UT/15ML ~~LOC~~ SOLN
1800.0000 mg | Freq: Once | SUBCUTANEOUS | Status: AC
  Administered 2021-10-12: 1800 mg via SUBCUTANEOUS
  Filled 2021-10-12: qty 15

## 2021-10-12 MED ORDER — DENOSUMAB 120 MG/1.7ML ~~LOC~~ SOLN
120.0000 mg | Freq: Once | SUBCUTANEOUS | Status: AC
  Administered 2021-10-12: 120 mg via SUBCUTANEOUS
  Filled 2021-10-12: qty 1.7

## 2021-10-12 MED ORDER — BORTEZOMIB CHEMO SQ INJECTION 3.5 MG (2.5MG/ML)
1.3000 mg/m2 | Freq: Once | INTRAMUSCULAR | Status: AC
  Administered 2021-10-12: 2.25 mg via SUBCUTANEOUS
  Filled 2021-10-12: qty 0.9

## 2021-10-12 MED ORDER — DIPHENHYDRAMINE HCL 25 MG PO CAPS
25.0000 mg | ORAL_CAPSULE | Freq: Once | ORAL | Status: AC
  Administered 2021-10-12: 25 mg via ORAL
  Filled 2021-10-12: qty 1

## 2021-10-12 NOTE — Progress Notes (Signed)
Patient presents today for Day 1 Cycle 4 receiving Dara and Velcade, her labs are within treatment parameters,Creatinine jumped from 0.86 last week to 1.35 this week. Calcium is 8.2 and Albumin is 4.2, Dr. Delton Coombes made aware.  Received orders for patient okay for treatment today, with an additional order for 500 mL of normal saline and patient can receive Delton See, pharmacy aware.  Patient tolerated 500 mL infusion with no complaints voiced. Peripheral IV site clean and dry with good blood return noted before and after infusion. Band aid applied. Patient tolerated Daratumumab, Velcade and Xgeva injections with no complaints voiced. See MAR for details. Lab reviewed. Injection site clean and dry with no bruising or swelling noted at site. Band aid applied. Vss with discharge and left in satisfactory condition with nos/s of distress noted.

## 2021-10-12 NOTE — Patient Instructions (Signed)
[facility]  Discharge Instructions: Thank you for choosing [facility] to provide your oncology and hematology care.  If you have a lab appointment with the [facility], please come in thru the Main Entrance and check in at the main information desk.  Wear comfortable clothing and clothing appropriate for easy access to any Portacath or PICC line.   We strive to give you quality time with your provider. You may need to reschedule your appointment if you arrive late (15 or more minutes).  Arriving late affects you and other patients whose appointments are after yours.  Also, if you miss three or more appointments without notifying the office, you may be dismissed from the clinic at the provider's discretion.      For prescription refill requests, have your pharmacy contact our office and allow 72 hours for refills to be completed.    Today you received the following chemotherapy and/or immunotherapy agents Daratumumab, Velcade, Xgeva, and 54m of normal saline, return as scheduled.   To help prevent nausea and vomiting after your treatment, we encourage you to take your nausea medication as directed.  BELOW ARE SYMPTOMS THAT SHOULD BE REPORTED IMMEDIATELY: *FEVER GREATER THAN 100.4 F (38 C) OR HIGHER *CHILLS OR SWEATING *NAUSEA AND VOMITING THAT IS NOT CONTROLLED WITH YOUR NAUSEA MEDICATION *UNUSUAL SHORTNESS OF BREATH *UNUSUAL BRUISING OR BLEEDING *URINARY PROBLEMS (pain or burning when urinating, or frequent urination) *BOWEL PROBLEMS (unusual diarrhea, constipation, pain near the anus) TENDERNESS IN MOUTH AND THROAT WITH OR WITHOUT PRESENCE OF ULCERS (sore throat, sores in mouth, or a toothache) UNUSUAL RASH, SWELLING OR PAIN  UNUSUAL VAGINAL DISCHARGE OR ITCHING   Items with * indicate a potential emergency and should be followed up as soon as possible or go to the Emergency Department if any problems should occur.  Please show the CHEMOTHERAPY ALERT  CARD or IMMUNOTHERAPY ALERT CARD at check-in to the Emergency Department and triage nurse.  Should you have questions after your visit or need to cancel or reschedule your appointment, please contact [phone] and follow the prompts.  Office hours are 8:00 a.m. to 4:30 p.m. Monday - Friday. Please note that voicemails left after 4:00 p.m. may not be returned until the following business day.  We are closed weekends and major holidays. You have access to a nurse at all times for urgent questions. Please call the main number to the clinic [phone] follow the prompts.  For any non-urgent questions, you may also contact your provider using MyChart. We now offer e-Visits for anyone 195and older to request care online for non-urgent symptoms. For details visit [email or web address]   Also download the MyChart app! Go to the app store, search "MyChart", open the app, select [place] [facility], and log in with your MyChart username and password.  Due to Covid, a mask is required upon entering the hospital/clinic. If you do not have a mask, one will be given to you upon arrival. For doctor visits, patients may have 1 support person aged 110or older with them. For treatment visits, patients cannot have anyone with them due to current Covid guidelines and our immunocompromised population.

## 2021-10-13 LAB — KAPPA/LAMBDA LIGHT CHAINS
Kappa free light chain: 16.6 mg/L (ref 3.3–19.4)
Kappa, lambda light chain ratio: 1.27 (ref 0.26–1.65)
Lambda free light chains: 13.1 mg/L (ref 5.7–26.3)

## 2021-10-14 LAB — PROTEIN ELECTROPHORESIS, SERUM
A/G Ratio: 1.7 (ref 0.7–1.7)
Albumin ELP: 3.8 g/dL (ref 2.9–4.4)
Alpha-1-Globulin: 0.3 g/dL (ref 0.0–0.4)
Alpha-2-Globulin: 0.7 g/dL (ref 0.4–1.0)
Beta Globulin: 0.8 g/dL (ref 0.7–1.3)
Gamma Globulin: 0.5 g/dL (ref 0.4–1.8)
Globulin, Total: 2.2 g/dL (ref 2.2–3.9)
M-Spike, %: 0.2 g/dL — ABNORMAL HIGH
Total Protein ELP: 6 g/dL (ref 6.0–8.5)

## 2021-10-15 ENCOUNTER — Ambulatory Visit (HOSPITAL_COMMUNITY): Payer: BC Managed Care – PPO

## 2021-10-15 ENCOUNTER — Inpatient Hospital Stay (HOSPITAL_COMMUNITY): Payer: Medicare Other

## 2021-10-15 VITALS — BP 121/67 | HR 88 | Temp 97.4°F | Resp 18 | Wt 160.2 lb

## 2021-10-15 DIAGNOSIS — Z5112 Encounter for antineoplastic immunotherapy: Secondary | ICD-10-CM | POA: Diagnosis not present

## 2021-10-15 DIAGNOSIS — C9000 Multiple myeloma not having achieved remission: Secondary | ICD-10-CM

## 2021-10-15 MED ORDER — PROCHLORPERAZINE MALEATE 10 MG PO TABS
10.0000 mg | ORAL_TABLET | Freq: Once | ORAL | Status: AC
  Administered 2021-10-15: 10 mg via ORAL
  Filled 2021-10-15: qty 1

## 2021-10-15 MED ORDER — BORTEZOMIB CHEMO SQ INJECTION 3.5 MG (2.5MG/ML)
1.3000 mg/m2 | Freq: Once | INTRAMUSCULAR | Status: AC
  Administered 2021-10-15: 2.25 mg via SUBCUTANEOUS
  Filled 2021-10-15: qty 0.9

## 2021-10-15 NOTE — Patient Instructions (Signed)
[facility]  Discharge Instructions: Thank you for choosing [facility] to provide your oncology and hematology care.  If you have a lab appointment with the [facility], please come in thru the Main Entrance and check in at the main information desk.  Wear comfortable clothing and clothing appropriate for easy access to any Portacath or PICC line.   We strive to give you quality time with your provider. You may need to reschedule your appointment if you arrive late (15 or more minutes).  Arriving late affects you and other patients whose appointments are after yours.  Also, if you miss three or more appointments without notifying the office, you may be dismissed from the clinic at the provider's discretion.      For prescription refill requests, have your pharmacy contact our office and allow 72 hours for refills to be completed.    Today you received the following chemotherapy and/or immunotherapy agents Velcade      To help prevent nausea and vomiting after your treatment, we encourage you to take your nausea medication as directed.  BELOW ARE SYMPTOMS THAT SHOULD BE REPORTED IMMEDIATELY: *FEVER GREATER THAN 100.4 F (38 C) OR HIGHER *CHILLS OR SWEATING *NAUSEA AND VOMITING THAT IS NOT CONTROLLED WITH YOUR NAUSEA MEDICATION *UNUSUAL SHORTNESS OF BREATH *UNUSUAL BRUISING OR BLEEDING *URINARY PROBLEMS (pain or burning when urinating, or frequent urination) *BOWEL PROBLEMS (unusual diarrhea, constipation, pain near the anus) TENDERNESS IN MOUTH AND THROAT WITH OR WITHOUT PRESENCE OF ULCERS (sore throat, sores in mouth, or a toothache) UNUSUAL RASH, SWELLING OR PAIN  UNUSUAL VAGINAL DISCHARGE OR ITCHING   Items with * indicate a potential emergency and should be followed up as soon as possible or go to the Emergency Department if any problems should occur.  Please show the CHEMOTHERAPY ALERT CARD or IMMUNOTHERAPY ALERT CARD at check-in to the Emergency  Department and triage nurse.  Should you have questions after your visit or need to cancel or reschedule your appointment, please contact [facility] [phone]  and follow the prompts.  Office hours are 8:00 a.m. to 4:30 p.m. Monday - Friday. Please note that voicemails left after 4:00 p.m. may not be returned until the following business day.  We are closed weekends and major holidays. You have access to a nurse at all times for urgent questions. Please call the main number to the clinic [phone] and follow the prompts.  For any non-urgent questions, you may also contact your provider using MyChart. We now offer e-Visits for anyone 57 and older to request care online for non-urgent symptoms. For details visit [email or web address].   Also download the MyChart app! Go to the app store, search "MyChart", open the app, select [place], and log in with your MyChart username and password.  Due to Covid, a mask is required upon entering the hospital/clinic. If you do not have a mask, one will be given to you upon arrival. For doctor visits, patients may have 1 support person aged 72 or older with them. For treatment visits, patients cannot have anyone with them due to current Covid guidelines and our immunocompromised population.

## 2021-10-15 NOTE — Progress Notes (Signed)
Patient presents today for Velcade injection per providers order.  Vital signs and labs within parameters for treatment.  Patient has no new complaints at this time.  Stable during administration without incident; injection site WNL; see MAR for injection details.  Patient tolerated procedure well and without incident.  No questions or complaints noted at this time.   Discharge from clinic ambulatory in stable condition.  Alert and oriented X 3.  Follow up with [facility] as scheduled.

## 2021-10-19 ENCOUNTER — Other Ambulatory Visit (HOSPITAL_COMMUNITY): Payer: BC Managed Care – PPO

## 2021-10-19 ENCOUNTER — Inpatient Hospital Stay (HOSPITAL_BASED_OUTPATIENT_CLINIC_OR_DEPARTMENT_OTHER): Payer: Medicare Other | Admitting: Hematology

## 2021-10-19 ENCOUNTER — Ambulatory Visit (HOSPITAL_COMMUNITY): Payer: BC Managed Care – PPO | Admitting: Hematology

## 2021-10-19 ENCOUNTER — Ambulatory Visit (HOSPITAL_COMMUNITY): Payer: BC Managed Care – PPO

## 2021-10-19 ENCOUNTER — Inpatient Hospital Stay (HOSPITAL_COMMUNITY): Payer: Medicare Other

## 2021-10-19 ENCOUNTER — Other Ambulatory Visit (HOSPITAL_COMMUNITY): Payer: Self-pay

## 2021-10-19 VITALS — BP 91/55 | HR 67 | Temp 98.3°F | Resp 16 | Ht 63.0 in | Wt 163.2 lb

## 2021-10-19 DIAGNOSIS — Z5112 Encounter for antineoplastic immunotherapy: Secondary | ICD-10-CM | POA: Diagnosis not present

## 2021-10-19 DIAGNOSIS — C9000 Multiple myeloma not having achieved remission: Secondary | ICD-10-CM

## 2021-10-19 LAB — CBC WITH DIFFERENTIAL/PLATELET
Abs Immature Granulocytes: 0.01 10*3/uL (ref 0.00–0.07)
Basophils Absolute: 0.1 10*3/uL (ref 0.0–0.1)
Basophils Relative: 2 %
Eosinophils Absolute: 0.3 10*3/uL (ref 0.0–0.5)
Eosinophils Relative: 7 %
HCT: 32.3 % — ABNORMAL LOW (ref 36.0–46.0)
Hemoglobin: 10.6 g/dL — ABNORMAL LOW (ref 12.0–15.0)
Immature Granulocytes: 0 %
Lymphocytes Relative: 15 %
Lymphs Abs: 0.6 10*3/uL — ABNORMAL LOW (ref 0.7–4.0)
MCH: 32.7 pg (ref 26.0–34.0)
MCHC: 32.8 g/dL (ref 30.0–36.0)
MCV: 99.7 fL (ref 80.0–100.0)
Monocytes Absolute: 0.8 10*3/uL (ref 0.1–1.0)
Monocytes Relative: 18 %
Neutro Abs: 2.3 10*3/uL (ref 1.7–7.7)
Neutrophils Relative %: 58 %
Platelets: 96 10*3/uL — ABNORMAL LOW (ref 150–400)
RBC: 3.24 MIL/uL — ABNORMAL LOW (ref 3.87–5.11)
RDW: 16 % — ABNORMAL HIGH (ref 11.5–15.5)
WBC: 4.1 10*3/uL (ref 4.0–10.5)
nRBC: 0 % (ref 0.0–0.2)

## 2021-10-19 LAB — MAGNESIUM: Magnesium: 2 mg/dL (ref 1.7–2.4)

## 2021-10-19 LAB — COMPREHENSIVE METABOLIC PANEL
ALT: 17 U/L (ref 0–44)
AST: 19 U/L (ref 15–41)
Albumin: 4 g/dL (ref 3.5–5.0)
Alkaline Phosphatase: 75 U/L (ref 38–126)
Anion gap: 7 (ref 5–15)
BUN: 13 mg/dL (ref 8–23)
CO2: 27 mmol/L (ref 22–32)
Calcium: 7.4 mg/dL — ABNORMAL LOW (ref 8.9–10.3)
Chloride: 105 mmol/L (ref 98–111)
Creatinine, Ser: 0.76 mg/dL (ref 0.44–1.00)
GFR, Estimated: >60 mL/min (ref >60)
Glucose, Bld: 81 mg/dL (ref 70–99)
Potassium: 3.5 mmol/L (ref 3.5–5.1)
Sodium: 139 mmol/L (ref 135–145)
Total Bilirubin: 0.5 mg/dL (ref 0.3–1.2)
Total Protein: 6.3 g/dL — ABNORMAL LOW (ref 6.5–8.1)

## 2021-10-19 LAB — LACTATE DEHYDROGENASE: LDH: 135 U/L (ref 98–192)

## 2021-10-19 MED ORDER — DIPHENHYDRAMINE HCL 25 MG PO CAPS
25.0000 mg | ORAL_CAPSULE | Freq: Once | ORAL | Status: AC
  Administered 2021-10-19: 25 mg via ORAL
  Filled 2021-10-19: qty 1

## 2021-10-19 MED ORDER — DEXAMETHASONE 4 MG PO TABS
20.0000 mg | ORAL_TABLET | Freq: Once | ORAL | Status: AC
  Administered 2021-10-19: 20 mg via ORAL
  Filled 2021-10-19: qty 5

## 2021-10-19 MED ORDER — DARATUMUMAB-HYALURONIDASE-FIHJ 1800-30000 MG-UT/15ML ~~LOC~~ SOLN
1800.0000 mg | Freq: Once | SUBCUTANEOUS | Status: AC
  Administered 2021-10-19: 1800 mg via SUBCUTANEOUS
  Filled 2021-10-19: qty 15

## 2021-10-19 MED ORDER — LENALIDOMIDE 15 MG PO CAPS: 15.0000 mg | ORAL_CAPSULE | Freq: Every day | ORAL | 0 refills | Status: DC

## 2021-10-19 MED ORDER — ACETAMINOPHEN 325 MG PO TABS
650.0000 mg | ORAL_TABLET | Freq: Once | ORAL | Status: AC
  Administered 2021-10-19: 650 mg via ORAL
  Filled 2021-10-19: qty 2

## 2021-10-19 MED ORDER — BORTEZOMIB CHEMO SQ INJECTION 3.5 MG (2.5MG/ML)
1.0000 mg/m2 | Freq: Once | INTRAMUSCULAR | Status: AC
  Administered 2021-10-19: 1.75 mg via SUBCUTANEOUS
  Filled 2021-10-19: qty 0.7

## 2021-10-19 NOTE — Progress Notes (Signed)
[place], [place] [address]   CLINIC:  Medical Oncology/Hematology  PCP:  Renee Rival, FNP [address] [place] / [place] [address] [phone]   REASON FOR VISIT:  Follow-up for multiple myeloma  PRIOR THERAPY: none  NGS Results: not done  CURRENT THERAPY: multiple myeloma  BRIEF ONCOLOGIC HISTORY:  Oncology History  IgA myeloma ([place])  05/06/2021 Initial Diagnosis   IgA myeloma ([place])    08/10/2021 -  Chemotherapy   Patient is on Treatment Plan : MYELOMA NEWLY DIAGNOSED TRANSPLANT CANDIDATE DaraVRd (Daratumumab SQ) q21d x 6 Cycles (Induction/Consolidation)        CANCER STAGING:  Cancer Staging  No matching staging information was found for the patient.  INTERVAL HISTORY:  [name], a 71 y.o. female, returns for routine follow-up and consideration for next cycle of chemotherapy. [name] was last seen on 09/21/2021.  Due for day #8 cycle #4 of DaraVRd today.   Overall, she tells me she has been feeling pretty well. She reports low energy, and she has tingling in her left hand and feet which is worse in her toes and started 1 month ago; she denies associated pain, burning, and pins-and-needles. She denies nausea. She is taking 2 tablets of Calcium daily. She is able to do all of her typical daily activities at home. She lives at home with her husband and sister. She expresses she does not wish to move forward with a transplant at this time. She reports soreness in her left hip when she sits down. She denies n/v/d/c, CP, and leg swellings. Her appetite is good despite her taste being poor.   Overall, she feels ready for next cycle of chemo today.    REVIEW OF SYSTEMS:  Review of Systems  Constitutional:  Positive for fatigue. Negative for appetite change.  Cardiovascular:  Negative for chest pain and leg swelling.  Gastrointestinal:  Negative for constipation, diarrhea, nausea and  vomiting.  Musculoskeletal:  Positive for arthralgias (4/10 L hip).  Neurological:  Positive for numbness (L hand and toes).  All other systems reviewed and are negative.  PAST MEDICAL/SURGICAL HISTORY:  Past Medical History:  Diagnosis Date   Acute myocardial infarction [facility]) 2009   CAD/no stent medically managed   Anxiety disorder    CAD (coronary artery disease)    Cancer (HCC)    Hyperlipidemia    Hypertension    IBS (irritable bowel syndrome)    Non-ulcer dyspepsia 04/30/2009   Qualifier: Diagnosis of  By: Oneida Alar MD, Sandi L    Overactive bladder    PE (pulmonary embolism) 10/2012   Presence of permanent cardiac pacemaker    Sleep apnea    Past Surgical History:  Procedure Laterality Date   ABDOMINAL HYSTERECTOMY     BSO secondary to cyst     CARDIAC CATHETERIZATION     COLONOSCOPY  12/2009   Dr. West Carbo, propofol, normal. Next TCS 12/2019   COLONOSCOPY N/A 05/22/2017   Examined portion ileum was normal, significant looping of colon, external hemorrhoids and rectal bleeding due to internal hemorrhoids, mild diverticulosis procedure: COLONOSCOPY;  Surgeon: Danie Binder, MD;  [facility];  Service: Endoscopy;  Laterality: N/A;  10:30am   ESOPHAGOGASTRODUODENOSCOPY  05/11/2009   schatzki ring/small hiatal hernia/path:gastritis   ESOPHAGOGASTRODUODENOSCOPY N/A 05/22/2017   Multiple gastric polyps with biopsy benign fundic gland polyp, small bowel biopsy negative for celiac, gastric biopsy with mild gastritis but no H. pylori procedure: ESOPHAGOGASTRODUODENOSCOPY (  EGD);  Surgeon: Danie Binder, MD;  [facility];  Service: Endoscopy;  Laterality: N/A;   ESOPHAGOGASTRODUODENOSCOPY (EGD) WITH ESOPHAGEAL DILATION N/A 04/01/2013   IPJ:[id number] at the gastroesophageal juction/multiple small polyps/mild gastritis   GIVENS CAPSULE STUDY N/A 06/07/2017   Frequent gastric erosions, normal small bowel mucosa.  Procedure: GIVENS CAPSULE STUDY;  Surgeon:  Danie Binder, MD;  [facility]: [facility];  Service: Endoscopy;  Laterality: N/A;  7:30am   INSERT / REPLACE / REMOVE PACEMAKER     Last year per pt.(can't remember date)   REPLACEMENT TOTAL KNEE Right 08/2020    SOCIAL HISTORY:  Social History   Socioeconomic History   Marital status: Married    Spouse name: Games developer   Number of children: 3   Years of education: 16   Highest education level: Not on file  Occupational History   Not on file  Tobacco Use   Smoking status: Never   Smokeless tobacco: Never   Tobacco comments:    Never smoked   Vaping Use   Vaping Use: Never used  Substance and Sexual Activity   Alcohol use: Yes    Alcohol/week: 0.0 standard drinks    Comment: occ wine   Drug use: No   Sexual activity: Not Currently  Other Topics Concern   Not on file  Social History Narrative      Lives with husband-51 years 56 in Aug 2021   Daughter is close by, 2 sons live further away   One IN [place],ONE IN [place], [place].        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   Enjoys: reading, young adult      Diet: eats all food groups    Caffeine: coffee 1, tea daily soda-1 daily   Water: 1-2 cups      Wears seat belt    Does not use phone while driving   Oceanographer at home    Public house manager -locked up      Left handed   One story home   Drinks caffeine   Social Determinants of Health   Financial Resource Strain: Low Risk    Difficulty of Paying Living Expenses: Not hard at all  Food Insecurity: No Food Insecurity   Worried About Charity fundraiser in the Last Year: Never true   Arboriculturist in the Last Year: Never true  Transportation Needs: No Transportation Needs   Lack of Transportation (Medical): No   Lack of Transportation (Non-Medical): No  Physical Activity: Inactive   Days of Exercise per Week: 0 days   Minutes of Exercise per Session: 0 min  Stress: Stress Concern Present   Feeling of Stress : Rather  much  Social Connections: Moderately Integrated   Frequency of Communication with Friends and Family: Once a week   Frequency of Social Gatherings with Friends and Family: Once a week   Attends Religious Services: More than 4 times per year   Active Member of [employer] or Organizations: Yes   Attends Music therapist: More than 4 times per year   Marital Status: Married  Human resources officer Violence: Not At Risk   Fear of Current or Ex-Partner: No   Emotionally Abused: No   Physically Abused: No   Sexually Abused: No    FAMILY HISTORY:  Family History  Problem Relation Age of Onset   Colon polyps Neg Hx    Colon cancer Neg Hx  CURRENT MEDICATIONS:  Current Outpatient Medications  Medication Sig Dispense Refill   acetaminophen (TYLENOL) 500 MG tablet Take 1,000 mg by mouth 2 (two) times daily as needed for moderate pain or headache.     acyclovir (ZOVIRAX) 400 MG tablet Take 1 tablet (400 mg total) by mouth 2 (two) times daily. 60 tablet 6   albuterol (PROVENTIL HFA;VENTOLIN HFA) 108 (90 BASE) MCG/ACT inhaler Inhale 2 puffs into the lungs every 6 (six) hours as needed for shortness of breath.     amLODipine (NORVASC) 10 MG tablet Take 1 tablet (10 mg total) by mouth daily. 90 tablet 1   amoxicillin-clavulanate (AUGMENTIN) 875-125 MG tablet Take 1 tablet by mouth 2 (two) times daily.     Ascorbic Acid 500 MG CHEW Chew by mouth.     aspirin EC 81 MG tablet Take 81 mg by mouth at bedtime.     B Complex Vitamins (VITAMIN B COMPLEX) TABS Take 1 tablet by mouth daily.     bortezomib SQ (VELCADE) 3.5 MG injection Inject into the skin once.     busPIRone (BUSPAR) 5 MG tablet Take 1 tablet (5 mg total) by mouth daily. 30 tablet 3   Calcium Carbonate-Vitamin D (CALCIUM 600+D PO) Take 1 tablet by mouth 2 (two) times daily.     cefdinir (OMNICEF) 300 MG capsule Take by mouth.     chlorthalidone (HYGROTON) 25 MG tablet Take 25 mg by mouth daily.      cholecalciferol (VITAMIN D) 25  MCG (1000 UNIT) tablet Take 1,000 Units by mouth daily.     cyanocobalamin 1000 MCG tablet Take 1 tablet by mouth daily.     cyclobenzaprine (FLEXERIL) 10 MG tablet Take 0.5 tablets (5 mg total) by mouth 2 (two) times daily as needed for muscle spasms. 20 tablet 0   Daratumumab-Hyaluronidase-fihj (DARZALEX FASPRO [place]) Inject into the skin.     fluticasone (FLONASE) 50 MCG/ACT nasal spray USE 2 SPRAYS IN EACH NOSTRIL ONCE DAILY. 16 g 0   fluticasone-salmeterol (ADVAIR) 250-50 MCG/ACT AEPB Inhale 1 puff into the lungs 2 (two) times daily.     folic acid (FOLVITE) 1 MG tablet TAKE 1 TABLET BY MOUTH ONCE DAILY. (Patient taking differently: Take 1 mg by mouth daily.) 30 tablet 11   gabapentin (NEURONTIN) 600 MG tablet Take 600 mg by mouth 2 (two) times daily.     lenalidomide (REVLIMID) 15 MG capsule Take 1 capsule (15 mg total) by mouth daily. 21 days on, 7 days off every 28 days 21 capsule 0   lidocaine (XYLOCAINE) 2 % solution TAKE 2 TEASPOONFULS BEFORE MEALS AND AT BEDTIME AS NEEDED MAY REPEAT EVERY 4 HOURS. (MAX OF 8 DOSES PER DAY) 300 mL 0   Lidocaine HCl (XOLIDO) 2 % CREA Apply 1 application topically daily as needed (arthritisis).     losartan (COZAAR) 100 MG tablet Take 100 mg by mouth daily.     magnesium oxide (MAG-OX) 400 (240 Mg) MG tablet Take 1 tablet (400 mg total) by mouth 2 (two) times daily. 60 tablet 4   metoprolol tartrate (LOPRESSOR) 50 MG tablet Take 1 tablet (50 mg total) by mouth 2 (two) times daily. 180 tablet 1   mirtazapine (REMERON) 15 MG tablet Take 15 mg by mouth at bedtime.     montelukast (SINGULAIR) 10 MG tablet Take 10 mg by mouth daily.      MYRBETRIQ 25 MG TB24 tablet Take 25 mg by mouth daily.     nitroGLYCERIN (NITROSTAT) 0.4 MG SL tablet  Place 0.4 mg under the tongue every 5 (five) minutes as needed for chest pain.     pantoprazole (PROTONIX) 40 MG tablet Take 40 mg by mouth daily.     potassium chloride (KLOR-CON M) 10 MEQ tablet Take 1 tablet (10 mEq total) by  mouth 2 (two) times daily. 60 tablet 6   potassium chloride SA (KLOR-CON M) 20 MEQ tablet Take 1 tablet (20 mEq total) by mouth 3 (three) times daily. 21 tablet 0   primidone (MYSOLINE) 50 MG tablet Take 50 mg by mouth daily.     Probiotic Product (GNP PROBIOTIC COLON SUPPORT) CAPS Take 1 capsule by mouth daily.     prochlorperazine (COMPAZINE) 10 MG tablet Take 1 tablet (10 mg total) by mouth every 6 (six) hours as needed (Nausea or vomiting). 30 tablet 1   sertraline (ZOLOFT) 100 MG tablet Take 200 mg by mouth daily.      Simethicone (GAS-X PO) Take 2 tablets by mouth daily as needed (gas).     simvastatin (ZOCOR) 40 MG tablet Take 40 mg by mouth at bedtime.     trimethoprim-polymyxin b (POLYTRIM) ophthalmic solution SMARTSIG:In Eye(s)     warfarin (COUMADIN) 3 MG tablet Take 3 mg by mouth daily.     No current facility-administered medications for this visit.    ALLERGIES:  Allergies  Allergen Reactions   Amoxicillin-Pot Clavulanate Other (See Comments)   Biaxin [Clarithromycin] Other (See Comments)    Stomach problems    Erythromycin    Lisinopril Swelling    PHYSICAL EXAM:  Performance status (ECOG): 1 - Symptomatic but completely ambulatory  There were no vitals filed for this visit. Wt Readings from Last 3 Encounters:  10/15/21 160 lb 3.2 oz (72.7 kg)  10/12/21 160 lb 3.2 oz (72.7 kg)  10/05/21 163 lb 12.8 oz (74.3 kg)   Physical Exam Vitals reviewed.  Constitutional:      Appearance: Normal appearance.  Cardiovascular:     Rate and Rhythm: Normal rate and regular rhythm.     Pulses: Normal pulses.     Heart sounds: Normal heart sounds.  Pulmonary:     Effort: Pulmonary effort is normal.     Breath sounds: Normal breath sounds.  Musculoskeletal:     Right lower leg: No edema.     Left lower leg: No edema.  Neurological:     General: No focal deficit present.     Mental Status: She is alert and oriented to person, place, and time.  Psychiatric:        Mood and  Affect: Mood normal.        Behavior: Behavior normal.    LABORATORY DATA:  I have reviewed the labs as listed.     Latest Ref Rng & Units 10/12/2021    8:36 AM 10/05/2021    7:58 AM 09/28/2021    9:35 AM  CBC  WBC 4.0 - 10.5 K/uL 4.7   4.3   5.1    Hemoglobin 12.0 - 15.0 g/dL 10.9   10.2   10.3    Hematocrit 36.0 - 46.0 % 33.4   32.1   31.1    Platelets 150 - 400 K/uL 215   121   122        Latest Ref Rng & Units 10/12/2021    8:36 AM 10/05/2021    7:58 AM 09/28/2021    9:35 AM  CMP  Glucose 70 - 99 mg/dL 95   64   85  BUN 8 - 23 mg/dL _0 Creatinine 0.44 - 1.00 mg/dL 1.35   0.86   0.74    Sodium 135 - 145 mmol/L 137   135   137    Potassium 3.5 - 5.1 mmol/L 4.0   3.9   3.6    Chloride 98 - 111 mmol/L 104   103   101    CO2 22 - 32 mmol/L _1 Calcium 8.9 - 10.3 mg/dL 8.2   7.4   7.1    Total Protein 6.5 - 8.1 g/dL 6.6   6.3   6.8    Total Bilirubin 0.3 - 1.2 mg/dL 0.5   0.7   0.8    Alkaline Phos 38 - 126 U/L 94   86   85    AST 15 - 41 U/L _2 ALT 0 - 44 U/L _3 DIAGNOSTIC IMAGING:  I have independently reviewed the scans and discussed with the patient. DG Chest 2 View  Result Date: 09/26/2021 CLINICAL DATA:  Right-sided chest pain.  Cough.  On chemotherapy. EXAM: CHEST - 2 VIEW COMPARISON:  Chest radiograph and CT 09/20/2021 FINDINGS: Left-sided pacemaker remains in place with lead tips overlying the right atrium and ventricle. Stable cardiomegaly. Unchanged mediastinal contours. Bandlike atelectasis or scarring in the right lower lobe and lingula. No confluent consolidation. No pleural effusion or pneumothorax. On limited evaluation, no acute osseous abnormalities are seen. IMPRESSION: Stable cardiomegaly. No acute abnormality. Electronically Signed   By: Keith Rake M.D.   On: 09/26/2021 20:42   DG Chest 2 View  Result Date: 09/20/2021 CLINICAL DATA:  Chest pain EXAM: CHEST - 2 VIEW COMPARISON:  04/07/2021 FINDINGS: The  lungs are clear without focal pneumonia, edema, pneumothorax or pleural effusion. Similar appearance of ill-defined atelectasis or scarring at the right base and right upper lobe. The cardio pericardial silhouette is enlarged. Left-sided permanent pacemaker evident. IMPRESSION: No acute cardiopulmonary findings. Electronically Signed   By: Misty Stanley M.D.   On: 09/20/2021 09:43   CT Angio Chest PE W and/or Wo Contrast  Result Date: 09/20/2021 CLINICAL DATA:  Pulmonary embolism (PE) suspected, high prob. History of multiple myeloma EXAM: CT ANGIOGRAPHY CHEST TECHNIQUE: Multidetector CT imaging of the chest was performed using the standard protocol during bolus administration of intravenous contrast. Multiplanar CT image reconstructions and MIPs were obtained to evaluate the vascular anatomy. RADIATION DOSE REDUCTION: This exam was performed according to the departmental dose-optimization program which includes automated exposure control, adjustment of the mA and/or kV according to patient size and/or use of iterative reconstruction technique. CONTRAST:  53m OMNIPAQUE IOHEXOL 350 MG/ML SOLN COMPARISON:  CT 11/14/2017 FINDINGS: Cardiovascular: Satisfactory opacification of the pulmonary arteries to the segmental level. No evidence of pulmonary embolism. Evaluation of the distal pulmonary arterial branches within the lung bases is limited by respiratory motion artifact. Chronically dilated pulmonary arteries. Midthoracic aortic arch measures 3.3 cm in diameter, unchanged. Mild cardiomegaly. No pericardial effusion. Left-sided implanted cardiac device. Mediastinum/Nodes: No enlarged mediastinal, hilar, or axillary lymph nodes. Thyroid gland, trachea, and esophagus demonstrate no significant findings. Lungs/Pleura: Low lung volumes with dependent bibasilar atelectasis. Additional band-like area of atelectasis at the right lung apex. No pleural effusion or pneumothorax. Upper abdomen: Spleen appears enlarged,  although is incompletely imaged. No acute findings within the  included upper abdomen. Musculoskeletal: No chest wall abnormality. No acute or significant osseous findings. Review of the MIP images confirms the above findings. IMPRESSION: 1. Negative for pulmonary embolism to the segmental level. 2. Low lung volumes with dependent bibasilar atelectasis. 3. Chronically dilated pulmonary arteries suggesting pulmonary arterial hypertension. 4. Suspected splenomegaly, incompletely imaged. Electronically Signed   By: Davina Poke D.O.   On: 09/20/2021 13:46     ASSESSMENT:  1.  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  Increased number of atypical plasma cells present 36% of all cell lines.  Plasma cells are kappa light chain restricted. -Unfortunately her specimen has reached cytogenetics and FISH lab week later due to bad weather and no viable plasma cells. -PET scan on 07/09/2019 showed no findings of active myeloma. -24-hour urine shows total protein 59 mg.  Urine immunofixation shows IgA kappa monoclonal protein.  LDH is 184. -Based on [facility] criteria she has high risk with about 45-50% probability of progression to myeloma in the next 2 years. -CTAP on 01/07/2020 shows acute superior endplate burst fracture of L1.  Mild associated paravertebral soft tissue thickening.  No other bone abnormalities. -Bone density test on 02/07/2020 shows T score -0.5. - Bone marrow biopsy on 07/12/2021: Hypercellular with increased number of atypical plasma cells representing 30% of cells, displaying kappa light chain restriction. - FISH panel: Gain of 1 q. (high risk), T p53 deletion (high risk), monosomy 13 (standard risk) - Cytogenetics: Complex karyotype. - PET scan (06/24/2021): New hypermetabolic bone lesions involving left anterior iliac crest, left mid humerus, right superior pubic ramus, bilateral mid femurs, left  calvarium.  Largest lytic lesion in the left iliac crest measuring 2.9 x 2.3 cm. - 2D echo on 07/29/2021: EF 50-55%.  There is distal septal and inferior apical hypokinesis.  (Carfilzomib based regimen was put on hold due to echo findings) - Dara VRD cycle 1 started on 08/10/2021.  2.  Social/family history: - She uses walker to ambulate since her right knee replacement leading to stiffness.  She lives with older sister and husband who has dementia. - She is independent of ADLs and IADLs.  She also drives.   PLAN:  1.  Stage II IgA kappa multiple myeloma, high risk with del 17p: - We have reviewed myeloma labs from 10/12/2021.  M spike improved to 0.2 g.  Kappa light chains are 16.6 and ratio of 1.27. - She tells me today that she is not interested in pursuing transplant option. - I have reviewed her labs today which showed platelet count 96 and LFTs are normal. - She will proceed with her cycle 4 today.  Continue Revlimid 15 mg 2 weeks on/1 week off. - We will dose reduce Velcade.  RTC 4 weeks for follow-up with repeat myeloma labs 1 week prior.     2.  Electrolyte abnormalities: - Continue magnesium twice daily and potassium 20 mg daily.  ALK lites are normal today.   3.  Pulmonary embolism (2015): - Continue Coumadin for PE indefinitely.  4.  Hypotension: - Blood pressure today is 82/53.  She is taking Norvasc 10 mg daily, metoprolol 50 mg half tablet twice daily and Cozaar 100 mg daily. - We will hold Norvasc.  5.  ID prophylaxis: - Continue acyclovir 400 mg twice daily.  6.  Myeloma bone disease: - Continue denosumab monthly.  She has mild hypocalcemia. - Will increase calcium to 2 tablets twice  daily.  7.  Peripheral neuropathy: - She has developed tingling in the toes more than fingertips, particularly left hand fingertips.  No neuropathic pains reported. - We will decrease Velcade to 1 mg per metered square.   Orders placed this encounter:  No orders of the defined types  were placed in this encounter.    Derek Jack, MD [place] [phone]   I, Thana Ates, am acting as a scribe for Dr. Derek Jack.  I, Derek Jack MD, have reviewed the above documentation for accuracy and completeness, and I agree with the above.

## 2021-10-19 NOTE — Patient Instructions (Addendum)
[place] at [facility] Discharge Instructions   You were seen and examined today by Dr. Delton Coombes.  He reviewed your lab work which is mostly stable. Your calcium is low at 7.4. Increase your calcium pills to 2 pills twice a day.   Your blood pressure is low. You are on 3 different blood pressure medications. Dr. Raliegh Ip wants you to STOP taking amlodipine (Norvasc). You may continue the metoprolol and losartan.   One of the shots you get called Velcade can cause the numbness and tingling you are experiencing in your fingertips and toes. We will reduce the dose of this medication to see if it helps alleviate this side effect.   We will proceed with your treatment today.  Return as scheduled.    Thank you for choosing [place] at [facility] to provide your oncology and hematology care.  To afford each patient quality time with our provider, please arrive at least 15 minutes before your scheduled appointment time.   If you have a lab appointment with the [facility] please come in thru the Main Entrance and check in at the main information desk.  You need to re-schedule your appointment should you arrive 10 or more minutes late.  We strive to give you quality time with our providers, and arriving late affects you and other patients whose appointments are after yours.  Also, if you no show three or more times for appointments you may be dismissed from the clinic at the providers discretion.     Again, thank you for choosing [facility].  Our hope is that these requests will decrease the amount of time that you wait before being seen by our physicians.       _____________________________________________________________  Should you have questions after your visit to [facility], please contact our office at [phone] and follow the prompts.  Our office hours are 8:00 a.m. and 4:30 p.m. Monday - Friday.  Please note  that voicemails left after 4:00 p.m. may not be returned until the following business day.  We are closed weekends and major holidays.  You do have access to a nurse 24-7, just call the main number to the clinic [phone] and do not press any options, hold on the line and a nurse will answer the phone.    For prescription refill requests, have your pharmacy contact our office and allow 72 hours.    Due to Covid, you will need to wear a mask upon entering the hospital. If you do not have a mask, a mask will be given to you at the Main Entrance upon arrival. For doctor visits, patients may have 1 support person age 34 or older with them. For treatment visits, patients can not have anyone with them due to social distancing guidelines and our immunocompromised population.

## 2021-10-19 NOTE — Progress Notes (Signed)
Patient is taking Revlimid as prescribed.  She has not missed any doses and reports no side effects at this time.    Patient has been examined by Dr. Delton Coombes, and vital signs and labs have been reviewed. ANC, Creatinine, LFTs, hemoglobin, and platelets are within treatment parameters per M.D. - pt may proceed with treatment.  We will dose reduce Velcade to '1mg'$ /m2 d/t neuropathy per Dr. Delton Coombes.

## 2021-10-19 NOTE — Progress Notes (Signed)
Patient as las assessed by Dr. Delton Coombes, patient okay for treatment. Patient tolerated Velcade injection with no complaints voiced. Lab work reviewed. See MAR for details. Injection site clean and dry with no bruising or swelling noted. Patient stable during and after injection. Band aid applied.  Patient tolerated Daratumumab injection with no complaints voiced. See MAR for details. Lab reviewed. Injection site clean and dry with no bruising or swelling noted at site. Band aid applied. Vss with discharge and left in satisfactory condition with nos/s of distress noted.

## 2021-10-19 NOTE — Telephone Encounter (Signed)
Chart reviewed. Revlimid refilled per last office note with Dr. Katragadda.  

## 2021-10-19 NOTE — Patient Instructions (Signed)
[facility]  Discharge Instructions: Thank you for choosing [facility] to provide your oncology and hematology care.  If you have a lab appointment with the [facility], please come in thru the Main Entrance and check in at the main information desk.  Wear comfortable clothing and clothing appropriate for easy access to any Portacath or PICC line.   We strive to give you quality time with your provider. You may need to reschedule your appointment if you arrive late (15 or more minutes).  Arriving late affects you and other patients whose appointments are after yours.  Also, if you miss three or more appointments without notifying the office, you may be dismissed from the clinic at the provider's discretion.      For prescription refill requests, have your pharmacy contact our office and allow 72 hours for refills to be completed.    Today you received the following chemotherapy and/or immunotherapy agents Velcade and Daratumumab, return as scheduled.   To help prevent nausea and vomiting after your treatment, we encourage you to take your nausea medication as directed.  BELOW ARE SYMPTOMS THAT SHOULD BE REPORTED IMMEDIATELY: *FEVER GREATER THAN 100.4 F (38 C) OR HIGHER *CHILLS OR SWEATING *NAUSEA AND VOMITING THAT IS NOT CONTROLLED WITH YOUR NAUSEA MEDICATION *UNUSUAL SHORTNESS OF BREATH *UNUSUAL BRUISING OR BLEEDING *URINARY PROBLEMS (pain or burning when urinating, or frequent urination) *BOWEL PROBLEMS (unusual diarrhea, constipation, pain near the anus) TENDERNESS IN MOUTH AND THROAT WITH OR WITHOUT PRESENCE OF ULCERS (sore throat, sores in mouth, or a toothache) UNUSUAL RASH, SWELLING OR PAIN  UNUSUAL VAGINAL DISCHARGE OR ITCHING   Items with * indicate a potential emergency and should be followed up as soon as possible or go to the Emergency Department if any problems should occur.  Please show the CHEMOTHERAPY ALERT CARD or IMMUNOTHERAPY ALERT CARD at  check-in to the Emergency Department and triage nurse.  Should you have questions after your visit or need to cancel or reschedule your appointment, please contact [facility] [phone]  and follow the prompts.  Office hours are 8:00 a.m. to 4:30 p.m. Monday - Friday. Please note that voicemails left after 4:00 p.m. may not be returned until the following business day.  We are closed weekends and major holidays. You have access to a nurse at all times for urgent questions. Please call the main number to the clinic [phone] and follow the prompts.  For any non-urgent questions, you may also contact your provider using MyChart. We now offer e-Visits for anyone 29 and older to request care online for non-urgent symptoms. For details visit [email or web address].   Also download the MyChart app! Go to the app store, search "MyChart", open the app, select [facility], and log in with your MyChart username and password.  Due to Covid, a mask is required upon entering the hospital/clinic. If you do not have a mask, one will be given to you upon arrival. For doctor visits, patients may have 1 support person aged 72 or older with them. For treatment visits, patients cannot have anyone with them due to current Covid guidelines and our immunocompromised population.

## 2021-10-20 LAB — KAPPA/LAMBDA LIGHT CHAINS
Kappa free light chain: 14.5 mg/L (ref 3.3–19.4)
Kappa, lambda light chain ratio: 1.45 (ref 0.26–1.65)
Lambda free light chains: 10 mg/L (ref 5.7–26.3)

## 2021-10-20 LAB — BETA 2 MICROGLOBULIN, SERUM: Beta-2 Microglobulin: 2.1 mg/L (ref 0.6–2.4)

## 2021-10-21 LAB — PROTEIN ELECTROPHORESIS, SERUM
A/G Ratio: 1.9 — ABNORMAL HIGH (ref 0.7–1.7)
Albumin ELP: 3.7 g/dL (ref 2.9–4.4)
Alpha-1-Globulin: 0.3 g/dL (ref 0.0–0.4)
Alpha-2-Globulin: 0.6 g/dL (ref 0.4–1.0)
Beta Globulin: 0.7 g/dL (ref 0.7–1.3)
Gamma Globulin: 0.4 g/dL (ref 0.4–1.8)
Globulin, Total: 2 g/dL — ABNORMAL LOW (ref 2.2–3.9)
M-Spike, %: 0.2 g/dL — ABNORMAL HIGH
Total Protein ELP: 5.7 g/dL — ABNORMAL LOW (ref 6.0–8.5)

## 2021-10-22 ENCOUNTER — Inpatient Hospital Stay (HOSPITAL_COMMUNITY): Payer: Medicare Other | Attending: Hematology

## 2021-10-22 ENCOUNTER — Telehealth: Payer: BC Managed Care – PPO | Admitting: Licensed Clinical Social Worker

## 2021-10-22 ENCOUNTER — Ambulatory Visit (HOSPITAL_COMMUNITY): Payer: BC Managed Care – PPO

## 2021-10-22 ENCOUNTER — Telehealth: Payer: Self-pay

## 2021-10-22 ENCOUNTER — Ambulatory Visit (INDEPENDENT_AMBULATORY_CARE_PROVIDER_SITE_OTHER): Payer: BC Managed Care – PPO | Admitting: Licensed Clinical Social Worker

## 2021-10-22 VITALS — BP 103/55 | HR 84 | Temp 98.6°F | Resp 18 | Wt 168.6 lb

## 2021-10-22 DIAGNOSIS — Z5112 Encounter for antineoplastic immunotherapy: Secondary | ICD-10-CM | POA: Insufficient documentation

## 2021-10-22 DIAGNOSIS — K219 Gastro-esophageal reflux disease without esophagitis: Secondary | ICD-10-CM

## 2021-10-22 DIAGNOSIS — C9000 Multiple myeloma not having achieved remission: Secondary | ICD-10-CM | POA: Insufficient documentation

## 2021-10-22 DIAGNOSIS — Z79899 Other long term (current) drug therapy: Secondary | ICD-10-CM | POA: Insufficient documentation

## 2021-10-22 DIAGNOSIS — R5383 Other fatigue: Secondary | ICD-10-CM

## 2021-10-22 DIAGNOSIS — F419 Anxiety disorder, unspecified: Secondary | ICD-10-CM

## 2021-10-22 DIAGNOSIS — R251 Tremor, unspecified: Secondary | ICD-10-CM

## 2021-10-22 DIAGNOSIS — D472 Monoclonal gammopathy: Secondary | ICD-10-CM

## 2021-10-22 DIAGNOSIS — D508 Other iron deficiency anemias: Secondary | ICD-10-CM

## 2021-10-22 DIAGNOSIS — G8929 Other chronic pain: Secondary | ICD-10-CM

## 2021-10-22 MED ORDER — PROCHLORPERAZINE MALEATE 10 MG PO TABS
10.0000 mg | ORAL_TABLET | Freq: Once | ORAL | Status: AC
  Administered 2021-10-22: 10 mg via ORAL
  Filled 2021-10-22: qty 1

## 2021-10-22 MED ORDER — BORTEZOMIB CHEMO SQ INJECTION 3.5 MG (2.5MG/ML)
1.0000 mg/m2 | Freq: Once | INTRAMUSCULAR | Status: AC
  Administered 2021-10-22: 1.75 mg via SUBCUTANEOUS
  Filled 2021-10-22: qty 0.7

## 2021-10-22 NOTE — Telephone Encounter (Signed)
Patient returning call. Call back # [phone]

## 2021-10-22 NOTE — Patient Instructions (Signed)
[facility]  Discharge Instructions: Thank you for choosing [facility] to provide your oncology and hematology care.  If you have a lab appointment with the [facility], please come in thru the Main Entrance and check in at the main information desk.  Wear comfortable clothing and clothing appropriate for easy access to any Portacath or PICC line.   We strive to give you quality time with your provider. You may need to reschedule your appointment if you arrive late (15 or more minutes).  Arriving late affects you and other patients whose appointments are after yours.  Also, if you miss three or more appointments without notifying the office, you may be dismissed from the clinic at the provider's discretion.      For prescription refill requests, have your pharmacy contact our office and allow 72 hours for refills to be completed.    Today you received the following chemotherapy and/or immunotherapy agents Velcade      To help prevent nausea and vomiting after your treatment, we encourage you to take your nausea medication as directed.  BELOW ARE SYMPTOMS THAT SHOULD BE REPORTED IMMEDIATELY: *FEVER GREATER THAN 100.4 F (38 C) OR HIGHER *CHILLS OR SWEATING *NAUSEA AND VOMITING THAT IS NOT CONTROLLED WITH YOUR NAUSEA MEDICATION *UNUSUAL SHORTNESS OF BREATH *UNUSUAL BRUISING OR BLEEDING *URINARY PROBLEMS (pain or burning when urinating, or frequent urination) *BOWEL PROBLEMS (unusual diarrhea, constipation, pain near the anus) TENDERNESS IN MOUTH AND THROAT WITH OR WITHOUT PRESENCE OF ULCERS (sore throat, sores in mouth, or a toothache) UNUSUAL RASH, SWELLING OR PAIN  UNUSUAL VAGINAL DISCHARGE OR ITCHING   Items with * indicate a potential emergency and should be followed up as soon as possible or go to the Emergency Department if any problems should occur.  Please show the CHEMOTHERAPY ALERT CARD or IMMUNOTHERAPY ALERT CARD at check-in to the Emergency  Department and triage nurse.  Should you have questions after your visit or need to cancel or reschedule your appointment, please contact [facility] [phone]  and follow the prompts.  Office hours are 8:00 a.m. to 4:30 p.m. Monday - Friday. Please note that voicemails left after 4:00 p.m. may not be returned until the following business day.  We are closed weekends and major holidays. You have access to a nurse at all times for urgent questions. Please call the main number to the clinic [phone] and follow the prompts.  For any non-urgent questions, you may also contact your provider using MyChart. We now offer e-Visits for anyone 10 and older to request care online for non-urgent symptoms. For details visit [email or web address].   Also download the MyChart app! Go to the app store, search "MyChart", open the app, select [facility], and log in with your MyChart username and password.  Due to Covid, a mask is required upon entering the hospital/clinic. If you do not have a mask, one will be given to you upon arrival. For doctor visits, patients may have 1 support person aged 61 or older with them. For treatment visits, patients cannot have anyone with them due to current Covid guidelines and our immunocompromised population.

## 2021-10-22 NOTE — Chronic Care Management (AMB) (Signed)
Chronic Care Management    Clinical Social Work Note  10/22/2021 Name: Courtney Rogers MRN: [id number] DOB: 1950-08-21  Courtney Rogers is a 71 y.o. year old female who is a primary care patient of Renee Rival, FNP. The CCM team was consulted to assist the patient with chronic disease management and/or care coordination needs related to: [employer] .   Engaged with patient by telephone for follow up visit in response to provider referral for social work chronic care management and care coordination services.   Consent to Services:  The patient was given information about Chronic Care Management services, agreed to services, and gave verbal consent prior to initiation of services.  Please see initial visit note for detailed documentation.   Patient agreed to services and consent obtained.   Assessment: Review of patient past medical history, allergies, medications, and health status, including review of relevant consultants reports was performed today as part of a comprehensive evaluation and provision of chronic care management and care coordination services.     SDOH (Social Determinants of Health) assessments and interventions performed:  SDOH Interventions    Flowsheet Row Most Recent Value  SDOH Interventions   Physical Activity Interventions Other (Comments)  [walking challenges. uses a walker to help her walk]  Stress Interventions Provide Counseling  [client has stress related to managing medical condition]  Depression Interventions/Treatment  Medication, Counseling        Advanced Directives Status: See Vynca application for related entries.  CCM Care Plan  Allergies  Allergen Reactions   Amoxicillin-Pot Clavulanate Other (See Comments)   Biaxin [Clarithromycin] Other (See Comments)    Stomach problems    Erythromycin    Lisinopril Swelling    Outpatient Encounter Medications as of 10/22/2021  Medication Sig   acetaminophen (TYLENOL) 500 MG tablet  Take 1,000 mg by mouth 2 (two) times daily as needed for moderate pain or headache.   acyclovir (ZOVIRAX) 400 MG tablet Take 1 tablet (400 mg total) by mouth 2 (two) times daily.   albuterol (PROVENTIL HFA;VENTOLIN HFA) 108 (90 BASE) MCG/ACT inhaler Inhale 2 puffs into the lungs every 6 (six) hours as needed for shortness of breath.   amLODipine (NORVASC) 10 MG tablet Take 1 tablet (10 mg total) by mouth daily.   Ascorbic Acid 500 MG CHEW Chew by mouth.   aspirin EC 81 MG tablet Take 81 mg by mouth at bedtime.   B Complex Vitamins (VITAMIN B COMPLEX) TABS Take 1 tablet by mouth daily.   bortezomib SQ (VELCADE) 3.5 MG injection Inject into the skin once.   busPIRone (BUSPAR) 5 MG tablet Take 1 tablet (5 mg total) by mouth daily.   Calcium Carbonate-Vitamin D (CALCIUM 600+D PO) Take 1 tablet by mouth 2 (two) times daily.   chlorthalidone (HYGROTON) 25 MG tablet Take 25 mg by mouth daily.    cholecalciferol (VITAMIN D) 25 MCG (1000 UNIT) tablet Take 1,000 Units by mouth daily.   cyanocobalamin 1000 MCG tablet Take 1 tablet by mouth daily.   cyclobenzaprine (FLEXERIL) 10 MG tablet Take 0.5 tablets (5 mg total) by mouth 2 (two) times daily as needed for muscle spasms.   Daratumumab-Hyaluronidase-fihj (DARZALEX FASPRO [place]) Inject into the skin.   fluticasone (FLONASE) 50 MCG/ACT nasal spray USE 2 SPRAYS IN EACH NOSTRIL ONCE DAILY.   fluticasone-salmeterol (ADVAIR) 250-50 MCG/ACT AEPB Inhale 1 puff into the lungs 2 (two) times daily.   folic acid (FOLVITE) 1 MG tablet TAKE 1 TABLET BY MOUTH ONCE DAILY. (Patient  taking differently: Take 1 mg by mouth daily.)   gabapentin (NEURONTIN) 600 MG tablet Take 600 mg by mouth 2 (two) times daily.   lenalidomide (REVLIMID) 15 MG capsule Take 1 capsule (15 mg total) by mouth daily. 21 days on, 7 days off every 28 days   lidocaine (XYLOCAINE) 2 % solution TAKE 2 TEASPOONFULS BEFORE MEALS AND AT BEDTIME AS NEEDED MAY REPEAT EVERY 4 HOURS. (MAX OF 8 DOSES PER DAY)    Lidocaine HCl (XOLIDO) 2 % CREA Apply 1 application topically daily as needed (arthritisis).   losartan (COZAAR) 100 MG tablet Take 100 mg by mouth daily.   magnesium oxide (MAG-OX) 400 (240 Mg) MG tablet Take 1 tablet (400 mg total) by mouth 2 (two) times daily.   metoprolol tartrate (LOPRESSOR) 50 MG tablet Take 1 tablet (50 mg total) by mouth 2 (two) times daily.   montelukast (SINGULAIR) 10 MG tablet Take 10 mg by mouth daily.    MYRBETRIQ 25 MG TB24 tablet Take 25 mg by mouth daily.   nitroGLYCERIN (NITROSTAT) 0.4 MG SL tablet Place 0.4 mg under the tongue every 5 (five) minutes as needed for chest pain.   pantoprazole (PROTONIX) 40 MG tablet Take 40 mg by mouth daily.   potassium chloride (KLOR-CON M) 10 MEQ tablet Take 1 tablet (10 mEq total) by mouth 2 (two) times daily.   potassium chloride SA (KLOR-CON M) 20 MEQ tablet Take 1 tablet (20 mEq total) by mouth 3 (three) times daily.   primidone (MYSOLINE) 50 MG tablet Take 50 mg by mouth daily.   Probiotic Product (GNP PROBIOTIC COLON SUPPORT) CAPS Take 1 capsule by mouth daily.   sertraline (ZOLOFT) 100 MG tablet Take 200 mg by mouth daily.    simvastatin (ZOCOR) 40 MG tablet Take 40 mg by mouth at bedtime.   warfarin (COUMADIN) 3 MG tablet Take 3 mg by mouth daily.   No facility-administered encounter medications on file as of 10/22/2021.    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[facility]) 05/06/2021   Long term (current) use of anticoagulants 05/06/2021   Major  depressive disorder, single episode, unspecified 05/06/2021   Mild intermittent asthma 05/06/2021   Mixed hyperlipidemia 05/06/2021   Monoclonal gammopathy 05/06/2021   Obesity 05/06/2021   Personal history of pulmonary embolism 05/06/2021   Recurrent major depression in remission ([facility]) 05/06/2021   Severe recurrent major depression without psychotic features ([place]) 05/06/2021   Skin sensation disturbance 05/06/2021   Unspecified mononeuropathy of left upper limb 05/06/2021   Urinary incontinence 05/06/2021   Tremors of nervous system 04/13/2021   Left hip pain 04/13/2021   Allergic rhinitis 03/22/2021   Allergic sinusitis 03/22/2021   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   Abdominal pain 02/02/2021   Buttock pain 10/01/2020   Bilateral knee pain 07/15/2020   Immunization due 07/15/2020   Encounter for subsequent annual wellness visit (AWV) in Medicare patient 07/15/2020   Easy bruising 04/30/2020   Wedge compression fracture of first lumbar vertebra, subsequent encounter for fracture with routine healing  04/06/2020   Trapezius muscle spasm 02/27/2020   Body mass index (BMI) 28.0-28.9, adult 01/14/2020   DDD (degenerative disc disease), cervical 01/02/2020   Hoarseness of voice 11/27/2019   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 manage daily needs of client and her spouse. manage anxiety issues   Start Date: 07/30/2021  Expected End Date: 01/13/2022   This Visit's Progress: On track  Recent Progress: Not on track  Priority: Medium  Note:   Current Barriers:  Anxiety issues Mobility issues Caregiver stress issues Suicidal Ideation/Homicidal Ideation: No  Clinical Social Work Goal(s):  patient will work with SW monthly by telephone or in person to reduce or manage symptoms related to anxiety and anxiety issues Patient will talk with SW monthly about caregiver stress issues Patient will attend all scheduled medical appointments for client in next 30 days Patient will call RNCM as needed in next 30 days to discuss nursing needs of client  Interventions: 1:1 collaboration with Renee Rival, FNP regarding development and update of comprehensive plan of care as evidenced by provider attestation and co-signature Discussed with Alex Gardener her current needs Discussed with [place] the needs of her spouse.  She said her spouse has Dementia and it is challenging in caring for needs of her spouse Discussed client recent diagnosis of bone cancer. She said she goes weekly for needed injections and is participating as scheduled in chemotherapy.  She said she has support from her sister, who resides with her and her spouse. Jerilyn has support from her daughter who lives nearby. LCSW talked with client about her adjustment to diagnosis and adjusting to cancer treatments. Provided counseling support for client .Used Active Listening Techniques to allow client to discuss her feelings regarding this recent diagnoses. She said she has talked with her children about diagnosis. She has support from her family. Encouraged client to communicate as needed with RNCM to discuss nursing needs of client Encouraged client to call LCSW as needed for SW support Discussed mood of client. Desarie said she  had been a little sad since receiving diagnoses. But, she is talking with her children and talking with her sister about situation and trying to plan for  her upcoming treatments. She said that overall she thought her mood was stable. She is adjusting to her diagnosis and working with family support Reviewed appetite of client. Reviewed sleeping issues of client. Reviewed pain issues of client  Patient Self Care Activities:  Attends all scheduled provider appointments Performs ADL's independently  Patient Coping Strengths:  Family Friends  Patient Self Care Deficits:  Walking challenges (she uses a walker to help her walk) Caregiver stress issues Some anxiety issues  Patient Goals:  - spend time or talk with others at least 2 to 3 times per week - practice relaxation or meditation daily - keep a calendar with appointment dates  Follow Up Plan: LCSW to call client on 11/26/21 at 1:00  PM      Norva Riffle.Kendricks Reap MSW, [place] [facility] representative [facility] Care Management [phone]

## 2021-10-22 NOTE — Patient Instructions (Addendum)
Visit Information  Patient Goals:  Manage Anxiety issues. Manage Caregiver Stress issues  Time Frame:  Short Term Goal Priority; Medium  Progress;  On Track  Start Date:  07/23/21   Expected End Date:  01/13/22    Follow up Date:  11/26/21 at 1:00 PM   Manage Anxiety issues. Manage Caregiver Stress issues  Patient Self Care Activities:  Attends all scheduled provider appointments Performs ADL's independently  Patient Coping Strengths:  Family Friends  Patient Self Care Deficits:  Walking challenges Caregiver stress issues Sleeping challenges  Patient Goals:  - spend time or talk with others at least 2 to 3 times per week - practice relaxation or meditation daily - keep a calendar with appointment dates  Follow Up Plan: LCSW to call client on 11/26/21 at 1:00 PM   Norva Riffle.Leira Regino MSW, [place] Holiday representative [facility] Care Management [phone]

## 2021-10-22 NOTE — Progress Notes (Signed)
Patient presents today for Velcade injection per providers order.  Vital signs and labs within parameters for treatment.  Patient has no new complaints at this time.  Stable during administration without incident; injection site WNL; see MAR for injection details.  Patient tolerated procedure well and without incident.  No questions or complaints noted at this time.  Discharge from clinic ambulatory in stable condition.  Alert and oriented X 3.  Follow up with [facility] as scheduled.

## 2021-10-26 ENCOUNTER — Inpatient Hospital Stay (HOSPITAL_COMMUNITY): Payer: Medicare Other

## 2021-10-26 VITALS — BP 125/76 | HR 88 | Temp 98.4°F | Resp 16 | Wt 167.1 lb

## 2021-10-26 DIAGNOSIS — C9000 Multiple myeloma not having achieved remission: Secondary | ICD-10-CM

## 2021-10-26 DIAGNOSIS — Z5112 Encounter for antineoplastic immunotherapy: Secondary | ICD-10-CM | POA: Diagnosis not present

## 2021-10-26 LAB — COMPREHENSIVE METABOLIC PANEL
ALT: 16 U/L (ref 0–44)
AST: 18 U/L (ref 15–41)
Albumin: 3.9 g/dL (ref 3.5–5.0)
Alkaline Phosphatase: 77 U/L (ref 38–126)
Anion gap: 9 (ref 5–15)
BUN: 14 mg/dL (ref 8–23)
CO2: 29 mmol/L (ref 22–32)
Calcium: 8.1 mg/dL — ABNORMAL LOW (ref 8.9–10.3)
Chloride: 102 mmol/L (ref 98–111)
Creatinine, Ser: 0.66 mg/dL (ref 0.44–1.00)
GFR, Estimated: >60 mL/min (ref >60)
Glucose, Bld: 71 mg/dL (ref 70–99)
Potassium: 3.3 mmol/L — ABNORMAL LOW (ref 3.5–5.1)
Sodium: 140 mmol/L (ref 135–145)
Total Bilirubin: 0.5 mg/dL (ref 0.3–1.2)
Total Protein: 6 g/dL — ABNORMAL LOW (ref 6.5–8.1)

## 2021-10-26 LAB — CBC WITH DIFFERENTIAL/PLATELET
Abs Immature Granulocytes: 0.01 10*3/uL (ref 0.00–0.07)
Basophils Absolute: 0.1 10*3/uL (ref 0.0–0.1)
Basophils Relative: 1 %
Eosinophils Absolute: 0.6 10*3/uL — ABNORMAL HIGH (ref 0.0–0.5)
Eosinophils Relative: 14 %
HCT: 30.3 % — ABNORMAL LOW (ref 36.0–46.0)
Hemoglobin: 9.8 g/dL — ABNORMAL LOW (ref 12.0–15.0)
Immature Granulocytes: 0 %
Lymphocytes Relative: 13 %
Lymphs Abs: 0.6 10*3/uL — ABNORMAL LOW (ref 0.7–4.0)
MCH: 32.6 pg (ref 26.0–34.0)
MCHC: 32.3 g/dL (ref 30.0–36.0)
MCV: 100.7 fL — ABNORMAL HIGH (ref 80.0–100.0)
Monocytes Absolute: 0.8 10*3/uL (ref 0.1–1.0)
Monocytes Relative: 18 %
Neutro Abs: 2.2 10*3/uL (ref 1.7–7.7)
Neutrophils Relative %: 54 %
Platelets: 71 10*3/uL — ABNORMAL LOW (ref 150–400)
RBC: 3.01 MIL/uL — ABNORMAL LOW (ref 3.87–5.11)
RDW: 16.1 % — ABNORMAL HIGH (ref 11.5–15.5)
WBC: 4.2 10*3/uL (ref 4.0–10.5)
nRBC: 0 % (ref 0.0–0.2)

## 2021-10-26 LAB — MAGNESIUM: Magnesium: 1.9 mg/dL (ref 1.7–2.4)

## 2021-10-26 MED ORDER — POTASSIUM CHLORIDE CRYS ER 20 MEQ PO TBCR
40.0000 meq | EXTENDED_RELEASE_TABLET | Freq: Once | ORAL | Status: AC
  Administered 2021-10-26: 40 meq via ORAL
  Filled 2021-10-26: qty 2

## 2021-10-26 MED ORDER — DARATUMUMAB-HYALURONIDASE-FIHJ 1800-30000 MG-UT/15ML ~~LOC~~ SOLN
1800.0000 mg | Freq: Once | SUBCUTANEOUS | Status: AC
  Administered 2021-10-26: 1800 mg via SUBCUTANEOUS
  Filled 2021-10-26: qty 15

## 2021-10-26 MED ORDER — ACETAMINOPHEN 325 MG PO TABS
650.0000 mg | ORAL_TABLET | Freq: Once | ORAL | Status: AC
  Administered 2021-10-26: 650 mg via ORAL
  Filled 2021-10-26: qty 2

## 2021-10-26 MED ORDER — DEXAMETHASONE 4 MG PO TABS
20.0000 mg | ORAL_TABLET | Freq: Once | ORAL | Status: AC
  Administered 2021-10-26: 20 mg via ORAL
  Filled 2021-10-26: qty 5

## 2021-10-26 MED ORDER — DIPHENHYDRAMINE HCL 25 MG PO CAPS
25.0000 mg | ORAL_CAPSULE | Freq: Once | ORAL | Status: AC
  Administered 2021-10-26: 25 mg via ORAL
  Filled 2021-10-26: qty 1

## 2021-10-26 NOTE — Patient Instructions (Signed)
[facility]  Discharge Instructions: Thank you for choosing [facility] to provide your oncology and hematology care.  If you have a lab appointment with the [facility], please come in thru the Main Entrance and check in at the main information desk.  Wear comfortable clothing and clothing appropriate for easy access to any Portacath or PICC line.   We strive to give you quality time with your provider. You may need to reschedule your appointment if you arrive late (15 or more minutes).  Arriving late affects you and other patients whose appointments are after yours.  Also, if you miss three or more appointments without notifying the office, you may be dismissed from the clinic at the provider's discretion.      For prescription refill requests, have your pharmacy contact our office and allow 72 hours for refills to be completed.    Today you received the following chemotherapy and/or immunotherapy agents Daratumumab      To help prevent nausea and vomiting after your treatment, we encourage you to take your nausea medication as directed.  BELOW ARE SYMPTOMS THAT SHOULD BE REPORTED IMMEDIATELY: *FEVER GREATER THAN 100.4 F (38 C) OR HIGHER *CHILLS OR SWEATING *NAUSEA AND VOMITING THAT IS NOT CONTROLLED WITH YOUR NAUSEA MEDICATION *UNUSUAL SHORTNESS OF BREATH *UNUSUAL BRUISING OR BLEEDING *URINARY PROBLEMS (pain or burning when urinating, or frequent urination) *BOWEL PROBLEMS (unusual diarrhea, constipation, pain near the anus) TENDERNESS IN MOUTH AND THROAT WITH OR WITHOUT PRESENCE OF ULCERS (sore throat, sores in mouth, or a toothache) UNUSUAL RASH, SWELLING OR PAIN  UNUSUAL VAGINAL DISCHARGE OR ITCHING   Items with * indicate a potential emergency and should be followed up as soon as possible or go to the Emergency Department if any problems should occur.  Please show the CHEMOTHERAPY ALERT CARD or IMMUNOTHERAPY ALERT CARD at check-in to the Emergency  Department and triage nurse.  Should you have questions after your visit or need to cancel or reschedule your appointment, please contact [facility] [phone]  and follow the prompts.  Office hours are 8:00 a.m. to 4:30 p.m. Monday - Friday. Please note that voicemails left after 4:00 p.m. may not be returned until the following business day.  We are closed weekends and major holidays. You have access to a nurse at all times for urgent questions. Please call the main number to the clinic [phone] and follow the prompts.  For any non-urgent questions, you may also contact your provider using MyChart. We now offer e-Visits for anyone 31 and older to request care online for non-urgent symptoms. For details visit [email or web address].   Also download the MyChart app! Go to the app store, search "MyChart", open the app, select [facility], and log in with your MyChart username and password.  Due to Covid, a mask is required upon entering the hospital/clinic. If you do not have a mask, one will be given to you upon arrival. For doctor visits, patients may have 1 support person aged 55 or older with them. For treatment visits, patients cannot have anyone with them due to current Covid guidelines and our immunocompromised population.

## 2021-10-26 NOTE — Progress Notes (Signed)
Patient presents today for Daratumumab injection per providers order.  Vital signs within parameters for treatment.  Labs reviewed and platelets noted to be 71.  MD notified, message received okay to proceed with treatment.  Daratumumab given today per MD orders.  Stable during injection without adverse affects.  Vital signs stable.  No complaints at this time.  Discharge from clinic ambulatory in stable condition.  Alert and oriented X 3.  Follow up with [facility] as scheduled.

## 2021-11-02 ENCOUNTER — Inpatient Hospital Stay (HOSPITAL_COMMUNITY): Payer: Medicare Other

## 2021-11-02 ENCOUNTER — Encounter (HOSPITAL_COMMUNITY): Payer: Self-pay

## 2021-11-02 VITALS — BP 141/87 | HR 88 | Temp 97.9°F | Resp 18 | Wt 164.2 lb

## 2021-11-02 DIAGNOSIS — C9000 Multiple myeloma not having achieved remission: Secondary | ICD-10-CM

## 2021-11-02 DIAGNOSIS — Z5112 Encounter for antineoplastic immunotherapy: Secondary | ICD-10-CM | POA: Diagnosis not present

## 2021-11-02 LAB — COMPREHENSIVE METABOLIC PANEL
ALT: 16 U/L (ref 0–44)
AST: 18 U/L (ref 15–41)
Albumin: 4.1 g/dL (ref 3.5–5.0)
Alkaline Phosphatase: 60 U/L (ref 38–126)
Anion gap: 8 (ref 5–15)
BUN: 14 mg/dL (ref 8–23)
CO2: 26 mmol/L (ref 22–32)
Calcium: 7.6 mg/dL — ABNORMAL LOW (ref 8.9–10.3)
Chloride: 107 mmol/L (ref 98–111)
Creatinine, Ser: 0.79 mg/dL (ref 0.44–1.00)
GFR, Estimated: >60 mL/min (ref >60)
Glucose, Bld: 97 mg/dL (ref 70–99)
Potassium: 4 mmol/L (ref 3.5–5.1)
Sodium: 141 mmol/L (ref 135–145)
Total Bilirubin: 0.7 mg/dL (ref 0.3–1.2)
Total Protein: 6.5 g/dL (ref 6.5–8.1)

## 2021-11-02 LAB — CBC WITH DIFFERENTIAL/PLATELET
Abs Immature Granulocytes: 0.01 10*3/uL (ref 0.00–0.07)
Basophils Absolute: 0.1 10*3/uL (ref 0.0–0.1)
Basophils Relative: 2 %
Eosinophils Absolute: 0.2 10*3/uL (ref 0.0–0.5)
Eosinophils Relative: 6 %
HCT: 32.1 % — ABNORMAL LOW (ref 36.0–46.0)
Hemoglobin: 10.4 g/dL — ABNORMAL LOW (ref 12.0–15.0)
Immature Granulocytes: 0 %
Lymphocytes Relative: 19 %
Lymphs Abs: 0.7 10*3/uL (ref 0.7–4.0)
MCH: 32.7 pg (ref 26.0–34.0)
MCHC: 32.4 g/dL (ref 30.0–36.0)
MCV: 100.9 fL — ABNORMAL HIGH (ref 80.0–100.0)
Monocytes Absolute: 0.7 10*3/uL (ref 0.1–1.0)
Monocytes Relative: 21 %
Neutro Abs: 1.8 10*3/uL (ref 1.7–7.7)
Neutrophils Relative %: 52 %
Platelets: 149 10*3/uL — ABNORMAL LOW (ref 150–400)
RBC: 3.18 MIL/uL — ABNORMAL LOW (ref 3.87–5.11)
RDW: 16.7 % — ABNORMAL HIGH (ref 11.5–15.5)
WBC: 3.4 10*3/uL — ABNORMAL LOW (ref 4.0–10.5)
nRBC: 0 % (ref 0.0–0.2)

## 2021-11-02 LAB — MAGNESIUM: Magnesium: 1.8 mg/dL (ref 1.7–2.4)

## 2021-11-02 MED ORDER — BORTEZOMIB CHEMO SQ INJECTION 3.5 MG (2.5MG/ML)
1.0000 mg/m2 | Freq: Once | INTRAMUSCULAR | Status: AC
  Administered 2021-11-02: 1.75 mg via SUBCUTANEOUS
  Filled 2021-11-02: qty 0.7

## 2021-11-02 MED ORDER — ACETAMINOPHEN 325 MG PO TABS
650.0000 mg | ORAL_TABLET | Freq: Once | ORAL | Status: AC
  Administered 2021-11-02: 650 mg via ORAL
  Filled 2021-11-02: qty 2

## 2021-11-02 MED ORDER — DEXAMETHASONE 4 MG PO TABS
20.0000 mg | ORAL_TABLET | Freq: Once | ORAL | Status: AC
  Administered 2021-11-02: 20 mg via ORAL
  Filled 2021-11-02: qty 5

## 2021-11-02 MED ORDER — DIPHENHYDRAMINE HCL 25 MG PO CAPS
25.0000 mg | ORAL_CAPSULE | Freq: Once | ORAL | Status: AC
  Administered 2021-11-02: 25 mg via ORAL
  Filled 2021-11-02: qty 1

## 2021-11-02 MED ORDER — DARATUMUMAB-HYALURONIDASE-FIHJ 1800-30000 MG-UT/15ML ~~LOC~~ SOLN
1800.0000 mg | Freq: Once | SUBCUTANEOUS | Status: AC
  Administered 2021-11-02: 1800 mg via SUBCUTANEOUS
  Filled 2021-11-02: qty 15

## 2021-11-02 NOTE — Patient Instructions (Signed)
[facility]  Discharge Instructions: Thank you for choosing [facility] to provide your oncology and hematology care.  If you have a lab appointment with the [facility], please come in thru the Main Entrance and check in at the main information desk.  Wear comfortable clothing and clothing appropriate for easy access to any Portacath or PICC line.   We strive to give you quality time with your provider. You may need to reschedule your appointment if you arrive late (15 or more minutes).  Arriving late affects you and other patients whose appointments are after yours.  Also, if you miss three or more appointments without notifying the office, you may be dismissed from the clinic at the provider's discretion.      For prescription refill requests, have your pharmacy contact our office and allow 72 hours for refills to be completed.    Today you received the following chemotherapy and/or immunotherapy agents Velcade/Darzalex Faspro.  Bortezomib injection What is this medication? BORTEZOMIB (bor TEZ oh mib) targets proteins in cancer cells and stops the cancer cells from growing. It treats multiple myeloma and mantle cell lymphoma. This medicine may be used for other purposes; ask your health care provider or pharmacist if you have questions. COMMON BRAND NAME(S): Velcade What should I tell my care team before I take this medication? They need to know if you have any of these conditions: dehydration diabetes (high blood sugar) heart disease liver disease tingling of the fingers or toes or other nerve disorder an unusual or allergic reaction to bortezomib, mannitol, boron, other medicines, foods, dyes, or preservatives pregnant or trying to get pregnant breast-feeding How should I use this medication? This medicine is injected into a vein or under the skin. It is given by a health care provider in a hospital or clinic setting. Talk to your health care provider  about the use of this medicine in children. Special care may be needed. Overdosage: If you think you have taken too much of this medicine contact a poison control center or emergency room at once. NOTE: This medicine is only for you. Do not share this medicine with others. What if I miss a dose? Keep appointments for follow-up doses. It is important not to miss your dose. Call your health care provider if you are unable to keep an appointment. What may interact with this medication? This medicine may interact with the following medications: ketoconazole rifampin This list may not describe all possible interactions. Give your health care provider a list of all the medicines, herbs, non-prescription drugs, or dietary supplements you use. Also tell them if you smoke, drink alcohol, or use illegal drugs. Some items may interact with your medicine. What should I watch for while using this medication? Your condition will be monitored carefully while you are receiving this medicine. You may need blood work done while you are taking this medicine. You may get drowsy or dizzy. Do not drive, use machinery, or do anything that needs mental alertness until you know how this medicine affects you. Do not stand up or sit up quickly, especially if you are an older patient. This reduces the risk of dizzy or fainting spells This medicine may increase your risk of getting an infection. Call your health care provider for advice if you get a fever, chills, sore throat, or other symptoms of a cold or flu. Do not treat yourself. Try to avoid being around people who are sick. Check with your health care  provider if you have severe diarrhea, nausea, and vomiting, or if you sweat a lot. The loss of too much body fluid may make it dangerous for you to take this medicine. Do not become pregnant while taking this medicine or for 7 months after stopping it. Women should inform their health care provider if they wish to become  pregnant or think they might be pregnant. Men should not father a child while taking this medicine and for 4 months after stopping it. There is a potential for serious harm to an unborn child. Talk to your health care provider for more information. Do not breast-feed an infant while taking this medicine or for 2 months after stopping it. This medicine may make it more difficult to get pregnant or father a child. Talk to your health care provider if you are concerned about your fertility. What side effects may I notice from receiving this medication? Side effects that you should report to your doctor or health care professional as soon as possible: allergic reactions (skin rash; itching or hives; swelling of the face, lips, or tongue) bleeding (bloody or black, tarry stools; red or dark brown urine; spitting up blood or brown material that looks like coffee grounds; red spots on the skin; unusual bruising or bleeding from the eye, gums, or nose) blurred vision or changes in vision confusion constipation headache heart failure (trouble breathing; fast, irregular heartbeat; sudden weight gain; swelling of the ankles, feet, hands) infection (fever, chills, cough, sore throat, pain or trouble passing urine) lack or loss of appetite liver injury (dark yellow or brown urine; general ill feeling or flu-like symptoms; loss of appetite, right upper belly pain; yellowing of the eyes or skin) low blood pressure (dizziness; feeling faint or lightheaded, falls; unusually weak or tired) muscle cramps pain, redness, or irritation at site where injected pain, tingling, numbness in the hands or feet seizures trouble breathing unusual bruising or bleeding Side effects that usually do not require medical attention (report to your doctor or health care professional if they continue or are bothersome): diarrhea nausea stomach pain trouble sleeping vomiting This list may not describe all possible side effects.  Call your doctor for medical advice about side effects. You may report side effects to FDA at 1-800-FDA-1088. Where should I keep my medication? This medicine is given in a hospital or clinic. It will not be stored at home. NOTE: This sheet is a summary. It may not cover all possible information. If you have questions about this medicine, talk to your doctor, pharmacist, or health care provider.  2023 Elsevier/Gold Standard (2020-04-30 00:00:00) Daratumumab; Hyaluronidase Injection What is this medication? DARATUMUMAB; HYALURONIDASE (dar a toom ue mab / hye al ur ON i dase) is a monoclonal antibody. Hyaluronidase is used to improve the effects of daratumumab. It treats certain types of cancer. Some of the cancers treated are multiple myeloma and light-chain amyloidosis. This medicine may be used for other purposes; ask your health care provider or pharmacist if you have questions. COMMON BRAND NAME(S): DARZALEX FASPRO What should I tell my care team before I take this medication? They need to know if you have any of these conditions: heart disease infection especially a viral infection such as chickenpox, cold sores, herpes, or hepatitis B lung or breathing disease an unusual or allergic reaction to daratumumab, hyaluronidase, other medicines, foods, dyes, or preservatives pregnant or trying to get pregnant breast-feeding How should I use this medication? This medicine is for injection under the skin. It is  given by a health care professional in a hospital or clinic setting. Talk to your pediatrician regarding the use of this medicine in children. Special care may be needed. Overdosage: If you think you have taken too much of this medicine contact a poison control center or emergency room at once. NOTE: This medicine is only for you. Do not share this medicine with others. What if I miss a dose? Keep appointments for follow-up doses as directed. It is important not to miss your dose. Call  your doctor or health care professional if you are unable to keep an appointment. What may interact with this medication? Interactions have not been studied. This list may not describe all possible interactions. Give your health care provider a list of all the medicines, herbs, non-prescription drugs, or dietary supplements you use. Also tell them if you smoke, drink alcohol, or use illegal drugs. Some items may interact with your medicine. What should I watch for while using this medication? Your condition will be monitored carefully while you are receiving this medicine. This medicine can cause serious allergic reactions. To reduce your risk, your health care provider may give you other medicine to take before receiving this one. Be sure to follow the directions from your health care provider. This medicine can affect the results of blood tests to match your blood type. These changes can last for up to 6 months after the final dose. Your healthcare provider will do blood tests to match your blood type before you start treatment. Tell all of your healthcare providers that you are being treated with this medicine before receiving a blood transfusion. This medicine can affect the results of some tests used to determine treatment response; extra tests may be needed to evaluate response. Do not become pregnant while taking this medicine or for 3 months after stopping it. Women should inform their health care provider if they wish to become pregnant or think they might be pregnant. There is a potential for serious side effects to an unborn child. Talk to your health care provider for more information. Do not breast-feed an infant while taking this medicine. What side effects may I notice from receiving this medication? Side effects that you should report to your care team as soon as possible: Allergic reactions--skin rash, itching or hives, swelling of the face, lips, or tongue Blood clot--chest pain,  shortness of breath, pain, swelling or warmth in the leg Blurred vision Fast, irregular heartbeat Infection--fever, chills, cough, sore throat, pain or trouble passing urine Injection reactions--dizziness, fast heartbeat, feeling faint or lightheaded, falls, headache, increase in blood pressure, nausea, vomiting, or wheezing or trouble breathing with loud or whistling sounds Low red blood cell counts--trouble breathing, feeling faint, lightheaded or falls, unusually weak or tired Unusual bleeding or bruising Side effects that usually do not require medical attention (report these to your care team if they continue or are bothersome): Back pain Constipation Diarrhea Pain, tingling, numbness in the hands or feet Pain, redness, or irritation at site where injected Muscle cramp or pain Swelling of the ankles, feet, hands Tiredness Trouble sleeping This list may not describe all possible side effects. Call your doctor for medical advice about side effects. You may report side effects to FDA at 1-800-FDA-1088. Where should I keep my medication? This drug is given in a hospital or clinic and will not be stored at home. NOTE: This sheet is a summary. It may not cover all possible information. If you have questions about this medicine, talk  to your doctor, pharmacist, or health care provider.  2023 Elsevier/Gold Standard (2021-04-09 00:00:00)       To help prevent nausea and vomiting after your treatment, we encourage you to take your nausea medication as directed.  BELOW ARE SYMPTOMS THAT SHOULD BE REPORTED IMMEDIATELY: *FEVER GREATER THAN 100.4 F (38 C) OR HIGHER *CHILLS OR SWEATING *NAUSEA AND VOMITING THAT IS NOT CONTROLLED WITH YOUR NAUSEA MEDICATION *UNUSUAL SHORTNESS OF BREATH *UNUSUAL BRUISING OR BLEEDING *URINARY PROBLEMS (pain or burning when urinating, or frequent urination) *BOWEL PROBLEMS (unusual diarrhea, constipation, pain near the anus) TENDERNESS IN MOUTH AND THROAT WITH  OR WITHOUT PRESENCE OF ULCERS (sore throat, sores in mouth, or a toothache) UNUSUAL RASH, SWELLING OR PAIN  UNUSUAL VAGINAL DISCHARGE OR ITCHING   Items with * indicate a potential emergency and should be followed up as soon as possible or go to the Emergency Department if any problems should occur.  Please show the CHEMOTHERAPY ALERT CARD or IMMUNOTHERAPY ALERT CARD at check-in to the Emergency Department and triage nurse.  Should you have questions after your visit or need to cancel or reschedule your appointment, please contact [facility] [phone]  and follow the prompts.  Office hours are 8:00 a.m. to 4:30 p.m. Monday - Friday. Please note that voicemails left after 4:00 p.m. may not be returned until the following business day.  We are closed weekends and major holidays. You have access to a nurse at all times for urgent questions. Please call the main number to the clinic [phone] and follow the prompts.  For any non-urgent questions, you may also contact your provider using MyChart. We now offer e-Visits for anyone 65 and older to request care online for non-urgent symptoms. For details visit [email or web address].   Also download the MyChart app! Go to the app store, search "MyChart", open the app, select [facility], and log in with your MyChart username and password.  Due to Covid, a mask is required upon entering the hospital/clinic. If you do not have a mask, one will be given to you upon arrival. For doctor visits, patients may have 1 support person aged 8 or older with them. For treatment visits, patients cannot have anyone with them due to current Covid guidelines and our immunocompromised population.

## 2021-11-02 NOTE — Progress Notes (Signed)
Patient presents today for treatment D1 Velcade, Darzalex Faspro. Vital signs within parameters for treatment. Labs within parameters for treatment. Patient has no complaints of any changes since her last treatment. Calcium 7.6 today. Patient states she is taking calcium supplements twice daily.   Treatment given today per MD orders. Tolerated without adverse affects. Vital signs stable. No complaints at this time. Discharged from clinic ambulatory in stable condition. Alert and oriented x 3. F/U with [facility] as scheduled.

## 2021-11-05 ENCOUNTER — Inpatient Hospital Stay (HOSPITAL_COMMUNITY): Payer: Medicare Other

## 2021-11-05 ENCOUNTER — Encounter (HOSPITAL_COMMUNITY): Payer: Self-pay

## 2021-11-05 VITALS — BP 116/59 | HR 84 | Temp 98.0°F | Resp 16 | Wt 164.2 lb

## 2021-11-05 DIAGNOSIS — C9000 Multiple myeloma not having achieved remission: Secondary | ICD-10-CM

## 2021-11-05 DIAGNOSIS — Z5112 Encounter for antineoplastic immunotherapy: Secondary | ICD-10-CM | POA: Diagnosis not present

## 2021-11-05 MED ORDER — PROCHLORPERAZINE MALEATE 10 MG PO TABS
10.0000 mg | ORAL_TABLET | Freq: Once | ORAL | Status: AC
  Administered 2021-11-05: 10 mg via ORAL
  Filled 2021-11-05: qty 1

## 2021-11-05 MED ORDER — BORTEZOMIB CHEMO SQ INJECTION 3.5 MG (2.5MG/ML)
1.0000 mg/m2 | Freq: Once | INTRAMUSCULAR | Status: AC
  Administered 2021-11-05: 1.75 mg via SUBCUTANEOUS
  Filled 2021-11-05: qty 0.7

## 2021-11-05 NOTE — Patient Instructions (Signed)
[facility]  Discharge Instructions: Thank you for choosing [facility] to provide your oncology and hematology care.  If you have a lab appointment with the [facility], please come in thru the Main Entrance and check in at the main information desk.  Wear comfortable clothing and clothing appropriate for easy access to any Portacath or PICC line.   We strive to give you quality time with your provider. You may need to reschedule your appointment if you arrive late (15 or more minutes).  Arriving late affects you and other patients whose appointments are after yours.  Also, if you miss three or more appointments without notifying the office, you may be dismissed from the clinic at the provider's discretion.      For prescription refill requests, have your pharmacy contact our office and allow 72 hours for refills to be completed.    Today you received the following chemotherapy and/or immunotherapy agents Velcade, return as scheduled.  Bortezomib injection What is this medication? BORTEZOMIB (bor TEZ oh mib) targets proteins in cancer cells and stops the cancer cells from growing. It treats multiple myeloma and mantle cell lymphoma. This medicine may be used for other purposes; ask your health care provider or pharmacist if you have questions. COMMON BRAND NAME(S): Velcade What should I tell my care team before I take this medication? They need to know if you have any of these conditions: dehydration diabetes (high blood sugar) heart disease liver disease tingling of the fingers or toes or other nerve disorder an unusual or allergic reaction to bortezomib, mannitol, boron, other medicines, foods, dyes, or preservatives pregnant or trying to get pregnant breast-feeding How should I use this medication? This medicine is injected into a vein or under the skin. It is given by a health care provider in a hospital or clinic setting. Talk to your health care  provider about the use of this medicine in children. Special care may be needed. Overdosage: If you think you have taken too much of this medicine contact a poison control center or emergency room at once. NOTE: This medicine is only for you. Do not share this medicine with others. What if I miss a dose? Keep appointments for follow-up doses. It is important not to miss your dose. Call your health care provider if you are unable to keep an appointment. What may interact with this medication? This medicine may interact with the following medications: ketoconazole rifampin This list may not describe all possible interactions. Give your health care provider a list of all the medicines, herbs, non-prescription drugs, or dietary supplements you use. Also tell them if you smoke, drink alcohol, or use illegal drugs. Some items may interact with your medicine. What should I watch for while using this medication? Your condition will be monitored carefully while you are receiving this medicine. You may need blood work done while you are taking this medicine. You may get drowsy or dizzy. Do not drive, use machinery, or do anything that needs mental alertness until you know how this medicine affects you. Do not stand up or sit up quickly, especially if you are an older patient. This reduces the risk of dizzy or fainting spells This medicine may increase your risk of getting an infection. Call your health care provider for advice if you get a fever, chills, sore throat, or other symptoms of a cold or flu. Do not treat yourself. Try to avoid being around people who are sick. Check with your  health care provider if you have severe diarrhea, nausea, and vomiting, or if you sweat a lot. The loss of too much body fluid may make it dangerous for you to take this medicine. Do not become pregnant while taking this medicine or for 7 months after stopping it. Women should inform their health care provider if they wish to  become pregnant or think they might be pregnant. Men should not father a child while taking this medicine and for 4 months after stopping it. There is a potential for serious harm to an unborn child. Talk to your health care provider for more information. Do not breast-feed an infant while taking this medicine or for 2 months after stopping it. This medicine may make it more difficult to get pregnant or father a child. Talk to your health care provider if you are concerned about your fertility. What side effects may I notice from receiving this medication? Side effects that you should report to your doctor or health care professional as soon as possible: allergic reactions (skin rash; itching or hives; swelling of the face, lips, or tongue) bleeding (bloody or black, tarry stools; red or dark brown urine; spitting up blood or brown material that looks like coffee grounds; red spots on the skin; unusual bruising or bleeding from the eye, gums, or nose) blurred vision or changes in vision confusion constipation headache heart failure (trouble breathing; fast, irregular heartbeat; sudden weight gain; swelling of the ankles, feet, hands) infection (fever, chills, cough, sore throat, pain or trouble passing urine) lack or loss of appetite liver injury (dark yellow or brown urine; general ill feeling or flu-like symptoms; loss of appetite, right upper belly pain; yellowing of the eyes or skin) low blood pressure (dizziness; feeling faint or lightheaded, falls; unusually weak or tired) muscle cramps pain, redness, or irritation at site where injected pain, tingling, numbness in the hands or feet seizures trouble breathing unusual bruising or bleeding Side effects that usually do not require medical attention (report to your doctor or health care professional if they continue or are bothersome): diarrhea nausea stomach pain trouble sleeping vomiting This list may not describe all possible side  effects. Call your doctor for medical advice about side effects. You may report side effects to FDA at 1-800-FDA-1088. Where should I keep my medication? This medicine is given in a hospital or clinic. It will not be stored at home. NOTE: This sheet is a summary. It may not cover all possible information. If you have questions about this medicine, talk to your doctor, pharmacist, or health care provider.  2023 Elsevier/Gold Standard (2020-04-30 00:00:00)    To help prevent nausea and vomiting after your treatment, we encourage you to take your nausea medication as directed.  BELOW ARE SYMPTOMS THAT SHOULD BE REPORTED IMMEDIATELY: *FEVER GREATER THAN 100.4 F (38 C) OR HIGHER *CHILLS OR SWEATING *NAUSEA AND VOMITING THAT IS NOT CONTROLLED WITH YOUR NAUSEA MEDICATION *UNUSUAL SHORTNESS OF BREATH *UNUSUAL BRUISING OR BLEEDING *URINARY PROBLEMS (pain or burning when urinating, or frequent urination) *BOWEL PROBLEMS (unusual diarrhea, constipation, pain near the anus) TENDERNESS IN MOUTH AND THROAT WITH OR WITHOUT PRESENCE OF ULCERS (sore throat, sores in mouth, or a toothache) UNUSUAL RASH, SWELLING OR PAIN  UNUSUAL VAGINAL DISCHARGE OR ITCHING   Items with * indicate a potential emergency and should be followed up as soon as possible or go to the Emergency Department if any problems should occur.  Please show the CHEMOTHERAPY ALERT CARD or IMMUNOTHERAPY ALERT CARD at check-in  to the Emergency Department and triage nurse.  Should you have questions after your visit or need to cancel or reschedule your appointment, please contact [facility] [phone]  and follow the prompts.  Office hours are 8:00 a.m. to 4:30 p.m. Monday - Friday. Please note that voicemails left after 4:00 p.m. may not be returned until the following business day.  We are closed weekends and major holidays. You have access to a nurse at all times for urgent questions. Please call the main number to the clinic  [phone] and follow the prompts.  For any non-urgent questions, you may also contact your provider using MyChart. We now offer e-Visits for anyone 57 and older to request care online for non-urgent symptoms. For details visit [email or web address].   Also download the MyChart app! Go to the app store, search "MyChart", open the app, select [facility], and log in with your MyChart username and password.  Masks are optional in the cancer centers. If you would like for your care team to wear a mask while they are taking care of you, please let them know. For doctor visits, patients may have with them one support person who is at least 73 years old. At this time, visitors are not allowed in the infusion area.

## 2021-11-05 NOTE — Progress Notes (Signed)
Patient tolerated Velcade injection with no complaints voiced. Lab work reviewed. See MAR for details. Injection site clean and dry with no bruising or swelling noted. Patient stable during and after injection. Band aid applied. VSS. Patient left in satisfactory condition with no s/s of distress noted. 

## 2021-11-09 ENCOUNTER — Inpatient Hospital Stay (HOSPITAL_COMMUNITY): Payer: Medicare Other

## 2021-11-09 ENCOUNTER — Telehealth: Payer: Self-pay | Admitting: Orthopedic Surgery

## 2021-11-09 VITALS — BP 128/78 | HR 99 | Temp 97.6°F | Resp 20

## 2021-11-09 DIAGNOSIS — C9000 Multiple myeloma not having achieved remission: Secondary | ICD-10-CM

## 2021-11-09 DIAGNOSIS — Z5112 Encounter for antineoplastic immunotherapy: Secondary | ICD-10-CM | POA: Diagnosis not present

## 2021-11-09 LAB — COMPREHENSIVE METABOLIC PANEL
ALT: 14 U/L (ref 0–44)
AST: 19 U/L (ref 15–41)
Albumin: 3.9 g/dL (ref 3.5–5.0)
Alkaline Phosphatase: 64 U/L (ref 38–126)
Anion gap: 6 (ref 5–15)
BUN: 13 mg/dL (ref 8–23)
CO2: 27 mmol/L (ref 22–32)
Calcium: 8.9 mg/dL (ref 8.9–10.3)
Chloride: 105 mmol/L (ref 98–111)
Creatinine, Ser: 0.64 mg/dL (ref 0.44–1.00)
GFR, Estimated: >60 mL/min (ref >60)
Glucose, Bld: 76 mg/dL (ref 70–99)
Potassium: 3.3 mmol/L — ABNORMAL LOW (ref 3.5–5.1)
Sodium: 138 mmol/L (ref 135–145)
Total Bilirubin: 0.4 mg/dL (ref 0.3–1.2)
Total Protein: 6.3 g/dL — ABNORMAL LOW (ref 6.5–8.1)

## 2021-11-09 LAB — CBC WITH DIFFERENTIAL/PLATELET
Abs Immature Granulocytes: 0.01 10*3/uL (ref 0.00–0.07)
Basophils Absolute: 0.1 10*3/uL (ref 0.0–0.1)
Basophils Relative: 4 %
Eosinophils Absolute: 0.4 10*3/uL (ref 0.0–0.5)
Eosinophils Relative: 11 %
HCT: 33.1 % — ABNORMAL LOW (ref 36.0–46.0)
Hemoglobin: 10.8 g/dL — ABNORMAL LOW (ref 12.0–15.0)
Immature Granulocytes: 0 %
Lymphocytes Relative: 25 %
Lymphs Abs: 0.8 10*3/uL (ref 0.7–4.0)
MCH: 32.9 pg (ref 26.0–34.0)
MCHC: 32.6 g/dL (ref 30.0–36.0)
MCV: 100.9 fL — ABNORMAL HIGH (ref 80.0–100.0)
Monocytes Absolute: 0.5 10*3/uL (ref 0.1–1.0)
Monocytes Relative: 14 %
Neutro Abs: 1.5 10*3/uL — ABNORMAL LOW (ref 1.7–7.7)
Neutrophils Relative %: 46 %
Platelets: 142 10*3/uL — ABNORMAL LOW (ref 150–400)
RBC: 3.28 MIL/uL — ABNORMAL LOW (ref 3.87–5.11)
RDW: 16.9 % — ABNORMAL HIGH (ref 11.5–15.5)
WBC: 3.3 10*3/uL — ABNORMAL LOW (ref 4.0–10.5)
nRBC: 0 % (ref 0.0–0.2)

## 2021-11-09 LAB — LACTATE DEHYDROGENASE: LDH: 113 U/L (ref 98–192)

## 2021-11-09 LAB — MAGNESIUM: Magnesium: 1.7 mg/dL (ref 1.7–2.4)

## 2021-11-09 MED ORDER — DIPHENHYDRAMINE HCL 25 MG PO CAPS
25.0000 mg | ORAL_CAPSULE | Freq: Once | ORAL | Status: AC
  Administered 2021-11-09: 25 mg via ORAL
  Filled 2021-11-09: qty 1

## 2021-11-09 MED ORDER — DARATUMUMAB-HYALURONIDASE-FIHJ 1800-30000 MG-UT/15ML ~~LOC~~ SOLN
1800.0000 mg | Freq: Once | SUBCUTANEOUS | Status: AC
  Administered 2021-11-09: 1800 mg via SUBCUTANEOUS
  Filled 2021-11-09: qty 15

## 2021-11-09 MED ORDER — DEXAMETHASONE 4 MG PO TABS
20.0000 mg | ORAL_TABLET | Freq: Once | ORAL | Status: AC
  Administered 2021-11-09: 20 mg via ORAL
  Filled 2021-11-09: qty 5

## 2021-11-09 MED ORDER — ACETAMINOPHEN 325 MG PO TABS
650.0000 mg | ORAL_TABLET | Freq: Once | ORAL | Status: AC
  Administered 2021-11-09: 650 mg via ORAL
  Filled 2021-11-09: qty 2

## 2021-11-09 MED ORDER — BORTEZOMIB CHEMO SQ INJECTION 3.5 MG (2.5MG/ML)
1.0000 mg/m2 | Freq: Once | INTRAMUSCULAR | Status: AC
  Administered 2021-11-09: 1.75 mg via SUBCUTANEOUS
  Filled 2021-11-09: qty 0.7

## 2021-11-09 NOTE — Telephone Encounter (Signed)
Patient came to office to inquire about scheduling appointment with Dr Aline Brochure for her knee, which has been giving way. Discussed next available appointment and in process, patient relayed she has been seen recently by another orthopaedic surgeon, Dr Chauncey Reading. Discussed 2nd opinion protocol, and gave patient a list of required information needed including Dr's notes, imaging reports, CD. Patient will contact the office and try to obtain. Patient also signed a release form here in case needed. Aware all records and information would need to be reviewed by Dr and approved to schedule. Pending.

## 2021-11-09 NOTE — Patient Instructions (Signed)
[place]  Discharge Instructions: Thank you for choosing [facility] to provide your oncology and hematology care.  If you have a lab appointment with the [facility], please come in thru the Main Entrance and check in at the main information desk.  Wear comfortable clothing and clothing appropriate for easy access to any Portacath or PICC line.   We strive to give you quality time with your provider. You may need to reschedule your appointment if you arrive late (15 or more minutes).  Arriving late affects you and other patients whose appointments are after yours.  Also, if you miss three or more appointments without notifying the office, you may be dismissed from the clinic at the provider's discretion.      For prescription refill requests, have your pharmacy contact our office and allow 72 hours for refills to be completed.    Today you received the following chemotherapy and/or immunotherapy agents Dara/Velcade      To help prevent nausea and vomiting after your treatment, we encourage you to take your nausea medication as directed.  BELOW ARE SYMPTOMS THAT SHOULD BE REPORTED IMMEDIATELY: *FEVER GREATER THAN 100.4 F (38 C) OR HIGHER *CHILLS OR SWEATING *NAUSEA AND VOMITING THAT IS NOT CONTROLLED WITH YOUR NAUSEA MEDICATION *UNUSUAL SHORTNESS OF BREATH *UNUSUAL BRUISING OR BLEEDING *URINARY PROBLEMS (pain or burning when urinating, or frequent urination) *BOWEL PROBLEMS (unusual diarrhea, constipation, pain near the anus) TENDERNESS IN MOUTH AND THROAT WITH OR WITHOUT PRESENCE OF ULCERS (sore throat, sores in mouth, or a toothache) UNUSUAL RASH, SWELLING OR PAIN  UNUSUAL VAGINAL DISCHARGE OR ITCHING   Items with * indicate a potential emergency and should be followed up as soon as possible or go to the Emergency Department if any problems should occur.  Please show the CHEMOTHERAPY ALERT CARD or IMMUNOTHERAPY ALERT CARD at check-in to the Emergency  Department and triage nurse.  Should you have questions after your visit or need to cancel or reschedule your appointment, please contact [facility] [phone]  and follow the prompts.  Office hours are 8:00 a.m. to 4:30 p.m. Monday - Friday. Please note that voicemails left after 4:00 p.m. may not be returned until the following business day.  We are closed weekends and major holidays. You have access to a nurse at all times for urgent questions. Please call the main number to the clinic [phone] and follow the prompts.  For any non-urgent questions, you may also contact your provider using MyChart. We now offer e-Visits for anyone 27 and older to request care online for non-urgent symptoms. For details visit [email or web address].   Also download the MyChart app! Go to the app store, search "MyChart", open the app, select [facility], and log in with your MyChart username and password.  Masks are optional in the cancer centers. If you would like for your care team to wear a mask while they are taking care of you, please let them know. For doctor visits, patients may have with them one support person who is at least 73 years old. At this time, visitors are not allowed in the infusion area.  Daratumumab injection What is this medication? DARATUMUMAB (dar a toom ue mab) is a monoclonal antibody. It is used to treat multiple myeloma. This medicine may be used for other purposes; ask your health care provider or pharmacist if you have questions. COMMON BRAND NAME(S): DARZALEX What should I tell my care team before I take this medication? They  need to know if you have any of these conditions: hereditary fructose intolerance infection (especially a virus infection such as chickenpox, herpes, or hepatitis B virus) lung or breathing disease (asthma, COPD) an unusual or allergic reaction to daratumumab, sorbitol, other medicines, foods, dyes, or preservatives pregnant or trying to  get pregnant breast-feeding How should I use this medication? This medicine is for infusion into a vein. It is given by a health care professional in a hospital or clinic setting. Talk to your pediatrician regarding the use of this medicine in children. Special care may be needed. Overdosage: If you think you have taken too much of this medicine contact a poison control center or emergency room at once. NOTE: This medicine is only for you. Do not share this medicine with others. What if I miss a dose? Keep appointments for follow-up doses as directed. It is important not to miss your dose. Call your doctor or health care professional if you are unable to keep an appointment. What may interact with this medication? Interactions have not been studied. This list may not describe all possible interactions. Give your health care provider a list of all the medicines, herbs, non-prescription drugs, or dietary supplements you use. Also tell them if you smoke, drink alcohol, or use illegal drugs. Some items may interact with your medicine. What should I watch for while using this medication? Your condition will be monitored carefully while you are receiving this medicine. This medicine can cause serious allergic reactions. To reduce your risk, your health care provider may give you other medicine to take before receiving this one. Be sure to follow the directions from your health care provider. This medicine can affect the results of blood tests to match your blood type. These changes can last for up to 6 months after the final dose. Your healthcare provider will do blood tests to match your blood type before you start treatment. Tell all of your healthcare providers that you are being treated with this medicine before receiving a blood transfusion. This medicine can affect the results of some tests used to determine treatment response; extra tests may be needed to evaluate response. Do not become pregnant  while taking this medicine or for 3 months after stopping it. Women should inform their health care provider if they wish to become pregnant or think they might be pregnant. There is a potential for serious side effects to an unborn child. Talk to your health care provider for more information. Do not breast-feed an infant while taking this medicine. What side effects may I notice from receiving this medication? Side effects that you should report to your care team as soon as possible: Allergic reactions--skin rash, itching or hives, swelling of the face, lips, or tongue Blurred vision Infection--fever, chills, cough, sore throat, pain or trouble passing urine Infusion reactions--dizziness, fast heartbeat, feeling faint or lightheaded, falls, headache, increase in blood pressure, nausea, vomiting, or wheezing or trouble breathing with loud or whistling sounds Unusual bleeding or bruising Side effects that usually do not require medical attention (report to your care team if they continue or are bothersome): Constipation Diarrhea Pain, tingling, numbness in the hands or feet Swelling of the ankles, feet, hands Tiredness This list may not describe all possible side effects. Call your doctor for medical advice about side effects. You may report side effects to FDA at 1-800-FDA-1088. Where should I keep my medication? This drug is given in a hospital or clinic and will not be stored at home.  NOTE: This sheet is a summary. It may not cover all possible information. If you have questions about this medicine, talk to your doctor, pharmacist, or health care provider.  2023 Elsevier/Gold Standard (2020-10-15 00:00:00) Bortezomib injection What is this medication? BORTEZOMIB (bor TEZ oh mib) targets proteins in cancer cells and stops the cancer cells from growing. It treats multiple myeloma and mantle cell lymphoma. This medicine may be used for other purposes; ask your health care provider or  pharmacist if you have questions. COMMON BRAND NAME(S): Velcade What should I tell my care team before I take this medication? They need to know if you have any of these conditions: dehydration diabetes (high blood sugar) heart disease liver disease tingling of the fingers or toes or other nerve disorder an unusual or allergic reaction to bortezomib, mannitol, boron, other medicines, foods, dyes, or preservatives pregnant or trying to get pregnant breast-feeding How should I use this medication? This medicine is injected into a vein or under the skin. It is given by a health care provider in a hospital or clinic setting. Talk to your health care provider about the use of this medicine in children. Special care may be needed. Overdosage: If you think you have taken too much of this medicine contact a poison control center or emergency room at once. NOTE: This medicine is only for you. Do not share this medicine with others. What if I miss a dose? Keep appointments for follow-up doses. It is important not to miss your dose. Call your health care provider if you are unable to keep an appointment. What may interact with this medication? This medicine may interact with the following medications: ketoconazole rifampin This list may not describe all possible interactions. Give your health care provider a list of all the medicines, herbs, non-prescription drugs, or dietary supplements you use. Also tell them if you smoke, drink alcohol, or use illegal drugs. Some items may interact with your medicine. What should I watch for while using this medication? Your condition will be monitored carefully while you are receiving this medicine. You may need blood work done while you are taking this medicine. You may get drowsy or dizzy. Do not drive, use machinery, or do anything that needs mental alertness until you know how this medicine affects you. Do not stand up or sit up quickly, especially if you are  an older patient. This reduces the risk of dizzy or fainting spells This medicine may increase your risk of getting an infection. Call your health care provider for advice if you get a fever, chills, sore throat, or other symptoms of a cold or flu. Do not treat yourself. Try to avoid being around people who are sick. Check with your health care provider if you have severe diarrhea, nausea, and vomiting, or if you sweat a lot. The loss of too much body fluid may make it dangerous for you to take this medicine. Do not become pregnant while taking this medicine or for 7 months after stopping it. Women should inform their health care provider if they wish to become pregnant or think they might be pregnant. Men should not father a child while taking this medicine and for 4 months after stopping it. There is a potential for serious harm to an unborn child. Talk to your health care provider for more information. Do not breast-feed an infant while taking this medicine or for 2 months after stopping it. This medicine may make it more difficult to get pregnant or father  a child. Talk to your health care provider if you are concerned about your fertility. What side effects may I notice from receiving this medication? Side effects that you should report to your doctor or health care professional as soon as possible: allergic reactions (skin rash; itching or hives; swelling of the face, lips, or tongue) bleeding (bloody or black, tarry stools; red or dark brown urine; spitting up blood or brown material that looks like coffee grounds; red spots on the skin; unusual bruising or bleeding from the eye, gums, or nose) blurred vision or changes in vision confusion constipation headache heart failure (trouble breathing; fast, irregular heartbeat; sudden weight gain; swelling of the ankles, feet, hands) infection (fever, chills, cough, sore throat, pain or trouble passing urine) lack or loss of appetite liver injury  (dark yellow or brown urine; general ill feeling or flu-like symptoms; loss of appetite, right upper belly pain; yellowing of the eyes or skin) low blood pressure (dizziness; feeling faint or lightheaded, falls; unusually weak or tired) muscle cramps pain, redness, or irritation at site where injected pain, tingling, numbness in the hands or feet seizures trouble breathing unusual bruising or bleeding Side effects that usually do not require medical attention (report to your doctor or health care professional if they continue or are bothersome): diarrhea nausea stomach pain trouble sleeping vomiting This list may not describe all possible side effects. Call your doctor for medical advice about side effects. You may report side effects to FDA at 1-800-FDA-1088. Where should I keep my medication? This medicine is given in a hospital or clinic. It will not be stored at home. NOTE: This sheet is a summary. It may not cover all possible information. If you have questions about this medicine, talk to your doctor, pharmacist, or health care provider.  2023 Elsevier/Gold Standard (2020-04-30 00:00:00)

## 2021-11-09 NOTE — Progress Notes (Signed)
Labs reviewed today, they meet parameters for treatment today.   Treatment given per orders. Patient tolerated it well without problems. Vitals stable and discharged home from clinic ambulatory. Follow up as scheduled.

## 2021-11-10 ENCOUNTER — Telehealth: Payer: Self-pay | Admitting: Orthopedic Surgery

## 2021-11-10 LAB — KAPPA/LAMBDA LIGHT CHAINS
Kappa free light chain: 12.6 mg/L (ref 3.3–19.4)
Kappa, lambda light chain ratio: 1.58 (ref 0.26–1.65)
Lambda free light chains: 8 mg/L (ref 5.7–26.3)

## 2021-11-10 NOTE — Telephone Encounter (Signed)
Patient called back today to relay that she wishes to have [place] send the request form that she had come in and signed yesterday, to be faxed to her previous orthopaedic provider Dr Chauncey Reading, in order to be considered for a second opinion evaluation by Dr Aline Brochure. I called back to patient to relay that we need the provider's name, clinic name, address, phone and fax number, in order to make the request. Authorization form in forms box awaiting this information.

## 2021-11-11 LAB — PROTEIN ELECTROPHORESIS, SERUM
A/G Ratio: 1.7 (ref 0.7–1.7)
Albumin ELP: 3.7 g/dL (ref 2.9–4.4)
Alpha-1-Globulin: 0.3 g/dL (ref 0.0–0.4)
Alpha-2-Globulin: 0.7 g/dL (ref 0.4–1.0)
Beta Globulin: 0.8 g/dL (ref 0.7–1.3)
Gamma Globulin: 0.5 g/dL (ref 0.4–1.8)
Globulin, Total: 2.2 g/dL (ref 2.2–3.9)
M-Spike, %: 0.2 g/dL — ABNORMAL HIGH
Total Protein ELP: 5.9 g/dL — ABNORMAL LOW (ref 6.0–8.5)

## 2021-11-12 ENCOUNTER — Encounter (HOSPITAL_COMMUNITY): Payer: Self-pay | Admitting: Hematology

## 2021-11-12 ENCOUNTER — Inpatient Hospital Stay (HOSPITAL_COMMUNITY): Payer: Medicare Other

## 2021-11-12 VITALS — BP 118/69 | HR 88 | Temp 98.2°F | Resp 18 | Wt 169.4 lb

## 2021-11-12 DIAGNOSIS — Z5112 Encounter for antineoplastic immunotherapy: Secondary | ICD-10-CM | POA: Diagnosis not present

## 2021-11-12 DIAGNOSIS — D509 Iron deficiency anemia, unspecified: Secondary | ICD-10-CM

## 2021-11-12 DIAGNOSIS — C9000 Multiple myeloma not having achieved remission: Secondary | ICD-10-CM

## 2021-11-12 MED ORDER — PROCHLORPERAZINE MALEATE 10 MG PO TABS
10.0000 mg | ORAL_TABLET | Freq: Once | ORAL | Status: AC
  Administered 2021-11-12: 10 mg via ORAL
  Filled 2021-11-12: qty 1

## 2021-11-12 MED ORDER — BORTEZOMIB CHEMO SQ INJECTION 3.5 MG (2.5MG/ML)
1.0000 mg/m2 | Freq: Once | INTRAMUSCULAR | Status: AC
  Administered 2021-11-12: 1.75 mg via SUBCUTANEOUS
  Filled 2021-11-12: qty 0.7

## 2021-11-12 MED ORDER — DENOSUMAB 120 MG/1.7ML ~~LOC~~ SOLN
120.0000 mg | Freq: Once | SUBCUTANEOUS | Status: AC
  Administered 2021-11-12: 120 mg via SUBCUTANEOUS
  Filled 2021-11-12: qty 1.7

## 2021-11-15 ENCOUNTER — Other Ambulatory Visit: Payer: Self-pay

## 2021-11-15 ENCOUNTER — Ambulatory Visit (INDEPENDENT_AMBULATORY_CARE_PROVIDER_SITE_OTHER): Payer: Medicare Other

## 2021-11-15 ENCOUNTER — Emergency Department (HOSPITAL_COMMUNITY)
Admission: EM | Admit: 2021-11-15 | Discharge: 2021-11-15 | Disposition: A | Discharge status: Home / Self Care | Payer: Medicare Other | Attending: Emergency Medicine | Admitting: Emergency Medicine

## 2021-11-15 ENCOUNTER — Encounter (HOSPITAL_COMMUNITY): Payer: Self-pay | Admitting: Emergency Medicine

## 2021-11-15 ENCOUNTER — Emergency Department (HOSPITAL_COMMUNITY): Payer: Medicare Other

## 2021-11-15 DIAGNOSIS — Z96651 Presence of right artificial knee joint: Secondary | ICD-10-CM | POA: Insufficient documentation

## 2021-11-15 DIAGNOSIS — Z7982 Long term (current) use of aspirin: Secondary | ICD-10-CM | POA: Insufficient documentation

## 2021-11-15 DIAGNOSIS — M25561 Pain in right knee: Secondary | ICD-10-CM | POA: Insufficient documentation

## 2021-11-15 DIAGNOSIS — W010XXA Fall on same level from slipping, tripping and stumbling without subsequent striking against object, initial encounter: Secondary | ICD-10-CM | POA: Diagnosis not present

## 2021-11-15 DIAGNOSIS — Y9301 Activity, walking, marching and hiking: Secondary | ICD-10-CM | POA: Diagnosis not present

## 2021-11-15 DIAGNOSIS — Z0000 Encounter for general adult medical examination without abnormal findings: Secondary | ICD-10-CM

## 2021-11-15 LAB — IMMUNOFIXATION ELECTROPHORESIS
IgA: 72 mg/dL (ref 64–422)
IgG (Immunoglobin G), Serum: 461 mg/dL — ABNORMAL LOW (ref 586–1602)
IgM (Immunoglobulin M), Srm: 21 mg/dL — ABNORMAL LOW (ref 26–217)
Total Protein ELP: 5.8 g/dL — ABNORMAL LOW (ref 6.0–8.5)

## 2021-11-15 NOTE — Progress Notes (Signed)
I connected with  Courtney Rogers on 11/15/21 by a audio enabled telemedicine application and verified that I am speaking with the correct person using two identifiers.  Patient Location: Home  Provider Location: Office/Clinic  I discussed the limitations of evaluation and management by telemedicine. The patient expressed understanding and agreed to proceed.  Subjective:   Courtney Rogers is a 71 y.o. female who presents for Medicare Annual (Subsequent) preventive examination.  Review of Systems     Cardiac Risk Factors include: dyslipidemia;hypertension;advanced age (>7men, >50 women)     Objective:    Today's Vitals   11/15/21 0943  PainSc: 5    There is no height or weight on file to calculate BMI.     11/15/2021    9:42 AM 11/05/2021   11:50 AM 11/02/2021    9:00 AM 10/22/2021   10:06 AM 10/19/2021    9:40 AM 10/15/2021   10:28 AM 10/12/2021    9:49 AM  Advanced Directives  Does Patient Have a Medical Advance Directive? No No No No No No No  Would patient like information on creating a medical advance directive? Yes (ED - Information included in AVS) No - Patient declined No - Patient declined No - Patient declined No - Patient declined No - Patient declined No - Patient declined    Current Medications (verified) Outpatient Encounter Medications as of 11/15/2021  Medication Sig   acetaminophen (TYLENOL) 500 MG tablet Take 1,000 mg by mouth 2 (two) times daily as needed for moderate pain or headache.   acyclovir (ZOVIRAX) 400 MG tablet Take 1 tablet (400 mg total) by mouth 2 (two) times daily.   albuterol (PROVENTIL HFA;VENTOLIN HFA) 108 (90 BASE) MCG/ACT inhaler Inhale 2 puffs into the lungs every 6 (six) hours as needed for shortness of breath.   amLODipine (NORVASC) 10 MG tablet Take 1 tablet (10 mg total) by mouth daily.   Ascorbic Acid 500 MG CHEW Chew by mouth.   aspirin EC 81 MG tablet Take 81 mg by mouth at bedtime.   B Complex Vitamins (VITAMIN B COMPLEX) TABS  Take 1 tablet by mouth daily.   bortezomib SQ (VELCADE) 3.5 MG injection Inject into the skin once.   busPIRone (BUSPAR) 5 MG tablet Take 1 tablet (5 mg total) by mouth daily.   Calcium Carbonate-Vitamin D (CALCIUM 600+D PO) Take 1 tablet by mouth 2 (two) times daily.   chlorthalidone (HYGROTON) 25 MG tablet Take 25 mg by mouth daily.    cholecalciferol (VITAMIN D) 25 MCG (1000 UNIT) tablet Take 1,000 Units by mouth daily.   cyanocobalamin 1000 MCG tablet Take 1 tablet by mouth daily.   cyclobenzaprine (FLEXERIL) 10 MG tablet Take 0.5 tablets (5 mg total) by mouth 2 (two) times daily as needed for muscle spasms.   Daratumumab-Hyaluronidase-fihj (DARZALEX FASPRO [place]) Inject into the skin.   fluticasone (FLONASE) 50 MCG/ACT nasal spray USE 2 SPRAYS IN EACH NOSTRIL ONCE DAILY.   fluticasone-salmeterol (ADVAIR) 250-50 MCG/ACT AEPB Inhale 1 puff into the lungs 2 (two) times daily.   folic acid (FOLVITE) 1 MG tablet TAKE 1 TABLET BY MOUTH ONCE DAILY. (Patient taking differently: Take 1 mg by mouth daily.)   gabapentin (NEURONTIN) 600 MG tablet Take 600 mg by mouth 2 (two) times daily.   hydrochlorothiazide (MICROZIDE) 12.5 MG capsule Take 12.5 mg by mouth daily.   lenalidomide (REVLIMID) 15 MG capsule Take 1 capsule (15 mg total) by mouth daily. 21 days on, 7 days off every 28 days  lidocaine (XYLOCAINE) 2 % solution TAKE 2 TEASPOONFULS BEFORE MEALS AND AT BEDTIME AS NEEDED MAY REPEAT EVERY 4 HOURS. (MAX OF 8 DOSES PER DAY)   Lidocaine HCl (XOLIDO) 2 % CREA Apply 1 application topically daily as needed (arthritisis).   losartan (COZAAR) 100 MG tablet Take 100 mg by mouth daily.   magnesium oxide (MAG-OX) 400 (240 Mg) MG tablet Take 1 tablet (400 mg total) by mouth 2 (two) times daily.   metoprolol tartrate (LOPRESSOR) 50 MG tablet Take 1 tablet (50 mg total) by mouth 2 (two) times daily.   mirtazapine (REMERON) 15 MG tablet Take 15 mg by mouth at bedtime.   montelukast (SINGULAIR) 10 MG tablet Take 10  mg by mouth daily.    MYRBETRIQ 25 MG TB24 tablet Take 25 mg by mouth daily.   nitroGLYCERIN (NITROSTAT) 0.4 MG SL tablet Place 0.4 mg under the tongue every 5 (five) minutes as needed for chest pain.   pantoprazole (PROTONIX) 40 MG tablet Take 40 mg by mouth daily.   potassium chloride (KLOR-CON M) 10 MEQ tablet Take 1 tablet (10 mEq total) by mouth 2 (two) times daily.   potassium chloride SA (KLOR-CON M) 20 MEQ tablet Take 1 tablet (20 mEq total) by mouth 3 (three) times daily.   primidone (MYSOLINE) 50 MG tablet Take 50 mg by mouth daily.   Probiotic Product (GNP PROBIOTIC COLON SUPPORT) CAPS Take 1 capsule by mouth daily.   sertraline (ZOLOFT) 100 MG tablet Take 200 mg by mouth daily.    simvastatin (ZOCOR) 40 MG tablet Take 40 mg by mouth at bedtime.   warfarin (COUMADIN) 3 MG tablet Take 3 mg by mouth daily.   No facility-administered encounter medications on file as of 11/15/2021.    Allergies (verified) Amoxicillin-pot clavulanate, Biaxin [clarithromycin], Erythromycin, and Lisinopril   History: Past Medical History:  Diagnosis Date   Acute myocardial infarction [facility]) 2009   CAD/no stent medically managed   Anxiety disorder    CAD (coronary artery disease)    Cancer (HCC)    Hyperlipidemia    Hypertension    IBS (irritable bowel syndrome)    Non-ulcer dyspepsia 04/30/2009   Qualifier: Diagnosis of  By: Darrick Penna MD, Sandi L    Overactive bladder    PE (pulmonary embolism) 10/2012   Presence of permanent cardiac pacemaker    Sleep apnea    Past Surgical History:  Procedure Laterality Date   ABDOMINAL HYSTERECTOMY     BSO secondary to cyst     CARDIAC CATHETERIZATION     COLONOSCOPY  12/2009   Dr. Aleene Davidson, propofol, normal. Next TCS 12/2019   COLONOSCOPY N/A 05/22/2017   Examined portion ileum was normal, significant looping of colon, external hemorrhoids and rectal bleeding due to internal hemorrhoids, mild diverticulosis procedure: COLONOSCOPY;  Surgeon: West Bali, MD;  Location: [facility];  Service: Endoscopy;  Laterality: N/A;  10:30am   ESOPHAGOGASTRODUODENOSCOPY  05/11/2009   schatzki ring/small hiatal hernia/path:gastritis   ESOPHAGOGASTRODUODENOSCOPY N/A 05/22/2017   Multiple gastric polyps with biopsy benign fundic gland polyp, small bowel biopsy negative for celiac, gastric biopsy with mild gastritis but no H. pylori procedure: ESOPHAGOGASTRODUODENOSCOPY (EGD);  Surgeon: West Bali, MD;  Location: [facility];  Service: Endoscopy;  Laterality: N/A;   ESOPHAGOGASTRODUODENOSCOPY (EGD) WITH ESOPHAGEAL DILATION N/A 04/01/2013   [id number] at the gastroesophageal juction/multiple small polyps/mild gastritis   GIVENS CAPSULE STUDY N/A 06/07/2017   Frequent gastric erosions, normal small bowel mucosa.  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   Family History  Problem Relation Age of Onset   Colon polyps Neg Hx    Colon cancer Neg Hx    Social History   Socioeconomic History   Marital status: Married    Spouse name: [name]   Number of children: 3   Years of education: 16   Highest education level: Not on file  Occupational History   Not on file  Tobacco Use   Smoking status: Never   Smokeless tobacco: Never   Tobacco comments:    Never smoked   Vaping Use   Vaping Use: Never used  Substance and Sexual Activity   Alcohol use: Yes    Alcohol/week: 0.0 standard drinks of alcohol    Comment: occ wine   Drug use: No   Sexual activity: Not Currently  Other Topics Concern   Not on file  Social History Narrative      Lives with husband-51 years 950 in Aug 2021   Daughter is close by, 2 sons live further away   One IN ,ONE IN [place], ONE BESIDE HER.        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   Enjoys: reading, young adult      Diet: eats all food groups    Caffeine: coffee 1, tea daily soda-1 daily   Water: 1-2 cups      Wears seat belt    Does not use phone while driving   Smoke detectors at home    Licensed conveyancer -locked up      Left handed   One story home   Drinks caffeine   Social Determinants of Health   Financial Resource Strain: Low Risk  (11/15/2021)   Overall Financial Resource Strain (CARDIA)    Difficulty of Paying Living Expenses: Not hard at all  Food Insecurity: No Food Insecurity (11/15/2021)   Hunger Vital Sign    Worried About Running Out of Food in the Last Year: Never true    Ran Out of Food in the Last Year: Never true  Transportation Needs: No Transportation Needs (11/15/2021)   PRAPARE - Administrator, Civil Service (Medical): No    Lack of Transportation (Non-Medical): No  Physical Activity: Inactive (10/22/2021)   Exercise Vital Sign    Days of Exercise per Week: 0 days    Minutes of Exercise per Session: 0 min  Stress: Stress Concern Present (10/22/2021)   [employer] of Occupational Health - Occupational Stress Questionnaire    Feeling of Stress : To some extent  Social Connections: Moderately Integrated (11/11/2020)   Social Connection and Isolation Panel [NHANES]    Frequency of Communication with Friends and Family: Once a week    Frequency of Social Gatherings with Friends and Family: Once a week    Attends Religious Services: More than 4 times per year    Active Member of [employer] or Organizations: Yes    Attends Engineer, structural: More than 4 times per year    Marital Status: Married    Tobacco Counseling Counseling given: Not Answered Tobacco comments: Never smoked    Clinical Intake:  Pre-visit preparation completed: Yes  Pain : No/denies pain Pain Score: 5  Pain Type:  Acute pain Pain Location: Leg Pain Orientation: Right     Diabetes: No     Diabetic?na          Activities of Daily Living    11/15/2021    9:28 AM 07/12/2021    7:26 AM  In your present state of health, do you have any difficulty performing the following activities:  Hearing? 0 0  Vision? 0 0  Difficulty concentrating or making decisions? 0 0  Walking or climbing stairs? 1 1  Comment alot of stairs- especially going down   Dressing or bathing? 0 0  Doing errands, shopping? 0   Preparing Food and eating ? N   Using the Toilet? N   In the past six months, have you accidently leaked urine? N   Do you have problems with loss of bowel control? N   Managing your Medications? N   Managing your Finances? N   Housekeeping or managing your Housekeeping? N     Patient Care Team: [name], FNP as PCP - General (Nurse Practitioner) Doreatha Massed, MD as Medical Oncologist (Oncology) Mickie Bail, RN as Oncology Nurse Navigator (Oncology) Lanelle Bal, DO as Consulting Physician (Internal Medicine) Randa Spike Kelton Pillar, LCSW as [facility] Care Management (Licensed Clinical Social Worker)  Indicate any recent Medical Services you may have received from other than [facility] providers in the past year (date may be approximate).     Assessment:   This is a routine wellness examination for [facility].  Hearing/Vision screen No results found.  Dietary issues and exercise activities discussed: Current Exercise Habits: The patient does not participate in regular exercise at present, Exercise limited by: orthopedic condition(s)   Goals Addressed             This Visit's Progress    DIET - INCREASE WATER INTAKE   On track      Depression Screen    11/15/2021    9:28 AM 10/22/2021   11:08 AM 09/16/2021   10:13 AM 07/30/2021    3:09 PM 07/23/2021    2:15 PM 06/25/2021   12:03 PM 06/18/2021   11:30 AM  PHQ 2/9 Scores  PHQ - 2 Score 0 2 0 2 2 2 2   PHQ- 9 Score  8 0 7 8 10 10     Fall Risk    11/15/2021    9:26 AM 09/16/2021   10:13 AM  08/03/2021   10:31 AM 06/18/2021   11:30 AM 06/04/2021   10:57 AM  Fall Risk   Falls in the past year? 1 1  0 0  Number falls in past yr: 0 1  0 0  Injury with Fall? 1 1  0 0  Risk for fall due to :  History of fall(s) Impaired mobility;Impaired balance/gait  No Fall Risks  Follow up  Falls evaluation completed Falls evaluation completed  Falls evaluation completed    FALL RISK PREVENTION PERTAINING TO THE HOME:  Any stairs in or around the home? No  If so, are there any without handrails? No  Home free of loose throw rugs in walkways, pet beds, electrical cords, etc? Yes  Adequate lighting in your home to reduce risk of falls? Yes   ASSISTIVE DEVICES UTILIZED TO PREVENT FALLS:  Life alert? Yes  doesn't use it  Use of a cane, walker or w/c? Yes  walker  Grab bars in the bathroom? No  Shower chair or bench in shower? Yes  Elevated toilet seat or  a handicapped toilet? No     Cognitive Function:        11/15/2021    9:42 AM 11/11/2020   11:42 AM 11/11/2020    9:31 AM  6CIT Screen  What Year? 0 points 0 points 0 points  What month? 0 points 0 points 0 points  What time? 0 points 0 points 0 points  Count back from 20 0 points 0 points 0 points  Months in reverse 0 points 0 points 0 points  Repeat phrase 0 points 0 points 0 points  Total Score 0 points 0 points 0 points    Immunizations Immunization History  Administered Date(s) Administered   Fluad Quad(high Dose 65+) 02/22/2020   Influenza, High Dose Seasonal PF 01/23/2019   Influenza,inj,quad, With Preservative 02/09/2018   Influenza-Unspecified 03/04/2021   Moderna Sars-Covid-2 Vaccination 06/13/2019, 07/10/2019, 06/09/2020, 03/04/2021   Pneumococcal Conjugate-13 12/14/2017   Pneumococcal Polysaccharide-23 07/15/2020   Tdap 07/15/2020   Zoster Recombinat (Shingrix) 12/28/2020, 04/02/2021    TDAP status: Up to date  Flu Vaccine status: Up to date  Pneumococcal vaccine status: Up to date  Covid-19 vaccine  status: Completed vaccines advised she can get one more since over age 41  Qualifies for Shingles Vaccine? Yes   Zostavax completed No   Shingrix Completed?: Yes  Screening Tests Health Maintenance  Topic Date Due   COVID-19 Vaccine (5 - Booster for Moderna series) 04/29/2021   INFLUENZA VACCINE  12/21/2021   MAMMOGRAM  02/06/2022   COLONOSCOPY (Pts 45-44yrs Insurance coverage will need to be confirmed)  05/23/2027   TETANUS/TDAP  07/15/2030   Pneumonia Vaccine 45+ Years old  Completed   DEXA SCAN  Completed   Hepatitis C Screening  Completed   Zoster Vaccines- Shingrix  Completed   HPV VACCINES  Aged Out    Health Maintenance  Health Maintenance Due  Topic Date Due   COVID-19 Vaccine (5 - Booster for Moderna series) 04/29/2021    Colorectal cancer screening: Type of screening: Colonoscopy. Completed 2018. Repeat every 10 years  Mammogram status: Completed at [facility] . Repeat every year  Bone Density status: Completed 2021. Results reflect: Bone density results: OSTEOPENIA. Repeat every 2 years.  Lung Cancer Screening: (Low Dose CT Chest recommended if Age 73-80 years, 30 pack-year currently smoking OR have quit w/in 15years.) does not qualify.   Lung Cancer Screening Referral: na  Additional Screening:  Hepatitis C Screening: does qualify; Completed   Vision Screening: Recommended annual ophthalmology exams for early detection of glaucoma and other disorders of the eye. Is the patient up to date with their annual eye exam?  Yes  Who is the provider or what is the name of the office in which the patient attends annual eye exams? [facility]  If pt is not established with a provider, would they like to be referred to a provider to establish care? No .   Dental Screening: Recommended annual dental exams for proper oral hygiene  Community Resource Referral / Chronic Care Management: CRR required this visit?  No   CCM required this visit?  No       Plan:     I have personally reviewed and noted the following in the patient's chart:   Medical and social history Use of alcohol, tobacco or illicit drugs  Current medications and supplements including opioid prescriptions.  Functional ability and status Nutritional status Physical activity Advanced directives List of other physicians Hospitalizations, surgeries, and ER visits in previous 12 months Vitals  Screenings to include cognitive, depression, and falls Referrals and appointments  In addition, I have reviewed and discussed with patient certain preventive protocols, quality metrics, and best practice recommendations. A written personalized care plan for preventive services as well as general preventive health recommendations were provided to patient.     Abner Greenspan, LPN   6/96/2952   Nurse Notes:  Courtney Rogers , Thank you for taking time to come for your Medicare Wellness Visit. I appreciate your ongoing commitment to your health goals. Please review the following plan we discussed and let me know if I can assist you in the future.   These are the goals we discussed:  Goals       DIET - INCREASE WATER INTAKE      Manage Anxiety issues. Manage Caregiver Stress issues (pt-stated)      Time Frame:  Short Term Goal Priority; Medium  Progress;  On Track  Start Date:  07/23/21   Expected End Date:  01/13/22    Follow up Date:  11/26/21 at 1:00 PM   Manage Anxiety issues. Manage Caregiver Stress issues  Patient Self Care Activities:  Attends all scheduled provider appointments Performs ADL's independently  Patient Coping Strengths:  Family Friends  Patient Self Care Deficits:  Walking challenges Caregiver stress issues Sleeping challenges  Patient Goals:  - spend time or talk with others at least 2 to 3 times per week - practice relaxation or meditation daily - keep a calendar with appointment dates  Follow Up Plan: LCSW to call client on 11/26/21 at  1:00 PM         This is a list of the screening recommended for you and due dates:  Health Maintenance  Topic Date Due   COVID-19 Vaccine (5 - Booster for Moderna series) 04/29/2021   Flu Shot  12/21/2021   Mammogram  02/06/2022   Colon Cancer Screening  05/23/2027   Tetanus Vaccine  07/15/2030   Pneumonia Vaccine  Completed   DEXA scan (bone density measurement)  Completed   Hepatitis C Screening: USPSTF Recommendation to screen - Ages 71-79 yo.  Completed   Zoster (Shingles) Vaccine  Completed   HPV Vaccine  Aged Out
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
## 2021-11-16 ENCOUNTER — Inpatient Hospital Stay (HOSPITAL_COMMUNITY): Payer: Medicare Other

## 2021-11-16 ENCOUNTER — Inpatient Hospital Stay (HOSPITAL_BASED_OUTPATIENT_CLINIC_OR_DEPARTMENT_OTHER): Payer: Medicare Other | Admitting: Hematology

## 2021-11-16 VITALS — BP 125/75 | HR 91 | Temp 97.9°F | Resp 18 | Ht 64.76 in | Wt 165.4 lb

## 2021-11-16 DIAGNOSIS — C9000 Multiple myeloma not having achieved remission: Secondary | ICD-10-CM

## 2021-11-16 DIAGNOSIS — Z5112 Encounter for antineoplastic immunotherapy: Secondary | ICD-10-CM | POA: Diagnosis not present

## 2021-11-16 LAB — COMPREHENSIVE METABOLIC PANEL
ALT: 17 U/L (ref 0–44)
AST: 20 U/L (ref 15–41)
Albumin: 4 g/dL (ref 3.5–5.0)
Alkaline Phosphatase: 57 U/L (ref 38–126)
Anion gap: 9 (ref 5–15)
BUN: 14 mg/dL (ref 8–23)
CO2: 25 mmol/L (ref 22–32)
Calcium: 7.8 mg/dL — ABNORMAL LOW (ref 8.9–10.3)
Chloride: 105 mmol/L (ref 98–111)
Creatinine, Ser: 0.79 mg/dL (ref 0.44–1.00)
GFR, Estimated: >60 mL/min (ref >60)
Glucose, Bld: 83 mg/dL (ref 70–99)
Potassium: 3.5 mmol/L (ref 3.5–5.1)
Sodium: 139 mmol/L (ref 135–145)
Total Bilirubin: 0.7 mg/dL (ref 0.3–1.2)
Total Protein: 6.5 g/dL (ref 6.5–8.1)

## 2021-11-16 LAB — CBC WITH DIFFERENTIAL/PLATELET
Abs Immature Granulocytes: 0.01 10*3/uL (ref 0.00–0.07)
Basophils Absolute: 0.1 10*3/uL (ref 0.0–0.1)
Basophils Relative: 2 %
Eosinophils Absolute: 0.3 10*3/uL (ref 0.0–0.5)
Eosinophils Relative: 7 %
HCT: 34.2 % — ABNORMAL LOW (ref 36.0–46.0)
Hemoglobin: 10.9 g/dL — ABNORMAL LOW (ref 12.0–15.0)
Immature Granulocytes: 0 %
Lymphocytes Relative: 16 %
Lymphs Abs: 0.8 10*3/uL (ref 0.7–4.0)
MCH: 31.8 pg (ref 26.0–34.0)
MCHC: 31.9 g/dL (ref 30.0–36.0)
MCV: 99.7 fL (ref 80.0–100.0)
Monocytes Absolute: 0.9 10*3/uL (ref 0.1–1.0)
Monocytes Relative: 18 %
Neutro Abs: 2.8 10*3/uL (ref 1.7–7.7)
Neutrophils Relative %: 57 %
Platelets: 83 10*3/uL — ABNORMAL LOW (ref 150–400)
RBC: 3.43 MIL/uL — ABNORMAL LOW (ref 3.87–5.11)
RDW: 16.3 % — ABNORMAL HIGH (ref 11.5–15.5)
WBC: 4.9 10*3/uL (ref 4.0–10.5)
nRBC: 0 % (ref 0.0–0.2)

## 2021-11-16 LAB — MAGNESIUM: Magnesium: 2.1 mg/dL (ref 1.7–2.4)

## 2021-11-16 MED ORDER — DEXAMETHASONE 4 MG PO TABS
20.0000 mg | ORAL_TABLET | Freq: Once | ORAL | Status: AC
  Administered 2021-11-16: 20 mg via ORAL
  Filled 2021-11-16: qty 5

## 2021-11-16 MED ORDER — DIPHENHYDRAMINE HCL 25 MG PO CAPS
25.0000 mg | ORAL_CAPSULE | Freq: Once | ORAL | Status: AC
  Administered 2021-11-16: 25 mg via ORAL
  Filled 2021-11-16: qty 1

## 2021-11-16 MED ORDER — ACETAMINOPHEN 325 MG PO TABS
650.0000 mg | ORAL_TABLET | Freq: Once | ORAL | Status: AC
  Administered 2021-11-16: 650 mg via ORAL
  Filled 2021-11-16: qty 2

## 2021-11-16 MED ORDER — DARATUMUMAB-HYALURONIDASE-FIHJ 1800-30000 MG-UT/15ML ~~LOC~~ SOLN
1800.0000 mg | Freq: Once | SUBCUTANEOUS | Status: AC
  Administered 2021-11-16: 1800 mg via SUBCUTANEOUS
  Filled 2021-11-16: qty 15

## 2021-11-16 NOTE — Progress Notes (Signed)
Platelets 83 today with ok to treat per Dr. Ellin Saba.   Patient tolerated Daratumumab injection with no complaints voiced.  See MAR for details.  Labs reviewed. Injection site clean and dry with no bruising or swelling noted at site.  Band aid applied.  Vss with discharge and left in satisfactory condition with no s/s of distress noted.

## 2021-11-17 ENCOUNTER — Ambulatory Visit: Payer: Medicare Other

## 2021-11-18 NOTE — ED Provider Notes (Signed)
[facility] EMERGENCY DEPARTMENT Provider Note   CSN: [id number] Arrival date & time: 11/15/21  1032     History  Chief Complaint  Patient presents with   Courtney Rogers is a 71 y.o. female.   Fall   Courtney Rogers is a 71 y.o. female who presents to the Emergency Department complaining of fall and right knee pain.  She slipped and fell outside in the rain, incident occurred several days ago.  She describes aching pain to her right knee that she been somewhat improving.  She continues to have pain with flexion.  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  She is concerned that she has damaged the hardware in her knee.  She denies head injury, LOC, neck, back or hip pain.        Home Medications Prior to Admission medications   Medication Sig Start Date End Date Taking? Authorizing Provider  acetaminophen (TYLENOL) 500 MG tablet Take 1,000 mg by mouth 2 (two) times daily as needed for moderate pain or headache.    [provider]  acyclovir (ZOVIRAX) 400 MG tablet Take 1 tablet (400 mg total) by mouth 2 (two) times daily. 08/09/21   Derek Jack, MD  albuterol (PROVENTIL HFA;VENTOLIN HFA) 108 (90 BASE) MCG/ACT inhaler Inhale 2 puffs into the lungs every 6 (six) hours as needed for shortness of breath.    [provider]  amLODipine (NORVASC) 10 MG tablet Take 1 tablet (10 mg total) by mouth daily. 07/15/20   Noreene Larsson, NP  Ascorbic Acid 500 MG CHEW Chew by mouth.    [provider]  aspirin EC 81 MG tablet Take 81 mg by mouth at bedtime.    [provider]  B Complex Vitamins (VITAMIN B COMPLEX) TABS Take 1 tablet by mouth daily.    [provider]  bortezomib SQ (VELCADE) 3.5 MG injection Inject into the skin once. 08/10/21   [provider]  busPIRone (BUSPAR) 5 MG tablet Take 1 tablet (5 mg total) by mouth daily. 09/23/21   Renee Rival, FNP  Calcium Carbonate-Vitamin D (CALCIUM 600+D PO)  Take 1 tablet by mouth 2 (two) times daily.    [provider]  chlorthalidone (HYGROTON) 25 MG tablet Take 25 mg by mouth daily.     [provider]  cholecalciferol (VITAMIN D) 25 MCG (1000 UNIT) tablet Take 1,000 Units by mouth daily. 10/15/20   [provider]  cyanocobalamin 1000 MCG tablet Take 1 tablet by mouth daily. 10/12/20   [provider]  cyclobenzaprine (FLEXERIL) 10 MG tablet Take 0.5 tablets (5 mg total) by mouth 2 (two) times daily as needed for muscle spasms. 08/30/21   Marcello Fennel, PA-C  Daratumumab-Hyaluronidase-fihj (DARZALEX FASPRO [place]) Inject into the skin. 08/10/21   [provider]  fluticasone (FLONASE) 50 MCG/ACT nasal spray USE 2 SPRAYS IN EACH NOSTRIL ONCE DAILY. 10/06/21   Fayrene Helper, MD  fluticasone-salmeterol (ADVAIR) 250-50 MCG/ACT AEPB Inhale 1 puff into the lungs 2 (two) times daily. 10/12/20   [provider]  folic acid (FOLVITE) 1 MG tablet TAKE 1 TABLET BY MOUTH ONCE DAILY. Patient taking differently: Take 1 mg by mouth daily. 02/09/21   Derek Jack, MD  gabapentin (NEURONTIN) 600 MG tablet Take 600 mg by mouth 2 (two) times daily. 05/07/19   [provider]  hydrochlorothiazide (MICROZIDE) 12.5 MG capsule Take 12.5 mg by mouth daily. 11/04/21   [provider]  hydrOXYzine (VISTARIL) 25 MG capsule     [provider]  Hyoscyamine Sulfate SL 0.125 MG SUBL     [provider]  lenalidomide (REVLIMID) 15 MG capsule Take 1 capsule (15 mg total) by mouth daily. 21 days on, 7 days off every 28 days 10/19/21   Derek Jack, MD  lidocaine (XYLOCAINE) 2 % solution TAKE 2 TEASPOONFULS BEFORE MEALS AND AT BEDTIME AS NEEDED MAY REPEAT EVERY 4 HOURS. (MAX OF 8 DOSES PER DAY) 06/02/21   Mahala Menghini, PA-C  Lidocaine HCl (XOLIDO) 2 % CREA Apply 1 application topically daily as needed (arthritisis).    [provider]  loperamide (IMODIUM) 2 MG capsule      [provider]  losartan (COZAAR) 100 MG tablet Take 100 mg by mouth daily. 01/16/21   [provider]  magnesium oxide (MAG-OX) 400 (240 Mg) MG tablet Take 1 tablet (400 mg total) by mouth 2 (two) times daily. 09/21/21   Derek Jack, MD  meclizine (ANTIVERT) 25 MG tablet     [provider]  metoprolol tartrate (LOPRESSOR) 50 MG tablet Take 1 tablet (50 mg total) by mouth 2 (two) times daily. 10/07/21   Renee Rival, FNP  mirtazapine (REMERON) 15 MG tablet Take 15 mg by mouth at bedtime. 11/05/21   [provider]  montelukast (SINGULAIR) 10 MG tablet Take 10 mg by mouth daily.     [provider]  MYRBETRIQ 25 MG TB24 tablet Take 25 mg by mouth daily. 09/14/21   [provider]  nitroGLYCERIN (NITROSTAT) 0.4 MG SL tablet Place 0.4 mg under the tongue every 5 (five) minutes as needed for chest pain.    [provider]  ondansetron (ZOFRAN-ODT) 4 MG disintegrating tablet     [provider]  pantoprazole (PROTONIX) 40 MG tablet Take 40 mg by mouth daily. 08/10/20   [provider]  potassium chloride (KLOR-CON M) 10 MEQ tablet Take 1 tablet (10 mEq total) by mouth 2 (two) times daily. 09/21/21   Derek Jack, MD  potassium chloride SA (KLOR-CON M) 20 MEQ tablet Take 1 tablet (20 mEq total) by mouth 3 (three) times daily. 09/22/82   Delora Fuel, MD  primidone (MYSOLINE) 50 MG tablet Take 50 mg by mouth daily. 03/31/21   [provider]  Probiotic Product (GNP PROBIOTIC COLON SUPPORT) CAPS Take 1 capsule by mouth daily. 07/08/21   [provider]  sertraline (ZOLOFT) 100 MG tablet Take 200 mg by mouth daily.     [provider]  simvastatin (ZOCOR) 40 MG tablet Take 40 mg by mouth at bedtime.    [provider]  sucralfate (CARAFATE) 1 GM/10ML suspension     [provider]  tolterodine (DETROL LA) 4 MG 24 hr capsule     [provider]  warfarin  (COUMADIN) 3 MG tablet Take 3 mg by mouth daily. 06/23/21   [provider]      Allergies    Amoxicillin-pot clavulanate, Biaxin [clarithromycin], Erythromycin, and Lisinopril    Review of Systems   Review of Systems  Constitutional:  Negative for chills and fever.  Musculoskeletal:  Positive for arthralgias (right knee pain) and joint swelling. Negative for back pain and neck pain.  Skin:  Negative for color change and wound.  Neurological:  Negative for dizziness, syncope, weakness and numbness.    Physical Exam Updated Vital Signs BP (!) 160/76 (BP Location: Left Arm)   Pulse 69   Temp 97.9 F (36.6  C) (Oral)   Resp 17   SpO2 98%  Physical Exam Vitals and nursing note reviewed.  Constitutional:      General: She is not in acute distress.    Appearance: Normal appearance. She is not ill-appearing.  Cardiovascular:     Rate and Rhythm: Normal rate and regular rhythm.     Pulses: Normal pulses.  Pulmonary:     Effort: Pulmonary effort is normal.  Chest:     Chest wall: No tenderness.  Abdominal:     Palpations: Abdomen is soft.     Tenderness: There is no abdominal tenderness.  Musculoskeletal:        General: Swelling, tenderness and signs of injury present.     Cervical back: Normal range of motion. No tenderness.     Comments: Diffuse ttp of anterior right knee.  Mild edema, no palpable effusion.  No  Erythema or excessive warmth. Pt has FROM, mild pain with valgus and varus stress  Skin:    General: Skin is warm.     Capillary Refill: Capillary refill takes less than 2 seconds.     Findings: No erythema or rash.  Neurological:     General: No focal deficit present.     Mental Status: She is alert.     Sensory: No sensory deficit.     Motor: No weakness.     ED Results / Procedures / Treatments   Labs (all labs ordered are listed, but only abnormal results are displayed) Labs Reviewed - No data to display  EKG None  Radiology DG Knee 2 Views  Right  Result Date: 11/15/2021 CLINICAL DATA:  [place]. Right knee pain. EXAM: RIGHT KNEE - 1-2 VIEW COMPARISON:  None Available. FINDINGS: No acute fractures identified. The prosthesis is intact. No obvious joint effusion. IMPRESSION: No acute bony findings. Electronically Signed   By: Marijo Sanes M.D.   On: 11/15/2021 11:43     Procedures Procedures    Medications Ordered in ED Medications - No data to display  ED Course/ Medical Decision Making/ A&P                           Medical Decision Making Amount and/or Complexity of Data Reviewed Radiology: ordered.   Pt here for injury of right knee after a mechanical fall.  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    She able to bear weight to the affected extremity.  XR of the knee w/o evidence of acute bony injury, no effusion.    Discussed findings with pt, knee brace applied for support.  She agrees to symptomatic tx and close orthopedic f/u         Final Clinical Impression(s) / ED Diagnoses Final diagnoses:  Acute pain of right knee    Rx / DC Orders ED Discharge Orders     None         Bufford Lope 11/18/21 2057    Dorie Rank, MD 11/19/21 [phone]

## 2021-11-19 ENCOUNTER — Other Ambulatory Visit: Payer: Self-pay

## 2021-11-19 ENCOUNTER — Ambulatory Visit (INDEPENDENT_AMBULATORY_CARE_PROVIDER_SITE_OTHER): Payer: Medicare Other | Admitting: Family Medicine

## 2021-11-19 ENCOUNTER — Encounter: Payer: Self-pay | Admitting: Family Medicine

## 2021-11-19 ENCOUNTER — Other Ambulatory Visit (HOSPITAL_COMMUNITY): Payer: Self-pay

## 2021-11-19 VITALS — BP 146/79 | HR 85 | Ht 63.0 in | Wt 164.4 lb

## 2021-11-19 DIAGNOSIS — M25561 Pain in right knee: Secondary | ICD-10-CM | POA: Diagnosis not present

## 2021-11-19 DIAGNOSIS — G8929 Other chronic pain: Secondary | ICD-10-CM

## 2021-11-19 DIAGNOSIS — D508 Other iron deficiency anemias: Secondary | ICD-10-CM

## 2021-11-19 DIAGNOSIS — R2681 Unsteadiness on feet: Secondary | ICD-10-CM

## 2021-11-19 DIAGNOSIS — F419 Anxiety disorder, unspecified: Secondary | ICD-10-CM

## 2021-11-19 DIAGNOSIS — I10 Essential (primary) hypertension: Secondary | ICD-10-CM

## 2021-11-19 DIAGNOSIS — M25562 Pain in left knee: Secondary | ICD-10-CM

## 2021-11-19 DIAGNOSIS — W19XXXS Unspecified fall, sequela: Secondary | ICD-10-CM | POA: Diagnosis not present

## 2021-11-19 DIAGNOSIS — F32A Depression, unspecified: Secondary | ICD-10-CM

## 2021-11-19 DIAGNOSIS — Z9889 Other specified postprocedural states: Secondary | ICD-10-CM | POA: Diagnosis not present

## 2021-11-19 MED ORDER — DICLOFENAC SODIUM 1 % EX GEL: 4.0000 g | Freq: Four times a day (QID) | CUTANEOUS | 0 refills | Status: AC

## 2021-11-19 MED ORDER — LENALIDOMIDE 15 MG PO CAPS: 15.0000 mg | ORAL_CAPSULE | Freq: Every day | ORAL | 0 refills | Status: DC

## 2021-11-19 NOTE — Patient Instructions (Addendum)
I appreciate the opportunity to provide care to you today!    Please pick up your prescription at the pharmacy  Referrals today-  Orthopedic surgery   Please continue to a heart-healthy diet and increase your physical activities. Try to exercise for 93mns at least three times a week.      It was a pleasure to see you and I look forward to continuing to work together on your health and well-being. Please do not hesitate to call the office if you need care or have questions about your care.   Have a wonderful day and week. With Gratitude, GAlvira MondayMSN, FNP-BC

## 2021-11-19 NOTE — Progress Notes (Signed)
Acute Office Visit  Subjective:     Patient ID: Courtney Rogers, female    DOB: Oct 02, 1950, 71 y.o.   MRN: [id number]  Chief Complaint  Patient presents with   Knee Pain    C/o knee swelling and pain had a fall last week on 11/11/21.     HPI  The patient is in today for c/o of knee swelling and mild pain. She slipped, fell in the rain on 11/11/21, and landed on her left side. She was seen in the ED on 11/15/21 after the incident.XR of the knee w/o evidence of acute bony injury, no effusion.  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 She can bear weight on the affected extremity. She has her knee brace on the affected leg. She reports following up with an orthopedic while in [place] due to her frequent falls and gait instability. She ambulates with her walker.    Review of Systems  Constitutional:  Negative for chills and fever.  Respiratory:  Negative for cough.   Genitourinary:  Negative for hematuria.  Musculoskeletal:  Positive for falls and joint pain.  Neurological:  Negative for dizziness and headaches.        Objective:    BP (!) 146/79   Pulse 85   Ht '5\' 3"'$  (1.6 m)   Wt 164 lb 6.4 oz (74.6 kg)   SpO2 93%   BMI 29.12 kg/m    Physical Exam HENT:     Left Ear: External ear normal.  Cardiovascular:     Rate and Rhythm: Normal rate and regular rhythm.     Pulses: Normal pulses.     Heart sounds: Normal heart sounds.  Pulmonary:     Effort: Pulmonary effort is normal.     Breath sounds: Normal breath sounds.  Musculoskeletal:     Right lower leg: No edema.     Left lower leg: No edema.     Comments: no palpable effusion.  No  erythema or excessive warmth. mild pain with valgus and varus stress.  Skin:    Findings: No lesion.  Neurological:     Mental Status: She is alert.     No results found for any visits on 11/19/21.      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 and reports following up with an orthopedic while in [place] due to her frequent falls and gait instability -I recommend close f/u with orthopedic  -Referral placed to orthopedic -Voltaren Gel ordered for pain      Relevant Orders   Ambulatory referral to Orthopedic Surgery   Other Visit Diagnoses     Acute pain of right knee    -  Primary   Right knee pain, unspecified chronicity       Relevant Medications   diclofenac Sodium (VOLTAREN) 1 % GEL   Hx of right knee surgery       Relevant Orders   Ambulatory referral to Orthopedic Surgery   Unsteady gait when walking       Relevant Orders   Ambulatory referral to Orthopedic Surgery       Meds ordered this encounter  Medications   diclofenac Sodium (VOLTAREN) 1 % GEL    Sig: Apply 4 g topically 4 (four) times daily.    Dispense:  50 g    Refill:  0    No follow-ups on file.  Alvira Monday, FNP

## 2021-11-19 NOTE — Telephone Encounter (Signed)
Chart reviewed. Revlimid refilled per last office note with Dr. Katragadda.  

## 2021-11-19 NOTE — Assessment & Plan Note (Signed)
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

## 2021-11-26 ENCOUNTER — Ambulatory Visit (INDEPENDENT_AMBULATORY_CARE_PROVIDER_SITE_OTHER): Payer: BC Managed Care – PPO | Admitting: Licensed Clinical Social Worker

## 2021-11-26 DIAGNOSIS — K219 Gastro-esophageal reflux disease without esophagitis: Secondary | ICD-10-CM

## 2021-11-26 DIAGNOSIS — R251 Tremor, unspecified: Secondary | ICD-10-CM

## 2021-11-26 DIAGNOSIS — D508 Other iron deficiency anemias: Secondary | ICD-10-CM

## 2021-11-26 DIAGNOSIS — F32A Depression, unspecified: Secondary | ICD-10-CM

## 2021-11-26 DIAGNOSIS — G8929 Other chronic pain: Secondary | ICD-10-CM

## 2021-11-26 DIAGNOSIS — D472 Monoclonal gammopathy: Secondary | ICD-10-CM

## 2021-11-26 DIAGNOSIS — I10 Essential (primary) hypertension: Secondary | ICD-10-CM

## 2021-11-26 DIAGNOSIS — R5383 Other fatigue: Secondary | ICD-10-CM

## 2021-11-26 NOTE — Chronic Care Management (AMB) (Signed)
Chronic Care Management    Clinical Social Work Note  11/26/2021 Name: Courtney Rogers MRN: [id number] DOB: 1951/04/03  Courtney Rogers is a 71 y.o. year old female who is a primary care patient of Renee Rival, FNP. The CCM team was consulted to assist the patient with chronic disease management and/or care coordination needs related to: [employer] .   Engaged with patient by telephone for follow up visit in response to provider referral for social work chronic care management and care coordination services.   Consent to Services:  The patient was given information about Chronic Care Management services, agreed to services, and gave verbal consent prior to initiation of services.  Please see initial visit note for detailed documentation.   Patient agreed to services and consent obtained.   Assessment: Review of patient past medical history, allergies, medications, and health status, including review of relevant consultants reports was performed today as part of a comprehensive evaluation and provision of chronic care management and care coordination services.     SDOH (Social Determinants of Health) assessments and interventions performed:  SDOH Interventions    Flowsheet Row Most Recent Value  SDOH Interventions   Physical Activity Interventions Other (Comments)  [walking challenges. uses a walker to help her walk]  Stress Interventions Provide Counseling  [client has stress related to managing medical needs]  Depression Interventions/Treatment  Counseling, Medication        Advanced Directives Status: See [employer]yn[employer] application for related entries.  CCM Care Plan  Allergies  Allergen Reactions   Amoxicillin-Pot Clavulanate Other (See Comments)   Biaxin [Clarithromycin] Other (See Comments)    Stomach problems    Erythromycin    Lisinopril Swelling    Outpatient Encounter Medications as of 11/26/2021  Medication Sig   acetaminophen (TYLENOL) 500 MG tablet  Take 1,000 mg by mouth 2 (two) times daily as needed for moderate pain or headache.   acyclovir (ZOVIRAX) 400 MG tablet Take 1 tablet (400 mg total) by mouth 2 (two) times daily.   albuterol (PROVENTIL HFA;VENTOLIN HFA) 108 (90 BASE) MCG/ACT inhaler Inhale 2 puffs into the lungs every 6 (six) hours as needed for shortness of breath.   amLODipine (NORVASC) 10 MG tablet Take 1 tablet (10 mg total) by mouth daily.   Ascorbic Acid 500 MG CHEW Chew by mouth.   aspirin EC 81 MG tablet Take 81 mg by mouth at bedtime.   B Complex Vitamins (VITAMIN B COMPLEX) TABS Take 1 tablet by mouth daily.   bortezomib SQ (VELCADE) 3.5 MG injection Inject into the skin once.   busPIRone (BUSPAR) 5 MG tablet Take 1 tablet (5 mg total) by mouth daily.   Calcium Carbonate-Vitamin D (CALCIUM 600+D PO) Take 1 tablet by mouth 2 (two) times daily.   chlorthalidone (HYGROTON) 25 MG tablet Take 25 mg by mouth daily.    cholecalciferol (VITAMIN D) 25 MCG (1000 UNIT) tablet Take 1,000 Units by mouth daily.   cyanocobalamin 1000 MCG tablet Take 1 tablet by mouth daily.   cyclobenzaprine (FLEXERIL) 10 MG tablet Take 0.5 tablets (5 mg total) by mouth 2 (two) times daily as needed for muscle spasms.   Daratumumab-Hyaluronidase-fihj (DARZALEX FASPRO [place]) Inject into the skin.   diclofenac Sodium (VOLTAREN) 1 % GEL Apply 4 g topically 4 (four) times daily.   fluticasone (FLONASE) 50 MCG/ACT nasal spray USE 2 SPRAYS IN EACH NOSTRIL ONCE DAILY.   fluticasone-salmeterol (ADVAIR) 250-50 MCG/ACT AEPB Inhale 1 puff into the lungs 2 (two) times daily.  folic acid (FOLVITE) 1 MG tablet TAKE 1 TABLET BY MOUTH ONCE DAILY. (Patient taking differently: Take 1 mg by mouth daily.)   gabapentin (NEURONTIN) 600 MG tablet Take 600 mg by mouth 2 (two) times daily.   hydrochlorothiazide (MICROZIDE) 12.5 MG capsule Take 12.5 mg by mouth daily.   hydrOXYzine (VISTARIL) 25 MG capsule    Hyoscyamine Sulfate SL 0.125 MG SUBL    lenalidomide (REVLIMID) 15  MG capsule Take 1 capsule (15 mg total) by mouth daily. 21 days on, 7 days off every 28 days   lidocaine (XYLOCAINE) 2 % solution TAKE 2 TEASPOONFULS BEFORE MEALS AND AT BEDTIME AS NEEDED MAY REPEAT EVERY 4 HOURS. (MAX OF 8 DOSES PER DAY)   Lidocaine HCl (XOLIDO) 2 % CREA Apply 1 application topically daily as needed (arthritisis).   loperamide (IMODIUM) 2 MG capsule    losartan (COZAAR) 100 MG tablet Take 100 mg by mouth daily.   magnesium oxide (MAG-OX) 400 (240 Mg) MG tablet Take 1 tablet (400 mg total) by mouth 2 (two) times daily.   meclizine (ANTIVERT) 25 MG tablet    metoprolol tartrate (LOPRESSOR) 50 MG tablet Take 1 tablet (50 mg total) by mouth 2 (two) times daily.   mirtazapine (REMERON) 15 MG tablet Take 15 mg by mouth at bedtime.   montelukast (SINGULAIR) 10 MG tablet Take 10 mg by mouth daily.    MYRBETRIQ 25 MG TB24 tablet Take 25 mg by mouth daily.   nitroGLYCERIN (NITROSTAT) 0.4 MG SL tablet Place 0.4 mg under the tongue every 5 (five) minutes as needed for chest pain.   ondansetron (ZOFRAN-ODT) 4 MG disintegrating tablet    pantoprazole (PROTONIX) 40 MG tablet Take 40 mg by mouth daily.   potassium chloride (KLOR-CON M) 10 MEQ tablet Take 1 tablet (10 mEq total) by mouth 2 (two) times daily.   potassium chloride SA (KLOR-CON M) 20 MEQ tablet Take 1 tablet (20 mEq total) by mouth 3 (three) times daily.   primidone (MYSOLINE) 50 MG tablet Take 50 mg by mouth daily.   Probiotic Product (GNP PROBIOTIC COLON SUPPORT) CAPS Take 1 capsule by mouth daily.   sertraline (ZOLOFT) 100 MG tablet Take 200 mg by mouth daily.    simvastatin (ZOCOR) 40 MG tablet Take 40 mg by mouth at bedtime.   sucralfate (CARAFATE) 1 GM/10ML suspension    tolterodine (DETROL LA) 4 MG 24 hr capsule    warfarin (COUMADIN) 3 MG tablet Take 3 mg by mouth daily.   No facility-administered encounter medications on file as of 11/26/2021.    Patient Active Problem List   Diagnosis Date Noted   Fall 09/16/2021    Anxiety and depression 06/18/2021   Fatigue 06/04/2021   Abnormal gait 05/06/2021   Amnesia 05/06/2021   Anemia due to chronic blood loss 05/06/2021   Asthma 05/06/2021   Atherosclerotic heart disease of native coronary artery without angina pectoris 05/06/2021   Cardiac pacemaker in situ 05/06/2021   Chronic pain 05/06/2021   Coagulation disorder ([place]) 05/06/2021   Panic disorder 05/06/2021   H/O: hysterectomy 05/06/2021   IgA myeloma ([place]) 05/06/2021   Long term (current) use of anticoagulants 05/06/2021   Major depressive disorder, single episode, unspecified 05/06/2021   Mild intermittent asthma 05/06/2021   Mixed hyperlipidemia 05/06/2021   Monoclonal gammopathy 05/06/2021   Obesity 05/06/2021   Personal history of pulmonary embolism 05/06/2021   Recurrent major depression in remission ([place]) 05/06/2021   Severe recurrent major depression without psychotic features ([employer]) 05/06/2021  Skin sensation disturbance 05/06/2021   Unspecified mononeuropathy of left upper limb 05/06/2021   Urinary incontinence 05/06/2021   Tremors of nervous system 04/13/2021   Left hip pain 04/13/2021   Allergic rhinitis 03/22/2021   Allergic sinusitis 03/22/2021   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   Abdominal pain 02/02/2021   Buttock pain 10/01/2020   Bilateral knee pain 07/15/2020   Immunization due 07/15/2020   Encounter for subsequent annual wellness visit (AWV) in Medicare patient 07/15/2020   Easy bruising 04/30/2020   Wedge compression fracture of first lumbar vertebra, subsequent encounter for fracture with routine healing 04/06/2020   Trapezius muscle spasm 02/27/2020   Body mass index (BMI) 28.0-28.9, adult 01/14/2020   DDD (degenerative disc disease), cervical 01/02/2020   Hoarseness of voice 11/27/2019   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 manage daily needs of client and her spouse. manage anxiety issues of client   Start Date: 07/30/2021  Expected End Date: 02/15/2022  This Visit's Progress: On track  Recent Progress: On track  Priority: Medium  Note:   Current Barriers:  Anxiety issues Mobility issues Caregiver stress issues Suicidal Ideation/Homicidal Ideation: No  Clinical Social Work Goal(s):  patient will work with SW monthly by telephone or in person to reduce or manage symptoms related to anxiety and anxiety issues Patient will talk with SW monthly about caregiver stress issues Patient will attend all scheduled medical appointments for client in next 30 days Patient will call RNCM as needed in next 30 days to discuss nursing needs of client  Interventions: 1:1 collaboration with Renee Rival, FNP regarding development and update of comprehensive plan of care as evidenced by provider attestation and co-signature Discussed with Courtney Rogers her current needs Discussed with Courtney Rogers the needs of her spouse.  She said her spouse has Dementia and it is challenging in caring for needs of her spouse Discussed client recent diagnosis of bone cancer. She said she goes weekly for needed injections and is participating as scheduled in chemotherapy.  She said she has support from her sister, who resides with her and her spouse. Courtney Rogers has support from her daughter who lives nearby. LCSW talked with client about her adjustment to diagnosis and adjusting to cancer treatments. She said she started her Cancer treatments in March of  2023. She said her brother transports her to and from her cancer treatments each week Provided counseling support for client .Used Active Listening Techniques to allow client to discuss her feelings regarding this recent diagnoses.  Encouraged client to communicate as needed with RNCM to discuss nursing needs of client Encouraged client to call LCSW as needed for SW support Discussed mood of client. Courtney Rogers said she  had been a little sad since receiving diagnoses. But, she is talking with her children and talking with her sister about situation and trying to plan for her upcoming treatments. She said that overall she thought her mood was stable. She has family support  and this is very helpful She did say that she is fatigued after treatment each week and may have to rest at home after treatments. Reviewed appetite of client. Reviewed sleeping issues of client. Reviewed pain issues of client Discussed relaxation techniques of client. She likes to play games with family or friends. She likes to do puzzles, she likes to read the Bible as well as read other materials.  Patient Self Care Activities:  Attends all scheduled provider appointments Performs ADL's independently  Patient Coping Strengths:  Family Friends  Patient Self Care Deficits:  Walking challenges (she uses a walker to help her walk) Caregiver stress issues Some anxiety issues  Patient Goals:  - spend time or talk with others at least 2 to 3 times per week - practice relaxation or meditation daily - keep a calendar with appointment dates  Follow Up Plan: LCSW to call client on 01/07/22 at 1:00 PM     Norva Riffle.Edythe Riches MSW, [place] [facility] Care Management [phone]

## 2021-11-26 NOTE — Patient Instructions (Addendum)
Visit Information  Patient Goals:   Manage Anxiety issues.  Manage Caregiver Stress issues  Time Frame:  Short Term Goal Priority; Medium  Progress;  On Track  Start Date:  07/23/21   Expected End Date:  02/14/22      Follow up Date:  01/07/22 at 1:00 PM    Manage Anxiety issues. Manage Caregiver Stress issues  Patient Self Care Activities:  Attends all scheduled provider appointments Performs ADL's independently  Patient Coping Strengths:  Family Friends  Patient Self Care Deficits:  Walking challenges Caregiver stress issues Sleeping challenges  Patient Goals:  - spend time or talk with others at least 2 to 3 times per week - practice relaxation or meditation daily - keep a calendar with appointment dates  Follow Up Plan: LCSW to call client on 01/07/22 at 1;00 PM   Norva Riffle.Makeisha Jentsch MSW, [place] Holiday representative [facility] Care Management [phone]

## 2021-11-30 ENCOUNTER — Inpatient Hospital Stay (HOSPITAL_COMMUNITY): Payer: Medicare Other | Attending: Hematology

## 2021-11-30 ENCOUNTER — Inpatient Hospital Stay (HOSPITAL_COMMUNITY): Payer: Medicare Other

## 2021-11-30 ENCOUNTER — Encounter (HOSPITAL_COMMUNITY): Payer: Self-pay

## 2021-11-30 VITALS — BP 126/75 | HR 88 | Temp 97.3°F | Resp 18

## 2021-11-30 DIAGNOSIS — C9000 Multiple myeloma not having achieved remission: Secondary | ICD-10-CM | POA: Diagnosis present

## 2021-11-30 DIAGNOSIS — Z79899 Other long term (current) drug therapy: Secondary | ICD-10-CM | POA: Diagnosis not present

## 2021-11-30 DIAGNOSIS — Z5112 Encounter for antineoplastic immunotherapy: Secondary | ICD-10-CM | POA: Diagnosis present

## 2021-11-30 LAB — COMPREHENSIVE METABOLIC PANEL
ALT: 19 U/L (ref 0–44)
AST: 22 U/L (ref 15–41)
Albumin: 4.2 g/dL (ref 3.5–5.0)
Alkaline Phosphatase: 51 U/L (ref 38–126)
Anion gap: 3 — ABNORMAL LOW (ref 5–15)
BUN: 15 mg/dL (ref 8–23)
CO2: 29 mmol/L (ref 22–32)
Calcium: 8.4 mg/dL — ABNORMAL LOW (ref 8.9–10.3)
Chloride: 105 mmol/L (ref 98–111)
Creatinine, Ser: 0.75 mg/dL (ref 0.44–1.00)
GFR, Estimated: >60 mL/min (ref >60)
Glucose, Bld: 98 mg/dL (ref 70–99)
Potassium: 3.5 mmol/L (ref 3.5–5.1)
Sodium: 137 mmol/L (ref 135–145)
Total Bilirubin: 0.7 mg/dL (ref 0.3–1.2)
Total Protein: 6.6 g/dL (ref 6.5–8.1)

## 2021-11-30 LAB — CBC WITH DIFFERENTIAL/PLATELET
Abs Immature Granulocytes: 0 10*3/uL (ref 0.00–0.07)
Basophils Absolute: 0.1 10*3/uL (ref 0.0–0.1)
Basophils Relative: 3 %
Eosinophils Absolute: 0.2 10*3/uL (ref 0.0–0.5)
Eosinophils Relative: 9 %
HCT: 35.4 % — ABNORMAL LOW (ref 36.0–46.0)
Hemoglobin: 11.5 g/dL — ABNORMAL LOW (ref 12.0–15.0)
Immature Granulocytes: 0 %
Lymphocytes Relative: 26 %
Lymphs Abs: 0.7 10*3/uL (ref 0.7–4.0)
MCH: 32.3 pg (ref 26.0–34.0)
MCHC: 32.5 g/dL (ref 30.0–36.0)
MCV: 99.4 fL (ref 80.0–100.0)
Monocytes Absolute: 0.4 10*3/uL (ref 0.1–1.0)
Monocytes Relative: 14 %
Neutro Abs: 1.3 10*3/uL — ABNORMAL LOW (ref 1.7–7.7)
Neutrophils Relative %: 48 %
Platelets: 114 10*3/uL — ABNORMAL LOW (ref 150–400)
RBC: 3.56 MIL/uL — ABNORMAL LOW (ref 3.87–5.11)
RDW: 16.3 % — ABNORMAL HIGH (ref 11.5–15.5)
WBC: 2.7 10*3/uL — ABNORMAL LOW (ref 4.0–10.5)
nRBC: 0 % (ref 0.0–0.2)

## 2021-11-30 LAB — MAGNESIUM: Magnesium: 2.1 mg/dL (ref 1.7–2.4)

## 2021-11-30 MED ORDER — ACETAMINOPHEN 325 MG PO TABS
650.0000 mg | ORAL_TABLET | Freq: Once | ORAL | Status: AC
  Administered 2021-11-30: 650 mg via ORAL
  Filled 2021-11-30: qty 2

## 2021-11-30 MED ORDER — DEXAMETHASONE 4 MG PO TABS
20.0000 mg | ORAL_TABLET | Freq: Once | ORAL | Status: AC
  Administered 2021-11-30: 20 mg via ORAL
  Filled 2021-11-30: qty 5

## 2021-11-30 MED ORDER — BORTEZOMIB CHEMO SQ INJECTION 3.5 MG (2.5MG/ML)
1.0000 mg/m2 | Freq: Once | INTRAMUSCULAR | Status: AC
  Administered 2021-11-30: 1.75 mg via SUBCUTANEOUS
  Filled 2021-11-30: qty 0.7

## 2021-11-30 MED ORDER — DARATUMUMAB-HYALURONIDASE-FIHJ 1800-30000 MG-UT/15ML ~~LOC~~ SOLN
1800.0000 mg | Freq: Once | SUBCUTANEOUS | Status: AC
  Administered 2021-11-30: 1800 mg via SUBCUTANEOUS
  Filled 2021-11-30: qty 15

## 2021-11-30 MED ORDER — DIPHENHYDRAMINE HCL 25 MG PO CAPS
25.0000 mg | ORAL_CAPSULE | Freq: Once | ORAL | Status: AC
  Administered 2021-11-30: 25 mg via ORAL
  Filled 2021-11-30: qty 1

## 2021-11-30 NOTE — Patient Instructions (Signed)
[place]  Discharge Instructions: Thank you for choosing [facility] to provide your oncology and hematology care.  If you have a lab appointment with the [facility], please come in thru the Main Entrance and check in at the main information desk.  Wear comfortable clothing and clothing appropriate for easy access to any Portacath or PICC line.   We strive to give you quality time with your provider. You may need to reschedule your appointment if you arrive late (15 or more minutes).  Arriving late affects you and other patients whose appointments are after yours.  Also, if you miss three or more appointments without notifying the office, you may be dismissed from the clinic at the provider's discretion.      For prescription refill requests, have your pharmacy contact our office and allow 72 hours for refills to be completed.    Today you received the following chemotherapy and/or immunotherapy agents darzalex, velcade.   Bortezomib injection What is this medication? BORTEZOMIB (bor TEZ oh mib) targets proteins in cancer cells and stops the cancer cells from growing. It treats multiple myeloma and mantle cell lymphoma. This medicine may be used for other purposes; ask your health care provider or pharmacist if you have questions. COMMON BRAND NAME(S): Velcade What should I tell my care team before I take this medication? They need to know if you have any of these conditions: dehydration diabetes (high blood sugar) heart disease liver disease tingling of the fingers or toes or other nerve disorder an unusual or allergic reaction to bortezomib, mannitol, boron, other medicines, foods, dyes, or preservatives pregnant or trying to get pregnant breast-feeding How should I use this medication? This medicine is injected into a vein or under the skin. It is given by a health care provider in a hospital or clinic setting. Talk to your health care provider about  the use of this medicine in children. Special care may be needed. Overdosage: If you think you have taken too much of this medicine contact a poison control center or emergency room at once. NOTE: This medicine is only for you. Do not share this medicine with others. What if I miss a dose? Keep appointments for follow-up doses. It is important not to miss your dose. Call your health care provider if you are unable to keep an appointment. What may interact with this medication? This medicine may interact with the following medications: ketoconazole rifampin This list may not describe all possible interactions. Give your health care provider a list of all the medicines, herbs, non-prescription drugs, or dietary supplements you use. Also tell them if you smoke, drink alcohol, or use illegal drugs. Some items may interact with your medicine. What should I watch for while using this medication? Your condition will be monitored carefully while you are receiving this medicine. You may need blood work done while you are taking this medicine. You may get drowsy or dizzy. Do not drive, use machinery, or do anything that needs mental alertness until you know how this medicine affects you. Do not stand up or sit up quickly, especially if you are an older patient. This reduces the risk of dizzy or fainting spells This medicine may increase your risk of getting an infection. Call your health care provider for advice if you get a fever, chills, sore throat, or other symptoms of a cold or flu. Do not treat yourself. Try to avoid being around people who are sick. Check with your health  care provider if you have severe diarrhea, nausea, and vomiting, or if you sweat a lot. The loss of too much body fluid may make it dangerous for you to take this medicine. Do not become pregnant while taking this medicine or for 7 months after stopping it. Women should inform their health care provider if they wish to become pregnant  or think they might be pregnant. Men should not father a child while taking this medicine and for 4 months after stopping it. There is a potential for serious harm to an unborn child. Talk to your health care provider for more information. Do not breast-feed an infant while taking this medicine or for 2 months after stopping it. This medicine may make it more difficult to get pregnant or father a child. Talk to your health care provider if you are concerned about your fertility. What side effects may I notice from receiving this medication? Side effects that you should report to your doctor or health care professional as soon as possible: allergic reactions (skin rash; itching or hives; swelling of the face, lips, or tongue) bleeding (bloody or black, tarry stools; red or dark brown urine; spitting up blood or brown material that looks like coffee grounds; red spots on the skin; unusual bruising or bleeding from the eye, gums, or nose) blurred vision or changes in vision confusion constipation headache heart failure (trouble breathing; fast, irregular heartbeat; sudden weight gain; swelling of the ankles, feet, hands) infection (fever, chills, cough, sore throat, pain or trouble passing urine) lack or loss of appetite liver injury (dark yellow or brown urine; general ill feeling or flu-like symptoms; loss of appetite, right upper belly pain; yellowing of the eyes or skin) low blood pressure (dizziness; feeling faint or lightheaded, falls; unusually weak or tired) muscle cramps pain, redness, or irritation at site where injected pain, tingling, numbness in the hands or feet seizures trouble breathing unusual bruising or bleeding Side effects that usually do not require medical attention (report to your doctor or health care professional if they continue or are bothersome): diarrhea nausea stomach pain trouble sleeping vomiting This list may not describe all possible side effects. Call your  doctor for medical advice about side effects. You may report side effects to FDA at 1-800-FDA-1088. Where should I keep my medication? This medicine is given in a hospital or clinic. It will not be stored at home. NOTE: This sheet is a summary. It may not cover all possible information. If you have questions about this medicine, talk to your doctor, pharmacist, or health care provider.  2023 Elsevier/Gold Standard (2020-04-30 00:00:00) Daratumumab injection What is this medication? DARATUMUMAB (dar a toom ue mab) is a monoclonal antibody. It is used to treat multiple myeloma. This medicine may be used for other purposes; ask your health care provider or pharmacist if you have questions. COMMON BRAND NAME(S): DARZALEX What should I tell my care team before I take this medication? They need to know if you have any of these conditions: hereditary fructose intolerance infection (especially a virus infection such as chickenpox, herpes, or hepatitis B virus) lung or breathing disease (asthma, COPD) an unusual or allergic reaction to daratumumab, sorbitol, other medicines, foods, dyes, or preservatives pregnant or trying to get pregnant breast-feeding How should I use this medication? This medicine is for infusion into a vein. It is given by a health care professional in a hospital or clinic setting. Talk to your pediatrician regarding the use of this medicine in children. Special care  may be needed. Overdosage: If you think you have taken too much of this medicine contact a poison control center or emergency room at once. NOTE: This medicine is only for you. Do not share this medicine with others. What if I miss a dose? Keep appointments for follow-up doses as directed. It is important not to miss your dose. Call your doctor or health care professional if you are unable to keep an appointment. What may interact with this medication? Interactions have not been studied. This list may not describe  all possible interactions. Give your health care provider a list of all the medicines, herbs, non-prescription drugs, or dietary supplements you use. Also tell them if you smoke, drink alcohol, or use illegal drugs. Some items may interact with your medicine. What should I watch for while using this medication? Your condition will be monitored carefully while you are receiving this medicine. This medicine can cause serious allergic reactions. To reduce your risk, your health care provider may give you other medicine to take before receiving this one. Be sure to follow the directions from your health care provider. This medicine can affect the results of blood tests to match your blood type. These changes can last for up to 6 months after the final dose. Your healthcare provider will do blood tests to match your blood type before you start treatment. Tell all of your healthcare providers that you are being treated with this medicine before receiving a blood transfusion. This medicine can affect the results of some tests used to determine treatment response; extra tests may be needed to evaluate response. Do not become pregnant while taking this medicine or for 3 months after stopping it. Women should inform their health care provider if they wish to become pregnant or think they might be pregnant. There is a potential for serious side effects to an unborn child. Talk to your health care provider for more information. Do not breast-feed an infant while taking this medicine. What side effects may I notice from receiving this medication? Side effects that you should report to your care team as soon as possible: Allergic reactions--skin rash, itching or hives, swelling of the face, lips, or tongue Blurred vision Infection--fever, chills, cough, sore throat, pain or trouble passing urine Infusion reactions--dizziness, fast heartbeat, feeling faint or lightheaded, falls, headache, increase in blood pressure,  nausea, vomiting, or wheezing or trouble breathing with loud or whistling sounds Unusual bleeding or bruising Side effects that usually do not require medical attention (report to your care team if they continue or are bothersome): Constipation Diarrhea Pain, tingling, numbness in the hands or feet Swelling of the ankles, feet, hands Tiredness This list may not describe all possible side effects. Call your doctor for medical advice about side effects. You may report side effects to FDA at 1-800-FDA-1088. Where should I keep my medication? This drug is given in a hospital or clinic and will not be stored at home. NOTE: This sheet is a summary. It may not cover all possible information. If you have questions about this medicine, talk to your doctor, pharmacist, or health care provider.  2023 Elsevier/Gold Standard (2020-10-15 00:00:00)       To help prevent nausea and vomiting after your treatment, we encourage you to take your nausea medication as directed.  BELOW ARE SYMPTOMS THAT SHOULD BE REPORTED IMMEDIATELY: *FEVER GREATER THAN 100.4 F (38 C) OR HIGHER *CHILLS OR SWEATING *NAUSEA AND VOMITING THAT IS NOT CONTROLLED WITH YOUR NAUSEA MEDICATION *UNUSUAL SHORTNESS OF  BREATH *UNUSUAL BRUISING OR BLEEDING *URINARY PROBLEMS (pain or burning when urinating, or frequent urination) *BOWEL PROBLEMS (unusual diarrhea, constipation, pain near the anus) TENDERNESS IN MOUTH AND THROAT WITH OR WITHOUT PRESENCE OF ULCERS (sore throat, sores in mouth, or a toothache) UNUSUAL RASH, SWELLING OR PAIN  UNUSUAL VAGINAL DISCHARGE OR ITCHING   Items with * indicate a potential emergency and should be followed up as soon as possible or go to the Emergency Department if any problems should occur.  Please show the CHEMOTHERAPY ALERT CARD or IMMUNOTHERAPY ALERT CARD at check-in to the Emergency Department and triage nurse.  Should you have questions after your visit or need to cancel or reschedule your  appointment, please contact [facility] [phone]  and follow the prompts.  Office hours are 8:00 a.m. to 4:30 p.m. Monday - Friday. Please note that voicemails left after 4:00 p.m. may not be returned until the following business day.  We are closed weekends and major holidays. You have access to a nurse at all times for urgent questions. Please call the main number to the clinic [phone] and follow the prompts.  For any non-urgent questions, you may also contact your provider using MyChart. We now offer e-Visits for anyone 51 and older to request care online for non-urgent symptoms. For details visit [email or web address].   Also download the MyChart app! Go to the app store, search "MyChart", open the app, select , and log in with your MyChart username and password.  Masks are optional in the cancer centers. If you would like for your care team to wear a mask while they are taking care of you, please let them know. For doctor visits, patients may have with them one support person who is at least 73 years old. At this time, visitors are not allowed in the infusion area.

## 2021-11-30 NOTE — Progress Notes (Signed)
ANC 1.3 today.  Oncologist aware and ok to treat per Dr. Delton Coombes.   Patient tolerated Velcade injection with no complaints voiced.  Lab work reviewed.  See MAR for details.  Injection site clean and dry with no bruising or swelling noted.  Patient stable during and after injection.  Band aid applied.  VSS.  Patient left in satisfactory condition with no s/s of distress noted.

## 2021-12-01 NOTE — Telephone Encounter (Signed)
Spoke with patient; faxed request to previous orthopaedist Dr Byrd Hesselbach, Angelina Sheriff (see notes in referral from [facility] Primary care)-notes entered into referral. Patient aware of status and that records and CD, which she has dropped off, are reviewed regarding scheduling - 2nd opinion evaluation requested.

## 2021-12-03 ENCOUNTER — Inpatient Hospital Stay (HOSPITAL_COMMUNITY): Payer: Medicare Other

## 2021-12-03 ENCOUNTER — Encounter (HOSPITAL_COMMUNITY): Payer: Self-pay

## 2021-12-03 VITALS — BP 134/78 | HR 91 | Temp 97.4°F | Resp 18

## 2021-12-03 DIAGNOSIS — C9000 Multiple myeloma not having achieved remission: Secondary | ICD-10-CM

## 2021-12-03 DIAGNOSIS — Z5112 Encounter for antineoplastic immunotherapy: Secondary | ICD-10-CM | POA: Diagnosis not present

## 2021-12-03 MED ORDER — PROCHLORPERAZINE MALEATE 10 MG PO TABS
10.0000 mg | ORAL_TABLET | Freq: Once | ORAL | Status: AC
  Administered 2021-12-03: 10 mg via ORAL
  Filled 2021-12-03: qty 1

## 2021-12-03 MED ORDER — BORTEZOMIB CHEMO SQ INJECTION 3.5 MG (2.5MG/ML)
1.0000 mg/m2 | Freq: Once | INTRAMUSCULAR | Status: AC
  Administered 2021-12-03: 1.75 mg via SUBCUTANEOUS
  Filled 2021-12-03: qty 0.7

## 2021-12-03 NOTE — Patient Instructions (Signed)
[facility]  Discharge Instructions: Thank you for choosing [facility] to provide your oncology and hematology care.  If you have a lab appointment with the Cancer Center, please come in thru the Main Entrance and check in at the main information desk.  Wear comfortable clothing and clothing appropriate for easy access to any Portacath or PICC line.   We strive to give you quality time with your provider. You may need to reschedule your appointment if you arrive late (15 or more minutes).  Arriving late affects you and other patients whose appointments are after yours.  Also, if you miss three or more appointments without notifying the office, you may be dismissed from the clinic at the provider's discretion.      For prescription refill requests, have your pharmacy contact our office and allow 72 hours for refills to be completed.    Today you received the following chemotherapy and/or immunotherapy agents velcade.  Bortezomib injection What is this medication? BORTEZOMIB (bor TEZ oh mib) targets proteins in cancer cells and stops the cancer cells from growing. It treats multiple myeloma and mantle cell lymphoma. This medicine may be used for other purposes; ask your health care provider or pharmacist if you have questions. COMMON BRAND NAME(S): Velcade What should I tell my care team before I take this medication? They need to know if you have any of these conditions: dehydration diabetes (high blood sugar) heart disease liver disease tingling of the fingers or toes or other nerve disorder an unusual or allergic reaction to bortezomib, mannitol, boron, other medicines, foods, dyes, or preservatives pregnant or trying to get pregnant breast-feeding How should I use this medication? This medicine is injected into a vein or under the skin. It is given by a health care provider in a hospital or clinic setting. Talk to your health care provider about the use of  this medicine in children. Special care may be needed. Overdosage: If you think you have taken too much of this medicine contact a poison control center or emergency room at once. NOTE: This medicine is only for you. Do not share this medicine with others. What if I miss a dose? Keep appointments for follow-up doses. It is important not to miss your dose. Call your health care provider if you are unable to keep an appointment. What may interact with this medication? This medicine may interact with the following medications: ketoconazole rifampin This list may not describe all possible interactions. Give your health care provider a list of all the medicines, herbs, non-prescription drugs, or dietary supplements you use. Also tell them if you smoke, drink alcohol, or use illegal drugs. Some items may interact with your medicine. What should I watch for while using this medication? Your condition will be monitored carefully while you are receiving this medicine. You may need blood work done while you are taking this medicine. You may get drowsy or dizzy. Do not drive, use machinery, or do anything that needs mental alertness until you know how this medicine affects you. Do not stand up or sit up quickly, especially if you are an older patient. This reduces the risk of dizzy or fainting spells This medicine may increase your risk of getting an infection. Call your health care provider for advice if you get a fever, chills, sore throat, or other symptoms of a cold or flu. Do not treat yourself. Try to avoid being around people who are sick. Check with your health care provider   if you have severe diarrhea, nausea, and vomiting, or if you sweat a lot. The loss of too much body fluid may make it dangerous for you to take this medicine. Do not become pregnant while taking this medicine or for 7 months after stopping it. Women should inform their health care provider if they wish to become pregnant or think  they might be pregnant. Men should not father a child while taking this medicine and for 4 months after stopping it. There is a potential for serious harm to an unborn child. Talk to your health care provider for more information. Do not breast-feed an infant while taking this medicine or for 2 months after stopping it. This medicine may make it more difficult to get pregnant or father a child. Talk to your health care provider if you are concerned about your fertility. What side effects may I notice from receiving this medication? Side effects that you should report to your doctor or health care professional as soon as possible: allergic reactions (skin rash; itching or hives; swelling of the face, lips, or tongue) bleeding (bloody or black, tarry stools; red or dark brown urine; spitting up blood or brown material that looks like coffee grounds; red spots on the skin; unusual bruising or bleeding from the eye, gums, or nose) blurred vision or changes in vision confusion constipation headache heart failure (trouble breathing; fast, irregular heartbeat; sudden weight gain; swelling of the ankles, feet, hands) infection (fever, chills, cough, sore throat, pain or trouble passing urine) lack or loss of appetite liver injury (dark yellow or brown urine; general ill feeling or flu-like symptoms; loss of appetite, right upper belly pain; yellowing of the eyes or skin) low blood pressure (dizziness; feeling faint or lightheaded, falls; unusually weak or tired) muscle cramps pain, redness, or irritation at site where injected pain, tingling, numbness in the hands or feet seizures trouble breathing unusual bruising or bleeding Side effects that usually do not require medical attention (report to your doctor or health care professional if they continue or are bothersome): diarrhea nausea stomach pain trouble sleeping vomiting This list may not describe all possible side effects. Call your doctor  for medical advice about side effects. You may report side effects to FDA at 1-800-FDA-1088. Where should I keep my medication? This medicine is given in a hospital or clinic. It will not be stored at home. NOTE: This sheet is a summary. It may not cover all possible information. If you have questions about this medicine, talk to your doctor, pharmacist, or health care provider.  2023 Elsevier/Gold Standard (2020-04-30 00:00:00)       To help prevent nausea and vomiting after your treatment, we encourage you to take your nausea medication as directed.  BELOW ARE SYMPTOMS THAT SHOULD BE REPORTED IMMEDIATELY: *FEVER GREATER THAN 100.4 F (38 C) OR HIGHER *CHILLS OR SWEATING *NAUSEA AND VOMITING THAT IS NOT CONTROLLED WITH YOUR NAUSEA MEDICATION *UNUSUAL SHORTNESS OF BREATH *UNUSUAL BRUISING OR BLEEDING *URINARY PROBLEMS (pain or burning when urinating, or frequent urination) *BOWEL PROBLEMS (unusual diarrhea, constipation, pain near the anus) TENDERNESS IN MOUTH AND THROAT WITH OR WITHOUT PRESENCE OF ULCERS (sore throat, sores in mouth, or a toothache) UNUSUAL RASH, SWELLING OR PAIN  UNUSUAL VAGINAL DISCHARGE OR ITCHING   Items with * indicate a potential emergency and should be followed up as soon as possible or go to the Emergency Department if any problems should occur.  Please show the CHEMOTHERAPY ALERT CARD or IMMUNOTHERAPY ALERT CARD at check-in   to the Emergency Department and triage nurse.  Should you have questions after your visit or need to cancel or reschedule your appointment, please contact [facility] [phone]  and follow the prompts.  Office hours are 8:00 a.m. to 4:30 p.m. Monday - Friday. Please note that voicemails left after 4:00 p.m. may not be returned until the following business day.  We are closed weekends and major holidays. You have access to a nurse at all times for urgent questions. Please call the main number to the clinic [phone] and follow  the prompts.  For any non-urgent questions, you may also contact your provider using MyChart. We now offer e-Visits for anyone 18 and older to request care online for non-urgent symptoms. For details visit [email or web address].   Also download the MyChart app! Go to the app store, search "MyChart", open the app, select [place] [facility], and log in with your MyChart username and password.  Masks are optional in the cancer centers. If you would like for your care team to wear a mask while they are taking care of you, please let them know. For doctor visits, patients may have with them one support person who is at least 73 years old. At this time, visitors are not allowed in the infusion area.  

## 2021-12-03 NOTE — Progress Notes (Signed)
Patient tolerated Velcade injection with no complaints voiced. Lab work reviewed. See MAR for details. Injection site clean and dry with no bruising or swelling noted. Patient stable during and after injection. Band aid applied. VSS. Patient left in satisfactory condition with no s/s of distress noted. 

## 2021-12-07 ENCOUNTER — Inpatient Hospital Stay (HOSPITAL_COMMUNITY): Payer: Medicare Other

## 2021-12-07 ENCOUNTER — Encounter (HOSPITAL_COMMUNITY): Payer: Self-pay

## 2021-12-07 VITALS — BP 108/63 | HR 87 | Temp 98.7°F | Resp 18 | Wt 160.4 lb

## 2021-12-07 DIAGNOSIS — Z5112 Encounter for antineoplastic immunotherapy: Secondary | ICD-10-CM | POA: Diagnosis not present

## 2021-12-07 DIAGNOSIS — C9000 Multiple myeloma not having achieved remission: Secondary | ICD-10-CM

## 2021-12-07 LAB — CBC WITH DIFFERENTIAL/PLATELET
Abs Immature Granulocytes: 0 10*3/uL (ref 0.00–0.07)
Basophils Absolute: 0.1 10*3/uL (ref 0.0–0.1)
Basophils Relative: 3 %
Eosinophils Absolute: 0.4 10*3/uL (ref 0.0–0.5)
Eosinophils Relative: 13 %
HCT: 35 % — ABNORMAL LOW (ref 36.0–46.0)
Hemoglobin: 11.4 g/dL — ABNORMAL LOW (ref 12.0–15.0)
Immature Granulocytes: 0 %
Lymphocytes Relative: 26 %
Lymphs Abs: 0.8 10*3/uL (ref 0.7–4.0)
MCH: 32.8 pg (ref 26.0–34.0)
MCHC: 32.6 g/dL (ref 30.0–36.0)
MCV: 100.6 fL — ABNORMAL HIGH (ref 80.0–100.0)
Monocytes Absolute: 0.3 10*3/uL (ref 0.1–1.0)
Monocytes Relative: 11 %
Neutro Abs: 1.4 10*3/uL — ABNORMAL LOW (ref 1.7–7.7)
Neutrophils Relative %: 47 %
Platelets: 100 10*3/uL — ABNORMAL LOW (ref 150–400)
RBC: 3.48 MIL/uL — ABNORMAL LOW (ref 3.87–5.11)
RDW: 16.7 % — ABNORMAL HIGH (ref 11.5–15.5)
WBC: 2.9 10*3/uL — ABNORMAL LOW (ref 4.0–10.5)
nRBC: 0 % (ref 0.0–0.2)

## 2021-12-07 LAB — COMPREHENSIVE METABOLIC PANEL
ALT: 20 U/L (ref 0–44)
AST: 21 U/L (ref 15–41)
Albumin: 4.1 g/dL (ref 3.5–5.0)
Alkaline Phosphatase: 53 U/L (ref 38–126)
Anion gap: 7 (ref 5–15)
BUN: 16 mg/dL (ref 8–23)
CO2: 29 mmol/L (ref 22–32)
Calcium: 8.9 mg/dL (ref 8.9–10.3)
Chloride: 104 mmol/L (ref 98–111)
Creatinine, Ser: 0.71 mg/dL (ref 0.44–1.00)
GFR, Estimated: >60 mL/min (ref >60)
Glucose, Bld: 88 mg/dL (ref 70–99)
Potassium: 3.6 mmol/L (ref 3.5–5.1)
Sodium: 140 mmol/L (ref 135–145)
Total Bilirubin: 0.7 mg/dL (ref 0.3–1.2)
Total Protein: 6.6 g/dL (ref 6.5–8.1)

## 2021-12-07 LAB — MAGNESIUM: Magnesium: 1.9 mg/dL (ref 1.7–2.4)

## 2021-12-07 MED ORDER — DEXAMETHASONE 4 MG PO TABS
20.0000 mg | ORAL_TABLET | Freq: Once | ORAL | Status: AC
  Administered 2021-12-07: 20 mg via ORAL
  Filled 2021-12-07: qty 5

## 2021-12-07 MED ORDER — DIPHENHYDRAMINE HCL 25 MG PO CAPS
25.0000 mg | ORAL_CAPSULE | Freq: Once | ORAL | Status: AC
  Administered 2021-12-07: 25 mg via ORAL
  Filled 2021-12-07: qty 1

## 2021-12-07 MED ORDER — BORTEZOMIB CHEMO SQ INJECTION 3.5 MG (2.5MG/ML)
1.0000 mg/m2 | Freq: Once | INTRAMUSCULAR | Status: AC
  Administered 2021-12-07: 1.75 mg via SUBCUTANEOUS
  Filled 2021-12-07: qty 0.7

## 2021-12-07 MED ORDER — DARATUMUMAB-HYALURONIDASE-FIHJ 1800-30000 MG-UT/15ML ~~LOC~~ SOLN
1800.0000 mg | Freq: Once | SUBCUTANEOUS | Status: AC
  Administered 2021-12-07: 1800 mg via SUBCUTANEOUS
  Filled 2021-12-07: qty 15

## 2021-12-07 MED ORDER — ACETAMINOPHEN 325 MG PO TABS
650.0000 mg | ORAL_TABLET | Freq: Once | ORAL | Status: AC
  Administered 2021-12-07: 650 mg via ORAL
  Filled 2021-12-07: qty 2

## 2021-12-07 NOTE — Patient Instructions (Signed)
[facility]  Discharge Instructions: Thank you for choosing [facility] to provide your oncology and hematology care.  If you have a lab appointment with the [facility], please come in thru the Main Entrance and check in at the main information desk.  Wear comfortable clothing and clothing appropriate for easy access to any Portacath or PICC line.   We strive to give you quality time with your provider. You may need to reschedule your appointment if you arrive late (15 or more minutes).  Arriving late affects you and other patients whose appointments are after yours.  Also, if you miss three or more appointments without notifying the office, you may be dismissed from the clinic at the provider's discretion.      For prescription refill requests, have your pharmacy contact our office and allow 72 hours for refills to be completed.     Today you received the following chemotherapy and/or immunotherapy agents Velcade/Darzalex Faspro.  Bortezomib injection What is this medication? BORTEZOMIB (bor TEZ oh mib) targets proteins in cancer cells and stops the cancer cells from growing. It treats multiple myeloma and mantle cell lymphoma. This medicine may be used for other purposes; ask your health care provider or pharmacist if you have questions. COMMON BRAND NAME(S): Velcade What should I tell my care team before I take this medication? They need to know if you have any of these conditions: dehydration diabetes (high blood sugar) heart disease liver disease tingling of the fingers or toes or other nerve disorder an unusual or allergic reaction to bortezomib, mannitol, boron, other medicines, foods, dyes, or preservatives pregnant or trying to get pregnant breast-feeding How should I use this medication? This medicine is injected into a vein or under the skin. It is given by a health care provider in a hospital or clinic setting. Talk to your health care provider  about the use of this medicine in children. Special care may be needed. Overdosage: If you think you have taken too much of this medicine contact a poison control center or emergency room at once. NOTE: This medicine is only for you. Do not share this medicine with others. What if I miss a dose? Keep appointments for follow-up doses. It is important not to miss your dose. Call your health care provider if you are unable to keep an appointment. What may interact with this medication? This medicine may interact with the following medications: ketoconazole rifampin This list may not describe all possible interactions. Give your health care provider a list of all the medicines, herbs, non-prescription drugs, or dietary supplements you use. Also tell them if you smoke, drink alcohol, or use illegal drugs. Some items may interact with your medicine. What should I watch for while using this medication? Your condition will be monitored carefully while you are receiving this medicine. You may need blood work done while you are taking this medicine. You may get drowsy or dizzy. Do not drive, use machinery, or do anything that needs mental alertness until you know how this medicine affects you. Do not stand up or sit up quickly, especially if you are an older patient. This reduces the risk of dizzy or fainting spells This medicine may increase your risk of getting an infection. Call your health care provider for advice if you get a fever, chills, sore throat, or other symptoms of a cold or flu. Do not treat yourself. Try to avoid being around people who are sick. Check with your health  care provider if you have severe diarrhea, nausea, and vomiting, or if you sweat a lot. The loss of too much body fluid may make it dangerous for you to take this medicine. Do not become pregnant while taking this medicine or for 7 months after stopping it. Women should inform their health care provider if they wish to become  pregnant or think they might be pregnant. Men should not father a child while taking this medicine and for 4 months after stopping it. There is a potential for serious harm to an unborn child. Talk to your health care provider for more information. Do not breast-feed an infant while taking this medicine or for 2 months after stopping it. This medicine may make it more difficult to get pregnant or father a child. Talk to your health care provider if you are concerned about your fertility. What side effects may I notice from receiving this medication? Side effects that you should report to your doctor or health care professional as soon as possible: allergic reactions (skin rash; itching or hives; swelling of the face, lips, or tongue) bleeding (bloody or black, tarry stools; red or dark brown urine; spitting up blood or brown material that looks like coffee grounds; red spots on the skin; unusual bruising or bleeding from the eye, gums, or nose) blurred vision or changes in vision confusion constipation headache heart failure (trouble breathing; fast, irregular heartbeat; sudden weight gain; swelling of the ankles, feet, hands) infection (fever, chills, cough, sore throat, pain or trouble passing urine) lack or loss of appetite liver injury (dark yellow or brown urine; general ill feeling or flu-like symptoms; loss of appetite, right upper belly pain; yellowing of the eyes or skin) low blood pressure (dizziness; feeling faint or lightheaded, falls; unusually weak or tired) muscle cramps pain, redness, or irritation at site where injected pain, tingling, numbness in the hands or feet seizures trouble breathing unusual bruising or bleeding Side effects that usually do not require medical attention (report to your doctor or health care professional if they continue or are bothersome): diarrhea nausea stomach pain trouble sleeping vomiting This list may not describe all possible side effects.  Call your doctor for medical advice about side effects. You may report side effects to FDA at 1-800-FDA-1088. Where should I keep my medication? This medicine is given in a hospital or clinic. It will not be stored at home. NOTE: This sheet is a summary. It may not cover all possible information. If you have questions about this medicine, talk to your doctor, pharmacist, or health care provider.  2023 Elsevier/Gold Standard (2020-04-30 00:00:00)       To help prevent nausea and vomiting after your treatment, we encourage you to take your nausea medication as directed.  BELOW ARE SYMPTOMS THAT SHOULD BE REPORTED IMMEDIATELY: *FEVER GREATER THAN 100.4 F (38 C) OR HIGHER *CHILLS OR SWEATING *NAUSEA AND VOMITING THAT IS NOT CONTROLLED WITH YOUR NAUSEA MEDICATION *UNUSUAL SHORTNESS OF BREATH *UNUSUAL BRUISING OR BLEEDING *URINARY PROBLEMS (pain or burning when urinating, or frequent urination) *BOWEL PROBLEMS (unusual diarrhea, constipation, pain near the anus) TENDERNESS IN MOUTH AND THROAT WITH OR WITHOUT PRESENCE OF ULCERS (sore throat, sores in mouth, or a toothache) UNUSUAL RASH, SWELLING OR PAIN  UNUSUAL VAGINAL DISCHARGE OR ITCHING   Items with * indicate a potential emergency and should be followed up as soon as possible or go to the Emergency Department if any problems should occur.  Please show the CHEMOTHERAPY ALERT CARD or IMMUNOTHERAPY ALERT CARD   at check-in to the Emergency Department and triage nurse.  Should you have questions after your visit or need to cancel or reschedule your appointment, please contact [facility] [phone]  and follow the prompts.  Office hours are 8:00 a.m. to 4:30 p.m. Monday - Friday. Please note that voicemails left after 4:00 p.m. may not be returned until the following business day.  We are closed weekends and major holidays. You have access to a nurse at all times for urgent questions. Please call the main number to the clinic  [phone] and follow the prompts.  For any non-urgent questions, you may also contact your provider using MyChart. We now offer e-Visits for anyone 18 and older to request care online for non-urgent symptoms. For details visit [email or web address].   Also download the MyChart app! Go to the app store, search "MyChart", open the app, select [facility], and log in with your MyChart username and password.  Masks are optional in the cancer centers. If you would like for your care team to wear a mask while they are taking care of you, please let them know. For doctor visits, patients may have with them one support person who is at least 73 years old. At this time, visitors are not allowed in the infusion area.  

## 2021-12-07 NOTE — Progress Notes (Signed)
Patient presents today for Dara Latty and Velcade injection. ANC 1.4 today. All other labs within parameters for treatment today. Message sent to Dr. Delton Coombes and A. Anderson RN to report labs. Message received to proceed with treatment today. Vital signs within parameters for treatment today.   Treatment given today per MD orders. Tolerated without adverse affects. Vital signs stable. No complaints at this time. Discharged from clinic ambulatory in stable condition. Alert and oriented x 3. F/U with [facility] as scheduled.

## 2021-12-08 LAB — KAPPA/LAMBDA LIGHT CHAINS
Kappa free light chain: 13.8 mg/L (ref 3.3–19.4)
Kappa, lambda light chain ratio: 1.59 (ref 0.26–1.65)
Lambda free light chains: 8.7 mg/L (ref 5.7–26.3)

## 2021-12-10 ENCOUNTER — Encounter (HOSPITAL_COMMUNITY): Payer: Self-pay

## 2021-12-10 ENCOUNTER — Inpatient Hospital Stay (HOSPITAL_COMMUNITY): Payer: Medicare Other

## 2021-12-10 VITALS — BP 96/57 | HR 90 | Temp 98.0°F | Resp 18 | Wt 162.5 lb

## 2021-12-10 DIAGNOSIS — C9000 Multiple myeloma not having achieved remission: Secondary | ICD-10-CM

## 2021-12-10 DIAGNOSIS — Z5112 Encounter for antineoplastic immunotherapy: Secondary | ICD-10-CM | POA: Diagnosis not present

## 2021-12-10 MED ORDER — PROCHLORPERAZINE MALEATE 10 MG PO TABS
10.0000 mg | ORAL_TABLET | Freq: Once | ORAL | Status: AC
  Administered 2021-12-10: 10 mg via ORAL
  Filled 2021-12-10: qty 1

## 2021-12-10 MED ORDER — BORTEZOMIB CHEMO SQ INJECTION 3.5 MG (2.5MG/ML)
1.0000 mg/m2 | Freq: Once | INTRAMUSCULAR | Status: AC
  Administered 2021-12-10: 1.75 mg via SUBCUTANEOUS
  Filled 2021-12-10: qty 0.7

## 2021-12-10 NOTE — Patient Instructions (Signed)
[facility]  Discharge Instructions: Thank you for choosing [facility] to provide your oncology and hematology care.  If you have a lab appointment with the [facility], please come in thru the Main Entrance and check in at the main information desk.  Wear comfortable clothing and clothing appropriate for easy access to any Portacath or PICC line.   We strive to give you quality time with your provider. You may need to reschedule your appointment if you arrive late (15 or more minutes).  Arriving late affects you and other patients whose appointments are after yours.  Also, if you miss three or more appointments without notifying the office, you may be dismissed from the clinic at the provider's discretion.      For prescription refill requests, have your pharmacy contact our office and allow 72 hours for refills to be completed.    Today you received the following chemotherapy and/or immunotherapy agents velcade      To help prevent nausea and vomiting after your treatment, we encourage you to take your nausea medication as directed.  BELOW ARE SYMPTOMS THAT SHOULD BE REPORTED IMMEDIATELY: *FEVER GREATER THAN 100.4 F (38 C) OR HIGHER *CHILLS OR SWEATING *NAUSEA AND VOMITING THAT IS NOT CONTROLLED WITH YOUR NAUSEA MEDICATION *UNUSUAL SHORTNESS OF BREATH *UNUSUAL BRUISING OR BLEEDING *URINARY PROBLEMS (pain or burning when urinating, or frequent urination) *BOWEL PROBLEMS (unusual diarrhea, constipation, pain near the anus) TENDERNESS IN MOUTH AND THROAT WITH OR WITHOUT PRESENCE OF ULCERS (sore throat, sores in mouth, or a toothache) UNUSUAL RASH, SWELLING OR PAIN  UNUSUAL VAGINAL DISCHARGE OR ITCHING   Items with * indicate a potential emergency and should be followed up as soon as possible or go to the Emergency Department if any problems should occur.  Please show the CHEMOTHERAPY ALERT CARD or IMMUNOTHERAPY ALERT CARD at check-in to the Emergency  Department and triage nurse.  Should you have questions after your visit or need to cancel or reschedule your appointment, please contact [facility] [phone]  and follow the prompts.  Office hours are 8:00 a.m. to 4:30 p.m. Monday - Friday. Please note that voicemails left after 4:00 p.m. may not be returned until the following business day.  We are closed weekends and major holidays. You have access to a nurse at all times for urgent questions. Please call the main number to the clinic [phone] and follow the prompts.  For any non-urgent questions, you may also contact your provider using MyChart. We now offer e-Visits for anyone 89 and older to request care online for non-urgent symptoms. For details visit [email or web address].   Also download the MyChart app! Go to the app store, search "MyChart", open the app, select [facility], and log in with your MyChart username and password.  Masks are optional in the cancer centers. If you would like for your care team to wear a mask while they are taking care of you, please let them know. For doctor visits, patients may have with them one support person who is at least 73 years old. At this time, visitors are not allowed in the infusion area. Bortezomib injection What is this medication? BORTEZOMIB (bor TEZ oh mib) targets proteins in cancer cells and stops the cancer cells from growing. It treats multiple myeloma and mantle cell lymphoma. This medicine may be used for other purposes; ask your health care provider or pharmacist if you have questions. COMMON BRAND NAME(S): Velcade What should I tell my care  team before I take this medication? They need to know if you have any of these conditions: dehydration diabetes (high blood sugar) heart disease liver disease tingling of the fingers or toes or other nerve disorder an unusual or allergic reaction to bortezomib, mannitol, boron, other medicines, foods, dyes, or  preservatives pregnant or trying to get pregnant breast-feeding How should I use this medication? This medicine is injected into a vein or under the skin. It is given by a health care provider in a hospital or clinic setting. Talk to your health care provider about the use of this medicine in children. Special care may be needed. Overdosage: If you think you have taken too much of this medicine contact a poison control center or emergency room at once. NOTE: This medicine is only for you. Do not share this medicine with others. What if I miss a dose? Keep appointments for follow-up doses. It is important not to miss your dose. Call your health care provider if you are unable to keep an appointment. What may interact with this medication? This medicine may interact with the following medications: ketoconazole rifampin This list may not describe all possible interactions. Give your health care provider a list of all the medicines, herbs, non-prescription drugs, or dietary supplements you use. Also tell them if you smoke, drink alcohol, or use illegal drugs. Some items may interact with your medicine. What should I watch for while using this medication? Your condition will be monitored carefully while you are receiving this medicine. You may need blood work done while you are taking this medicine. You may get drowsy or dizzy. Do not drive, use machinery, or do anything that needs mental alertness until you know how this medicine affects you. Do not stand up or sit up quickly, especially if you are an older patient. This reduces the risk of dizzy or fainting spells This medicine may increase your risk of getting an infection. Call your health care provider for advice if you get a fever, chills, sore throat, or other symptoms of a cold or flu. Do not treat yourself. Try to avoid being around people who are sick. Check with your health care provider if you have severe diarrhea, nausea, and vomiting, or  if you sweat a lot. The loss of too much body fluid may make it dangerous for you to take this medicine. Do not become pregnant while taking this medicine or for 7 months after stopping it. Women should inform their health care provider if they wish to become pregnant or think they might be pregnant. Men should not father a child while taking this medicine and for 4 months after stopping it. There is a potential for serious harm to an unborn child. Talk to your health care provider for more information. Do not breast-feed an infant while taking this medicine or for 2 months after stopping it. This medicine may make it more difficult to get pregnant or father a child. Talk to your health care provider if you are concerned about your fertility. What side effects may I notice from receiving this medication? Side effects that you should report to your doctor or health care professional as soon as possible: allergic reactions (skin rash; itching or hives; swelling of the face, lips, or tongue) bleeding (bloody or black, tarry stools; red or dark brown urine; spitting up blood or brown material that looks like coffee grounds; red spots on the skin; unusual bruising or bleeding from the eye, gums, or nose) blurred vision  or changes in vision confusion constipation headache heart failure (trouble breathing; fast, irregular heartbeat; sudden weight gain; swelling of the ankles, feet, hands) infection (fever, chills, cough, sore throat, pain or trouble passing urine) lack or loss of appetite liver injury (dark yellow or brown urine; general ill feeling or flu-like symptoms; loss of appetite, right upper belly pain; yellowing of the eyes or skin) low blood pressure (dizziness; feeling faint or lightheaded, falls; unusually weak or tired) muscle cramps pain, redness, or irritation at site where injected pain, tingling, numbness in the hands or feet seizures trouble breathing unusual bruising or  bleeding Side effects that usually do not require medical attention (report to your doctor or health care professional if they continue or are bothersome): diarrhea nausea stomach pain trouble sleeping vomiting This list may not describe all possible side effects. Call your doctor for medical advice about side effects. You may report side effects to FDA at 1-800-FDA-1088. Where should I keep my medication? This medicine is given in a hospital or clinic. It will not be stored at home. NOTE: This sheet is a summary. It may not cover all possible information. If you have questions about this medicine, talk to your doctor, pharmacist, or health care provider.  2023 Elsevier/Gold Standard (2020-04-30 00:00:00)

## 2021-12-10 NOTE — Progress Notes (Signed)
Labs reviewed from 12-07-21, will proceed with treatment per parameters.   Treatment given per orders. Patient tolerated it well without problems. Vitals stable and discharged home from clinic ambulatory. Follow up as scheduled.

## 2021-12-13 ENCOUNTER — Other Ambulatory Visit: Payer: Self-pay

## 2021-12-13 LAB — MISC LABCORP TEST (SEND OUT): Labcorp test code: 123218

## 2021-12-13 LAB — IMMUNOFIXATION ELECTROPHORESIS
IgA: 64 mg/dL (ref 64–422)
IgG (Immunoglobin G), Serum: 469 mg/dL — ABNORMAL LOW (ref 586–1602)
IgM (Immunoglobulin M), Srm: 45 mg/dL (ref 26–217)
Total Protein ELP: 6.2 g/dL (ref 6.0–8.5)

## 2021-12-14 ENCOUNTER — Inpatient Hospital Stay (HOSPITAL_BASED_OUTPATIENT_CLINIC_OR_DEPARTMENT_OTHER): Payer: Medicare Other | Admitting: Hematology

## 2021-12-14 ENCOUNTER — Inpatient Hospital Stay (HOSPITAL_COMMUNITY): Payer: Medicare Other

## 2021-12-14 VITALS — BP 106/64 | HR 86 | Temp 97.5°F | Resp 18 | Ht 63.0 in | Wt 164.2 lb

## 2021-12-14 DIAGNOSIS — D509 Iron deficiency anemia, unspecified: Secondary | ICD-10-CM

## 2021-12-14 DIAGNOSIS — C9000 Multiple myeloma not having achieved remission: Secondary | ICD-10-CM

## 2021-12-14 DIAGNOSIS — Z5112 Encounter for antineoplastic immunotherapy: Secondary | ICD-10-CM | POA: Diagnosis not present

## 2021-12-14 LAB — CBC WITH DIFFERENTIAL/PLATELET
Abs Immature Granulocytes: 0.02 10*3/uL (ref 0.00–0.07)
Basophils Absolute: 0 10*3/uL (ref 0.0–0.1)
Basophils Relative: 1 %
Eosinophils Absolute: 0.3 10*3/uL (ref 0.0–0.5)
Eosinophils Relative: 6 %
HCT: 35.8 % — ABNORMAL LOW (ref 36.0–46.0)
Hemoglobin: 11.4 g/dL — ABNORMAL LOW (ref 12.0–15.0)
Immature Granulocytes: 0 %
Lymphocytes Relative: 17 %
Lymphs Abs: 0.8 10*3/uL (ref 0.7–4.0)
MCH: 32.3 pg (ref 26.0–34.0)
MCHC: 31.8 g/dL (ref 30.0–36.0)
MCV: 101.4 fL — ABNORMAL HIGH (ref 80.0–100.0)
Monocytes Absolute: 0.9 10*3/uL (ref 0.1–1.0)
Monocytes Relative: 19 %
Neutro Abs: 2.6 10*3/uL (ref 1.7–7.7)
Neutrophils Relative %: 57 %
Platelets: 67 10*3/uL — ABNORMAL LOW (ref 150–400)
RBC: 3.53 MIL/uL — ABNORMAL LOW (ref 3.87–5.11)
RDW: 16.4 % — ABNORMAL HIGH (ref 11.5–15.5)
WBC: 4.6 10*3/uL (ref 4.0–10.5)
nRBC: 0 % (ref 0.0–0.2)

## 2021-12-14 LAB — COMPREHENSIVE METABOLIC PANEL
ALT: 21 U/L (ref 0–44)
AST: 19 U/L (ref 15–41)
Albumin: 3.9 g/dL (ref 3.5–5.0)
Alkaline Phosphatase: 58 U/L (ref 38–126)
Anion gap: 6 (ref 5–15)
BUN: 16 mg/dL (ref 8–23)
CO2: 29 mmol/L (ref 22–32)
Calcium: 8 mg/dL — ABNORMAL LOW (ref 8.9–10.3)
Chloride: 107 mmol/L (ref 98–111)
Creatinine, Ser: 0.89 mg/dL (ref 0.44–1.00)
GFR, Estimated: >60 mL/min (ref >60)
Glucose, Bld: 79 mg/dL (ref 70–99)
Potassium: 3.9 mmol/L (ref 3.5–5.1)
Sodium: 142 mmol/L (ref 135–145)
Total Bilirubin: 0.6 mg/dL (ref 0.3–1.2)
Total Protein: 6.4 g/dL — ABNORMAL LOW (ref 6.5–8.1)

## 2021-12-14 LAB — MAGNESIUM: Magnesium: 2 mg/dL (ref 1.7–2.4)

## 2021-12-14 LAB — LACTATE DEHYDROGENASE: LDH: 121 U/L (ref 98–192)

## 2021-12-14 MED ORDER — DEXAMETHASONE 4 MG PO TABS
20.0000 mg | ORAL_TABLET | Freq: Once | ORAL | Status: AC
  Administered 2021-12-14: 20 mg via ORAL
  Filled 2021-12-14: qty 5

## 2021-12-14 MED ORDER — DARATUMUMAB-HYALURONIDASE-FIHJ 1800-30000 MG-UT/15ML ~~LOC~~ SOLN
1800.0000 mg | Freq: Once | SUBCUTANEOUS | Status: AC
  Administered 2021-12-14: 1800 mg via SUBCUTANEOUS
  Filled 2021-12-14: qty 15

## 2021-12-14 MED ORDER — DENOSUMAB 120 MG/1.7ML ~~LOC~~ SOLN
120.0000 mg | Freq: Once | SUBCUTANEOUS | Status: AC
  Administered 2021-12-14: 120 mg via SUBCUTANEOUS
  Filled 2021-12-14: qty 1.7

## 2021-12-14 MED ORDER — DIPHENHYDRAMINE HCL 25 MG PO CAPS
25.0000 mg | ORAL_CAPSULE | Freq: Once | ORAL | Status: AC
  Administered 2021-12-14: 25 mg via ORAL
  Filled 2021-12-14: qty 1

## 2021-12-14 MED ORDER — ACETAMINOPHEN 325 MG PO TABS
650.0000 mg | ORAL_TABLET | Freq: Once | ORAL | Status: AC
  Administered 2021-12-14: 650 mg via ORAL
  Filled 2021-12-14: qty 2

## 2021-12-14 NOTE — Patient Instructions (Signed)
[facility] at [facility] Discharge Instructions   You were seen and examined today by Dr. Delton Coombes.  He reviewed the results of your lab work. Your myeloma numbers look good. After today, we will discontinue the Darzalex injection. We will continue to give you Velcade injections in the clinic every other week. Continue Revlimid (chemo pill) as prescribed 21 days on and 7 days off.   Continue to hold amlodipine as your blood pressure remains a low normal.   Return as scheduled.    Thank you for choosing [facility] at [facility] to provide your oncology and hematology care.  To afford each patient quality time with our provider, please arrive at least 15 minutes before your scheduled appointment time.   If you have a lab appointment with the [facility] please come in thru the Main Entrance and check in at the main information desk.  You need to re-schedule your appointment should you arrive 10 or more minutes late.  We strive to give you quality time with our providers, and arriving late affects you and other patients whose appointments are after yours.  Also, if you no show three or more times for appointments you may be dismissed from the clinic at the providers discretion.     Again, thank you for choosing [facility].  Our hope is that these requests will decrease the amount of time that you wait before being seen by our physicians.       _____________________________________________________________  Should you have questions after your visit to [facility], please contact our office at [phone] and follow the prompts.  Our office hours are 8:00 a.m. and 4:30 p.m. Monday - Friday.  Please note that voicemails left after 4:00 p.m. may not be returned until the following business day.  We are closed weekends and major holidays.  You do have access to a nurse 24-7, just call the main number to the clinic  [phone] and do not press any options, hold on the line and a nurse will answer the phone.    For prescription refill requests, have your pharmacy contact our office and allow 72 hours.    Due to Covid, you will need to wear a mask upon entering the hospital. If you do not have a mask, a mask will be given to you at the Main Entrance upon arrival. For doctor visits, patients may have 1 support person age 36 or older with them. For treatment visits, patients can not have anyone with them due to social distancing guidelines and our immunocompromised population.

## 2021-12-14 NOTE — Progress Notes (Signed)
Pt presents today for Daratumumab and Xgeva injection per provider's order. Vital signs and labs WNL for treatment. Okay to proceed with treatment today per Dr.K.  Per Dr.K: After today, we are discontinuing  Dara injection. Velcade is changing to every other week. Pt is to get last dara injection today. No Velcade today. Starting Velcade every other week next week.  Pt denies tooth or jaw pain and no recent or future dental appointments at this time. Pt reports taking Calcium and Vit D supplements as directed.  Daratumumab and Xgeva injections given today per MD orders. Tolerated infusion without adverse affects. Vital signs stable. No complaints at this time. Discharged from clinic ambulatory with walker  in stable condition. Alert and oriented x 3. F/U with [facility] as scheduled.

## 2021-12-14 NOTE — Progress Notes (Signed)
[place], [place] [address]   CLINIC:  Medical Oncology/Hematology  PCP:  Renee Rival, FNP [address] [place] / [place] [address] [phone]   REASON FOR VISIT:  Follow-up for multiple myeloma  PRIOR THERAPY: none  NGS Results: not done  CURRENT THERAPY: DaraVRd (Daratumumab SQ) q21d x 6 Cycles (Induction/Consolidation)  BRIEF ONCOLOGIC HISTORY:  Oncology History  IgA myeloma ([place])  05/06/2021 Initial Diagnosis   IgA myeloma ([place])   08/10/2021 -  Chemotherapy   Patient is on Treatment Plan : MYELOMA NEWLY DIAGNOSED TRANSPLANT CANDIDATE DaraVRd (Daratumumab SQ) q21d x 6 Cycles (Induction/Consolidation)       CANCER STAGING:  Cancer Staging  No matching staging information was found for the patient.  INTERVAL HISTORY:  [name], a 71 y.o. female, returns for routine follow-up and consideration for next cycle of chemotherapy. Radhika was last seen on 11/16/2021.  Due for day #15 cycle #6 of Darzalex Faspro today.   Overall, she tells me she has been feeling pretty well. She reports redness and peeling at the injection site. She reports burning in her toes during the day when she is wearing shoes, and this pain is relieved at night when she removes her shoes and therefore does not disrupt her sleep. She continues to take Revlimid 3 weeks one and 1 week off.   Overall, she feels ready for next cycle of chemo today.    REVIEW OF SYSTEMS:  Review of Systems  Constitutional:  Negative for appetite change and fatigue.  Musculoskeletal:  Positive for arthralgias (7/10 toes).  Neurological:  Positive for dizziness and numbness (burning - toes).  Psychiatric/Behavioral:  Negative for sleep disturbance.   All other systems reviewed and are negative.   PAST MEDICAL/SURGICAL HISTORY:  Past Medical History:  Diagnosis Date   Acute myocardial infarction [facility]) 2009   CAD/no stent medically  managed   Anxiety disorder    CAD (coronary artery disease)    Cancer (HCC)    Hyperlipidemia    Hypertension    IBS (irritable bowel syndrome)    Non-ulcer dyspepsia 04/30/2009   Qualifier: Diagnosis of  By: Oneida Alar MD, Sandi L    Overactive bladder    PE (pulmonary embolism) 10/2012   Presence of permanent cardiac pacemaker    Sleep apnea    Past Surgical History:  Procedure Laterality Date   ABDOMINAL HYSTERECTOMY     BSO secondary to cyst     CARDIAC CATHETERIZATION     COLONOSCOPY  12/2009   Dr. West Carbo, propofol, normal. Next TCS 12/2019   COLONOSCOPY N/A 05/22/2017   Examined portion ileum was normal, significant looping of colon, external hemorrhoids and rectal bleeding due to internal hemorrhoids, mild diverticulosis procedure: COLONOSCOPY;  Surgeon: Danie Binder, MD;  [facility];  Service: Endoscopy;  Laterality: N/A;  10:30am   ESOPHAGOGASTRODUODENOSCOPY  05/11/2009   schatzki ring/small hiatal hernia/path:gastritis   ESOPHAGOGASTRODUODENOSCOPY N/A 05/22/2017   Multiple gastric polyps with biopsy benign fundic gland polyp, small bowel biopsy negative for celiac, gastric biopsy with mild gastritis but no H. pylori procedure: ESOPHAGOGASTRODUODENOSCOPY (EGD);  Surgeon: Danie Binder, MD;  Location: [facility];  Service: Endoscopy;  Laterality: N/A;   ESOPHAGOGASTRODUODENOSCOPY (EGD) WITH ESOPHAGEAL DILATION N/A 04/01/2013   [id number] at the gastroesophageal juction/multiple small polyps/mild gastritis   GIVENS CAPSULE STUDY N/A 06/07/2017   Frequent gastric erosions, normal small bowel mucosa.  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    SOCIAL HISTORY:  Social History   Socioeconomic History   Marital status: Married    Spouse  name: Games developer   Number of children: 3   Years of education: 16   Highest education level: Not on file  Occupational History   Not on file  Tobacco Use   Smoking status: Never   Smokeless tobacco: Never   Tobacco comments:    Never smoked   Vaping Use   Vaping Use: Never used  Substance and Sexual Activity   Alcohol use: Yes    Alcohol/week: 0.0 standard drinks of alcohol    Comment: occ wine   Drug use: No   Sexual activity: Not Currently  Other Topics Concern   Not on file  Social History Narrative      Lives with husband-51 years 46 in Aug 2021   Daughter is close by, 2 sons live further away   One IN [place],ONE IN [place], [place].        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   Enjoys: reading, young adult      Diet: eats all food groups    Caffeine: coffee 1, tea daily soda-1 daily   Water: 1-2 cups      Wears seat belt    Does not use phone while driving   Smoke detectors at home    Public house manager -locked up      Left handed   One story home   Drinks caffeine   Social Determinants of Health   Financial Resource Strain: Low Risk  (11/15/2021)   Overall Financial Resource Strain (CARDIA)    Difficulty of Paying Living Expenses: Not hard at all  Food Insecurity: No Food Insecurity (11/15/2021)   Hunger Vital Sign    Worried About Running Out of Food in the Last Year: Never true    Ran Out of Food in the Last Year: Never true  Transportation Needs: No Transportation Needs (11/15/2021)   PRAPARE - Hydrologist (Medical): No    Lack of Transportation (Non-Medical): No  Physical Activity: Inactive (11/26/2021)   Exercise Vital Sign    Days of Exercise per Week: 0 days    Minutes of Exercise per Session: 0 min  Stress: Stress Concern Present (11/26/2021)   [place]    Feeling of Stress : To some extent  Social Connections: Moderately  Integrated (11/11/2020)   Social Connection and Isolation Panel [NHANES]    Frequency of Communication with Friends and Family: Once a week    Frequency of Social Gatherings with Friends and Family: Once a week    Attends Religious Services: More than 4 times per year    Active Member of [employer] or Organizations: Yes    Attends Archivist Meetings: More than 4 times per year    Marital Status: Married  Human resources officer Violence: Not At Risk (11/11/2020)   Humiliation, Afraid, Rape, and Kick questionnaire    Fear of Current or Ex-Partner: No    Emotionally Abused: No    Physically Abused: No    Sexually Abused: No    FAMILY HISTORY:  Family History  Problem Relation  Age of Onset   Colon polyps Neg Hx    Colon cancer Neg Hx     CURRENT MEDICATIONS:  Current Outpatient Medications  Medication Sig Dispense Refill   acetaminophen (TYLENOL) 500 MG tablet Take 1,000 mg by mouth 2 (two) times daily as needed for moderate pain or headache.     acyclovir (ZOVIRAX) 400 MG tablet Take 1 tablet (400 mg total) by mouth 2 (two) times daily. 60 tablet 6   albuterol (PROVENTIL HFA;VENTOLIN HFA) 108 (90 BASE) MCG/ACT inhaler Inhale 2 puffs into the lungs every 6 (six) hours as needed for shortness of breath.     amLODipine (NORVASC) 10 MG tablet Take 1 tablet (10 mg total) by mouth daily. 90 tablet 1   Ascorbic Acid 500 MG CHEW Chew by mouth.     aspirin EC 81 MG tablet Take 81 mg by mouth at bedtime.     B Complex Vitamins (VITAMIN B COMPLEX) TABS Take 1 tablet by mouth daily.     bortezomib SQ (VELCADE) 3.5 MG injection Inject into the skin once.     busPIRone (BUSPAR) 5 MG tablet Take 1 tablet (5 mg total) by mouth daily. 30 tablet 3   Calcium Carbonate-Vitamin D (CALCIUM 600+D PO) Take 1 tablet by mouth 2 (two) times daily.     chlorthalidone (HYGROTON) 25 MG tablet Take 25 mg by mouth daily.      cholecalciferol (VITAMIN D) 25 MCG (1000 UNIT) tablet Take 1,000 Units by mouth daily.      cyanocobalamin 1000 MCG tablet Take 1 tablet by mouth daily.     cyclobenzaprine (FLEXERIL) 10 MG tablet Take 0.5 tablets (5 mg total) by mouth 2 (two) times daily as needed for muscle spasms. 20 tablet 0   Daratumumab-Hyaluronidase-fihj (DARZALEX FASPRO ) Inject into the skin.     diclofenac Sodium (VOLTAREN) 1 % GEL Apply 4 g topically 4 (four) times daily. 50 g 0   fluticasone (FLONASE) 50 MCG/ACT nasal spray USE 2 SPRAYS IN EACH NOSTRIL ONCE DAILY. 16 g 0   fluticasone-salmeterol (ADVAIR) 250-50 MCG/ACT AEPB Inhale 1 puff into the lungs 2 (two) times daily.     folic acid (FOLVITE) 1 MG tablet TAKE 1 TABLET BY MOUTH ONCE DAILY. (Patient taking differently: Take 1 mg by mouth daily.) 30 tablet 11   gabapentin (NEURONTIN) 600 MG tablet Take 600 mg by mouth 2 (two) times daily.     hydrochlorothiazide (MICROZIDE) 12.5 MG capsule Take 12.5 mg by mouth daily.     hydrOXYzine (VISTARIL) 25 MG capsule      Hyoscyamine Sulfate SL 0.125 MG SUBL      lenalidomide (REVLIMID) 15 MG capsule Take 1 capsule (15 mg total) by mouth daily. 21 days on, 7 days off every 28 days 21 capsule 0   lidocaine (XYLOCAINE) 2 % solution TAKE 2 TEASPOONFULS BEFORE MEALS AND AT BEDTIME AS NEEDED MAY REPEAT EVERY 4 HOURS. (MAX OF 8 DOSES PER DAY) 300 mL 0   Lidocaine HCl (XOLIDO) 2 % CREA Apply 1 application topically daily as needed (arthritisis).     loperamide (IMODIUM) 2 MG capsule      losartan (COZAAR) 100 MG tablet Take 100 mg by mouth daily.     magnesium oxide (MAG-OX) 400 (240 Mg) MG tablet Take 1 tablet (400 mg total) by mouth 2 (two) times daily. 60 tablet 4   meclizine (ANTIVERT) 25 MG tablet      metoprolol tartrate (LOPRESSOR) 50 MG tablet Take 1 tablet (50 mg  total) by mouth 2 (two) times daily. 180 tablet 1   mirtazapine (REMERON) 15 MG tablet Take 15 mg by mouth at bedtime.     montelukast (SINGULAIR) 10 MG tablet Take 10 mg by mouth daily.      MYRBETRIQ 25 MG TB24 tablet Take 25 mg by mouth daily.      nitroGLYCERIN (NITROSTAT) 0.4 MG SL tablet Place 0.4 mg under the tongue every 5 (five) minutes as needed for chest pain.     ondansetron (ZOFRAN-ODT) 4 MG disintegrating tablet      pantoprazole (PROTONIX) 40 MG tablet Take 40 mg by mouth daily.     potassium chloride (KLOR-CON M) 10 MEQ tablet Take 1 tablet (10 mEq total) by mouth 2 (two) times daily. 60 tablet 6   potassium chloride SA (KLOR-CON M) 20 MEQ tablet Take 1 tablet (20 mEq total) by mouth 3 (three) times daily. 21 tablet 0   primidone (MYSOLINE) 50 MG tablet Take 50 mg by mouth daily.     Probiotic Product (GNP PROBIOTIC COLON SUPPORT) CAPS Take 1 capsule by mouth daily.     sertraline (ZOLOFT) 100 MG tablet Take 200 mg by mouth daily.      simvastatin (ZOCOR) 40 MG tablet Take 40 mg by mouth at bedtime.     sucralfate (CARAFATE) 1 GM/10ML suspension      tolterodine (DETROL LA) 4 MG 24 hr capsule      warfarin (COUMADIN) 3 MG tablet Take 3 mg by mouth daily.     No current facility-administered medications for this visit.    ALLERGIES:  Allergies  Allergen Reactions   Amoxicillin-Pot Clavulanate Other (See Comments)   Biaxin [Clarithromycin] Other (See Comments)    Stomach problems    Erythromycin    Lisinopril Swelling    PHYSICAL EXAM:  Performance status (ECOG): 1 - Symptomatic but completely ambulatory  There were no vitals filed for this visit. Wt Readings from Last 3 Encounters:  12/10/21 162 lb 7.7 oz (73.7 kg)  12/07/21 160 lb 6.4 oz (72.8 kg)  11/19/21 164 lb 6.4 oz (74.6 kg)   Physical Exam Vitals reviewed.  Constitutional:      Appearance: Normal appearance.  Cardiovascular:     Rate and Rhythm: Normal rate and regular rhythm.     Pulses: Normal pulses.     Heart sounds: Normal heart sounds.  Pulmonary:     Effort: Pulmonary effort is normal.     Breath sounds: Normal breath sounds.  Neurological:     General: No focal deficit present.     Mental Status: She is alert and oriented to person,  place, and time.  Psychiatric:        Mood and Affect: Mood normal.        Behavior: Behavior normal.     LABORATORY DATA:  I have reviewed the labs as listed.     Latest Ref Rng & Units 12/07/2021    8:47 AM 11/30/2021    8:23 AM 11/16/2021   10:17 AM  CBC  WBC 4.0 - 10.5 K/uL 2.9  2.7  4.9   Hemoglobin 12.0 - 15.0 g/dL 11.4  11.5  10.9   Hematocrit 36.0 - 46.0 % 35.0  35.4  34.2   Platelets 150 - 400 K/uL 100  114  83       Latest Ref Rng & Units 12/07/2021    8:47 AM 11/30/2021    8:23 AM 11/16/2021   10:17 AM  CMP  Glucose 70 -  99 mg/dL 88  98  83   BUN 8 - 23 mg/dL '16  15  14   ' Creatinine 0.44 - 1.00 mg/dL 0.71  0.75  0.79   Sodium 135 - 145 mmol/L 140  137  139   Potassium 3.5 - 5.1 mmol/L 3.6  3.5  3.5   Chloride 98 - 111 mmol/L 104  105  105   CO2 22 - 32 mmol/L '29  29  25   ' Calcium 8.9 - 10.3 mg/dL 8.9  8.4  7.8   Total Protein 6.5 - 8.1 g/dL 6.6  6.6  6.5   Total Bilirubin 0.3 - 1.2 mg/dL 0.7  0.7  0.7   Alkaline Phos 38 - 126 U/L 53  51  57   AST 15 - 41 U/L '21  22  20   ' ALT 0 - 44 U/L '20  19  17     ' DIAGNOSTIC IMAGING:  I have independently reviewed the scans and discussed with the patient. DG Knee 2 Views Right  Result Date: 11/15/2021 CLINICAL DATA:  [place]. Right knee pain. EXAM: RIGHT KNEE - 1-2 VIEW COMPARISON:  None Available. FINDINGS: No acute fractures identified. The prosthesis is intact. No obvious joint effusion. IMPRESSION: No acute bony findings. Electronically Signed   By: Marijo Sanes M.D.   On: 11/15/2021 11:43     ASSESSMENT:  1.  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  Increased number of atypical plasma cells present 36% of all cell lines.  Plasma cells are kappa light chain restricted. -Unfortunately her specimen has reached cytogenetics and FISH lab week later due to bad weather and no viable plasma cells. -PET scan on 07/09/2019 showed no findings  of active myeloma. -24-hour urine shows total protein 59 mg.  Urine immunofixation shows IgA kappa monoclonal protein.  LDH is 184. -Based on [facility] criteria she has high risk with about 45-50% probability of progression to myeloma in the next 2 years. -CTAP on 01/07/2020 shows acute superior endplate burst fracture of L1.  Mild associated paravertebral soft tissue thickening.  No other bone abnormalities. -Bone density test on 02/07/2020 shows T score -0.5. - Bone marrow biopsy on 07/12/2021: Hypercellular with increased number of atypical plasma cells representing 30% of cells, displaying kappa light chain restriction. - FISH panel: Gain of 1 q. (high risk), T p53 deletion (high risk), monosomy 13 (standard risk) - Cytogenetics: Complex karyotype. - PET scan (06/24/2021): New hypermetabolic bone lesions involving left anterior iliac crest, left mid humerus, right superior pubic ramus, bilateral mid femurs, left calvarium.  Largest lytic lesion in the left iliac crest measuring 2.9 x 2.3 cm. - 2D echo on 07/29/2021: EF 50-55%.  There is distal septal and inferior apical hypokinesis.  (Carfilzomib based regimen was put on hold due to echo findings) - Dara VRD cycle 1 started on 08/10/2021.  2.  Social/family history: - She uses walker to ambulate since her right knee replacement leading to stiffness.  She lives with older sister and husband who has dementia. - She is independent of ADLs and IADLs.  She also drives.   PLAN:  1.  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 - DIRA test was negative for M spike.  Last M spike was 0.2 g.  Free light chain ratio is normal at 1.59.  Immunofixation was positive for IgG kappa. -  As all her myeloma panel was negative, I have recommended de-escalation of treatment at this time. - She will proceed with cycle 6-day 15 today. - After completion of cycle 6, we will change Velcade to every 2 weeks  maintenance.  She will continue Revlimid 15 mg 3 weeks on/1 week off.  If her M spike remains stable and undetectable, will further dose reduce Revlimid to 10 mg maintenance dose. - We will discontinue Darzalex after today. - RTC 5 weeks for follow-up with repeat myeloma labs.   2.  Electrolyte abnormalities: - Continue magnesium twice daily and potassium 20 mEq daily.   3.  Pulmonary embolism (2015): - Continue Coumadin indefinitely.  No bleeding issues.  4.  Hypotension: - Continue to hold Norvasc.  Continue metoprolol 50 mg half tablet twice daily and Cozaar 100 mg daily.  5.  ID prophylaxis: - Continue acyclovir 4 mg twice daily.  6.  Myeloma bone disease: - Continue calcium 2 tablets daily.  Continue denosumab monthly.  7.  Peripheral neuropathy: - She reports burning of the toes when she wear shoes.   Orders placed this encounter:  No orders of the defined types were placed in this encounter.    Derek Jack, MD [place] [phone]   I, Thana Ates, am acting as a scribe for Dr. Derek Jack.  I, Derek Jack MD, have reviewed the above documentation for accuracy and completeness, and I agree with the above.

## 2021-12-14 NOTE — Patient Instructions (Signed)
[facility]  Discharge Instructions: Thank you for choosing [facility] to provide your oncology and hematology care.  If you have a lab appointment with the [facility], please come in thru the Main Entrance and check in at the main information desk.  Wear comfortable clothing and clothing appropriate for easy access to any Portacath or PICC line.   We strive to give you quality time with your provider. You may need to reschedule your appointment if you arrive late (15 or more minutes).  Arriving late affects you and other patients whose appointments are after yours.  Also, if you miss three or more appointments without notifying the office, you may be dismissed from the clinic at the provider's discretion.      For prescription refill requests, have your pharmacy contact our office and allow 72 hours for refills to be completed.    Today you received the following chemotherapy and/or immunotherapy agents Daratumumab injection.   To help prevent nausea and vomiting after your treatment, we encourage you to take your nausea medication as directed.  BELOW ARE SYMPTOMS THAT SHOULD BE REPORTED IMMEDIATELY: *FEVER GREATER THAN 100.4 F (38 C) OR HIGHER *CHILLS OR SWEATING *NAUSEA AND VOMITING THAT IS NOT CONTROLLED WITH YOUR NAUSEA MEDICATION *UNUSUAL SHORTNESS OF BREATH *UNUSUAL BRUISING OR BLEEDING *URINARY PROBLEMS (pain or burning when urinating, or frequent urination) *BOWEL PROBLEMS (unusual diarrhea, constipation, pain near the anus) TENDERNESS IN MOUTH AND THROAT WITH OR WITHOUT PRESENCE OF ULCERS (sore throat, sores in mouth, or a toothache) UNUSUAL RASH, SWELLING OR PAIN  UNUSUAL VAGINAL DISCHARGE OR ITCHING   Items with * indicate a potential emergency and should be followed up as soon as possible or go to the Emergency Department if any problems should occur.  Please show the CHEMOTHERAPY ALERT CARD or IMMUNOTHERAPY ALERT CARD at check-in to the  Emergency Department and triage nurse.  Should you have questions after your visit or need to cancel or reschedule your appointment, please contact [facility] [phone]  and follow the prompts.  Office hours are 8:00 a.m. to 4:30 p.m. Monday - Friday. Please note that voicemails left after 4:00 p.m. may not be returned until the following business day.  We are closed weekends and major holidays. You have access to a nurse at all times for urgent questions. Please call the main number to the clinic [phone] and follow the prompts.  For any non-urgent questions, you may also contact your provider using MyChart. We now offer e-Visits for anyone 39 and older to request care online for non-urgent symptoms. For details visit [email or web address].   Also download the MyChart app! Go to the app store, search "MyChart", open the app, select [facility], and log in with your MyChart username and password.  Masks are optional in the cancer centers. If you would like for your care team to wear a mask while they are taking care of you, please let them know. For doctor visits, patients may have with them one support person who is at least 73 years old. At this time, visitors are not allowed in the infusion area.  Daratumumab; Hyaluronidase Injection What is this medication? DARATUMUMAB; HYALURONIDASE (dar a toom ue mab / hye al ur ON i dase) is a monoclonal antibody. Hyaluronidase is used to improve the effects of daratumumab. It treats certain types of cancer. Some of the cancers treated are multiple myeloma and light-chain amyloidosis. This medicine may be used for other purposes; ask your  health care provider or pharmacist if you have questions. COMMON BRAND NAME(S): DARZALEX FASPRO What should I tell my care team before I take this medication? They need to know if you have any of these conditions: heart disease infection especially a viral infection such as chickenpox, cold sores,  herpes, or hepatitis B lung or breathing disease an unusual or allergic reaction to daratumumab, hyaluronidase, other medicines, foods, dyes, or preservatives pregnant or trying to get pregnant breast-feeding How should I use this medication? This medicine is for injection under the skin. It is given by a health care professional in a hospital or clinic setting. Talk to your pediatrician regarding the use of this medicine in children. Special care may be needed. Overdosage: If you think you have taken too much of this medicine contact a poison control center or emergency room at once. NOTE: This medicine is only for you. Do not share this medicine with others. What if I miss a dose? Keep appointments for follow-up doses as directed. It is important not to miss your dose. Call your doctor or health care professional if you are unable to keep an appointment. What may interact with this medication? Interactions have not been studied. This list may not describe all possible interactions. Give your health care provider a list of all the medicines, herbs, non-prescription drugs, or dietary supplements you use. Also tell them if you smoke, drink alcohol, or use illegal drugs. Some items may interact with your medicine. What should I watch for while using this medication? Your condition will be monitored carefully while you are receiving this medicine. This medicine can cause serious allergic reactions. To reduce your risk, your health care provider may give you other medicine to take before receiving this one. Be sure to follow the directions from your health care provider. This medicine can affect the results of blood tests to match your blood type. These changes can last for up to 6 months after the final dose. Your healthcare provider will do blood tests to match your blood type before you start treatment. Tell all of your healthcare providers that you are being treated with this medicine before  receiving a blood transfusion. This medicine can affect the results of some tests used to determine treatment response; extra tests may be needed to evaluate response. Do not become pregnant while taking this medicine or for 3 months after stopping it. Women should inform their health care provider if they wish to become pregnant or think they might be pregnant. There is a potential for serious side effects to an unborn child. Talk to your health care provider for more information. Do not breast-feed an infant while taking this medicine. What side effects may I notice from receiving this medication? Side effects that you should report to your care team as soon as possible: Allergic reactions--skin rash, itching or hives, swelling of the face, lips, or tongue Blood clot--chest pain, shortness of breath, pain, swelling or warmth in the leg Blurred vision Fast, irregular heartbeat Infection--fever, chills, cough, sore throat, pain or trouble passing urine Injection reactions--dizziness, fast heartbeat, feeling faint or lightheaded, falls, headache, increase in blood pressure, nausea, vomiting, or wheezing or trouble breathing with loud or whistling sounds Low red blood cell counts--trouble breathing, feeling faint, lightheaded or falls, unusually weak or tired Unusual bleeding or bruising Side effects that usually do not require medical attention (report these to your care team if they continue or are bothersome): Back pain Constipation Diarrhea Pain, tingling, numbness in the  hands or feet Pain, redness, or irritation at site where injected Muscle cramp or pain Swelling of the ankles, feet, hands Tiredness Trouble sleeping This list may not describe all possible side effects. Call your doctor for medical advice about side effects. You may report side effects to FDA at 1-800-FDA-1088. Where should I keep my medication? This drug is given in a hospital or clinic and will not be stored at  home. NOTE: This sheet is a summary. It may not cover all possible information. If you have questions about this medicine, talk to your doctor, pharmacist, or health care provider.  2023 Elsevier/Gold Standard (2021-04-09 00:00:00)

## 2021-12-15 LAB — KAPPA/LAMBDA LIGHT CHAINS
Kappa free light chain: 11 mg/L (ref 3.3–19.4)
Kappa, lambda light chain ratio: 1.2 (ref 0.26–1.65)
Lambda free light chains: 9.2 mg/L (ref 5.7–26.3)

## 2021-12-17 ENCOUNTER — Other Ambulatory Visit: Payer: Self-pay | Admitting: Nurse Practitioner

## 2021-12-17 LAB — IMMUNOFIXATION ELECTROPHORESIS
IgA: 52 mg/dL — ABNORMAL LOW (ref 64–422)
IgG (Immunoglobin G), Serum: 450 mg/dL — ABNORMAL LOW (ref 586–1602)
IgM (Immunoglobulin M), Srm: 26 mg/dL (ref 26–217)
Total Protein ELP: 5.8 g/dL — ABNORMAL LOW (ref 6.0–8.5)

## 2021-12-20 ENCOUNTER — Inpatient Hospital Stay (HOSPITAL_COMMUNITY): Payer: Medicare Other

## 2021-12-20 ENCOUNTER — Encounter: Payer: Self-pay | Admitting: Orthopedic Surgery

## 2021-12-20 VITALS — BP 128/78 | HR 89 | Temp 97.2°F | Resp 18 | Wt 162.3 lb

## 2021-12-20 DIAGNOSIS — C9000 Multiple myeloma not having achieved remission: Secondary | ICD-10-CM

## 2021-12-20 DIAGNOSIS — F32A Depression, unspecified: Secondary | ICD-10-CM | POA: Diagnosis not present

## 2021-12-20 DIAGNOSIS — I10 Essential (primary) hypertension: Secondary | ICD-10-CM

## 2021-12-20 DIAGNOSIS — F419 Anxiety disorder, unspecified: Secondary | ICD-10-CM | POA: Diagnosis not present

## 2021-12-20 DIAGNOSIS — D508 Other iron deficiency anemias: Secondary | ICD-10-CM

## 2021-12-20 DIAGNOSIS — Z5112 Encounter for antineoplastic immunotherapy: Secondary | ICD-10-CM | POA: Diagnosis not present

## 2021-12-20 LAB — CBC WITH DIFFERENTIAL/PLATELET
Abs Immature Granulocytes: 0.01 10*3/uL (ref 0.00–0.07)
Basophils Absolute: 0.1 10*3/uL (ref 0.0–0.1)
Basophils Relative: 2 %
Eosinophils Absolute: 0.4 10*3/uL (ref 0.0–0.5)
Eosinophils Relative: 10 %
HCT: 33.5 % — ABNORMAL LOW (ref 36.0–46.0)
Hemoglobin: 10.8 g/dL — ABNORMAL LOW (ref 12.0–15.0)
Immature Granulocytes: 0 %
Lymphocytes Relative: 21 %
Lymphs Abs: 0.8 10*3/uL (ref 0.7–4.0)
MCH: 32.2 pg (ref 26.0–34.0)
MCHC: 32.2 g/dL (ref 30.0–36.0)
MCV: 100 fL (ref 80.0–100.0)
Monocytes Absolute: 0.6 10*3/uL (ref 0.1–1.0)
Monocytes Relative: 14 %
Neutro Abs: 2.1 10*3/uL (ref 1.7–7.7)
Neutrophils Relative %: 53 %
Platelets: 84 10*3/uL — ABNORMAL LOW (ref 150–400)
RBC: 3.35 MIL/uL — ABNORMAL LOW (ref 3.87–5.11)
RDW: 16.3 % — ABNORMAL HIGH (ref 11.5–15.5)
WBC: 3.9 10*3/uL — ABNORMAL LOW (ref 4.0–10.5)
nRBC: 0 % (ref 0.0–0.2)

## 2021-12-20 LAB — PROTEIN ELECTROPHORESIS, SERUM
A/G Ratio: 1.7 (ref 0.7–1.7)
Albumin ELP: 3.5 g/dL (ref 2.9–4.4)
Alpha-1-Globulin: 0.2 g/dL (ref 0.0–0.4)
Alpha-2-Globulin: 0.6 g/dL (ref 0.4–1.0)
Beta Globulin: 0.8 g/dL (ref 0.7–1.3)
Gamma Globulin: 0.4 g/dL (ref 0.4–1.8)
Globulin, Total: 2.1 g/dL — ABNORMAL LOW (ref 2.2–3.9)
M-Spike, %: 0.2 g/dL — ABNORMAL HIGH
Total Protein ELP: 5.6 g/dL — ABNORMAL LOW (ref 6.0–8.5)

## 2021-12-20 LAB — COMPREHENSIVE METABOLIC PANEL
ALT: 30 U/L (ref 0–44)
AST: 23 U/L (ref 15–41)
Albumin: 4 g/dL (ref 3.5–5.0)
Alkaline Phosphatase: 54 U/L (ref 38–126)
Anion gap: 6 (ref 5–15)
BUN: 16 mg/dL (ref 8–23)
CO2: 30 mmol/L (ref 22–32)
Calcium: 8.3 mg/dL — ABNORMAL LOW (ref 8.9–10.3)
Chloride: 104 mmol/L (ref 98–111)
Creatinine, Ser: 0.78 mg/dL (ref 0.44–1.00)
GFR, Estimated: >60 mL/min (ref >60)
Glucose, Bld: 93 mg/dL (ref 70–99)
Potassium: 3.7 mmol/L (ref 3.5–5.1)
Sodium: 140 mmol/L (ref 135–145)
Total Bilirubin: 0.9 mg/dL (ref 0.3–1.2)
Total Protein: 6.5 g/dL (ref 6.5–8.1)

## 2021-12-20 LAB — MAGNESIUM: Magnesium: 2.1 mg/dL (ref 1.7–2.4)

## 2021-12-20 MED ORDER — BORTEZOMIB CHEMO SQ INJECTION 3.5 MG (2.5MG/ML)
1.0000 mg/m2 | Freq: Once | INTRAMUSCULAR | Status: AC
  Administered 2021-12-20: 1.75 mg via SUBCUTANEOUS
  Filled 2021-12-20: qty 0.7

## 2021-12-20 MED ORDER — PROCHLORPERAZINE MALEATE 10 MG PO TABS: 10.0000 mg | ORAL_TABLET | Freq: Four times a day (QID) | ORAL | Status: DC | PRN

## 2021-12-20 NOTE — Progress Notes (Signed)
Patient presents today for Velcade injection per providers order.  Labs and vital signs within parameters for treatment.  Patient has no new complaints at this time.    Patient will begin Velcade every 14 days after todays administration per Dr. Delton Coombes.  Stable during Velcade administration without incident; injection site WNL; see MAR for injection details.  Patient tolerated procedure well and without incident.  No questions or complaints noted at this time. Discharge from clinic ambulatory in stable condition.  Alert and oriented X 3.  Follow up with [facility] as scheduled.

## 2021-12-20 NOTE — Patient Instructions (Signed)
[facility]  Discharge Instructions: Thank you for choosing [facility] to provide your oncology and hematology care.  If you have a lab appointment with the [facility], please come in thru the Main Entrance and check in at the main information desk.  Wear comfortable clothing and clothing appropriate for easy access to any Portacath or PICC line.   We strive to give you quality time with your provider. You may need to reschedule your appointment if you arrive late (15 or more minutes).  Arriving late affects you and other patients whose appointments are after yours.  Also, if you miss three or more appointments without notifying the office, you may be dismissed from the clinic at the provider's discretion.      For prescription refill requests, have your pharmacy contact our office and allow 72 hours for refills to be completed.    Today you received the following chemotherapy and/or immunotherapy agents Velcade      To help prevent nausea and vomiting after your treatment, we encourage you to take your nausea medication as directed.  BELOW ARE SYMPTOMS THAT SHOULD BE REPORTED IMMEDIATELY: *FEVER GREATER THAN 100.4 F (38 C) OR HIGHER *CHILLS OR SWEATING *NAUSEA AND VOMITING THAT IS NOT CONTROLLED WITH YOUR NAUSEA MEDICATION *UNUSUAL SHORTNESS OF BREATH *UNUSUAL BRUISING OR BLEEDING *URINARY PROBLEMS (pain or burning when urinating, or frequent urination) *BOWEL PROBLEMS (unusual diarrhea, constipation, pain near the anus) TENDERNESS IN MOUTH AND THROAT WITH OR WITHOUT PRESENCE OF ULCERS (sore throat, sores in mouth, or a toothache) UNUSUAL RASH, SWELLING OR PAIN  UNUSUAL VAGINAL DISCHARGE OR ITCHING   Items with * indicate a potential emergency and should be followed up as soon as possible or go to the Emergency Department if any problems should occur.  Please show the CHEMOTHERAPY ALERT CARD or IMMUNOTHERAPY ALERT CARD at check-in to the Emergency  Department and triage nurse.  Should you have questions after your visit or need to cancel or reschedule your appointment, please contact [facility] [phone]  and follow the prompts.  Office hours are 8:00 a.m. to 4:30 p.m. Monday - Friday. Please note that voicemails left after 4:00 p.m. may not be returned until the following business day.  We are closed weekends and major holidays. You have access to a nurse at all times for urgent questions. Please call the main number to the clinic [phone] and follow the prompts.  For any non-urgent questions, you may also contact your provider using MyChart. We now offer e-Visits for anyone 73 and older to request care online for non-urgent symptoms. For details visit [email or web address].   Also download the MyChart app! Go to the app store, search "MyChart", open the app, select [facility], and log in with your MyChart username and password.  Masks are optional in the cancer centers. If you would like for your care team to wear a mask while they are taking care of you, please let them know. For doctor visits, patients may have with them one support person who is at least 73 years old. At this time, visitors are not allowed in the infusion area.

## 2021-12-20 NOTE — Progress Notes (Signed)
Confirmed treatment today then going to every 2 weeks.  Ok to treat with Platelets   T.O. Dr Rhys Martini, PharmD

## 2021-12-21 ENCOUNTER — Other Ambulatory Visit: Payer: Self-pay

## 2021-12-21 MED ORDER — LENALIDOMIDE 15 MG PO CAPS: 15.0000 mg | ORAL_CAPSULE | Freq: Every day | ORAL | 0 refills | Status: DC

## 2021-12-21 NOTE — Telephone Encounter (Signed)
Chart reviewed. Revlimid refilled per last office note with Dr. Katragadda.  

## 2021-12-31 ENCOUNTER — Encounter: Payer: Self-pay | Admitting: Orthopedic Surgery

## 2021-12-31 ENCOUNTER — Ambulatory Visit (INDEPENDENT_AMBULATORY_CARE_PROVIDER_SITE_OTHER): Payer: Medicare Other | Admitting: Orthopedic Surgery

## 2021-12-31 VITALS — BP 115/76 | HR 82 | Ht 64.0 in | Wt 159.4 lb

## 2021-12-31 DIAGNOSIS — Z96651 Presence of right artificial knee joint: Secondary | ICD-10-CM

## 2021-12-31 DIAGNOSIS — M48061 Spinal stenosis, lumbar region without neurogenic claudication: Secondary | ICD-10-CM | POA: Diagnosis not present

## 2021-12-31 NOTE — Progress Notes (Addendum)
Second opinion //OFFICE VISIT   Chief Complaint  Patient presents with   New Patient (Initial Visit)   Knee Pain    RT knee/ hx of TKA April 2022 Falling and balance/ has stiffness and swelling   This is a 71 year old female who was recently diagnosed with multiple myeloma she is currently on warfarin she has had a PE in the past presents for evaluation of a right total knee arthroplasty which was done in [place] in 2022.  She said she was having mild pain in her knee but her major problem prior to surgery was instability, inability to weight-bear on the right leg and giving way of the right leg.  She still has the same complaints and says that those issues have not been improved by surgery  She does have back pain has a history of spinal stenosis her notes indicate that the spinal stenosis was addressed with physical therapy and epidural injections her back pain seems to come and go     ROS: Positive symptoms include weight loss fatigue itching congestion sore throat blurred vision chest pain heart palpitations leg pain after walking cough shortness of breath wheezing heartburn abdominal pain diarrhea constipation urgency frequency muscle ache neck pain back pain joint pain frequent falls easy bruising pollen allergy dizziness headaches tremor speech changes weakness depression memory loss the other systems were negative ROS   BP 115/76   Pulse 82   Ht $R'5\' 4"'uU$  (1.626 m)   Wt 159 lb 6.4 oz (72.3 kg)   BMI 27.36 kg/m   Body mass index is 27.36 kg/m.  General appearance: Well-developed well-nourished no gross deformities  Cardiovascular normal pulse and perfusion normal color without edema  Neurologically no sensation loss or deficits or pathologic reflexes  Psychological: Awake alert and oriented x3 mood and affect normal  Skin no lacerations or ulcerations no nodularity no palpable masses, no erythema or nodularity  Musculoskeletal:  Gait she is still on a walker  Right  knee midline incision well-healed no sign of infection no tenderness around the knee except a little over the medial femoral condyle.  She has full extension and stable she has a trace anterior drawer and flexion but within the normal range no warmth is noted in the joint.  Her leg extension power is normal.        Past Medical History:  Diagnosis Date   Acute myocardial infarction [facility]) 2009   CAD/no stent medically managed   Anxiety disorder    CAD (coronary artery disease)    Cancer ([place])    Hyperlipidemia    Hypertension    IBS (irritable bowel syndrome)    Non-ulcer dyspepsia 04/30/2009   Qualifier: Diagnosis of  By: Oneida Alar MD, Sandi L    Overactive bladder    PE (pulmonary embolism) 10/2012   Presence of permanent cardiac pacemaker    Sleep apnea     Past Surgical History:  Procedure Laterality Date   ABDOMINAL HYSTERECTOMY     BSO secondary to cyst     CARDIAC CATHETERIZATION     COLONOSCOPY  12/2009   Dr. West Carbo, propofol, normal. Next TCS 12/2019   COLONOSCOPY N/A 05/22/2017   Examined portion ileum was normal, significant looping of colon, external hemorrhoids and rectal bleeding due to internal hemorrhoids, mild diverticulosis procedure: COLONOSCOPY;  Surgeon: Danie Binder, MD;  Location: [facility];  Service: Endoscopy;  Laterality: N/A;  10:30am   ESOPHAGOGASTRODUODENOSCOPY  05/11/2009   schatzki ring/small hiatal hernia/path:gastritis   ESOPHAGOGASTRODUODENOSCOPY N/A  05/22/2017   Multiple gastric polyps with biopsy benign fundic gland polyp, small bowel biopsy negative for celiac, gastric biopsy with mild gastritis but no H. pylori procedure: ESOPHAGOGASTRODUODENOSCOPY (EGD);  Surgeon: Danie Binder, MD;  Location: [facility] END[facility] SUITE;  Service: Endoscopy;  Laterality: N/A;   ESOPHAGOGASTRODUODENOSCOPY (EGD) WITH ESOPHAGEAL DILATION N/A 04/01/2013   [id number] at the gastroesophageal juction/multiple small polyps/mild gastritis   GIVENS CAPSULE STUDY N/A  06/07/2017   Frequent gastric erosions, normal small bowel mucosa.  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    Family History  Problem Relation Age of Onset   Colon polyps Neg Hx    Colon cancer Neg Hx    Social History   Tobacco Use   Smoking status: Never   Smokeless tobacco: Never   Tobacco comments:    Never smoked   Vaping Use   Vaping Use: Never used  Substance Use Topics   Alcohol use: Yes    Alcohol/week: 0.0 standard drinks of alcohol    Comment: occ wine   Drug use: No    Allergies  Allergen Reactions   Amoxicillin-Pot Clavulanate Other (See Comments)   Biaxin [Clarithromycin] Other (See Comments)    Stomach problems    Erythromycin    Lisinopril Swelling    Current Meds  Medication Sig   acetaminophen (TYLENOL) 500 MG tablet Take 1,000 mg by mouth 2 (two) times daily as needed for moderate pain or headache.   acyclovir (ZOVIRAX) 400 MG tablet Take 1 tablet (400 mg total) by mouth 2 (two) times daily.   albuterol (PROVENTIL HFA;VENTOLIN HFA) 108 (90 BASE) MCG/ACT inhaler Inhale 2 puffs into the lungs every 6 (six) hours as needed for shortness of breath.   Ascorbic Acid 500 MG CHEW Chew by mouth.   aspirin EC 81 MG tablet Take 81 mg by mouth at bedtime.   B Complex Vitamins (VITAMIN B COMPLEX) TABS Take 1 tablet by mouth daily.   bortezomib SQ (VELCADE) 3.5 MG injection Inject into the skin once.   busPIRone (BUSPAR) 5 MG tablet Take 1 tablet (5 mg total) by mouth daily.   Calcium Carbonate-Vitamin D (CALCIUM 600+D PO) Take 1 tablet by mouth 2 (two) times daily.   chlorthalidone (HYGROTON) 25 MG tablet Take 25 mg by mouth daily.    cholecalciferol (VITAMIN D) 25 MCG (1000 UNIT) tablet Take 1,000 Units by mouth daily.   cyanocobalamin 1000 MCG  tablet Take 1 tablet by mouth daily.   cyclobenzaprine (FLEXERIL) 10 MG tablet TAKE 1/2 TABLET BY MOUTH TWICE DAILY AS NEEDED FOR MUSCLE SPASMS.   Daratumumab-Hyaluronidase-fihj (DARZALEX FASPRO [place]) Inject into the skin.   diclofenac Sodium (VOLTAREN) 1 % GEL Apply 4 g topically 4 (four) times daily.   fluticasone (FLONASE) 50 MCG/ACT nasal spray USE 2 SPRAYS IN EACH NOSTRIL ONCE DAILY.   folic acid (FOLVITE) 1 MG tablet TAKE 1 TABLET BY MOUTH ONCE DAILY. (Patient taking differently: Take 1 mg by mouth daily.)   gabapentin (NEURONTIN) 600 MG tablet Take 600 mg by mouth 2 (two) times daily.   hydrochlorothiazide (MICROZIDE) 12.5 MG capsule Take 12.5 mg by mouth daily.   hydrOXYzine (VISTARIL) 25 MG capsule    Hyoscyamine Sulfate SL 0.125 MG SUBL    lenalidomide (REVLIMID) 15 MG  capsule Take 1 capsule (15 mg total) by mouth daily. 21 days on, 7 days off every 28 days   lidocaine (XYLOCAINE) 2 % solution TAKE 2 TEASPOONFULS BEFORE MEALS AND AT BEDTIME AS NEEDED MAY REPEAT EVERY 4 HOURS. (MAX OF 8 DOSES PER DAY)   Lidocaine HCl (XOLIDO) 2 % CREA Apply 1 application topically daily as needed (arthritisis).   loperamide (IMODIUM) 2 MG capsule    losartan (COZAAR) 100 MG tablet Take 100 mg by mouth daily.   magnesium oxide (MAG-OX) 400 (240 Mg) MG tablet Take 1 tablet (400 mg total) by mouth 2 (two) times daily.   metoprolol tartrate (LOPRESSOR) 50 MG tablet Take 1 tablet (50 mg total) by mouth 2 (two) times daily.   montelukast (SINGULAIR) 10 MG tablet Take 10 mg by mouth daily.    MYRBETRIQ 25 MG TB24 tablet TAKE 1 TABLET BY MOUTH ONCE DAILY.   nitroGLYCERIN (NITROSTAT) 0.4 MG SL tablet Place 0.4 mg under the tongue every 5 (five) minutes as needed for chest pain.   ondansetron (ZOFRAN-ODT) 4 MG disintegrating tablet    pantoprazole (PROTONIX) 40 MG tablet Take 40 mg by mouth daily.   potassium chloride SA (KLOR-CON M) 20 MEQ tablet Take 1 tablet (20 mEq total) by mouth 3 (three) times daily.    primidone (MYSOLINE) 50 MG tablet Take 50 mg by mouth daily.   Probiotic Product (GNP PROBIOTIC COLON SUPPORT) CAPS Take 1 capsule by mouth daily.   sertraline (ZOLOFT) 100 MG tablet Take 200 mg by mouth daily.    simvastatin (ZOCOR) 40 MG tablet Take 40 mg by mouth at bedtime.   sucralfate (CARAFATE) 1 GM/10ML suspension    warfarin (COUMADIN) 3 MG tablet Take 3 mg by mouth daily.     MEDICAL DECISION MAKING  A.  Encounter Diagnoses  Name Primary?   Spinal stenosis of lumbar region without neurogenic claudication Yes   Status post total right knee replacement     B. DATA ANALYSED:   IMAGING: Interpretation of images: I have personally reviewed the images and my interpretation is 3 sets of images 1 is from disc dated 2022 and then 1 from 2023 from our ER  Also have a preop set of images which show varus alignment mild effusion of the right knee narrowing and sclerosis medial compartment grade 4 disease  No signs of loosening or malalignment   Orders: No new orders  Outside records reviewed: Notes were brought over 20 encounters are noted with approximately 52 pages which were reviewed and indicate the patient's history of knee replacement as well as spinal stenosis and epidural injection   C. MANAGEMENT   Second opinion  I think the knee is in good position there is no loosening there is really no real knee pain and there is no swelling the trace positive drawer test and flexion is within the normal range.  It looks like it is an attune fixed-bearing total knee  I think her problem is coming from her spinal stenosis that would account for her giving way intermittent pain weakness and instability  Recommend she continue with her doctor for appropriate treatment for the spinal stenosis but no surgery is indicated in my opinion regarding the knee  Cannot rule out that the patient may have had 2 issues 1 osteoarthritis right knee with disabling pain and spinal stenosis with  right leg weakness  No orders of the defined types were placed in this encounter.    Arther Abbott, MD  12/31/2021 9:17  AM

## 2021-12-31 NOTE — Patient Instructions (Signed)
Opinion regarding issues with right total knee  The right total knee is in good position on x-ray.  There is no evidence of malalignment.  There is no evidence of loosening.   The issue appears to be that the patient had instability, giving way, and ability to bear weight on the right lower extremity prior to the knee surgery.    The knee replacement though put in well did not address the symptoms the patient had before surgery  Recommend the patient continue ongoing care with Dr. Dillard Portlock for the spinal stenosis which is more likely to be the cause of the symptoms

## 2022-01-03 ENCOUNTER — Inpatient Hospital Stay: Payer: Medicare Other | Attending: Hematology

## 2022-01-03 ENCOUNTER — Inpatient Hospital Stay (HOSPITAL_COMMUNITY): Payer: Medicare Other | Attending: Hematology

## 2022-01-03 VITALS — BP 115/71 | HR 98 | Temp 98.1°F | Resp 18 | Wt 159.8 lb

## 2022-01-03 DIAGNOSIS — Z5112 Encounter for antineoplastic immunotherapy: Secondary | ICD-10-CM | POA: Diagnosis present

## 2022-01-03 DIAGNOSIS — C9000 Multiple myeloma not having achieved remission: Secondary | ICD-10-CM | POA: Insufficient documentation

## 2022-01-03 LAB — CBC WITH DIFFERENTIAL/PLATELET
Abs Immature Granulocytes: 0.02 10*3/uL (ref 0.00–0.07)
Basophils Absolute: 0.1 10*3/uL (ref 0.0–0.1)
Basophils Relative: 5 %
Eosinophils Absolute: 0.4 10*3/uL (ref 0.0–0.5)
Eosinophils Relative: 12 %
HCT: 34.5 % — ABNORMAL LOW (ref 36.0–46.0)
Hemoglobin: 11.1 g/dL — ABNORMAL LOW (ref 12.0–15.0)
Immature Granulocytes: 1 %
Lymphocytes Relative: 28 %
Lymphs Abs: 0.9 10*3/uL (ref 0.7–4.0)
MCH: 32.6 pg (ref 26.0–34.0)
MCHC: 32.2 g/dL (ref 30.0–36.0)
MCV: 101.2 fL — ABNORMAL HIGH (ref 80.0–100.0)
Monocytes Absolute: 0.5 10*3/uL (ref 0.1–1.0)
Monocytes Relative: 15 %
Neutro Abs: 1.3 10*3/uL — ABNORMAL LOW (ref 1.7–7.7)
Neutrophils Relative %: 39 %
Platelets: 112 10*3/uL — ABNORMAL LOW (ref 150–400)
RBC: 3.41 MIL/uL — ABNORMAL LOW (ref 3.87–5.11)
RDW: 16.8 % — ABNORMAL HIGH (ref 11.5–15.5)
WBC: 3.1 10*3/uL — ABNORMAL LOW (ref 4.0–10.5)
nRBC: 0 % (ref 0.0–0.2)

## 2022-01-03 LAB — COMPREHENSIVE METABOLIC PANEL
ALT: 18 U/L (ref 0–44)
AST: 19 U/L (ref 15–41)
Albumin: 4.3 g/dL (ref 3.5–5.0)
Alkaline Phosphatase: 59 U/L (ref 38–126)
Anion gap: 7 (ref 5–15)
BUN: 22 mg/dL (ref 8–23)
CO2: 28 mmol/L (ref 22–32)
Calcium: 9 mg/dL (ref 8.9–10.3)
Chloride: 109 mmol/L (ref 98–111)
Creatinine, Ser: 0.89 mg/dL (ref 0.44–1.00)
GFR, Estimated: >60 mL/min (ref >60)
Glucose, Bld: 99 mg/dL (ref 70–99)
Potassium: 4.1 mmol/L (ref 3.5–5.1)
Sodium: 144 mmol/L (ref 135–145)
Total Bilirubin: 0.2 mg/dL — ABNORMAL LOW (ref 0.3–1.2)
Total Protein: 6.7 g/dL (ref 6.5–8.1)

## 2022-01-03 LAB — MAGNESIUM: Magnesium: 2.2 mg/dL (ref 1.7–2.4)

## 2022-01-03 MED ORDER — ACETAMINOPHEN 325 MG PO TABS
ORAL_TABLET | ORAL | Status: AC
  Filled 2022-01-03: qty 2

## 2022-01-03 MED ORDER — POTASSIUM CHLORIDE CRYS ER 20 MEQ PO TBCR: 40.0000 meq | EXTENDED_RELEASE_TABLET | Freq: Once | ORAL | Status: DC

## 2022-01-03 MED ORDER — BORTEZOMIB CHEMO SQ INJECTION 3.5 MG (2.5MG/ML)
1.0000 mg/m2 | Freq: Once | INTRAMUSCULAR | Status: AC
  Administered 2022-01-03: 1.75 mg via SUBCUTANEOUS
  Filled 2022-01-03: qty 0.7

## 2022-01-03 MED ORDER — LORATADINE 10 MG PO TABS
ORAL_TABLET | ORAL | Status: AC
  Filled 2022-01-03: qty 1

## 2022-01-03 NOTE — Progress Notes (Signed)
Patient presents today for Velcade injection per providers order.  Vital signs within parameters for treatment.  Labs reviewed and ANC noted to be 1.3, MD notified.  Message received from Dr. Delton Coombes okay to proceed with treatment.   Velcade administration without incident; injection site WNL; see MAR for injection details.  Patient tolerated procedure well and without incident.  No questions or complaints noted at this time. Discharge from clinic ambulatory in stable condition.  Alert and oriented X 3.  Follow up with [facility] as scheduled.

## 2022-01-03 NOTE — Patient Instructions (Signed)
Courtney Rogers  Discharge Instructions: Thank you for choosing [facility] to provide your oncology and hematology care.  If you have a lab appointment with the [facility], please come in thru the Main Entrance and check in at the main information desk.  Wear comfortable clothing and clothing appropriate for easy access to any Portacath or PICC line.   We strive to give you quality time with your provider. You may need to reschedule your appointment if you arrive late (15 or more minutes).  Arriving late affects you and other patients whose appointments are after yours.  Also, if you miss three or more appointments without notifying the office, you may be dismissed from the clinic at the provider's discretion.      For prescription refill requests, have your pharmacy contact our office and allow 72 hours for refills to be completed.    Today you received the following chemotherapy and/or immunotherapy agents Velcade      To help prevent nausea and vomiting after your treatment, we encourage you to take your nausea medication as directed.  BELOW ARE SYMPTOMS THAT SHOULD BE REPORTED IMMEDIATELY: *FEVER GREATER THAN 100.4 F (38 C) OR HIGHER *CHILLS OR SWEATING *NAUSEA AND VOMITING THAT IS NOT CONTROLLED WITH YOUR NAUSEA MEDICATION *UNUSUAL SHORTNESS OF BREATH *UNUSUAL BRUISING OR BLEEDING *URINARY PROBLEMS (pain or burning when urinating, or frequent urination) *BOWEL PROBLEMS (unusual diarrhea, constipation, pain near the anus) TENDERNESS IN MOUTH AND THROAT WITH OR WITHOUT PRESENCE OF ULCERS (sore throat, sores in mouth, or a toothache) UNUSUAL RASH, SWELLING OR PAIN  UNUSUAL VAGINAL DISCHARGE OR ITCHING   Items with * indicate a potential emergency and should be followed up as soon as possible or go to the Emergency Department if any problems should occur.  Please show the CHEMOTHERAPY ALERT CARD or IMMUNOTHERAPY ALERT CARD at check-in to the Emergency  Department and triage nurse.  Should you have questions after your visit or need to cancel or reschedule your appointment, please contact [facility] [phone]  and follow the prompts.  Office hours are 8:00 a.m. to 4:30 p.m. Monday - Friday. Please note that voicemails left after 4:00 p.m. may not be returned until the following business day.  We are closed weekends and major holidays. You have access to a nurse at all times for urgent questions. Please call the main number to the clinic [phone] and follow the prompts.  For any non-urgent questions, you may also contact your provider using MyChart. We now offer e-Visits for anyone 9 and older to request care online for non-urgent symptoms. For details visit [email or web address].   Also download the MyChart app! Go to the app store, search "MyChart", open the app, select [facility], and log in with your MyChart username and password.  Masks are optional in the cancer centers. If you would like for your care team to wear a mask while they are taking care of you, please let them know. For doctor visits, patients may have with them one support person who is at least 73 years old. At this time, visitors are not allowed in the infusion area.

## 2022-01-03 NOTE — Progress Notes (Signed)
Per MD ok to treat with ANC 1.3

## 2022-01-07 ENCOUNTER — Telehealth: Payer: BC Managed Care – PPO

## 2022-01-07 ENCOUNTER — Emergency Department (HOSPITAL_COMMUNITY)
Admission: EM | Admit: 2022-01-07 | Discharge: 2022-01-07 | Disposition: A | Discharge status: Home / Self Care | Payer: Medicare Other | Attending: Emergency Medicine | Admitting: Emergency Medicine

## 2022-01-07 ENCOUNTER — Encounter (HOSPITAL_COMMUNITY): Payer: Self-pay | Admitting: Emergency Medicine

## 2022-01-07 ENCOUNTER — Other Ambulatory Visit: Payer: Self-pay

## 2022-01-07 ENCOUNTER — Emergency Department (HOSPITAL_COMMUNITY): Payer: Medicare Other

## 2022-01-07 DIAGNOSIS — Z79899 Other long term (current) drug therapy: Secondary | ICD-10-CM | POA: Insufficient documentation

## 2022-01-07 DIAGNOSIS — R0789 Other chest pain: Secondary | ICD-10-CM | POA: Insufficient documentation

## 2022-01-07 DIAGNOSIS — Z7982 Long term (current) use of aspirin: Secondary | ICD-10-CM | POA: Insufficient documentation

## 2022-01-07 LAB — BASIC METABOLIC PANEL
Anion gap: 6 (ref 5–15)
BUN: 22 mg/dL (ref 8–23)
CO2: 30 mmol/L (ref 22–32)
Calcium: 8.2 mg/dL — ABNORMAL LOW (ref 8.9–10.3)
Chloride: 105 mmol/L (ref 98–111)
Creatinine, Ser: 0.83 mg/dL (ref 0.44–1.00)
GFR, Estimated: >60 mL/min (ref >60)
Glucose, Bld: 91 mg/dL (ref 70–99)
Potassium: 3.8 mmol/L (ref 3.5–5.1)
Sodium: 141 mmol/L (ref 135–145)

## 2022-01-07 LAB — CBC
HCT: 35 % — ABNORMAL LOW (ref 36.0–46.0)
Hemoglobin: 11.3 g/dL — ABNORMAL LOW (ref 12.0–15.0)
MCH: 32.8 pg (ref 26.0–34.0)
MCHC: 32.3 g/dL (ref 30.0–36.0)
MCV: 101.4 fL — ABNORMAL HIGH (ref 80.0–100.0)
Platelets: 83 10*3/uL — ABNORMAL LOW (ref 150–400)
RBC: 3.45 MIL/uL — ABNORMAL LOW (ref 3.87–5.11)
RDW: 17.1 % — ABNORMAL HIGH (ref 11.5–15.5)
WBC: 3.1 10*3/uL — ABNORMAL LOW (ref 4.0–10.5)
nRBC: 0 % (ref 0.0–0.2)

## 2022-01-07 LAB — TROPONIN I (HIGH SENSITIVITY)
Troponin I (High Sensitivity): 10 ng/L (ref <18)
Troponin I (High Sensitivity): 12 ng/L (ref <18)

## 2022-01-07 NOTE — ED Provider Notes (Signed)
[facility] EMERGENCY DEPARTMENT Provider Note   CSN: [id number] Arrival date & time: 01/07/22  1242     History {Add pertinent medical, surgical, social history, OB history to HPI:1} Chief Complaint  Patient presents with   Chest Pain    Courtney Rogers is a 71 y.o. female.  Patient has a history of myeloma.  She complains of periodic chest discomfort for the last 2 to 3 weeks.  Pain is not worse with inspiration   Chest Pain      Home Medications Prior to Admission medications   Medication Sig Start Date End Date Taking? Authorizing Provider  acetaminophen (TYLENOL) 500 MG tablet Take 1,000 mg by mouth 2 (two) times daily as needed for moderate pain or headache.    [provider]  acyclovir (ZOVIRAX) 400 MG tablet Take 1 tablet (400 mg total) by mouth 2 (two) times daily. 08/09/21   Derek Jack, MD  albuterol (PROVENTIL HFA;VENTOLIN HFA) 108 (90 BASE) MCG/ACT inhaler Inhale 2 puffs into the lungs every 6 (six) hours as needed for shortness of breath.    [provider]  Ascorbic Acid 500 MG CHEW Chew by mouth.    [provider]  aspirin EC 81 MG tablet Take 81 mg by mouth at bedtime.    [provider]  B Complex Vitamins (VITAMIN B COMPLEX) TABS Take 1 tablet by mouth daily.    [provider]  bortezomib SQ (VELCADE) 3.5 MG injection Inject into the skin once. 08/10/21   [provider]  busPIRone (BUSPAR) 5 MG tablet Take 1 tablet (5 mg total) by mouth daily. 09/23/21   Renee Rival, FNP  Calcium Carbonate-Vitamin D (CALCIUM 600+D PO) Take 1 tablet by mouth 2 (two) times daily.    [provider]  chlorthalidone (HYGROTON) 25 MG tablet Take 25 mg by mouth daily.     [provider]  cholecalciferol (VITAMIN D) 25 MCG (1000 UNIT) tablet Take 1,000 Units by mouth daily. 10/15/20   [provider]  cyanocobalamin 1000 MCG tablet Take 1 tablet by mouth daily. 10/12/20   [provider]  cyclobenzaprine (FLEXERIL) 10 MG tablet TAKE 1/2 TABLET BY MOUTH TWICE DAILY AS NEEDED FOR MUSCLE SPASMS. 12/17/21   Paseda, Dewaine Conger, FNP  Daratumumab-Hyaluronidase-fihj (DARZALEX FASPRO [place]) Inject into the skin. 08/10/21   [provider]  diclofenac Sodium (VOLTAREN) 1 % GEL Apply 4 g topically 4 (four) times daily. 11/19/21   Alvira Monday, FNP  fluticasone (FLONASE) 50 MCG/ACT nasal spray USE 2 SPRAYS IN EACH NOSTRIL ONCE DAILY. 10/06/21   Fayrene Helper, MD  folic acid (FOLVITE) 1 MG tablet TAKE 1 TABLET BY MOUTH ONCE DAILY. Patient taking differently: Take 1 mg by mouth daily. 02/09/21   Derek Jack, MD  gabapentin (NEURONTIN) 600 MG tablet Take 600 mg by mouth 2 (two) times daily. 05/07/19   [provider]  hydrochlorothiazide (MICROZIDE) 12.5 MG capsule Take 12.5 mg by mouth daily. 11/04/21   [provider]  hydrOXYzine (VISTARIL) 25 MG capsule     [provider]  Hyoscyamine Sulfate SL 0.125 MG SUBL     [provider]  lenalidomide (REVLIMID) 15 MG capsule Take 1 capsule (15 mg total) by mouth daily. 21 days on, 7 days off every 28 days 12/21/21   Derek Jack, MD  lidocaine (XYLOCAINE) 2 % solution TAKE 2 TEASPOONFULS BEFORE MEALS AND AT BEDTIME AS NEEDED MAY REPEAT EVERY 4 HOURS. (MAX OF 8 DOSES PER DAY)  06/02/21   Mahala Menghini, PA-C  Lidocaine HCl (XOLIDO) 2 % CREA Apply 1 application topically daily as needed (arthritisis).    [provider]  loperamide (IMODIUM) 2 MG capsule     [provider]  losartan (COZAAR) 100 MG tablet Take 100 mg by mouth daily. 01/16/21   [provider]  magnesium oxide (MAG-OX) 400 (240 Mg) MG tablet Take 1 tablet (400 mg total) by mouth 2 (two) times daily. 09/21/21   Derek Jack, MD  metoprolol tartrate (LOPRESSOR) 50 MG tablet Take 1 tablet (50 mg total) by mouth 2 (two) times daily. 10/07/21   Paseda, Dewaine Conger, FNP  montelukast  (SINGULAIR) 10 MG tablet Take 10 mg by mouth daily.     [provider]  MYRBETRIQ 25 MG TB24 tablet TAKE 1 TABLET BY MOUTH ONCE DAILY. 12/17/21   Paseda, Dewaine Conger, FNP  nitroGLYCERIN (NITROSTAT) 0.4 MG SL tablet Place 0.4 mg under the tongue every 5 (five) minutes as needed for chest pain.    [provider]  ondansetron (ZOFRAN-ODT) 4 MG disintegrating tablet     [provider]  pantoprazole (PROTONIX) 40 MG tablet Take 40 mg by mouth daily. 08/10/20   [provider]  potassium chloride SA (KLOR-CON M) 20 MEQ tablet Take 1 tablet (20 mEq total) by mouth 3 (three) times daily. 05/28/08   Delora Fuel, MD  primidone (MYSOLINE) 50 MG tablet Take 50 mg by mouth daily. 03/31/21   [provider]  Probiotic Product (GNP PROBIOTIC COLON SUPPORT) CAPS Take 1 capsule by mouth daily. 07/08/21   [provider]  sertraline (ZOLOFT) 100 MG tablet Take 200 mg by mouth daily.     [provider]  simvastatin (ZOCOR) 40 MG tablet Take 40 mg by mouth at bedtime.    [provider]  sucralfate (CARAFATE) 1 GM/10ML suspension     [provider]  warfarin (COUMADIN) 3 MG tablet Take 3 mg by mouth daily. 06/23/21   [provider]      Allergies    Amoxicillin-pot clavulanate, Biaxin [clarithromycin], Erythromycin, and Lisinopril    Review of Systems   Review of Systems  Cardiovascular:  Positive for chest pain.    Physical Exam Updated Vital Signs BP (!) 150/73   Pulse (!) 59   Temp (!) 97.5 F (36.4 C) (Oral)   Resp 14   Ht '5\' 4"'$  (1.626 m)   Wt 72.5 kg   SpO2 97%   BMI 27.44 kg/m  Physical Exam  ED Results / Procedures / Treatments   Labs (all labs ordered are listed, but only abnormal results are displayed) Labs Reviewed  BASIC METABOLIC PANEL - Abnormal; Notable for the following components:      Result Value   Calcium 8.2 (*)    All other components within normal limits  CBC - Abnormal; Notable for  the following components:   WBC 3.1 (*)    RBC 3.45 (*)    Hemoglobin 11.3 (*)    HCT 35.0 (*)    MCV 101.4 (*)    RDW 17.1 (*)    Platelets 83 (*)    All other components within normal limits  TROPONIN I (HIGH SENSITIVITY)  TROPONIN I (HIGH SENSITIVITY)    EKG EKG Interpretation  Date/Time:  Friday January 07 2022 12:57:11 EDT Ventricular Rate:  64 PR Interval:  146 QRS Duration: 164 QT Interval:  534 QTC Calculation: 550 R Axis:   -68 Text Interpretation: AV dual-paced  rhythm Abnormal ECG When compared with ECG of 20-Sep-2021 11:50, PREVIOUS ECG IS PRESENT No significant change since last tracing Confirmed by Varney Biles [phone]) on 01/07/2022 1:23:50 PM  Radiology DG Chest 2 View  Result Date: 01/07/2022 CLINICAL DATA:  Chest pain EXAM: CHEST - 2 VIEW COMPARISON:  09/26/2021 and multiple prior radiographs. CT 09/20/2021 FINDINGS: Unchanged car mediastinal silhouette dual chamber pacemaker leads. Unchanged right lung and left basilar scarring. There is no new focal airspace disease. No pleural effusion. No pneumothorax. No acute osseous abnormality. Thoracic spondylosis. IMPRESSION: Stable lung scarring.  No new focal airspace disease. Electronically Signed   By: Maurine Simmering M.D.   On: 01/07/2022 13:20    Procedures Procedures  {Document cardiac monitor, telemetry assessment procedure when appropriate:1}  Medications Ordered in ED Medications - No data to display  ED Course/ Medical Decision Making/ A&P                           Medical Decision Making Amount and/or Complexity of Data Reviewed Labs: ordered. Radiology: ordered.   Patient had 2 normal troponins.  Doubt chest discomfort is cardiac related.  She will be discharged home to follow-up with her doctor  {Document critical care time when appropriate:1} {Document review of labs and clinical decision tools ie heart score, Chads2Vasc2 etc:1}  {Document your independent review of radiology images, and any  outside records:1} {Document your discussion with family members, caretakers, and with consultants:1} {Document social determinants of health affecting pt's care:1} {Document your decision making why or why not admission, treatments were needed:1} Final Clinical Impression(s) / ED Diagnoses Final diagnoses:  Atypical chest pain    Rx / DC Orders ED Discharge Orders     None

## 2022-01-07 NOTE — ED Triage Notes (Addendum)
Pt presents with intermittent CP for 2 weeks, sent from UC for evaluation, pt has pacemaker.

## 2022-01-07 NOTE — Discharge Instructions (Signed)
Take Tylenol for pain and follow-up with your doctor next week if not improving

## 2022-01-11 ENCOUNTER — Inpatient Hospital Stay (HOSPITAL_COMMUNITY): Payer: Medicare Other

## 2022-01-12 ENCOUNTER — Inpatient Hospital Stay: Payer: Medicare Other

## 2022-01-12 DIAGNOSIS — C9000 Multiple myeloma not having achieved remission: Secondary | ICD-10-CM

## 2022-01-12 DIAGNOSIS — Z5112 Encounter for antineoplastic immunotherapy: Secondary | ICD-10-CM | POA: Diagnosis not present

## 2022-01-12 LAB — CBC WITH DIFFERENTIAL/PLATELET
Abs Immature Granulocytes: 0.01 10*3/uL (ref 0.00–0.07)
Basophils Absolute: 0.1 10*3/uL (ref 0.0–0.1)
Basophils Relative: 1 %
Eosinophils Absolute: 0.3 10*3/uL (ref 0.0–0.5)
Eosinophils Relative: 8 %
HCT: 35.2 % — ABNORMAL LOW (ref 36.0–46.0)
Hemoglobin: 11.4 g/dL — ABNORMAL LOW (ref 12.0–15.0)
Immature Granulocytes: 0 %
Lymphocytes Relative: 22 %
Lymphs Abs: 0.8 10*3/uL (ref 0.7–4.0)
MCH: 32.4 pg (ref 26.0–34.0)
MCHC: 32.4 g/dL (ref 30.0–36.0)
MCV: 100 fL (ref 80.0–100.0)
Monocytes Absolute: 0.7 10*3/uL (ref 0.1–1.0)
Monocytes Relative: 18 %
Neutro Abs: 1.9 10*3/uL (ref 1.7–7.7)
Neutrophils Relative %: 51 %
Platelets: 85 10*3/uL — ABNORMAL LOW (ref 150–400)
RBC: 3.52 MIL/uL — ABNORMAL LOW (ref 3.87–5.11)
RDW: 16.6 % — ABNORMAL HIGH (ref 11.5–15.5)
WBC: 3.8 10*3/uL — ABNORMAL LOW (ref 4.0–10.5)
nRBC: 0 % (ref 0.0–0.2)

## 2022-01-12 LAB — COMPREHENSIVE METABOLIC PANEL
ALT: 17 U/L (ref 0–44)
AST: 18 U/L (ref 15–41)
Albumin: 4 g/dL (ref 3.5–5.0)
Alkaline Phosphatase: 43 U/L (ref 38–126)
Anion gap: 5 (ref 5–15)
BUN: 17 mg/dL (ref 8–23)
CO2: 28 mmol/L (ref 22–32)
Calcium: 8 mg/dL — ABNORMAL LOW (ref 8.9–10.3)
Chloride: 104 mmol/L (ref 98–111)
Creatinine, Ser: 0.84 mg/dL (ref 0.44–1.00)
GFR, Estimated: >60 mL/min (ref >60)
Glucose, Bld: 86 mg/dL (ref 70–99)
Potassium: 3.7 mmol/L (ref 3.5–5.1)
Sodium: 137 mmol/L (ref 135–145)
Total Bilirubin: 1 mg/dL (ref 0.3–1.2)
Total Protein: 6.5 g/dL (ref 6.5–8.1)

## 2022-01-12 LAB — MAGNESIUM: Magnesium: 2 mg/dL (ref 1.7–2.4)

## 2022-01-13 ENCOUNTER — Ambulatory Visit (INDEPENDENT_AMBULATORY_CARE_PROVIDER_SITE_OTHER): Payer: Medicare Other | Admitting: Internal Medicine

## 2022-01-13 ENCOUNTER — Other Ambulatory Visit: Payer: Self-pay

## 2022-01-13 ENCOUNTER — Encounter: Payer: Self-pay | Admitting: Internal Medicine

## 2022-01-13 VITALS — BP 124/76 | HR 84 | Resp 18 | Ht 63.0 in | Wt 162.4 lb

## 2022-01-13 DIAGNOSIS — Z09 Encounter for follow-up examination after completed treatment for conditions other than malignant neoplasm: Secondary | ICD-10-CM

## 2022-01-13 DIAGNOSIS — Z950 Presence of cardiac pacemaker: Secondary | ICD-10-CM

## 2022-01-13 DIAGNOSIS — R0789 Other chest pain: Secondary | ICD-10-CM | POA: Diagnosis not present

## 2022-01-13 LAB — KAPPA/LAMBDA LIGHT CHAINS
Kappa free light chain: 13.6 mg/L (ref 3.3–19.4)
Kappa, lambda light chain ratio: 1.84 — ABNORMAL HIGH (ref 0.26–1.65)
Lambda free light chains: 7.4 mg/L (ref 5.7–26.3)

## 2022-01-13 MED ORDER — LENALIDOMIDE 15 MG PO CAPS: 15.0000 mg | ORAL_CAPSULE | Freq: Every day | ORAL | 0 refills | Status: DC

## 2022-01-13 NOTE — Patient Instructions (Signed)
Please take Tylenol for chest discomfort, as it seems it is coming from muscle soreness.  If you have chest pain with shortness of breath or palpitations, please take Nitroglycerin and if it doesn't resolve, please go to ER.

## 2022-01-13 NOTE — Telephone Encounter (Signed)
Chart reviewed. Revlimid refilled per last office note with Dr. Katragadda.  

## 2022-01-13 NOTE — Progress Notes (Signed)
Acute Office Visit  Subjective:    Patient ID: Courtney Rogers, female    DOB: March 23, 1951, 71 y.o.   MRN: [id number]  Chief Complaint  Patient presents with   Follow-up    ER follow up pt was seen in ER 01-08-22 for chest pain she is still having the chest pains it comes and goes and hurts when she moves     HPI Patient is in today for ER visit follow-up.  She went to ER for episode of chest pain, which are intermittent, unrelated to activity and feels like soreness in her upper chest wall area.  She does report having recent URTI about a week ago, but denies any nasal congestion, cough, dyspnea or wheezing currently.  Her EKG did not show any signs of active ischemia and cardiac enzymes were wnl.  She has a dual chamber pacemaker and follows up with cardiologist in [place].  She has tried taking Tylenol for chest pain with some relief.  She also takes pantoprazole for GERD.  Past Medical History:  Diagnosis Date   Acute myocardial infarction [facility]) 2009   CAD/no stent medically managed   Anxiety disorder    CAD (coronary artery disease)    Cancer (HCC)    Hyperlipidemia    Hypertension    IBS (irritable bowel syndrome)    Non-ulcer dyspepsia 04/30/2009   Qualifier: Diagnosis of  By: Oneida Alar MD, Sandi L    Overactive bladder    PE (pulmonary embolism) 10/2012   Presence of permanent cardiac pacemaker    Sleep apnea     Past Surgical History:  Procedure Laterality Date   ABDOMINAL HYSTERECTOMY     BSO secondary to cyst     CARDIAC CATHETERIZATION     COLONOSCOPY  12/2009   Dr. [place] Carbo, propofol, normal. Next TCS 12/2019   COLONOSCOPY N/A 05/22/2017   Examined portion ileum was normal, significant looping of colon, external hemorrhoids and rectal bleeding due to internal hemorrhoids, mild diverticulosis procedure: COLONOSCOPY;  Surgeon: Danie Binder, MD;  Location: [facility];  Service: Endoscopy;  Laterality: N/A;  10:30am   ESOPHAGOGASTRODUODENOSCOPY  05/11/2009    schatzki ring/small hiatal hernia/path:gastritis   ESOPHAGOGASTRODUODENOSCOPY N/A 05/22/2017   Multiple gastric polyps with biopsy benign fundic gland polyp, small bowel biopsy negative for celiac, gastric biopsy with mild gastritis but no H. pylori procedure: ESOPHAGOGASTRODUODENOSCOPY (EGD);  Surgeon: Danie Binder, MD;  Location: [facility];  Service: Endoscopy;  Laterality: N/A;   ESOPHAGOGASTRODUODENOSCOPY (EGD) WITH ESOPHAGEAL DILATION N/A 04/01/2013   [id number] at the gastroesophageal juction/multiple small polyps/mild gastritis   GIVENS CAPSULE STUDY N/A 06/07/2017   Frequent gastric erosions, normal small bowel mucosa.  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    Family History  Problem Relation Age of Onset   Colon polyps Neg Hx    Colon cancer Neg Hx     Social History   Socioeconomic History   Marital status: Married    Spouse name: [name]   Number of children: 3   Years of education: 16   Highest education level: Not on file  Occupational History   Not on file  Tobacco Use   Smoking status: Never   Smokeless tobacco: Never   Tobacco comments:  Never smoked   Vaping Use   Vaping Use: Never used  Substance and Sexual Activity   Alcohol use: Yes    Alcohol/week: 0.0 standard drinks of alcohol    Comment: occ wine   Drug use: No   Sexual activity: Not Currently  Other Topics Concern   Not on file  Social History Narrative      Lives with husband-51 years 21 in Aug 2021   Daughter is close by, 2 sons live further away   One IN [place],ONE IN [place], [place].        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   Enjoys: reading, young adult      Diet: eats all food groups    Caffeine: coffee 1, tea daily soda-1 daily    Water: 1-2 cups      Wears seat belt    Does not use phone while driving   Smoke detectors at home    Public house manager -locked up      Left handed   One story home   Drinks caffeine   Social Determinants of Health   Financial Resource Strain: Low Risk  (11/15/2021)   Overall Financial Resource Strain (CARDIA)    Difficulty of Paying Living Expenses: Not hard at all  Food Insecurity: No Food Insecurity (11/15/2021)   Hunger Vital Sign    Worried About Running Out of Food in the Last Year: Never true    Ran Out of Food in the Last Year: Never true  Transportation Needs: No Transportation Needs (11/15/2021)   PRAPARE - Hydrologist (Medical): No    Lack of Transportation (Non-Medical): No  Physical Activity: Inactive (11/26/2021)   Exercise Vital Sign    Days of Exercise per Week: 0 days    Minutes of Exercise per Session: 0 min  Stress: Stress Concern Present (11/26/2021)   [place]    Feeling of Stress : To some extent  Social Connections: Moderately Integrated (11/11/2020)   Social Connection and Isolation Panel [NHANES]    Frequency of Communication with Friends and Family: Once a week    Frequency of Social Gatherings with Friends and Family: Once a week    Attends Religious Services: More than 4 times per year    Active Member of [employer] or Organizations: Yes    Attends Music therapist: More than 4 times per year    Marital Status: Married  Human resources officer Violence: Not At Risk (11/11/2020)   Humiliation, Afraid, Rape, and Kick questionnaire    Fear of Current or Ex-Partner: No    Emotionally Abused: No    Physically Abused: No    Sexually Abused: No    Outpatient Medications Prior to Visit  Medication Sig Dispense Refill   acetaminophen (TYLENOL) 500 MG tablet Take 1,000 mg by mouth 2 (two) times daily as needed for moderate pain or headache.      acyclovir (ZOVIRAX) 400 MG tablet Take 1 tablet (400 mg total) by mouth 2 (two) times daily. 60 tablet 6   albuterol (PROVENTIL HFA;VENTOLIN HFA) 108 (90 BASE) MCG/ACT inhaler Inhale 2 puffs into the lungs every 6 (six) hours as needed for shortness of breath.     Ascorbic Acid 500 MG CHEW Chew by mouth.     aspirin EC 81 MG tablet Take 81 mg by mouth at bedtime.     B Complex Vitamins (VITAMIN B  COMPLEX) TABS Take 1 tablet by mouth daily.     bortezomib SQ (VELCADE) 3.5 MG injection Inject into the skin once.     busPIRone (BUSPAR) 5 MG tablet Take 1 tablet (5 mg total) by mouth daily. 30 tablet 3   Calcium Carbonate-Vitamin D (CALCIUM 600+D PO) Take 1 tablet by mouth 2 (two) times daily.     chlorthalidone (HYGROTON) 25 MG tablet Take 25 mg by mouth daily.      cholecalciferol (VITAMIN D) 25 MCG (1000 UNIT) tablet Take 1,000 Units by mouth daily.     cyanocobalamin 1000 MCG tablet Take 1 tablet by mouth daily.     cyclobenzaprine (FLEXERIL) 10 MG tablet TAKE 1/2 TABLET BY MOUTH TWICE DAILY AS NEEDED FOR MUSCLE SPASMS. 20 tablet 0   Daratumumab-Hyaluronidase-fihj (DARZALEX FASPRO [place]) Inject into the skin.     diclofenac Sodium (VOLTAREN) 1 % GEL Apply 4 g topically 4 (four) times daily. 50 g 0   fluticasone (FLONASE) 50 MCG/ACT nasal spray USE 2 SPRAYS IN EACH NOSTRIL ONCE DAILY. 16 g 0   folic acid (FOLVITE) 1 MG tablet TAKE 1 TABLET BY MOUTH ONCE DAILY. (Patient taking differently: Take 1 mg by mouth daily.) 30 tablet 11   gabapentin (NEURONTIN) 600 MG tablet Take 600 mg by mouth 2 (two) times daily.     hydrochlorothiazide (MICROZIDE) 12.5 MG capsule Take 12.5 mg by mouth daily.     hydrOXYzine (VISTARIL) 25 MG capsule      Hyoscyamine Sulfate SL 0.125 MG SUBL      lenalidomide (REVLIMID) 15 MG capsule Take 1 capsule (15 mg total) by mouth daily. 21 days on, 7 days off every 28 days 21 capsule 0   lidocaine (XYLOCAINE) 2 % solution TAKE 2 TEASPOONFULS BEFORE MEALS AND AT BEDTIME AS NEEDED  MAY REPEAT EVERY 4 HOURS. (MAX OF 8 DOSES PER DAY) 300 mL 0   Lidocaine HCl (XOLIDO) 2 % CREA Apply 1 application topically daily as needed (arthritisis).     loperamide (IMODIUM) 2 MG capsule      losartan (COZAAR) 100 MG tablet Take 100 mg by mouth daily.     magnesium oxide (MAG-OX) 400 (240 Mg) MG tablet Take 1 tablet (400 mg total) by mouth 2 (two) times daily. 60 tablet 4   metoprolol tartrate (LOPRESSOR) 50 MG tablet Take 1 tablet (50 mg total) by mouth 2 (two) times daily. 180 tablet 1   montelukast (SINGULAIR) 10 MG tablet Take 10 mg by mouth daily.      MYRBETRIQ 25 MG TB24 tablet TAKE 1 TABLET BY MOUTH ONCE DAILY. 30 tablet 0   nitroGLYCERIN (NITROSTAT) 0.4 MG SL tablet Place 0.4 mg under the tongue every 5 (five) minutes as needed for chest pain.     ondansetron (ZOFRAN-ODT) 4 MG disintegrating tablet      pantoprazole (PROTONIX) 40 MG tablet Take 40 mg by mouth daily.     potassium chloride SA (KLOR-CON M) 20 MEQ tablet Take 1 tablet (20 mEq total) by mouth 3 (three) times daily. 21 tablet 0   primidone (MYSOLINE) 50 MG tablet Take 50 mg by mouth daily.     Probiotic Product (GNP PROBIOTIC COLON SUPPORT) CAPS Take 1 capsule by mouth daily.     sertraline (ZOLOFT) 100 MG tablet Take 200 mg by mouth daily.      simvastatin (ZOCOR) 40 MG tablet Take 40 mg by mouth at bedtime.     sucralfate (CARAFATE) 1 GM/10ML suspension  warfarin (COUMADIN) 3 MG tablet Take 3 mg by mouth daily.     No facility-administered medications prior to visit.    Allergies  Allergen Reactions   Amoxicillin-Pot Clavulanate Other (See Comments)   Biaxin [Clarithromycin] Other (See Comments)    Stomach problems    Erythromycin    Lisinopril Swelling    Review of Systems  Constitutional:  Positive for fatigue. Negative for chills and fever.  Respiratory:  Negative for cough and shortness of breath.   Cardiovascular:  Positive for chest pain. Negative for palpitations.  Gastrointestinal:  Negative  for diarrhea, nausea and vomiting.  Genitourinary:  Negative for dysuria and hematuria.  Musculoskeletal:  Negative for neck pain and neck stiffness.  Skin:  Negative for rash.  Neurological:  Positive for weakness. Negative for dizziness.  Psychiatric/Behavioral:  Negative for agitation and behavioral problems.        Objective:    Physical Exam Constitutional:      General: She is not in acute distress.    Appearance: She is not diaphoretic.     Comments: Has a rolling walker  HENT:     Nose: No congestion.     Mouth/Throat:     Mouth: Mucous membranes are moist.  Eyes:     General: No scleral icterus.    Extraocular Movements: Extraocular movements intact.  Cardiovascular:     Rate and Rhythm: Normal rate and regular rhythm.     Heart sounds: Normal heart sounds. No murmur heard. Pulmonary:     Breath sounds: Normal breath sounds. No wheezing or rales.  Chest:     Chest wall: Tenderness (Upper chest wall area) present.  Musculoskeletal:     Right lower leg: No edema.     Left lower leg: No edema.  Skin:    General: Skin is warm.     Findings: No rash.  Neurological:     General: No focal deficit present.     Mental Status: She is alert and oriented to person, place, and time.  Psychiatric:        Mood and Affect: Mood normal.        Behavior: Behavior normal.     BP 124/76 (BP Location: Right Arm, Patient Position: Sitting, Cuff Size: Normal)   Pulse 84   Resp 18   Ht '5\' 3"'$  (1.6 m)   Wt 162 lb 6.4 oz (73.7 kg)   SpO2 98%   BMI 28.77 kg/m  Wt Readings from Last 3 Encounters:  01/13/22 162 lb 6.4 oz (73.7 kg)  01/07/22 159 lb 13.3 oz (72.5 kg)  01/03/22 159 lb 12.8 oz (72.5 kg)        Assessment & Plan:   Problem List Items Addressed This Visit       Other   Cardiac pacemaker in situ    Followed by cardiology in [facility]      Atypical chest pain - Primary    Intermittent episodes of chest pain with chest wall tenderness, likely costochondritis  (had a recent URTI) Recent ER visit chart reviewed, including blood tests and EKG Pain less likely to be cardiac in origin On Protonix for GERD Advised to take Tylenol as needed for chest wall pain Avoid NSAIDs due to cardiac history      Other Visit Diagnoses     Encounter for examination following treatment at hospital            No orders of the defined types were placed in this encounter.  Zofia Peckinpaugh K Kennah Hehr, MD 

## 2022-01-13 NOTE — Assessment & Plan Note (Signed)
Followed by cardiology in [place]

## 2022-01-13 NOTE — Assessment & Plan Note (Signed)
Intermittent episodes of chest pain with chest wall tenderness, likely costochondritis (had a recent URTI) Recent ER visit chart reviewed, including blood tests and EKG Pain less likely to be cardiac in origin On Protonix for GERD Advised to take Tylenol as needed for chest wall pain Avoid NSAIDs due to cardiac history

## 2022-01-14 LAB — PROTEIN ELECTROPHORESIS, SERUM
A/G Ratio: 1.8 — ABNORMAL HIGH (ref 0.7–1.7)
Albumin ELP: 3.7 g/dL (ref 2.9–4.4)
Alpha-1-Globulin: 0.2 g/dL (ref 0.0–0.4)
Alpha-2-Globulin: 0.6 g/dL (ref 0.4–1.0)
Beta Globulin: 0.8 g/dL (ref 0.7–1.3)
Gamma Globulin: 0.4 g/dL (ref 0.4–1.8)
Globulin, Total: 2.1 g/dL — ABNORMAL LOW (ref 2.2–3.9)
M-Spike, %: 0.2 g/dL — ABNORMAL HIGH
Total Protein ELP: 5.8 g/dL — ABNORMAL LOW (ref 6.0–8.5)

## 2022-01-17 LAB — IMMUNOFIXATION ELECTROPHORESIS
IgA: 67 mg/dL (ref 64–422)
IgG (Immunoglobin G), Serum: 402 mg/dL — ABNORMAL LOW (ref 586–1602)
IgM (Immunoglobulin M), Srm: 25 mg/dL — ABNORMAL LOW (ref 26–217)
Total Protein ELP: 5.8 g/dL — ABNORMAL LOW (ref 6.0–8.5)

## 2022-01-18 ENCOUNTER — Inpatient Hospital Stay (HOSPITAL_COMMUNITY): Payer: Medicare Other

## 2022-01-18 ENCOUNTER — Encounter: Payer: Self-pay | Admitting: Internal Medicine

## 2022-01-18 ENCOUNTER — Ambulatory Visit (INDEPENDENT_AMBULATORY_CARE_PROVIDER_SITE_OTHER): Payer: Medicare Other | Admitting: Internal Medicine

## 2022-01-18 ENCOUNTER — Inpatient Hospital Stay (HOSPITAL_COMMUNITY): Payer: Medicare Other | Admitting: Hematology

## 2022-01-18 DIAGNOSIS — R0789 Other chest pain: Secondary | ICD-10-CM

## 2022-01-18 DIAGNOSIS — M940 Chondrocostal junction syndrome [Tietze]: Secondary | ICD-10-CM | POA: Diagnosis not present

## 2022-01-18 MED ORDER — METHYLPREDNISOLONE 4 MG PO TBPK: ORAL_TABLET | ORAL | 0 refills | Status: DC

## 2022-01-18 NOTE — Assessment & Plan Note (Addendum)
Intermittent episodes of chest pain with chest wall tenderness, likely costochondritis (had a recent URTI) Recent ER visit chart reviewed, including blood tests and EKG Pain less likely to be cardiac in origin On Protonix for GERD Advised to take Tylenol as needed for chest wall pain Avoid NSAIDs due to cardiac history Started Medrol dosepak to reduce inflammation

## 2022-01-18 NOTE — Patient Instructions (Signed)
Please take Prednisone as prescribed.  Please take Pantoprazole regularly. If you have worse acid reflux, okay to take Pantoprazole twice daily while taking Prednisone.

## 2022-01-18 NOTE — Progress Notes (Signed)
Virtual Visit via Telephone Note   This visit type was conducted due to national recommendations for restrictions regarding the COVID-19 Pandemic (e.g. social distancing) in an effort to limit this patient's exposure and mitigate transmission in our community.  Due to her co-morbid illnesses, this patient is at least at moderate risk for complications without adequate follow up.  This format is felt to be most appropriate for this patient at this time.  The patient did not have access to video technology/had technical difficulties with video requiring transitioning to audio format only (telephone).  All issues noted in this document were discussed and addressed.  No physical exam could be performed with this format.  Evaluation Performed:  Follow-up visit  Date:  01/18/2022   ID:  Courtney Rogers, Courtney Rogers [birth year], MRN [id number]  Patient Location: Home Provider Location: Office/Clinic  Participants: Patient Location of Patient: Home Location of Provider: Telehealth Consent was obtain for visit to be over via telehealth. I verified that I am speaking with the correct person using two identifiers.  PCP:  Renee Rival, FNP   Chief Complaint: Chest pain  History of Present Illness:    Courtney Rogers is a 71 y.o. female who has a televisit for complaint of episodes of chest pain for the last 2 weeks, which are intermittent, unrelated to activity and feels like soreness in her upper chest wall area.  She does report having recent URTI about a week before her chest pain started, but denies any nasal congestion, cough, dyspnea or wheezing currently.   Her EKG did not show any signs of active ischemia and cardiac enzymes were wnl.  She has tried taking Tylenol for chest pain with some relief.  The patient does not have symptoms concerning for COVID-19 infection (fever, chills, cough, or new shortness of breath).   Past Medical, Surgical, Social History, Allergies, and Medications  have been Reviewed.  Past Medical History:  Diagnosis Date   Acute myocardial infarction [facility]) 2009   CAD/no stent medically managed   Anxiety disorder    CAD (coronary artery disease)    Cancer (HCC)    Hyperlipidemia    Hypertension    IBS (irritable bowel syndrome)    Non-ulcer dyspepsia 04/30/2009   Qualifier: Diagnosis of  By: Oneida Alar MD, Sandi L    Overactive bladder    PE (pulmonary embolism) 10/2012   Presence of permanent cardiac pacemaker    Sleep apnea    Past Surgical History:  Procedure Laterality Date   ABDOMINAL HYSTERECTOMY     BSO secondary to cyst     CARDIAC CATHETERIZATION     COLONOSCOPY  12/2009   Dr. West Carbo, propofol, normal. Next TCS 12/2019   COLONOSCOPY N/A 05/22/2017   Examined portion ileum was normal, significant looping of colon, external hemorrhoids and rectal bleeding due to internal hemorrhoids, mild diverticulosis procedure: COLONOSCOPY;  Surgeon: Danie Binder, MD;  Location: [facility];  Service: Endoscopy;  Laterality: N/A;  10:30am   ESOPHAGOGASTRODUODENOSCOPY  05/11/2009   schatzki ring/small hiatal hernia/path:gastritis   ESOPHAGOGASTRODUODENOSCOPY N/A 05/22/2017   Multiple gastric polyps with biopsy benign fundic gland polyp, small bowel biopsy negative for celiac, gastric biopsy with mild gastritis but no H. pylori procedure: ESOPHAGOGASTRODUODENOSCOPY (EGD);  Surgeon: Danie Binder, MD;  Location: [facility];  Service: Endoscopy;  Laterality: N/A;   ESOPHAGOGASTRODUODENOSCOPY (EGD) WITH ESOPHAGEAL DILATION N/A 04/01/2013   [id number] at the gastroesophageal juction/multiple small polyps/mild gastritis   GIVENS CAPSULE STUDY N/A  06/07/2017   Frequent gastric erosions, normal small bowel mucosa.  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   acyclovir (ZOVIRAX) 400 MG tablet Take 1 tablet (400 mg total) by mouth 2 (two) times daily.   albuterol (PROVENTIL HFA;VENTOLIN HFA) 108 (90 BASE) MCG/ACT inhaler Inhale 2 puffs into the lungs every 6 (six) hours as needed for shortness of breath.   Ascorbic Acid 500 MG CHEW Chew by mouth.   aspirin EC 81 MG tablet Take 81 mg by mouth at bedtime.   B Complex Vitamins (VITAMIN B COMPLEX) TABS Take 1 tablet by mouth daily.   bortezomib SQ (VELCADE) 3.5 MG injection Inject into the skin once.   busPIRone (BUSPAR) 5 MG tablet Take 1 tablet (5 mg total) by mouth daily.   Calcium Carbonate-Vitamin D (CALCIUM 600+D PO) Take 1 tablet by mouth 2 (two) times daily.   chlorthalidone (HYGROTON) 25 MG tablet Take 25 mg by mouth daily.    cholecalciferol (VITAMIN D) 25 MCG (1000 UNIT) tablet Take 1,000 Units by mouth daily.   cyanocobalamin 1000 MCG tablet Take 1 tablet by mouth daily.   cyclobenzaprine (FLEXERIL) 10 MG tablet TAKE 1/2 TABLET BY MOUTH TWICE DAILY AS NEEDED FOR MUSCLE SPASMS.   Daratumumab-Hyaluronidase-fihj (DARZALEX FASPRO [place]) Inject into the skin.   diclofenac Sodium (VOLTAREN) 1 % GEL Apply 4 g topically 4 (four) times daily.   fluticasone (FLONASE) 50 MCG/ACT nasal spray USE 2 SPRAYS IN EACH NOSTRIL ONCE DAILY.   folic acid (FOLVITE) 1 MG tablet TAKE 1 TABLET BY MOUTH ONCE DAILY. (Patient taking differently: Take 1 mg by mouth daily.)   gabapentin (NEURONTIN) 600 MG tablet Take 600 mg by mouth 2 (two) times daily.   hydrochlorothiazide (MICROZIDE) 12.5 MG capsule Take 12.5 mg by mouth daily.   hydrOXYzine (VISTARIL) 25 MG capsule    Hyoscyamine Sulfate SL 0.125 MG SUBL    lenalidomide (REVLIMID) 15 MG capsule Take 1 capsule (15 mg total) by mouth daily. 21 days on, 7 days off every 28 days   lidocaine  (XYLOCAINE) 2 % solution TAKE 2 TEASPOONFULS BEFORE MEALS AND AT BEDTIME AS NEEDED MAY REPEAT EVERY 4 HOURS. (MAX OF 8 DOSES PER DAY)   Lidocaine HCl (XOLIDO) 2 % CREA Apply 1 application topically daily as needed (arthritisis).   loperamide (IMODIUM) 2 MG capsule    losartan (COZAAR) 100 MG tablet Take 100 mg by mouth daily.   magnesium oxide (MAG-OX) 400 (240 Mg) MG tablet Take 1 tablet (400 mg total) by mouth 2 (two) times daily.   metoprolol tartrate (LOPRESSOR) 50 MG tablet Take 1 tablet (50 mg total) by mouth 2 (two) times daily.   montelukast (SINGULAIR) 10 MG tablet Take 10 mg by mouth daily.    MYRBETRIQ 25 MG TB24 tablet TAKE 1 TABLET BY MOUTH ONCE DAILY.   nitroGLYCERIN (NITROSTAT) 0.4 MG SL tablet Place 0.4 mg under the tongue every 5 (five) minutes as needed for chest pain.   ondansetron (ZOFRAN-ODT) 4 MG disintegrating tablet    pantoprazole (PROTONIX) 40 MG tablet Take 40 mg by  mouth daily.   potassium chloride SA (KLOR-CON M) 20 MEQ tablet Take 1 tablet (20 mEq total) by mouth 3 (three) times daily.   primidone (MYSOLINE) 50 MG tablet Take 50 mg by mouth daily.   Probiotic Product (GNP PROBIOTIC COLON SUPPORT) CAPS Take 1 capsule by mouth daily.   sertraline (ZOLOFT) 100 MG tablet Take 200 mg by mouth daily.    simvastatin (ZOCOR) 40 MG tablet Take 40 mg by mouth at bedtime.   sucralfate (CARAFATE) 1 GM/10ML suspension    warfarin (COUMADIN) 3 MG tablet Take 3 mg by mouth daily.     Allergies:   Amoxicillin-pot clavulanate, Biaxin [clarithromycin], Erythromycin, and Lisinopril   ROS:   Please see the history of present illness.     All other systems reviewed and are negative.   Labs/Other Tests and Data Reviewed:    Recent Labs: 06/04/2021: TSH 0.644 01/12/2022: ALT 17; BUN 17; Creatinine, Ser 0.84; Hemoglobin 11.4; Magnesium 2.0; Platelets 85; Potassium 3.7; Sodium 137   Recent Lipid Panel Lab Results  Component Value Date/Time   CHOL 138 02/25/2021 01:14 PM    TRIG 71 02/25/2021 01:14 PM   HDL 55 02/25/2021 01:14 PM   LDLCALC 69 02/25/2021 01:14 PM    Wt Readings from Last 3 Encounters:  01/13/22 162 lb 6.4 oz (73.7 kg)  01/07/22 159 lb 13.3 oz (72.5 kg)  01/03/22 159 lb 12.8 oz (72.5 kg)     ASSESSMENT & PLAN:    Atypical chest pain Intermittent episodes of chest pain with chest wall tenderness, likely costochondritis (had a recent URTI) Recent ER visit chart reviewed, including blood tests and EKG Pain less likely to be cardiac in origin On Protonix for GERD Advised to take Tylenol as needed for chest wall pain Avoid NSAIDs due to cardiac history Started Medrol dosepak to reduce inflammation   Time:   Today, I have spent 9 minutes reviewing the chart, including problem list, medications, and with the patient with telehealth technology discussing the above problems.   Medication Adjustments/Labs and Tests Ordered: Current medicines are reviewed at length with the patient today.  Concerns regarding medicines are outlined above.   Tests Ordered: No orders of the defined types were placed in this encounter.   Medication Changes: No orders of the defined types were placed in this encounter.    Note: This dictation was prepared with Dragon dictation along with smaller phrase technology. Similar sounding words can be transcribed inadequately or may not be corrected upon review. Any transcriptional errors that result from this process are unintentional.      Disposition:  Follow up  Signed, Lindell Spar, MD  01/18/2022 3:16 PM     [place]

## 2022-01-19 ENCOUNTER — Encounter: Payer: Self-pay | Admitting: Hematology

## 2022-01-19 ENCOUNTER — Inpatient Hospital Stay (HOSPITAL_BASED_OUTPATIENT_CLINIC_OR_DEPARTMENT_OTHER): Payer: Medicare Other | Admitting: Hematology

## 2022-01-19 ENCOUNTER — Other Ambulatory Visit: Payer: Self-pay | Admitting: Family Medicine

## 2022-01-19 ENCOUNTER — Inpatient Hospital Stay: Payer: Medicare Other

## 2022-01-19 ENCOUNTER — Encounter (HOSPITAL_COMMUNITY): Payer: Self-pay | Admitting: Hematology

## 2022-01-19 VITALS — BP 148/85 | HR 92 | Temp 98.9°F | Resp 18 | Wt 166.7 lb

## 2022-01-19 DIAGNOSIS — C9000 Multiple myeloma not having achieved remission: Secondary | ICD-10-CM

## 2022-01-19 DIAGNOSIS — M62838 Other muscle spasm: Secondary | ICD-10-CM

## 2022-01-19 DIAGNOSIS — Z5112 Encounter for antineoplastic immunotherapy: Secondary | ICD-10-CM | POA: Diagnosis not present

## 2022-01-19 DIAGNOSIS — D509 Iron deficiency anemia, unspecified: Secondary | ICD-10-CM

## 2022-01-19 MED ORDER — BORTEZOMIB CHEMO SQ INJECTION 3.5 MG (2.5MG/ML)
1.0000 mg/m2 | Freq: Once | INTRAMUSCULAR | Status: AC
  Administered 2022-01-19: 1.75 mg via SUBCUTANEOUS
  Filled 2022-01-19: qty 0.7

## 2022-01-19 MED ORDER — DENOSUMAB 120 MG/1.7ML ~~LOC~~ SOLN
120.0000 mg | Freq: Once | SUBCUTANEOUS | Status: AC
  Administered 2022-01-19: 120 mg via SUBCUTANEOUS
  Filled 2022-01-19: qty 1.7

## 2022-01-19 MED ORDER — CYCLOBENZAPRINE HCL 10 MG PO TABS: ORAL_TABLET | ORAL | 0 refills | Status: DC

## 2022-01-19 MED ORDER — PROCHLORPERAZINE MALEATE 10 MG PO TABS
10.0000 mg | ORAL_TABLET | Freq: Four times a day (QID) | ORAL | Status: DC | PRN
  Administered 2022-01-19: 10 mg via ORAL
  Filled 2022-01-19: qty 1

## 2022-01-19 NOTE — Progress Notes (Signed)
Patient has been examined by Dr. Katragadda, and vital signs and labs have been reviewed. ANC, Creatinine, LFTs, hemoglobin, and platelets are within treatment parameters per M.D. - pt may proceed with treatment.  Primary RN and pharmacy notified.  

## 2022-01-19 NOTE — Progress Notes (Signed)
[place], [place] [address]   CLINIC:  Medical Oncology/Hematology  PCP:  Renee Rival, FNP [address] / [place] [address] [phone]   REASON FOR VISIT:  Follow-up for multiple myeloma  PRIOR THERAPY: Dara VRD 6 cycles completed on 11/30/2021  NGS Results: not done  CURRENT THERAPY: Maintenance Velcade every 2 weeks, and Revlimid 3 weeks on/1 week off  BRIEF ONCOLOGIC HISTORY:  Oncology History  IgA myeloma ([place])  05/06/2021 Initial Diagnosis   IgA myeloma ([place])   08/10/2021 -  Chemotherapy   Patient is on Treatment Plan : MYELOMA NEWLY DIAGNOSED TRANSPLANT CANDIDATE DaraVRd (Daratumumab SQ) q21d x 6 Cycles (Induction/Consolidation)       CANCER STAGING:  Cancer Staging  No matching staging information was found for the patient.  INTERVAL HISTORY:  [name], a 71 y.o. female, seen for follow-up of multiple myeloma.  She is tolerating Velcade every other week and Revlimid very well.  Numbness in the left foot more than right foot is stable.  Denies any fevers or infections.  No severe fatigue reported.  She reports sternal chest pain and was evaluated in the ER and thought to be noncardiac.  This chest pain is positional.   REVIEW OF SYSTEMS:  Review of Systems  Constitutional:  Negative for appetite change and fatigue.  HENT:   Positive for trouble swallowing.   Cardiovascular:  Positive for chest pain (Sternal).  Musculoskeletal:  Negative for arthralgias.  Neurological:  Positive for numbness (Left more than right foot stable). Negative for dizziness.  Psychiatric/Behavioral:  Negative for sleep disturbance.   All other systems reviewed and are negative.   PAST MEDICAL/SURGICAL HISTORY:  Past Medical History:  Diagnosis Date   Acute myocardial infarction [facility]) 2009   CAD/no stent medically managed   Anxiety disorder    CAD (coronary artery disease)    Cancer (HCC)     Hyperlipidemia    Hypertension    IBS (irritable bowel syndrome)    Non-ulcer dyspepsia 04/30/2009   Qualifier: Diagnosis of  By: Oneida Alar MD, Sandi L    Overactive bladder    PE (pulmonary embolism) 10/2012   Presence of permanent cardiac pacemaker    Sleep apnea    Past Surgical History:  Procedure Laterality Date   ABDOMINAL HYSTERECTOMY     BSO secondary to cyst     CARDIAC CATHETERIZATION     COLONOSCOPY  12/2009   Dr. West Carbo, propofol, normal. Next TCS 12/2019   COLONOSCOPY N/A 05/22/2017   Examined portion ileum was normal, significant looping of colon, external hemorrhoids and rectal bleeding due to internal hemorrhoids, mild diverticulosis procedure: COLONOSCOPY;  Surgeon: Danie Binder, MD;  [facility];  Service: Endoscopy;  Laterality: N/A;  10:30am   ESOPHAGOGASTRODUODENOSCOPY  05/11/2009   schatzki ring/small hiatal hernia/path:gastritis   ESOPHAGOGASTRODUODENOSCOPY N/A 05/22/2017   Multiple gastric polyps with biopsy benign fundic gland polyp, small bowel biopsy negative for celiac, gastric biopsy with mild gastritis but no H. pylori procedure: ESOPHAGOGASTRODUODENOSCOPY (EGD);  Surgeon: Danie Binder, MD;  [facility];  Service: Endoscopy;  Laterality: N/A;   ESOPHAGOGASTRODUODENOSCOPY (EGD) WITH ESOPHAGEAL DILATION N/A 04/01/2013   KWI:[id number] at the gastroesophageal juction/multiple small polyps/mild gastritis   GIVENS CAPSULE STUDY N/A 06/07/2017   Frequent gastric erosions, normal small bowel mucosa.  Procedure: GIVENS CAPSULE STUDY;  Surgeon: Danie Binder, MD;  [facility];  Service: Endoscopy;  Laterality:  N/A;  7:30am   INSERT / REPLACE / REMOVE PACEMAKER     Last year per pt.(can't remember date)   REPLACEMENT TOTAL KNEE Right 08/2020    SOCIAL HISTORY:  Social History   Socioeconomic History   Marital status: Married    Spouse name: Games developer   Number of children: 3   Years of education: 16   Highest education  level: Not on file  Occupational History   Not on file  Tobacco Use   Smoking status: Never   Smokeless tobacco: Never   Tobacco comments:    Never smoked   Vaping Use   Vaping Use: Never used  Substance and Sexual Activity   Alcohol use: Yes    Alcohol/week: 0.0 standard drinks of alcohol    Comment: occ wine   Drug use: No   Sexual activity: Not Currently  Other Topics Concern   Not on file  Social History Narrative      Lives with husband-51 years 58 in Aug 2021   Daughter is close by, 2 sons live further away   One IN [place],ONE IN [place], [place].        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   Enjoys: reading, young adult      Diet: eats all food groups    Caffeine: coffee 1, tea daily soda-1 daily   Water: 1-2 cups      Wears seat belt    Does not use phone while driving   Smoke detectors at home    Public house manager -locked up      Left handed   One story home   Drinks caffeine   Social Determinants of Health   Financial Resource Strain: Low Risk  (11/15/2021)   Overall Financial Resource Strain (CARDIA)    Difficulty of Paying Living Expenses: Not hard at all  Food Insecurity: No Food Insecurity (11/15/2021)   Hunger Vital Sign    Worried About Running Out of Food in the Last Year: Never true    Ran Out of Food in the Last Year: Never true  Transportation Needs: No Transportation Needs (11/15/2021)   PRAPARE - Hydrologist (Medical): No    Lack of Transportation (Non-Medical): No  Physical Activity: Inactive (11/26/2021)   Exercise Vital Sign    Days of Exercise per Week: 0 days    Minutes of Exercise per Session: 0 min  Stress: Stress Concern Present (11/26/2021)   [place]    Feeling of Stress : To some extent  Social Connections: Moderately Integrated (11/11/2020)   Social Connection and Isolation Panel [NHANES]    Frequency of  Communication with Friends and Family: Once a week    Frequency of Social Gatherings with Friends and Family: Once a week    Attends Religious Services: More than 4 times per year    Active Member of [employer] or Organizations: Yes    Attends Music therapist: More than 4 times per year    Marital Status: Married  Human resources officer Violence: Not At Risk (11/11/2020)   Humiliation, Afraid, Rape, and Kick questionnaire    Fear of Current or Ex-Partner: No    Emotionally Abused: No    Physically Abused: No    Sexually Abused: No    FAMILY HISTORY:  Family History  Problem Relation Age of Onset   Colon polyps Neg Hx    Colon cancer  Neg Hx     CURRENT MEDICATIONS:  Current Outpatient Medications  Medication Sig Dispense Refill   acetaminophen (TYLENOL) 500 MG tablet Take 1,000 mg by mouth 2 (two) times daily as needed for moderate pain or headache.     acyclovir (ZOVIRAX) 400 MG tablet Take 1 tablet (400 mg total) by mouth 2 (two) times daily. 60 tablet 6   albuterol (PROVENTIL HFA;VENTOLIN HFA) 108 (90 BASE) MCG/ACT inhaler Inhale 2 puffs into the lungs every 6 (six) hours as needed for shortness of breath.     Ascorbic Acid 500 MG CHEW Chew by mouth.     aspirin EC 81 MG tablet Take 81 mg by mouth at bedtime.     B Complex Vitamins (VITAMIN B COMPLEX) TABS Take 1 tablet by mouth daily.     bortezomib SQ (VELCADE) 3.5 MG injection Inject into the skin once.     busPIRone (BUSPAR) 5 MG tablet Take 1 tablet (5 mg total) by mouth daily. 30 tablet 3   Calcium Carbonate-Vitamin D (CALCIUM 600+D PO) Take 1 tablet by mouth 2 (two) times daily.     chlorthalidone (HYGROTON) 25 MG tablet Take 25 mg by mouth daily.      cholecalciferol (VITAMIN D) 25 MCG (1000 UNIT) tablet Take 1,000 Units by mouth daily.     cyanocobalamin 1000 MCG tablet Take 1 tablet by mouth daily.     cyclobenzaprine (FLEXERIL) 10 MG tablet TAKE 1/2 TABLET BY MOUTH TWICE DAILY AS NEEDED FOR MUSCLE SPASMS. 20 tablet  0   Daratumumab-Hyaluronidase-fihj (DARZALEX FASPRO [place]) Inject into the skin.     diclofenac Sodium (VOLTAREN) 1 % GEL Apply 4 g topically 4 (four) times daily. 50 g 0   fluticasone (FLONASE) 50 MCG/ACT nasal spray USE 2 SPRAYS IN EACH NOSTRIL ONCE DAILY. 16 g 0   folic acid (FOLVITE) 1 MG tablet TAKE 1 TABLET BY MOUTH ONCE DAILY. (Patient taking differently: Take 1 mg by mouth daily.) 30 tablet 11   gabapentin (NEURONTIN) 600 MG tablet Take 600 mg by mouth 2 (two) times daily.     hydrochlorothiazide (MICROZIDE) 12.5 MG capsule Take 12.5 mg by mouth daily.     hydrOXYzine (VISTARIL) 25 MG capsule      Hyoscyamine Sulfate SL 0.125 MG SUBL      lenalidomide (REVLIMID) 15 MG capsule Take 1 capsule (15 mg total) by mouth daily. 21 days on, 7 days off every 28 days 21 capsule 0   lidocaine (XYLOCAINE) 2 % solution TAKE 2 TEASPOONFULS BEFORE MEALS AND AT BEDTIME AS NEEDED MAY REPEAT EVERY 4 HOURS. (MAX OF 8 DOSES PER DAY) 300 mL 0   Lidocaine HCl (XOLIDO) 2 % CREA Apply 1 application topically daily as needed (arthritisis).     loperamide (IMODIUM) 2 MG capsule      losartan (COZAAR) 100 MG tablet Take 100 mg by mouth daily.     magnesium oxide (MAG-OX) 400 (240 Mg) MG tablet Take 1 tablet (400 mg total) by mouth 2 (two) times daily. 60 tablet 4   methylPREDNISolone (MEDROL DOSEPAK) 4 MG TBPK tablet Take as package instructions. 1 each 0   metoprolol tartrate (LOPRESSOR) 50 MG tablet Take 1 tablet (50 mg total) by mouth 2 (two) times daily. 180 tablet 1   montelukast (SINGULAIR) 10 MG tablet Take 10 mg by mouth daily.      MYRBETRIQ 25 MG TB24 tablet TAKE 1 TABLET BY MOUTH ONCE DAILY. 30 tablet 0   nitroGLYCERIN (NITROSTAT) 0.4 MG SL tablet  Place 0.4 mg under the tongue every 5 (five) minutes as needed for chest pain.     ondansetron (ZOFRAN-ODT) 4 MG disintegrating tablet      pantoprazole (PROTONIX) 40 MG tablet Take 40 mg by mouth daily.     potassium chloride SA (KLOR-CON M) 20 MEQ tablet Take 1  tablet (20 mEq total) by mouth 3 (three) times daily. 21 tablet 0   primidone (MYSOLINE) 50 MG tablet Take 50 mg by mouth daily.     Probiotic Product (GNP PROBIOTIC COLON SUPPORT) CAPS Take 1 capsule by mouth daily.     sertraline (ZOLOFT) 100 MG tablet Take 200 mg by mouth daily.      simvastatin (ZOCOR) 40 MG tablet Take 40 mg by mouth at bedtime.     sucralfate (CARAFATE) 1 GM/10ML suspension      warfarin (COUMADIN) 3 MG tablet Take 3 mg by mouth daily.     No current facility-administered medications for this visit.    ALLERGIES:  Allergies  Allergen Reactions   Amoxicillin-Pot Clavulanate Other (See Comments)   Biaxin [Clarithromycin] Other (See Comments)    Stomach problems    Erythromycin    Lisinopril Swelling    PHYSICAL EXAM:  Performance status (ECOG): 1 - Symptomatic but completely ambulatory  There were no vitals filed for this visit. Wt Readings from Last 3 Encounters:  01/13/22 162 lb 6.4 oz (73.7 kg)  01/07/22 159 lb 13.3 oz (72.5 kg)  01/03/22 159 lb 12.8 oz (72.5 kg)   Physical Exam Vitals reviewed.  Constitutional:      Appearance: Normal appearance.  Cardiovascular:     Rate and Rhythm: Normal rate and regular rhythm.     Pulses: Normal pulses.     Heart sounds: Normal heart sounds.  Pulmonary:     Effort: Pulmonary effort is normal.     Breath sounds: Normal breath sounds.  Neurological:     General: No focal deficit present.     Mental Status: She is alert and oriented to person, place, and time.  Psychiatric:        Mood and Affect: Mood normal.        Behavior: Behavior normal.     LABORATORY DATA:  I have reviewed the labs as listed.     Latest Ref Rng & Units 01/12/2022   11:26 AM 01/07/2022    1:28 PM 01/03/2022   12:11 PM  CBC  WBC 4.0 - 10.5 K/uL 3.8  3.1  3.1   Hemoglobin 12.0 - 15.0 g/dL 11.4  11.3  11.1   Hematocrit 36.0 - 46.0 % 35.2  35.0  34.5   Platelets 150 - 400 K/uL 85  83  112       Latest Ref Rng & Units 01/12/2022    11:26 AM 01/07/2022    1:28 PM 01/03/2022   12:11 PM  CMP  Glucose 70 - 99 mg/dL 86  91  99   BUN 8 - 23 mg/dL _0 Creatinine 0.44 - 1.00 mg/dL 0.84  0.83  0.89   Sodium 135 - 145 mmol/L 137  141  144   Potassium 3.5 - 5.1 mmol/L 3.7  3.8  4.1   Chloride 98 - 111 mmol/L 104  105  109   CO2 22 - 32 mmol/L _1 Calcium 8.9 - 10.3 mg/dL 8.0  8.2  9.0   Total Protein 6.5 - 8.1 g/dL 6.5   6.7  Total Bilirubin 0.3 - 1.2 mg/dL 1.0   0.2   Alkaline Phos 38 - 126 U/L 43   59   AST 15 - 41 U/L 18   19   ALT 0 - 44 U/L 17   18     DIAGNOSTIC IMAGING:  I have independently reviewed the scans and discussed with the patient. DG Chest 2 View  Result Date: 01/07/2022 CLINICAL DATA:  Chest pain EXAM: CHEST - 2 VIEW COMPARISON:  09/26/2021 and multiple prior radiographs. CT 09/20/2021 FINDINGS: Unchanged car mediastinal silhouette dual chamber pacemaker leads. Unchanged right lung and left basilar scarring. There is no new focal airspace disease. No pleural effusion. No pneumothorax. No acute osseous abnormality. Thoracic spondylosis. IMPRESSION: Stable lung scarring.  No new focal airspace disease. Electronically Signed   By: Maurine Simmering M.D.   On: 01/07/2022 13:20     ASSESSMENT:  1.  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  Increased number of atypical plasma cells present 36% of all cell lines.  Plasma cells are kappa light chain restricted. -Unfortunately her specimen has reached cytogenetics and FISH lab week later due to bad weather and no viable plasma cells. -PET scan on 07/09/2019 showed no findings of active myeloma. -24-hour urine shows total protein 59 mg.  Urine immunofixation shows IgA kappa monoclonal protein.  LDH is 184. -Based on [facility] criteria she has high risk with about 45-50% probability of progression to myeloma in the next 2 years. -CTAP on 01/07/2020 shows acute  superior endplate burst fracture of L1.  Mild associated paravertebral soft tissue thickening.  No other bone abnormalities. -Bone density test on 02/07/2020 shows T score -0.5. - Bone marrow biopsy on 07/12/2021: Hypercellular with increased number of atypical plasma cells representing 30% of cells, displaying kappa light chain restriction. - FISH panel: Gain of 1 q. (high risk), T p53 deletion (high risk), monosomy 13 (standard risk) - Cytogenetics: Complex karyotype. - PET scan (06/24/2021): New hypermetabolic bone lesions involving left anterior iliac crest, left mid humerus, right superior pubic ramus, bilateral mid femurs, left calvarium.  Largest lytic lesion in the left iliac crest measuring 2.9 x 2.3 cm. - 2D echo on 07/29/2021: EF 50-55%.  There is distal septal and inferior apical hypokinesis.  (Carfilzomib based regimen was put on hold due to echo findings) - Dara VRD 6 cycles from 08/10/2021 through 11/30/2021 - Myeloma panel 12/07/2021, DIRA test was negative, M spike 0.2 g and light chain ratio normal. - Maintenance Velcade every 2 weeks and Revlimid 3 weeks on 1 week off Started on 12/20/2021  2.  Social/family history: - She uses walker to ambulate since her right knee replacement leading to stiffness.  She lives with older sister and husband who has dementia. - She is independent of ADLs and IADLs.  She also drives.   PLAN:  1.  Stage II IgA kappa multiple myeloma, high risk with del 17p: - She is tolerating maintenance Velcade every other week very well.  Reviewed myeloma labs from 01/12/2022 which showed M spike is stable at 0.2 g.  Free light chain ratio has slightly increased 1.84 although kappa light chains are normal at 13.6.  Immunofixation shows IgG kappa. - Her myeloma is IgA kappa.  Hence have recommended continuing maintenance Velcade every other week and Revlimid 3 weeks on 1 week off.  She is currently taking Revlimid 15 mg.  She has already ordered next bottle  which will start  on 01/26/2022 at 15 mg dose.  After the next cycle, I have recommended cutting back Revlimid to 10 mg 3 weeks on 1 week off.  She is no longer taking dexamethasone. - RTC 8 weeks for follow-up with repeat myeloma labs.   2.  Electrolyte abnormalities: - Continue magnesium twice daily and potassium 20 mEq daily.   3.  Pulmonary embolism (2015): - Continue Coumadin indefinitely.  No bleeding issues.  4.  Hypotension: - Continue metoprolol 50 mg half tablet twice daily and Cozaar 100 mg daily.  Continue to hold Norvasc.  5.  ID prophylaxis: - Continue acyclovir 400 mg twice daily.  6.  Myeloma bone disease: - Continue calcium 2 tablets twice daily.  Continue denosumab monthly.  7.  Peripheral neuropathy: - This is stable with left foot numbness more than right foot numbness.  No burning pains reported.   Orders placed this encounter:  No orders of the defined types were placed in this encounter.    Derek Jack, MD [place] [phone]

## 2022-01-19 NOTE — Patient Instructions (Signed)
[place] at [facility] Discharge Instructions   You were seen and examined today by Dr. Delton Coombes.  He reviewed the results of your lab work which are normal/stable.   We will plan to cut down on the dose of your Revlimid to 10 mg 3 weeks on and 1 week off.   We will proceed with your injection today.   Return as scheduled.    Thank you for choosing [place] at [facility] to provide your oncology and hematology care.  To afford each patient quality time with our provider, please arrive at least 15 minutes before your scheduled appointment time.   If you have a lab appointment with the [facility] please come in thru the Main Entrance and check in at the main information desk.  You need to re-schedule your appointment should you arrive 10 or more minutes late.  We strive to give you quality time with our providers, and arriving late affects you and other patients whose appointments are after yours.  Also, if you no show three or more times for appointments you may be dismissed from the clinic at the providers discretion.     Again, thank you for choosing [facility].  Our hope is that these requests will decrease the amount of time that you wait before being seen by our physicians.       _____________________________________________________________  Should you have questions after your visit to [facility], please contact our office at [phone] and follow the prompts.  Our office hours are 8:00 a.m. and 4:30 p.m. Monday - Friday.  Please note that voicemails left after 4:00 p.m. may not be returned until the following business day.  We are closed weekends and major holidays.  You do have access to a nurse 24-7, just call the main number to the clinic [phone] and do not press any options, hold on the line and a nurse will answer the phone.    For prescription refill requests, have your pharmacy contact  our office and allow 72 hours.    Due to Covid, you will need to wear a mask upon entering the hospital. If you do not have a mask, a mask will be given to you at the Main Entrance upon arrival. For doctor visits, patients may have 1 support person age 16 or older with them. For treatment visits, patients can not have anyone with them due to social distancing guidelines and our immunocompromised population.

## 2022-01-19 NOTE — Progress Notes (Signed)
Message received from A. Beckie Salts / Dr. Delton Coombes to proceed with treatment. Labs from 01/12/2022. Platelets 85. Labs reviewed by Dr. Delton Coombes. Vital signs within parameters for treatment. Calcium 8.0 and MD aware. Verbal orders to give Xgeva today with tx. Patient taking Cholecalciferol(Vitamin D) 25 mcg (1000 unit) tablet daily. Patient denies jaw pain or dental work. Patient taking Revlimid as prescribed with no side effects concerns expressed.  Velcade given today per MD orders. Tolerated without adverse affects. Vital signs stable. No complaints at this time. Discharged from clinic ambulatory in stable condition. Alert and oriented x 3. F/U with [facility] as scheduled.

## 2022-01-19 NOTE — Patient Instructions (Signed)
[facility]  Discharge Instructions: Thank you for choosing [facility] to provide your oncology and hematology care.  If you have a lab appointment with the [facility], please come in thru the Main Entrance and check in at the main information desk.  Wear comfortable clothing and clothing appropriate for easy access to any Portacath or PICC line.   We strive to give you quality time with your provider. You may need to reschedule your appointment if you arrive late (15 or more minutes).  Arriving late affects you and other patients whose appointments are after yours.  Also, if you miss three or more appointments without notifying the office, you may be dismissed from the clinic at the provider's discretion.      For prescription refill requests, have your pharmacy contact our office and allow 72 hours for refills to be completed.    Today you received the following chemotherapy and/or immunotherapy agents Velcade. Bortezomib Injection What is this medication? BORTEZOMIB (bor TEZ oh mib) treats lymphoma. It may also be used to treat multiple myeloma, a type of bone marrow cancer. It works by blocking a protein that causes cancer cells to grow and multiply. This helps to slow or stop the spread of cancer cells. This medicine may be used for other purposes; ask your health care provider or pharmacist if you have questions. COMMON BRAND NAME(S): Velcade What should I tell my care team before I take this medication? They need to know if you have any of these conditions: Dehydration Diabetes Heart disease Liver disease Tingling of the fingers or toes or other nerve disorder An unusual or allergic reaction to bortezomib, other medications, foods, dyes, or preservatives If you or your partner are pregnant or trying to get pregnant Breastfeeding How should I use this medication? This medication is injected into a vein or under the skin. It is given by your  care team in a hospital or clinic setting. Talk to your care team about the use of this medication in children. Special care may be needed. Overdosage: If you think you have taken too much of this medicine contact a poison control center or emergency room at once. NOTE: This medicine is only for you. Do not share this medicine with others. What if I miss a dose? Keep appointments for follow-up doses. It is important not to miss your dose. Call your care team if you are unable to keep an appointment. What may interact with this medication? Ketoconazole Rifampin This list may not describe all possible interactions. Give your health care provider a list of all the medicines, herbs, non-prescription drugs, or dietary supplements you use. Also tell them if you smoke, drink alcohol, or use illegal drugs. Some items may interact with your medicine. What should I watch for while using this medication? Your condition will be monitored carefully while you are receiving this medication. You may need blood work while taking this medication. This medication may affect your coordination, reaction time, or judgment. Do not drive or operate machinery until you know how this medication affects you. Sit up or stand slowly to reduce the risk of dizzy or fainting spells. Drinking alcohol with this medication can increase the risk of these side effects. This medication may increase your risk of getting an infection. Call your care team for advice if you get a fever, chills, sore throat, or other symptoms of a cold or flu. Do not treat yourself. Try to avoid being around people  who are sick. Check with your care team if you have severe diarrhea, nausea, and vomiting, or if you sweat a lot. The loss of too much body fluid may make it dangerous for you to take this medication. Talk to your care team if you may be pregnant. Serious birth defects can occur if you take this medication during pregnancy and for 7 months after the  last dose. You will need a negative pregnancy test before starting this medication. Contraception is recommended while taking this medication and for 7 months after the last dose. Your care team can help you find the option that works for you. If your partner can get pregnant, use a condom during sex while taking this medication and for 4 months after the last dose. Do not breastfeed while taking this medication and for 2 months after the last dose. This medication may cause infertility. Talk to your care team if you are concerned about your fertility. What side effects may I notice from receiving this medication? Side effects that you should report to your care team as soon as possible: Allergic reactions--skin rash, itching, hives, swelling of the face, lips, tongue, or throat Bleeding--bloody or black, tar-like stools, vomiting blood or brown material that looks like coffee grounds, red or dark brown urine, small red or purple spots on skin, unusual bruising or bleeding Bleeding in the brain--severe headache, stiff neck, confusion, dizziness, change in vision, numbness or weakness of the face, arm, or leg, trouble speaking, trouble walking, vomiting Bowel blockage--stomach cramping, unable to have a bowel movement or pass gas, loss of appetite, vomiting Heart failure--shortness of breath, swelling of the ankles, feet, or hands, sudden weight gain, unusual weakness or fatigue Infection--fever, chills, cough, sore throat, wounds that don't heal, pain or trouble when passing urine, general feeling of discomfort or being unwell Liver injury--right upper belly pain, loss of appetite, nausea, light-colored stool, dark yellow or brown urine, yellowing skin or eyes, unusual weakness or fatigue Low blood pressure--dizziness, feeling faint or lightheaded, blurry vision Lung injury--shortness of breath or trouble breathing, cough, spitting up blood, chest pain, fever Pain, tingling, or numbness in the hands  or feet Severe or prolonged diarrhea Stomach pain, bloody diarrhea, pale skin, unusual weakness or fatigue, decrease in the amount of urine, which may be signs of hemolytic uremic syndrome Sudden and severe headache, confusion, change in vision, seizures, which may be signs of posterior reversible encephalopathy syndrome (PRES) TTP--purple spots on the skin or inside the mouth, pale skin, yellowing skin or eyes, unusual weakness or fatigue, fever, fast or irregular heartbeat, confusion, change in vision, trouble speaking, trouble walking Tumor lysis syndrome (TLS)--nausea, vomiting, diarrhea, decrease in the amount of urine, dark urine, unusual weakness or fatigue, confusion, muscle pain or cramps, fast or irregular heartbeat, joint pain Side effects that usually do not require medical attention (report to your care team if they continue or are bothersome): Constipation Diarrhea Fatigue Loss of appetite Nausea This list may not describe all possible side effects. Call your doctor for medical advice about side effects. You may report side effects to FDA at 1-800-FDA-1088. Where should I keep my medication? This medication is given in a hospital or clinic. It will not be stored at home. NOTE: This sheet is a summary. It may not cover all possible information. If you have questions about this medicine, talk to your doctor, pharmacist, or health care provider.  2023 Elsevier/Gold Standard (2021-10-06 00:00:00)       To help prevent nausea  and vomiting after your treatment, we encourage you to take your nausea medication as directed.  BELOW ARE SYMPTOMS THAT SHOULD BE REPORTED IMMEDIATELY: *FEVER GREATER THAN 100.4 F (38 C) OR HIGHER *CHILLS OR SWEATING *NAUSEA AND VOMITING THAT IS NOT CONTROLLED WITH YOUR NAUSEA MEDICATION *UNUSUAL SHORTNESS OF BREATH *UNUSUAL BRUISING OR BLEEDING *URINARY PROBLEMS (pain or burning when urinating, or frequent urination) *BOWEL PROBLEMS (unusual diarrhea,  constipation, pain near the anus) TENDERNESS IN MOUTH AND THROAT WITH OR WITHOUT PRESENCE OF ULCERS (sore throat, sores in mouth, or a toothache) UNUSUAL RASH, SWELLING OR PAIN  UNUSUAL VAGINAL DISCHARGE OR ITCHING   Items with * indicate a potential emergency and should be followed up as soon as possible or go to the Emergency Department if any problems should occur.  Please show the CHEMOTHERAPY ALERT CARD or IMMUNOTHERAPY ALERT CARD at check-in to the Emergency Department and triage nurse.  Should you have questions after your visit or need to cancel or reschedule your appointment, please contact [facility] [phone]  and follow the prompts.  Office hours are 8:00 a.m. to 4:30 p.m. Monday - Friday. Please note that voicemails left after 4:00 p.m. may not be returned until the following business day.  We are closed weekends and major holidays. You have access to a nurse at all times for urgent questions. Please call the main number to the clinic [phone] and follow the prompts.  For any non-urgent questions, you may also contact your provider using MyChart. We now offer e-Visits for anyone 47 and older to request care online for non-urgent symptoms. For details visit [email or web address].   Also download the MyChart app! Go to the app store, search "MyChart", open the app, select [facility], and log in with your MyChart username and password.  Masks are optional in the cancer centers. If you would like for your care team to wear a mask while they are taking care of you, please let them know. You may have one support person who is at least 73 years old accompany you for your appointments.

## 2022-01-22 ENCOUNTER — Encounter: Payer: Self-pay | Admitting: Hematology

## 2022-01-22 NOTE — Addendum Note (Signed)
Addended by: Derek Jack on: 01/22/2022 04:52 PM   Modules accepted: Orders

## 2022-02-02 ENCOUNTER — Ambulatory Visit (INDEPENDENT_AMBULATORY_CARE_PROVIDER_SITE_OTHER): Payer: Medicare Other | Admitting: Gastroenterology

## 2022-02-02 ENCOUNTER — Encounter: Payer: Self-pay | Admitting: Gastroenterology

## 2022-02-02 ENCOUNTER — Inpatient Hospital Stay: Payer: Medicare Other | Attending: Hematology

## 2022-02-02 ENCOUNTER — Inpatient Hospital Stay: Payer: Medicare Other

## 2022-02-02 VITALS — BP 142/72 | HR 88 | Temp 97.4°F | Resp 20

## 2022-02-02 VITALS — BP 129/75 | HR 85 | Temp 97.7°F | Ht 63.0 in | Wt 165.6 lb

## 2022-02-02 DIAGNOSIS — Z79899 Other long term (current) drug therapy: Secondary | ICD-10-CM | POA: Diagnosis not present

## 2022-02-02 DIAGNOSIS — C9000 Multiple myeloma not having achieved remission: Secondary | ICD-10-CM

## 2022-02-02 DIAGNOSIS — Z5112 Encounter for antineoplastic immunotherapy: Secondary | ICD-10-CM | POA: Insufficient documentation

## 2022-02-02 DIAGNOSIS — K581 Irritable bowel syndrome with constipation: Secondary | ICD-10-CM

## 2022-02-02 DIAGNOSIS — K219 Gastro-esophageal reflux disease without esophagitis: Secondary | ICD-10-CM | POA: Diagnosis not present

## 2022-02-02 LAB — COMPREHENSIVE METABOLIC PANEL
ALT: 27 U/L (ref 0–44)
AST: 21 U/L (ref 15–41)
Albumin: 4.1 g/dL (ref 3.5–5.0)
Alkaline Phosphatase: 60 U/L (ref 38–126)
Anion gap: 6 (ref 5–15)
BUN: 15 mg/dL (ref 8–23)
CO2: 29 mmol/L (ref 22–32)
Calcium: 9.3 mg/dL (ref 8.9–10.3)
Chloride: 103 mmol/L (ref 98–111)
Creatinine, Ser: 0.72 mg/dL (ref 0.44–1.00)
GFR, Estimated: >60 mL/min (ref >60)
Glucose, Bld: 93 mg/dL (ref 70–99)
Potassium: 3.7 mmol/L (ref 3.5–5.1)
Sodium: 138 mmol/L (ref 135–145)
Total Bilirubin: 0.9 mg/dL (ref 0.3–1.2)
Total Protein: 6.7 g/dL (ref 6.5–8.1)

## 2022-02-02 LAB — CBC WITH DIFFERENTIAL/PLATELET
Abs Immature Granulocytes: 0 10*3/uL (ref 0.00–0.07)
Basophils Absolute: 0.1 10*3/uL (ref 0.0–0.1)
Basophils Relative: 2 %
Eosinophils Absolute: 0.3 10*3/uL (ref 0.0–0.5)
Eosinophils Relative: 8 %
HCT: 37.1 % (ref 36.0–46.0)
Hemoglobin: 12.1 g/dL (ref 12.0–15.0)
Immature Granulocytes: 0 %
Lymphocytes Relative: 22 %
Lymphs Abs: 0.8 10*3/uL (ref 0.7–4.0)
MCH: 33.2 pg (ref 26.0–34.0)
MCHC: 32.6 g/dL (ref 30.0–36.0)
MCV: 101.6 fL — ABNORMAL HIGH (ref 80.0–100.0)
Monocytes Absolute: 0.2 10*3/uL (ref 0.1–1.0)
Monocytes Relative: 6 %
Neutro Abs: 2.3 10*3/uL (ref 1.7–7.7)
Neutrophils Relative %: 62 %
Platelets: 103 10*3/uL — ABNORMAL LOW (ref 150–400)
RBC: 3.65 MIL/uL — ABNORMAL LOW (ref 3.87–5.11)
RDW: 17.3 % — ABNORMAL HIGH (ref 11.5–15.5)
WBC: 3.6 10*3/uL — ABNORMAL LOW (ref 4.0–10.5)
nRBC: 0 % (ref 0.0–0.2)

## 2022-02-02 LAB — MAGNESIUM: Magnesium: 1.8 mg/dL (ref 1.7–2.4)

## 2022-02-02 MED ORDER — BORTEZOMIB CHEMO SQ INJECTION 3.5 MG (2.5MG/ML)
1.0000 mg/m2 | Freq: Once | INTRAMUSCULAR | Status: AC
  Administered 2022-02-02: 1.75 mg via SUBCUTANEOUS
  Filled 2022-02-02: qty 0.7

## 2022-02-02 MED ORDER — PROCHLORPERAZINE MALEATE 10 MG PO TABS
10.0000 mg | ORAL_TABLET | Freq: Once | ORAL | Status: AC
  Administered 2022-02-02: 10 mg via ORAL
  Filled 2022-02-02: qty 1

## 2022-02-02 NOTE — Progress Notes (Signed)
Patient tolerated Velcade injection with no complaints voiced. Lab work reviewed. See MAR for details. Injection site clean and dry with no bruising or swelling noted. Patient stable during and after injection. Band aid applied. VSS. Patient left in satisfactory condition with no s/s of distress noted. 

## 2022-02-02 NOTE — Patient Instructions (Addendum)
Continue pantoprazole daily for reflux. If you are having frequent issues with hard stool, you can add miralax 1/2 to 1 capful daily as needed to soften stool.  Return to the office in one year or sooner if needed.

## 2022-02-02 NOTE — Patient Instructions (Signed)
[facility]  Discharge Instructions: Thank you for choosing [facility] to provide your oncology and hematology care.  If you have a lab appointment with the [facility], please come in thru the Main Entrance and check in at the main information desk.  Wear comfortable clothing and clothing appropriate for easy access to any Portacath or PICC line.   We strive to give you quality time with your provider. You may need to reschedule your appointment if you arrive late (15 or more minutes).  Arriving late affects you and other patients whose appointments are after yours.  Also, if you miss three or more appointments without notifying the office, you may be dismissed from the clinic at the provider's discretion.      For prescription refill requests, have your pharmacy contact our office and allow 72 hours for refills to be completed.    Today you received the following chemotherapy and/or immunotherapy agents velcade.  Bortezomib Injection What is this medication? BORTEZOMIB (bor TEZ oh mib) treats lymphoma. It may also be used to treat multiple myeloma, a type of bone marrow cancer. It works by blocking a protein that causes cancer cells to grow and multiply. This helps to slow or stop the spread of cancer cells. This medicine may be used for other purposes; ask your health care provider or pharmacist if you have questions. COMMON BRAND NAME(S): Velcade What should I tell my care team before I take this medication? They need to know if you have any of these conditions: Dehydration Diabetes Heart disease Liver disease Tingling of the fingers or toes or other nerve disorder An unusual or allergic reaction to bortezomib, other medications, foods, dyes, or preservatives If you or your partner are pregnant or trying to get pregnant Breastfeeding How should I use this medication? This medication is injected into a vein or under the skin. It is given by your  care team in a hospital or clinic setting. Talk to your care team about the use of this medication in children. Special care may be needed. Overdosage: If you think you have taken too much of this medicine contact a poison control center or emergency room at once. NOTE: This medicine is only for you. Do not share this medicine with others. What if I miss a dose? Keep appointments for follow-up doses. It is important not to miss your dose. Call your care team if you are unable to keep an appointment. What may interact with this medication? Ketoconazole Rifampin This list may not describe all possible interactions. Give your health care provider a list of all the medicines, herbs, non-prescription drugs, or dietary supplements you use. Also tell them if you smoke, drink alcohol, or use illegal drugs. Some items may interact with your medicine. What should I watch for while using this medication? Your condition will be monitored carefully while you are receiving this medication. You may need blood work while taking this medication. This medication may affect your coordination, reaction time, or judgment. Do not drive or operate machinery until you know how this medication affects you. Sit up or stand slowly to reduce the risk of dizzy or fainting spells. Drinking alcohol with this medication can increase the risk of these side effects. This medication may increase your risk of getting an infection. Call your care team for advice if you get a fever, chills, sore throat, or other symptoms of a cold or flu. Do not treat yourself. Try to avoid being around  people who are sick. Check with your care team if you have severe diarrhea, nausea, and vomiting, or if you sweat a lot. The loss of too much body fluid may make it dangerous for you to take this medication. Talk to your care team if you may be pregnant. Serious birth defects can occur if you take this medication during pregnancy and for 7 months after the  last dose. You will need a negative pregnancy test before starting this medication. Contraception is recommended while taking this medication and for 7 months after the last dose. Your care team can help you find the option that works for you. If your partner can get pregnant, use a condom during sex while taking this medication and for 4 months after the last dose. Do not breastfeed while taking this medication and for 2 months after the last dose. This medication may cause infertility. Talk to your care team if you are concerned about your fertility. What side effects may I notice from receiving this medication? Side effects that you should report to your care team as soon as possible: Allergic reactions--skin rash, itching, hives, swelling of the face, lips, tongue, or throat Bleeding--bloody or black, tar-like stools, vomiting blood or brown material that looks like coffee grounds, red or dark brown urine, small red or purple spots on skin, unusual bruising or bleeding Bleeding in the brain--severe headache, stiff neck, confusion, dizziness, change in vision, numbness or weakness of the face, arm, or leg, trouble speaking, trouble walking, vomiting Bowel blockage--stomach cramping, unable to have a bowel movement or pass gas, loss of appetite, vomiting Heart failure--shortness of breath, swelling of the ankles, feet, or hands, sudden weight gain, unusual weakness or fatigue Infection--fever, chills, cough, sore throat, wounds that don't heal, pain or trouble when passing urine, general feeling of discomfort or being unwell Liver injury--right upper belly pain, loss of appetite, nausea, light-colored stool, dark yellow or brown urine, yellowing skin or eyes, unusual weakness or fatigue Low blood pressure--dizziness, feeling faint or lightheaded, blurry vision Lung injury--shortness of breath or trouble breathing, cough, spitting up blood, chest pain, fever Pain, tingling, or numbness in the hands  or feet Severe or prolonged diarrhea Stomach pain, bloody diarrhea, pale skin, unusual weakness or fatigue, decrease in the amount of urine, which may be signs of hemolytic uremic syndrome Sudden and severe headache, confusion, change in vision, seizures, which may be signs of posterior reversible encephalopathy syndrome (PRES) TTP--purple spots on the skin or inside the mouth, pale skin, yellowing skin or eyes, unusual weakness or fatigue, fever, fast or irregular heartbeat, confusion, change in vision, trouble speaking, trouble walking Tumor lysis syndrome (TLS)--nausea, vomiting, diarrhea, decrease in the amount of urine, dark urine, unusual weakness or fatigue, confusion, muscle pain or cramps, fast or irregular heartbeat, joint pain Side effects that usually do not require medical attention (report to your care team if they continue or are bothersome): Constipation Diarrhea Fatigue Loss of appetite Nausea This list may not describe all possible side effects. Call your doctor for medical advice about side effects. You may report side effects to FDA at 1-800-FDA-1088. Where should I keep my medication? This medication is given in a hospital or clinic. It will not be stored at home. NOTE: This sheet is a summary. It may not cover all possible information. If you have questions about this medicine, talk to your doctor, pharmacist, or health care provider.  2023 Elsevier/Gold Standard (2021-10-06 00:00:00)        To help   prevent nausea and vomiting after your treatment, we encourage you to take your nausea medication as directed.  BELOW ARE SYMPTOMS THAT SHOULD BE REPORTED IMMEDIATELY: *FEVER GREATER THAN 100.4 F (38 C) OR HIGHER *CHILLS OR SWEATING *NAUSEA AND VOMITING THAT IS NOT CONTROLLED WITH YOUR NAUSEA MEDICATION *UNUSUAL SHORTNESS OF BREATH *UNUSUAL BRUISING OR BLEEDING *URINARY PROBLEMS (pain or burning when urinating, or frequent urination) *BOWEL PROBLEMS (unusual diarrhea,  constipation, pain near the anus) TENDERNESS IN MOUTH AND THROAT WITH OR WITHOUT PRESENCE OF ULCERS (sore throat, sores in mouth, or a toothache) UNUSUAL RASH, SWELLING OR PAIN  UNUSUAL VAGINAL DISCHARGE OR ITCHING   Items with * indicate a potential emergency and should be followed up as soon as possible or go to the Emergency Department if any problems should occur.  Please show the CHEMOTHERAPY ALERT CARD or IMMUNOTHERAPY ALERT CARD at check-in to the Emergency Department and triage nurse.  Should you have questions after your visit or need to cancel or reschedule your appointment, please contact [facility] [phone]  and follow the prompts.  Office hours are 8:00 a.m. to 4:30 p.m. Monday - Friday. Please note that voicemails left after 4:00 p.m. may not be returned until the following business day.  We are closed weekends and major holidays. You have access to a nurse at all times for urgent questions. Please call the main number to the clinic [phone] and follow the prompts.  For any non-urgent questions, you may also contact your provider using MyChart. We now offer e-Visits for anyone 18 and older to request care online for non-urgent symptoms. For details visit [email or web address].   Also download the MyChart app! Go to the app store, search "MyChart", open the app, select [facility], and log in with your MyChart username and password.  Masks are optional in the cancer centers. If you would like for your care team to wear a mask while they are taking care of you, please let them know. You may have one support person who is at least 73 years old accompany you for your appointments.  

## 2022-02-02 NOTE — Progress Notes (Signed)
Dose adjusted to 1 mg/m2 as on previous plans but not modified with new IS treatment plan.  Henreitta Leber, PharmD

## 2022-02-02 NOTE — Progress Notes (Signed)
GI Office Note    Referring Provider: Renee Rival, FNP Primary Care Physician:  Renee Rival, FNP  Primary Gastroenterologist: Elon Alas. Abbey Chatters, DO   Chief Complaint   Chief Complaint  Patient presents with   Follow-up    Doing well, had an issue with constipation but is better since taking mylanta.    History of Present Illness   Courtney Rogers is a 71 y.o. female presenting today for follow up of IBS, GERD. Last seen in 07/2021.    Continues to follow with Dr. Delton Coombes for multiple myeloma.  Patient is on Velcade every other week and Revlimid on 3 weeks off 1 week.  From a GI standpoint she is doing well.  Reflux is well controlled.  Denies abdominal pain.  Occasional milk of magnesia needed for constipation.  Rarely uses Imodium or lidocaine for diarrhea and abdominal pain.  No melena or rectal bleeding.  Appetite good.  Weight has been stable.  Recently has had some pain in the chest wall/sternum region.  Saw PCP started on Medrol Dosepak.  She has noted some improvement.  Chest x-ray last month with stable lung scarring.   EGD December 2018: -Multiple gastric polyps. Resected and retrieved.  Biopsy: Benign fundic gland polyp. -NO OBVIOUS SOURCE FOR NORMOCYTIC ANEMIA IDENTIFIED. -Small bowel biopsy negative for celiac, gastric biopsy with mild gastritis, no H. pylori.   Colonoscopy December 2018: -The examined portion of the ileum was normal. - There was significant looping of the colon. - External HEMORRHOIDS and RECTAL BLEEDING DUE TO internal hemorrhoids. - MILD Diverticulosis in the recto-sigmoid colon and in the sigmoid colon. -No repeat screening colonoscopy due to age   Small bowel capsule endoscopy January 2019: -Frequent gastric erosions -Normal small bowel mucosa      Medications   Current Outpatient Medications  Medication Sig Dispense Refill   acetaminophen (TYLENOL) 500 MG tablet Take 1,000 mg by mouth 2 (two) times daily  as needed for moderate pain or headache.     acyclovir (ZOVIRAX) 400 MG tablet Take 1 tablet (400 mg total) by mouth 2 (two) times daily. 60 tablet 6   albuterol (PROVENTIL HFA;VENTOLIN HFA) 108 (90 BASE) MCG/ACT inhaler Inhale 2 puffs into the lungs every 6 (six) hours as needed for shortness of breath.     Ascorbic Acid 500 MG CHEW Chew by mouth.     aspirin EC 81 MG tablet Take 81 mg by mouth at bedtime.     B Complex Vitamins (VITAMIN B COMPLEX) TABS Take 1 tablet by mouth daily.     bortezomib SQ (VELCADE) 3.5 MG injection Inject into the skin once.     busPIRone (BUSPAR) 5 MG tablet Take 1 tablet (5 mg total) by mouth daily. 30 tablet 3   Calcium Carbonate-Vitamin D (CALCIUM 600+D PO) Take 1 tablet by mouth 2 (two) times daily.     chlorthalidone (HYGROTON) 25 MG tablet Take 25 mg by mouth daily.      cholecalciferol (VITAMIN D) 25 MCG (1000 UNIT) tablet Take 1,000 Units by mouth daily.     cyanocobalamin 1000 MCG tablet Take 1 tablet by mouth daily.     cyclobenzaprine (FLEXERIL) 10 MG tablet TAKE 1/2 TABLET BY MOUTH TWICE DAILY AS NEEDED FOR MUSCLE SPASMS. 20 tablet 0   Daratumumab-Hyaluronidase-fihj (DARZALEX FASPRO [place]) Inject into the skin.     diclofenac Sodium (VOLTAREN) 1 % GEL Apply 4 g topically 4 (four) times daily. 50 g 0   fluticasone (  FLONASE) 50 MCG/ACT nasal spray USE 2 SPRAYS IN EACH NOSTRIL ONCE DAILY. 16 g 0   folic acid (FOLVITE) 1 MG tablet TAKE 1 TABLET BY MOUTH ONCE DAILY. (Patient taking differently: Take 1 mg by mouth daily.) 30 tablet 11   gabapentin (NEURONTIN) 100 MG capsule Take 100 mg by mouth 3 (three) times daily.     hydrochlorothiazide (MICROZIDE) 12.5 MG capsule Take 12.5 mg by mouth daily.     hydrOXYzine (VISTARIL) 25 MG capsule      Hyoscyamine Sulfate SL 0.125 MG SUBL      lenalidomide (REVLIMID) 15 MG capsule Take 1 capsule (15 mg total) by mouth daily. 21 days on, 7 days off every 28 days 21 capsule 0   lidocaine (XYLOCAINE) 2 % solution TAKE 2  TEASPOONFULS BEFORE MEALS AND AT BEDTIME AS NEEDED MAY REPEAT EVERY 4 HOURS. (MAX OF 8 DOSES PER DAY) 300 mL 0   Lidocaine HCl (XOLIDO) 2 % CREA Apply 1 application topically daily as needed (arthritisis).     loperamide (IMODIUM) 2 MG capsule      losartan (COZAAR) 100 MG tablet Take 100 mg by mouth daily.     magnesium oxide (MAG-OX) 400 (240 Mg) MG tablet Take 1 tablet (400 mg total) by mouth 2 (two) times daily. 60 tablet 4   metoprolol tartrate (LOPRESSOR) 50 MG tablet Take 1 tablet (50 mg total) by mouth 2 (two) times daily. (Patient taking differently: Take 25 mg by mouth 2 (two) times daily.) 180 tablet 1   montelukast (SINGULAIR) 10 MG tablet Take 10 mg by mouth daily.      MYRBETRIQ 25 MG TB24 tablet TAKE 1 TABLET BY MOUTH ONCE DAILY. 30 tablet 0   nitroGLYCERIN (NITROSTAT) 0.4 MG SL tablet Place 0.4 mg under the tongue every 5 (five) minutes as needed for chest pain.     pantoprazole (PROTONIX) 40 MG tablet Take 40 mg by mouth daily.     potassium chloride SA (KLOR-CON M) 20 MEQ tablet Take 1 tablet (20 mEq total) by mouth 3 (three) times daily. 21 tablet 0   primidone (MYSOLINE) 50 MG tablet Take 75 mg by mouth daily.     Probiotic Product (GNP PROBIOTIC COLON SUPPORT) CAPS Take 1 capsule by mouth daily.     sertraline (ZOLOFT) 100 MG tablet Take 200 mg by mouth daily.      simvastatin (ZOCOR) 40 MG tablet Take 40 mg by mouth at bedtime.     sucralfate (CARAFATE) 1 GM/10ML suspension      warfarin (COUMADIN) 3 MG tablet Take 3 mg by mouth daily.     No current facility-administered medications for this visit.    Allergies   Allergies as of 02/02/2022 - Review Complete 02/02/2022  Allergen Reaction Noted   Amoxicillin-pot clavulanate Other (See Comments) 07/29/2020   Biaxin [clarithromycin] Other (See Comments)    Erythromycin  04/02/2020   Lisinopril Swelling        Review of Systems   General: Negative for anorexia, weight loss, fever, chills, fatigue, weakness. ENT:  Negative for hoarseness, difficulty swallowing , nasal congestion. CV: Negative for chest pain, angina, palpitations, dyspnea on exertion, peripheral edema.  Respiratory: Negative for dyspnea at rest, dyspnea on exertion, cough, sputum, wheezing.  GI: See history of present illness. GU:  Negative for dysuria, hematuria, urinary incontinence, urinary frequency, nocturnal urination.  Endo: Negative for unusual weight change.     Physical Exam   BP 129/75 (BP Location: Left Arm, Patient Position: Sitting, Cuff  Size: Normal)   Pulse 85   Temp 97.7 F (36.5 C) (Temporal)   Ht 5' 3" (1.6 m)   Wt 165 lb 9.6 oz (75.1 kg)   SpO2 98%   BMI 29.33 kg/m    General: Well-nourished, well-developed in no acute distress.  Eyes: No icterus. Mouth: Oropharyngeal mucosa moist and pink , no lesions erythema or exudate Abdomen: Bowel sounds are normal, nontender, nondistended, no hepatosplenomegaly or masses,  no abdominal bruits or hernia , no rebound or guarding.  Rectal: not performed  Extremities: No lower extremity edema. No clubbing or deformities. Neuro: Alert and oriented x 4   Skin: Warm and dry, no jaundice.   Psych: Alert and cooperative, normal mood and affect.  Labs   Lab Results  Component Value Date   CREATININE 0.72 02/02/2022   BUN 15 02/02/2022   NA 138 02/02/2022   K 3.7 02/02/2022   CL 103 02/02/2022   CO2 29 02/02/2022   Lab Results  Component Value Date   ALT 27 02/02/2022   AST 21 02/02/2022   ALKPHOS 60 02/02/2022   BILITOT 0.9 02/02/2022   Lab Results  Component Value Date   WBC 3.6 (L) 02/02/2022   HGB 12.1 02/02/2022   HCT 37.1 02/02/2022   MCV 101.6 (H) 02/02/2022   PLT 103 (L) 02/02/2022    Imaging Studies   DG Chest 2 View  Result Date: 01/07/2022 CLINICAL DATA:  Chest pain EXAM: CHEST - 2 VIEW COMPARISON:  09/26/2021 and multiple prior radiographs. CT 09/20/2021 FINDINGS: Unchanged car mediastinal silhouette dual chamber pacemaker leads. Unchanged  right lung and left basilar scarring. There is no new focal airspace disease. No pleural effusion. No pneumothorax. No acute osseous abnormality. Thoracic spondylosis. IMPRESSION: Stable lung scarring.  No new focal airspace disease. Electronically Signed   By: Maurine Simmering M.D.   On: 01/07/2022 13:20    Assessment   GERD: Doing well on pantoprazole.  IBS: Intermittent constipation.  Takes milk of magnesia at times.  Rarely has diarrhea, may use Pepto-Bismol or Imodium when it occurs.  Currently bowel movements are regular, occasionally hard stools.  Discussed using MiraLAX as needed.    PLAN   Continue pantoprazole 40 mg daily. Consider MiraLAX half to 1 capful daily as needed to maintain soft stools. Return to the office in 1 year or sooner if needed.   Laureen Ochs. Bobby Rumpf, [place], [facility]

## 2022-02-08 ENCOUNTER — Telehealth: Payer: Self-pay | Admitting: Family Medicine

## 2022-02-08 NOTE — Telephone Encounter (Signed)
Pt called stating she is needing a new rx. Myrvetriq for bladder??? Needs refill?

## 2022-02-09 ENCOUNTER — Ambulatory Visit (INDEPENDENT_AMBULATORY_CARE_PROVIDER_SITE_OTHER): Payer: Medicare Other | Admitting: Family Medicine

## 2022-02-09 ENCOUNTER — Encounter: Payer: Self-pay | Admitting: Family Medicine

## 2022-02-09 ENCOUNTER — Other Ambulatory Visit: Payer: Self-pay

## 2022-02-09 VITALS — BP 133/71 | HR 82 | Ht 63.0 in | Wt 167.0 lb

## 2022-02-09 DIAGNOSIS — R32 Unspecified urinary incontinence: Secondary | ICD-10-CM | POA: Diagnosis not present

## 2022-02-09 DIAGNOSIS — M546 Pain in thoracic spine: Secondary | ICD-10-CM

## 2022-02-09 DIAGNOSIS — E559 Vitamin D deficiency, unspecified: Secondary | ICD-10-CM | POA: Diagnosis not present

## 2022-02-09 DIAGNOSIS — R5382 Chronic fatigue, unspecified: Secondary | ICD-10-CM | POA: Diagnosis not present

## 2022-02-09 DIAGNOSIS — R7301 Impaired fasting glucose: Secondary | ICD-10-CM

## 2022-02-09 LAB — POCT URINALYSIS DIP (CLINITEK)
Bilirubin, UA: NEGATIVE
Glucose, UA: NEGATIVE mg/dL
Ketones, POC UA: NEGATIVE mg/dL
Leukocytes, UA: NEGATIVE
Nitrite, UA: NEGATIVE
Spec Grav, UA: 1.02 (ref 1.010–1.025)
Urobilinogen, UA: 1 E.U./dL
pH, UA: 7.5 (ref 5.0–8.0)

## 2022-02-09 MED ORDER — MIRABEGRON ER 25 MG PO TB24: 25.0000 mg | ORAL_TABLET | Freq: Every day | ORAL | 0 refills | Status: DC

## 2022-02-09 MED ORDER — LENALIDOMIDE 15 MG PO CAPS: 15.0000 mg | ORAL_CAPSULE | Freq: Every day | ORAL | 0 refills | Status: DC

## 2022-02-09 NOTE — Telephone Encounter (Signed)
Chart reviewed. Revlimid refilled per last office note with Dr. Katragadda.  

## 2022-02-09 NOTE — Patient Instructions (Addendum)
I appreciate the opportunity to provide care to you today!    Follow up: 02/16/22  Labs: please stop by the lab during the week to get your blood drawn (TSH, Lipid profile, HgA1c, Vit D)  Continue taking flexeril as needed  Please pick up your refill of Myrbetriq at the pharmacy  Conservative managements for Low Back Pain include Heat therapy --  helps reduce muscle spasm Cold Therapy- helps reduce edema Take OTC: Tylenol    Please continue to a heart-healthy diet and increase your physical activities. Try to exercise for 69mns at least three times a week.      It was a pleasure to see you and I look forward to continuing to work together on your health and well-being. Please do not hesitate to call the office if you need care or have questions about your care.   Have a wonderful day and week. With Gratitude, GAlvira MondayMSN, FNP

## 2022-02-09 NOTE — Progress Notes (Unsigned)
le

## 2022-02-09 NOTE — Telephone Encounter (Signed)
Refill sent.

## 2022-02-09 NOTE — Progress Notes (Unsigned)
Acute Office Visit  Subjective:     Patient ID: Courtney Rogers, female    DOB: 1950/05/30, 71 y.o.   MRN: [id number]  Chief Complaint  Patient presents with   Back Pain    Pt c/o left upper back pain onset for 4 days.     HPI The patient is in today with c/o left thoracic back pain.  It started four days ago; the pain is sharp and radiates to her lumbar spine. Pain is aggravated when sitting upright and relief when lying down. She has a hx of myeloma cancer and denies fever, and unintentional weight loss. Denies any specific injury to the back, dysuria, hematuria, no saddle anesthesia, no bilateral lower extremity weakness, no chest pain, or shortness of breath.   She requested a refill of her mirabegron prescription.     Review of Systems  Constitutional:  Positive for malaise/fatigue. Negative for chills, fever and weight loss.  Respiratory:  Negative for cough.   Cardiovascular:  Negative for chest pain and palpitations.  Gastrointestinal:  Negative for abdominal pain, diarrhea, nausea and vomiting.  Genitourinary:  Positive for frequency. Negative for dysuria, flank pain, hematuria and urgency.  Musculoskeletal:  Positive for back pain.  Neurological:  Negative for dizziness, tremors, weakness and headaches.        Objective:    BP 133/71   Pulse 82   Ht '5\' 3"'$  (1.6 m)   Wt 167 lb 0.6 oz (75.8 kg)   SpO2 98%   BMI 29.59 kg/m    Physical Exam HENT:     Head: Normocephalic.  Eyes:     Extraocular Movements: Extraocular movements intact.     Pupils: Pupils are equal, round, and reactive to light.  Cardiovascular:     Rate and Rhythm: Normal rate and regular rhythm.     Pulses: Normal pulses.     Heart sounds: Normal heart sounds.  Pulmonary:     Effort: Pulmonary effort is normal.     Breath sounds: Normal breath sounds.  Abdominal:     Palpations: Abdomen is soft.     Tenderness: There is no right CVA tenderness or left CVA tenderness.   Musculoskeletal:     Thoracic back: Tenderness present. No swelling, deformity, lacerations or spasms. Normal range of motion.     Lumbar back: No tenderness. Normal range of motion.  Neurological:     Mental Status: She is alert.     Results for orders placed or performed in visit on 02/09/22  POCT URINALYSIS DIP (CLINITEK)  Result Value Ref Range   Color, UA yellow yellow   Clarity, UA clear clear   Glucose, UA negative negative mg/dL   Bilirubin, UA negative negative   Ketones, POC UA negative negative mg/dL   Spec Grav, UA 1.020 1.010 - 1.025   Blood, UA trace-intact (A) negative   pH, UA 7.5 5.0 - 8.0   POC PROTEIN,UA trace negative, trace   Urobilinogen, UA 1.0 0.2 or 1.0 E.U./dL   Nitrite, UA Negative Negative   Leukocytes, UA Negative Negative        Assessment & Plan:   Problem List Items Addressed This Visit       Other   Urinary incontinence    UA negative for leuk and nitrates She reports that she has been out of Mirabegron for one week Refilled Mirabegron        Relevant Medications   mirabegron ER (MYRBETRIQ) 25 MG TB24 tablet   Other Relevant  Orders   POCT URINALYSIS DIP (CLINITEK) (Completed)   Fatigue    Hx of myeloma and following up with hematology and oncology Encouraged to eat a balanced diet and stay hydrated      Acute left-sided thoracic back pain - Primary    She denies fever, chills or night sweats  Encouraged conservative managements with flexeril, heat application and tylenol as needed Wt Readings from Last 3 Encounters:  02/09/22 167 lb 0.6 oz (75.8 kg)  02/02/22 165 lb 9.6 oz (75.1 kg)  01/19/22 166 lb 10.7 oz (75.6 kg)        Other Visit Diagnoses     Vitamin D deficiency       Relevant Orders   Vitamin D (25 hydroxy)   IFG (impaired fasting glucose)       Relevant Orders   Lipid Profile   Hemoglobin A1C   TSH + free T4       Meds ordered this encounter  Medications   mirabegron ER (MYRBETRIQ) 25 MG TB24  tablet    Sig: Take 1 tablet (25 mg total) by mouth daily.    Dispense:  30 tablet    Refill:  0    Return if symptoms worsen or fail to improve.  Alvira Monday, FNP

## 2022-02-10 DIAGNOSIS — M546 Pain in thoracic spine: Secondary | ICD-10-CM | POA: Insufficient documentation

## 2022-02-10 NOTE — Assessment & Plan Note (Signed)
UA negative for leuk and nitrates She reports that she has been out of Mirabegron for one week Refilled Mirabegron

## 2022-02-10 NOTE — Assessment & Plan Note (Signed)
Hx of myeloma and following up with hematology and oncology Encouraged to eat a balanced diet and stay hydrated

## 2022-02-10 NOTE — Assessment & Plan Note (Signed)
She denies fever, chills or night sweats  Encouraged conservative managements with flexeril, heat application and tylenol as needed Wt Readings from Last 3 Encounters:  02/09/22 167 lb 0.6 oz (75.8 kg)  02/02/22 165 lb 9.6 oz (75.1 kg)  01/19/22 166 lb 10.7 oz (75.6 kg)

## 2022-02-15 LAB — TSH+FREE T4
Free T4: 1.2 ng/dL (ref 0.82–1.77)
TSH: 0.716 u[IU]/mL (ref 0.450–4.500)

## 2022-02-15 LAB — HEMOGLOBIN A1C
Est. average glucose Bld gHb Est-mCnc: 105 mg/dL
Hgb A1c MFr Bld: 5.3 % (ref 4.8–5.6)

## 2022-02-15 LAB — LIPID PANEL
Chol/HDL Ratio: 1.8 ratio (ref 0.0–4.4)
Cholesterol, Total: 121 mg/dL (ref 100–199)
HDL: 68 mg/dL (ref >39)
LDL Chol Calc (NIH): 39 mg/dL (ref 0–99)
Triglycerides: 66 mg/dL (ref 0–149)
VLDL Cholesterol Cal: 14 mg/dL (ref 5–40)

## 2022-02-15 LAB — VITAMIN D 25 HYDROXY (VIT D DEFICIENCY, FRACTURES): Vit D, 25-Hydroxy: 34.2 ng/mL (ref 30.0–100.0)

## 2022-02-16 ENCOUNTER — Encounter: Payer: Self-pay | Admitting: Family Medicine

## 2022-02-16 ENCOUNTER — Inpatient Hospital Stay: Payer: Medicare Other

## 2022-02-16 ENCOUNTER — Ambulatory Visit (INDEPENDENT_AMBULATORY_CARE_PROVIDER_SITE_OTHER): Payer: Medicare Other | Admitting: Family Medicine

## 2022-02-16 VITALS — BP 142/79 | HR 87 | Temp 97.0°F | Resp 18 | Wt 171.4 lb

## 2022-02-16 VITALS — BP 131/79 | HR 79 | Ht 63.0 in | Wt 171.1 lb

## 2022-02-16 DIAGNOSIS — E876 Hypokalemia: Secondary | ICD-10-CM | POA: Diagnosis not present

## 2022-02-16 DIAGNOSIS — C9000 Multiple myeloma not having achieved remission: Secondary | ICD-10-CM

## 2022-02-16 DIAGNOSIS — I10 Essential (primary) hypertension: Secondary | ICD-10-CM | POA: Diagnosis not present

## 2022-02-16 DIAGNOSIS — D509 Iron deficiency anemia, unspecified: Secondary | ICD-10-CM

## 2022-02-16 DIAGNOSIS — Z5112 Encounter for antineoplastic immunotherapy: Secondary | ICD-10-CM | POA: Diagnosis not present

## 2022-02-16 LAB — COMPREHENSIVE METABOLIC PANEL
ALT: 21 U/L (ref 0–44)
AST: 20 U/L (ref 15–41)
Albumin: 4.1 g/dL (ref 3.5–5.0)
Alkaline Phosphatase: 69 U/L (ref 38–126)
Anion gap: 6 (ref 5–15)
BUN: 12 mg/dL (ref 8–23)
CO2: 29 mmol/L (ref 22–32)
Calcium: 8.3 mg/dL — ABNORMAL LOW (ref 8.9–10.3)
Chloride: 104 mmol/L (ref 98–111)
Creatinine, Ser: 0.69 mg/dL (ref 0.44–1.00)
GFR, Estimated: >60 mL/min (ref >60)
Glucose, Bld: 90 mg/dL (ref 70–99)
Potassium: 3.5 mmol/L (ref 3.5–5.1)
Sodium: 139 mmol/L (ref 135–145)
Total Bilirubin: 0.7 mg/dL (ref 0.3–1.2)
Total Protein: 6.7 g/dL (ref 6.5–8.1)

## 2022-02-16 LAB — CBC WITH DIFFERENTIAL/PLATELET
Abs Immature Granulocytes: 0.01 10*3/uL (ref 0.00–0.07)
Basophils Absolute: 0.1 10*3/uL (ref 0.0–0.1)
Basophils Relative: 1 %
Eosinophils Absolute: 0.6 10*3/uL — ABNORMAL HIGH (ref 0.0–0.5)
Eosinophils Relative: 18 %
HCT: 33.5 % — ABNORMAL LOW (ref 36.0–46.0)
Hemoglobin: 11 g/dL — ABNORMAL LOW (ref 12.0–15.0)
Immature Granulocytes: 0 %
Lymphocytes Relative: 23 %
Lymphs Abs: 0.8 10*3/uL (ref 0.7–4.0)
MCH: 33.6 pg (ref 26.0–34.0)
MCHC: 32.8 g/dL (ref 30.0–36.0)
MCV: 102.4 fL — ABNORMAL HIGH (ref 80.0–100.0)
Monocytes Absolute: 0.4 10*3/uL (ref 0.1–1.0)
Monocytes Relative: 12 %
Neutro Abs: 1.7 10*3/uL (ref 1.7–7.7)
Neutrophils Relative %: 46 %
Platelets: 83 10*3/uL — ABNORMAL LOW (ref 150–400)
RBC: 3.27 MIL/uL — ABNORMAL LOW (ref 3.87–5.11)
RDW: 17.4 % — ABNORMAL HIGH (ref 11.5–15.5)
WBC: 3.6 10*3/uL — ABNORMAL LOW (ref 4.0–10.5)
nRBC: 0 % (ref 0.0–0.2)

## 2022-02-16 LAB — MAGNESIUM: Magnesium: 1.9 mg/dL (ref 1.7–2.4)

## 2022-02-16 MED ORDER — LOSARTAN POTASSIUM 100 MG PO TABS: 100.0000 mg | ORAL_TABLET | Freq: Every day | ORAL | 3 refills | Status: DC

## 2022-02-16 MED ORDER — DENOSUMAB 120 MG/1.7ML ~~LOC~~ SOLN
120.0000 mg | Freq: Once | SUBCUTANEOUS | Status: AC
  Administered 2022-02-16: 120 mg via SUBCUTANEOUS
  Filled 2022-02-16: qty 1.7

## 2022-02-16 MED ORDER — DEXAMETHASONE 4 MG PO TABS
20.0000 mg | ORAL_TABLET | Freq: Once | ORAL | Status: AC
  Administered 2022-02-16: 20 mg via ORAL
  Filled 2022-02-16: qty 5

## 2022-02-16 MED ORDER — BORTEZOMIB CHEMO SQ INJECTION 3.5 MG (2.5MG/ML)
1.0000 mg/m2 | Freq: Once | INTRAMUSCULAR | Status: AC
  Administered 2022-02-16: 1.75 mg via SUBCUTANEOUS
  Filled 2022-02-16: qty 0.7

## 2022-02-16 MED ORDER — PROCHLORPERAZINE MALEATE 10 MG PO TABS
10.0000 mg | ORAL_TABLET | Freq: Four times a day (QID) | ORAL | Status: DC | PRN
  Administered 2022-02-16: 10 mg via ORAL
  Filled 2022-02-16: qty 1

## 2022-02-16 NOTE — Patient Instructions (Signed)
I appreciate the opportunity to provide care to you today!    Follow up:  4 months  Please pick up your medications at the pharmacy   Please continue to a heart-healthy diet and increase your physical activities. Try to exercise for 55mns at least three times a week.      It was a pleasure to see you and I look forward to continuing to work together on your health and well-being. Please do not hesitate to call the office if you need care or have questions about your care.   Have a wonderful day and week. With Gratitude, GAlvira MondayMSN, FNP-BC

## 2022-02-16 NOTE — Progress Notes (Unsigned)
Established Patient Office Visit  Subjective:  Patient ID: Courtney Rogers, female    DOB: 1950-06-14  Age: 71 y.o. MRN: [id number]  CC:  Chief Complaint  Patient presents with   Follow-up    Following up for htn and hld, states she is taking medications regularly. Pt still c/o dull side pain on left side is better than it was but pain is now in the lower side of her back.     HPI Courtney Rogers is a 71 y.o. female with past medical history of Iga myeloma, GERD, Essential Hypertension presents for f/u of  chronic medical conditions.  HTN: Controlled. She takes losartan 100 mg daily, metoprolol 25 mg twice daily, hydrochlorothiazide 12.5 mg daily.  She reports that her hydrochlorothiazide is prescribed by her cardiologist in [place], [place], Dr. Janith Lima.  She also takes potassium chloride 20 mEq 3 times daily.  She notes that her oncologist prescribed her potassium and would like a refill today.  She denies headaches, dizziness, blurred vision, chest pain, shortness of breath, and chest tightness.   Past Medical History:  Diagnosis Date   Acute myocardial infarction [facility]) 2009   CAD/no stent medically managed   Anxiety disorder    CAD (coronary artery disease)    Cancer (HCC)    Hyperlipidemia    Hypertension    IBS (irritable bowel syndrome)    Non-ulcer dyspepsia 04/30/2009   Qualifier: Diagnosis of  By: Oneida Alar MD, Sandi L    Overactive bladder    PE (pulmonary embolism) 10/2012   Presence of permanent cardiac pacemaker    Sleep apnea     Past Surgical History:  Procedure Laterality Date   ABDOMINAL HYSTERECTOMY     BSO secondary to cyst     CARDIAC CATHETERIZATION     COLONOSCOPY  12/2009   Dr. West Carbo, propofol, normal. Next TCS 12/2019   COLONOSCOPY N/A 05/22/2017   Examined portion ileum was normal, significant looping of colon, external hemorrhoids and rectal bleeding due to internal hemorrhoids, mild diverticulosis procedure: COLONOSCOPY;  Surgeon:  Danie Binder, MD;  [facility];  Service: Endoscopy;  Laterality: N/A;  10:30am   ESOPHAGOGASTRODUODENOSCOPY  05/11/2009   schatzki ring/small hiatal hernia/path:gastritis   ESOPHAGOGASTRODUODENOSCOPY N/A 05/22/2017   Multiple gastric polyps with biopsy benign fundic gland polyp, small bowel biopsy negative for celiac, gastric biopsy with mild gastritis but no H. pylori procedure: ESOPHAGOGASTRODUODENOSCOPY (EGD);  Surgeon: Danie Binder, MD;  Location: [facility];  Service: Endoscopy;  Laterality: N/A;   ESOPHAGOGASTRODUODENOSCOPY (EGD) WITH ESOPHAGEAL DILATION N/A 04/01/2013   GOT:[id number] at the gastroesophageal juction/multiple small polyps/mild gastritis   GIVENS CAPSULE STUDY N/A 06/07/2017   Frequent gastric erosions, normal small bowel mucosa.  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    Family History  Problem Relation Age of Onset   Colon polyps Neg Hx    Colon cancer Neg Hx     Social History   Socioeconomic History   Marital status: Married    Spouse name: Courtney Rogers   Number of children: 3   Years of education: 16   Highest education level: Not on file  Occupational History   Not on file  Tobacco Use   Smoking status: Never   Smokeless  tobacco: Never   Tobacco comments:    Never smoked   Vaping Use   Vaping Use: Never used  Substance and Sexual Activity   Alcohol use: Yes    Alcohol/week: 0.0 standard drinks of alcohol    Comment: occ wine   Drug use: No   Sexual activity: Not Currently  Other Topics Concern   Not on file  Social History Narrative      Lives with husband-51 years 37 in Aug 2021   Daughter is close by, 2 sons live further away   One IN [place],ONE IN [place], [place].        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   Enjoys: reading, young adult      Diet: eats all food groups    Caffeine: coffee 1, tea daily soda-1 daily   Water: 1-2 cups      Wears seat belt    Does not use phone while driving   Smoke detectors at home    Public house manager -locked up      Left handed   One story home   Drinks caffeine   Social Determinants of Health   Financial Resource Strain: Low Risk  (11/15/2021)   Overall Financial Resource Strain (CARDIA)    Difficulty of Paying Living Expenses: Not hard at all  Food Insecurity: No Food Insecurity (11/15/2021)   Hunger Vital Sign    Worried About Running Out of Food in the Last Year: Never true    Ran Out of Food in the Last Year: Never true  Transportation Needs: No Transportation Needs (11/15/2021)   PRAPARE - Hydrologist (Medical): No    Lack of Transportation (Non-Medical): No  Physical Activity: Inactive (11/26/2021)   Exercise Vital Sign    Days of Exercise per Week: 0 days    Minutes of Exercise per Session: 0 min  Stress: Stress Concern Present (11/26/2021)   [place]    Feeling of Stress : To some extent  Social Connections: Moderately Integrated (11/11/2020)   Social Connection and Isolation Panel [NHANES]    Frequency of Communication with Friends and Family: Once a week    Frequency of Social Gatherings with Friends and Family: Once a week    Attends Religious Services: More than 4 times per year    Active Member of [employer] or Organizations: Yes    Attends Music therapist: More than 4 times per year    Marital Status: Married  Human resources officer Violence: Not At Risk (11/11/2020)   Humiliation, Afraid, Rape, and Kick questionnaire    Fear of Current or Ex-Partner: No    Emotionally Abused: No    Physically Abused: No    Sexually Abused: No    Outpatient Medications Prior to Visit  Medication Sig  Dispense Refill   acetaminophen (TYLENOL) 500 MG tablet Take 1,000 mg by mouth 2 (two) times daily as needed for moderate pain or headache.     acyclovir (ZOVIRAX) 400 MG tablet Take 1 tablet (400 mg total) by mouth 2 (two) times daily. 60 tablet 6   albuterol (PROVENTIL HFA;VENTOLIN HFA) 108 (90 BASE) MCG/ACT inhaler Inhale 2 puffs into the lungs every 6 (six) hours as needed for shortness of breath.     Ascorbic Acid 500 MG CHEW Chew by mouth.     aspirin EC 81 MG tablet Take 81 mg by mouth at bedtime.  B Complex Vitamins (VITAMIN B COMPLEX) TABS Take 1 tablet by mouth daily.     bortezomib SQ (VELCADE) 3.5 MG injection Inject into the skin once.     busPIRone (BUSPAR) 5 MG tablet Take 1 tablet (5 mg total) by mouth daily. 30 tablet 3   Calcium Carbonate-Vitamin D (CALCIUM 600+D PO) Take 1 tablet by mouth 2 (two) times daily.     cholecalciferol (VITAMIN D) 25 MCG (1000 UNIT) tablet Take 1,000 Units by mouth daily.     cyanocobalamin 1000 MCG tablet Take 1 tablet by mouth daily.     cyclobenzaprine (FLEXERIL) 10 MG tablet TAKE 1/2 TABLET BY MOUTH TWICE DAILY AS NEEDED FOR MUSCLE SPASMS. 20 tablet 0   Daratumumab-Hyaluronidase-fihj (DARZALEX FASPRO [place]) Inject into the skin.     diclofenac Sodium (VOLTAREN) 1 % GEL Apply 4 g topically 4 (four) times daily. 50 g 0   fluticasone (FLONASE) 50 MCG/ACT nasal spray USE 2 SPRAYS IN EACH NOSTRIL ONCE DAILY. 16 g 0   folic acid (FOLVITE) 1 MG tablet TAKE 1 TABLET BY MOUTH ONCE DAILY. (Patient taking differently: Take 1 mg by mouth daily.) 30 tablet 11   gabapentin (NEURONTIN) 100 MG capsule Take 100 mg by mouth 3 (three) times daily.     hydrochlorothiazide (MICROZIDE) 12.5 MG capsule Take 12.5 mg by mouth daily.     hydrOXYzine (VISTARIL) 25 MG capsule      Hyoscyamine Sulfate SL 0.125 MG SUBL      lenalidomide (REVLIMID) 15 MG capsule Take 1 capsule (15 mg total) by mouth daily. 21 days on, 7 days off every 28 days 21 capsule 0   lidocaine  (XYLOCAINE) 2 % solution TAKE 2 TEASPOONFULS BEFORE MEALS AND AT BEDTIME AS NEEDED MAY REPEAT EVERY 4 HOURS. (MAX OF 8 DOSES PER DAY) 300 mL 0   Lidocaine HCl (XOLIDO) 2 % CREA Apply 1 application topically daily as needed (arthritisis).     loperamide (IMODIUM) 2 MG capsule as needed.     magnesium oxide (MAG-OX) 400 (240 Mg) MG tablet Take 1 tablet (400 mg total) by mouth 2 (two) times daily. 60 tablet 4   metoprolol tartrate (LOPRESSOR) 50 MG tablet Take 1 tablet (50 mg total) by mouth 2 (two) times daily. (Patient taking differently: Take 25 mg by mouth 2 (two) times daily.) 180 tablet 1   mirabegron ER (MYRBETRIQ) 25 MG TB24 tablet Take 1 tablet (25 mg total) by mouth daily. 30 tablet 0   montelukast (SINGULAIR) 10 MG tablet Take 10 mg by mouth daily.      nitroGLYCERIN (NITROSTAT) 0.4 MG SL tablet Place 0.4 mg under the tongue every 5 (five) minutes as needed for chest pain.     pantoprazole (PROTONIX) 40 MG tablet Take 40 mg by mouth daily.     potassium chloride SA (KLOR-CON M) 20 MEQ tablet Take 1 tablet (20 mEq total) by mouth 3 (three) times daily. 21 tablet 0   primidone (MYSOLINE) 50 MG tablet Take 75 mg by mouth daily.     Probiotic Product (GNP PROBIOTIC COLON SUPPORT) CAPS Take 1 capsule by mouth daily.     sertraline (ZOLOFT) 100 MG tablet Take 200 mg by mouth daily.      simvastatin (ZOCOR) 40 MG tablet Take 40 mg by mouth at bedtime.     warfarin (COUMADIN) 3 MG tablet Take 3 mg by mouth daily.     chlorthalidone (HYGROTON) 25 MG tablet Take 25 mg by mouth daily.  losartan (COZAAR) 100 MG tablet Take 100 mg by mouth daily.     No facility-administered medications prior to visit.    Allergies  Allergen Reactions   Amoxicillin-Pot Clavulanate Other (See Comments)   Biaxin [Clarithromycin] Other (See Comments)    Stomach problems    Erythromycin    Lisinopril Swelling    ROS Review of Systems  Constitutional:  Negative for fatigue and fever.  Eyes:  Negative for  photophobia, redness and visual disturbance.  Respiratory:  Negative for chest tightness and shortness of breath.   Cardiovascular:  Negative for chest pain and palpitations.  Skin:  Negative for rash and wound.  Neurological:  Negative for dizziness, light-headedness and headaches.      Objective:    Physical Exam HENT:     Head: Normocephalic.     Nose: No congestion.  Cardiovascular:     Rate and Rhythm: Normal rate and regular rhythm.     Pulses: Normal pulses.     Heart sounds: Normal heart sounds.  Pulmonary:     Effort: Pulmonary effort is normal.     Breath sounds: Normal breath sounds.  Neurological:     Mental Status: She is alert.     BP 131/79   Pulse 79   Ht _0  (1.6 m)   Wt 171 lb 1.9 oz (77.6 kg)   SpO2 97%   BMI 30.31 kg/m  Wt Readings from Last 3 Encounters:  02/16/22 171 lb 6.4 oz (77.7 kg)  02/16/22 171 lb 1.9 oz (77.6 kg)  02/09/22 167 lb 0.6 oz (75.8 kg)    Lab Results  Component Value Date   TSH 0.716 02/14/2022   Lab Results  Component Value Date   WBC 3.6 (L) 02/16/2022   HGB 11.0 (L) 02/16/2022   HCT 33.5 (L) 02/16/2022   MCV 102.4 (H) 02/16/2022   PLT 83 (L) 02/16/2022   Lab Results  Component Value Date   NA 139 02/16/2022   K 3.5 02/16/2022   CO2 29 02/16/2022   GLUCOSE 90 02/16/2022   BUN 12 02/16/2022   CREATININE 0.69 02/16/2022   BILITOT 0.7 02/16/2022   ALKPHOS 69 02/16/2022   AST 20 02/16/2022   ALT 21 02/16/2022   PROT 6.7 02/16/2022   ALBUMIN 4.1 02/16/2022   CALCIUM 8.3 (L) 02/16/2022   ANIONGAP 6 02/16/2022   EGFR 58 (L) 06/04/2021   Lab Results  Component Value Date   CHOL 121 02/14/2022   Lab Results  Component Value Date   HDL 68 02/14/2022   Lab Results  Component Value Date   LDLCALC 39 02/14/2022   Lab Results  Component Value Date   TRIG 66 02/14/2022   Lab Results  Component Value Date   CHOLHDL 1.8 02/14/2022   Lab Results  Component Value Date   HGBA1C 5.3 02/14/2022       Assessment & Plan:   Problem List Items Addressed This Visit       Cardiovascular and Mediastinum   Essential hypertension - Primary    Controlled She takes losartan 100 mg daily, metoprolol 25 mg twice daily, hydrochlorothiazide 12.5 mg daily She reports that her hydrochlorothiazide is prescribed by her cardiologist in [place], [place], Dr. Janith Lima She also takes potassium chloride 20 mEq 3 times daily She notes that her oncologist prescribed her potassium and would like a refill today She denies headaches, dizziness, blurred vision, chest pain, shortness of breath, and chest tightness Potassium levels within normal limits Encouraged pt to follow  up with oncologist regarding the continuation of potassium supplements Refilled losartan 100 mg   Chemistry      Component Value Date/Time   NA 139 02/16/2022 1327   NA 137 06/04/2021 1354   K 3.5 02/16/2022 1327   CL 104 02/16/2022 1327   CO2 29 02/16/2022 1327   BUN 12 02/16/2022 1327   BUN 22 06/04/2021 1354   CREATININE 0.69 02/16/2022 1327   CREATININE 0.74 09/24/2013 1119      Component Value Date/Time   CALCIUM 8.3 (L) 02/16/2022 1327   ALKPHOS 69 02/16/2022 1327   AST 20 02/16/2022 1327   ALT 21 02/16/2022 1327   BILITOT 0.7 02/16/2022 1327   BILITOT 0.3 02/25/2021 1314           Relevant Medications   losartan (COZAAR) 100 MG tablet   Other Visit Diagnoses     Hypokalemia           Meds ordered this encounter  Medications   losartan (COZAAR) 100 MG tablet    Sig: Take 1 tablet (100 mg total) by mouth daily.    Dispense:  30 tablet    Refill:  3    Follow-up: Return in about 4 months (around 06/18/2022).    Alvira Monday, FNP

## 2022-02-16 NOTE — Patient Instructions (Signed)
[facility]  Discharge Instructions: Thank you for choosing [facility] to provide your oncology and hematology care.  If you have a lab appointment with the [facility], please come in thru the Main Entrance and check in at the main information desk.  Wear comfortable clothing and clothing appropriate for easy access to any Portacath or PICC line.   We strive to give you quality time with your provider. You may need to reschedule your appointment if you arrive late (15 or more minutes).  Arriving late affects you and other patients whose appointments are after yours.  Also, if you miss three or more appointments without notifying the office, you may be dismissed from the clinic at the provider's discretion.      For prescription refill requests, have your pharmacy contact our office and allow 72 hours for refills to be completed.    Today you received the following chemotherapy and/or immunotherapy agents Velcade and Xgeva injection.   To help prevent nausea and vomiting after your treatment, we encourage you to take your nausea medication as directed.  Bortezomib Injection What is this medication? BORTEZOMIB (bor TEZ oh mib) treats lymphoma. It may also be used to treat multiple myeloma, a type of bone marrow cancer. It works by blocking a protein that causes cancer cells to grow and multiply. This helps to slow or stop the spread of cancer cells. This medicine may be used for other purposes; ask your health care provider or pharmacist if you have questions. COMMON BRAND NAME(S): Velcade What should I tell my care team before I take this medication? They need to know if you have any of these conditions: Dehydration Diabetes Heart disease Liver disease Tingling of the fingers or toes or other nerve disorder An unusual or allergic reaction to bortezomib, other medications, foods, dyes, or preservatives If you or your partner are pregnant or trying to  get pregnant Breastfeeding How should I use this medication? This medication is injected into a vein or under the skin. It is given by your care team in a hospital or clinic setting. Talk to your care team about the use of this medication in children. Special care may be needed. Overdosage: If you think you have taken too much of this medicine contact a poison control center or emergency room at once. NOTE: This medicine is only for you. Do not share this medicine with others. What if I miss a dose? Keep appointments for follow-up doses. It is important not to miss your dose. Call your care team if you are unable to keep an appointment. What may interact with this medication? Ketoconazole Rifampin This list may not describe all possible interactions. Give your health care provider a list of all the medicines, herbs, non-prescription drugs, or dietary supplements you use. Also tell them if you smoke, drink alcohol, or use illegal drugs. Some items may interact with your medicine. What should I watch for while using this medication? Your condition will be monitored carefully while you are receiving this medication. You may need blood work while taking this medication. This medication may affect your coordination, reaction time, or judgment. Do not drive or operate machinery until you know how this medication affects you. Sit up or stand slowly to reduce the risk of dizzy or fainting spells. Drinking alcohol with this medication can increase the risk of these side effects. This medication may increase your risk of getting an infection. Call your care team for advice if  you get a fever, chills, sore throat, or other symptoms of a cold or flu. Do not treat yourself. Try to avoid being around people who are sick. Check with your care team if you have severe diarrhea, nausea, and vomiting, or if you sweat a lot. The loss of too much body fluid may make it dangerous for you to take this medication. Talk to  your care team if you may be pregnant. Serious birth defects can occur if you take this medication during pregnancy and for 7 months after the last dose. You will need a negative pregnancy test before starting this medication. Contraception is recommended while taking this medication and for 7 months after the last dose. Your care team can help you find the option that works for you. If your partner can get pregnant, use a condom during sex while taking this medication and for 4 months after the last dose. Do not breastfeed while taking this medication and for 2 months after the last dose. This medication may cause infertility. Talk to your care team if you are concerned about your fertility. What side effects may I notice from receiving this medication? Side effects that you should report to your care team as soon as possible: Allergic reactions--skin rash, itching, hives, swelling of the face, lips, tongue, or throat Bleeding--bloody or black, tar-like stools, vomiting blood or brown material that looks like coffee grounds, red or dark brown urine, small red or purple spots on skin, unusual bruising or bleeding Bleeding in the brain--severe headache, stiff neck, confusion, dizziness, change in vision, numbness or weakness of the face, arm, or leg, trouble speaking, trouble walking, vomiting Bowel blockage--stomach cramping, unable to have a bowel movement or pass gas, loss of appetite, vomiting Heart failure--shortness of breath, swelling of the ankles, feet, or hands, sudden weight gain, unusual weakness or fatigue Infection--fever, chills, cough, sore throat, wounds that don't heal, pain or trouble when passing urine, general feeling of discomfort or being unwell Liver injury--right upper belly pain, loss of appetite, nausea, light-colored stool, dark yellow or brown urine, yellowing skin or eyes, unusual weakness or fatigue Low blood pressure--dizziness, feeling faint or lightheaded, blurry  vision Lung injury--shortness of breath or trouble breathing, cough, spitting up blood, chest pain, fever Pain, tingling, or numbness in the hands or feet Severe or prolonged diarrhea Stomach pain, bloody diarrhea, pale skin, unusual weakness or fatigue, decrease in the amount of urine, which may be signs of hemolytic uremic syndrome Sudden and severe headache, confusion, change in vision, seizures, which may be signs of posterior reversible encephalopathy syndrome (PRES) TTP--purple spots on the skin or inside the mouth, pale skin, yellowing skin or eyes, unusual weakness or fatigue, fever, fast or irregular heartbeat, confusion, change in vision, trouble speaking, trouble walking Tumor lysis syndrome (TLS)--nausea, vomiting, diarrhea, decrease in the amount of urine, dark urine, unusual weakness or fatigue, confusion, muscle pain or cramps, fast or irregular heartbeat, joint pain Side effects that usually do not require medical attention (report to your care team if they continue or are bothersome): Constipation Diarrhea Fatigue Loss of appetite Nausea This list may not describe all possible side effects. Call your doctor for medical advice about side effects. You may report side effects to FDA at 1-800-FDA-1088. Where should I keep my medication? This medication is given in a hospital or clinic. It will not be stored at home. NOTE: This sheet is a summary. It may not cover all possible information. If you have questions about this medicine,  talk to your doctor, pharmacist, or health care provider.  2023 Elsevier/Gold Standard (2021-10-06 00:00:00)   BELOW ARE SYMPTOMS THAT SHOULD BE REPORTED IMMEDIATELY: *FEVER GREATER THAN 100.4 F (38 C) OR HIGHER *CHILLS OR SWEATING *NAUSEA AND VOMITING THAT IS NOT CONTROLLED WITH YOUR NAUSEA MEDICATION *UNUSUAL SHORTNESS OF BREATH *UNUSUAL BRUISING OR BLEEDING *URINARY PROBLEMS (pain or burning when urinating, or frequent urination) *BOWEL  PROBLEMS (unusual diarrhea, constipation, pain near the anus) TENDERNESS IN MOUTH AND THROAT WITH OR WITHOUT PRESENCE OF ULCERS (sore throat, sores in mouth, or a toothache) UNUSUAL RASH, SWELLING OR PAIN  UNUSUAL VAGINAL DISCHARGE OR ITCHING   Items with * indicate a potential emergency and should be followed up as soon as possible or go to the Emergency Department if any problems should occur.  Please show the CHEMOTHERAPY ALERT CARD or IMMUNOTHERAPY ALERT CARD at check-in to the Emergency Department and triage nurse.  Should you have questions after your visit or need to cancel or reschedule your appointment, please contact [facility] [phone]  and follow the prompts.  Office hours are 8:00 a.m. to 4:30 p.m. Monday - Friday. Please note that voicemails left after 4:00 p.m. may not be returned until the following business day.  We are closed weekends and major holidays. You have access to a nurse at all times for urgent questions. Please call the main number to the clinic [phone] and follow the prompts.  For any non-urgent questions, you may also contact your provider using MyChart. We now offer e-Visits for anyone 68 and older to request care online for non-urgent symptoms. For details visit [email or web address].   Also download the MyChart app! Go to the app store, search "MyChart", open the app, select [facility], and log in with your MyChart username and password.  Masks are optional in the cancer centers. If you would like for your care team to wear a mask while they are taking care of you, please let them know. You may have one support person who is at least 73 years old accompany you for your appointments.

## 2022-02-16 NOTE — Progress Notes (Signed)
Pt presents today for Velcade and Xgeva injection per provider's order. Vital signs and labs WNL for treatment today. Okay to proceed with treatment today.  Pt's Calcium is 8.3 today. Pt reports taking Calcium and Vit D supplements as directed. Pt denies tooth or jaw pain and no recent or future dental appointments at this time.  Velcade and Xgeva injection given today per MD orders. Tolerated infusion without adverse affects. Vital signs stable. No complaints at this time. Discharged from clinic via wheelchair in stable condition. Alert and oriented x 3. F/U with [facility] as scheduled.

## 2022-02-17 NOTE — Assessment & Plan Note (Signed)
Controlled She takes losartan 100 mg daily, metoprolol 25 mg twice daily, hydrochlorothiazide 12.5 mg daily She reports that her hydrochlorothiazide is prescribed by her cardiologist in [place], [place], Dr. Janith Lima She also takes potassium chloride 20 mEq 3 times daily She notes that her oncologist prescribed her potassium and would like a refill today She denies headaches, dizziness, blurred vision, chest pain, shortness of breath, and chest tightness Potassium levels within normal limits Encouraged pt to follow up with oncologist regarding the continuation of potassium supplements Refilled losartan 100 mg   Chemistry      Component Value Date/Time   NA 139 02/16/2022 1327   NA 137 06/04/2021 1354   K 3.5 02/16/2022 1327   CL 104 02/16/2022 1327   CO2 29 02/16/2022 1327   BUN 12 02/16/2022 1327   BUN 22 06/04/2021 1354   CREATININE 0.69 02/16/2022 1327   CREATININE 0.74 09/24/2013 1119      Component Value Date/Time   CALCIUM 8.3 (L) 02/16/2022 1327   ALKPHOS 69 02/16/2022 1327   AST 20 02/16/2022 1327   ALT 21 02/16/2022 1327   BILITOT 0.7 02/16/2022 1327   BILITOT 0.3 02/25/2021 1314

## 2022-02-18 NOTE — Progress Notes (Signed)
Please inform the patient that her labs are stable

## 2022-03-01 ENCOUNTER — Other Ambulatory Visit: Payer: Self-pay

## 2022-03-01 DIAGNOSIS — C9000 Multiple myeloma not having achieved remission: Secondary | ICD-10-CM

## 2022-03-02 ENCOUNTER — Inpatient Hospital Stay: Payer: Medicare Other

## 2022-03-02 ENCOUNTER — Inpatient Hospital Stay: Payer: Medicare Other | Attending: Hematology

## 2022-03-02 VITALS — BP 115/64 | HR 82 | Temp 97.6°F | Resp 18 | Wt 170.6 lb

## 2022-03-02 DIAGNOSIS — Z79899 Other long term (current) drug therapy: Secondary | ICD-10-CM | POA: Insufficient documentation

## 2022-03-02 DIAGNOSIS — Z86711 Personal history of pulmonary embolism: Secondary | ICD-10-CM | POA: Diagnosis not present

## 2022-03-02 DIAGNOSIS — C9000 Multiple myeloma not having achieved remission: Secondary | ICD-10-CM

## 2022-03-02 DIAGNOSIS — Z7901 Long term (current) use of anticoagulants: Secondary | ICD-10-CM | POA: Diagnosis not present

## 2022-03-02 DIAGNOSIS — Z5112 Encounter for antineoplastic immunotherapy: Secondary | ICD-10-CM | POA: Insufficient documentation

## 2022-03-02 LAB — CBC WITH DIFFERENTIAL/PLATELET
Abs Immature Granulocytes: 0.02 10*3/uL (ref 0.00–0.07)
Basophils Absolute: 0.1 10*3/uL (ref 0.0–0.1)
Basophils Relative: 1 %
Eosinophils Absolute: 0.4 10*3/uL (ref 0.0–0.5)
Eosinophils Relative: 8 %
HCT: 36.6 % (ref 36.0–46.0)
Hemoglobin: 11.9 g/dL — ABNORMAL LOW (ref 12.0–15.0)
Immature Granulocytes: 0 %
Lymphocytes Relative: 24 %
Lymphs Abs: 1.2 10*3/uL (ref 0.7–4.0)
MCH: 33.2 pg (ref 26.0–34.0)
MCHC: 32.5 g/dL (ref 30.0–36.0)
MCV: 102.2 fL — ABNORMAL HIGH (ref 80.0–100.0)
Monocytes Absolute: 0.4 10*3/uL (ref 0.1–1.0)
Monocytes Relative: 7 %
Neutro Abs: 2.9 10*3/uL (ref 1.7–7.7)
Neutrophils Relative %: 60 %
Platelets: 137 10*3/uL — ABNORMAL LOW (ref 150–400)
RBC: 3.58 MIL/uL — ABNORMAL LOW (ref 3.87–5.11)
RDW: 17 % — ABNORMAL HIGH (ref 11.5–15.5)
WBC: 4.9 10*3/uL (ref 4.0–10.5)
nRBC: 0 % (ref 0.0–0.2)

## 2022-03-02 LAB — LACTATE DEHYDROGENASE: LDH: 129 U/L (ref 98–192)

## 2022-03-02 LAB — COMPREHENSIVE METABOLIC PANEL
ALT: 26 U/L (ref 0–44)
AST: 23 U/L (ref 15–41)
Albumin: 4 g/dL (ref 3.5–5.0)
Alkaline Phosphatase: 67 U/L (ref 38–126)
Anion gap: 7 (ref 5–15)
BUN: 16 mg/dL (ref 8–23)
CO2: 30 mmol/L (ref 22–32)
Calcium: 8.4 mg/dL — ABNORMAL LOW (ref 8.9–10.3)
Chloride: 103 mmol/L (ref 98–111)
Creatinine, Ser: 0.74 mg/dL (ref 0.44–1.00)
GFR, Estimated: >60 mL/min (ref >60)
Glucose, Bld: 89 mg/dL (ref 70–99)
Potassium: 3 mmol/L — ABNORMAL LOW (ref 3.5–5.1)
Sodium: 140 mmol/L (ref 135–145)
Total Bilirubin: 0.8 mg/dL (ref 0.3–1.2)
Total Protein: 6.4 g/dL — ABNORMAL LOW (ref 6.5–8.1)

## 2022-03-02 LAB — MAGNESIUM: Magnesium: 2.2 mg/dL (ref 1.7–2.4)

## 2022-03-02 MED ORDER — PROCHLORPERAZINE MALEATE 10 MG PO TABS
10.0000 mg | ORAL_TABLET | Freq: Once | ORAL | Status: AC
  Administered 2022-03-02: 10 mg via ORAL
  Filled 2022-03-02: qty 1

## 2022-03-02 MED ORDER — POTASSIUM CHLORIDE CRYS ER 20 MEQ PO TBCR
40.0000 meq | EXTENDED_RELEASE_TABLET | Freq: Once | ORAL | Status: AC
  Administered 2022-03-02: 40 meq via ORAL
  Filled 2022-03-02: qty 2

## 2022-03-02 MED ORDER — BORTEZOMIB CHEMO SQ INJECTION 3.5 MG (2.5MG/ML)
1.0000 mg/m2 | Freq: Once | INTRAMUSCULAR | Status: AC
  Administered 2022-03-02: 1.75 mg via SUBCUTANEOUS
  Filled 2022-03-02: qty 0.7

## 2022-03-02 NOTE — Patient Instructions (Signed)
[facility]  Discharge Instructions: Thank you for choosing [facility] to provide your oncology and hematology care.  If you have a lab appointment with the [facility], please come in thru the Main Entrance and check in at the main information desk.  Wear comfortable clothing and clothing appropriate for easy access to any Portacath or PICC line.   We strive to give you quality time with your provider. You may need to reschedule your appointment if you arrive late (15 or more minutes).  Arriving late affects you and other patients whose appointments are after yours.  Also, if you miss three or more appointments without notifying the office, you may be dismissed from the clinic at the provider's discretion.      For prescription refill requests, have your pharmacy contact our office and allow 72 hours for refills to be completed.    Today you received the following chemotherapy and/or immunotherapy agents velcade      To help prevent nausea and vomiting after your treatment, we encourage you to take your nausea medication as directed.  BELOW ARE SYMPTOMS THAT SHOULD BE REPORTED IMMEDIATELY: *FEVER GREATER THAN 100.4 F (38 C) OR HIGHER *CHILLS OR SWEATING *NAUSEA AND VOMITING THAT IS NOT CONTROLLED WITH YOUR NAUSEA MEDICATION *UNUSUAL SHORTNESS OF BREATH *UNUSUAL BRUISING OR BLEEDING *URINARY PROBLEMS (pain or burning when urinating, or frequent urination) *BOWEL PROBLEMS (unusual diarrhea, constipation, pain near the anus) TENDERNESS IN MOUTH AND THROAT WITH OR WITHOUT PRESENCE OF ULCERS (sore throat, sores in mouth, or a toothache) UNUSUAL RASH, SWELLING OR PAIN  UNUSUAL VAGINAL DISCHARGE OR ITCHING   Items with * indicate a potential emergency and should be followed up as soon as possible or go to the Emergency Department if any problems should occur.  Please show the CHEMOTHERAPY ALERT CARD or IMMUNOTHERAPY ALERT CARD at check-in to the Emergency  Department and triage nurse.  Should you have questions after your visit or need to cancel or reschedule your appointment, please contact [facility] [phone]  and follow the prompts.  Office hours are 8:00 a.m. to 4:30 p.m. Monday - Friday. Please note that voicemails left after 4:00 p.m. may not be returned until the following business day.  We are closed weekends and major holidays. You have access to a nurse at all times for urgent questions. Please call the main number to the clinic [phone] and follow the prompts.  For any non-urgent questions, you may also contact your provider using MyChart. We now offer e-Visits for anyone 14 and older to request care online for non-urgent symptoms. For details visit [email or web address].   Also download the MyChart app! Go to the app store, search "MyChart", open the app, select [facility], and log in with your MyChart username and password.  Masks are optional in the cancer centers. If you would like for your care team to wear a mask while they are taking care of you, please let them know. You may have one support person who is at least 73 years old accompany you for your appointments.

## 2022-03-02 NOTE — Progress Notes (Signed)
Patient presents today for Velcade injection per providers order.  Vital signs and labs within parameters for treatment.  Potassium noted to be 3.0.  Patient will receive 40 mEq PO Potassium today with injection.  Velcade administration without incident; injection site WNL; see MAR for injection details.  Patient tolerated procedure well and without incident.  No questions or complaints noted at this time.

## 2022-03-03 LAB — KAPPA/LAMBDA LIGHT CHAINS
Kappa free light chain: 9.7 mg/L (ref 3.3–19.4)
Kappa, lambda light chain ratio: 1.26 (ref 0.26–1.65)
Lambda free light chains: 7.7 mg/L (ref 5.7–26.3)

## 2022-03-07 LAB — PROTEIN ELECTROPHORESIS, SERUM
A/G Ratio: 1.9 — ABNORMAL HIGH (ref 0.7–1.7)
Albumin ELP: 3.7 g/dL (ref 2.9–4.4)
Alpha-1-Globulin: 0.2 g/dL (ref 0.0–0.4)
Alpha-2-Globulin: 0.6 g/dL (ref 0.4–1.0)
Beta Globulin: 0.8 g/dL (ref 0.7–1.3)
Gamma Globulin: 0.4 g/dL (ref 0.4–1.8)
Globulin, Total: 2 g/dL — ABNORMAL LOW (ref 2.2–3.9)
Total Protein ELP: 5.7 g/dL — ABNORMAL LOW (ref 6.0–8.5)

## 2022-03-09 LAB — IMMUNOFIXATION ELECTROPHORESIS
IgA: 99 mg/dL (ref 64–422)
IgG (Immunoglobin G), Serum: 431 mg/dL — ABNORMAL LOW (ref 586–1602)
IgM (Immunoglobulin M), Srm: 19 mg/dL — ABNORMAL LOW (ref 26–217)
Total Protein ELP: 5.6 g/dL — ABNORMAL LOW (ref 6.0–8.5)

## 2022-03-10 ENCOUNTER — Other Ambulatory Visit: Payer: Self-pay

## 2022-03-10 ENCOUNTER — Other Ambulatory Visit: Payer: Self-pay | Admitting: Physician Assistant

## 2022-03-10 MED ORDER — MAGNESIUM OXIDE -MG SUPPLEMENT 400 (240 MG) MG PO TABS: 400.0000 mg | ORAL_TABLET | Freq: Two times a day (BID) | ORAL | 4 refills | Status: DC

## 2022-03-14 ENCOUNTER — Other Ambulatory Visit: Payer: Self-pay

## 2022-03-14 ENCOUNTER — Other Ambulatory Visit: Payer: Self-pay | Admitting: Hematology

## 2022-03-14 MED ORDER — LENALIDOMIDE 10 MG PO CAPS: 10.0000 mg | ORAL_CAPSULE | Freq: Every day | ORAL | 0 refills | Status: DC

## 2022-03-14 NOTE — Telephone Encounter (Signed)
Chart reviewed. Revlimid refilled per last office note with Dr. Katragadda.  

## 2022-03-16 ENCOUNTER — Inpatient Hospital Stay: Payer: Medicare Other

## 2022-03-16 ENCOUNTER — Encounter: Payer: Self-pay | Admitting: Hematology

## 2022-03-16 ENCOUNTER — Inpatient Hospital Stay (HOSPITAL_BASED_OUTPATIENT_CLINIC_OR_DEPARTMENT_OTHER): Payer: Medicare Other | Admitting: Hematology

## 2022-03-16 DIAGNOSIS — Z5112 Encounter for antineoplastic immunotherapy: Secondary | ICD-10-CM | POA: Diagnosis not present

## 2022-03-16 DIAGNOSIS — C9000 Multiple myeloma not having achieved remission: Secondary | ICD-10-CM

## 2022-03-16 DIAGNOSIS — D509 Iron deficiency anemia, unspecified: Secondary | ICD-10-CM

## 2022-03-16 LAB — CBC WITH DIFFERENTIAL/PLATELET
Abs Immature Granulocytes: 0.01 10*3/uL (ref 0.00–0.07)
Basophils Absolute: 0.1 10*3/uL (ref 0.0–0.1)
Basophils Relative: 2 %
Eosinophils Absolute: 0.4 10*3/uL (ref 0.0–0.5)
Eosinophils Relative: 12 %
HCT: 35.4 % — ABNORMAL LOW (ref 36.0–46.0)
Hemoglobin: 11.7 g/dL — ABNORMAL LOW (ref 12.0–15.0)
Immature Granulocytes: 0 %
Lymphocytes Relative: 27 %
Lymphs Abs: 0.8 10*3/uL (ref 0.7–4.0)
MCH: 33.5 pg (ref 26.0–34.0)
MCHC: 33.1 g/dL (ref 30.0–36.0)
MCV: 101.4 fL — ABNORMAL HIGH (ref 80.0–100.0)
Monocytes Absolute: 0.4 10*3/uL (ref 0.1–1.0)
Monocytes Relative: 15 %
Neutro Abs: 1.2 10*3/uL — ABNORMAL LOW (ref 1.7–7.7)
Neutrophils Relative %: 44 %
Platelets: 88 10*3/uL — ABNORMAL LOW (ref 150–400)
RBC: 3.49 MIL/uL — ABNORMAL LOW (ref 3.87–5.11)
RDW: 16.1 % — ABNORMAL HIGH (ref 11.5–15.5)
WBC: 2.8 10*3/uL — ABNORMAL LOW (ref 4.0–10.5)
nRBC: 0 % (ref 0.0–0.2)

## 2022-03-16 LAB — COMPREHENSIVE METABOLIC PANEL
ALT: 18 U/L (ref 0–44)
AST: 27 U/L (ref 15–41)
Albumin: 3.9 g/dL (ref 3.5–5.0)
Alkaline Phosphatase: 48 U/L (ref 38–126)
Anion gap: 9 (ref 5–15)
BUN: 21 mg/dL (ref 8–23)
CO2: 29 mmol/L (ref 22–32)
Calcium: 8.4 mg/dL — ABNORMAL LOW (ref 8.9–10.3)
Chloride: 104 mmol/L (ref 98–111)
Creatinine, Ser: 1.04 mg/dL — ABNORMAL HIGH (ref 0.44–1.00)
GFR, Estimated: 57 mL/min — ABNORMAL LOW (ref >60)
Glucose, Bld: 98 mg/dL (ref 70–99)
Potassium: 3.6 mmol/L (ref 3.5–5.1)
Sodium: 142 mmol/L (ref 135–145)
Total Bilirubin: 0.7 mg/dL (ref 0.3–1.2)
Total Protein: 6.5 g/dL (ref 6.5–8.1)

## 2022-03-16 LAB — MAGNESIUM: Magnesium: 2.2 mg/dL (ref 1.7–2.4)

## 2022-03-16 MED ORDER — BORTEZOMIB CHEMO SQ INJECTION 3.5 MG (2.5MG/ML)
1.0000 mg/m2 | Freq: Once | INTRAMUSCULAR | Status: AC
  Administered 2022-03-16: 1.75 mg via SUBCUTANEOUS
  Filled 2022-03-16: qty 0.7

## 2022-03-16 MED ORDER — DENOSUMAB 120 MG/1.7ML ~~LOC~~ SOLN
120.0000 mg | Freq: Once | SUBCUTANEOUS | Status: AC
  Administered 2022-03-16: 120 mg via SUBCUTANEOUS
  Filled 2022-03-16: qty 1.7

## 2022-03-16 MED ORDER — DEXAMETHASONE 4 MG PO TABS
20.0000 mg | ORAL_TABLET | Freq: Once | ORAL | Status: AC
  Administered 2022-03-16: 20 mg via ORAL
  Filled 2022-03-16: qty 5

## 2022-03-16 MED ORDER — PROCHLORPERAZINE MALEATE 10 MG PO TABS
10.0000 mg | ORAL_TABLET | Freq: Four times a day (QID) | ORAL | Status: DC | PRN
  Administered 2022-03-16: 10 mg via ORAL
  Filled 2022-03-16: qty 1

## 2022-03-16 NOTE — Progress Notes (Signed)
Patient is taking Revlimid as prescribed.  She has not missed any doses and reports no side effects at this time.    Patient has been examined by Dr. Katragadda, and vital signs and labs have been reviewed. ANC, Creatinine, LFTs, hemoglobin, and platelets are within treatment parameters per M.D. - pt may proceed with treatment.  Primary RN and pharmacy notified.  

## 2022-03-16 NOTE — Patient Instructions (Signed)
[facility]  Discharge Instructions: Thank you for choosing [facility] to provide your oncology and hematology care.  If you have a lab appointment with the [facility], please come in thru the Main Entrance and check in at the main information desk.  Wear comfortable clothing and clothing appropriate for easy access to any Portacath or PICC line.   We strive to give you quality time with your provider. You may need to reschedule your appointment if you arrive late (15 or more minutes).  Arriving late affects you and other patients whose appointments are after yours.  Also, if you miss three or more appointments without notifying the office, you may be dismissed from the clinic at the provider's discretion.      For prescription refill requests, have your pharmacy contact our office and allow 72 hours for refills to be completed.    Today you received the following chemotherapy and/or immunotherapy agents Velcade and Xgeva      To help prevent nausea and vomiting after your treatment, we encourage you to take your nausea medication as directed.  BELOW ARE SYMPTOMS THAT SHOULD BE REPORTED IMMEDIATELY: *FEVER GREATER THAN 100.4 F (38 C) OR HIGHER *CHILLS OR SWEATING *NAUSEA AND VOMITING THAT IS NOT CONTROLLED WITH YOUR NAUSEA MEDICATION *UNUSUAL SHORTNESS OF BREATH *UNUSUAL BRUISING OR BLEEDING *URINARY PROBLEMS (pain or burning when urinating, or frequent urination) *BOWEL PROBLEMS (unusual diarrhea, constipation, pain near the anus) TENDERNESS IN MOUTH AND THROAT WITH OR WITHOUT PRESENCE OF ULCERS (sore throat, sores in mouth, or a toothache) UNUSUAL RASH, SWELLING OR PAIN  UNUSUAL VAGINAL DISCHARGE OR ITCHING   Items with * indicate a potential emergency and should be followed up as soon as possible or go to the Emergency Department if any problems should occur.  Please show the CHEMOTHERAPY ALERT CARD or IMMUNOTHERAPY ALERT CARD at check-in to the  Emergency Department and triage nurse.  Should you have questions after your visit or need to cancel or reschedule your appointment, please contact [facility] [phone]  and follow the prompts.  Office hours are 8:00 a.m. to 4:30 p.m. Monday - Friday. Please note that voicemails left after 4:00 p.m. may not be returned until the following business day.  We are closed weekends and major holidays. You have access to a nurse at all times for urgent questions. Please call the main number to the clinic [phone] and follow the prompts.  For any non-urgent questions, you may also contact your provider using MyChart. We now offer e-Visits for anyone 36 and older to request care online for non-urgent symptoms. For details visit [email or web address].   Also download the MyChart app! Go to the app store, search "MyChart", open the app, select [facility], and log in with your MyChart username and password.  Masks are optional in the cancer centers. If you would like for your care team to wear a mask while they are taking care of you, please let them know. You may have one support person who is at least 73 years old accompany you for your appointments.

## 2022-03-16 NOTE — Progress Notes (Signed)
[place], [place] [address]   CLINIC:  Medical Oncology/Hematology  PCP:  Alvira Monday, [address] / [place] [id number] [phone]   REASON FOR VISIT:  Follow-up for multiple myeloma  PRIOR THERAPY: Dara VRD 6 cycles completed on 11/30/2021  NGS Results: not done  CURRENT THERAPY: Maintenance Velcade every 2 weeks, and Revlimid 3 weeks on/1 week off  BRIEF ONCOLOGIC HISTORY:  Oncology History  IgA myeloma ([place])  05/06/2021 Initial Diagnosis   IgA myeloma ([facility])   08/10/2021 - 01/19/2022 Chemotherapy   Patient is on Treatment Plan : MYELOMA NEWLY DIAGNOSED TRANSPLANT CANDIDATE DaraVRd (Daratumumab SQ) q21d x 6 Cycles (Induction/Consolidation)     08/10/2021 -  Chemotherapy   Patient is on Treatment Plan : MYELOMA NEWLY DIAGNOSED TRANSPLANT CANDIDATE DaraVRd (Daratumumab SQ) q21d x 6 Cycles (Induction/Consolidation)       CANCER STAGING:  Cancer Staging  No matching staging information was found for the patient.  INTERVAL HISTORY:  [name], a 71 y.o. female, seen for follow-up of multiple myeloma and toxicity assessment prior to next cycle of Velcade.  Numbness in the left foot slightly worse than right foot and has been stable.  She reportedly was treated for sinus infection with doxycycline and prednisone.  Revlimid last dose was completed on 03/15/2022.  REVIEW OF SYSTEMS:  Review of Systems  Constitutional:  Negative for appetite change and fatigue.  HENT:   Positive for trouble swallowing.   Cardiovascular:  Positive for chest pain ([place]).  Musculoskeletal:  Negative for arthralgias.  Neurological:  Positive for numbness (Left more than right foot stable). Negative for dizziness.  Psychiatric/Behavioral:  Negative for sleep disturbance.   All other systems reviewed and are negative.   PAST MEDICAL/SURGICAL HISTORY:  Past Medical History:  Diagnosis Date   Acute myocardial infarction [facility])  2009   CAD/no stent medically managed   Anxiety disorder    CAD (coronary artery disease)    Cancer (HCC)    Hyperlipidemia    Hypertension    IBS (irritable bowel syndrome)    Non-ulcer dyspepsia 04/30/2009   Qualifier: Diagnosis of  By: Oneida Alar MD, Sandi L    Overactive bladder    PE (pulmonary embolism) 10/2012   Presence of permanent cardiac pacemaker    Sleep apnea    Past Surgical History:  Procedure Laterality Date   ABDOMINAL HYSTERECTOMY     BSO secondary to cyst     CARDIAC CATHETERIZATION     COLONOSCOPY  12/2009   Dr. West Carbo, propofol, normal. Next TCS 12/2019   COLONOSCOPY N/A 05/22/2017   Examined portion ileum was normal, significant looping of colon, external hemorrhoids and rectal bleeding due to internal hemorrhoids, mild diverticulosis procedure: COLONOSCOPY;  Surgeon: Danie Binder, MD;  [facility];  Service: Endoscopy;  Laterality: N/A;  10:30am   ESOPHAGOGASTRODUODENOSCOPY  05/11/2009   schatzki ring/small hiatal hernia/path:gastritis   ESOPHAGOGASTRODUODENOSCOPY N/A 05/22/2017   Multiple gastric polyps with biopsy benign fundic gland polyp, small bowel biopsy negative for celiac, gastric biopsy with mild gastritis but no H. pylori procedure: ESOPHAGOGASTRODUODENOSCOPY (EGD);  Surgeon: Danie Binder, MD;  [facility]: [facility];  Service: Endoscopy;  Laterality: N/A;   ESOPHAGOGASTRODUODENOSCOPY (EGD) WITH ESOPHAGEAL DILATION N/A 04/01/2013   [id number] at the gastroesophageal juction/multiple small polyps/mild gastritis   GIVENS CAPSULE STUDY N/A 06/07/2017   Frequent gastric erosions, normal small bowel mucosa.  Procedure: GIVENS CAPSULE STUDY;  Surgeon: Barney Drain  L, MD;  [facility]: [facility];  Service: Endoscopy;  Laterality: N/A;  7:30am   INSERT / REPLACE / REMOVE PACEMAKER     Last year per pt.(can't remember date)   REPLACEMENT TOTAL KNEE Right 08/2020    SOCIAL HISTORY:  Social History   Socioeconomic History    Marital status: Married    Spouse name: Games developer   Number of children: 3   Years of education: 16   Highest education level: Not on file  Occupational History   Not on file  Tobacco Use   Smoking status: Never   Smokeless tobacco: Never   Tobacco comments:    Never smoked   Vaping Use   Vaping Use: Never used  Substance and Sexual Activity   Alcohol use: Yes    Alcohol/week: 0.0 standard drinks of alcohol    Comment: occ wine   Drug use: No   Sexual activity: Not Currently  Other Topics Concern   Not on file  Social History Narrative      Lives with husband-51 years 31 in Aug 2021   Daughter is close by, 2 sons live further away   One IN [place],ONE IN [place], [place].        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   Enjoys: reading, young adult      Diet: eats all food groups    Caffeine: coffee 1, tea daily soda-1 daily   Water: 1-2 cups      Wears seat belt    Does not use phone while driving   Smoke detectors at home    Public house manager -locked up      Left handed   One story home   Drinks caffeine   Social Determinants of Health   Financial Resource Strain: Low Risk  (11/15/2021)   Overall Financial Resource Strain (CARDIA)    Difficulty of Paying Living Expenses: Not hard at all  Food Insecurity: No Food Insecurity (11/15/2021)   Hunger Vital Sign    Worried About Running Out of Food in the Last Year: Never true    Ran Out of Food in the Last Year: Never true  Transportation Needs: No Transportation Needs (11/15/2021)   PRAPARE - Hydrologist (Medical): No    Lack of Transportation (Non-Medical): No  Physical Activity: Inactive (11/26/2021)   Exercise Vital Sign    Days of Exercise per Week: 0 days    Minutes of Exercise per Session: 0 min  Stress: Stress Concern Present (11/26/2021)   [place]    Feeling of Stress : To some extent   Social Connections: Moderately Integrated (11/11/2020)   Social Connection and Isolation Panel [NHANES]    Frequency of Communication with Friends and Family: Once a week    Frequency of Social Gatherings with Friends and Family: Once a week    Attends Religious Services: More than 4 times per year    Active Member of [employer] or Organizations: Yes    Attends Archivist Meetings: More than 4 times per year    Marital Status: Married  Human resources officer Violence: Not At Risk (11/11/2020)   Humiliation, Afraid, Rape, and Kick questionnaire    Fear of Current or Ex-Partner: No    Emotionally Abused: No    Physically Abused: No    Sexually Abused: No    FAMILY HISTORY:  Family History  Problem Relation Age of  Onset   Colon polyps Neg Hx    Colon cancer Neg Hx     CURRENT MEDICATIONS:  Current Outpatient Medications  Medication Sig Dispense Refill   acetaminophen (TYLENOL) 500 MG tablet Take 1,000 mg by mouth 2 (two) times daily as needed for moderate pain or headache.     acyclovir (ZOVIRAX) 400 MG tablet Take 1 tablet (400 mg total) by mouth 2 (two) times daily. 60 tablet 6   albuterol (PROVENTIL HFA;VENTOLIN HFA) 108 (90 BASE) MCG/ACT inhaler Inhale 2 puffs into the lungs every 6 (six) hours as needed for shortness of breath.     ascorbic acid (VITAMIN C) 500 MG tablet Take 500 mg by mouth daily.     Ascorbic Acid 500 MG CHEW Chew by mouth.     aspirin EC 81 MG tablet Take 81 mg by mouth at bedtime.     B Complex Vitamins (VITAMIN B COMPLEX) TABS Take 1 tablet by mouth daily.     bortezomib SQ (VELCADE) 3.5 MG injection Inject into the skin once.     busPIRone (BUSPAR) 5 MG tablet Take 1 tablet (5 mg total) by mouth daily. 30 tablet 3   Calcium Carbonate-Vitamin D (CALCIUM 600+D PO) Take 1 tablet by mouth 2 (two) times daily.     cholecalciferol (GNP VITAMIN D-400) 10 MCG (400 UNIT) TABS tablet Take 400 Units by mouth every morning.     cholecalciferol (VITAMIN D) 25 MCG (1000  UNIT) tablet Take 1,000 Units by mouth daily.     cyanocobalamin 1000 MCG tablet Take 1 tablet by mouth daily.     cyclobenzaprine (FLEXERIL) 10 MG tablet TAKE 1/2 TABLET BY MOUTH TWICE DAILY AS NEEDED FOR MUSCLE SPASMS. 20 tablet 0   Daratumumab-Hyaluronidase-fihj (DARZALEX FASPRO [place]) Inject into the skin.     diclofenac Sodium (VOLTAREN) 1 % GEL Apply 4 g topically 4 (four) times daily. 50 g 0   fluticasone (FLONASE) 50 MCG/ACT nasal spray USE 2 SPRAYS IN EACH NOSTRIL ONCE DAILY. 16 g 0   folic acid (FOLVITE) 1 MG tablet TAKE 1 TABLET BY MOUTH ONCE DAILY. 30 tablet 11   gabapentin (NEURONTIN) 100 MG capsule Take 100 mg by mouth 3 (three) times daily.     gabapentin (NEURONTIN) 600 MG tablet Take by mouth.     hydrochlorothiazide (MICROZIDE) 12.5 MG capsule Take 12.5 mg by mouth daily.     hydrOXYzine (VISTARIL) 25 MG capsule      Hyoscyamine Sulfate SL 0.125 MG SUBL      lenalidomide (REVLIMID) 10 MG capsule Take 1 capsule (10 mg total) by mouth daily. 21 days on, 7 days off every 28 days 21 capsule 0   lidocaine (XYLOCAINE) 2 % solution TAKE 2 TEASPOONFULS BEFORE MEALS AND AT BEDTIME AS NEEDED MAY REPEAT EVERY 4 HOURS. (MAX OF 8 DOSES PER DAY) 300 mL 0   Lidocaine HCl (XOLIDO) 2 % CREA Apply 1 application topically daily as needed (arthritisis).     loperamide (IMODIUM) 2 MG capsule as needed.     losartan (COZAAR) 100 MG tablet Take 1 tablet (100 mg total) by mouth daily. 30 tablet 3   magnesium oxide (MAG-OX) 400 (240 Mg) MG tablet Take 1 tablet (400 mg total) by mouth 2 (two) times daily. 60 tablet 4   metoprolol tartrate (LOPRESSOR) 50 MG tablet Take 1 tablet (50 mg total) by mouth 2 (two) times daily. (Patient taking differently: Take 25 mg by mouth 2 (two) times daily.) 180 tablet 1   mirabegron  ER (MYRBETRIQ) 25 MG TB24 tablet Take 1 tablet (25 mg total) by mouth daily. 30 tablet 0   mirtazapine (REMERON) 15 MG tablet Take 15 mg by mouth at bedtime.     montelukast (SINGULAIR) 10 MG  tablet Take 10 mg by mouth daily.      nitroGLYCERIN (NITROSTAT) 0.4 MG SL tablet Place 0.4 mg under the tongue every 5 (five) minutes as needed for chest pain.     pantoprazole (PROTONIX) 40 MG tablet Take 40 mg by mouth daily.     potassium chloride SA (KLOR-CON M) 20 MEQ tablet Take 1 tablet (20 mEq total) by mouth 3 (three) times daily. 21 tablet 0   primidone (MYSOLINE) 50 MG tablet Take 75 mg by mouth daily.     Probiotic Product (GNP PROBIOTIC COLON SUPPORT) CAPS Take 1 capsule by mouth daily.     sertraline (ZOLOFT) 100 MG tablet Take 200 mg by mouth daily.      simvastatin (ZOCOR) 40 MG tablet Take 40 mg by mouth at bedtime.     traMADol (ULTRAM) 50 MG tablet Take 50 mg by mouth 2 (two) times daily.     warfarin (COUMADIN) 3 MG tablet Take 3 mg by mouth daily.     No current facility-administered medications for this visit.    ALLERGIES:  Allergies  Allergen Reactions   Amoxicillin-Pot Clavulanate Other (See Comments)   Biaxin [Clarithromycin] Other (See Comments)    Stomach problems    Erythromycin    Lisinopril Swelling    PHYSICAL EXAM:  Performance status (ECOG): 1 - Symptomatic but completely ambulatory  Vitals:   03/16/22 1009  BP: 132/77  Pulse: 84  Resp: 18  Temp: 98.1 F (36.7 C)  SpO2: 99%   Wt Readings from Last 3 Encounters:  03/16/22 167 lb 3.2 oz (75.8 kg)  03/02/22 170 lb 9.6 oz (77.4 kg)  02/16/22 171 lb 6.4 oz (77.7 kg)   Physical Exam Vitals reviewed.  Constitutional:      Appearance: Normal appearance.  Cardiovascular:     Rate and Rhythm: Normal rate and regular rhythm.     Pulses: Normal pulses.     Heart sounds: Normal heart sounds.  Pulmonary:     Effort: Pulmonary effort is normal.     Breath sounds: Normal breath sounds.  Neurological:     General: No focal deficit present.     Mental Status: She is alert and oriented to person, place, and time.  Psychiatric:        Mood and Affect: Mood normal.        Behavior: Behavior  normal.     LABORATORY DATA:  I have reviewed the labs as listed.     Latest Ref Rng & Units 03/16/2022    9:25 AM 03/02/2022   12:49 PM 02/16/2022    1:27 PM  CBC  WBC 4.0 - 10.5 K/uL 2.8  4.9  3.6   Hemoglobin 12.0 - 15.0 g/dL 11.7  11.9  11.0   Hematocrit 36.0 - 46.0 % 35.4  36.6  33.5   Platelets 150 - 400 K/uL 88  137  83       Latest Ref Rng & Units 03/16/2022    9:25 AM 03/02/2022   12:49 PM 02/16/2022    1:27 PM  CMP  Glucose 70 - 99 mg/dL 98  89  90   BUN 8 - 23 mg/dL '21  16  12   ' Creatinine 0.44 - 1.00 mg/dL 1.04  0.74  0.69   Sodium 135 - 145 mmol/L 142  140  139   Potassium 3.5 - 5.1 mmol/L 3.6  3.0  3.5   Chloride 98 - 111 mmol/L 104  103  104   CO2 22 - 32 mmol/L '29  30  29   ' Calcium 8.9 - 10.3 mg/dL 8.4  8.4  8.3   Total Protein 6.5 - 8.1 g/dL 6.5  6.4  6.7   Total Bilirubin 0.3 - 1.2 mg/dL 0.7  0.8  0.7   Alkaline Phos 38 - 126 U/L 48  67  69   AST 15 - 41 U/L '27  23  20   ' ALT 0 - 44 U/L '18  26  21     ' DIAGNOSTIC IMAGING:  I have independently reviewed the scans and discussed with the patient. No results found.   ASSESSMENT:  1.  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  Increased number of atypical plasma cells present 36% of all cell lines.  Plasma cells are kappa light chain restricted. -Unfortunately her specimen has reached cytogenetics and FISH lab week later due to bad weather and no viable plasma cells. -PET scan on 07/09/2019 showed no findings of active myeloma. -24-hour urine shows total protein 59 mg.  Urine immunofixation shows IgA kappa monoclonal protein.  LDH is 184. -Based on [facility] criteria she has high risk with about 45-50% probability of progression to myeloma in the next 2 years. -CTAP on 01/07/2020 shows acute superior endplate burst fracture of L1.  Mild associated paravertebral soft tissue thickening.  No other bone abnormalities. -Bone  density test on 02/07/2020 shows T score -0.5. - Bone marrow biopsy on 07/12/2021: Hypercellular with increased number of atypical plasma cells representing 30% of cells, displaying kappa light chain restriction. - FISH panel: Gain of 1 q. (high risk), T p53 deletion (high risk), monosomy 13 (standard risk) - Cytogenetics: Complex karyotype. - PET scan (06/24/2021): New hypermetabolic bone lesions involving left anterior iliac crest, left mid humerus, right superior pubic ramus, bilateral mid femurs, left calvarium.  Largest lytic lesion in the left iliac crest measuring 2.9 x 2.3 cm. - 2D echo on 07/29/2021: EF 50-55%.  There is distal septal and inferior apical hypokinesis.  (Carfilzomib based regimen was put on hold due to echo findings) - Dara VRD 6 cycles from 08/10/2021 through 11/30/2021 - Myeloma panel 12/07/2021, DIRA test was negative, M spike 0.2 g and light chain ratio normal. - Maintenance Velcade every 2 weeks and Revlimid 3 weeks on 1 week off Started on 12/20/2021  2.  Social/family history: - She uses walker to ambulate since her right knee replacement leading to stiffness.  She lives with older sister and husband who has dementia. - She is independent of ADLs and IADLs.  She also drives.   PLAN:  1.  Stage II IgA kappa multiple myeloma, high risk with del 17p: - She is tolerating Velcade and Revlimid well. - Myeloma labs from 03/02/2022 showed M spike was negative.  Free light chain ratio is 1.26 and normal.  Immunofixation shows IgG kappa.  Her initial immunofixation showed biclonal IgA kappa. - Reviewed labs today which showed mild leukopenia with ANC of 2.2 and thrombocytopenia with platelet count 88.  LFTs are normal.  Creatinine is 1.04 and slightly elevated, likely from recent doxycycline. - She will restart Revlimid on 03/22/2022, at reduced dose of 10 mg 3 weeks on/1 week off. -  She will proceed with Velcade 1 mg/m2 every other week today. - RTC 8 weeks for follow-up with  myeloma labs repeated 2 weeks prior.   2.  Electrolyte abnormalities: - Continue magnesium twice daily and potassium 20 mEq daily.   3.  Pulmonary embolism (2015): - Continue Coumadin indefinitely.  No bleeding issues.  4.  Hypotension: - Continue metoprolol and Cozaar.  Continue to hold Norvasc.  Blood pressure today is 130/77.  5.  ID prophylaxis: - Continue acyclovir 400 mg twice daily.  6.  Myeloma bone disease: - Continue denosumab monthly.  Calcium is 8.4..  7.  Peripheral neuropathy: - Bilateral foot numbness is stable.  No burning pains reported.   Orders placed this encounter:  Orders Placed This Encounter  Procedures   CBC with Differential   Comprehensive metabolic panel   Magnesium   Kappa/lambda light chains   Immunofixation electrophoresis   Protein electrophoresis, serum      Derek Jack, MD [place] [phone]

## 2022-03-16 NOTE — Patient Instructions (Signed)
[place] at [facility] Discharge Instructions   You were seen and examined today by Dr. Delton Coombes.  He reviewed the results of your lab work which are normal/stable.   We will proceed with your Velcade injection today.  Continue Revlimid as prescribed.   Return as scheduled.    Thank you for choosing [place] at [facility] to provide your oncology and hematology care.  To afford each patient quality time with our provider, please arrive at least 15 minutes before your scheduled appointment time.   If you have a lab appointment with the [facility] please come in thru the Main Entrance and check in at the main information desk.  You need to re-schedule your appointment should you arrive 10 or more minutes late.  We strive to give you quality time with our providers, and arriving late affects you and other patients whose appointments are after yours.  Also, if you no show three or more times for appointments you may be dismissed from the clinic at the providers discretion.     Again, thank you for choosing [facility].  Our hope is that these requests will decrease the amount of time that you wait before being seen by our physicians.       _____________________________________________________________  Should you have questions after your visit to [facility], please contact our office at [phone] and follow the prompts.  Our office hours are 8:00 a.m. and 4:30 p.m. Monday - Friday.  Please note that voicemails left after 4:00 p.m. may not be returned until the following business day.  We are closed weekends and major holidays.  You do have access to a nurse 24-7, just call the main number to the clinic [phone] and do not press any options, hold on the line and a nurse will answer the phone.    For prescription refill requests, have your pharmacy contact our office and allow 72 hours.    Due to Covid,  you will need to wear a mask upon entering the hospital. If you do not have a mask, a mask will be given to you at the Main Entrance upon arrival. For doctor visits, patients may have 1 support person age 40 or older with them. For treatment visits, patients can not have anyone with them due to social distancing guidelines and our immunocompromised population.

## 2022-03-16 NOTE — Progress Notes (Signed)
Patient presents today for Velcade and Xgeva injections per providers order.  Vital signs and labs reveiwed by the MD.  Message received from Anastasio Champion RN/Dr. Delton Coombes patient okay for treatment.  Patient has been taking Calcium and Vitamin D supplements, no jaw pain, and no prior and upcoming dental work.  Stable during administration without incident; injection site WNL; see MAR for injection details.  Patient tolerated procedure well and without incident.  No questions or complaints noted at this time.

## 2022-03-21 ENCOUNTER — Other Ambulatory Visit: Payer: Self-pay

## 2022-03-21 ENCOUNTER — Emergency Department (HOSPITAL_COMMUNITY): Payer: Medicare Other

## 2022-03-21 ENCOUNTER — Emergency Department (HOSPITAL_COMMUNITY)
Admission: EM | Admit: 2022-03-21 | Discharge: 2022-03-21 | Disposition: A | Discharge status: Home / Self Care | Payer: Medicare Other | Attending: Emergency Medicine | Admitting: Emergency Medicine

## 2022-03-21 ENCOUNTER — Encounter (HOSPITAL_COMMUNITY): Payer: Self-pay | Admitting: *Deleted

## 2022-03-21 DIAGNOSIS — M25461 Effusion, right knee: Secondary | ICD-10-CM | POA: Diagnosis not present

## 2022-03-21 DIAGNOSIS — Z7901 Long term (current) use of anticoagulants: Secondary | ICD-10-CM | POA: Diagnosis not present

## 2022-03-21 DIAGNOSIS — M25561 Pain in right knee: Secondary | ICD-10-CM | POA: Diagnosis present

## 2022-03-21 DIAGNOSIS — Z7982 Long term (current) use of aspirin: Secondary | ICD-10-CM | POA: Insufficient documentation

## 2022-03-21 MED ORDER — LIDOCAINE 4 % EX PTCH: 1.0000 | MEDICATED_PATCH | CUTANEOUS | 0 refills | Status: DC

## 2022-03-21 NOTE — ED Triage Notes (Signed)
Pt c/o right knee pain and numbness that started Saturday. Denies known injury. Denies numbness anywhere else other than the right knee.

## 2022-03-21 NOTE — ED Provider Notes (Signed)
[facility] EMERGENCY DEPARTMENT Provider Note   CSN: [id number] Arrival date & time: 03/21/22  2703     History Chief Complaint  Patient presents with   Knee Pain    Courtney Rogers is a 71 y.o. female.  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  Patient reports that on Thursday she had her usual physical therapy and the following day on Friday started having some numbness around her knee.  Denies any significant pain.  Says that she is still been able to walk.  Thinks that her knee may be slightly more swollen than usual and was concerned about possible injury so came in for evaluation.  Denies any slurred speech, vision changes, facial droop, numbness or weakness of her arms or legs elsewhere aside from toe numbness that she has as a result of her chemotherapy which is chronic.  She denies any back pain, bowel or bladder incontinence, or changes in sensation when she wipes.       Home Medications Prior to Admission medications   Medication Sig Start Date End Date Taking? Authorizing Provider  lidocaine (HM LIDOCAINE PATCH) 4 % Place 1 patch onto the skin daily. 03/21/22  Yes Fransico Meadow, MD  acetaminophen (TYLENOL) 500 MG tablet Take 1,000 mg by mouth 2 (two) times daily as needed for moderate pain or headache.    [provider]  acyclovir (ZOVIRAX) 400 MG tablet Take 1 tablet (400 mg total) by mouth 2 (two) times daily. 08/09/21   Derek Jack, MD  albuterol (PROVENTIL HFA;VENTOLIN HFA) 108 (90 BASE) MCG/ACT inhaler Inhale 2 puffs into the lungs every 6 (six) hours as needed for shortness of breath.    [provider]  ascorbic acid (VITAMIN C) 500 MG tablet Take 500 mg by mouth daily. 02/07/22   [provider]  Ascorbic Acid 500 MG CHEW Chew by mouth.     [provider]  aspirin EC 81 MG tablet Take 81 mg by mouth at bedtime.    [provider]  B Complex Vitamins (VITAMIN B COMPLEX) TABS Take 1 tablet by mouth daily.    [provider]  bortezomib SQ (VELCADE) 3.5 MG injection Inject into the skin once. 08/10/21   [provider]  busPIRone (BUSPAR) 5 MG tablet Take 1 tablet (5 mg total) by mouth daily. 09/23/21   Renee Rival, FNP  Calcium Carbonate-Vitamin D (CALCIUM 600+D PO) Take 1 tablet by mouth 2 (two) times daily.    [provider]  cholecalciferol (GNP VITAMIN D-400) 10 MCG (400 UNIT) TABS tablet Take 400 Units by mouth every morning. 02/01/22   [provider]  cholecalciferol (VITAMIN D) 25 MCG (1000 UNIT) tablet Take 1,000 Units by mouth daily. 10/15/20   [provider]  cyanocobalamin 1000 MCG tablet Take 1 tablet by mouth daily. 10/12/20   [provider]  cyclobenzaprine (FLEXERIL) 10 MG tablet TAKE 1/2 TABLET BY MOUTH TWICE DAILY AS NEEDED FOR MUSCLE SPASMS. 01/19/22   Alvira Monday, FNP  Daratumumab-Hyaluronidase-fihj (DARZALEX FASPRO [place]) Inject into the skin. 08/10/21   [provider]  diclofenac Sodium (VOLTAREN) 1 % GEL Apply 4 g topically 4 (four) times daily. 11/19/21   Alvira Monday, FNP  fluticasone (FLONASE) 50 MCG/ACT nasal spray USE 2 SPRAYS IN EACH NOSTRIL ONCE DAILY. 10/06/21   Fayrene Helper, MD  folic acid (FOLVITE) 1 MG tablet TAKE 1 TABLET BY MOUTH ONCE DAILY. 03/15/22   Derek Jack, MD  gabapentin (NEURONTIN) 100 MG capsule Take 100 mg by mouth 3 (three) times daily. 05/07/19   [provider]  gabapentin (NEURONTIN) 600 MG tablet Take by mouth. 12/03/21   [provider]  hydrochlorothiazide (MICROZIDE) 12.5 MG capsule Take 12.5 mg by mouth daily. 11/04/21   [provider]  hydrOXYzine (VISTARIL) 25 MG capsule     [provider]  Hyoscyamine Sulfate SL 0.125 MG SUBL     [provider]  lenalidomide (REVLIMID) 10 MG capsule Take 1 capsule (10 mg total) by mouth daily. 21 days on, 7 days off every 28 days 03/14/22   Derek Jack, MD  lidocaine (XYLOCAINE) 2 % solution TAKE 2 TEASPOONFULS BEFORE MEALS AND AT BEDTIME AS NEEDED MAY REPEAT EVERY 4 HOURS. (MAX OF 8 DOSES PER DAY) 06/02/21   Mahala Menghini, PA-C  Lidocaine HCl (XOLIDO) 2 % CREA Apply 1 application topically daily as needed (arthritisis).    [provider]  loperamide (IMODIUM) 2 MG capsule as needed.    [provider]  losartan (COZAAR) 100 MG tablet Take 1 tablet (100 mg total) by mouth daily. 02/16/22 06/16/22  Alvira Monday, FNP  magnesium oxide (MAG-OX) 400 (240 Mg) MG tablet Take 1 tablet (400 mg total) by mouth 2 (two) times daily. 03/10/22   Dede Query T, PA-C  metoprolol tartrate (LOPRESSOR) 50 MG tablet Take 1 tablet (50 mg total) by mouth 2 (two) times daily. Patient taking differently: Take 25 mg by mouth 2 (two) times daily. 10/07/21   Paseda, Dewaine Conger, FNP  mirabegron ER (MYRBETRIQ) 25 MG TB24 tablet Take 1 tablet (25 mg total) by mouth daily. 02/09/22   Alvira Monday, FNP  mirtazapine (REMERON) 15 MG tablet Take 15 mg by mouth at bedtime. 01/12/22   [provider]  montelukast (SINGULAIR) 10 MG tablet Take 10 mg by mouth daily.     [provider]  nitroGLYCERIN (NITROSTAT) 0.4 MG SL tablet Place 0.4 mg under the tongue every 5 (five) minutes as needed for chest pain.    [provider]  pantoprazole (PROTONIX) 40 MG tablet Take 40 mg by mouth daily. 08/10/20   [provider]  potassium chloride SA (KLOR-CON M) 20 MEQ tablet Take 1 tablet (20 mEq total) by mouth 3 (three) times daily. 0/0/92   Delora Fuel, MD  primidone (MYSOLINE) 50 MG tablet Take 75 mg by mouth daily. 03/31/21   [provider]  Probiotic Product (GNP PROBIOTIC COLON SUPPORT) CAPS Take 1 capsule by mouth daily. 07/08/21   [provider]   sertraline (ZOLOFT) 100 MG tablet Take 200 mg by mouth daily.     [provider]  simvastatin (ZOCOR) 40 MG tablet Take 40 mg by mouth at bedtime.    [provider]  traMADol (ULTRAM) 50 MG tablet Take 50 mg by mouth 2 (two) times daily. 03/08/22   [provider]  warfarin (COUMADIN) 3 MG tablet Take 3 mg by mouth daily. 06/23/21   [provider]      Allergies    Amoxicillin-pot clavulanate, Biaxin [clarithromycin], Erythromycin, and Lisinopril    Review of Systems   Review of Systems  Physical Exam Updated Vital Signs BP (!) 160/82 (BP Location: Left Arm)   Pulse 71   Temp 97.9 F (36.6 C) (Oral)   Resp 16   Ht _0  (1.626 m)  Wt 75.8 kg   SpO2 100%   BMI 28.67 kg/m  Physical Exam Vitals and nursing note reviewed.  Constitutional:      General: She is not in acute distress.    Appearance: She is well-developed.  HENT:     Head: Normocephalic and atraumatic.     Right Ear: External ear normal.     Left Ear: External ear normal.     Nose: Nose normal.  Eyes:     Extraocular Movements: Extraocular movements intact.     Conjunctiva/sclera: Conjunctivae normal.     Pupils: Pupils are equal, round, and reactive to light.     Comments: Pupils 4 mm bilaterally  Cardiovascular:     Rate and Rhythm: Normal rate and regular rhythm.  Pulmonary:     Effort: Pulmonary effort is normal. No respiratory distress.  Abdominal:     General: Abdomen is flat. There is no distension.     Palpations: Abdomen is soft. There is no mass.     Tenderness: There is no abdominal tenderness. There is no guarding.  Musculoskeletal:        General: No swelling.     Cervical back: Normal range of motion and neck supple.     Right lower leg: No edema.     Left lower leg: No edema.     Comments: Small right knee effusion.  Full range of motion.  Surgical scars present.  No erythema or warmth of the knee.  Motor: Muscle bulk and tone are normal. Strength is  5/5 in hip flexion, knee flexion and extension, ankle dorsiflexion and plantar flexion bilaterally. Full strength of great toe dorsiflexion bilaterally.  Sensory: Intact sensation to light touch in L2 though S1 dermatomes bilaterally.  Reports mild decreased sensation to light touch over lateral aspect and medial aspect of knee.   Skin:    General: Skin is warm and dry.     Capillary Refill: Capillary refill takes less than 2 seconds.  Neurological:     Mental Status: She is alert and oriented to person, place, and time. Mental status is at baseline.     Comments: MENTAL STATUS: AAOx3 CRANIAL NERVES: II: Pupils equal and reactive 4 mm BL, no RAPD III, IV, VI: EOM intact, no gaze preference or deviation, no nystagmus. V: normal sensation to light touch in V1, V2, and V3 segments bilaterally VII: no facial weakness or asymmetry, no nasolabial fold flattening VIII: normal hearing to speech and finger friction IX, X: normal palatal elevation, no uvular deviation XI: 5/5 head turn and 5/5 shoulder shrug bilaterally XII: midline tongue protrusion MOTOR: 5/5 strength in R shoulder flexion, elbow flexion and extension, and grip strength. 5/5 strength in L shoulder flexion, elbow flexion and extension, and grip strength.  5/5 strength in R hip and knee flexion, knee extension, ankle plantar and dorsiflexion. 5/5 strength in L hip and knee flexion, knee extension, ankle plantar and dorsiflexion. SENSORY: Normal sensation to light touch in all extremities with the exceptions noted above STATION: no truncal ataxia   Psychiatric:        Mood and Affect: Mood normal.     ED Results / Procedures / Treatments   Labs (all labs ordered are listed, but only abnormal results are displayed) Labs Reviewed - No data to display  EKG None  Radiology DG Knee Complete 4 Views Right  Result Date: 03/21/2022 CLINICAL DATA:  R knee pain and numbness EXAM: RIGHT KNEE - COMPLETE 4+ VIEW COMPARISON:  June  2023 FINDINGS: Status post total knee arthroplasty. Hardware alignment is within expected limits without findings of loosening or fracture. No acute fracture. No knee joint effusion. Soft tissues are unremarkable. IMPRESSION: Status post total knee arthroplasty without complicating feature. No acute osseous abnormality. Electronically Signed   By: Albin Felling M.D.   On: 03/21/2022 08:41    Procedures Procedures   Medications Ordered in ED Medications - No data to display  ED Course/ Medical Decision Making/ A&P                           Medical Decision Making Amount and/or Complexity of Data Reviewed Radiology: ordered.  Risk OTC drugs.   Courtney Rogers is a 71 y.o. female with comorbidities that complicate the patient evaluation including multiple myeloma on treatment, PE on warfarin, and recent right knee replacement who presents with chief complaint of right knee numbness.  This patient presents to the ED for concern of complaints listed in HPI, this involves an extensive number of treatment options, and is a complaint that carries with it a high risk of complications and morbidity. Disposition including potential need for admission considered.   Initial Ddx:  Changes in station from operation/swelling, fracture, radiculopathy, stroke  MDM:  Feel that given the patient's nondermatomal changes in sensation after her operation and physical therapy that this is likely due to swelling that she has been having.  No obvious deformities so will obtain x-ray to ensure that there is not a fracture.  Feel that radiculopathy is less likely again for the above regions and also does not have any back pain.  No symptoms that would be concerning for stroke at this time and again the distribution is not what I would expect from stroke.  Plan:  X-ray  ED Summary/Re-evaluation:  Patient was reevaluated and was stable.  X-ray did not show evidence of fracture or other acute abnormality.  We  will have the patient follow-up with her orthopedist for additional evaluation.  Return precautions were discussed with the patient prior to discharge.  Also informed her to follow-up with her primary doctor in several days.  Did give lidocaine prescription as well.  Dispo: DC Home. Return precautions discussed including, but not limited to, those listed in the AVS. Allowed pt time to ask questions which were answered fully prior to dc.  Records reviewed Outpatient Clinic Notes I independently reviewed the following imaging with scope of interpretation limited to determining acute life threatening conditions related to emergency care: Extremity x-ray(s), which revealed no acute abnormality  I personally reviewed and interpreted cardiac monitoring: normal sinus rhythm  I have reviewed the patients home medications and made adjustments as needed  Final Clinical Impression(s) / ED Diagnoses Final diagnoses:  Acute pain of right knee    Rx / DC Orders ED Discharge Orders          Ordered    lidocaine (HM LIDOCAINE PATCH) 4 %  Every 24 hours        03/21/22 0928              Fransico Meadow, MD 03/21/22 [phone]

## 2022-03-21 NOTE — Discharge Instructions (Signed)
Today you were seen in the emergency department for your knee numbness.    In the emergency department you had x-rays and an evaluation that was reassuring.    At home, please take Tylenol, ice your knee, keep it elevated, and use lidocaine patches we have prescribed you for comfort.    Follow-up with your primary doctor in 2-3 days regarding your visit.  Please also follow-up with your orthopedic surgeon as soon as possible regarding your knee numbness.  Return immediately to the emergency department if you experience any of the following: Weakness or numbness of your foot, or any other concerning symptoms.    Thank you for visiting our Emergency Department. It was a pleasure taking care of you today.

## 2022-03-29 ENCOUNTER — Ambulatory Visit (INDEPENDENT_AMBULATORY_CARE_PROVIDER_SITE_OTHER): Payer: Medicare Other | Admitting: Internal Medicine

## 2022-03-29 ENCOUNTER — Encounter: Payer: Self-pay | Admitting: Internal Medicine

## 2022-03-29 VITALS — BP 126/75 | HR 90 | Ht 63.0 in | Wt 162.6 lb

## 2022-03-29 DIAGNOSIS — J0110 Acute frontal sinusitis, unspecified: Secondary | ICD-10-CM | POA: Diagnosis not present

## 2022-03-29 DIAGNOSIS — J309 Allergic rhinitis, unspecified: Secondary | ICD-10-CM

## 2022-03-29 MED ORDER — METHYLPREDNISOLONE 4 MG PO TBPK: ORAL_TABLET | ORAL | 0 refills | Status: DC

## 2022-03-29 NOTE — Progress Notes (Signed)
Acute Office Visit  Subjective:     Patient ID: Courtney Rogers, female    DOB: [birth year], 71 y.o.   MRN: [id number]  Chief Complaint  Patient presents with   Sinusitis   Courtney Rogers presents today for evaluation of persistent nasal congestion and sinusitis.  She reports a 1 month history of nasal congestion with clear drainage, bilateral frontal sinus pressure, throat irritation, headache, and chills.  She has not checked her temperature.  Courtney Rogers denies a cough currently.Marland Kitchen  She was evaluated [facility] and was prescribed doxycycline and prednisone for treatment of her symptoms.  Despite these measures her symptoms have persisted.  She is interested in additional treatment options today.  Courtney Rogers is currently managing her symptoms with DayQuil.  She has also been using fluticasone and Allegra.  Review of Systems  Constitutional:  Positive for chills and malaise/fatigue. Negative for fever.  HENT:  Positive for congestion, sinus pain and sore throat.   Respiratory:  Negative for cough, sputum production and shortness of breath.   All other systems reviewed and are negative.     Objective:    BP 126/75   Pulse 90   Ht '5\' 3"'$  (1.6 m)   Wt 162 lb 9.6 oz (73.8 kg)   SpO2 98%   BMI 28.80 kg/m    Physical Exam Constitutional:      General: She is not in acute distress.    Appearance: Normal appearance. She is not toxic-appearing.  HENT:     Head: Normocephalic and atraumatic.     Right Ear: External ear normal.     Left Ear: External ear normal.     Nose: Rhinorrhea present. No congestion.     Comments: No TTP of the frontal or maxillary sinuses    Mouth/Throat:     Mouth: Mucous membranes are moist.     Pharynx: Oropharynx is clear. No oropharyngeal exudate or posterior oropharyngeal erythema.  Eyes:     General: No scleral icterus.    Extraocular Movements: Extraocular movements intact.     Conjunctiva/sclera: Conjunctivae normal.      Pupils: Pupils are equal, round, and reactive to light.  Cardiovascular:     Rate and Rhythm: Normal rate and regular rhythm.     Pulses: Normal pulses.     Heart sounds: Normal heart sounds. No murmur heard.    No friction rub. No gallop.  Pulmonary:     Effort: Pulmonary effort is normal.     Breath sounds: Normal breath sounds. No wheezing, rhonchi or rales.  Abdominal:     General: Abdomen is flat. Bowel sounds are normal. There is no distension.     Palpations: Abdomen is soft.     Tenderness: There is no abdominal tenderness.  Musculoskeletal:        General: No swelling. Normal range of motion.     Cervical back: Normal range of motion.     Right lower leg: No edema.     Left lower leg: No edema.  Lymphadenopathy:     Cervical: No cervical adenopathy.  Skin:    General: Skin is warm and dry.     Capillary Refill: Capillary refill takes less than 2 seconds.     Coloration: Skin is not jaundiced.  Neurological:     General: No focal deficit present.     Mental Status: She is alert and oriented to person, place, and time.  Psychiatric:  Mood and Affect: Mood normal.        Behavior: Behavior normal.       Assessment & Plan:   Problem List Items Addressed This Visit       Allergic sinusitis    She presents today endorsing 1 month history of persistent sinusitis.  She describes nasal congestion with clear discharge and bilateral frontal sinus pressure.  Her throat is irritated and she endorses subjective chills.  There is no tenderness palpation over the frontal or maxillary sinuses today on exam.  She has previously been treated with antibiotics and prednisone, which did not improve her symptoms. -I reviewed with Courtney Rogers that her symptoms are most likely allergy related given that she has been treated with antibiotics.  I recommended that in addition to Allegra and fluticasone she use saline rinses and take a nasal decongestant.  She can continue to use DayQuil as  needed.  She requested a medication for symptom relief, noting that she felt significantly better after taking prednisone.  I have prescribed a Medrol Dosepak today.  She will follow-up as needed if her symptoms do not significantly improve or worsen.  Otherwise, she will present for routine care in late January.      Relevant Medications   methylPREDNISolone (MEDROL DOSEPAK) 4 MG TBPK tablet    Return if symptoms worsen or fail to improve.  Johnette Abraham, MD

## 2022-03-29 NOTE — Assessment & Plan Note (Signed)
She presents today endorsing 1 month history of persistent sinusitis.  She describes nasal congestion with clear discharge and bilateral frontal sinus pressure.  Her throat is irritated and she endorses subjective chills.  There is no tenderness palpation over the frontal or maxillary sinuses today on exam.  She has previously been treated with antibiotics and prednisone, which did not improve her symptoms. -I reviewed with Courtney Rogers that her symptoms are most likely allergy related given that she has been treated with antibiotics.  I recommended that in addition to Allegra and fluticasone she use saline rinses and take a nasal decongestant.  She can continue to use DayQuil as needed.  She requested a medication for symptom relief, noting that she felt significantly better after taking prednisone.  I have prescribed a Medrol Dosepak today.  She will follow-up as needed if her symptoms do not significantly improve or worsen.  Otherwise, she will present for routine care in late January.

## 2022-03-29 NOTE — Patient Instructions (Signed)
It was a pleasure to see you today.  Thank you for giving [place] the opportunity to be involved in your care.  Below is a brief recap of your visit and next steps.  We will plan to see you again in January.  Summary I recommend using saline rinse in each nostril at least 2 times daily. This should be followed by fluticasone nasal spray You can take a daily allergy medication such as Allegra as well I recommend a nasal decongestant such as sudafed   I have prescribed a medrol dosepak to be take for the next 6 days

## 2022-03-30 ENCOUNTER — Inpatient Hospital Stay: Payer: Medicare Other

## 2022-03-30 ENCOUNTER — Inpatient Hospital Stay: Payer: Medicare Other | Attending: Hematology

## 2022-03-30 VITALS — BP 133/78 | HR 94 | Temp 98.8°F | Resp 18

## 2022-03-30 DIAGNOSIS — C9000 Multiple myeloma not having achieved remission: Secondary | ICD-10-CM | POA: Insufficient documentation

## 2022-03-30 LAB — CBC WITH DIFFERENTIAL/PLATELET
Abs Immature Granulocytes: 0.02 10*3/uL (ref 0.00–0.07)
Basophils Absolute: 0.1 10*3/uL (ref 0.0–0.1)
Basophils Relative: 2 %
Eosinophils Absolute: 0.1 10*3/uL (ref 0.0–0.5)
Eosinophils Relative: 3 %
HCT: 37.8 % (ref 36.0–46.0)
Hemoglobin: 12.3 g/dL (ref 12.0–15.0)
Immature Granulocytes: 1 %
Lymphocytes Relative: 14 %
Lymphs Abs: 0.6 10*3/uL — ABNORMAL LOW (ref 0.7–4.0)
MCH: 33.1 pg (ref 26.0–34.0)
MCHC: 32.5 g/dL (ref 30.0–36.0)
MCV: 101.6 fL — ABNORMAL HIGH (ref 80.0–100.0)
Monocytes Absolute: 0.2 10*3/uL (ref 0.1–1.0)
Monocytes Relative: 4 %
Neutro Abs: 3.1 10*3/uL (ref 1.7–7.7)
Neutrophils Relative %: 76 %
Platelets: 111 10*3/uL — ABNORMAL LOW (ref 150–400)
RBC: 3.72 MIL/uL — ABNORMAL LOW (ref 3.87–5.11)
RDW: 15.7 % — ABNORMAL HIGH (ref 11.5–15.5)
WBC: 4.1 10*3/uL (ref 4.0–10.5)
nRBC: 0 % (ref 0.0–0.2)

## 2022-03-30 LAB — COMPREHENSIVE METABOLIC PANEL
ALT: 22 U/L (ref 0–44)
AST: 21 U/L (ref 15–41)
Albumin: 4.2 g/dL (ref 3.5–5.0)
Alkaline Phosphatase: 47 U/L (ref 38–126)
Anion gap: 9 (ref 5–15)
BUN: 16 mg/dL (ref 8–23)
CO2: 28 mmol/L (ref 22–32)
Calcium: 9.3 mg/dL (ref 8.9–10.3)
Chloride: 102 mmol/L (ref 98–111)
Creatinine, Ser: 0.81 mg/dL (ref 0.44–1.00)
GFR, Estimated: >60 mL/min (ref >60)
Glucose, Bld: 111 mg/dL — ABNORMAL HIGH (ref 70–99)
Potassium: 4 mmol/L (ref 3.5–5.1)
Sodium: 139 mmol/L (ref 135–145)
Total Bilirubin: 0.9 mg/dL (ref 0.3–1.2)
Total Protein: 6.8 g/dL (ref 6.5–8.1)

## 2022-03-30 LAB — MAGNESIUM: Magnesium: 1.9 mg/dL (ref 1.7–2.4)

## 2022-03-30 MED ORDER — PROCHLORPERAZINE MALEATE 10 MG PO TABS
10.0000 mg | ORAL_TABLET | Freq: Once | ORAL | Status: AC
  Administered 2022-03-30: 10 mg via ORAL
  Filled 2022-03-30: qty 1

## 2022-03-30 MED ORDER — BORTEZOMIB CHEMO SQ INJECTION 3.5 MG (2.5MG/ML)
1.0000 mg/m2 | Freq: Once | INTRAMUSCULAR | Status: AC
  Administered 2022-03-30: 1.75 mg via SUBCUTANEOUS
  Filled 2022-03-30: qty 0.7

## 2022-03-30 MED ORDER — LANREOTIDE ACETATE 120 MG/0.5ML ~~LOC~~ SOLN
SUBCUTANEOUS | Status: AC
  Filled 2022-03-30: qty 120

## 2022-03-30 NOTE — Progress Notes (Signed)
Pt presents today for Velcade per provider's order. Vital signs and labs WNL for treatment. Okay to proceed with treatment today.  Velcade given today per MD orders. Tolerated infusion without adverse affects. Vital signs stable. No complaints at this time. Discharged from clinic ambulatory with walker in stable condition. Alert and oriented x 3. F/U with [facility] as scheduled.

## 2022-03-30 NOTE — Patient Instructions (Signed)
[facility]  Discharge Instructions: Thank you for choosing [facility] to provide your oncology and hematology care.  If you have a lab appointment with the [facility], please come in thru the Main Entrance and check in at the main information desk.  Wear comfortable clothing and clothing appropriate for easy access to any Portacath or PICC line.   We strive to give you quality time with your provider. You may need to reschedule your appointment if you arrive late (15 or more minutes).  Arriving late affects you and other patients whose appointments are after yours.  Also, if you miss three or more appointments without notifying the office, you may be dismissed from the clinic at the provider's discretion.      For prescription refill requests, have your pharmacy contact our office and allow 72 hours for refills to be completed.    Today you received the following chemotherapy and/or immunotherapy agents Velcade injection   To help prevent nausea and vomiting after your treatment, we encourage you to take your nausea medication as directed.  Bortezomib Injection What is this medication? BORTEZOMIB (bor TEZ oh mib) treats lymphoma. It may also be used to treat multiple myeloma, a type of bone marrow cancer. It works by blocking a protein that causes cancer cells to grow and multiply. This helps to slow or stop the spread of cancer cells. This medicine may be used for other purposes; ask your health care provider or pharmacist if you have questions. COMMON BRAND NAME(S): Velcade What should I tell my care team before I take this medication? They need to know if you have any of these conditions: Dehydration Diabetes Heart disease Liver disease Tingling of the fingers or toes or other nerve disorder An unusual or allergic reaction to bortezomib, other medications, foods, dyes, or preservatives If you or your partner are pregnant or trying to get  pregnant Breastfeeding How should I use this medication? This medication is injected into a vein or under the skin. It is given by your care team in a hospital or clinic setting. Talk to your care team about the use of this medication in children. Special care may be needed. Overdosage: If you think you have taken too much of this medicine contact a poison control center or emergency room at once. NOTE: This medicine is only for you. Do not share this medicine with others. What if I miss a dose? Keep appointments for follow-up doses. It is important not to miss your dose. Call your care team if you are unable to keep an appointment. What may interact with this medication? Ketoconazole Rifampin This list may not describe all possible interactions. Give your health care provider a list of all the medicines, herbs, non-prescription drugs, or dietary supplements you use. Also tell them if you smoke, drink alcohol, or use illegal drugs. Some items may interact with your medicine. What should I watch for while using this medication? Your condition will be monitored carefully while you are receiving this medication. You may need blood work while taking this medication. This medication may affect your coordination, reaction time, or judgment. Do not drive or operate machinery until you know how this medication affects you. Sit up or stand slowly to reduce the risk of dizzy or fainting spells. Drinking alcohol with this medication can increase the risk of these side effects. This medication may increase your risk of getting an infection. Call your care team for advice if you get  a fever, chills, sore throat, or other symptoms of a cold or flu. Do not treat yourself. Try to avoid being around people who are sick. Check with your care team if you have severe diarrhea, nausea, and vomiting, or if you sweat a lot. The loss of too much body fluid may make it dangerous for you to take this medication. Talk to  your care team if you may be pregnant. Serious birth defects can occur if you take this medication during pregnancy and for 7 months after the last dose. You will need a negative pregnancy test before starting this medication. Contraception is recommended while taking this medication and for 7 months after the last dose. Your care team can help you find the option that works for you. If your partner can get pregnant, use a condom during sex while taking this medication and for 4 months after the last dose. Do not breastfeed while taking this medication and for 2 months after the last dose. This medication may cause infertility. Talk to your care team if you are concerned about your fertility. What side effects may I notice from receiving this medication? Side effects that you should report to your care team as soon as possible: Allergic reactions--skin rash, itching, hives, swelling of the face, lips, tongue, or throat Bleeding--bloody or black, tar-like stools, vomiting blood or brown material that looks like coffee grounds, red or dark brown urine, small red or purple spots on skin, unusual bruising or bleeding Bleeding in the brain--severe headache, stiff neck, confusion, dizziness, change in vision, numbness or weakness of the face, arm, or leg, trouble speaking, trouble walking, vomiting Bowel blockage--stomach cramping, unable to have a bowel movement or pass gas, loss of appetite, vomiting Heart failure--shortness of breath, swelling of the ankles, feet, or hands, sudden weight gain, unusual weakness or fatigue Infection--fever, chills, cough, sore throat, wounds that don't heal, pain or trouble when passing urine, general feeling of discomfort or being unwell Liver injury--right upper belly pain, loss of appetite, nausea, light-colored stool, dark yellow or brown urine, yellowing skin or eyes, unusual weakness or fatigue Low blood pressure--dizziness, feeling faint or lightheaded, blurry  vision Lung injury--shortness of breath or trouble breathing, cough, spitting up blood, chest pain, fever Pain, tingling, or numbness in the hands or feet Severe or prolonged diarrhea Stomach pain, bloody diarrhea, pale skin, unusual weakness or fatigue, decrease in the amount of urine, which may be signs of hemolytic uremic syndrome Sudden and severe headache, confusion, change in vision, seizures, which may be signs of posterior reversible encephalopathy syndrome (PRES) TTP--purple spots on the skin or inside the mouth, pale skin, yellowing skin or eyes, unusual weakness or fatigue, fever, fast or irregular heartbeat, confusion, change in vision, trouble speaking, trouble walking Tumor lysis syndrome (TLS)--nausea, vomiting, diarrhea, decrease in the amount of urine, dark urine, unusual weakness or fatigue, confusion, muscle pain or cramps, fast or irregular heartbeat, joint pain Side effects that usually do not require medical attention (report to your care team if they continue or are bothersome): Constipation Diarrhea Fatigue Loss of appetite Nausea This list may not describe all possible side effects. Call your doctor for medical advice about side effects. You may report side effects to FDA at 1-800-FDA-1088. Where should I keep my medication? This medication is given in a hospital or clinic. It will not be stored at home. NOTE: This sheet is a summary. It may not cover all possible information. If you have questions about this medicine, talk to  your doctor, pharmacist, or health care provider.  2023 Elsevier/Gold Standard (2021-10-06 00:00:00)   BELOW ARE SYMPTOMS THAT SHOULD BE REPORTED IMMEDIATELY: *FEVER GREATER THAN 100.4 F (38 C) OR HIGHER *CHILLS OR SWEATING *NAUSEA AND VOMITING THAT IS NOT CONTROLLED WITH YOUR NAUSEA MEDICATION *UNUSUAL SHORTNESS OF BREATH *UNUSUAL BRUISING OR BLEEDING *URINARY PROBLEMS (pain or burning when urinating, or frequent urination) *BOWEL  PROBLEMS (unusual diarrhea, constipation, pain near the anus) TENDERNESS IN MOUTH AND THROAT WITH OR WITHOUT PRESENCE OF ULCERS (sore throat, sores in mouth, or a toothache) UNUSUAL RASH, SWELLING OR PAIN  UNUSUAL VAGINAL DISCHARGE OR ITCHING   Items with * indicate a potential emergency and should be followed up as soon as possible or go to the Emergency Department if any problems should occur.  Please show the CHEMOTHERAPY ALERT CARD or IMMUNOTHERAPY ALERT CARD at check-in to the Emergency Department and triage nurse.  Should you have questions after your visit or need to cancel or reschedule your appointment, please contact [facility] [phone]  and follow the prompts.  Office hours are 8:00 a.m. to 4:30 p.m. Monday - Friday. Please note that voicemails left after 4:00 p.m. may not be returned until the following business day.  We are closed weekends and major holidays. You have access to a nurse at all times for urgent questions. Please call the main number to the clinic [phone] and follow the prompts.  For any non-urgent questions, you may also contact your provider using MyChart. We now offer e-Visits for anyone 69 and older to request care online for non-urgent symptoms. For details visit [email or web address].   Also download the MyChart app! Go to the app store, search "MyChart", open the app, select [facility], and log in with your MyChart username and password.  Masks are optional in the cancer centers. If you would like for your care team to wear a mask while they are taking care of you, please let them know. You may have one support person who is at least 73 years old accompany you for your appointments.

## 2022-04-05 ENCOUNTER — Other Ambulatory Visit: Payer: Self-pay

## 2022-04-05 MED ORDER — LENALIDOMIDE 10 MG PO CAPS: 10.0000 mg | ORAL_CAPSULE | Freq: Every day | ORAL | 0 refills | Status: DC

## 2022-04-05 NOTE — Telephone Encounter (Signed)
Chart reviewed. Revlimid refilled per last office note with Dr. Katragadda.  

## 2022-04-10 ENCOUNTER — Other Ambulatory Visit: Payer: Self-pay | Admitting: Family Medicine

## 2022-04-10 DIAGNOSIS — R32 Unspecified urinary incontinence: Secondary | ICD-10-CM

## 2022-04-13 ENCOUNTER — Inpatient Hospital Stay: Payer: Medicare Other

## 2022-04-13 ENCOUNTER — Ambulatory Visit: Payer: Medicare Other | Admitting: Hematology

## 2022-04-13 ENCOUNTER — Other Ambulatory Visit: Payer: Self-pay | Admitting: Gastroenterology

## 2022-04-13 VITALS — BP 127/64 | HR 99 | Temp 98.4°F | Resp 18

## 2022-04-13 DIAGNOSIS — C9000 Multiple myeloma not having achieved remission: Secondary | ICD-10-CM | POA: Diagnosis not present

## 2022-04-13 LAB — COMPREHENSIVE METABOLIC PANEL
ALT: 26 U/L (ref 0–44)
AST: 23 U/L (ref 15–41)
Albumin: 3.9 g/dL (ref 3.5–5.0)
Alkaline Phosphatase: 59 U/L (ref 38–126)
Anion gap: 4 — ABNORMAL LOW (ref 5–15)
BUN: 13 mg/dL (ref 8–23)
CO2: 30 mmol/L (ref 22–32)
Calcium: 7.7 mg/dL — ABNORMAL LOW (ref 8.9–10.3)
Chloride: 107 mmol/L (ref 98–111)
Creatinine, Ser: 0.85 mg/dL (ref 0.44–1.00)
GFR, Estimated: >60 mL/min (ref >60)
Glucose, Bld: 77 mg/dL (ref 70–99)
Potassium: 3.9 mmol/L (ref 3.5–5.1)
Sodium: 141 mmol/L (ref 135–145)
Total Bilirubin: 0.5 mg/dL (ref 0.3–1.2)
Total Protein: 6.4 g/dL — ABNORMAL LOW (ref 6.5–8.1)

## 2022-04-13 LAB — CBC WITH DIFFERENTIAL/PLATELET
Abs Immature Granulocytes: 0.01 10*3/uL (ref 0.00–0.07)
Basophils Absolute: 0.1 10*3/uL (ref 0.0–0.1)
Basophils Relative: 2 %
Eosinophils Absolute: 0.5 10*3/uL (ref 0.0–0.5)
Eosinophils Relative: 14 %
HCT: 35.1 % — ABNORMAL LOW (ref 36.0–46.0)
Hemoglobin: 11.8 g/dL — ABNORMAL LOW (ref 12.0–15.0)
Immature Granulocytes: 0 %
Lymphocytes Relative: 26 %
Lymphs Abs: 0.9 10*3/uL (ref 0.7–4.0)
MCH: 34.1 pg — ABNORMAL HIGH (ref 26.0–34.0)
MCHC: 33.6 g/dL (ref 30.0–36.0)
MCV: 101.4 fL — ABNORMAL HIGH (ref 80.0–100.0)
Monocytes Absolute: 0.4 10*3/uL (ref 0.1–1.0)
Monocytes Relative: 13 %
Neutro Abs: 1.5 10*3/uL — ABNORMAL LOW (ref 1.7–7.7)
Neutrophils Relative %: 45 %
Platelets: 81 10*3/uL — ABNORMAL LOW (ref 150–400)
RBC: 3.46 MIL/uL — ABNORMAL LOW (ref 3.87–5.11)
RDW: 15.5 % (ref 11.5–15.5)
WBC: 3.4 10*3/uL — ABNORMAL LOW (ref 4.0–10.5)
nRBC: 0 % (ref 0.0–0.2)

## 2022-04-13 LAB — MAGNESIUM: Magnesium: 1.9 mg/dL (ref 1.7–2.4)

## 2022-04-13 MED ORDER — DEXAMETHASONE 4 MG PO TABS
20.0000 mg | ORAL_TABLET | Freq: Once | ORAL | Status: AC
  Administered 2022-04-13: 20 mg via ORAL
  Filled 2022-04-13: qty 5

## 2022-04-13 MED ORDER — PROCHLORPERAZINE MALEATE 10 MG PO TABS
10.0000 mg | ORAL_TABLET | Freq: Four times a day (QID) | ORAL | Status: DC | PRN
  Filled 2022-04-13: qty 1

## 2022-04-13 MED ORDER — PROCHLORPERAZINE MALEATE 10 MG PO TABS
10.0000 mg | ORAL_TABLET | Freq: Once | ORAL | Status: AC
  Administered 2022-04-13: 10 mg via ORAL

## 2022-04-13 MED ORDER — BORTEZOMIB CHEMO SQ INJECTION 3.5 MG (2.5MG/ML)
1.0000 mg/m2 | Freq: Once | INTRAMUSCULAR | Status: AC
  Administered 2022-04-13: 1.75 mg via SUBCUTANEOUS
  Filled 2022-04-13: qty 0.7

## 2022-04-13 NOTE — Progress Notes (Signed)
Patient tolerated Velcade injection with no complaints voiced.  Lab work reviewed.  See MAR for details.  Injection site clean and dry with no bruising or swelling noted.  Patient stable during and after injection.  Band aid applied.  VSS.  Patient left in satisfactory condition with no s/s of distress noted.

## 2022-04-13 NOTE — Progress Notes (Addendum)
Pt presents today for Velcade per provider's order. Vital signs and labs WNL for treatment. Okay to proceed with treatment today.   Pt's was scheduled today for Xgeva, but pt's Calcium was 7.7. Dr.K made aware and stated to hold Xgeva until Calcium is 8 or above. Pt educated  on the importance of taking her Calcium /Vit D twice daily. Pt made aware and verbalized understanding.

## 2022-04-13 NOTE — Patient Instructions (Signed)
[facility]  Discharge Instructions: Thank you for choosing [facility] to provide your oncology and hematology care.  If you have a lab appointment with the [facility], please come in thru the Main Entrance and check in at the main information desk.  Wear comfortable clothing and clothing appropriate for easy access to any Portacath or PICC line.   We strive to give you quality time with your provider. You may need to reschedule your appointment if you arrive late (15 or more minutes).  Arriving late affects you and other patients whose appointments are after yours.  Also, if you miss three or more appointments without notifying the office, you may be dismissed from the clinic at the provider's discretion.      For prescription refill requests, have your pharmacy contact our office and allow 72 hours for refills to be completed.    Today you received the following chemotherapy and/or immunotherapy agents velcade.  Bortezomib Injection What is this medication? BORTEZOMIB (bor TEZ oh mib) treats lymphoma. It may also be used to treat multiple myeloma, a type of bone marrow cancer. It works by blocking a protein that causes cancer cells to grow and multiply. This helps to slow or stop the spread of cancer cells. This medicine may be used for other purposes; ask your health care provider or pharmacist if you have questions. COMMON BRAND NAME(S): Velcade What should I tell my care team before I take this medication? They need to know if you have any of these conditions: Dehydration Diabetes Heart disease Liver disease Tingling of the fingers or toes or other nerve disorder An unusual or allergic reaction to bortezomib, other medications, foods, dyes, or preservatives If you or your partner are pregnant or trying to get pregnant Breastfeeding How should I use this medication? This medication is injected into a vein or under the skin. It is given by your  care team in a hospital or clinic setting. Talk to your care team about the use of this medication in children. Special care may be needed. Overdosage: If you think you have taken too much of this medicine contact a poison control center or emergency room at once. NOTE: This medicine is only for you. Do not share this medicine with others. What if I miss a dose? Keep appointments for follow-up doses. It is important not to miss your dose. Call your care team if you are unable to keep an appointment. What may interact with this medication? Ketoconazole Rifampin This list may not describe all possible interactions. Give your health care provider a list of all the medicines, herbs, non-prescription drugs, or dietary supplements you use. Also tell them if you smoke, drink alcohol, or use illegal drugs. Some items may interact with your medicine. What should I watch for while using this medication? Your condition will be monitored carefully while you are receiving this medication. You may need blood work while taking this medication. This medication may affect your coordination, reaction time, or judgment. Do not drive or operate machinery until you know how this medication affects you. Sit up or stand slowly to reduce the risk of dizzy or fainting spells. Drinking alcohol with this medication can increase the risk of these side effects. This medication may increase your risk of getting an infection. Call your care team for advice if you get a fever, chills, sore throat, or other symptoms of a cold or flu. Do not treat yourself. Try to avoid being around  people who are sick. Check with your care team if you have severe diarrhea, nausea, and vomiting, or if you sweat a lot. The loss of too much body fluid may make it dangerous for you to take this medication. Talk to your care team if you may be pregnant. Serious birth defects can occur if you take this medication during pregnancy and for 7 months after the  last dose. You will need a negative pregnancy test before starting this medication. Contraception is recommended while taking this medication and for 7 months after the last dose. Your care team can help you find the option that works for you. If your partner can get pregnant, use a condom during sex while taking this medication and for 4 months after the last dose. Do not breastfeed while taking this medication and for 2 months after the last dose. This medication may cause infertility. Talk to your care team if you are concerned about your fertility. What side effects may I notice from receiving this medication? Side effects that you should report to your care team as soon as possible: Allergic reactions--skin rash, itching, hives, swelling of the face, lips, tongue, or throat Bleeding--bloody or black, tar-like stools, vomiting blood or brown material that looks like coffee grounds, red or dark brown urine, small red or purple spots on skin, unusual bruising or bleeding Bleeding in the brain--severe headache, stiff neck, confusion, dizziness, change in vision, numbness or weakness of the face, arm, or leg, trouble speaking, trouble walking, vomiting Bowel blockage--stomach cramping, unable to have a bowel movement or pass gas, loss of appetite, vomiting Heart failure--shortness of breath, swelling of the ankles, feet, or hands, sudden weight gain, unusual weakness or fatigue Infection--fever, chills, cough, sore throat, wounds that don't heal, pain or trouble when passing urine, general feeling of discomfort or being unwell Liver injury--right upper belly pain, loss of appetite, nausea, light-colored stool, dark yellow or brown urine, yellowing skin or eyes, unusual weakness or fatigue Low blood pressure--dizziness, feeling faint or lightheaded, blurry vision Lung injury--shortness of breath or trouble breathing, cough, spitting up blood, chest pain, fever Pain, tingling, or numbness in the hands  or feet Severe or prolonged diarrhea Stomach pain, bloody diarrhea, pale skin, unusual weakness or fatigue, decrease in the amount of urine, which may be signs of hemolytic uremic syndrome Sudden and severe headache, confusion, change in vision, seizures, which may be signs of posterior reversible encephalopathy syndrome (PRES) TTP--purple spots on the skin or inside the mouth, pale skin, yellowing skin or eyes, unusual weakness or fatigue, fever, fast or irregular heartbeat, confusion, change in vision, trouble speaking, trouble walking Tumor lysis syndrome (TLS)--nausea, vomiting, diarrhea, decrease in the amount of urine, dark urine, unusual weakness or fatigue, confusion, muscle pain or cramps, fast or irregular heartbeat, joint pain Side effects that usually do not require medical attention (report to your care team if they continue or are bothersome): Constipation Diarrhea Fatigue Loss of appetite Nausea This list may not describe all possible side effects. Call your doctor for medical advice about side effects. You may report side effects to FDA at 1-800-FDA-1088. Where should I keep my medication? This medication is given in a hospital or clinic. It will not be stored at home. NOTE: This sheet is a summary. It may not cover all possible information. If you have questions about this medicine, talk to your doctor, pharmacist, or health care provider.  2023 Elsevier/Gold Standard (2021-10-06 00:00:00)        To help   prevent nausea and vomiting after your treatment, we encourage you to take your nausea medication as directed.  BELOW ARE SYMPTOMS THAT SHOULD BE REPORTED IMMEDIATELY: *FEVER GREATER THAN 100.4 F (38 C) OR HIGHER *CHILLS OR SWEATING *NAUSEA AND VOMITING THAT IS NOT CONTROLLED WITH YOUR NAUSEA MEDICATION *UNUSUAL SHORTNESS OF BREATH *UNUSUAL BRUISING OR BLEEDING *URINARY PROBLEMS (pain or burning when urinating, or frequent urination) *BOWEL PROBLEMS (unusual diarrhea,  constipation, pain near the anus) TENDERNESS IN MOUTH AND THROAT WITH OR WITHOUT PRESENCE OF ULCERS (sore throat, sores in mouth, or a toothache) UNUSUAL RASH, SWELLING OR PAIN  UNUSUAL VAGINAL DISCHARGE OR ITCHING   Items with * indicate a potential emergency and should be followed up as soon as possible or go to the Emergency Department if any problems should occur.  Please show the CHEMOTHERAPY ALERT CARD or IMMUNOTHERAPY ALERT CARD at check-in to the Emergency Department and triage nurse.  Should you have questions after your visit or need to cancel or reschedule your appointment, please contact [facility] [phone]  and follow the prompts.  Office hours are 8:00 a.m. to 4:30 p.m. Monday - Friday. Please note that voicemails left after 4:00 p.m. may not be returned until the following business day.  We are closed weekends and major holidays. You have access to a nurse at all times for urgent questions. Please call the main number to the clinic [phone] and follow the prompts.  For any non-urgent questions, you may also contact your provider using MyChart. We now offer e-Visits for anyone 18 and older to request care online for non-urgent symptoms. For details visit [email or web address].   Also download the MyChart app! Go to the app store, search "MyChart", open the app, select [facility], and log in with your MyChart username and password.  Masks are optional in the cancer centers. If you would like for your care team to wear a mask while they are taking care of you, please let them know. You may have one support person who is at least 73 years old accompany you for your appointments.  

## 2022-04-18 ENCOUNTER — Other Ambulatory Visit: Payer: Self-pay | Admitting: *Deleted

## 2022-04-19 ENCOUNTER — Other Ambulatory Visit: Payer: Self-pay | Admitting: Hematology

## 2022-04-20 ENCOUNTER — Telehealth: Payer: Self-pay | Admitting: *Deleted

## 2022-04-20 ENCOUNTER — Other Ambulatory Visit: Payer: Self-pay | Admitting: *Deleted

## 2022-04-20 MED ORDER — HYDROCODONE-ACETAMINOPHEN 5-325 MG PO TABS: 1.0000 | ORAL_TABLET | Freq: Three times a day (TID) | ORAL | 0 refills | Status: DC | PRN

## 2022-04-20 NOTE — Telephone Encounter (Signed)
Patient called with c/o of rib and back pain, for which she was given Tramadol in October.  States that is is not working for her and needs something else for pain.  Per Dr. Delton Coombes will send Hydrocodone every 8 hours as needed.  She also complains of severe fatigue, which I explained was likely coming from treatment.

## 2022-04-27 ENCOUNTER — Ambulatory Visit: Payer: Medicare Other

## 2022-04-27 ENCOUNTER — Inpatient Hospital Stay: Payer: Medicare Other

## 2022-04-27 ENCOUNTER — Inpatient Hospital Stay: Payer: Medicare Other | Attending: Hematology

## 2022-04-27 ENCOUNTER — Other Ambulatory Visit: Payer: Medicare Other

## 2022-04-27 VITALS — BP 125/66 | HR 89 | Temp 97.5°F | Resp 20 | Wt 166.4 lb

## 2022-04-27 DIAGNOSIS — E878 Other disorders of electrolyte and fluid balance, not elsewhere classified: Secondary | ICD-10-CM | POA: Insufficient documentation

## 2022-04-27 DIAGNOSIS — Z79899 Other long term (current) drug therapy: Secondary | ICD-10-CM | POA: Insufficient documentation

## 2022-04-27 DIAGNOSIS — D696 Thrombocytopenia, unspecified: Secondary | ICD-10-CM | POA: Diagnosis not present

## 2022-04-27 DIAGNOSIS — D72819 Decreased white blood cell count, unspecified: Secondary | ICD-10-CM | POA: Insufficient documentation

## 2022-04-27 DIAGNOSIS — Z7901 Long term (current) use of anticoagulants: Secondary | ICD-10-CM | POA: Diagnosis not present

## 2022-04-27 DIAGNOSIS — Z5112 Encounter for antineoplastic immunotherapy: Secondary | ICD-10-CM | POA: Diagnosis present

## 2022-04-27 DIAGNOSIS — Z86711 Personal history of pulmonary embolism: Secondary | ICD-10-CM | POA: Insufficient documentation

## 2022-04-27 DIAGNOSIS — M25561 Pain in right knee: Secondary | ICD-10-CM | POA: Diagnosis not present

## 2022-04-27 DIAGNOSIS — D509 Iron deficiency anemia, unspecified: Secondary | ICD-10-CM

## 2022-04-27 DIAGNOSIS — Z96651 Presence of right artificial knee joint: Secondary | ICD-10-CM | POA: Diagnosis not present

## 2022-04-27 DIAGNOSIS — G629 Polyneuropathy, unspecified: Secondary | ICD-10-CM | POA: Diagnosis not present

## 2022-04-27 DIAGNOSIS — Z7902 Long term (current) use of antithrombotics/antiplatelets: Secondary | ICD-10-CM | POA: Diagnosis not present

## 2022-04-27 DIAGNOSIS — C9000 Multiple myeloma not having achieved remission: Secondary | ICD-10-CM

## 2022-04-27 LAB — CBC WITH DIFFERENTIAL/PLATELET
Abs Immature Granulocytes: 0.01 10*3/uL (ref 0.00–0.07)
Basophils Absolute: 0.1 10*3/uL (ref 0.0–0.1)
Basophils Relative: 2 %
Eosinophils Absolute: 0.3 10*3/uL (ref 0.0–0.5)
Eosinophils Relative: 8 %
HCT: 33.3 % — ABNORMAL LOW (ref 36.0–46.0)
Hemoglobin: 10.8 g/dL — ABNORMAL LOW (ref 12.0–15.0)
Immature Granulocytes: 0 %
Lymphocytes Relative: 25 %
Lymphs Abs: 0.9 10*3/uL (ref 0.7–4.0)
MCH: 33.4 pg (ref 26.0–34.0)
MCHC: 32.4 g/dL (ref 30.0–36.0)
MCV: 103.1 fL — ABNORMAL HIGH (ref 80.0–100.0)
Monocytes Absolute: 0.2 10*3/uL (ref 0.1–1.0)
Monocytes Relative: 7 %
Neutro Abs: 2 10*3/uL (ref 1.7–7.7)
Neutrophils Relative %: 58 %
Platelets: 78 10*3/uL — ABNORMAL LOW (ref 150–400)
RBC: 3.23 MIL/uL — ABNORMAL LOW (ref 3.87–5.11)
RDW: 16.3 % — ABNORMAL HIGH (ref 11.5–15.5)
WBC: 3.5 10*3/uL — ABNORMAL LOW (ref 4.0–10.5)
nRBC: 0 % (ref 0.0–0.2)

## 2022-04-27 LAB — COMPREHENSIVE METABOLIC PANEL
ALT: 20 U/L (ref 0–44)
AST: 22 U/L (ref 15–41)
Albumin: 3.8 g/dL (ref 3.5–5.0)
Alkaline Phosphatase: 51 U/L (ref 38–126)
Anion gap: 7 (ref 5–15)
BUN: 18 mg/dL (ref 8–23)
CO2: 30 mmol/L (ref 22–32)
Calcium: 9.2 mg/dL (ref 8.9–10.3)
Chloride: 103 mmol/L (ref 98–111)
Creatinine, Ser: 0.74 mg/dL (ref 0.44–1.00)
GFR, Estimated: >60 mL/min (ref >60)
Glucose, Bld: 75 mg/dL (ref 70–99)
Potassium: 3.8 mmol/L (ref 3.5–5.1)
Sodium: 140 mmol/L (ref 135–145)
Total Bilirubin: 0.5 mg/dL (ref 0.3–1.2)
Total Protein: 6.2 g/dL — ABNORMAL LOW (ref 6.5–8.1)

## 2022-04-27 LAB — MAGNESIUM: Magnesium: 1.7 mg/dL (ref 1.7–2.4)

## 2022-04-27 MED ORDER — PROCHLORPERAZINE MALEATE 10 MG PO TABS
10.0000 mg | ORAL_TABLET | Freq: Once | ORAL | Status: AC
  Administered 2022-04-27: 10 mg via ORAL
  Filled 2022-04-27: qty 1

## 2022-04-27 MED ORDER — DENOSUMAB 120 MG/1.7ML ~~LOC~~ SOLN
120.0000 mg | Freq: Once | SUBCUTANEOUS | Status: AC
  Administered 2022-04-27: 120 mg via SUBCUTANEOUS
  Filled 2022-04-27: qty 1.7

## 2022-04-27 MED ORDER — BORTEZOMIB CHEMO SQ INJECTION 3.5 MG (2.5MG/ML)
1.0000 mg/m2 | Freq: Once | INTRAMUSCULAR | Status: AC
  Administered 2022-04-27: 1.75 mg via SUBCUTANEOUS
  Filled 2022-04-27: qty 0.7

## 2022-04-27 NOTE — Progress Notes (Signed)
Pt presents today for Velcade and Xgeva per provider's order. Vital signs and labs WNL for treatment. Okay to proceed with treatment today.

## 2022-04-27 NOTE — Patient Instructions (Signed)
[facility]  Discharge Instructions: Thank you for choosing [facility] to provide your oncology and hematology care.  If you have a lab appointment with the [facility], please come in thru the Main Entrance and check in at the main information desk.  Wear comfortable clothing and clothing appropriate for easy access to any Portacath or PICC line.   We strive to give you quality time with your provider. You may need to reschedule your appointment if you arrive late (15 or more minutes).  Arriving late affects you and other patients whose appointments are after yours.  Also, if you miss three or more appointments without notifying the office, you may be dismissed from the clinic at the provider's discretion.      For prescription refill requests, have your pharmacy contact our office and allow 72 hours for refills to be completed.    Today you received the following chemotherapy and/or immunotherapy agents Velcade and Xgeva, return as scheduled.   To help prevent nausea and vomiting after your treatment, we encourage you to take your nausea medication as directed.  BELOW ARE SYMPTOMS THAT SHOULD BE REPORTED IMMEDIATELY: *FEVER GREATER THAN 100.4 F (38 C) OR HIGHER *CHILLS OR SWEATING *NAUSEA AND VOMITING THAT IS NOT CONTROLLED WITH YOUR NAUSEA MEDICATION *UNUSUAL SHORTNESS OF BREATH *UNUSUAL BRUISING OR BLEEDING *URINARY PROBLEMS (pain or burning when urinating, or frequent urination) *BOWEL PROBLEMS (unusual diarrhea, constipation, pain near the anus) TENDERNESS IN MOUTH AND THROAT WITH OR WITHOUT PRESENCE OF ULCERS (sore throat, sores in mouth, or a toothache) UNUSUAL RASH, SWELLING OR PAIN  UNUSUAL VAGINAL DISCHARGE OR ITCHING   Items with * indicate a potential emergency and should be followed up as soon as possible or go to the Emergency Department if any problems should occur.  Please show the CHEMOTHERAPY ALERT CARD or IMMUNOTHERAPY ALERT CARD  at check-in to the Emergency Department and triage nurse.  Should you have questions after your visit or need to cancel or reschedule your appointment, please contact [facility] [phone]  and follow the prompts.  Office hours are 8:00 a.m. to 4:30 p.m. Monday - Friday. Please note that voicemails left after 4:00 p.m. may not be returned until the following business day.  We are closed weekends and major holidays. You have access to a nurse at all times for urgent questions. Please call the main number to the clinic [phone] and follow the prompts.  For any non-urgent questions, you may also contact your provider using MyChart. We now offer e-Visits for anyone 67 and older to request care online for non-urgent symptoms. For details visit [email or web address].   Also download the MyChart app! Go to the app store, search "MyChart", open the app, select [facility], and log in with your MyChart username and password.  Masks are optional in the cancer centers. If you would like for your care team to wear a mask while they are taking care of you, please let them know. You may have one support person who is at least 73 years old accompany you for your appointments.

## 2022-04-27 NOTE — Progress Notes (Signed)
Patient tolerated Velcade injection with no complaints voiced. Lab work reviewed. See MAR for details. Injection site clean and dry with no bruising or swelling noted. Patient stable during and after injection. Band aid applied.  Patient taking calcium as directed. Denied tooth, jaw, and leg pain. No recent or upcoming dental visits. Labs reviewed. Patient tolerated injection with no complaints voiced. See MAR for details. Patient stable during and after injection. Site clean and dry with no bruising or swelling noted. Band aid applied. Vss with discharge and left in satisfactory condition with no s/s of distress.

## 2022-04-28 LAB — KAPPA/LAMBDA LIGHT CHAINS
Kappa free light chain: 10.2 mg/L (ref 3.3–19.4)
Kappa, lambda light chain ratio: 1.4 (ref 0.26–1.65)
Lambda free light chains: 7.3 mg/L (ref 5.7–26.3)

## 2022-04-29 LAB — PROTEIN ELECTROPHORESIS, SERUM
A/G Ratio: 2.1 — ABNORMAL HIGH (ref 0.7–1.7)
Albumin ELP: 3.7 g/dL (ref 2.9–4.4)
Alpha-1-Globulin: 0.2 g/dL (ref 0.0–0.4)
Alpha-2-Globulin: 0.5 g/dL (ref 0.4–1.0)
Beta Globulin: 0.7 g/dL (ref 0.7–1.3)
Gamma Globulin: 0.4 g/dL (ref 0.4–1.8)
Globulin, Total: 1.8 g/dL — ABNORMAL LOW (ref 2.2–3.9)
Total Protein ELP: 5.5 g/dL — ABNORMAL LOW (ref 6.0–8.5)

## 2022-05-03 LAB — IMMUNOFIXATION ELECTROPHORESIS
IgA: 55 mg/dL — ABNORMAL LOW (ref 64–422)
IgG (Immunoglobin G), Serum: 420 mg/dL — ABNORMAL LOW (ref 586–1602)
IgM (Immunoglobulin M), Srm: 21 mg/dL — ABNORMAL LOW (ref 26–217)
Total Protein ELP: 5.4 g/dL — ABNORMAL LOW (ref 6.0–8.5)

## 2022-05-09 ENCOUNTER — Telehealth: Payer: Self-pay

## 2022-05-09 ENCOUNTER — Other Ambulatory Visit: Payer: Self-pay

## 2022-05-09 MED ORDER — LENALIDOMIDE 10 MG PO CAPS: 10.0000 mg | ORAL_CAPSULE | Freq: Every day | ORAL | 0 refills | Status: DC

## 2022-05-09 NOTE — Telephone Encounter (Signed)
Chart reviewed. Revlimid refilled per last office note with Dr. Katragadda.  

## 2022-05-09 NOTE — Telephone Encounter (Signed)
Refill request for lidocaine 2% viscous solution to be sent to [facility]. Pt was last seen on 02/02/2022.

## 2022-05-10 ENCOUNTER — Other Ambulatory Visit: Payer: Self-pay | Admitting: Family Medicine

## 2022-05-10 ENCOUNTER — Other Ambulatory Visit: Payer: Self-pay | Admitting: Hematology

## 2022-05-10 ENCOUNTER — Inpatient Hospital Stay (HOSPITAL_BASED_OUTPATIENT_CLINIC_OR_DEPARTMENT_OTHER): Payer: Medicare Other | Admitting: Hematology

## 2022-05-10 ENCOUNTER — Inpatient Hospital Stay: Payer: Medicare Other

## 2022-05-10 VITALS — BP 131/76 | HR 85 | Temp 97.6°F | Resp 16 | Wt 151.1 lb

## 2022-05-10 DIAGNOSIS — C9000 Multiple myeloma not having achieved remission: Secondary | ICD-10-CM | POA: Diagnosis not present

## 2022-05-10 DIAGNOSIS — M62838 Other muscle spasm: Secondary | ICD-10-CM

## 2022-05-10 LAB — COMPREHENSIVE METABOLIC PANEL
ALT: 21 U/L (ref 0–44)
AST: 24 U/L (ref 15–41)
Albumin: 3.9 g/dL (ref 3.5–5.0)
Alkaline Phosphatase: 47 U/L (ref 38–126)
Anion gap: 6 (ref 5–15)
BUN: 14 mg/dL (ref 8–23)
CO2: 30 mmol/L (ref 22–32)
Calcium: 8.9 mg/dL (ref 8.9–10.3)
Chloride: 107 mmol/L (ref 98–111)
Creatinine, Ser: 0.8 mg/dL (ref 0.44–1.00)
GFR, Estimated: >60 mL/min (ref >60)
Glucose, Bld: 104 mg/dL — ABNORMAL HIGH (ref 70–99)
Potassium: 3.8 mmol/L (ref 3.5–5.1)
Sodium: 143 mmol/L (ref 135–145)
Total Bilirubin: 0.6 mg/dL (ref 0.3–1.2)
Total Protein: 6.4 g/dL — ABNORMAL LOW (ref 6.5–8.1)

## 2022-05-10 LAB — CBC WITH DIFFERENTIAL/PLATELET
Abs Immature Granulocytes: 0.01 10*3/uL (ref 0.00–0.07)
Basophils Absolute: 0 10*3/uL (ref 0.0–0.1)
Basophils Relative: 1 %
Eosinophils Absolute: 0.4 10*3/uL (ref 0.0–0.5)
Eosinophils Relative: 11 %
HCT: 34.8 % — ABNORMAL LOW (ref 36.0–46.0)
Hemoglobin: 11.2 g/dL — ABNORMAL LOW (ref 12.0–15.0)
Immature Granulocytes: 0 %
Lymphocytes Relative: 32 %
Lymphs Abs: 1.1 10*3/uL (ref 0.7–4.0)
MCH: 33.2 pg (ref 26.0–34.0)
MCHC: 32.2 g/dL (ref 30.0–36.0)
MCV: 103.3 fL — ABNORMAL HIGH (ref 80.0–100.0)
Monocytes Absolute: 0.3 10*3/uL (ref 0.1–1.0)
Monocytes Relative: 10 %
Neutro Abs: 1.6 10*3/uL — ABNORMAL LOW (ref 1.7–7.7)
Neutrophils Relative %: 46 %
Platelets: 91 10*3/uL — ABNORMAL LOW (ref 150–400)
RBC: 3.37 MIL/uL — ABNORMAL LOW (ref 3.87–5.11)
RDW: 15.9 % — ABNORMAL HIGH (ref 11.5–15.5)
WBC: 3.5 10*3/uL — ABNORMAL LOW (ref 4.0–10.5)
nRBC: 0 % (ref 0.0–0.2)

## 2022-05-10 LAB — MAGNESIUM: Magnesium: 1.9 mg/dL (ref 1.7–2.4)

## 2022-05-10 MED ORDER — BORTEZOMIB CHEMO SQ INJECTION 3.5 MG (2.5MG/ML)
1.0000 mg/m2 | Freq: Once | INTRAMUSCULAR | Status: AC
  Administered 2022-05-10: 1.75 mg via SUBCUTANEOUS
  Filled 2022-05-10: qty 0.7

## 2022-05-10 MED ORDER — DEXAMETHASONE 4 MG PO TABS
20.0000 mg | ORAL_TABLET | Freq: Once | ORAL | Status: AC
  Administered 2022-05-10: 20 mg via ORAL
  Filled 2022-05-10: qty 5

## 2022-05-10 MED ORDER — PROCHLORPERAZINE MALEATE 10 MG PO TABS
10.0000 mg | ORAL_TABLET | Freq: Four times a day (QID) | ORAL | Status: DC | PRN
  Administered 2022-05-10: 10 mg via ORAL
  Filled 2022-05-10: qty 1

## 2022-05-10 MED ORDER — LIDOCAINE VISCOUS HCL 2 % MT SOLN: OROMUCOSAL | 0 refills | Status: DC

## 2022-05-10 NOTE — Addendum Note (Signed)
Addended by: Mahala Menghini on: 05/10/2022 03:48 PM   Modules accepted: Orders

## 2022-05-10 NOTE — Progress Notes (Signed)
Patient presents today for Velcade injection and follow up with Dr. Delton Coombes. Message received to proceed with today's treatment. Vital signs within parameters for treatment. Labs within parameters for treatment.   Patient states she is taking Revlimid as prescribed with no side effects noted at this time.   Treatment given today per MD orders. Tolerated without adverse affects. Vital signs stable. No complaints at this time. Discharged from clinic ambulatory in stable condition. Alert and oriented x 3. F/U with [facility] as scheduled.

## 2022-05-10 NOTE — Progress Notes (Signed)
Patient is taking Revlimid as prescribed.  She has not missed any doses and reports no side effects at this time.    Patient has been examined by Dr. Katragadda, and vital signs and labs have been reviewed. ANC, Creatinine, LFTs, hemoglobin, and platelets are within treatment parameters per M.D. - pt may proceed with treatment.  Primary RN and pharmacy notified.  

## 2022-05-10 NOTE — Progress Notes (Signed)
[address] [place], [place] [address]   CLINIC:  Medical Oncology/Hematology  PCP:  Alvira Monday, [address] / [place] [address] [phone]   REASON FOR VISIT:  Follow-up for multiple myeloma  PRIOR THERAPY: Dara VRD 6 cycles completed on 11/30/2021  NGS Results: not done  CURRENT THERAPY: Maintenance Velcade every 2 weeks, and Revlimid 3 weeks on/1 week off  BRIEF ONCOLOGIC HISTORY:  Oncology History  IgA myeloma ([facility])  05/06/2021 Initial Diagnosis   IgA myeloma ([place])   08/10/2021 - 01/19/2022 Chemotherapy   Patient is on Treatment Plan : MYELOMA NEWLY DIAGNOSED TRANSPLANT CANDIDATE DaraVRd (Daratumumab SQ) q21d x 6 Cycles (Induction/Consolidation)     08/10/2021 -  Chemotherapy   Patient is on Treatment Plan : MYELOMA NEWLY DIAGNOSED TRANSPLANT CANDIDATE DaraVRd (Daratumumab SQ) q21d x 6 Cycles (Induction/Consolidation)       CANCER STAGING:  Cancer Staging  No matching staging information was found for the patient.  INTERVAL HISTORY:  [name], a 71 y.o. female, seen for follow-up of multiple myeloma and toxicity assessment prior to next treatment with Velcade.  She reported that she had sternal pain few days ago which subsided.  She reported worsening right knee pain.  She has had right knee replacement in the past by Dr. Chauncey Reading.  Tramadol makes her sleepy.  Hence she is taking hydrocodone every 12 hours as needed.  Numbness in the feet has been stable.  REVIEW OF SYSTEMS:  Review of Systems  Constitutional:  Negative for appetite change and fatigue.  Musculoskeletal:  Negative for arthralgias.  Neurological:  Positive for numbness (Left more than right foot stable). Negative for dizziness.  Psychiatric/Behavioral:  Negative for sleep disturbance.   All other systems reviewed and are negative.   PAST MEDICAL/SURGICAL HISTORY:  Past Medical History:  Diagnosis Date   Acute myocardial infarction [facility])  2009   CAD/no stent medically managed   Anxiety disorder    CAD (coronary artery disease)    Cancer (HCC)    multiple myeloma   Hyperlipidemia    Hypertension    IBS (irritable bowel syndrome)    Non-ulcer dyspepsia 04/30/2009   Qualifier: Diagnosis of  By: Oneida Alar MD, Sandi L    Overactive bladder    PE (pulmonary embolism) 10/2012   left lung   Presence of permanent cardiac pacemaker    Sleep apnea    Past Surgical History:  Procedure Laterality Date   ABDOMINAL HYSTERECTOMY     BSO secondary to cyst     CARDIAC CATHETERIZATION     COLONOSCOPY  12/2009   Dr. West Carbo, propofol, normal. Next TCS 12/2019   COLONOSCOPY N/A 05/22/2017   Examined portion ileum was normal, significant looping of colon, external hemorrhoids and rectal bleeding due to internal hemorrhoids, mild diverticulosis procedure: COLONOSCOPY;  Surgeon: Danie Binder, MD;  [facility];  Service: Endoscopy;  Laterality: N/A;  10:30am   ESOPHAGOGASTRODUODENOSCOPY  05/11/2009   schatzki ring/small hiatal hernia/path:gastritis   ESOPHAGOGASTRODUODENOSCOPY N/A 05/22/2017   Multiple gastric polyps with biopsy benign fundic gland polyp, small bowel biopsy negative for celiac, gastric biopsy with mild gastritis but no H. pylori procedure: ESOPHAGOGASTRODUODENOSCOPY (EGD);  Surgeon: Danie Binder, MD;  Location: [facility];  Service: Endoscopy;  Laterality: N/A;   ESOPHAGOGASTRODUODENOSCOPY (EGD) WITH ESOPHAGEAL DILATION N/A 04/01/2013   [id number] at the gastroesophageal juction/multiple small polyps/mild gastritis   GIVENS CAPSULE STUDY N/A 06/07/2017   Frequent gastric erosions,  normal small bowel mucosa.  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    SOCIAL HISTORY:  Social History    Socioeconomic History   Marital status: Married    Spouse name: Games developer   Number of children: 3   Years of education: 16   Highest education level: Not on file  Occupational History   Not on file  Tobacco Use   Smoking status: Never    Passive exposure: Never   Smokeless tobacco: Never   Tobacco comments:    Never smoked   Vaping Use   Vaping Use: Never used  Substance and Sexual Activity   Alcohol use: Not Currently    Comment: occ wine   Drug use: No   Sexual activity: Not Currently  Other Topics Concern   Not on file  Social History Narrative      Lives with husband-51 years 32 in Aug 2021   Daughter is close by, 2 sons live further away   One IN [place],ONE IN [place], [place].        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   Enjoys: reading, young adult      Diet: eats all food groups    Caffeine: coffee 1, tea daily soda-1 daily   Water: 1-2 cups      Wears seat belt    Does not use phone while driving   Smoke detectors at home    Public house manager -locked up      Left handed   One story home   Drinks caffeine   Social Determinants of Health   Financial Resource Strain: Low Risk  (11/15/2021)   Overall Financial Resource Strain (CARDIA)    Difficulty of Paying Living Expenses: Not hard at all  Food Insecurity: No Food Insecurity (11/15/2021)   Hunger Vital Sign    Worried About Running Out of Food in the Last Year: Never true    Ran Out of Food in the Last Year: Never true  Transportation Needs: No Transportation Needs (11/15/2021)   PRAPARE - Hydrologist (Medical): No    Lack of Transportation (Non-Medical): No  Physical Activity: Inactive (11/26/2021)   Exercise Vital Sign    Days of Exercise per Week: 0 days    Minutes of Exercise per Session: 0 min  Stress: Stress Concern Present (11/26/2021)   [place]    Feeling of Stress  : To some extent  Social Connections: Moderately Integrated (11/11/2020)   Social Connection and Isolation Panel [NHANES]    Frequency of Communication with Friends and Family: Once a week    Frequency of Social Gatherings with Friends and Family: Once a week    Attends Religious Services: More than 4 times per year    Active Member of [employer] or Organizations: Yes    Attends Archivist Meetings: More than 4 times per year    Marital Status: Married  Human resources officer Violence: Not At Risk (11/11/2020)   Humiliation, Afraid, Rape, and Kick questionnaire    Fear of Current or Ex-Partner: No    Emotionally Abused: No    Physically Abused: No    Sexually Abused: No  FAMILY HISTORY:  Family History  Problem Relation Age of Onset   Colon polyps Neg Hx    Colon cancer Neg Hx     CURRENT MEDICATIONS:  Current Outpatient Medications  Medication Sig Dispense Refill   acetaminophen (TYLENOL) 500 MG tablet Take 1,000 mg by mouth 2 (two) times daily as needed for moderate pain or headache.     acyclovir (ZOVIRAX) 400 MG tablet TAKE 1 TABLET BY MOUTH TWICE DAILY 60 tablet 6   albuterol (PROVENTIL HFA;VENTOLIN HFA) 108 (90 BASE) MCG/ACT inhaler Inhale 2 puffs into the lungs every 6 (six) hours as needed for shortness of breath.     ascorbic acid (VITAMIN C) 500 MG tablet Take 500 mg by mouth daily.     aspirin EC 81 MG tablet Take 81 mg by mouth at bedtime.     B Complex Vitamins (VITAMIN B COMPLEX) TABS Take 1 tablet by mouth daily.     bortezomib SQ (VELCADE) 3.5 MG injection Inject into the skin once.     busPIRone (BUSPAR) 5 MG tablet Take 1 tablet (5 mg total) by mouth daily. 30 tablet 3   Calcium Carbonate-Vitamin D (CALCIUM 600+D PO) Take 1 tablet by mouth 2 (two) times daily.     cyanocobalamin 1000 MCG tablet Take 1 tablet by mouth daily.     cyclobenzaprine (FLEXERIL) 10 MG tablet TAKE 1/2 TABLET BY MOUTH TWICE DAILY AS NEEDED FOR MUSCLE SPASMS. 20 tablet 0    Daratumumab-Hyaluronidase-fihj (DARZALEX FASPRO [place]) Inject into the skin.     diclofenac Sodium (VOLTAREN) 1 % GEL Apply 4 g topically 4 (four) times daily. 50 g 0   doxycycline (VIBRAMYCIN) 100 MG capsule Take 100 mg by mouth daily.     fluticasone (FLONASE) 50 MCG/ACT nasal spray USE 2 SPRAYS IN EACH NOSTRIL ONCE DAILY. 16 g 0   folic acid (FOLVITE) 1 MG tablet TAKE 1 TABLET BY MOUTH ONCE DAILY. 30 tablet 11   gabapentin (NEURONTIN) 100 MG capsule Take 100 mg by mouth 3 (three) times daily.     gabapentin (NEURONTIN) 600 MG tablet Take by mouth.     hydrochlorothiazide (MICROZIDE) 12.5 MG capsule Take 12.5 mg by mouth daily.     HYDROcodone-acetaminophen (NORCO/VICODIN) 5-325 MG tablet Take 1 tablet by mouth every 8 (eight) hours as needed for moderate pain. 60 tablet 0   hydrOXYzine (VISTARIL) 25 MG capsule      Hyoscyamine Sulfate SL 0.125 MG SUBL      lenalidomide (REVLIMID) 10 MG capsule Take 1 capsule (10 mg total) by mouth daily. 21 days on, 7 days off every 28 days 21 capsule 0   lidocaine (HM LIDOCAINE PATCH) 4 % Place 1 patch onto the skin daily. 14 patch 0   Lidocaine HCl (XOLIDO) 2 % CREA Apply 1 application topically daily as needed (arthritisis).     loperamide (IMODIUM) 2 MG capsule Take 4 mg by mouth as needed. Take 2 tablets after the first loose stool and then 1 tablet after each loose stool.  Do not exceed 8 capsules within 24 hours.     losartan (COZAAR) 100 MG tablet Take 1 tablet (100 mg total) by mouth daily. 30 tablet 3   magnesium oxide (MAG-OX) 400 (240 Mg) MG tablet Take 1 tablet (400 mg total) by mouth 2 (two) times daily. 60 tablet 4   metoprolol tartrate (LOPRESSOR) 50 MG tablet Take 1 tablet (50 mg total) by mouth 2 (two) times daily. (Patient taking differently: Take 25 mg by mouth  2 (two) times daily.) 180 tablet 1   mirtazapine (REMERON) 15 MG tablet Take 15 mg by mouth at bedtime.     montelukast (SINGULAIR) 10 MG tablet Take 10 mg by mouth daily.       MYRBETRIQ 25 MG TB24 tablet TAKE ONE TABLET BY MOUTH ONCE DAILY 30 tablet 0   nitroGLYCERIN (NITROSTAT) 0.4 MG SL tablet Place 0.4 mg under the tongue every 5 (five) minutes as needed for chest pain.     pantoprazole (PROTONIX) 40 MG tablet Take 40 mg by mouth daily.     potassium chloride SA (KLOR-CON M) 20 MEQ tablet Take 1 tablet (20 mEq total) by mouth 3 (three) times daily. 21 tablet 0   primidone (MYSOLINE) 50 MG tablet Take 75 mg by mouth daily.     Probiotic Product (GNP PROBIOTIC COLON SUPPORT) CAPS Take 1 capsule by mouth daily.     sertraline (ZOLOFT) 100 MG tablet Take 200 mg by mouth daily.      simvastatin (ZOCOR) 40 MG tablet Take 40 mg by mouth at bedtime.     traMADol (ULTRAM) 50 MG tablet Take 50 mg by mouth 2 (two) times daily.     warfarin (COUMADIN) 3 MG tablet Take 3 mg by mouth daily.     lidocaine (XYLOCAINE) 2 % solution Take 2 teaspoons before meals and at bedtime as needed. May repeat every 4 hours for a maximum dose of 8 per day. 300 mL 0   No current facility-administered medications for this visit.   Facility-Administered Medications Ordered in Other Visits  Medication Dose Route Frequency Provider Last Rate Last Admin   prochlorperazine (COMPAZINE) tablet 10 mg  10 mg Oral Q6H PRN Derek Jack, MD   10 mg at 05/10/22 1409    ALLERGIES:  Allergies  Allergen Reactions   Amoxicillin-Pot Clavulanate Other (See Comments)   Biaxin [Clarithromycin] Other (See Comments)    Stomach problems    Erythromycin    Lisinopril Swelling    PHYSICAL EXAM:  Performance status (ECOG): 1 - Symptomatic but completely ambulatory  Vitals:   05/10/22 1250  BP: 131/76  Pulse: 85  Resp: 16  Temp: 97.6 F (36.4 C)  SpO2: 99%   Wt Readings from Last 3 Encounters:  05/10/22 151 lb 1.6 oz (68.5 kg)  04/27/22 166 lb 6.4 oz (75.5 kg)  03/29/22 162 lb 9.6 oz (73.8 kg)   Physical Exam Vitals reviewed.  Constitutional:      Appearance: Normal appearance.   Cardiovascular:     Rate and Rhythm: Normal rate and regular rhythm.     Pulses: Normal pulses.     Heart sounds: Normal heart sounds.  Pulmonary:     Effort: Pulmonary effort is normal.     Breath sounds: Normal breath sounds.  Neurological:     General: No focal deficit present.     Mental Status: She is alert and oriented to person, place, and time.  Psychiatric:        Mood and Affect: Mood normal.        Behavior: Behavior normal.     LABORATORY DATA:  I have reviewed the labs as listed.     Latest Ref Rng & Units 05/10/2022   12:11 PM 04/27/2022   12:28 PM 04/13/2022   10:43 AM  CBC  WBC 4.0 - 10.5 K/uL 3.5  3.5  3.4   Hemoglobin 12.0 - 15.0 g/dL 11.2  10.8  11.8   Hematocrit 36.0 - 46.0 % 34.8  33.3  35.1   Platelets 150 - 400 K/uL 91  78  81       Latest Ref Rng & Units 05/10/2022   12:11 PM 04/27/2022   12:28 PM 04/13/2022   10:43 AM  CMP  Glucose 70 - 99 mg/dL 104  75  77   BUN 8 - 23 mg/dL _0 Creatinine 0.44 - 1.00 mg/dL 0.80  0.74  0.85   Sodium 135 - 145 mmol/L 143  140  141   Potassium 3.5 - 5.1 mmol/L 3.8  3.8  3.9   Chloride 98 - 111 mmol/L 107  103  107   CO2 22 - 32 mmol/L _1 Calcium 8.9 - 10.3 mg/dL 8.9  9.2  7.7   Total Protein 6.5 - 8.1 g/dL 6.4  6.2  6.4   Total Bilirubin 0.3 - 1.2 mg/dL 0.6  0.5  0.5   Alkaline Phos 38 - 126 U/L 47  51  59   AST 15 - 41 U/L _2 ALT 0 - 44 U/L _3 DIAGNOSTIC IMAGING:  I have independently reviewed the scans and discussed with the patient. No results found.   ASSESSMENT:  1.  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  Increased number of atypical plasma cells present 36% of all cell lines.  Plasma cells are kappa light chain restricted. -Unfortunately her specimen has reached cytogenetics and FISH lab week later due to bad weather and no viable plasma cells. -PET scan on  07/09/2019 showed no findings of active myeloma. -24-hour urine shows total protein 59 mg.  Urine immunofixation shows IgA kappa monoclonal protein.  LDH is 184. -Based on [facility] criteria she has high risk with about 45-50% probability of progression to myeloma in the next 2 years. -CTAP on 01/07/2020 shows acute superior endplate burst fracture of L1.  Mild associated paravertebral soft tissue thickening.  No other bone abnormalities. -Bone density test on 02/07/2020 shows T score -0.5. - Bone marrow biopsy on 07/12/2021: Hypercellular with increased number of atypical plasma cells representing 30% of cells, displaying kappa light chain restriction. - FISH panel: Gain of 1 q. (high risk), T p53 deletion (high risk), monosomy 13 (standard risk) - Cytogenetics: Complex karyotype. - PET scan (06/24/2021): New hypermetabolic bone lesions involving left anterior iliac crest, left mid humerus, right superior pubic ramus, bilateral mid femurs, left calvarium.  Largest lytic lesion in the left iliac crest measuring 2.9 x 2.3 cm. - 2D echo on 07/29/2021: EF 50-55%.  There is distal septal and inferior apical hypokinesis.  (Carfilzomib based regimen was put on hold due to echo findings) - Dara VRD 6 cycles from 08/10/2021 through 11/30/2021 - Myeloma panel 12/07/2021, DIRA test was negative, M spike 0.2 g and light chain ratio normal. - Maintenance Velcade every 2 weeks and Revlimid 3 weeks on 1 week off Started on 12/20/2021  2.  Social/family history: - She uses walker to ambulate since her right knee replacement leading to stiffness.  She lives with older sister and husband who has dementia. - She is independent of ADLs and IADLs.  She also drives.   PLAN:  1.  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 - Reviewed myeloma labs from  04/27/2022.  M spike was not detected.  Free light chain ratio is normal at 1.4 with kappa light chains 10.2.   Immunofixation was normal. - Reviewed labs from today which showed normal LFTs, creatinine and calcium.  CBC shows mild leukopenia and thrombocytopenia with ANC of 1.6.  Hemoglobin is 11.2. - Continue Velcade every 2 weeks.  Continue Revlimid 10 mg 3 weeks on 1 week off.  RTC 8 weeks for follow-up with repeat myeloma labs.   2.  Electrolyte abnormalities: - Continue magnesium and potassium supplements.  Both levels are normal.   3.  Pulmonary embolism (2015): - Continue Coumadin indefinitely.  No bleeding issues.  4.  Hypotension: - Continue metoprolol and Cozaar.  Blood pressure today is 130/76.  5.  ID prophylaxis: - Continue acyclovir 400 mg twice daily.  6.  Myeloma bone disease: - Calcium is 8.9.  Continue denosumab today and monthly.  7.  Peripheral neuropathy: - Numbness in the fingertips and toes is stable.  No pharmacological intervention.  8.  Right knee pain: - Tramadol makes her sleepy.  Continue hydrocodone 5/325 every 12 hours.   Orders placed this encounter:  Orders Placed This Encounter  Procedures   CBC with Differential   Comprehensive metabolic panel   CBC with Differential   Comprehensive metabolic panel   Magnesium   Kappa/lambda light chains   Immunofixation electrophoresis   Protein electrophoresis, serum      Derek Jack, MD [place] [phone]

## 2022-05-10 NOTE — Patient Instructions (Signed)
[facility]  Discharge Instructions: Thank you for choosing [facility] to provide your oncology and hematology care.  If you have a lab appointment with the [facility], please come in thru the Main Entrance and check in at the main information desk.  Wear comfortable clothing and clothing appropriate for easy access to any Portacath or PICC line.   We strive to give you quality time with your provider. You may need to reschedule your appointment if you arrive late (15 or more minutes).  Arriving late affects you and other patients whose appointments are after yours.  Also, if you miss three or more appointments without notifying the office, you may be dismissed from the clinic at the provider's discretion.      For prescription refill requests, have your pharmacy contact our office and allow 72 hours for refills to be completed.    Today you received the following chemotherapy and/or immunotherapy agents Velcade injection. Bortezomib Injection What is this medication? BORTEZOMIB (bor TEZ oh mib) treats lymphoma. It may also be used to treat multiple myeloma, a type of bone marrow cancer. It works by blocking a protein that causes cancer cells to grow and multiply. This helps to slow or stop the spread of cancer cells. This medicine may be used for other purposes; ask your health care provider or pharmacist if you have questions. COMMON BRAND NAME(S): Velcade What should I tell my care team before I take this medication? They need to know if you have any of these conditions: Dehydration Diabetes Heart disease Liver disease Tingling of the fingers or toes or other nerve disorder An unusual or allergic reaction to bortezomib, other medications, foods, dyes, or preservatives If you or your partner are pregnant or trying to get pregnant Breastfeeding How should I use this medication? This medication is injected into a vein or under the skin. It is given  by your care team in a hospital or clinic setting. Talk to your care team about the use of this medication in children. Special care may be needed. Overdosage: If you think you have taken too much of this medicine contact a poison control center or emergency room at once. NOTE: This medicine is only for you. Do not share this medicine with others. What if I miss a dose? Keep appointments for follow-up doses. It is important not to miss your dose. Call your care team if you are unable to keep an appointment. What may interact with this medication? Ketoconazole Rifampin This list may not describe all possible interactions. Give your health care provider a list of all the medicines, herbs, non-prescription drugs, or dietary supplements you use. Also tell them if you smoke, drink alcohol, or use illegal drugs. Some items may interact with your medicine. What should I watch for while using this medication? Your condition will be monitored carefully while you are receiving this medication. You may need blood work while taking this medication. This medication may affect your coordination, reaction time, or judgment. Do not drive or operate machinery until you know how this medication affects you. Sit up or stand slowly to reduce the risk of dizzy or fainting spells. Drinking alcohol with this medication can increase the risk of these side effects. This medication may increase your risk of getting an infection. Call your care team for advice if you get a fever, chills, sore throat, or other symptoms of a cold or flu. Do not treat yourself. Try to avoid being around  people who are sick. Check with your care team if you have severe diarrhea, nausea, and vomiting, or if you sweat a lot. The loss of too much body fluid may make it dangerous for you to take this medication. Talk to your care team if you may be pregnant. Serious birth defects can occur if you take this medication during pregnancy and for 7 months  after the last dose. You will need a negative pregnancy test before starting this medication. Contraception is recommended while taking this medication and for 7 months after the last dose. Your care team can help you find the option that works for you. If your partner can get pregnant, use a condom during sex while taking this medication and for 4 months after the last dose. Do not breastfeed while taking this medication and for 2 months after the last dose. This medication may cause infertility. Talk to your care team if you are concerned about your fertility. What side effects may I notice from receiving this medication? Side effects that you should report to your care team as soon as possible: Allergic reactions--skin rash, itching, hives, swelling of the face, lips, tongue, or throat Bleeding--bloody or black, tar-like stools, vomiting blood or brown material that looks like coffee grounds, red or dark brown urine, small red or purple spots on skin, unusual bruising or bleeding Bleeding in the brain--severe headache, stiff neck, confusion, dizziness, change in vision, numbness or weakness of the face, arm, or leg, trouble speaking, trouble walking, vomiting Bowel blockage--stomach cramping, unable to have a bowel movement or pass gas, loss of appetite, vomiting Heart failure--shortness of breath, swelling of the ankles, feet, or hands, sudden weight gain, unusual weakness or fatigue Infection--fever, chills, cough, sore throat, wounds that don't heal, pain or trouble when passing urine, general feeling of discomfort or being unwell Liver injury--right upper belly pain, loss of appetite, nausea, light-colored stool, dark yellow or brown urine, yellowing skin or eyes, unusual weakness or fatigue Low blood pressure--dizziness, feeling faint or lightheaded, blurry vision Lung injury--shortness of breath or trouble breathing, cough, spitting up blood, chest pain, fever Pain, tingling, or numbness in  the hands or feet Severe or prolonged diarrhea Stomach pain, bloody diarrhea, pale skin, unusual weakness or fatigue, decrease in the amount of urine, which may be signs of hemolytic uremic syndrome Sudden and severe headache, confusion, change in vision, seizures, which may be signs of posterior reversible encephalopathy syndrome (PRES) TTP--purple spots on the skin or inside the mouth, pale skin, yellowing skin or eyes, unusual weakness or fatigue, fever, fast or irregular heartbeat, confusion, change in vision, trouble speaking, trouble walking Tumor lysis syndrome (TLS)--nausea, vomiting, diarrhea, decrease in the amount of urine, dark urine, unusual weakness or fatigue, confusion, muscle pain or cramps, fast or irregular heartbeat, joint pain Side effects that usually do not require medical attention (report to your care team if they continue or are bothersome): Constipation Diarrhea Fatigue Loss of appetite Nausea This list may not describe all possible side effects. Call your doctor for medical advice about side effects. You may report side effects to FDA at 1-800-FDA-1088. Where should I keep my medication? This medication is given in a hospital or clinic. It will not be stored at home. NOTE: This sheet is a summary. It may not cover all possible information. If you have questions about this medicine, talk to your doctor, pharmacist, or health care provider.  2023 Elsevier/Gold Standard (2021-10-06 00:00:00)       To help prevent  nausea and vomiting after your treatment, we encourage you to take your nausea medication as directed.  BELOW ARE SYMPTOMS THAT SHOULD BE REPORTED IMMEDIATELY: *FEVER GREATER THAN 100.4 F (38 C) OR HIGHER *CHILLS OR SWEATING *NAUSEA AND VOMITING THAT IS NOT CONTROLLED WITH YOUR NAUSEA MEDICATION *UNUSUAL SHORTNESS OF BREATH *UNUSUAL BRUISING OR BLEEDING *URINARY PROBLEMS (pain or burning when urinating, or frequent urination) *BOWEL PROBLEMS (unusual  diarrhea, constipation, pain near the anus) TENDERNESS IN MOUTH AND THROAT WITH OR WITHOUT PRESENCE OF ULCERS (sore throat, sores in mouth, or a toothache) UNUSUAL RASH, SWELLING OR PAIN  UNUSUAL VAGINAL DISCHARGE OR ITCHING   Items with * indicate a potential emergency and should be followed up as soon as possible or go to the Emergency Department if any problems should occur.  Please show the CHEMOTHERAPY ALERT CARD or IMMUNOTHERAPY ALERT CARD at check-in to the Emergency Department and triage nurse.  Should you have questions after your visit or need to cancel or reschedule your appointment, please contact [facility] [phone]  and follow the prompts.  Office hours are 8:00 a.m. to 4:30 p.m. Monday - Friday. Please note that voicemails left after 4:00 p.m. may not be returned until the following business day.  We are closed weekends and major holidays. You have access to a nurse at all times for urgent questions. Please call the main number to the clinic [phone] and follow the prompts.  For any non-urgent questions, you may also contact your provider using MyChart. We now offer e-Visits for anyone 23 and older to request care online for non-urgent symptoms. For details visit [email or web address].   Also download the MyChart app! Go to the app store, search "MyChart", open the app, select [facility], and log in with your MyChart username and password.  Masks are optional in the cancer centers. If you would like for your care team to wear a mask while they are taking care of you, please let them know. You may have one support person who is at least 73 years old accompany you for your appointments.

## 2022-05-10 NOTE — Patient Instructions (Addendum)
[facility] at [facility] Discharge Instructions   You were seen and examined today by Dr. Delton Coombes.  He reviewed the results of your lab work which are normal/stable. Your myeloma labs from 2 weeks ago look good.   We will proceed with your treatment today.   Return as scheduled.    Thank you for choosing [facility] at [facility] to provide your oncology and hematology care.  To afford each patient quality time with our provider, please arrive at least 15 minutes before your scheduled appointment time.   If you have a lab appointment with the [facility] please come in thru the Main Entrance and check in at the main information desk.  You need to re-schedule your appointment should you arrive 10 or more minutes late.  We strive to give you quality time with our providers, and arriving late affects you and other patients whose appointments are after yours.  Also, if you no show three or more times for appointments you may be dismissed from the clinic at the providers discretion.     Again, thank you for choosing [facility].  Our hope is that these requests will decrease the amount of time that you wait before being seen by our physicians.       _____________________________________________________________  Should you have questions after your visit to [facility], please contact our office at [phone] and follow the prompts.  Our office hours are 8:00 a.m. and 4:30 p.m. Monday - Friday.  Please note that voicemails left after 4:00 p.m. may not be returned until the following business day.  We are closed weekends and major holidays.  You do have access to a nurse 24-7, just call the main number to the clinic [phone] and do not press any options, hold on the line and a nurse will answer the phone.    For prescription refill requests, have your pharmacy contact our office and allow 72 hours.    Due to  Covid, you will need to wear a mask upon entering the hospital. If you do not have a mask, a mask will be given to you at the Main Entrance upon arrival. For doctor visits, patients may have 1 support person age 49 or older with them. For treatment visits, patients can not have anyone with them due to social distancing guidelines and our immunocompromised population.

## 2022-05-25 ENCOUNTER — Other Ambulatory Visit: Payer: Medicare Other

## 2022-05-25 ENCOUNTER — Inpatient Hospital Stay: Payer: Medicare Other | Attending: Hematology

## 2022-05-25 ENCOUNTER — Ambulatory Visit: Payer: Medicare Other

## 2022-05-25 ENCOUNTER — Inpatient Hospital Stay: Payer: Medicare Other

## 2022-05-25 VITALS — BP 125/72 | HR 85 | Temp 96.9°F | Resp 18 | Wt 164.5 lb

## 2022-05-25 DIAGNOSIS — C9000 Multiple myeloma not having achieved remission: Secondary | ICD-10-CM

## 2022-05-25 DIAGNOSIS — D509 Iron deficiency anemia, unspecified: Secondary | ICD-10-CM

## 2022-05-25 DIAGNOSIS — Z5112 Encounter for antineoplastic immunotherapy: Secondary | ICD-10-CM | POA: Diagnosis not present

## 2022-05-25 LAB — COMPREHENSIVE METABOLIC PANEL
ALT: 18 U/L (ref 0–44)
AST: 21 U/L (ref 15–41)
Albumin: 3.8 g/dL (ref 3.5–5.0)
Alkaline Phosphatase: 54 U/L (ref 38–126)
Anion gap: 9 (ref 5–15)
BUN: 18 mg/dL (ref 8–23)
CO2: 26 mmol/L (ref 22–32)
Calcium: 8.4 mg/dL — ABNORMAL LOW (ref 8.9–10.3)
Chloride: 102 mmol/L (ref 98–111)
Creatinine, Ser: 0.82 mg/dL (ref 0.44–1.00)
GFR, Estimated: >60 mL/min (ref >60)
Glucose, Bld: 83 mg/dL (ref 70–99)
Potassium: 3.4 mmol/L — ABNORMAL LOW (ref 3.5–5.1)
Sodium: 137 mmol/L (ref 135–145)
Total Bilirubin: 0.5 mg/dL (ref 0.3–1.2)
Total Protein: 6.1 g/dL — ABNORMAL LOW (ref 6.5–8.1)

## 2022-05-25 LAB — CBC WITH DIFFERENTIAL/PLATELET
Abs Immature Granulocytes: 0.02 10*3/uL (ref 0.00–0.07)
Basophils Absolute: 0.1 10*3/uL (ref 0.0–0.1)
Basophils Relative: 2 %
Eosinophils Absolute: 0.3 10*3/uL (ref 0.0–0.5)
Eosinophils Relative: 8 %
HCT: 34.2 % — ABNORMAL LOW (ref 36.0–46.0)
Hemoglobin: 11.2 g/dL — ABNORMAL LOW (ref 12.0–15.0)
Immature Granulocytes: 1 %
Lymphocytes Relative: 27 %
Lymphs Abs: 1 10*3/uL (ref 0.7–4.0)
MCH: 33.7 pg (ref 26.0–34.0)
MCHC: 32.7 g/dL (ref 30.0–36.0)
MCV: 103 fL — ABNORMAL HIGH (ref 80.0–100.0)
Monocytes Absolute: 0.2 10*3/uL (ref 0.1–1.0)
Monocytes Relative: 5 %
Neutro Abs: 2.1 10*3/uL (ref 1.7–7.7)
Neutrophils Relative %: 57 %
Platelets: 102 10*3/uL — ABNORMAL LOW (ref 150–400)
RBC: 3.32 MIL/uL — ABNORMAL LOW (ref 3.87–5.11)
RDW: 16.1 % — ABNORMAL HIGH (ref 11.5–15.5)
WBC: 3.6 10*3/uL — ABNORMAL LOW (ref 4.0–10.5)
nRBC: 0 % (ref 0.0–0.2)

## 2022-05-25 LAB — MAGNESIUM: Magnesium: 1.8 mg/dL (ref 1.7–2.4)

## 2022-05-25 MED ORDER — BORTEZOMIB CHEMO SQ INJECTION 3.5 MG (2.5MG/ML)
1.0000 mg/m2 | Freq: Once | INTRAMUSCULAR | Status: AC
  Administered 2022-05-25: 1.75 mg via SUBCUTANEOUS
  Filled 2022-05-25: qty 0.7

## 2022-05-25 MED ORDER — PROCHLORPERAZINE MALEATE 10 MG PO TABS
10.0000 mg | ORAL_TABLET | Freq: Once | ORAL | Status: AC
  Administered 2022-05-25: 10 mg via ORAL
  Filled 2022-05-25: qty 1

## 2022-05-25 MED ORDER — DENOSUMAB 120 MG/1.7ML ~~LOC~~ SOLN
120.0000 mg | Freq: Once | SUBCUTANEOUS | Status: AC
  Administered 2022-05-25: 120 mg via SUBCUTANEOUS
  Filled 2022-05-25: qty 1.7

## 2022-05-25 NOTE — Patient Instructions (Signed)
[facility]  Discharge Instructions: Thank you for choosing [facility] to provide your oncology and hematology care.  If you have a lab appointment with the [facility], please come in thru the Main Entrance and check in at the main information desk.  Wear comfortable clothing and clothing appropriate for easy access to any Portacath or PICC line.   We strive to give you quality time with your provider. You may need to reschedule your appointment if you arrive late (15 or more minutes).  Arriving late affects you and other patients whose appointments are after yours.  Also, if you miss three or more appointments without notifying the office, you may be dismissed from the clinic at the provider's discretion.      For prescription refill requests, have your pharmacy contact our office and allow 72 hours for refills to be completed.    Today you received the following chemotherapy and/or immunotherapy agents Velcade Xgeva      To help prevent nausea and vomiting after your treatment, we encourage you to take your nausea medication as directed.  BELOW ARE SYMPTOMS THAT SHOULD BE REPORTED IMMEDIATELY: *FEVER GREATER THAN 100.4 F (38 C) OR HIGHER *CHILLS OR SWEATING *NAUSEA AND VOMITING THAT IS NOT CONTROLLED WITH YOUR NAUSEA MEDICATION *UNUSUAL SHORTNESS OF BREATH *UNUSUAL BRUISING OR BLEEDING *URINARY PROBLEMS (pain or burning when urinating, or frequent urination) *BOWEL PROBLEMS (unusual diarrhea, constipation, pain near the anus) TENDERNESS IN MOUTH AND THROAT WITH OR WITHOUT PRESENCE OF ULCERS (sore throat, sores in mouth, or a toothache) UNUSUAL RASH, SWELLING OR PAIN  UNUSUAL VAGINAL DISCHARGE OR ITCHING   Items with * indicate a potential emergency and should be followed up as soon as possible or go to the Emergency Department if any problems should occur.  Please show the CHEMOTHERAPY ALERT CARD or IMMUNOTHERAPY ALERT CARD at check-in to the  Emergency Department and triage nurse.  Should you have questions after your visit or need to cancel or reschedule your appointment, please contact [facility] [phone]  and follow the prompts.  Office hours are 8:00 a.m. to 4:30 p.m. Monday - Friday. Please note that voicemails left after 4:00 p.m. may not be returned until the following business day.  We are closed weekends and major holidays. You have access to a nurse at all times for urgent questions. Please call the main number to the clinic [phone] and follow the prompts.  For any non-urgent questions, you may also contact your provider using MyChart. We now offer e-Visits for anyone 59 and older to request care online for non-urgent symptoms. For details visit [email or web address].   Also download the MyChart app! Go to the app store, search "MyChart", open the app, select [facility], and log in with your MyChart username and password.

## 2022-05-25 NOTE — Progress Notes (Signed)
Patient presents today for Velcade and Xgeva per providers order.  Vital signs and labs within parameters for treatment.  Patient has been taking Calcium and Vitamin D, has had no prior or upcoming dental work and no jaw pain.  Stable during administration without incident; injection site WNL; see MAR for injection details.  Patient tolerated procedure well and without incident.  No questions or complaints noted at this time.

## 2022-06-08 ENCOUNTER — Inpatient Hospital Stay: Payer: Medicare Other

## 2022-06-08 ENCOUNTER — Other Ambulatory Visit: Payer: Self-pay

## 2022-06-08 ENCOUNTER — Ambulatory Visit: Payer: Medicare Other | Admitting: Hematology

## 2022-06-08 ENCOUNTER — Other Ambulatory Visit: Payer: Medicare Other

## 2022-06-08 VITALS — BP 112/71 | HR 86 | Temp 97.8°F | Resp 18 | Wt 162.0 lb

## 2022-06-08 DIAGNOSIS — C9000 Multiple myeloma not having achieved remission: Secondary | ICD-10-CM

## 2022-06-08 DIAGNOSIS — Z5112 Encounter for antineoplastic immunotherapy: Secondary | ICD-10-CM | POA: Diagnosis not present

## 2022-06-08 LAB — COMPREHENSIVE METABOLIC PANEL
ALT: 22 U/L (ref 0–44)
AST: 26 U/L (ref 15–41)
Albumin: 4 g/dL (ref 3.5–5.0)
Alkaline Phosphatase: 47 U/L (ref 38–126)
Anion gap: 7 (ref 5–15)
BUN: 15 mg/dL (ref 8–23)
CO2: 30 mmol/L (ref 22–32)
Calcium: 8.5 mg/dL — ABNORMAL LOW (ref 8.9–10.3)
Chloride: 102 mmol/L (ref 98–111)
Creatinine, Ser: 0.73 mg/dL (ref 0.44–1.00)
GFR, Estimated: >60 mL/min (ref >60)
Glucose, Bld: 81 mg/dL (ref 70–99)
Potassium: 3.5 mmol/L (ref 3.5–5.1)
Sodium: 139 mmol/L (ref 135–145)
Total Bilirubin: 0.7 mg/dL (ref 0.3–1.2)
Total Protein: 6.6 g/dL (ref 6.5–8.1)

## 2022-06-08 LAB — CBC WITH DIFFERENTIAL/PLATELET
Abs Immature Granulocytes: 0.01 10*3/uL (ref 0.00–0.07)
Basophils Absolute: 0.1 10*3/uL (ref 0.0–0.1)
Basophils Relative: 1 %
Eosinophils Absolute: 0.4 10*3/uL (ref 0.0–0.5)
Eosinophils Relative: 12 %
HCT: 35.9 % — ABNORMAL LOW (ref 36.0–46.0)
Hemoglobin: 11.7 g/dL — ABNORMAL LOW (ref 12.0–15.0)
Immature Granulocytes: 0 %
Lymphocytes Relative: 26 %
Lymphs Abs: 0.9 10*3/uL (ref 0.7–4.0)
MCH: 33.8 pg (ref 26.0–34.0)
MCHC: 32.6 g/dL (ref 30.0–36.0)
MCV: 103.8 fL — ABNORMAL HIGH (ref 80.0–100.0)
Monocytes Absolute: 0.5 10*3/uL (ref 0.1–1.0)
Monocytes Relative: 13 %
Neutro Abs: 1.7 10*3/uL (ref 1.7–7.7)
Neutrophils Relative %: 48 %
Platelets: 94 10*3/uL — ABNORMAL LOW (ref 150–400)
RBC: 3.46 MIL/uL — ABNORMAL LOW (ref 3.87–5.11)
RDW: 17 % — ABNORMAL HIGH (ref 11.5–15.5)
WBC: 3.5 10*3/uL — ABNORMAL LOW (ref 4.0–10.5)
nRBC: 0 % (ref 0.0–0.2)

## 2022-06-08 LAB — MAGNESIUM: Magnesium: 2 mg/dL (ref 1.7–2.4)

## 2022-06-08 MED ORDER — DEXAMETHASONE 4 MG PO TABS
20.0000 mg | ORAL_TABLET | Freq: Once | ORAL | Status: AC
  Administered 2022-06-08: 20 mg via ORAL
  Filled 2022-06-08: qty 5

## 2022-06-08 MED ORDER — PROCHLORPERAZINE MALEATE 10 MG PO TABS
10.0000 mg | ORAL_TABLET | Freq: Four times a day (QID) | ORAL | Status: DC | PRN
  Administered 2022-06-08: 10 mg via ORAL
  Filled 2022-06-08: qty 1

## 2022-06-08 MED ORDER — BORTEZOMIB CHEMO SQ INJECTION 3.5 MG (2.5MG/ML)
1.0000 mg/m2 | Freq: Once | INTRAMUSCULAR | Status: AC
  Administered 2022-06-08: 1.75 mg via SUBCUTANEOUS
  Filled 2022-06-08: qty 0.7

## 2022-06-08 MED ORDER — LENALIDOMIDE 10 MG PO CAPS: ORAL_CAPSULE | ORAL | 10 refills | Status: DC

## 2022-06-08 NOTE — Telephone Encounter (Signed)
Chart reviewed. Revlimid refilled per last office note with Dr. Katragadda.  

## 2022-06-08 NOTE — Progress Notes (Signed)
Pt presents today for Velcade per provider's order. Vital signs and labs WNL for treatment. Okay to proceed with treatment today.  Velcade given today per MD orders. Tolerated infusion without adverse affects. Vital signs stable. No complaints at this time. Discharged from clinic ambulatory in stable condition. Alert and oriented x 3. F/U with [facility] as scheduled.

## 2022-06-09 LAB — KAPPA/LAMBDA LIGHT CHAINS
Kappa free light chain: 13.1 mg/L (ref 3.3–19.4)
Kappa, lambda light chain ratio: 0.81 (ref 0.26–1.65)
Lambda free light chains: 16.1 mg/L (ref 5.7–26.3)

## 2022-06-10 LAB — PROTEIN ELECTROPHORESIS, SERUM
A/G Ratio: 1.8 — ABNORMAL HIGH (ref 0.7–1.7)
Albumin ELP: 3.9 g/dL (ref 2.9–4.4)
Alpha-1-Globulin: 0.3 g/dL (ref 0.0–0.4)
Alpha-2-Globulin: 0.6 g/dL (ref 0.4–1.0)
Beta Globulin: 0.8 g/dL (ref 0.7–1.3)
Gamma Globulin: 0.5 g/dL (ref 0.4–1.8)
Globulin, Total: 2.2 g/dL (ref 2.2–3.9)
Total Protein ELP: 6.1 g/dL (ref 6.0–8.5)

## 2022-06-11 ENCOUNTER — Other Ambulatory Visit: Payer: Self-pay | Admitting: Family Medicine

## 2022-06-11 DIAGNOSIS — M62838 Other muscle spasm: Secondary | ICD-10-CM

## 2022-06-13 LAB — IMMUNOFIXATION ELECTROPHORESIS
IgA: 123 mg/dL (ref 64–422)
IgG (Immunoglobin G), Serum: 456 mg/dL — ABNORMAL LOW (ref 586–1602)
IgM (Immunoglobulin M), Srm: 25 mg/dL — ABNORMAL LOW (ref 26–217)
Total Protein ELP: 6.1 g/dL (ref 6.0–8.5)

## 2022-06-15 ENCOUNTER — Other Ambulatory Visit: Payer: Self-pay | Admitting: Family Medicine

## 2022-06-17 ENCOUNTER — Other Ambulatory Visit: Payer: Self-pay

## 2022-06-20 ENCOUNTER — Ambulatory Visit: Payer: Medicare Other | Admitting: Family Medicine

## 2022-06-21 ENCOUNTER — Other Ambulatory Visit: Payer: Self-pay

## 2022-06-21 DIAGNOSIS — C9000 Multiple myeloma not having achieved remission: Secondary | ICD-10-CM

## 2022-06-22 ENCOUNTER — Inpatient Hospital Stay: Payer: Medicare Other

## 2022-06-22 ENCOUNTER — Inpatient Hospital Stay: Payer: Medicare Other | Admitting: Hematology

## 2022-06-22 VITALS — BP 123/73 | HR 90 | Temp 98.1°F | Resp 18 | Wt 164.0 lb

## 2022-06-22 DIAGNOSIS — Z5112 Encounter for antineoplastic immunotherapy: Secondary | ICD-10-CM | POA: Diagnosis not present

## 2022-06-22 DIAGNOSIS — C9000 Multiple myeloma not having achieved remission: Secondary | ICD-10-CM

## 2022-06-22 LAB — COMPREHENSIVE METABOLIC PANEL
ALT: 18 U/L (ref 0–44)
AST: 25 U/L (ref 15–41)
Albumin: 4 g/dL (ref 3.5–5.0)
Alkaline Phosphatase: 51 U/L (ref 38–126)
Anion gap: 8 (ref 5–15)
BUN: 11 mg/dL (ref 8–23)
CO2: 31 mmol/L (ref 22–32)
Calcium: 9.1 mg/dL (ref 8.9–10.3)
Chloride: 101 mmol/L (ref 98–111)
Creatinine, Ser: 0.69 mg/dL (ref 0.44–1.00)
GFR, Estimated: >60 mL/min (ref >60)
Glucose, Bld: 74 mg/dL (ref 70–99)
Potassium: 3 mmol/L — ABNORMAL LOW (ref 3.5–5.1)
Sodium: 140 mmol/L (ref 135–145)
Total Bilirubin: 0.8 mg/dL (ref 0.3–1.2)
Total Protein: 6.6 g/dL (ref 6.5–8.1)

## 2022-06-22 LAB — CBC WITH DIFFERENTIAL/PLATELET
Abs Immature Granulocytes: 0.01 10*3/uL (ref 0.00–0.07)
Basophils Absolute: 0.1 10*3/uL (ref 0.0–0.1)
Basophils Relative: 2 %
Eosinophils Absolute: 0.3 10*3/uL (ref 0.0–0.5)
Eosinophils Relative: 7 %
HCT: 34.8 % — ABNORMAL LOW (ref 36.0–46.0)
Hemoglobin: 11.8 g/dL — ABNORMAL LOW (ref 12.0–15.0)
Immature Granulocytes: 0 %
Lymphocytes Relative: 25 %
Lymphs Abs: 1.1 10*3/uL (ref 0.7–4.0)
MCH: 34.7 pg — ABNORMAL HIGH (ref 26.0–34.0)
MCHC: 33.9 g/dL (ref 30.0–36.0)
MCV: 102.4 fL — ABNORMAL HIGH (ref 80.0–100.0)
Monocytes Absolute: 0.2 10*3/uL (ref 0.1–1.0)
Monocytes Relative: 6 %
Neutro Abs: 2.7 10*3/uL (ref 1.7–7.7)
Neutrophils Relative %: 60 %
Platelets: 106 10*3/uL — ABNORMAL LOW (ref 150–400)
RBC: 3.4 MIL/uL — ABNORMAL LOW (ref 3.87–5.11)
RDW: 16.3 % — ABNORMAL HIGH (ref 11.5–15.5)
WBC: 4.4 10*3/uL (ref 4.0–10.5)
nRBC: 0 % (ref 0.0–0.2)

## 2022-06-22 LAB — MAGNESIUM: Magnesium: 1.7 mg/dL (ref 1.7–2.4)

## 2022-06-22 MED ORDER — BORTEZOMIB CHEMO SQ INJECTION 3.5 MG (2.5MG/ML)
1.0000 mg/m2 | Freq: Once | INTRAMUSCULAR | Status: AC
  Administered 2022-06-22: 1.75 mg via SUBCUTANEOUS
  Filled 2022-06-22: qty 0.7

## 2022-06-22 MED ORDER — PROCHLORPERAZINE MALEATE 10 MG PO TABS
10.0000 mg | ORAL_TABLET | Freq: Once | ORAL | Status: AC
  Administered 2022-06-22: 10 mg via ORAL
  Filled 2022-06-22: qty 1

## 2022-06-22 NOTE — Progress Notes (Signed)
Patient presents today for Velcade injection.  Patient is in satisfactory condition with no new complaints voiced.  Vital signs are stable.  We will proceed with injection per MD orders.  Patient tolerated injection well with no complaints voiced.  Patient left ambulatory with walker in stable condition.  Vital signs stable at discharge.  Follow up as scheduled.

## 2022-06-22 NOTE — Patient Instructions (Signed)
[facility]  Discharge Instructions: Thank you for choosing [facility] to provide your oncology and hematology care.  If you have a lab appointment with the [facility], please come in thru the Main Entrance and check in at the main information desk.  Wear comfortable clothing and clothing appropriate for easy access to any Portacath or PICC line.   We strive to give you quality time with your provider. You may need to reschedule your appointment if you arrive late (15 or more minutes).  Arriving late affects you and other patients whose appointments are after yours.  Also, if you miss three or more appointments without notifying the office, you may be dismissed from the clinic at the provider's discretion.      For prescription refill requests, have your pharmacy contact our office and allow 72 hours for refills to be completed.    Today you received the following chemotherapy and/or immunotherapy agents Velcade.  Bortezomib Injection What is this medication? BORTEZOMIB (bor TEZ oh mib) treats lymphoma. It may also be used to treat multiple myeloma, a type of bone marrow cancer. It works by blocking a protein that causes cancer cells to grow and multiply. This helps to slow or stop the spread of cancer cells. This medicine may be used for other purposes; ask your health care provider or pharmacist if you have questions. COMMON BRAND NAME(S): Velcade What should I tell my care team before I take this medication? They need to know if you have any of these conditions: Dehydration Diabetes Heart disease Liver disease Tingling of the fingers or toes or other nerve disorder An unusual or allergic reaction to bortezomib, other medications, foods, dyes, or preservatives If you or your partner are pregnant or trying to get pregnant Breastfeeding How should I use this medication? This medication is injected into a vein or under the skin. It is given by your  care team in a hospital or clinic setting. Talk to your care team about the use of this medication in children. Special care may be needed. Overdosage: If you think you have taken too much of this medicine contact a poison control center or emergency room at once. NOTE: This medicine is only for you. Do not share this medicine with others. What if I miss a dose? Keep appointments for follow-up doses. It is important not to miss your dose. Call your care team if you are unable to keep an appointment. What may interact with this medication? Ketoconazole Rifampin This list may not describe all possible interactions. Give your health care provider a list of all the medicines, herbs, non-prescription drugs, or dietary supplements you use. Also tell them if you smoke, drink alcohol, or use illegal drugs. Some items may interact with your medicine. What should I watch for while using this medication? Your condition will be monitored carefully while you are receiving this medication. You may need blood work while taking this medication. This medication may affect your coordination, reaction time, or judgment. Do not drive or operate machinery until you know how this medication affects you. Sit up or stand slowly to reduce the risk of dizzy or fainting spells. Drinking alcohol with this medication can increase the risk of these side effects. This medication may increase your risk of getting an infection. Call your care team for advice if you get a fever, chills, sore throat, or other symptoms of a cold or flu. Do not treat yourself. Try to avoid being around  people who are sick. Check with your care team if you have severe diarrhea, nausea, and vomiting, or if you sweat a lot. The loss of too much body fluid may make it dangerous for you to take this medication. Talk to your care team if you may be pregnant. Serious birth defects can occur if you take this medication during pregnancy and for 7 months after the  last dose. You will need a negative pregnancy test before starting this medication. Contraception is recommended while taking this medication and for 7 months after the last dose. Your care team can help you find the option that works for you. If your partner can get pregnant, use a condom during sex while taking this medication and for 4 months after the last dose. Do not breastfeed while taking this medication and for 2 months after the last dose. This medication may cause infertility. Talk to your care team if you are concerned about your fertility. What side effects may I notice from receiving this medication? Side effects that you should report to your care team as soon as possible: Allergic reactions--skin rash, itching, hives, swelling of the face, lips, tongue, or throat Bleeding--bloody or black, tar-like stools, vomiting blood or Kyani Simkin material that looks like coffee grounds, red or dark Monseratt Ledin urine, small red or purple spots on skin, unusual bruising or bleeding Bleeding in the brain--severe headache, stiff neck, confusion, dizziness, change in vision, numbness or weakness of the face, arm, or leg, trouble speaking, trouble walking, vomiting Bowel blockage--stomach cramping, unable to have a bowel movement or pass gas, loss of appetite, vomiting Heart failure--shortness of breath, swelling of the ankles, feet, or hands, sudden weight gain, unusual weakness or fatigue Infection--fever, chills, cough, sore throat, wounds that don't heal, pain or trouble when passing urine, general feeling of discomfort or being unwell Liver injury--right upper belly pain, loss of appetite, nausea, light-colored stool, dark yellow or Cornesha Radziewicz urine, yellowing skin or eyes, unusual weakness or fatigue Low blood pressure--dizziness, feeling faint or lightheaded, blurry vision Lung injury--shortness of breath or trouble breathing, cough, spitting up blood, chest pain, fever Pain, tingling, or numbness in the hands  or feet Severe or prolonged diarrhea Stomach pain, bloody diarrhea, pale skin, unusual weakness or fatigue, decrease in the amount of urine, which may be signs of hemolytic uremic syndrome Sudden and severe headache, confusion, change in vision, seizures, which may be signs of posterior reversible encephalopathy syndrome (PRES) TTP--purple spots on the skin or inside the mouth, pale skin, yellowing skin or eyes, unusual weakness or fatigue, fever, fast or irregular heartbeat, confusion, change in vision, trouble speaking, trouble walking Tumor lysis syndrome (TLS)--nausea, vomiting, diarrhea, decrease in the amount of urine, dark urine, unusual weakness or fatigue, confusion, muscle pain or cramps, fast or irregular heartbeat, joint pain Side effects that usually do not require medical attention (report to your care team if they continue or are bothersome): Constipation Diarrhea Fatigue Loss of appetite Nausea This list may not describe all possible side effects. Call your doctor for medical advice about side effects. You may report side effects to FDA at 1-800-FDA-1088. Where should I keep my medication? This medication is given in a hospital or clinic. It will not be stored at home. NOTE: This sheet is a summary. It may not cover all possible information. If you have questions about this medicine, talk to your doctor, pharmacist, or health care provider.  2023 Elsevier/Gold Standard (2021-10-06 00:00:00)        To help  prevent nausea and vomiting after your treatment, we encourage you to take your nausea medication as directed.  BELOW ARE SYMPTOMS THAT SHOULD BE REPORTED IMMEDIATELY: *FEVER GREATER THAN 100.4 F (38 C) OR HIGHER *CHILLS OR SWEATING *NAUSEA AND VOMITING THAT IS NOT CONTROLLED WITH YOUR NAUSEA MEDICATION *UNUSUAL SHORTNESS OF BREATH *UNUSUAL BRUISING OR BLEEDING *URINARY PROBLEMS (pain or burning when urinating, or frequent urination) *BOWEL PROBLEMS (unusual diarrhea,  constipation, pain near the anus) TENDERNESS IN MOUTH AND THROAT WITH OR WITHOUT PRESENCE OF ULCERS (sore throat, sores in mouth, or a toothache) UNUSUAL RASH, SWELLING OR PAIN  UNUSUAL VAGINAL DISCHARGE OR ITCHING   Items with * indicate a potential emergency and should be followed up as soon as possible or go to the Emergency Department if any problems should occur.  Please show the CHEMOTHERAPY ALERT CARD or IMMUNOTHERAPY ALERT CARD at check-in to the Emergency Department and triage nurse.  Should you have questions after your visit or need to cancel or reschedule your appointment, please contact [facility] [phone]  and follow the prompts.  Office hours are 8:00 a.m. to 4:30 p.m. Monday - Friday. Please note that voicemails left after 4:00 p.m. may not be returned until the following business day.  We are closed weekends and major holidays. You have access to a nurse at all times for urgent questions. Please call the main number to the clinic [phone] and follow the prompts.  For any non-urgent questions, you may also contact your provider using MyChart. We now offer e-Visits for anyone 32 and older to request care online for non-urgent symptoms. For details visit [email or web address].   Also download the MyChart app! Go to the app store, search "MyChart", open the app, select [facility], and log in with your MyChart username and password.

## 2022-06-22 NOTE — Progress Notes (Signed)
Per Dr. Delton Coombes, instructed patient to take 2 additional potassium tablets today and we will recheck on 2/15.

## 2022-06-23 ENCOUNTER — Ambulatory Visit (INDEPENDENT_AMBULATORY_CARE_PROVIDER_SITE_OTHER): Payer: Medicare Other | Admitting: Family Medicine

## 2022-06-23 ENCOUNTER — Encounter: Payer: Self-pay | Admitting: Hematology

## 2022-06-23 ENCOUNTER — Encounter (HOSPITAL_COMMUNITY): Payer: Self-pay | Admitting: Hematology

## 2022-06-23 ENCOUNTER — Encounter: Payer: Self-pay | Admitting: Family Medicine

## 2022-06-23 VITALS — BP 111/73 | HR 85 | Ht 63.0 in | Wt 164.0 lb

## 2022-06-23 DIAGNOSIS — E7849 Other hyperlipidemia: Secondary | ICD-10-CM | POA: Diagnosis not present

## 2022-06-23 DIAGNOSIS — E038 Other specified hypothyroidism: Secondary | ICD-10-CM

## 2022-06-23 DIAGNOSIS — E559 Vitamin D deficiency, unspecified: Secondary | ICD-10-CM

## 2022-06-23 DIAGNOSIS — I10 Essential (primary) hypertension: Secondary | ICD-10-CM | POA: Diagnosis not present

## 2022-06-23 DIAGNOSIS — M25561 Pain in right knee: Secondary | ICD-10-CM | POA: Diagnosis not present

## 2022-06-23 DIAGNOSIS — K219 Gastro-esophageal reflux disease without esophagitis: Secondary | ICD-10-CM

## 2022-06-23 DIAGNOSIS — E782 Mixed hyperlipidemia: Secondary | ICD-10-CM

## 2022-06-23 DIAGNOSIS — R7301 Impaired fasting glucose: Secondary | ICD-10-CM

## 2022-06-23 LAB — KAPPA/LAMBDA LIGHT CHAINS
Kappa free light chain: 10.4 mg/L (ref 3.3–19.4)
Kappa, lambda light chain ratio: 0.91 (ref 0.26–1.65)
Lambda free light chains: 11.4 mg/L (ref 5.7–26.3)

## 2022-06-23 MED ORDER — SIMVASTATIN 40 MG PO TABS: 40.0000 mg | ORAL_TABLET | Freq: Every day | ORAL | 1 refills | Status: DC

## 2022-06-23 NOTE — Assessment & Plan Note (Signed)
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

## 2022-06-23 NOTE — Assessment & Plan Note (Signed)
She takes simvastatin 40 mg daily at bedtime Denies muscle aches and pain Reports compliance with treatment regimen

## 2022-06-23 NOTE — Patient Instructions (Signed)
I appreciate the opportunity to provide care to you today!    Follow up:  4 months  Labs: please stop by the lab during the week to get your blood drawn ( TSH, Lipid profile, HgA1c, Vit D)   Please pick up your medications at the pharmacy   Please continue to a heart-healthy diet and increase your physical activities. Try to exercise for 90mns at least five times a week.   Physical activity helps: Lower your blood glucose, improve your heart health, lower your blood pressure and cholesterol, burn calories to help manage her weight, gave you energy, lower stress, and improve his sleep.  The American diabetes Association (ADA) recommends being active for 2-1/2 hours (150 minutes) or more week.  Exercise for 30 minutes, 5 days a week (150 minutes total)    It was a pleasure to see you and I look forward to continuing to work together on your health and well-being. Please do not hesitate to call the office if you need care or have questions about your care.   Have a wonderful day and week. With Gratitude, GAlvira MondayMSN, FNP-BC

## 2022-06-23 NOTE — Progress Notes (Signed)
Established Patient Office Visit  Subjective:  Patient ID: Courtney Rogers, female    DOB: 10/18/1950  Age: 72 y.o. MRN: [id number]  CC:  Chief Complaint  Patient presents with   Follow-up    Pt following up. Report knee pain today, from surgery in the past.     HPI Courtney Rogers is a 72 y.o. female with past medical history of essential hypertension, allergic rhinitis, asthma, and GERD presents for f/u of  chronic medical conditions. For the details of today's visit, please refer to the assessment and plan.     Past Medical History:  Diagnosis Date   Acute myocardial infarction [facility]) 2009   CAD/no stent medically managed   Anxiety disorder    CAD (coronary artery disease)    Cancer ([place])    multiple myeloma   Hyperlipidemia    Hypertension    IBS (irritable bowel syndrome)    Non-ulcer dyspepsia 04/30/2009   Qualifier: Diagnosis of  By: Oneida Alar MD, Sandi L    Overactive bladder    PE (pulmonary embolism) 10/2012   left lung   Presence of permanent cardiac pacemaker    Sleep apnea     Past Surgical History:  Procedure Laterality Date   ABDOMINAL HYSTERECTOMY     BSO secondary to cyst     CARDIAC CATHETERIZATION     COLONOSCOPY  12/2009   Dr. [place] Carbo, propofol, normal. Next TCS 12/2019   COLONOSCOPY N/A 05/22/2017   Examined portion ileum was normal, significant looping of colon, external hemorrhoids and rectal bleeding due to internal hemorrhoids, mild diverticulosis procedure: COLONOSCOPY;  Surgeon: Danie Binder, MD;  [facility];  Service: Endoscopy;  Laterality: N/A;  10:30am   ESOPHAGOGASTRODUODENOSCOPY  05/11/2009   schatzki ring/small hiatal hernia/path:gastritis   ESOPHAGOGASTRODUODENOSCOPY N/A 05/22/2017   Multiple gastric polyps with biopsy benign fundic gland polyp, small bowel biopsy negative for celiac, gastric biopsy with mild gastritis but no H. pylori procedure: ESOPHAGOGASTRODUODENOSCOPY (EGD);  Surgeon: Danie Binder, MD;   Location[facility];  Service: Endoscopy;  Laterality: N/A;   ESOPHAGOGASTRODUODENOSCOPY (EGD) WITH ESOPHAGEAL DILATION N/A 04/01/2013   HEN:[id number] at the gastroesophageal juction/multiple small polyps/mild gastritis   GIVENS CAPSULE STUDY N/A 06/07/2017   Frequent gastric erosions, normal small bowel mucosa.  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    Family History  Problem Relation Age of Onset   Colon polyps Neg Hx    Colon cancer Neg Hx     Social History   Socioeconomic History   Marital status: Married    Spouse name: Games developer   Number of children: 3   Years of education: 16   Highest education level: Not on file  Occupational History   Not on file  Tobacco Use   Smoking status: Never    Passive exposure: Never   Smokeless tobacco: Never   Tobacco comments:    Never smoked   Vaping Use   Vaping Use: Never used  Substance and Sexual Activity   Alcohol use: Not Currently    Comment: occ wine   Drug use: No   Sexual activity: Not Currently  Other Topics Concern   Not on file  Social History Narrative      Lives with  husband-51 years 61 in Aug 2021   Daughter is close by, 2 sons live further away   One IN [place],ONE IN [place], [place].        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   Enjoys: reading, young adult      Diet: eats all food groups    Caffeine: coffee 1, tea daily soda-1 daily   Water: 1-2 cups      Wears seat belt    Does not use phone while driving   Smoke detectors at home    Public house manager -locked up      Left handed   One story home   Drinks caffeine   Social Determinants of Health   Financial Resource Strain: Low Risk  (11/15/2021)   Overall Financial Resource Strain (CARDIA)     Difficulty of Paying Living Expenses: Not hard at all  Food Insecurity: No Food Insecurity (11/15/2021)   Hunger Vital Sign    Worried About Running Out of Food in the Last Year: Never true    Ran Out of Food in the Last Year: Never true  Transportation Needs: No Transportation Needs (11/15/2021)   PRAPARE - Hydrologist (Medical): No    Lack of Transportation (Non-Medical): No  Physical Activity: Inactive (11/26/2021)   Exercise Vital Sign    Days of Exercise per Week: 0 days    Minutes of Exercise per Session: 0 min  Stress: Stress Concern Present (11/26/2021)   [place]    Feeling of Stress : To some extent  Social Connections: Moderately Integrated (11/11/2020)   Social Connection and Isolation Panel [NHANES]    Frequency of Communication with Friends and Family: Once a week    Frequency of Social Gatherings with Friends and Family: Once a week    Attends Religious Services: More than 4 times per year    Active Member of [employer] or Organizations: Yes    Attends Archivist Meetings: More than 4 times per year    Marital Status: Married  Human resources officer Violence: Not At Risk (11/11/2020)   Humiliation, Afraid, Rape, and Kick questionnaire    Fear of Current or Ex-Partner: No    Emotionally Abused: No    Physically Abused: No    Sexually Abused: No    Outpatient Medications Prior to Visit  Medication Sig Dispense Refill   acetaminophen (TYLENOL) 500 MG tablet Take 1,000 mg by mouth 2 (two) times daily as needed for moderate pain or headache.     acyclovir (ZOVIRAX) 400 MG tablet TAKE 1 TABLET BY MOUTH TWICE DAILY 60 tablet 6   albuterol (VENTOLIN HFA) 108 (90 Base) MCG/ACT inhaler USE 2 PUFFS ONE TO TWO TIMES A DAY. 18 g 5   amLODipine (NORVASC) 10 MG tablet TAKE 1 TABLET BY MOUTH ONCE DAILY. 90 tablet 0   ascorbic acid (VITAMIN C) 500 MG tablet Take 500 mg by mouth daily.      aspirin EC 81 MG tablet Take 81 mg by mouth at bedtime.     B Complex Vitamins (VITAMIN B COMPLEX) TABS Take 1 tablet by mouth daily.     bortezomib SQ (VELCADE) 3.5 MG injection Inject into the skin once.     busPIRone (BUSPAR) 5 MG tablet Take 1 tablet (5 mg total) by mouth daily. 30 tablet 3   Calcium Carbonate-Vitamin D (CALCIUM 600+D PO) Take 1 tablet by mouth 2 (  two) times daily.     chlorthalidone (HYGROTON) 25 MG tablet Take 25 mg by mouth daily.     cholecalciferol (VITAMIN D3) 10 MCG (400 UNIT) TABS tablet Take 400 Units by mouth daily.     cyanocobalamin 1000 MCG tablet Take 1 tablet by mouth daily.     cyclobenzaprine (FLEXERIL) 10 MG tablet TAKE 1/2 TABLET BY MOUTH TWICE DAILY AS NEEDED FOR MUSCLE SPASMS 20 tablet 0   Daratumumab-Hyaluronidase-fihj (DARZALEX FASPRO [place]) Inject into the skin.     diclofenac Sodium (VOLTAREN) 1 % GEL Apply 4 g topically 4 (four) times daily. 50 g 0   doxycycline (VIBRAMYCIN) 100 MG capsule Take 100 mg by mouth daily.     fluticasone (FLONASE) 50 MCG/ACT nasal spray USE 2 SPRAYS IN EACH NOSTRIL ONCE DAILY. 16 g 0   folic acid (FOLVITE) 1 MG tablet TAKE 1 TABLET BY MOUTH ONCE DAILY. 30 tablet 11   gabapentin (NEURONTIN) 100 MG capsule Take 100 mg by mouth 3 (three) times daily.     gabapentin (NEURONTIN) 600 MG tablet Take by mouth.     hydrochlorothiazide (MICROZIDE) 12.5 MG capsule Take 12.5 mg by mouth daily.     HYDROcodone-acetaminophen (NORCO/VICODIN) 5-325 MG tablet Take 1 tablet by mouth every 8 (eight) hours as needed for moderate pain. 60 tablet 0   hydrOXYzine (VISTARIL) 25 MG capsule      Hyoscyamine Sulfate SL 0.125 MG SUBL      lenalidomide (REVLIMID) 10 MG capsule Take 1 capsule (10 mg total) by mouth daily. 21 days on, 7 days off every 28 days 21 capsule 10   lidocaine (HM LIDOCAINE PATCH) 4 % Place 1 patch onto the skin daily. 14 patch 0   lidocaine (XYLOCAINE) 2 % solution Take 2 teaspoons before meals and at bedtime as needed. May  repeat every 4 hours for a maximum dose of 8 per day. 300 mL 0   Lidocaine HCl (XOLIDO) 2 % CREA Apply 1 application topically daily as needed (arthritisis).     loperamide (IMODIUM) 2 MG capsule Take 4 mg by mouth as needed. Take 2 tablets after the first loose stool and then 1 tablet after each loose stool.  Do not exceed 8 capsules within 24 hours.     magnesium oxide (MAG-OX) 400 (240 Mg) MG tablet Take 1 tablet (400 mg total) by mouth 2 (two) times daily. 60 tablet 4   metoprolol tartrate (LOPRESSOR) 50 MG tablet Take 1 tablet (50 mg total) by mouth 2 (two) times daily. (Patient taking differently: Take 25 mg by mouth 2 (two) times daily.) 180 tablet 1   mirtazapine (REMERON) 15 MG tablet Take 15 mg by mouth at bedtime.     montelukast (SINGULAIR) 10 MG tablet Take 10 mg by mouth daily.      MYRBETRIQ 25 MG TB24 tablet TAKE ONE TABLET BY MOUTH ONCE DAILY 30 tablet 0   nitroGLYCERIN (NITROSTAT) 0.4 MG SL tablet Place 0.4 mg under the tongue every 5 (five) minutes as needed for chest pain.     pantoprazole (PROTONIX) 40 MG tablet Take 40 mg by mouth daily.     potassium chloride SA (KLOR-CON M) 20 MEQ tablet Take 1 tablet (20 mEq total) by mouth 3 (three) times daily. 21 tablet 0   primidone (MYSOLINE) 50 MG tablet Take 75 mg by mouth daily.     Probiotic Product (GNP PROBIOTIC COLON SUPPORT) CAPS Take 1 capsule by mouth daily.     sertraline (ZOLOFT) 100 MG tablet  Take 200 mg by mouth daily.      tadalafil, PAH, (ALYQ) 20 MG tablet Take 2 tablets every day by oral route.     traMADol (ULTRAM) 50 MG tablet Take 50 mg by mouth 2 (two) times daily.     trimethoprim-polymyxin b (POLYTRIM) ophthalmic solution INSTILL 1 DROP INTO AFFECTED EYE(S) BY OPHTHALMIC ROUTE EVERY 6 HOURS FOR 7 DAYS     warfarin (COUMADIN) 3 MG tablet Take 3 mg by mouth daily.     simvastatin (ZOCOR) 40 MG tablet Take 40 mg by mouth at bedtime.     losartan (COZAAR) 100 MG tablet Take 1 tablet (100 mg total) by mouth daily.  30 tablet 3   No facility-administered medications prior to visit.    Allergies  Allergen Reactions   Amoxicillin-Pot Clavulanate Other (See Comments)   Biaxin [Clarithromycin] Other (See Comments)    Stomach problems    Erythromycin    Lisinopril Swelling   Amoxicillin Diarrhea    ROS Review of Systems  Constitutional:  Negative for chills and fever.  Eyes:  Negative for visual disturbance.  Respiratory:  Negative for chest tightness and shortness of breath.   Musculoskeletal:  Positive for arthralgias.  Neurological:  Negative for dizziness and headaches.      Objective:    Physical Exam HENT:     Head: Normocephalic.     Mouth/Throat:     Mouth: Mucous membranes are moist.  Cardiovascular:     Rate and Rhythm: Normal rate.     Heart sounds: Normal heart sounds.  Pulmonary:     Effort: Pulmonary effort is normal.     Breath sounds: Normal breath sounds.  Musculoskeletal:     Comments: Range of motion of the right knee is intact No evidence of effusion, warmth, and tenderness with palpation   Neurological:     Mental Status: She is alert.     BP 111/73   Pulse 85   Ht '5\' 3"'$  (1.6 m)   Wt 164 lb 0.6 oz (74.4 kg)   SpO2 93%   BMI 29.06 kg/m  Wt Readings from Last 3 Encounters:  06/23/22 164 lb 0.6 oz (74.4 kg)  06/22/22 164 lb (74.4 kg)  06/08/22 162 lb (73.5 kg)    Lab Results  Component Value Date   TSH 0.716 02/14/2022   Lab Results  Component Value Date   WBC 4.4 06/22/2022   HGB 11.8 (L) 06/22/2022   HCT 34.8 (L) 06/22/2022   MCV 102.4 (H) 06/22/2022   PLT 106 (L) 06/22/2022   Lab Results  Component Value Date   NA 140 06/22/2022   K 3.0 (L) 06/22/2022   CO2 31 06/22/2022   GLUCOSE 74 06/22/2022   BUN 11 06/22/2022   CREATININE 0.69 06/22/2022   BILITOT 0.8 06/22/2022   ALKPHOS 51 06/22/2022   AST 25 06/22/2022   ALT 18 06/22/2022   PROT 6.6 06/22/2022   ALBUMIN 4.0 06/22/2022   CALCIUM 9.1 06/22/2022   ANIONGAP 8 06/22/2022    EGFR 58 (L) 06/04/2021   Lab Results  Component Value Date   CHOL 121 02/14/2022   Lab Results  Component Value Date   HDL 68 02/14/2022   Lab Results  Component Value Date   LDLCALC 39 02/14/2022   Lab Results  Component Value Date   TRIG 66 02/14/2022   Lab Results  Component Value Date   CHOLHDL 1.8 02/14/2022   Lab Results  Component Value Date   HGBA1C 5.3  02/14/2022      Assessment & Plan:  Essential hypertension Assessment & Plan: Controlled She takes amlodipine 10 mg daily losartan 100 mg daily and metoprolol 25 mg twice daily She reports that that her hydrochlorothiazide 12.5 mg daily was discontinued by her cardiologist, however she was encouraged to take potassium 20 mEq 3 times daily but she has been taking the medication 2 times daily Encouraged the patient to take the medication as prescribed and follow-up with her cardiologist if she has any questions   Mixed hyperlipidemia Assessment & Plan: She takes simvastatin 40 mg daily at bedtime Denies muscle aches and pain Reports compliance with treatment regimen   Gastroesophageal reflux disease without esophagitis Assessment & Plan: She takes Protonix 40 mg daily No complaints or concerns voiced Encouraged to continue treatment regimen   Right knee pain, unspecified chronicity Assessment & Plan: Reports history of right knee replacement by Dr.Mahoney She reports following up with her surgeon on 06/21/2022 with unremarkable imaging studies of the right knee The patient was referred to [facility] for follow-up Today she complains of right knee pain Pain is rated 5 out of 10 Pain is aggravated with movement and relieved with rest No recent injury or trauma reported Encouraged to continue taking hydrocodone-acetaminophen 5-325 Encouraged to follow-up with orthopedics as recommended by her surgeon    Other hyperlipidemia -     Simvastatin; Take 1 tablet (40 mg total) by mouth at bedtime.   Dispense: 90 tablet; Refill: 1 -     Lipid panel  Vitamin D deficiency -     VITAMIN D 25 Hydroxy (Vit-D Deficiency, Fractures)  IFG (impaired fasting glucose) -     Hemoglobin A1c  Other specified hypothyroidism -     TSH + free T4    Follow-up: Return in about 4 months (around 10/22/2022).   Alvira Monday, FNP

## 2022-06-23 NOTE — Assessment & Plan Note (Signed)
Controlled She takes amlodipine 10 mg daily losartan 100 mg daily and metoprolol 25 mg twice daily She reports that that her hydrochlorothiazide 12.5 mg daily was discontinued by her cardiologist, however she was encouraged to take potassium 20 mEq 3 times daily but she has been taking the medication 2 times daily Encouraged the patient to take the medication as prescribed and follow-up with her cardiologist if she has any questions

## 2022-06-23 NOTE — Assessment & Plan Note (Signed)
She takes Protonix 40 mg daily No complaints or concerns voiced Encouraged to continue treatment regimen

## 2022-06-25 ENCOUNTER — Other Ambulatory Visit: Payer: Self-pay | Admitting: Family Medicine

## 2022-06-25 DIAGNOSIS — M62838 Other muscle spasm: Secondary | ICD-10-CM

## 2022-06-27 LAB — PROTEIN ELECTROPHORESIS, SERUM
A/G Ratio: 1.8 — ABNORMAL HIGH (ref 0.7–1.7)
Albumin ELP: 3.8 g/dL (ref 2.9–4.4)
Alpha-1-Globulin: 0.3 g/dL (ref 0.0–0.4)
Alpha-2-Globulin: 0.7 g/dL (ref 0.4–1.0)
Beta Globulin: 0.8 g/dL (ref 0.7–1.3)
Gamma Globulin: 0.4 g/dL (ref 0.4–1.8)
Globulin, Total: 2.1 g/dL — ABNORMAL LOW (ref 2.2–3.9)
Total Protein ELP: 5.9 g/dL — ABNORMAL LOW (ref 6.0–8.5)

## 2022-06-28 ENCOUNTER — Telehealth: Payer: Self-pay | Admitting: Family Medicine

## 2022-06-28 ENCOUNTER — Other Ambulatory Visit: Payer: Self-pay | Admitting: Family Medicine

## 2022-06-28 DIAGNOSIS — M62838 Other muscle spasm: Secondary | ICD-10-CM

## 2022-06-28 LAB — LIPID PANEL
Chol/HDL Ratio: 2.2 ratio (ref 0.0–4.4)
Cholesterol, Total: 146 mg/dL (ref 100–199)
HDL: 65 mg/dL (ref >39)
LDL Chol Calc (NIH): 62 mg/dL (ref 0–99)
Triglycerides: 102 mg/dL (ref 0–149)
VLDL Cholesterol Cal: 19 mg/dL (ref 5–40)

## 2022-06-28 LAB — TSH+FREE T4
Free T4: 1.13 ng/dL (ref 0.82–1.77)
TSH: 1.18 u[IU]/mL (ref 0.450–4.500)

## 2022-06-28 LAB — HEMOGLOBIN A1C
Est. average glucose Bld gHb Est-mCnc: 103 mg/dL
Hgb A1c MFr Bld: 5.2 % (ref 4.8–5.6)

## 2022-06-28 LAB — IMMUNOFIXATION ELECTROPHORESIS
IgA: 111 mg/dL (ref 64–422)
IgG (Immunoglobin G), Serum: 473 mg/dL — ABNORMAL LOW (ref 586–1602)
IgM (Immunoglobulin M), Srm: 25 mg/dL — ABNORMAL LOW (ref 26–217)
Total Protein ELP: 5.8 g/dL — ABNORMAL LOW (ref 6.0–8.5)

## 2022-06-28 LAB — VITAMIN D 25 HYDROXY (VIT D DEFICIENCY, FRACTURES): Vit D, 25-Hydroxy: 45.5 ng/mL (ref 30.0–100.0)

## 2022-06-28 MED ORDER — CYCLOBENZAPRINE HCL 10 MG PO TABS: ORAL_TABLET | ORAL | 0 refills | Status: DC

## 2022-06-28 NOTE — Progress Notes (Signed)
Please inform the patient her labs are stable

## 2022-06-28 NOTE — Telephone Encounter (Signed)
Patient called about lab results, patient asked if nurse can return her call after 2:00 pm. She has to take her husband to another appointment today.

## 2022-06-28 NOTE — Telephone Encounter (Signed)
Rx sent 

## 2022-06-28 NOTE — Telephone Encounter (Signed)
Tried calling pt unable to reach her.

## 2022-06-29 NOTE — Telephone Encounter (Signed)
Pt informed labs were all stable.

## 2022-07-06 ENCOUNTER — Other Ambulatory Visit: Payer: Self-pay

## 2022-07-06 ENCOUNTER — Other Ambulatory Visit: Payer: Self-pay | Admitting: Family Medicine

## 2022-07-06 DIAGNOSIS — R32 Unspecified urinary incontinence: Secondary | ICD-10-CM

## 2022-07-06 MED ORDER — LENALIDOMIDE 10 MG PO CAPS: ORAL_CAPSULE | ORAL | 10 refills | Status: DC

## 2022-07-06 NOTE — Telephone Encounter (Signed)
Chart reviewed. Revlimid refilled per last office note with Dr. Katragadda.  

## 2022-07-07 ENCOUNTER — Inpatient Hospital Stay (HOSPITAL_BASED_OUTPATIENT_CLINIC_OR_DEPARTMENT_OTHER): Payer: Medicare Other | Admitting: Hematology

## 2022-07-07 ENCOUNTER — Inpatient Hospital Stay: Payer: Medicare Other

## 2022-07-07 ENCOUNTER — Inpatient Hospital Stay: Payer: Medicare Other | Attending: Hematology

## 2022-07-07 DIAGNOSIS — Z5112 Encounter for antineoplastic immunotherapy: Secondary | ICD-10-CM | POA: Insufficient documentation

## 2022-07-07 DIAGNOSIS — Z7901 Long term (current) use of anticoagulants: Secondary | ICD-10-CM | POA: Insufficient documentation

## 2022-07-07 DIAGNOSIS — C9000 Multiple myeloma not having achieved remission: Secondary | ICD-10-CM | POA: Diagnosis present

## 2022-07-07 DIAGNOSIS — Z86711 Personal history of pulmonary embolism: Secondary | ICD-10-CM | POA: Diagnosis not present

## 2022-07-07 DIAGNOSIS — D696 Thrombocytopenia, unspecified: Secondary | ICD-10-CM | POA: Insufficient documentation

## 2022-07-07 DIAGNOSIS — Z79624 Long term (current) use of inhibitors of nucleotide synthesis: Secondary | ICD-10-CM | POA: Diagnosis not present

## 2022-07-07 DIAGNOSIS — E878 Other disorders of electrolyte and fluid balance, not elsewhere classified: Secondary | ICD-10-CM | POA: Diagnosis not present

## 2022-07-07 DIAGNOSIS — Z79899 Other long term (current) drug therapy: Secondary | ICD-10-CM | POA: Diagnosis not present

## 2022-07-07 LAB — CBC WITH DIFFERENTIAL/PLATELET
Abs Immature Granulocytes: 0.01 10*3/uL (ref 0.00–0.07)
Basophils Absolute: 0 10*3/uL (ref 0.0–0.1)
Basophils Relative: 1 %
Eosinophils Absolute: 0.1 10*3/uL (ref 0.0–0.5)
Eosinophils Relative: 3 %
HCT: 35.9 % — ABNORMAL LOW (ref 36.0–46.0)
Hemoglobin: 11.9 g/dL — ABNORMAL LOW (ref 12.0–15.0)
Immature Granulocytes: 0 %
Lymphocytes Relative: 18 %
Lymphs Abs: 0.8 10*3/uL (ref 0.7–4.0)
MCH: 33.6 pg (ref 26.0–34.0)
MCHC: 33.1 g/dL (ref 30.0–36.0)
MCV: 101.4 fL — ABNORMAL HIGH (ref 80.0–100.0)
Monocytes Absolute: 0.6 10*3/uL (ref 0.1–1.0)
Monocytes Relative: 15 %
Neutro Abs: 2.7 10*3/uL (ref 1.7–7.7)
Neutrophils Relative %: 63 %
Platelets: 107 10*3/uL — ABNORMAL LOW (ref 150–400)
RBC: 3.54 MIL/uL — ABNORMAL LOW (ref 3.87–5.11)
RDW: 15.9 % — ABNORMAL HIGH (ref 11.5–15.5)
WBC: 4.2 10*3/uL (ref 4.0–10.5)
nRBC: 0 % (ref 0.0–0.2)

## 2022-07-07 LAB — COMPREHENSIVE METABOLIC PANEL
ALT: 19 U/L (ref 0–44)
AST: 22 U/L (ref 15–41)
Albumin: 4.1 g/dL (ref 3.5–5.0)
Alkaline Phosphatase: 43 U/L (ref 38–126)
Anion gap: 9 (ref 5–15)
BUN: 10 mg/dL (ref 8–23)
CO2: 27 mmol/L (ref 22–32)
Calcium: 8.5 mg/dL — ABNORMAL LOW (ref 8.9–10.3)
Chloride: 101 mmol/L (ref 98–111)
Creatinine, Ser: 0.78 mg/dL (ref 0.44–1.00)
GFR, Estimated: >60 mL/min (ref >60)
Glucose, Bld: 108 mg/dL — ABNORMAL HIGH (ref 70–99)
Potassium: 3.2 mmol/L — ABNORMAL LOW (ref 3.5–5.1)
Sodium: 137 mmol/L (ref 135–145)
Total Bilirubin: 0.9 mg/dL (ref 0.3–1.2)
Total Protein: 6.6 g/dL (ref 6.5–8.1)

## 2022-07-07 LAB — MAGNESIUM: Magnesium: 2.2 mg/dL (ref 1.7–2.4)

## 2022-07-07 MED ORDER — BORTEZOMIB CHEMO SQ INJECTION 3.5 MG (2.5MG/ML)
1.0000 mg/m2 | Freq: Once | INTRAMUSCULAR | Status: AC
  Administered 2022-07-07: 1.75 mg via SUBCUTANEOUS
  Filled 2022-07-07: qty 0.7

## 2022-07-07 MED ORDER — PROCHLORPERAZINE MALEATE 10 MG PO TABS
10.0000 mg | ORAL_TABLET | Freq: Four times a day (QID) | ORAL | Status: DC | PRN
  Administered 2022-07-07: 10 mg via ORAL
  Filled 2022-07-07: qty 1

## 2022-07-07 MED ORDER — DEXAMETHASONE 4 MG PO TABS
20.0000 mg | ORAL_TABLET | Freq: Once | ORAL | Status: AC
  Administered 2022-07-07: 20 mg via ORAL
  Filled 2022-07-07: qty 5

## 2022-07-07 NOTE — Progress Notes (Signed)
Patient presents today for Velcadeand Xgeva injections.  Vital signs and labs reviewed by the MD.  Message received from Anastasio Champion RN/Dr. Delton Coombes, patient okay for Velcade injection, hold Xgeva due to up-coming dental work.    Stable during administration without incident; injection site WNL; see MAR for injection details.  Patient tolerated procedure well and without incident.  No questions or complaints noted at this time.

## 2022-07-07 NOTE — Patient Instructions (Addendum)
[place] at [facility] Discharge Instructions   You were seen and examined today by Dr. Delton Coombes.  He reviewed the results of your lab work which are normal/stable. Your multiple myeloma labs are also normal.   Continue Revlimid as prescribed.   We will proceed with your Velcade today. We will hold Xgeva (the shot for your bones) until your dental work is complete.   Return as scheduled.    Thank you for choosing [place] at [facility] to provide your oncology and hematology care.  To afford each patient quality time with our provider, please arrive at least 15 minutes before your scheduled appointment time.   If you have a lab appointment with the [facility] please come in thru the Main Entrance and check in at the main information desk.  You need to re-schedule your appointment should you arrive 10 or more minutes late.  We strive to give you quality time with our providers, and arriving late affects you and other patients whose appointments are after yours.  Also, if you no show three or more times for appointments you may be dismissed from the clinic at the providers discretion.     Again, thank you for choosing [facility].  Our hope is that these requests will decrease the amount of time that you wait before being seen by our physicians.       _____________________________________________________________  Should you have questions after your visit to [facility], please contact our office at [phone] and follow the prompts.  Our office hours are 8:00 a.m. and 4:30 p.m. Monday - Friday.  Please note that voicemails left after 4:00 p.m. may not be returned until the following business day.  We are closed weekends and major holidays.  You do have access to a nurse 24-7, just call the main number to the clinic [phone] and do not press any options, hold on the line and a nurse will answer the phone.     For prescription refill requests, have your pharmacy contact our office and allow 72 hours.    Due to Covid, you will need to wear a mask upon entering the hospital. If you do not have a mask, a mask will be given to you at the Main Entrance upon arrival. For doctor visits, patients may have 1 support person age 33 or older with them. For treatment visits, patients can not have anyone with them due to social distancing guidelines and our immunocompromised population.

## 2022-07-07 NOTE — Progress Notes (Signed)
Patient has been examined by Dr. Delton Coombes, and vital signs and labs have been reviewed. ANC, Creatinine, LFTs, hemoglobin, and platelets are within treatment parameters per M.D. - pt may proceed with treatment.  Hold Delton See d/t upcoming dental work. Primary RN and pharmacy notified.

## 2022-07-07 NOTE — Patient Instructions (Signed)
[facility]  Discharge Instructions: Thank you for choosing [facility] to provide your oncology and hematology care.  If you have a lab appointment with the [facility], please come in thru the Main Entrance and check in at the main information desk.  Wear comfortable clothing and clothing appropriate for easy access to any Portacath or PICC line.   We strive to give you quality time with your provider. You may need to reschedule your appointment if you arrive late (15 or more minutes).  Arriving late affects you and other patients whose appointments are after yours.  Also, if you miss three or more appointments without notifying the office, you may be dismissed from the clinic at the provider's discretion.      For prescription refill requests, have your pharmacy contact our office and allow 72 hours for refills to be completed.    Today you received the following chemotherapy and/or immunotherapy agents Velcade      To help prevent nausea and vomiting after your treatment, we encourage you to take your nausea medication as directed.  BELOW ARE SYMPTOMS THAT SHOULD BE REPORTED IMMEDIATELY: *FEVER GREATER THAN 100.4 F (38 C) OR HIGHER *CHILLS OR SWEATING *NAUSEA AND VOMITING THAT IS NOT CONTROLLED WITH YOUR NAUSEA MEDICATION *UNUSUAL SHORTNESS OF BREATH *UNUSUAL BRUISING OR BLEEDING *URINARY PROBLEMS (pain or burning when urinating, or frequent urination) *BOWEL PROBLEMS (unusual diarrhea, constipation, pain near the anus) TENDERNESS IN MOUTH AND THROAT WITH OR WITHOUT PRESENCE OF ULCERS (sore throat, sores in mouth, or a toothache) UNUSUAL RASH, SWELLING OR PAIN  UNUSUAL VAGINAL DISCHARGE OR ITCHING   Items with * indicate a potential emergency and should be followed up as soon as possible or go to the Emergency Department if any problems should occur.  Please show the CHEMOTHERAPY ALERT CARD or IMMUNOTHERAPY ALERT CARD at check-in to the Emergency  Department and triage nurse.  Should you have questions after your visit or need to cancel or reschedule your appointment, please contact [facility] [phone]  and follow the prompts.  Office hours are 8:00 a.m. to 4:30 p.m. Monday - Friday. Please note that voicemails left after 4:00 p.m. may not be returned until the following business day.  We are closed weekends and major holidays. You have access to a nurse at all times for urgent questions. Please call the main number to the clinic [phone] and follow the prompts.  For any non-urgent questions, you may also contact your provider using MyChart. We now offer e-Visits for anyone 37 and older to request care online for non-urgent symptoms. For details visit [email or web address].   Also download the MyChart app! Go to the app store, search "MyChart", open the app, select [facility], and log in with your MyChart username and password.

## 2022-07-07 NOTE — Progress Notes (Signed)
[facility], [place] [address]    Clinic Day:  07/07/2022  Referring physician: Alvira Monday, [place]  Patient Care Team: Alvira Monday, [place] as PCP - General (Family Medicine) Derek Jack, MD as Medical Oncologist (Oncology) Donetta Potts, RN as Oncology Nurse Navigator (Oncology) Eloise Harman, DO as Consulting Physician (Internal Medicine) Shea Evans, Norva Riffle, LCSW as [employer] (Licensed Clinical Social Worker)   ASSESSMENT & PLAN:   Assessment: 1.  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  Increased number of atypical plasma cells present 36% of all cell lines.  Plasma cells are kappa light chain restricted. -Unfortunately her specimen has reached cytogenetics and FISH lab week later due to bad weather and no viable plasma cells. -PET scan on 07/09/2019 showed no findings of active myeloma. -24-hour urine shows total protein 59 mg.  Urine immunofixation shows IgA kappa monoclonal protein.  LDH is 184. -Based on [facility] criteria she has high risk with about 45-50% probability of progression to myeloma in the next 2 years. -CTAP on 01/07/2020 shows acute superior endplate burst fracture of L1.  Mild associated paravertebral soft tissue thickening.  No other bone abnormalities. -Bone density test on 02/07/2020 shows T score -0.5. - Bone marrow biopsy on 07/12/2021: Hypercellular with increased number of atypical plasma cells representing 30% of cells, displaying kappa light chain restriction. - FISH panel: Gain of 1 q. (high risk), T p53 deletion (high risk), monosomy 13 (standard risk) - Cytogenetics: Complex karyotype. - PET scan (06/24/2021): New hypermetabolic bone lesions involving left anterior iliac crest, left mid humerus, right superior pubic ramus, bilateral mid femurs, left calvarium.  Largest lytic lesion  in the left iliac crest measuring 2.9 x 2.3 cm. - 2D echo on 07/29/2021: EF 50-55%.  There is distal septal and inferior apical hypokinesis.  (Carfilzomib based regimen was put on hold due to echo findings) - Dara VRD 6 cycles from 08/10/2021 through 11/30/2021 - Myeloma panel 12/07/2021, DIRA test was negative, M spike 0.2 g and light chain ratio normal. - Maintenance Velcade every 2 weeks and Revlimid 3 weeks on 1 week off Started on 12/20/2021   2.  Social/family history: - She uses walker to ambulate since her right knee replacement leading to stiffness.  She lives with older sister and husband who has dementia. - She is independent of ADLs and IADLs.  She also drives   Plan:  1.  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 - Reviewed myeloma labs from 06/22/2022.  M spike is not observed.  Free light chain ratio is normal.  Immunofixation was unremarkable. - Labs today showed creatinine and LFTs are normal.  Mild thrombocytopenia stable at 107.  White count and hemoglobin are within normal limits. - Continue Revlimid 10 mg 3 weeks on/1 week off.  Continue Velcade every 2 weeks.  RTC 8 weeks for follow-up with repeat myeloma labs.   2.  Electrolyte abnormalities: - Potassium is slightly low at 3.2.  HCTZ was discontinued.  Continue potassium 3 times daily.  Continue magnesium supplements.  Magnesium is normal.   3.  Pulmonary embolism (2015): - Continue Coumadin indefinitely.  No bleeding issues.  4.  ID prophylaxis: - Continue acyclovir twice daily.  5.  Myeloma bone disease: - Calcium today is 8.5.  She wants to have  dental extractions done.  We will hold denosumab until her next visit in 2 months.  6.  Right knee pain: - Continue hydrocodone 5/325 every 12 hours as needed.  No orders of the defined types were placed in this encounter.     Beverly Gust Oliver,acting as a scribe for Derek Jack, MD.,have  documented all relevant documentation on the behalf of Derek Jack, MD,as directed by  Derek Jack, MD while in the presence of Derek Jack, MD.   I, Derek Jack MD, have reviewed the above documentation for accuracy and completeness, and I agree with the above.   Doyce Loose   2/15/20241:50 PM  CHIEF COMPLAINT:   Diagnosis: multiple myeloma    Cancer Staging  No matching staging information was found for the patient.   Prior Therapy: Dara VRD 6 cycles completed on 11/30/2021    Current Therapy:  Maintenance Velcade every 2 weeks, and Revlimid 3 weeks on/1 week off     HISTORY OF PRESENT ILLNESS:   Oncology History  IgA myeloma ([place])  05/06/2021 Initial Diagnosis   IgA myeloma ([place])   08/10/2021 - 01/19/2022 Chemotherapy   Patient is on Treatment Plan : MYELOMA NEWLY DIAGNOSED TRANSPLANT CANDIDATE DaraVRd (Daratumumab SQ) q21d x 6 Cycles (Induction/Consolidation)     08/10/2021 -  Chemotherapy   Patient is on Treatment Plan : MYELOMA NEWLY DIAGNOSED TRANSPLANT CANDIDATE DaraVRd (Daratumumab SQ) q21d x 6 Cycles (Induction/Consolidation)        INTERVAL HISTORY:   Courtney Rogers is a 72 y.o. female presenting to clinic today for follow up of multiple myeloma . She was last seen by me on 05/10/2022.  Today, she states that she is doing well overall. Her appetite level is at 65%. Her energy level is at 40%. She has 5/10 right knee pain. She occasionally takes Hydrocodone for her knee pain. She received her last shot 05/25/2022. She will be getting her teeth pulled within the next couple of weeks. She is currently taking medicine prescribed by her foot doctor for her neuropathy.   PAST MEDICAL HISTORY:   Past Medical History: Past Medical History:  Diagnosis Date   Acute myocardial infarction [employer]) 2009   CAD/no stent medically managed   Anxiety disorder    CAD (coronary artery disease)    Cancer (HCC)    multiple myeloma   Hyperlipidemia     Hypertension    IBS (irritable bowel syndrome)    Non-ulcer dyspepsia 04/30/2009   Qualifier: Diagnosis of  By: Oneida Alar MD, Sandi L    Overactive bladder    PE (pulmonary embolism) 10/2012   left lung   Presence of permanent cardiac pacemaker    Sleep apnea     Surgical History: Past Surgical History:  Procedure Laterality Date   ABDOMINAL HYSTERECTOMY     BSO secondary to cyst     CARDIAC CATHETERIZATION     COLONOSCOPY  12/2009   Dr. West Carbo, propofol, normal. Next TCS 12/2019   COLONOSCOPY N/A 05/22/2017   Examined portion ileum was normal, significant looping of colon, external hemorrhoids and rectal bleeding due to internal hemorrhoids, mild diverticulosis procedure: COLONOSCOPY;  Surgeon: Danie Binder, MD;  [facility]: [facility];  Service: Endoscopy;  Laterality: N/A;  10:30am   ESOPHAGOGASTRODUODENOSCOPY  05/11/2009   schatzki ring/small hiatal hernia/path:gastritis   ESOPHAGOGASTRODUODENOSCOPY N/A 05/22/2017   Multiple gastric polyps with biopsy benign fundic gland polyp, small bowel biopsy negative for celiac, gastric biopsy with mild gastritis but no H. pylori procedure: ESOPHAGOGASTRODUODENOSCOPY (EGD);  Surgeon: Danie Binder, MD;  Location: [facility];  Service: Endoscopy;  Laterality: N/A;   ESOPHAGOGASTRODUODENOSCOPY (EGD) WITH ESOPHAGEAL DILATION N/A 04/01/2013   [id number] at the gastroesophageal juction/multiple small polyps/mild gastritis   GIVENS CAPSULE STUDY N/A 06/07/2017   Frequent gastric erosions, normal small bowel mucosa.  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    Social History: Social History   Socioeconomic History   Marital status: Married    Spouse name: Games developer   Number of children: 3   Years of education: 16    Highest education level: Not on file  Occupational History   Not on file  Tobacco Use   Smoking status: Never    Passive exposure: Never   Smokeless tobacco: Never   Tobacco comments:    Never smoked   Vaping Use   Vaping Use: Never used  Substance and Sexual Activity   Alcohol use: Not Currently    Comment: occ wine   Drug use: No   Sexual activity: Not Currently  Other Topics Concern   Not on file  Social History Narrative      Lives with husband-51 years 48 in Aug 2021   Daughter is close by, 2 sons live further away   One IN [place],ONE IN [place], [place].        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   Enjoys: reading, young adult      Diet: eats all food groups    Caffeine: coffee 1, tea daily soda-1 daily   Water: 1-2 cups      Wears seat belt    Does not use phone while driving   Smoke detectors at home    Public house manager -locked up      Left handed   One story home   Drinks caffeine   Social Determinants of Health   Financial Resource Strain: Low Risk  (11/15/2021)   Overall Financial Resource Strain (CARDIA)    Difficulty of Paying Living Expenses: Not hard at all  Food Insecurity: No Food Insecurity (11/15/2021)   Hunger Vital Sign    Worried About Running Out of Food in the Last Year: Never true    Ran Out of Food in the Last Year: Never true  Transportation Needs: No Transportation Needs (11/15/2021)   PRAPARE - Hydrologist (Medical): No    Lack of Transportation (Non-Medical): No  Physical Activity: Inactive (11/26/2021)   Exercise Vital Sign    Days of Exercise per Week: 0 days    Minutes of Exercise per Session: 0 min  Stress: Stress Concern Present (11/26/2021)   [place]    Feeling of Stress : To some extent  Social Connections: Moderately Integrated (11/11/2020)   Social Connection and Isolation Panel [NHANES]     Frequency of Communication with Friends and Family: Once a week    Frequency of Social Gatherings with Friends and Family: Once a week    Attends Religious Services: More than 4 times per year    Active Member of [employer] or Organizations: Yes    Attends Archivist Meetings: More than 4 times per year    Marital Status:  Married  Intimate Partner Violence: Not At Risk (11/11/2020)   Humiliation, Afraid, Rape, and Kick questionnaire    Fear of Current or Ex-Partner: No    Emotionally Abused: No    Physically Abused: No    Sexually Abused: No    Family History: Family History  Problem Relation Age of Onset   Colon polyps Neg Hx    Colon cancer Neg Hx     Current Medications:  Current Outpatient Medications:    acetaminophen (TYLENOL) 500 MG tablet, Take 1,000 mg by mouth 2 (two) times daily as needed for moderate pain or headache., Disp: , Rfl:    acyclovir (ZOVIRAX) 400 MG tablet, TAKE 1 TABLET BY MOUTH TWICE DAILY, Disp: 60 tablet, Rfl: 6   albuterol (VENTOLIN HFA) 108 (90 Base) MCG/ACT inhaler, USE 2 PUFFS ONE TO TWO TIMES A DAY., Disp: 18 g, Rfl: 5   amLODipine (NORVASC) 10 MG tablet, TAKE 1 TABLET BY MOUTH ONCE DAILY., Disp: 90 tablet, Rfl: 0   ascorbic acid (VITAMIN C) 500 MG tablet, Take 500 mg by mouth daily., Disp: , Rfl:    aspirin EC 81 MG tablet, Take 81 mg by mouth at bedtime., Disp: , Rfl:    B Complex Vitamins (VITAMIN B COMPLEX) TABS, Take 1 tablet by mouth daily., Disp: , Rfl:    bortezomib SQ (VELCADE) 3.5 MG injection, Inject into the skin once., Disp: , Rfl:    busPIRone (BUSPAR) 5 MG tablet, Take 1 tablet (5 mg total) by mouth daily., Disp: 30 tablet, Rfl: 3   Calcium Carbonate-Vitamin D (CALCIUM 600+D PO), Take 1 tablet by mouth 2 (two) times daily., Disp: , Rfl:    chlorthalidone (HYGROTON) 25 MG tablet, Take 25 mg by mouth daily., Disp: , Rfl:    cholecalciferol (VITAMIN D3) 10 MCG (400 UNIT) TABS tablet, Take 400 Units by mouth daily., Disp: , Rfl:     cyanocobalamin 1000 MCG tablet, Take 1 tablet by mouth daily., Disp: , Rfl:    cyclobenzaprine (FLEXERIL) 10 MG tablet, Take 1/2 tablet by mouth twice daily as needed for muscle spasms, Disp: 20 tablet, Rfl: 0   Daratumumab-Hyaluronidase-fihj (DARZALEX FASPRO [place]), Inject into the skin., Disp: , Rfl:    diclofenac Sodium (VOLTAREN) 1 % GEL, Apply 4 g topically 4 (four) times daily., Disp: 50 g, Rfl: 0   doxycycline (VIBRAMYCIN) 100 MG capsule, Take 100 mg by mouth daily., Disp: , Rfl:    fluticasone (FLONASE) 50 MCG/ACT nasal spray, USE 2 SPRAYS IN EACH NOSTRIL ONCE DAILY., Disp: 16 g, Rfl: 0   folic acid (FOLVITE) 1 MG tablet, TAKE 1 TABLET BY MOUTH ONCE DAILY., Disp: 30 tablet, Rfl: 11   gabapentin (NEURONTIN) 100 MG capsule, Take 100 mg by mouth 3 (three) times daily., Disp: , Rfl:    gabapentin (NEURONTIN) 600 MG tablet, Take by mouth., Disp: , Rfl:    hydrochlorothiazide (MICROZIDE) 12.5 MG capsule, Take 12.5 mg by mouth daily., Disp: , Rfl:    HYDROcodone-acetaminophen (NORCO/VICODIN) 5-325 MG tablet, Take 1 tablet by mouth every 8 (eight) hours as needed for moderate pain., Disp: 60 tablet, Rfl: 0   hydrOXYzine (VISTARIL) 25 MG capsule, , Disp: , Rfl:    Hyoscyamine Sulfate SL 0.125 MG SUBL, , Disp: , Rfl:    lenalidomide (REVLIMID) 10 MG capsule, Take 1 capsule (10 mg total) by mouth daily. 21 days on, 7 days off every 28 days, Disp: 21 capsule, Rfl: 10   lidocaine (HM LIDOCAINE PATCH) 4 %, Place 1 patch  onto the skin daily., Disp: 14 patch, Rfl: 0   lidocaine (XYLOCAINE) 2 % solution, Take 2 teaspoons before meals and at bedtime as needed. May repeat every 4 hours for a maximum dose of 8 per day., Disp: 300 mL, Rfl: 0   Lidocaine HCl (XOLIDO) 2 % CREA, Apply 1 application topically daily as needed (arthritisis)., Disp: , Rfl:    loperamide (IMODIUM) 2 MG capsule, Take 4 mg by mouth as needed. Take 2 tablets after the first loose stool and then 1 tablet after each loose stool.  Do not exceed  8 capsules within 24 hours., Disp: , Rfl:    magnesium oxide (MAG-OX) 400 (240 Mg) MG tablet, Take 1 tablet (400 mg total) by mouth 2 (two) times daily., Disp: 60 tablet, Rfl: 4   metoprolol tartrate (LOPRESSOR) 50 MG tablet, Take 1 tablet (50 mg total) by mouth 2 (two) times daily. (Patient taking differently: Take 25 mg by mouth 2 (two) times daily.), Disp: 180 tablet, Rfl: 1   mirtazapine (REMERON) 15 MG tablet, Take 15 mg by mouth at bedtime., Disp: , Rfl:    montelukast (SINGULAIR) 10 MG tablet, Take 10 mg by mouth daily. , Disp: , Rfl:    MYRBETRIQ 25 MG TB24 tablet, TAKE ONE TABLET BY MOUTH ONCE DAILY, Disp: 30 tablet, Rfl: 0   nitroGLYCERIN (NITROSTAT) 0.4 MG SL tablet, Place 0.4 mg under the tongue every 5 (five) minutes as needed for chest pain., Disp: , Rfl:    pantoprazole (PROTONIX) 40 MG tablet, Take 40 mg by mouth daily., Disp: , Rfl:    potassium chloride SA (KLOR-CON M) 20 MEQ tablet, Take 1 tablet (20 mEq total) by mouth 3 (three) times daily., Disp: 21 tablet, Rfl: 0   primidone (MYSOLINE) 50 MG tablet, Take 75 mg by mouth daily., Disp: , Rfl:    Probiotic Product (GNP PROBIOTIC COLON SUPPORT) CAPS, Take 1 capsule by mouth daily., Disp: , Rfl:    sertraline (ZOLOFT) 100 MG tablet, Take 200 mg by mouth daily. , Disp: , Rfl:    simvastatin (ZOCOR) 40 MG tablet, Take 1 tablet (40 mg total) by mouth at bedtime., Disp: 90 tablet, Rfl: 1   tadalafil, PAH, (ALYQ) 20 MG tablet, Take 2 tablets every day by oral route., Disp: , Rfl:    traMADol (ULTRAM) 50 MG tablet, Take 50 mg by mouth 2 (two) times daily., Disp: , Rfl:    trimethoprim-polymyxin b (POLYTRIM) ophthalmic solution, INSTILL 1 DROP INTO AFFECTED EYE(S) BY OPHTHALMIC ROUTE EVERY 6 HOURS FOR 7 DAYS, Disp: , Rfl:    warfarin (COUMADIN) 3 MG tablet, Take 3 mg by mouth daily., Disp: , Rfl:    losartan (COZAAR) 100 MG tablet, Take 1 tablet (100 mg total) by mouth daily., Disp: 30 tablet, Rfl: 3 No current facility-administered  medications for this visit.  Facility-Administered Medications Ordered in Other Visits:    bortezomib SQ (VELCADE) chemo injection (2.42m/mL concentration) 1.75 mg, 1 mg/m2 (Treatment Plan Recorded), Subcutaneous, Once, KDerek Jack MD   dexamethasone (DECADRON) tablet 20 mg, 20 mg, Oral, Once, KDerek Jack MD   prochlorperazine (COMPAZINE) tablet 10 mg, 10 mg, Oral, Q6H PRN, KDerek Jack MD   Allergies: Allergies  Allergen Reactions   Amoxicillin-Pot Clavulanate Other (See Comments)   Biaxin [Clarithromycin] Other (See Comments)    Stomach problems    Erythromycin    Lisinopril Swelling   Amoxicillin Diarrhea    REVIEW OF SYSTEMS:   Review of Systems  Constitutional:  Negative for chills,  fatigue and fever.  HENT:   Negative for lump/mass, mouth sores, nosebleeds, sore throat and trouble swallowing.   Eyes:  Negative for eye problems.  Respiratory:  Negative for cough and shortness of breath.   Cardiovascular:  Negative for chest pain, leg swelling and palpitations.  Gastrointestinal:  Positive for diarrhea. Negative for abdominal pain, constipation, nausea and vomiting.  Genitourinary:  Negative for bladder incontinence, difficulty urinating, dysuria, frequency, hematuria and nocturia.   Musculoskeletal:  Negative for arthralgias, back pain, flank pain, myalgias and neck pain.  Skin:  Negative for itching and rash.  Neurological:  Positive for light-headedness and numbness (Tingles in the feet). Negative for dizziness and headaches.  Hematological:  Does not bruise/bleed easily.  Psychiatric/Behavioral:  Negative for depression, sleep disturbance and suicidal ideas. The patient is nervous/anxious.   All other systems reviewed and are negative.    VITALS:   Blood pressure 128/73, pulse 69, resp. rate 18, height 5' 3"$  (1.6 m), weight 159 lb (72.1 kg), SpO2 100 %.  Wt Readings from Last 3 Encounters:  07/07/22 159 lb (72.1 kg)  06/23/22 164 lb 0.6 oz  (74.4 kg)  06/22/22 164 lb (74.4 kg)    Body mass index is 28.17 kg/m.  Performance status (ECOG): 1 - Symptomatic but completely ambulatory  PHYSICAL EXAM:   Physical Exam Vitals reviewed.  Constitutional:      Appearance: Normal appearance.  Cardiovascular:     Rate and Rhythm: Normal rate and regular rhythm.     Heart sounds: Normal heart sounds.  Pulmonary:     Effort: Pulmonary effort is normal.     Breath sounds: Normal breath sounds.  Neurological:     Mental Status: She is alert.  Psychiatric:        Mood and Affect: Mood normal.        Behavior: Behavior normal.     LABS:      Latest Ref Rng & Units 07/07/2022   12:00 PM 06/22/2022   12:42 PM 06/08/2022   12:34 PM  CBC  WBC 4.0 - 10.5 K/uL 4.2  4.4  3.5   Hemoglobin 12.0 - 15.0 g/dL 11.9  11.8  11.7   Hematocrit 36.0 - 46.0 % 35.9  34.8  35.9   Platelets 150 - 400 K/uL 107  106  94       Latest Ref Rng & Units 07/07/2022   12:00 PM 06/22/2022   12:42 PM 06/08/2022   12:34 PM  CMP  Glucose 70 - 99 mg/dL 108  74  81   BUN 8 - 23 mg/dL 10  11  15   $ Creatinine 0.44 - 1.00 mg/dL 0.78  0.69  0.73   Sodium 135 - 145 mmol/L 137  140  139   Potassium 3.5 - 5.1 mmol/L 3.2  3.0  3.5   Chloride 98 - 111 mmol/L 101  101  102   CO2 22 - 32 mmol/L 27  31  30   $ Calcium 8.9 - 10.3 mg/dL 8.5  9.1  8.5   Total Protein 6.5 - 8.1 g/dL 6.6  6.6  6.6   Total Bilirubin 0.3 - 1.2 mg/dL 0.9  0.8  0.7   Alkaline Phos 38 - 126 U/L 43  51  47   AST 15 - 41 U/L 22  25  26   $ ALT 0 - 44 U/L 19  18  22      $ No results found for: "CEA1", "CEA" / No results found for: "CEA1", "  CEA" No results found for: "PSA1" No results found for: "CAN199" No results found for: "CAN125"  Lab Results  Component Value Date   TOTALPROTELP 5.8 (L) 06/22/2022   TOTALPROTELP 5.9 (L) 06/22/2022   ALBUMINELP 3.8 06/22/2022   A1GS 0.3 06/22/2022   A2GS 0.7 06/22/2022   BETS 0.8 06/22/2022   GAMS 0.4 06/22/2022   MSPIKE Not Observed 06/22/2022    SPEI Comment 06/22/2022   Lab Results  Component Value Date   TIBC 291 06/09/2021   TIBC 297 01/21/2021   TIBC 302 10/21/2020   FERRITIN 279 06/09/2021   FERRITIN 216 01/21/2021   FERRITIN 274 10/21/2020   IRONPCTSAT 24 06/09/2021   IRONPCTSAT 19 01/21/2021   IRONPCTSAT 28 10/21/2020   Lab Results  Component Value Date   LDH 129 03/02/2022   LDH 121 12/14/2021   LDH 113 11/09/2021     STUDIES:   No results found.

## 2022-07-11 NOTE — Progress Notes (Signed)
Call received today from Santiago Glad at [facility]. Patient is scheduled for 3 tooth extractions on August 10, 2022. Last dose Xgeva on May 25, 2022. Patient to continue Dara as planned with no interruptions. Patient to have Xgeva held until at least May (6 weeks after extractions) per Dr. Delton Coombes. I have called [facility] and made them aware of the above.

## 2022-07-21 ENCOUNTER — Inpatient Hospital Stay: Payer: Medicare Other

## 2022-07-21 ENCOUNTER — Encounter: Payer: Self-pay | Admitting: Radiology

## 2022-07-21 VITALS — BP 125/73 | HR 88 | Temp 98.0°F | Resp 18 | Wt 160.6 lb

## 2022-07-21 DIAGNOSIS — C9000 Multiple myeloma not having achieved remission: Secondary | ICD-10-CM

## 2022-07-21 LAB — COMPREHENSIVE METABOLIC PANEL
ALT: 19 U/L (ref 0–44)
AST: 23 U/L (ref 15–41)
Albumin: 3.9 g/dL (ref 3.5–5.0)
Alkaline Phosphatase: 50 U/L (ref 38–126)
Anion gap: 7 (ref 5–15)
BUN: 12 mg/dL (ref 8–23)
CO2: 27 mmol/L (ref 22–32)
Calcium: 8.4 mg/dL — ABNORMAL LOW (ref 8.9–10.3)
Chloride: 104 mmol/L (ref 98–111)
Creatinine, Ser: 0.68 mg/dL (ref 0.44–1.00)
GFR, Estimated: >60 mL/min (ref >60)
Glucose, Bld: 107 mg/dL — ABNORMAL HIGH (ref 70–99)
Potassium: 3.1 mmol/L — ABNORMAL LOW (ref 3.5–5.1)
Sodium: 138 mmol/L (ref 135–145)
Total Bilirubin: 0.6 mg/dL (ref 0.3–1.2)
Total Protein: 6.4 g/dL — ABNORMAL LOW (ref 6.5–8.1)

## 2022-07-21 LAB — CBC WITH DIFFERENTIAL/PLATELET
Abs Immature Granulocytes: 0.01 10*3/uL (ref 0.00–0.07)
Basophils Absolute: 0.1 10*3/uL (ref 0.0–0.1)
Basophils Relative: 2 %
Eosinophils Absolute: 0.2 10*3/uL (ref 0.0–0.5)
Eosinophils Relative: 5 %
HCT: 34.9 % — ABNORMAL LOW (ref 36.0–46.0)
Hemoglobin: 11.6 g/dL — ABNORMAL LOW (ref 12.0–15.0)
Immature Granulocytes: 0 %
Lymphocytes Relative: 22 %
Lymphs Abs: 0.8 10*3/uL (ref 0.7–4.0)
MCH: 33.8 pg (ref 26.0–34.0)
MCHC: 33.2 g/dL (ref 30.0–36.0)
MCV: 101.7 fL — ABNORMAL HIGH (ref 80.0–100.0)
Monocytes Absolute: 0.2 10*3/uL (ref 0.1–1.0)
Monocytes Relative: 6 %
Neutro Abs: 2.4 10*3/uL (ref 1.7–7.7)
Neutrophils Relative %: 65 %
Platelets: 100 10*3/uL — ABNORMAL LOW (ref 150–400)
RBC: 3.43 MIL/uL — ABNORMAL LOW (ref 3.87–5.11)
RDW: 16.1 % — ABNORMAL HIGH (ref 11.5–15.5)
WBC: 3.8 10*3/uL — ABNORMAL LOW (ref 4.0–10.5)
nRBC: 0 % (ref 0.0–0.2)

## 2022-07-21 LAB — MAGNESIUM: Magnesium: 1.9 mg/dL (ref 1.7–2.4)

## 2022-07-21 MED ORDER — PROCHLORPERAZINE MALEATE 10 MG PO TABS
10.0000 mg | ORAL_TABLET | Freq: Once | ORAL | Status: AC
  Administered 2022-07-21: 10 mg via ORAL
  Filled 2022-07-21: qty 1

## 2022-07-21 MED ORDER — BORTEZOMIB CHEMO SQ INJECTION 3.5 MG (2.5MG/ML)
1.0000 mg/m2 | Freq: Once | INTRAMUSCULAR | Status: AC
  Administered 2022-07-21: 1.75 mg via SUBCUTANEOUS
  Filled 2022-07-21: qty 0.7

## 2022-07-21 MED ORDER — POTASSIUM CHLORIDE CRYS ER 20 MEQ PO TBCR
40.0000 meq | EXTENDED_RELEASE_TABLET | Freq: Once | ORAL | Status: AC
  Administered 2022-07-21: 40 meq via ORAL
  Filled 2022-07-21: qty 2

## 2022-07-21 NOTE — Patient Instructions (Signed)
Courtney Rogers  Discharge Instructions: Thank you for choosing [facility] to provide your oncology and hematology care.  If you have a lab appointment with the [facility], please come in thru the Main Entrance and check in at the main information desk.  Wear comfortable clothing and clothing appropriate for easy access to any Portacath or PICC line.   We strive to give you quality time with your provider. You may need to reschedule your appointment if you arrive late (15 or more minutes).  Arriving late affects you and other patients whose appointments are after yours.  Also, if you miss three or more appointments without notifying the office, you may be dismissed from the clinic at the provider's discretion.      For prescription refill requests, have your pharmacy contact our office and allow 72 hours for refills to be completed.    Today you received the following chemotherapy and/or immunotherapy agents Velcade .  Lenalidomide Capsules What is this medication? LENALIDOMIDE (len a LID oh mide) treats blood and bone marrow cancers. It works by slowing down the growth of cancer cells. This medicine may be used for other purposes; ask your health care provider or pharmacist if you have questions. COMMON BRAND NAME(S): Revlimid What should I tell my care team before I take this medication? They need to know if you have any of these conditions: Blood clots High blood pressure High cholesterol Kidney disease Lactose intolerant Liver disease Thyroid disease Tobacco use An unusual or allergic reaction to lenalidomide, thalidomide, other medications, foods, dyes, or preservatives Pregnant or trying to get pregnant Breast-feeding How should I use this medication? Take this medication by mouth with water. Take it as directed on the prescription label at the same time every day. Do not cut, crush, or chew this medication. Swallow the capsules whole. You can  take it with or without food. If it upsets your stomach, take it with food. Keep taking it unless your care team tells you to stop. A special MedGuide will be given to you by the pharmacist with each prescription and refill. Be sure to read this information carefully each time. Talk to your care team about the use of this medication in children. Special care may be needed. Overdosage: If you think you have taken too much of this medicine contact a poison control center or emergency room at once. NOTE: This medicine is only for you. Do not share this medicine with others. What if I miss a dose? If you miss a dose, take it as soon as you can unless it is more than 12 hours late. If it is more than 12 hours late, skip the missed dose. Take the next dose at the normal time. Do not take double or extra doses. What may interact with this medication? This medication may interact with the following: Digoxin Estrogen hormones Medications that help the body make more red blood cells, such as epoetin alfa or darbepoetin alfa Warfarin This list may not describe all possible interactions. Give your health care provider a list of all the medicines, herbs, non-prescription drugs, or dietary supplements you use. Also tell them if you smoke, drink alcohol, or use illegal drugs. Some items may interact with your medicine. What should I watch for while using this medication? Visit your care team for regular checks on your progress. You may need blood work done while taking this medication. This medication may cause serious skin reactions. They can happen weeks  to months after starting the medication. Contact your care team right away if you notice fevers or flu-like symptoms with a rash. The rash may be red or purple and then turn into blisters or peeling of the skin. You may also notice a red rash with swelling of the face, lips, or lymph nodes in your neck or under your arms. Talk to your care team about your risk  of cancer. You may be more at risk for certain types of cancers if you take this medication. Talk to your care team if you or your partner wish to become pregnant or think either of you might be pregnant. This medication can cause serious birth defects if taken during pregnancy or for 4 weeks after stopping treatment. Two negative pregnancy tests are required before starting this medication. A negative pregnancy test is also required every 2 to 4 weeks during treatment, even if you are not sexually active. Two reliable forms of contraception are recommended while you are taking this medication and for 4 weeks after stopping treatment. Talk to your care team about effective forms of contraception. If you become pregnant, miss a menstrual cycle, or stop using contraception, stop taking this medication. Call your care team. Severe birth defects may occur even if just 1 dose is taken. Do not breastfeed while taking this medication. Talk to your care team about breastfeeding. Changes to your treatment plan may be needed. If your partner can get pregnant, use a condom during sex while taking this medication and for 4 weeks after the last dose. Tell your care team right away if you think your partner might be pregnant. This medication can cause serious birth defects. Do not donate sperm while taking this medication and for 4 weeks after the last dose. Do not donate blood while you are talking this medication or for 4 weeks after stopping it. Donated blood may contain enough of this medication to cause birth defects in a fetus if transfused to someone who is pregnant. What side effects may I notice from receiving this medication? Side effects that you should report to your care team as soon as possible: Allergic reactions--skin rash, itching, hives, swelling of the face, lips, tongue, or throat Blood clot--pain, swelling, or warmth in the leg, shortness of breath, chest pain Change in your skin, such as a new  growth, a sore that doesn't heal, or a change in a mole Heart attack--pain or tightness in the chest, shoulders, arms, or jaw, nausea, shortness of breath, cold or clammy skin, feeling faint or lightheaded High thyroid levels (hyperthyroidism)--fast or irregular heartbeat, weight loss, excessive sweating or sensitivity to heat, tremors or shaking, anxiety, nervousness, irregular menstrual cycle or spotting Infection--fever, chills, cough, or sore throat Liver injury--right upper belly pain, loss of appetite, nausea, light-colored stool, dark yellow or brown urine, yellowing skin or eyes, unusual weakness or fatigue Low thyroid levels (hypothyroidism)--unusual weakness or fatigue, increased sensitivity to cold, constipation, hair loss, dry skin, weight gain, feelings of depression Rash, fever, and swollen lymph nodes Redness, blistering, peeling, or loosening of the skin, including inside the mouth Stroke--sudden numbness or weakness of the face, arm, or leg, trouble speaking, confusion, trouble walking, loss of balance or coordination, dizziness, severe headache, change in vision Tumor lysis syndrome (TLS)--nausea, vomiting, diarrhea, decrease in the amount of urine, dark urine, unusual weakness or fatigue, confusion, muscle pain or cramps, fast or irregular heartbeat, joint pain Unusual bruising or bleeding Side effects that usually do not require medical attention (report  to your care team if they continue or are bothersome): Back pain Cough Diarrhea Fatigue Headache Nausea This list may not describe all possible side effects. Call your doctor for medical advice about side effects. You may report side effects to FDA at 1-800-FDA-1088. Where should I keep my medication? Keep out of the reach of children and pets. Store at room temperature between 20 and 25 degrees C (68 and 77 degrees F). Get rid of any unused medication after the expiration date. It is important to get rid of the medication  as soon as you no longer need it or it is expired. You can do this in two ways: Take the medication to a medication take-back program. Check with your pharmacy or law enforcement to find a location. If you cannot return the medication, follow the directions in the [facility]. NOTE: This sheet is a summary. It may not cover all possible information. If you have questions about this medicine, talk to your doctor, pharmacist, or health care provider.  2023 Elsevier/Gold Standard (2021-09-17 00:00:00) Bortezomib Injection What is this medication? BORTEZOMIB (bor TEZ oh mib) treats lymphoma. It may also be used to treat multiple myeloma, a type of bone marrow cancer. It works by blocking a protein that causes cancer cells to grow and multiply. This helps to slow or stop the spread of cancer cells. This medicine may be used for other purposes; ask your health care provider or pharmacist if you have questions. COMMON BRAND NAME(S): Velcade What should I tell my care team before I take this medication? They need to know if you have any of these conditions: Dehydration Diabetes Heart disease Liver disease Tingling of the fingers or toes or other nerve disorder An unusual or allergic reaction to bortezomib, other medications, foods, dyes, or preservatives If you or your partner are pregnant or trying to get pregnant Breastfeeding How should I use this medication? This medication is injected into a vein or under the skin. It is given by your care team in a hospital or clinic setting. Talk to your care team about the use of this medication in children. Special care may be needed. Overdosage: If you think you have taken too much of this medicine contact a poison control center or emergency room at once. NOTE: This medicine is only for you. Do not share this medicine with others. What if I miss a dose? Keep appointments for follow-up doses. It is important not to miss your dose. Call your care team if you  are unable to keep an appointment. What may interact with this medication? Ketoconazole Rifampin This list may not describe all possible interactions. Give your health care provider a list of all the medicines, herbs, non-prescription drugs, or dietary supplements you use. Also tell them if you smoke, drink alcohol, or use illegal drugs. Some items may interact with your medicine. What should I watch for while using this medication? Your condition will be monitored carefully while you are receiving this medication. You may need blood work while taking this medication. This medication may affect your coordination, reaction time, or judgment. Do not drive or operate machinery until you know how this medication affects you. Sit up or stand slowly to reduce the risk of dizzy or fainting spells. Drinking alcohol with this medication can increase the risk of these side effects. This medication may increase your risk of getting an infection. Call your care team for advice if you get a fever, chills, sore throat, or other symptoms of a  cold or flu. Do not treat yourself. Try to avoid being around people who are sick. Check with your care team if you have severe diarrhea, nausea, and vomiting, or if you sweat a lot. The loss of too much body fluid may make it dangerous for you to take this medication. Talk to your care team if you may be pregnant. Serious birth defects can occur if you take this medication during pregnancy and for 7 months after the last dose. You will need a negative pregnancy test before starting this medication. Contraception is recommended while taking this medication and for 7 months after the last dose. Your care team can help you find the option that works for you. If your partner can get pregnant, use a condom during sex while taking this medication and for 4 months after the last dose. Do not breastfeed while taking this medication and for 2 months after the last dose. This medication  may cause infertility. Talk to your care team if you are concerned about your fertility. What side effects may I notice from receiving this medication? Side effects that you should report to your care team as soon as possible: Allergic reactions--skin rash, itching, hives, swelling of the face, lips, tongue, or throat Bleeding--bloody or black, tar-like stools, vomiting blood or brown material that looks like coffee grounds, red or dark brown urine, small red or purple spots on skin, unusual bruising or bleeding Bleeding in the brain--severe headache, stiff neck, confusion, dizziness, change in vision, numbness or weakness of the face, arm, or leg, trouble speaking, trouble walking, vomiting Bowel blockage--stomach cramping, unable to have a bowel movement or pass gas, loss of appetite, vomiting Heart failure--shortness of breath, swelling of the ankles, feet, or hands, sudden weight gain, unusual weakness or fatigue Infection--fever, chills, cough, sore throat, wounds that don't heal, pain or trouble when passing urine, general feeling of discomfort or being unwell Liver injury--right upper belly pain, loss of appetite, nausea, light-colored stool, dark yellow or brown urine, yellowing skin or eyes, unusual weakness or fatigue Low blood pressure--dizziness, feeling faint or lightheaded, blurry vision Lung injury--shortness of breath or trouble breathing, cough, spitting up blood, chest pain, fever Pain, tingling, or numbness in the hands or feet Severe or prolonged diarrhea Stomach pain, bloody diarrhea, pale skin, unusual weakness or fatigue, decrease in the amount of urine, which may be signs of hemolytic uremic syndrome Sudden and severe headache, confusion, change in vision, seizures, which may be signs of posterior reversible encephalopathy syndrome (PRES) TTP--purple spots on the skin or inside the mouth, pale skin, yellowing skin or eyes, unusual weakness or fatigue, fever, fast or irregular  heartbeat, confusion, change in vision, trouble speaking, trouble walking Tumor lysis syndrome (TLS)--nausea, vomiting, diarrhea, decrease in the amount of urine, dark urine, unusual weakness or fatigue, confusion, muscle pain or cramps, fast or irregular heartbeat, joint pain Side effects that usually do not require medical attention (report to your care team if they continue or are bothersome): Constipation Diarrhea Fatigue Loss of appetite Nausea This list may not describe all possible side effects. Call your doctor for medical advice about side effects. You may report side effects to FDA at 1-800-FDA-1088. Where should I keep my medication? This medication is given in a hospital or clinic. It will not be stored at home. NOTE: This sheet is a summary. It may not cover all possible information. If you have questions about this medicine, talk to your doctor, pharmacist, or health care provider.  2023 Elsevier/Gold  Standard (2021-10-06 00:00:00)       To help prevent nausea and vomiting after your treatment, we encourage you to take your nausea medication as directed.  BELOW ARE SYMPTOMS THAT SHOULD BE REPORTED IMMEDIATELY: *FEVER GREATER THAN 100.4 F (38 C) OR HIGHER *CHILLS OR SWEATING *NAUSEA AND VOMITING THAT IS NOT CONTROLLED WITH YOUR NAUSEA MEDICATION *UNUSUAL SHORTNESS OF BREATH *UNUSUAL BRUISING OR BLEEDING *URINARY PROBLEMS (pain or burning when urinating, or frequent urination) *BOWEL PROBLEMS (unusual diarrhea, constipation, pain near the anus) TENDERNESS IN MOUTH AND THROAT WITH OR WITHOUT PRESENCE OF ULCERS (sore throat, sores in mouth, or a toothache) UNUSUAL RASH, SWELLING OR PAIN  UNUSUAL VAGINAL DISCHARGE OR ITCHING   Items with * indicate a potential emergency and should be followed up as soon as possible or go to the Emergency Department if any problems should occur.  Please show the CHEMOTHERAPY ALERT CARD or IMMUNOTHERAPY ALERT CARD at check-in to the Emergency  Department and triage nurse.  Should you have questions after your visit or need to cancel or reschedule your appointment, please contact [facility] [phone]  and follow the prompts.  Office hours are 8:00 a.m. to 4:30 p.m. Monday - Friday. Please note that voicemails left after 4:00 p.m. may not be returned until the following business day.  We are closed weekends and major holidays. You have access to a nurse at all times for urgent questions. Please call the main number to the clinic [phone] and follow the prompts.  For any non-urgent questions, you may also contact your provider using MyChart. We now offer e-Visits for anyone 13 and older to request care online for non-urgent symptoms. For details visit [email or web address].   Also download the MyChart app! Go to the app store, search "MyChart", open the app, select [facility], and log in with your MyChart username and password.

## 2022-07-21 NOTE — Progress Notes (Signed)
Patient presents for Velcade injection today. Potassium 3.1. Message received from Dr. Delton Coombes / A.Anderson RN to proceed with treatment. Give 40 mEq of K-DUR x 1 dose today. Labs within parameters for treatment. Vital signs within parameters for treatment. Patient states she is taking Potassium at home as directed. Patient taking Revlimid as prescribed with no complaints of any side effects related to tx.   Treatment given today per MD orders. Tolerated injection without adverse affects. Vital signs stable. No complaints at this time. Discharged from clinic ambulatory in stable condition. Alert and oriented x 3. F/U with [facility] as scheduled.

## 2022-07-26 ENCOUNTER — Other Ambulatory Visit: Payer: Self-pay | Admitting: Family Medicine

## 2022-07-26 DIAGNOSIS — R32 Unspecified urinary incontinence: Secondary | ICD-10-CM

## 2022-08-03 ENCOUNTER — Inpatient Hospital Stay: Payer: Medicare Other

## 2022-08-03 ENCOUNTER — Inpatient Hospital Stay: Payer: Medicare Other | Attending: Hematology

## 2022-08-03 VITALS — BP 96/73 | HR 85 | Temp 98.0°F | Resp 18 | Wt 161.6 lb

## 2022-08-03 DIAGNOSIS — Z5112 Encounter for antineoplastic immunotherapy: Secondary | ICD-10-CM | POA: Insufficient documentation

## 2022-08-03 DIAGNOSIS — C9000 Multiple myeloma not having achieved remission: Secondary | ICD-10-CM | POA: Diagnosis present

## 2022-08-03 DIAGNOSIS — E876 Hypokalemia: Secondary | ICD-10-CM

## 2022-08-03 LAB — CBC WITH DIFFERENTIAL/PLATELET
Abs Immature Granulocytes: 0.01 10*3/uL (ref 0.00–0.07)
Basophils Absolute: 0 10*3/uL (ref 0.0–0.1)
Basophils Relative: 1 %
Eosinophils Absolute: 0.3 10*3/uL (ref 0.0–0.5)
Eosinophils Relative: 9 %
HCT: 32.2 % — ABNORMAL LOW (ref 36.0–46.0)
Hemoglobin: 10.7 g/dL — ABNORMAL LOW (ref 12.0–15.0)
Immature Granulocytes: 0 %
Lymphocytes Relative: 23 %
Lymphs Abs: 0.8 10*3/uL (ref 0.7–4.0)
MCH: 34.1 pg — ABNORMAL HIGH (ref 26.0–34.0)
MCHC: 33.2 g/dL (ref 30.0–36.0)
MCV: 102.5 fL — ABNORMAL HIGH (ref 80.0–100.0)
Monocytes Absolute: 0.4 10*3/uL (ref 0.1–1.0)
Monocytes Relative: 12 %
Neutro Abs: 1.9 10*3/uL (ref 1.7–7.7)
Neutrophils Relative %: 55 %
Platelets: 74 10*3/uL — ABNORMAL LOW (ref 150–400)
RBC: 3.14 MIL/uL — ABNORMAL LOW (ref 3.87–5.11)
RDW: 16 % — ABNORMAL HIGH (ref 11.5–15.5)
WBC: 3.5 10*3/uL — ABNORMAL LOW (ref 4.0–10.5)
nRBC: 0 % (ref 0.0–0.2)

## 2022-08-03 LAB — COMPREHENSIVE METABOLIC PANEL
ALT: 18 U/L (ref 0–44)
AST: 23 U/L (ref 15–41)
Albumin: 3.8 g/dL (ref 3.5–5.0)
Alkaline Phosphatase: 49 U/L (ref 38–126)
Anion gap: 11 (ref 5–15)
BUN: 16 mg/dL (ref 8–23)
CO2: 24 mmol/L (ref 22–32)
Calcium: 7.5 mg/dL — ABNORMAL LOW (ref 8.9–10.3)
Chloride: 104 mmol/L (ref 98–111)
Creatinine, Ser: 0.76 mg/dL (ref 0.44–1.00)
GFR, Estimated: >60 mL/min (ref >60)
Glucose, Bld: 101 mg/dL — ABNORMAL HIGH (ref 70–99)
Potassium: 3.1 mmol/L — ABNORMAL LOW (ref 3.5–5.1)
Sodium: 139 mmol/L (ref 135–145)
Total Bilirubin: 0.4 mg/dL (ref 0.3–1.2)
Total Protein: 6.2 g/dL — ABNORMAL LOW (ref 6.5–8.1)

## 2022-08-03 LAB — MAGNESIUM: Magnesium: 2.1 mg/dL (ref 1.7–2.4)

## 2022-08-03 MED ORDER — POTASSIUM CHLORIDE CRYS ER 20 MEQ PO TBCR
40.0000 meq | EXTENDED_RELEASE_TABLET | Freq: Once | ORAL | Status: AC
  Administered 2022-08-03: 40 meq via ORAL
  Filled 2022-08-03: qty 2

## 2022-08-03 MED ORDER — PROCHLORPERAZINE MALEATE 10 MG PO TABS
10.0000 mg | ORAL_TABLET | Freq: Four times a day (QID) | ORAL | Status: DC | PRN
  Administered 2022-08-03: 10 mg via ORAL
  Filled 2022-08-03: qty 1

## 2022-08-03 MED ORDER — BORTEZOMIB CHEMO SQ INJECTION 3.5 MG (2.5MG/ML)
1.0000 mg/m2 | Freq: Once | INTRAMUSCULAR | Status: AC
  Administered 2022-08-03: 1.75 mg via SUBCUTANEOUS
  Filled 2022-08-03: qty 0.7

## 2022-08-03 MED ORDER — DEXAMETHASONE 4 MG PO TABS
20.0000 mg | ORAL_TABLET | Freq: Once | ORAL | Status: AC
  Administered 2022-08-03: 20 mg via ORAL
  Filled 2022-08-03: qty 5

## 2022-08-03 NOTE — Patient Instructions (Signed)
[facility] [place]  Discharge Instructions: Thank you for choosing [facility] to provide your oncology and hematology care.  If you have a lab appointment with the Cancer Center, please come in thru the Main Entrance and check in at the main information desk.  Wear comfortable clothing and clothing appropriate for easy access to any Portacath or PICC line.   We strive to give you quality time with your provider. You may need to reschedule your appointment if you arrive late (15 or more minutes).  Arriving late affects you and other patients whose appointments are after yours.  Also, if you miss three or more appointments without notifying the office, you may be dismissed from the clinic at the provider's discretion.      For prescription refill requests, have your pharmacy contact our office and allow 72 hours for refills to be completed.    Today you received the following chemotherapy and/or immunotherapy agents Velcade.  Bortezomib Injection What is this medication? BORTEZOMIB (bor TEZ oh mib) treats lymphoma. It may also be used to treat multiple myeloma, a type of bone marrow cancer. It works by blocking a protein that causes cancer cells to grow and multiply. This helps to slow or stop the spread of cancer cells. This medicine may be used for other purposes; ask your health care provider or pharmacist if you have questions. COMMON BRAND NAME(S): Velcade What should I tell my care team before I take this medication? They need to know if you have any of these conditions: Dehydration Diabetes Heart disease Liver disease Tingling of the fingers or toes or other nerve disorder An unusual or allergic reaction to bortezomib, other medications, foods, dyes, or preservatives If you or your partner are pregnant or trying to get pregnant Breastfeeding How should I use this medication? This medication is injected into a vein or under the skin. It is given by your  care team in a hospital or clinic setting. Talk to your care team about the use of this medication in children. Special care may be needed. Overdosage: If you think you have taken too much of this medicine contact a poison control center or emergency room at once. NOTE: This medicine is only for you. Do not share this medicine with others. What if I miss a dose? Keep appointments for follow-up doses. It is important not to miss your dose. Call your care team if you are unable to keep an appointment. What may interact with this medication? Ketoconazole Rifampin This list may not describe all possible interactions. Give your health care provider a list of all the medicines, herbs, non-prescription drugs, or dietary supplements you use. Also tell them if you smoke, drink alcohol, or use illegal drugs. Some items may interact with your medicine. What should I watch for while using this medication? Your condition will be monitored carefully while you are receiving this medication. You may need blood work while taking this medication. This medication may affect your coordination, reaction time, or judgment. Do not drive or operate machinery until you know how this medication affects you. Sit up or stand slowly to reduce the risk of dizzy or fainting spells. Drinking alcohol with this medication can increase the risk of these side effects. This medication may increase your risk of getting an infection. Call your care team for advice if you get a fever, chills, sore throat, or other symptoms of a cold or flu. Do not treat yourself. Try to avoid being around   people who are sick. Check with your care team if you have severe diarrhea, nausea, and vomiting, or if you sweat a lot. The loss of too much body fluid may make it dangerous for you to take this medication. Talk to your care team if you may be pregnant. Serious birth defects can occur if you take this medication during pregnancy and for 7 months after the  last dose. You will need a negative pregnancy test before starting this medication. Contraception is recommended while taking this medication and for 7 months after the last dose. Your care team can help you find the option that works for you. If your partner can get pregnant, use a condom during sex while taking this medication and for 4 months after the last dose. Do not breastfeed while taking this medication and for 2 months after the last dose. This medication may cause infertility. Talk to your care team if you are concerned about your fertility. What side effects may I notice from receiving this medication? Side effects that you should report to your care team as soon as possible: Allergic reactions--skin rash, itching, hives, swelling of the face, lips, tongue, or throat Bleeding--bloody or black, tar-like stools, vomiting blood or Anayely Constantine material that looks like coffee grounds, red or dark Oaklen Thiam urine, small red or purple spots on skin, unusual bruising or bleeding Bleeding in the brain--severe headache, stiff neck, confusion, dizziness, change in vision, numbness or weakness of the face, arm, or leg, trouble speaking, trouble walking, vomiting Bowel blockage--stomach cramping, unable to have a bowel movement or pass gas, loss of appetite, vomiting Heart failure--shortness of breath, swelling of the ankles, feet, or hands, sudden weight gain, unusual weakness or fatigue Infection--fever, chills, cough, sore throat, wounds that don't heal, pain or trouble when passing urine, general feeling of discomfort or being unwell Liver injury--right upper belly pain, loss of appetite, nausea, light-colored stool, dark yellow or Cornelious Bartolucci urine, yellowing skin or eyes, unusual weakness or fatigue Low blood pressure--dizziness, feeling faint or lightheaded, blurry vision Lung injury--shortness of breath or trouble breathing, cough, spitting up blood, chest pain, fever Pain, tingling, or numbness in the hands  or feet Severe or prolonged diarrhea Stomach pain, bloody diarrhea, pale skin, unusual weakness or fatigue, decrease in the amount of urine, which may be signs of hemolytic uremic syndrome Sudden and severe headache, confusion, change in vision, seizures, which may be signs of posterior reversible encephalopathy syndrome (PRES) TTP--purple spots on the skin or inside the mouth, pale skin, yellowing skin or eyes, unusual weakness or fatigue, fever, fast or irregular heartbeat, confusion, change in vision, trouble speaking, trouble walking Tumor lysis syndrome (TLS)--nausea, vomiting, diarrhea, decrease in the amount of urine, dark urine, unusual weakness or fatigue, confusion, muscle pain or cramps, fast or irregular heartbeat, joint pain Side effects that usually do not require medical attention (report to your care team if they continue or are bothersome): Constipation Diarrhea Fatigue Loss of appetite Nausea This list may not describe all possible side effects. Call your doctor for medical advice about side effects. You may report side effects to FDA at 1-800-FDA-1088. Where should I keep my medication? This medication is given in a hospital or clinic. It will not be stored at home. NOTE: This sheet is a summary. It may not cover all possible information. If you have questions about this medicine, talk to your doctor, pharmacist, or health care provider.  2023 Elsevier/Gold Standard (2021-10-06 00:00:00)        To help   prevent nausea and vomiting after your treatment, we encourage you to take your nausea medication as directed.  BELOW ARE SYMPTOMS THAT SHOULD BE REPORTED IMMEDIATELY: *FEVER GREATER THAN 100.4 F (38 C) OR HIGHER *CHILLS OR SWEATING *NAUSEA AND VOMITING THAT IS NOT CONTROLLED WITH YOUR NAUSEA MEDICATION *UNUSUAL SHORTNESS OF BREATH *UNUSUAL BRUISING OR BLEEDING *URINARY PROBLEMS (pain or burning when urinating, or frequent urination) *BOWEL PROBLEMS (unusual diarrhea,  constipation, pain near the anus) TENDERNESS IN MOUTH AND THROAT WITH OR WITHOUT PRESENCE OF ULCERS (sore throat, sores in mouth, or a toothache) UNUSUAL RASH, SWELLING OR PAIN  UNUSUAL VAGINAL DISCHARGE OR ITCHING   Items with * indicate a potential emergency and should be followed up as soon as possible or go to the Emergency Department if any problems should occur.  Please show the CHEMOTHERAPY ALERT CARD or IMMUNOTHERAPY ALERT CARD at check-in to the Emergency Department and triage nurse.  Should you have questions after your visit or need to cancel or reschedule your appointment, please contact [facility] [place] [phone]  and follow the prompts.  Office hours are 8:00 a.m. to 4:30 p.m. Monday - Friday. Please note that voicemails left after 4:00 p.m. may not be returned until the following business day.  We are closed weekends and major holidays. You have access to a nurse at all times for urgent questions. Please call the main number to the clinic [phone] and follow the prompts.  For any non-urgent questions, you may also contact your provider using MyChart. We now offer e-Visits for anyone 18 and older to request care online for non-urgent symptoms. For details visit [email or web address].   Also download the MyChart app! Go to the app store, search "MyChart", open the app, select [facility], and log in with your MyChart username and password.   

## 2022-08-03 NOTE — Progress Notes (Signed)
Patient presents today for Velcade injection.  Patient is in satisfactory condition with no new complaints voiced.  Vital signs are stable.  Labs reviewed.  Platelets today are 74.  MD made aware.  Potassium is 3.1 today.  We will give Klor Con 40 mEq PO x one dose today per standing orders by Dr. Delton Coombes.  We will proceed with treatment per MD orders.   Patient tolerated Velcade injection with no complaints voiced.  Site clean and dry with no bruising or swelling noted.  No complaints of pain.  Discharged with vital signs stable and no signs or symptoms of distress noted.

## 2022-08-03 NOTE — Progress Notes (Signed)
Ok to proceed with today's labs. ? ?T.O. Dr Katragadda/Dwon Sky, PharmD ?

## 2022-08-04 ENCOUNTER — Other Ambulatory Visit: Payer: Self-pay

## 2022-08-04 MED ORDER — LENALIDOMIDE 10 MG PO CAPS: ORAL_CAPSULE | ORAL | 10 refills | Status: DC

## 2022-08-04 NOTE — Telephone Encounter (Signed)
Chart reviewed. Revlimid refilled per last office note with Dr. Katragadda.  

## 2022-08-18 ENCOUNTER — Inpatient Hospital Stay: Payer: Medicare Other

## 2022-08-18 VITALS — BP 110/60 | HR 90 | Temp 98.5°F | Resp 18

## 2022-08-18 DIAGNOSIS — C9000 Multiple myeloma not having achieved remission: Secondary | ICD-10-CM

## 2022-08-18 LAB — COMPREHENSIVE METABOLIC PANEL
ALT: 18 U/L (ref 0–44)
AST: 20 U/L (ref 15–41)
Albumin: 4 g/dL (ref 3.5–5.0)
Alkaline Phosphatase: 47 U/L (ref 38–126)
Anion gap: 10 (ref 5–15)
BUN: 16 mg/dL (ref 8–23)
CO2: 27 mmol/L (ref 22–32)
Calcium: 8.4 mg/dL — ABNORMAL LOW (ref 8.9–10.3)
Chloride: 103 mmol/L (ref 98–111)
Creatinine, Ser: 0.86 mg/dL (ref 0.44–1.00)
GFR, Estimated: >60 mL/min (ref >60)
Glucose, Bld: 99 mg/dL (ref 70–99)
Potassium: 3.2 mmol/L — ABNORMAL LOW (ref 3.5–5.1)
Sodium: 140 mmol/L (ref 135–145)
Total Bilirubin: 0.6 mg/dL (ref 0.3–1.2)
Total Protein: 6 g/dL — ABNORMAL LOW (ref 6.5–8.1)

## 2022-08-18 LAB — CBC WITH DIFFERENTIAL/PLATELET
Abs Immature Granulocytes: 0 10*3/uL (ref 0.00–0.07)
Basophils Absolute: 0 10*3/uL (ref 0.0–0.1)
Basophils Relative: 1 %
Eosinophils Absolute: 0.3 10*3/uL (ref 0.0–0.5)
Eosinophils Relative: 10 %
HCT: 33.5 % — ABNORMAL LOW (ref 36.0–46.0)
Hemoglobin: 11 g/dL — ABNORMAL LOW (ref 12.0–15.0)
Immature Granulocytes: 0 %
Lymphocytes Relative: 28 %
Lymphs Abs: 0.9 10*3/uL (ref 0.7–4.0)
MCH: 34.2 pg — ABNORMAL HIGH (ref 26.0–34.0)
MCHC: 32.8 g/dL (ref 30.0–36.0)
MCV: 104 fL — ABNORMAL HIGH (ref 80.0–100.0)
Monocytes Absolute: 0.2 10*3/uL (ref 0.1–1.0)
Monocytes Relative: 6 %
Neutro Abs: 1.8 10*3/uL (ref 1.7–7.7)
Neutrophils Relative %: 55 %
Platelets: 97 10*3/uL — ABNORMAL LOW (ref 150–400)
RBC: 3.22 MIL/uL — ABNORMAL LOW (ref 3.87–5.11)
RDW: 16.3 % — ABNORMAL HIGH (ref 11.5–15.5)
WBC: 3.3 10*3/uL — ABNORMAL LOW (ref 4.0–10.5)
nRBC: 0 % (ref 0.0–0.2)

## 2022-08-18 LAB — MAGNESIUM: Magnesium: 1.9 mg/dL (ref 1.7–2.4)

## 2022-08-18 MED ORDER — BORTEZOMIB CHEMO SQ INJECTION 3.5 MG (2.5MG/ML)
1.0000 mg/m2 | Freq: Once | INTRAMUSCULAR | Status: AC
  Administered 2022-08-18: 1.75 mg via SUBCUTANEOUS
  Filled 2022-08-18: qty 0.7

## 2022-08-18 MED ORDER — PROCHLORPERAZINE MALEATE 10 MG PO TABS
10.0000 mg | ORAL_TABLET | Freq: Once | ORAL | Status: AC
  Administered 2022-08-18: 10 mg via ORAL
  Filled 2022-08-18: qty 1

## 2022-08-18 NOTE — Patient Instructions (Signed)
[facility]  Discharge Instructions: Thank you for choosing [facility] to provide your oncology and hematology care.  If you have a lab appointment with the Cancer Center, please come in thru the Main Entrance and check in at the main information desk.  Wear comfortable clothing and clothing appropriate for easy access to any Portacath or PICC line.   We strive to give you quality time with your provider. You may need to reschedule your appointment if you arrive late (15 or more minutes).  Arriving late affects you and other patients whose appointments are after yours.  Also, if you miss three or more appointments without notifying the office, you may be dismissed from the clinic at the provider's discretion.      For prescription refill requests, have your pharmacy contact our office and allow 72 hours for refills to be completed.    Today you received the following chemotherapy and/or immunotherapy agents Velcade.  Bortezomib Injection What is this medication? BORTEZOMIB (bor TEZ oh mib) treats lymphoma. It may also be used to treat multiple myeloma, a type of bone marrow cancer. It works by blocking a protein that causes cancer cells to grow and multiply. This helps to slow or stop the spread of cancer cells. This medicine may be used for other purposes; ask your health care provider or pharmacist if you have questions. COMMON BRAND NAME(S): Velcade What should I tell my care team before I take this medication? They need to know if you have any of these conditions: Dehydration Diabetes Heart disease Liver disease Tingling of the fingers or toes or other nerve disorder An unusual or allergic reaction to bortezomib, other medications, foods, dyes, or preservatives If you or your partner are pregnant or trying to get pregnant Breastfeeding How should I use this medication? This medication is injected into a vein or under the skin. It is given by your  care team in a hospital or clinic setting. Talk to your care team about the use of this medication in children. Special care may be needed. Overdosage: If you think you have taken too much of this medicine contact a poison control center or emergency room at once. NOTE: This medicine is only for you. Do not share this medicine with others. What if I miss a dose? Keep appointments for follow-up doses. It is important not to miss your dose. Call your care team if you are unable to keep an appointment. What may interact with this medication? Ketoconazole Rifampin This list may not describe all possible interactions. Give your health care provider a list of all the medicines, herbs, non-prescription drugs, or dietary supplements you use. Also tell them if you smoke, drink alcohol, or use illegal drugs. Some items may interact with your medicine. What should I watch for while using this medication? Your condition will be monitored carefully while you are receiving this medication. You may need blood work while taking this medication. This medication may affect your coordination, reaction time, or judgment. Do not drive or operate machinery until you know how this medication affects you. Sit up or stand slowly to reduce the risk of dizzy or fainting spells. Drinking alcohol with this medication can increase the risk of these side effects. This medication may increase your risk of getting an infection. Call your care team for advice if you get a fever, chills, sore throat, or other symptoms of a cold or flu. Do not treat yourself. Try to avoid being around   people who are sick. Check with your care team if you have severe diarrhea, nausea, and vomiting, or if you sweat a lot. The loss of too much body fluid may make it dangerous for you to take this medication. Talk to your care team if you may be pregnant. Serious birth defects can occur if you take this medication during pregnancy and for 7 months after the  last dose. You will need a negative pregnancy test before starting this medication. Contraception is recommended while taking this medication and for 7 months after the last dose. Your care team can help you find the option that works for you. If your partner can get pregnant, use a condom during sex while taking this medication and for 4 months after the last dose. Do not breastfeed while taking this medication and for 2 months after the last dose. This medication may cause infertility. Talk to your care team if you are concerned about your fertility. What side effects may I notice from receiving this medication? Side effects that you should report to your care team as soon as possible: Allergic reactions--skin rash, itching, hives, swelling of the face, lips, tongue, or throat Bleeding--bloody or black, tar-like stools, vomiting blood or Ahmod Gillespie material that looks like coffee grounds, red or dark Laquanna Veazey urine, small red or purple spots on skin, unusual bruising or bleeding Bleeding in the brain--severe headache, stiff neck, confusion, dizziness, change in vision, numbness or weakness of the face, arm, or leg, trouble speaking, trouble walking, vomiting Bowel blockage--stomach cramping, unable to have a bowel movement or pass gas, loss of appetite, vomiting Heart failure--shortness of breath, swelling of the ankles, feet, or hands, sudden weight gain, unusual weakness or fatigue Infection--fever, chills, cough, sore throat, wounds that don't heal, pain or trouble when passing urine, general feeling of discomfort or being unwell Liver injury--right upper belly pain, loss of appetite, nausea, light-colored stool, dark yellow or Ravon Mcilhenny urine, yellowing skin or eyes, unusual weakness or fatigue Low blood pressure--dizziness, feeling faint or lightheaded, blurry vision Lung injury--shortness of breath or trouble breathing, cough, spitting up blood, chest pain, fever Pain, tingling, or numbness in the hands  or feet Severe or prolonged diarrhea Stomach pain, bloody diarrhea, pale skin, unusual weakness or fatigue, decrease in the amount of urine, which may be signs of hemolytic uremic syndrome Sudden and severe headache, confusion, change in vision, seizures, which may be signs of posterior reversible encephalopathy syndrome (PRES) TTP--purple spots on the skin or inside the mouth, pale skin, yellowing skin or eyes, unusual weakness or fatigue, fever, fast or irregular heartbeat, confusion, change in vision, trouble speaking, trouble walking Tumor lysis syndrome (TLS)--nausea, vomiting, diarrhea, decrease in the amount of urine, dark urine, unusual weakness or fatigue, confusion, muscle pain or cramps, fast or irregular heartbeat, joint pain Side effects that usually do not require medical attention (report to your care team if they continue or are bothersome): Constipation Diarrhea Fatigue Loss of appetite Nausea This list may not describe all possible side effects. Call your doctor for medical advice about side effects. You may report side effects to FDA at 1-800-FDA-1088. Where should I keep my medication? This medication is given in a hospital or clinic. It will not be stored at home. NOTE: This sheet is a summary. It may not cover all possible information. If you have questions about this medicine, talk to your doctor, pharmacist, or health care provider.  2023 Elsevier/Gold Standard (2021-10-06 00:00:00)        To help   prevent nausea and vomiting after your treatment, we encourage you to take your nausea medication as directed.  BELOW ARE SYMPTOMS THAT SHOULD BE REPORTED IMMEDIATELY: *FEVER GREATER THAN 100.4 F (38 C) OR HIGHER *CHILLS OR SWEATING *NAUSEA AND VOMITING THAT IS NOT CONTROLLED WITH YOUR NAUSEA MEDICATION *UNUSUAL SHORTNESS OF BREATH *UNUSUAL BRUISING OR BLEEDING *URINARY PROBLEMS (pain or burning when urinating, or frequent urination) *BOWEL PROBLEMS (unusual diarrhea,  constipation, pain near the anus) TENDERNESS IN MOUTH AND THROAT WITH OR WITHOUT PRESENCE OF ULCERS (sore throat, sores in mouth, or a toothache) UNUSUAL RASH, SWELLING OR PAIN  UNUSUAL VAGINAL DISCHARGE OR ITCHING   Items with * indicate a potential emergency and should be followed up as soon as possible or go to the Emergency Department if any problems should occur.  Please show the CHEMOTHERAPY ALERT CARD or IMMUNOTHERAPY ALERT CARD at check-in to the Emergency Department and triage nurse.  Should you have questions after your visit or need to cancel or reschedule your appointment, please contact [facility] [place] [phone]  and follow the prompts.  Office hours are 8:00 a.m. to 4:30 p.m. Monday - Friday. Please note that voicemails left after 4:00 p.m. may not be returned until the following business day.  We are closed weekends and major holidays. You have access to a nurse at all times for urgent questions. Please call the main number to the clinic [phone] and follow the prompts.  For any non-urgent questions, you may also contact your provider using MyChart. We now offer e-Visits for anyone 18 and older to request care online for non-urgent symptoms. For details visit [email or web address].   Also download the MyChart app! Go to the app store, search "MyChart", open the app, select , and log in with your MyChart username and password.   

## 2022-08-18 NOTE — Progress Notes (Signed)
Patient presents today for Velcade injection.  Patient is in satisfactory condition with no new complaints voiced.  Vital signs are stable.  Labs reviewed.  Potassium today is 3.2.  Patient stated that she is taking Potassium 20 mEq PO TID at home everyday.  She did not want to take any here today.  We will proceed with treatment per MD orders.    Patient tolerated Velcade injection with no complaints voiced.  Site clean and dry with no bruising or swelling noted.  No complaints of pain.  Discharged with vital signs stable and no signs or symptoms of distress noted.

## 2022-08-30 ENCOUNTER — Other Ambulatory Visit: Payer: Self-pay | Admitting: *Deleted

## 2022-08-30 NOTE — Progress Notes (Signed)
[facility] [address], [place] [address]    Clinic Day:  08/30/2022  Referring physician: Gilmore Laroche, FNP  Patient Care Team: Gilmore Laroche, FNP as PCP - General (Family Medicine) Doreatha Massed, MD as Medical Oncologist (Oncology) Mickie Bail, RN as Oncology Nurse Navigator (Oncology) Lanelle Bal, DO as Consulting Physician (Internal Medicine) Randa Spike, Kelton Pillar, LCSW as [facility] Care Management (Licensed Clinical Social Worker)   ASSESSMENT & PLAN:   Assessment: 1.  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  Increased number of atypical plasma cells present 36% of all cell lines.  Plasma cells are kappa light chain restricted. -Unfortunately her specimen has reached cytogenetics and FISH lab week later due to bad weather and no viable plasma cells. -PET scan on 07/09/2019 showed no findings of active myeloma. -24-hour urine shows total protein 59 mg.  Urine immunofixation shows IgA kappa monoclonal protein.  LDH is 184. -Based on [facility] criteria she has high risk with about 45-50% probability of progression to myeloma in the next 2 years. -CTAP on 01/07/2020 shows acute superior endplate burst fracture of L1.  Mild associated paravertebral soft tissue thickening.  No other bone abnormalities. -Bone density test on 02/07/2020 shows T score -0.5. - Bone marrow biopsy on 07/12/2021: Hypercellular with increased number of atypical plasma cells representing 30% of cells, displaying kappa light chain restriction. - FISH panel: Gain of 1 q. (high risk), T p53 deletion (high risk), monosomy 13 (standard risk) - Cytogenetics: Complex karyotype. - PET scan (06/24/2021): New hypermetabolic bone lesions involving left anterior iliac crest, left mid humerus, right superior pubic ramus, bilateral mid femurs, left calvarium.  Largest lytic lesion in  the left iliac crest measuring 2.9 x 2.3 cm. - 2D echo on 07/29/2021: EF 50-55%.  There is distal septal and inferior apical hypokinesis.  (Carfilzomib based regimen was put on hold due to echo findings) - Dara VRD 6 cycles from 08/10/2021 through 11/30/2021 - Myeloma panel 12/07/2021, DIRA test was negative, M spike 0.2 g and light chain ratio normal. - Maintenance Velcade every 2 weeks and Revlimid 3 weeks on 1 week off Started on 12/20/2021    2.  Social/family history: - She uses walker to ambulate since her right knee replacement leading to stiffness.  She lives with older sister and husband who has dementia. - She is independent of ADLs and IADLs.  She also drives    Plan: 1.  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 - Reviewed myeloma labs from 06/22/2022.  M spike is not observed.  Free light chain ratio is normal.  Immunofixation was unremarkable. - Labs today showed creatinine and LFTs are normal.  Mild thrombocytopenia stable at 107.  White count and hemoglobin are within normal limits. - Continue Revlimid 10 mg 3 weeks on/1 week off.  Continue Velcade every 2 weeks.  RTC 8 weeks for follow-up with repeat myeloma labs.   2.  Electrolyte abnormalities: - Potassium is slightly low at 3.2.  HCTZ was discontinued.  Continue potassium 3 times daily.  Continue magnesium supplements.  Magnesium is normal.   3.  Pulmonary embolism (2015): - Continue Coumadin indefinitely.  No bleeding issues.  4.  ID prophylaxis: - Continue acyclovir twice daily.  5.  Myeloma bone disease: - Calcium today is 8.5.  She wants to  have dental extractions done.  We will hold denosumab until her next visit in 2 months.  6.  Right knee pain: - Continue hydrocodone 5/325 every 12 hours as needed.  No orders of the defined types were placed in this encounter.     I,Katie Daubenspeck,acting as a Neurosurgeonscribe for Doreatha MassedSreedhar Morna Flud, MD.,have  documented all relevant documentation on the behalf of Doreatha MassedSreedhar Elani Delph, MD,as directed by  Doreatha MassedSreedhar Sullivan Jacuinde, MD while in the presence of Doreatha MassedSreedhar Isacc Turney, MD.   ***  Katie Daubenspeck   4/9/20249:03 PM  CHIEF COMPLAINT:   Diagnosis: multiple myeloma   Cancer Staging  No matching staging information was found for the patient.   Prior Therapy: Dara VRD 6 cycles completed on 11/30/2021   Current Therapy:  Maintenance Velcade every 2 weeks, and Revlimid 3 weeks on/1 week off    HISTORY OF PRESENT ILLNESS:   Oncology History  IgA myeloma  05/06/2021 Initial Diagnosis   IgA myeloma (HCC)   08/10/2021 - 01/19/2022 Chemotherapy   Patient is on Treatment Plan : MYELOMA NEWLY DIAGNOSED TRANSPLANT CANDIDATE DaraVRd (Daratumumab SQ) q21d x 6 Cycles (Induction/Consolidation)     08/10/2021 -  Chemotherapy   Patient is on Treatment Plan : MYELOMA NEWLY DIAGNOSED TRANSPLANT CANDIDATE DaraVRd (Daratumumab SQ) q21d x 6 Cycles (Induction/Consolidation)        INTERVAL HISTORY:   Claris CheMargaret is a 72 y.o. female presenting to clinic today for follow up of multiple myeloma. She was last seen by me on 07/07/22.  Today, she states that she is doing well overall. Her appetite level is at ***%. Her energy level is at ***%.  PAST MEDICAL HISTORY:   Past Medical History: Past Medical History:  Diagnosis Date   Acute myocardial infarction [facility](HCC) 2009   CAD/no stent medically managed   Anxiety disorder    CAD (coronary artery disease)    Cancer (HCC)    multiple myeloma   Hyperlipidemia    Hypertension    IBS (irritable bowel syndrome)    Non-ulcer dyspepsia 04/30/2009   Qualifier: Diagnosis of  By: Darrick PennaFields MD, Sandi L    Overactive bladder    PE (pulmonary embolism) 10/2012   left lung   Presence of permanent cardiac pacemaker    Sleep apnea     Surgical History: Past Surgical History:  Procedure Laterality Date   ABDOMINAL HYSTERECTOMY     BSO secondary to cyst     CARDIAC  CATHETERIZATION     COLONOSCOPY  12/2009   Dr. Aleene DavidsonSpainhour, propofol, normal. Next TCS 12/2019   COLONOSCOPY N/A 05/22/2017   Examined portion ileum was normal, significant looping of colon, external hemorrhoids and rectal bleeding due to internal hemorrhoids, mild diverticulosis procedure: COLONOSCOPY;  Surgeon: West BaliFields, Sandi L, MD;  [facility];  Service: Endoscopy;  Laterality: N/A;  10:30am   ESOPHAGOGASTRODUODENOSCOPY  05/11/2009   schatzki ring/small hiatal hernia/path:gastritis   ESOPHAGOGASTRODUODENOSCOPY N/A 05/22/2017   Multiple gastric polyps with biopsy benign fundic gland polyp, small bowel biopsy negative for celiac, gastric biopsy with mild gastritis but no H. pylori procedure: ESOPHAGOGASTRODUODENOSCOPY (EGD);  Surgeon: West BaliFields, Sandi L, MD;  Location: [facility];  Service: Endoscopy;  Laterality: N/A;   ESOPHAGOGASTRODUODENOSCOPY (EGD) WITH ESOPHAGEAL DILATION N/A 04/01/2013   GNF:[id number]:stricture at the gastroesophageal juction/multiple small polyps/mild gastritis   GIVENS CAPSULE STUDY N/A 06/07/2017   Frequent gastric erosions, normal small bowel mucosa.  Procedure: GIVENS CAPSULE STUDY;  Surgeon: West BaliFields, Sandi L, MD;  [facility]: [facility];  Service: Endoscopy;  Laterality:  N/A;  7:30am   INSERT / REPLACE / REMOVE PACEMAKER     Last year per pt.(can't remember date)   REPLACEMENT TOTAL KNEE Right 08/2020    Social History: Social History   Socioeconomic History   Marital status: Married    Spouse name: Doctor, general practice   Number of children: 3   Years of education: 16   Highest education level: Not on file  Occupational History   Not on file  Tobacco Use   Smoking status: Never    Passive exposure: Never   Smokeless tobacco: Never   Tobacco comments:    Never smoked   Vaping Use   Vaping Use: Never used  Substance and Sexual Activity   Alcohol use: Not Currently    Comment: occ wine   Drug use: No   Sexual activity: Not Currently  Other Topics Concern   Not  on file  Social History Narrative      Lives with husband-51 years 952 in Aug 2021   Daughter is close by, 2 sons live further away   One IN [place],ONE IN [place], ONE BESIDE HER.        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   Enjoys: reading, young adult      Diet: eats all food groups    Caffeine: coffee 1, tea daily soda-1 daily   Water: 1-2 cups      Wears seat belt    Does not use phone while driving   Smoke detectors at home    Licensed conveyancer -locked up      Left handed   One story home   Drinks caffeine   Social Determinants of Health   Financial Resource Strain: Low Risk  (11/15/2021)   Overall Financial Resource Strain (CARDIA)    Difficulty of Paying Living Expenses: Not hard at all  Food Insecurity: No Food Insecurity (11/15/2021)   Hunger Vital Sign    Worried About Running Out of Food in the Last Year: Never true    Ran Out of Food in the Last Year: Never true  Transportation Needs: No Transportation Needs (11/15/2021)   PRAPARE - Administrator, Civil Service (Medical): No    Lack of Transportation (Non-Medical): No  Physical Activity: Inactive (11/26/2021)   Exercise Vital Sign    Days of Exercise per Week: 0 days    Minutes of Exercise per Session: 0 min  Stress: Stress Concern Present (11/26/2021)   [employer] of Occupational Health - Occupational Stress Questionnaire    Feeling of Stress : To some extent  Social Connections: Moderately Integrated (11/11/2020)   Social Connection and Isolation Panel [NHANES]    Frequency of Communication with Friends and Family: Once a week    Frequency of Social Gatherings with Friends and Family: Once a week    Attends Religious Services: More than 4 times per year    Active Member of [employer] or Organizations: Yes    Attends Engineer, structural: More than 4 times per year    Marital Status: Married  Catering manager Violence: Not At Risk (11/11/2020)   Humiliation,  Afraid, Rape, and Kick questionnaire    Fear of Current or Ex-Partner: No    Emotionally Abused: No    Physically Abused: No    Sexually Abused: No    Family History: Family History  Problem Relation Age of Onset   Colon polyps Neg Hx    Colon cancer Neg Hx  Current Medications:  Current Outpatient Medications:    acetaminophen (TYLENOL) 500 MG tablet, Take 1,000 mg by mouth 2 (two) times daily as needed for moderate pain or headache., Disp: , Rfl:    acyclovir (ZOVIRAX) 400 MG tablet, TAKE 1 TABLET BY MOUTH TWICE DAILY, Disp: 60 tablet, Rfl: 6   albuterol (VENTOLIN HFA) 108 (90 Base) MCG/ACT inhaler, USE 2 PUFFS ONE TO TWO TIMES A DAY., Disp: 18 g, Rfl: 5   amLODipine (NORVASC) 10 MG tablet, TAKE 1 TABLET BY MOUTH ONCE DAILY., Disp: 90 tablet, Rfl: 0   ascorbic acid (VITAMIN C) 500 MG tablet, Take 500 mg by mouth daily., Disp: , Rfl:    aspirin EC 81 MG tablet, Take 81 mg by mouth at bedtime., Disp: , Rfl:    B Complex Vitamins (VITAMIN B COMPLEX) TABS, Take 1 tablet by mouth daily., Disp: , Rfl:    B Complex-Folic Acid (B COMPLEX VITAMINS, W/ FA,) CAPS, Take 1 capsule by mouth daily., Disp: , Rfl:    bortezomib SQ (VELCADE) 3.5 MG injection, Inject into the skin once., Disp: , Rfl:    busPIRone (BUSPAR) 5 MG tablet, Take 1 tablet (5 mg total) by mouth daily., Disp: 30 tablet, Rfl: 3   Calcium Carbonate-Vitamin D (CALCIUM 600+D PO), Take 1 tablet by mouth 2 (two) times daily., Disp: , Rfl:    chlorthalidone (HYGROTON) 25 MG tablet, Take 25 mg by mouth daily., Disp: , Rfl:    cholecalciferol 25 MCG (1000 UT) tablet, Take by mouth., Disp: , Rfl:    cyanocobalamin (VITAMIN B12) 1000 MCG tablet, Take by mouth., Disp: , Rfl:    cyclobenzaprine (FLEXERIL) 10 MG tablet, Take 1/2 tablet by mouth twice daily as needed for muscle spasms, Disp: 20 tablet, Rfl: 0   Daratumumab-Hyaluronidase-fihj (DARZALEX FASPRO [place]), Inject into the skin., Disp: , Rfl:    diclofenac Sodium (VOLTAREN) 1 %  GEL, Apply 4 g topically 4 (four) times daily., Disp: 50 g, Rfl: 0   doxycycline (VIBRAMYCIN) 100 MG capsule, Take 100 mg by mouth daily., Disp: , Rfl:    fluticasone (FLONASE) 50 MCG/ACT nasal spray, USE 2 SPRAYS IN EACH NOSTRIL ONCE DAILY., Disp: 16 g, Rfl: 0   fluticasone-salmeterol (ADVAIR) 250-50 MCG/ACT AEPB, Inhale into the lungs., Disp: , Rfl:    folic acid (FOLVITE) 1 MG tablet, TAKE 1 TABLET BY MOUTH ONCE DAILY., Disp: 30 tablet, Rfl: 11   gabapentin (NEURONTIN) 100 MG capsule, Take 100 mg by mouth 3 (three) times daily., Disp: , Rfl:    gabapentin (NEURONTIN) 600 MG tablet, Take by mouth., Disp: , Rfl:    hydrochlorothiazide (MICROZIDE) 12.5 MG capsule, Take 12.5 mg by mouth daily., Disp: , Rfl:    HYDROcodone-acetaminophen (NORCO/VICODIN) 5-325 MG tablet, Take 1 tablet by mouth every 8 (eight) hours as needed for moderate pain., Disp: 60 tablet, Rfl: 0   hydrOXYzine (VISTARIL) 25 MG capsule, , Disp: , Rfl:    Hyoscyamine Sulfate SL 0.125 MG SUBL, , Disp: , Rfl:    lenalidomide (REVLIMID) 10 MG capsule, Take 1 capsule (10 mg total) by mouth daily. 21 days on, 7 days off every 28 days, Disp: 21 capsule, Rfl: 10   lidocaine (HM LIDOCAINE PATCH) 4 %, Place 1 patch onto the skin daily., Disp: 14 patch, Rfl: 0   lidocaine (XYLOCAINE) 2 % solution, Take 2 teaspoons before meals and at bedtime as needed. May repeat every 4 hours for a maximum dose of 8 per day., Disp: 300 mL, Rfl: 0  Lidocaine HCl (XOLIDO) 2 % CREA, Apply 1 application topically daily as needed (arthritisis)., Disp: , Rfl:    loperamide (IMODIUM) 2 MG capsule, Take 4 mg by mouth as needed. Take 2 tablets after the first loose stool and then 1 tablet after each loose stool.  Do not exceed 8 capsules within 24 hours., Disp: , Rfl:    losartan (COZAAR) 100 MG tablet, , Disp: , Rfl:    magnesium oxide (MAG-OX) 400 (240 Mg) MG tablet, Take 1 tablet (400 mg total) by mouth 2 (two) times daily., Disp: 60 tablet, Rfl: 4   metoprolol  succinate (TOPROL-XL) 50 MG 24 hr tablet, , Disp: , Rfl:    metoprolol tartrate (LOPRESSOR) 50 MG tablet, Take 1 tablet (50 mg total) by mouth 2 (two) times daily. (Patient taking differently: Take 25 mg by mouth 2 (two) times daily.), Disp: 180 tablet, Rfl: 1   mirtazapine (REMERON) 15 MG tablet, Take 15 mg by mouth at bedtime., Disp: , Rfl:    montelukast (SINGULAIR) 10 MG tablet, Take 10 mg by mouth daily. , Disp: , Rfl:    MYRBETRIQ 25 MG TB24 tablet, TAKE ONE TABLET BY MOUTH ONCE DAILY, Disp: 30 tablet, Rfl: 0   nitroGLYCERIN (NITROSTAT) 0.4 MG SL tablet, Place 0.4 mg under the tongue every 5 (five) minutes as needed for chest pain., Disp: , Rfl:    pantoprazole (PROTONIX) 40 MG tablet, Take 40 mg by mouth daily., Disp: , Rfl:    potassium chloride (KLOR-CON M) 10 MEQ tablet, , Disp: , Rfl:    potassium chloride SA (KLOR-CON M) 20 MEQ tablet, Take 1 tablet (20 mEq total) by mouth 3 (three) times daily., Disp: 21 tablet, Rfl: 0   primidone (MYSOLINE) 50 MG tablet, Take 75 mg by mouth daily., Disp: , Rfl:    primidone (MYSOLINE) 50 MG tablet, Take by mouth., Disp: , Rfl:    Probiotic Product (GNP PROBIOTIC COLON SUPPORT) CAPS, Take 1 capsule by mouth daily., Disp: , Rfl:    sertraline (ZOLOFT) 100 MG tablet, Take 200 mg by mouth daily. , Disp: , Rfl:    simvastatin (ZOCOR) 40 MG tablet, Take 1 tablet (40 mg total) by mouth at bedtime., Disp: 90 tablet, Rfl: 1   tadalafil, PAH, (ALYQ) 20 MG tablet, Take 2 tablets every day by oral route., Disp: , Rfl:    traMADol (ULTRAM) 50 MG tablet, Take 50 mg by mouth 2 (two) times daily., Disp: , Rfl:    trimethoprim-polymyxin b (POLYTRIM) ophthalmic solution, INSTILL 1 DROP INTO AFFECTED EYE(S) BY OPHTHALMIC ROUTE EVERY 6 HOURS FOR 7 DAYS, Disp: , Rfl:    warfarin (COUMADIN) 3 MG tablet, Take 3 mg by mouth daily., Disp: , Rfl:    Allergies: Allergies  Allergen Reactions   Amoxicillin-Pot Clavulanate Other (See Comments)   Clarithromycin Other (See  Comments)    Stomach problems   Erythromycin    Lisinopril Swelling   Amoxicillin Diarrhea    REVIEW OF SYSTEMS:   Review of Systems  Constitutional:  Negative for chills, fatigue and fever.  HENT:   Negative for lump/mass, mouth sores, nosebleeds, sore throat and trouble swallowing.   Eyes:  Negative for eye problems.  Respiratory:  Negative for cough and shortness of breath.   Cardiovascular:  Negative for chest pain, leg swelling and palpitations.  Gastrointestinal:  Negative for abdominal pain, constipation, diarrhea, nausea and vomiting.  Genitourinary:  Negative for bladder incontinence, difficulty urinating, dysuria, frequency, hematuria and nocturia.   Musculoskeletal:  Negative  for arthralgias, back pain, flank pain, myalgias and neck pain.  Skin:  Negative for itching and rash.  Neurological:  Negative for dizziness, headaches and numbness.  Hematological:  Does not bruise/bleed easily.  Psychiatric/Behavioral:  Negative for depression, sleep disturbance and suicidal ideas. The patient is not nervous/anxious.   All other systems reviewed and are negative.    VITALS:   There were no vitals taken for this visit.  Wt Readings from Last 3 Encounters:  08/03/22 161 lb 9.6 oz (73.3 kg)  07/21/22 160 lb 9.6 oz (72.8 kg)  07/07/22 159 lb (72.1 kg)    There is no height or weight on file to calculate BMI.  Performance status (ECOG): {CHL ONC [id number]  PHYSICAL EXAM:   Physical Exam Vitals and nursing note reviewed. Exam conducted with a chaperone present.  Constitutional:      Appearance: Normal appearance.  Cardiovascular:     Rate and Rhythm: Normal rate and regular rhythm.     Pulses: Normal pulses.     Heart sounds: Normal heart sounds.  Pulmonary:     Effort: Pulmonary effort is normal.     Breath sounds: Normal breath sounds.  Abdominal:     Palpations: Abdomen is soft. There is no hepatomegaly, splenomegaly or mass.     Tenderness: There is no  abdominal tenderness.  Musculoskeletal:     Right lower leg: No edema.     Left lower leg: No edema.  Lymphadenopathy:     Cervical: No cervical adenopathy.     Right cervical: No superficial, deep or posterior cervical adenopathy.    Left cervical: No superficial, deep or posterior cervical adenopathy.     Upper Body:     Right upper body: No supraclavicular or axillary adenopathy.     Left upper body: No supraclavicular or axillary adenopathy.  Neurological:     General: No focal deficit present.     Mental Status: She is alert and oriented to person, place, and time.  Psychiatric:        Mood and Affect: Mood normal.        Behavior: Behavior normal.     LABS:      Latest Ref Rng & Units 08/18/2022   12:41 PM 08/03/2022   12:40 PM 07/21/2022   12:30 PM  CBC  WBC 4.0 - 10.5 K/uL 3.3  3.5  3.8   Hemoglobin 12.0 - 15.0 g/dL 16.1  09.6  04.5   Hematocrit 36.0 - 46.0 % 33.5  32.2  34.9   Platelets 150 - 400 K/uL 97  74  100       Latest Ref Rng & Units 08/18/2022   12:41 PM 08/03/2022   12:40 PM 07/21/2022   12:30 PM  CMP  Glucose 70 - 99 mg/dL 99  409  811   BUN 8 - 23 mg/dL 16  16  12    Creatinine 0.44 - 1.00 mg/dL 9.14  7.82  9.56   Sodium 135 - 145 mmol/L 140  139  138   Potassium 3.5 - 5.1 mmol/L 3.2  3.1  3.1   Chloride 98 - 111 mmol/L 103  104  104   CO2 22 - 32 mmol/L 27  24  27    Calcium 8.9 - 10.3 mg/dL 8.4  7.5  8.4   Total Protein 6.5 - 8.1 g/dL 6.0  6.2  6.4   Total Bilirubin 0.3 - 1.2 mg/dL 0.6  0.4  0.6   Alkaline Phos 38 - 126  U/L 47  49  50   AST 15 - 41 U/L 20  23  23    ALT 0 - 44 U/L 18  18  19       No results found for: "CEA1", "CEA" / No results found for: "CEA1", "CEA" No results found for: "PSA1" No results found for: "CAN199" No results found for: "CAN125"  Lab Results  Component Value Date   TOTALPROTELP 5.8 (L) 06/22/2022   TOTALPROTELP 5.9 (L) 06/22/2022   ALBUMINELP 3.8 06/22/2022   A1GS 0.3 06/22/2022   A2GS 0.7 06/22/2022   BETS  0.8 06/22/2022   GAMS 0.4 06/22/2022   MSPIKE Not Observed 06/22/2022   SPEI Comment 06/22/2022   Lab Results  Component Value Date   TIBC 291 06/09/2021   TIBC 297 01/21/2021   TIBC 302 10/21/2020   FERRITIN 279 06/09/2021   FERRITIN 216 01/21/2021   FERRITIN 274 10/21/2020   IRONPCTSAT 24 06/09/2021   IRONPCTSAT 19 01/21/2021   IRONPCTSAT 28 10/21/2020   Lab Results  Component Value Date   LDH 129 03/02/2022   LDH 121 12/14/2021   LDH 113 11/09/2021     STUDIES:   No results found.

## 2022-08-31 ENCOUNTER — Other Ambulatory Visit: Payer: Self-pay | Admitting: Hematology

## 2022-08-31 ENCOUNTER — Encounter (HOSPITAL_COMMUNITY): Payer: Self-pay | Admitting: Hematology

## 2022-08-31 ENCOUNTER — Other Ambulatory Visit: Payer: Self-pay | Admitting: *Deleted

## 2022-08-31 ENCOUNTER — Encounter: Payer: Self-pay | Admitting: Hematology

## 2022-08-31 ENCOUNTER — Inpatient Hospital Stay: Payer: Medicare Other | Attending: Hematology | Admitting: Hematology

## 2022-08-31 ENCOUNTER — Inpatient Hospital Stay: Payer: Medicare Other

## 2022-08-31 DIAGNOSIS — Z5112 Encounter for antineoplastic immunotherapy: Secondary | ICD-10-CM | POA: Insufficient documentation

## 2022-08-31 DIAGNOSIS — C9000 Multiple myeloma not having achieved remission: Secondary | ICD-10-CM

## 2022-08-31 MED ORDER — BORTEZOMIB CHEMO SQ INJECTION 3.5 MG (2.5MG/ML)
1.0000 mg/m2 | Freq: Once | INTRAMUSCULAR | Status: AC
  Administered 2022-08-31: 1.75 mg via SUBCUTANEOUS
  Filled 2022-08-31: qty 0.7

## 2022-08-31 MED ORDER — PROCHLORPERAZINE MALEATE 10 MG PO TABS
10.0000 mg | ORAL_TABLET | Freq: Four times a day (QID) | ORAL | Status: DC | PRN
  Administered 2022-08-31: 10 mg via ORAL
  Filled 2022-08-31: qty 1

## 2022-08-31 MED ORDER — LENALIDOMIDE 10 MG PO CAPS: ORAL_CAPSULE | ORAL | 0 refills | Status: DC

## 2022-08-31 MED ORDER — DEXAMETHASONE 4 MG PO TABS
20.0000 mg | ORAL_TABLET | Freq: Once | ORAL | Status: AC
  Administered 2022-08-31: 20 mg via ORAL
  Filled 2022-08-31: qty 5

## 2022-08-31 NOTE — Progress Notes (Signed)
Patient presents today for Velcade infusion. Patient is in satisfactory condition with no new complaints voiced.  Vital signs are stable.  Labs reviewed by Dr. Ellin Saba during the office visit and all labs are within treatment parameters.  Xgeva on hold, Dr.K is good using 3/28 labs for treatment today.  We will proceed with treatment per MD orders.

## 2022-08-31 NOTE — Patient Instructions (Signed)
[facility] at [facility] Discharge Instructions   You were seen and examined today by Dr. Ellin Saba.  He reviewed the results of your lab work which are normal/stable.   We will proceed with your treatment today.   Continue Revlimid as prescribed.   We will continue to hold your Xgeva (bone) shot until after your dental work is complete.   Return as scheduled.    Thank you for choosing [facility] at [facility] to provide your oncology and hematology care.  To afford each patient quality time with our provider, please arrive at least 15 minutes before your scheduled appointment time.   If you have a lab appointment with the Cancer Center please come in thru the Main Entrance and check in at the main information desk.  You need to re-schedule your appointment should you arrive 10 or more minutes late.  We strive to give you quality time with our providers, and arriving late affects you and other patients whose appointments are after yours.  Also, if you no show three or more times for appointments you may be dismissed from the clinic at the providers discretion.     Again, thank you for choosing [facility].  Our hope is that these requests will decrease the amount of time that you wait before being seen by our physicians.       _____________________________________________________________  Should you have questions after your visit to [facility], please contact our office at [phone] and follow the prompts.  Our office hours are 8:00 a.m. and 4:30 p.m. Monday - Friday.  Please note that voicemails left after 4:00 p.m. may not be returned until the following business day.  We are closed weekends and major holidays.  You do have access to a nurse 24-7, just call the main number to the clinic [phone] and do not press any options, hold on the line and a nurse will answer the phone.    For prescription refill  requests, have your pharmacy contact our office and allow 72 hours.    Due to Covid, you will need to wear a mask upon entering the hospital. If you do not have a mask, a mask will be given to you at the Main Entrance upon arrival. For doctor visits, patients may have 1 support person age 75 or older with them. For treatment visits, patients can not have anyone with them due to social distancing guidelines and our immunocompromised population.

## 2022-08-31 NOTE — Patient Instructions (Signed)
[facility]  Discharge Instructions: Thank you for choosing [facility] to provide your oncology and hematology care.  If you have a lab appointment with the Cancer Center - please note that after April 8th, 2024, all labs will be drawn in the cancer center.  You do not have to check in or register with the main entrance as you have in the past but will complete your check-in in the cancer center.  Wear comfortable clothing and clothing appropriate for easy access to any Portacath or PICC line.   We strive to give you quality time with your provider. You may need to reschedule your appointment if you arrive late (15 or more minutes).  Arriving late affects you and other patients whose appointments are after yours.  Also, if you miss three or more appointments without notifying the office, you may be dismissed from the clinic at the provider's discretion.      For prescription refill requests, have your pharmacy contact our office and allow 72 hours for refills to be completed.    Today you received the following chemotherapy and/or immunotherapy agents velcade.  Bortezomib Injection What is this medication? BORTEZOMIB (bor TEZ oh mib) treats lymphoma. It may also be used to treat multiple myeloma, a type of bone marrow cancer. It works by blocking a protein that causes cancer cells to grow and multiply. This helps to slow or stop the spread of cancer cells. This medicine may be used for other purposes; ask your health care provider or pharmacist if you have questions. COMMON BRAND NAME(S): Velcade What should I tell my care team before I take this medication? They need to know if you have any of these conditions: Dehydration Diabetes Heart disease Liver disease Tingling of the fingers or toes or other nerve disorder An unusual or allergic reaction to bortezomib, other medications, foods, dyes, or preservatives If you or your partner are pregnant or trying  to get pregnant Breastfeeding How should I use this medication? This medication is injected into a vein or under the skin. It is given by your care team in a hospital or clinic setting. Talk to your care team about the use of this medication in children. Special care may be needed. Overdosage: If you think you have taken too much of this medicine contact a poison control center or emergency room at once. NOTE: This medicine is only for you. Do not share this medicine with others. What if I miss a dose? Keep appointments for follow-up doses. It is important not to miss your dose. Call your care team if you are unable to keep an appointment. What may interact with this medication? Ketoconazole Rifampin This list may not describe all possible interactions. Give your health care provider a list of all the medicines, herbs, non-prescription drugs, or dietary supplements you use. Also tell them if you smoke, drink alcohol, or use illegal drugs. Some items may interact with your medicine. What should I watch for while using this medication? Your condition will be monitored carefully while you are receiving this medication. You may need blood work while taking this medication. This medication may affect your coordination, reaction time, or judgment. Do not drive or operate machinery until you know how this medication affects you. Sit up or stand slowly to reduce the risk of dizzy or fainting spells. Drinking alcohol with this medication can increase the risk of these side effects. This medication may increase your risk of getting an infection.  Call your care team for advice if you get a fever, chills, sore throat, or other symptoms of a cold or flu. Do not treat yourself. Try to avoid being around people who are sick. Check with your care team if you have severe diarrhea, nausea, and vomiting, or if you sweat a lot. The loss of too much body fluid may make it dangerous for you to take this medication. Talk  to your care team if you may be pregnant. Serious birth defects can occur if you take this medication during pregnancy and for 7 months after the last dose. You will need a negative pregnancy test before starting this medication. Contraception is recommended while taking this medication and for 7 months after the last dose. Your care team can help you find the option that works for you. If your partner can get pregnant, use a condom during sex while taking this medication and for 4 months after the last dose. Do not breastfeed while taking this medication and for 2 months after the last dose. This medication may cause infertility. Talk to your care team if you are concerned about your fertility. What side effects may I notice from receiving this medication? Side effects that you should report to your care team as soon as possible: Allergic reactions--skin rash, itching, hives, swelling of the face, lips, tongue, or throat Bleeding--bloody or black, tar-like stools, vomiting blood or brown material that looks like coffee grounds, red or dark brown urine, small red or purple spots on skin, unusual bruising or bleeding Bleeding in the brain--severe headache, stiff neck, confusion, dizziness, change in vision, numbness or weakness of the face, arm, or leg, trouble speaking, trouble walking, vomiting Bowel blockage--stomach cramping, unable to have a bowel movement or pass gas, loss of appetite, vomiting Heart failure--shortness of breath, swelling of the ankles, feet, or hands, sudden weight gain, unusual weakness or fatigue Infection--fever, chills, cough, sore throat, wounds that don't heal, pain or trouble when passing urine, general feeling of discomfort or being unwell Liver injury--right upper belly pain, loss of appetite, nausea, light-colored stool, dark yellow or brown urine, yellowing skin or eyes, unusual weakness or fatigue Low blood pressure--dizziness, feeling faint or lightheaded, blurry  vision Lung injury--shortness of breath or trouble breathing, cough, spitting up blood, chest pain, fever Pain, tingling, or numbness in the hands or feet Severe or prolonged diarrhea Stomach pain, bloody diarrhea, pale skin, unusual weakness or fatigue, decrease in the amount of urine, which may be signs of hemolytic uremic syndrome Sudden and severe headache, confusion, change in vision, seizures, which may be signs of posterior reversible encephalopathy syndrome (PRES) TTP--purple spots on the skin or inside the mouth, pale skin, yellowing skin or eyes, unusual weakness or fatigue, fever, fast or irregular heartbeat, confusion, change in vision, trouble speaking, trouble walking Tumor lysis syndrome (TLS)--nausea, vomiting, diarrhea, decrease in the amount of urine, dark urine, unusual weakness or fatigue, confusion, muscle pain or cramps, fast or irregular heartbeat, joint pain Side effects that usually do not require medical attention (report to your care team if they continue or are bothersome): Constipation Diarrhea Fatigue Loss of appetite Nausea This list may not describe all possible side effects. Call your doctor for medical advice about side effects. You may report side effects to FDA at 1-800-FDA-1088. Where should I keep my medication? This medication is given in a hospital or clinic. It will not be stored at home. NOTE: This sheet is a summary. It may not cover all possible information.  If you have questions about this medicine, talk to your doctor, pharmacist, or health care provider.  2023 Elsevier/Gold Standard (2021-10-06 00:00:00)       To help prevent nausea and vomiting after your treatment, we encourage you to take your nausea medication as directed.  BELOW ARE SYMPTOMS THAT SHOULD BE REPORTED IMMEDIATELY: *FEVER GREATER THAN 100.4 F (38 C) OR HIGHER *CHILLS OR SWEATING *NAUSEA AND VOMITING THAT IS NOT CONTROLLED WITH YOUR NAUSEA MEDICATION *UNUSUAL SHORTNESS OF  BREATH *UNUSUAL BRUISING OR BLEEDING *URINARY PROBLEMS (pain or burning when urinating, or frequent urination) *BOWEL PROBLEMS (unusual diarrhea, constipation, pain near the anus) TENDERNESS IN MOUTH AND THROAT WITH OR WITHOUT PRESENCE OF ULCERS (sore throat, sores in mouth, or a toothache) UNUSUAL RASH, SWELLING OR PAIN  UNUSUAL VAGINAL DISCHARGE OR ITCHING   Items with * indicate a potential emergency and should be followed up as soon as possible or go to the Emergency Department if any problems should occur.  Please show the CHEMOTHERAPY ALERT CARD or IMMUNOTHERAPY ALERT CARD at check-in to the Emergency Department and triage nurse.  Should you have questions after your visit or need to cancel or reschedule your appointment, please contact [facility] [phone]  and follow the prompts.  Office hours are 8:00 a.m. to 4:30 p.m. Monday - Friday. Please note that voicemails left after 4:00 p.m. may not be returned until the following business day.  We are closed weekends and major holidays. You have access to a nurse at all times for urgent questions. Please call the main number to the clinic [phone] and follow the prompts.  For any non-urgent questions, you may also contact your provider using MyChart. We now offer e-Visits for anyone 71 and older to request care online for non-urgent symptoms. For details visit [email or web address].   Also download the MyChart app! Go to the app store, search "MyChart", open the app, select [place], and log in with your MyChart username and password.

## 2022-08-31 NOTE — Progress Notes (Signed)
Patient tolerated Velcade injection with no complaints voiced.  Lab work reviewed.  See MAR for details.  Injection site clean and dry with no bruising or swelling noted.  Patient stable during and after injection.  Band aid applied.  VSS.  Patient left in satisfactory condition with no s/s of distress noted.   

## 2022-09-02 ENCOUNTER — Other Ambulatory Visit: Payer: Self-pay

## 2022-09-10 ENCOUNTER — Other Ambulatory Visit: Payer: Self-pay | Admitting: Family Medicine

## 2022-09-10 DIAGNOSIS — M62838 Other muscle spasm: Secondary | ICD-10-CM

## 2022-09-10 DIAGNOSIS — R32 Unspecified urinary incontinence: Secondary | ICD-10-CM

## 2022-09-14 ENCOUNTER — Inpatient Hospital Stay: Payer: Medicare Other

## 2022-09-14 VITALS — BP 123/70 | HR 85 | Temp 98.4°F | Wt 166.3 lb

## 2022-09-14 DIAGNOSIS — C9000 Multiple myeloma not having achieved remission: Secondary | ICD-10-CM

## 2022-09-14 DIAGNOSIS — Z5112 Encounter for antineoplastic immunotherapy: Secondary | ICD-10-CM | POA: Diagnosis not present

## 2022-09-14 LAB — COMPREHENSIVE METABOLIC PANEL
ALT: 20 U/L (ref 0–44)
AST: 21 U/L (ref 15–41)
Albumin: 3.9 g/dL (ref 3.5–5.0)
Alkaline Phosphatase: 64 U/L (ref 38–126)
Anion gap: 7 (ref 5–15)
BUN: 15 mg/dL (ref 8–23)
CO2: 29 mmol/L (ref 22–32)
Calcium: 8.5 mg/dL — ABNORMAL LOW (ref 8.9–10.3)
Chloride: 103 mmol/L (ref 98–111)
Creatinine, Ser: 0.84 mg/dL (ref 0.44–1.00)
GFR, Estimated: >60 mL/min (ref >60)
Glucose, Bld: 88 mg/dL (ref 70–99)
Potassium: 3.4 mmol/L — ABNORMAL LOW (ref 3.5–5.1)
Sodium: 139 mmol/L (ref 135–145)
Total Bilirubin: 0.8 mg/dL (ref 0.3–1.2)
Total Protein: 6.4 g/dL — ABNORMAL LOW (ref 6.5–8.1)

## 2022-09-14 LAB — CBC WITH DIFFERENTIAL/PLATELET
Abs Immature Granulocytes: 0.02 10*3/uL (ref 0.00–0.07)
Basophils Absolute: 0.1 10*3/uL (ref 0.0–0.1)
Basophils Relative: 2 %
Eosinophils Absolute: 0.3 10*3/uL (ref 0.0–0.5)
Eosinophils Relative: 7 %
HCT: 34.9 % — ABNORMAL LOW (ref 36.0–46.0)
Hemoglobin: 11.7 g/dL — ABNORMAL LOW (ref 12.0–15.0)
Immature Granulocytes: 1 %
Lymphocytes Relative: 25 %
Lymphs Abs: 1 10*3/uL (ref 0.7–4.0)
MCH: 34.7 pg — ABNORMAL HIGH (ref 26.0–34.0)
MCHC: 33.5 g/dL (ref 30.0–36.0)
MCV: 103.6 fL — ABNORMAL HIGH (ref 80.0–100.0)
Monocytes Absolute: 0.3 10*3/uL (ref 0.1–1.0)
Monocytes Relative: 8 %
Neutro Abs: 2.1 10*3/uL (ref 1.7–7.7)
Neutrophils Relative %: 57 %
Platelets: 91 10*3/uL — ABNORMAL LOW (ref 150–400)
RBC: 3.37 MIL/uL — ABNORMAL LOW (ref 3.87–5.11)
RDW: 16.7 % — ABNORMAL HIGH (ref 11.5–15.5)
WBC: 3.7 10*3/uL — ABNORMAL LOW (ref 4.0–10.5)
nRBC: 0 % (ref 0.0–0.2)

## 2022-09-14 LAB — MAGNESIUM: Magnesium: 1.9 mg/dL (ref 1.7–2.4)

## 2022-09-14 MED ORDER — PROCHLORPERAZINE MALEATE 10 MG PO TABS
10.0000 mg | ORAL_TABLET | Freq: Once | ORAL | Status: AC
  Administered 2022-09-14: 10 mg via ORAL
  Filled 2022-09-14: qty 1

## 2022-09-14 MED ORDER — BORTEZOMIB CHEMO SQ INJECTION 3.5 MG (2.5MG/ML)
1.0000 mg/m2 | Freq: Once | INTRAMUSCULAR | Status: AC
  Administered 2022-09-14: 1.75 mg via SUBCUTANEOUS
  Filled 2022-09-14: qty 0.7

## 2022-09-14 NOTE — Progress Notes (Signed)
Patient presents today for Velcade infusion.  Patient is in satisfactory condition with no new complaints voiced.  Vital signs are stable.  Labs reviewed and all labs are within treatment parameters.  We will proceed with treatment per MD orders.    Velcade given today per MD orders. Tolerated infusion without adverse affects. Vital signs stable. No complaints at this time. Discharged from clinic ambulatory with walker in stable condition. Alert and oriented x 3. F/U with [facility] as scheduled.

## 2022-09-14 NOTE — Patient Instructions (Signed)
[facility]  Discharge Instructions: Thank you for choosing [facility] to provide your oncology and hematology care.  If you have a lab appointment with the Cancer Center - please note that after April 8th, 2024, all labs will be drawn in the cancer center.  You do not have to check in or register with the main entrance as you have in the past but will complete your check-in in the cancer center.  Wear comfortable clothing and clothing appropriate for easy access to any Portacath or PICC line.   We strive to give you quality time with your provider. You may need to reschedule your appointment if you arrive late (15 or more minutes).  Arriving late affects you and other patients whose appointments are after yours.  Also, if you miss three or more appointments without notifying the office, you may be dismissed from the clinic at the provider's discretion.      For prescription refill requests, have your pharmacy contact our office and allow 72 hours for refills to be completed.    Today you received the following chemotherapy and/or immunotherapy agents Velcade   To help prevent nausea and vomiting after your treatment, we encourage you to take your nausea medication as directed.  Bortezomib Injection What is this medication? BORTEZOMIB (bor TEZ oh mib) treats lymphoma. It may also be used to treat multiple myeloma, a type of bone marrow cancer. It works by blocking a protein that causes cancer cells to grow and multiply. This helps to slow or stop the spread of cancer cells. This medicine may be used for other purposes; ask your health care provider or pharmacist if you have questions. COMMON BRAND NAME(S): Velcade What should I tell my care team before I take this medication? They need to know if you have any of these conditions: Dehydration Diabetes Heart disease Liver disease Tingling of the fingers or toes or other nerve disorder An unusual or  allergic reaction to bortezomib, other medications, foods, dyes, or preservatives If you or your partner are pregnant or trying to get pregnant Breastfeeding How should I use this medication? This medication is injected into a vein or under the skin. It is given by your care team in a hospital or clinic setting. Talk to your care team about the use of this medication in children. Special care may be needed. Overdosage: If you think you have taken too much of this medicine contact a poison control center or emergency room at once. NOTE: This medicine is only for you. Do not share this medicine with others. What if I miss a dose? Keep appointments for follow-up doses. It is important not to miss your dose. Call your care team if you are unable to keep an appointment. What may interact with this medication? Ketoconazole Rifampin This list may not describe all possible interactions. Give your health care provider a list of all the medicines, herbs, non-prescription drugs, or dietary supplements you use. Also tell them if you smoke, drink alcohol, or use illegal drugs. Some items may interact with your medicine. What should I watch for while using this medication? Your condition will be monitored carefully while you are receiving this medication. You may need blood work while taking this medication. This medication may affect your coordination, reaction time, or judgment. Do not drive or operate machinery until you know how this medication affects you. Sit up or stand slowly to reduce the risk of dizzy or fainting spells. Drinking alcohol   with this medication can increase the risk of these side effects. This medication may increase your risk of getting an infection. Call your care team for advice if you get a fever, chills, sore throat, or other symptoms of a cold or flu. Do not treat yourself. Try to avoid being around people who are sick. Check with your care team if you have severe diarrhea, nausea,  and vomiting, or if you sweat a lot. The loss of too much body fluid may make it dangerous for you to take this medication. Talk to your care team if you may be pregnant. Serious birth defects can occur if you take this medication during pregnancy and for 7 months after the last dose. You will need a negative pregnancy test before starting this medication. Contraception is recommended while taking this medication and for 7 months after the last dose. Your care team can help you find the option that works for you. If your partner can get pregnant, use a condom during sex while taking this medication and for 4 months after the last dose. Do not breastfeed while taking this medication and for 2 months after the last dose. This medication may cause infertility. Talk to your care team if you are concerned about your fertility. What side effects may I notice from receiving this medication? Side effects that you should report to your care team as soon as possible: Allergic reactions--skin rash, itching, hives, swelling of the face, lips, tongue, or throat Bleeding--bloody or black, tar-like stools, vomiting blood or brown material that looks like coffee grounds, red or dark brown urine, small red or purple spots on skin, unusual bruising or bleeding Bleeding in the brain--severe headache, stiff neck, confusion, dizziness, change in vision, numbness or weakness of the face, arm, or leg, trouble speaking, trouble walking, vomiting Bowel blockage--stomach cramping, unable to have a bowel movement or pass gas, loss of appetite, vomiting Heart failure--shortness of breath, swelling of the ankles, feet, or hands, sudden weight gain, unusual weakness or fatigue Infection--fever, chills, cough, sore throat, wounds that don't heal, pain or trouble when passing urine, general feeling of discomfort or being unwell Liver injury--right upper belly pain, loss of appetite, nausea, light-colored stool, dark yellow or brown  urine, yellowing skin or eyes, unusual weakness or fatigue Low blood pressure--dizziness, feeling faint or lightheaded, blurry vision Lung injury--shortness of breath or trouble breathing, cough, spitting up blood, chest pain, fever Pain, tingling, or numbness in the hands or feet Severe or prolonged diarrhea Stomach pain, bloody diarrhea, pale skin, unusual weakness or fatigue, decrease in the amount of urine, which may be signs of hemolytic uremic syndrome Sudden and severe headache, confusion, change in vision, seizures, which may be signs of posterior reversible encephalopathy syndrome (PRES) TTP--purple spots on the skin or inside the mouth, pale skin, yellowing skin or eyes, unusual weakness or fatigue, fever, fast or irregular heartbeat, confusion, change in vision, trouble speaking, trouble walking Tumor lysis syndrome (TLS)--nausea, vomiting, diarrhea, decrease in the amount of urine, dark urine, unusual weakness or fatigue, confusion, muscle pain or cramps, fast or irregular heartbeat, joint pain Side effects that usually do not require medical attention (report to your care team if they continue or are bothersome): Constipation Diarrhea Fatigue Loss of appetite Nausea This list may not describe all possible side effects. Call your doctor for medical advice about side effects. You may report side effects to FDA at 1-800-FDA-1088. Where should I keep my medication? This medication is given in a hospital or   clinic. It will not be stored at home. NOTE: This sheet is a summary. It may not cover all possible information. If you have questions about this medicine, talk to your doctor, pharmacist, or health care provider.  2023 Elsevier/Gold Standard (2021-10-06 00:00:00)  BELOW ARE SYMPTOMS THAT SHOULD BE REPORTED IMMEDIATELY: *FEVER GREATER THAN 100.4 F (38 C) OR HIGHER *CHILLS OR SWEATING *NAUSEA AND VOMITING THAT IS NOT CONTROLLED WITH YOUR NAUSEA MEDICATION *UNUSUAL SHORTNESS OF  BREATH *UNUSUAL BRUISING OR BLEEDING *URINARY PROBLEMS (pain or burning when urinating, or frequent urination) *BOWEL PROBLEMS (unusual diarrhea, constipation, pain near the anus) TENDERNESS IN MOUTH AND THROAT WITH OR WITHOUT PRESENCE OF ULCERS (sore throat, sores in mouth, or a toothache) UNUSUAL RASH, SWELLING OR PAIN  UNUSUAL VAGINAL DISCHARGE OR ITCHING   Items with * indicate a potential emergency and should be followed up as soon as possible or go to the Emergency Department if any problems should occur.  Please show the CHEMOTHERAPY ALERT CARD or IMMUNOTHERAPY ALERT CARD at check-in to the Emergency Department and triage nurse.  Should you have questions after your visit or need to cancel or reschedule your appointment, please contact [facility] [phone]  and follow the prompts.  Office hours are 8:00 a.m. to 4:30 p.m. Monday - Friday. Please note that voicemails left after 4:00 p.m. may not be returned until the following business day.  We are closed weekends and major holidays. You have access to a nurse at all times for urgent questions. Please call the main number to the clinic [phone] and follow the prompts.  For any non-urgent questions, you may also contact your provider using MyChart. We now offer e-Visits for anyone 18 and older to request care online for non-urgent symptoms. For details visit [email or web address].   Also download the MyChart app! Go to the app store, search "MyChart", open the app, select [place], and log in with your MyChart username and password.   

## 2022-09-26 ENCOUNTER — Other Ambulatory Visit: Payer: Self-pay

## 2022-09-26 MED ORDER — LENALIDOMIDE 10 MG PO CAPS: ORAL_CAPSULE | ORAL | 0 refills | Status: DC

## 2022-09-26 NOTE — Telephone Encounter (Signed)
Chart reviewed. Revlimid refilled per last office note with Dr. Katragadda.  

## 2022-09-28 ENCOUNTER — Inpatient Hospital Stay: Payer: Medicare Other

## 2022-09-28 ENCOUNTER — Inpatient Hospital Stay: Payer: Medicare Other | Attending: Hematology

## 2022-09-28 ENCOUNTER — Inpatient Hospital Stay: Payer: Medicare Other | Admitting: Hematology

## 2022-09-28 VITALS — BP 109/81 | HR 90 | Temp 98.1°F | Resp 18 | Wt 159.6 lb

## 2022-09-28 DIAGNOSIS — C9000 Multiple myeloma not having achieved remission: Secondary | ICD-10-CM

## 2022-09-28 DIAGNOSIS — Z5112 Encounter for antineoplastic immunotherapy: Secondary | ICD-10-CM | POA: Insufficient documentation

## 2022-09-28 LAB — CBC WITH DIFFERENTIAL/PLATELET
Abs Immature Granulocytes: 0.02 10*3/uL (ref 0.00–0.07)
Basophils Absolute: 0.1 10*3/uL (ref 0.0–0.1)
Basophils Relative: 1 %
Eosinophils Absolute: 0.6 10*3/uL — ABNORMAL HIGH (ref 0.0–0.5)
Eosinophils Relative: 12 %
HCT: 36.7 % (ref 36.0–46.0)
Hemoglobin: 11.8 g/dL — ABNORMAL LOW (ref 12.0–15.0)
Immature Granulocytes: 0 %
Lymphocytes Relative: 24 %
Lymphs Abs: 1.1 10*3/uL (ref 0.7–4.0)
MCH: 33.8 pg (ref 26.0–34.0)
MCHC: 32.2 g/dL (ref 30.0–36.0)
MCV: 105.2 fL — ABNORMAL HIGH (ref 80.0–100.0)
Monocytes Absolute: 0.8 10*3/uL (ref 0.1–1.0)
Monocytes Relative: 17 %
Neutro Abs: 2.2 10*3/uL (ref 1.7–7.7)
Neutrophils Relative %: 46 %
Platelets: 108 10*3/uL — ABNORMAL LOW (ref 150–400)
RBC: 3.49 MIL/uL — ABNORMAL LOW (ref 3.87–5.11)
RDW: 16.7 % — ABNORMAL HIGH (ref 11.5–15.5)
WBC: 4.7 10*3/uL (ref 4.0–10.5)
nRBC: 0 % (ref 0.0–0.2)

## 2022-09-28 LAB — COMPREHENSIVE METABOLIC PANEL
ALT: 21 U/L (ref 0–44)
AST: 18 U/L (ref 15–41)
Albumin: 4.1 g/dL (ref 3.5–5.0)
Alkaline Phosphatase: 50 U/L (ref 38–126)
Anion gap: 8 (ref 5–15)
BUN: 18 mg/dL (ref 8–23)
CO2: 28 mmol/L (ref 22–32)
Calcium: 8.8 mg/dL — ABNORMAL LOW (ref 8.9–10.3)
Chloride: 104 mmol/L (ref 98–111)
Creatinine, Ser: 0.96 mg/dL (ref 0.44–1.00)
GFR, Estimated: >60 mL/min (ref >60)
Glucose, Bld: 87 mg/dL (ref 70–99)
Potassium: 4.1 mmol/L (ref 3.5–5.1)
Sodium: 140 mmol/L (ref 135–145)
Total Bilirubin: 0.8 mg/dL (ref 0.3–1.2)
Total Protein: 6.5 g/dL (ref 6.5–8.1)

## 2022-09-28 LAB — MAGNESIUM: Magnesium: 1.9 mg/dL (ref 1.7–2.4)

## 2022-09-28 MED ORDER — PROCHLORPERAZINE MALEATE 10 MG PO TABS
10.0000 mg | ORAL_TABLET | Freq: Once | ORAL | Status: AC
  Administered 2022-09-28: 10 mg via ORAL
  Filled 2022-09-28: qty 1

## 2022-09-28 MED ORDER — BORTEZOMIB CHEMO SQ INJECTION 3.5 MG (2.5MG/ML)
1.0000 mg/m2 | Freq: Once | INTRAMUSCULAR | Status: AC
  Administered 2022-09-28: 1.75 mg via SUBCUTANEOUS
  Filled 2022-09-28: qty 0.7

## 2022-09-28 MED ORDER — PROCHLORPERAZINE MALEATE 10 MG PO TABS: 10.0000 mg | ORAL_TABLET | Freq: Four times a day (QID) | ORAL | Status: DC | PRN

## 2022-09-28 MED ORDER — DEXAMETHASONE 4 MG PO TABS
20.0000 mg | ORAL_TABLET | Freq: Once | ORAL | Status: AC
  Administered 2022-09-28: 20 mg via ORAL
  Filled 2022-09-28: qty 5

## 2022-09-28 NOTE — Progress Notes (Signed)
Velcade given today per MD orders. Tolerated infusion without adverse affects. Vital signs stable. No complaints at this time. Discharged from clinic ambulatory in stable condition. Alert and oriented x 3. F/U with [facility] as scheduled.

## 2022-09-28 NOTE — Progress Notes (Signed)
Patient presented for treatment today. Labs reviewed and meet parameters for treatment. No new issues or concerns from patient.

## 2022-09-28 NOTE — Patient Instructions (Signed)
[facility] [place]  Discharge Instructions: Thank you for choosing [facility] to provide your oncology and hematology care.  If you have a lab appointment with the Cancer Center - please note that after April 8th, 2024, all labs will be drawn in the cancer center.  You do not have to check in or register with the main entrance as you have in the past but will complete your check-in in the cancer center.  Wear comfortable clothing and clothing appropriate for easy access to any Portacath or PICC line.   We strive to give you quality time with your provider. You may need to reschedule your appointment if you arrive late (15 or more minutes).  Arriving late affects you and other patients whose appointments are after yours.  Also, if you miss three or more appointments without notifying the office, you may be dismissed from the clinic at the provider's discretion.      For prescription refill requests, have your pharmacy contact our office and allow 72 hours for refills to be completed.    Today you received the following chemotherapy and/or immunotherapy agents Velcade. Bortezomib Injection What is this medication? BORTEZOMIB (bor TEZ oh mib) treats lymphoma. It may also be used to treat multiple myeloma, a type of bone marrow cancer. It works by blocking a protein that causes cancer cells to grow and multiply. This helps to slow or stop the spread of cancer cells. This medicine may be used for other purposes; ask your health care provider or pharmacist if you have questions. COMMON BRAND NAME(S): Velcade What should I tell my care team before I take this medication? They need to know if you have any of these conditions: Dehydration Diabetes Heart disease Liver disease Tingling of the fingers or toes or other nerve disorder An unusual or allergic reaction to bortezomib, other medications, foods, dyes, or preservatives If you or your partner are pregnant or trying to  get pregnant Breastfeeding How should I use this medication? This medication is injected into a vein or under the skin. It is given by your care team in a hospital or clinic setting. Talk to your care team about the use of this medication in children. Special care may be needed. Overdosage: If you think you have taken too much of this medicine contact a poison control center or emergency room at once. NOTE: This medicine is only for you. Do not share this medicine with others. What if I miss a dose? Keep appointments for follow-up doses. It is important not to miss your dose. Call your care team if you are unable to keep an appointment. What may interact with this medication? Ketoconazole Rifampin This list may not describe all possible interactions. Give your health care provider a list of all the medicines, herbs, non-prescription drugs, or dietary supplements you use. Also tell them if you smoke, drink alcohol, or use illegal drugs. Some items may interact with your medicine. What should I watch for while using this medication? Your condition will be monitored carefully while you are receiving this medication. You may need blood work while taking this medication. This medication may affect your coordination, reaction time, or judgment. Do not drive or operate machinery until you know how this medication affects you. Sit up or stand slowly to reduce the risk of dizzy or fainting spells. Drinking alcohol with this medication can increase the risk of these side effects. This medication may increase your risk of getting an infection. Call   your care team for advice if you get a fever, chills, sore throat, or other symptoms of a cold or flu. Do not treat yourself. Try to avoid being around people who are sick. Check with your care team if you have severe diarrhea, nausea, and vomiting, or if you sweat a lot. The loss of too much body fluid may make it dangerous for you to take this medication. Talk to  your care team if you may be pregnant. Serious birth defects can occur if you take this medication during pregnancy and for 7 months after the last dose. You will need a negative pregnancy test before starting this medication. Contraception is recommended while taking this medication and for 7 months after the last dose. Your care team can help you find the option that works for you. If your partner can get pregnant, use a condom during sex while taking this medication and for 4 months after the last dose. Do not breastfeed while taking this medication and for 2 months after the last dose. This medication may cause infertility. Talk to your care team if you are concerned about your fertility. What side effects may I notice from receiving this medication? Side effects that you should report to your care team as soon as possible: Allergic reactions--skin rash, itching, hives, swelling of the face, lips, tongue, or throat Bleeding--bloody or black, tar-like stools, vomiting blood or brown material that looks like coffee grounds, red or dark brown urine, small red or purple spots on skin, unusual bruising or bleeding Bleeding in the brain--severe headache, stiff neck, confusion, dizziness, change in vision, numbness or weakness of the face, arm, or leg, trouble speaking, trouble walking, vomiting Bowel blockage--stomach cramping, unable to have a bowel movement or pass gas, loss of appetite, vomiting Heart failure--shortness of breath, swelling of the ankles, feet, or hands, sudden weight gain, unusual weakness or fatigue Infection--fever, chills, cough, sore throat, wounds that don't heal, pain or trouble when passing urine, general feeling of discomfort or being unwell Liver injury--right upper belly pain, loss of appetite, nausea, light-colored stool, dark yellow or brown urine, yellowing skin or eyes, unusual weakness or fatigue Low blood pressure--dizziness, feeling faint or lightheaded, blurry  vision Lung injury--shortness of breath or trouble breathing, cough, spitting up blood, chest pain, fever Pain, tingling, or numbness in the hands or feet Severe or prolonged diarrhea Stomach pain, bloody diarrhea, pale skin, unusual weakness or fatigue, decrease in the amount of urine, which may be signs of hemolytic uremic syndrome Sudden and severe headache, confusion, change in vision, seizures, which may be signs of posterior reversible encephalopathy syndrome (PRES) TTP--purple spots on the skin or inside the mouth, pale skin, yellowing skin or eyes, unusual weakness or fatigue, fever, fast or irregular heartbeat, confusion, change in vision, trouble speaking, trouble walking Tumor lysis syndrome (TLS)--nausea, vomiting, diarrhea, decrease in the amount of urine, dark urine, unusual weakness or fatigue, confusion, muscle pain or cramps, fast or irregular heartbeat, joint pain Side effects that usually do not require medical attention (report to your care team if they continue or are bothersome): Constipation Diarrhea Fatigue Loss of appetite Nausea This list may not describe all possible side effects. Call your doctor for medical advice about side effects. You may report side effects to FDA at 1-800-FDA-1088. Where should I keep my medication? This medication is given in a hospital or clinic. It will not be stored at home. NOTE: This sheet is a summary. It may not cover all possible information. If   you have questions about this medicine, talk to your doctor, pharmacist, or health care provider.  2023 Elsevier/Gold Standard (2021-10-06 00:00:00)       To help prevent nausea and vomiting after your treatment, we encourage you to take your nausea medication as directed.  BELOW ARE SYMPTOMS THAT SHOULD BE REPORTED IMMEDIATELY: *FEVER GREATER THAN 100.4 F (38 C) OR HIGHER *CHILLS OR SWEATING *NAUSEA AND VOMITING THAT IS NOT CONTROLLED WITH YOUR NAUSEA MEDICATION *UNUSUAL SHORTNESS OF  BREATH *UNUSUAL BRUISING OR BLEEDING *URINARY PROBLEMS (pain or burning when urinating, or frequent urination) *BOWEL PROBLEMS (unusual diarrhea, constipation, pain near the anus) TENDERNESS IN MOUTH AND THROAT WITH OR WITHOUT PRESENCE OF ULCERS (sore throat, sores in mouth, or a toothache) UNUSUAL RASH, SWELLING OR PAIN  UNUSUAL VAGINAL DISCHARGE OR ITCHING   Items with * indicate a potential emergency and should be followed up as soon as possible or go to the Emergency Department if any problems should occur.  Please show the CHEMOTHERAPY ALERT CARD or IMMUNOTHERAPY ALERT CARD at check-in to the Emergency Department and triage nurse.  Should you have questions after your visit or need to cancel or reschedule your appointment, please contact [facility] [phone]  and follow the prompts.  Office hours are 8:00 a.m. to 4:30 p.m. Monday - Friday. Please note that voicemails left after 4:00 p.m. may not be returned until the following business day.  We are closed weekends and major holidays. You have access to a nurse at all times for urgent questions. Please call the main number to the clinic [phone] and follow the prompts.  For any non-urgent questions, you may also contact your provider using MyChart. We now offer e-Visits for anyone 18 and older to request care online for non-urgent symptoms. For details visit [email or web address].   Also download the MyChart app! Go to the app store, search "MyChart", open the app, select [facility], and log in with your MyChart username and password.   

## 2022-09-30 ENCOUNTER — Encounter: Payer: Self-pay | Admitting: Family Medicine

## 2022-09-30 ENCOUNTER — Ambulatory Visit (INDEPENDENT_AMBULATORY_CARE_PROVIDER_SITE_OTHER): Payer: Medicare Other | Admitting: Family Medicine

## 2022-09-30 VITALS — BP 113/72 | HR 80 | Ht 63.0 in | Wt 162.1 lb

## 2022-09-30 DIAGNOSIS — J301 Allergic rhinitis due to pollen: Secondary | ICD-10-CM

## 2022-09-30 DIAGNOSIS — Z604 Social exclusion and rejection: Secondary | ICD-10-CM | POA: Insufficient documentation

## 2022-09-30 MED ORDER — LEVOCETIRIZINE DIHYDROCHLORIDE 5 MG PO TABS
5.0000 mg | ORAL_TABLET | Freq: Every evening | ORAL | 1 refills | Status: DC
Start: 2022-09-30 — End: 2022-10-18

## 2022-09-30 MED ORDER — FLUTICASONE PROPIONATE 50 MCG/ACT NA SUSP
2.0000 | Freq: Every day | NASAL | 0 refills | Status: DC
Start: 2022-09-30 — End: 2022-10-27

## 2022-09-30 MED ORDER — MONTELUKAST SODIUM 10 MG PO TABS
10.0000 mg | ORAL_TABLET | Freq: Every day | ORAL | 3 refills | Status: DC
Start: 2022-09-30 — End: 2023-02-10

## 2022-09-30 NOTE — Patient Instructions (Addendum)
I appreciate the opportunity to provide care to you today!    Follow up:  10/24/2022  Seasonal allergies Please start taking Xyzal 5 mg at bedtime Singulair 10 mg and Flonase as needed for nasal congestion  Caregiver burden I recommend calling your insurance to see if they can cover respite care for your husband  A few places that offer respite care or: [facility] [phone], always [facility] [phone]     Please continue to a heart-healthy diet and increase your physical activities. Try to exercise for at least five days a week.      It was a pleasure to see you and I look forward to continuing to work together on your health and well-being. Please do not hesitate to call the office if you need care or have questions about your care.   Have a wonderful day and week. With Gratitude, Gilmore Laroche MSN, FNP-BC

## 2022-09-30 NOTE — Assessment & Plan Note (Signed)
Will treat today with Xyzal 5 mg nightly, Singulair 10 mg daily and Flonase nasal spray

## 2022-09-30 NOTE — Assessment & Plan Note (Signed)
PHQ-9 is 6 Encouraged to inquire about respite care Provided information on respite care Encouraged if possible to get engage in elderly social groups No reports of suicidal thoughts ideation

## 2022-09-30 NOTE — Progress Notes (Signed)
Acute Office Visit  Subjective:    Patient ID: Courtney Rogers, female    DOB: 12/02/1950, 72 y.o.   MRN: [id number]  Chief Complaint  Patient presents with   seasonal allergies    Pt reports flare up of seasonal allergies, reports runny nose, left eye watering.    Depression    Pt would like to discuss depression, states her husband has dementia and sister has trouble hearing doesn't really do a lot of communication, has concerns.     HPI Patient is in today for with complaints of seasonal allergies and  feeling depressed.  The patient reports that she is the primary caregiver for her sister who cannot communicate and her husband who has dementia.  She reports social isolation noting that most of her friends are no longer alive.  She reports not having anyone to speak to and, at times, feeling caregiver burden.  Past Medical History:  Diagnosis Date   Acute myocardial infarction [facility] 2009   CAD/no stent medically managed   Anxiety disorder    CAD (coronary artery disease)    Cancer (HCC)    multiple myeloma   Hyperlipidemia    Hypertension    IBS (irritable bowel syndrome)    Non-ulcer dyspepsia 04/30/2009   Qualifier: Diagnosis of  By: Darrick Penna MD, Sandi L    Overactive bladder    PE (pulmonary embolism) 10/2012   left lung   Presence of permanent cardiac pacemaker    Sleep apnea     Past Surgical History:  Procedure Laterality Date   ABDOMINAL HYSTERECTOMY     BSO secondary to cyst     CARDIAC CATHETERIZATION     COLONOSCOPY  12/2009   Dr. Aleene Davidson, propofol, normal. Next TCS 12/2019   COLONOSCOPY N/A 05/22/2017   Examined portion ileum was normal, significant looping of colon, external hemorrhoids and rectal bleeding due to internal hemorrhoids, mild diverticulosis procedure: COLONOSCOPY;  Surgeon: West Bali, MD;  [facility];  Service: Endoscopy;  Laterality: N/A;  10:30am   ESOPHAGOGASTRODUODENOSCOPY  05/11/2009   schatzki ring/small hiatal  hernia/path:gastritis   ESOPHAGOGASTRODUODENOSCOPY N/A 05/22/2017   Multiple gastric polyps with biopsy benign fundic gland polyp, small bowel biopsy negative for celiac, gastric biopsy with mild gastritis but no H. pylori procedure: ESOPHAGOGASTRODUODENOSCOPY (EGD);  Surgeon: West Bali, MD;  [facility];  Service: Endoscopy;  Laterality: N/A;   ESOPHAGOGASTRODUODENOSCOPY (EGD) WITH ESOPHAGEAL DILATION N/A 04/01/2013   AOZ:HYQMVHQIO at the gastroesophageal juction/multiple small polyps/mild gastritis   GIVENS CAPSULE STUDY N/A 06/07/2017   Frequent gastric erosions, normal small bowel mucosa.  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    Family History  Problem Relation Age of Onset   Colon polyps Neg Hx    Colon cancer Neg Hx     Social History   Socioeconomic History   Marital status: Married    Spouse name: [name]   Number of children: 3   Years of education: 16   Highest education level: Not on file  Occupational History   Not on file  Tobacco Use   Smoking status: Never    Passive exposure: Never   Smokeless tobacco: Never   Tobacco comments:    Never smoked   Vaping  Use   Vaping Use: Never used  Substance and Sexual Activity   Alcohol use: Not Currently    Comment: occ wine   Drug use: No   Sexual activity: Not Currently  Other Topics Concern   Not on file  Social History Narrative      Lives with husband-51 years 952 in Aug 2021   Daughter is close by, 2 sons live further away   One IN [place],ONE IN [place], ONE BESIDE HER.        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   Enjoys: reading, young adult      Diet: eats all food groups    Caffeine: coffee 1, tea daily soda-1 daily   Water: 1-2 cups      Wears seat belt     Does not use phone while driving   Smoke detectors at home    Licensed conveyancer -locked up      Left handed   One story home   Drinks caffeine   Social Determinants of Health   Financial Resource Strain: Low Risk  (11/15/2021)   Overall Financial Resource Strain (CARDIA)    Difficulty of Paying Living Expenses: Not hard at all  Food Insecurity: No Food Insecurity (11/15/2021)   Hunger Vital Sign    Worried About Running Out of Food in the Last Year: Never true    Ran Out of Food in the Last Year: Never true  Transportation Needs: No Transportation Needs (11/15/2021)   PRAPARE - Administrator, Civil Service (Medical): No    Lack of Transportation (Non-Medical): No  Physical Activity: Inactive (11/26/2021)   Exercise Vital Sign    Days of Exercise per Week: 0 days    Minutes of Exercise per Session: 0 min  Stress: Stress Concern Present (11/26/2021)   [employer] of Occupational Health - Occupational Stress Questionnaire    Feeling of Stress : To some extent  Social Connections: Moderately Integrated (11/11/2020)   Social Connection and Isolation Panel [NHANES]    Frequency of Communication with Friends and Family: Once a week    Frequency of Social Gatherings with Friends and Family: Once a week    Attends Religious Services: More than 4 times per year    Active Member of [employer] or Organizations: Yes    Attends Banker Meetings: More than 4 times per year    Marital Status: Married  Catering manager Violence: Not At Risk (11/11/2020)   Humiliation, Afraid, Rape, and Kick questionnaire    Fear of Current or Ex-Partner: No    Emotionally Abused: No    Physically Abused: No    Sexually Abused: No    Outpatient Medications Prior to Visit  Medication Sig Dispense Refill   acetaminophen (TYLENOL) 500 MG tablet Take 1,000 mg by mouth 2 (two) times daily as needed for moderate pain or headache.     acyclovir (ZOVIRAX) 400 MG tablet TAKE 1  TABLET BY MOUTH TWICE DAILY 60 tablet 6   albuterol (VENTOLIN HFA) 108 (90 Base) MCG/ACT inhaler USE 2 PUFFS ONE TO TWO TIMES A DAY. 18 g 5   amLODipine (NORVASC) 10 MG tablet TAKE 1 TABLET BY MOUTH ONCE DAILY. 90 tablet 0   ascorbic acid (VITAMIN C) 500 MG tablet Take 500 mg by mouth daily.     aspirin EC 81 MG tablet Take 81 mg by mouth at bedtime.     B Complex Vitamins (VITAMIN B COMPLEX) TABS Take  1 tablet by mouth daily.     B Complex-Folic Acid (B COMPLEX VITAMINS, W/ FA,) CAPS Take 1 capsule by mouth daily.     bortezomib SQ (VELCADE) 3.5 MG injection Inject into the skin once.     busPIRone (BUSPAR) 5 MG tablet Take 1 tablet (5 mg total) by mouth daily. 30 tablet 3   Calcium Carbonate-Vitamin D (CALCIUM 600+D PO) Take 1 tablet by mouth 2 (two) times daily.     chlorthalidone (HYGROTON) 25 MG tablet Take 25 mg by mouth daily.     cholecalciferol 25 MCG (1000 UT) tablet Take by mouth.     cyanocobalamin (VITAMIN B12) 1000 MCG tablet Take by mouth.     cyclobenzaprine (FLEXERIL) 10 MG tablet TAKE ONE-HALF TABLET BY MOUTH TWICE DAILY AS NEEDED FOR MUSCLE SPASMS 20 tablet 0   Daratumumab-Hyaluronidase-fihj (DARZALEX FASPRO [place]) Inject into the skin.     diclofenac Sodium (VOLTAREN) 1 % GEL Apply 4 g topically 4 (four) times daily. 50 g 0   doxycycline (VIBRAMYCIN) 100 MG capsule Take 100 mg by mouth daily.     fluticasone-salmeterol (ADVAIR) 250-50 MCG/ACT AEPB Inhale into the lungs.     folic acid (FOLVITE) 1 MG tablet TAKE 1 TABLET BY MOUTH ONCE DAILY. 30 tablet 11   gabapentin (NEURONTIN) 100 MG capsule Take 100 mg by mouth 3 (three) times daily.     gabapentin (NEURONTIN) 600 MG tablet Take by mouth.     hydrochlorothiazide (MICROZIDE) 12.5 MG capsule Take 12.5 mg by mouth daily.     HYDROcodone-acetaminophen (NORCO/VICODIN) 5-325 MG tablet Take 1 tablet by mouth every 8 (eight) hours as needed for moderate pain. 60 tablet 0   Hyoscyamine Sulfate SL 0.125 MG SUBL      lenalidomide  (REVLIMID) 10 MG capsule Take 1 capsule (10 mg total) by mouth daily. 21 days on, 7 days off every 28 days 21 capsule 0   lidocaine (HM LIDOCAINE PATCH) 4 % Place 1 patch onto the skin daily. 14 patch 0   lidocaine (XYLOCAINE) 2 % solution Take 2 teaspoons before meals and at bedtime as needed. May repeat every 4 hours for a maximum dose of 8 per day. 300 mL 0   Lidocaine HCl (XOLIDO) 2 % CREA Apply 1 application topically daily as needed (arthritisis).     loperamide (IMODIUM) 2 MG capsule Take 4 mg by mouth as needed. Take 2 tablets after the first loose stool and then 1 tablet after each loose stool.  Do not exceed 8 capsules within 24 hours.     losartan (COZAAR) 100 MG tablet      magnesium oxide (MAG-OX) 400 (240 Mg) MG tablet Take 1 tablet (400 mg total) by mouth 2 (two) times daily. 60 tablet 4   metoprolol succinate (TOPROL-XL) 50 MG 24 hr tablet      metoprolol tartrate (LOPRESSOR) 50 MG tablet Take 1 tablet (50 mg total) by mouth 2 (two) times daily. (Patient taking differently: Take 25 mg by mouth 2 (two) times daily.) 180 tablet 1   mirtazapine (REMERON) 15 MG tablet Take 15 mg by mouth at bedtime.     montelukast (SINGULAIR) 10 MG tablet Take 10 mg by mouth daily.      MYRBETRIQ 25 MG TB24 tablet TAKE ONE TABLET BY MOUTH ONCE DAILY 30 tablet 0   nitroGLYCERIN (NITROSTAT) 0.4 MG SL tablet Place 0.4 mg under the tongue every 5 (five) minutes as needed for chest pain.     pantoprazole (PROTONIX) 40  MG tablet Take 40 mg by mouth daily.     potassium chloride (KLOR-CON M) 10 MEQ tablet      potassium chloride SA (KLOR-CON M) 20 MEQ tablet Take 1 tablet (20 mEq total) by mouth 3 (three) times daily. 21 tablet 0   primidone (MYSOLINE) 50 MG tablet Take 75 mg by mouth daily.     primidone (MYSOLINE) 50 MG tablet Take by mouth.     Probiotic Product (GNP PROBIOTIC COLON SUPPORT) CAPS Take 1 capsule by mouth daily.     sertraline (ZOLOFT) 100 MG tablet Take 200 mg by mouth daily.       simvastatin (ZOCOR) 40 MG tablet Take 1 tablet (40 mg total) by mouth at bedtime. 90 tablet 1   tadalafil, PAH, (ALYQ) 20 MG tablet Take 2 tablets every day by oral route.     traMADol (ULTRAM) 50 MG tablet Take 50 mg by mouth 2 (two) times daily.     trimethoprim-polymyxin b (POLYTRIM) ophthalmic solution INSTILL 1 DROP INTO AFFECTED EYE(S) BY OPHTHALMIC ROUTE EVERY 6 HOURS FOR 7 DAYS     warfarin (COUMADIN) 3 MG tablet Take 3 mg by mouth daily.     fluticasone (FLONASE) 50 MCG/ACT nasal spray USE 2 SPRAYS IN EACH NOSTRIL ONCE DAILY. 16 g 0   hydrOXYzine (VISTARIL) 25 MG capsule      No facility-administered medications prior to visit.    Allergies  Allergen Reactions   Amoxicillin-Pot Clavulanate Other (See Comments)   Clarithromycin Other (See Comments)    Stomach problems   Erythromycin    Lisinopril Swelling   Amoxicillin Diarrhea    Review of Systems  Constitutional:  Negative for chills and fever.  HENT:  Positive for congestion. Negative for sinus pressure.        Watery eyes  Eyes:  Negative for visual disturbance.  Respiratory:  Negative for chest tightness and shortness of breath.   Neurological:  Negative for dizziness and headaches.  Psychiatric/Behavioral:  Negative for suicidal ideas.        Objective:    Physical Exam HENT:     Head: Normocephalic.     Mouth/Throat:     Mouth: Mucous membranes are moist.  Cardiovascular:     Rate and Rhythm: Normal rate.     Heart sounds: Normal heart sounds.  Pulmonary:     Effort: Pulmonary effort is normal.     Breath sounds: Normal breath sounds.  Neurological:     Mental Status: She is alert.     BP 113/72   Pulse 80   Ht 5\' 3"  (1.6 m)   Wt 162 lb 1.3 oz (73.5 kg)   SpO2 98%   BMI 28.71 kg/m  Wt Readings from Last 3 Encounters:  09/30/22 162 lb 1.3 oz (73.5 kg)  09/28/22 159 lb 9.6 oz (72.4 kg)  09/14/22 166 lb 4.8 oz (75.4 kg)       Assessment & Plan:  Social isolation Assessment & Plan: PHQ-9  is 6 Encouraged to inquire about respite care Provided information on respite care Encouraged if possible to get engage in elderly social groups No reports of suicidal thoughts ideation   Non-seasonal allergic rhinitis due to pollen Assessment & Plan: Will treat today with Xyzal 5 mg nightly, Singulair 10 mg daily and Flonase nasal spray   Orders: -     Levocetirizine Dihydrochloride; Take 1 tablet (5 mg total) by mouth every evening.  Dispense: 30 tablet; Refill: 1 -     Fluticasone  Propionate; Place 2 sprays into both nostrils daily.  Dispense: 16 g; Refill: 0 -     Montelukast Sodium; Take 1 tablet (10 mg total) by mouth at bedtime.  Dispense: 30 tablet; Refill: 3    Gilmore Laroche, FNP

## 2022-10-02 ENCOUNTER — Other Ambulatory Visit: Payer: Self-pay | Admitting: Family Medicine

## 2022-10-02 DIAGNOSIS — R32 Unspecified urinary incontinence: Secondary | ICD-10-CM

## 2022-10-03 ENCOUNTER — Other Ambulatory Visit: Payer: Self-pay | Admitting: *Deleted

## 2022-10-03 MED ORDER — MAGNESIUM OXIDE -MG SUPPLEMENT 400 (240 MG) MG PO TABS: 400.0000 mg | ORAL_TABLET | Freq: Two times a day (BID) | ORAL | 4 refills | Status: DC

## 2022-10-08 ENCOUNTER — Other Ambulatory Visit: Payer: Self-pay | Admitting: Hematology

## 2022-10-10 ENCOUNTER — Encounter (HOSPITAL_COMMUNITY): Payer: Self-pay | Admitting: Hematology

## 2022-10-10 ENCOUNTER — Encounter: Payer: Self-pay | Admitting: Hematology

## 2022-10-12 ENCOUNTER — Inpatient Hospital Stay: Payer: Medicare Other

## 2022-10-12 VITALS — BP 133/67 | HR 65 | Temp 96.3°F | Resp 18 | Wt 161.0 lb

## 2022-10-12 DIAGNOSIS — C9000 Multiple myeloma not having achieved remission: Secondary | ICD-10-CM

## 2022-10-12 DIAGNOSIS — Z5112 Encounter for antineoplastic immunotherapy: Secondary | ICD-10-CM | POA: Diagnosis not present

## 2022-10-12 LAB — CBC WITH DIFFERENTIAL/PLATELET
Abs Immature Granulocytes: 0.01 10*3/uL (ref 0.00–0.07)
Basophils Absolute: 0.1 10*3/uL (ref 0.0–0.1)
Basophils Relative: 2 %
Eosinophils Absolute: 0.2 10*3/uL (ref 0.0–0.5)
Eosinophils Relative: 6 %
HCT: 35.1 % — ABNORMAL LOW (ref 36.0–46.0)
Hemoglobin: 11.8 g/dL — ABNORMAL LOW (ref 12.0–15.0)
Immature Granulocytes: 0 %
Lymphocytes Relative: 29 %
Lymphs Abs: 1 10*3/uL (ref 0.7–4.0)
MCH: 34.9 pg — ABNORMAL HIGH (ref 26.0–34.0)
MCHC: 33.6 g/dL (ref 30.0–36.0)
MCV: 103.8 fL — ABNORMAL HIGH (ref 80.0–100.0)
Monocytes Absolute: 0.3 10*3/uL (ref 0.1–1.0)
Monocytes Relative: 8 %
Neutro Abs: 1.8 10*3/uL (ref 1.7–7.7)
Neutrophils Relative %: 55 %
Platelets: 94 10*3/uL — ABNORMAL LOW (ref 150–400)
RBC: 3.38 MIL/uL — ABNORMAL LOW (ref 3.87–5.11)
RDW: 16.5 % — ABNORMAL HIGH (ref 11.5–15.5)
WBC: 3.2 10*3/uL — ABNORMAL LOW (ref 4.0–10.5)
nRBC: 0 % (ref 0.0–0.2)

## 2022-10-12 LAB — COMPREHENSIVE METABOLIC PANEL
ALT: 22 U/L (ref 0–44)
AST: 21 U/L (ref 15–41)
Albumin: 4.1 g/dL (ref 3.5–5.0)
Alkaline Phosphatase: 45 U/L (ref 38–126)
Anion gap: 9 (ref 5–15)
BUN: 18 mg/dL (ref 8–23)
CO2: 28 mmol/L (ref 22–32)
Calcium: 8.8 mg/dL — ABNORMAL LOW (ref 8.9–10.3)
Chloride: 103 mmol/L (ref 98–111)
Creatinine, Ser: 0.82 mg/dL (ref 0.44–1.00)
GFR, Estimated: >60 mL/min (ref >60)
Glucose, Bld: 98 mg/dL (ref 70–99)
Potassium: 3.6 mmol/L (ref 3.5–5.1)
Sodium: 140 mmol/L (ref 135–145)
Total Bilirubin: 0.5 mg/dL (ref 0.3–1.2)
Total Protein: 6.4 g/dL — ABNORMAL LOW (ref 6.5–8.1)

## 2022-10-12 MED ORDER — PROCHLORPERAZINE MALEATE 10 MG PO TABS
10.0000 mg | ORAL_TABLET | Freq: Once | ORAL | Status: AC
  Administered 2022-10-12: 10 mg via ORAL
  Filled 2022-10-12: qty 1

## 2022-10-12 MED ORDER — BORTEZOMIB CHEMO SQ INJECTION 3.5 MG (2.5MG/ML)
1.0000 mg/m2 | Freq: Once | INTRAMUSCULAR | Status: AC
  Administered 2022-10-12: 1.75 mg via SUBCUTANEOUS
  Filled 2022-10-12: qty 0.7

## 2022-10-12 NOTE — Patient Instructions (Signed)
[facility]  Discharge Instructions: Thank you for choosing [facility] to provide your oncology and hematology care.  If you have a lab appointment with the Cancer Center - please note that after April 8th, 2024, all labs will be drawn in the cancer center.  You do not have to check in or register with the main entrance as you have in the past but will complete your check-in in the cancer center.  Wear comfortable clothing and clothing appropriate for easy access to any Portacath or PICC line.   We strive to give you quality time with your provider. You may need to reschedule your appointment if you arrive late (15 or more minutes).  Arriving late affects you and other patients whose appointments are after yours.  Also, if you miss three or more appointments without notifying the office, you may be dismissed from the clinic at the provider's discretion.      For prescription refill requests, have your pharmacy contact our office and allow 72 hours for refills to be completed.    Today you received the following chemotherapy and/or immunotherapy agents Velcade. Bortezomib Injection What is this medication? BORTEZOMIB (bor TEZ oh mib) treats lymphoma. It may also be used to treat multiple myeloma, a type of bone marrow cancer. It works by blocking a protein that causes cancer cells to grow and multiply. This helps to slow or stop the spread of cancer cells. This medicine may be used for other purposes; ask your health care provider or pharmacist if you have questions. COMMON BRAND NAME(S): Velcade What should I tell my care team before I take this medication? They need to know if you have any of these conditions: Dehydration Diabetes Heart disease Liver disease Tingling of the fingers or toes or other nerve disorder An unusual or allergic reaction to bortezomib, other medications, foods, dyes, or preservatives If you or your partner are pregnant or trying to  get pregnant Breastfeeding How should I use this medication? This medication is injected into a vein or under the skin. It is given by your care team in a hospital or clinic setting. Talk to your care team about the use of this medication in children. Special care may be needed. Overdosage: If you think you have taken too much of this medicine contact a poison control center or emergency room at once. NOTE: This medicine is only for you. Do not share this medicine with others. What if I miss a dose? Keep appointments for follow-up doses. It is important not to miss your dose. Call your care team if you are unable to keep an appointment. What may interact with this medication? Ketoconazole Rifampin This list may not describe all possible interactions. Give your health care provider a list of all the medicines, herbs, non-prescription drugs, or dietary supplements you use. Also tell them if you smoke, drink alcohol, or use illegal drugs. Some items may interact with your medicine. What should I watch for while using this medication? Your condition will be monitored carefully while you are receiving this medication. You may need blood work while taking this medication. This medication may affect your coordination, reaction time, or judgment. Do not drive or operate machinery until you know how this medication affects you. Sit up or stand slowly to reduce the risk of dizzy or fainting spells. Drinking alcohol with this medication can increase the risk of these side effects. This medication may increase your risk of getting an infection. Call   your care team for advice if you get a fever, chills, sore throat, or other symptoms of a cold or flu. Do not treat yourself. Try to avoid being around people who are sick. Check with your care team if you have severe diarrhea, nausea, and vomiting, or if you sweat a lot. The loss of too much body fluid may make it dangerous for you to take this medication. Talk to  your care team if you may be pregnant. Serious birth defects can occur if you take this medication during pregnancy and for 7 months after the last dose. You will need a negative pregnancy test before starting this medication. Contraception is recommended while taking this medication and for 7 months after the last dose. Your care team can help you find the option that works for you. If your partner can get pregnant, use a condom during sex while taking this medication and for 4 months after the last dose. Do not breastfeed while taking this medication and for 2 months after the last dose. This medication may cause infertility. Talk to your care team if you are concerned about your fertility. What side effects may I notice from receiving this medication? Side effects that you should report to your care team as soon as possible: Allergic reactions--skin rash, itching, hives, swelling of the face, lips, tongue, or throat Bleeding--bloody or black, tar-like stools, vomiting blood or brown material that looks like coffee grounds, red or dark brown urine, small red or purple spots on skin, unusual bruising or bleeding Bleeding in the brain--severe headache, stiff neck, confusion, dizziness, change in vision, numbness or weakness of the face, arm, or leg, trouble speaking, trouble walking, vomiting Bowel blockage--stomach cramping, unable to have a bowel movement or pass gas, loss of appetite, vomiting Heart failure--shortness of breath, swelling of the ankles, feet, or hands, sudden weight gain, unusual weakness or fatigue Infection--fever, chills, cough, sore throat, wounds that don't heal, pain or trouble when passing urine, general feeling of discomfort or being unwell Liver injury--right upper belly pain, loss of appetite, nausea, light-colored stool, dark yellow or brown urine, yellowing skin or eyes, unusual weakness or fatigue Low blood pressure--dizziness, feeling faint or lightheaded, blurry  vision Lung injury--shortness of breath or trouble breathing, cough, spitting up blood, chest pain, fever Pain, tingling, or numbness in the hands or feet Severe or prolonged diarrhea Stomach pain, bloody diarrhea, pale skin, unusual weakness or fatigue, decrease in the amount of urine, which may be signs of hemolytic uremic syndrome Sudden and severe headache, confusion, change in vision, seizures, which may be signs of posterior reversible encephalopathy syndrome (PRES) TTP--purple spots on the skin or inside the mouth, pale skin, yellowing skin or eyes, unusual weakness or fatigue, fever, fast or irregular heartbeat, confusion, change in vision, trouble speaking, trouble walking Tumor lysis syndrome (TLS)--nausea, vomiting, diarrhea, decrease in the amount of urine, dark urine, unusual weakness or fatigue, confusion, muscle pain or cramps, fast or irregular heartbeat, joint pain Side effects that usually do not require medical attention (report to your care team if they continue or are bothersome): Constipation Diarrhea Fatigue Loss of appetite Nausea This list may not describe all possible side effects. Call your doctor for medical advice about side effects. You may report side effects to FDA at 1-800-FDA-1088. Where should I keep my medication? This medication is given in a hospital or clinic. It will not be stored at home. NOTE: This sheet is a summary. It may not cover all possible information. If   you have questions about this medicine, talk to your doctor, pharmacist, or health care provider.  2023 Elsevier/Gold Standard (2021-10-06 00:00:00)       To help prevent nausea and vomiting after your treatment, we encourage you to take your nausea medication as directed.  BELOW ARE SYMPTOMS THAT SHOULD BE REPORTED IMMEDIATELY: *FEVER GREATER THAN 100.4 F (38 C) OR HIGHER *CHILLS OR SWEATING *NAUSEA AND VOMITING THAT IS NOT CONTROLLED WITH YOUR NAUSEA MEDICATION *UNUSUAL SHORTNESS OF  BREATH *UNUSUAL BRUISING OR BLEEDING *URINARY PROBLEMS (pain or burning when urinating, or frequent urination) *BOWEL PROBLEMS (unusual diarrhea, constipation, pain near the anus) TENDERNESS IN MOUTH AND THROAT WITH OR WITHOUT PRESENCE OF ULCERS (sore throat, sores in mouth, or a toothache) UNUSUAL RASH, SWELLING OR PAIN  UNUSUAL VAGINAL DISCHARGE OR ITCHING   Items with * indicate a potential emergency and should be followed up as soon as possible or go to the Emergency Department if any problems should occur.  Please show the CHEMOTHERAPY ALERT CARD or IMMUNOTHERAPY ALERT CARD at check-in to the Emergency Department and triage nurse.  Should you have questions after your visit or need to cancel or reschedule your appointment, please contact [facility] [phone]  and follow the prompts.  Office hours are 8:00 a.m. to 4:30 p.m. Monday - Friday. Please note that voicemails left after 4:00 p.m. may not be returned until the following business day.  We are closed weekends and major holidays. You have access to a nurse at all times for urgent questions. Please call the main number to the clinic [phone] and follow the prompts.  For any non-urgent questions, you may also contact your provider using MyChart. We now offer e-Visits for anyone 18 and older to request care online for non-urgent symptoms. For details visit [email or web address].   Also download the MyChart app! Go to the app store, search "MyChart", open the app, select [facility], and log in with your MyChart username and password.   

## 2022-10-12 NOTE — Progress Notes (Signed)
Patient presents today for Velcade injection. Labs within parameters for treatment. Vital signs within parameters for treatment.   Velcade injection given today per MD orders. Tolerated without adverse affects. Vital signs stable. No complaints at this time. Discharged from clinic ambulatory in stable condition. Alert and oriented x 3. F/U with [facility] as scheduled.

## 2022-10-13 LAB — KAPPA/LAMBDA LIGHT CHAINS
Kappa free light chain: 9.3 mg/L (ref 3.3–19.4)
Kappa, lambda light chain ratio: 0.69 (ref 0.26–1.65)
Lambda free light chains: 13.4 mg/L (ref 5.7–26.3)

## 2022-10-14 LAB — PROTEIN ELECTROPHORESIS, SERUM
A/G Ratio: 1.9 — ABNORMAL HIGH (ref 0.7–1.7)
Albumin ELP: 3.8 g/dL (ref 2.9–4.4)
Alpha-1-Globulin: 0.2 g/dL (ref 0.0–0.4)
Alpha-2-Globulin: 0.6 g/dL (ref 0.4–1.0)
Beta Globulin: 0.8 g/dL (ref 0.7–1.3)
Gamma Globulin: 0.4 g/dL (ref 0.4–1.8)
Globulin, Total: 2 g/dL — ABNORMAL LOW (ref 2.2–3.9)
Total Protein ELP: 5.8 g/dL — ABNORMAL LOW (ref 6.0–8.5)

## 2022-10-18 ENCOUNTER — Other Ambulatory Visit: Payer: Self-pay | Admitting: Family Medicine

## 2022-10-18 ENCOUNTER — Other Ambulatory Visit: Payer: Self-pay | Admitting: Hematology

## 2022-10-18 DIAGNOSIS — J301 Allergic rhinitis due to pollen: Secondary | ICD-10-CM

## 2022-10-21 LAB — IMMUNOFIXATION ELECTROPHORESIS
IgA: 138 mg/dL (ref 64–422)
IgG (Immunoglobin G), Serum: 443 mg/dL — ABNORMAL LOW (ref 586–1602)
IgM (Immunoglobulin M), Srm: 20 mg/dL — ABNORMAL LOW (ref 26–217)
Total Protein ELP: 6.1 g/dL (ref 6.0–8.5)

## 2022-10-24 ENCOUNTER — Other Ambulatory Visit: Payer: Self-pay

## 2022-10-24 ENCOUNTER — Other Ambulatory Visit: Payer: Self-pay | Admitting: Family Medicine

## 2022-10-24 ENCOUNTER — Encounter: Payer: Self-pay | Admitting: Family Medicine

## 2022-10-24 ENCOUNTER — Ambulatory Visit (INDEPENDENT_AMBULATORY_CARE_PROVIDER_SITE_OTHER): Payer: Medicare Other | Admitting: Family Medicine

## 2022-10-24 VITALS — BP 132/80 | HR 88 | Ht 64.0 in | Wt 160.1 lb

## 2022-10-24 DIAGNOSIS — N3946 Mixed incontinence: Secondary | ICD-10-CM | POA: Diagnosis not present

## 2022-10-24 DIAGNOSIS — J302 Other seasonal allergic rhinitis: Secondary | ICD-10-CM | POA: Insufficient documentation

## 2022-10-24 DIAGNOSIS — T148XXA Other injury of unspecified body region, initial encounter: Secondary | ICD-10-CM | POA: Insufficient documentation

## 2022-10-24 DIAGNOSIS — I10 Essential (primary) hypertension: Secondary | ICD-10-CM

## 2022-10-24 DIAGNOSIS — N3281 Overactive bladder: Secondary | ICD-10-CM | POA: Insufficient documentation

## 2022-10-24 DIAGNOSIS — N8111 Cystocele, midline: Secondary | ICD-10-CM | POA: Insufficient documentation

## 2022-10-24 DIAGNOSIS — I2699 Other pulmonary embolism without acute cor pulmonale: Secondary | ICD-10-CM | POA: Insufficient documentation

## 2022-10-24 DIAGNOSIS — C9000 Multiple myeloma not having achieved remission: Secondary | ICD-10-CM | POA: Insufficient documentation

## 2022-10-24 DIAGNOSIS — Z1231 Encounter for screening mammogram for malignant neoplasm of breast: Secondary | ICD-10-CM | POA: Diagnosis not present

## 2022-10-24 DIAGNOSIS — E782 Mixed hyperlipidemia: Secondary | ICD-10-CM

## 2022-10-24 DIAGNOSIS — G4733 Obstructive sleep apnea (adult) (pediatric): Secondary | ICD-10-CM | POA: Insufficient documentation

## 2022-10-24 DIAGNOSIS — E7849 Other hyperlipidemia: Secondary | ICD-10-CM

## 2022-10-24 DIAGNOSIS — Z780 Asymptomatic menopausal state: Secondary | ICD-10-CM | POA: Insufficient documentation

## 2022-10-24 DIAGNOSIS — R202 Paresthesia of skin: Secondary | ICD-10-CM | POA: Insufficient documentation

## 2022-10-24 DIAGNOSIS — N8110 Cystocele, unspecified: Secondary | ICD-10-CM | POA: Insufficient documentation

## 2022-10-24 MED ORDER — LENALIDOMIDE 10 MG PO CAPS: ORAL_CAPSULE | ORAL | 0 refills | Status: DC

## 2022-10-24 NOTE — Assessment & Plan Note (Signed)
Stable  on myrbetriq 25 mg daily

## 2022-10-24 NOTE — Assessment & Plan Note (Signed)
Controlled She takes amlodipine 10 mg daily losartan 100 mg daily and metoprolol 25 mg twice daily Asymptomatic today in the clinic Low-sodium diet with increased physical activity encouraged BP Readings from Last 3 Encounters:  10/24/22 132/80  10/12/22 133/67  09/30/22 113/72

## 2022-10-24 NOTE — Assessment & Plan Note (Addendum)
She is taking simvastatin 40 mg daily Reports compliance with treatment regimen No complaints or concerns voiced

## 2022-10-24 NOTE — Telephone Encounter (Signed)
Chart reviewed. Revlimid refilled per last office note with Dr. Katragadda.  

## 2022-10-24 NOTE — Patient Instructions (Signed)
I appreciate the opportunity to provide care to you today!    Follow up:  4 months  Labs: next visit  Kindly follow up with cardiology regarding refills on warfarin.    Please continue to a heart-healthy diet and increase your physical activities. Try to exercise for at least five days a week.      It was a pleasure to see you and I look forward to continuing to work together on your health and well-being. Please do not hesitate to call the office if you need care or have questions about your care.   Have a wonderful day and week. With Gratitude, Gilmore Laroche MSN, FNP-BC

## 2022-10-24 NOTE — Progress Notes (Signed)
Established Patient Office Visit  Subjective:  Patient ID: Courtney Rogers, female    DOB: December 28, 1950  Age: 72 y.o. MRN: [id number]  CC:  Chief Complaint  Patient presents with   Chronic Care Management    4 month f/u    HPI Courtney Rogers is a 72 y.o. female with past medical history of hypertension, myeloma and hyperlipidemia presents for f/u of  chronic medical conditions. For the details of today's visit, please refer to the assessment and plan.     Past Medical History:  Diagnosis Date   Acute myocardial infarction [facility]) 2009   CAD/no stent medically managed   Anxiety disorder    CAD (coronary artery disease)    Cancer (HCC)    multiple myeloma   Hyperlipidemia    Hypertension    IBS (irritable bowel syndrome)    Non-ulcer dyspepsia 04/30/2009   Qualifier: Diagnosis of  By: Darrick Penna MD, Sandi L    Overactive bladder    PE (pulmonary embolism) 10/2012   left lung   Presence of permanent cardiac pacemaker    Sleep apnea     Past Surgical History:  Procedure Laterality Date   ABDOMINAL HYSTERECTOMY     BSO secondary to cyst     CARDIAC CATHETERIZATION     COLONOSCOPY  12/2009   Dr. Aleene Davidson, propofol, normal. Next TCS 12/2019   COLONOSCOPY N/A 05/22/2017   Examined portion ileum was normal, significant looping of colon, external hemorrhoids and rectal bleeding due to internal hemorrhoids, mild diverticulosis procedure: COLONOSCOPY;  Surgeon: West Bali, MD;  Location: [facility];  Service: Endoscopy;  Laterality: N/A;  10:30am   ESOPHAGOGASTRODUODENOSCOPY  05/11/2009   schatzki ring/small hiatal hernia/path:gastritis   ESOPHAGOGASTRODUODENOSCOPY N/A 05/22/2017   Multiple gastric polyps with biopsy benign fundic gland polyp, small bowel biopsy negative for celiac, gastric biopsy with mild gastritis but no H. pylori procedure: ESOPHAGOGASTRODUODENOSCOPY (EGD);  Surgeon: West Bali, MD;  Location: [facility];  Service: Endoscopy;  Laterality: N/A;    ESOPHAGOGASTRODUODENOSCOPY (EGD) WITH ESOPHAGEAL DILATION N/A 04/01/2013   YQM:VHQIONGEX at the gastroesophageal juction/multiple small polyps/mild gastritis   GIVENS CAPSULE STUDY N/A 06/07/2017   Frequent gastric erosions, normal small bowel mucosa.  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    Family History  Problem Relation Age of Onset   Colon polyps Neg Hx    Colon cancer Neg Hx     Social History   Socioeconomic History   Marital status: Married    Spouse name: Doctor, general practice   Number of children: 3   Years of education: 16   Highest education level: Not on file  Occupational History   Not on file  Tobacco Use   Smoking status: Never    Passive exposure: Never   Smokeless tobacco: Never   Tobacco comments:    Never smoked   Vaping Use   Vaping Use: Never used  Substance and Sexual Activity   Alcohol use: Not Currently    Comment: occ wine   Drug use: No   Sexual activity: Not Currently  Other Topics Concern   Not on file  Social History Narrative      Lives with husband-51 years 952 in Aug 2021   Daughter is close  by, 2 sons live further away   One IN [place],ONE IN [place], ONE BESIDE HER.        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   Enjoys: reading, young adult      Diet: eats all food groups    Caffeine: coffee 1, tea daily soda-1 daily   Water: 1-2 cups      Wears seat belt    Does not use phone while driving   Smoke detectors at home    Licensed conveyancer -locked up      Left handed   One story home   Drinks caffeine   Social Determinants of Health   Financial Resource Strain: Low Risk  (11/15/2021)   Overall Financial Resource Strain (CARDIA)    Difficulty of Paying Living Expenses: Not hard at all  Food  Insecurity: No Food Insecurity (11/15/2021)   Hunger Vital Sign    Worried About Running Out of Food in the Last Year: Never true    Ran Out of Food in the Last Year: Never true  Transportation Needs: No Transportation Needs (11/15/2021)   PRAPARE - Administrator, Civil Service (Medical): No    Lack of Transportation (Non-Medical): No  Physical Activity: Inactive (11/26/2021)   Exercise Vital Sign    Days of Exercise per Week: 0 days    Minutes of Exercise per Session: 0 min  Stress: Stress Concern Present (11/26/2021)   [employer] of Occupational Health - Occupational Stress Questionnaire    Feeling of Stress : To some extent  Social Connections: Moderately Integrated (11/11/2020)   Social Connection and Isolation Panel [NHANES]    Frequency of Communication with Friends and Family: Once a week    Frequency of Social Gatherings with Friends and Family: Once a week    Attends Religious Services: More than 4 times per year    Active Member of [employer] or Organizations: Yes    Attends Banker Meetings: More than 4 times per year    Marital Status: Married  Catering manager Violence: Not At Risk (11/11/2020)   Humiliation, Afraid, Rape, and Kick questionnaire    Fear of Current or Ex-Partner: No    Emotionally Abused: No    Physically Abused: No    Sexually Abused: No    Outpatient Medications Prior to Visit  Medication Sig Dispense Refill   acetaminophen (TYLENOL) 500 MG tablet Take 1,000 mg by mouth 2 (two) times daily as needed for moderate pain or headache.     acyclovir (ZOVIRAX) 400 MG tablet TAKE 1 TABLET BY MOUTH TWICE DAILY 60 tablet 6   albuterol (VENTOLIN HFA) 108 (90 Base) MCG/ACT inhaler USE 2 PUFFS ONE TO TWO TIMES A DAY. 18 g 5   amLODipine (NORVASC) 10 MG tablet TAKE ONE TABLET BY MOUTH ONCE DAILY 90 tablet 0   ascorbic acid (VITAMIN C) 500 MG tablet Take 500 mg by mouth daily.     aspirin EC 81 MG tablet Take 81 mg by mouth at bedtime.     B  Complex Vitamins (VITAMIN B COMPLEX) TABS Take 1 tablet by mouth daily.     B Complex-Folic Acid (B COMPLEX VITAMINS, W/ FA,) CAPS Take 1 capsule by mouth daily.     bortezomib SQ (VELCADE) 3.5 MG injection Inject into the skin once.     busPIRone (BUSPAR) 5 MG tablet Take 1 tablet (5 mg total) by mouth daily. 30 tablet 3   Calcium Carbonate-Vitamin D (CALCIUM  600+D PO) Take 1 tablet by mouth 2 (two) times daily.     chlorthalidone (HYGROTON) 25 MG tablet Take 25 mg by mouth daily.     cholecalciferol (VITAMIN D3) 10 MCG (400 UNIT) TABS tablet Take 400 Units by mouth daily.     cholecalciferol 25 MCG (1000 UT) tablet Take by mouth.     cyanocobalamin (VITAMIN B12) 1000 MCG tablet Take by mouth.     cyclobenzaprine (FLEXERIL) 10 MG tablet TAKE ONE-HALF TABLET BY MOUTH TWICE DAILY AS NEEDED FOR MUSCLE SPASMS 20 tablet 0   Daratumumab-Hyaluronidase-fihj (DARZALEX FASPRO [facility]) Inject into the skin.     diclofenac Sodium (VOLTAREN) 1 % GEL Apply 4 g topically 4 (four) times daily. 50 g 0   doxycycline (VIBRAMYCIN) 100 MG capsule Take 100 mg by mouth daily.     fluticasone (FLONASE) 50 MCG/ACT nasal spray Place 2 sprays into both nostrils daily. 16 g 0   fluticasone-salmeterol (ADVAIR) 250-50 MCG/ACT AEPB Inhale into the lungs.     folic acid (FOLVITE) 1 MG tablet TAKE 1 TABLET BY MOUTH ONCE DAILY. 30 tablet 11   gabapentin (NEURONTIN) 100 MG capsule Take 100 mg by mouth 3 (three) times daily.     gabapentin (NEURONTIN) 600 MG tablet Take by mouth.     hydrochlorothiazide (MICROZIDE) 12.5 MG capsule Take 12.5 mg by mouth daily.     HYDROcodone-acetaminophen (NORCO/VICODIN) 5-325 MG tablet TAKE ONE TABLET BY MOUTH EVERY 8 HOURS AS NEEDED FOR MODERATE PAIN 60 tablet 0   Hyoscyamine Sulfate SL 0.125 MG SUBL      lenalidomide (REVLIMID) 10 MG capsule Take 1 capsule (10 mg total) by mouth daily. 21 days on, 7 days off every 28 days 21 capsule 0   levocetirizine (XYZAL) 5 MG tablet TAKE ONE TABLET BY MOUTH  IN THE EVENING 30 tablet 1   lidocaine (HM LIDOCAINE PATCH) 4 % Place 1 patch onto the skin daily. 14 patch 0   lidocaine (XYLOCAINE) 2 % solution Take 2 teaspoons before meals and at bedtime as needed. May repeat every 4 hours for a maximum dose of 8 per day. 300 mL 0   Lidocaine HCl (XOLIDO) 2 % CREA Apply 1 application topically daily as needed (arthritisis).     loperamide (IMODIUM) 2 MG capsule Take 4 mg by mouth as needed. Take 2 tablets after the first loose stool and then 1 tablet after each loose stool.  Do not exceed 8 capsules within 24 hours.     losartan (COZAAR) 100 MG tablet      magnesium oxide (MAG-OX) 400 (240 Mg) MG tablet Take 1 tablet (400 mg total) by mouth 2 (two) times daily. 60 tablet 4   metoprolol succinate (TOPROL-XL) 50 MG 24 hr tablet      metoprolol tartrate (LOPRESSOR) 50 MG tablet Take 1 tablet (50 mg total) by mouth 2 (two) times daily. (Patient taking differently: Take 25 mg by mouth 2 (two) times daily.) 180 tablet 1   mirtazapine (REMERON) 15 MG tablet Take 15 mg by mouth at bedtime.     montelukast (SINGULAIR) 10 MG tablet Take 10 mg by mouth daily.      montelukast (SINGULAIR) 10 MG tablet Take 1 tablet (10 mg total) by mouth at bedtime. 30 tablet 3   MYRBETRIQ 25 MG TB24 tablet TAKE ONE TABLET BY MOUTH ONCE DAILY 30 tablet 0   nitroGLYCERIN (NITROSTAT) 0.4 MG SL tablet Place 0.4 mg under the tongue every 5 (five) minutes as needed for chest  pain.     pantoprazole (PROTONIX) 40 MG tablet Take 40 mg by mouth daily.     potassium chloride (KLOR-CON M) 10 MEQ tablet      potassium chloride SA (KLOR-CON M) 20 MEQ tablet Take 1 tablet (20 mEq total) by mouth 3 (three) times daily. 21 tablet 0   primidone (MYSOLINE) 50 MG tablet Take 75 mg by mouth daily.     primidone (MYSOLINE) 50 MG tablet Take by mouth.     Probiotic Product (GNP PROBIOTIC COLON SUPPORT) CAPS Take 1 capsule by mouth daily.     sertraline (ZOLOFT) 100 MG tablet Take 200 mg by mouth daily.       simvastatin (ZOCOR) 40 MG tablet TAKE ONE TABLET BY MOUTH AT BEDTIME 90 tablet 1   tadalafil, PAH, (ALYQ) 20 MG tablet Take 2 tablets every day by oral route.     traMADol (ULTRAM) 50 MG tablet Take 50 mg by mouth 2 (two) times daily.     trimethoprim-polymyxin b (POLYTRIM) ophthalmic solution INSTILL 1 DROP INTO AFFECTED EYE(S) BY OPHTHALMIC ROUTE EVERY 6 HOURS FOR 7 DAYS     warfarin (COUMADIN) 3 MG tablet Take 3 mg by mouth daily.     No facility-administered medications prior to visit.    Allergies  Allergen Reactions   Amoxicillin-Pot Clavulanate Other (See Comments)   Clarithromycin Other (See Comments)    Stomach problems   Erythromycin    Lisinopril Swelling   Amoxicillin Diarrhea    ROS Review of Systems  Constitutional:  Negative for chills and fever.  Eyes:  Negative for visual disturbance.  Respiratory:  Negative for chest tightness and shortness of breath.   Neurological:  Negative for dizziness and headaches.      Objective:    Physical Exam HENT:     Head: Normocephalic.     Mouth/Throat:     Mouth: Mucous membranes are moist.  Cardiovascular:     Rate and Rhythm: Normal rate.     Heart sounds: Normal heart sounds.  Pulmonary:     Effort: Pulmonary effort is normal.     Breath sounds: Normal breath sounds.  Neurological:     Mental Status: She is alert.     BP 132/80   Pulse 88   Ht 5\' 4"  (1.626 m)   Wt 160 lb 1.9 oz (72.6 kg)   SpO2 97%   BMI 27.48 kg/m  Wt Readings from Last 3 Encounters:  10/24/22 160 lb 1.9 oz (72.6 kg)  10/12/22 161 lb (73 kg)  09/30/22 162 lb 1.3 oz (73.5 kg)    Lab Results  Component Value Date   TSH 1.180 06/27/2022   Lab Results  Component Value Date   WBC 3.2 (L) 10/12/2022   HGB 11.8 (L) 10/12/2022   HCT 35.1 (L) 10/12/2022   MCV 103.8 (H) 10/12/2022   PLT 94 (L) 10/12/2022   Lab Results  Component Value Date   NA 140 10/12/2022   K 3.6 10/12/2022   CO2 28 10/12/2022   GLUCOSE 98 10/12/2022   BUN  18 10/12/2022   CREATININE 0.82 10/12/2022   BILITOT 0.5 10/12/2022   ALKPHOS 45 10/12/2022   AST 21 10/12/2022   ALT 22 10/12/2022   PROT 6.4 (L) 10/12/2022   ALBUMIN 4.1 10/12/2022   CALCIUM 8.8 (L) 10/12/2022   ANIONGAP 9 10/12/2022   EGFR 58 (L) 06/04/2021   Lab Results  Component Value Date   CHOL 146 06/27/2022   Lab Results  Component  Value Date   HDL 65 06/27/2022   Lab Results  Component Value Date   LDLCALC 62 06/27/2022   Lab Results  Component Value Date   TRIG 102 06/27/2022   Lab Results  Component Value Date   CHOLHDL 2.2 06/27/2022   Lab Results  Component Value Date   HGBA1C 5.2 06/27/2022      Assessment & Plan:  Essential hypertension Assessment & Plan: Controlled She takes amlodipine 10 mg daily losartan 100 mg daily and metoprolol 25 mg twice daily Asymptomatic today in the clinic Low-sodium diet with increased physical activity encouraged BP Readings from Last 3 Encounters:  10/24/22 132/80  10/12/22 133/67  09/30/22 113/72      Mixed hyperlipidemia Assessment & Plan: She is taking simvastatin 40 mg daily Reports compliance with treatment regimen No complaints or concerns voiced   Mixed stress and urge urinary incontinence Assessment & Plan: Stable  on myrbetriq 25 mg daily   Breast cancer screening by mammogram    Follow-up: Return in about 4 months (around 02/23/2023).   Gilmore Laroche, FNP

## 2022-10-25 ENCOUNTER — Other Ambulatory Visit: Payer: Self-pay

## 2022-10-25 DIAGNOSIS — C9000 Multiple myeloma not having achieved remission: Secondary | ICD-10-CM

## 2022-10-25 NOTE — Progress Notes (Signed)
[facility] [address] [place] [address]    Clinic Day:  10/26/2022  Referring physician: Doreatha Massed, MD  Patient Care Team: Gilmore Laroche, FNP as PCP - General (Family Medicine) Doreatha Massed, MD as Medical Oncologist (Oncology) Mickie Bail, RN as Oncology Nurse Navigator (Oncology) Lanelle Bal, DO as Consulting Physician (Internal Medicine) Randa Spike, Kelton Pillar, LCSW as [facility] Care Management (Licensed Clinical Social Worker)   ASSESSMENT & PLAN:   Assessment: 1.  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  Increased number of atypical plasma cells present 36% of all cell lines.  Plasma cells are kappa light chain restricted. -Unfortunately her specimen has reached cytogenetics and FISH lab week later due to bad weather and no viable plasma cells. -PET scan on 07/09/2019 showed no findings of active myeloma. -24-hour urine shows total protein 59 mg.  Urine immunofixation shows IgA kappa monoclonal protein.  LDH is 184. -Based on [facility] criteria she has high risk with about 45-50% probability of progression to myeloma in the next 2 years. -CTAP on 01/07/2020 shows acute superior endplate burst fracture of L1.  Mild associated paravertebral soft tissue thickening.  No other bone abnormalities. -Bone density test on 02/07/2020 shows T score -0.5. - Bone marrow biopsy on 07/12/2021: Hypercellular with increased number of atypical plasma cells representing 30% of cells, displaying kappa light chain restriction. - FISH panel: Gain of 1 q. (high risk), T p53 deletion (high risk), monosomy 13 (standard risk) - Cytogenetics: Complex karyotype. - PET scan (06/24/2021): New hypermetabolic bone lesions involving left anterior iliac crest, left mid humerus, right superior pubic ramus, bilateral mid femurs, left calvarium.  Largest lytic  lesion in the left iliac crest measuring 2.9 x 2.3 cm. - 2D echo on 07/29/2021: EF 50-55%.  There is distal septal and inferior apical hypokinesis.  (Carfilzomib based regimen was put on hold due to echo findings) - Dara VRD 6 cycles from 08/10/2021 through 11/30/2021 - Myeloma panel 12/07/2021, DIRA test was negative, M spike 0.2 g and light chain ratio normal. - Maintenance Velcade every 2 weeks and Revlimid 3 weeks on 1 week off Started on 12/20/2021    2.  Social/family history: - She uses walker to ambulate since her right knee replacement leading to stiffness.  She lives with older sister and husband who has dementia. - She is independent of ADLs and IADLs.  She also drives     Plan: 1.  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 - We reviewed myeloma labs from 10/12/2022: SPEP is undetected.  Immunofixation normal.  Free light chain ratio is normal. - Labs today: Normal LFTs and creatinine.  CBC shows mild leukopenia and thrombocytopenia of 81,000. - Continue Revlimid 10 mg 3 weeks on/1 week off.  Continue Velcade 1 mg/m every 2 weeks as maintenance. - She was reportedly seen by neurologist Dr. Lacey Jensen in [place] as she had difficulty picking up stuff with her hands and is dropping small things like coins.  She is being worked up for carpal tunnel syndrome per patient.  We will obtain records. - RTC 8 weeks for follow-up.   2.  Electrolyte abnormalities: - Continue potassium and magnesium supplements.  They are normal today.   3.  Pulmonary embolism (2015): - Continue Coumadin indefinitely.  No bleeding issues.  4.  ID prophylaxis: - Continue acyclovir twice daily.  5.  Myeloma bone disease: - Her last dose of denosumab was in January.  We are holding it due to dental work.  We will start back after dental work completed.  6.  Right knee pain: - Continue hydrocodone 5/325 as needed.    Orders  Placed This Encounter  Procedures   CBC with Differential    Standing Status:   Future    Standing Expiration Date:   10/26/2023   Comprehensive metabolic panel    Standing Status:   Future    Standing Expiration Date:   10/26/2023   Magnesium    Standing Status:   Future    Standing Expiration Date:   10/26/2023   Kappa/lambda light chains    Standing Status:   Future    Standing Expiration Date:   10/26/2023   Immunofixation electrophoresis    Standing Status:   Future    Standing Expiration Date:   10/26/2023   Protein electrophoresis, serum    Standing Status:   Future    Standing Expiration Date:   10/26/2023      I,Katie Daubenspeck,acting as a scribe for Doreatha Massed, MD.,have documented all relevant documentation on the behalf of Doreatha Massed, MD,as directed by  Doreatha Massed, MD while in the presence of Doreatha Massed, MD.   I, Doreatha Massed MD, have reviewed the above documentation for accuracy and completeness, and I agree with the above.   Doreatha Massed, MD   6/5/20246:18 PM  CHIEF COMPLAINT:   Diagnosis: multiple myeloma    Cancer Staging  No matching staging information was found for the patient.   Prior Therapy: Dara VRD 6 cycles completed on 11/30/2021   Current Therapy:  Maintenance Velcade every 2 weeks, and Revlimid 3 weeks on/1 week off    HISTORY OF PRESENT ILLNESS:   Oncology History  IgA myeloma (HCC)  05/06/2021 Initial Diagnosis   IgA myeloma (HCC)   08/10/2021 - 01/19/2022 Chemotherapy   Patient is on Treatment Plan : MYELOMA NEWLY DIAGNOSED TRANSPLANT CANDIDATE DaraVRd (Daratumumab SQ) q21d x 6 Cycles (Induction/Consolidation)     08/10/2021 -  Chemotherapy   Patient is on Treatment Plan : MYELOMA NEWLY DIAGNOSED TRANSPLANT CANDIDATE DaraVRd (Daratumumab SQ) q21d x 6 Cycles (Induction/Consolidation)        INTERVAL HISTORY:   Ramyiah is a 72 y.o. female presenting to clinic today for follow up of multiple  myeloma. She was last seen by me on 08/31/22.  Today, she states that she is doing well overall. Her appetite level is at 80%. Her energy level is at 50%.  PAST MEDICAL HISTORY:   Past Medical History: Past Medical History:  Diagnosis Date   Acute myocardial infarction [facility]) 2009   CAD/no stent medically managed   Anxiety disorder    CAD (coronary artery disease)    Cancer (HCC)    multiple myeloma   Hyperlipidemia    Hypertension    IBS (irritable bowel syndrome)    Non-ulcer dyspepsia 04/30/2009   Qualifier: Diagnosis of  By: Darrick Penna MD, Sandi L    Overactive bladder    PE (pulmonary embolism) 10/2012   left lung   Presence of permanent cardiac pacemaker    Sleep apnea     Surgical History: Past Surgical History:  Procedure Laterality Date   ABDOMINAL HYSTERECTOMY     BSO secondary to cyst     CARDIAC CATHETERIZATION     COLONOSCOPY  12/2009   Dr.  Spainhour, propofol, normal. Next TCS 12/2019   COLONOSCOPY N/A 05/22/2017   Examined portion ileum was normal, significant looping of colon, external hemorrhoids and rectal bleeding due to internal hemorrhoids, mild diverticulosis procedure: COLONOSCOPY;  Surgeon: West Bali, MD;  [facility];  Service: Endoscopy;  Laterality: N/A;  10:30am   ESOPHAGOGASTRODUODENOSCOPY  05/11/2009   schatzki ring/small hiatal hernia/path:gastritis   ESOPHAGOGASTRODUODENOSCOPY N/A 05/22/2017   Multiple gastric polyps with biopsy benign fundic gland polyp, small bowel biopsy negative for celiac, gastric biopsy with mild gastritis but no H. pylori procedure: ESOPHAGOGASTRODUODENOSCOPY (EGD);  Surgeon: West Bali, MD;  [facility]: [facility];  Service: Endoscopy;  Laterality: N/A;   ESOPHAGOGASTRODUODENOSCOPY (EGD) WITH ESOPHAGEAL DILATION N/A 04/01/2013   [id number] at the gastroesophageal juction/multiple small polyps/mild gastritis   GIVENS CAPSULE STUDY N/A 06/07/2017   Frequent gastric erosions, normal small bowel  mucosa.  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    Social History: Social History   Socioeconomic History   Marital status: Married    Spouse name: Doctor, general practice   Number of children: 3   Years of education: 16   Highest education level: Not on file  Occupational History   Not on file  Tobacco Use   Smoking status: Never    Passive exposure: Never   Smokeless tobacco: Never   Tobacco comments:    Never smoked   Vaping Use   Vaping Use: Never used  Substance and Sexual Activity   Alcohol use: Not Currently    Comment: occ wine   Drug use: No   Sexual activity: Not Currently  Other Topics Concern   Not on file  Social History Narrative      Lives with husband-51 years 950 in Aug 2021   Daughter is close by, 2 sons live further away   One IN [place],ONE IN [place], ONE BESIDE HER.        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   Enjoys: reading, young adult      Diet: eats all food groups    Caffeine: coffee 1, tea daily soda-1 daily   Water: 1-2 cups      Wears seat belt    Does not use phone while driving   Smoke detectors at home    Licensed conveyancer -locked up      Left handed   One story home   Drinks caffeine   Social Determinants of Health   Financial Resource Strain: Low Risk  (11/15/2021)   Overall Financial Resource Strain (CARDIA)    Difficulty of Paying Living Expenses: Not hard at all  Food Insecurity: No Food Insecurity (11/15/2021)   Hunger Vital Sign    Worried About Running Out of Food in the Last Year: Never true    Ran Out of Food in the Last Year: Never true  Transportation Needs: No Transportation Needs (11/15/2021)   PRAPARE - Administrator, Civil Service (Medical): No    Lack of Transportation  (Non-Medical): No  Physical Activity: Inactive (11/26/2021)   Exercise Vital Sign    Days of Exercise per Week: 0 days    Minutes of Exercise per Session: 0 min  Stress: Stress Concern Present (11/26/2021)   [place]  Institute of Occupational Health - Occupational Stress Questionnaire    Feeling of Stress : To some extent  Social Connections: Moderately Integrated (11/11/2020)   Social Connection and Isolation Panel [NHANES]    Frequency of Communication with Friends and Family: Once a week    Frequency of Social Gatherings with Friends and Family: Once a week    Attends Religious Services: More than 4 times per year    Active Member of [employer] or Organizations: Yes    Attends Engineer, structural: More than 4 times per year    Marital Status: Married  Catering manager Violence: Not At Risk (11/11/2020)   Humiliation, Afraid, Rape, and Kick questionnaire    Fear of Current or Ex-Partner: No    Emotionally Abused: No    Physically Abused: No    Sexually Abused: No    Family History: Family History  Problem Relation Age of Onset   Colon polyps Neg Hx    Colon cancer Neg Hx     Current Medications:  Current Outpatient Medications:    acetaminophen (TYLENOL) 500 MG tablet, Take 1,000 mg by mouth 2 (two) times daily as needed for moderate pain or headache., Disp: , Rfl:    acyclovir (ZOVIRAX) 400 MG tablet, TAKE 1 TABLET BY MOUTH TWICE DAILY, Disp: 60 tablet, Rfl: 6   albuterol (VENTOLIN HFA) 108 (90 Base) MCG/ACT inhaler, USE 2 PUFFS ONE TO TWO TIMES A DAY., Disp: 18 g, Rfl: 5   amLODipine (NORVASC) 10 MG tablet, TAKE ONE TABLET BY MOUTH ONCE DAILY, Disp: 90 tablet, Rfl: 0   ascorbic acid (VITAMIN C) 500 MG tablet, Take 500 mg by mouth daily., Disp: , Rfl:    aspirin EC 81 MG tablet, Take 81 mg by mouth at bedtime., Disp: , Rfl:    B Complex Vitamins (VITAMIN B COMPLEX) TABS, Take 1 tablet by mouth daily., Disp: , Rfl:    B Complex-Folic Acid (B COMPLEX VITAMINS, W/ FA,) CAPS,  Take 1 capsule by mouth daily., Disp: , Rfl:    bortezomib SQ (VELCADE) 3.5 MG injection, Inject into the skin once., Disp: , Rfl:    busPIRone (BUSPAR) 5 MG tablet, Take 1 tablet (5 mg total) by mouth daily., Disp: 30 tablet, Rfl: 3   Calcium Carbonate-Vitamin D (CALCIUM 600+D PO), Take 1 tablet by mouth 2 (two) times daily., Disp: , Rfl:    chlorthalidone (HYGROTON) 25 MG tablet, Take 25 mg by mouth daily., Disp: , Rfl:    cholecalciferol (VITAMIN D3) 10 MCG (400 UNIT) TABS tablet, Take 400 Units by mouth daily., Disp: , Rfl:    cholecalciferol 25 MCG (1000 UT) tablet, Take by mouth., Disp: , Rfl:    cyanocobalamin (VITAMIN B12) 1000 MCG tablet, Take by mouth., Disp: , Rfl:    cyclobenzaprine (FLEXERIL) 10 MG tablet, TAKE ONE-HALF TABLET BY MOUTH TWICE DAILY AS NEEDED FOR MUSCLE SPASMS, Disp: 20 tablet, Rfl: 0   Daratumumab-Hyaluronidase-fihj (DARZALEX FASPRO [place]), Inject into the skin., Disp: , Rfl:    diclofenac Sodium (VOLTAREN) 1 % GEL, Apply 4 g topically 4 (four) times daily., Disp: 50 g, Rfl: 0   doxycycline (VIBRAMYCIN) 100 MG capsule, Take 100 mg by mouth daily., Disp: , Rfl:    fluticasone (FLONASE) 50 MCG/ACT nasal spray, Place 2 sprays into both nostrils daily., Disp: 16 g, Rfl: 0   fluticasone-salmeterol (ADVAIR) 250-50 MCG/ACT AEPB, Inhale into the lungs., Disp: , Rfl:    folic acid (FOLVITE) 1 MG tablet, TAKE 1 TABLET BY MOUTH ONCE  DAILY., Disp: 30 tablet, Rfl: 11   gabapentin (NEURONTIN) 100 MG capsule, Take 100 mg by mouth 3 (three) times daily., Disp: , Rfl:    gabapentin (NEURONTIN) 600 MG tablet, Take by mouth., Disp: , Rfl:    hydrochlorothiazide (MICROZIDE) 12.5 MG capsule, Take 12.5 mg by mouth daily., Disp: , Rfl:    HYDROcodone-acetaminophen (NORCO/VICODIN) 5-325 MG tablet, TAKE ONE TABLET BY MOUTH EVERY 8 HOURS AS NEEDED FOR MODERATE PAIN, Disp: 60 tablet, Rfl: 0   Hyoscyamine Sulfate SL 0.125 MG SUBL, , Disp: , Rfl:    lenalidomide (REVLIMID) 10 MG capsule, Take 1  capsule (10 mg total) by mouth daily. 21 days on, 7 days off every 28 days, Disp: 21 capsule, Rfl: 0   levocetirizine (XYZAL) 5 MG tablet, TAKE ONE TABLET BY MOUTH IN THE EVENING, Disp: 30 tablet, Rfl: 1   lidocaine (HM LIDOCAINE PATCH) 4 %, Place 1 patch onto the skin daily., Disp: 14 patch, Rfl: 0   lidocaine (XYLOCAINE) 2 % solution, Take 2 teaspoons before meals and at bedtime as needed. May repeat every 4 hours for a maximum dose of 8 per day., Disp: 300 mL, Rfl: 0   Lidocaine HCl (XOLIDO) 2 % CREA, Apply 1 application topically daily as needed (arthritisis)., Disp: , Rfl:    loperamide (IMODIUM) 2 MG capsule, Take 4 mg by mouth as needed. Take 2 tablets after the first loose stool and then 1 tablet after each loose stool.  Do not exceed 8 capsules within 24 hours., Disp: , Rfl:    losartan (COZAAR) 100 MG tablet, , Disp: , Rfl:    magnesium oxide (MAG-OX) 400 (240 Mg) MG tablet, Take 1 tablet (400 mg total) by mouth 2 (two) times daily., Disp: 60 tablet, Rfl: 4   metoprolol succinate (TOPROL-XL) 50 MG 24 hr tablet, , Disp: , Rfl:    metoprolol tartrate (LOPRESSOR) 50 MG tablet, Take 1 tablet (50 mg total) by mouth 2 (two) times daily. (Patient taking differently: Take 25 mg by mouth 2 (two) times daily.), Disp: 180 tablet, Rfl: 1   mirtazapine (REMERON) 15 MG tablet, Take 15 mg by mouth at bedtime., Disp: , Rfl:    montelukast (SINGULAIR) 10 MG tablet, Take 10 mg by mouth daily. , Disp: , Rfl:    montelukast (SINGULAIR) 10 MG tablet, Take 1 tablet (10 mg total) by mouth at bedtime., Disp: 30 tablet, Rfl: 3   MYRBETRIQ 25 MG TB24 tablet, TAKE ONE TABLET BY MOUTH ONCE DAILY, Disp: 30 tablet, Rfl: 0   nitroGLYCERIN (NITROSTAT) 0.4 MG SL tablet, Place 0.4 mg under the tongue every 5 (five) minutes as needed for chest pain., Disp: , Rfl:    pantoprazole (PROTONIX) 40 MG tablet, Take 40 mg by mouth daily., Disp: , Rfl:    potassium chloride (KLOR-CON M) 10 MEQ tablet, , Disp: , Rfl:    potassium  chloride SA (KLOR-CON M) 20 MEQ tablet, Take 1 tablet (20 mEq total) by mouth 3 (three) times daily., Disp: 21 tablet, Rfl: 0   primidone (MYSOLINE) 50 MG tablet, Take 75 mg by mouth daily., Disp: , Rfl:    primidone (MYSOLINE) 50 MG tablet, Take by mouth., Disp: , Rfl:    Probiotic Product (GNP PROBIOTIC COLON SUPPORT) CAPS, Take 1 capsule by mouth daily., Disp: , Rfl:    sertraline (ZOLOFT) 100 MG tablet, Take 200 mg by mouth daily. , Disp: , Rfl:    simvastatin (ZOCOR) 40 MG tablet, TAKE ONE TABLET BY MOUTH AT BEDTIME,  Disp: 90 tablet, Rfl: 1   tadalafil, PAH, (ALYQ) 20 MG tablet, Take 2 tablets every day by oral route., Disp: , Rfl:    traMADol (ULTRAM) 50 MG tablet, Take 50 mg by mouth 2 (two) times daily., Disp: , Rfl:    trimethoprim-polymyxin b (POLYTRIM) ophthalmic solution, INSTILL 1 DROP INTO AFFECTED EYE(S) BY OPHTHALMIC ROUTE EVERY 6 HOURS FOR 7 DAYS, Disp: , Rfl:    warfarin (COUMADIN) 3 MG tablet, Take 3 mg by mouth daily., Disp: , Rfl:    Allergies: Allergies  Allergen Reactions   Amoxicillin-Pot Clavulanate Other (See Comments)   Clarithromycin Other (See Comments)    Stomach problems   Erythromycin    Lisinopril Swelling   Amoxicillin Diarrhea    REVIEW OF SYSTEMS:   Review of Systems  Constitutional:  Negative for chills, fatigue and fever.  HENT:   Negative for lump/mass, mouth sores, nosebleeds, sore throat and trouble swallowing.   Eyes:  Negative for eye problems.  Respiratory:  Negative for cough and shortness of breath.   Cardiovascular:  Negative for chest pain, leg swelling and palpitations.  Gastrointestinal:  Positive for constipation and diarrhea. Negative for abdominal pain, nausea and vomiting.  Genitourinary:  Negative for bladder incontinence, difficulty urinating, dysuria, frequency, hematuria and nocturia.   Musculoskeletal:  Positive for back pain. Negative for arthralgias, flank pain, myalgias and neck pain.  Skin:  Negative for itching and rash.   Neurological:  Positive for numbness. Negative for dizziness and headaches.  Hematological:  Does not bruise/bleed easily.  Psychiatric/Behavioral:  Negative for depression, sleep disturbance and suicidal ideas. The patient is not nervous/anxious.   All other systems reviewed and are negative.    VITALS:   Blood pressure 128/79, pulse 88, temperature 98.2 F (36.8 C), temperature source Oral, resp. rate 18, height 5\' 4"  (1.626 m), weight 162 lb (73.5 kg), SpO2 99 %.  Wt Readings from Last 3 Encounters:  10/26/22 162 lb (73.5 kg)  10/24/22 160 lb 1.9 oz (72.6 kg)  10/12/22 161 lb (73 kg)    Body mass index is 27.81 kg/m.  Performance status (ECOG): 1 - Symptomatic but completely ambulatory  PHYSICAL EXAM:   Physical Exam Vitals and nursing note reviewed. Exam conducted with a chaperone present.  Constitutional:      Appearance: Normal appearance.  Cardiovascular:     Rate and Rhythm: Normal rate and regular rhythm.     Pulses: Normal pulses.     Heart sounds: Normal heart sounds.  Pulmonary:     Effort: Pulmonary effort is normal.     Breath sounds: Normal breath sounds.  Abdominal:     Palpations: Abdomen is soft. There is no hepatomegaly, splenomegaly or mass.     Tenderness: There is no abdominal tenderness.  Musculoskeletal:     Right lower leg: No edema.     Left lower leg: No edema.  Lymphadenopathy:     Cervical: No cervical adenopathy.     Right cervical: No superficial, deep or posterior cervical adenopathy.    Left cervical: No superficial, deep or posterior cervical adenopathy.     Upper Body:     Right upper body: No supraclavicular or axillary adenopathy.     Left upper body: No supraclavicular or axillary adenopathy.  Neurological:     General: No focal deficit present.     Mental Status: She is alert and oriented to person, place, and time.  Psychiatric:        Mood and Affect: Mood normal.  Behavior: Behavior normal.     LABS:       Latest Ref Rng & Units 10/26/2022   10:15 AM 10/12/2022   12:12 PM 09/28/2022   10:58 AM  CBC  WBC 4.0 - 10.5 K/uL 3.4  3.2  4.7   Hemoglobin 12.0 - 15.0 g/dL 16.1  09.6  04.5   Hematocrit 36.0 - 46.0 % 36.7  35.1  36.7   Platelets 150 - 400 K/uL 81  94  108       Latest Ref Rng & Units 10/26/2022   10:15 AM 10/12/2022   12:12 PM 09/28/2022   10:58 AM  CMP  Glucose 70 - 99 mg/dL 77  98  87   BUN 8 - 23 mg/dL 15  18  18    Creatinine 0.44 - 1.00 mg/dL 4.09  8.11  9.14   Sodium 135 - 145 mmol/L 140  140  140   Potassium 3.5 - 5.1 mmol/L 3.9  3.6  4.1   Chloride 98 - 111 mmol/L 102  103  104   CO2 22 - 32 mmol/L 30  28  28    Calcium 8.9 - 10.3 mg/dL 8.8  8.8  8.8   Total Protein 6.5 - 8.1 g/dL 6.5  6.4  6.5   Total Bilirubin 0.3 - 1.2 mg/dL 0.6  0.5  0.8   Alkaline Phos 38 - 126 U/L 51  45  50   AST 15 - 41 U/L 23  21  18    ALT 0 - 44 U/L 27  22  21       No results found for: "CEA1", "CEA" / No results found for: "CEA1", "CEA" No results found for: "PSA1" No results found for: "CAN199" No results found for: "CAN125"  Lab Results  Component Value Date   TOTALPROTELP 5.8 (L) 10/12/2022   TOTALPROTELP 6.1 10/12/2022   ALBUMINELP 3.8 10/12/2022   A1GS 0.2 10/12/2022   A2GS 0.6 10/12/2022   BETS 0.8 10/12/2022   GAMS 0.4 10/12/2022   MSPIKE Not Observed 10/12/2022   SPEI Comment 10/12/2022   Lab Results  Component Value Date   TIBC 291 06/09/2021   TIBC 297 01/21/2021   TIBC 302 10/21/2020   FERRITIN 279 06/09/2021   FERRITIN 216 01/21/2021   FERRITIN 274 10/21/2020   IRONPCTSAT 24 06/09/2021   IRONPCTSAT 19 01/21/2021   IRONPCTSAT 28 10/21/2020   Lab Results  Component Value Date   LDH 129 03/02/2022   LDH 121 12/14/2021   LDH 113 11/09/2021     STUDIES:   No results found.

## 2022-10-26 ENCOUNTER — Inpatient Hospital Stay: Payer: Medicare Other

## 2022-10-26 ENCOUNTER — Inpatient Hospital Stay: Payer: Medicare Other | Attending: Hematology

## 2022-10-26 ENCOUNTER — Inpatient Hospital Stay (HOSPITAL_BASED_OUTPATIENT_CLINIC_OR_DEPARTMENT_OTHER): Payer: Medicare Other | Admitting: Hematology

## 2022-10-26 DIAGNOSIS — Z5112 Encounter for antineoplastic immunotherapy: Secondary | ICD-10-CM | POA: Insufficient documentation

## 2022-10-26 DIAGNOSIS — C9000 Multiple myeloma not having achieved remission: Secondary | ICD-10-CM | POA: Insufficient documentation

## 2022-10-26 LAB — COMPREHENSIVE METABOLIC PANEL
ALT: 27 U/L (ref 0–44)
AST: 23 U/L (ref 15–41)
Albumin: 4.1 g/dL (ref 3.5–5.0)
Alkaline Phosphatase: 51 U/L (ref 38–126)
Anion gap: 8 (ref 5–15)
BUN: 15 mg/dL (ref 8–23)
CO2: 30 mmol/L (ref 22–32)
Calcium: 8.8 mg/dL — ABNORMAL LOW (ref 8.9–10.3)
Chloride: 102 mmol/L (ref 98–111)
Creatinine, Ser: 0.82 mg/dL (ref 0.44–1.00)
GFR, Estimated: >60 mL/min (ref >60)
Glucose, Bld: 77 mg/dL (ref 70–99)
Potassium: 3.9 mmol/L (ref 3.5–5.1)
Sodium: 140 mmol/L (ref 135–145)
Total Bilirubin: 0.6 mg/dL (ref 0.3–1.2)
Total Protein: 6.5 g/dL (ref 6.5–8.1)

## 2022-10-26 LAB — CBC WITH DIFFERENTIAL/PLATELET
Abs Immature Granulocytes: 0.01 10*3/uL (ref 0.00–0.07)
Basophils Absolute: 0 10*3/uL (ref 0.0–0.1)
Basophils Relative: 1 %
Eosinophils Absolute: 0.3 10*3/uL (ref 0.0–0.5)
Eosinophils Relative: 10 %
HCT: 36.7 % (ref 36.0–46.0)
Hemoglobin: 12.1 g/dL (ref 12.0–15.0)
Immature Granulocytes: 0 %
Lymphocytes Relative: 28 %
Lymphs Abs: 1 10*3/uL (ref 0.7–4.0)
MCH: 33.9 pg (ref 26.0–34.0)
MCHC: 33 g/dL (ref 30.0–36.0)
MCV: 102.8 fL — ABNORMAL HIGH (ref 80.0–100.0)
Monocytes Absolute: 0.4 10*3/uL (ref 0.1–1.0)
Monocytes Relative: 13 %
Neutro Abs: 1.7 10*3/uL (ref 1.7–7.7)
Neutrophils Relative %: 48 %
Platelets: 81 10*3/uL — ABNORMAL LOW (ref 150–400)
RBC: 3.57 MIL/uL — ABNORMAL LOW (ref 3.87–5.11)
RDW: 16.9 % — ABNORMAL HIGH (ref 11.5–15.5)
WBC: 3.4 10*3/uL — ABNORMAL LOW (ref 4.0–10.5)
nRBC: 0 % (ref 0.0–0.2)

## 2022-10-26 LAB — MAGNESIUM: Magnesium: 2 mg/dL (ref 1.7–2.4)

## 2022-10-26 MED ORDER — DEXAMETHASONE 4 MG PO TABS
20.0000 mg | ORAL_TABLET | Freq: Once | ORAL | Status: AC
  Administered 2022-10-26: 20 mg via ORAL
  Filled 2022-10-26: qty 5

## 2022-10-26 MED ORDER — PROCHLORPERAZINE MALEATE 10 MG PO TABS
10.0000 mg | ORAL_TABLET | Freq: Once | ORAL | Status: AC
  Administered 2022-10-26: 10 mg via ORAL
  Filled 2022-10-26: qty 1

## 2022-10-26 MED ORDER — BORTEZOMIB CHEMO SQ INJECTION 3.5 MG (2.5MG/ML)
1.0000 mg/m2 | Freq: Once | INTRAMUSCULAR | Status: AC
  Administered 2022-10-26: 1.75 mg via SUBCUTANEOUS
  Filled 2022-10-26: qty 0.7

## 2022-10-26 NOTE — Progress Notes (Signed)
Platelets 81 today.  Ok to treat verbal order Dr. Ellin Saba.   Patient tolerated Velcade injection with no complaints voiced.  Lab work reviewed.  See MAR for details.  Injection site clean and dry with no bruising or swelling noted.  Patient stable during and after injection.  Band aid applied.  VSS.  Patient left in satisfactory condition with no s/s of distress noted.

## 2022-10-26 NOTE — Patient Instructions (Signed)
[facility] at [facility] Discharge Instructions   You were seen and examined today by Dr. Katragadda.  He reviewed the results of your lab work which are normal/stable.   We will proceed with your treatment today.  Return as scheduled.    Thank you for choosing [facility] at [facility] to provide your oncology and hematology care.  To afford each patient quality time with our provider, please arrive at least 15 minutes before your scheduled appointment time.   If you have a lab appointment with the Cancer Center please come in thru the Main Entrance and check in at the main information desk.  You need to re-schedule your appointment should you arrive 10 or more minutes late.  We strive to give you quality time with our providers, and arriving late affects you and other patients whose appointments are after yours.  Also, if you no show three or more times for appointments you may be dismissed from the clinic at the providers discretion.     Again, thank you for choosing [facility].  Our hope is that these requests will decrease the amount of time that you wait before being seen by our physicians.       _____________________________________________________________  Should you have questions after your visit to [facility], please contact our office at [phone] and follow the prompts.  Our office hours are 8:00 a.m. and 4:30 p.m. Monday - Friday.  Please note that voicemails left after 4:00 p.m. may not be returned until the following business day.  We are closed weekends and major holidays.  You do have access to a nurse 24-7, just call the main number to the clinic [phone] and do not press any options, hold on the line and a nurse will answer the phone.    For prescription refill requests, have your pharmacy contact our office and allow 72 hours.    Due to Covid, you will need to wear a mask upon entering  the hospital. If you do not have a mask, a mask will be given to you at the Main Entrance upon arrival. For doctor visits, patients may have 1 support person age 18 or older with them. For treatment visits, patients can not have anyone with them due to social distancing guidelines and our immunocompromised population.      

## 2022-10-26 NOTE — Patient Instructions (Signed)
[facility]  Discharge Instructions: Thank you for choosing [facility] to provide your oncology and hematology care.  If you have a lab appointment with the Cancer Center - please note that after April 8th, 2024, all labs will be drawn in the cancer center.  You do not have to check in or register with the main entrance as you have in the past but will complete your check-in in the cancer center.  Wear comfortable clothing and clothing appropriate for easy access to any Portacath or PICC line.   We strive to give you quality time with your provider. You may need to reschedule your appointment if you arrive late (15 or more minutes).  Arriving late affects you and other patients whose appointments are after yours.  Also, if you miss three or more appointments without notifying the office, you may be dismissed from the clinic at the provider's discretion.      For prescription refill requests, have your pharmacy contact our office and allow 72 hours for refills to be completed.    Today you received the following chemotherapy and/or immunotherapy agents velcade.   Bortezomib Injection What is this medication? BORTEZOMIB (bor TEZ oh mib) treats lymphoma. It may also be used to treat multiple myeloma, a type of bone marrow cancer. It works by blocking a protein that causes cancer cells to grow and multiply. This helps to slow or stop the spread of cancer cells. This medicine may be used for other purposes; ask your health care provider or pharmacist if you have questions. COMMON BRAND NAME(S): Velcade What should I tell my care team before I take this medication? They need to know if you have any of these conditions: Dehydration Diabetes Heart disease Liver disease Tingling of the fingers or toes or other nerve disorder An unusual or allergic reaction to bortezomib, other medications, foods, dyes, or preservatives If you or your partner are pregnant or trying  to get pregnant Breastfeeding How should I use this medication? This medication is injected into a vein or under the skin. It is given by your care team in a hospital or clinic setting. Talk to your care team about the use of this medication in children. Special care may be needed. Overdosage: If you think you have taken too much of this medicine contact a poison control center or emergency room at once. NOTE: This medicine is only for you. Do not share this medicine with others. What if I miss a dose? Keep appointments for follow-up doses. It is important not to miss your dose. Call your care team if you are unable to keep an appointment. What may interact with this medication? Ketoconazole Rifampin This list may not describe all possible interactions. Give your health care provider a list of all the medicines, herbs, non-prescription drugs, or dietary supplements you use. Also tell them if you smoke, drink alcohol, or use illegal drugs. Some items may interact with your medicine. What should I watch for while using this medication? Your condition will be monitored carefully while you are receiving this medication. You may need blood work while taking this medication. This medication may affect your coordination, reaction time, or judgment. Do not drive or operate machinery until you know how this medication affects you. Sit up or stand slowly to reduce the risk of dizzy or fainting spells. Drinking alcohol with this medication can increase the risk of these side effects. This medication may increase your risk of getting an  infection. Call your care team for advice if you get a fever, chills, sore throat, or other symptoms of a cold or flu. Do not treat yourself. Try to avoid being around people who are sick. Check with your care team if you have severe diarrhea, nausea, and vomiting, or if you sweat a lot. The loss of too much body fluid may make it dangerous for you to take this medication. Talk  to your care team if you may be pregnant. Serious birth defects can occur if you take this medication during pregnancy and for 7 months after the last dose. You will need a negative pregnancy test before starting this medication. Contraception is recommended while taking this medication and for 7 months after the last dose. Your care team can help you find the option that works for you. If your partner can get pregnant, use a condom during sex while taking this medication and for 4 months after the last dose. Do not breastfeed while taking this medication and for 2 months after the last dose. This medication may cause infertility. Talk to your care team if you are concerned about your fertility. What side effects may I notice from receiving this medication? Side effects that you should report to your care team as soon as possible: Allergic reactions--skin rash, itching, hives, swelling of the face, lips, tongue, or throat Bleeding--bloody or black, tar-like stools, vomiting blood or brown material that looks like coffee grounds, red or dark brown urine, small red or purple spots on skin, unusual bruising or bleeding Bleeding in the brain--severe headache, stiff neck, confusion, dizziness, change in vision, numbness or weakness of the face, arm, or leg, trouble speaking, trouble walking, vomiting Bowel blockage--stomach cramping, unable to have a bowel movement or pass gas, loss of appetite, vomiting Heart failure--shortness of breath, swelling of the ankles, feet, or hands, sudden weight gain, unusual weakness or fatigue Infection--fever, chills, cough, sore throat, wounds that don't heal, pain or trouble when passing urine, general feeling of discomfort or being unwell Liver injury--right upper belly pain, loss of appetite, nausea, light-colored stool, dark yellow or brown urine, yellowing skin or eyes, unusual weakness or fatigue Low blood pressure--dizziness, feeling faint or lightheaded, blurry  vision Lung injury--shortness of breath or trouble breathing, cough, spitting up blood, chest pain, fever Pain, tingling, or numbness in the hands or feet Severe or prolonged diarrhea Stomach pain, bloody diarrhea, pale skin, unusual weakness or fatigue, decrease in the amount of urine, which may be signs of hemolytic uremic syndrome Sudden and severe headache, confusion, change in vision, seizures, which may be signs of posterior reversible encephalopathy syndrome (PRES) TTP--purple spots on the skin or inside the mouth, pale skin, yellowing skin or eyes, unusual weakness or fatigue, fever, fast or irregular heartbeat, confusion, change in vision, trouble speaking, trouble walking Tumor lysis syndrome (TLS)--nausea, vomiting, diarrhea, decrease in the amount of urine, dark urine, unusual weakness or fatigue, confusion, muscle pain or cramps, fast or irregular heartbeat, joint pain Side effects that usually do not require medical attention (report to your care team if they continue or are bothersome): Constipation Diarrhea Fatigue Loss of appetite Nausea This list may not describe all possible side effects. Call your doctor for medical advice about side effects. You may report side effects to FDA at 1-800-FDA-1088. Where should I keep my medication? This medication is given in a hospital or clinic. It will not be stored at home. NOTE: This sheet is a summary. It may not cover all possible  information. If you have questions about this medicine, talk to your doctor, pharmacist, or health care provider.  2024 Elsevier/Gold Standard (2021-10-12 00:00:00)       To help prevent nausea and vomiting after your treatment, we encourage you to take your nausea medication as directed.  BELOW ARE SYMPTOMS THAT SHOULD BE REPORTED IMMEDIATELY: *FEVER GREATER THAN 100.4 F (38 C) OR HIGHER *CHILLS OR SWEATING *NAUSEA AND VOMITING THAT IS NOT CONTROLLED WITH YOUR NAUSEA MEDICATION *UNUSUAL SHORTNESS OF  BREATH *UNUSUAL BRUISING OR BLEEDING *URINARY PROBLEMS (pain or burning when urinating, or frequent urination) *BOWEL PROBLEMS (unusual diarrhea, constipation, pain near the anus) TENDERNESS IN MOUTH AND THROAT WITH OR WITHOUT PRESENCE OF ULCERS (sore throat, sores in mouth, or a toothache) UNUSUAL RASH, SWELLING OR PAIN  UNUSUAL VAGINAL DISCHARGE OR ITCHING   Items with * indicate a potential emergency and should be followed up as soon as possible or go to the Emergency Department if any problems should occur.  Please show the CHEMOTHERAPY ALERT CARD or IMMUNOTHERAPY ALERT CARD at check-in to the Emergency Department and triage nurse.  Should you have questions after your visit or need to cancel or reschedule your appointment, please contact [facility] [phone]  and follow the prompts.  Office hours are 8:00 a.m. to 4:30 p.m. Monday - Friday. Please note that voicemails left after 4:00 p.m. may not be returned until the following business day.  We are closed weekends and major holidays. You have access to a nurse at all times for urgent questions. Please call the main number to the clinic [phone] and follow the prompts.  For any non-urgent questions, you may also contact your provider using MyChart. We now offer e-Visits for anyone 70 and older to request care online for non-urgent symptoms. For details visit [email or web address].   Also download the MyChart app! Go to the app store, search "MyChart", open the app, select [place], and log in with your MyChart username and password.

## 2022-10-26 NOTE — Progress Notes (Signed)
Continue to hold Xgeva per MD.  Richardean Sale, RPH, BCPS, BCOP 10/26/2022 12:49 PM

## 2022-10-26 NOTE — Progress Notes (Signed)
Patient has been examined by Dr. Ellin Saba. Vital signs and labs have been reviewed by MD - ANC, Creatinine, LFTs, hemoglobin, and platelets (81,000) are within treatment parameters per M.D. - pt may proceed with treatment.  Primary RN and pharmacy notified.

## 2022-10-27 ENCOUNTER — Other Ambulatory Visit: Payer: Medicare Other

## 2022-10-27 ENCOUNTER — Ambulatory Visit: Payer: Medicare Other

## 2022-10-27 ENCOUNTER — Ambulatory Visit: Payer: Medicare Other | Admitting: Hematology

## 2022-10-27 ENCOUNTER — Other Ambulatory Visit: Payer: Self-pay | Admitting: Family Medicine

## 2022-10-27 DIAGNOSIS — J301 Allergic rhinitis due to pollen: Secondary | ICD-10-CM

## 2022-11-09 ENCOUNTER — Inpatient Hospital Stay: Payer: Medicare Other

## 2022-11-09 VITALS — BP 138/72 | HR 88 | Temp 96.6°F | Resp 20 | Wt 166.7 lb

## 2022-11-09 DIAGNOSIS — Z5112 Encounter for antineoplastic immunotherapy: Secondary | ICD-10-CM | POA: Diagnosis not present

## 2022-11-09 DIAGNOSIS — C9000 Multiple myeloma not having achieved remission: Secondary | ICD-10-CM

## 2022-11-09 LAB — COMPREHENSIVE METABOLIC PANEL
ALT: 30 U/L (ref 0–44)
AST: 30 U/L (ref 15–41)
Albumin: 4.2 g/dL (ref 3.5–5.0)
Alkaline Phosphatase: 69 U/L (ref 38–126)
Anion gap: 9 (ref 5–15)
BUN: 22 mg/dL (ref 8–23)
CO2: 29 mmol/L (ref 22–32)
Calcium: 9.2 mg/dL (ref 8.9–10.3)
Chloride: 102 mmol/L (ref 98–111)
Creatinine, Ser: 0.85 mg/dL (ref 0.44–1.00)
GFR, Estimated: >60 mL/min (ref >60)
Glucose, Bld: 70 mg/dL (ref 70–99)
Potassium: 3.2 mmol/L — ABNORMAL LOW (ref 3.5–5.1)
Sodium: 140 mmol/L (ref 135–145)
Total Bilirubin: 0.8 mg/dL (ref 0.3–1.2)
Total Protein: 6.6 g/dL (ref 6.5–8.1)

## 2022-11-09 LAB — CBC WITH DIFFERENTIAL/PLATELET
Abs Immature Granulocytes: 0.02 10*3/uL (ref 0.00–0.07)
Basophils Absolute: 0.1 10*3/uL (ref 0.0–0.1)
Basophils Relative: 2 %
Eosinophils Absolute: 0.2 10*3/uL (ref 0.0–0.5)
Eosinophils Relative: 5 %
HCT: 36.8 % (ref 36.0–46.0)
Hemoglobin: 11.9 g/dL — ABNORMAL LOW (ref 12.0–15.0)
Immature Granulocytes: 1 %
Lymphocytes Relative: 23 %
Lymphs Abs: 0.9 10*3/uL (ref 0.7–4.0)
MCH: 34 pg (ref 26.0–34.0)
MCHC: 32.3 g/dL (ref 30.0–36.0)
MCV: 105.1 fL — ABNORMAL HIGH (ref 80.0–100.0)
Monocytes Absolute: 0.4 10*3/uL (ref 0.1–1.0)
Monocytes Relative: 9 %
Neutro Abs: 2.5 10*3/uL (ref 1.7–7.7)
Neutrophils Relative %: 60 %
Platelets: 101 10*3/uL — ABNORMAL LOW (ref 150–400)
RBC: 3.5 MIL/uL — ABNORMAL LOW (ref 3.87–5.11)
RDW: 17.3 % — ABNORMAL HIGH (ref 11.5–15.5)
WBC: 4.1 10*3/uL (ref 4.0–10.5)
nRBC: 0 % (ref 0.0–0.2)

## 2022-11-09 LAB — MAGNESIUM: Magnesium: 1.9 mg/dL (ref 1.7–2.4)

## 2022-11-09 MED ORDER — BORTEZOMIB CHEMO SQ INJECTION 3.5 MG (2.5MG/ML)
1.0000 mg/m2 | Freq: Once | INTRAMUSCULAR | Status: AC
  Administered 2022-11-09: 1.75 mg via SUBCUTANEOUS
  Filled 2022-11-09: qty 0.7

## 2022-11-09 MED ORDER — PROCHLORPERAZINE MALEATE 10 MG PO TABS
10.0000 mg | ORAL_TABLET | Freq: Once | ORAL | Status: AC
  Administered 2022-11-09: 10 mg via ORAL
  Filled 2022-11-09: qty 1

## 2022-11-09 NOTE — Progress Notes (Signed)
Patient presents today for Velcade infusion.  Patient is in satisfactory condition with no new complaints voiced.  Vital signs are stable.  Labs reviewed and all labs are within treatment parameters.  We will proceed with treatment per MD orders.    Treatment given today per MD orders. Tolerated infusion without adverse affects. Vital signs stable. No complaints at this time. Discharged from clinic ambulatory with walker in stable condition. Alert and oriented x 3. F/U with [facility] as scheduled.

## 2022-11-09 NOTE — Patient Instructions (Signed)
[facility] [place]  Discharge Instructions: Thank you for choosing [facility] to provide your oncology and hematology care.  If you have a lab appointment with the Cancer Center - please note that after April 8th, 2024, all labs will be drawn in the cancer center.  You do not have to check in or register with the main entrance as you have in the past but will complete your check-in in the cancer center.  Wear comfortable clothing and clothing appropriate for easy access to any Portacath or PICC line.   We strive to give you quality time with your provider. You may need to reschedule your appointment if you arrive late (15 or more minutes).  Arriving late affects you and other patients whose appointments are after yours.  Also, if you miss three or more appointments without notifying the office, you may be dismissed from the clinic at the provider's discretion.      For prescription refill requests, have your pharmacy contact our office and allow 72 hours for refills to be completed.    Today you received the following chemotherapy and/or immunotherapy agents Velcade   To help prevent nausea and vomiting after your treatment, we encourage you to take your nausea medication as directed.  Bortezomib Injection What is this medication? BORTEZOMIB (bor TEZ oh mib) treats lymphoma. It may also be used to treat multiple myeloma, a type of bone marrow cancer. It works by blocking a protein that causes cancer cells to grow and multiply. This helps to slow or stop the spread of cancer cells. This medicine may be used for other purposes; ask your health care provider or pharmacist if you have questions. COMMON BRAND NAME(S): Velcade What should I tell my care team before I take this medication? They need to know if you have any of these conditions: Dehydration Diabetes Heart disease Liver disease Tingling of the fingers or toes or other nerve disorder An unusual or  allergic reaction to bortezomib, other medications, foods, dyes, or preservatives If you or your partner are pregnant or trying to get pregnant Breastfeeding How should I use this medication? This medication is injected into a vein or under the skin. It is given by your care team in a hospital or clinic setting. Talk to your care team about the use of this medication in children. Special care may be needed. Overdosage: If you think you have taken too much of this medicine contact a poison control center or emergency room at once. NOTE: This medicine is only for you. Do not share this medicine with others. What if I miss a dose? Keep appointments for follow-up doses. It is important not to miss your dose. Call your care team if you are unable to keep an appointment. What may interact with this medication? Ketoconazole Rifampin This list may not describe all possible interactions. Give your health care provider a list of all the medicines, herbs, non-prescription drugs, or dietary supplements you use. Also tell them if you smoke, drink alcohol, or use illegal drugs. Some items may interact with your medicine. What should I watch for while using this medication? Your condition will be monitored carefully while you are receiving this medication. You may need blood work while taking this medication. This medication may affect your coordination, reaction time, or judgment. Do not drive or operate machinery until you know how this medication affects you. Sit up or stand slowly to reduce the risk of dizzy or fainting spells. Drinking alcohol   with this medication can increase the risk of these side effects. This medication may increase your risk of getting an infection. Call your care team for advice if you get a fever, chills, sore throat, or other symptoms of a cold or flu. Do not treat yourself. Try to avoid being around people who are sick. Check with your care team if you have severe diarrhea, nausea,  and vomiting, or if you sweat a lot. The loss of too much body fluid may make it dangerous for you to take this medication. Talk to your care team if you may be pregnant. Serious birth defects can occur if you take this medication during pregnancy and for 7 months after the last dose. You will need a negative pregnancy test before starting this medication. Contraception is recommended while taking this medication and for 7 months after the last dose. Your care team can help you find the option that works for you. If your partner can get pregnant, use a condom during sex while taking this medication and for 4 months after the last dose. Do not breastfeed while taking this medication and for 2 months after the last dose. This medication may cause infertility. Talk to your care team if you are concerned about your fertility. What side effects may I notice from receiving this medication? Side effects that you should report to your care team as soon as possible: Allergic reactions--skin rash, itching, hives, swelling of the face, lips, tongue, or throat Bleeding--bloody or black, tar-like stools, vomiting blood or brown material that looks like coffee grounds, red or dark brown urine, small red or purple spots on skin, unusual bruising or bleeding Bleeding in the brain--severe headache, stiff neck, confusion, dizziness, change in vision, numbness or weakness of the face, arm, or leg, trouble speaking, trouble walking, vomiting Bowel blockage--stomach cramping, unable to have a bowel movement or pass gas, loss of appetite, vomiting Heart failure--shortness of breath, swelling of the ankles, feet, or hands, sudden weight gain, unusual weakness or fatigue Infection--fever, chills, cough, sore throat, wounds that don't heal, pain or trouble when passing urine, general feeling of discomfort or being unwell Liver injury--right upper belly pain, loss of appetite, nausea, light-colored stool, dark yellow or brown  urine, yellowing skin or eyes, unusual weakness or fatigue Low blood pressure--dizziness, feeling faint or lightheaded, blurry vision Lung injury--shortness of breath or trouble breathing, cough, spitting up blood, chest pain, fever Pain, tingling, or numbness in the hands or feet Severe or prolonged diarrhea Stomach pain, bloody diarrhea, pale skin, unusual weakness or fatigue, decrease in the amount of urine, which may be signs of hemolytic uremic syndrome Sudden and severe headache, confusion, change in vision, seizures, which may be signs of posterior reversible encephalopathy syndrome (PRES) TTP--purple spots on the skin or inside the mouth, pale skin, yellowing skin or eyes, unusual weakness or fatigue, fever, fast or irregular heartbeat, confusion, change in vision, trouble speaking, trouble walking Tumor lysis syndrome (TLS)--nausea, vomiting, diarrhea, decrease in the amount of urine, dark urine, unusual weakness or fatigue, confusion, muscle pain or cramps, fast or irregular heartbeat, joint pain Side effects that usually do not require medical attention (report to your care team if they continue or are bothersome): Constipation Diarrhea Fatigue Loss of appetite Nausea This list may not describe all possible side effects. Call your doctor for medical advice about side effects. You may report side effects to FDA at 1-800-FDA-1088. Where should I keep my medication? This medication is given in a hospital or   clinic. It will not be stored at home. NOTE: This sheet is a summary. It may not cover all possible information. If you have questions about this medicine, talk to your doctor, pharmacist, or health care provider.  2024 Elsevier/Gold Standard (2021-10-12 00:00:00)   BELOW ARE SYMPTOMS THAT SHOULD BE REPORTED IMMEDIATELY: *FEVER GREATER THAN 100.4 F (38 C) OR HIGHER *CHILLS OR SWEATING *NAUSEA AND VOMITING THAT IS NOT CONTROLLED WITH YOUR NAUSEA MEDICATION *UNUSUAL SHORTNESS OF  BREATH *UNUSUAL BRUISING OR BLEEDING *URINARY PROBLEMS (pain or burning when urinating, or frequent urination) *BOWEL PROBLEMS (unusual diarrhea, constipation, pain near the anus) TENDERNESS IN MOUTH AND THROAT WITH OR WITHOUT PRESENCE OF ULCERS (sore throat, sores in mouth, or a toothache) UNUSUAL RASH, SWELLING OR PAIN  UNUSUAL VAGINAL DISCHARGE OR ITCHING   Items with * indicate a potential emergency and should be followed up as soon as possible or go to the Emergency Department if any problems should occur.  Please show the CHEMOTHERAPY ALERT CARD or IMMUNOTHERAPY ALERT CARD at check-in to the Emergency Department and triage nurse.  Should you have questions after your visit or need to cancel or reschedule your appointment, please contact [facility] [place] [phone]  and follow the prompts.  Office hours are 8:00 a.m. to 4:30 p.m. Monday - Friday. Please note that voicemails left after 4:00 p.m. may not be returned until the following business day.  We are closed weekends and major holidays. You have access to a nurse at all times for urgent questions. Please call the main number to the clinic [phone] and follow the prompts.  For any non-urgent questions, you may also contact your provider using MyChart. We now offer e-Visits for anyone 18 and older to request care online for non-urgent symptoms. For details visit [email or web address].   Also download the MyChart app! Go to the app store, search "MyChart", open the app, select [place], and log in with your MyChart username and password.   

## 2022-11-10 LAB — KAPPA/LAMBDA LIGHT CHAINS
Kappa free light chain: 10.7 mg/L (ref 3.3–19.4)
Kappa, lambda light chain ratio: 0.74 (ref 0.26–1.65)
Lambda free light chains: 14.5 mg/L (ref 5.7–26.3)

## 2022-11-14 ENCOUNTER — Other Ambulatory Visit: Payer: Self-pay

## 2022-11-14 ENCOUNTER — Emergency Department (HOSPITAL_COMMUNITY)
Admission: EM | Admit: 2022-11-14 | Discharge: 2022-11-14 | Disposition: A | Discharge status: Home / Self Care | Payer: Medicare Other | Attending: Student | Admitting: Student

## 2022-11-14 ENCOUNTER — Encounter (HOSPITAL_COMMUNITY): Payer: Self-pay | Admitting: Emergency Medicine

## 2022-11-14 ENCOUNTER — Emergency Department (HOSPITAL_COMMUNITY): Payer: Medicare Other

## 2022-11-14 DIAGNOSIS — R002 Palpitations: Secondary | ICD-10-CM | POA: Diagnosis not present

## 2022-11-14 DIAGNOSIS — I10 Essential (primary) hypertension: Secondary | ICD-10-CM | POA: Insufficient documentation

## 2022-11-14 DIAGNOSIS — Z8579 Personal history of other malignant neoplasms of lymphoid, hematopoietic and related tissues: Secondary | ICD-10-CM | POA: Insufficient documentation

## 2022-11-14 DIAGNOSIS — Z950 Presence of cardiac pacemaker: Secondary | ICD-10-CM | POA: Insufficient documentation

## 2022-11-14 DIAGNOSIS — R42 Dizziness and giddiness: Secondary | ICD-10-CM

## 2022-11-14 DIAGNOSIS — J45909 Unspecified asthma, uncomplicated: Secondary | ICD-10-CM | POA: Diagnosis not present

## 2022-11-14 DIAGNOSIS — I2510 Atherosclerotic heart disease of native coronary artery without angina pectoris: Secondary | ICD-10-CM | POA: Diagnosis not present

## 2022-11-14 DIAGNOSIS — R519 Headache, unspecified: Secondary | ICD-10-CM | POA: Insufficient documentation

## 2022-11-14 DIAGNOSIS — R531 Weakness: Secondary | ICD-10-CM | POA: Insufficient documentation

## 2022-11-14 LAB — CBC
HCT: 39.2 % (ref 36.0–46.0)
Hemoglobin: 13.4 g/dL (ref 12.0–15.0)
MCH: 34.3 pg — ABNORMAL HIGH (ref 26.0–34.0)
MCHC: 34.2 g/dL (ref 30.0–36.0)
MCV: 100.3 fL — ABNORMAL HIGH (ref 80.0–100.0)
Platelets: 81 10*3/uL — ABNORMAL LOW (ref 150–400)
RBC: 3.91 MIL/uL (ref 3.87–5.11)
RDW: 16.6 % — ABNORMAL HIGH (ref 11.5–15.5)
WBC: 6 10*3/uL (ref 4.0–10.5)
nRBC: 0 % (ref 0.0–0.2)

## 2022-11-14 LAB — URINALYSIS, ROUTINE W REFLEX MICROSCOPIC
Bilirubin Urine: NEGATIVE
Glucose, UA: NEGATIVE mg/dL
Hgb urine dipstick: NEGATIVE
Ketones, ur: NEGATIVE mg/dL
Nitrite: NEGATIVE
Protein, ur: NEGATIVE mg/dL
Specific Gravity, Urine: 1.006 (ref 1.005–1.030)
pH: 6 (ref 5.0–8.0)

## 2022-11-14 LAB — PROTEIN ELECTROPHORESIS, SERUM
A/G Ratio: 2 — ABNORMAL HIGH (ref 0.7–1.7)
Albumin ELP: 4.2 g/dL (ref 2.9–4.4)
Alpha-1-Globulin: 0.3 g/dL (ref 0.0–0.4)
Alpha-2-Globulin: 0.5 g/dL (ref 0.4–1.0)
Beta Globulin: 0.9 g/dL (ref 0.7–1.3)
Gamma Globulin: 0.4 g/dL (ref 0.4–1.8)
Globulin, Total: 2.1 g/dL — ABNORMAL LOW (ref 2.2–3.9)
Total Protein ELP: 6.3 g/dL (ref 6.0–8.5)

## 2022-11-14 LAB — COMPREHENSIVE METABOLIC PANEL
ALT: 30 U/L (ref 0–44)
AST: 30 U/L (ref 15–41)
Albumin: 4.9 g/dL (ref 3.5–5.0)
Alkaline Phosphatase: 45 U/L (ref 38–126)
Anion gap: 16 — ABNORMAL HIGH (ref 5–15)
BUN: 20 mg/dL (ref 8–23)
CO2: 23 mmol/L (ref 22–32)
Calcium: 9.2 mg/dL (ref 8.9–10.3)
Chloride: 93 mmol/L — ABNORMAL LOW (ref 98–111)
Creatinine, Ser: 1.26 mg/dL — ABNORMAL HIGH (ref 0.44–1.00)
GFR, Estimated: 45 mL/min — ABNORMAL LOW (ref >60)
Glucose, Bld: 105 mg/dL — ABNORMAL HIGH (ref 70–99)
Potassium: 2.9 mmol/L — ABNORMAL LOW (ref 3.5–5.1)
Sodium: 132 mmol/L — ABNORMAL LOW (ref 135–145)
Total Bilirubin: 1.8 mg/dL — ABNORMAL HIGH (ref 0.3–1.2)
Total Protein: 7.5 g/dL (ref 6.5–8.1)

## 2022-11-14 LAB — CBG MONITORING, ED: Glucose-Capillary: 89 mg/dL (ref 70–99)

## 2022-11-14 LAB — PROTIME-INR
INR: 2.3 — ABNORMAL HIGH (ref 0.8–1.2)
Prothrombin Time: 25.5 seconds — ABNORMAL HIGH (ref 11.4–15.2)

## 2022-11-14 LAB — IMMUNOFIXATION ELECTROPHORESIS
IgA: 156 mg/dL (ref 64–422)
IgG (Immunoglobin G), Serum: 457 mg/dL — ABNORMAL LOW (ref 586–1602)
IgM (Immunoglobulin M), Srm: 28 mg/dL (ref 26–217)
Total Protein ELP: 6.2 g/dL (ref 6.0–8.5)

## 2022-11-14 MED ORDER — POTASSIUM CHLORIDE CRYS ER 20 MEQ PO TBCR
40.0000 meq | EXTENDED_RELEASE_TABLET | Freq: Once | ORAL | Status: AC
  Administered 2022-11-14: 40 meq via ORAL
  Filled 2022-11-14: qty 2

## 2022-11-14 MED ORDER — MAGNESIUM OXIDE -MG SUPPLEMENT 400 (240 MG) MG PO TABS
800.0000 mg | ORAL_TABLET | Freq: Once | ORAL | Status: AC
  Administered 2022-11-14: 800 mg via ORAL
  Filled 2022-11-14: qty 2

## 2022-11-14 MED ORDER — LACTATED RINGERS IV BOLUS
1000.0000 mL | Freq: Once | INTRAVENOUS | Status: AC
  Administered 2022-11-14: 1000 mL via INTRAVENOUS

## 2022-11-14 MED ORDER — PROCHLORPERAZINE EDISYLATE 10 MG/2ML IJ SOLN
10.0000 mg | Freq: Once | INTRAMUSCULAR | Status: AC
  Administered 2022-11-14: 10 mg via INTRAVENOUS
  Filled 2022-11-14: qty 2

## 2022-11-14 MED ORDER — DIPHENHYDRAMINE HCL 50 MG/ML IJ SOLN
25.0000 mg | Freq: Once | INTRAMUSCULAR | Status: AC
  Administered 2022-11-14: 25 mg via INTRAVENOUS
  Filled 2022-11-14: qty 1

## 2022-11-14 NOTE — ED Notes (Signed)
Pt ambulated from bathroom to exam room without assistance. Approx. 25'. No incident to report.

## 2022-11-14 NOTE — ED Provider Notes (Signed)
[place] EMERGENCY DEPARTMENT AT [facility] Provider Note  CSN: [id number] Arrival date & time: 11/14/22 1452  Chief Complaint(s) Weakness, Dizziness, and Palpitations  HPI Courtney Rogers is a 72 y.o. female with PMH IgA multiple myeloma on Velcade and Revlimid, CAD status post MI but no DES placement, pulmonary embolism currently on warfarin who presents emergency department for evaluation of generalized weakness, fatigue and lightheadedness.  She states that she received an MRI 3 days ago in [place] and upon calling the radiology department appears that this was only an MRI C-spine that showed some mild foraminal stenosis but no canal stenosis.  No intracranial imaging obtained at that time.  She states that her symptoms have been ongoing for 2 weeks and came today because she was tired of the symptoms.  Denies vertigo, nausea, vomiting, diarrhea or other systemic symptoms.  Endorses persistent frontal headache but denies unilateral weakness, numbness, tingling, vision changes or other systemic symptoms.   Past Medical History Past Medical History:  Diagnosis Date   Acute myocardial infarction [facility]) 2009   CAD/no stent medically managed   Anxiety disorder    CAD (coronary artery disease)    Cancer (HCC)    multiple myeloma   Hyperlipidemia    Hypertension    IBS (irritable bowel syndrome)    Non-ulcer dyspepsia 04/30/2009   Qualifier: Diagnosis of  By: Darrick Penna MD, Sandi L    Overactive bladder    PE (pulmonary embolism) 10/2012   left lung   Presence of permanent cardiac pacemaker    Sleep apnea    Patient Active Problem List   Diagnosis Date Noted   Social isolation 09/30/2022   Acute left-sided thoracic back pain 02/10/2022   Atypical chest pain 01/13/2022   Fall 09/16/2021   Anxiety and depression 06/18/2021   Fatigue 06/04/2021   Abnormal gait 05/06/2021   Amnesia 05/06/2021   Anemia due to chronic blood loss 05/06/2021   Asthma 05/06/2021    Atherosclerotic heart disease of native coronary artery without angina pectoris 05/06/2021   Cardiac pacemaker in situ 05/06/2021   Chronic pain 05/06/2021   Coagulation disorder (HCC) 05/06/2021   Panic disorder 05/06/2021   H/O: hysterectomy 05/06/2021   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   Long term (current) use of anticoagulants 05/06/2021   Major depressive disorder, single episode, unspecified 05/06/2021   Mild intermittent asthma 05/06/2021   Mixed hyperlipidemia 05/06/2021   Monoclonal gammopathy 05/06/2021   Obesity 05/06/2021   Personal history of pulmonary embolism 05/06/2021   Recurrent major depression in remission (HCC) 05/06/2021   Severe recurrent major depression without psychotic features (HCC) 05/06/2021   Skin sensation disturbance 05/06/2021   Unspecified mononeuropathy of left upper limb 05/06/2021   Urinary incontinence 05/06/2021   Tremors of nervous system 04/13/2021   Left hip pain 04/13/2021   Allergic rhinitis 03/22/2021   Allergic sinusitis 03/22/2021   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   Abdominal pain 02/02/2021   Buttock pain 10/01/2020   Right knee pain 07/15/2020   Immunization due 07/15/2020   Encounter for subsequent annual wellness visit (AWV) in Medicare patient 07/15/2020   Easy bruising 04/30/2020   Wedge compression fracture of first lumbar vertebra, subsequent encounter for fracture with routine healing 04/06/2020   Trapezius muscle spasm 02/27/2020   Body mass index (BMI) 28.0-28.9, adult 01/14/2020   DDD (degenerative disc disease), cervical 01/02/2020   Hoarseness of voice 11/27/2019   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 Authorizing Provider  acetaminophen (TYLENOL) 500 MG tablet Take 1,000 mg by mouth 2 (two) times daily as needed for moderate pain or headache.    [provider]  acyclovir (ZOVIRAX) 400 MG tablet TAKE 1 TABLET BY MOUTH TWICE DAILY 04/19/22   Doreatha Massed, MD  albuterol (VENTOLIN HFA) 108 (90 Base) MCG/ACT inhaler USE 2 PUFFS ONE TO TWO TIMES A DAY. 05/11/22   Gilmore Laroche, FNP  amLODipine (NORVASC) 10 MG tablet TAKE ONE TABLET BY MOUTH ONCE DAILY 10/18/22   Doreatha Massed, MD  ascorbic acid (VITAMIN C) 500 MG tablet Take 500 mg by mouth daily. 02/07/22   [provider]  aspirin EC 81 MG tablet Take 81 mg by mouth at bedtime.    [provider]  B Complex Vitamins (VITAMIN B COMPLEX) TABS Take 1 tablet by mouth daily.    [provider]  B Complex-Folic Acid (B COMPLEX VITAMINS, W/ FA,) CAPS Take 1 capsule by mouth daily. 09/09/21   [provider]  bortezomib SQ (VELCADE) 3.5 MG injection Inject into the skin once. 08/10/21   [provider]  busPIRone (BUSPAR) 5 MG tablet Take 1 tablet (5 mg total) by mouth daily. 09/23/21   [name], FNP  Calcium Carbonate-Vitamin D (CALCIUM 600+D PO) Take 1 tablet by mouth 2 (two) times daily.    [provider]  chlorthalidone (HYGROTON) 25 MG tablet Take 25 mg by mouth daily. 06/04/22   [provider]  cholecalciferol (VITAMIN D3) 10 MCG (400 UNIT) TABS tablet Take 400 Units by mouth daily. 09/30/22   [provider]  cholecalciferol 25 MCG (1000 UT) tablet Take by mouth. 10/15/20   [provider]  cyanocobalamin (VITAMIN B12) 1000 MCG tablet Take by mouth. 10/12/20   [provider]  cyclobenzaprine (FLEXERIL) 10 MG tablet TAKE ONE-HALF TABLET BY MOUTH TWICE DAILY AS NEEDED FOR MUSCLE SPASMS 09/12/22   Gilmore Laroche, FNP  Daratumumab-Hyaluronidase-fihj (DARZALEX FASPRO ) Inject  into the skin. 08/10/21   [provider]  diclofenac Sodium (VOLTAREN) 1 % GEL Apply 4 g topically 4 (four) times daily. 11/19/21   Gilmore Laroche, FNP  doxycycline (VIBRAMYCIN) 100 MG capsule Take 100 mg by mouth daily. 02/25/22   [provider]  fluticasone (FLONASE) 50 MCG/ACT nasal spray USE 2 SPRAYS IN EACH NOSTRIL ONCE DAILY 10/27/22   Gilmore Laroche, FNP  fluticasone-salmeterol (ADVAIR) 250-50 MCG/ACT AEPB Inhale into the lungs. 10/12/20   [provider]  folic acid (FOLVITE) 1 MG tablet TAKE 1 TABLET BY MOUTH ONCE DAILY. 03/15/22   Doreatha Massed, MD  gabapentin (NEURONTIN) 100 MG capsule Take 100 mg by mouth 3 (three) times daily. 05/07/19   [provider]  hydrochlorothiazide (MICROZIDE) 12.5 MG capsule Take 12.5 mg by mouth daily. 11/04/21   [provider]  HYDROcodone-acetaminophen (NORCO/VICODIN) 5-325 MG tablet TAKE ONE TABLET BY MOUTH EVERY 8 HOURS AS NEEDED FOR MODERATE PAIN 10/10/22   Doreatha Massed, MD  Hyoscyamine Sulfate SL 0.125 MG SUBL     [provider]  lenalidomide (REVLIMID) 10 MG capsule Take 1 capsule (10 mg total) by mouth daily. 21 days on, 7 days off every 28 days  10/24/22   Doreatha Massed, MD  levocetirizine (XYZAL) 5 MG tablet TAKE ONE TABLET BY MOUTH IN THE EVENING 10/18/22   Gilmore Laroche, FNP  lidocaine (HM LIDOCAINE PATCH) 4 % Place 1 patch onto the skin daily. 03/21/22   Rondel Baton, MD  lidocaine (XYLOCAINE) 2 % solution Take 2 teaspoons before meals and at bedtime as needed. May repeat every 4 hours for a maximum dose of 8 per day. 05/10/22   Tiffany Kocher, PA-C  Lidocaine HCl (XOLIDO) 2 % CREA Apply 1 application topically daily as needed (arthritisis).    [provider]  loperamide (IMODIUM) 2 MG capsule Take 4 mg by mouth as needed. Take 2 tablets after the first loose stool and then 1 tablet after each loose stool.  Do not exceed 8 capsules within 24 hours.    [provider]  losartan (COZAAR) 100 MG tablet  01/16/21   [provider]  magnesium oxide (MAG-OX) 400 (240 Mg) MG tablet Take 1 tablet (400 mg total) by mouth 2 (two) times daily. 10/03/22   Doreatha Massed, MD  metoprolol succinate (TOPROL-XL) 50 MG 24 hr tablet  09/09/21   [provider]  metoprolol tartrate (LOPRESSOR) 50 MG tablet Take 1 tablet (50 mg total) by mouth 2 (two) times daily. Patient taking differently: Take 25 mg by mouth 2 (two) times daily. 10/07/21   Paseda, Baird Kay, FNP  mirabegron ER (MYRBETRIQ) 50 MG TB24 tablet Take 1 tablet (50 mg total) by mouth daily. 11/15/22   Gilmore Laroche, FNP  mirtazapine (REMERON) 15 MG tablet Take 15 mg by mouth at bedtime. 01/12/22   [provider]  montelukast (SINGULAIR) 10 MG tablet Take 10 mg by mouth daily.     [provider]  montelukast (SINGULAIR) 10 MG tablet Take 1 tablet (10 mg total) by mouth at bedtime. 09/30/22   Gilmore Laroche, FNP  nitroGLYCERIN (NITROSTAT) 0.4 MG SL tablet Place 0.4 mg under the tongue every 5 (five) minutes as needed for chest pain.    [provider]  pantoprazole (PROTONIX) 40 MG tablet Take 40 mg by mouth daily. 08/10/20   [provider]  potassium chloride (KLOR-CON M) 10 MEQ tablet  09/09/21   [provider]  potassium chloride SA (KLOR-CON M) 20 MEQ tablet Take 1 tablet (20 mEq total) by mouth 3 (three) times daily. 09/27/21   Dione Booze, MD  primidone (MYSOLINE) 50 MG tablet Take 75 mg by mouth daily. 03/31/21   [provider]  primidone (MYSOLINE) 50 MG tablet Take by mouth. 03/31/21   [provider]  Probiotic Product (GNP PROBIOTIC COLON SUPPORT) CAPS Take 1 capsule by mouth daily. 07/08/21   [provider]  sertraline (ZOLOFT) 100 MG tablet Take 200 mg by mouth daily.     [provider]  simvastatin (ZOCOR) 40 MG tablet TAKE ONE TABLET BY MOUTH AT BEDTIME 10/24/22   Gilmore Laroche, FNP   tadalafil, PAH, (ALYQ) 20 MG tablet Take 2 tablets every day by oral route.    [provider]  traMADol (ULTRAM) 50 MG tablet Take 50 mg by mouth 2 (two) times daily. 03/08/22   [provider]  trimethoprim-polymyxin b (POLYTRIM) ophthalmic solution INSTILL 1 DROP INTO AFFECTED EYE(S) BY OPHTHALMIC ROUTE EVERY 6 HOURS FOR 7 DAYS 09/19/21   [provider]  warfarin (COUMADIN) 3 MG tablet Take 3 mg by mouth daily. 06/23/21   [provider]  Past Surgical History Past Surgical History:  Procedure Laterality Date   ABDOMINAL HYSTERECTOMY     BSO secondary to cyst     CARDIAC CATHETERIZATION     COLONOSCOPY  12/2009   Dr. Aleene Davidson, propofol, normal. Next TCS 12/2019   COLONOSCOPY N/A 05/22/2017   Examined portion ileum was normal, significant looping of colon, external hemorrhoids and rectal bleeding due to internal hemorrhoids, mild diverticulosis procedure: COLONOSCOPY;  Surgeon: Rogers Bali, MD;  Location: [facility];  Service: Endoscopy;  Laterality: N/A;  10:30am   ESOPHAGOGASTRODUODENOSCOPY  05/11/2009   schatzki ring/small hiatal hernia/path:gastritis   ESOPHAGOGASTRODUODENOSCOPY N/A 05/22/2017   Multiple gastric polyps with biopsy benign fundic gland polyp, small bowel biopsy negative for celiac, gastric biopsy with mild gastritis but no H. pylori procedure: ESOPHAGOGASTRODUODENOSCOPY (EGD);  Surgeon: Rogers Bali, MD;  Location: [facility];  Service: Endoscopy;  Laterality: N/A;   ESOPHAGOGASTRODUODENOSCOPY (EGD) WITH ESOPHAGEAL DILATION N/A 04/01/2013   [id number]:[id number] at the gastroesophageal juction/multiple small polyps/mild gastritis   GIVENS CAPSULE STUDY N/A 06/07/2017   Frequent gastric erosions, normal small bowel mucosa.  Procedure: GIVENS CAPSULE STUDY;  Surgeon: Rogers Bali, MD;  Location: [facility];  Service: Endoscopy;  Laterality: N/A;  7:30am   INSERT / REPLACE / REMOVE PACEMAKER     Last year per pt.(can't remember date)   REPLACEMENT TOTAL KNEE Right 08/2020   Family History Family History  Problem Relation Age of Onset   Colon polyps Neg Hx    Colon cancer Neg Hx     Social History Social History   Tobacco Use   Smoking status: Never    Passive exposure: Never   Smokeless tobacco: Never   Tobacco comments:    Never smoked   Vaping Use   Vaping Use: Never used  Substance Use Topics   Alcohol use: Not Currently    Comment: occ wine   Drug use: No   Allergies Amoxicillin-pot clavulanate, Clarithromycin, Erythromycin, Lisinopril, and Amoxicillin  Review of Systems Review of Systems  Neurological:  Positive for weakness and light-headedness.    Physical Exam Vital Signs  I have reviewed the triage vital signs BP 93/70   Pulse 93   Temp 97.9 F (36.6 C) (Oral)   Resp 13   Ht 5\' 4"  (1.626 m)   Wt 72.6 kg   SpO2 95%   BMI 27.46 kg/m   Physical Exam Vitals and nursing note reviewed.  Constitutional:      General: She is not in acute distress.    Appearance: She is well-developed.  HENT:     Head: Normocephalic and atraumatic.  Eyes:     Conjunctiva/sclera: Conjunctivae normal.  Cardiovascular:     Rate and Rhythm: Normal rate and regular rhythm.     Heart sounds: No murmur heard. Pulmonary:     Effort: Pulmonary effort is normal. No respiratory distress.     Breath sounds: Normal breath sounds.  Abdominal:     Palpations: Abdomen is soft.     Tenderness: There is no abdominal tenderness.  Musculoskeletal:        General: No swelling.     Cervical back: Neck supple.  Skin:    General: Skin is warm and dry.     Capillary Refill: Capillary refill takes less than 2 seconds.  Neurological:     Mental Status: She is alert.  Psychiatric:        Mood and Affect: Mood normal.     ED Results  and Treatments Labs (all labs ordered are  listed, but only abnormal results are displayed) Labs Reviewed  CBC - Abnormal; Notable for the following components:      Result Value   MCV 100.3 (*)    MCH 34.3 (*)    RDW 16.6 (*)    Platelets 81 (*)    All other components within normal limits  URINALYSIS, ROUTINE W REFLEX MICROSCOPIC - Abnormal; Notable for the following components:   Leukocytes,Ua SMALL (*)    Bacteria, UA RARE (*)    All other components within normal limits  PROTIME-INR - Abnormal; Notable for the following components:   Prothrombin Time 25.5 (*)    INR 2.3 (*)    All other components within normal limits  COMPREHENSIVE METABOLIC PANEL - Abnormal; Notable for the following components:   Sodium 132 (*)    Potassium 2.9 (*)    Chloride 93 (*)    Glucose, Bld 105 (*)    Creatinine, Ser 1.26 (*)    Total Bilirubin 1.8 (*)    GFR, Estimated 45 (*)    Anion gap 16 (*)    All other components within normal limits  CBG MONITORING, ED                                                                                                                          Radiology DG Chest Port 1 View  Result Date: 11/14/2022 CLINICAL DATA:  One-week history of weakness and dizziness associated with acute onset palpitations EXAM: PORTABLE CHEST 1 VIEW COMPARISON:  Chest radiograph dated 01/07/2022 FINDINGS: Left chest wall pacemaker leads project over the right atrium and ventricle. Normal lung volumes. No focal consolidations. Similar bibasilar linear scarring/reticulations. No pleural effusion or pneumothorax. The heart size and mediastinal contours are within normal limits. No acute osseous abnormality. IMPRESSION: No active disease. Electronically Signed   By: Agustin Cree M.D.   On: 11/14/2022 15:36    Pertinent labs & imaging results that were available during my care of the patient were reviewed by me and considered in my medical decision making (see MDM for details).  Medications Ordered in ED Medications  prochlorperazine  (COMPAZINE) injection 10 mg (10 mg Intravenous Given 11/14/22 1556)  diphenhydrAMINE (BENADRYL) injection 25 mg (25 mg Intravenous Given 11/14/22 1556)  lactated ringers bolus 1,000 mL (0 mLs Intravenous Stopped 11/14/22 1728)  potassium chloride SA (KLOR-CON M) CR tablet 40 mEq (40 mEq Oral Given 11/14/22 1621)  magnesium oxide (MAG-OX) tablet 800 mg (800 mg Oral Given 11/14/22 1621)  Procedures Procedures  (including critical care time)  Medical Decision Making / ED Course   This patient presents to the ED for concern of dizziness, headache, this involves an extensive number of treatment options, and is a complaint that carries with it a high risk of complications and morbidity.  The differential diagnosis includes electrolyte abnormality, dehydration, migraine headache, intracranial bleed, hyperviscosity syndrome, BPPV, labyrinthitis  MDM: Patient seen emergency room for evaluation of dizziness and lightheadedness.  Physical exam is unremarkable with no focal motor or sensory deficits.  No cranial nerve deficits.  Laboratory evaluation with some mild hypokalemia to 2.9 which was repleted here in the emergency department, creatinine elevation to 1.26 which is minimally elevated from her baseline, hemoglobin normal, INR therapeutic, urinalysis unremarkable.  Chest x-ray unremarkable.  Patient has no focal motor or sensory deficits and with neuro exam unremarkable we will defer advanced intracranial imaging at this time.  She received a headache cocktail and her symptoms completely resolved.  She was able to ambulate without difficulty or return of symptoms.  Given concomitant hyponatremia, hypokalemia, hypochloremia and elevated creatinine, suspect a small element of dehydration and decreased p.o. intake as a source of her dizziness and she was encouraged to increase her  protein intake and water intake at home.  She was given return precautions which she voiced understanding she was discharged   Additional history obtained:  -External records from outside source obtained and reviewed including: Chart review including previous notes, labs, imaging, consultation notes   Lab Tests: -I ordered, reviewed, and interpreted labs.   The pertinent results include:   Labs Reviewed  CBC - Abnormal; Notable for the following components:      Result Value   MCV 100.3 (*)    MCH 34.3 (*)    RDW 16.6 (*)    Platelets 81 (*)    All other components within normal limits  URINALYSIS, ROUTINE W REFLEX MICROSCOPIC - Abnormal; Notable for the following components:   Leukocytes,Ua SMALL (*)    Bacteria, UA RARE (*)    All other components within normal limits  PROTIME-INR - Abnormal; Notable for the following components:   Prothrombin Time 25.5 (*)    INR 2.3 (*)    All other components within normal limits  COMPREHENSIVE METABOLIC PANEL - Abnormal; Notable for the following components:   Sodium 132 (*)    Potassium 2.9 (*)    Chloride 93 (*)    Glucose, Bld 105 (*)    Creatinine, Ser 1.26 (*)    Total Bilirubin 1.8 (*)    GFR, Estimated 45 (*)    Anion gap 16 (*)    All other components within normal limits  CBG MONITORING, ED      EKG   EKG Interpretation  Date/Time:  Monday November 14 2022 15:20:53 EDT Ventricular Rate:  95 PR Interval:  140 QRS Duration: 211 QT Interval:  474 QTC Calculation: 596 R Axis:   -60 Text Interpretation: Sinus rhythm Left bundle branch block faster rate than prior Confirmed by Theda Belfast ([id number]) on 11/15/2022 10:47:39 AM         Imaging Studies ordered: I ordered imaging studies including CXR I independently visualized and interpreted imaging. I agree with the radiologist interpretation   Medicines ordered and prescription drug management: Meds ordered this encounter  Medications   prochlorperazine  (COMPAZINE) injection 10 mg   diphenhydrAMINE (BENADRYL) injection 25 mg   lactated ringers bolus 1,000 mL   potassium chloride SA (KLOR-CON M) CR  tablet 40 mEq   magnesium oxide (MAG-OX) tablet 800 mg    -I have reviewed the patients home medicines and have made adjustments as needed  Critical interventions none   Cardiac Monitoring: The patient was maintained on a cardiac monitor.  I personally viewed and interpreted the cardiac monitored which showed an underlying rhythm of: NSR  Social Determinants of Health:  Factors impacting patients care include: none   Reevaluation: After the interventions noted above, I reevaluated the patient and found that they have :improved  Co morbidities that complicate the patient evaluation  Past Medical History:  Diagnosis Date   Acute myocardial infarction [facility]) 2009   CAD/no stent medically managed   Anxiety disorder    CAD (coronary artery disease)    Cancer (HCC)    multiple myeloma   Hyperlipidemia    Hypertension    IBS (irritable bowel syndrome)    Non-ulcer dyspepsia 04/30/2009   Qualifier: Diagnosis of  By: Darrick Penna MD, Sandi L    Overactive bladder    PE (pulmonary embolism) 10/2012   left lung   Presence of permanent cardiac pacemaker    Sleep apnea       Dispostion: I considered admission for this patient, but at this time she does not meet inpatient criteria for admission she is safe for discharge with outpatient follow-up     Final Clinical Impression(s) / ED Diagnoses Final diagnoses:  Dizziness  Acute nonintractable headache, unspecified headache type     @PCDICTATION @    Glendora Score, MD 11/15/22 1504

## 2022-11-14 NOTE — ED Triage Notes (Signed)
Pt via POV c/o weakness x 1 week and dizziness and palpitations since earlier today. Pt says she has had to hold onto walls for balance and has felt anxious. She had an MRI on Friday and notes that they had to deactivate her pacemaker for the procedure. Pt reports right knee pain rated 5/10. A/O x 4

## 2022-11-15 ENCOUNTER — Encounter: Payer: Self-pay | Admitting: Family Medicine

## 2022-11-15 ENCOUNTER — Ambulatory Visit (INDEPENDENT_AMBULATORY_CARE_PROVIDER_SITE_OTHER): Payer: Medicare Other | Admitting: Family Medicine

## 2022-11-15 ENCOUNTER — Telehealth: Payer: Self-pay

## 2022-11-15 VITALS — BP 110/62 | HR 106 | Ht 64.0 in | Wt 158.1 lb

## 2022-11-15 DIAGNOSIS — R200 Anesthesia of skin: Secondary | ICD-10-CM

## 2022-11-15 DIAGNOSIS — R202 Paresthesia of skin: Secondary | ICD-10-CM | POA: Diagnosis not present

## 2022-11-15 DIAGNOSIS — R32 Unspecified urinary incontinence: Secondary | ICD-10-CM

## 2022-11-15 DIAGNOSIS — R5383 Other fatigue: Secondary | ICD-10-CM | POA: Diagnosis not present

## 2022-11-15 MED ORDER — MIRABEGRON ER 50 MG PO TB24
50.0000 mg | ORAL_TABLET | Freq: Every day | ORAL | 0 refills | Status: DC
Start: 2022-11-15 — End: 2022-12-15

## 2022-11-15 NOTE — Patient Instructions (Addendum)
I appreciate the opportunity to provide care to you today!    Follow up:  4 weeks  Numbness Please verify wit your neurology if you should be taking gabapentin 100 mg TID and gabapentin 600 mg daily.   Fatigue You may stop taking xyzal 5 mg at bedtime I recommend avoiding heavy meals in the evening. Eat a well-balanced diet, which includes lean proteins, whole grains, plenty of fruits and vegetables, and low-fat dairy products. Avoid eating or drinking too many products with caffeine in them. Avoid alcohol. Drink enough fluid to keep your urine pale yellow. Monitor your fatigue for any changes. Go to bed and get up at the same time every day. Avoid fatigue by pacing yourself during the day and getting enough sleep at night.   Urinary Incontinence my Please start taking Myrbetriq 2 tablet (50mg  total) for your urinary frequency   Please continue to a heart-healthy diet and increase your physical activities. Try to exercise for at least five days a week.      It was a pleasure to see you and I look forward to continuing to work together on your health and well-being. Please do not hesitate to call the office if you need care or have questions about your care.   Have a wonderful day and week. With Gratitude, Gilmore Laroche MSN, FNP-BC

## 2022-11-15 NOTE — Progress Notes (Signed)
Established Patient Office Visit  Subjective:  Patient ID: Courtney Rogers, female    DOB: 10/06/1950  Age: 72 y.o. MRN: [id number]  CC:  Chief Complaint  Patient presents with   Fatigue    Pt reports feeling weak, states she can't seem to get it together, went to ED yesterday.    Numbness    Pt reports numbness on her feet, ongoing for months.     HPI Courtney Rogers is a 72 y.o. female with past medical history of with PMH IgA multiple myeloma on Velcade and Revlimid, CAD status post MI but no DES placement, pulmonary embolism currently on warfarin  presents for ED f/u   Dizziness: The patient was seen in the ED on 11/14/2022 with complaints of generalized weakness, fatigue,dizziness and lightheaded.  Chest x-rays was unremarkable. Her labs indicated hyponatremia, hypokalemia, hypochloremia and elevated creatinine.She was given a bolus of IV fluids and encouraged to increased her protein and water intake at home. Since discharged, the reports feeling worn out and tired. She reports minimal  sleep at night due to caring for her husband who has dementia. No vertigo, nausea, vomiting,unilateral weakness, numbness, tingling and vision changes. She reports numbness in the feet and reports taking gabapentin 100mg  TID, however the patient is confused if she take gabapentin 600 mg daily.  Past Medical History:  Diagnosis Date   Acute myocardial infarction [facility]) 2009   CAD/no stent medically managed   Anxiety disorder    CAD (coronary artery disease)    Cancer (HCC)    multiple myeloma   Hyperlipidemia    Hypertension    IBS (irritable bowel syndrome)    Non-ulcer dyspepsia 04/30/2009   Qualifier: Diagnosis of  By: Darrick Penna MD, Sandi L    Overactive bladder    PE (pulmonary embolism) 10/2012   left lung   Presence of permanent cardiac pacemaker    Sleep apnea     Past Surgical History:  Procedure Laterality Date   ABDOMINAL HYSTERECTOMY     BSO secondary to cyst     CARDIAC  CATHETERIZATION     COLONOSCOPY  12/2009   Dr. Aleene Davidson, propofol, normal. Next TCS 12/2019   COLONOSCOPY N/A 05/22/2017   Examined portion ileum was normal, significant looping of colon, external hemorrhoids and rectal bleeding due to internal hemorrhoids, mild diverticulosis procedure: COLONOSCOPY;  Surgeon: West Bali, MD;  [facility]: [facility];  Service: Endoscopy;  Laterality: N/A;  10:30am   ESOPHAGOGASTRODUODENOSCOPY  05/11/2009   schatzki ring/small hiatal hernia/path:gastritis   ESOPHAGOGASTRODUODENOSCOPY N/A 05/22/2017   Multiple gastric polyps with biopsy benign fundic gland polyp, small bowel biopsy negative for celiac, gastric biopsy with mild gastritis but no H. pylori procedure: ESOPHAGOGASTRODUODENOSCOPY (EGD);  Surgeon: West Bali, MD;  Location: [facility];  Service: Endoscopy;  Laterality: N/A;   ESOPHAGOGASTRODUODENOSCOPY (EGD) WITH ESOPHAGEAL DILATION N/A 04/01/2013   [id number] at the gastroesophageal juction/multiple small polyps/mild gastritis   GIVENS CAPSULE STUDY N/A 06/07/2017   Frequent gastric erosions, normal small bowel mucosa.  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    Family History  Problem Relation Age of Onset   Colon polyps Neg Hx    Colon cancer Neg Hx     Social History  Socioeconomic History   Marital status: Married    Spouse name: [name]   Number of children: 3   Years of education: 16   Highest education level: Not on file  Occupational History   Not on file  Tobacco Use   Smoking status: Never    Passive exposure: Never   Smokeless tobacco: Never   Tobacco comments:    Never smoked   Vaping Use   Vaping Use: Never used  Substance and Sexual Activity   Alcohol use: Not Currently    Comment: occ  wine   Drug use: No   Sexual activity: Not Currently  Other Topics Concern   Not on file  Social History Narrative      Lives with husband-51 years 950 in Aug 2021   Daughter is close by, 2 sons live further away   One IN [place],ONE IN [place], ONE BESIDE HER.        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   Enjoys: reading, young adult      Diet: eats all food groups    Caffeine: coffee 1, tea daily soda-1 daily   Water: 1-2 cups      Wears seat belt    Does not use phone while driving   Smoke detectors at home    Licensed conveyancer -locked up      Left handed   One story home   Drinks caffeine   Social Determinants of Health   Financial Resource Strain: Low Risk  (11/15/2021)   Overall Financial Resource Strain (CARDIA)    Difficulty of Paying Living Expenses: Not hard at all  Food Insecurity: No Food Insecurity (11/15/2021)   Hunger Vital Sign    Worried About Running Out of Food in the Last Year: Never true    Ran Out of Food in the Last Year: Never true  Transportation Needs: No Transportation Needs (11/15/2021)   PRAPARE - Administrator, Civil Service (Medical): No    Lack of Transportation (Non-Medical): No  Physical Activity: Inactive (11/26/2021)   Exercise Vital Sign    Days of Exercise per Week: 0 days    Minutes of Exercise per Session: 0 min  Stress: Stress Concern Present (11/26/2021)   [employer] of Occupational Health - Occupational Stress Questionnaire    Feeling of Stress : To some extent  Social Connections: Moderately Integrated (11/11/2020)   Social Connection and Isolation Panel [NHANES]    Frequency of Communication with Friends and Family: Once a week    Frequency of Social Gatherings with Friends and Family: Once a week    Attends Religious Services: More than 4 times per year    Active Member of [employer] or Organizations: Yes    Attends Banker Meetings: More than 4 times per year    Marital  Status: Married  Catering manager Violence: Not At Risk (11/11/2020)   Humiliation, Afraid, Rape, and Kick questionnaire    Fear of Current or Ex-Partner: No    Emotionally Abused: No    Physically Abused: No    Sexually Abused: No    Outpatient Medications Prior to Visit  Medication Sig Dispense Refill   acetaminophen (TYLENOL) 500 MG tablet Take 1,000 mg by mouth 2 (two) times daily as needed for moderate pain or headache.     acyclovir (ZOVIRAX) 400 MG tablet TAKE 1 TABLET BY MOUTH TWICE DAILY 60 tablet 6   albuterol (VENTOLIN HFA) 108 (90 Base) MCG/ACT inhaler  USE 2 PUFFS ONE TO TWO TIMES A DAY. 18 g 5   amLODipine (NORVASC) 10 MG tablet TAKE ONE TABLET BY MOUTH ONCE DAILY 90 tablet 0   ascorbic acid (VITAMIN C) 500 MG tablet Take 500 mg by mouth daily.     aspirin EC 81 MG tablet Take 81 mg by mouth at bedtime.     B Complex Vitamins (VITAMIN B COMPLEX) TABS Take 1 tablet by mouth daily.     B Complex-Folic Acid (B COMPLEX VITAMINS, W/ FA,) CAPS Take 1 capsule by mouth daily.     bortezomib SQ (VELCADE) 3.5 MG injection Inject into the skin once.     busPIRone (BUSPAR) 5 MG tablet Take 1 tablet (5 mg total) by mouth daily. 30 tablet 3   Calcium Carbonate-Vitamin D (CALCIUM 600+D PO) Take 1 tablet by mouth 2 (two) times daily.     chlorthalidone (HYGROTON) 25 MG tablet Take 25 mg by mouth daily.     cholecalciferol (VITAMIN D3) 10 MCG (400 UNIT) TABS tablet Take 400 Units by mouth daily.     cholecalciferol 25 MCG (1000 UT) tablet Take by mouth.     cyanocobalamin (VITAMIN B12) 1000 MCG tablet Take by mouth.     cyclobenzaprine (FLEXERIL) 10 MG tablet TAKE ONE-HALF TABLET BY MOUTH TWICE DAILY AS NEEDED FOR MUSCLE SPASMS 20 tablet 0   Daratumumab-Hyaluronidase-fihj (DARZALEX FASPRO Bath) Inject into the skin.     diclofenac Sodium (VOLTAREN) 1 % GEL Apply 4 g topically 4 (four) times daily. 50 g 0   doxycycline (VIBRAMYCIN) 100 MG capsule Take 100 mg by mouth daily.     fluticasone  (FLONASE) 50 MCG/ACT nasal spray USE 2 SPRAYS IN EACH NOSTRIL ONCE DAILY 16 g 0   fluticasone-salmeterol (ADVAIR) 250-50 MCG/ACT AEPB Inhale into the lungs.     folic acid (FOLVITE) 1 MG tablet TAKE 1 TABLET BY MOUTH ONCE DAILY. 30 tablet 11   gabapentin (NEURONTIN) 100 MG capsule Take 100 mg by mouth 3 (three) times daily.     hydrochlorothiazide (MICROZIDE) 12.5 MG capsule Take 12.5 mg by mouth daily.     HYDROcodone-acetaminophen (NORCO/VICODIN) 5-325 MG tablet TAKE ONE TABLET BY MOUTH EVERY 8 HOURS AS NEEDED FOR MODERATE PAIN 60 tablet 0   Hyoscyamine Sulfate SL 0.125 MG SUBL      lenalidomide (REVLIMID) 10 MG capsule Take 1 capsule (10 mg total) by mouth daily. 21 days on, 7 days off every 28 days 21 capsule 0   levocetirizine (XYZAL) 5 MG tablet TAKE ONE TABLET BY MOUTH IN THE EVENING 30 tablet 1   lidocaine (HM LIDOCAINE PATCH) 4 % Place 1 patch onto the skin daily. 14 patch 0   lidocaine (XYLOCAINE) 2 % solution Take 2 teaspoons before meals and at bedtime as needed. May repeat every 4 hours for a maximum dose of 8 per day. 300 mL 0   Lidocaine HCl (XOLIDO) 2 % CREA Apply 1 application topically daily as needed (arthritisis).     loperamide (IMODIUM) 2 MG capsule Take 4 mg by mouth as needed. Take 2 tablets after the first loose stool and then 1 tablet after each loose stool.  Do not exceed 8 capsules within 24 hours.     losartan (COZAAR) 100 MG tablet      magnesium oxide (MAG-OX) 400 (240 Mg) MG tablet Take 1 tablet (400 mg total) by mouth 2 (two) times daily. 60 tablet 4   metoprolol succinate (TOPROL-XL) 50 MG 24 hr tablet  metoprolol tartrate (LOPRESSOR) 50 MG tablet Take 1 tablet (50 mg total) by mouth 2 (two) times daily. (Patient taking differently: Take 25 mg by mouth 2 (two) times daily.) 180 tablet 1   mirtazapine (REMERON) 15 MG tablet Take 15 mg by mouth at bedtime.     montelukast (SINGULAIR) 10 MG tablet Take 10 mg by mouth daily.      montelukast (SINGULAIR) 10 MG  tablet Take 1 tablet (10 mg total) by mouth at bedtime. 30 tablet 3   nitroGLYCERIN (NITROSTAT) 0.4 MG SL tablet Place 0.4 mg under the tongue every 5 (five) minutes as needed for chest pain.     pantoprazole (PROTONIX) 40 MG tablet Take 40 mg by mouth daily.     potassium chloride (KLOR-CON M) 10 MEQ tablet      potassium chloride SA (KLOR-CON M) 20 MEQ tablet Take 1 tablet (20 mEq total) by mouth 3 (three) times daily. 21 tablet 0   primidone (MYSOLINE) 50 MG tablet Take 75 mg by mouth daily.     primidone (MYSOLINE) 50 MG tablet Take by mouth.     Probiotic Product (GNP PROBIOTIC COLON SUPPORT) CAPS Take 1 capsule by mouth daily.     sertraline (ZOLOFT) 100 MG tablet Take 200 mg by mouth daily.      simvastatin (ZOCOR) 40 MG tablet TAKE ONE TABLET BY MOUTH AT BEDTIME 90 tablet 1   tadalafil, PAH, (ALYQ) 20 MG tablet Take 2 tablets every day by oral route.     traMADol (ULTRAM) 50 MG tablet Take 50 mg by mouth 2 (two) times daily.     trimethoprim-polymyxin b (POLYTRIM) ophthalmic solution INSTILL 1 DROP INTO AFFECTED EYE(S) BY OPHTHALMIC ROUTE EVERY 6 HOURS FOR 7 DAYS     warfarin (COUMADIN) 3 MG tablet Take 3 mg by mouth daily.     gabapentin (NEURONTIN) 600 MG tablet Take by mouth.     MYRBETRIQ 25 MG TB24 tablet TAKE ONE TABLET BY MOUTH ONCE DAILY 30 tablet 0   No facility-administered medications prior to visit.    Allergies  Allergen Reactions   Amoxicillin-Pot Clavulanate Other (See Comments)   Clarithromycin Other (See Comments)    Stomach problems   Erythromycin    Lisinopril Swelling   Amoxicillin Diarrhea    ROS Review of Systems  Constitutional:  Positive for fatigue. Negative for chills and fever.  Eyes:  Negative for visual disturbance.  Respiratory:  Negative for chest tightness and shortness of breath.   Neurological:  Positive for numbness. Negative for dizziness and headaches.      Objective:    Physical Exam HENT:     Head: Normocephalic.      Mouth/Throat:     Mouth: Mucous membranes are moist.  Cardiovascular:     Rate and Rhythm: Normal rate.     Heart sounds: Normal heart sounds.  Pulmonary:     Effort: Pulmonary effort is normal.     Breath sounds: Normal breath sounds.  Musculoskeletal:     Comments: Numbness in the left foot more than the right foot  Neurological:     Mental Status: She is alert.     BP 110/62   Pulse (!) 106   Ht 5\' 4"  (1.626 m)   Wt 158 lb 1.9 oz (71.7 kg)   SpO2 96%   BMI 27.14 kg/m  Wt Readings from Last 3 Encounters:  11/15/22 158 lb 1.9 oz (71.7 kg)  11/14/22 160 lb (72.6 kg)  11/09/22 166 lb 11.2  oz (75.6 kg)    Lab Results  Component Value Date   TSH 1.180 06/27/2022   Lab Results  Component Value Date   WBC 6.0 11/14/2022   HGB 13.4 11/14/2022   HCT 39.2 11/14/2022   MCV 100.3 (H) 11/14/2022   PLT 81 (L) 11/14/2022   Lab Results  Component Value Date   NA 132 (L) 11/14/2022   K 2.9 (L) 11/14/2022   CO2 23 11/14/2022   GLUCOSE 105 (H) 11/14/2022   BUN 20 11/14/2022   CREATININE 1.26 (H) 11/14/2022   BILITOT 1.8 (H) 11/14/2022   ALKPHOS 45 11/14/2022   AST 30 11/14/2022   ALT 30 11/14/2022   PROT 7.5 11/14/2022   ALBUMIN 4.9 11/14/2022   CALCIUM 9.2 11/14/2022   ANIONGAP 16 (H) 11/14/2022   EGFR 58 (L) 06/04/2021   Lab Results  Component Value Date   CHOL 146 06/27/2022   Lab Results  Component Value Date   HDL 65 06/27/2022   Lab Results  Component Value Date   LDLCALC 62 06/27/2022   Lab Results  Component Value Date   TRIG 102 06/27/2022   Lab Results  Component Value Date   CHOLHDL 2.2 06/27/2022   Lab Results  Component Value Date   HGBA1C 5.2 06/27/2022      Assessment & Plan:  Other fatigue Assessment & Plan: Encouraged to increased her fluid intake recommend avoiding heavy meals in the evening. Eat a well-balanced diet, which includes lean proteins, whole grains, plenty of fruits and vegetables, and low-fat dairy products. Avoid  eating or drinking too many products with caffeine in them.. Monitor your fatigue for any changes. Go to bed and get up at the same time every day. Avoid fatigue by pacing yourself during the day and getting enough sleep at night.   Urinary incontinence, unspecified type Assessment & Plan: Reports minimum relief with myrbetriq 25 mg daily Will increase the dose to 50 mg daily Encouraged to reduce the amount of liquid drank before bedtime and cut down on caffeine ,spicy or acidic foods that irritate the bladder  Orders: -     Mirabegron ER; Take 1 tablet (50 mg total) by mouth daily.  Dispense: 30 tablet; Refill: 0  Numbness and tingling of both feet Assessment & Plan: More prominent in the left  Encouraged to continue taking gabapentin 100 mg TID and to follow up with her neurologist if she should take gabapentin 600 mg daily.     Follow-up: Return in about 1 month (around 12/15/2022).   Gilmore Laroche, FNP

## 2022-11-15 NOTE — Assessment & Plan Note (Signed)
More prominent in the left  Encouraged to continue taking gabapentin 100 mg TID and to follow up with her neurologist if she should take gabapentin 600 mg daily.

## 2022-11-15 NOTE — Assessment & Plan Note (Signed)
Encouraged to increased her fluid intake recommend avoiding heavy meals in the evening. Eat a well-balanced diet, which includes lean proteins, whole grains, plenty of fruits and vegetables, and low-fat dairy products. Avoid eating or drinking too many products with caffeine in them.. Monitor your fatigue for any changes. Go to bed and get up at the same time every day. Avoid fatigue by pacing yourself during the day and getting enough sleep at night.

## 2022-11-15 NOTE — Assessment & Plan Note (Signed)
Reports minimum relief with myrbetriq 25 mg daily Will increase the dose to 50 mg daily Encouraged to reduce the amount of liquid drank before bedtime and cut down on caffeine ,spicy or acidic foods that irritate the bladder

## 2022-11-15 NOTE — Telephone Encounter (Signed)
Pt had appt today- opened in error   Woodfin Ganja LPN [facility] Nurse Health Advisor Direct Dial [phone]

## 2022-11-18 ENCOUNTER — Other Ambulatory Visit: Payer: Self-pay

## 2022-11-18 MED ORDER — LENALIDOMIDE 10 MG PO CAPS: ORAL_CAPSULE | ORAL | 0 refills | Status: DC

## 2022-11-18 NOTE — Telephone Encounter (Signed)
Chart reviewed. Revlimid refilled per last office note with Dr. Katragadda.  

## 2022-11-23 ENCOUNTER — Inpatient Hospital Stay: Payer: Medicare Other | Admitting: Hematology

## 2022-11-23 ENCOUNTER — Inpatient Hospital Stay: Payer: Medicare Other | Attending: Hematology

## 2022-11-23 ENCOUNTER — Inpatient Hospital Stay: Payer: Medicare Other

## 2022-11-23 VITALS — BP 108/68 | HR 84 | Temp 96.9°F | Resp 18 | Wt 161.2 lb

## 2022-11-23 DIAGNOSIS — Z5112 Encounter for antineoplastic immunotherapy: Secondary | ICD-10-CM | POA: Diagnosis not present

## 2022-11-23 DIAGNOSIS — C9000 Multiple myeloma not having achieved remission: Secondary | ICD-10-CM | POA: Diagnosis present

## 2022-11-23 LAB — CBC WITH DIFFERENTIAL/PLATELET
Abs Immature Granulocytes: 0.01 10*3/uL (ref 0.00–0.07)
Basophils Absolute: 0 10*3/uL (ref 0.0–0.1)
Basophils Relative: 1 %
Eosinophils Absolute: 0.3 10*3/uL (ref 0.0–0.5)
Eosinophils Relative: 9 %
HCT: 33.1 % — ABNORMAL LOW (ref 36.0–46.0)
Hemoglobin: 11.2 g/dL — ABNORMAL LOW (ref 12.0–15.0)
Immature Granulocytes: 0 %
Lymphocytes Relative: 26 %
Lymphs Abs: 0.9 10*3/uL (ref 0.7–4.0)
MCH: 34.6 pg — ABNORMAL HIGH (ref 26.0–34.0)
MCHC: 33.8 g/dL (ref 30.0–36.0)
MCV: 102.2 fL — ABNORMAL HIGH (ref 80.0–100.0)
Monocytes Absolute: 0.6 10*3/uL (ref 0.1–1.0)
Monocytes Relative: 15 %
Neutro Abs: 1.7 10*3/uL (ref 1.7–7.7)
Neutrophils Relative %: 49 %
Platelets: 90 10*3/uL — ABNORMAL LOW (ref 150–400)
RBC: 3.24 MIL/uL — ABNORMAL LOW (ref 3.87–5.11)
RDW: 16.9 % — ABNORMAL HIGH (ref 11.5–15.5)
WBC: 3.6 10*3/uL — ABNORMAL LOW (ref 4.0–10.5)
nRBC: 0 % (ref 0.0–0.2)

## 2022-11-23 LAB — COMPREHENSIVE METABOLIC PANEL
ALT: 22 U/L (ref 0–44)
AST: 22 U/L (ref 15–41)
Albumin: 3.8 g/dL (ref 3.5–5.0)
Alkaline Phosphatase: 44 U/L (ref 38–126)
Anion gap: 8 (ref 5–15)
BUN: 22 mg/dL (ref 8–23)
CO2: 27 mmol/L (ref 22–32)
Calcium: 7.8 mg/dL — ABNORMAL LOW (ref 8.9–10.3)
Chloride: 103 mmol/L (ref 98–111)
Creatinine, Ser: 0.95 mg/dL (ref 0.44–1.00)
GFR, Estimated: >60 mL/min (ref >60)
Glucose, Bld: 88 mg/dL (ref 70–99)
Potassium: 3.1 mmol/L — ABNORMAL LOW (ref 3.5–5.1)
Sodium: 138 mmol/L (ref 135–145)
Total Bilirubin: 0.8 mg/dL (ref 0.3–1.2)
Total Protein: 6.2 g/dL — ABNORMAL LOW (ref 6.5–8.1)

## 2022-11-23 MED ORDER — PROCHLORPERAZINE MALEATE 10 MG PO TABS
10.0000 mg | ORAL_TABLET | Freq: Four times a day (QID) | ORAL | Status: DC | PRN
  Administered 2022-11-23: 10 mg via ORAL
  Filled 2022-11-23: qty 1

## 2022-11-23 MED ORDER — BORTEZOMIB CHEMO SQ INJECTION 3.5 MG (2.5MG/ML)
1.0000 mg/m2 | Freq: Once | INTRAMUSCULAR | Status: AC
  Administered 2022-11-23: 1.75 mg via SUBCUTANEOUS
  Filled 2022-11-23: qty 0.7

## 2022-11-23 MED ORDER — DEXAMETHASONE 4 MG PO TABS
20.0000 mg | ORAL_TABLET | Freq: Once | ORAL | Status: AC
  Administered 2022-11-23: 20 mg via ORAL
  Filled 2022-11-23: qty 5

## 2022-11-23 MED ORDER — POTASSIUM CHLORIDE CRYS ER 20 MEQ PO TBCR
40.0000 meq | EXTENDED_RELEASE_TABLET | Freq: Once | ORAL | Status: AC
  Administered 2022-11-23: 40 meq via ORAL
  Filled 2022-11-23: qty 2

## 2022-11-23 NOTE — Progress Notes (Signed)
Patient presents today for Velcade infusion. Patient is in satisfactory condition with no new complaints voiced.  Vital signs are stable.  Labs reviewed and all labs are within treatment parameters. Pt's platelets are 90 today per Dr.K's standing order, pt is okay to proceed with treatment today. Pt will receive 40 mEq potassium chloride p.o x 1 dose. We will proceed with treatment per MD orders.    Treatment given today per MD orders. Tolerated infusion without adverse affects. Vital signs stable. No complaints at this time. Discharged from clinic via wheelchair in stable condition. Alert and oriented x 3. F/U with [facility] as scheduled.

## 2022-11-23 NOTE — Patient Instructions (Signed)
[facility]  Discharge Instructions: Thank you for choosing [facility] to provide your oncology and hematology care.  If you have a lab appointment with the Cancer Center - please note that after April 8th, 2024, all labs will be drawn in the cancer center.  You do not have to check in or register with the main entrance as you have in the past but will complete your check-in in the cancer center.  Wear comfortable clothing and clothing appropriate for easy access to any Portacath or PICC line.   We strive to give you quality time with your provider. You may need to reschedule your appointment if you arrive late (15 or more minutes).  Arriving late affects you and other patients whose appointments are after yours.  Also, if you miss three or more appointments without notifying the office, you may be dismissed from the clinic at the provider's discretion.      For prescription refill requests, have your pharmacy contact our office and allow 72 hours for refills to be completed.    Today you received the following chemotherapy and/or immunotherapy agents Velcade   To help prevent nausea and vomiting after your treatment, we encourage you to take your nausea medication as directed.  Bortezomib Injection What is this medication? BORTEZOMIB (bor TEZ oh mib) treats lymphoma. It may also be used to treat multiple myeloma, a type of bone marrow cancer. It works by blocking a protein that causes cancer cells to grow and multiply. This helps to slow or stop the spread of cancer cells. This medicine may be used for other purposes; ask your health care provider or pharmacist if you have questions. COMMON BRAND NAME(S): Velcade What should I tell my care team before I take this medication? They need to know if you have any of these conditions: Dehydration Diabetes Heart disease Liver disease Tingling of the fingers or toes or other nerve disorder An unusual or  allergic reaction to bortezomib, other medications, foods, dyes, or preservatives If you or your partner are pregnant or trying to get pregnant Breastfeeding How should I use this medication? This medication is injected into a vein or under the skin. It is given by your care team in a hospital or clinic setting. Talk to your care team about the use of this medication in children. Special care may be needed. Overdosage: If you think you have taken too much of this medicine contact a poison control center or emergency room at once. NOTE: This medicine is only for you. Do not share this medicine with others. What if I miss a dose? Keep appointments for follow-up doses. It is important not to miss your dose. Call your care team if you are unable to keep an appointment. What may interact with this medication? Ketoconazole Rifampin This list may not describe all possible interactions. Give your health care provider a list of all the medicines, herbs, non-prescription drugs, or dietary supplements you use. Also tell them if you smoke, drink alcohol, or use illegal drugs. Some items may interact with your medicine. What should I watch for while using this medication? Your condition will be monitored carefully while you are receiving this medication. You may need blood work while taking this medication. This medication may affect your coordination, reaction time, or judgment. Do not drive or operate machinery until you know how this medication affects you. Sit up or stand slowly to reduce the risk of dizzy or fainting spells. Drinking alcohol   with this medication can increase the risk of these side effects. This medication may increase your risk of getting an infection. Call your care team for advice if you get a fever, chills, sore throat, or other symptoms of a cold or flu. Do not treat yourself. Try to avoid being around people who are sick. Check with your care team if you have severe diarrhea, nausea,  and vomiting, or if you sweat a lot. The loss of too much body fluid may make it dangerous for you to take this medication. Talk to your care team if you may be pregnant. Serious birth defects can occur if you take this medication during pregnancy and for 7 months after the last dose. You will need a negative pregnancy test before starting this medication. Contraception is recommended while taking this medication and for 7 months after the last dose. Your care team can help you find the option that works for you. If your partner can get pregnant, use a condom during sex while taking this medication and for 4 months after the last dose. Do not breastfeed while taking this medication and for 2 months after the last dose. This medication may cause infertility. Talk to your care team if you are concerned about your fertility. What side effects may I notice from receiving this medication? Side effects that you should report to your care team as soon as possible: Allergic reactions--skin rash, itching, hives, swelling of the face, lips, tongue, or throat Bleeding--bloody or black, tar-like stools, vomiting blood or brown material that looks like coffee grounds, red or dark brown urine, small red or purple spots on skin, unusual bruising or bleeding Bleeding in the brain--severe headache, stiff neck, confusion, dizziness, change in vision, numbness or weakness of the face, arm, or leg, trouble speaking, trouble walking, vomiting Bowel blockage--stomach cramping, unable to have a bowel movement or pass gas, loss of appetite, vomiting Heart failure--shortness of breath, swelling of the ankles, feet, or hands, sudden weight gain, unusual weakness or fatigue Infection--fever, chills, cough, sore throat, wounds that don't heal, pain or trouble when passing urine, general feeling of discomfort or being unwell Liver injury--right upper belly pain, loss of appetite, nausea, light-colored stool, dark yellow or brown  urine, yellowing skin or eyes, unusual weakness or fatigue Low blood pressure--dizziness, feeling faint or lightheaded, blurry vision Lung injury--shortness of breath or trouble breathing, cough, spitting up blood, chest pain, fever Pain, tingling, or numbness in the hands or feet Severe or prolonged diarrhea Stomach pain, bloody diarrhea, pale skin, unusual weakness or fatigue, decrease in the amount of urine, which may be signs of hemolytic uremic syndrome Sudden and severe headache, confusion, change in vision, seizures, which may be signs of posterior reversible encephalopathy syndrome (PRES) TTP--purple spots on the skin or inside the mouth, pale skin, yellowing skin or eyes, unusual weakness or fatigue, fever, fast or irregular heartbeat, confusion, change in vision, trouble speaking, trouble walking Tumor lysis syndrome (TLS)--nausea, vomiting, diarrhea, decrease in the amount of urine, dark urine, unusual weakness or fatigue, confusion, muscle pain or cramps, fast or irregular heartbeat, joint pain Side effects that usually do not require medical attention (report to your care team if they continue or are bothersome): Constipation Diarrhea Fatigue Loss of appetite Nausea This list may not describe all possible side effects. Call your doctor for medical advice about side effects. You may report side effects to FDA at 1-800-FDA-1088. Where should I keep my medication? This medication is given in a hospital or   clinic. It will not be stored at home. NOTE: This sheet is a summary. It may not cover all possible information. If you have questions about this medicine, talk to your doctor, pharmacist, or health care provider.  2024 Elsevier/Gold Standard (2021-10-12 00:00:00)   BELOW ARE SYMPTOMS THAT SHOULD BE REPORTED IMMEDIATELY: *FEVER GREATER THAN 100.4 F (38 C) OR HIGHER *CHILLS OR SWEATING *NAUSEA AND VOMITING THAT IS NOT CONTROLLED WITH YOUR NAUSEA MEDICATION *UNUSUAL SHORTNESS OF  BREATH *UNUSUAL BRUISING OR BLEEDING *URINARY PROBLEMS (pain or burning when urinating, or frequent urination) *BOWEL PROBLEMS (unusual diarrhea, constipation, pain near the anus) TENDERNESS IN MOUTH AND THROAT WITH OR WITHOUT PRESENCE OF ULCERS (sore throat, sores in mouth, or a toothache) UNUSUAL RASH, SWELLING OR PAIN  UNUSUAL VAGINAL DISCHARGE OR ITCHING   Items with * indicate a potential emergency and should be followed up as soon as possible or go to the Emergency Department if any problems should occur.  Please show the CHEMOTHERAPY ALERT CARD or IMMUNOTHERAPY ALERT CARD at check-in to the Emergency Department and triage nurse.  Should you have questions after your visit or need to cancel or reschedule your appointment, please contact [facility] [phone]  and follow the prompts.  Office hours are 8:00 a.m. to 4:30 p.m. Monday - Friday. Please note that voicemails left after 4:00 p.m. may not be returned until the following business day.  We are closed weekends and major holidays. You have access to a nurse at all times for urgent questions. Please call the main number to the clinic [phone] and follow the prompts.  For any non-urgent questions, you may also contact your provider using MyChart. We now offer e-Visits for anyone 18 and older to request care online for non-urgent symptoms. For details visit [email or web address].   Also download the MyChart app! Go to the app store, search "MyChart", open the app, select [facility], and log in with your MyChart username and password.   

## 2022-11-28 ENCOUNTER — Other Ambulatory Visit: Payer: Self-pay | Admitting: Family Medicine

## 2022-11-28 DIAGNOSIS — J301 Allergic rhinitis due to pollen: Secondary | ICD-10-CM

## 2022-12-02 ENCOUNTER — Encounter: Payer: Self-pay | Admitting: Hematology

## 2022-12-06 ENCOUNTER — Other Ambulatory Visit: Payer: Self-pay

## 2022-12-06 DIAGNOSIS — C9000 Multiple myeloma not having achieved remission: Secondary | ICD-10-CM

## 2022-12-07 ENCOUNTER — Inpatient Hospital Stay: Payer: Medicare Other

## 2022-12-07 VITALS — BP 110/70 | HR 98 | Temp 98.0°F | Resp 18 | Ht 64.0 in | Wt 160.4 lb

## 2022-12-07 DIAGNOSIS — C9000 Multiple myeloma not having achieved remission: Secondary | ICD-10-CM

## 2022-12-07 DIAGNOSIS — Z5112 Encounter for antineoplastic immunotherapy: Secondary | ICD-10-CM | POA: Diagnosis not present

## 2022-12-07 LAB — CBC WITH DIFFERENTIAL/PLATELET
Abs Immature Granulocytes: 0.01 10*3/uL (ref 0.00–0.07)
Basophils Absolute: 0.1 10*3/uL (ref 0.0–0.1)
Basophils Relative: 1 %
Eosinophils Absolute: 0.2 10*3/uL (ref 0.0–0.5)
Eosinophils Relative: 6 %
HCT: 35.5 % — ABNORMAL LOW (ref 36.0–46.0)
Hemoglobin: 11.6 g/dL — ABNORMAL LOW (ref 12.0–15.0)
Immature Granulocytes: 0 %
Lymphocytes Relative: 26 %
Lymphs Abs: 0.9 10*3/uL (ref 0.7–4.0)
MCH: 34 pg (ref 26.0–34.0)
MCHC: 32.7 g/dL (ref 30.0–36.0)
MCV: 104.1 fL — ABNORMAL HIGH (ref 80.0–100.0)
Monocytes Absolute: 0.3 10*3/uL (ref 0.1–1.0)
Monocytes Relative: 8 %
Neutro Abs: 2.1 10*3/uL (ref 1.7–7.7)
Neutrophils Relative %: 59 %
Platelets: 107 10*3/uL — ABNORMAL LOW (ref 150–400)
RBC: 3.41 MIL/uL — ABNORMAL LOW (ref 3.87–5.11)
RDW: 17.5 % — ABNORMAL HIGH (ref 11.5–15.5)
WBC: 3.6 10*3/uL — ABNORMAL LOW (ref 4.0–10.5)
nRBC: 0 % (ref 0.0–0.2)

## 2022-12-07 LAB — COMPREHENSIVE METABOLIC PANEL
ALT: 25 U/L (ref 0–44)
AST: 25 U/L (ref 15–41)
Albumin: 4.1 g/dL (ref 3.5–5.0)
Alkaline Phosphatase: 53 U/L (ref 38–126)
Anion gap: 7 (ref 5–15)
BUN: 15 mg/dL (ref 8–23)
CO2: 29 mmol/L (ref 22–32)
Calcium: 9.2 mg/dL (ref 8.9–10.3)
Chloride: 105 mmol/L (ref 98–111)
Creatinine, Ser: 0.85 mg/dL (ref 0.44–1.00)
GFR, Estimated: >60 mL/min (ref >60)
Glucose, Bld: 102 mg/dL — ABNORMAL HIGH (ref 70–99)
Potassium: 3.6 mmol/L (ref 3.5–5.1)
Sodium: 141 mmol/L (ref 135–145)
Total Bilirubin: 0.6 mg/dL (ref 0.3–1.2)
Total Protein: 6.6 g/dL (ref 6.5–8.1)

## 2022-12-07 LAB — MAGNESIUM: Magnesium: 1.8 mg/dL (ref 1.7–2.4)

## 2022-12-07 MED ORDER — BORTEZOMIB CHEMO SQ INJECTION 3.5 MG (2.5MG/ML)
1.0000 mg/m2 | Freq: Once | INTRAMUSCULAR | Status: AC
  Administered 2022-12-07: 1.75 mg via SUBCUTANEOUS
  Filled 2022-12-07: qty 0.7

## 2022-12-07 MED ORDER — PROCHLORPERAZINE MALEATE 10 MG PO TABS
10.0000 mg | ORAL_TABLET | Freq: Once | ORAL | Status: AC
  Administered 2022-12-07: 10 mg via ORAL
  Filled 2022-12-07: qty 1

## 2022-12-07 NOTE — Progress Notes (Signed)
 Velcade injection given today per MD orders. Tolerated without adverse affects. Vital signs stable. No complaints at this time. Discharged from clinic ambulatory in stable condition. Alert and oriented x 3. F/U with [facility] as scheduled.

## 2022-12-07 NOTE — Progress Notes (Signed)
Patient presents today for Velcade infusion. Patient is in satisfactory condition with no new complaints voiced.  Vital signs are stable.  Labs reviewed and all labs are within treatment parameters.  We will proceed with treatment per MD orders.

## 2022-12-07 NOTE — Patient Instructions (Signed)
[facility]  Discharge Instructions: Thank you for choosing [facility] to provide your oncology and hematology care.  If you have a lab appointment with the Cancer Center - please note that after April 8th, 2024, all labs will be drawn in the cancer center.  You do not have to check in or register with the main entrance as you have in the past but will complete your check-in in the cancer center.  Wear comfortable clothing and clothing appropriate for easy access to any Portacath or PICC line.   We strive to give you quality time with your provider. You may need to reschedule your appointment if you arrive late (15 or more minutes).  Arriving late affects you and other patients whose appointments are after yours.  Also, if you miss three or more appointments without notifying the office, you may be dismissed from the clinic at the provider's discretion.      For prescription refill requests, have your pharmacy contact our office and allow 72 hours for refills to be completed.    Today you received the following chemotherapy and/or immunotherapy agents Velcade injection.  Bortezomib Injection What is this medication? BORTEZOMIB (bor TEZ oh mib) treats lymphoma. It may also be used to treat multiple myeloma, a type of bone marrow cancer. It works by blocking a protein that causes cancer cells to grow and multiply. This helps to slow or stop the spread of cancer cells. This medicine may be used for other purposes; ask your health care provider or pharmacist if you have questions. COMMON BRAND NAME(S): Velcade What should I tell my care team before I take this medication? They need to know if you have any of these conditions: Dehydration Diabetes Heart disease Liver disease Tingling of the fingers or toes or other nerve disorder An unusual or allergic reaction to bortezomib, other medications, foods, dyes, or preservatives If you or your partner are pregnant  or trying to get pregnant Breastfeeding How should I use this medication? This medication is injected into a vein or under the skin. It is given by your care team in a hospital or clinic setting. Talk to your care team about the use of this medication in children. Special care may be needed. Overdosage: If you think you have taken too much of this medicine contact a poison control center or emergency room at once. NOTE: This medicine is only for you. Do not share this medicine with others. What if I miss a dose? Keep appointments for follow-up doses. It is important not to miss your dose. Call your care team if you are unable to keep an appointment. What may interact with this medication? Ketoconazole Rifampin This list may not describe all possible interactions. Give your health care provider a list of all the medicines, herbs, non-prescription drugs, or dietary supplements you use. Also tell them if you smoke, drink alcohol, or use illegal drugs. Some items may interact with your medicine. What should I watch for while using this medication? Your condition will be monitored carefully while you are receiving this medication. You may need blood work while taking this medication. This medication may affect your coordination, reaction time, or judgment. Do not drive or operate machinery until you know how this medication affects you. Sit up or stand slowly to reduce the risk of dizzy or fainting spells. Drinking alcohol with this medication can increase the risk of these side effects. This medication may increase your risk of getting an   infection. Call your care team for advice if you get a fever, chills, sore throat, or other symptoms of a cold or flu. Do not treat yourself. Try to avoid being around people who are sick. Check with your care team if you have severe diarrhea, nausea, and vomiting, or if you sweat a lot. The loss of too much body fluid may make it dangerous for you to take this  medication. Talk to your care team if you may be pregnant. Serious birth defects can occur if you take this medication during pregnancy and for 7 months after the last dose. You will need a negative pregnancy test before starting this medication. Contraception is recommended while taking this medication and for 7 months after the last dose. Your care team can help you find the option that works for you. If your partner can get pregnant, use a condom during sex while taking this medication and for 4 months after the last dose. Do not breastfeed while taking this medication and for 2 months after the last dose. This medication may cause infertility. Talk to your care team if you are concerned about your fertility. What side effects may I notice from receiving this medication? Side effects that you should report to your care team as soon as possible: Allergic reactions--skin rash, itching, hives, swelling of the face, lips, tongue, or throat Bleeding--bloody or black, tar-like stools, vomiting blood or brown material that looks like coffee grounds, red or dark brown urine, small red or purple spots on skin, unusual bruising or bleeding Bleeding in the brain--severe headache, stiff neck, confusion, dizziness, change in vision, numbness or weakness of the face, arm, or leg, trouble speaking, trouble walking, vomiting Bowel blockage--stomach cramping, unable to have a bowel movement or pass gas, loss of appetite, vomiting Heart failure--shortness of breath, swelling of the ankles, feet, or hands, sudden weight gain, unusual weakness or fatigue Infection--fever, chills, cough, sore throat, wounds that don't heal, pain or trouble when passing urine, general feeling of discomfort or being unwell Liver injury--right upper belly pain, loss of appetite, nausea, light-colored stool, dark yellow or brown urine, yellowing skin or eyes, unusual weakness or fatigue Low blood pressure--dizziness, feeling faint or  lightheaded, blurry vision Lung injury--shortness of breath or trouble breathing, cough, spitting up blood, chest pain, fever Pain, tingling, or numbness in the hands or feet Severe or prolonged diarrhea Stomach pain, bloody diarrhea, pale skin, unusual weakness or fatigue, decrease in the amount of urine, which may be signs of hemolytic uremic syndrome Sudden and severe headache, confusion, change in vision, seizures, which may be signs of posterior reversible encephalopathy syndrome (PRES) TTP--purple spots on the skin or inside the mouth, pale skin, yellowing skin or eyes, unusual weakness or fatigue, fever, fast or irregular heartbeat, confusion, change in vision, trouble speaking, trouble walking Tumor lysis syndrome (TLS)--nausea, vomiting, diarrhea, decrease in the amount of urine, dark urine, unusual weakness or fatigue, confusion, muscle pain or cramps, fast or irregular heartbeat, joint pain Side effects that usually do not require medical attention (report to your care team if they continue or are bothersome): Constipation Diarrhea Fatigue Loss of appetite Nausea This list may not describe all possible side effects. Call your doctor for medical advice about side effects. You may report side effects to FDA at 1-800-FDA-1088. Where should I keep my medication? This medication is given in a hospital or clinic. It will not be stored at home. NOTE: This sheet is a summary. It may not cover all possible   information. If you have questions about this medicine, talk to your doctor, pharmacist, or health care provider.  2024 Elsevier/Gold Standard (2021-10-12 00:00:00)       To help prevent nausea and vomiting after your treatment, we encourage you to take your nausea medication as directed.  BELOW ARE SYMPTOMS THAT SHOULD BE REPORTED IMMEDIATELY: *FEVER GREATER THAN 100.4 F (38 C) OR HIGHER *CHILLS OR SWEATING *NAUSEA AND VOMITING THAT IS NOT CONTROLLED WITH YOUR NAUSEA  MEDICATION *UNUSUAL SHORTNESS OF BREATH *UNUSUAL BRUISING OR BLEEDING *URINARY PROBLEMS (pain or burning when urinating, or frequent urination) *BOWEL PROBLEMS (unusual diarrhea, constipation, pain near the anus) TENDERNESS IN MOUTH AND THROAT WITH OR WITHOUT PRESENCE OF ULCERS (sore throat, sores in mouth, or a toothache) UNUSUAL RASH, SWELLING OR PAIN  UNUSUAL VAGINAL DISCHARGE OR ITCHING   Items with * indicate a potential emergency and should be followed up as soon as possible or go to the Emergency Department if any problems should occur.  Please show the CHEMOTHERAPY ALERT CARD or IMMUNOTHERAPY ALERT CARD at check-in to the Emergency Department and triage nurse.  Should you have questions after your visit or need to cancel or reschedule your appointment, please contact [facility] [phone]  and follow the prompts.  Office hours are 8:00 a.m. to 4:30 p.m. Monday - Friday. Please note that voicemails left after 4:00 p.m. may not be returned until the following business day.  We are closed weekends and major holidays. You have access to a nurse at all times for urgent questions. Please call the main number to the clinic [phone] and follow the prompts.  For any non-urgent questions, you may also contact your provider using MyChart. We now offer e-Visits for anyone 18 and older to request care online for non-urgent symptoms. For details visit [email or web address].   Also download the MyChart app! Go to the app store, search "MyChart", open the app, select [facility], and log in with your MyChart username and password.   

## 2022-12-08 LAB — KAPPA/LAMBDA LIGHT CHAINS
Kappa free light chain: 10.6 mg/L (ref 3.3–19.4)
Kappa, lambda light chain ratio: 0.63 (ref 0.26–1.65)
Lambda free light chains: 16.9 mg/L (ref 5.7–26.3)

## 2022-12-09 LAB — PROTEIN ELECTROPHORESIS, SERUM
A/G Ratio: 1.8 — ABNORMAL HIGH (ref 0.7–1.7)
Albumin ELP: 3.9 g/dL (ref 2.9–4.4)
Alpha-1-Globulin: 0.3 g/dL (ref 0.0–0.4)
Alpha-2-Globulin: 0.6 g/dL (ref 0.4–1.0)
Beta Globulin: 1 g/dL (ref 0.7–1.3)
Gamma Globulin: 0.4 g/dL (ref 0.4–1.8)
Globulin, Total: 2.2 g/dL (ref 2.2–3.9)
Total Protein ELP: 6.1 g/dL (ref 6.0–8.5)

## 2022-12-10 ENCOUNTER — Emergency Department (HOSPITAL_COMMUNITY): Payer: Medicare Other

## 2022-12-10 ENCOUNTER — Other Ambulatory Visit: Payer: Self-pay

## 2022-12-10 ENCOUNTER — Encounter (HOSPITAL_COMMUNITY): Payer: Self-pay | Admitting: Emergency Medicine

## 2022-12-10 ENCOUNTER — Emergency Department (HOSPITAL_COMMUNITY)
Admission: EM | Admit: 2022-12-10 | Discharge: 2022-12-10 | Disposition: A | Discharge status: Home / Self Care | Payer: Medicare Other | Source: Home / Self Care | Attending: Emergency Medicine | Admitting: Emergency Medicine

## 2022-12-10 DIAGNOSIS — Z7901 Long term (current) use of anticoagulants: Secondary | ICD-10-CM | POA: Diagnosis not present

## 2022-12-10 DIAGNOSIS — Y9301 Activity, walking, marching and hiking: Secondary | ICD-10-CM | POA: Diagnosis not present

## 2022-12-10 DIAGNOSIS — M25551 Pain in right hip: Secondary | ICD-10-CM | POA: Insufficient documentation

## 2022-12-10 DIAGNOSIS — G8929 Other chronic pain: Secondary | ICD-10-CM | POA: Insufficient documentation

## 2022-12-10 DIAGNOSIS — Z79899 Other long term (current) drug therapy: Secondary | ICD-10-CM | POA: Insufficient documentation

## 2022-12-10 DIAGNOSIS — Z7982 Long term (current) use of aspirin: Secondary | ICD-10-CM | POA: Diagnosis not present

## 2022-12-10 DIAGNOSIS — W010XXA Fall on same level from slipping, tripping and stumbling without subsequent striking against object, initial encounter: Secondary | ICD-10-CM | POA: Insufficient documentation

## 2022-12-10 DIAGNOSIS — W19XXXA Unspecified fall, initial encounter: Secondary | ICD-10-CM

## 2022-12-10 DIAGNOSIS — I10 Essential (primary) hypertension: Secondary | ICD-10-CM | POA: Diagnosis not present

## 2022-12-10 DIAGNOSIS — M25561 Pain in right knee: Secondary | ICD-10-CM | POA: Diagnosis not present

## 2022-12-10 DIAGNOSIS — Z96651 Presence of right artificial knee joint: Secondary | ICD-10-CM | POA: Insufficient documentation

## 2022-12-10 DIAGNOSIS — Y92009 Unspecified place in unspecified non-institutional (private) residence as the place of occurrence of the external cause: Secondary | ICD-10-CM | POA: Diagnosis not present

## 2022-12-10 DIAGNOSIS — M545 Low back pain, unspecified: Secondary | ICD-10-CM | POA: Diagnosis not present

## 2022-12-10 MED ORDER — HYDROCODONE-ACETAMINOPHEN 5-325 MG PO TABS
1.0000 | ORAL_TABLET | Freq: Once | ORAL | Status: AC
  Administered 2022-12-10: 1 via ORAL
  Filled 2022-12-10: qty 1

## 2022-12-10 NOTE — ED Notes (Signed)
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  Denies numbness in feet. Also complains of Lumbar pain

## 2022-12-10 NOTE — ED Provider Notes (Signed)
[place] [facility] AT [facility] Provider Note   CSN: [id number] Arrival date & time: 12/10/22  1025     History  Chief Complaint  Patient presents with   Leg Pain    Courtney Rogers is a 72 y.o. female.  With a history of anxiety, hypertension, hyperlipidemia, previous PE on Coumadin who presents to the ED for evaluation of a fall.  This occurred 2 weeks ago.  She states she was walking in her house with her walker and tripped over the wheel of her walker.  She landed on her right hip.  Does not believe she hit her head.  Did not lose consciousness.  She reports compliance with her Coumadin.  She has had right knee pain, right hip pain and right sided low back pain since the incident.  She was taking hydrocodone with some improvement up until a week ago.  She stopped taking this hydrocodone because she wanted to "tough it out."  She denies any numbness, weakness or tingling.  No headaches or vision changes.  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  She denies any urinary or fecal incontinence.  No saddle paresthesias.  No fevers or chills.  No history of injection drug use.  She is ambulatory but states that ambulation makes the pain worse.  She denies any calf pain or swelling.   Leg Pain      Home Medications Prior to Admission medications   Medication Sig Start Date End Date Taking? Authorizing Provider  acetaminophen (TYLENOL) 500 MG tablet Take 1,000 mg by mouth 2 (two) times daily as needed for moderate pain or headache.    [provider]  acyclovir (ZOVIRAX) 400 MG tablet TAKE 1 TABLET BY MOUTH TWICE DAILY 04/19/22   Doreatha Massed, MD  albuterol (VENTOLIN HFA) 108 (90 Base) MCG/ACT inhaler USE 2 PUFFS ONE TO TWO TIMES A DAY. 05/11/22   Gilmore Laroche, FNP  amLODipine (NORVASC) 10 MG tablet TAKE ONE TABLET BY MOUTH ONCE DAILY 10/18/22   Doreatha Massed, MD  ascorbic acid (VITAMIN C) 500 MG tablet Take 500 mg by mouth daily. 02/07/22    [provider]  aspirin EC 81 MG tablet Take 81 mg by mouth at bedtime.    [provider]  B Complex Vitamins (VITAMIN B COMPLEX) TABS Take 1 tablet by mouth daily.    [provider]  B Complex-Folic Acid (B COMPLEX VITAMINS, W/ FA,) CAPS Take 1 capsule by mouth daily. 09/09/21   [provider]  bortezomib SQ (VELCADE) 3.5 MG injection Inject into the skin once. 08/10/21   [provider]  busPIRone (BUSPAR) 5 MG tablet Take 1 tablet (5 mg total) by mouth daily. 09/23/21   [name], FNP  Calcium Carbonate-Vitamin D (CALCIUM 600+D PO) Take 1 tablet by mouth 2 (two) times daily.    [provider]  chlorthalidone (HYGROTON) 25 MG tablet Take 25 mg by mouth daily. 06/04/22   [provider]  cholecalciferol (VITAMIN D3) 10 MCG (400 UNIT) TABS tablet Take 400 Units by mouth daily. 09/30/22   [provider]  cholecalciferol 25 MCG (1000 UT) tablet Take by mouth. 10/15/20   [provider]  cyanocobalamin (VITAMIN B12) 1000 MCG tablet Take by mouth. 10/12/20   [provider]  cyclobenzaprine (FLEXERIL) 10 MG tablet TAKE ONE-HALF TABLET BY MOUTH TWICE DAILY AS NEEDED FOR MUSCLE SPASMS 09/12/22   Gilmore Laroche, FNP  Daratumumab-Hyaluronidase-fihj (DARZALEX FASPRO [place]) Inject into the skin.  08/10/21   [provider]  diclofenac Sodium (VOLTAREN) 1 % GEL Apply 4 g topically 4 (four) times daily. 11/19/21   Gilmore Laroche, FNP  doxycycline (VIBRAMYCIN) 100 MG capsule Take 100 mg by mouth daily. 02/25/22   [provider]  fluticasone (FLONASE) 50 MCG/ACT nasal spray USE 2 SPRAYS IN EACH NOSTRIL ONCE DAILY 11/28/22   Gilmore Laroche, FNP  fluticasone-salmeterol (ADVAIR) 250-50 MCG/ACT AEPB Inhale into the lungs. 10/12/20   [provider]  folic acid (FOLVITE) 1 MG tablet TAKE 1 TABLET BY MOUTH ONCE DAILY. 03/15/22   Doreatha Massed, MD  gabapentin (NEURONTIN) 100 MG capsule Take 100  mg by mouth 3 (three) times daily. 05/07/19   [provider]  hydrochlorothiazide (MICROZIDE) 12.5 MG capsule Take 12.5 mg by mouth daily. 11/04/21   [provider]  HYDROcodone-acetaminophen (NORCO/VICODIN) 5-325 MG tablet TAKE ONE TABLET BY MOUTH EVERY 8 HOURS AS NEEDED FOR MODERATE PAIN 10/10/22   Doreatha Massed, MD  Hyoscyamine Sulfate SL 0.125 MG SUBL     [provider]  lenalidomide (REVLIMID) 10 MG capsule Take 1 capsule (10 mg total) by mouth daily. 21 days on, 7 days off every 28 days 11/18/22   Doreatha Massed, MD  levocetirizine (XYZAL) 5 MG tablet TAKE ONE TABLET BY MOUTH IN THE EVENING 10/18/22   Gilmore Laroche, FNP  lidocaine (HM LIDOCAINE PATCH) 4 % Place 1 patch onto the skin daily. 03/21/22   Rondel Baton, MD  lidocaine (XYLOCAINE) 2 % solution Take 2 teaspoons before meals and at bedtime as needed. May repeat every 4 hours for a maximum dose of 8 per day. 05/10/22   Tiffany Kocher, PA-C  Lidocaine HCl (XOLIDO) 2 % CREA Apply 1 application topically daily as needed (arthritisis).    [provider]  loperamide (IMODIUM) 2 MG capsule Take 4 mg by mouth as needed. Take 2 tablets after the first loose stool and then 1 tablet after each loose stool.  Do not exceed 8 capsules within 24 hours.    [provider]  losartan (COZAAR) 100 MG tablet  01/16/21   [provider]  magnesium oxide (MAG-OX) 400 (240 Mg) MG tablet Take 1 tablet (400 mg total) by mouth 2 (two) times daily. 10/03/22   Doreatha Massed, MD  metoprolol succinate (TOPROL-XL) 50 MG 24 hr tablet  09/09/21   [provider]  metoprolol tartrate (LOPRESSOR) 50 MG tablet Take 1 tablet (50 mg total) by mouth 2 (two) times daily. Patient taking differently: Take 25 mg by mouth 2 (two) times daily. 10/07/21   Paseda, Baird Kay, FNP  mirabegron ER (MYRBETRIQ) 50 MG TB24 tablet Take 1 tablet (50 mg total) by mouth daily. 11/15/22   Gilmore Laroche, FNP   mirtazapine (REMERON) 15 MG tablet Take 15 mg by mouth at bedtime. 01/12/22   [provider]  montelukast (SINGULAIR) 10 MG tablet Take 10 mg by mouth daily.     [provider]  montelukast (SINGULAIR) 10 MG tablet Take 1 tablet (10 mg total) by mouth at bedtime. 09/30/22   Gilmore Laroche, FNP  nitroGLYCERIN (NITROSTAT) 0.4 MG SL tablet Place 0.4 mg under the tongue every 5 (five) minutes as needed for chest pain.    [provider]  pantoprazole (PROTONIX) 40 MG tablet Take 40 mg by mouth daily. 08/10/20   [provider]  potassium chloride (KLOR-CON M) 10 MEQ tablet  09/09/21   [provider]  potassium chloride SA (KLOR-CON M) 20 MEQ  tablet Take 1 tablet (20 mEq total) by mouth 3 (three) times daily. 09/27/21   Dione Booze, MD  primidone (MYSOLINE) 50 MG tablet Take 75 mg by mouth daily. 03/31/21   [provider]  primidone (MYSOLINE) 50 MG tablet Take by mouth. 03/31/21   [provider]  Probiotic Product (GNP PROBIOTIC COLON SUPPORT) CAPS Take 1 capsule by mouth daily. 07/08/21   [provider]  sertraline (ZOLOFT) 100 MG tablet Take 200 mg by mouth daily.     [provider]  simvastatin (ZOCOR) 40 MG tablet TAKE ONE TABLET BY MOUTH AT BEDTIME 10/24/22   Gilmore Laroche, FNP  tadalafil, PAH, (ALYQ) 20 MG tablet Take 2 tablets every day by oral route.    [provider]  traMADol (ULTRAM) 50 MG tablet Take 50 mg by mouth 2 (two) times daily. 03/08/22   [provider]  trimethoprim-polymyxin b (POLYTRIM) ophthalmic solution INSTILL 1 DROP INTO AFFECTED EYE(S) BY OPHTHALMIC ROUTE EVERY 6 HOURS FOR 7 DAYS 09/19/21   [provider]  warfarin (COUMADIN) 3 MG tablet Take 3 mg by mouth daily. 06/23/21   [provider]      Allergies    Amoxicillin-pot clavulanate, Clarithromycin, Erythromycin, Lisinopril, and Amoxicillin    Review of Systems   Review of Systems  Musculoskeletal:   Positive for arthralgias and myalgias.  All other systems reviewed and are negative.   Physical Exam Updated Vital Signs BP (!) 129/57 (BP Location: Right Arm)   Pulse 60   Temp 97.7 F (36.5 C) (Oral)   Resp 16   Ht 5\' 4"  (1.626 m)   Wt 72.6 kg   SpO2 93%   BMI 27.46 kg/m  Physical Exam Vitals and nursing note reviewed.  Constitutional:      General: She is not in acute distress.    Appearance: Normal appearance. She is normal weight. She is not ill-appearing.     Comments: Resting comfortably in bed  HENT:     Head: Normocephalic and atraumatic.  Pulmonary:     Effort: Pulmonary effort is normal. No respiratory distress.  Abdominal:     General: Abdomen is flat.  Musculoskeletal:        General: Normal range of motion.     Cervical back: Neck supple.     Comments: No swelling of the right lower extremity when compared contralaterally.  Some bruising to bilateral lower extremities.  Mild TTP to the medial aspect of the right knee.  DP pulses 2+ bilaterally.  Sensation intact in all digits.  Capillary refill normal.  Mild TTP to the right hip.  Patient ambulatory in the ED with a walker.  No midline T or L-spine TTP.  Mild right lumbar tenderness  Skin:    General: Skin is warm and dry.  Neurological:     Mental Status: She is alert and oriented to person, place, and time.  Psychiatric:        Mood and Affect: Mood normal.        Behavior: Behavior normal.     ED Results / Procedures / Treatments   Labs (all labs ordered are listed, but only abnormal results are displayed) Labs Reviewed - No data to display  EKG None  Radiology DG Knee Complete 4 Views Right  Result Date: 12/10/2022 CLINICAL DATA:  Fall with right knee pain EXAM: RIGHT KNEE - COMPLETE 4 VIEW COMPARISON:  03/21/2022 FINDINGS: Total knee arthroplasty. No acute fracture or dislocation. Subjective osteopenia. Negative for joint  effusion IMPRESSION: No acute finding.  Located total knee arthroplasty.  Electronically Signed   By: Tiburcio Pea M.D.   On: 12/10/2022 12:32   DG Hip Unilat With Pelvis 2-3 Views Right  Result Date: 12/10/2022 CLINICAL DATA:  Fall with hip pain EXAM: DG HIP (WITH OR WITHOUT PELVIS) 3V RIGHT COMPARISON:  None Available. FINDINGS: There is no evidence of hip fracture or dislocation. Generalized degenerative spurring, greatest at the symphysis pubis or there is also sclerosis. Pseudoarticulation between the transverse processes of L5 and the iliac crest. IMPRESSION: No acute finding. Electronically Signed   By: Tiburcio Pea M.D.   On: 12/10/2022 12:32   DG Lumbar Spine 2-3 Views  Result Date: 12/10/2022 CLINICAL DATA:  Fall.  Lumbar pain. EXAM: LUMBAR SPINE - 2-3 VIEW COMPARISON:  04/06/2020 FINDINGS: Bones are diffusely demineralized. No evidence for an acute fracture. Trace anterolisthesis of L3 on 4 is stable. Interval vertebral augmentation of the compression deformity at L1. Loss of disc height noted L4-5 and L5-S1. IMPRESSION: 1. Interval vertebral augmentation of the compression deformity at L1. 2. No acute bony abnormality. 3. Degenerative disc disease at L4-5 and L5-S1. Electronically Signed   By: Kennith Center M.D.   On: 12/10/2022 12:18   CT Head Wo Contrast  Result Date: 12/10/2022 CLINICAL DATA:  Minor head trauma. Right leg and lower back pain after fall at home 2 weeks ago EXAM: CT HEAD WITHOUT CONTRAST TECHNIQUE: Contiguous axial images were obtained from the base of the skull through the vertex without intravenous contrast. RADIATION DOSE REDUCTION: This exam was performed according to the departmental dose-optimization program which includes automated exposure control, adjustment of the mA and/or kV according to patient size and/or use of iterative reconstruction technique. COMPARISON:  04/07/2021 head CT FINDINGS: Brain: No evidence of acute infarction, hemorrhage, hydrocephalus, extra-axial collection or mass lesion/mass effect. Mild cerebral volume  loss. Vascular: No hyperdense vessel or unexpected calcification. Skull: Normal. Negative for fracture or focal lesion. Sinuses/Orbits: No acute finding. IMPRESSION: Unremarkable head CT for age. Electronically Signed   By: Tiburcio Pea M.D.   On: 12/10/2022 11:55    Procedures Procedures    Medications Ordered in ED Medications  HYDROcodone-acetaminophen (NORCO/VICODIN) 5-325 MG per tablet 1 tablet (1 tablet Oral Given 12/10/22 1132)    ED Course/ Medical Decision Making/ A&P                             Medical Decision Making Amount and/or Complexity of Data Reviewed Radiology: ordered.  Risk Prescription drug management.  This patient presents to the ED for concern of fall, right knee, hip, low back pain, this involves an extensive number of treatment options, and is a complaint that carries with it a high risk of complications and morbidity.  The differential diagnosis includes fracture, strain, sprain, contusion, dislocation, abrasion  Co morbidities that complicate the patient evaluation  anxiety, hypertension, hyperlipidemia, previous PE on Coumadin  My initial workup includes imaging, pain control  Additional history obtained from: Nursing notes from this visit.  I ordered imaging studies including x-ray right knee, hip, lumbar spine, CT head I independently visualized and interpreted imaging which showed normal CT for age, no acute bony abnormalities of the lumbar spine, no acute finding of the right knee, no acute finding of the right hip or pelvis I agree with the radiologist interpretation  Afebrile, hemodynamically stable.  72 year old female presenting to the ED for evaluation of a fall  that occurred 2 weeks ago.  She states she has had persistent pain since that time and would like to be checked out today.  She has an appointment with her orthopedic provider on Tuesday.  She has been ambulatory per her baseline since the injury.  She reports right knee, hip, low  back pain.  She denies any neurologic complaints.  She appears fairly well on exam.  She was ambulatory in the ED with a walker.  She reported significant improvement in her symptoms after a dose of her home Norco.  Overall suspect multiple contusions as the cause of her pain today.  Low suspicion for occult fracture given that her fall occurred 2 weeks ago.  Low suspicion for infectious etiology given her lack of infectious symptoms, swelling of the knee, erythema.  Also have low suspicion for DVT given that she is on Coumadin, reports compliance, has no calf pain or lower extremity swelling.  She was monitored in the ED for 3 hours after opioid administration given that she drove herself here. She was encouraged to take her home medications as prescribed and as needed.  She was encouraged to follow-up with her orthopedic provider as scheduled.  She was given return precautions.  Stable at discharge.  At this time there does not appear to be any evidence of an acute emergency medical condition and the patient appears stable for discharge with appropriate outpatient follow up. Diagnosis was discussed with patient who verbalizes understanding of care plan and is agreeable to discharge. I have discussed return precautions with patient who verbalizes understanding. Patient encouraged to follow-up with their PCP within 1 week. All questions answered.  Patient's case discussed with Dr. Durwin Nora who agrees with plan to discharge with follow-up.   Note: Portions of this report may have been transcribed using voice recognition software. Every effort was made to ensure accuracy; however, inadvertent computerized transcription errors may still be present.        Final Clinical Impression(s) / ED Diagnoses Final diagnoses:  Fall, initial encounter  Acute pain of right knee  Right hip pain  Chronic right-sided low back pain without sciatica    Rx / DC Orders ED Discharge Orders     None          Michelle Piper, Cordelia Poche 12/10/22 1348    Gloris Manchester, MD 12/10/22 [phone]

## 2022-12-10 NOTE — ED Triage Notes (Signed)
Pt via POV c/o right leg and right lower back pain after a fall at home 2 weeks ago. Pt ambulatory with walker. Pain rated 7/10 in right knee and ratiates up towards hip. No deformity noted; pt reports prior TKA to right knee years ago.

## 2022-12-10 NOTE — Discharge Instructions (Signed)
You have been seen today for your complaint of fall, right knee pain, right hip pain, low back pain. Your imaging was reassuring and showed no abnormalities. Your discharge medications include your home medications.  Take them as needed. Home care instructions are as follows:  Change positions slowly.  Use your walker and move carefully Follow up with: Your orthopedic doctor on Tuesday as scheduled Please seek immediate medical care if you develop any of the following symptoms: Lose consciousness or have trouble moving after a fall. Have a fall that causes a head injury. At this time there does not appear to be the presence of an emergent medical condition, however there is always the potential for conditions to change. Please read and follow the below instructions.  Do not take your medicine if  develop an itchy rash, swelling in your mouth or lips, or difficulty breathing; call 911 and seek immediate emergency medical attention if this occurs.  You may review your lab tests and imaging results in their entirety on your MyChart account.  Please discuss all results of fully with your primary care provider and other specialist at your follow-up visit.  Note: Portions of this text may have been transcribed using voice recognition software. Every effort was made to ensure accuracy; however, inadvertent computerized transcription errors may still be present.

## 2022-12-11 LAB — IMMUNOFIXATION ELECTROPHORESIS
IgA: 159 mg/dL (ref 64–422)
IgG (Immunoglobin G), Serum: 394 mg/dL — ABNORMAL LOW (ref 586–1602)
IgM (Immunoglobulin M), Srm: 26 mg/dL (ref 26–217)
Total Protein ELP: 6.2 g/dL (ref 6.0–8.5)

## 2022-12-12 ENCOUNTER — Ambulatory Visit: Payer: Self-pay

## 2022-12-12 ENCOUNTER — Encounter (HOSPITAL_COMMUNITY): Payer: Self-pay | Admitting: Hematology

## 2022-12-12 ENCOUNTER — Encounter: Payer: Self-pay | Admitting: Hematology

## 2022-12-12 ENCOUNTER — Telehealth: Payer: Self-pay

## 2022-12-12 NOTE — Chronic Care Management (AMB) (Signed)
   12/12/2022  Courtney Rogers [birth year] [id number]   Reason for Encounter: Patient is not currently enrolled in the CCM program. CCM enrollment status changed to Previously enrolled.   [facility]/Chronic Care Management [phone]

## 2022-12-12 NOTE — Transitions of Care (Post Inpatient/ED Visit) (Signed)
   12/12/2022  Name: Courtney Rogers MRN: [id number] DOB: [birth year]  Today's TOC FU Call Status: Today's TOC FU Call Status:: Unsuccessul Call (1st Attempt) Unsuccessful Call (1st Attempt) Date: 12/12/22  Attempted to reach the patient regarding the most recent Inpatient/ED visit.  Follow Up Plan: Additional outreach attempts will be made to reach the patient to complete the Transitions of Care (Post Inpatient/ED visit) call.   Signature   Woodfin Ganja LPN [facility] Nurse Health Advisor Direct Dial [phone]

## 2022-12-13 NOTE — Transitions of Care (Post Inpatient/ED Visit) (Unsigned)
   12/13/2022  Name: Courtney Rogers MRN: [id number] DOB: 07/30/1950  Today's TOC FU Call Status: Today's TOC FU Call Status:: Unsuccessful Call (2nd Attempt) Unsuccessful Call (1st Attempt) Date: 12/12/22 Unsuccessful Call (2nd Attempt) Date: 12/13/22  Attempted to reach the patient regarding the most recent Inpatient/ED visit.  Follow Up Plan: Additional outreach attempts will be made to reach the patient to complete the Transitions of Care (Post Inpatient/ED visit) call.   Signature   Woodfin Ganja LPN [facility] Nurse Health Advisor Direct Dial [phone]

## 2022-12-14 ENCOUNTER — Other Ambulatory Visit: Payer: Self-pay

## 2022-12-14 MED ORDER — LENALIDOMIDE 10 MG PO CAPS: ORAL_CAPSULE | ORAL | 0 refills | Status: DC

## 2022-12-14 NOTE — Transitions of Care (Post Inpatient/ED Visit) (Signed)
   12/14/2022  Name: Courtney Rogers MRN: [id number] DOB: Apr 28, 1951  Today's TOC FU Call Status: Today's TOC FU Call Status:: Successful TOC FU Call Competed Unsuccessful Call (1st Attempt) Date: 12/12/22 Unsuccessful Call (2nd Attempt) Date: 12/13/22 Unsuccessful Call (3rd Attempt) Date: 12/14/22 [facility] FU Call Complete Date: 12/14/22  Attempted to reach the patient regarding the most recent Inpatient/ED visit.  Follow Up Plan: No further outreach attempts will be made at this time. We have been unable to contact the patient.  Signature  Woodfin Ganja LPN [facility] Nurse Health Advisor Direct Dial [phone]

## 2022-12-14 NOTE — Telephone Encounter (Signed)
Chart reviewed. Revlimid refilled per last office note with Dr. Katragadda.  

## 2022-12-15 ENCOUNTER — Other Ambulatory Visit: Payer: Self-pay | Admitting: Family Medicine

## 2022-12-15 DIAGNOSIS — R32 Unspecified urinary incontinence: Secondary | ICD-10-CM

## 2022-12-19 ENCOUNTER — Ambulatory Visit (INDEPENDENT_AMBULATORY_CARE_PROVIDER_SITE_OTHER): Payer: Medicare Other | Admitting: Family Medicine

## 2022-12-19 ENCOUNTER — Encounter: Payer: Self-pay | Admitting: Family Medicine

## 2022-12-19 VITALS — BP 103/66 | HR 85 | Wt 161.1 lb

## 2022-12-19 DIAGNOSIS — W19XXXD Unspecified fall, subsequent encounter: Secondary | ICD-10-CM

## 2022-12-19 DIAGNOSIS — F339 Major depressive disorder, recurrent, unspecified: Secondary | ICD-10-CM | POA: Diagnosis not present

## 2022-12-19 MED ORDER — SERTRALINE HCL 25 MG PO TABS
25.0000 mg | ORAL_TABLET | Freq: Every day | ORAL | 3 refills | Status: DC
Start: 2022-12-19 — End: 2023-03-07

## 2022-12-19 NOTE — Progress Notes (Signed)
Established Patient Office Visit  Subjective:  Patient ID: Courtney Rogers, female    DOB: 19-Apr-1951  Age: 72 y.o. MRN: [id number]  CC:  Chief Complaint  Patient presents with   Care Management    Pt f/u from Ed visit on 12/10/22, had a fall, pt reports feeling tired and down today.    HPI Courtney Rogers is a 72 y.o. female with past medical history of anxiety, hypertension, hyperlipidemia, previous PE on Coumadin presents for ED follow-up.  Fall:The patient presented to the ED for evaluation of a fall that occurred 2 weeks ago with complaints of persistent pain since the fall.she had x-ray of the right knee, hip, lumbar spine a CT scan of the head which showed normal CT scan for age, no acute bony abnormalities of the lumbar spine, no acute findings of the right knee, and no acute findings of the right hip or pelvis.  She was monitored in the ED for 3 hours after opioid ministration and encouraged to follow-up with orthopedics as scheduled. She reports following up with her orthopedic in [place] since her ED visit. No recent falls reports. The pt is seen in the clinic using her walker.   Past Medical History:  Diagnosis Date   Acute myocardial infarction [facility]) 2009   CAD/no stent medically managed   Anxiety disorder    CAD (coronary artery disease)    Cancer (HCC)    multiple myeloma   Hyperlipidemia    Hypertension    IBS (irritable bowel syndrome)    Non-ulcer dyspepsia 04/30/2009   Qualifier: Diagnosis of  By: Darrick Penna MD, Sandi L    Overactive bladder    PE (pulmonary embolism) 10/2012   left lung   Presence of permanent cardiac pacemaker    Sleep apnea     Past Surgical History:  Procedure Laterality Date   ABDOMINAL HYSTERECTOMY     BSO secondary to cyst     CARDIAC CATHETERIZATION     COLONOSCOPY  12/2009   Dr. Aleene Davidson, propofol, normal. Next TCS 12/2019   COLONOSCOPY N/A 05/22/2017   Examined portion ileum was normal, significant looping of colon,  external hemorrhoids and rectal bleeding due to internal hemorrhoids, mild diverticulosis procedure: COLONOSCOPY;  Surgeon: West Bali, MD;  Location: [facility];  Service: Endoscopy;  Laterality: N/A;  10:30am   ESOPHAGOGASTRODUODENOSCOPY  05/11/2009   schatzki ring/small hiatal hernia/path:gastritis   ESOPHAGOGASTRODUODENOSCOPY N/A 05/22/2017   Multiple gastric polyps with biopsy benign fundic gland polyp, small bowel biopsy negative for celiac, gastric biopsy with mild gastritis but no H. pylori procedure: ESOPHAGOGASTRODUODENOSCOPY (EGD);  Surgeon: West Bali, MD;  Location: [facility];  Service: Endoscopy;  Laterality: N/A;   ESOPHAGOGASTRODUODENOSCOPY (EGD) WITH ESOPHAGEAL DILATION N/A 04/01/2013   WUJ:[id number] at the gastroesophageal juction/multiple small polyps/mild gastritis   GIVENS CAPSULE STUDY N/A 06/07/2017   Frequent gastric erosions, normal small bowel mucosa.  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    Family History  Problem Relation Age of Onset   Colon polyps Neg Hx    Colon cancer Neg Hx     Social History   Socioeconomic History   Marital status: Married    Spouse name: [name]   Number of children: 3  Years of education: 21   Highest education level: Not on file  Occupational History   Not on file  Tobacco Use   Smoking status: Never    Passive exposure: Never   Smokeless tobacco: Never   Tobacco comments:    Never smoked   Vaping Use   Vaping status: Never Used  Substance and Sexual Activity   Alcohol use: Not Currently    Comment: occ wine   Drug use: No   Sexual activity: Not Currently  Other Topics Concern   Not on file  Social History Narrative      Lives with husband-51 years 950 in Aug 2021   Daughter is  close by, 2 sons live further away   One IN ,ONE IN [place], ONE BESIDE HER.        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   Enjoys: reading, young adult      Diet: eats all food groups    Caffeine: coffee 1, tea daily soda-1 daily   Water: 1-2 cups      Wears seat belt    Does not use phone while driving   Smoke detectors at home    Licensed conveyancer -locked up      Left handed   One story home   Drinks caffeine   Social Determinants of Health   Financial Resource Strain: Low Risk  (12/21/2022)   Overall Financial Resource Strain (CARDIA)    Difficulty of Paying Living Expenses: Not hard at all  Food Insecurity: No Food Insecurity (12/21/2022)   Hunger Vital Sign    Worried About Running Out of Food in the Last Year: Never true    Ran Out of Food in the Last Year: Never true  Transportation Needs: No Transportation Needs (12/21/2022)   PRAPARE - Administrator, Civil Service (Medical): No    Lack of Transportation (Non-Medical): No  Physical Activity: Inactive (12/21/2022)   Exercise Vital Sign    Days of Exercise per Week: 0 days    Minutes of Exercise per Session: 0 min  Stress: Stress Concern Present (12/21/2022)   [employer] of Occupational Health - Occupational Stress Questionnaire    Feeling of Stress : To some extent  Social Connections: Moderately Integrated (12/21/2022)   Social Connection and Isolation Panel [NHANES]    Frequency of Communication with Friends and Family: More than three times a week    Frequency of Social Gatherings with Friends and Family: More than three times a week    Attends Religious Services: More than 4 times per year    Active Member of [employer] or Organizations: No    Attends Banker Meetings: Never    Marital Status: Married  Catering manager Violence: Not At Risk (12/21/2022)   Humiliation, Afraid, Rape, and Kick questionnaire    Fear of Current or Ex-Partner: No    Emotionally  Abused: No    Physically Abused: No    Sexually Abused: No    Outpatient Medications Prior to Visit  Medication Sig Dispense Refill   acetaminophen (TYLENOL) 500 MG tablet Take 1,000 mg by mouth 2 (two) times daily as needed for moderate pain or headache.     albuterol (VENTOLIN HFA) 108 (90 Base) MCG/ACT inhaler USE 2 PUFFS ONE TO TWO TIMES A DAY. 18 g 5   amLODipine (NORVASC) 10 MG tablet TAKE ONE TABLET BY MOUTH ONCE DAILY 90 tablet 0   ascorbic acid (VITAMIN C) 500 MG  tablet Take 500 mg by mouth daily.     aspirin EC 81 MG tablet Take 81 mg by mouth at bedtime.     B Complex Vitamins (VITAMIN B COMPLEX) TABS Take 1 tablet by mouth daily.     B Complex-Folic Acid (B COMPLEX VITAMINS, W/ FA,) CAPS Take 1 capsule by mouth daily.     bortezomib SQ (VELCADE) 3.5 MG injection Inject into the skin once.     Calcium Carbonate-Vitamin D (CALCIUM 600+D PO) Take 1 tablet by mouth 2 (two) times daily.     chlorthalidone (HYGROTON) 25 MG tablet Take 25 mg by mouth daily.     cholecalciferol (VITAMIN D3) 10 MCG (400 UNIT) TABS tablet Take 400 Units by mouth daily.     cholecalciferol 25 MCG (1000 UT) tablet Take by mouth.     cyanocobalamin (VITAMIN B12) 1000 MCG tablet Take by mouth.     Daratumumab-Hyaluronidase-fihj (DARZALEX FASPRO [place]) Inject into the skin.     diclofenac Sodium (VOLTAREN) 1 % GEL Apply 4 g topically 4 (four) times daily. 50 g 0   fluticasone (FLONASE) 50 MCG/ACT nasal spray USE 2 SPRAYS IN EACH NOSTRIL ONCE DAILY 16 g 0   fluticasone-salmeterol (ADVAIR) 250-50 MCG/ACT AEPB Inhale into the lungs.     folic acid (FOLVITE) 1 MG tablet TAKE 1 TABLET BY MOUTH ONCE DAILY. 30 tablet 11   gabapentin (NEURONTIN) 100 MG capsule Take 100 mg by mouth 3 (three) times daily.     hydrochlorothiazide (MICROZIDE) 12.5 MG capsule Take 12.5 mg by mouth daily.     HYDROcodone-acetaminophen (NORCO/VICODIN) 5-325 MG tablet TAKE ONE TABLET BY MOUTH EVERY 8 HOURS AS NEEDED FOR MODERATE PAIN 60 tablet  0   Hyoscyamine Sulfate SL 0.125 MG SUBL      lenalidomide (REVLIMID) 10 MG capsule Take 1 capsule (10 mg total) by mouth daily. 21 days on, 7 days off every 28 days 21 capsule 0   lidocaine (HM LIDOCAINE PATCH) 4 % Place 1 patch onto the skin daily. 14 patch 0   lidocaine (XYLOCAINE) 2 % solution Take 2 teaspoons before meals and at bedtime as needed. May repeat every 4 hours for a maximum dose of 8 per day. 300 mL 0   Lidocaine HCl (XOLIDO) 2 % CREA Apply 1 application topically daily as needed (arthritisis).     loperamide (IMODIUM) 2 MG capsule Take 4 mg by mouth as needed. Take 2 tablets after the first loose stool and then 1 tablet after each loose stool.  Do not exceed 8 capsules within 24 hours.     losartan (COZAAR) 100 MG tablet      magnesium oxide (MAG-OX) 400 (240 Mg) MG tablet Take 1 tablet (400 mg total) by mouth 2 (two) times daily. 60 tablet 4   metoprolol tartrate (LOPRESSOR) 50 MG tablet Take 1 tablet (50 mg total) by mouth 2 (two) times daily. (Patient taking differently: Take 25 mg by mouth 2 (two) times daily.) 180 tablet 1   montelukast (SINGULAIR) 10 MG tablet Take 10 mg by mouth daily.     montelukast (SINGULAIR) 10 MG tablet Take 1 tablet (10 mg total) by mouth at bedtime. 30 tablet 3   MYRBETRIQ 50 MG TB24 tablet TAKE ONE TABLET BY MOUTH ONCE DAILY 30 tablet 1   nitroGLYCERIN (NITROSTAT) 0.4 MG SL tablet Place 0.4 mg under the tongue every 5 (five) minutes as needed for chest pain. (Patient not taking: Reported on 12/22/2022)     pantoprazole (PROTONIX) 40  MG tablet Take 40 mg by mouth daily.     potassium chloride (KLOR-CON M) 10 MEQ tablet      potassium chloride SA (KLOR-CON M) 20 MEQ tablet Take 1 tablet (20 mEq total) by mouth 3 (three) times daily. 21 tablet 0   primidone (MYSOLINE) 50 MG tablet Take by mouth.     Probiotic Product (GNP PROBIOTIC COLON SUPPORT) CAPS Take 1 capsule by mouth daily.     simvastatin (ZOCOR) 40 MG tablet TAKE ONE TABLET BY MOUTH AT  BEDTIME 90 tablet 1   tadalafil, PAH, (ALYQ) 20 MG tablet Take 2 tablets every day by oral route.     warfarin (COUMADIN) 3 MG tablet Take 3 mg by mouth daily.     acyclovir (ZOVIRAX) 400 MG tablet TAKE 1 TABLET BY MOUTH TWICE DAILY 60 tablet 6   busPIRone (BUSPAR) 5 MG tablet Take 1 tablet (5 mg total) by mouth daily. (Patient not taking: Reported on 12/21/2022) 30 tablet 3   cyclobenzaprine (FLEXERIL) 10 MG tablet TAKE ONE-HALF TABLET BY MOUTH TWICE DAILY AS NEEDED FOR MUSCLE SPASMS 20 tablet 0   doxycycline (VIBRAMYCIN) 100 MG capsule Take 100 mg by mouth daily.     levocetirizine (XYZAL) 5 MG tablet TAKE ONE TABLET BY MOUTH IN THE EVENING 30 tablet 1   metoprolol succinate (TOPROL-XL) 50 MG 24 hr tablet  (Patient not taking: Reported on 12/21/2022)     mirtazapine (REMERON) 15 MG tablet Take 15 mg by mouth at bedtime.     primidone (MYSOLINE) 50 MG tablet Take 75 mg by mouth daily.     sertraline (ZOLOFT) 100 MG tablet Take 200 mg by mouth daily.      traMADol (ULTRAM) 50 MG tablet Take 50 mg by mouth 2 (two) times daily. (Patient not taking: Reported on 12/21/2022)     trimethoprim-polymyxin b (POLYTRIM) ophthalmic solution INSTILL 1 DROP INTO AFFECTED EYE(S) BY OPHTHALMIC ROUTE EVERY 6 HOURS FOR 7 DAYS (Patient not taking: Reported on 12/21/2022)     No facility-administered medications prior to visit.    Allergies  Allergen Reactions   Amoxicillin-Pot Clavulanate Other (See Comments)   Clarithromycin Other (See Comments)    Stomach problems   Erythromycin    Lisinopril Swelling   Amoxicillin Diarrhea    ROS Review of Systems  Constitutional:  Negative for chills and fever.  Eyes:  Negative for visual disturbance.  Respiratory:  Negative for chest tightness and shortness of breath.   Neurological:  Negative for dizziness and headaches.      Objective:    Physical Exam HENT:     Head: Normocephalic.     Mouth/Throat:     Mouth: Mucous membranes are moist.  Cardiovascular:      Rate and Rhythm: Normal rate.     Heart sounds: Normal heart sounds.  Pulmonary:     Effort: Pulmonary effort is normal.     Breath sounds: Normal breath sounds.  Neurological:     Mental Status: She is alert.     BP 103/66   Pulse 85   Wt 161 lb 1.9 oz (73.1 kg)   SpO2 96%   BMI 27.66 kg/m  Wt Readings from Last 3 Encounters:  12/22/22 160 lb 4.8 oz (72.7 kg)  12/21/22 160 lb (72.6 kg)  12/19/22 161 lb 1.9 oz (73.1 kg)    Lab Results  Component Value Date   TSH 1.180 06/27/2022   Lab Results  Component Value Date   WBC 3.1 (L) 12/22/2022  HGB 11.6 (L) 12/22/2022   HCT 34.5 (L) 12/22/2022   MCV 102.7 (H) 12/22/2022   PLT 86 (L) 12/22/2022   Lab Results  Component Value Date   NA 138 12/22/2022   K 3.2 (L) 12/22/2022   CO2 28 12/22/2022   GLUCOSE 102 (H) 12/22/2022   BUN 13 12/22/2022   CREATININE 0.85 12/22/2022   BILITOT 0.8 12/22/2022   ALKPHOS 42 12/22/2022   AST 25 12/22/2022   ALT 23 12/22/2022   PROT 6.5 12/22/2022   ALBUMIN 4.1 12/22/2022   CALCIUM 8.5 (L) 12/22/2022   ANIONGAP 9 12/22/2022   EGFR 58 (L) 06/04/2021   Lab Results  Component Value Date   CHOL 146 06/27/2022   Lab Results  Component Value Date   HDL 65 06/27/2022   Lab Results  Component Value Date   LDLCALC 62 06/27/2022   Lab Results  Component Value Date   TRIG 102 06/27/2022   Lab Results  Component Value Date   CHOLHDL 2.2 06/27/2022   Lab Results  Component Value Date   HGBA1C 5.2 06/27/2022      Assessment & Plan:  Fall, subsequent encounter Assessment & Plan: Labs and imaging reviewed She take percocet 5-325 mg as needed for pain No recent falls reported Encouraged keeping her homes and walkways free of clutter    Depression, recurrent (HCC) Assessment & Plan: PHQ-9 is 9 Reports minimum relief with buspar and not been taking meds Advised to not abruptly d/c antidepressants Will start the pt on Zoloft 25 mg daily Advised that it may take  2-4 weeks for benefit and 6-8 weeks for full effect Denies SI/ HI  Orders: -     Sertraline HCl; Take 1 tablet (25 mg total) by mouth daily.  Dispense: 30 tablet; Refill: 3  Note: This chart has been completed using Engineer, civil (consulting) software, and while attempts have been made to ensure accuracy, certain words and phrases may not be transcribed as intended.    Follow-up: Return in about 6 weeks (around 01/30/2023) for anxiety and depression.   Gilmore Laroche, FNP

## 2022-12-19 NOTE — Patient Instructions (Addendum)
I appreciate the opportunity to provide care to you today!    Follow up:  6 weeks   Please start taking Zoloft 25 mg daily Please reports worsening mood or suicidal thoughts while on this medication Take at the same time each day, with or without food May take at least 2 weeks for benefit, and 6-8 weeks for full effect Do not abruptly discontinue to avoid withdrawal symptoms   Nonpharmacologic management of anxiety and depression  Mindfulness and Meditation Practices like mindfulness meditation can help reduce symptoms by promoting relaxation and present-moment awareness.  Exercise  Regular physical activity has been shown to improve mood and reduce anxiety through the release of endorphins and other neurochemicals.  Healthy Diet Eating a balanced diet rich in fruits, vegetables, whole grains, and lean proteins can support overall mental health.  Sleep Hygiene  Establishing a regular sleep routine and ensuring good sleep quality can significantly impact mood and anxiety levels.  Stress Management Techniques Activities such as yoga, tai chi, and deep breathing exercises can help manage stress.  Social Support Maintaining strong relationships and seeking support from friends, family, or support groups can provide emotional comfort and reduce feelings of isolation.  Lifestyle Modifications Reducing alcohol and caffeine intake, quitting smoking, and avoiding recreational drugs can improve symptoms.  Art and Music Therapy Engaging in creative activities like painting, drawing, or playing music can be therapeutic and help express emotions.  Light Therapy Particularly useful for seasonal affective disorder (SAD), exposure to bright light can help regulate mood. .   Attached with your AVS, you will find valuable resources for self-education. I highly recommend dedicating some time to thoroughly examine them.   Please continue to a heart-healthy diet and increase your physical activities.  Try to exercise for at least five days a week.    It was a pleasure to see you and I look forward to continuing to work together on your health and well-being. Please do not hesitate to call the office if you need care or have questions about your care.  In case of emergency, please visit the Emergency Department for urgent care, or contact our clinic at [phone] to schedule an appointment. We're here to help you!   Have a wonderful day and week. With Gratitude, Gilmore Laroche MSN, FNP-BC

## 2022-12-20 ENCOUNTER — Other Ambulatory Visit: Payer: Self-pay | Admitting: Hematology

## 2022-12-21 ENCOUNTER — Ambulatory Visit (INDEPENDENT_AMBULATORY_CARE_PROVIDER_SITE_OTHER): Payer: Medicare Other

## 2022-12-21 VITALS — Ht 64.0 in | Wt 160.0 lb

## 2022-12-21 DIAGNOSIS — Z0000 Encounter for general adult medical examination without abnormal findings: Secondary | ICD-10-CM | POA: Diagnosis not present

## 2022-12-21 DIAGNOSIS — Z1231 Encounter for screening mammogram for malignant neoplasm of breast: Secondary | ICD-10-CM

## 2022-12-21 NOTE — Progress Notes (Signed)
 Because this visit was a virtual/telehealth visit,  certain criteria was not obtained, such a blood pressure, CBG if patient is a diabetic, and timed up and go. Any medications not marked as "taking" was not mentioned during the medication reconciliation part of the visit. Any vitals not documented were not able to be obtained due to this being a telehealth visit. Vitals documented are verbally provided by the patient.   Per patient no change in vitals since last visit, unable to obtain new vitals due to telehealth visit.   Subjective:   Courtney Rogers is a 72 y.o. female who presents for Medicare Annual (Subsequent) preventive examination.  Visit Complete: Virtual  I connected with  Etta Grandchild on 12/21/22 by a audio enabled telemedicine application and verified that I am speaking with the correct person using two identifiers.  Patient Location: Home  Provider Location: Home Office  I discussed the limitations of evaluation and management by telemedicine. The patient expressed understanding and agreed to proceed.  Patient Medicare AWV questionnaire was completed by the patient on n/a; I have confirmed that all information answered by patient is correct and no changes since this date.  Review of Systems     Cardiac Risk Factors include: advanced age (>49men, >75 women);dyslipidemia;hypertension;sedentary lifestyle     Objective:    Today's Vitals   12/21/22 1519 12/21/22 1529  Weight: 160 lb (72.6 kg)   Height: 5\' 4"  (1.626 m)   PainSc:  0-No pain   Body mass index is 27.46 kg/m.     12/21/2022    3:18 PM 12/10/2022   11:14 AM 11/23/2022    3:12 PM 11/14/2022    3:13 PM 11/09/2022    3:44 PM 10/26/2022   11:53 AM 10/12/2022    1:57 PM  Advanced Directives  Does Patient Have a Medical Advance Directive? No No Yes Yes Yes Yes No  Type of Advance Directive   Living will;Healthcare Power of [employer] Power of [place];Living will Living will;Healthcare Power of  [employer] Power of [place];Living will   Does patient want to make changes to medical advance directive?   No - Patient declined  No - Patient declined No - Patient declined No - Patient declined  Copy of Healthcare Power of Attorney in Chart?      No - copy requested   Would patient like information on creating a medical advance directive? No - Patient declined  No - Patient declined  No - Patient declined No - Patient declined No - Patient declined    Current Medications (verified) Outpatient Encounter Medications as of 12/21/2022  Medication Sig   acetaminophen (TYLENOL) 500 MG tablet Take 1,000 mg by mouth 2 (two) times daily as needed for moderate pain or headache.   acyclovir (ZOVIRAX) 400 MG tablet TAKE ONE TABLET BY MOUTH TWICE DAILY   albuterol (VENTOLIN HFA) 108 (90 Base) MCG/ACT inhaler USE 2 PUFFS ONE TO TWO TIMES A DAY.   amLODipine (NORVASC) 10 MG tablet TAKE ONE TABLET BY MOUTH ONCE DAILY   ascorbic acid (VITAMIN C) 500 MG tablet Take 500 mg by mouth daily.   aspirin EC 81 MG tablet Take 81 mg by mouth at bedtime.   B Complex Vitamins (VITAMIN B COMPLEX) TABS Take 1 tablet by mouth daily.   B Complex-Folic Acid (B COMPLEX VITAMINS, W/ FA,) CAPS Take 1 capsule by mouth daily.   bortezomib SQ (VELCADE) 3.5 MG injection Inject into the skin once.   Calcium Carbonate-Vitamin D (CALCIUM  600+D PO) Take 1 tablet by mouth 2 (two) times daily.   chlorthalidone (HYGROTON) 25 MG tablet Take 25 mg by mouth daily.   cholecalciferol (VITAMIN D3) 10 MCG (400 UNIT) TABS tablet Take 400 Units by mouth daily.   cholecalciferol 25 MCG (1000 UT) tablet Take by mouth.   cyanocobalamin (VITAMIN B12) 1000 MCG tablet Take by mouth.   cyclobenzaprine (FLEXERIL) 10 MG tablet TAKE ONE-HALF TABLET BY MOUTH TWICE DAILY AS NEEDED FOR MUSCLE SPASMS   Daratumumab-Hyaluronidase-fihj (DARZALEX FASPRO ) Inject into the skin.   doxycycline (VIBRAMYCIN) 100 MG capsule Take 100 mg by mouth daily.    fluticasone (FLONASE) 50 MCG/ACT nasal spray USE 2 SPRAYS IN EACH NOSTRIL ONCE DAILY   fluticasone-salmeterol (ADVAIR) 250-50 MCG/ACT AEPB Inhale into the lungs.   folic acid (FOLVITE) 1 MG tablet TAKE 1 TABLET BY MOUTH ONCE DAILY.   gabapentin (NEURONTIN) 100 MG capsule Take 100 mg by mouth 3 (three) times daily.   HYDROcodone-acetaminophen (NORCO/VICODIN) 5-325 MG tablet TAKE ONE TABLET BY MOUTH EVERY 8 HOURS AS NEEDED FOR MODERATE PAIN   Hyoscyamine Sulfate SL 0.125 MG SUBL    lenalidomide (REVLIMID) 10 MG capsule Take 1 capsule (10 mg total) by mouth daily. 21 days on, 7 days off every 28 days   levocetirizine (XYZAL) 5 MG tablet TAKE ONE TABLET BY MOUTH IN THE EVENING   lidocaine (HM LIDOCAINE PATCH) 4 % Place 1 patch onto the skin daily.   lidocaine (XYLOCAINE) 2 % solution Take 2 teaspoons before meals and at bedtime as needed. May repeat every 4 hours for a maximum dose of 8 per day.   Lidocaine HCl (XOLIDO) 2 % CREA Apply 1 application topically daily as needed (arthritisis).   loperamide (IMODIUM) 2 MG capsule Take 4 mg by mouth as needed. Take 2 tablets after the first loose stool and then 1 tablet after each loose stool.  Do not exceed 8 capsules within 24 hours.   losartan (COZAAR) 100 MG tablet    magnesium oxide (MAG-OX) 400 (240 Mg) MG tablet Take 1 tablet (400 mg total) by mouth 2 (two) times daily.   montelukast (SINGULAIR) 10 MG tablet Take 1 tablet (10 mg total) by mouth at bedtime.   MYRBETRIQ 50 MG TB24 tablet TAKE ONE TABLET BY MOUTH ONCE DAILY   nitroGLYCERIN (NITROSTAT) 0.4 MG SL tablet Place 0.4 mg under the tongue every 5 (five) minutes as needed for chest pain.   pantoprazole (PROTONIX) 40 MG tablet Take 40 mg by mouth daily.   potassium chloride SA (KLOR-CON M) 20 MEQ tablet Take 1 tablet (20 mEq total) by mouth 3 (three) times daily.   primidone (MYSOLINE) 50 MG tablet Take 75 mg by mouth daily.   Probiotic Product (GNP PROBIOTIC COLON SUPPORT) CAPS Take 1 capsule by  mouth daily.   sertraline (ZOLOFT) 25 MG tablet Take 1 tablet (25 mg total) by mouth daily.   simvastatin (ZOCOR) 40 MG tablet TAKE ONE TABLET BY MOUTH AT BEDTIME   tadalafil, PAH, (ALYQ) 20 MG tablet Take 2 tablets every day by oral route.   warfarin (COUMADIN) 3 MG tablet Take 3 mg by mouth daily.   busPIRone (BUSPAR) 5 MG tablet Take 1 tablet (5 mg total) by mouth daily. (Patient not taking: Reported on 12/21/2022)   diclofenac Sodium (VOLTAREN) 1 % GEL Apply 4 g topically 4 (four) times daily. (Patient not taking: Reported on 12/21/2022)   hydrochlorothiazide (MICROZIDE) 12.5 MG capsule Take 12.5 mg by mouth daily. (Patient not taking:  Reported on 12/21/2022)   metoprolol succinate (TOPROL-XL) 50 MG 24 hr tablet  (Patient not taking: Reported on 12/21/2022)   metoprolol tartrate (LOPRESSOR) 50 MG tablet Take 1 tablet (50 mg total) by mouth 2 (two) times daily. (Patient taking differently: Take 25 mg by mouth 2 (two) times daily.)   montelukast (SINGULAIR) 10 MG tablet Take 10 mg by mouth daily.  (Patient not taking: Reported on 12/21/2022)   potassium chloride (KLOR-CON M) 10 MEQ tablet  (Patient not taking: Reported on 12/21/2022)   primidone (MYSOLINE) 50 MG tablet Take by mouth. (Patient not taking: Reported on 12/21/2022)   traMADol (ULTRAM) 50 MG tablet Take 50 mg by mouth 2 (two) times daily. (Patient not taking: Reported on 12/21/2022)   trimethoprim-polymyxin b (POLYTRIM) ophthalmic solution INSTILL 1 DROP INTO AFFECTED EYE(S) BY OPHTHALMIC ROUTE EVERY 6 HOURS FOR 7 DAYS (Patient not taking: Reported on 12/21/2022)   No facility-administered encounter medications on file as of 12/21/2022.    Allergies (verified) Amoxicillin-pot clavulanate, Clarithromycin, Erythromycin, Lisinopril, and Amoxicillin   History: Past Medical History:  Diagnosis Date   Acute myocardial infarction [facility]) 2009   CAD/no stent medically managed   Anxiety disorder    CAD (coronary artery disease)    Cancer (HCC)     multiple myeloma   Hyperlipidemia    Hypertension    IBS (irritable bowel syndrome)    Non-ulcer dyspepsia 04/30/2009   Qualifier: Diagnosis of  By: Darrick Penna MD, Sandi L    Overactive bladder    PE (pulmonary embolism) 10/2012   left lung   Presence of permanent cardiac pacemaker    Sleep apnea    Past Surgical History:  Procedure Laterality Date   ABDOMINAL HYSTERECTOMY     BSO secondary to cyst     CARDIAC CATHETERIZATION     COLONOSCOPY  12/2009   Dr. Aleene Davidson, propofol, normal. Next TCS 12/2019   COLONOSCOPY N/A 05/22/2017   Examined portion ileum was normal, significant looping of colon, external hemorrhoids and rectal bleeding due to internal hemorrhoids, mild diverticulosis procedure: COLONOSCOPY;  Surgeon: West Bali, MD;  Location: [facility];  Service: Endoscopy;  Laterality: N/A;  10:30am   ESOPHAGOGASTRODUODENOSCOPY  05/11/2009   schatzki ring/small hiatal hernia/path:gastritis   ESOPHAGOGASTRODUODENOSCOPY N/A 05/22/2017   Multiple gastric polyps with biopsy benign fundic gland polyp, small bowel biopsy negative for celiac, gastric biopsy with mild gastritis but no H. pylori procedure: ESOPHAGOGASTRODUODENOSCOPY (EGD);  Surgeon: West Bali, MD;  Location: [facility];  Service: Endoscopy;  Laterality: N/A;   ESOPHAGOGASTRODUODENOSCOPY (EGD) WITH ESOPHAGEAL DILATION N/A 04/01/2013   LNL:[id number] at the gastroesophageal juction/multiple small polyps/mild gastritis   GIVENS CAPSULE STUDY N/A 06/07/2017   Frequent gastric erosions, normal small bowel mucosa.  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   Family History  Problem Relation Age of Onset   Colon polyps Neg Hx    Colon cancer Neg Hx    Social History   Socioeconomic History    Marital status: Married    Spouse name: [name]   Number of children: 3   Years of education: 16   Highest education level: Not on file  Occupational History   Not on file  Tobacco Use   Smoking status: Never  Passive exposure: Never   Smokeless tobacco: Never   Tobacco comments:    Never smoked   Vaping Use   Vaping status: Never Used  Substance and Sexual Activity   Alcohol use: Not Currently    Comment: occ wine   Drug use: No   Sexual activity: Not Currently  Other Topics Concern   Not on file  Social History Narrative      Lives with husband-51 years 950 in Aug 2021   Daughter is close by, 2 sons live further away   One IN [place],ONE IN [place], ONE BESIDE HER.        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   Enjoys: reading, young adult      Diet: eats all food groups    Caffeine: coffee 1, tea daily soda-1 daily   Water: 1-2 cups      Wears seat belt    Does not use phone while driving   Smoke detectors at home    Licensed conveyancer -locked up      Left handed   One story home   Drinks caffeine   Social Determinants of Health   Financial Resource Strain: Low Risk  (12/21/2022)   Overall Financial Resource Strain (CARDIA)    Difficulty of Paying Living Expenses: Not hard at all  Food Insecurity: No Food Insecurity (12/21/2022)   Hunger Vital Sign    Worried About Running Out of Food in the Last Year: Never true    Ran Out of Food in the Last Year: Never true  Transportation Needs: No Transportation Needs (12/21/2022)   PRAPARE - Administrator, Civil Service (Medical): No    Lack of Transportation (Non-Medical): No  Physical Activity: Inactive (12/21/2022)   Exercise Vital Sign    Days of Exercise per Week: 0 days    Minutes of Exercise per Session: 0 min  Stress: Stress Concern Present (12/21/2022)   [employer] of Occupational Health - Occupational Stress Questionnaire    Feeling of Stress : To some extent   Social Connections: Moderately Integrated (12/21/2022)   Social Connection and Isolation Panel [NHANES]    Frequency of Communication with Friends and Family: More than three times a week    Frequency of Social Gatherings with Friends and Family: More than three times a week    Attends Religious Services: More than 4 times per year    Active Member of [employer] or Organizations: No    Attends Engineer, structural: Never    Marital Status: Married    Tobacco Counseling Counseling given: Yes Tobacco comments: Never smoked    Clinical Intake:  Pre-visit preparation completed: Yes  Pain : No/denies pain Pain Score: 0-No pain     BMI - recorded: 27.46 Nutritional Status: BMI 25 -29 Overweight Nutritional Risks: None Diabetes: No  How often do you need to have someone help you when you read instructions, pamphlets, or other written materials from your doctor or pharmacy?: 1 - Never  Interpreter Needed?: No  Information entered by ::  , CMA   Activities of Daily Living    12/21/2022    3:42 PM  In your present state of health, do you have any difficulty performing the following activities:  Hearing? 0  Vision? 0  Difficulty concentrating or making decisions? 0  Walking or climbing stairs? 1  Comment uses cane  Dressing or bathing? 0  Doing errands, shopping? 0  Preparing Food and eating ?  N  Using the Toilet? N  In the past six months, have you accidently leaked urine? N  Do you have problems with loss of bowel control? N  Managing your Medications? N  Managing your Finances? N  Housekeeping or managing your Housekeeping? N    Patient Care Team: Gilmore Laroche, FNP as PCP - General (Family Medicine) Doreatha Massed, MD as Medical Oncologist (Oncology) Mickie Bail, RN as Oncology Nurse Navigator (Oncology) Lanelle Bal, DO as Consulting Physician (Internal Medicine) Randa Spike Kelton Pillar, LCSW as [facility] Care  Management (Licensed Clinical Social Worker)  Indicate any recent Medical Services you may have received from other than [facility] providers in the past year (date may be approximate).     Assessment:   This is a routine wellness examination for [facility].  Hearing/Vision screen Hearing Screening - Comments:: Patient denies any hearing difficulties.    Dietary issues and exercise activities discussed:     Goals Addressed             This Visit's Progress    Patient Stated       To work on obtaining better balance. Has had several falls.        Depression Screen    12/21/2022    3:35 PM 12/19/2022   11:35 AM 11/15/2022   10:40 AM 10/24/2022   10:10 AM 09/30/2022    8:44 AM 06/23/2022   10:08 AM 03/29/2022    1:08 PM  PHQ 2/9 Scores  PHQ - 2 Score 3 3 0 1 2 2 2   PHQ- 9 Score 9 9 0 7 6 8 8     Fall Risk    12/21/2022    3:41 PM 12/19/2022   11:35 AM 11/15/2022   10:40 AM 10/24/2022   10:10 AM 09/30/2022    8:44 AM  Fall Risk   Falls in the past year? 1 1 0 0 0  Number falls in past yr: 1 1 0 0 0  Injury with Fall? 0 1 0 0 0  Risk for fall due to : History of fall(s);Impaired balance/gait;Orthopedic patient Other (Comment) No Fall Risks No Fall Risks No Fall Risks  Follow up Education provided;Falls prevention discussed;Falls evaluation completed Falls evaluation completed Falls evaluation completed Falls evaluation completed Falls evaluation completed    MEDICARE RISK AT HOME:  Medicare Risk at Home - 12/21/22 1540     Any stairs in or around the home? No    If so, are there any without handrails? No    Home free of loose throw rugs in walkways, pet beds, electrical cords, etc? Yes    Adequate lighting in your home to reduce risk of falls? Yes    Life alert? No    Use of a cane, walker or w/c? Yes    Grab bars in the bathroom? No    Shower chair or bench in shower? Yes    Elevated toilet seat or a handicapped toilet? Yes             TIMED UP AND GO:  Was the test  performed?  No    Cognitive Function:        12/21/2022    3:31 PM 11/15/2021    9:42 AM 11/11/2020   11:42 AM 11/11/2020    9:31 AM  6CIT Screen  What Year? 0 points 0 points 0 points 0 points  What month? 0 points 0 points 0 points 0 points  What time? 0 points 0 points  0 points 0 points  Count back from 20 0 points 0 points 0 points 0 points  Months in reverse 0 points 0 points 0 points 0 points  Repeat phrase 0 points 0 points 0 points 0 points  Total Score 0 points 0 points 0 points 0 points    Immunizations Immunization History  Administered Date(s) Administered   Fluad Quad(high Dose 65+) 02/22/2020   Influenza, High Dose Seasonal PF 01/23/2019   Influenza,inj,quad, With Preservative 02/09/2018   Influenza-Unspecified 03/04/2021   Moderna Sars-Covid-2 Vaccination 06/13/2019, 07/10/2019, 06/09/2020, 03/04/2021   Pneumococcal Conjugate-13 12/14/2017   Pneumococcal Polysaccharide-23 07/15/2020   Tdap 07/15/2020   Zoster Recombinant(Shingrix) 12/28/2020, 04/02/2021    TDAP status: Up to date  Flu Vaccine status: Up to date  Pneumococcal vaccine status: Up to date  Covid-19 vaccine status: Information provided on how to obtain vaccines.   Qualifies for Shingles Vaccine? Yes   Zostavax completed Yes   Shingrix Completed?: Yes  Screening Tests Health Maintenance  Topic Date Due   COVID-19 Vaccine (5 - 2023-24 season) 01/21/2022   MAMMOGRAM  02/06/2022   Medicare Annual Wellness (AWV)  11/16/2022   INFLUENZA VACCINE  12/22/2022   Colonoscopy  05/23/2027   DTaP/Tdap/Td (2 - Td or Tdap) 07/15/2030   Pneumonia Vaccine 70+ Years old  Completed   DEXA SCAN  Completed   Hepatitis C Screening  Completed   Zoster Vaccines- Shingrix  Completed   HPV VACCINES  Aged Out    Health Maintenance  Health Maintenance Due  Topic Date Due   COVID-19 Vaccine (5 - 2023-24 season) 01/21/2022   MAMMOGRAM  02/06/2022   Medicare Annual Wellness (AWV)  11/16/2022     Colorectal cancer screening: Type of screening: Colonoscopy. Completed 05/22/2017. Repeat every 10 years  Mammogram status: Ordered 12/21/2022. Pt provided with contact info and advised to call to schedule appt.   Bone Density status: Completed 01/22/2020. Results reflect: Bone density results: NORMAL. Repeat every 5 years.  Lung Cancer Screening: (Low Dose CT Chest recommended if Age 74-80 years, 20 pack-year currently smoking OR have quit w/in 15years.) does not qualify.    Additional Screening:  Hepatitis C Screening: does not qualify; Completed 06/19/2019  Vision Screening: Recommended annual ophthalmology exams for early detection of glaucoma and other disorders of the eye. Is the patient up to date with their annual eye exam?  Yes  Who is the provider or what is the name of the office in which the patient attends annual eye exams?   Dental Screening: Recommended annual dental exams for proper oral hygiene  Diabetic Foot Exam: n/a  Community Resource Referral / Chronic Care Management: CRR required this visit?  No   CCM required this visit?  No     Plan:     I have personally reviewed and noted the following in the patient's chart:   Medical and social history Use of alcohol, tobacco or illicit drugs  Current medications and supplements including opioid prescriptions. Patient is currently taking opioid prescriptions. Information provided to patient regarding non-opioid alternatives. Patient advised to discuss non-opioid treatment plan with their provider. Functional ability and status Nutritional status Physical activity Advanced directives List of other physicians Hospitalizations, surgeries, and ER visits in previous 12 months Vitals Screenings to include cognitive, depression, and falls Referrals and appointments  In addition, I have reviewed and discussed with patient certain preventive protocols, quality metrics, and best practice recommendations. A written  personalized care plan for preventive services as well as general preventive  health recommendations were provided to patient.     Jordan Hawks , CMA   12/21/2022   After Visit Summary: (MyChart) Due to this being a telephonic visit, the after visit summary with patients personalized plan was offered to patient via MyChart   Nurse Notes: Patient has an opioid on her medication list. She states she isn't taken it but has it on hand.

## 2022-12-21 NOTE — Patient Instructions (Signed)
Courtney Rogers , Thank you for taking time to come for your Medicare Wellness Visit. I appreciate your ongoing commitment to your health goals. Please review the following plan we discussed and let me know if I can assist you in the future.   These are the goals we discussed:  Goals       DIET - INCREASE WATER INTAKE      Manage Anxiety issues. Manage Caregiver Stress issues (pt-stated)      Time Frame:  Short Term Goal Priority; Medium  Progress;  On Track  Start Date:  07/23/21   Expected End Date:  02/14/22      Follow up Date:  01/07/22 at 1:00 PM    Manage Anxiety issues. Manage Caregiver Stress issues  Patient Self Care Activities:  Attends all scheduled provider appointments Performs ADL's independently  Patient Coping Strengths:  Family Friends  Patient Self Care Deficits:  Walking challenges Caregiver stress issues Sleeping challenges  Patient Goals:  - spend time or talk with others at least 2 to 3 times per week - practice relaxation or meditation daily - keep a calendar with appointment dates  Follow Up Plan: LCSW to call client on 01/07/22 at 1;00 PM       Patient Stated      Will get another booster covid vaccine since over the age of 72      Patient Stated      To work on obtaining better balance. Has had several falls.         This is a list of the screening recommended for you and due dates:  Health Maintenance  Topic Date Due   COVID-19 Vaccine (5 - 2023-24 season) 01/21/2022   Mammogram  02/06/2022   Flu Shot  12/22/2022   Medicare Annual Wellness Visit  12/21/2023   Colon Cancer Screening  05/23/2027   DTaP/Tdap/Td vaccine (2 - Td or Tdap) 07/15/2030   Pneumonia Vaccine  Completed   DEXA scan (bone density measurement)  Completed   Hepatitis C Screening  Completed   Zoster (Shingles) Vaccine  Completed   HPV Vaccine  Aged Out    Advanced directives: Advance directive discussed with you today. Even though you declined this today, please  call our office should you change your mind, and we can give you the proper paperwork for you to fill out. Advance care planning is a way to make decisions about medical care that fits your values in case you are ever unable to make these decisions for yourself.  Information on Advanced Care Planning can be found at [facility] Advance Health Care Directives Advance Health Care Directives ([email or web address]    Conditions/risks identified:  You have an order for:  []   2D Mammogram  [x]   3D Mammogram  []   Bone Density   []   Lung Cancer Screening  Please call for appointment:   [facility] [address]iology [place], [place] [address] [phone]  Make sure to wear two-piece clothing.  No lotions powders or deodorants the day of the appointment Make sure to bring picture ID and insurance card.  Bring list of medications you are currently taking including any supplements.   Schedule your [place] screening mammogram through MyChart!   Log into your MyChart account.  Go to 'Visit' (or 'Appointments' if on mobile App) --> Schedule an Appointment  Under 'Select a Reason for Visit' choose the Mammogram Screening option.  Complete the pre-visit questions  and select the time and place that best fits your schedule. Managing Pain Without Opioids Opioids are strong medicines used to treat moderate to severe pain. For some people, especially those who have long-term (chronic) pain, opioids may not be the best choice for pain management due to: Side effects like nausea, constipation, and sleepiness. The risk of addiction (opioid use disorder). The longer you take opioids, the greater your risk of addiction. Pain that lasts for more than 3 months is called chronic pain. Managing chronic pain usually requires more than one approach and is often provided by a team of health care providers working together (multidisciplinary approach). Pain  management may be done at a pain management center or pain clinic. How to manage pain without the use of opioids Use non-opioid medicines Non-opioid medicines for pain may include: Over-the-counter or prescription non-steroidal anti-inflammatory drugs (NSAIDs). These may be the first medicines used for pain. They work well for muscle and bone pain, and they reduce swelling. Acetaminophen. This over-the-counter medicine may work well for milder pain but not swelling. Antidepressants. These may be used to treat chronic pain. A certain type of antidepressant (tricyclics) is often used. These medicines are given in lower doses for pain than when used for depression. Anticonvulsants. These are usually used to treat seizures but may also reduce nerve (neuropathic) pain. Muscle relaxants. These relieve pain caused by sudden muscle tightening (spasms). You may also use a pain medicine that is applied to the skin as a patch, cream, or gel (topical analgesic), such as a numbing medicine. These may cause fewer side effects than medicines taken by mouth. Do certain therapies as directed Some therapies can help with pain management. They include: Physical therapy. You will do exercises to gain strength and flexibility. A physical therapist may teach you exercises to move and stretch parts of your body that are weak, stiff, or painful. You can learn these exercises at physical therapy visits and practice them at home. Physical therapy may also involve: Massage. Heat wraps or applying heat or cold to affected areas. Electrical signals that interrupt pain signals (transcutaneous electrical nerve stimulation, TENS). Weak lasers that reduce pain and swelling (low-level laser therapy). Signals from your body that help you learn to regulate pain (biofeedback). Occupational therapy. This helps you to learn ways to function at home and work with less pain. Recreational therapy. This involves trying new activities or  hobbies, such as a physical activity or drawing. Mental health therapy, including: Cognitive behavioral therapy (CBT). This helps you learn coping skills for dealing with pain. Acceptance and commitment therapy (ACT) to change the way you think and react to pain. Relaxation therapies, including muscle relaxation exercises and mindfulness-based stress reduction. Pain management counseling. This may be individual, family, or group counseling.  Receive medical treatments Medical treatments for pain management include: Nerve block injections. These may include a pain blocker and anti-inflammatory medicines. You may have injections: Near the spine to relieve chronic back or neck pain. Into joints to relieve back or joint pain. Into nerve areas that supply a painful area to relieve body pain. Into muscles (trigger point injections) to relieve some painful muscle conditions. A medical device placed near your spine to help block pain signals and relieve nerve pain or chronic back pain (spinal cord stimulation device). Acupuncture. Follow these instructions at home Medicines Take over-the-counter and prescription medicines only as told by your health care provider. If you are taking pain medicine, ask your health care providers about possible side effects  to watch out for. Do not drive or use heavy machinery while taking prescription opioid pain medicine. Lifestyle  Do not use drugs or alcohol to reduce pain. If you drink alcohol, limit how much you have to: 0-1 drink a day for women who are not pregnant. 0-2 drinks a day for men. Know how much alcohol is in a drink. In the [place] one drink equals one 12 oz bottle of beer (355 mL), one 5 oz glass of wine (148 mL), or one 1 oz glass of hard liquor (44 mL). Do not use any products that contain nicotine or tobacco. These products include cigarettes, chewing tobacco, and vaping devices, such as e-cigarettes. If you need help quitting, ask your health  care provider. Eat a healthy diet and maintain a healthy weight. Poor diet and excess weight may make pain worse. Eat foods that are high in fiber. These include fresh fruits and vegetables, whole grains, and beans. Limit foods that are high in fat and processed sugars, such as fried and sweet foods. Exercise regularly. Exercise lowers stress and may help relieve pain. Ask your health care provider what activities and exercises are safe for you. If your health care provider approves, join an exercise class that combines movement and stress reduction. Examples include yoga and tai chi. Get enough sleep. Lack of sleep may make pain worse. Lower stress as much as possible. Practice stress reduction techniques as told by your therapist. General instructions Work with all your pain management providers to find the treatments that work best for you. You are an important member of your pain management team. There are many things you can do to reduce pain on your own. Consider joining an online or in-person support group for people who have chronic pain. Keep all follow-up visits. This is important. Where to find more information You can find more information about managing pain without opioids from: American Academy of Pain Medicine: painmed.org Institute for Chronic Pain: instituteforchronicpain.org American Chronic Pain Association: theacpa.org Contact a health care provider if: You have side effects from pain medicine. Your pain gets worse or does not get better with treatments or home therapy. You are struggling with anxiety or depression. Summary Many types of pain can be managed without opioids. Chronic pain may respond better to pain management without opioids. Pain is best managed when you and a team of health care providers work together. Pain management without opioids may include non-opioid medicines, medical treatments, physical therapy, mental health therapy, and lifestyle  changes. Tell your health care providers if your pain gets worse or is not being managed well enough. This information is not intended to replace advice given to you by your health care provider. Make sure you discuss any questions you have with your health care provider. Document Revised: 08/19/2020 Document Reviewed: 08/19/2020 Elsevier Patient Education  2024 Elsevier Inc.    Next appointment: VIRTUAL/TELEPHONE APPOINTMENT Follow up in one year for your annual wellness visit February 07, 2024 at 3:00pm telephone visit    Preventive Care 65 Years and Older, Female Preventive care refers to lifestyle choices and visits with your health care provider that can promote health and wellness. What does preventive care include? A yearly physical exam. This is also called an annual well check. Dental exams once or twice a year. Routine eye exams. Ask your health care provider how often you should have your eyes checked. Personal lifestyle choices, including: Daily care of your teeth and gums. Regular physical activity. Eating a healthy diet.  Avoiding tobacco and drug use. Limiting alcohol use. Practicing safe sex. Taking low-dose aspirin every day. Taking vitamin and mineral supplements as recommended by your health care provider. What happens during an annual well check? The services and screenings done by your health care provider during your annual well check will depend on your age, overall health, lifestyle risk factors, and family history of disease. Counseling  Your health care provider may ask you questions about your: Alcohol use. Tobacco use. Drug use. Emotional well-being. Home and relationship well-being. Sexual activity. Eating habits. History of falls. Memory and ability to understand (cognition). Work and work Astronomer. Reproductive health. Screening  You may have the following tests or measurements: Height, weight, and BMI. Blood pressure. Lipid and  cholesterol levels. These may be checked every 5 years, or more frequently if you are over 30 years old. Skin check. Lung cancer screening. You may have this screening every year starting at age 110 if you have a 30-pack-year history of smoking and currently smoke or have quit within the past 15 years. Fecal occult blood test (FOBT) of the stool. You may have this test every year starting at age 15. Flexible sigmoidoscopy or colonoscopy. You may have a sigmoidoscopy every 5 years or a colonoscopy every 10 years starting at age 33. Hepatitis C blood test. Hepatitis B blood test. Sexually transmitted disease (STD) testing. Diabetes screening. This is done by checking your blood sugar (glucose) after you have not eaten for a while (fasting). You may have this done every 1-3 years. Bone density scan. This is done to screen for osteoporosis. You may have this done starting at age 35. Mammogram. This may be done every 1-2 years. Talk to your health care provider about how often you should have regular mammograms. Talk with your health care provider about your test results, treatment options, and if necessary, the need for more tests. Vaccines  Your health care provider may recommend certain vaccines, such as: Influenza vaccine. This is recommended every year. Tetanus, diphtheria, and acellular pertussis (Tdap, Td) vaccine. You may need a Td booster every 10 years. Zoster vaccine. You may need this after age 40. Pneumococcal 13-valent conjugate (PCV13) vaccine. One dose is recommended after age 14. Pneumococcal polysaccharide (PPSV23) vaccine. One dose is recommended after age 84. Talk to your health care provider about which screenings and vaccines you need and how often you need them. This information is not intended to replace advice given to you by your health care provider. Make sure you discuss any questions you have with your health care provider. Document Released: 06/05/2015 Document Revised:  01/27/2016 Document Reviewed: 03/10/2015 Elsevier Interactive Patient Education  2017 [employer].  Fall Prevention in the Home Falls can cause injuries. They can happen to people of all ages. There are many things you can do to make your home safe and to help prevent falls. What can I do on the outside of my home? Regularly fix the edges of walkways and driveways and fix any cracks. Remove anything that might make you trip as you walk through a door, such as a raised step or threshold. Trim any bushes or trees on the path to your home. Use bright outdoor lighting. Clear any walking paths of anything that might make someone trip, such as rocks or tools. Regularly check to see if handrails are loose or broken. Make sure that both sides of any steps have handrails. Any raised decks and porches should have guardrails on the edges. Have any leaves,  snow, or ice cleared regularly. Use sand or salt on walking paths during winter. Clean up any spills in your garage right away. This includes oil or grease spills. What can I do in the bathroom? Use night lights. Install grab bars by the toilet and in the tub and shower. Do not use towel bars as grab bars. Use non-skid mats or decals in the tub or shower. If you need to sit down in the shower, use a plastic, non-slip stool. Keep the floor dry. Clean up any water that spills on the floor as soon as it happens. Remove soap buildup in the tub or shower regularly. Attach bath mats securely with double-sided non-slip rug tape. Do not have throw rugs and other things on the floor that can make you trip. What can I do in the bedroom? Use night lights. Make sure that you have a light by your bed that is easy to reach. Do not use any sheets or blankets that are too big for your bed. They should not hang down onto the floor. Have a firm chair that has side arms. You can use this for support while you get dressed. Do not have throw rugs and other things  on the floor that can make you trip. What can I do in the kitchen? Clean up any spills right away. Avoid walking on wet floors. Keep items that you use a lot in easy-to-reach places. If you need to reach something above you, use a strong step stool that has a grab bar. Keep electrical cords out of the way. Do not use floor polish or wax that makes floors slippery. If you must use wax, use non-skid floor wax. Do not have throw rugs and other things on the floor that can make you trip. What can I do with my stairs? Do not leave any items on the stairs. Make sure that there are handrails on both sides of the stairs and use them. Fix handrails that are broken or loose. Make sure that handrails are as long as the stairways. Check any carpeting to make sure that it is firmly attached to the stairs. Fix any carpet that is loose or worn. Avoid having throw rugs at the top or bottom of the stairs. If you do have throw rugs, attach them to the floor with carpet tape. Make sure that you have a light switch at the top of the stairs and the bottom of the stairs. If you do not have them, ask someone to add them for you. What else can I do to help prevent falls? Wear shoes that: Do not have high heels. Have rubber bottoms. Are comfortable and fit you well. Are closed at the toe. Do not wear sandals. If you use a stepladder: Make sure that it is fully opened. Do not climb a closed stepladder. Make sure that both sides of the stepladder are locked into place. Ask someone to hold it for you, if possible. Clearly mark and make sure that you can see: Any grab bars or handrails. First and last steps. Where the edge of each step is. Use tools that help you move around (mobility aids) if they are needed. These include: Canes. Walkers. Scooters. Crutches. Turn on the lights when you go into a dark area. Replace any light bulbs as soon as they burn out. Set up your furniture so you have a clear path. Avoid  moving your furniture around. If any of your floors are uneven, fix them. If there  are any pets around you, be aware of where they are. Review your medicines with your doctor. Some medicines can make you feel dizzy. This can increase your chance of falling. Ask your doctor what other things that you can do to help prevent falls. This information is not intended to replace advice given to you by your health care provider. Make sure you discuss any questions you have with your health care provider. Document Released: 03/05/2009 Document Revised: 10/15/2015 Document Reviewed: 06/13/2014 Elsevier Interactive Patient Education  2017 [employer].

## 2022-12-22 ENCOUNTER — Other Ambulatory Visit: Payer: Self-pay

## 2022-12-22 ENCOUNTER — Inpatient Hospital Stay (HOSPITAL_BASED_OUTPATIENT_CLINIC_OR_DEPARTMENT_OTHER): Payer: Medicare Other | Admitting: Hematology

## 2022-12-22 ENCOUNTER — Inpatient Hospital Stay: Payer: Medicare Other | Attending: Hematology

## 2022-12-22 ENCOUNTER — Inpatient Hospital Stay: Payer: Medicare Other

## 2022-12-22 DIAGNOSIS — Z7901 Long term (current) use of anticoagulants: Secondary | ICD-10-CM | POA: Diagnosis not present

## 2022-12-22 DIAGNOSIS — Z5112 Encounter for antineoplastic immunotherapy: Secondary | ICD-10-CM | POA: Diagnosis not present

## 2022-12-22 DIAGNOSIS — Z79899 Other long term (current) drug therapy: Secondary | ICD-10-CM | POA: Insufficient documentation

## 2022-12-22 DIAGNOSIS — C9000 Multiple myeloma not having achieved remission: Secondary | ICD-10-CM

## 2022-12-22 DIAGNOSIS — Z86711 Personal history of pulmonary embolism: Secondary | ICD-10-CM | POA: Insufficient documentation

## 2022-12-22 DIAGNOSIS — G629 Polyneuropathy, unspecified: Secondary | ICD-10-CM | POA: Insufficient documentation

## 2022-12-22 DIAGNOSIS — D509 Iron deficiency anemia, unspecified: Secondary | ICD-10-CM

## 2022-12-22 LAB — COMPREHENSIVE METABOLIC PANEL
ALT: 23 U/L (ref 0–44)
AST: 25 U/L (ref 15–41)
Albumin: 4.1 g/dL (ref 3.5–5.0)
Alkaline Phosphatase: 42 U/L (ref 38–126)
Anion gap: 9 (ref 5–15)
BUN: 13 mg/dL (ref 8–23)
CO2: 28 mmol/L (ref 22–32)
Calcium: 8.5 mg/dL — ABNORMAL LOW (ref 8.9–10.3)
Chloride: 101 mmol/L (ref 98–111)
Creatinine, Ser: 0.85 mg/dL (ref 0.44–1.00)
GFR, Estimated: >60 mL/min (ref >60)
Glucose, Bld: 102 mg/dL — ABNORMAL HIGH (ref 70–99)
Potassium: 3.2 mmol/L — ABNORMAL LOW (ref 3.5–5.1)
Sodium: 138 mmol/L (ref 135–145)
Total Bilirubin: 0.8 mg/dL (ref 0.3–1.2)
Total Protein: 6.5 g/dL (ref 6.5–8.1)

## 2022-12-22 LAB — CBC WITH DIFFERENTIAL/PLATELET
Abs Immature Granulocytes: 0 10*3/uL (ref 0.00–0.07)
Basophils Absolute: 0 10*3/uL (ref 0.0–0.1)
Basophils Relative: 1 %
Eosinophils Absolute: 0.3 10*3/uL (ref 0.0–0.5)
Eosinophils Relative: 10 %
HCT: 34.5 % — ABNORMAL LOW (ref 36.0–46.0)
Hemoglobin: 11.6 g/dL — ABNORMAL LOW (ref 12.0–15.0)
Immature Granulocytes: 0 %
Lymphocytes Relative: 28 %
Lymphs Abs: 0.9 10*3/uL (ref 0.7–4.0)
MCH: 34.5 pg — ABNORMAL HIGH (ref 26.0–34.0)
MCHC: 33.6 g/dL (ref 30.0–36.0)
MCV: 102.7 fL — ABNORMAL HIGH (ref 80.0–100.0)
Monocytes Absolute: 0.4 10*3/uL (ref 0.1–1.0)
Monocytes Relative: 14 %
Neutro Abs: 1.5 10*3/uL — ABNORMAL LOW (ref 1.7–7.7)
Neutrophils Relative %: 47 %
Platelets: 86 10*3/uL — ABNORMAL LOW (ref 150–400)
RBC: 3.36 MIL/uL — ABNORMAL LOW (ref 3.87–5.11)
RDW: 16.8 % — ABNORMAL HIGH (ref 11.5–15.5)
WBC: 3.1 10*3/uL — ABNORMAL LOW (ref 4.0–10.5)
nRBC: 0 % (ref 0.0–0.2)

## 2022-12-22 MED ORDER — DENOSUMAB 120 MG/1.7ML ~~LOC~~ SOLN
120.0000 mg | Freq: Once | SUBCUTANEOUS | Status: AC
  Administered 2022-12-22: 120 mg via SUBCUTANEOUS
  Filled 2022-12-22: qty 1.7

## 2022-12-22 MED ORDER — BORTEZOMIB CHEMO SQ INJECTION 3.5 MG (2.5MG/ML)
0.7000 mg/m2 | Freq: Once | INTRAMUSCULAR | Status: AC
  Administered 2022-12-22: 1.25 mg via SUBCUTANEOUS
  Filled 2022-12-22: qty 0.5

## 2022-12-22 NOTE — Progress Notes (Signed)
Ok to restart [place] - dental work has been completed and adequate healing time has occurred.  Ok to proceed with Calcium 8.5 today.   Adjusting Velcade dose to 0.7 mg/m2 due to neuropathy.  V.O. Dr Carilyn Goodpasture, PharmD

## 2022-12-22 NOTE — Progress Notes (Signed)
Patient presents today for Velcade and Xgeva injections per providers order.  Vital signs and labs reviewed by MD.  Message received from Chapman Moss RN/Dr. Ellin Saba patient okay for Velcade and will restart Xgeva today.  Patient is taking Calcium and vitamin D supplements at home, has no upcoming dental work and no jaw pain.  Patient instructed to increase Calcium to TID per Dr. Ellin Saba.  Stable during administration without incident; injection sites WNL; see MAR for injection details.  Patient tolerated procedure well and without incident.  No questions or complaints noted at this time.

## 2022-12-22 NOTE — Progress Notes (Signed)
Patient is taking Revlimid as prescribed. She has not missed any doses and reports no side effects at this time.    Patient has been examined by Dr. Ellin Saba. Vital signs and labs have been reviewed by MD - ANC, Creatinine, LFTs, hemoglobin, and platelets (86,000) are within treatment parameters per M.D. - pt may proceed with treatment.  Primary RN and pharmacy notified.

## 2022-12-22 NOTE — Progress Notes (Signed)
[facility] [address], [place] [address]    Clinic Day:  12/22/2022  Referring physician: Gilmore Laroche, FNP  Patient Care Team: Gilmore Laroche, FNP as PCP - General (Family Medicine) Doreatha Massed, MD as Medical Oncologist (Oncology) Mickie Bail, RN as Oncology Nurse Navigator (Oncology) Lanelle Bal, DO as Consulting Physician (Internal Medicine) Randa Spike, Kelton Pillar, LCSW as [facility] Care Management (Licensed Clinical Social Worker)   ASSESSMENT & PLAN:   Assessment: 1.  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  Increased number of atypical plasma cells present 36% of all cell lines.  Plasma cells are kappa light chain restricted. -Unfortunately Courtney Rogers specimen has reached cytogenetics and FISH lab week later due to bad weather and no viable plasma cells. -PET scan on 07/09/2019 showed no findings of active myeloma. -24-hour urine shows total protein 59 mg.  Urine immunofixation shows IgA kappa monoclonal protein.  LDH is 184. -Based on [facility] criteria she has high risk with about 45-50% probability of progression to myeloma in the next 2 years. -CTAP on 01/07/2020 shows acute superior endplate burst fracture of L1.  Mild associated paravertebral soft tissue thickening.  No other bone abnormalities. -Bone density test on 02/07/2020 shows T score -0.5. - Bone marrow biopsy on 07/12/2021: Hypercellular with increased number of atypical plasma cells representing 30% of cells, displaying kappa light chain restriction. - FISH panel: Gain of 1 q. (high risk), T p53 deletion (high risk), monosomy 13 (standard risk) - Cytogenetics: Complex karyotype. - PET scan (06/24/2021): New hypermetabolic bone lesions involving left anterior iliac crest, left mid humerus, right superior pubic ramus, bilateral mid femurs, left calvarium.  Largest lytic lesion in  the left iliac crest measuring 2.9 x 2.3 cm. - 2D echo on 07/29/2021: EF 50-55%.  There is distal septal and inferior apical hypokinesis.  (Carfilzomib based regimen was put on hold due to echo findings) - Dara VRD 6 cycles from 08/10/2021 through 11/30/2021 - Myeloma panel 12/07/2021, DIRA test was negative, M spike 0.2 g and light chain ratio normal. - Maintenance Velcade every 2 weeks and Revlimid 3 weeks on 1 week off Started on 12/20/2021    2.  Social/family history: - She uses walker to ambulate since Courtney Rogers right knee replacement leading to stiffness.  She lives with older sister and husband who has dementia. - She is independent of ADLs and IADLs.  She also drives     Plan: 1.  Stage II IgA kappa multiple myeloma, high risk with del 17p: - She is continuing to tolerate Velcade and Revlimid well.  She denies any infections. - She reportedly fell 3 weeks ago as she tripped over the walker in the kitchen. - Myeloma labs from 12/07/2022: M spike not detected.  Free light chain ratio and immunofixation were normal. - Labs today: Platelet count 86, white count 3.1 with ANC of 1.5.  Hemoglobin 11.6.  Creatinine and liver functions are normal. - Due to worsening peripheral neuropathy, recommend decreasing Velcade dose to 0.7 mg/m.  Continue Revlimid 10 mg 3 weeks on/1 week off.  RTC 2 months for follow-up.   2.  Peripheral neuropathy: - She has on and off numbness in the fingertips and toes. - She occasionally drops small objects like coins. - She was seen by neurology in [place].   3.  Pulmonary embolism (2015): - Continue Coumadin indefinitely.  No bleeding issues.  4.  ID prophylaxis: - Continue acid fluid twice daily.  5.  Myeloma bone disease: - Last dose of denosumab was on 05/25/2022.  She had completed dental work. - She will restart denosumab.  Continue calcium supplements.  6.  Right knee pain: - Continue hydrocodone 5/325 as needed.    Orders Placed This Encounter  Procedures    CBC with Differential    Standing Status:   Standing    Number of Occurrences:   10    Standing Expiration Date:   12/22/2023   Comprehensive metabolic panel    Standing Status:   Standing    Number of Occurrences:   10    Standing Expiration Date:   12/22/2023   Magnesium    Standing Status:   Standing    Number of Occurrences:   10    Standing Expiration Date:   12/22/2023   Kappa/lambda light chains    Standing Status:   Future    Standing Expiration Date:   12/22/2023   Immunofixation electrophoresis    Standing Status:   Future    Standing Expiration Date:   12/22/2023   Protein electrophoresis, serum    Standing Status:   Future    Standing Expiration Date:   12/22/2023     Alben Deeds Teague,acting as a scribe for Doreatha Massed, MD.,have documented all relevant documentation on the behalf of Doreatha Massed, MD,as directed by  Doreatha Massed, MD while in the presence of Doreatha Massed, MD.  I, Doreatha Massed MD, have reviewed the above documentation for accuracy and completeness, and I agree with the above.    Doreatha Massed, MD   8/1/20246:56 PM  CHIEF COMPLAINT:   Diagnosis: multiple myeloma    Cancer Staging  No matching staging information was found for the patient.    Prior Therapy: Dara VRD 6 cycles completed on 11/30/2021   Current Therapy:  Maintenance Velcade every 2 weeks, and Revlimid 3 weeks on/1 week off    HISTORY OF PRESENT ILLNESS:   Oncology History  IgA myeloma (HCC)  05/06/2021 Initial Diagnosis   IgA myeloma (HCC)   08/10/2021 - 01/19/2022 Chemotherapy   Patient is on Treatment Plan : MYELOMA NEWLY DIAGNOSED TRANSPLANT CANDIDATE DaraVRd (Daratumumab SQ) q21d x 6 Cycles (Induction/Consolidation)     08/10/2021 -  Chemotherapy   Patient is on Treatment Plan : MYELOMA NEWLY DIAGNOSED TRANSPLANT CANDIDATE DaraVRd (Daratumumab SQ) q21d x 6 Cycles (Induction/Consolidation)        INTERVAL HISTORY:   Courtney Rogers is a 72  y.o. female presenting to clinic today for follow up of multiple myeloma. She was last seen by me on 10/26/22.  Since Courtney Rogers last visit, she was seen in the ED on 11/14/22 for dizziness. She had mild hypokalemia at 2.9 and elevated creatinine at 1.26. Chest X-rays, urinalysis and hemoglobin were normal. Courtney Rogers condition stabilized with a head cocktail and she was discharged the same day. She was also admitted to the ED on 12/10/22 for a fall. CT of the head, hip X-ray, and knee X-ray found no abnormalities. X-ray of the spine found degenerative disc disease at L4-5 and L5-S1. She was given Norco prior to coming to the ED which improved pain from fall. She was ambulatory and stable while in the ED and was discharged the same day.   Today, she states that she is doing well overall. Courtney Rogers appetite level is at 75%. Courtney Rogers energy level is at 40%.  PAST MEDICAL HISTORY:   Past Medical History: Past Medical History:  Diagnosis Date   Acute myocardial infarction [facility]) 2009   CAD/no stent medically managed   Anxiety disorder    CAD (coronary artery disease)    Cancer (HCC)    multiple myeloma   Hyperlipidemia    Hypertension    IBS (irritable bowel syndrome)    Non-ulcer dyspepsia 04/30/2009   Qualifier: Diagnosis of  By: Darrick Penna MD, Sandi L    Overactive bladder    PE (pulmonary embolism) 10/2012   left lung   Presence of permanent cardiac pacemaker    Sleep apnea     Surgical History: Past Surgical History:  Procedure Laterality Date   ABDOMINAL HYSTERECTOMY     BSO secondary to cyst     CARDIAC CATHETERIZATION     COLONOSCOPY  12/2009   Dr. Aleene Davidson, propofol, normal. Next TCS 12/2019   COLONOSCOPY N/A 05/22/2017   Examined portion ileum was normal, significant looping of colon, external hemorrhoids and rectal bleeding due to internal hemorrhoids, mild diverticulosis procedure: COLONOSCOPY;  Surgeon: West Bali, MD;  [facility];  Service: Endoscopy;  Laterality: N/A;  10:30am    ESOPHAGOGASTRODUODENOSCOPY  05/11/2009   schatzki ring/small hiatal hernia/path:gastritis   ESOPHAGOGASTRODUODENOSCOPY N/A 05/22/2017   Multiple gastric polyps with biopsy benign fundic gland polyp, small bowel biopsy negative for celiac, gastric biopsy with mild gastritis but no H. pylori procedure: ESOPHAGOGASTRODUODENOSCOPY (EGD);  Surgeon: West Bali, MD;  [facility]: [facility];  Service: Endoscopy;  Laterality: N/A;   ESOPHAGOGASTRODUODENOSCOPY (EGD) WITH ESOPHAGEAL DILATION N/A 04/01/2013   [id number] at the gastroesophageal juction/multiple small polyps/mild gastritis   GIVENS CAPSULE STUDY N/A 06/07/2017   Frequent gastric erosions, normal small bowel mucosa.  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    Social History: Social History   Socioeconomic History   Marital status: Married    Spouse name: Doctor, general practice   Number of children: 3   Years of education: 16   Highest education level: Not on file  Occupational History   Not on file  Tobacco Use   Smoking status: Never    Passive exposure: Never   Smokeless tobacco: Never   Tobacco comments:    Never smoked   Vaping Use   Vaping status: Never Used  Substance and Sexual Activity   Alcohol use: Not Currently    Comment: occ wine   Drug use: No   Sexual activity: Not Currently  Other Topics Concern   Not on file  Social History Narrative      Lives with husband-51 years 950 in Aug 2021   Daughter is close by, 2 sons live further away   One IN [place],ONE IN [place], ONE BESIDE Courtney Rogers.        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   Enjoys: reading, young adult      Diet: eats all food groups    Caffeine: coffee 1, tea daily soda-1 daily   Water: 1-2 cups      Wears seat belt    Does not  use phone while driving   Smoke detectors at home    Licensed conveyancer -locked up      Left handed   One story home   Drinks caffeine   Social Determinants of  Health   Financial Resource Strain: Low Risk  (12/21/2022)   Overall Financial Resource Strain (CARDIA)    Difficulty of Paying Living Expenses: Not hard at all  Food Insecurity: No Food Insecurity (12/21/2022)   Hunger Vital Sign    Worried About Running Out of Food in the Last Year: Never true    Ran Out of Food in the Last Year: Never true  Transportation Needs: No Transportation Needs (12/21/2022)   PRAPARE - Administrator, Civil Service (Medical): No    Lack of Transportation (Non-Medical): No  Physical Activity: Inactive (12/21/2022)   Exercise Vital Sign    Days of Exercise per Week: 0 days    Minutes of Exercise per Session: 0 min  Stress: Stress Concern Present (12/21/2022)   [employer] of Occupational Health - Occupational Stress Questionnaire    Feeling of Stress : To some extent  Social Connections: Moderately Integrated (12/21/2022)   Social Connection and Isolation Panel [NHANES]    Frequency of Communication with Friends and Family: More than three times a week    Frequency of Social Gatherings with Friends and Family: More than three times a week    Attends Religious Services: More than 4 times per year    Active Member of [employer] or Organizations: No    Attends Banker Meetings: Never    Marital Status: Married  Catering manager Violence: Not At Risk (12/21/2022)   Humiliation, Afraid, Rape, and Kick questionnaire    Fear of Current or Ex-Partner: No    Emotionally Abused: No    Physically Abused: No    Sexually Abused: No    Family History: Family History  Problem Relation Age of Onset   Colon polyps Neg Hx    Colon cancer Neg Hx     Current Medications:  Current Outpatient Medications:    acetaminophen (TYLENOL) 500 MG tablet, Take 1,000 mg by mouth 2  (two) times daily as needed for moderate pain or headache., Disp: , Rfl:    acyclovir (ZOVIRAX) 400 MG tablet, TAKE ONE TABLET BY MOUTH TWICE DAILY, Disp: 60 tablet, Rfl: 6   albuterol (VENTOLIN HFA) 108 (90 Base) MCG/ACT inhaler, USE 2 PUFFS ONE TO TWO TIMES A DAY., Disp: 18 g, Rfl: 5   amLODipine (NORVASC) 10 MG tablet, TAKE ONE TABLET BY MOUTH ONCE DAILY, Disp: 90 tablet, Rfl: 0   ascorbic acid (VITAMIN C) 500 MG tablet, Take 500 mg by mouth daily., Disp: , Rfl:    aspirin EC 81 MG tablet, Take 81 mg by mouth at bedtime., Disp: , Rfl:    B Complex Vitamins (VITAMIN B COMPLEX) TABS, Take 1 tablet by mouth daily., Disp: , Rfl:    B Complex-Folic Acid (B COMPLEX VITAMINS, W/ FA,) CAPS, Take 1 capsule by mouth daily., Disp: , Rfl:    bortezomib SQ (VELCADE) 3.5 MG injection, Inject into the skin once., Disp: , Rfl:    Calcium Carbonate-Vitamin D (CALCIUM 600+D PO), Take 1 tablet by mouth 2 (two) times daily., Disp: , Rfl:    chlorthalidone (HYGROTON) 25 MG tablet, Take 25 mg by mouth daily., Disp: , Rfl:    cholecalciferol (VITAMIN D3) 10 MCG (400 UNIT) TABS tablet, Take 400 Units by mouth daily., Disp: , Rfl:    cholecalciferol 25 MCG (1000 UT) tablet, Take by mouth., Disp: , Rfl:    cyanocobalamin (VITAMIN B12) 1000 MCG tablet, Take by mouth., Disp: , Rfl:    cyclobenzaprine (FLEXERIL) 10 MG tablet, TAKE ONE-HALF  TABLET BY MOUTH TWICE DAILY AS NEEDED FOR MUSCLE SPASMS, Disp: 20 tablet, Rfl: 0   Daratumumab-Hyaluronidase-fihj (DARZALEX FASPRO [place]), Inject into the skin., Disp: , Rfl:    diclofenac Sodium (VOLTAREN) 1 % GEL, Apply 4 g topically 4 (four) times daily., Disp: 50 g, Rfl: 0   fluticasone (FLONASE) 50 MCG/ACT nasal spray, USE 2 SPRAYS IN EACH NOSTRIL ONCE DAILY, Disp: 16 g, Rfl: 0   fluticasone-salmeterol (ADVAIR) 250-50 MCG/ACT AEPB, Inhale into the lungs., Disp: , Rfl:    folic acid (FOLVITE) 1 MG tablet, TAKE 1 TABLET BY MOUTH ONCE DAILY., Disp: 30 tablet, Rfl: 11   gabapentin  (NEURONTIN) 100 MG capsule, Take 100 mg by mouth 3 (three) times daily., Disp: , Rfl:    hydrochlorothiazide (MICROZIDE) 12.5 MG capsule, Take 12.5 mg by mouth daily., Disp: , Rfl:    HYDROcodone-acetaminophen (NORCO/VICODIN) 5-325 MG tablet, TAKE ONE TABLET BY MOUTH EVERY 8 HOURS AS NEEDED FOR MODERATE PAIN, Disp: 60 tablet, Rfl: 0   Hyoscyamine Sulfate SL 0.125 MG SUBL, , Disp: , Rfl:    lenalidomide (REVLIMID) 10 MG capsule, Take 1 capsule (10 mg total) by mouth daily. 21 days on, 7 days off every 28 days, Disp: 21 capsule, Rfl: 0   lidocaine (HM LIDOCAINE PATCH) 4 %, Place 1 patch onto the skin daily., Disp: 14 patch, Rfl: 0   lidocaine (XYLOCAINE) 2 % solution, Take 2 teaspoons before meals and at bedtime as needed. May repeat every 4 hours for a maximum dose of 8 per day., Disp: 300 mL, Rfl: 0   Lidocaine HCl (XOLIDO) 2 % CREA, Apply 1 application topically daily as needed (arthritisis)., Disp: , Rfl:    loperamide (IMODIUM) 2 MG capsule, Take 4 mg by mouth as needed. Take 2 tablets after the first loose stool and then 1 tablet after each loose stool.  Do not exceed 8 capsules within 24 hours., Disp: , Rfl:    losartan (COZAAR) 100 MG tablet, , Disp: , Rfl:    magnesium oxide (MAG-OX) 400 (240 Mg) MG tablet, Take 1 tablet (400 mg total) by mouth 2 (two) times daily., Disp: 60 tablet, Rfl: 4   metoprolol tartrate (LOPRESSOR) 50 MG tablet, Take 1 tablet (50 mg total) by mouth 2 (two) times daily. (Patient taking differently: Take 25 mg by mouth 2 (two) times daily.), Disp: 180 tablet, Rfl: 1   montelukast (SINGULAIR) 10 MG tablet, Take 10 mg by mouth daily., Disp: , Rfl:    montelukast (SINGULAIR) 10 MG tablet, Take 1 tablet (10 mg total) by mouth at bedtime., Disp: 30 tablet, Rfl: 3   MYRBETRIQ 50 MG TB24 tablet, TAKE ONE TABLET BY MOUTH ONCE DAILY, Disp: 30 tablet, Rfl: 1   pantoprazole (PROTONIX) 40 MG tablet, Take 40 mg by mouth daily., Disp: , Rfl:    potassium chloride (KLOR-CON M) 10 MEQ  tablet, , Disp: , Rfl:    potassium chloride SA (KLOR-CON M) 20 MEQ tablet, Take 1 tablet (20 mEq total) by mouth 3 (three) times daily., Disp: 21 tablet, Rfl: 0   primidone (MYSOLINE) 50 MG tablet, Take by mouth., Disp: , Rfl:    Probiotic Product (GNP PROBIOTIC COLON SUPPORT) CAPS, Take 1 capsule by mouth daily., Disp: , Rfl:    Saccharomyces boulardii (PROBIOTIC) 250 MG CAPS, Take 1 capsule by mouth daily., Disp: , Rfl:    sertraline (ZOLOFT) 25 MG tablet, Take 1 tablet (25 mg total) by mouth daily., Disp: 30 tablet, Rfl: 3   simvastatin (ZOCOR) 40  MG tablet, TAKE ONE TABLET BY MOUTH AT BEDTIME, Disp: 90 tablet, Rfl: 1   tadalafil, PAH, (ALYQ) 20 MG tablet, Take 2 tablets every day by oral route., Disp: , Rfl:    warfarin (COUMADIN) 3 MG tablet, Take 3 mg by mouth daily., Disp: , Rfl:    nitroGLYCERIN (NITROSTAT) 0.4 MG SL tablet, Place 0.4 mg under the tongue every 5 (five) minutes as needed for chest pain. (Patient not taking: Reported on 12/22/2022), Disp: , Rfl:    Allergies: Allergies  Allergen Reactions   Amoxicillin-Pot Clavulanate Other (See Comments)   Clarithromycin Other (See Comments)    Stomach problems   Erythromycin    Lisinopril Swelling   Amoxicillin Diarrhea    REVIEW OF SYSTEMS:   Review of Systems  Constitutional:  Negative for chills, fatigue and fever.  HENT:   Negative for lump/mass, mouth sores, nosebleeds, sore throat and trouble swallowing.   Eyes:  Negative for eye problems.  Respiratory:  Negative for cough and shortness of breath.   Cardiovascular:  Negative for chest pain, leg swelling and palpitations.  Gastrointestinal:  Positive for constipation. Negative for abdominal pain, diarrhea, nausea and vomiting.  Genitourinary:  Negative for bladder incontinence, difficulty urinating, dysuria, frequency, hematuria and nocturia.   Musculoskeletal:  Negative for arthralgias, back pain, flank pain, myalgias and neck pain.  Skin:  Negative for itching and rash.   Neurological:  Positive for dizziness and numbness. Negative for headaches.  Hematological:  Does not bruise/bleed easily.  Psychiatric/Behavioral:  Negative for depression, sleep disturbance and suicidal ideas. The patient is not nervous/anxious.   All other systems reviewed and are negative.    VITALS:   Blood pressure 114/72, pulse 87, temperature 98.4 F (36.9 C), temperature source Tympanic, resp. rate 18, weight 160 lb 4.8 oz (72.7 kg), SpO2 100%.  Wt Readings from Last 3 Encounters:  12/22/22 160 lb 4.8 oz (72.7 kg)  12/21/22 160 lb (72.6 kg)  12/19/22 161 lb 1.9 oz (73.1 kg)    Body mass index is 27.52 kg/m.  Performance status (ECOG): 1 - Symptomatic but completely ambulatory  PHYSICAL EXAM:   Physical Exam Vitals and nursing note reviewed. Exam conducted with a chaperone present.  Constitutional:      Appearance: Normal appearance.  Cardiovascular:     Rate and Rhythm: Normal rate and regular rhythm.     Pulses: Normal pulses.     Heart sounds: Normal heart sounds.  Pulmonary:     Effort: Pulmonary effort is normal.     Breath sounds: Normal breath sounds.  Abdominal:     Palpations: Abdomen is soft. There is no hepatomegaly, splenomegaly or mass.     Tenderness: There is no abdominal tenderness.  Musculoskeletal:     Right lower leg: No edema.     Left lower leg: No edema.  Lymphadenopathy:     Cervical: No cervical adenopathy.     Right cervical: No superficial, deep or posterior cervical adenopathy.    Left cervical: No superficial, deep or posterior cervical adenopathy.     Upper Body:     Right upper body: No supraclavicular or axillary adenopathy.     Left upper body: No supraclavicular or axillary adenopathy.  Neurological:     General: No focal deficit present.     Mental Status: She is alert and oriented to person, place, and time.  Psychiatric:        Mood and Affect: Mood normal.        Behavior: Behavior  normal.     LABS:      Latest  Ref Rng & Units 12/22/2022   12:06 PM 12/07/2022    1:02 PM 11/23/2022   11:57 AM  CBC  WBC 4.0 - 10.5 K/uL 3.1  3.6  3.6   Hemoglobin 12.0 - 15.0 g/dL 65.7  84.6  96.2   Hematocrit 36.0 - 46.0 % 34.5  35.5  33.1   Platelets 150 - 400 K/uL 86  107  90       Latest Ref Rng & Units 12/22/2022   12:06 PM 12/07/2022    1:02 PM 11/23/2022   11:57 AM  CMP  Glucose 70 - 99 mg/dL 952  841  88   BUN 8 - 23 mg/dL 13  15  22    Creatinine 0.44 - 1.00 mg/dL 3.24  4.01  0.27   Sodium 135 - 145 mmol/L 138  141  138   Potassium 3.5 - 5.1 mmol/L 3.2  3.6  3.1   Chloride 98 - 111 mmol/L 101  105  103   CO2 22 - 32 mmol/L 28  29  27    Calcium 8.9 - 10.3 mg/dL 8.5  9.2  7.8   Total Protein 6.5 - 8.1 g/dL 6.5  6.6  6.2   Total Bilirubin 0.3 - 1.2 mg/dL 0.8  0.6  0.8   Alkaline Phos 38 - 126 U/L 42  53  44   AST 15 - 41 U/L 25  25  22    ALT 0 - 44 U/L 23  25  22       No results found for: "CEA1", "CEA" / No results found for: "CEA1", "CEA" No results found for: "PSA1" No results found for: "CAN199" No results found for: "CAN125"  Lab Results  Component Value Date   TOTALPROTELP 6.1 12/07/2022   TOTALPROTELP 6.2 12/07/2022   ALBUMINELP 3.9 12/07/2022   A1GS 0.3 12/07/2022   A2GS 0.6 12/07/2022   BETS 1.0 12/07/2022   GAMS 0.4 12/07/2022   MSPIKE Not Observed 12/07/2022   SPEI Comment 12/07/2022   Lab Results  Component Value Date   TIBC 291 06/09/2021   TIBC 297 01/21/2021   TIBC 302 10/21/2020   FERRITIN 279 06/09/2021   FERRITIN 216 01/21/2021   FERRITIN 274 10/21/2020   IRONPCTSAT 24 06/09/2021   IRONPCTSAT 19 01/21/2021   IRONPCTSAT 28 10/21/2020   Lab Results  Component Value Date   LDH 129 03/02/2022   LDH 121 12/14/2021   LDH 113 11/09/2021     STUDIES:   DG Knee Complete 4 Views Right  Result Date: 12/10/2022 CLINICAL DATA:  Fall with right knee pain EXAM: RIGHT KNEE - COMPLETE 4 VIEW COMPARISON:  03/21/2022 FINDINGS: Total knee arthroplasty. No acute fracture or  dislocation. Subjective osteopenia. Negative for joint effusion IMPRESSION: No acute finding.  Located total knee arthroplasty. Electronically Signed   By: Tiburcio Pea M.D.   On: 12/10/2022 12:32   DG Hip Unilat With Pelvis 2-3 Views Right  Result Date: 12/10/2022 CLINICAL DATA:  Fall with hip pain EXAM: DG HIP (WITH OR WITHOUT PELVIS) 3V RIGHT COMPARISON:  None Available. FINDINGS: There is no evidence of hip fracture or dislocation. Generalized degenerative spurring, greatest at the symphysis pubis or there is also sclerosis. Pseudoarticulation between the transverse processes of L5 and the iliac crest. IMPRESSION: No acute finding. Electronically Signed   By: Tiburcio Pea M.D.   On: 12/10/2022 12:32   DG Lumbar Spine 2-3 Views  Result  Date: 12/10/2022 CLINICAL DATA:  Fall.  Lumbar pain. EXAM: LUMBAR SPINE - 2-3 VIEW COMPARISON:  04/06/2020 FINDINGS: Bones are diffusely demineralized. No evidence for an acute fracture. Trace anterolisthesis of L3 on 4 is stable. Interval vertebral augmentation of the compression deformity at L1. Loss of disc height noted L4-5 and L5-S1. IMPRESSION: 1. Interval vertebral augmentation of the compression deformity at L1. 2. No acute bony abnormality. 3. Degenerative disc disease at L4-5 and L5-S1. Electronically Signed   By: Kennith Center M.D.   On: 12/10/2022 12:18   CT Head Wo Contrast  Result Date: 12/10/2022 CLINICAL DATA:  Minor head trauma. Right leg and lower back pain after fall at home 2 weeks ago EXAM: CT HEAD WITHOUT CONTRAST TECHNIQUE: Contiguous axial images were obtained from the base of the skull through the vertex without intravenous contrast. RADIATION DOSE REDUCTION: This exam was performed according to the departmental dose-optimization program which includes automated exposure control, adjustment of the mA and/or kV according to patient size and/or use of iterative reconstruction technique. COMPARISON:  04/07/2021 head CT FINDINGS: Brain: No  evidence of acute infarction, hemorrhage, hydrocephalus, extra-axial collection or mass lesion/mass effect. Mild cerebral volume loss. Vascular: No hyperdense vessel or unexpected calcification. Skull: Normal. Negative for fracture or focal lesion. Sinuses/Orbits: No acute finding. IMPRESSION: Unremarkable head CT for age. Electronically Signed   By: Tiburcio Pea M.D.   On: 12/10/2022 11:55

## 2022-12-22 NOTE — Patient Instructions (Signed)
[facility]  Discharge Instructions: Thank you for choosing [facility] to provide your oncology and hematology care.  If you have a lab appointment with the Cancer Center - please note that after April 8th, 2024, all labs will be drawn in the cancer center.  You do not have to check in or register with the main entrance as you have in the past but will complete your check-in in the cancer center.  Wear comfortable clothing and clothing appropriate for easy access to any Portacath or PICC line.   We strive to give you quality time with your provider. You may need to reschedule your appointment if you arrive late (15 or more minutes).  Arriving late affects you and other patients whose appointments are after yours.  Also, if you miss three or more appointments without notifying the office, you may be dismissed from the clinic at the provider's discretion.      For prescription refill requests, have your pharmacy contact our office and allow 72 hours for refills to be completed.    Today you received the following chemotherapy and/or immunotherapy agents Velcade/Xgeva      To help prevent nausea and vomiting after your treatment, we encourage you to take your nausea medication as directed.  BELOW ARE SYMPTOMS THAT SHOULD BE REPORTED IMMEDIATELY: *FEVER GREATER THAN 100.4 F (38 C) OR HIGHER *CHILLS OR SWEATING *NAUSEA AND VOMITING THAT IS NOT CONTROLLED WITH YOUR NAUSEA MEDICATION *UNUSUAL SHORTNESS OF BREATH *UNUSUAL BRUISING OR BLEEDING *URINARY PROBLEMS (pain or burning when urinating, or frequent urination) *BOWEL PROBLEMS (unusual diarrhea, constipation, pain near the anus) TENDERNESS IN MOUTH AND THROAT WITH OR WITHOUT PRESENCE OF ULCERS (sore throat, sores in mouth, or a toothache) UNUSUAL RASH, SWELLING OR PAIN  UNUSUAL VAGINAL DISCHARGE OR ITCHING   Items with * indicate a potential emergency and should be followed up as soon as possible or go to  the Emergency Department if any problems should occur.  Please show the CHEMOTHERAPY ALERT CARD or IMMUNOTHERAPY ALERT CARD at check-in to the Emergency Department and triage nurse.  Should you have questions after your visit or need to cancel or reschedule your appointment, please contact [facility] [phone]  and follow the prompts.  Office hours are 8:00 a.m. to 4:30 p.m. Monday - Friday. Please note that voicemails left after 4:00 p.m. may not be returned until the following business day.  We are closed weekends and major holidays. You have access to a nurse at all times for urgent questions. Please call the main number to the clinic [phone] and follow the prompts.  For any non-urgent questions, you may also contact your provider using MyChart. We now offer e-Visits for anyone 87 and older to request care online for non-urgent symptoms. For details visit [email or web address].   Also download the MyChart app! Go to the app store, search "MyChart", open the app, select [facility], and log in with your MyChart username and password.

## 2022-12-23 ENCOUNTER — Other Ambulatory Visit: Payer: Self-pay | Admitting: Family Medicine

## 2022-12-23 DIAGNOSIS — M62838 Other muscle spasm: Secondary | ICD-10-CM

## 2022-12-23 NOTE — Assessment & Plan Note (Signed)
Labs and imaging reviewed She take percocet 5-325 mg as needed for pain No recent falls reported Encouraged keeping her homes and walkways free of clutter

## 2022-12-23 NOTE — Assessment & Plan Note (Signed)
PHQ-9 is 9 Reports minimum relief with buspar and not been taking meds Advised to not abruptly d/c antidepressants Will start the pt on Zoloft 25 mg daily Advised that it may take 2-4 weeks for benefit and 6-8 weeks for full effect Denies SI/ HI

## 2023-01-03 ENCOUNTER — Other Ambulatory Visit: Payer: Self-pay

## 2023-01-04 ENCOUNTER — Inpatient Hospital Stay: Payer: Medicare Other

## 2023-01-04 ENCOUNTER — Ambulatory Visit (INDEPENDENT_AMBULATORY_CARE_PROVIDER_SITE_OTHER): Payer: Medicare Other | Admitting: Family Medicine

## 2023-01-04 ENCOUNTER — Encounter: Payer: Self-pay | Admitting: Family Medicine

## 2023-01-04 VITALS — Ht 63.0 in | Wt 160.0 lb

## 2023-01-04 DIAGNOSIS — E876 Hypokalemia: Secondary | ICD-10-CM | POA: Diagnosis not present

## 2023-01-04 DIAGNOSIS — E538 Deficiency of other specified B group vitamins: Secondary | ICD-10-CM

## 2023-01-04 DIAGNOSIS — R5382 Chronic fatigue, unspecified: Secondary | ICD-10-CM | POA: Diagnosis not present

## 2023-01-04 DIAGNOSIS — Z1329 Encounter for screening for other suspected endocrine disorder: Secondary | ICD-10-CM

## 2023-01-04 MED ORDER — POTASSIUM CHLORIDE CRYS ER 10 MEQ PO TBCR: 10.0000 meq | EXTENDED_RELEASE_TABLET | Freq: Every day | ORAL | 0 refills | Status: DC

## 2023-01-04 NOTE — Patient Instructions (Signed)

## 2023-01-04 NOTE — Progress Notes (Signed)
Patient Office Visit   Subjective   Patient ID: Courtney Rogers, female    DOB: 08/27/1950  Age: 72 y.o. MRN: [id number]  CC:  Chief Complaint  Patient presents with   Fatigue    Patient complains of feeling weak, tired, fatigue starting a week ago. States she hit her head on Saturday.     HPI [name] 72 year old female, presents to the clinic for chronic fatigue and hypersomnolence.  She  has a past medical history of Acute myocardial infarction [facility]) (2009), Anxiety disorder, CAD (coronary artery disease), Cancer (HCC), Hyperlipidemia, Hypertension, IBS (irritable bowel syndrome), Non-ulcer dyspepsia (04/30/2009), Overactive bladder, PE (pulmonary embolism) (10/2012), Presence of permanent cardiac pacemaker, and Sleep apnea.  Patient complains of fatigue. Symptoms began several months ago. Sentinal symptom the patient feels fatigue began with: symptoms of arthritis. Symptoms of her fatigue have been anxiousness, diffuse soft tissue aches and pains, general malaise, and hypersomnolence. Patient describes the following psychologic symptoms: depression.  Patient denies fever, unusual rashes, cold intolerance, constipation and change in hair texture., and GI blood loss. Symptoms have been intermittent. Severity has been struggles to carry out day to day responsibilities.      Outpatient Encounter Medications as of 01/04/2023  Medication Sig   acetaminophen (TYLENOL) 500 MG tablet Take 1,000 mg by mouth 2 (two) times daily as needed for moderate pain or headache.   acyclovir (ZOVIRAX) 400 MG tablet TAKE ONE TABLET BY MOUTH TWICE DAILY   albuterol (VENTOLIN HFA) 108 (90 Base) MCG/ACT inhaler USE 2 PUFFS ONE TO TWO TIMES A DAY.   amLODipine (NORVASC) 10 MG tablet TAKE ONE TABLET BY MOUTH ONCE DAILY   ascorbic acid (VITAMIN C) 500 MG tablet Take 500 mg by mouth daily.   aspirin EC 81 MG tablet Take 81 mg by mouth at bedtime.   B Complex Vitamins (VITAMIN B COMPLEX) TABS Take 1  tablet by mouth daily.   B Complex-Folic Acid (B COMPLEX VITAMINS, W/ FA,) CAPS Take 1 capsule by mouth daily.   bortezomib SQ (VELCADE) 3.5 MG injection Inject into the skin once.   Calcium Carbonate-Vitamin D (CALCIUM 600+D PO) Take 1 tablet by mouth 2 (two) times daily.   chlorthalidone (HYGROTON) 25 MG tablet Take 25 mg by mouth daily.   cholecalciferol (VITAMIN D3) 10 MCG (400 UNIT) TABS tablet Take 400 Units by mouth daily.   cholecalciferol 25 MCG (1000 UT) tablet Take by mouth.   cyanocobalamin (VITAMIN B12) 1000 MCG tablet Take by mouth.   cyclobenzaprine (FLEXERIL) 10 MG tablet TAKE ONE-HALF TABLET BY MOUTH TWICE DAILY AS NEEDED FOR MUSCLE SPASMS   Daratumumab-Hyaluronidase-fihj (DARZALEX FASPRO [place]) Inject into the skin.   diclofenac Sodium (VOLTAREN) 1 % GEL Apply 4 g topically 4 (four) times daily.   fluticasone (FLONASE) 50 MCG/ACT nasal spray USE 2 SPRAYS IN EACH NOSTRIL ONCE DAILY   fluticasone-salmeterol (ADVAIR) 250-50 MCG/ACT AEPB Inhale into the lungs.   folic acid (FOLVITE) 1 MG tablet TAKE 1 TABLET BY MOUTH ONCE DAILY.   gabapentin (NEURONTIN) 100 MG capsule Take 100 mg by mouth 3 (three) times daily.   hydrochlorothiazide (MICROZIDE) 12.5 MG capsule Take 12.5 mg by mouth daily.   HYDROcodone-acetaminophen (NORCO/VICODIN) 5-325 MG tablet TAKE ONE TABLET BY MOUTH EVERY 8 HOURS AS NEEDED FOR MODERATE PAIN   Hyoscyamine Sulfate SL 0.125 MG SUBL    lenalidomide (REVLIMID) 10 MG capsule Take 1 capsule (10 mg total) by mouth daily. 21 days on, 7 days off every 28  days   lidocaine (HM LIDOCAINE PATCH) 4 % Place 1 patch onto the skin daily.   lidocaine (XYLOCAINE) 2 % solution Take 2 teaspoons before meals and at bedtime as needed. May repeat every 4 hours for a maximum dose of 8 per day.   Lidocaine HCl (XOLIDO) 2 % CREA Apply 1 application topically daily as needed (arthritisis).   loperamide (IMODIUM) 2 MG capsule Take 4 mg by mouth as needed. Take 2 tablets after the first  loose stool and then 1 tablet after each loose stool.  Do not exceed 8 capsules within 24 hours.   losartan (COZAAR) 100 MG tablet    magnesium oxide (MAG-OX) 400 (240 Mg) MG tablet Take 1 tablet (400 mg total) by mouth 2 (two) times daily.   metoprolol tartrate (LOPRESSOR) 50 MG tablet Take 1 tablet (50 mg total) by mouth 2 (two) times daily. (Patient taking differently: Take 25 mg by mouth 2 (two) times daily.)   montelukast (SINGULAIR) 10 MG tablet Take 10 mg by mouth daily.   montelukast (SINGULAIR) 10 MG tablet Take 1 tablet (10 mg total) by mouth at bedtime.   MYRBETRIQ 50 MG TB24 tablet TAKE ONE TABLET BY MOUTH ONCE DAILY   nitroGLYCERIN (NITROSTAT) 0.4 MG SL tablet Place 0.4 mg under the tongue every 5 (five) minutes as needed for chest pain.   pantoprazole (PROTONIX) 40 MG tablet Take 40 mg by mouth daily.   potassium chloride SA (KLOR-CON M) 20 MEQ tablet Take 1 tablet (20 mEq total) by mouth 3 (three) times daily.   primidone (MYSOLINE) 50 MG tablet Take by mouth.   Probiotic Product (GNP PROBIOTIC COLON SUPPORT) CAPS Take 1 capsule by mouth daily.   Saccharomyces boulardii (PROBIOTIC) 250 MG CAPS Take 1 capsule by mouth daily.   sertraline (ZOLOFT) 25 MG tablet Take 1 tablet (25 mg total) by mouth daily.   simvastatin (ZOCOR) 40 MG tablet TAKE ONE TABLET BY MOUTH AT BEDTIME   tadalafil, PAH, (ALYQ) 20 MG tablet Take 2 tablets every day by oral route.   warfarin (COUMADIN) 3 MG tablet Take 3 mg by mouth daily.   [DISCONTINUED] potassium chloride (KLOR-CON M) 10 MEQ tablet    potassium chloride (KLOR-CON M) 10 MEQ tablet Take 1 tablet (10 mEq total) by mouth daily for 21 days.   No facility-administered encounter medications on file as of 01/04/2023.    Past Surgical History:  Procedure Laterality Date   ABDOMINAL HYSTERECTOMY     BSO secondary to cyst     CARDIAC CATHETERIZATION     COLONOSCOPY  12/2009   Dr. Aleene Davidson, propofol, normal. Next TCS 12/2019   COLONOSCOPY N/A  05/22/2017   Examined portion ileum was normal, significant looping of colon, external hemorrhoids and rectal bleeding due to internal hemorrhoids, mild diverticulosis procedure: COLONOSCOPY;  Surgeon: West Bali, MD;  Location: [facility];  Service: Endoscopy;  Laterality: N/A;  10:30am   ESOPHAGOGASTRODUODENOSCOPY  05/11/2009   schatzki ring/small hiatal hernia/path:gastritis   ESOPHAGOGASTRODUODENOSCOPY N/A 05/22/2017   Multiple gastric polyps with biopsy benign fundic gland polyp, small bowel biopsy negative for celiac, gastric biopsy with mild gastritis but no H. pylori procedure: ESOPHAGOGASTRODUODENOSCOPY (EGD);  Surgeon: West Bali, MD;  Location: [facility];  Service: Endoscopy;  Laterality: N/A;   ESOPHAGOGASTRODUODENOSCOPY (EGD) WITH ESOPHAGEAL DILATION N/A 04/01/2013   IHK:[id number] at the gastroesophageal juction/multiple small polyps/mild gastritis   GIVENS CAPSULE STUDY N/A 06/07/2017   Frequent gastric erosions, normal small bowel mucosa.  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 Negative for chills and fever.  Eyes:  Negative for blurred vision.  Respiratory:  Negative for shortness of breath.   Cardiovascular:  Negative for chest pain.  Neurological:  Negative for dizziness and headaches.  Psychiatric/Behavioral:  Positive for depression.       Objective    Ht 5\' 3"  (1.6 m)   Wt 160 lb (72.6 kg)   SpO2 96%   BMI 28.34 kg/m   Physical Exam Vitals reviewed.  Constitutional:      General: She is not in acute distress.    Appearance: Normal appearance. She is not ill-appearing, toxic-appearing or diaphoretic.  HENT:     Head: Normocephalic.  Eyes:     General:         Right eye: No discharge.        Left eye: No discharge.     Conjunctiva/sclera: Conjunctivae normal.  Cardiovascular:     Rate and Rhythm: Normal rate.     Pulses: Normal pulses.     Heart sounds: Normal heart sounds.  Pulmonary:     Effort: Pulmonary effort is normal. No respiratory distress.     Breath sounds: Normal breath sounds.  Musculoskeletal:     Cervical back: Normal range of motion.  Skin:    General: Skin is warm and dry.     Capillary Refill: Capillary refill takes less than 2 seconds.  Neurological:     General: No focal deficit present.     Mental Status: She is alert and oriented to person, place, and time.  Psychiatric:        Mood and Affect: Mood normal.        Behavior: Behavior normal.       Assessment & Plan:  Hypokalemia -     BMP8+eGFR  Screening for thyroid disorder -     TSH + free T4  Vitamin B12 deficiency -     B12 and Folate Panel  Chronic fatigue Assessment & Plan: Labs ordered BMP, TSH PANEL, B12 & Folate Fatigue possible due to hypokalemia, depression. Recent Potassium level 3.2 on 12/22/2022 Refilled potassium chloride 10 MEQ tablet I encouraged to eat rich potassium foods such as, apricots, avocados, apples, bananas, spinach, kale, tomatoes, carrots ans potatoes. Explained adequate fluid intake.  Recent Head CT - normal Re-check potassium levels next visit with PCP   Other orders -     Potassium Chloride [facility] ER; Take 1 tablet (10 mEq total) by mouth daily for 21 days.  Dispense: 21 tablet; Refill: 0    Return if symptoms worsen or fail to improve.   Cruzita Lederer Newman Nip, FNP

## 2023-01-04 NOTE — Assessment & Plan Note (Addendum)
Labs ordered BMP, TSH PANEL, B12 & Folate Fatigue possible due to hypokalemia, depression. Recent Potassium level 3.2 on 12/22/2022 Refilled potassium chloride 10 MEQ tablet I encouraged to eat rich potassium foods such as, apricots, avocados, apples, bananas, spinach, kale, tomatoes, carrots ans potatoes. Explained adequate fluid intake.  Recent Head CT - normal Re-check potassium levels next visit with PCP

## 2023-01-05 ENCOUNTER — Encounter: Payer: Self-pay | Admitting: Hematology

## 2023-01-05 ENCOUNTER — Encounter: Payer: Self-pay | Admitting: Gastroenterology

## 2023-01-06 ENCOUNTER — Encounter: Payer: Self-pay | Admitting: Hematology

## 2023-01-06 LAB — BMP8+EGFR
BUN/Creatinine Ratio: 17 (ref 12–28)
BUN: 18 mg/dL (ref 8–27)
CO2: 27 mmol/L (ref 20–29)
Calcium: 8.4 mg/dL — ABNORMAL LOW (ref 8.7–10.3)
Chloride: 103 mmol/L (ref 96–106)
Creatinine, Ser: 1.09 mg/dL — ABNORMAL HIGH (ref 0.57–1.00)
Glucose: 105 mg/dL — ABNORMAL HIGH (ref 70–99)
Potassium: 2.7 mmol/L — ABNORMAL LOW (ref 3.5–5.2)
Sodium: 144 mmol/L (ref 134–144)
eGFR: 54 mL/min/{1.73_m2} — ABNORMAL LOW (ref >59)

## 2023-01-06 LAB — B12 AND FOLATE PANEL
Folate: >20 ng/mL (ref >3.0)
Vitamin B-12: 1776 pg/mL — ABNORMAL HIGH (ref 232–1245)

## 2023-01-06 LAB — TSH+FREE T4
Free T4: 1.24 ng/dL (ref 0.82–1.77)
TSH: 0.86 u[IU]/mL (ref 0.450–4.500)

## 2023-01-08 ENCOUNTER — Other Ambulatory Visit: Payer: Self-pay

## 2023-01-08 ENCOUNTER — Emergency Department (HOSPITAL_COMMUNITY)
Admission: EM | Admit: 2023-01-08 | Discharge: 2023-01-09 | Disposition: A | Discharge status: Home / Self Care | Payer: Medicare Other | Attending: Emergency Medicine | Admitting: Emergency Medicine

## 2023-01-08 ENCOUNTER — Encounter (HOSPITAL_COMMUNITY): Payer: Self-pay

## 2023-01-08 DIAGNOSIS — E860 Dehydration: Secondary | ICD-10-CM | POA: Diagnosis not present

## 2023-01-08 DIAGNOSIS — E876 Hypokalemia: Secondary | ICD-10-CM | POA: Insufficient documentation

## 2023-01-08 DIAGNOSIS — R5383 Other fatigue: Secondary | ICD-10-CM | POA: Diagnosis not present

## 2023-01-08 DIAGNOSIS — R42 Dizziness and giddiness: Secondary | ICD-10-CM | POA: Diagnosis present

## 2023-01-08 DIAGNOSIS — Z950 Presence of cardiac pacemaker: Secondary | ICD-10-CM | POA: Insufficient documentation

## 2023-01-08 DIAGNOSIS — I2510 Atherosclerotic heart disease of native coronary artery without angina pectoris: Secondary | ICD-10-CM | POA: Insufficient documentation

## 2023-01-08 DIAGNOSIS — R7989 Other specified abnormal findings of blood chemistry: Secondary | ICD-10-CM | POA: Insufficient documentation

## 2023-01-08 DIAGNOSIS — Z859 Personal history of malignant neoplasm, unspecified: Secondary | ICD-10-CM | POA: Insufficient documentation

## 2023-01-08 DIAGNOSIS — I10 Essential (primary) hypertension: Secondary | ICD-10-CM | POA: Diagnosis not present

## 2023-01-08 DIAGNOSIS — Z79899 Other long term (current) drug therapy: Secondary | ICD-10-CM | POA: Diagnosis not present

## 2023-01-08 LAB — CBC WITH DIFFERENTIAL/PLATELET
Abs Immature Granulocytes: 0.01 10*3/uL (ref 0.00–0.07)
Basophils Absolute: 0 10*3/uL (ref 0.0–0.1)
Basophils Relative: 1 %
Eosinophils Absolute: 0.2 10*3/uL (ref 0.0–0.5)
Eosinophils Relative: 6 %
HCT: 34.2 % — ABNORMAL LOW (ref 36.0–46.0)
Hemoglobin: 11.5 g/dL — ABNORMAL LOW (ref 12.0–15.0)
Immature Granulocytes: 0 %
Lymphocytes Relative: 25 %
Lymphs Abs: 1.1 10*3/uL (ref 0.7–4.0)
MCH: 35.1 pg — ABNORMAL HIGH (ref 26.0–34.0)
MCHC: 33.6 g/dL (ref 30.0–36.0)
MCV: 104.3 fL — ABNORMAL HIGH (ref 80.0–100.0)
Monocytes Absolute: 0.4 10*3/uL (ref 0.1–1.0)
Monocytes Relative: 9 %
Neutro Abs: 2.6 10*3/uL (ref 1.7–7.7)
Neutrophils Relative %: 59 %
Platelets: 63 10*3/uL — ABNORMAL LOW (ref 150–400)
RBC: 3.28 MIL/uL — ABNORMAL LOW (ref 3.87–5.11)
RDW: 16.4 % — ABNORMAL HIGH (ref 11.5–15.5)
WBC: 4.4 10*3/uL (ref 4.0–10.5)
nRBC: 0 % (ref 0.0–0.2)

## 2023-01-08 LAB — COMPREHENSIVE METABOLIC PANEL
ALT: 22 U/L (ref 0–44)
AST: 24 U/L (ref 15–41)
Albumin: 3.9 g/dL (ref 3.5–5.0)
Alkaline Phosphatase: 46 U/L (ref 38–126)
Anion gap: 8 (ref 5–15)
BUN: 23 mg/dL (ref 8–23)
CO2: 27 mmol/L (ref 22–32)
Calcium: 8.1 mg/dL — ABNORMAL LOW (ref 8.9–10.3)
Chloride: 104 mmol/L (ref 98–111)
Creatinine, Ser: 1.19 mg/dL — ABNORMAL HIGH (ref 0.44–1.00)
GFR, Estimated: 48 mL/min — ABNORMAL LOW (ref >60)
Glucose, Bld: 83 mg/dL (ref 70–99)
Potassium: 2.9 mmol/L — ABNORMAL LOW (ref 3.5–5.1)
Sodium: 139 mmol/L (ref 135–145)
Total Bilirubin: 0.8 mg/dL (ref 0.3–1.2)
Total Protein: 6.2 g/dL — ABNORMAL LOW (ref 6.5–8.1)

## 2023-01-08 LAB — TROPONIN I (HIGH SENSITIVITY): Troponin I (High Sensitivity): 8 ng/L (ref <18)

## 2023-01-08 LAB — LACTIC ACID, PLASMA: Lactic Acid, Venous: 1.7 mmol/L (ref 0.5–1.9)

## 2023-01-08 MED ORDER — LACTATED RINGERS IV BOLUS
500.0000 mL | Freq: Once | INTRAVENOUS | Status: AC
  Administered 2023-01-08: 500 mL via INTRAVENOUS

## 2023-01-08 MED ORDER — POTASSIUM CHLORIDE CRYS ER 20 MEQ PO TBCR
40.0000 meq | EXTENDED_RELEASE_TABLET | Freq: Once | ORAL | Status: AC
  Administered 2023-01-09: 40 meq via ORAL
  Filled 2023-01-08: qty 2

## 2023-01-08 NOTE — ED Notes (Signed)
Patient reports dizziness and headache since Friday. Also reports she fell last week by rolling out of the bed and hit her head, denies loss of consciousness; reports she has been seen by her PCP since the fall and made them aware at that time. Patient is alert and oriented, in no apparent distress

## 2023-01-08 NOTE — Discharge Instructions (Signed)
Thank you for coming to [facility] Emergency Department. You were seen for dizziness and weakness. We did an exam, labs, and this showed a potassium of 2.9.  This is increased from 01/04/2023 when it was 2.7.  We gave you potassium supplementation here in the emergency department and urged you to continue taking your potassium supplements at home.  Additionally, you are likely dehydrated from decreased water intake.  Please ensure that you are staying well-hydrated at home. Please follow up with your primary care provider within 1 week for lab recheck.  Do not hesitate to return to the ED or call 911 if you experience: -Worsening symptoms -Chest pain, shortness of breath -Lightheadedness, passing out -Fevers/chills -Anything else that concerns you

## 2023-01-08 NOTE — ED Triage Notes (Signed)
Pt stated that she has been feeling dizzy and lightheaded most of the day and wants her potassium checked.

## 2023-01-08 NOTE — ED Provider Notes (Signed)
[place] EMERGENCY DEPARTMENT AT [facility] Provider Note   CSN: [id number] Arrival date & time: 01/08/23  2059     History {Add pertinent medical, surgical, social history, OB history to HPI:1} Chief Complaint  Patient presents with   Dizziness    Courtney Rogers is a 72 y.o. female with PMH as listed below who presents feeling fatigued, dizzy and "woozy" for about one week, worse today, and thinks her potassium is low. She has felt this way with hypokalemia in the past. She has MM currently on Velcade and Revlimid. She denies f/c, recent illnesses, chest pain, shortness of breath, cough, abdominal pain, nausea vomiting diarrhea constipation, urinary symptoms, lower extremity edema.  She states that she has been eating well recently but has not been drinking enough water.  She states that she has been to the doctor a couple of times this week and her blood pressure has been in the 90s systolic at those visits which is low for her.  Per chart review she was seen by her family practice NP 01/04/23. K found to be 2.7. Cr elevated 1.09 up from BL 0.8-1.0. Folate/B12 okay. She does take a daily potassium supplement 10 mEq. Hgb was 11.6 on 12/22/22.    Past Medical History:  Diagnosis Date   Acute myocardial infarction [facility]) 2009   CAD/no stent medically managed   Anxiety disorder    CAD (coronary artery disease)    Cancer (HCC)    multiple myeloma   Hyperlipidemia    Hypertension    IBS (irritable bowel syndrome)    Non-ulcer dyspepsia 04/30/2009   Qualifier: Diagnosis of  By: Darrick Penna MD, Sandi L    Overactive bladder    PE (pulmonary embolism) 10/2012   left lung   Presence of permanent cardiac pacemaker    Sleep apnea        Home Medications Prior to Admission medications   Medication Sig Start Date End Date Taking? Authorizing Provider  acetaminophen (TYLENOL) 500 MG tablet Take 1,000 mg by mouth 2 (two) times daily as needed for moderate pain or headache.     [provider]  acyclovir (ZOVIRAX) 400 MG tablet TAKE ONE TABLET BY MOUTH TWICE DAILY 12/20/22   Doreatha Massed, MD  albuterol (VENTOLIN HFA) 108 (90 Base) MCG/ACT inhaler USE 2 PUFFS ONE TO TWO TIMES A DAY. 05/11/22   Gilmore Laroche, FNP  amLODipine (NORVASC) 10 MG tablet TAKE ONE TABLET BY MOUTH ONCE DAILY 10/18/22   Doreatha Massed, MD  ascorbic acid (VITAMIN C) 500 MG tablet Take 500 mg by mouth daily. 02/07/22   [provider]  aspirin EC 81 MG tablet Take 81 mg by mouth at bedtime.    [provider]  B Complex Vitamins (VITAMIN B COMPLEX) TABS Take 1 tablet by mouth daily.    [provider]  B Complex-Folic Acid (B COMPLEX VITAMINS, W/ FA,) CAPS Take 1 capsule by mouth daily. 09/09/21   [provider]  bortezomib SQ (VELCADE) 3.5 MG injection Inject into the skin once. 08/10/21   [provider]  Calcium Carbonate-Vitamin D (CALCIUM 600+D PO) Take 1 tablet by mouth 2 (two) times daily.    [provider]  chlorthalidone (HYGROTON) 25 MG tablet Take 25 mg by mouth daily. 06/04/22   [provider]  cholecalciferol (VITAMIN D3) 10 MCG (400 UNIT) TABS tablet Take 400 Units by mouth daily. 09/30/22   [provider]  cholecalciferol 25 MCG (1000 UT) tablet Take by mouth.  10/15/20   [provider]  cyanocobalamin (VITAMIN B12) 1000 MCG tablet Take by mouth. 10/12/20   [provider]  cyclobenzaprine (FLEXERIL) 10 MG tablet TAKE ONE-HALF TABLET BY MOUTH TWICE DAILY AS NEEDED FOR MUSCLE SPASMS 12/23/22   Gilmore Laroche, FNP  Daratumumab-Hyaluronidase-fihj (DARZALEX FASPRO [place]) Inject into the skin. 08/10/21   [provider]  diclofenac Sodium (VOLTAREN) 1 % GEL Apply 4 g topically 4 (four) times daily. 11/19/21   Gilmore Laroche, FNP  fluticasone (FLONASE) 50 MCG/ACT nasal spray USE 2 SPRAYS IN EACH NOSTRIL ONCE DAILY 11/28/22   Gilmore Laroche, FNP  fluticasone-salmeterol (ADVAIR)  250-50 MCG/ACT AEPB Inhale into the lungs. 10/12/20   [provider]  folic acid (FOLVITE) 1 MG tablet TAKE 1 TABLET BY MOUTH ONCE DAILY. 03/15/22   Doreatha Massed, MD  gabapentin (NEURONTIN) 100 MG capsule Take 100 mg by mouth 3 (three) times daily. 05/07/19   [provider]  hydrochlorothiazide (MICROZIDE) 12.5 MG capsule Take 12.5 mg by mouth daily. 11/04/21   [provider]  HYDROcodone-acetaminophen (NORCO/VICODIN) 5-325 MG tablet TAKE ONE TABLET BY MOUTH EVERY 8 HOURS AS NEEDED FOR MODERATE PAIN 10/10/22   Doreatha Massed, MD  Hyoscyamine Sulfate SL 0.125 MG SUBL     [provider]  lenalidomide (REVLIMID) 10 MG capsule Take 1 capsule (10 mg total) by mouth daily. 21 days on, 7 days off every 28 days 12/14/22   Doreatha Massed, MD  lidocaine (HM LIDOCAINE PATCH) 4 % Place 1 patch onto the skin daily. 03/21/22   Rondel Baton, MD  lidocaine (XYLOCAINE) 2 % solution Take 2 teaspoons before meals and at bedtime as needed. May repeat every 4 hours for a maximum dose of 8 per day. 05/10/22   Tiffany Kocher, PA-C  Lidocaine HCl (XOLIDO) 2 % CREA Apply 1 application topically daily as needed (arthritisis).    [provider]  loperamide (IMODIUM) 2 MG capsule Take 4 mg by mouth as needed. Take 2 tablets after the first loose stool and then 1 tablet after each loose stool.  Do not exceed 8 capsules within 24 hours.    [provider]  losartan (COZAAR) 100 MG tablet  01/16/21   [provider]  magnesium oxide (MAG-OX) 400 (240 Mg) MG tablet Take 1 tablet (400 mg total) by mouth 2 (two) times daily. 10/03/22   Doreatha Massed, MD  metoprolol tartrate (LOPRESSOR) 50 MG tablet Take 1 tablet (50 mg total) by mouth 2 (two) times daily. Patient taking differently: Take 25 mg by mouth 2 (two) times daily. 10/07/21   Paseda, Baird Kay, FNP  montelukast (SINGULAIR) 10 MG tablet Take 10 mg by mouth daily.    [provider]  montelukast (SINGULAIR) 10 MG tablet Take 1 tablet (10 mg total) by mouth at bedtime. 09/30/22   Gilmore Laroche, FNP  MYRBETRIQ 50 MG TB24 tablet TAKE ONE TABLET BY MOUTH ONCE DAILY 12/15/22   Gilmore Laroche, FNP  nitroGLYCERIN (NITROSTAT) 0.4 MG SL tablet Place 0.4 mg under the tongue every 5 (five) minutes as needed for chest pain.    [provider]  pantoprazole (PROTONIX) 40 MG tablet Take 40 mg by mouth daily. 08/10/20   [provider]  potassium chloride (KLOR-CON M) 10 MEQ tablet Take 1 tablet (10 mEq total) by mouth daily for 21 days. 01/04/23 01/25/23  Del Nigel Berthold, FNP  potassium chloride SA (KLOR-CON M) 20 MEQ tablet Take 1 tablet (20 mEq total) by mouth 3 (  three) times daily. 09/27/21   Dione Booze, MD  primidone (MYSOLINE) 50 MG tablet Take by mouth. 03/31/21   [provider]  Probiotic Product (GNP PROBIOTIC COLON SUPPORT) CAPS Take 1 capsule by mouth daily. 07/08/21   [provider]  Saccharomyces boulardii (PROBIOTIC) 250 MG CAPS Take 1 capsule by mouth daily. 12/09/22   [provider]  sertraline (ZOLOFT) 25 MG tablet Take 1 tablet (25 mg total) by mouth daily. 12/19/22   Gilmore Laroche, FNP  simvastatin (ZOCOR) 40 MG tablet TAKE ONE TABLET BY MOUTH AT BEDTIME 10/24/22   Gilmore Laroche, FNP  tadalafil, PAH, (ALYQ) 20 MG tablet Take 2 tablets every day by oral route.    [provider]  warfarin (COUMADIN) 3 MG tablet Take 3 mg by mouth daily. 06/23/21   [provider]      Allergies    Amoxicillin-pot clavulanate, Clarithromycin, Erythromycin, Lisinopril, and Amoxicillin    Review of Systems   Review of Systems A 10 point review of systems was performed and is negative unless otherwise reported in HPI.  Physical Exam Updated Vital Signs BP (!) 91/58   Pulse 60   Temp 98.1 F (36.7 C) (Oral)   Resp 16   Ht 5\' 3"  (1.6 m)   Wt 72.6 kg   SpO2 96%   BMI 28.34 kg/m  Physical  Exam General: Normal appearing female, lying in bed.  HEENT: PERRLA, Sclera anicteric, MMM, trachea midline.  Cardiology: RRR, no murmurs/rubs/gallops. BL radial and DP pulses equal bilaterally.  Resp: Normal respiratory rate and effort. CTAB, no wheezes, rhonchi, crackles.  Abd: Soft, non-tender, non-distended. No rebound tenderness or guarding.  GU: Deferred. MSK: No peripheral edema or signs of trauma. Extremities without deformity or TTP. No cyanosis or clubbing. Skin: warm, dry. No rashes or lesions. Back: No CVA tenderness Neuro: A&Ox4, CNs II-XII grossly intact. MAEs. Sensation grossly intact.  Psych: Normal mood and affect.   ED Results / Procedures / Treatments   Labs (all labs ordered are listed, but only abnormal results are displayed) Labs Reviewed  CBC WITH DIFFERENTIAL/PLATELET  COMPREHENSIVE METABOLIC PANEL  LACTIC ACID, PLASMA  TROPONIN I (HIGH SENSITIVITY)    EKG EKG Interpretation Date/Time:  Sunday January 08 2023 21:29:25 EDT Ventricular Rate:  60 PR Interval:  167 QRS Duration:  206 QT Interval:  534 QTC Calculation: 534 R Axis:   -71  Text Interpretation: AV dual-paced rhythm Similar to prior EKGs Confirmed by Vivi Barrack [phone]) on 01/08/2023 10:07:55 PM  Radiology No results found.  Procedures Procedures  {Document cardiac monitor, telemetry assessment procedure when appropriate:1}  Medications Ordered in ED Medications  lactated ringers bolus 500 mL (has no administration in time range)    ED Course/ Medical Decision Making/ A&P                          Medical Decision Making Amount and/or Complexity of Data Reviewed Labs: ordered.    This patient presents to the ED for concern of fatigue/dizziness, c/f hypokalemia, this involves an extensive number of treatment options, and is a complaint that carries with it a high risk of complications and morbidity.  I considered the following differential and admission for this acute, potentially  life threatening condition.   MDM:    Patient with soft blood pressure in the 90s over 50s.  She reports decreased p.o. intake to water and will give small fluid bolus for possible dehydration/hypovolemia.  She also reports  hypokalemia and labs tested on the 14th with PCP demonstrate hypokalemia to 2.7.  Today K is ***.  Unable to assess for EKG changes as patient has a paced rhythm which appears the same as prior EKGs. Consider also ***     Labs: I Ordered, and personally interpreted labs.  The pertinent results include:  those listed above  Additional history obtained from chart review.   Cardiac Monitoring: The patient was maintained on a cardiac monitor.  I personally viewed and interpreted the cardiac monitored which showed an underlying rhythm of: AV paced rhythm  Reevaluation: After the interventions noted above, I reevaluated the patient and found that they have :{resolved/improved/worsened:23923::"improved"}  Social Determinants of Health: Lives independently  Disposition:  ***  Co morbidities that complicate the patient evaluation  Past Medical History:  Diagnosis Date   Acute myocardial infarction (HCC) 2009   CAD/no stent medically managed   Anxiety disorder    CAD (coronary artery disease)    Cancer (HCC)    multiple myeloma   Hyperlipidemia    Hypertension    IBS (irritable bowel syndrome)    Non-ulcer dyspepsia 04/30/2009   Qualifier: Diagnosis of  By: Darrick Penna MD, Sandi L    Overactive bladder    PE (pulmonary embolism) 10/2012   left lung   Presence of permanent cardiac pacemaker    Sleep apnea      Medicines Meds ordered this encounter  Medications   lactated ringers bolus 500 mL    I have reviewed the patients home medicines and have made adjustments as needed  Problem List / ED Course: Problem List Items Addressed This Visit       Other   Fatigue   Other Visit Diagnoses     Hypokalemia    -  Primary   Dizziness                 {Document critical care time when appropriate:1} {Document review of labs and clinical decision tools ie heart score, Chads2Vasc2 etc:1}  {Document your independent review of radiology images, and any outside records:1} {Document your discussion with family members, caretakers, and with consultants:1} {Document social determinants of health affecting pt's care:1} {Document your decision making why or why not admission, treatments were needed:1}  This note was created using dictation software, which may contain spelling or grammatical errors.

## 2023-01-09 DIAGNOSIS — R42 Dizziness and giddiness: Secondary | ICD-10-CM | POA: Diagnosis not present

## 2023-01-10 ENCOUNTER — Telehealth: Payer: Self-pay

## 2023-01-10 NOTE — Transitions of Care (Post Inpatient/ED Visit) (Signed)
01/10/2023  Name: Courtney Rogers MRN: [id number] DOB: 07-23-1950  Today's TOC FU Call Status: Today's TOC FU Call Status:: Successful TOC FU Call Completed TOC FU Call Complete Date: 01/10/23  Transition Care Management Follow-up Telephone Call Date of Discharge: 01/09/23 [facility] Type of Discharge: Emergency Department Reason for ED Visit: Other: (Hypokalemia) How have you been since you were released from the hospital?: Better Any questions or concerns?: No  Items Reviewed: Did you receive and understand the discharge instructions provided?: Yes Medications obtained,verified, and reconciled?: Yes (Medications Reviewed) Any new allergies since your discharge?: No Dietary orders reviewed?: Yes Do you have support at home?: Yes  Medications Reviewed Today: Medications Reviewed Today     Reviewed by Merleen Nicely, LPN (Licensed Practical Nurse) on 01/10/23 at 1128  Med List Status: <None>   Medication Order Taking? Sig Documenting Provider Last Dose Status Informant  acetaminophen (TYLENOL) 500 MG tablet [id number] Yes Take 1,000 mg by mouth 2 (two) times daily as needed for moderate pain or headache. [provider] Taking Active Self  acyclovir (ZOVIRAX) 400 MG tablet [id number] Yes TAKE ONE TABLET BY MOUTH TWICE DAILY Doreatha Massed, MD Taking Active   albuterol (VENTOLIN HFA) 108 (90 Base) MCG/ACT inhaler [id number] Yes USE 2 PUFFS ONE TO TWO TIMES A DAY. Gilmore Laroche, FNP Taking Active   amLODipine (NORVASC) 10 MG tablet [id number] Yes TAKE ONE TABLET BY MOUTH ONCE DAILY Doreatha Massed, MD Taking Active   ascorbic acid (VITAMIN C) 500 MG tablet [id number] Yes Take 500 mg by mouth daily. [provider] Taking Active   aspirin EC 81 MG tablet [id number] Yes Take 81 mg by mouth at bedtime. [provider] Taking Active Self  B Complex Vitamins (VITAMIN B COMPLEX) TABS [id number] Yes Take 1 tablet by mouth  daily. [provider] Taking Active Self  B Complex-Folic Acid (B COMPLEX VITAMINS, W/ FA,) CAPS [id number] Yes Take 1 capsule by mouth daily. [provider] Taking Active   bortezomib SQ (VELCADE) 3.5 MG injection [id number] Yes Inject into the skin once. [provider] Taking Active Self  Calcium Carbonate-Vitamin D (CALCIUM 600+D PO) [id number] Yes Take 1 tablet by mouth 2 (two) times daily. [provider] Taking Active Self  chlorthalidone (HYGROTON) 25 MG tablet [id number] Yes Take 25 mg by mouth daily. [provider] Taking Active   cholecalciferol (VITAMIN D3) 10 MCG (400 UNIT) TABS tablet [id number] Yes Take 400 Units by mouth daily. [provider] Taking Active   cholecalciferol 25 MCG (1000 UT) tablet [id number] Yes Take by mouth. [provider] Taking Active   cyanocobalamin (VITAMIN B12) 1000 MCG tablet [id number] Yes Take by mouth. [provider] Taking Active   cyclobenzaprine (FLEXERIL) 10 MG tablet [id number] Yes TAKE ONE-HALF TABLET BY MOUTH TWICE DAILY AS NEEDED FOR MUSCLE SPASMS Gilmore Laroche, FNP Taking Active   Daratumumab-Hyaluronidase-fihj [facility] FASPRO [place]) [id number] Yes Inject into the skin. [provider] Taking Active Self  diclofenac Sodium (VOLTAREN) 1 % GEL [id number] Yes Apply 4 g topically 4 (four) times daily. Gilmore Laroche, FNP Taking Active   fluticasone [facility]) 50 MCG/ACT nasal spray [id number] Yes USE 2 SPRAYS IN EACH NOSTRIL ONCE DAILY Gilmore Laroche, FNP Taking Active   fluticasone-salmeterol (ADVAIR) 250-50 MCG/ACT AEPB [id number] Yes Inhale into the lungs. [provider] Taking Active   folic acid (FOLVITE) 1 MG tablet [id number] Yes TAKE 1 TABLET BY MOUTH ONCE DAILY. Doreatha Massed, MD Taking Active   gabapentin (NEURONTIN)  100 MG capsule [id number] Yes Take 100 mg by mouth 3 (three) times daily. [provider] Taking Active Self  hydrochlorothiazide  (MICROZIDE) 12.5 MG capsule [id number] Yes Take 12.5 mg by mouth daily. [provider] Taking Active   HYDROcodone-acetaminophen (NORCO/VICODIN) 5-325 MG tablet [id number] Yes TAKE ONE TABLET BY MOUTH EVERY 8 HOURS AS NEEDED FOR MODERATE PAIN Doreatha Massed, MD Taking Active   Hyoscyamine Sulfate SL 0.125 MG SUBL [id number] Yes  [provider] Taking Active   lenalidomide (REVLIMID) 10 MG capsule [id number] Yes Take 1 capsule (10 mg total) by mouth daily. 21 days on, 7 days off every 28 days Doreatha Massed, MD Taking Active   lidocaine (HM LIDOCAINE PATCH) 4 % [id number] Yes Place 1 patch onto the skin daily. Rondel Baton, MD Taking Active   lidocaine (XYLOCAINE) 2 % solution [id number] Yes Take 2 teaspoons before meals and at bedtime as needed. May repeat every 4 hours for a maximum dose of 8 per day. Tiffany Kocher, PA-C Taking Active   Lidocaine HCl (XOLIDO) 2 % CREA [id number] Yes Apply 1 application topically daily as needed (arthritisis). [provider] Taking Active Self  loperamide (IMODIUM) 2 MG capsule [id number] Yes Take 4 mg by mouth as needed. Take 2 tablets after the first loose stool and then 1 tablet after each loose stool.  Do not exceed 8 capsules within 24 hours. [provider] Taking Active   losartan (COZAAR) 100 MG tablet [id number] Yes  [provider] Taking Active   magnesium oxide (MAG-OX) 400 (240 Mg) MG tablet [id number] Yes Take 1 tablet (400 mg total) by mouth 2 (two) times daily. Doreatha Massed, MD Taking Active   metoprolol tartrate (LOPRESSOR) 50 MG tablet [id number] Yes Take 1 tablet (50 mg total) by mouth 2 (two) times daily.  Patient taking differently: Take 25 mg by mouth 2 (two) times daily.   [name], FNP Taking Active   montelukast (SINGULAIR) 10 MG tablet [id number] Yes Take 10 mg by mouth daily. [provider] Taking Active Self  montelukast (SINGULAIR) 10 MG tablet [id number] Yes  Take 1 tablet (10 mg total) by mouth at bedtime. Gilmore Laroche, FNP Taking Active   MYRBETRIQ 50 MG TB24 tablet [id number] Yes TAKE ONE TABLET BY MOUTH ONCE DAILY Gilmore Laroche, FNP Taking Active   nitroGLYCERIN (NITROSTAT) 0.4 MG SL tablet [id number] Yes Place 0.4 mg under the tongue every 5 (five) minutes as needed for chest pain. [provider] Taking Active Self  pantoprazole (PROTONIX) 40 MG tablet [id number] Yes Take 40 mg by mouth daily. [provider] Taking Active Self  potassium chloride (KLOR-CON M) 10 MEQ tablet [id number] Yes Take 1 tablet (10 mEq total) by mouth daily for 21 days. Del Newman Nip, Tenna Child, FNP Taking Active   potassium chloride SA (KLOR-CON M) 20 MEQ tablet [id number] Yes Take 1 tablet (20 mEq total) by mouth 3 (three) times daily. Dione Booze, MD Taking Active   primidone (MYSOLINE) 50 MG tablet [id number] Yes Take by mouth. [provider] Taking Active   Probiotic Product (GNP PROBIOTIC COLON SUPPORT) CAPS [id number] Yes Take 1 capsule by mouth daily. [provider] Taking Active Self  Saccharomyces boulardii (PROBIOTIC) 250 MG CAPS [id number] Yes Take 1 capsule by mouth daily. [provider] Taking Active   sertraline (ZOLOFT) 25 MG tablet [id number] Yes Take 1 tablet (25 mg total) by mouth daily. Gilmore Laroche, FNP Taking Active   simvastatin (ZOCOR) 40 MG tablet [id number] Yes TAKE  ONE TABLET BY MOUTH AT BEDTIME Gilmore Laroche, FNP Taking Active   tadalafil, PAH, (ALYQ) 20 MG tablet [id number] Yes Take 2 tablets every day by oral route. [provider] Taking Active   warfarin (COUMADIN) 3 MG tablet [id number] Yes Take 3 mg by mouth daily. [provider] Taking Active Self            Home Care and Equipment/Supplies: Were Home Health Services Ordered?: No Any new equipment or medical supplies ordered?: No  Functional Questionnaire: Do you need assistance with bathing/showering or dressing?:  No Do you need assistance with meal preparation?: No Do you need assistance with eating?: No Do you have difficulty maintaining continence: No Do you need assistance with getting out of bed/getting out of a chair/moving?: No Do you have difficulty managing or taking your medications?: No  Follow up appointments reviewed: PCP Follow-up appointment confirmed?: (S) No (pt needs fu appt 01-16-23 no available appts- will ask front desk to call pt to make fu appt) MD Provider Line Number:[phone] Given: Yes Follow-up Provider: Gilmore Laroche FNP Specialist Hospital Follow-up appointment confirmed?: Yes Date of Specialist follow-up appointment?: 01/18/23 Follow-Up Specialty Provider:: oncologist Do you need transportation to your follow-up appointment?: No Do you understand care options if your condition(s) worsen?: Yes-patient verbalized understanding    SIGNATURE  Woodfin Ganja LPN [facility] Nurse Health Advisor Direct Dial [phone]

## 2023-01-11 ENCOUNTER — Telehealth: Payer: Self-pay | Admitting: Family Medicine

## 2023-01-11 NOTE — Telephone Encounter (Signed)
Pt had questions about potassium strength went over ED notes.

## 2023-01-11 NOTE — Telephone Encounter (Signed)
Patient called has an appointment 08.22 at 8:00 am and needs to know how much potassium she needs to take before coming in tomorrow morning? Call back # [phone].

## 2023-01-12 ENCOUNTER — Encounter: Payer: Self-pay | Admitting: Family Medicine

## 2023-01-12 ENCOUNTER — Ambulatory Visit (INDEPENDENT_AMBULATORY_CARE_PROVIDER_SITE_OTHER): Payer: Medicare Other | Admitting: Family Medicine

## 2023-01-12 VITALS — BP 107/68 | HR 82 | Ht 63.0 in | Wt 161.0 lb

## 2023-01-12 DIAGNOSIS — E876 Hypokalemia: Secondary | ICD-10-CM | POA: Insufficient documentation

## 2023-01-12 DIAGNOSIS — R251 Tremor, unspecified: Secondary | ICD-10-CM | POA: Diagnosis not present

## 2023-01-12 NOTE — Assessment & Plan Note (Addendum)
The patient has a history of myocardial infarction (MI) in 2014 and is currently following up with cardiology in [place]. She is taking chlorthalidone 12.5 mg daily. She had questions regarding her potassium supplementation, specifically whether to take the 20 mEq BID prescribed by cardiology or the 10 mEq daily recently prescribed. I advised her to take the potassium 10 mEq daily and to increase her intake of potassium-rich foods, such as bananas, potatoes, spinach, oranges, beans, and avocados.

## 2023-01-12 NOTE — Progress Notes (Signed)
Established Patient Office Visit  Subjective:  Patient ID: Courtney Rogers, female    DOB: 1951/01/27  Age: 72 y.o. MRN: [id number]  CC:  Chief Complaint  Patient presents with   Follow-up    ER follow up , confusion, possible medication issues     HPI Courtney Rogers is a 72 y.o. female presents for  ED follow up.   Hypokalemia: The patient was seen in the emergency department on January 08, 2023, for hypokalemia, with potassium levels recorded at 2.9 mEq/L. She received a fluid bolus and a single dose of potassium chloride 40 mEq before being discharged. The EKG performed at that time was reassuring. During today's clinic visit, the patient reports no symptoms of chest pain, shortness of breath, cough, abdominal pain, nausea, vomiting, diarrhea, constipation, urinary issues, or lower extremity edema. She endorses questions concerning her potassium supplement and Topamax.   Past Medical History:  Diagnosis Date   Acute myocardial infarction [facility]) 2009   CAD/no stent medically managed   Anxiety disorder    CAD (coronary artery disease)    Cancer (HCC)    multiple myeloma   Hyperlipidemia    Hypertension    IBS (irritable bowel syndrome)    Non-ulcer dyspepsia 04/30/2009   Qualifier: Diagnosis of  By: Darrick Penna MD, Sandi L    Overactive bladder    PE (pulmonary embolism) 10/2012   left lung   Presence of permanent cardiac pacemaker    Sleep apnea     Past Surgical History:  Procedure Laterality Date   ABDOMINAL HYSTERECTOMY     BSO secondary to cyst     CARDIAC CATHETERIZATION     COLONOSCOPY  12/2009   Dr. Aleene Davidson, propofol, normal. Next TCS 12/2019   COLONOSCOPY N/A 05/22/2017   Examined portion ileum was normal, significant looping of colon, external hemorrhoids and rectal bleeding due to internal hemorrhoids, mild diverticulosis procedure: COLONOSCOPY;  Surgeon: West Bali, MD;  Location: [facility];  Service: Endoscopy;  Laterality: N/A;  10:30am    ESOPHAGOGASTRODUODENOSCOPY  05/11/2009   schatzki ring/small hiatal hernia/path:gastritis   ESOPHAGOGASTRODUODENOSCOPY N/A 05/22/2017   Multiple gastric polyps with biopsy benign fundic gland polyp, small bowel biopsy negative for celiac, gastric biopsy with mild gastritis but no H. pylori procedure: ESOPHAGOGASTRODUODENOSCOPY (EGD);  Surgeon: West Bali, MD;  Location: [facility];  Service: Endoscopy;  Laterality: N/A;   ESOPHAGOGASTRODUODENOSCOPY (EGD) WITH ESOPHAGEAL DILATION N/A 04/01/2013   IRC:VELFYBOFB at the gastroesophageal juction/multiple small polyps/mild gastritis   GIVENS CAPSULE STUDY N/A 06/07/2017   Frequent gastric erosions, normal small bowel mucosa.  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    Family History  Problem Relation Age of Onset   Colon polyps Neg Hx    Colon cancer Neg Hx     Social History   Socioeconomic History   Marital status: Married    Spouse name: [name]   Number of children: 3   Years of education: 16   Highest education level: Not on file  Occupational History   Not on file  Tobacco Use   Smoking status: Never    Passive exposure: Never   Smokeless tobacco: Never   Tobacco comments:    Never smoked   Vaping Use  Vaping status: Never Used  Substance and Sexual Activity   Alcohol use: Not Currently    Comment: occ wine   Drug use: No   Sexual activity: Not Currently  Other Topics Concern   Not on file  Social History Narrative      Lives with husband-51 years 952 in Aug 2021   Daughter is close by, 2 sons live further away   One IN [place],ONE IN [place], ONE BESIDE HER.        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   Enjoys: reading, young adult      Diet: eats all food groups    Caffeine:  coffee 1, tea daily soda-1 daily   Water: 1-2 cups      Wears seat belt    Does not use phone while driving   Smoke detectors at home    Licensed conveyancer -locked up      Left handed   One story home   Drinks caffeine   Social Determinants of Health   Financial Resource Strain: Low Risk  (12/21/2022)   Overall Financial Resource Strain (CARDIA)    Difficulty of Paying Living Expenses: Not hard at all  Food Insecurity: No Food Insecurity (12/21/2022)   Hunger Vital Sign    Worried About Running Out of Food in the Last Year: Never true    Ran Out of Food in the Last Year: Never true  Transportation Needs: No Transportation Needs (12/21/2022)   PRAPARE - Administrator, Civil Service (Medical): No    Lack of Transportation (Non-Medical): No  Physical Activity: Inactive (12/21/2022)   Exercise Vital Sign    Days of Exercise per Week: 0 days    Minutes of Exercise per Session: 0 min  Stress: Stress Concern Present (12/21/2022)   [employer] of Occupational Health - Occupational Stress Questionnaire    Feeling of Stress : To some extent  Social Connections: Moderately Integrated (12/21/2022)   Social Connection and Isolation Panel [NHANES]    Frequency of Communication with Friends and Family: More than three times a week    Frequency of Social Gatherings with Friends and Family: More than three times a week    Attends Religious Services: More than 4 times per year    Active Member of [employer] or Organizations: No    Attends Banker Meetings: Never    Marital Status: Married  Catering manager Violence: Not At Risk (12/21/2022)   Humiliation, Afraid, Rape, and Kick questionnaire    Fear of Current or Ex-Partner: No    Emotionally Abused: No    Physically Abused: No    Sexually Abused: No    Outpatient Medications Prior to Visit  Medication Sig Dispense Refill   acetaminophen (TYLENOL) 500 MG tablet Take 1,000 mg by mouth 2 (two) times daily as  needed for moderate pain or headache.     acyclovir (ZOVIRAX) 400 MG tablet TAKE ONE TABLET BY MOUTH TWICE DAILY 60 tablet 6   albuterol (VENTOLIN HFA) 108 (90 Base) MCG/ACT inhaler USE 2 PUFFS ONE TO TWO TIMES A DAY. 18 g 5   amLODipine (NORVASC) 10 MG tablet TAKE ONE TABLET BY MOUTH ONCE DAILY 90 tablet 0   ascorbic acid (VITAMIN C) 500 MG tablet Take 500 mg by mouth daily.     aspirin EC 81 MG tablet Take 81 mg by mouth at bedtime.     B Complex Vitamins (VITAMIN B COMPLEX) TABS Take 1 tablet  by mouth daily.     B Complex-Folic Acid (B COMPLEX VITAMINS, W/ FA,) CAPS Take 1 capsule by mouth daily.     bortezomib SQ (VELCADE) 3.5 MG injection Inject into the skin once.     Calcium Carbonate-Vitamin D (CALCIUM 600+D PO) Take 1 tablet by mouth 2 (two) times daily.     chlorthalidone (HYGROTON) 25 MG tablet Take 25 mg by mouth daily.     cholecalciferol (VITAMIN D3) 10 MCG (400 UNIT) TABS tablet Take 400 Units by mouth daily.     cholecalciferol 25 MCG (1000 UT) tablet Take by mouth.     cyanocobalamin (VITAMIN B12) 1000 MCG tablet Take by mouth.     cyclobenzaprine (FLEXERIL) 10 MG tablet TAKE ONE-HALF TABLET BY MOUTH TWICE DAILY AS NEEDED FOR MUSCLE SPASMS 20 tablet 0   Daratumumab-Hyaluronidase-fihj (DARZALEX FASPRO Nebo) Inject into the skin.     diclofenac Sodium (VOLTAREN) 1 % GEL Apply 4 g topically 4 (four) times daily. 50 g 0   fluticasone (FLONASE) 50 MCG/ACT nasal spray USE 2 SPRAYS IN EACH NOSTRIL ONCE DAILY 16 g 0   fluticasone-salmeterol (ADVAIR) 250-50 MCG/ACT AEPB Inhale into the lungs.     folic acid (FOLVITE) 1 MG tablet TAKE 1 TABLET BY MOUTH ONCE DAILY. 30 tablet 11   gabapentin (NEURONTIN) 100 MG capsule Take 100 mg by mouth 3 (three) times daily.     HYDROcodone-acetaminophen (NORCO/VICODIN) 5-325 MG tablet TAKE ONE TABLET BY MOUTH EVERY 8 HOURS AS NEEDED FOR MODERATE PAIN 60 tablet 0   Hyoscyamine Sulfate SL 0.125 MG SUBL      lenalidomide (REVLIMID) 10 MG capsule Take 1  capsule (10 mg total) by mouth daily. 21 days on, 7 days off every 28 days 21 capsule 0   lidocaine (HM LIDOCAINE PATCH) 4 % Place 1 patch onto the skin daily. 14 patch 0   lidocaine (XYLOCAINE) 2 % solution Take 2 teaspoons before meals and at bedtime as needed. May repeat every 4 hours for a maximum dose of 8 per day. 300 mL 0   Lidocaine HCl (XOLIDO) 2 % CREA Apply 1 application topically daily as needed (arthritisis).     loperamide (IMODIUM) 2 MG capsule Take 4 mg by mouth as needed. Take 2 tablets after the first loose stool and then 1 tablet after each loose stool.  Do not exceed 8 capsules within 24 hours.     losartan (COZAAR) 100 MG tablet      magnesium oxide (MAG-OX) 400 (240 Mg) MG tablet Take 1 tablet (400 mg total) by mouth 2 (two) times daily. 60 tablet 4   metoprolol tartrate (LOPRESSOR) 50 MG tablet Take 1 tablet (50 mg total) by mouth 2 (two) times daily. (Patient taking differently: Take 25 mg by mouth 2 (two) times daily.) 180 tablet 1   montelukast (SINGULAIR) 10 MG tablet Take 10 mg by mouth daily.     montelukast (SINGULAIR) 10 MG tablet Take 1 tablet (10 mg total) by mouth at bedtime. 30 tablet 3   MYRBETRIQ 50 MG TB24 tablet TAKE ONE TABLET BY MOUTH ONCE DAILY 30 tablet 1   nitroGLYCERIN (NITROSTAT) 0.4 MG SL tablet Place 0.4 mg under the tongue every 5 (five) minutes as needed for chest pain.     pantoprazole (PROTONIX) 40 MG tablet Take 40 mg by mouth daily.     potassium chloride (KLOR-CON M) 10 MEQ tablet Take 1 tablet (10 mEq total) by mouth daily for 21 days. 21 tablet 0  potassium chloride SA (KLOR-CON M) 20 MEQ tablet Take 1 tablet (20 mEq total) by mouth 3 (three) times daily. 21 tablet 0   primidone (MYSOLINE) 50 MG tablet Take by mouth.     Probiotic Product (GNP PROBIOTIC COLON SUPPORT) CAPS Take 1 capsule by mouth daily.     Saccharomyces boulardii (PROBIOTIC) 250 MG CAPS Take 1 capsule by mouth daily.     sertraline (ZOLOFT) 25 MG tablet Take 1 tablet (25 mg  total) by mouth daily. 30 tablet 3   simvastatin (ZOCOR) 40 MG tablet TAKE ONE TABLET BY MOUTH AT BEDTIME 90 tablet 1   tadalafil, PAH, (ALYQ) 20 MG tablet Take 2 tablets every day by oral route.     topiramate (TOPAMAX) 25 MG tablet Take by mouth.     warfarin (COUMADIN) 3 MG tablet Take 3 mg by mouth daily.     hydrochlorothiazide (MICROZIDE) 12.5 MG capsule Take 12.5 mg by mouth daily.     No facility-administered medications prior to visit.    Allergies  Allergen Reactions   Amoxicillin-Pot Clavulanate Other (See Comments)   Clarithromycin Other (See Comments)    Stomach problems   Erythromycin    Lisinopril Swelling   Amoxicillin Diarrhea    ROS Review of Systems  Constitutional:  Negative for chills and fever.  Eyes:  Negative for visual disturbance.  Respiratory:  Negative for chest tightness and shortness of breath.   Neurological:  Negative for dizziness and headaches.      Objective:    Physical Exam HENT:     Head: Normocephalic.     Mouth/Throat:     Mouth: Mucous membranes are moist.  Cardiovascular:     Rate and Rhythm: Normal rate.     Heart sounds: Normal heart sounds.  Pulmonary:     Effort: Pulmonary effort is normal.     Breath sounds: Normal breath sounds.  Neurological:     Mental Status: She is alert.     BP 107/68 (BP Location: Right Arm, Patient Position: Sitting, Cuff Size: Normal)   Pulse 82   Ht 5\' 3"  (1.6 m)   Wt 161 lb 0.6 oz (73 kg)   SpO2 93%   BMI 28.53 kg/m  Wt Readings from Last 3 Encounters:  01/12/23 161 lb 0.6 oz (73 kg)  01/08/23 160 lb 0.2 oz (72.6 kg)  01/04/23 160 lb (72.6 kg)    Lab Results  Component Value Date   TSH 0.860 01/04/2023   Lab Results  Component Value Date   WBC 4.4 01/08/2023   HGB 11.5 (L) 01/08/2023   HCT 34.2 (L) 01/08/2023   MCV 104.3 (H) 01/08/2023   PLT 63 (L) 01/08/2023   Lab Results  Component Value Date   NA 139 01/08/2023   K 2.9 (L) 01/08/2023   CO2 27 01/08/2023   GLUCOSE  83 01/08/2023   BUN 23 01/08/2023   CREATININE 1.19 (H) 01/08/2023   BILITOT 0.8 01/08/2023   ALKPHOS 46 01/08/2023   AST 24 01/08/2023   ALT 22 01/08/2023   PROT 6.2 (L) 01/08/2023   ALBUMIN 3.9 01/08/2023   CALCIUM 8.1 (L) 01/08/2023   ANIONGAP 8 01/08/2023   EGFR 54 (L) 01/04/2023   Lab Results  Component Value Date   CHOL 146 06/27/2022   Lab Results  Component Value Date   HDL 65 06/27/2022   Lab Results  Component Value Date   LDLCALC 62 06/27/2022   Lab Results  Component Value Date   TRIG 102  06/27/2022   Lab Results  Component Value Date   CHOLHDL 2.2 06/27/2022   Lab Results  Component Value Date   HGBA1C 5.2 06/27/2022      Assessment & Plan:  Hypokalemia Assessment & Plan: The patient has a history of myocardial infarction (MI) in 2014 and is currently following up with cardiology in [place]. She is taking chlorthalidone 12.5 mg daily. She had questions regarding her potassium supplementation, specifically whether to take the 20 mEq BID prescribed by cardiology or the 10 mEq daily recently prescribed. I advised her to take the potassium 10 mEq daily and to increase her intake of potassium-rich foods, such as bananas, potatoes, spinach, oranges, beans, and avocados.  Orders: -     BMP8+EGFR  Tremors of nervous system Assessment & Plan: The patient is currently following up with neurology in [place] and had questions regarding the use of Topamax. I informed her that the medication is prescribed to be taken once daily for 7 days, after which the dosage should be increased to twice daily to address tremors and jerks. She was encouraged to continue her follow-up appointments with neurology for ongoing management and evaluation.   Note: This chart has been completed using Engineer, civil (consulting) software, and while attempts have been made to ensure accuracy, certain words and phrases may not be transcribed as intended.    Follow-up: No follow-ups on  file.   Gilmore Laroche, FNP

## 2023-01-12 NOTE — Patient Instructions (Addendum)
I appreciate the opportunity to provide care to you today!    Follow up:  01/30/2023  Labs: please stop by the lab today to get your blood drawn (BMP)   Please take potassium 10 mEq once daily and consider increasing your intake of potassium-rich foods. Potassium is crucial for maintaining proper muscle and nerve function, as well as for fluid balance in the body. To help boost your potassium levels, incorporate the following foods into your diet:  -Bananas: A convenient and classic source of potassium. --Sweet Potatoes: Rich in potassium and other essential nutrients. -Spinach: A versatile leafy green with a high potassium content. -Avocados: Not only high in potassium but also beneficial healthy fats. -Oranges: Provide potassium and are also a good source of vitamin C. -Beans: Varieties such as kidney beans, black beans, and white beans are high in potassium. -Tomatoes: Both fresh tomatoes and tomato products like sauce and paste are rich in potassium. -Melons: Cantaloupe and honeydew are excellent sources. -Potatoes: White and red potatoes are high in potassium, particularly when the skin is consumed. -Yogurt: Supplies potassium along with beneficial probiotics.  Tremors Management:  Medication Instructions: Please take Topamax 25 mg once daily until tomorrow, January 13, 2023. Starting on Saturday, January 14, 2023, increase the dosage to Topamax 25 mg twice daily to manage tremors and jerks.    Please continue to a heart-healthy diet and increase your physical activities. Try to exercise for at least five days a week.    It was a pleasure to see you and I look forward to continuing to work together on your health and well-being. Please do not hesitate to call the office if you need care or have questions about your care.  In case of emergency, please visit the Emergency Department for urgent care, or contact our clinic at [phone] to schedule an appointment. We're here to  help you!   Have a wonderful day and week. With Gratitude, Gilmore Laroche MSN, FNP-BC

## 2023-01-12 NOTE — Assessment & Plan Note (Addendum)
The patient is currently following up with neurology in [place] and had questions regarding the use of Topamax. I informed her that the medication is prescribed to be taken once daily for 7 days, after which the dosage should be increased to twice daily to address tremors and jerks. She was encouraged to continue her follow-up appointments with neurology for ongoing management and evaluation.

## 2023-01-13 ENCOUNTER — Other Ambulatory Visit: Payer: Self-pay

## 2023-01-13 ENCOUNTER — Encounter: Payer: Self-pay | Admitting: Hematology

## 2023-01-13 LAB — BMP8+EGFR
BUN/Creatinine Ratio: 20 (ref 12–28)
BUN: 20 mg/dL (ref 8–27)
CO2: 25 mmol/L (ref 20–29)
Calcium: 8.8 mg/dL (ref 8.7–10.3)
Chloride: 104 mmol/L (ref 96–106)
Creatinine, Ser: 1 mg/dL (ref 0.57–1.00)
Glucose: 89 mg/dL (ref 70–99)
Potassium: 3.6 mmol/L (ref 3.5–5.2)
Sodium: 143 mmol/L (ref 134–144)
eGFR: 59 mL/min/{1.73_m2} — ABNORMAL LOW (ref >59)

## 2023-01-13 MED ORDER — LENALIDOMIDE 10 MG PO CAPS: ORAL_CAPSULE | ORAL | 0 refills | Status: DC

## 2023-01-13 NOTE — Progress Notes (Signed)
Please inform the patient her labs are stable

## 2023-01-13 NOTE — Progress Notes (Signed)
Her potassium are within normal limit.

## 2023-01-13 NOTE — Telephone Encounter (Signed)
Chart reviewed. Revlimid refilled per last office note with Dr. Katragadda.  

## 2023-01-15 ENCOUNTER — Emergency Department (HOSPITAL_COMMUNITY): Payer: Medicare Other

## 2023-01-15 ENCOUNTER — Other Ambulatory Visit: Payer: Self-pay

## 2023-01-15 ENCOUNTER — Encounter (HOSPITAL_COMMUNITY): Payer: Self-pay | Admitting: Emergency Medicine

## 2023-01-15 ENCOUNTER — Emergency Department (HOSPITAL_COMMUNITY)
Admission: EM | Admit: 2023-01-15 | Discharge: 2023-01-15 | Disposition: A | Discharge status: Home / Self Care | Payer: Medicare Other | Attending: Emergency Medicine | Admitting: Emergency Medicine

## 2023-01-15 DIAGNOSIS — I2510 Atherosclerotic heart disease of native coronary artery without angina pectoris: Secondary | ICD-10-CM | POA: Diagnosis not present

## 2023-01-15 DIAGNOSIS — R4182 Altered mental status, unspecified: Secondary | ICD-10-CM | POA: Diagnosis present

## 2023-01-15 DIAGNOSIS — E162 Hypoglycemia, unspecified: Secondary | ICD-10-CM | POA: Diagnosis not present

## 2023-01-15 DIAGNOSIS — Z7901 Long term (current) use of anticoagulants: Secondary | ICD-10-CM | POA: Insufficient documentation

## 2023-01-15 DIAGNOSIS — R410 Disorientation, unspecified: Secondary | ICD-10-CM | POA: Insufficient documentation

## 2023-01-15 DIAGNOSIS — I10 Essential (primary) hypertension: Secondary | ICD-10-CM | POA: Diagnosis not present

## 2023-01-15 DIAGNOSIS — Z79899 Other long term (current) drug therapy: Secondary | ICD-10-CM | POA: Diagnosis not present

## 2023-01-15 DIAGNOSIS — Z950 Presence of cardiac pacemaker: Secondary | ICD-10-CM | POA: Diagnosis not present

## 2023-01-15 DIAGNOSIS — Z7982 Long term (current) use of aspirin: Secondary | ICD-10-CM | POA: Diagnosis not present

## 2023-01-15 LAB — CBC
HCT: 36.2 % (ref 36.0–46.0)
Hemoglobin: 12.2 g/dL (ref 12.0–15.0)
MCH: 34.9 pg — ABNORMAL HIGH (ref 26.0–34.0)
MCHC: 33.7 g/dL (ref 30.0–36.0)
MCV: 103.4 fL — ABNORMAL HIGH (ref 80.0–100.0)
Platelets: 81 10*3/uL — ABNORMAL LOW (ref 150–400)
RBC: 3.5 MIL/uL — ABNORMAL LOW (ref 3.87–5.11)
RDW: 15.9 % — ABNORMAL HIGH (ref 11.5–15.5)
WBC: 4.9 10*3/uL (ref 4.0–10.5)
nRBC: 0 % (ref 0.0–0.2)

## 2023-01-15 LAB — URINALYSIS, ROUTINE W REFLEX MICROSCOPIC
Bilirubin Urine: NEGATIVE
Glucose, UA: 50 mg/dL — AB
Hgb urine dipstick: NEGATIVE
Ketones, ur: NEGATIVE mg/dL
Leukocytes,Ua: NEGATIVE
Nitrite: NEGATIVE
Protein, ur: NEGATIVE mg/dL
Specific Gravity, Urine: 1.006 (ref 1.005–1.030)
pH: 7 (ref 5.0–8.0)

## 2023-01-15 LAB — CBG MONITORING, ED
Glucose-Capillary: 68 mg/dL — ABNORMAL LOW (ref 70–99)
Glucose-Capillary: 75 mg/dL (ref 70–99)
Glucose-Capillary: 134 mg/dL — ABNORMAL HIGH (ref 70–99)
Glucose-Capillary: 67 mg/dL — ABNORMAL LOW (ref 70–99)
Glucose-Capillary: 122 mg/dL — ABNORMAL HIGH (ref 70–99)

## 2023-01-15 LAB — COMPREHENSIVE METABOLIC PANEL
ALT: 20 U/L (ref 0–44)
AST: 28 U/L (ref 15–41)
Albumin: 4.3 g/dL (ref 3.5–5.0)
Alkaline Phosphatase: 46 U/L (ref 38–126)
Anion gap: 11 (ref 5–15)
BUN: 18 mg/dL (ref 8–23)
CO2: 27 mmol/L (ref 22–32)
Calcium: 8.9 mg/dL (ref 8.9–10.3)
Chloride: 98 mmol/L (ref 98–111)
Creatinine, Ser: 0.96 mg/dL (ref 0.44–1.00)
GFR, Estimated: >60 mL/min (ref >60)
Glucose, Bld: 93 mg/dL (ref 70–99)
Potassium: 3.6 mmol/L (ref 3.5–5.1)
Sodium: 136 mmol/L (ref 135–145)
Total Bilirubin: 1 mg/dL (ref 0.3–1.2)
Total Protein: 7.1 g/dL (ref 6.5–8.1)

## 2023-01-15 LAB — PROTIME-INR
INR: 2.7 — ABNORMAL HIGH (ref 0.8–1.2)
Prothrombin Time: 29.2 seconds — ABNORMAL HIGH (ref 11.4–15.2)

## 2023-01-15 MED ORDER — DEXTROSE 50 % IV SOLN
INTRAVENOUS | Status: AC
  Administered 2023-01-15: 50 mL via INTRAVENOUS
  Filled 2023-01-15: qty 50

## 2023-01-15 MED ORDER — DEXTROSE 50 % IV SOLN: 50.0000 mL | Freq: Once | INTRAVENOUS | Status: AC

## 2023-01-15 NOTE — ED Triage Notes (Signed)
Per family pt is more confused since yesterday.

## 2023-01-15 NOTE — ED Notes (Signed)
CBG obtained during triage

## 2023-01-15 NOTE — ED Provider Notes (Signed)
[place] EMERGENCY DEPARTMENT AT [facility] Provider Note   CSN: [id number] Arrival date & time: 01/15/23  1234     History  Chief Complaint  Patient presents with   Altered Mental Status    Courtney Rogers is a 72 y.o. female.  The history is provided by the patient and medical records. No language interpreter was used.  Altered Mental Status    72 year old female with significant history of CAD, has cardiac pacemaker currently on warfarin, history of PE, IBS, hypertension, anxiety, cancer brought in via EMS from home with concerns of altered mental status.  Patient report for the past 2 days she feels as if she is having more trouble with her memory.  States she is having trouble placing her medications into her medicine box and also felt she is more forgetful than usual.  She report feeling frustrated because of this.  She does not endorse any headache, chest pain, trouble breathing, abdominal pain, back pain, nausea vomiting diarrhea.  She did notice some increased urinary frequency but this is ongoing for several weeks.  She denies burning on urination.  Denies any recent medication changes.  Denies feeling depressed.  No history of diabetes.  Patient also denies having any focal numbness or focal weakness.  Home Medications Prior to Admission medications   Medication Sig Start Date End Date Taking? Authorizing Provider  acetaminophen (TYLENOL) 500 MG tablet Take 1,000 mg by mouth 2 (two) times daily as needed for moderate pain or headache.    [provider]  acyclovir (ZOVIRAX) 400 MG tablet TAKE ONE TABLET BY MOUTH TWICE DAILY 12/20/22   Doreatha Massed, MD  albuterol (VENTOLIN HFA) 108 (90 Base) MCG/ACT inhaler USE 2 PUFFS ONE TO TWO TIMES A DAY. 05/11/22   Gilmore Laroche, FNP  amLODipine (NORVASC) 10 MG tablet TAKE ONE TABLET BY MOUTH ONCE DAILY 10/18/22   Doreatha Massed, MD  ascorbic acid (VITAMIN C) 500 MG tablet Take 500 mg by mouth daily.  02/07/22   [provider]  aspirin EC 81 MG tablet Take 81 mg by mouth at bedtime.    [provider]  B Complex Vitamins (VITAMIN B COMPLEX) TABS Take 1 tablet by mouth daily.    [provider]  B Complex-Folic Acid (B COMPLEX VITAMINS, W/ FA,) CAPS Take 1 capsule by mouth daily. 09/09/21   [provider]  bortezomib SQ (VELCADE) 3.5 MG injection Inject into the skin once. 08/10/21   [provider]  Calcium Carbonate-Vitamin D (CALCIUM 600+D PO) Take 1 tablet by mouth 2 (two) times daily.    [provider]  chlorthalidone (HYGROTON) 25 MG tablet Take 25 mg by mouth daily. 06/04/22   [provider]  cholecalciferol (VITAMIN D3) 10 MCG (400 UNIT) TABS tablet Take 400 Units by mouth daily. 09/30/22   [provider]  cholecalciferol 25 MCG (1000 UT) tablet Take by mouth. 10/15/20   [provider]  cyanocobalamin (VITAMIN B12) 1000 MCG tablet Take by mouth. 10/12/20   [provider]  cyclobenzaprine (FLEXERIL) 10 MG tablet TAKE ONE-HALF TABLET BY MOUTH TWICE DAILY AS NEEDED FOR MUSCLE SPASMS 12/23/22   Gilmore Laroche, FNP  Daratumumab-Hyaluronidase-fihj (DARZALEX FASPRO [place]) Inject into the skin. 08/10/21   [provider]  diclofenac Sodium (VOLTAREN) 1 % GEL Apply 4 g topically 4 (four) times daily. 11/19/21   Gilmore Laroche, FNP  fluticasone (FLONASE) 50 MCG/ACT nasal spray USE 2 SPRAYS IN EACH NOSTRIL ONCE DAILY 11/28/22   Zarwolo,  Malachi Bonds, FNP  fluticasone-salmeterol (ADVAIR) 250-50 MCG/ACT AEPB Inhale into the lungs. 10/12/20   [provider]  folic acid (FOLVITE) 1 MG tablet TAKE 1 TABLET BY MOUTH ONCE DAILY. 03/15/22   Doreatha Massed, MD  gabapentin (NEURONTIN) 100 MG capsule Take 100 mg by mouth 3 (three) times daily. 05/07/19   [provider]  HYDROcodone-acetaminophen (NORCO/VICODIN) 5-325 MG tablet TAKE ONE TABLET BY MOUTH EVERY 8 HOURS AS NEEDED FOR MODERATE PAIN 10/10/22    Doreatha Massed, MD  Hyoscyamine Sulfate SL 0.125 MG SUBL     [provider]  lenalidomide (REVLIMID) 10 MG capsule Take 1 capsule (10 mg total) by mouth daily. 21 days on, 7 days off every 28 days 01/13/23   Doreatha Massed, MD  lidocaine (HM LIDOCAINE PATCH) 4 % Place 1 patch onto the skin daily. 03/21/22   Rondel Baton, MD  lidocaine (XYLOCAINE) 2 % solution Take 2 teaspoons before meals and at bedtime as needed. May repeat every 4 hours for a maximum dose of 8 per day. 05/10/22   Tiffany Kocher, PA-C  Lidocaine HCl (XOLIDO) 2 % CREA Apply 1 application topically daily as needed (arthritisis).    [provider]  loperamide (IMODIUM) 2 MG capsule Take 4 mg by mouth as needed. Take 2 tablets after the first loose stool and then 1 tablet after each loose stool.  Do not exceed 8 capsules within 24 hours.    [provider]  losartan (COZAAR) 100 MG tablet  01/16/21   [provider]  magnesium oxide (MAG-OX) 400 (240 Mg) MG tablet Take 1 tablet (400 mg total) by mouth 2 (two) times daily. 10/03/22   Doreatha Massed, MD  metoprolol tartrate (LOPRESSOR) 50 MG tablet Take 1 tablet (50 mg total) by mouth 2 (two) times daily. Patient taking differently: Take 25 mg by mouth 2 (two) times daily. 10/07/21   Paseda, Baird Kay, FNP  montelukast (SINGULAIR) 10 MG tablet Take 10 mg by mouth daily.    [provider]  montelukast (SINGULAIR) 10 MG tablet Take 1 tablet (10 mg total) by mouth at bedtime. 09/30/22   Gilmore Laroche, FNP  MYRBETRIQ 50 MG TB24 tablet TAKE ONE TABLET BY MOUTH ONCE DAILY 12/15/22   Gilmore Laroche, FNP  nitroGLYCERIN (NITROSTAT) 0.4 MG SL tablet Place 0.4 mg under the tongue every 5 (five) minutes as needed for chest pain.    [provider]  pantoprazole (PROTONIX) 40 MG tablet Take 40 mg by mouth daily. 08/10/20   [provider]  potassium chloride (KLOR-CON M) 10 MEQ tablet Take 1 tablet (10 mEq total)  by mouth daily for 21 days. 01/04/23 01/25/23  Del Nigel Berthold, FNP  potassium chloride SA (KLOR-CON M) 20 MEQ tablet Take 1 tablet (20 mEq total) by mouth 3 (three) times daily. 09/27/21   Dione Booze, MD  primidone (MYSOLINE) 50 MG tablet Take by mouth. 03/31/21   [provider]  Probiotic Product (GNP PROBIOTIC COLON SUPPORT) CAPS Take 1 capsule by mouth daily. 07/08/21   [provider]  Saccharomyces boulardii (PROBIOTIC) 250 MG CAPS Take 1 capsule by mouth daily. 12/09/22   [provider]  sertraline (ZOLOFT) 25 MG tablet Take 1 tablet (25 mg total) by mouth daily. 12/19/22   Gilmore Laroche, FNP  simvastatin (ZOCOR) 40 MG tablet TAKE ONE TABLET BY MOUTH AT BEDTIME 10/24/22   Gilmore Laroche, FNP  tadalafil, PAH, (ALYQ) 20 MG tablet Take 2 tablets every day by oral route.  [provider]  topiramate (TOPAMAX) 25 MG tablet Take by mouth. 01/06/23   [provider]  warfarin (COUMADIN) 3 MG tablet Take 3 mg by mouth daily. 06/23/21   [provider]      Allergies    Amoxicillin-pot clavulanate, Clarithromycin, Erythromycin, Lisinopril, and Amoxicillin    Review of Systems   Review of Systems  All other systems reviewed and are negative.   Physical Exam Updated Vital Signs BP 107/61 (BP Location: Right Arm)   Pulse 63   Temp 98.3 F (36.8 C)   Resp 17   Ht 5\' 3"  (1.6 m)   Wt 73 kg   SpO2 99%   BMI 28.51 kg/m  Physical Exam Vitals and nursing note reviewed.  Constitutional:      General: She is not in acute distress.    Appearance: She is well-developed.  HENT:     Head: Atraumatic.  Eyes:     Conjunctiva/sclera: Conjunctivae normal.  Cardiovascular:     Rate and Rhythm: Normal rate and regular rhythm.     Pulses: Normal pulses.     Heart sounds: Normal heart sounds.  Pulmonary:     Effort: Pulmonary effort is normal.  Abdominal:     Palpations: Abdomen is soft.     Tenderness: There is no abdominal tenderness.   Musculoskeletal:     Cervical back: Neck supple.  Skin:    Findings: No rash.  Neurological:     Mental Status: She is alert and oriented to person, place, and time.     Comments: Neurologic exam:  Speech clear, pupils equal round reactive to light, extraocular movements intact  Normal peripheral visual fields Cranial nerves III through XII normal including no facial droop Follows commands, moves all extremities x4, normal strength to bilateral upper and lower extremities at all major muscle groups including grip Sensation normal to light touch and pinprick Coordination intact, no limb ataxia, finger-nose-finger normal Rapid alternating movements normal No pronator drift Gait normal    Psychiatric:        Mood and Affect: Mood normal.     ED Results / Procedures / Treatments   Labs (all labs ordered are listed, but only abnormal results are displayed) Labs Reviewed  CBC - Abnormal; Notable for the following components:      Result Value   RBC 3.50 (*)    MCV 103.4 (*)    MCH 34.9 (*)    RDW 15.9 (*)    Platelets 81 (*)    All other components within normal limits  PROTIME-INR - Abnormal; Notable for the following components:   Prothrombin Time 29.2 (*)    INR 2.7 (*)    All other components within normal limits  URINALYSIS, ROUTINE W REFLEX MICROSCOPIC - Abnormal; Notable for the following components:   Glucose, UA 50 (*)    All other components within normal limits  CBG MONITORING, ED - Abnormal; Notable for the following components:   Glucose-Capillary 68 (*)    All other components within normal limits  CBG MONITORING, ED - Abnormal; Notable for the following components:   Glucose-Capillary 134 (*)    All other components within normal limits  CBG MONITORING, ED - Abnormal; Notable for the following components:   Glucose-Capillary 67 (*)    All other components within normal limits  CBG MONITORING, ED - Abnormal; Notable for the following components:    Glucose-Capillary 122 (*)    All other components within normal limits  COMPREHENSIVE METABOLIC  PANEL  CBG MONITORING, ED    EKG EKG Interpretation Date/Time:  Sunday January 15 2023 13:20:10 EDT Ventricular Rate:  62 PR Interval:  149 QRS Duration:  248 QT Interval:  562 QTC Calculation: 571 R Axis:   -55  Text Interpretation: VENTRICULAR PACED RHYTHM Abnormal ECG Confirmed by Gerhard Munch [phone]) on 01/15/2023 1:54:04 PM  Radiology CT Head Wo Contrast  Result Date: 01/15/2023 CLINICAL DATA:  Altered mental status. EXAM: CT HEAD WITHOUT CONTRAST TECHNIQUE: Contiguous axial images were obtained from the base of the skull through the vertex without intravenous contrast. RADIATION DOSE REDUCTION: This exam was performed according to the departmental dose-optimization program which includes automated exposure control, adjustment of the mA and/or kV according to patient size and/or use of iterative reconstruction technique. COMPARISON:  Head CT 12/10/2022 FINDINGS: Brain: There is no evidence of an acute infarct, intracranial hemorrhage, mass, midline shift, or extra-axial fluid collection. Mild cerebral atrophy is within normal limits for age. A partially empty sella is unchanged. Vascular: Calcified atherosclerosis at the skull base. No hyperdense vessel. Skull: No acute fracture or suspicious osseous lesion. Sinuses/Orbits: Partially visualized mild-to-moderate mucosal thickening in the right maxillary sinus. Clear mastoid air cells. Unremarkable orbits. Other: None. IMPRESSION: No evidence of acute intracranial abnormality. Electronically Signed   By: Sebastian Ache M.D.   On: 01/15/2023 15:19    Procedures .Critical Care  Performed by: Fayrene Helper, PA-C Authorized by: Fayrene Helper, PA-C   Critical care provider statement:    Critical care time (minutes):  41   Critical care was time spent personally by me on the following activities:  Development of treatment plan with patient or  surrogate, discussions with consultants, evaluation of patient's response to treatment, examination of patient, ordering and review of laboratory studies, ordering and review of radiographic studies, ordering and performing treatments and interventions, pulse oximetry, re-evaluation of patient's condition and review of old charts     Medications Ordered in ED Medications  dextrose 50 % solution 50 mL (50 mLs Intravenous Given 01/15/23 1312)    ED Course/ Medical Decision Making/ A&P                                 Medical Decision Making Amount and/or Complexity of Data Reviewed Labs: ordered. Radiology: ordered. ECG/medicine tests: ordered.  Risk Prescription drug management.   BP 107/61 (BP Location: Right Arm)   Pulse 63   Temp 98.3 F (36.8 C)   Resp 17   Ht 5\' 3"  (1.6 m)   Wt 73 kg   SpO2 99%   BMI 28.51 kg/m   45:15 PM  72 year old female with significant history of CAD, has cardiac pacemaker currently on warfarin, history of PE, IBS, hypertension, anxiety, cancer brought in via EMS from home with concerns of altered mental status.  Patient report for the past 2 days she feels as if she is having more trouble with her memory.  States she is having trouble placing her medications into her medicine box and also felt she is more forgetful than usual.  She report feeling frustrated because of this.  She does not endorse any headache, chest pain, trouble breathing, abdominal pain, back pain, nausea vomiting diarrhea.  She did notice some increased urinary frequency but this is ongoing for several weeks.  She denies burning on urination.  Denies any recent medication changes.  Denies feeling depressed.  No history of diabetes.  Patient also denies  having any focal numbness or focal weakness.  On exam, patient is laying in bed appears to be in no acute discomfort.  Heart with normal rate and rhythm, lungs are clear to auscultation bilaterally abdomen is soft nontender she has equal  strength throughout all 4 extremities she is able to ambulate without using assistance.  She does not have any focal neurodeficit.  She is alert and oriented x 3.  She is unable to recall the current president.  -Labs ordered, independently viewed and interpreted by me.  Labs remarkable for CBG 50, improves with food/drink.  The remainder of the labs are reassuring.  -The patient was maintained on a cardiac monitor.  I personally viewed and interpreted the cardiac monitored which showed an underlying rhythm of: paced rhythm.  Pacer was interrogated and no abnormal event documented.   -Imaging independently viewed and interpreted by me and I agree with radiologist's interpretation.  Result remarkable for head CT unremarkable -This patient presents to the ED for concern of altered mental status, this involves an extensive number of treatment options, and is a complaint that carries with it a high risk of complications and morbidity.  The differential diagnosis includes hypoglycemia, dehydration, cardiac arrhythmia, stroke, infection -Co morbidities that complicate the patient evaluation includes cad, anxiety, HTN, PE, CA -Treatment includes food, dextrose, fluid -Reevaluation of the patient after these medicines showed that the patient improved -PCP office notes or outside notes reviewed -Discussion with specialist Dr. Jeraldine Loots -Escalation to admission/observation considered: patients feels much better, is comfortable with discharge, and will follow up with PCP -Prescription medication considered, patient comfortable with OTC meds -Social Determinant of Health considered which includes depression and lack of mobility  5:16 PM Patient's blood sugar has been fluctuating throughout this ER visit.  She required multiple blood sugar check and intervention with food, drink, and D50.  She is currently not on any oral antiglycemic medication.  She is mentating appropriately.  Family member is available at  bedside.  At this time no acute emergent medical condition requiring hospital admission.  Encourage patient to make sure to eat appropriately and follow-up closely with her doctor for blood sugar recheck and reassessment.  Gave patient strict return precaution.  Patient voiced understanding and agrees with plan.         Final Clinical Impression(s) / ED Diagnoses Final diagnoses:  Hypoglycemia  Disorientation    Rx / DC Orders ED Discharge Orders     None         Fayrene Helper, PA-C 01/15/23 1719    Gerhard Munch, MD 01/18/23 [phone]

## 2023-01-15 NOTE — Discharge Instructions (Addendum)
Your confusion may be due to low blood sugar.  Please make sure to eat your new appropriately, and follow-up closely with your doctor for lab recheck to ensure your blood sugar is within normal range.  Return to the ER if your symptoms worsen or if you have other concern.

## 2023-01-15 NOTE — ED Notes (Signed)
Pt CBG 67. PA notified. Pt given sandwich, apple sauce, and juice at this time.

## 2023-01-15 NOTE — ED Notes (Signed)
Provided orange juice.

## 2023-01-15 NOTE — ED Notes (Signed)
Patient transported to CT 

## 2023-01-16 ENCOUNTER — Other Ambulatory Visit: Payer: Self-pay

## 2023-01-16 MED ORDER — POTASSIUM CHLORIDE CRYS ER 20 MEQ PO TBCR: 20.0000 meq | EXTENDED_RELEASE_TABLET | Freq: Two times a day (BID) | ORAL | Status: DC

## 2023-01-18 ENCOUNTER — Inpatient Hospital Stay: Payer: Medicare Other

## 2023-01-18 VITALS — BP 111/66 | HR 87 | Temp 97.0°F | Resp 18 | Wt 156.2 lb

## 2023-01-18 DIAGNOSIS — D509 Iron deficiency anemia, unspecified: Secondary | ICD-10-CM

## 2023-01-18 DIAGNOSIS — Z5112 Encounter for antineoplastic immunotherapy: Secondary | ICD-10-CM | POA: Diagnosis not present

## 2023-01-18 DIAGNOSIS — C9000 Multiple myeloma not having achieved remission: Secondary | ICD-10-CM

## 2023-01-18 LAB — CBC WITH DIFFERENTIAL/PLATELET
Abs Immature Granulocytes: 0.01 10*3/uL (ref 0.00–0.07)
Basophils Absolute: 0.1 10*3/uL (ref 0.0–0.1)
Basophils Relative: 1 %
Eosinophils Absolute: 0.4 10*3/uL (ref 0.0–0.5)
Eosinophils Relative: 9 %
HCT: 36.3 % (ref 36.0–46.0)
Hemoglobin: 12.1 g/dL (ref 12.0–15.0)
Immature Granulocytes: 0 %
Lymphocytes Relative: 25 %
Lymphs Abs: 1.1 10*3/uL (ref 0.7–4.0)
MCH: 34.6 pg — ABNORMAL HIGH (ref 26.0–34.0)
MCHC: 33.3 g/dL (ref 30.0–36.0)
MCV: 103.7 fL — ABNORMAL HIGH (ref 80.0–100.0)
Monocytes Absolute: 0.7 10*3/uL (ref 0.1–1.0)
Monocytes Relative: 15 %
Neutro Abs: 2.2 10*3/uL (ref 1.7–7.7)
Neutrophils Relative %: 50 %
Platelets: 101 10*3/uL — ABNORMAL LOW (ref 150–400)
RBC: 3.5 MIL/uL — ABNORMAL LOW (ref 3.87–5.11)
RDW: 15.8 % — ABNORMAL HIGH (ref 11.5–15.5)
WBC: 4.4 10*3/uL (ref 4.0–10.5)
nRBC: 0 % (ref 0.0–0.2)

## 2023-01-18 LAB — COMPREHENSIVE METABOLIC PANEL
ALT: 19 U/L (ref 0–44)
AST: 22 U/L (ref 15–41)
Albumin: 4.3 g/dL (ref 3.5–5.0)
Alkaline Phosphatase: 48 U/L (ref 38–126)
Anion gap: 10 (ref 5–15)
BUN: 19 mg/dL (ref 8–23)
CO2: 27 mmol/L (ref 22–32)
Calcium: 8.6 mg/dL — ABNORMAL LOW (ref 8.9–10.3)
Chloride: 99 mmol/L (ref 98–111)
Creatinine, Ser: 1 mg/dL (ref 0.44–1.00)
GFR, Estimated: 59 mL/min — ABNORMAL LOW (ref >60)
Glucose, Bld: 91 mg/dL (ref 70–99)
Potassium: 2.8 mmol/L — ABNORMAL LOW (ref 3.5–5.1)
Sodium: 136 mmol/L (ref 135–145)
Total Bilirubin: 0.8 mg/dL (ref 0.3–1.2)
Total Protein: 7.1 g/dL (ref 6.5–8.1)

## 2023-01-18 LAB — MAGNESIUM: Magnesium: 2.2 mg/dL (ref 1.7–2.4)

## 2023-01-18 MED ORDER — PROCHLORPERAZINE MALEATE 10 MG PO TABS
10.0000 mg | ORAL_TABLET | Freq: Once | ORAL | Status: AC
  Administered 2023-01-18: 10 mg via ORAL
  Filled 2023-01-18: qty 1

## 2023-01-18 MED ORDER — DENOSUMAB 120 MG/1.7ML ~~LOC~~ SOLN
120.0000 mg | Freq: Once | SUBCUTANEOUS | Status: AC
  Administered 2023-01-18: 120 mg via SUBCUTANEOUS
  Filled 2023-01-18: qty 1.7

## 2023-01-18 MED ORDER — BORTEZOMIB CHEMO SQ INJECTION 3.5 MG (2.5MG/ML)
0.7000 mg/m2 | Freq: Once | INTRAMUSCULAR | Status: AC
  Administered 2023-01-18: 1.25 mg via SUBCUTANEOUS
  Filled 2023-01-18: qty 0.5

## 2023-01-18 MED ORDER — POTASSIUM CHLORIDE CRYS ER 20 MEQ PO TBCR
40.0000 meq | EXTENDED_RELEASE_TABLET | Freq: Once | ORAL | Status: AC
  Administered 2023-01-18: 40 meq via ORAL
  Filled 2023-01-18: qty 2

## 2023-01-18 NOTE — Progress Notes (Signed)
Patient presents today for Velcade injection. Labs pending.   Lab within parameters for treatment. Potassium 2.8. Dr. Ellin Saba standing orders followed. Patient will receive 40 mEq PO potassium x 1 dose today. Calcium 8.6. Creatinine 1.0. Patient states she takes Revlimid as prescribed with no side effects noted. Patient takes Cholecalciferol 25 mcg daily. Patient denies jaw pain, and any upcoming dental work.   Velcade and Rivka Barbara given today per MD orders. Tolerated  without adverse affects. Vital signs stable. No complaints at this time. Discharged from clinic ambulatory in stable condition. Alert and oriented x 3. F/U with [facility] as scheduled.

## 2023-01-18 NOTE — Patient Instructions (Signed)
[facility]  Discharge Instructions: Thank you for choosing [facility] to provide your oncology and hematology care.  If you have a lab appointment with the Cancer Center - please note that after April 8th, 2024, all labs will be drawn in the cancer center.  You do not have to check in or register with the main entrance as you have in the past but will complete your check-in in the cancer center.  Wear comfortable clothing and clothing appropriate for easy access to any Portacath or PICC line.   We strive to give you quality time with your provider. You may need to reschedule your appointment if you arrive late (15 or more minutes).  Arriving late affects you and other patients whose appointments are after yours.  Also, if you miss three or more appointments without notifying the office, you may be dismissed from the clinic at the provider's discretion.      For prescription refill requests, have your pharmacy contact our office and allow 72 hours for refills to be completed.    Today you received the following chemotherapy and/or immunotherapy agents Velcade injection, Xgeva injection.Denosumab Injection (Oncology) What is this medication? DENOSUMAB (den oh SUE mab) prevents weakened bones caused by cancer. It may also be used to treat noncancerous bone tumors that cannot be removed by surgery. It can also be used to treat high calcium levels in the blood caused by cancer. It works by blocking a protein that causes bones to break down quickly. This slows down the release of calcium from bones, which lowers calcium levels in your blood. It also makes your bones stronger and less likely to break (fracture). This medicine may be used for other purposes; ask your health care provider or pharmacist if you have questions. COMMON BRAND NAME(S): XGEVA What should I tell my care team before I take this medication? They need to know if you have any of these  conditions: Dental disease Having surgery or tooth extraction Infection Kidney disease Low levels of calcium or vitamin D in the blood Malnutrition On hemodialysis Skin conditions or sensitivity Thyroid or parathyroid disease An unusual reaction to denosumab, other medications, foods, dyes, or preservatives Pregnant or trying to get pregnant Breast-feeding How should I use this medication? This medication is for injection under the skin. It is given by your care team in a hospital or clinic setting. A special MedGuide will be given to you before each treatment. Be sure to read this information carefully each time. Talk to your care team about the use of this medication in children. While it may be prescribed for children as young as 13 years for selected conditions, precautions do apply. Overdosage: If you think you have taken too much of this medicine contact a poison control center or emergency room at once. NOTE: This medicine is only for you. Do not share this medicine with others. What if I miss a dose? Keep appointments for follow-up doses. It is important not to miss your dose. Call your care team if you are unable to keep an appointment. What may interact with this medication? Do not take this medication with any of the following: Other medications containing denosumab This medication may also interact with the following: Medications that lower your chance of fighting infection Steroid medications, such as prednisone or cortisone This list may not describe all possible interactions. Give your health care provider a list of all the medicines, herbs, non-prescription drugs, or dietary supplements you  use. Also tell them if you smoke, drink alcohol, or use illegal drugs. Some items may interact with your medicine. What should I watch for while using this medication? Your condition will be monitored carefully while you are receiving this medication. You may need blood work while  taking this medication. This medication may increase your risk of getting an infection. Call your care team for advice if you get a fever, chills, sore throat, or other symptoms of a cold or flu. Do not treat yourself. Try to avoid being around people who are sick. You should make sure you get enough calcium and vitamin D while you are taking this medication, unless your care team tells you not to. Discuss the foods you eat and the vitamins you take with your care team. Some people who take this medication have severe bone, joint, or muscle pain. This medication may also increase your risk for jaw problems or a broken thigh bone. Tell your care team right away if you have severe pain in your jaw, bones, joints, or muscles. Tell your care team if you have any pain that does not go away or that gets worse. Talk to your care team if you may be pregnant. Serious birth defects can occur if you take this medication during pregnancy and for 5 months after the last dose. You will need a negative pregnancy test before starting this medication. Contraception is recommended while taking this medication and for 5 months after the last dose. Your care team can help you find the option that works for you. What side effects may I notice from receiving this medication? Side effects that you should report to your care team as soon as possible: Allergic reactions--skin rash, itching, hives, swelling of the face, lips, tongue, or throat Bone, joint, or muscle pain Low calcium level--muscle pain or cramps, confusion, tingling, or numbness in the hands or feet Osteonecrosis of the jaw--pain, swelling, or redness in the mouth, numbness of the jaw, poor healing after dental work, unusual discharge from the mouth, visible bones in the mouth Side effects that usually do not require medical attention (report to your care team if they continue or are bothersome): Cough Diarrhea Fatigue Headache Nausea This list may not  describe all possible side effects. Call your doctor for medical advice about side effects. You may report side effects to FDA at 1-800-FDA-1088. Where should I keep my medication? This medication is given in a hospital or clinic. It will not be stored at home. NOTE: This sheet is a summary. It may not cover all possible information. If you have questions about this medicine, talk to your doctor, pharmacist, or health care provider.  2024 Elsevier/Gold Standard (2021-09-29 00:00:00) Bortezomib Injection What is this medication? BORTEZOMIB (bor TEZ oh mib) treats lymphoma. It may also be used to treat multiple myeloma, a type of bone marrow cancer. It works by blocking a protein that causes cancer cells to grow and multiply. This helps to slow or stop the spread of cancer cells. This medicine may be used for other purposes; ask your health care provider or pharmacist if you have questions. COMMON BRAND NAME(S): Velcade What should I tell my care team before I take this medication? They need to know if you have any of these conditions: Dehydration Diabetes Heart disease Liver disease Tingling of the fingers or toes or other nerve disorder An unusual or allergic reaction to bortezomib, other medications, foods, dyes, or preservatives If you or your partner are pregnant or  trying to get pregnant Breastfeeding How should I use this medication? This medication is injected into a vein or under the skin. It is given by your care team in a hospital or clinic setting. Talk to your care team about the use of this medication in children. Special care may be needed. Overdosage: If you think you have taken too much of this medicine contact a poison control center or emergency room at once. NOTE: This medicine is only for you. Do not share this medicine with others. What if I miss a dose? Keep appointments for follow-up doses. It is important not to miss your dose. Call your care team if you are unable  to keep an appointment. What may interact with this medication? Ketoconazole Rifampin This list may not describe all possible interactions. Give your health care provider a list of all the medicines, herbs, non-prescription drugs, or dietary supplements you use. Also tell them if you smoke, drink alcohol, or use illegal drugs. Some items may interact with your medicine. What should I watch for while using this medication? Your condition will be monitored carefully while you are receiving this medication. You may need blood work while taking this medication. This medication may affect your coordination, reaction time, or judgment. Do not drive or operate machinery until you know how this medication affects you. Sit up or stand slowly to reduce the risk of dizzy or fainting spells. Drinking alcohol with this medication can increase the risk of these side effects. This medication may increase your risk of getting an infection. Call your care team for advice if you get a fever, chills, sore throat, or other symptoms of a cold or flu. Do not treat yourself. Try to avoid being around people who are sick. Check with your care team if you have severe diarrhea, nausea, and vomiting, or if you sweat a lot. The loss of too much body fluid may make it dangerous for you to take this medication. Talk to your care team if you may be pregnant. Serious birth defects can occur if you take this medication during pregnancy and for 7 months after the last dose. You will need a negative pregnancy test before starting this medication. Contraception is recommended while taking this medication and for 7 months after the last dose. Your care team can help you find the option that works for you. If your partner can get pregnant, use a condom during sex while taking this medication and for 4 months after the last dose. Do not breastfeed while taking this medication and for 2 months after the last dose. This medication may cause  infertility. Talk to your care team if you are concerned about your fertility. What side effects may I notice from receiving this medication? Side effects that you should report to your care team as soon as possible: Allergic reactions--skin rash, itching, hives, swelling of the face, lips, tongue, or throat Bleeding--bloody or black, tar-like stools, vomiting blood or brown material that looks like coffee grounds, red or dark brown urine, small red or purple spots on skin, unusual bruising or bleeding Bleeding in the brain--severe headache, stiff neck, confusion, dizziness, change in vision, numbness or weakness of the face, arm, or leg, trouble speaking, trouble walking, vomiting Bowel blockage--stomach cramping, unable to have a bowel movement or pass gas, loss of appetite, vomiting Heart failure--shortness of breath, swelling of the ankles, feet, or hands, sudden weight gain, unusual weakness or fatigue Infection--fever, chills, cough, sore throat, wounds that don't heal, pain  or trouble when passing urine, general feeling of discomfort or being unwell Liver injury--right upper belly pain, loss of appetite, nausea, light-colored stool, dark yellow or brown urine, yellowing skin or eyes, unusual weakness or fatigue Low blood pressure--dizziness, feeling faint or lightheaded, blurry vision Lung injury--shortness of breath or trouble breathing, cough, spitting up blood, chest pain, fever Pain, tingling, or numbness in the hands or feet Severe or prolonged diarrhea Stomach pain, bloody diarrhea, pale skin, unusual weakness or fatigue, decrease in the amount of urine, which may be signs of hemolytic uremic syndrome Sudden and severe headache, confusion, change in vision, seizures, which may be signs of posterior reversible encephalopathy syndrome (PRES) TTP--purple spots on the skin or inside the mouth, pale skin, yellowing skin or eyes, unusual weakness or fatigue, fever, fast or irregular heartbeat,  confusion, change in vision, trouble speaking, trouble walking Tumor lysis syndrome (TLS)--nausea, vomiting, diarrhea, decrease in the amount of urine, dark urine, unusual weakness or fatigue, confusion, muscle pain or cramps, fast or irregular heartbeat, joint pain Side effects that usually do not require medical attention (report to your care team if they continue or are bothersome): Constipation Diarrhea Fatigue Loss of appetite Nausea This list may not describe all possible side effects. Call your doctor for medical advice about side effects. You may report side effects to FDA at 1-800-FDA-1088. Where should I keep my medication? This medication is given in a hospital or clinic. It will not be stored at home. NOTE: This sheet is a summary. It may not cover all possible information. If you have questions about this medicine, talk to your doctor, pharmacist, or health care provider.  2024 Elsevier/Gold Standard (2021-10-12 00:00:00)       To help prevent nausea and vomiting after your treatment, we encourage you to take your nausea medication as directed.  BELOW ARE SYMPTOMS THAT SHOULD BE REPORTED IMMEDIATELY: *FEVER GREATER THAN 100.4 F (38 C) OR HIGHER *CHILLS OR SWEATING *NAUSEA AND VOMITING THAT IS NOT CONTROLLED WITH YOUR NAUSEA MEDICATION *UNUSUAL SHORTNESS OF BREATH *UNUSUAL BRUISING OR BLEEDING *URINARY PROBLEMS (pain or burning when urinating, or frequent urination) *BOWEL PROBLEMS (unusual diarrhea, constipation, pain near the anus) TENDERNESS IN MOUTH AND THROAT WITH OR WITHOUT PRESENCE OF ULCERS (sore throat, sores in mouth, or a toothache) UNUSUAL RASH, SWELLING OR PAIN  UNUSUAL VAGINAL DISCHARGE OR ITCHING   Items with * indicate a potential emergency and should be followed up as soon as possible or go to the Emergency Department if any problems should occur.  Please show the CHEMOTHERAPY ALERT CARD or IMMUNOTHERAPY ALERT CARD at check-in to the Emergency Department  and triage nurse.  Should you have questions after your visit or need to cancel or reschedule your appointment, please contact [facility] [phone]  and follow the prompts.  Office hours are 8:00 a.m. to 4:30 p.m. Monday - Friday. Please note that voicemails left after 4:00 p.m. may not be returned until the following business day.  We are closed weekends and major holidays. You have access to a nurse at all times for urgent questions. Please call the main number to the clinic [phone] and follow the prompts.  For any non-urgent questions, you may also contact your provider using MyChart. We now offer e-Visits for anyone 3 and older to request care online for non-urgent symptoms. For details visit [email or web address].   Also download the MyChart app! Go to the app store, search "MyChart", open the app, select [place], and log in with  your MyChart username and password.

## 2023-01-24 ENCOUNTER — Telehealth: Payer: Self-pay | Admitting: Family Medicine

## 2023-01-24 NOTE — Telephone Encounter (Signed)
Courtney Rogers with [facility] called in on patient behalf   States that she spoke with this morning states that patient dis not sound good, sounded a bit out of it.  Courtney Rogers is having concerns on patients  lenalidomide (REVLIMID) 10 MG capsule

## 2023-01-25 NOTE — Telephone Encounter (Signed)
Called pt was hard to hear, hung up first time, tried her again pt hung up again.

## 2023-01-26 ENCOUNTER — Other Ambulatory Visit: Payer: Self-pay | Admitting: Hematology

## 2023-01-26 ENCOUNTER — Other Ambulatory Visit: Payer: Self-pay | Admitting: Family Medicine

## 2023-01-26 DIAGNOSIS — J301 Allergic rhinitis due to pollen: Secondary | ICD-10-CM

## 2023-01-26 DIAGNOSIS — I10 Essential (primary) hypertension: Secondary | ICD-10-CM

## 2023-01-28 ENCOUNTER — Other Ambulatory Visit: Payer: Self-pay | Admitting: Gastroenterology

## 2023-01-30 ENCOUNTER — Ambulatory Visit (INDEPENDENT_AMBULATORY_CARE_PROVIDER_SITE_OTHER): Payer: Medicare Other | Admitting: Family Medicine

## 2023-01-30 VITALS — BP 111/70 | HR 73 | Ht 64.0 in | Wt 153.0 lb

## 2023-01-30 DIAGNOSIS — W19XXXD Unspecified fall, subsequent encounter: Secondary | ICD-10-CM

## 2023-01-30 DIAGNOSIS — F411 Generalized anxiety disorder: Secondary | ICD-10-CM | POA: Diagnosis not present

## 2023-01-30 DIAGNOSIS — Z79899 Other long term (current) drug therapy: Secondary | ICD-10-CM | POA: Insufficient documentation

## 2023-01-30 DIAGNOSIS — R197 Diarrhea, unspecified: Secondary | ICD-10-CM | POA: Diagnosis not present

## 2023-01-30 NOTE — Patient Instructions (Addendum)
I appreciate the opportunity to provide care to you today!    Follow up:  02/24/2023   -To manage diarrhea, I recommend starting with over-the-counter Pepto-Bismol to alleviate your symptoms. It is also advisable to follow a BRAT diet (bananas, rice, applesauce, and toast) and ensure you stay well-hydrated by consuming at least 64 ounces of fluids daily.    If you experience any of the following red flag symptoms while dealing with diarrhea, it is important to seek medical attention promptly:  Persistent Fever: A high fever that does not subside with over-the-counter medication. Severe Abdominal Pain: Intense or worsening abdominal pain that does not improve. Blood in Stool: Presence of blood or black, tarry stools, which may indicate internal bleeding. Signs of Dehydration: Symptoms such as excessive thirst, dry mouth, reduced urination, or dark-colored urine. Prolonged Diarrhea: Diarrhea lasting more than a few days despite treatment. Persistent Vomiting: Ongoing vomiting that prevents you from keeping fluids down. Weight Loss: Significant or rapid weight loss associated with diarrhea. These symptoms may indicate a more serious underlying condition that requires professional medical evaluation and intervention.   -Additionally, please contact the facility where your husband will be admitted and request that they fax the [facility] forms to me for completion.    Referral: Pharmacy for medication management   Please continue to a heart-healthy diet and increase your physical activities. Try to exercise for at least five days a week.    It was a pleasure to see you and I look forward to continuing to work together on your health and well-being. Please do not hesitate to call the office if you need care or have questions about your care.  In case of emergency, please visit the Emergency Department for urgent care, or contact our clinic at [phone] to schedule an appointment. We're  here to help you!   Have a wonderful day and week. With Gratitude, Gilmore Laroche MSN, FNP-BC

## 2023-01-30 NOTE — Assessment & Plan Note (Addendum)
The patient reports experiencing diarrhea, gas, and acid reflux for the past 2 days. The diarrhea is noninfectious; she denies any symptoms of mucus, blood, fever, or severe abdominal pain. She mentioned having had one loose stool today.  The patient was encouraged to follow the BRAT diet (bananas, rice, applesauce, and toast) and to seek follow-up care if she develops persistent fever, severe abdominal pain, blood in her stools, or signs of dehydration, such as excessive thirst, dry mouth, reduced urination, dark-colored urine, prolonged diarrhea lasting beyond 7 days, persistent vomiting, or significant rapid weight loss associated with diarrhea.  Additionally, she was advised to take over-the-counter Pepto-Bismol and to follow up if her symptoms worsen.

## 2023-01-30 NOTE — Assessment & Plan Note (Signed)
Referral to pharmacy for medication management is placed

## 2023-01-30 NOTE — Assessment & Plan Note (Signed)
The patient denies any recent falls. Non-pharmacological interventions to prevent falls were reviewed, including keeping pathways clear of clutter, ensuring adequate lighting, and using her walker for ambulation. The patient verbalized understanding of these recommendations.

## 2023-01-30 NOTE — Assessment & Plan Note (Signed)
Encouraged to continue taking Zoloft 25 mg daily Encouraged the patient to fax the [place] forms to be completed and to schedule an appointment to assess her husband's TB levels Reviewed nonpharmacological management of anxiety

## 2023-01-30 NOTE — Progress Notes (Signed)
Established Patient Office Visit  Subjective:  Patient ID: Courtney Rogers, female    DOB: 1950/10/29  Age: 72 y.o. MRN: [id number]  CC:  Chief Complaint  Patient presents with   Fall    F/u   GI Problem    Trouble w/ diarrhea, gas, acid reflux x 2 days    Anxiety    Husband has dementia, which is causing anxious reactions. She has spoken to someone about committing him to a facility because it is hard for her to care for him alone.    Medication Problem    Pt. Is not sure of all the medications she is taking .    HPI Courtney Rogers is a 72 y.o. female  presents for follow-up for fall.  Fall: The patient was observed using her walker and reports no recent falls.  Anxiety: The patient takes Zoloft 25 mg daily, which she finds effective. However, her anxiety levels are often elevated due to her role as the primary caregiver for her husband, who has dementia. She is currently seeking to place him in a care facility to reduce her caregiving burden. The patient denies experiencing suicidal thoughts or ideation.  Medication Management: The patient is uncertain about all the medications she is taking and requires assistance with medication management.    Past Medical History:  Diagnosis Date   Acute myocardial infarction [facility]) 2009   CAD/no stent medically managed   Anxiety disorder    CAD (coronary artery disease)    Cancer (HCC)    multiple myeloma   Hyperlipidemia    Hypertension    IBS (irritable bowel syndrome)    Non-ulcer dyspepsia 04/30/2009   Qualifier: Diagnosis of  By: Darrick Penna MD, Sandi L    Overactive bladder    PE (pulmonary embolism) 10/2012   left lung   Presence of permanent cardiac pacemaker    Sleep apnea     Past Surgical History:  Procedure Laterality Date   ABDOMINAL HYSTERECTOMY     BSO secondary to cyst     CARDIAC CATHETERIZATION     COLONOSCOPY  12/2009   Dr. Aleene Davidson, propofol, normal. Next TCS 12/2019   COLONOSCOPY N/A 05/22/2017    Examined portion ileum was normal, significant looping of colon, external hemorrhoids and rectal bleeding due to internal hemorrhoids, mild diverticulosis procedure: COLONOSCOPY;  Surgeon: West Bali, MD;  [facility];  Service: Endoscopy;  Laterality: N/A;  10:30am   ESOPHAGOGASTRODUODENOSCOPY  05/11/2009   schatzki ring/small hiatal hernia/path:gastritis   ESOPHAGOGASTRODUODENOSCOPY N/A 05/22/2017   Multiple gastric polyps with biopsy benign fundic gland polyp, small bowel biopsy negative for celiac, gastric biopsy with mild gastritis but no H. pylori procedure: ESOPHAGOGASTRODUODENOSCOPY (EGD);  Surgeon: West Bali, MD;  Location: [facility];  Service: Endoscopy;  Laterality: N/A;   ESOPHAGOGASTRODUODENOSCOPY (EGD) WITH ESOPHAGEAL DILATION N/A 04/01/2013   WUJ:[id number] at the gastroesophageal juction/multiple small polyps/mild gastritis   GIVENS CAPSULE STUDY N/A 06/07/2017   Frequent gastric erosions, normal small bowel mucosa.  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    Family History  Problem Relation Age of Onset   Colon polyps Neg Hx    Colon cancer Neg Hx     Social History   Socioeconomic History  Marital status: Married    Spouse name: [name]   Number of children: 3   Years of education: 16   Highest education level: Not on file  Occupational History   Not on file  Tobacco Use   Smoking status: Never    Passive exposure: Never   Smokeless tobacco: Never   Tobacco comments:    Never smoked   Vaping Use   Vaping status: Never Used  Substance and Sexual Activity   Alcohol use: Not Currently    Comment: occ wine   Drug use: No   Sexual activity: Not Currently  Other Topics Concern   Not on file  Social History Narrative       Lives with husband-51 years 950 in Aug 2021   Daughter is close by, 2 sons live further away   One IN [place],ONE IN [place], ONE BESIDE HER.        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   Enjoys: reading, young adult      Diet: eats all food groups    Caffeine: coffee 1, tea daily soda-1 daily   Water: 1-2 cups      Wears seat belt    Does not use phone while driving   Smoke detectors at home    Licensed conveyancer -locked up      Left handed   One story home   Drinks caffeine   Social Determinants of Health   Financial Resource Strain: Low Risk  (12/21/2022)   Overall Financial Resource Strain (CARDIA)    Difficulty of Paying Living Expenses: Not hard at all  Food Insecurity: No Food Insecurity (12/21/2022)   Hunger Vital Sign    Worried About Running Out of Food in the Last Year: Never true    Ran Out of Food in the Last Year: Never true  Transportation Needs: No Transportation Needs (12/21/2022)   PRAPARE - Administrator, Civil Service (Medical): No    Lack of Transportation (Non-Medical): No  Physical Activity: Inactive (12/21/2022)   Exercise Vital Sign    Days of Exercise per Week: 0 days    Minutes of Exercise per Session: 0 min  Stress: Stress Concern Present (12/21/2022)   [employer] of Occupational Health - Occupational Stress Questionnaire    Feeling of Stress : To some extent  Social Connections: Moderately Integrated (12/21/2022)   Social Connection and Isolation Panel [NHANES]    Frequency of Communication with Friends and Family: More than three times a week    Frequency of Social Gatherings with Friends and Family: More than three times a week    Attends Religious Services: More than 4 times per year    Active Member of [employer] or Organizations: No    Attends Banker Meetings: Never    Marital Status: Married  Catering manager Violence: Not At Risk (12/21/2022)   Humiliation, Afraid, Rape, and Kick  questionnaire    Fear of Current or Ex-Partner: No    Emotionally Abused: No    Physically Abused: No    Sexually Abused: No    Outpatient Medications Prior to Visit  Medication Sig Dispense Refill   acetaminophen (TYLENOL) 500 MG tablet Take 1,000 mg by mouth 2 (two) times daily as needed for moderate pain or headache.     acyclovir (ZOVIRAX) 400 MG tablet TAKE ONE TABLET BY MOUTH TWICE DAILY 60 tablet 6   albuterol (VENTOLIN HFA) 108 (90 Base) MCG/ACT inhaler USE 2 PUFFS  ONE TO TWO TIMES A DAY. 18 g 5   amLODipine (NORVASC) 10 MG tablet TAKE ONE TABLET BY MOUTH ONCE DAILY 90 tablet 0   ascorbic acid (VITAMIN C) 500 MG tablet Take 500 mg by mouth daily.     aspirin EC 81 MG tablet Take 81 mg by mouth at bedtime.     B Complex Vitamins (VITAMIN B COMPLEX) TABS Take 1 tablet by mouth daily.     B Complex-Folic Acid (B COMPLEX VITAMINS, W/ FA,) CAPS Take 1 capsule by mouth daily.     bortezomib SQ (VELCADE) 3.5 MG injection Inject into the skin once.     Calcium Carbonate-Vitamin D (CALCIUM 600+D PO) Take 1 tablet by mouth 2 (two) times daily.     chlorthalidone (HYGROTON) 25 MG tablet Take 25 mg by mouth daily.     cholecalciferol (VITAMIN D3) 10 MCG (400 UNIT) TABS tablet Take 400 Units by mouth daily.     cholecalciferol 25 MCG (1000 UT) tablet Take by mouth.     cyanocobalamin (VITAMIN B12) 1000 MCG tablet Take by mouth.     cyclobenzaprine (FLEXERIL) 10 MG tablet TAKE ONE-HALF TABLET BY MOUTH TWICE DAILY AS NEEDED FOR MUSCLE SPASMS 20 tablet 0   Daratumumab-Hyaluronidase-fihj (DARZALEX FASPRO [place]) Inject into the skin.     diclofenac Sodium (VOLTAREN) 1 % GEL Apply 4 g topically 4 (four) times daily. 50 g 0   fluticasone (FLONASE) 50 MCG/ACT nasal spray USE 2 SPRAYS IN EACH NOSTRIL ONCE DAILY 16 g 0   fluticasone-salmeterol (ADVAIR) 250-50 MCG/ACT AEPB Inhale into the lungs.     folic acid (FOLVITE) 1 MG tablet TAKE 1 TABLET BY MOUTH ONCE DAILY. 30 tablet 11   gabapentin (NEURONTIN)  100 MG capsule Take 100 mg by mouth 3 (three) times daily.     HYDROcodone-acetaminophen (NORCO/VICODIN) 5-325 MG tablet TAKE ONE TABLET BY MOUTH EVERY 8 HOURS AS NEEDED FOR MODERATE PAIN 60 tablet 0   Hyoscyamine Sulfate SL 0.125 MG SUBL      lenalidomide (REVLIMID) 10 MG capsule Take 1 capsule (10 mg total) by mouth daily. 21 days on, 7 days off every 28 days 21 capsule 0   levocetirizine (XYZAL) 5 MG tablet TAKE ONE TABLET BY MOUTH IN THE EVENING 30 tablet 1   lidocaine (HM LIDOCAINE PATCH) 4 % Place 1 patch onto the skin daily. 14 patch 0   lidocaine (XYLOCAINE) 2 % solution Take 2 teaspoons before meals and at bedtime as needed. May repeat every 4 hours for a maximum dose of 8 per day. 300 mL 0   Lidocaine HCl (XOLIDO) 2 % CREA Apply 1 application topically daily as needed (arthritisis).     loperamide (IMODIUM) 2 MG capsule Take 4 mg by mouth as needed. Take 2 tablets after the first loose stool and then 1 tablet after each loose stool.  Do not exceed 8 capsules within 24 hours.     losartan (COZAAR) 100 MG tablet TAKE ONE TABLET BY MOUTH ONCE DAILY 30 tablet 3   magnesium oxide (MAG-OX) 400 (240 Mg) MG tablet Take 1 tablet (400 mg total) by mouth 2 (two) times daily. 60 tablet 4   metoprolol tartrate (LOPRESSOR) 50 MG tablet Take 1 tablet (50 mg total) by mouth 2 (two) times daily. (Patient taking differently: Take 25 mg by mouth 2 (two) times daily.) 180 tablet 1   montelukast (SINGULAIR) 10 MG tablet Take 10 mg by mouth daily.     montelukast (SINGULAIR) 10 MG tablet  Take 1 tablet (10 mg total) by mouth at bedtime. 30 tablet 3   MYRBETRIQ 50 MG TB24 tablet TAKE ONE TABLET BY MOUTH ONCE DAILY 30 tablet 1   nitroGLYCERIN (NITROSTAT) 0.4 MG SL tablet Place 0.4 mg under the tongue every 5 (five) minutes as needed for chest pain.     pantoprazole (PROTONIX) 40 MG tablet Take 40 mg by mouth daily.     potassium chloride (KLOR-CON M) 10 MEQ tablet Take 1 tablet (10 mEq total) by mouth daily for 21  days. 21 tablet 0   potassium chloride SA (KLOR-CON M) 20 MEQ tablet Take 1 tablet (20 mEq total) by mouth 2 (two) times daily.     primidone (MYSOLINE) 50 MG tablet Take by mouth.     Probiotic Product (GNP PROBIOTIC COLON SUPPORT) CAPS Take 1 capsule by mouth daily.     Saccharomyces boulardii (PROBIOTIC) 250 MG CAPS Take 1 capsule by mouth daily.     sertraline (ZOLOFT) 25 MG tablet Take 1 tablet (25 mg total) by mouth daily. 30 tablet 3   simvastatin (ZOCOR) 40 MG tablet TAKE ONE TABLET BY MOUTH AT BEDTIME 90 tablet 1   tadalafil, PAH, (ALYQ) 20 MG tablet Take 2 tablets every day by oral route.     topiramate (TOPAMAX) 25 MG tablet Take by mouth.     warfarin (COUMADIN) 3 MG tablet Take 3 mg by mouth daily.     No facility-administered medications prior to visit.    Allergies  Allergen Reactions   Amoxicillin-Pot Clavulanate Other (See Comments)   Clarithromycin Other (See Comments)    Stomach problems   Erythromycin    Lisinopril Swelling   Amoxicillin Diarrhea    ROS Review of Systems  Constitutional:  Negative for chills and fever.  Eyes:  Negative for visual disturbance.  Respiratory:  Negative for chest tightness and shortness of breath.   Gastrointestinal:  Positive for diarrhea.  Neurological:  Negative for dizziness and headaches.      Objective:    Physical Exam HENT:     Head: Normocephalic.     Mouth/Throat:     Mouth: Mucous membranes are moist.  Cardiovascular:     Rate and Rhythm: Normal rate.     Heart sounds: Normal heart sounds.  Pulmonary:     Effort: Pulmonary effort is normal.     Breath sounds: Normal breath sounds.  Neurological:     Mental Status: She is alert.     BP 111/70 (BP Location: Right Arm, Patient Position: Sitting, Cuff Size: Normal)   Pulse 73   Ht 5\' 4"  (1.626 m)   Wt 153 lb 0.6 oz (69.4 kg)   SpO2 97%   BMI 26.27 kg/m  Wt Readings from Last 3 Encounters:  01/30/23 153 lb 0.6 oz (69.4 kg)  01/18/23 156 lb 3.2 oz  (70.9 kg)  01/15/23 160 lb 15 oz (73 kg)    Lab Results  Component Value Date   TSH 0.860 01/04/2023   Lab Results  Component Value Date   WBC 4.4 01/18/2023   HGB 12.1 01/18/2023   HCT 36.3 01/18/2023   MCV 103.7 (H) 01/18/2023   PLT 101 (L) 01/18/2023   Lab Results  Component Value Date   NA 136 01/18/2023   K 2.8 (L) 01/18/2023   CO2 27 01/18/2023   GLUCOSE 91 01/18/2023   BUN 19 01/18/2023   CREATININE 1.00 01/18/2023   BILITOT 0.8 01/18/2023   ALKPHOS 48 01/18/2023   AST 22  01/18/2023   ALT 19 01/18/2023   PROT 7.1 01/18/2023   ALBUMIN 4.3 01/18/2023   CALCIUM 8.6 (L) 01/18/2023   ANIONGAP 10 01/18/2023   EGFR 59 (L) 01/12/2023   Lab Results  Component Value Date   CHOL 146 06/27/2022   Lab Results  Component Value Date   HDL 65 06/27/2022   Lab Results  Component Value Date   LDLCALC 62 06/27/2022   Lab Results  Component Value Date   TRIG 102 06/27/2022   Lab Results  Component Value Date   CHOLHDL 2.2 06/27/2022   Lab Results  Component Value Date   HGBA1C 5.2 06/27/2022      Assessment & Plan:  Polypharmacy Assessment & Plan: Referral to pharmacy for medication management is placed  Orders: -     AMB Referral to Pharmacy Medication Management  Anxiety state Assessment & Plan: Encouraged to continue taking Zoloft 25 mg daily Encouraged the patient to fax the [place] 2 forms to be completed and to schedule an appointment to assess her husband's TB levels Reviewed nonpharmacological management of anxiety   Fall, subsequent encounter Assessment & Plan: The patient denies any recent falls. Non-pharmacological interventions to prevent falls were reviewed, including keeping pathways clear of clutter, ensuring adequate lighting, and using her walker for ambulation. The patient verbalized understanding of these recommendations.   Diarrhea, unspecified type Assessment & Plan: The patient reports experiencing diarrhea, gas, and acid reflux  for the past 2 days. The diarrhea is noninfectious; she denies any symptoms of mucus, blood, fever, or severe abdominal pain. She mentioned having had one loose stool today.  The patient was encouraged to follow the BRAT diet (bananas, rice, applesauce, and toast) and to seek follow-up care if she develops persistent fever, severe abdominal pain, blood in her stools, or signs of dehydration, such as excessive thirst, dry mouth, reduced urination, dark-colored urine, prolonged diarrhea lasting beyond 7 days, persistent vomiting, or significant rapid weight loss associated with diarrhea.  Additionally, she was advised to take over-the-counter Pepto-Bismol and to follow up if her symptoms worsen.   Note: This chart has been completed using Engineer, civil (consulting) software, and while attempts have been made to ensure accuracy, certain words and phrases may not be transcribed as intended.    Follow-up: Return in about 25 days (around 02/24/2023).   Gilmore Laroche, FNP

## 2023-01-31 ENCOUNTER — Telehealth: Payer: Self-pay

## 2023-01-31 ENCOUNTER — Other Ambulatory Visit: Payer: Medicare Other

## 2023-01-31 NOTE — Progress Notes (Signed)
Care Guide Note  01/31/2023 Name: Courtney Rogers MRN: [id number] DOB: 01/25/1951  Referred by: Gilmore Laroche, FNP Reason for referral : No chief complaint on file.   Courtney Rogers is a 72 y.o. year old female who is a primary care patient of Gilmore Laroche, FNP. Courtney Rogers was referred to the pharmacist for assistance related to HTN, HLD, and Depression.    An unsuccessful telephone outreach was attempted today to contact the patient who was referred to the pharmacy team for assistance with medication management. Additional attempts will be made to contact the patient.   Courtney Rogers, RMA Care Guide [facility]  [place], [place] [address] Direct Dial: [phone] [email or web address]

## 2023-01-31 NOTE — Progress Notes (Signed)
01/31/2023 Name: Courtney Rogers MRN: [id number] DOB: 29-Nov-1950  Chief Complaint  Patient presents with   Medication Management    Courtney Rogers is a 72 y.o. year old female who presented for a telephone visit.   They were referred to the pharmacist by their PCP for assistance in managing complex medication management.   PMH includes HTN, ASCVD (MI), IgA myeloma,  allergic rhinitis, asthma, IBS, GERD, fatty liver, coagulation disorder (history of PE), anxiety, depression, pain (back, knee), dyspepsia, tremors, cardiac pacemaker, urinary incontinence, hypokalemia, polypharmacy  Subjective:  Pt denies questions or concerns about medications today  Recently visited ED on 01/15/23 reporting memory loss/confusion. She was treated with glucose for hypoglycemia, although she is not on any antihyperglycemic medications. Per recent PCP note, pt is having increased anxiety as she is unable to adequately care for her spouse who has dementia.   Care Team: Primary Care Provider: Gilmore Laroche, FNP ; Next Scheduled Visit: 02/24/23  Medication Access/Adherence  Current Pharmacy:  [facility] - [place], [place] - [address] [place] [address] Phone: [phone] Fax: [phone]  [facility] [address] [place] [address] Phone: [phone] Fax: [phone]  [facility] - [place], [place] - [address] [place] [address] Phone: [phone] Fax: [phone]   Patient reports affordability concerns with their medications: No  Patient reports access/transportation concerns to their pharmacy: No  Patient reports adherence concerns with their medications:  No  Pt is unsure of her medications, but she does get her medications in pill packs from [facility]   Medication Management:  Current adherence strategy: pill packs from [facility]  pharmacy  Patient reports Good adherence to medications  Patient reports the following barriers to adherence: Pt does not state any issues obtaining or taking or medications  Recent fill dates:  Documented which medications were recently filled by [facility] in packs on 01/27/23 in med list below (via dispense report data). [facility] was closed at the time of this appt - so I was not able to reach out and verbally confirm packaged medications  Patient denies specific questions related to her medications  Medicines in Pill Pack - Acyclovir 400 mg BID - Amlodipine 10 mg daily - Ascorbic Acid 500 mg daily - Aspirin 81 mg daily - buspirone 5 mg daily - calcium carbonate-cholecalciferol 600-800 mg BID - cholecalciferol 400 units daily - chlorthalidone 25 mg daily - folic acid 1 mg daily - gabapentin 100 mg TID - levocetirizine 5 mg daily - losartan 100 mg daily - magnesium oxide 400 mg BID - metoprolol tartrate 50 mg BID - mirabegron 50 mg daily - montelukast 10 mg daily - pantoprazole 40 mg daily - potassium chloride ER 20 mg BID - primidone 50 mg BID - sertraline 25 mg daily - simvastatin 40 mg daily - topiramate 25 mg BID  - warfarin 3 mg and 6 mg tablets (may alternate days- unsure of exact dosing regimen)  Oncology medications - lenalidomide - Velcade injection - Darzalex faspro injection  Denies increased sleepiness, fatigue, or confusion when she takes hydrocodone-APAP PRN for pain (has not taken in the past week).    Objective:  Lab Results  Component Value Date   HGBA1C 5.2 06/27/2022    Lab Results  Component Value Date   CREATININE 1.00 01/18/2023   BUN 19 01/18/2023   NA 136 01/18/2023  K 2.8 (L) 01/18/2023   CL 99 01/18/2023   CO2 27 01/18/2023    Lab Results  Component Value Date   CHOL 146 06/27/2022   HDL 65 06/27/2022   LDLCALC 62 06/27/2022   TRIG 102 06/27/2022   CHOLHDL 2.2 06/27/2022    Medications Reviewed  Today     Reviewed by Particia Lather, RPH (Pharmacist) on 01/31/23 at 1928  Med List Status: <None>   Medication Order Taking? Sig Documenting Provider Last Dose Status Informant  acetaminophen (TYLENOL) 500 MG tablet [id number] No Take 1,000 mg by mouth 2 (two) times daily as needed for moderate pain or headache.  Patient not taking: Reported on 01/31/2023   [provider] Not Taking Active Self  acyclovir (ZOVIRAX) 400 MG tablet [id number] Yes TAKE ONE TABLET BY MOUTH TWICE DAILY Doreatha Massed, MD Taking Active            Med Note Particia Lather   Tue Jan 31, 2023  6:53 PM) In pill pack  amLODipine (NORVASC) 10 MG tablet [id number] Yes TAKE ONE TABLET BY MOUTH ONCE DAILY Doreatha Massed, MD Taking Active            Med Note Particia Lather   Tue Jan 31, 2023  6:54 PM) In pill pack  ascorbic acid (VITAMIN C) 500 MG tablet [id number] Yes Take 500 mg by mouth daily. [provider] Taking Active            Med Note Particia Lather   Tue Jan 31, 2023  6:54 PM) In pill pack  aspirin EC 81 MG tablet [id number] Yes Take 81 mg by mouth at bedtime. [provider] Taking Active Self           Med Note Particia Lather   Tue Jan 31, 2023  6:54 PM) In pill pack  B Complex Vitamins (VITAMIN B COMPLEX) TABS [id number]  Take 1 tablet by mouth daily. [provider]  Active Self  B Complex-Folic Acid (B COMPLEX VITAMINS, W/ FA,) CAPS [id number]  Take 1 capsule by mouth daily. [provider]  Active   bortezomib SQ (VELCADE) 3.5 MG injection [id number] Yes Inject into the skin once. [provider] Taking Active Self  busPIRone (BUSPAR) 5 MG tablet [id number] Yes Take 5 mg by mouth daily. Elam Dutch, MD Taking Active            Med Note Particia Lather   Tue Jan 31, 2023  6:56 PM) In pill pack  Calcium Carbonate-Vitamin D (CALCIUM 600+D PO) [id number] Yes Take 1 tablet by mouth 2 (two) times daily. [provider] Taking Active Self           Med  Note Particia Lather   Tue Jan 31, 2023  6:57 PM) In pill pack  chlorthalidone (HYGROTON) 25 MG tablet [id number] Yes Take 25 mg by mouth daily. [provider] Taking Active            Med Note Particia Lather   Tue Jan 31, 2023  6:58 PM) In pill pack  cholecalciferol (VITAMIN D3) 10 MCG (400 UNIT) TABS tablet [id number] Yes Take 400 Units by mouth daily. [provider] Taking Active            Med Note Particia Lather   Tue Jan 31, 2023  6:58 PM) In pill pack  cyanocobalamin (VITAMIN B12) 1000 MCG tablet [id number] Yes Take by mouth. [provider] Taking Active  Med Note Particia Lather   Tue Jan 31, 2023  6:59 PM) In pill pack  cyclobenzaprine (FLEXERIL) 10 MG tablet [id number] No TAKE ONE-HALF TABLET BY MOUTH TWICE DAILY AS NEEDED FOR MUSCLE SPASMS Gilmore Laroche, FNP Unknown Active            Med Note Particia Lather   Tue Jan 31, 2023  7:00 PM) Does not look like it is in pill pack - pt unsure whether taking. 20ds dispensed in Aug 2024.  Daratumumab-Hyaluronidase-fihj (DARZALEX FASPRO [place]) [id number] Yes Inject into the skin. [provider] Taking Active Self  diclofenac Sodium (VOLTAREN) 1 % GEL [id number] Yes Apply 4 g topically 4 (four) times daily. Gilmore Laroche, FNP Taking Active            Med Note Particia Lather   Tue Jan 31, 2023  6:40 PM) PRN  fluticasone [facility]) 50 MCG/ACT nasal spray [id number] Yes USE 2 SPRAYS IN [facility] NOSTRIL ONCE DAILY Gilmore Laroche, FNP Taking Active   fluticasone-salmeterol (ADVAIR) 250-50 MCG/ACT AEPB [id number] No Inhale into the lungs.  Patient not taking: Reported on 01/31/2023   [provider] Not Taking Active   folic acid (FOLVITE) 1 MG tablet [id number] Yes TAKE 1 TABLET BY MOUTH ONCE DAILY. Doreatha Massed, MD Taking Active            Med Note Particia Lather   Tue Jan 31, 2023  7:01 PM) In pill pack  gabapentin (NEURONTIN) 100 MG capsule [id number] Yes Take 100 mg by mouth 3 (three) times daily.  [provider] Taking Active Self           Med Note Particia Lather   Tue Jan 31, 2023  7:02 PM) In pill pack  HYDROcodone-acetaminophen (NORCO/VICODIN) 5-325 MG tablet [id number] Yes TAKE ONE TABLET BY MOUTH EVERY 8 HOURS AS NEEDED FOR MODERATE PAIN Doreatha Massed, MD Taking Active            Med Note Particia Lather   Tue Jan 31, 2023  6:43 PM) Leanora Ivanoff every now and then - takes for knee pain. Has not taken in the past week.   Hyoscyamine Sulfate SL 0.125 MG SUBL [id number] No  [provider] Unknown Active   lenalidomide (REVLIMID) 10 MG capsule [id number] Yes Take 1 capsule (10 mg total) by mouth daily. 21 days on, 7 days off every 28 days Doreatha Massed, MD Taking Active   levocetirizine (XYZAL) 5 MG tablet [id number] Yes TAKE ONE TABLET BY MOUTH IN THE Ricki Miller, FNP Taking Active            Med Note Particia Lather   Tue Jan 31, 2023  7:04 PM) In pill pack  lidocaine (HM LIDOCAINE PATCH) 4 % [id number] Yes Place 1 patch onto the skin daily. Rondel Baton, MD Taking Active            Med Note Particia Lather   Tue Jan 31, 2023  6:45 PM) PRN  losartan (COZAAR) 100 MG tablet [id number] Yes TAKE ONE TABLET BY MOUTH ONCE DAILY Gilmore Laroche, FNP Taking Active            Med Note Particia Lather   Tue Jan 31, 2023  7:05 PM) In pill pack  magnesium oxide (MAG-OX) 400 (240 Mg) MG tablet [id number] Yes Take 1 tablet (400 mg total) by mouth 2 (two) times daily. Doreatha Massed, MD Taking Active  Med Note Particia Lather   Tue Jan 31, 2023  7:06 PM) In pill pack  metoprolol tartrate (LOPRESSOR) 50 MG tablet [id number] Yes Take 1 tablet (50 mg total) by mouth 2 (two) times daily. [name], FNP Taking Active            Med Note Particia Lather   Tue Jan 31, 2023  7:06 PM) In pill pack  montelukast (SINGULAIR) 10 MG tablet [id number] Yes Take 1 tablet (10 mg total) by mouth at bedtime. Gilmore Laroche, FNP Taking Active            Med Note  Particia Lather   Tue Jan 31, 2023  7:07 PM) In pill pack  MYRBETRIQ 50 MG TB24 tablet [id number] Yes TAKE ONE TABLET BY MOUTH ONCE DAILY Gilmore Laroche, FNP Taking Active            Med Note Particia Lather   Tue Jan 31, 2023  7:07 PM) In pill pack  nitroGLYCERIN (NITROSTAT) 0.4 MG SL tablet [id number] No Place 0.4 mg under the tongue every 5 (five) minutes as needed for chest pain.  Patient not taking: Reported on 01/31/2023   [provider] Not Taking Active Self  pantoprazole (PROTONIX) 40 MG tablet [id number] Yes Take 40 mg by mouth daily. [provider] Taking Active Self           Med Note Particia Lather   Tue Jan 31, 2023  7:08 PM) In pill pack  potassium chloride SA (KLOR-CON M) 20 MEQ tablet [id number] Yes Take 1 tablet (20 mEq total) by mouth 2 (two) times daily. Del Newman Nip, Tenna Child, FNP Taking Active            Med Note Particia Lather   Tue Jan 31, 2023  7:08 PM) In pill pack  primidone (MYSOLINE) 50 MG tablet [id number] Yes Take by mouth. [provider] Taking Active            Med Note Particia Lather   Tue Jan 31, 2023  7:08 PM) In pill pack  Probiotic Product (GNP PROBIOTIC COLON SUPPORT) CAPS [id number] No Take 1 capsule by mouth daily. [provider] Unknown Active Self  Saccharomyces boulardii (PROBIOTIC) 250 MG CAPS [id number] No Take 1 capsule by mouth daily. [provider] Unknown Active   sertraline (ZOLOFT) 25 MG tablet [id number] Yes Take 1 tablet (25 mg total) by mouth daily. Gilmore Laroche, FNP Taking Active            Med Note Particia Lather   Tue Jan 31, 2023  7:09 PM) In pill pack  simvastatin (ZOCOR) 40 MG tablet [id number] Yes TAKE ONE TABLET BY MOUTH AT BEDTIME Gilmore Laroche, FNP Taking Active            Med Note Particia Lather   Tue Jan 31, 2023  7:09 PM) In pill pack  tadalafil, PAH, (ALYQ) 20 MG tablet [id number] No Take 2 tablets every day by oral route. [provider] Unknown Active   topiramate (TOPAMAX)  25 MG tablet [id number] Yes Take 25 mg by mouth 2 (two) times daily. [provider] Taking Active            Med Note Particia Lather   Tue Jan 31, 2023  7:10 PM) In pill pack  warfarin (COUMADIN) 3 MG tablet [id number] Yes Take 3 mg by mouth daily. [provider] Taking Active Self  Med Note Particia Lather   Tue Jan 31, 2023  7:28 PM) In pill pack - pt was dispensed 14 tabs of 3 mg and 14 tabs of 6 mg             Assessment/Plan:   Medication Management: Pt with polypharmacy, with multiple prescribers for her medications, some of which are outside of [facility] EMR - including neurology and cardiology specialists. Pt recently received one month of pill packs from [facility] on 01/27/23 per dispense report (pharmacy was not open during appt time to verify medications/dispense date). Pt is a good candidate for pill packing as she reported she needs assistance with medication management.   If medication changes are indicated - PCP will need to coordinate with [facility] pharmacy to update next month's pill packs. Strategies for medication optimization and improvement in polypharmacy are as follows:  - Maximum dose of simvastatin is 20 mg daily when taking amlodipine 10 mg daily. Pt would benefit from transitioning to high intensity statin without drug interaction given hx of MI - consider switching from simvastatin 40 mg daily to atorvastatin 40 mg daily in subsequent pill pack.   - Pt blood pressure is well controlled on chlorthalidone 25 mg daily, losartan 100 mg daily, and amlodipine 10 mg daily, however pt continues to have hypokalemia. Given that most recent BP was well controlled below goal <130/80 mmHg - could consider discontinuing chlorthalidone. If BP becomes elevated and/or hypokalemia is still present - could consider addition of spironolactone 12.5 mg daily in subsequent pill pack.   -Pt is on multiple CNS depressing medications including  topiramate, primidone, gabapentin, and hydrocodone-APAP PRN - which could be contributing to patient reported altered mental status.   - Because pt is on warfarin - would suggest monitoring INR after any medication changes.    Follow Up Plan: PCP 02/24/23  Nils Pyle, PharmD PGY1 Pharmacy Resident

## 2023-02-01 ENCOUNTER — Inpatient Hospital Stay: Payer: Medicare Other | Attending: Hematology

## 2023-02-01 ENCOUNTER — Inpatient Hospital Stay: Payer: Medicare Other

## 2023-02-01 VITALS — BP 93/51 | HR 65 | Temp 96.7°F | Resp 20 | Wt 152.8 lb

## 2023-02-01 DIAGNOSIS — C9000 Multiple myeloma not having achieved remission: Secondary | ICD-10-CM | POA: Insufficient documentation

## 2023-02-01 DIAGNOSIS — Z5112 Encounter for antineoplastic immunotherapy: Secondary | ICD-10-CM | POA: Diagnosis present

## 2023-02-01 LAB — COMPREHENSIVE METABOLIC PANEL
ALT: 18 U/L (ref 0–44)
AST: 22 U/L (ref 15–41)
Albumin: 4.1 g/dL (ref 3.5–5.0)
Alkaline Phosphatase: 45 U/L (ref 38–126)
Anion gap: 9 (ref 5–15)
BUN: 12 mg/dL (ref 8–23)
CO2: 27 mmol/L (ref 22–32)
Calcium: 8.5 mg/dL — ABNORMAL LOW (ref 8.9–10.3)
Chloride: 99 mmol/L (ref 98–111)
Creatinine, Ser: 0.91 mg/dL (ref 0.44–1.00)
GFR, Estimated: >60 mL/min (ref >60)
Glucose, Bld: 97 mg/dL (ref 70–99)
Potassium: 2.9 mmol/L — ABNORMAL LOW (ref 3.5–5.1)
Sodium: 135 mmol/L (ref 135–145)
Total Bilirubin: 0.7 mg/dL (ref 0.3–1.2)
Total Protein: 7.2 g/dL (ref 6.5–8.1)

## 2023-02-01 LAB — CBC WITH DIFFERENTIAL/PLATELET
Abs Immature Granulocytes: 0.01 10*3/uL (ref 0.00–0.07)
Basophils Absolute: 0.1 10*3/uL (ref 0.0–0.1)
Basophils Relative: 2 %
Eosinophils Absolute: 0.2 10*3/uL (ref 0.0–0.5)
Eosinophils Relative: 5 %
HCT: 35.4 % — ABNORMAL LOW (ref 36.0–46.0)
Hemoglobin: 12 g/dL (ref 12.0–15.0)
Immature Granulocytes: 0 %
Lymphocytes Relative: 22 %
Lymphs Abs: 1 10*3/uL (ref 0.7–4.0)
MCH: 34.2 pg — ABNORMAL HIGH (ref 26.0–34.0)
MCHC: 33.9 g/dL (ref 30.0–36.0)
MCV: 100.9 fL — ABNORMAL HIGH (ref 80.0–100.0)
Monocytes Absolute: 0.4 10*3/uL (ref 0.1–1.0)
Monocytes Relative: 9 %
Neutro Abs: 2.7 10*3/uL (ref 1.7–7.7)
Neutrophils Relative %: 62 %
Platelets: 129 10*3/uL — ABNORMAL LOW (ref 150–400)
RBC: 3.51 MIL/uL — ABNORMAL LOW (ref 3.87–5.11)
RDW: 15.5 % (ref 11.5–15.5)
WBC: 4.4 10*3/uL (ref 4.0–10.5)
nRBC: 0 % (ref 0.0–0.2)

## 2023-02-01 LAB — MAGNESIUM: Magnesium: 2.3 mg/dL (ref 1.7–2.4)

## 2023-02-01 MED ORDER — PROCHLORPERAZINE MALEATE 10 MG PO TABS
10.0000 mg | ORAL_TABLET | Freq: Four times a day (QID) | ORAL | Status: DC | PRN
  Administered 2023-02-01: 10 mg via ORAL
  Filled 2023-02-01: qty 1

## 2023-02-01 MED ORDER — POTASSIUM CHLORIDE CRYS ER 20 MEQ PO TBCR
40.0000 meq | EXTENDED_RELEASE_TABLET | Freq: Once | ORAL | Status: AC
  Administered 2023-02-01: 40 meq via ORAL
  Filled 2023-02-01: qty 2

## 2023-02-01 MED ORDER — BORTEZOMIB CHEMO SQ INJECTION 3.5 MG (2.5MG/ML)
0.7000 mg/m2 | Freq: Once | INTRAMUSCULAR | Status: AC
  Administered 2023-02-01: 1.25 mg via SUBCUTANEOUS
  Filled 2023-02-01: qty 0.5

## 2023-02-01 MED ORDER — DEXAMETHASONE 4 MG PO TABS
20.0000 mg | ORAL_TABLET | Freq: Once | ORAL | Status: AC
  Administered 2023-02-01: 20 mg via ORAL
  Filled 2023-02-01: qty 5

## 2023-02-01 NOTE — Patient Instructions (Signed)
[facility]  Discharge Instructions: Thank you for choosing [facility] to provide your oncology and hematology care.  If you have a lab appointment with the Cancer Center - please note that after April 8th, 2024, all labs will be drawn in the cancer center.  You do not have to check in or register with the main entrance as you have in the past but will complete your check-in in the cancer center.  Wear comfortable clothing and clothing appropriate for easy access to any Portacath or PICC line.   We strive to give you quality time with your provider. You may need to reschedule your appointment if you arrive late (15 or more minutes).  Arriving late affects you and other patients whose appointments are after yours.  Also, if you miss three or more appointments without notifying the office, you may be dismissed from the clinic at the provider's discretion.      For prescription refill requests, have your pharmacy contact our office and allow 72 hours for refills to be completed.    Today you received the following chemotherapy and/or immunotherapy agents velcade    To help prevent nausea and vomiting after your treatment, we encourage you to take your nausea medication as directed.  BELOW ARE SYMPTOMS THAT SHOULD BE REPORTED IMMEDIATELY: *FEVER GREATER THAN 100.4 F (38 C) OR HIGHER *CHILLS OR SWEATING *NAUSEA AND VOMITING THAT IS NOT CONTROLLED WITH YOUR NAUSEA MEDICATION *UNUSUAL SHORTNESS OF BREATH *UNUSUAL BRUISING OR BLEEDING *URINARY PROBLEMS (pain or burning when urinating, or frequent urination) *BOWEL PROBLEMS (unusual diarrhea, constipation, pain near the anus) TENDERNESS IN MOUTH AND THROAT WITH OR WITHOUT PRESENCE OF ULCERS (sore throat, sores in mouth, or a toothache) UNUSUAL RASH, SWELLING OR PAIN  UNUSUAL VAGINAL DISCHARGE OR ITCHING   Items with * indicate a potential emergency and should be followed up as soon as possible or go to the  Emergency Department if any problems should occur.  Please show the CHEMOTHERAPY ALERT CARD or IMMUNOTHERAPY ALERT CARD at check-in to the Emergency Department and triage nurse.  Should you have questions after your visit or need to cancel or reschedule your appointment, please contact [facility] [phone]  and follow the prompts.  Office hours are 8:00 a.m. to 4:30 p.m. Monday - Friday. Please note that voicemails left after 4:00 p.m. may not be returned until the following business day.  We are closed weekends and major holidays. You have access to a nurse at all times for urgent questions. Please call the main number to the clinic [phone] and follow the prompts.  For any non-urgent questions, you may also contact your provider using MyChart. We now offer e-Visits for anyone 18 and older to request care online for non-urgent symptoms. For details visit [email or web address].   Also download the MyChart app! Go to the app store, search "MyChart", open the app, select [facility], and log in with your MyChart username and password.   

## 2023-02-01 NOTE — Progress Notes (Signed)
Treatment given per orders. Patient tolerated it well without problems. Vitals stable and discharged home from clinic ambulatory. Follow up as scheduled.  

## 2023-02-09 ENCOUNTER — Other Ambulatory Visit: Payer: Self-pay | Admitting: *Deleted

## 2023-02-09 ENCOUNTER — Inpatient Hospital Stay: Payer: Medicare Other | Admitting: Licensed Clinical Social Worker

## 2023-02-09 DIAGNOSIS — R1013 Epigastric pain: Secondary | ICD-10-CM

## 2023-02-09 DIAGNOSIS — I10 Essential (primary) hypertension: Secondary | ICD-10-CM

## 2023-02-09 DIAGNOSIS — C9000 Multiple myeloma not having achieved remission: Secondary | ICD-10-CM

## 2023-02-09 DIAGNOSIS — M545 Low back pain, unspecified: Secondary | ICD-10-CM

## 2023-02-09 DIAGNOSIS — D509 Iron deficiency anemia, unspecified: Secondary | ICD-10-CM

## 2023-02-09 NOTE — Progress Notes (Signed)
[facility] CSW Progress Note  Visual merchandiser  received a referral to contact pt regarding home services and medication management.  CSW spoke with pt over the phone.  Pt states she had been asleep when the phone rang.  Pt unable to clearly answer most questions.  CSW requested permission to contact pt's daughter, Maxine Glenn, to which pt verbalized agreement.  CSW contacted [place] by phone.  [name] verified that pt resides w/ her spouse (who has dementia) and pt's older sister.  Maxine Glenn resides next door.  According to [facility] she and pt's sister provide assistance to pt as needed.  Pt was recently confused regarding her medication; however, [name] states the pharmacy has organized pt's medication for her and this has improved.  [name] reports pt has periods of time where she becomes more anxious than other times and requires more assistance, but once these periods pass pt functions well.  CSW inquired if a home care referral would be appropriate in order for an RN to check on pt as well as home PT.  [name] verbalized agreement w/ referral.  RN informed and referral sent.  CSW to remain available as appropriate to provide support throughout duration of treatment.        Rachel Moulds, LCSW Clinical Social Worker [facility]

## 2023-02-09 NOTE — Progress Notes (Signed)
Referral made to Home health for Nursing, PT and Social work to evaluate and address needs.

## 2023-02-10 ENCOUNTER — Other Ambulatory Visit: Payer: Self-pay | Admitting: Family Medicine

## 2023-02-10 DIAGNOSIS — J301 Allergic rhinitis due to pollen: Secondary | ICD-10-CM

## 2023-02-10 DIAGNOSIS — R32 Unspecified urinary incontinence: Secondary | ICD-10-CM

## 2023-02-12 ENCOUNTER — Emergency Department (HOSPITAL_COMMUNITY)
Admission: EM | Admit: 2023-02-12 | Discharge: 2023-02-12 | Disposition: A | Discharge status: Home / Self Care | Payer: Medicare Other | Attending: Emergency Medicine | Admitting: Emergency Medicine

## 2023-02-12 ENCOUNTER — Other Ambulatory Visit: Payer: Self-pay

## 2023-02-12 ENCOUNTER — Emergency Department (HOSPITAL_COMMUNITY): Payer: Medicare Other

## 2023-02-12 DIAGNOSIS — D72829 Elevated white blood cell count, unspecified: Secondary | ICD-10-CM | POA: Insufficient documentation

## 2023-02-12 DIAGNOSIS — I2510 Atherosclerotic heart disease of native coronary artery without angina pectoris: Secondary | ICD-10-CM | POA: Diagnosis not present

## 2023-02-12 DIAGNOSIS — Z7982 Long term (current) use of aspirin: Secondary | ICD-10-CM | POA: Insufficient documentation

## 2023-02-12 DIAGNOSIS — N3000 Acute cystitis without hematuria: Secondary | ICD-10-CM | POA: Diagnosis not present

## 2023-02-12 DIAGNOSIS — R1084 Generalized abdominal pain: Secondary | ICD-10-CM | POA: Diagnosis present

## 2023-02-12 DIAGNOSIS — Z7901 Long term (current) use of anticoagulants: Secondary | ICD-10-CM | POA: Insufficient documentation

## 2023-02-12 LAB — COMPREHENSIVE METABOLIC PANEL
ALT: 16 U/L (ref 0–44)
AST: 17 U/L (ref 15–41)
Albumin: 3.5 g/dL (ref 3.5–5.0)
Alkaline Phosphatase: 45 U/L (ref 38–126)
Anion gap: 9 (ref 5–15)
BUN: 12 mg/dL (ref 8–23)
CO2: 27 mmol/L (ref 22–32)
Calcium: 8.4 mg/dL — ABNORMAL LOW (ref 8.9–10.3)
Chloride: 99 mmol/L (ref 98–111)
Creatinine, Ser: 0.9 mg/dL (ref 0.44–1.00)
GFR, Estimated: >60 mL/min (ref >60)
Glucose, Bld: 102 mg/dL — ABNORMAL HIGH (ref 70–99)
Potassium: 3.8 mmol/L (ref 3.5–5.1)
Sodium: 135 mmol/L (ref 135–145)
Total Bilirubin: 0.8 mg/dL (ref 0.3–1.2)
Total Protein: 6.4 g/dL — ABNORMAL LOW (ref 6.5–8.1)

## 2023-02-12 LAB — URINALYSIS, ROUTINE W REFLEX MICROSCOPIC
Bilirubin Urine: NEGATIVE
Glucose, UA: NEGATIVE mg/dL
Hgb urine dipstick: NEGATIVE
Ketones, ur: NEGATIVE mg/dL
Nitrite: NEGATIVE
Protein, ur: NEGATIVE mg/dL
Specific Gravity, Urine: 1.008 (ref 1.005–1.030)
WBC, UA: >50 WBC/hpf (ref 0–5)
pH: 7 (ref 5.0–8.0)

## 2023-02-12 LAB — CBC
HCT: 33.5 % — ABNORMAL LOW (ref 36.0–46.0)
Hemoglobin: 11.4 g/dL — ABNORMAL LOW (ref 12.0–15.0)
MCH: 34.5 pg — ABNORMAL HIGH (ref 26.0–34.0)
MCHC: 34 g/dL (ref 30.0–36.0)
MCV: 101.5 fL — ABNORMAL HIGH (ref 80.0–100.0)
Platelets: 109 10*3/uL — ABNORMAL LOW (ref 150–400)
RBC: 3.3 MIL/uL — ABNORMAL LOW (ref 3.87–5.11)
RDW: 15.3 % (ref 11.5–15.5)
WBC: 3.8 10*3/uL — ABNORMAL LOW (ref 4.0–10.5)
nRBC: 0 % (ref 0.0–0.2)

## 2023-02-12 LAB — LIPASE, BLOOD: Lipase: 29 U/L (ref 11–51)

## 2023-02-12 LAB — TROPONIN I (HIGH SENSITIVITY): Troponin I (High Sensitivity): 8 ng/L (ref <18)

## 2023-02-12 MED ORDER — CEPHALEXIN 500 MG PO CAPS
500.0000 mg | ORAL_CAPSULE | Freq: Once | ORAL | Status: AC
  Administered 2023-02-12: 500 mg via ORAL
  Filled 2023-02-12: qty 1

## 2023-02-12 MED ORDER — SODIUM CHLORIDE 0.9 % IV BOLUS
500.0000 mL | Freq: Once | INTRAVENOUS | Status: AC
  Administered 2023-02-12: 500 mL via INTRAVENOUS

## 2023-02-12 MED ORDER — CEPHALEXIN 500 MG PO CAPS: 500.0000 mg | ORAL_CAPSULE | Freq: Two times a day (BID) | ORAL | 0 refills | Status: DC

## 2023-02-12 MED ORDER — IOHEXOL 300 MG/ML  SOLN
100.0000 mL | Freq: Once | INTRAMUSCULAR | Status: AC | PRN
  Administered 2023-02-12: 100 mL via INTRAVENOUS

## 2023-02-12 NOTE — ED Notes (Signed)
Sister stated pt daughter is on the way to pick her up.

## 2023-02-12 NOTE — ED Notes (Signed)
Helped patient ambulate to the bathroom. She is very weak and needed help with walking due to unsteady on her feet. Urine collected and sent to lab.

## 2023-02-12 NOTE — ED Triage Notes (Signed)
Pt called Courtney Rogers due to being hot. When Courtney Rogers arrived pt c/o abd pain x 3 days and painful urination. Courtney Rogers states pt lives with husband and she uses a walker.

## 2023-02-12 NOTE — ED Provider Notes (Signed)
[place] EMERGENCY DEPARTMENT AT [facility] Provider Note   CSN: [id number] Arrival date & time: 02/12/23  1221     History  Chief Complaint  Patient presents with   Abdominal Pain    Courtney Rogers is a 72 y.o. female, history of CAD, cardiac pacemaker, multiple myeloma, who presents to the ED secondary to generalized abdominal pain, and weakness x 1 day.  She states she felt just very hot and cold, and weak today, and achy pain in her abdomen.  States has been persistent, and is mild to moderate in nature.  Denies any nausea or vomiting.  Just feels unwell.  Has not had any diarrhea.  Denies any constipation.  No chest pain or shortness of breath currently.  Does endorse increased urinary frequency, no vaginal bleeding. Home Medications Prior to Admission medications   Medication Sig Start Date End Date Taking? Authorizing Provider  cephALEXin (KEFLEX) 500 MG capsule Take 1 capsule (500 mg total) by mouth 2 (two) times daily. 02/12/23  Yes Damiya Sandefur L, PA  acetaminophen (TYLENOL) 500 MG tablet Take 1,000 mg by mouth 2 (two) times daily as needed for moderate pain or headache.    [provider]  acyclovir (ZOVIRAX) 400 MG tablet TAKE ONE TABLET BY MOUTH TWICE DAILY 12/20/22   Doreatha Massed, MD  amLODipine (NORVASC) 10 MG tablet TAKE ONE TABLET BY MOUTH ONCE DAILY 01/26/23   Doreatha Massed, MD  ascorbic acid (VITAMIN C) 500 MG tablet Take 500 mg by mouth daily. 02/07/22   [provider]  aspirin EC 81 MG tablet Take 81 mg by mouth at bedtime.    [provider]  B Complex Vitamins (VITAMIN B COMPLEX) TABS Take 1 tablet by mouth daily.    [provider]  B Complex-Folic Acid (B COMPLEX VITAMINS, W/ FA,) CAPS Take 1 capsule by mouth daily. 09/09/21   [provider]  bortezomib SQ (VELCADE) 3.5 MG injection Inject into the skin once. 08/10/21   [provider]  busPIRone (BUSPAR) 5 MG tablet Take 5 mg by  mouth daily.    Elam Dutch, MD  Calcium Carbonate-Vitamin D (CALCIUM 600+D PO) Take 1 tablet by mouth 2 (two) times daily.    [provider]  chlorthalidone (HYGROTON) 25 MG tablet Take 25 mg by mouth daily. 06/04/22   [provider]  cholecalciferol (VITAMIN D3) 10 MCG (400 UNIT) TABS tablet Take 400 Units by mouth daily. 09/30/22   [provider]  cyanocobalamin (VITAMIN B12) 1000 MCG tablet Take by mouth. 10/12/20   [provider]  cyclobenzaprine (FLEXERIL) 10 MG tablet TAKE ONE-HALF TABLET BY MOUTH TWICE DAILY AS NEEDED FOR MUSCLE SPASMS 12/23/22   Gilmore Laroche, FNP  Daratumumab-Hyaluronidase-fihj (DARZALEX FASPRO [place]) Inject into the skin. 08/10/21   [provider]  diclofenac Sodium (VOLTAREN) 1 % GEL Apply 4 g topically 4 (four) times daily. 11/19/21   Gilmore Laroche, FNP  fluticasone (FLONASE) 50 MCG/ACT nasal spray USE 2 SPRAYS IN EACH NOSTRIL ONCE DAILY 11/28/22   Gilmore Laroche, FNP  fluticasone-salmeterol (ADVAIR) 250-50 MCG/ACT AEPB Inhale into the lungs. 10/12/20   [provider]  folic acid (FOLVITE) 1 MG tablet TAKE 1 TABLET BY MOUTH ONCE DAILY. 03/15/22   Doreatha Massed, MD  gabapentin (NEURONTIN) 100 MG capsule Take 100 mg by mouth 3 (three) times daily. 05/07/19   [provider]  HYDROcodone-acetaminophen (NORCO/VICODIN) 5-325 MG tablet TAKE ONE TABLET BY MOUTH EVERY 8 HOURS AS NEEDED FOR MODERATE PAIN  10/10/22   Doreatha Massed, MD  Hyoscyamine Sulfate SL 0.125 MG SUBL     [provider]  lenalidomide (REVLIMID) 10 MG capsule Take 1 capsule (10 mg total) by mouth daily. 21 days on, 7 days off every 28 days 01/13/23   Doreatha Massed, MD  levocetirizine (XYZAL) 5 MG tablet TAKE ONE TABLET BY MOUTH IN THE EVENING 01/26/23   Gilmore Laroche, FNP  lidocaine (HM LIDOCAINE PATCH) 4 % Place 1 patch onto the skin daily. 03/21/22   Rondel Baton, MD  losartan (COZAAR) 100 MG tablet TAKE ONE  TABLET BY MOUTH ONCE DAILY 01/26/23   Gilmore Laroche, FNP  magnesium oxide (MAG-OX) 400 (240 Mg) MG tablet Take 1 tablet (400 mg total) by mouth 2 (two) times daily. 10/03/22   Doreatha Massed, MD  metoprolol tartrate (LOPRESSOR) 50 MG tablet Take 1 tablet (50 mg total) by mouth 2 (two) times daily. 10/07/21   [name], FNP  montelukast (SINGULAIR) 10 MG tablet TAKE ONE TABLET BY MOUTH AT BEDTIME 02/10/23   Gilmore Laroche, FNP  MYRBETRIQ 50 MG TB24 tablet TAKE ONE TABLET BY MOUTH ONCE DAILY 02/10/23   Gilmore Laroche, FNP  nitroGLYCERIN (NITROSTAT) 0.4 MG SL tablet Place 0.4 mg under the tongue every 5 (five) minutes as needed for chest pain.    [provider]  pantoprazole (PROTONIX) 40 MG tablet Take 40 mg by mouth daily. 08/10/20   [provider]  potassium chloride SA (KLOR-CON M) 20 MEQ tablet Take 1 tablet (20 mEq total) by mouth 2 (two) times daily. 01/16/23   Del Nigel Berthold, FNP  primidone (MYSOLINE) 50 MG tablet Take by mouth. 03/31/21   [provider]  Probiotic Product (GNP PROBIOTIC COLON SUPPORT) CAPS Take 1 capsule by mouth daily. 07/08/21   [provider]  Saccharomyces boulardii (PROBIOTIC) 250 MG CAPS Take 1 capsule by mouth daily. 12/09/22   [provider]  sertraline (ZOLOFT) 25 MG tablet Take 1 tablet (25 mg total) by mouth daily. 12/19/22   Gilmore Laroche, FNP  simvastatin (ZOCOR) 40 MG tablet TAKE ONE TABLET BY MOUTH AT BEDTIME 10/24/22   Gilmore Laroche, FNP  tadalafil, PAH, (ALYQ) 20 MG tablet Take 2 tablets every day by oral route.    [provider]  topiramate (TOPAMAX) 25 MG tablet Take 25 mg by mouth 2 (two) times daily. 01/06/23   [provider]  warfarin (COUMADIN) 3 MG tablet Take 3 mg by mouth daily. 06/23/21   [provider]      Allergies    Amoxicillin-pot clavulanate, Clarithromycin, Erythromycin, Lisinopril, and Amoxicillin    Review of Systems   Review of Systems   Gastrointestinal:  Positive for abdominal pain. Negative for nausea and vomiting.  Genitourinary:  Positive for frequency.    Physical Exam Updated Vital Signs BP 116/65   Pulse 67   Temp 97.8 F (36.6 C) (Oral)   Resp 18   Ht 5\' 4"  (1.626 m)   Wt 69 kg   SpO2 98%   BMI 26.11 kg/m  Physical Exam Vitals and nursing note reviewed.  Constitutional:      General: She is not in acute distress.    Appearance: She is well-developed.  HENT:     Head: Normocephalic and atraumatic.  Eyes:     Conjunctiva/sclera: Conjunctivae normal.  Cardiovascular:     Rate and Rhythm: Normal rate and regular rhythm.     Heart sounds: No murmur heard. Pulmonary:  Effort: Pulmonary effort is normal. No respiratory distress.     Breath sounds: Normal breath sounds.  Abdominal:     Palpations: Abdomen is soft.     Tenderness: There is generalized abdominal tenderness. There is no guarding or rebound.  Musculoskeletal:        General: No swelling.     Cervical back: Neck supple.  Skin:    General: Skin is warm and dry.     Capillary Refill: Capillary refill takes less than 2 seconds.  Neurological:     Mental Status: She is alert.  Psychiatric:        Mood and Affect: Mood normal.     ED Results / Procedures / Treatments   Labs (all labs ordered are listed, but only abnormal results are displayed) Labs Reviewed  COMPREHENSIVE METABOLIC PANEL - Abnormal; Notable for the following components:      Result Value   Glucose, Bld 102 (*)    Calcium 8.4 (*)    Total Protein 6.4 (*)    All other components within normal limits  CBC - Abnormal; Notable for the following components:   WBC 3.8 (*)    RBC 3.30 (*)    Hemoglobin 11.4 (*)    HCT 33.5 (*)    MCV 101.5 (*)    MCH 34.5 (*)    Platelets 109 (*)    All other components within normal limits  URINALYSIS, ROUTINE W REFLEX MICROSCOPIC - Abnormal; Notable for the following components:   APPearance HAZY (*)    Leukocytes,Ua LARGE  (*)    Bacteria, UA RARE (*)    All other components within normal limits  LIPASE, BLOOD  TROPONIN I (HIGH SENSITIVITY)    EKG EKG Interpretation Date/Time:  Sunday February 12 2023 12:35:08 EDT Ventricular Rate:  68 PR Interval:  147 QRS Duration:  238 QT Interval:  520 QTC Calculation: 554 R Axis:   -66  Text Interpretation: Sinus rhythm Left bundle branch block Confirmed by Eber Hong ([id number]) on 02/12/2023 3:22:12 PM  Radiology CT ABDOMEN PELVIS W CONTRAST  Result Date: 02/12/2023 CLINICAL DATA:  Abdominal pain with dysuria 3 days. EXAM: CT ABDOMEN AND PELVIS WITH CONTRAST TECHNIQUE: Multidetector CT imaging of the abdomen and pelvis was performed using the standard protocol following bolus administration of intravenous contrast. RADIATION DOSE REDUCTION: This exam was performed according to the departmental dose-optimization program which includes automated exposure control, adjustment of the mA and/or kV according to patient size and/or use of iterative reconstruction technique. CONTRAST:  OMNIPAQUE IOHEXOL 300 MG/ML  SOLN COMPARISON:  03/01/2021 FINDINGS: Lower chest: Borderline cardiomegaly with cardiac pacer leads present. Chronic changes in the lung bases. No acute airspace process or effusion. Hepatobiliary: Liver, gallbladder and biliary tree are normal. Pancreas: Normal. Spleen: Normal. Adrenals/Urinary Tract: Adrenal glands are normal. Kidneys are normal in size. 3-4 mm nonobstructing stone over the lower pole right kidney unchanged. No significant change in a 3.7 cm simple cyst over the lower pole right kidney. Ureters and bladder are normal. Stomach/Bowel: Stomach and [place] Corella bowel are normal. Appendix is not well visualized. Minimal diverticulosis of the colon. Moderate fecal retention over the rectum. Vascular/Lymphatic: Abdominal aorta is normal in caliber. Remaining vascular structures are unremarkable. No adenopathy. Reproductive: Previous hysterectomy. Adnexal  regions are unremarkable. Other: No free fluid or focal inflammatory change. Musculoskeletal: L1 compression fracture post kyphoplasty unchanged. No acute findings. IMPRESSION: 1. No acute findings in the abdomen/pelvis. 2. 3-4 mm nonobstructing right renal stone. Stable 3.7 cm simple  right renal cyst. No follow-up imaging is recommended. 3. Minimal diverticulosis of the colon. Moderate fecal retention over the rectum. 4. Borderline cardiomegaly. Electronically Signed   By: Elberta Fortis M.D.   On: 02/12/2023 16:19   DG Chest Portable 1 View  Result Date: 02/12/2023 CLINICAL DATA:  weakness.  Abdominal pain for 3 days. EXAM: PORTABLE CHEST 1 VIEW COMPARISON:  11/14/2022. FINDINGS: Patchy areas of scarring/fibrosis again noted in the right superior hilar region and right lung base region. Bilateral lung fields are otherwise clear. No acute consolidation or major lung collapse. Bilateral lateral costophrenic angles are clear. Normal cardio-mediastinal silhouette. There is a left sided 2-lead pacemaker. No acute osseous abnormalities. The soft tissues are within normal limits. No free air under the domes of diaphragm. IMPRESSION: No active disease. Electronically Signed   By: Jules Schick M.D.   On: 02/12/2023 15:03    Procedures Procedures    Medications Ordered in ED Medications  cephALEXin (KEFLEX) capsule 500 mg (has no administration in time range)  sodium chloride 0.9 % bolus 500 mL (500 mLs Intravenous New Bag/Given 02/12/23 1526)  iohexol (OMNIPAQUE) 300 MG/ML solution 100 mL (100 mLs Intravenous Contrast Given 02/12/23 1535)    ED Course/ Medical Decision Making/ A&P                                 Medical Decision Making Patient is a 72 year old female, here for generalized abdominal pain, and weakness has been going on for the last day.  We will obtain troponins given her age, and abdominal pain, as well as a CT abd pelvis given the generalized abdominal pain and urinary symptoms.  Will  also obtain metabolic panel, and lipase for further eval  Amount and/or Complexity of Data Reviewed Labs: ordered.    Details: Large amount of leukocytes, many white blood cells count, rare bacteria in urine Radiology: ordered.    Details: CT abdomen pelvis, shows no obstructing stones, Tianna Baus stone, noted in the right renal pelvis, otherwise no acute findings Discussion of management or test interpretation with external provider(s): Discussed with patient, she is feeling better after fluids.  Troponin negative, chest x-ray reassuring, CT abdomen pelvis reassuring.  Believe her symptoms are likely secondary to urinary tract infection.  She is afebrile, and nontachycardic.  Well-appearing.  We will have her follow-up with her PCP.  Discharged with Keflex outpatient.  Return precautions emphasized  Risk Prescription drug management.   Final Clinical Impression(s) / ED Diagnoses Final diagnoses:  Acute cystitis without hematuria  Generalized abdominal pain    Rx / DC Orders ED Discharge Orders          Ordered    cephALEXin (KEFLEX) 500 MG capsule  2 times daily        02/12/23 1635              Ysabela Keisler, [place], [place] 02/12/23 1640    Vanetta Mulders, MD 02/17/23 1339

## 2023-02-12 NOTE — Discharge Instructions (Addendum)
Please follow-up with your primary care doctor, today you are found to have a urinary tract infection.  I have started you on Keflex, twice a day, for urinary tract infection, keep taking it even if you feel better.  Make sure you are drinking lots of fluids, and resting.  If you start having fevers, chills, worsening abdominal pain return to the ER.  You are mildly constipated, I recommend taking MiraLAX, to help with your constipation.

## 2023-02-13 NOTE — Progress Notes (Signed)
Care Guide Note  02/13/2023 Name: [name] MRN: [id number] DOB: 1950/08/06  Referred by: Gilmore Laroche, FNP Reason for referral : Care Coordination (Outreach to schedule with Pharm d )   Courtney Rogers is a 72 y.o. year old female who is a primary care patient of Gilmore Laroche, FNP. [name] was referred to the pharmacist for assistance related to Depression.    Successful contact was made with the patient to discuss pharmacy services including being ready for the pharmacist to call at least 5 minutes before the scheduled appointment time, to have medication bottles and any blood sugar or blood pressure readings ready for review. The patient agreed to meet with the pharmacist via with the pharmacist via telephone visit on (date/time).  01/31/2023  Penne Lash, RMA Care Guide [facility]  [place], [place] [address] Direct Dial: [phone] [email or web address]

## 2023-02-14 ENCOUNTER — Other Ambulatory Visit: Payer: Self-pay

## 2023-02-14 MED ORDER — LENALIDOMIDE 10 MG PO CAPS: ORAL_CAPSULE | ORAL | 0 refills | Status: DC

## 2023-02-14 NOTE — Telephone Encounter (Signed)
Chart reviewed. Revlimid refilled per last office note with Dr. Katragadda.  

## 2023-02-15 ENCOUNTER — Inpatient Hospital Stay: Payer: Medicare Other

## 2023-02-15 VITALS — BP 90/51 | HR 69 | Temp 97.0°F | Resp 20 | Wt 148.0 lb

## 2023-02-15 DIAGNOSIS — Z5112 Encounter for antineoplastic immunotherapy: Secondary | ICD-10-CM | POA: Diagnosis not present

## 2023-02-15 DIAGNOSIS — C9000 Multiple myeloma not having achieved remission: Secondary | ICD-10-CM

## 2023-02-15 LAB — COMPREHENSIVE METABOLIC PANEL
ALT: 16 U/L (ref 0–44)
AST: 19 U/L (ref 15–41)
Albumin: 3.8 g/dL (ref 3.5–5.0)
Alkaline Phosphatase: 50 U/L (ref 38–126)
Anion gap: 12 (ref 5–15)
BUN: 12 mg/dL (ref 8–23)
CO2: 25 mmol/L (ref 22–32)
Calcium: 9 mg/dL (ref 8.9–10.3)
Chloride: 99 mmol/L (ref 98–111)
Creatinine, Ser: 1.02 mg/dL — ABNORMAL HIGH (ref 0.44–1.00)
GFR, Estimated: 58 mL/min — ABNORMAL LOW (ref >60)
Glucose, Bld: 105 mg/dL — ABNORMAL HIGH (ref 70–99)
Potassium: 3.1 mmol/L — ABNORMAL LOW (ref 3.5–5.1)
Sodium: 136 mmol/L (ref 135–145)
Total Bilirubin: 0.7 mg/dL (ref 0.3–1.2)
Total Protein: 7.1 g/dL (ref 6.5–8.1)

## 2023-02-15 LAB — CBC WITH DIFFERENTIAL/PLATELET
Abs Immature Granulocytes: 0 10*3/uL (ref 0.00–0.07)
Basophils Absolute: 0.1 10*3/uL (ref 0.0–0.1)
Basophils Relative: 1 %
Eosinophils Absolute: 0.5 10*3/uL (ref 0.0–0.5)
Eosinophils Relative: 12 %
HCT: 34.5 % — ABNORMAL LOW (ref 36.0–46.0)
Hemoglobin: 11.9 g/dL — ABNORMAL LOW (ref 12.0–15.0)
Immature Granulocytes: 0 %
Lymphocytes Relative: 19 %
Lymphs Abs: 0.8 10*3/uL (ref 0.7–4.0)
MCH: 34.7 pg — ABNORMAL HIGH (ref 26.0–34.0)
MCHC: 34.5 g/dL (ref 30.0–36.0)
MCV: 100.6 fL — ABNORMAL HIGH (ref 80.0–100.0)
Monocytes Absolute: 0.5 10*3/uL (ref 0.1–1.0)
Monocytes Relative: 12 %
Neutro Abs: 2.4 10*3/uL (ref 1.7–7.7)
Neutrophils Relative %: 56 %
Platelets: 159 10*3/uL (ref 150–400)
RBC: 3.43 MIL/uL — ABNORMAL LOW (ref 3.87–5.11)
RDW: 15.2 % (ref 11.5–15.5)
WBC: 4.3 10*3/uL (ref 4.0–10.5)
nRBC: 0 % (ref 0.0–0.2)

## 2023-02-15 LAB — MAGNESIUM: Magnesium: 1.8 mg/dL (ref 1.7–2.4)

## 2023-02-15 MED ORDER — POTASSIUM CHLORIDE CRYS ER 20 MEQ PO TBCR
40.0000 meq | EXTENDED_RELEASE_TABLET | Freq: Once | ORAL | Status: AC
  Administered 2023-02-15: 40 meq via ORAL
  Filled 2023-02-15: qty 2

## 2023-02-15 MED ORDER — BORTEZOMIB CHEMO SQ INJECTION 3.5 MG (2.5MG/ML)
0.7000 mg/m2 | Freq: Once | INTRAMUSCULAR | Status: AC
  Administered 2023-02-15: 1.25 mg via SUBCUTANEOUS
  Filled 2023-02-15: qty 0.5

## 2023-02-15 MED ORDER — PROCHLORPERAZINE MALEATE 10 MG PO TABS
10.0000 mg | ORAL_TABLET | Freq: Once | ORAL | Status: AC
  Administered 2023-02-15: 10 mg via ORAL
  Filled 2023-02-15: qty 1

## 2023-02-15 NOTE — Progress Notes (Signed)
I performed brief bedside evaluation of patient during her injection appointment.  I can attest that per my professional assessment, she would qualify for and benefit from home health nurse, physical therapist, and social worker.  Carnella Guadalajara, PA-C 02/15/23 3:17 PM

## 2023-02-15 NOTE — Patient Instructions (Signed)
[facility]  Discharge Instructions: Thank you for choosing [facility] to provide your oncology and hematology care.  If you have a lab appointment with the Cancer Center - please note that after April 8th, 2024, all labs will be drawn in the cancer center.  You do not have to check in or register with the main entrance as you have in the past but will complete your check-in in the cancer center.  Wear comfortable clothing and clothing appropriate for easy access to any Portacath or PICC line.   We strive to give you quality time with your provider. You may need to reschedule your appointment if you arrive late (15 or more minutes).  Arriving late affects you and other patients whose appointments are after yours.  Also, if you miss three or more appointments without notifying the office, you may be dismissed from the clinic at the provider's discretion.      For prescription refill requests, have your pharmacy contact our office and allow 72 hours for refills to be completed.    Today you received the following chemotherapy and/or immunotherapy agents Velcade      To help prevent nausea and vomiting after your treatment, we encourage you to take your nausea medication as directed.  BELOW ARE SYMPTOMS THAT SHOULD BE REPORTED IMMEDIATELY: *FEVER GREATER THAN 100.4 F (38 C) OR HIGHER *CHILLS OR SWEATING *NAUSEA AND VOMITING THAT IS NOT CONTROLLED WITH YOUR NAUSEA MEDICATION *UNUSUAL SHORTNESS OF BREATH *UNUSUAL BRUISING OR BLEEDING *URINARY PROBLEMS (pain or burning when urinating, or frequent urination) *BOWEL PROBLEMS (unusual diarrhea, constipation, pain near the anus) TENDERNESS IN MOUTH AND THROAT WITH OR WITHOUT PRESENCE OF ULCERS (sore throat, sores in mouth, or a toothache) UNUSUAL RASH, SWELLING OR PAIN  UNUSUAL VAGINAL DISCHARGE OR ITCHING   Items with * indicate a potential emergency and should be followed up as soon as possible or go to the  Emergency Department if any problems should occur.  Please show the CHEMOTHERAPY ALERT CARD or IMMUNOTHERAPY ALERT CARD at check-in to the Emergency Department and triage nurse.  Should you have questions after your visit or need to cancel or reschedule your appointment, please contact [facility]ENTER AT [facility] [phone]  and follow the prompts.  Office hours are 8:00 a.m. to 4:30 p.m. Monday - Friday. Please note that voicemails left after 4:00 p.m. may not be returned until the following business day.  We are closed weekends and major holidays. You have access to a nurse at all times for urgent questions. Please call the main number to the clinic [phone] and follow the prompts.  For any non-urgent questions, you may also contact your provider using MyChart. We now offer e-Visits for anyone 58 and older to request care online for non-urgent symptoms. For details visit [email or web address].   Also download the MyChart app! Go to the app store, search "MyChart", open the app, select [place], and log in with your MyChart username and password.

## 2023-02-15 NOTE — Progress Notes (Signed)
Patient presents today for Velcade injection per providers order.  Vital signs and labs within parameters for treatment.  Patient recently diagnosed and currently on antibiotics for UTI.  MD aware.  Per Dr. Anders Simmonds okay to proceed with injection.   Stable during administration without incident; injection site WNL; see MAR for injection details.  Patient tolerated procedure well and without incident.  No questions or complaints noted at this time.

## 2023-02-17 LAB — KAPPA/LAMBDA LIGHT CHAINS
Kappa free light chain: 20.9 mg/L — ABNORMAL HIGH (ref 3.3–19.4)
Kappa, lambda light chain ratio: 0.63 (ref 0.26–1.65)
Lambda free light chains: 33 mg/L — ABNORMAL HIGH (ref 5.7–26.3)

## 2023-02-20 ENCOUNTER — Telehealth: Payer: Self-pay | Admitting: Family Medicine

## 2023-02-20 DIAGNOSIS — R1084 Generalized abdominal pain: Secondary | ICD-10-CM

## 2023-02-20 NOTE — Telephone Encounter (Signed)
Patient son calling says pt got pill case mixed up with her husbands- wanting a call back to discuss and make sure the pills she took won't interfere with her other medications. Please advise Thank you

## 2023-02-20 NOTE — Telephone Encounter (Signed)
Spoke to pt son advised for him to go by the pharamcy to ask pharmacist on interactions.

## 2023-02-21 LAB — PROTEIN ELECTROPHORESIS, SERUM
A/G Ratio: 1.3 (ref 0.7–1.7)
Albumin ELP: 3.5 g/dL (ref 2.9–4.4)
Alpha-1-Globulin: 0.4 g/dL (ref 0.0–0.4)
Alpha-2-Globulin: 0.9 g/dL (ref 0.4–1.0)
Beta Globulin: 1 g/dL (ref 0.7–1.3)
Gamma Globulin: 0.5 g/dL (ref 0.4–1.8)
Globulin, Total: 2.8 g/dL (ref 2.2–3.9)
M-Spike, %: 0.3 g/dL — ABNORMAL HIGH
Total Protein ELP: 6.3 g/dL (ref 6.0–8.5)

## 2023-02-21 LAB — IMMUNOFIXATION ELECTROPHORESIS
IgA: 339 mg/dL (ref 64–422)
IgG (Immunoglobin G), Serum: 582 mg/dL — ABNORMAL LOW (ref 586–1602)
IgM (Immunoglobulin M), Srm: 57 mg/dL (ref 26–217)
Total Protein ELP: 6.3 g/dL (ref 6.0–8.5)

## 2023-02-24 ENCOUNTER — Other Ambulatory Visit: Payer: Self-pay | Admitting: Family Medicine

## 2023-02-24 ENCOUNTER — Encounter: Payer: Self-pay | Admitting: Family Medicine

## 2023-02-24 ENCOUNTER — Ambulatory Visit (INDEPENDENT_AMBULATORY_CARE_PROVIDER_SITE_OTHER): Payer: Medicare Other | Admitting: Family Medicine

## 2023-02-24 ENCOUNTER — Ambulatory Visit (HOSPITAL_BASED_OUTPATIENT_CLINIC_OR_DEPARTMENT_OTHER)
Admission: RE | Admit: 2023-02-24 | Discharge: 2023-02-24 | Disposition: A | Discharge status: Home / Self Care | Payer: Medicare Other | Source: Ambulatory Visit | Attending: Family Medicine | Admitting: Family Medicine

## 2023-02-24 VITALS — BP 119/76 | HR 93 | Ht 64.0 in | Wt 144.0 lb

## 2023-02-24 DIAGNOSIS — Z79899 Other long term (current) drug therapy: Secondary | ICD-10-CM | POA: Diagnosis not present

## 2023-02-24 DIAGNOSIS — R269 Unspecified abnormalities of gait and mobility: Secondary | ICD-10-CM

## 2023-02-24 DIAGNOSIS — N281 Cyst of kidney, acquired: Secondary | ICD-10-CM | POA: Insufficient documentation

## 2023-02-24 DIAGNOSIS — G8929 Other chronic pain: Secondary | ICD-10-CM

## 2023-02-24 DIAGNOSIS — G479 Sleep disorder, unspecified: Secondary | ICD-10-CM | POA: Diagnosis not present

## 2023-02-24 DIAGNOSIS — R1084 Generalized abdominal pain: Secondary | ICD-10-CM | POA: Diagnosis present

## 2023-02-24 DIAGNOSIS — E7849 Other hyperlipidemia: Secondary | ICD-10-CM

## 2023-02-24 DIAGNOSIS — M5030 Other cervical disc degeneration, unspecified cervical region: Secondary | ICD-10-CM

## 2023-02-24 DIAGNOSIS — M5441 Lumbago with sciatica, right side: Secondary | ICD-10-CM

## 2023-02-24 DIAGNOSIS — R413 Other amnesia: Secondary | ICD-10-CM

## 2023-02-24 DIAGNOSIS — N3946 Mixed incontinence: Secondary | ICD-10-CM

## 2023-02-24 DIAGNOSIS — E559 Vitamin D deficiency, unspecified: Secondary | ICD-10-CM

## 2023-02-24 DIAGNOSIS — R7301 Impaired fasting glucose: Secondary | ICD-10-CM

## 2023-02-24 DIAGNOSIS — E038 Other specified hypothyroidism: Secondary | ICD-10-CM

## 2023-02-24 DIAGNOSIS — Z604 Social exclusion and rejection: Secondary | ICD-10-CM

## 2023-02-24 MED ORDER — HYDROXYZINE PAMOATE 25 MG PO CAPS: 25.0000 mg | ORAL_CAPSULE | Freq: Every evening | ORAL | 0 refills | Status: DC | PRN

## 2023-02-24 MED ORDER — PANTOPRAZOLE SODIUM 40 MG PO TBEC
40.0000 mg | DELAYED_RELEASE_TABLET | Freq: Two times a day (BID) | ORAL | 1 refills | Status: DC
Start: 2023-02-24 — End: 2023-05-03

## 2023-02-24 NOTE — Progress Notes (Unsigned)
Established Patient Office Visit  Subjective:  Patient ID: Courtney Rogers, female    DOB: [birth year]  Age: 72 y.o. MRN: [id number]  CC:  Chief Complaint  Patient presents with   Care Management    4 month f/u   Abdominal Pain    Pt reports abdominal pain all week, pain level is a 8 out of 10.     [name] is a 72 y.o. female with past medical history of *** presents for f/u of *** chronic medical conditions.  Past Medical History:  Diagnosis Date   Acute myocardial infarction [facility]) 2009   CAD/no stent medically managed   Anxiety disorder    CAD (coronary artery disease)    Cancer (HCC)    multiple myeloma   Hyperlipidemia    Hypertension    IBS (irritable bowel syndrome)    Non-ulcer dyspepsia 04/30/2009   Qualifier: Diagnosis of  By: Courtney Rogers, Sandi L    Overactive bladder    PE (pulmonary embolism) 10/2012   left lung   Presence of permanent cardiac pacemaker    Sleep apnea     Past Surgical History:  Procedure Laterality Date   ABDOMINAL HYSTERECTOMY     BSO secondary to cyst     CARDIAC CATHETERIZATION     COLONOSCOPY  12/2009   Dr. Aleene Davidson, propofol, normal. Next TCS 12/2019   COLONOSCOPY N/A 05/22/2017   Examined portion ileum was normal, significant looping of colon, external hemorrhoids and rectal bleeding due to internal hemorrhoids, mild diverticulosis procedure: COLONOSCOPY;  Surgeon: West Bali, Rogers;  [facility]: [facility];  Service: Endoscopy;  Laterality: N/A;  10:30am   ESOPHAGOGASTRODUODENOSCOPY  05/11/2009   schatzki ring/small hiatal hernia/path:gastritis   ESOPHAGOGASTRODUODENOSCOPY N/A 05/22/2017   Multiple gastric polyps with biopsy benign fundic gland polyp, small bowel biopsy negative for celiac, gastric biopsy with mild gastritis but no H. pylori procedure: ESOPHAGOGASTRODUODENOSCOPY (EGD);  Surgeon: West Bali, Rogers;  Location: [facility];  Service: Endoscopy;  Laterality: N/A;   ESOPHAGOGASTRODUODENOSCOPY  (EGD) WITH ESOPHAGEAL DILATION N/A 04/01/2013   [id number]:[id number] at the gastroesophageal juction/multiple small polyps/mild gastritis   GIVENS CAPSULE STUDY N/A 06/07/2017   Frequent gastric erosions, normal small bowel mucosa.  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    Family History  Problem Relation Age of Onset   Colon polyps Neg Hx    Colon cancer Neg Hx     Social History   Socioeconomic History   Marital status: Married    Spouse name: Doctor, general practice   Number of children: 3   Years of education: 16   Highest education level: Not on file  Occupational History   Not on file  Tobacco Use   Smoking status: Never    Passive exposure: Never   Smokeless tobacco: Never   Tobacco comments:    Never smoked   Vaping Use   Vaping status: Never Used  Substance and Sexual Activity   Alcohol use: Not Currently    Comment: occ wine   Drug use: No   Sexual activity: Not Currently  Other Topics Concern   Not on file  Social History Narrative      Lives with husband-51 years 952 in Aug 2021   Daughter  is close by, 2 sons live further away   One IN [place],ONE IN [place], ONE BESIDE HER.        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   Enjoys: reading, young adult      Diet: eats all food groups    Caffeine: coffee 1, tea daily soda-1 daily   Water: 1-2 cups      Wears seat belt    Does not use phone while driving   Smoke detectors at home    Licensed conveyancer -locked up      Left handed   One story home   Drinks caffeine   Social Determinants of Health   Financial Resource Strain: Low Risk  (12/21/2022)   Overall Financial Resource Strain (CARDIA)    Difficulty of Paying Living Expenses: Not hard at all  Food Insecurity: No Food Insecurity  (12/21/2022)   Hunger Vital Sign    Worried About Running Out of Food in the Last Year: Never true    Ran Out of Food in the Last Year: Never true  Transportation Needs: No Transportation Needs (12/21/2022)   PRAPARE - Administrator, Civil Service (Medical): No    Lack of Transportation (Non-Medical): No  Physical Activity: Inactive (12/21/2022)   Exercise Vital Sign    Days of Exercise per Week: 0 days    Minutes of Exercise per Session: 0 min  Stress: Stress Concern Present (12/21/2022)   [employer] of Occupational Health - Occupational Stress Questionnaire    Feeling of Stress : To some extent  Social Connections: Moderately Integrated (12/21/2022)   Social Connection and Isolation Panel [NHANES]    Frequency of Communication with Friends and Family: More than three times a week    Frequency of Social Gatherings with Friends and Family: More than three times a week    Attends Religious Services: More than 4 times per year    Active Member of [employer] or Organizations: No    Attends Banker Meetings: Never    Marital Status: Married  Catering manager Violence: Not At Risk (12/21/2022)   Humiliation, Afraid, Rape, and Kick questionnaire    Fear of Current or Ex-Partner: No    Emotionally Abused: No    Physically Abused: No    Sexually Abused: No    Outpatient Medications Prior to Visit  Medication Sig Dispense Refill   acetaminophen (TYLENOL) 500 MG tablet Take 1,000 mg by mouth 2 (two) times daily as needed for moderate pain or headache.     acyclovir (ZOVIRAX) 400 MG tablet TAKE ONE TABLET BY MOUTH TWICE DAILY 60 tablet 6   amLODipine (NORVASC) 10 MG tablet TAKE ONE TABLET BY MOUTH ONCE DAILY 90 tablet 0   ascorbic acid (VITAMIN C) 500 MG tablet Take 500 mg by mouth daily.     aspirin EC 81 MG tablet Take 81 mg by mouth at bedtime.     B Complex Vitamins (VITAMIN B COMPLEX) TABS Take 1 tablet by mouth daily.     B Complex-Folic Acid (B COMPLEX  VITAMINS, W/ FA,) CAPS Take 1 capsule by mouth daily.     bortezomib SQ (VELCADE) 3.5 MG injection Inject into the skin once.     busPIRone (BUSPAR) 5 MG tablet Take 5 mg by mouth daily.     Calcium Carbonate-Vitamin D (CALCIUM 600+D PO) Take 1 tablet by mouth 2 (two) times daily.     cephALEXin (KEFLEX) 500 MG capsule Take 1 capsule (  500 mg total) by mouth 2 (two) times daily. 13 capsule 0   chlorthalidone (HYGROTON) 25 MG tablet Take 25 mg by mouth daily.     cholecalciferol (VITAMIN D3) 10 MCG (400 UNIT) TABS tablet Take 400 Units by mouth daily.     cyanocobalamin (VITAMIN B12) 1000 MCG tablet Take by mouth.     cyclobenzaprine (FLEXERIL) 10 MG tablet TAKE ONE-HALF TABLET BY MOUTH TWICE DAILY AS NEEDED FOR MUSCLE SPASMS 20 tablet 0   Daratumumab-Hyaluronidase-fihj (DARZALEX FASPRO [place]) Inject into the skin.     diclofenac Sodium (VOLTAREN) 1 % GEL Apply 4 g topically 4 (four) times daily. 50 g 0   fluticasone (FLONASE) 50 MCG/ACT nasal spray USE 2 SPRAYS IN EACH NOSTRIL ONCE DAILY 16 g 0   fluticasone-salmeterol (ADVAIR) 250-50 MCG/ACT AEPB Inhale into the lungs.     folic acid (FOLVITE) 1 MG tablet TAKE 1 TABLET BY MOUTH ONCE DAILY. 30 tablet 11   gabapentin (NEURONTIN) 100 MG capsule Take 100 mg by mouth 3 (three) times daily.     HYDROcodone-acetaminophen (NORCO/VICODIN) 5-325 MG tablet TAKE ONE TABLET BY MOUTH EVERY 8 HOURS AS NEEDED FOR MODERATE PAIN 60 tablet 0   Hyoscyamine Sulfate SL 0.125 MG SUBL      lenalidomide (REVLIMID) 10 MG capsule Take 1 capsule (10 mg total) by mouth daily. 21 days on, 7 days off every 28 days 21 capsule 0   levocetirizine (XYZAL) 5 MG tablet TAKE ONE TABLET BY MOUTH IN THE EVENING 30 tablet 1   lidocaine (HM LIDOCAINE PATCH) 4 % Place 1 patch onto the skin daily. 14 patch 0   losartan (COZAAR) 100 MG tablet TAKE ONE TABLET BY MOUTH ONCE DAILY 30 tablet 3   magnesium oxide (MAG-OX) 400 (240 Mg) MG tablet Take 1 tablet (400 mg total) by mouth 2 (two) times  daily. 60 tablet 4   metoprolol tartrate (LOPRESSOR) 50 MG tablet Take 1 tablet (50 mg total) by mouth 2 (two) times daily. 180 tablet 1   montelukast (SINGULAIR) 10 MG tablet TAKE ONE TABLET BY MOUTH AT BEDTIME 30 tablet 3   MYRBETRIQ 50 MG TB24 tablet TAKE ONE TABLET BY MOUTH ONCE DAILY 30 tablet 3   nitroGLYCERIN (NITROSTAT) 0.4 MG SL tablet Place 0.4 mg under the tongue every 5 (five) minutes as needed for chest pain.     pantoprazole (PROTONIX) 40 MG tablet Take 40 mg by mouth daily.     potassium chloride SA (KLOR-CON M) 20 MEQ tablet Take 1 tablet (20 mEq total) by mouth 2 (two) times daily.     primidone (MYSOLINE) 50 MG tablet Take by mouth.     Probiotic Product (GNP PROBIOTIC COLON SUPPORT) CAPS Take 1 capsule by mouth daily.     Saccharomyces boulardii (PROBIOTIC) 250 MG CAPS Take 1 capsule by mouth daily.     sertraline (ZOLOFT) 25 MG tablet Take 1 tablet (25 mg total) by mouth daily. 30 tablet 3   simvastatin (ZOCOR) 40 MG tablet TAKE ONE TABLET BY MOUTH AT BEDTIME 90 tablet 1   tadalafil, PAH, (ALYQ) 20 MG tablet Take 2 tablets every day by oral route.     topiramate (TOPAMAX) 25 MG tablet Take 25 mg by mouth 2 (two) times daily.     warfarin (COUMADIN) 3 MG tablet Take 3 mg by mouth daily.     No facility-administered medications prior to visit.    Allergies  Allergen Reactions   Amoxicillin-Pot Clavulanate Other (See Comments)   Clarithromycin Other (  See Comments)    Stomach problems   Erythromycin    Lisinopril Swelling   Amoxicillin Diarrhea    ROS Review of Systems    Objective:    Physical Exam  BP 119/76   Pulse 93   Ht 5\' 4"  (1.626 m)   Wt 144 lb (65.3 kg)   SpO2 97%   BMI 24.72 kg/m  Wt Readings from Last 3 Encounters:  02/24/23 144 lb (65.3 kg)  02/15/23 148 lb (67.1 kg)  02/12/23 152 lb 1.9 oz (69 kg)    Lab Results  Component Value Date   TSH 0.860 01/04/2023   Lab Results  Component Value Date   WBC 4.3 02/15/2023   HGB 11.9 (L)  02/15/2023   HCT 34.5 (L) 02/15/2023   MCV 100.6 (H) 02/15/2023   PLT 159 02/15/2023   Lab Results  Component Value Date   NA 136 02/15/2023   K 3.1 (L) 02/15/2023   CO2 25 02/15/2023   GLUCOSE 105 (H) 02/15/2023   BUN 12 02/15/2023   CREATININE 1.02 (H) 02/15/2023   BILITOT 0.7 02/15/2023   ALKPHOS 50 02/15/2023   AST 19 02/15/2023   ALT 16 02/15/2023   PROT 7.1 02/15/2023   ALBUMIN 3.8 02/15/2023   CALCIUM 9.0 02/15/2023   ANIONGAP 12 02/15/2023   EGFR 59 (L) 01/12/2023   Lab Results  Component Value Date   CHOL 146 06/27/2022   Lab Results  Component Value Date   HDL 65 06/27/2022   Lab Results  Component Value Date   LDLCALC 62 06/27/2022   Lab Results  Component Value Date   TRIG 102 06/27/2022   Lab Results  Component Value Date   CHOLHDL 2.2 06/27/2022   Lab Results  Component Value Date   HGBA1C 5.2 06/27/2022      Assessment & Plan:  There are no diagnoses linked to this encounter.  Follow-up: No follow-ups on file.   Gilmore Laroche, FNP

## 2023-02-24 NOTE — Patient Instructions (Addendum)
I appreciate the opportunity to provide care to you today!    Follow up:  1 week   Labs: please stop by the lab today to get your blood drawn (CBC, CMP, TSH, Lipid profile, HgA1c, Vit D)  Abdominal Pain Management:  I have ordered an ultrasound of your abdomen to further evaluate your symptoms. Your imaging study is scheduled at [facility] in [place] at 3:30 PM today. Please do not eat anything until your imaging studies are completed. You may drink water with your medication intake but should remain NPO (nothing by mouth) otherwise.   Sleep disturbance -Please take hydroxyzine 25 mg at bedtime to help you sleep.  Non-Pharmacological Management for Sleep Hygiene:  Establish a Consistent Bedtime Routine: -Develop and adhere to a regular sleep and wake schedule. -Avoid using electronic devices, including computers and smartphones, at least one hour before bedtime. -If unable to fall asleep within 15 minutes, refrain from staying in bed and engage in a relaxing activity until you feel sleepy. Reduce Daily Stress: -Engage in stress-reducing activities before bedtime to help relax your mind and body. Avoid intense physical exercise and stimulant use, such as caffeine, late in the day. Optimize Sleep Environment: -Use the bed and bedroom exclusively for sleep and intimate activities. -Consider removing electronic devices from the sleeping area and limit screen time prior to bedtime. Incorporate Relaxation Techniques: -Practice abdominal breathing and meditation to promote relaxation. Utilize progressive muscle relaxation and visualization techniques to aid in achieving restful sleep.       Referrals today-  Home Health for home heath aid  Attached with your AVS, you will find valuable resources for self-education. I highly recommend dedicating some time to thoroughly examine them.   Please continue to a heart-healthy diet and increase your physical activities. Try to  exercise for at least five days a week.    It was a pleasure to see you and I look forward to continuing to work together on your health and well-being. Please do not hesitate to call the office if you need care or have questions about your care.  In case of emergency, please visit the Emergency Department for urgent care, or contact our clinic at [phone] to schedule an appointment. We're here to help you!   Have a wonderful day and week. With Gratitude, Gilmore Laroche MSN, FNP-BC

## 2023-02-25 ENCOUNTER — Encounter: Payer: Self-pay | Admitting: Hematology

## 2023-02-26 ENCOUNTER — Encounter: Payer: Self-pay | Admitting: Hematology

## 2023-02-26 DIAGNOSIS — G479 Sleep disorder, unspecified: Secondary | ICD-10-CM | POA: Insufficient documentation

## 2023-02-26 LAB — CMP14+EGFR
ALT: 13 [IU]/L (ref 0–32)
AST: 23 [IU]/L (ref 0–40)
Albumin: 4.4 g/dL (ref 3.8–4.8)
Alkaline Phosphatase: 59 [IU]/L (ref 44–121)
BUN/Creatinine Ratio: 16 (ref 12–28)
BUN: 16 mg/dL (ref 8–27)
Bilirubin Total: 0.4 mg/dL (ref 0.0–1.2)
CO2: 23 mmol/L (ref 20–29)
Calcium: 8.6 mg/dL — ABNORMAL LOW (ref 8.7–10.3)
Chloride: 98 mmol/L (ref 96–106)
Creatinine, Ser: 1.02 mg/dL — ABNORMAL HIGH (ref 0.57–1.00)
Globulin, Total: 2.3 g/dL (ref 1.5–4.5)
Glucose: 107 mg/dL — ABNORMAL HIGH (ref 70–99)
Potassium: 2.7 mmol/L — ABNORMAL LOW (ref 3.5–5.2)
Sodium: 139 mmol/L (ref 134–144)
Total Protein: 6.7 g/dL (ref 6.0–8.5)
eGFR: 58 mL/min/{1.73_m2} — ABNORMAL LOW (ref >59)

## 2023-02-26 LAB — CBC WITH DIFFERENTIAL/PLATELET
Basos: 3 %
EOS (ABSOLUTE): 0.1 10*3/uL (ref 0.0–0.2)
Eos: 1 %
Hematocrit: 37.7 % (ref 34.0–46.6)
Hemoglobin: 12.4 g/dL (ref 11.1–15.9)
Immature Granulocytes: 0 %
Immature Granulocytes: 0 10*3/uL (ref 0.0–0.1)
Lymphs: 23 %
MCH: 34.1 pg — ABNORMAL HIGH (ref 26.6–33.0)
MCHC: 32.9 g/dL (ref 31.5–35.7)
MCV: 104 fL — ABNORMAL HIGH (ref 79–97)
Monocytes Absolute: 0.1 10*3/uL (ref 0.0–0.4)
Monocytes Absolute: 1 10*3/uL — ABNORMAL HIGH (ref 0.1–0.9)
Monocytes: 22 %
Neutrophils Absolute: 1 10*3/uL (ref 0.7–3.1)
Neutrophils Absolute: 2.3 10*3/uL (ref 1.4–7.0)
Neutrophils: 51 %
Platelets: 207 10*3/uL (ref 150–450)
RBC: 3.64 x10E6/uL — ABNORMAL LOW (ref 3.77–5.28)
RDW: 15.3 % (ref 11.7–15.4)
WBC: 4.5 10*3/uL (ref 3.4–10.8)

## 2023-02-26 LAB — HEMOGLOBIN A1C
Est. average glucose Bld gHb Est-mCnc: 114 mg/dL
Hgb A1c MFr Bld: 5.6 % (ref 4.8–5.6)

## 2023-02-26 LAB — VITAMIN D 25 HYDROXY (VIT D DEFICIENCY, FRACTURES): Vit D, 25-Hydroxy: 55.1 ng/mL (ref 30.0–100.0)

## 2023-02-26 LAB — TSH+FREE T4
Free T4: 1.91 ng/dL — ABNORMAL HIGH (ref 0.82–1.77)
TSH: 0.694 u[IU]/mL (ref 0.450–4.500)

## 2023-02-26 NOTE — Assessment & Plan Note (Signed)
A prescription for hydralazine 25 mg to be taken at bedtime has been provided to assist with sleep, and a follow-up visit has been scheduled. Proper sleep hygiene was reviewed with the patient, and she verbalized understanding.

## 2023-02-26 NOTE — Assessment & Plan Note (Signed)
A repeat imaging study will be obtained to ensure that the renal stone has not shifted, causing an obstruction and contributing to the patient's abdominal pain and distress. The patient is unable to provide a urinalysis today in the clinic to assess for hematuria and UTI. She is encouraged to rest, increase hydration, and take Tylenol as needed for pain relief. A referral for home health assistance will be placed today.

## 2023-02-27 NOTE — Progress Notes (Signed)
Please inform the patient that her recent imaging study shows no immediate concerns related to urinary obstruction or kidney function.

## 2023-02-28 ENCOUNTER — Telehealth: Payer: Self-pay | Admitting: Family Medicine

## 2023-02-28 LAB — URINALYSIS
Glucose, UA: NEGATIVE
Ketones, UA: NEGATIVE
Nitrite, UA: NEGATIVE
RBC, UA: NEGATIVE
Specific Gravity, UA: 1.01 (ref 1.005–1.030)
Urobilinogen, Ur: 0.2 mg/dL (ref 0.2–1.0)
pH, UA: >9 — ABNORMAL HIGH (ref 5.0–7.5)

## 2023-02-28 NOTE — Telephone Encounter (Signed)
Results reviewed with patient daugter

## 2023-02-28 NOTE — Telephone Encounter (Signed)
Patient daughter Gurney Maxin returning ultrasound results call [phone].

## 2023-03-01 NOTE — Telephone Encounter (Signed)
The patient's lab results indicate a trace of leukocytes with negative nitrates, suggesting the possibility of asymptomatic bacteriuria. I will withhold treatment at this time, as the patient does not exhibit symptoms such as urinary frequency, urgency, or pain with urination. Additionally, hematuria was not noted in the urinalysis. A urinary culture will be obtained for further assessment.

## 2023-03-02 ENCOUNTER — Inpatient Hospital Stay: Payer: Medicare Other | Attending: Hematology

## 2023-03-02 ENCOUNTER — Inpatient Hospital Stay: Payer: Medicare Other | Admitting: Hematology

## 2023-03-02 ENCOUNTER — Inpatient Hospital Stay: Payer: Medicare Other

## 2023-03-02 VITALS — BP 104/69 | HR 77 | Temp 97.6°F | Resp 18 | Wt 145.0 lb

## 2023-03-02 DIAGNOSIS — Z79624 Long term (current) use of inhibitors of nucleotide synthesis: Secondary | ICD-10-CM | POA: Diagnosis not present

## 2023-03-02 DIAGNOSIS — Z86711 Personal history of pulmonary embolism: Secondary | ICD-10-CM | POA: Diagnosis not present

## 2023-03-02 DIAGNOSIS — Z7982 Long term (current) use of aspirin: Secondary | ICD-10-CM | POA: Diagnosis not present

## 2023-03-02 DIAGNOSIS — Z7961 Long term (current) use of immunomodulator: Secondary | ICD-10-CM | POA: Diagnosis not present

## 2023-03-02 DIAGNOSIS — M25561 Pain in right knee: Secondary | ICD-10-CM | POA: Insufficient documentation

## 2023-03-02 DIAGNOSIS — G629 Polyneuropathy, unspecified: Secondary | ICD-10-CM | POA: Diagnosis not present

## 2023-03-02 DIAGNOSIS — Z7951 Long term (current) use of inhaled steroids: Secondary | ICD-10-CM | POA: Insufficient documentation

## 2023-03-02 DIAGNOSIS — C9000 Multiple myeloma not having achieved remission: Secondary | ICD-10-CM

## 2023-03-02 DIAGNOSIS — Z79899 Other long term (current) drug therapy: Secondary | ICD-10-CM | POA: Diagnosis not present

## 2023-03-02 LAB — CBC WITH DIFFERENTIAL/PLATELET
Abs Immature Granulocytes: 0.01 10*3/uL (ref 0.00–0.07)
Basophils Absolute: 0.1 10*3/uL (ref 0.0–0.1)
Basophils Relative: 3 %
Eosinophils Absolute: 0.4 10*3/uL (ref 0.0–0.5)
Eosinophils Relative: 8 %
HCT: 36.9 % (ref 36.0–46.0)
Hemoglobin: 12.4 g/dL (ref 12.0–15.0)
Immature Granulocytes: 0 %
Lymphocytes Relative: 20 %
Lymphs Abs: 0.9 10*3/uL (ref 0.7–4.0)
MCH: 34.3 pg — ABNORMAL HIGH (ref 26.0–34.0)
MCHC: 33.6 g/dL (ref 30.0–36.0)
MCV: 101.9 fL — ABNORMAL HIGH (ref 80.0–100.0)
Monocytes Absolute: 0.4 10*3/uL (ref 0.1–1.0)
Monocytes Relative: 8 %
Neutro Abs: 2.8 10*3/uL (ref 1.7–7.7)
Neutrophils Relative %: 61 %
Platelets: 143 10*3/uL — ABNORMAL LOW (ref 150–400)
RBC: 3.62 MIL/uL — ABNORMAL LOW (ref 3.87–5.11)
RDW: 16.3 % — ABNORMAL HIGH (ref 11.5–15.5)
WBC: 4.6 10*3/uL (ref 4.0–10.5)
nRBC: 0 % (ref 0.0–0.2)

## 2023-03-02 LAB — COMPREHENSIVE METABOLIC PANEL
ALT: 17 U/L (ref 0–44)
AST: 20 U/L (ref 15–41)
Albumin: 4 g/dL (ref 3.5–5.0)
Alkaline Phosphatase: 47 U/L (ref 38–126)
Anion gap: 11 (ref 5–15)
BUN: 14 mg/dL (ref 8–23)
CO2: 27 mmol/L (ref 22–32)
Calcium: 8.6 mg/dL — ABNORMAL LOW (ref 8.9–10.3)
Chloride: 95 mmol/L — ABNORMAL LOW (ref 98–111)
Creatinine, Ser: 0.94 mg/dL (ref 0.44–1.00)
GFR, Estimated: >60 mL/min (ref >60)
Glucose, Bld: 135 mg/dL — ABNORMAL HIGH (ref 70–99)
Potassium: 3.1 mmol/L — ABNORMAL LOW (ref 3.5–5.1)
Sodium: 133 mmol/L — ABNORMAL LOW (ref 135–145)
Total Bilirubin: 0.7 mg/dL (ref 0.3–1.2)
Total Protein: 6.9 g/dL (ref 6.5–8.1)

## 2023-03-02 LAB — MAGNESIUM: Magnesium: 2.4 mg/dL (ref 1.7–2.4)

## 2023-03-02 NOTE — Progress Notes (Signed)
No treatment today per provider.  See providers note.

## 2023-03-02 NOTE — Patient Instructions (Addendum)
[facility] at [facility] Discharge Instructions   You were seen and examined today by Dr. Ellin Saba.  He reviewed the results of your lab work which are normal/stable.   We will hold your Velcade injection today.   Dr. Kirtland Bouchard would also like for you to hold the Revlimid pills at home until he tells you to restart.   Dr. Kirtland Bouchard recommends you be evaluated by a psychiatrist.   Be sure to follow up with your primary care doctor to adjust your anti-anxiety medications.   We will see you back in 4 weeks to see how your are doing.    Thank you for choosing [facility] at [facility] to provide your oncology and hematology care.  To afford each patient quality time with our provider, please arrive at least 15 minutes before your scheduled appointment time.   If you have a lab appointment with the Cancer Center please come in thru the Main Entrance and check in at the main information desk.  You need to re-schedule your appointment should you arrive 10 or more minutes late.  We strive to give you quality time with our providers, and arriving late affects you and other patients whose appointments are after yours.  Also, if you no show three or more times for appointments you may be dismissed from the clinic at the providers discretion.     Again, thank you for choosing [facility].  Our hope is that these requests will decrease the amount of time that you wait before being seen by our physicians.       _____________________________________________________________  Should you have questions after your visit to [facility], please contact our office at [phone] and follow the prompts.  Our office hours are 8:00 a.m. and 4:30 p.m. Monday - Friday.  Please note that voicemails left after 4:00 p.m. may not be returned until the following business day.  We are closed weekends and major holidays.  You do have access to a nurse 24-7, just  call the main number to the clinic [phone] and do not press any options, hold on the line and a nurse will answer the phone.    For prescription refill requests, have your pharmacy contact our office and allow 72 hours.    Due to Covid, you will need to wear a mask upon entering the hospital. If you do not have a mask, a mask will be given to you at the Main Entrance upon arrival. For doctor visits, patients may have 1 support person age 23 or older with them. For treatment visits, patients can not have anyone with them due to social distancing guidelines and our immunocompromised population.

## 2023-03-02 NOTE — Progress Notes (Signed)
[facility] [address] [place], [place] [address]    Clinic Day:  03/02/2023  Referring physician: Gilmore Laroche, FNP  Patient Care Team: Gilmore Laroche, FNP as PCP - General (Family Medicine) Doreatha Massed, MD as Medical Oncologist (Oncology) Mickie Bail, RN as Oncology Nurse Navigator (Oncology) Lanelle Bal, DO as Consulting Physician (Internal Medicine) Randa Spike, Kelton Pillar, LCSW as [facility] Care Management (Licensed Clinical Social Worker)   ASSESSMENT & PLAN:   Assessment: 1.  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  Increased number of atypical plasma cells present 36% of all cell lines.  Plasma cells are kappa light chain restricted. -Unfortunately her specimen has reached cytogenetics and FISH lab week later due to bad weather and no viable plasma cells. -PET scan on 07/09/2019 showed no findings of active myeloma. -24-hour urine shows total protein 59 mg.  Urine immunofixation shows IgA kappa monoclonal protein.  LDH is 184. -Based on [facility] criteria she has high risk with about 45-50% probability of progression to myeloma in the next 2 years. -CTAP on 01/07/2020 shows acute superior endplate burst fracture of L1.  Mild associated paravertebral soft tissue thickening.  No other bone abnormalities. -Bone density test on 02/07/2020 shows T score -0.5. - Bone marrow biopsy on 07/12/2021: Hypercellular with increased number of atypical plasma cells representing 30% of cells, displaying kappa light chain restriction. - FISH panel: Gain of 1 q. (high risk), T p53 deletion (high risk), monosomy 13 (standard risk) - Cytogenetics: Complex karyotype. - PET scan (06/24/2021): New hypermetabolic bone lesions involving left anterior iliac crest, left mid humerus, right superior pubic ramus, bilateral mid femurs, left calvarium.  Largest lytic lesion  in the left iliac crest measuring 2.9 x 2.3 cm. - 2D echo on 07/29/2021: EF 50-55%.  There is distal septal and inferior apical hypokinesis.  (Carfilzomib based regimen was put on hold due to echo findings) - Dara VRD 6 cycles from 08/10/2021 through 11/30/2021 - Myeloma panel 12/07/2021, DIRA test was negative, M spike 0.2 g and light chain ratio normal. - Maintenance Velcade every 2 weeks and Revlimid 3 weeks on 1 week off Started on 12/20/2021    2.  Social/family history: - She uses walker to ambulate since her right knee replacement leading to stiffness.  She lives with older sister and husband who has dementia. - She is independent of ADLs and IADLs.  She also drives     Plan: 1.  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 - She is very depressed and tearful. - Her daughter reports that patient has been anxious for last 3 weeks with restlessness and inability to sleep. - Review of medications showed that she is on Zoloft and BuSpar which was recently started. - Reviewed labs today: Normal creatinine, LFTs.  Sodium 133.  CBC grossly normal. - Myeloma labs from 02/15/2023: M spike 0.3 g.  Free light chain ratio is normal at 0.63.  Immunofixation shows IgA kappa. - I will hold her Velcade.  I have told her to hold Revlimid.  I will reevaluate her in 4 weeks with repeat myeloma labs. - We have made a referral to psychiatry.  I have also recommended that she follow-up with her PMD.   2.  Peripheral neuropathy: - She has on and off numbness in the fingertips and toes and occasionally  drops small objects.   3.  Pulmonary embolism (2015): - Continue Coumadin indefinitely.  No bleeding issues.  4.  ID prophylaxis: - Continue acyclovir twice daily.  5.  Myeloma bone disease: - Denosumab restarted back on 12/22/2022 after dental work done.  Today we will hold denosumab.  6.  Right knee pain: - Continue hydrocodone 5/325 daily as  needed.    Orders Placed This Encounter  Procedures   Kappa/lambda light chains    Standing Status:   Future    Standing Expiration Date:   03/01/2024   Protein electrophoresis, serum    Standing Status:   Future    Standing Expiration Date:   03/01/2024     Mikeal Hawthorne R Teague,acting as a scribe for Doreatha Massed, MD.,have documented all relevant documentation on the behalf of Doreatha Massed, MD,as directed by  Doreatha Massed, MD while in the presence of Doreatha Massed, MD.  I, Doreatha Massed MD, have reviewed the above documentation for accuracy and completeness, and I agree with the above.     Doreatha Massed, MD   10/10/20245:35 PM  CHIEF COMPLAINT:   Diagnosis: multiple myeloma    Cancer Staging  No matching staging information was found for the patient.    Prior Therapy: Dara VRD 6 cycles completed on 11/30/2021   Current Therapy:  Maintenance Velcade every 2 weeks, and Revlimid 3 weeks on/1 week off    HISTORY OF PRESENT ILLNESS:   Oncology History  IgA myeloma (HCC)  05/06/2021 Initial Diagnosis   IgA myeloma (HCC)   08/10/2021 - 01/19/2022 Chemotherapy   Patient is on Treatment Plan : MYELOMA NEWLY DIAGNOSED TRANSPLANT CANDIDATE DaraVRd (Daratumumab SQ) q21d x 6 Cycles (Induction/Consolidation)     08/10/2021 -  Chemotherapy   Patient is on Treatment Plan : MYELOMA NEWLY DIAGNOSED TRANSPLANT CANDIDATE Velcade D1 and D 15 q 28 days        INTERVAL HISTORY:   Aleina is a 72 y.o. female presenting to clinic today for follow up of multiple myeloma. She was last seen by me on 12/22/22.  Since her last visit, she was seen in the ED on 01/08/23 for hypokalemia and 01/15/23 for hypoglycemia with disorientation. She also was seen at the ED on 02/12/23 for acute cystitis. I independently viewed and interpreted CT A/P that showed no obstructing stone with a small stone in the right renal pelvis and no acute findings. She was discharged with  Keflex 500 mg BID.   Today, she states that she is doing well overall. Her appetite level is at 0%. Her energy level is at 0%. She is accompanied by her daughter.   She c/o generalized weakness throughout the body for the past week. Her daughter states the patient originally said her anxiety has been elevated for the last 3 weeks, but cannot identify the cause. Her daughter reports she has decreased appetite, as well as restlessness with difficulty sleeping. Her daughter states she has a delayed response to questions. She has repeatedly said she feels like she wants to die at this visit. She denies any recent falls or lightheadedness. She has recently been prescribed hydroxyzine by her PCP, Zoloft on 12/19/22, and Buspar on 01/31/23. She is taking all medications as prescribed.   For the last 3 weeks, she has been lying in bed and will not move unless prompted by her daughter or family members. She is living with her father and sister-in-law. She has had weekly home health for the last 2 weeks and weekly physical  therapy for 1 week.   PAST MEDICAL HISTORY:   Past Medical History: Past Medical History:  Diagnosis Date   Acute myocardial infarction [facility]) 2009   CAD/no stent medically managed   Anxiety disorder    CAD (coronary artery disease)    Cancer (HCC)    multiple myeloma   Hyperlipidemia    Hypertension    IBS (irritable bowel syndrome)    Non-ulcer dyspepsia 04/30/2009   Qualifier: Diagnosis of  By: Darrick Penna MD, Sandi L    Overactive bladder    PE (pulmonary embolism) 10/2012   left lung   Presence of permanent cardiac pacemaker    Sleep apnea     Surgical History: Past Surgical History:  Procedure Laterality Date   ABDOMINAL HYSTERECTOMY     BSO secondary to cyst     CARDIAC CATHETERIZATION     COLONOSCOPY  12/2009   Dr. Aleene Davidson, propofol, normal. Next TCS 12/2019   COLONOSCOPY N/A 05/22/2017   Examined portion ileum was normal, significant looping of colon, external  hemorrhoids and rectal bleeding due to internal hemorrhoids, mild diverticulosis procedure: COLONOSCOPY;  Surgeon: West Bali, MD;  [facility];  Service: Endoscopy;  Laterality: N/A;  10:30am   ESOPHAGOGASTRODUODENOSCOPY  05/11/2009   schatzki ring/small hiatal hernia/path:gastritis   ESOPHAGOGASTRODUODENOSCOPY N/A 05/22/2017   Multiple gastric polyps with biopsy benign fundic gland polyp, small bowel biopsy negative for celiac, gastric biopsy with mild gastritis but no H. pylori procedure: ESOPHAGOGASTRODUODENOSCOPY (EGD);  Surgeon: West Bali, MD;  [facility]: [facility];  Service: Endoscopy;  Laterality: N/A;   ESOPHAGOGASTRODUODENOSCOPY (EGD) WITH ESOPHAGEAL DILATION N/A 04/01/2013   [id number] at the gastroesophageal juction/multiple small polyps/mild gastritis   GIVENS CAPSULE STUDY N/A 06/07/2017   Frequent gastric erosions, normal small bowel mucosa.  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    Social History: Social History   Socioeconomic History   Marital status: Married    Spouse name: Doctor, general practice   Number of children: 3   Years of education: 16   Highest education level: Not on file  Occupational History   Not on file  Tobacco Use   Smoking status: Never    Passive exposure: Never   Smokeless tobacco: Never   Tobacco comments:    Never smoked   Vaping Use   Vaping status: Never Used  Substance and Sexual Activity   Alcohol use: Not Currently    Comment: occ wine   Drug use: No   Sexual activity: Not Currently  Other Topics Concern   Not on file  Social History Narrative      Lives with husband-51 years 952 in Aug 2021   Daughter is close by, 2 sons live further away   One IN [place],ONE IN [place], ONE BESIDE HER.         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   Enjoys: reading, young adult      Diet: eats all food groups    Caffeine: coffee 1, tea daily soda-1 daily   Water: 1-2 cups      Wears seat belt    Does not use phone while driving   Smoke detectors at home    [employer] -locked up  Left handed   One story home   Drinks caffeine   Social Determinants of Health   Financial Resource Strain: Low Risk  (12/21/2022)   Overall Financial Resource Strain (CARDIA)    Difficulty of Paying Living Expenses: Not hard at all  Food Insecurity: No Food Insecurity (12/21/2022)   Hunger Vital Sign    Worried About Running Out of Food in the Last Year: Never true    Ran Out of Food in the Last Year: Never true  Transportation Needs: No Transportation Needs (12/21/2022)   PRAPARE - Administrator, Civil Service (Medical): No    Lack of Transportation (Non-Medical): No  Physical Activity: Inactive (12/21/2022)   Exercise Vital Sign    Days of Exercise per Week: 0 days    Minutes of Exercise per Session: 0 min  Stress: Stress Concern Present (12/21/2022)   [employer] of Occupational Health - Occupational Stress Questionnaire    Feeling of Stress : To some extent  Social Connections: Moderately Integrated (12/21/2022)   Social Connection and Isolation Panel [NHANES]    Frequency of Communication with Friends and Family: More than three times a week    Frequency of Social Gatherings with Friends and Family: More than three times a week    Attends Religious Services: More than 4 times per year    Active Member of [employer] or Organizations: No    Attends Banker Meetings: Never    Marital Status: Married  Catering manager Violence: Not At Risk (12/21/2022)   Humiliation, Afraid, Rape, and Kick questionnaire    Fear of Current or Ex-Partner: No    Emotionally Abused: No    Physically Abused: No    Sexually Abused: No    Family History: Family  History  Problem Relation Age of Onset   Colon polyps Neg Hx    Colon cancer Neg Hx     Current Medications:  Current Outpatient Medications:    acetaminophen (TYLENOL) 500 MG tablet, Take 1,000 mg by mouth 2 (two) times daily as needed for moderate pain or headache., Disp: , Rfl:    acyclovir (ZOVIRAX) 400 MG tablet, TAKE ONE TABLET BY MOUTH TWICE DAILY, Disp: 60 tablet, Rfl: 6   amLODipine (NORVASC) 10 MG tablet, TAKE ONE TABLET BY MOUTH ONCE DAILY, Disp: 90 tablet, Rfl: 0   ascorbic acid (VITAMIN C) 500 MG tablet, Take 500 mg by mouth daily., Disp: , Rfl:    aspirin EC 81 MG tablet, Take 81 mg by mouth at bedtime., Disp: , Rfl:    B Complex Vitamins (VITAMIN B COMPLEX) TABS, Take 1 tablet by mouth daily., Disp: , Rfl:    B Complex-Folic Acid (B COMPLEX VITAMINS, W/ FA,) CAPS, Take 1 capsule by mouth daily., Disp: , Rfl:    bortezomib SQ (VELCADE) 3.5 MG injection, Inject into the skin once., Disp: , Rfl:    busPIRone (BUSPAR) 5 MG tablet, Take 5 mg by mouth daily., Disp: , Rfl:    Calcium Carbonate-Vitamin D (CALCIUM 600+D PO), Take 1 tablet by mouth 2 (two) times daily., Disp: , Rfl:    chlorthalidone (HYGROTON) 25 MG tablet, Take 25 mg by mouth daily., Disp: , Rfl:    cholecalciferol (VITAMIN D3) 10 MCG (400 UNIT) TABS tablet, Take 400 Units by mouth daily., Disp: , Rfl:    cyanocobalamin (VITAMIN B12) 1000 MCG tablet, Take by mouth., Disp: , Rfl:    diclofenac Sodium (VOLTAREN) 1 % GEL, Apply 4 g topically 4 (four) times  daily., Disp: 50 g, Rfl: 0   fluticasone (FLONASE) 50 MCG/ACT nasal spray, USE 2 SPRAYS IN EACH NOSTRIL ONCE DAILY, Disp: 16 g, Rfl: 0   fluticasone-salmeterol (ADVAIR) 250-50 MCG/ACT AEPB, Inhale into the lungs., Disp: , Rfl:    folic acid (FOLVITE) 1 MG tablet, TAKE 1 TABLET BY MOUTH ONCE DAILY., Disp: 30 tablet, Rfl: 11   gabapentin (NEURONTIN) 100 MG capsule, Take 100 mg by mouth 3 (three) times daily., Disp: , Rfl:    HYDROcodone-acetaminophen (NORCO/VICODIN)  5-325 MG tablet, TAKE ONE TABLET BY MOUTH EVERY 8 HOURS AS NEEDED FOR MODERATE PAIN, Disp: 60 tablet, Rfl: 0   hydrOXYzine (VISTARIL) 25 MG capsule, Take 1 capsule (25 mg total) by mouth at bedtime as needed., Disp: 30 capsule, Rfl: 0   Hyoscyamine Sulfate SL 0.125 MG SUBL, , Disp: , Rfl:    lenalidomide (REVLIMID) 10 MG capsule, Take 1 capsule (10 mg total) by mouth daily. 21 days on, 7 days off every 28 days, Disp: 21 capsule, Rfl: 0   levocetirizine (XYZAL) 5 MG tablet, TAKE ONE TABLET BY MOUTH IN THE EVENING, Disp: 30 tablet, Rfl: 1   lidocaine (HM LIDOCAINE PATCH) 4 %, Place 1 patch onto the skin daily., Disp: 14 patch, Rfl: 0   losartan (COZAAR) 100 MG tablet, TAKE ONE TABLET BY MOUTH ONCE DAILY, Disp: 30 tablet, Rfl: 3   magnesium oxide (MAG-OX) 400 (240 Mg) MG tablet, Take 1 tablet (400 mg total) by mouth 2 (two) times daily., Disp: 60 tablet, Rfl: 4   metoprolol tartrate (LOPRESSOR) 50 MG tablet, Take 1 tablet (50 mg total) by mouth 2 (two) times daily., Disp: 180 tablet, Rfl: 1   montelukast (SINGULAIR) 10 MG tablet, TAKE ONE TABLET BY MOUTH AT BEDTIME, Disp: 30 tablet, Rfl: 3   MYRBETRIQ 50 MG TB24 tablet, TAKE ONE TABLET BY MOUTH ONCE DAILY, Disp: 30 tablet, Rfl: 3   pantoprazole (PROTONIX) 40 MG tablet, Take 1 tablet (40 mg total) by mouth 2 (two) times daily., Disp: 90 tablet, Rfl: 1   potassium chloride SA (KLOR-CON M) 20 MEQ tablet, Take 1 tablet (20 mEq total) by mouth 2 (two) times daily., Disp: , Rfl:    primidone (MYSOLINE) 50 MG tablet, Take by mouth., Disp: , Rfl:    Probiotic Product (GNP PROBIOTIC COLON SUPPORT) CAPS, Take 1 capsule by mouth daily., Disp: , Rfl:    Saccharomyces boulardii (PROBIOTIC) 250 MG CAPS, Take 1 capsule by mouth daily., Disp: , Rfl:    sertraline (ZOLOFT) 25 MG tablet, Take 1 tablet (25 mg total) by mouth daily., Disp: 30 tablet, Rfl: 3   simvastatin (ZOCOR) 40 MG tablet, TAKE ONE TABLET BY MOUTH AT BEDTIME, Disp: 90 tablet, Rfl: 1   tadalafil, PAH,  (ALYQ) 20 MG tablet, Take 2 tablets every day by oral route., Disp: , Rfl:    topiramate (TOPAMAX) 25 MG tablet, Take 25 mg by mouth 2 (two) times daily., Disp: , Rfl:    warfarin (COUMADIN) 3 MG tablet, Take 3 mg by mouth daily., Disp: , Rfl:    nitroGLYCERIN (NITROSTAT) 0.4 MG SL tablet, Place 0.4 mg under the tongue every 5 (five) minutes as needed for chest pain. (Patient not taking: Reported on 03/02/2023), Disp: , Rfl:    Allergies: Allergies  Allergen Reactions   Amoxicillin-Pot Clavulanate Other (See Comments)   Clarithromycin Other (See Comments)    Stomach problems   Erythromycin    Lisinopril Swelling   Amoxicillin Diarrhea    REVIEW OF SYSTEMS:  Review of Systems  Constitutional:  Negative for chills, fatigue and fever.  HENT:   Negative for lump/mass, mouth sores, nosebleeds, sore throat and trouble swallowing.   Eyes:  Negative for eye problems.  Respiratory:  Negative for cough and shortness of breath.   Cardiovascular:  Negative for chest pain, leg swelling and palpitations.  Gastrointestinal:  Positive for constipation. Negative for abdominal pain, diarrhea, nausea and vomiting.  Genitourinary:  Negative for bladder incontinence, difficulty urinating, dysuria, frequency, hematuria and nocturia.   Musculoskeletal:  Negative for arthralgias, back pain, flank pain, myalgias and neck pain.  Skin:  Negative for itching and rash.  Neurological:  Negative for dizziness, headaches and numbness.  Hematological:  Does not bruise/bleed easily.  Psychiatric/Behavioral:  Positive for depression and sleep disturbance. Negative for suicidal ideas. The patient is nervous/anxious.   All other systems reviewed and are negative.    VITALS:   Blood pressure 104/69, pulse 77, temperature 97.6 F (36.4 C), temperature source Oral, resp. rate 18, weight 145 lb (65.8 kg), SpO2 100%.  Wt Readings from Last 3 Encounters:  03/02/23 145 lb (65.8 kg)  02/24/23 144 lb (65.3 kg)  02/15/23  148 lb (67.1 kg)    Body mass index is 24.89 kg/m.  Performance status (ECOG): 1 - Symptomatic but completely ambulatory  PHYSICAL EXAM:   Physical Exam Vitals and nursing note reviewed. Exam conducted with a chaperone present.  Constitutional:      Appearance: Normal appearance.  Cardiovascular:     Rate and Rhythm: Normal rate and regular rhythm.     Pulses: Normal pulses.     Heart sounds: Normal heart sounds.  Pulmonary:     Effort: Pulmonary effort is normal.     Breath sounds: Normal breath sounds.  Abdominal:     Palpations: Abdomen is soft. There is no hepatomegaly, splenomegaly or mass.     Tenderness: There is no abdominal tenderness.  Musculoskeletal:     Right lower leg: No edema.     Left lower leg: No edema.  Lymphadenopathy:     Cervical: No cervical adenopathy.     Right cervical: No superficial, deep or posterior cervical adenopathy.    Left cervical: No superficial, deep or posterior cervical adenopathy.     Upper Body:     Right upper body: No supraclavicular or axillary adenopathy.     Left upper body: No supraclavicular or axillary adenopathy.  Neurological:     General: No focal deficit present.     Mental Status: She is alert and oriented to person, place, and time.  Psychiatric:        Mood and Affect: Mood normal.        Behavior: Behavior normal.     LABS:      Latest Ref Rng & Units 03/02/2023   10:32 AM 02/24/2023   11:21 AM 02/15/2023    1:04 PM  CBC  WBC 4.0 - 10.5 K/uL 4.6  4.5  4.3   Hemoglobin 12.0 - 15.0 g/dL 11.9  14.7  82.9   Hematocrit 36.0 - 46.0 % 36.9  37.7  34.5   Platelets 150 - 400 K/uL 143  207  159       Latest Ref Rng & Units 03/02/2023   10:32 AM 02/24/2023   11:21 AM 02/15/2023    1:04 PM  CMP  Glucose 70 - 99 mg/dL 562  130  865   BUN 8 - 23 mg/dL 14  16  12    Creatinine  0.44 - 1.00 mg/dL 6.04  5.40  9.81   Sodium 135 - 145 mmol/L 133  139  136   Potassium 3.5 - 5.1 mmol/L 3.1  2.7  3.1   Chloride 98 - 111  mmol/L 95  98  99   CO2 22 - 32 mmol/L 27  23  25    Calcium 8.9 - 10.3 mg/dL 8.6  8.6  9.0   Total Protein 6.5 - 8.1 g/dL 6.9  6.7  7.1   Total Bilirubin 0.3 - 1.2 mg/dL 0.7  0.4  0.7   Alkaline Phos 38 - 126 U/L 47  59  50   AST 15 - 41 U/L 20  23  19    ALT 0 - 44 U/L 17  13  16       No results found for: "CEA1", "CEA" / No results found for: "CEA1", "CEA" No results found for: "PSA1" No results found for: "CAN199" No results found for: "CAN125"  Lab Results  Component Value Date   TOTALPROTELP 6.3 02/15/2023   TOTALPROTELP 6.3 02/15/2023   ALBUMINELP 3.5 02/15/2023   A1GS 0.4 02/15/2023   A2GS 0.9 02/15/2023   BETS 1.0 02/15/2023   GAMS 0.5 02/15/2023   MSPIKE 0.3 (H) 02/15/2023   SPEI Comment 02/15/2023   Lab Results  Component Value Date   TIBC 291 06/09/2021   TIBC 297 01/21/2021   TIBC 302 10/21/2020   FERRITIN 279 06/09/2021   FERRITIN 216 01/21/2021   FERRITIN 274 10/21/2020   IRONPCTSAT 24 06/09/2021   IRONPCTSAT 19 01/21/2021   IRONPCTSAT 28 10/21/2020   Lab Results  Component Value Date   LDH 129 03/02/2022   LDH 121 12/14/2021   LDH 113 11/09/2021     STUDIES:   US Renal  Result Date: 02/24/2023 CLINICAL DATA:  Right renal stone follow-up EXAM: RENAL / URINARY TRACT ULTRASOUND COMPLETE COMPARISON:  CT abdomen pelvis 02/12/2023 FINDINGS: Right Kidney: Renal measurements: 9.7 x 4.2 x 3.9 cm = volume: 83.1 mL. Normal renal cortical thickness and echogenicity. No hydronephrosis. There is a 3.9 cm simple cyst. No imaging follow-up needed. The stone is not visualized. Left Kidney: Renal measurements: 9.8 x 4.3 x 4.4 cm = volume: 97.0 mL. Echogenicity within normal limits. No mass or hydronephrosis visualized. Bladder: Appears normal for degree of bladder distention. Other: None. IMPRESSION: No hydronephrosis. Electronically Signed   By: Annia Belt M.D.   On: 02/24/2023 23:10   CT ABDOMEN PELVIS W CONTRAST  Result Date: 02/12/2023 CLINICAL DATA:  Abdominal  pain with dysuria 3 days. EXAM: CT ABDOMEN AND PELVIS WITH CONTRAST TECHNIQUE: Multidetector CT imaging of the abdomen and pelvis was performed using the standard protocol following bolus administration of intravenous contrast. RADIATION DOSE REDUCTION: This exam was performed according to the departmental dose-optimization program which includes automated exposure control, adjustment of the mA and/or kV according to patient size and/or use of iterative reconstruction technique. CONTRAST:  OMNIPAQUE IOHEXOL 300 MG/ML  SOLN COMPARISON:  03/01/2021 FINDINGS: Lower chest: Borderline cardiomegaly with cardiac pacer leads present. Chronic changes in the lung bases. No acute airspace process or effusion. Hepatobiliary: Liver, gallbladder and biliary tree are normal. Pancreas: Normal. Spleen: Normal. Adrenals/Urinary Tract: Adrenal glands are normal. Kidneys are normal in size. 3-4 mm nonobstructing stone over the lower pole right kidney unchanged. No significant change in a 3.7 cm simple cyst over the lower pole right kidney. Ureters and bladder are normal. Stomach/Bowel: Stomach and small bowel are normal. Appendix is not well visualized.  Minimal diverticulosis of the colon. Moderate fecal retention over the rectum. Vascular/Lymphatic: Abdominal aorta is normal in caliber. Remaining vascular structures are unremarkable. No adenopathy. Reproductive: Previous hysterectomy. Adnexal regions are unremarkable. Other: No free fluid or focal inflammatory change. Musculoskeletal: L1 compression fracture post kyphoplasty unchanged. No acute findings. IMPRESSION: 1. No acute findings in the abdomen/pelvis. 2. 3-4 mm nonobstructing right renal stone. Stable 3.7 cm simple right renal cyst. No follow-up imaging is recommended. 3. Minimal diverticulosis of the colon. Moderate fecal retention over the rectum. 4. Borderline cardiomegaly. Electronically Signed   By: Elberta Fortis M.D.   On: 02/12/2023 16:19   DG Chest Portable 1  View  Result Date: 02/12/2023 CLINICAL DATA:  weakness.  Abdominal pain for 3 days. EXAM: PORTABLE CHEST 1 VIEW COMPARISON:  11/14/2022. FINDINGS: Patchy areas of scarring/fibrosis again noted in the right superior hilar region and right lung base region. Bilateral lung fields are otherwise clear. No acute consolidation or major lung collapse. Bilateral lateral costophrenic angles are clear. Normal cardio-mediastinal silhouette. There is a left sided 2-lead pacemaker. No acute osseous abnormalities. The soft tissues are within normal limits. No free air under the domes of diaphragm. IMPRESSION: No active disease. Electronically Signed   By: Jules Schick M.D.   On: 02/12/2023 15:03

## 2023-03-03 ENCOUNTER — Emergency Department (HOSPITAL_COMMUNITY)
Admission: EM | Admit: 2023-03-03 | Discharge: 2023-03-04 | Disposition: A | Discharge status: Home / Self Care | Payer: Medicare Other | Attending: Emergency Medicine | Admitting: Emergency Medicine

## 2023-03-03 ENCOUNTER — Encounter (HOSPITAL_COMMUNITY): Payer: Self-pay

## 2023-03-03 ENCOUNTER — Telehealth: Payer: Self-pay | Admitting: Family Medicine

## 2023-03-03 ENCOUNTER — Other Ambulatory Visit: Payer: Self-pay

## 2023-03-03 DIAGNOSIS — E876 Hypokalemia: Secondary | ICD-10-CM | POA: Insufficient documentation

## 2023-03-03 DIAGNOSIS — I10 Essential (primary) hypertension: Secondary | ICD-10-CM | POA: Diagnosis not present

## 2023-03-03 DIAGNOSIS — Z79899 Other long term (current) drug therapy: Secondary | ICD-10-CM | POA: Diagnosis not present

## 2023-03-03 DIAGNOSIS — Z7982 Long term (current) use of aspirin: Secondary | ICD-10-CM | POA: Diagnosis not present

## 2023-03-03 LAB — CBC WITH DIFFERENTIAL/PLATELET
Abs Immature Granulocytes: 0.01 10*3/uL (ref 0.00–0.07)
Basophils Absolute: 0.1 10*3/uL (ref 0.0–0.1)
Basophils Relative: 1 %
Eosinophils Absolute: 0.3 10*3/uL (ref 0.0–0.5)
Eosinophils Relative: 8 %
HCT: 34.7 % — ABNORMAL LOW (ref 36.0–46.0)
Hemoglobin: 12 g/dL (ref 12.0–15.0)
Immature Granulocytes: 0 %
Lymphocytes Relative: 27 %
Lymphs Abs: 0.9 10*3/uL (ref 0.7–4.0)
MCH: 34.7 pg — ABNORMAL HIGH (ref 26.0–34.0)
MCHC: 34.6 g/dL (ref 30.0–36.0)
MCV: 100.3 fL — ABNORMAL HIGH (ref 80.0–100.0)
Monocytes Absolute: 0.4 10*3/uL (ref 0.1–1.0)
Monocytes Relative: 11 %
Neutro Abs: 1.8 10*3/uL (ref 1.7–7.7)
Neutrophils Relative %: 53 %
Platelets: 124 10*3/uL — ABNORMAL LOW (ref 150–400)
RBC: 3.46 MIL/uL — ABNORMAL LOW (ref 3.87–5.11)
RDW: 16.2 % — ABNORMAL HIGH (ref 11.5–15.5)
WBC: 3.5 10*3/uL — ABNORMAL LOW (ref 4.0–10.5)
nRBC: 0 % (ref 0.0–0.2)

## 2023-03-03 LAB — COMPREHENSIVE METABOLIC PANEL
ALT: 16 U/L (ref 0–44)
AST: 20 U/L (ref 15–41)
Albumin: 3.7 g/dL (ref 3.5–5.0)
Alkaline Phosphatase: 51 U/L (ref 38–126)
Anion gap: 7 (ref 5–15)
BUN: 14 mg/dL (ref 8–23)
CO2: 26 mmol/L (ref 22–32)
Calcium: 8.5 mg/dL — ABNORMAL LOW (ref 8.9–10.3)
Chloride: 98 mmol/L (ref 98–111)
Creatinine, Ser: 0.92 mg/dL (ref 0.44–1.00)
GFR, Estimated: >60 mL/min (ref >60)
Glucose, Bld: 131 mg/dL — ABNORMAL HIGH (ref 70–99)
Potassium: 2.9 mmol/L — ABNORMAL LOW (ref 3.5–5.1)
Sodium: 131 mmol/L — ABNORMAL LOW (ref 135–145)
Total Bilirubin: 0.7 mg/dL (ref 0.3–1.2)
Total Protein: 6.4 g/dL — ABNORMAL LOW (ref 6.5–8.1)

## 2023-03-03 LAB — MAGNESIUM: Magnesium: 2.2 mg/dL (ref 1.7–2.4)

## 2023-03-03 NOTE — ED Triage Notes (Signed)
PCP called pt and told to come to ED for Potassium for 2.7.   Complains of cramping all over.

## 2023-03-03 NOTE — Telephone Encounter (Signed)
I called the patient and advised her to go to the ED for urgent care due to her potassium level of 2.7. The patient reported that she will call her daughter for transportation to the ED.Report provided to the ED provider

## 2023-03-04 DIAGNOSIS — E876 Hypokalemia: Secondary | ICD-10-CM | POA: Diagnosis not present

## 2023-03-04 MED ORDER — HYDROCODONE-ACETAMINOPHEN 5-325 MG PO TABS
1.0000 | ORAL_TABLET | Freq: Once | ORAL | Status: AC
  Administered 2023-03-04: 1 via ORAL
  Filled 2023-03-04: qty 1

## 2023-03-04 MED ORDER — POTASSIUM CHLORIDE CRYS ER 20 MEQ PO TBCR
40.0000 meq | EXTENDED_RELEASE_TABLET | Freq: Once | ORAL | Status: AC
  Administered 2023-03-04: 40 meq via ORAL
  Filled 2023-03-04: qty 2

## 2023-03-04 MED ORDER — POTASSIUM CHLORIDE 10 MEQ/100ML IV SOLN
10.0000 meq | Freq: Once | INTRAVENOUS | Status: AC
  Administered 2023-03-04: 10 meq via INTRAVENOUS
  Filled 2023-03-04: qty 100

## 2023-03-04 NOTE — Discharge Instructions (Signed)
Increase your potassium to 20 mg twice daily for the next 5 days.  Follow-up next week with your primary doctor for a recheck metabolic panel.  Return to the ER if you experience any new and/or concerning issues.

## 2023-03-04 NOTE — ED Provider Notes (Signed)
[place] EMERGENCY DEPARTMENT AT [facility] Provider Note   CSN: [id number] Arrival date & time: 03/03/23  1916     History  Chief Complaint  Patient presents with   Abnormal Lab    Courtney Rogers is a 72 y.o. female.  Patient is a 72 year old female with past medical history of irritable bowel, GERD, anxiety, iron deficiency anemia, hypertension.  Patient presenting today for evaluation of low potassium.  She received a call from her primary care provider informing her that her potassium was 2.7 and that she needed to go to the ER for "urgent care".  She does complain of back pain from laying in the exam stretcher, but has no complaints otherwise.  The history is provided by the patient.       Home Medications Prior to Admission medications   Medication Sig Start Date End Date Taking? Authorizing Provider  acetaminophen (TYLENOL) 500 MG tablet Take 1,000 mg by mouth 2 (two) times daily as needed for moderate pain or headache.    [provider]  acyclovir (ZOVIRAX) 400 MG tablet TAKE ONE TABLET BY MOUTH TWICE DAILY 12/20/22   Doreatha Massed, MD  amLODipine (NORVASC) 10 MG tablet TAKE ONE TABLET BY MOUTH ONCE DAILY 01/26/23   Doreatha Massed, MD  ascorbic acid (VITAMIN C) 500 MG tablet Take 500 mg by mouth daily. 02/07/22   [provider]  aspirin EC 81 MG tablet Take 81 mg by mouth at bedtime.    [provider]  B Complex Vitamins (VITAMIN B COMPLEX) TABS Take 1 tablet by mouth daily.    [provider]  B Complex-Folic Acid (B COMPLEX VITAMINS, W/ FA,) CAPS Take 1 capsule by mouth daily. 09/09/21   [provider]  bortezomib SQ (VELCADE) 3.5 MG injection Inject into the skin once. 08/10/21   [provider]  busPIRone (BUSPAR) 5 MG tablet Take 5 mg by mouth daily.    Elam Dutch, MD  Calcium Carbonate-Vitamin D (CALCIUM 600+D PO) Take 1 tablet by mouth 2 (two) times daily.    [provider]  chlorthalidone (HYGROTON) 25 MG tablet Take 25 mg by mouth daily. 06/04/22   [provider]  cholecalciferol (VITAMIN D3) 10 MCG (400 UNIT) TABS tablet Take 400 Units by mouth daily. 09/30/22   [provider]  cyanocobalamin (VITAMIN B12) 1000 MCG tablet Take by mouth. 10/12/20   [provider]  diclofenac Sodium (VOLTAREN) 1 % GEL Apply 4 g topically 4 (four) times daily. 11/19/21   Gilmore Laroche, FNP  fluticasone (FLONASE) 50 MCG/ACT nasal spray USE 2 SPRAYS IN EACH NOSTRIL ONCE DAILY 11/28/22   Gilmore Laroche, FNP  fluticasone-salmeterol (ADVAIR) 250-50 MCG/ACT AEPB Inhale into the lungs. 10/12/20   [provider]  folic acid (FOLVITE) 1 MG tablet TAKE 1 TABLET BY MOUTH ONCE DAILY. 03/15/22   Doreatha Massed, MD  gabapentin (NEURONTIN) 100 MG capsule Take 100 mg by mouth 3 (three) times daily. 05/07/19   [provider]  HYDROcodone-acetaminophen (NORCO/VICODIN) 5-325 MG tablet TAKE ONE TABLET BY MOUTH EVERY 8 HOURS AS NEEDED FOR MODERATE PAIN 10/10/22   Doreatha Massed, MD  hydrOXYzine (VISTARIL) 25 MG capsule Take 1 capsule (25 mg total) by mouth at bedtime as needed. 02/24/23   Gilmore Laroche, FNP  Hyoscyamine Sulfate SL 0.125 MG SUBL     [provider]  lenalidomide (REVLIMID) 10 MG capsule Take 1 capsule (10 mg total) by mouth daily. 21 days on, 7 days off every  28 days 02/14/23   Doreatha Massed, MD  levocetirizine Elita Boone) 5 MG tablet TAKE ONE TABLET BY MOUTH IN THE EVENING 01/26/23   Gilmore Laroche, FNP  lidocaine (HM LIDOCAINE PATCH) 4 % Place 1 patch onto the skin daily. 03/21/22   Rondel Baton, MD  losartan (COZAAR) 100 MG tablet TAKE ONE TABLET BY MOUTH ONCE DAILY 01/26/23   Gilmore Laroche, FNP  magnesium oxide (MAG-OX) 400 (240 Mg) MG tablet Take 1 tablet (400 mg total) by mouth 2 (two) times daily. 10/03/22   Doreatha Massed, MD  metoprolol tartrate (LOPRESSOR) 50 MG tablet Take 1 tablet (50 mg total) by  mouth 2 (two) times daily. 10/07/21   [name], FNP  montelukast (SINGULAIR) 10 MG tablet TAKE ONE TABLET BY MOUTH AT BEDTIME 02/10/23   Gilmore Laroche, FNP  MYRBETRIQ 50 MG TB24 tablet TAKE ONE TABLET BY MOUTH ONCE DAILY 02/10/23   Gilmore Laroche, FNP  nitroGLYCERIN (NITROSTAT) 0.4 MG SL tablet Place 0.4 mg under the tongue every 5 (five) minutes as needed for chest pain. Patient not taking: Reported on 03/02/2023    [provider]  pantoprazole (PROTONIX) 40 MG tablet Take 1 tablet (40 mg total) by mouth 2 (two) times daily. 02/24/23   Gilmore Laroche, FNP  potassium chloride SA (KLOR-CON M) 20 MEQ tablet Take 1 tablet (20 mEq total) by mouth 2 (two) times daily. 01/16/23   Del Nigel Berthold, FNP  primidone (MYSOLINE) 50 MG tablet Take by mouth. 03/31/21   [provider]  Probiotic Product (GNP PROBIOTIC COLON SUPPORT) CAPS Take 1 capsule by mouth daily. 07/08/21   [provider]  Saccharomyces boulardii (PROBIOTIC) 250 MG CAPS Take 1 capsule by mouth daily. 12/09/22   [provider]  sertraline (ZOLOFT) 25 MG tablet Take 1 tablet (25 mg total) by mouth daily. 12/19/22   Gilmore Laroche, FNP  simvastatin (ZOCOR) 40 MG tablet TAKE ONE TABLET BY MOUTH AT BEDTIME 10/24/22   Gilmore Laroche, FNP  tadalafil, PAH, (ALYQ) 20 MG tablet Take 2 tablets every day by oral route.    [provider]  topiramate (TOPAMAX) 25 MG tablet Take 25 mg by mouth 2 (two) times daily. 01/06/23   [provider]  warfarin (COUMADIN) 3 MG tablet Take 3 mg by mouth daily. 06/23/21   [provider]      Allergies    Amoxicillin-pot clavulanate, Clarithromycin, Erythromycin, Lisinopril, and Amoxicillin    Review of Systems   Review of Systems  All other systems reviewed and are negative.   Physical Exam Updated Vital Signs BP 106/63 (BP Location: Left Arm)   Pulse 60   Temp (!) 97.4 F (36.3 C) (Oral)   Resp 18   Ht 5\' 4"  (1.626 m)   Wt  65.8 kg   SpO2 99%   BMI 24.89 kg/m  Physical Exam Vitals and nursing note reviewed.  Constitutional:      General: She is not in acute distress.    Appearance: She is well-developed. She is not diaphoretic.  HENT:     Head: Normocephalic and atraumatic.  Cardiovascular:     Rate and Rhythm: Normal rate and regular rhythm.     Heart sounds: No murmur heard.    No friction rub. No gallop.  Pulmonary:     Effort: Pulmonary effort is normal. No respiratory distress.     Breath sounds: Normal breath sounds. No wheezing.  Abdominal:     General: Bowel sounds are normal. There is  no distension.     Palpations: Abdomen is soft.     Tenderness: There is no abdominal tenderness.  Musculoskeletal:        General: Normal range of motion.     Cervical back: Normal range of motion and neck supple.  Skin:    General: Skin is warm and dry.  Neurological:     General: No focal deficit present.     Mental Status: She is alert and oriented to person, place, and time.     ED Results / Procedures / Treatments   Labs (all labs ordered are listed, but only abnormal results are displayed) Labs Reviewed  CBC WITH DIFFERENTIAL/PLATELET - Abnormal; Notable for the following components:      Result Value   WBC 3.5 (*)    RBC 3.46 (*)    HCT 34.7 (*)    MCV 100.3 (*)    MCH 34.7 (*)    RDW 16.2 (*)    Platelets 124 (*)    All other components within normal limits  COMPREHENSIVE METABOLIC PANEL - Abnormal; Notable for the following components:   Sodium 131 (*)    Potassium 2.9 (*)    Glucose, Bld 131 (*)    Calcium 8.5 (*)    Total Protein 6.4 (*)    All other components within normal limits  MAGNESIUM    EKG None  Radiology No results found.  Procedures Procedures    Medications Ordered in ED Medications  potassium chloride 10 mEq in 100 mL IVPB (has no administration in time range)  potassium chloride SA (KLOR-CON M) CR tablet 40 mEq (has no administration in time range)   HYDROcodone-acetaminophen (NORCO/VICODIN) 5-325 MG per tablet 1 tablet (has no administration in time range)    ED Course/ Medical Decision Making/ A&P  Patient is a 72 year old female with past medical history as per HPI sent by her primary care provider for a potassium level of 2.7.  Patient is otherwise asymptomatic.  She arrives with stable vital signs and is afebrile.  Physical examination basically unremarkable.  Workup initiated including CBC and basic metabolic panel.  Potassium today is 2.9.  Patient has been given IV along with oral potassium and will be discharged with an increased dose of potassium at home.  She is to follow-up with primary doctor next week for a recheck metabolic panel.  Final Clinical Impression(s) / ED Diagnoses Final diagnoses:  None    Rx / DC Orders ED Discharge Orders     None         Geoffery Lyons, MD 03/04/23 [phone]

## 2023-03-07 ENCOUNTER — Other Ambulatory Visit: Payer: Self-pay | Admitting: Family Medicine

## 2023-03-07 ENCOUNTER — Encounter: Payer: Self-pay | Admitting: Hematology

## 2023-03-07 ENCOUNTER — Encounter (HOSPITAL_COMMUNITY): Payer: Self-pay | Admitting: Hematology

## 2023-03-07 DIAGNOSIS — J301 Allergic rhinitis due to pollen: Secondary | ICD-10-CM

## 2023-03-07 DIAGNOSIS — F339 Major depressive disorder, recurrent, unspecified: Secondary | ICD-10-CM

## 2023-03-10 ENCOUNTER — Ambulatory Visit: Payer: Medicare Other | Admitting: Family Medicine

## 2023-03-13 ENCOUNTER — Other Ambulatory Visit: Payer: Self-pay

## 2023-03-14 ENCOUNTER — Other Ambulatory Visit: Payer: Self-pay | Admitting: Hematology

## 2023-03-16 ENCOUNTER — Encounter (HOSPITAL_COMMUNITY): Payer: Self-pay | Admitting: Hematology

## 2023-03-16 ENCOUNTER — Inpatient Hospital Stay: Payer: Medicare Other

## 2023-03-16 ENCOUNTER — Encounter: Payer: Self-pay | Admitting: Hematology

## 2023-03-17 ENCOUNTER — Other Ambulatory Visit: Payer: Self-pay | Admitting: Hematology

## 2023-03-21 ENCOUNTER — Encounter: Payer: Self-pay | Admitting: Family Medicine

## 2023-03-21 ENCOUNTER — Ambulatory Visit (INDEPENDENT_AMBULATORY_CARE_PROVIDER_SITE_OTHER): Payer: Medicare Other | Admitting: Family Medicine

## 2023-03-21 VITALS — BP 103/71 | HR 76 | Ht 64.0 in | Wt 145.0 lb

## 2023-03-21 DIAGNOSIS — R531 Weakness: Secondary | ICD-10-CM

## 2023-03-21 NOTE — Patient Instructions (Addendum)
I appreciate the opportunity to provide care to you today!    Follow up:  3 months  Labs: please stop by the lab today to get your blood drawn (CBC, BMP)  -You have arthritis in your hips and back. - I recommend taking OTC Tylenol Arthritis. - You may also apply Voltaren gel to the affected areas  Nonpharmacological management for arthritis pain focuses on lifestyle changes, physical therapies, and other supportive measures to alleviate symptoms and improve function. Here are effective strategies:  -Physical Activity Low-Impact Exercises: Engage in activities like swimming, walking, or cycling that are gentle on the joints. Strength Training: Incorporate light weights or resistance bands to strengthen muscles around the affected joints, providing better support. - Stretching and Range of Motion Exercises Perform gentle stretching exercises to improve flexibility and reduce stiffness. Range of motion exercises can help maintain joint function. -Heat and Cold Therapy Heat Therapy: Use warm towels, heating pads, or warm baths to relax muscles and alleviate pain. Cold Therapy: Apply ice packs wrapped in a cloth to reduce swelling and numb sharp pain. Use for 15-20 minutes at a time. 5. Dietary Changes Anti-Inflammatory Diet: Include foods rich in omega-3 fatty acids (like fish), fruits, vegetables, whole grains, nuts, and seeds. Limit processed foods, sugars, and trans fats. Hydration: Drink plenty of water to stay hydrated, which is essential for joint health. 6. Lifestyle Modifications Avoid prolonged periods of inactivity; aim for regular movement throughout the day.    Referrals today- physical therapy  Attached with your AVS, you will find valuable resources for self-education. I highly recommend dedicating some time to thoroughly examine them.   Please continue to a heart-healthy diet and increase your physical activities. Try to exercise for at least five days a week.     It was a pleasure to see you and I look forward to continuing to work together on your health and well-being. Please do not hesitate to call the office if you need care or have questions about your care.  In case of emergency, please visit the Emergency Department for urgent care, or contact our clinic at [phone] to schedule an appointment. We're here to help you!   Have a wonderful day and week. With Gratitude, Gilmore Laroche MSN, FNP-BC

## 2023-03-21 NOTE — Progress Notes (Unsigned)
Established Patient Office Visit  Subjective:  Patient ID: Courtney Rogers, female    DOB: [birth year]  Age: 72 y.o. MRN: [id number]  CC:  Chief Complaint  Patient presents with   Care Management    1 week f/u, pt reports feeling weak, has back/hip pain.     HPI Courtney Rogers is a 72 y.o. female with past medical history of essential hypertension, irritable bowel syndrome, iron deficiency anemia, smoldering myeloma presents for  ED f/u .  Generalized weakness: the patient had a recent ED visit on 03/03/23 where she was treated for hypokalemia. Since discharge, she has been compliant with her prescribed oral potassium supplements. However, she continues to experience generalized weakness and fatigue. 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 She denies any recent injury or trauma. No fever, chills, night sweats, loss of appetite, or unintentional weight loss are reported. It is also significant that the patient has a history of IgA myeloma and is currently under ongoing follow-up care with oncology.   Past Medical History:  Diagnosis Date   Acute myocardial infarction [facility]) 2009   CAD/no stent medically managed   Anxiety disorder    CAD (coronary artery disease)    Cancer (HCC)    multiple myeloma   Hyperlipidemia    Hypertension    IBS (irritable bowel syndrome)    Non-ulcer dyspepsia 04/30/2009   Qualifier: Diagnosis of  By: Darrick Penna MD, Sandi L    Overactive bladder    PE (pulmonary embolism) 10/2012   left lung   Presence of permanent cardiac pacemaker    Sleep apnea     Past Surgical History:  Procedure Laterality Date   ABDOMINAL HYSTERECTOMY     BSO secondary to cyst     CARDIAC CATHETERIZATION     COLONOSCOPY  12/2009   Dr. Aleene Davidson, propofol, normal. Next TCS 12/2019   COLONOSCOPY N/A 05/22/2017   Examined portion ileum was normal, significant looping of colon, external  hemorrhoids and rectal bleeding due to internal hemorrhoids, mild diverticulosis procedure: COLONOSCOPY;  Surgeon: West Bali, MD;  Location: [facility];  Service: Endoscopy;  Laterality: N/A;  10:30am   ESOPHAGOGASTRODUODENOSCOPY  05/11/2009   schatzki ring/small hiatal hernia/path:gastritis   ESOPHAGOGASTRODUODENOSCOPY N/A 05/22/2017   Multiple gastric polyps with biopsy benign fundic gland polyp, small bowel biopsy negative for celiac, gastric biopsy with mild gastritis but no H. pylori procedure: ESOPHAGOGASTRODUODENOSCOPY (EGD);  Surgeon: West Bali, MD;  Location: [facility];  Service: Endoscopy;  Laterality: N/A;   ESOPHAGOGASTRODUODENOSCOPY (EGD) WITH ESOPHAGEAL DILATION N/A 04/01/2013   [id number] at the gastroesophageal juction/multiple small polyps/mild gastritis   GIVENS CAPSULE STUDY N/A 06/07/2017   Frequent gastric erosions, normal small bowel mucosa.  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    Family History  Problem Relation Age of Onset   Colon polyps Neg Hx    Colon cancer Neg Hx     Social History   Socioeconomic History   Marital status: Married    Spouse name: [name]   Number of children: 3   Years of education: 16   Highest education level: Master's degree (e.g., MA, MS, MEng, MEd, MSW, MBA)  Occupational  History   Not on file  Tobacco Use   Smoking status: Never    Passive exposure: Never   Smokeless tobacco: Never   Tobacco comments:    Never smoked   Vaping Use   Vaping status: Never Used  Substance and Sexual Activity   Alcohol use: Not Currently    Comment: occ wine   Drug use: No   Sexual activity: Not Currently  Other Topics Concern   Not on file  Social History Narrative      Lives with husband-51 years 952  in Aug 2021   Daughter is close by, 2 sons live further away   One IN [place],ONE IN [place], ONE BESIDE HER.        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   Enjoys: reading, young adult      Diet: eats all food groups    Caffeine: coffee 1, tea daily soda-1 daily   Water: 1-2 cups      Wears seat belt    Does not use phone while driving   Smoke detectors at home    Licensed conveyancer -locked up      Left handed   One story home   Drinks caffeine   Social Determinants of Health   Financial Resource Strain: Low Risk  (03/17/2023)   Overall Financial Resource Strain (CARDIA)    Difficulty of Paying Living Expenses: Not hard at all  Food Insecurity: No Food Insecurity (03/17/2023)   Hunger Vital Sign    Worried About Running Out of Food in the Last Year: Never true    Ran Out of Food in the Last Year: Never true  Transportation Needs: No Transportation Needs (03/17/2023)   PRAPARE - Administrator, Civil Service (Medical): No    Lack of Transportation (Non-Medical): No  Physical Activity: Insufficiently Active (03/17/2023)   Exercise Vital Sign    Days of Exercise per Week: 1 day    Minutes of Exercise per Session: 10 min  Stress: Stress Concern Present (03/17/2023)   [employer] of Occupational Health - Occupational Stress Questionnaire    Feeling of Stress : Very much  Social Connections: Socially Integrated (03/17/2023)   Social Connection and Isolation Panel [NHANES]    Frequency of Communication with Friends and Family: More than three times a week    Frequency of Social Gatherings with Friends and Family: Never    Attends Religious Services: 1 to 4 times per year    Active Member of [employer] or Organizations: Yes    Attends Banker Meetings: 1 to 4 times per year    Marital Status: Married  Catering manager Violence: Not At Risk (12/21/2022)   Humiliation, Afraid, Rape, and Kick questionnaire    Fear of Current or  Ex-Partner: No    Emotionally Abused: No    Physically Abused: No    Sexually Abused: No    Outpatient Medications Prior to Visit  Medication Sig Dispense Refill   acetaminophen (TYLENOL) 500 MG tablet Take 1,000 mg by mouth 2 (two) times daily as needed for moderate pain or headache.     acyclovir (ZOVIRAX) 400 MG tablet TAKE ONE TABLET BY MOUTH TWICE DAILY 60 tablet 6   amLODipine (NORVASC) 10 MG tablet TAKE ONE TABLET BY MOUTH ONCE DAILY 90 tablet 0   ascorbic acid (VITAMIN C) 500 MG tablet Take 500 mg by mouth daily.     aspirin EC 81 MG tablet Take 81 mg  by mouth at bedtime.     B Complex Vitamins (VITAMIN B COMPLEX) TABS Take 1 tablet by mouth daily.     B Complex-Folic Acid (B COMPLEX VITAMINS, W/ FA,) CAPS Take 1 capsule by mouth daily.     bortezomib SQ (VELCADE) 3.5 MG injection Inject into the skin once.     busPIRone (BUSPAR) 5 MG tablet Take 5 mg by mouth daily.     Calcium Carbonate-Vitamin D (CALCIUM 600+D PO) Take 1 tablet by mouth 2 (two) times daily.     chlorthalidone (HYGROTON) 25 MG tablet Take 25 mg by mouth daily.     cholecalciferol (VITAMIN D3) 10 MCG (400 UNIT) TABS tablet Take 400 Units by mouth daily.     cyanocobalamin (VITAMIN B12) 1000 MCG tablet Take by mouth.     diclofenac Sodium (VOLTAREN) 1 % GEL Apply 4 g topically 4 (four) times daily. 50 g 0   fluticasone (FLONASE) 50 MCG/ACT nasal spray USE 2 SPRAYS IN EACH NOSTRIL ONCE DAILY 16 g 0   fluticasone-salmeterol (ADVAIR) 250-50 MCG/ACT AEPB Inhale into the lungs.     folic acid (FOLVITE) 1 MG tablet TAKE ONE TABLET BY MOUTH EVERY DAY 30 tablet 11   gabapentin (NEURONTIN) 100 MG capsule Take 100 mg by mouth 3 (three) times daily.     HYDROcodone-acetaminophen (NORCO/VICODIN) 5-325 MG tablet TAKE ONE TABLET BY MOUTH EVERY 8 HOURS AS NEEDED FOR MODERATE PAIN 60 tablet 0   hydrOXYzine (VISTARIL) 25 MG capsule Take 1 capsule (25 mg total) by mouth at bedtime as needed. 30 capsule 0   Hyoscyamine Sulfate SL  0.125 MG SUBL      lenalidomide (REVLIMID) 10 MG capsule Take 1 capsule (10 mg total) by mouth daily. 21 days on, 7 days off every 28 days 21 capsule 0   levocetirizine (XYZAL) 5 MG tablet TAKE ONE TABLET BY MOUTH IN THE EVENING 30 tablet 1   lidocaine (HM LIDOCAINE PATCH) 4 % Place 1 patch onto the skin daily. 14 patch 0   losartan (COZAAR) 100 MG tablet TAKE ONE TABLET BY MOUTH ONCE DAILY 30 tablet 3   magnesium oxide (MAG-OX) 400 (240 Mg) MG tablet Take 1 tablet (400 mg total) by mouth 2 (two) times daily. 60 tablet 4   metoprolol tartrate (LOPRESSOR) 50 MG tablet Take 1 tablet (50 mg total) by mouth 2 (two) times daily. 180 tablet 1   montelukast (SINGULAIR) 10 MG tablet TAKE ONE TABLET BY MOUTH AT BEDTIME 30 tablet 3   MYRBETRIQ 50 MG TB24 tablet TAKE ONE TABLET BY MOUTH ONCE DAILY 30 tablet 3   nitroGLYCERIN (NITROSTAT) 0.4 MG SL tablet Place 0.4 mg under the tongue every 5 (five) minutes as needed for chest pain.     pantoprazole (PROTONIX) 40 MG tablet Take 1 tablet (40 mg total) by mouth 2 (two) times daily. 90 tablet 1   potassium chloride SA (KLOR-CON M) 20 MEQ tablet Take 1 tablet (20 mEq total) by mouth 2 (two) times daily.     primidone (MYSOLINE) 50 MG tablet Take by mouth.     Probiotic Product (GNP PROBIOTIC COLON SUPPORT) CAPS Take 1 capsule by mouth daily.     Saccharomyces boulardii (PROBIOTIC) 250 MG CAPS Take 1 capsule by mouth daily.     sertraline (ZOLOFT) 25 MG tablet TAKE ONE TABLET BY MOUTH ONCE DAILY 30 tablet 3   simvastatin (ZOCOR) 40 MG tablet TAKE ONE TABLET BY MOUTH AT BEDTIME 90 tablet 1   tadalafil, PAH, (ALYQ)  20 MG tablet Take 2 tablets every day by oral route.     topiramate (TOPAMAX) 25 MG tablet Take 25 mg by mouth 2 (two) times daily.     warfarin (COUMADIN) 3 MG tablet Take 3 mg by mouth daily.     No facility-administered medications prior to visit.    Allergies  Allergen Reactions   Amoxicillin-Pot Clavulanate Other (See Comments)    Clarithromycin Other (See Comments)    Stomach problems   Erythromycin    Lisinopril Swelling   Amoxicillin Diarrhea    ROS Review of Systems  Constitutional:  Positive for fatigue. Negative for chills and fever.  Eyes:  Negative for visual disturbance.  Respiratory:  Negative for chest tightness and shortness of breath.   Musculoskeletal:  Positive for back pain.  Neurological:  Positive for weakness. Negative for dizziness and headaches.      Objective:    Physical Exam HENT:     Head: Normocephalic.     Mouth/Throat:     Mouth: Mucous membranes are moist.  Cardiovascular:     Rate and Rhythm: Normal rate.     Heart sounds: Normal heart sounds.  Pulmonary:     Effort: Pulmonary effort is normal.     Breath sounds: Normal breath sounds.  Neurological:     Mental Status: She is alert.     BP 103/71   Pulse 76   Ht 5\' 4"  (1.626 m)   Wt 145 lb (65.8 kg)   SpO2 100%   BMI 24.89 kg/m  Wt Readings from Last 3 Encounters:  03/21/23 145 lb (65.8 kg)  03/03/23 145 lb (65.8 kg)  03/02/23 145 lb (65.8 kg)    Lab Results  Component Value Date   TSH 0.694 02/24/2023   Lab Results  Component Value Date   WBC 3.9 03/21/2023   HGB 11.4 03/21/2023   HCT 33.7 (L) 03/21/2023   MCV 105 (H) 03/21/2023   PLT 143 (L) 03/21/2023   Lab Results  Component Value Date   NA 141 03/21/2023   K 3.1 (L) 03/21/2023   CO2 24 03/21/2023   GLUCOSE 103 (H) 03/21/2023   BUN 20 03/21/2023   CREATININE 0.86 03/21/2023   BILITOT 0.7 03/03/2023   ALKPHOS 51 03/03/2023   AST 20 03/03/2023   ALT 16 03/03/2023   PROT 6.4 (L) 03/03/2023   ALBUMIN 3.7 03/03/2023   CALCIUM 9.2 03/21/2023   ANIONGAP 7 03/03/2023   EGFR 71 03/21/2023   Lab Results  Component Value Date   CHOL 146 06/27/2022   Lab Results  Component Value Date   HDL 65 06/27/2022   Lab Results  Component Value Date   LDLCALC 62 06/27/2022   Lab Results  Component Value Date   TRIG 102 06/27/2022   Lab  Results  Component Value Date   CHOLHDL 2.2 06/27/2022   Lab Results  Component Value Date   HGBA1C 5.6 02/24/2023      Assessment & Plan:  Generalized weakness Assessment & Plan: A repeat BMP will be performed to assess the patient's potassium levels. A CBC will also be ordered to rule out anemia, given the patient's history of iron deficiency anemia. Supportive care measures were encouraged, including maintaining a well-balanced diet, ensuring adequate sleep, increasing physical activity, and allowing sufficient rest. A referral to physical therapy was placed to introduce stretching and strengthening exercises aimed at managing generalized fatigue and alleviating hip and back osteoarthritis pain. The patient was advised to use heat applications,  avoid activities that could worsen her condition, and take frequent rest as needed. The patient verbalized understanding of these recommendations.   Orders: -     Ambulatory referral to Physical Therapy -     CBC with Differential/Platelet -     BMP8+EGFR  Note: This chart has been completed using Engineer, civil (consulting) software, and while attempts have been made to ensure accuracy, certain words and phrases may not be transcribed as intended.    Follow-up: Return in about 3 months (around 06/21/2023).   Gilmore Laroche, FNP

## 2023-03-22 ENCOUNTER — Encounter: Payer: Self-pay | Admitting: Hematology

## 2023-03-22 LAB — BMP8+EGFR
BUN/Creatinine Ratio: 23 (ref 12–28)
BUN: 20 mg/dL (ref 8–27)
CO2: 24 mmol/L (ref 20–29)
Calcium: 9.2 mg/dL (ref 8.7–10.3)
Chloride: 103 mmol/L (ref 96–106)
Creatinine, Ser: 0.86 mg/dL (ref 0.57–1.00)
Glucose: 103 mg/dL — ABNORMAL HIGH (ref 70–99)
Potassium: 3.1 mmol/L — ABNORMAL LOW (ref 3.5–5.2)
Sodium: 141 mmol/L (ref 134–144)
eGFR: 71 mL/min/{1.73_m2} (ref >59)

## 2023-03-22 LAB — CBC WITH DIFFERENTIAL/PLATELET
Basophils Absolute: 0 10*3/uL (ref 0.0–0.2)
Basos: 0 %
EOS (ABSOLUTE): 0.1 10*3/uL (ref 0.0–0.4)
Eos: 3 %
Hematocrit: 33.7 % — ABNORMAL LOW (ref 34.0–46.6)
Hemoglobin: 11.4 g/dL (ref 11.1–15.9)
Immature Grans (Abs): 0 10*3/uL (ref 0.0–0.1)
Immature Granulocytes: 0 %
Lymphocytes Absolute: 1 10*3/uL (ref 0.7–3.1)
Lymphs: 25 %
MCH: 35.4 pg — ABNORMAL HIGH (ref 26.6–33.0)
MCHC: 33.8 g/dL (ref 31.5–35.7)
MCV: 105 fL — ABNORMAL HIGH (ref 79–97)
Monocytes Absolute: 0.5 10*3/uL (ref 0.1–0.9)
Monocytes: 12 %
Neutrophils Absolute: 2.3 10*3/uL (ref 1.4–7.0)
Neutrophils: 60 %
Platelets: 143 10*3/uL — ABNORMAL LOW (ref 150–450)
RBC: 3.22 x10E6/uL — ABNORMAL LOW (ref 3.77–5.28)
RDW: 16.1 % — ABNORMAL HIGH (ref 11.7–15.4)
WBC: 3.9 10*3/uL (ref 3.4–10.8)

## 2023-03-23 ENCOUNTER — Inpatient Hospital Stay: Payer: Medicare Other

## 2023-03-23 DIAGNOSIS — C9000 Multiple myeloma not having achieved remission: Secondary | ICD-10-CM | POA: Diagnosis not present

## 2023-03-23 LAB — CBC WITH DIFFERENTIAL/PLATELET
Abs Immature Granulocytes: 0.01 10*3/uL (ref 0.00–0.07)
Basophils Absolute: 0 10*3/uL (ref 0.0–0.1)
Basophils Relative: 1 %
Eosinophils Absolute: 0.1 10*3/uL (ref 0.0–0.5)
Eosinophils Relative: 2 %
HCT: 34.7 % — ABNORMAL LOW (ref 36.0–46.0)
Hemoglobin: 11.2 g/dL — ABNORMAL LOW (ref 12.0–15.0)
Immature Granulocytes: 0 %
Lymphocytes Relative: 26 %
Lymphs Abs: 1.1 10*3/uL (ref 0.7–4.0)
MCH: 33.8 pg (ref 26.0–34.0)
MCHC: 32.3 g/dL (ref 30.0–36.0)
MCV: 104.8 fL — ABNORMAL HIGH (ref 80.0–100.0)
Monocytes Absolute: 0.5 10*3/uL (ref 0.1–1.0)
Monocytes Relative: 12 %
Neutro Abs: 2.5 10*3/uL (ref 1.7–7.7)
Neutrophils Relative %: 59 %
Platelets: 116 10*3/uL — ABNORMAL LOW (ref 150–400)
RBC: 3.31 MIL/uL — ABNORMAL LOW (ref 3.87–5.11)
RDW: 17 % — ABNORMAL HIGH (ref 11.5–15.5)
WBC: 4.2 10*3/uL (ref 4.0–10.5)
nRBC: 0 % (ref 0.0–0.2)

## 2023-03-23 LAB — COMPREHENSIVE METABOLIC PANEL
ALT: 19 U/L (ref 0–44)
AST: 23 U/L (ref 15–41)
Albumin: 3.8 g/dL (ref 3.5–5.0)
Alkaline Phosphatase: 44 U/L (ref 38–126)
Anion gap: 12 (ref 5–15)
BUN: 23 mg/dL (ref 8–23)
CO2: 26 mmol/L (ref 22–32)
Calcium: 9.2 mg/dL (ref 8.9–10.3)
Chloride: 99 mmol/L (ref 98–111)
Creatinine, Ser: 0.92 mg/dL (ref 0.44–1.00)
GFR, Estimated: >60 mL/min (ref >60)
Glucose, Bld: 100 mg/dL — ABNORMAL HIGH (ref 70–99)
Potassium: 2.9 mmol/L — ABNORMAL LOW (ref 3.5–5.1)
Sodium: 137 mmol/L (ref 135–145)
Total Bilirubin: 0.5 mg/dL (ref 0.3–1.2)
Total Protein: 6.7 g/dL (ref 6.5–8.1)

## 2023-03-23 LAB — MAGNESIUM: Magnesium: 2.1 mg/dL (ref 1.7–2.4)

## 2023-03-23 NOTE — Assessment & Plan Note (Signed)
A repeat BMP will be performed to assess the patient's potassium levels. A CBC will also be ordered to rule out anemia, given the patient's history of iron deficiency anemia. Supportive care measures were encouraged, including maintaining a well-balanced diet, ensuring adequate sleep, increasing physical activity, and allowing sufficient rest. A referral to physical therapy was placed to introduce stretching and strengthening exercises aimed at managing generalized fatigue and alleviating hip and back osteoarthritis pain. The patient was advised to use heat applications, avoid activities that could worsen her condition, and take frequent rest as needed. The patient verbalized understanding of these recommendations.

## 2023-03-24 LAB — KAPPA/LAMBDA LIGHT CHAINS
Kappa free light chain: 13.3 mg/L (ref 3.3–19.4)
Kappa, lambda light chain ratio: 0.56 (ref 0.26–1.65)
Lambda free light chains: 23.6 mg/L (ref 5.7–26.3)

## 2023-03-26 LAB — PROTEIN ELECTROPHORESIS, SERUM
A/G Ratio: 1.5 (ref 0.7–1.7)
Albumin ELP: 3.8 g/dL (ref 2.9–4.4)
Alpha-1-Globulin: 0.3 g/dL (ref 0.0–0.4)
Alpha-2-Globulin: 0.6 g/dL (ref 0.4–1.0)
Beta Globulin: 1 g/dL (ref 0.7–1.3)
Gamma Globulin: 0.6 g/dL (ref 0.4–1.8)
Globulin, Total: 2.5 g/dL (ref 2.2–3.9)
M-Spike, %: 0.2 g/dL — ABNORMAL HIGH
Total Protein ELP: 6.3 g/dL (ref 6.0–8.5)

## 2023-03-27 ENCOUNTER — Ambulatory Visit (INDEPENDENT_AMBULATORY_CARE_PROVIDER_SITE_OTHER): Payer: Medicare Other | Admitting: Psychology

## 2023-03-27 DIAGNOSIS — F411 Generalized anxiety disorder: Secondary | ICD-10-CM

## 2023-03-27 DIAGNOSIS — F419 Anxiety disorder, unspecified: Secondary | ICD-10-CM | POA: Diagnosis not present

## 2023-03-27 NOTE — Progress Notes (Signed)
[facility] Counselor Initial Adult Exam  Name: Courtney Rogers Date: 03/27/2023 MRN: [id number] DOB: [birth year] PCP: Gilmore Laroche, FNP  Time Spent: 12:24  pm - 1:18 pm : 54 Minutes  Guardian/Payee:  self    Paperwork requested: No   Reason for Visit /Presenting Problem:  Depression and Anxiety.   Mental Status Exam: Appearance:   Neat     Behavior:  Resistant  Motor:  Shuffling Gait  Speech/Language:   Slow  Affect:  Flat  Mood:  dysthymic  Thought process:  normal  Thought content:    WNL  Sensory/Perceptual disturbances:    WNL  Orientation:  oriented to person, place, time/date, and situation  Attention:  Good  Concentration:  Good  Memory:  WNL  Fund of knowledge:   Good  Insight:    Good  Judgment:   Good  Impulse Control:  Good   Reported Symptoms:  depression and anxiety.   Risk Assessment: Danger to Self:  No Self-injurious Behavior: No Danger to Others: No Duty to Warn:no Physical Aggression / Violence:No  Access to Firearms a concern: No  Gang Involvement:No  Patient / guardian was educated about steps to take if suicide or homicide risk level increases between visits: yes While future psychiatric events cannot be accurately predicted, the patient does not currently require acute inpatient psychiatric care and does not currently meet [facility] involuntary commitment criteria.   In case of a mental health emergency:  57 - confidential suicide hotline. Visiting [facility]):        [address], [place] [address]       [phone] 3.   911  4.   Visiting Nearest ED.   Substance Abuse History: Current substance abuse: No     Caffeine: 3x coffee daily. Tobacco: denied Alcohol: denied.  Substance use: denied.   Past Psychiatric History:   Previous psychological history is significant for anxiety and depression Outpatient Providers: She noted a history of counseling ~  History of  Psych Hospitalization: No  Psychological Testing:  NA    Katie denied any family history of mental health concerns.    Abuse History:  Victim of: No.,  na    Report needed: No. Victim of Neglect:No. 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   Military Service: No   Educational History: Education: post Engineer, maintenance (IT) work or degree : Masters in Programme researcher, broadcasting/film/video.   Religion/Sprituality/World View: Baptist  Any cultural differences that may affect / interfere with treatment:  not applicable   Recreation/Hobbies: Puzzles.  Stressors: Other: health, Sister's health (hard of hearing), husband's cognitive functioning.     Strengths: Supportive Relationships  Barriers:  Mood   Legal History: Pending legal issue / charges: The patient has no significant history of legal issues. History of legal issue / charges:  na  Medical History/Surgical History: reviewed Past Medical History:  Diagnosis Date   Acute myocardial infarction [facility]) 2009   CAD/no stent medically managed   Anxiety disorder    CAD (coronary artery disease)    Cancer (HCC)    multiple myeloma   Hyperlipidemia    Hypertension    IBS (irritable bowel syndrome)    Non-ulcer dyspepsia 04/30/2009   Qualifier: Diagnosis of  By: Darrick Penna MD, Sandi L    Overactive bladder    PE (pulmonary embolism) 10/2012    left lung   Presence of permanent cardiac pacemaker    Sleep apnea     Past Surgical History:  Procedure Laterality Date   ABDOMINAL HYSTERECTOMY     BSO secondary to cyst     CARDIAC CATHETERIZATION     COLONOSCOPY  12/2009   Dr. Aleene Davidson, propofol, normal. Next TCS 12/2019   COLONOSCOPY N/A 05/22/2017   Examined portion ileum was normal, significant looping of colon, external hemorrhoids and rectal bleeding due to internal hemorrhoids, mild diverticulosis procedure: COLONOSCOPY;  Surgeon: West Bali, MD;  [facility];  Service: Endoscopy;  Laterality: N/A;  10:30am   ESOPHAGOGASTRODUODENOSCOPY  05/11/2009   schatzki ring/small hiatal hernia/path:gastritis   ESOPHAGOGASTRODUODENOSCOPY N/A 05/22/2017   Multiple gastric polyps with biopsy benign fundic gland polyp, small bowel biopsy negative for celiac, gastric biopsy with mild gastritis but no H. pylori procedure: ESOPHAGOGASTRODUODENOSCOPY (EGD);  Surgeon: West Bali, MD;  Location: [facility];  Service: Endoscopy;  Laterality: N/A;   ESOPHAGOGASTRODUODENOSCOPY (EGD) WITH ESOPHAGEAL DILATION N/A 04/01/2013   [id number] at the gastroesophageal juction/multiple small polyps/mild gastritis   GIVENS CAPSULE STUDY N/A 06/07/2017   Frequent gastric erosions, normal small bowel mucosa.  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     acyclovir (ZOVIRAX) 400 MG tablet TAKE ONE TABLET BY MOUTH TWICE DAILY 60 tablet 6   amLODipine (NORVASC) 10 MG tablet TAKE ONE TABLET BY MOUTH ONCE DAILY 90 tablet 0    ascorbic acid (VITAMIN C) 500 MG tablet Take 500 mg by mouth daily.     aspirin EC 81 MG tablet Take 81 mg by mouth at bedtime.     B Complex Vitamins (VITAMIN B COMPLEX) TABS Take 1 tablet by mouth daily.     B Complex-Folic Acid (B COMPLEX VITAMINS, W/ FA,) CAPS Take 1 capsule by mouth daily.     bortezomib SQ (VELCADE) 3.5 MG injection Inject into the skin once.     busPIRone (BUSPAR) 5 MG tablet Take 5 mg by mouth daily.     Calcium Carbonate-Vitamin D (CALCIUM 600+D PO) Take 1 tablet by mouth 2 (two) times daily.     chlorthalidone (HYGROTON) 25 MG tablet Take 25 mg by mouth daily.     cholecalciferol (VITAMIN D3) 10 MCG (400 UNIT) TABS tablet Take 400 Units by mouth daily.     cyanocobalamin (VITAMIN B12) 1000 MCG tablet Take by mouth.     diclofenac Sodium (VOLTAREN) 1 % GEL Apply 4 g topically 4 (four) times daily. 50 g 0  fluticasone (FLONASE) 50 MCG/ACT nasal spray USE 2 SPRAYS IN EACH NOSTRIL ONCE DAILY 16 g 0   fluticasone-salmeterol (ADVAIR) 250-50 MCG/ACT AEPB Inhale into the lungs.     folic acid (FOLVITE) 1 MG tablet TAKE ONE TABLET BY MOUTH EVERY DAY 30 tablet 11   gabapentin (NEURONTIN) 100 MG capsule Take 100 mg by mouth 3 (three) times daily.     HYDROcodone-acetaminophen (NORCO/VICODIN) 5-325 MG tablet TAKE ONE TABLET BY MOUTH EVERY 8 HOURS AS NEEDED FOR MODERATE PAIN 60 tablet 0   hydrOXYzine (VISTARIL) 25 MG capsule Take 1 capsule (25 mg total) by mouth at bedtime as needed. 30 capsule 0   Hyoscyamine Sulfate SL 0.125 MG SUBL      lenalidomide (REVLIMID) 10 MG capsule Take 1 capsule (10 mg total) by mouth daily. 21 days on, 7 days off every 28 days 21 capsule 0   levocetirizine (XYZAL) 5 MG tablet TAKE ONE TABLET BY MOUTH IN THE EVENING 30 tablet 1   lidocaine (HM LIDOCAINE PATCH) 4 % Place 1 patch onto the skin daily. 14 patch 0   losartan (COZAAR) 100 MG tablet TAKE ONE TABLET BY MOUTH ONCE DAILY 30 tablet 3   magnesium oxide (MAG-OX) 400 (240 Mg) MG tablet Take 1  tablet (400 mg total) by mouth 2 (two) times daily. 60 tablet 4   metoprolol tartrate (LOPRESSOR) 50 MG tablet Take 1 tablet (50 mg total) by mouth 2 (two) times daily. 180 tablet 1   montelukast (SINGULAIR) 10 MG tablet TAKE ONE TABLET BY MOUTH AT BEDTIME 30 tablet 3   MYRBETRIQ 50 MG TB24 tablet TAKE ONE TABLET BY MOUTH ONCE DAILY 30 tablet 3   nitroGLYCERIN (NITROSTAT) 0.4 MG SL tablet Place 0.4 mg under the tongue every 5 (five) minutes as needed for chest pain.     pantoprazole (PROTONIX) 40 MG tablet Take 1 tablet (40 mg total) by mouth 2 (two) times daily. 90 tablet 1   potassium chloride SA (KLOR-CON M) 20 MEQ tablet Take 1 tablet (20 mEq total) by mouth 2 (two) times daily.     primidone (MYSOLINE) 50 MG tablet Take by mouth.     Probiotic Product (GNP PROBIOTIC COLON SUPPORT) CAPS Take 1 capsule by mouth daily.     Saccharomyces boulardii (PROBIOTIC) 250 MG CAPS Take 1 capsule by mouth daily.     sertraline (ZOLOFT) 25 MG tablet TAKE ONE TABLET BY MOUTH ONCE DAILY 30 tablet 3   simvastatin (ZOCOR) 40 MG tablet TAKE ONE TABLET BY MOUTH AT BEDTIME 90 tablet 1   tadalafil, PAH, (ALYQ) 20 MG tablet Take 2 tablets every day by oral route.     topiramate (TOPAMAX) 25 MG tablet Take 25 mg by mouth 2 (two) times daily.     warfarin (COUMADIN) 3 MG tablet Take 3 mg by mouth daily.     No current facility-administered medications for this visit.    Allergies  Allergen Reactions   Amoxicillin-Pot Clavulanate Other (See Comments)   Clarithromycin Other (See Comments)    Stomach problems   Erythromycin    Lisinopril Swelling   Amoxicillin Diarrhea    Diagnoses:  Anxiety state  Psychiatric Treatment: Yes , via Gilmore Laroche, FNP via [facility]  Plan of Care: Outpatient therapy.   Narrative:  Etta Grandchild participated from office with therapist and consented to treatment. We reviewed the limits of confidentiality prior to the start of the evaluation.  Etta Grandchild expressed understanding and agreement to proceed.  Lilymae was referred by her oncologist, Dr. Ellin Saba, for psychiatric treatment for depression and anxiety. She is receiving psychotropic medication via her family medicine nurse practitioner and noted feeling better with the medication. Therapist discussed this with Renee and her son [name], who transported her to the evaluation, that this practice provides psychological therapy and not psychiatric treatment. They expressed interest in counseling. We proceeded with the evaluation at this time and evaluator will reach out to referral source regarding this. She noted current stressors including her health, her sister's partial deafness, feelings of loneliness, and her husband's cognitive decline. She noted "I don't feel happy a lot" but noted wanting to feel more happy in the future. We reviewed her PHQ-9 which endorsed SI. She denied any SI during the session and noted feeling like a burden in the past and not wanting to be around but denied any action towards this. She noted improvement in her mood and attributed this due to her new medication regiment. We reviewed safety and Cindi was agreeable to following this protocol should the need arise. She and her son, separately, were provided printouts of the safety protocol and contact information.  Gabrelle discussed interest and identifying what therapy would be like.  Therapist provided psychoeducation regarding this.  Katalin expressed tentativeness to begin treatment and declined to schedule an appointment for follow-up.  Therapist encouraged Deepa to consider treatment, provided handout regarding safety which included therapist business card.  This information was also provided to her son who brought her to the appointment today.  If Avaiyah elects to engage in treatment, she can contact office via aforementioned business card to schedule an appointment and these instructions were  provided both her and her son.  Therapist discussed the benefits of counseling during the evaluation. Therapist answered  and all questions during the evaluation and contact information was provided.    Delight Ovens, LCSW

## 2023-03-30 ENCOUNTER — Inpatient Hospital Stay: Payer: Medicare Other | Attending: Hematology

## 2023-03-30 ENCOUNTER — Inpatient Hospital Stay (HOSPITAL_BASED_OUTPATIENT_CLINIC_OR_DEPARTMENT_OTHER): Payer: Medicare Other | Admitting: Hematology

## 2023-03-30 VITALS — BP 93/76 | HR 73 | Temp 97.7°F | Resp 18

## 2023-03-30 DIAGNOSIS — Z7901 Long term (current) use of anticoagulants: Secondary | ICD-10-CM | POA: Diagnosis not present

## 2023-03-30 DIAGNOSIS — Z79899 Other long term (current) drug therapy: Secondary | ICD-10-CM | POA: Insufficient documentation

## 2023-03-30 DIAGNOSIS — E876 Hypokalemia: Secondary | ICD-10-CM

## 2023-03-30 DIAGNOSIS — C9000 Multiple myeloma not having achieved remission: Secondary | ICD-10-CM

## 2023-03-30 DIAGNOSIS — G629 Polyneuropathy, unspecified: Secondary | ICD-10-CM | POA: Insufficient documentation

## 2023-03-30 DIAGNOSIS — Z86711 Personal history of pulmonary embolism: Secondary | ICD-10-CM | POA: Insufficient documentation

## 2023-03-30 DIAGNOSIS — D509 Iron deficiency anemia, unspecified: Secondary | ICD-10-CM

## 2023-03-30 MED ORDER — PROCHLORPERAZINE MALEATE 10 MG PO TABS
10.0000 mg | ORAL_TABLET | Freq: Once | ORAL | Status: AC
  Administered 2023-03-30: 10 mg via ORAL
  Filled 2023-03-30: qty 1

## 2023-03-30 MED ORDER — DEXAMETHASONE 4 MG PO TABS
20.0000 mg | ORAL_TABLET | Freq: Once | ORAL | Status: AC
  Administered 2023-03-30: 20 mg via ORAL
  Filled 2023-03-30: qty 5

## 2023-03-30 MED ORDER — POTASSIUM CHLORIDE CRYS ER 20 MEQ PO TBCR
40.0000 meq | EXTENDED_RELEASE_TABLET | Freq: Once | ORAL | Status: AC
  Administered 2023-03-30: 40 meq via ORAL
  Filled 2023-03-30: qty 2

## 2023-03-30 MED ORDER — DENOSUMAB 120 MG/1.7ML ~~LOC~~ SOLN
120.0000 mg | Freq: Once | SUBCUTANEOUS | Status: AC
  Administered 2023-03-30: 120 mg via SUBCUTANEOUS
  Filled 2023-03-30: qty 1.7

## 2023-03-30 MED ORDER — BORTEZOMIB CHEMO SQ INJECTION 3.5 MG (2.5MG/ML)
0.7000 mg/m2 | Freq: Once | INTRAMUSCULAR | Status: AC
  Administered 2023-03-30: 1.25 mg via SUBCUTANEOUS
  Filled 2023-03-30: qty 0.5

## 2023-03-30 NOTE — Patient Instructions (Addendum)
[facility] at [facility] Discharge Instructions   You were seen and examined today by Dr. Ellin Saba.  He reviewed the results of your lab work which are normal/stable.   We will proceed with your treatment today.   Continue to hold the Revlimid.  Return as scheduled.    Thank you for choosing [facility] at [facility] to provide your oncology and hematology care.  To afford each patient quality time with our provider, please arrive at least 15 minutes before your scheduled appointment time.   If you have a lab appointment with the Cancer Center please come in thru the Main Entrance and check in at the main information desk.  You need to re-schedule your appointment should you arrive 10 or more minutes late.  We strive to give you quality time with our providers, and arriving late affects you and other patients whose appointments are after yours.  Also, if you no show three or more times for appointments you may be dismissed from the clinic at the providers discretion.     Again, thank you for choosing [facility].  Our hope is that these requests will decrease the amount of time that you wait before being seen by our physicians.       _____________________________________________________________  Should you have questions after your visit to [facility], please contact our office at [phone] and follow the prompts.  Our office hours are 8:00 a.m. and 4:30 p.m. Monday - Friday.  Please note that voicemails left after 4:00 p.m. may not be returned until the following business day.  We are closed weekends and major holidays.  You do have access to a nurse 24-7, just call the main number to the clinic [phone] and do not press any options, hold on the line and a nurse will answer the phone.    For prescription refill requests, have your pharmacy contact our office and allow 72 hours.    Due to Covid, you will  need to wear a mask upon entering the hospital. If you do not have a mask, a mask will be given to you at the Main Entrance upon arrival. For doctor visits, patients may have 1 support person age 53 or older with them. For treatment visits, patients can not have anyone with them due to social distancing guidelines and our immunocompromised population.

## 2023-03-30 NOTE — Progress Notes (Signed)
Patient presents today for Velcade and Xgeva injections.  Patient is in satisfactory condition with no new complaints voiced.  Vital signs are stable.  Labs reviewed.  All labs are within treatment parameters.  Potassium today is 2.9.  We will give Klor Con 40 mEq PO x one dose today per standing orders by Dr. Ellin Saba.  Patient is taking calcium and vitamin D supplements daily.  We will proceed with injections per MD orders.    Patient tolerated injections well with no complaints voiced.  Patient left via wheelchair in stable condition.  Vital signs stable at discharge.  Follow up as scheduled.

## 2023-03-30 NOTE — Progress Notes (Signed)
OK to proceed with Courtney Rogers - dental cleared  Pryor Ochoa, PharmD

## 2023-03-30 NOTE — Progress Notes (Signed)
[facility] [address], [place] [address]    Clinic Day:  03/30/2023  Referring physician: Gilmore Laroche, FNP  Patient Care Team: Gilmore Laroche, FNP as PCP - General (Family Medicine) Doreatha Massed, MD as Medical Oncologist (Oncology) Mickie Bail, RN as Oncology Nurse Navigator (Oncology) Lanelle Bal, DO as Consulting Physician (Internal Medicine) Randa Spike, Kelton Pillar, LCSW as [facility] Care Management (Licensed Clinical Social Worker)   ASSESSMENT & PLAN:   Assessment: 1.  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  Increased number of atypical plasma cells present 36% of all cell lines.  Plasma cells are kappa light chain restricted. -Unfortunately her specimen has reached cytogenetics and FISH lab week later due to bad weather and no viable plasma cells. -PET scan on 07/09/2019 showed no findings of active myeloma. -24-hour urine shows total protein 59 mg.  Urine immunofixation shows IgA kappa monoclonal protein.  LDH is 184. -Based on [facility] criteria she has high risk with about 45-50% probability of progression to myeloma in the next 2 years. -CTAP on 01/07/2020 shows acute superior endplate burst fracture of L1.  Mild associated paravertebral soft tissue thickening.  No other bone abnormalities. -Bone density test on 02/07/2020 shows T score -0.5. - Bone marrow biopsy on 07/12/2021: Hypercellular with increased number of atypical plasma cells representing 30% of cells, displaying kappa light chain restriction. - FISH panel: Gain of 1 q. (high risk), T p53 deletion (high risk), monosomy 13 (standard risk) - Cytogenetics: Complex karyotype. - PET scan (06/24/2021): New hypermetabolic bone lesions involving left anterior iliac crest, left mid humerus, right superior pubic ramus, bilateral mid femurs, left calvarium.  Largest lytic lesion  in the left iliac crest measuring 2.9 x 2.3 cm. - 2D echo on 07/29/2021: EF 50-55%.  There is distal septal and inferior apical hypokinesis.  (Carfilzomib based regimen was put on hold due to echo findings) - Dara VRD 6 cycles from 08/10/2021 through 11/30/2021 - Myeloma panel 12/07/2021, DIRA test was negative, M spike 0.2 g and light chain ratio normal. - Maintenance Velcade every 2 weeks and Revlimid 3 weeks on 1 week off Started on 12/20/2021    2.  Social/family history: - She uses walker to ambulate since her right knee replacement leading to stiffness.  She lives with older sister and husband who has dementia. - She is independent of ADLs and IADLs.  She also drives     Plan: 1.  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  We have held Revlimid at last visit. - She was evaluated by psychology.  She is on Lexapro and BuSpar. - Today she is feeling better.  She denies any tingling or numbness in extremities.  However she reports that it feels like sand under her fingernails for the last 1 month. - Reviewed myeloma labs from 03/23/2023: M spike is better at 0.2 g.  FLC ratio is normal at 0.56. - CBC was grossly normal with platelet count 116.  Creatinine and calcium are normal. - She will restart her Velcade at 0.7 mg/m every 2 weeks.  We will continue to hold Revlimid at this time. - RTC 6 weeks for follow-up.   2.  Peripheral neuropathy: - She does not report any numbness or tingling.  Feels sandpaper under her finger nails.   3.  Pulmonary embolism (2015): -  Continue Coumadin indefinitely.  No bleeding issues.  4.  ID prophylaxis: - Continue acyclovir twice daily.  5.  Myeloma bone disease: - Calcium is 9.2.  She will start back her denosumab.  6.  Right knee pain: - She may continue hydrocodone 5/325 daily as needed.    No orders of the defined types were placed in this encounter.    Alben Deeds Teague,acting as a  Neurosurgeon for Doreatha Massed, MD.,have documented all relevant documentation on the behalf of Doreatha Massed, MD,as directed by  Doreatha Massed, MD while in the presence of Doreatha Massed, MD.  I, Doreatha Massed MD, have reviewed the above documentation for accuracy and completeness, and I agree with the above.      Doreatha Massed, MD   11/7/20245:07 PM  CHIEF COMPLAINT:   Diagnosis: multiple myeloma    Cancer Staging  No matching staging information was found for the patient.    Prior Therapy: Dara VRD 6 cycles completed on 11/30/2021   Current Therapy:  Maintenance Velcade every 2 weeks, and Revlimid 3 weeks on/1 week off    HISTORY OF PRESENT ILLNESS:   Oncology History  IgA myeloma (HCC)  05/06/2021 Initial Diagnosis   IgA myeloma (HCC)   08/10/2021 - 01/19/2022 Chemotherapy   Patient is on Treatment Plan : MYELOMA NEWLY DIAGNOSED TRANSPLANT CANDIDATE DaraVRd (Daratumumab SQ) q21d x 6 Cycles (Induction/Consolidation)     08/10/2021 -  Chemotherapy   Patient is on Treatment Plan : MYELOMA NEWLY DIAGNOSED TRANSPLANT CANDIDATE Velcade D1 and D 15 q 28 days        INTERVAL HISTORY:   Verneal is a 72 y.o. female presenting to clinic today for follow up of multiple myeloma. She was last seen by me on 03/02/23.  Since her last visit, she was seen in the ED on 03/03/23 for hypokalemia and was given IV and oral potassium.  Today, she states that she is doing well overall. Her appetite level is at 100%. Her energy level is at 25%. She reports difficulty sleeping and states she lies awake at night. She saw a benefit in seeing psychiatry, but has not decided if she will go again. She temporarily discontinued Revlimid, per last visit. She denies any tingling or numbness in the upper and lower extremities. She reports the feeling of sand underneath her fingernails, beginning 1 month ago. She has attempted soaking her hands in olive oil without improvement.    PAST MEDICAL HISTORY:   Past Medical History: Past Medical History:  Diagnosis Date   Acute myocardial infarction [facility]) 2009   CAD/no stent medically managed   Anxiety disorder    CAD (coronary artery disease)    Cancer (HCC)    multiple myeloma   Hyperlipidemia    Hypertension    IBS (irritable bowel syndrome)    Non-ulcer dyspepsia 04/30/2009   Qualifier: Diagnosis of  By: Darrick Penna MD, Sandi L    Overactive bladder    PE (pulmonary embolism) 10/2012   left lung   Presence of permanent cardiac pacemaker    Sleep apnea     Surgical History: Past Surgical History:  Procedure Laterality Date   ABDOMINAL HYSTERECTOMY     BSO secondary to cyst     CARDIAC CATHETERIZATION     COLONOSCOPY  12/2009   Dr. Aleene Davidson, propofol, normal. Next TCS 12/2019   COLONOSCOPY N/A 05/22/2017   Examined portion ileum was normal, significant looping of colon, external hemorrhoids and rectal bleeding due to internal hemorrhoids, mild diverticulosis procedure:  COLONOSCOPY;  Surgeon: West Bali, MD;  [facility];  Service: Endoscopy;  Laterality: N/A;  10:30am   ESOPHAGOGASTRODUODENOSCOPY  05/11/2009   schatzki ring/small hiatal hernia/path:gastritis   ESOPHAGOGASTRODUODENOSCOPY N/A 05/22/2017   Multiple gastric polyps with biopsy benign fundic gland polyp, small bowel biopsy negative for celiac, gastric biopsy with mild gastritis but no H. pylori procedure: ESOPHAGOGASTRODUODENOSCOPY (EGD);  Surgeon: West Bali, MD;  Location: [facility];  Service: Endoscopy;  Laterality: N/A;   ESOPHAGOGASTRODUODENOSCOPY (EGD) WITH ESOPHAGEAL DILATION N/A 04/01/2013   [id number] at the gastroesophageal juction/multiple small polyps/mild gastritis   GIVENS CAPSULE STUDY N/A 06/07/2017   Frequent gastric erosions, normal small bowel mucosa.  Procedure: GIVENS CAPSULE STUDY;  Surgeon: West Bali, MD;  Location: [facility];  Service: Endoscopy;  Laterality: N/A;  7:30am   INSERT /  REPLACE / REMOVE PACEMAKER     Last year per pt.(can't remember date)   REPLACEMENT TOTAL KNEE Right 08/2020    Social History: Social History   Socioeconomic History   Marital status: Married    Spouse name: Doctor, general practice   Number of children: 3   Years of education: 16   Highest education level: Master's degree (e.g., MA, MS, MEng, MEd, MSW, MBA)  Occupational History   Not on file  Tobacco Use   Smoking status: Never    Passive exposure: Never   Smokeless tobacco: Never   Tobacco comments:    Never smoked   Vaping Use   Vaping status: Never Used  Substance and Sexual Activity   Alcohol use: Not Currently    Comment: occ wine   Drug use: No   Sexual activity: Not Currently  Other Topics Concern   Not on file  Social History Narrative      Lives with husband-51 years 950 in Aug 2021   Daughter is close by, 2 sons live further away   One IN [place],ONE IN [place], ONE BESIDE HER.        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   Enjoys: reading, young adult      Diet: eats all food groups    Caffeine: coffee 1, tea daily soda-1 daily   Water: 1-2 cups      Wears seat belt    Does not use phone while driving   Smoke detectors at home    Licensed conveyancer -locked up      Left handed   One story home   Drinks caffeine   Social Determinants of Health   Financial Resource Strain: Low Risk  (03/17/2023)   Overall Financial Resource Strain (CARDIA)    Difficulty of Paying Living Expenses: Not hard at all  Food Insecurity: No Food Insecurity (03/17/2023)   Hunger Vital Sign    Worried About Running Out of Food in the Last Year: Never true    Ran Out of Food in the Last Year: Never true  Transportation Needs: No Transportation Needs (03/17/2023)   PRAPARE - Administrator, Civil Service (Medical): No    Lack of Transportation (Non-Medical): No  Physical Activity: Insufficiently Active (03/17/2023)   Exercise Vital Sign    Days of  Exercise per Week: 1 day    Minutes of Exercise per Session: 10 min  Stress: Stress Concern Present (03/17/2023)   [employer] of Occupational Health - Occupational Stress Questionnaire    Feeling of Stress : Very much  Social Connections: Socially Integrated (03/17/2023)   Social Connection  and Isolation Panel [NHANES]    Frequency of Communication with Friends and Family: More than three times a week    Frequency of Social Gatherings with Friends and Family: Never    Attends Religious Services: 1 to 4 times per year    Active Member of [employer] or Organizations: Yes    Attends Banker Meetings: 1 to 4 times per year    Marital Status: Married  Catering manager Violence: Not At Risk (12/21/2022)   Humiliation, Afraid, Rape, and Kick questionnaire    Fear of Current or Ex-Partner: No    Emotionally Abused: No    Physically Abused: No    Sexually Abused: No    Family History: Family History  Problem Relation Age of Onset   Colon polyps Neg Hx    Colon cancer Neg Hx     Current Medications:  Current Outpatient Medications:    acetaminophen (TYLENOL) 500 MG tablet, Take 1,000 mg by mouth 2 (two) times daily as needed for moderate pain or headache., Disp: , Rfl:    acyclovir (ZOVIRAX) 400 MG tablet, TAKE ONE TABLET BY MOUTH TWICE DAILY, Disp: 60 tablet, Rfl: 6   amLODipine (NORVASC) 10 MG tablet, TAKE ONE TABLET BY MOUTH ONCE DAILY, Disp: 90 tablet, Rfl: 0   ascorbic acid (VITAMIN C) 500 MG tablet, Take 500 mg by mouth daily., Disp: , Rfl:    aspirin EC 81 MG tablet, Take 81 mg by mouth at bedtime., Disp: , Rfl:    B Complex Vitamins (VITAMIN B COMPLEX) TABS, Take 1 tablet by mouth daily., Disp: , Rfl:    B Complex-Folic Acid (B COMPLEX VITAMINS, W/ FA,) CAPS, Take 1 capsule by mouth daily., Disp: , Rfl:    bortezomib SQ (VELCADE) 3.5 MG injection, Inject into the skin once., Disp: , Rfl:    busPIRone (BUSPAR) 5 MG tablet, Take 5 mg by mouth daily., Disp: , Rfl:     Calcium Carbonate-Vitamin D (CALCIUM 600+D PO), Take 1 tablet by mouth 2 (two) times daily., Disp: , Rfl:    chlorthalidone (HYGROTON) 25 MG tablet, Take 25 mg by mouth daily., Disp: , Rfl:    cholecalciferol (VITAMIN D3) 10 MCG (400 UNIT) TABS tablet, Take 400 Units by mouth daily., Disp: , Rfl:    cyanocobalamin (VITAMIN B12) 1000 MCG tablet, Take by mouth., Disp: , Rfl:    diclofenac Sodium (VOLTAREN) 1 % GEL, Apply 4 g topically 4 (four) times daily., Disp: 50 g, Rfl: 0   fluticasone (FLONASE) 50 MCG/ACT nasal spray, USE 2 SPRAYS IN EACH NOSTRIL ONCE DAILY, Disp: 16 g, Rfl: 0   fluticasone-salmeterol (ADVAIR) 250-50 MCG/ACT AEPB, Inhale into the lungs., Disp: , Rfl:    folic acid (FOLVITE) 1 MG tablet, TAKE ONE TABLET BY MOUTH EVERY DAY, Disp: 30 tablet, Rfl: 11   gabapentin (NEURONTIN) 100 MG capsule, Take 100 mg by mouth 3 (three) times daily., Disp: , Rfl:    HYDROcodone-acetaminophen (NORCO/VICODIN) 5-325 MG tablet, TAKE ONE TABLET BY MOUTH EVERY 8 HOURS AS NEEDED FOR MODERATE PAIN, Disp: 60 tablet, Rfl: 0   hydrOXYzine (VISTARIL) 25 MG capsule, Take 1 capsule (25 mg total) by mouth at bedtime as needed., Disp: 30 capsule, Rfl: 0   Hyoscyamine Sulfate SL 0.125 MG SUBL, , Disp: , Rfl:    lenalidomide (REVLIMID) 10 MG capsule, Take 1 capsule (10 mg total) by mouth daily. 21 days on, 7 days off every 28 days, Disp: 21 capsule, Rfl: 0   levocetirizine (XYZAL) 5 MG  tablet, TAKE ONE TABLET BY MOUTH IN THE EVENING, Disp: 30 tablet, Rfl: 1   lidocaine (HM LIDOCAINE PATCH) 4 %, Place 1 patch onto the skin daily., Disp: 14 patch, Rfl: 0   losartan (COZAAR) 100 MG tablet, TAKE ONE TABLET BY MOUTH ONCE DAILY, Disp: 30 tablet, Rfl: 3   magnesium oxide (MAG-OX) 400 (240 Mg) MG tablet, Take 1 tablet (400 mg total) by mouth 2 (two) times daily., Disp: 60 tablet, Rfl: 4   metoprolol tartrate (LOPRESSOR) 50 MG tablet, Take 1 tablet (50 mg total) by mouth 2 (two) times daily., Disp: 180 tablet, Rfl: 1    montelukast (SINGULAIR) 10 MG tablet, TAKE ONE TABLET BY MOUTH AT BEDTIME, Disp: 30 tablet, Rfl: 3   MYRBETRIQ 50 MG TB24 tablet, TAKE ONE TABLET BY MOUTH ONCE DAILY, Disp: 30 tablet, Rfl: 3   nitroGLYCERIN (NITROSTAT) 0.4 MG SL tablet, Place 0.4 mg under the tongue every 5 (five) minutes as needed for chest pain., Disp: , Rfl:    pantoprazole (PROTONIX) 40 MG tablet, Take 1 tablet (40 mg total) by mouth 2 (two) times daily., Disp: 90 tablet, Rfl: 1   potassium chloride SA (KLOR-CON M) 20 MEQ tablet, Take 1 tablet (20 mEq total) by mouth 2 (two) times daily., Disp: , Rfl:    primidone (MYSOLINE) 50 MG tablet, Take by mouth., Disp: , Rfl:    Probiotic Product (GNP PROBIOTIC COLON SUPPORT) CAPS, Take 1 capsule by mouth daily., Disp: , Rfl:    Saccharomyces boulardii (PROBIOTIC) 250 MG CAPS, Take 1 capsule by mouth daily., Disp: , Rfl:    sertraline (ZOLOFT) 25 MG tablet, TAKE ONE TABLET BY MOUTH ONCE DAILY, Disp: 30 tablet, Rfl: 3   simvastatin (ZOCOR) 40 MG tablet, TAKE ONE TABLET BY MOUTH AT BEDTIME, Disp: 90 tablet, Rfl: 1   tadalafil, PAH, (ALYQ) 20 MG tablet, Take 2 tablets every day by oral route., Disp: , Rfl:    topiramate (TOPAMAX) 25 MG tablet, Take 25 mg by mouth 2 (two) times daily., Disp: , Rfl:    warfarin (COUMADIN) 3 MG tablet, Take 3 mg by mouth daily., Disp: , Rfl:    Allergies: Allergies  Allergen Reactions   Amoxicillin-Pot Clavulanate Other (See Comments)   Clarithromycin Other (See Comments)    Stomach problems   Erythromycin    Lisinopril Swelling   Amoxicillin Diarrhea    REVIEW OF SYSTEMS:   Review of Systems  Constitutional:  Positive for fatigue. Negative for chills and fever.  HENT:   Negative for lump/mass, mouth sores, nosebleeds, sore throat and trouble swallowing.   Eyes:  Negative for eye problems.  Respiratory:  Negative for cough and shortness of breath.   Cardiovascular:  Negative for chest pain, leg swelling and palpitations.  Gastrointestinal:   Negative for abdominal pain, constipation, diarrhea, nausea and vomiting.  Genitourinary:  Negative for bladder incontinence, difficulty urinating, dysuria, frequency, hematuria and nocturia.   Musculoskeletal:  Negative for arthralgias, back pain, flank pain, myalgias and neck pain.       +shooting left arm pain, 10/10 severity  Skin:  Negative for itching and rash.  Neurological:  Positive for headaches and numbness (in hands and feet). Negative for dizziness.  Hematological:  Does not bruise/bleed easily.  Psychiatric/Behavioral:  Positive for sleep disturbance. Negative for depression and suicidal ideas. The patient is not nervous/anxious.   All other systems reviewed and are negative.    VITALS:   There were no vitals taken for this visit.  Wt Readings from  Last 3 Encounters:  03/21/23 145 lb (65.8 kg)  03/03/23 145 lb (65.8 kg)  03/02/23 145 lb (65.8 kg)    There is no height or weight on file to calculate BMI.  Performance status (ECOG): 1 - Symptomatic but completely ambulatory  PHYSICAL EXAM:   Physical Exam Vitals and nursing note reviewed. Exam conducted with a chaperone present.  Constitutional:      Appearance: Normal appearance.  Cardiovascular:     Rate and Rhythm: Normal rate and regular rhythm.     Pulses: Normal pulses.     Heart sounds: Normal heart sounds.  Pulmonary:     Effort: Pulmonary effort is normal.     Breath sounds: Normal breath sounds.  Abdominal:     Palpations: Abdomen is soft. There is no hepatomegaly, splenomegaly or mass.     Tenderness: There is no abdominal tenderness.  Musculoskeletal:     Right lower leg: No edema.     Left lower leg: No edema.  Lymphadenopathy:     Cervical: No cervical adenopathy.     Right cervical: No superficial, deep or posterior cervical adenopathy.    Left cervical: No superficial, deep or posterior cervical adenopathy.     Upper Body:     Right upper body: No supraclavicular or axillary adenopathy.      Left upper body: No supraclavicular or axillary adenopathy.  Neurological:     General: No focal deficit present.     Mental Status: She is alert and oriented to person, place, and time.  Psychiatric:        Mood and Affect: Mood normal.        Behavior: Behavior normal.     LABS:      Latest Ref Rng & Units 03/23/2023    9:26 AM 03/21/2023   11:15 AM 03/03/2023    8:05 PM  CBC  WBC 4.0 - 10.5 K/uL 4.2  3.9  3.5   Hemoglobin 12.0 - 15.0 g/dL 16.1  09.6  04.5   Hematocrit 36.0 - 46.0 % 34.7  33.7  34.7   Platelets 150 - 400 K/uL 116  143  124       Latest Ref Rng & Units 03/23/2023    9:26 AM 03/21/2023   11:15 AM 03/03/2023    8:05 PM  CMP  Glucose 70 - 99 mg/dL 409  811  914   BUN 8 - 23 mg/dL 23  20  14    Creatinine 0.44 - 1.00 mg/dL 7.82  9.56  2.13   Sodium 135 - 145 mmol/L 137  141  131   Potassium 3.5 - 5.1 mmol/L 2.9  3.1  2.9   Chloride 98 - 111 mmol/L 99  103  98   CO2 22 - 32 mmol/L 26  24  26    Calcium 8.9 - 10.3 mg/dL 9.2  9.2  8.5   Total Protein 6.5 - 8.1 g/dL 6.7   6.4   Total Bilirubin 0.3 - 1.2 mg/dL 0.5   0.7   Alkaline Phos 38 - 126 U/L 44   51   AST 15 - 41 U/L 23   20   ALT 0 - 44 U/L 19   16      No results found for: "CEA1", "CEA" / No results found for: "CEA1", "CEA" No results found for: "PSA1" No results found for: "YQM578" No results found for: "CAN125"  Lab Results  Component Value Date   TOTALPROTELP 6.3 03/23/2023   ALBUMINELP 3.8  03/23/2023   A1GS 0.3 03/23/2023   A2GS 0.6 03/23/2023   BETS 1.0 03/23/2023   GAMS 0.6 03/23/2023   MSPIKE 0.2 (H) 03/23/2023   SPEI Comment 03/23/2023   Lab Results  Component Value Date   TIBC 291 06/09/2021   TIBC 297 01/21/2021   TIBC 302 10/21/2020   FERRITIN 279 06/09/2021   FERRITIN 216 01/21/2021   FERRITIN 274 10/21/2020   IRONPCTSAT 24 06/09/2021   IRONPCTSAT 19 01/21/2021   IRONPCTSAT 28 10/21/2020   Lab Results  Component Value Date   LDH 129 03/02/2022   LDH 121 12/14/2021    LDH 113 11/09/2021     STUDIES:   No results found.

## 2023-03-30 NOTE — Patient Instructions (Signed)
[facility] - A DEPT OF [facility]  Discharge Instructions: Thank you for choosing [facility] to provide your oncology and hematology care.  If you have a lab appointment with the Cancer Center - please note that after April 8th, 2024, all labs will be drawn in the cancer center.  You do not have to check in or register with the main entrance as you have in the past but will complete your check-in in the cancer center.  Wear comfortable clothing and clothing appropriate for easy access to any Portacath or PICC line.   We strive to give you quality time with your provider. You may need to reschedule your appointment if you arrive late (15 or more minutes).  Arriving late affects you and other patients whose appointments are after yours.  Also, if you miss three or more appointments without notifying the office, you may be dismissed from the clinic at the provider's discretion.      For prescription refill requests, have your pharmacy contact our office and allow 72 hours for refills to be completed.    Today you received the following chemotherapy and/or immunotherapy agents Xgeva/Velcade.  Bortezomib Injection What is this medication? BORTEZOMIB (bor TEZ oh mib) treats lymphoma. It may also be used to treat multiple myeloma, a type of bone marrow cancer. It works by blocking a protein that causes cancer cells to grow and multiply. This helps to slow or stop the spread of cancer cells. This medicine may be used for other purposes; ask your health care provider or pharmacist if you have questions. COMMON BRAND NAME(S): Velcade What should I tell my care team before I take this medication? They need to know if you have any of these conditions: Dehydration Diabetes Heart disease Liver disease Tingling of the fingers or toes or other nerve disorder An unusual or allergic reaction to bortezomib, other medications, foods, dyes, or preservatives If  you or your partner are pregnant or trying to get pregnant Breastfeeding How should I use this medication? This medication is injected into a vein or under the skin. It is given by your care team in a hospital or clinic setting. Talk to your care team about the use of this medication in children. Special care may be needed. Overdosage: If you think you have taken too much of this medicine contact a poison control center or emergency room at once. NOTE: This medicine is only for you. Do not share this medicine with others. What if I miss a dose? Keep appointments for follow-up doses. It is important not to miss your dose. Call your care team if you are unable to keep an appointment. What may interact with this medication? Ketoconazole Rifampin This list may not describe all possible interactions. Give your health care provider a list of all the medicines, herbs, non-prescription drugs, or dietary supplements you use. Also tell them if you smoke, drink alcohol, or use illegal drugs. Some items may interact with your medicine. What should I watch for while using this medication? Your condition will be monitored carefully while you are receiving this medication. You may need blood work while taking this medication. This medication may affect your coordination, reaction time, or judgment. Do not drive or operate machinery until you know how this medication affects you. Sit up or stand slowly to reduce the risk of dizzy or fainting spells. Drinking alcohol with this medication can increase the risk of these side effects. This medication  may increase your risk of getting an infection. Call your care team for advice if you get a fever, chills, sore throat, or other symptoms of a cold or flu. Do not treat yourself. Try to avoid being around people who are sick. Check with your care team if you have severe diarrhea, nausea, and vomiting, or if you sweat a lot. The loss of too much body fluid may make it  dangerous for you to take this medication. Talk to your care team if you may be pregnant. Serious birth defects can occur if you take this medication during pregnancy and for 7 months after the last dose. You will need a negative pregnancy test before starting this medication. Contraception is recommended while taking this medication and for 7 months after the last dose. Your care team can help you find the option that works for you. If your partner can get pregnant, use a condom during sex while taking this medication and for 4 months after the last dose. Do not breastfeed while taking this medication and for 2 months after the last dose. This medication may cause infertility. Talk to your care team if you are concerned about your fertility. What side effects may I notice from receiving this medication? Side effects that you should report to your care team as soon as possible: Allergic reactions--skin rash, itching, hives, swelling of the face, lips, tongue, or throat Bleeding--bloody or black, tar-like stools, vomiting blood or Kyair Ditommaso material that looks like coffee grounds, red or dark Ash Mcelwain urine, small red or purple spots on skin, unusual bruising or bleeding Bleeding in the brain--severe headache, stiff neck, confusion, dizziness, change in vision, numbness or weakness of the face, arm, or leg, trouble speaking, trouble walking, vomiting Bowel blockage--stomach cramping, unable to have a bowel movement or pass gas, loss of appetite, vomiting Heart failure--shortness of breath, swelling of the ankles, feet, or hands, sudden weight gain, unusual weakness or fatigue Infection--fever, chills, cough, sore throat, wounds that don't heal, pain or trouble when passing urine, general feeling of discomfort or being unwell Liver injury--right upper belly pain, loss of appetite, nausea, light-colored stool, dark yellow or Tameem Pullara urine, yellowing skin or eyes, unusual weakness or fatigue Low blood  pressure--dizziness, feeling faint or lightheaded, blurry vision Lung injury--shortness of breath or trouble breathing, cough, spitting up blood, chest pain, fever Pain, tingling, or numbness in the hands or feet Severe or prolonged diarrhea Stomach pain, bloody diarrhea, pale skin, unusual weakness or fatigue, decrease in the amount of urine, which may be signs of hemolytic uremic syndrome Sudden and severe headache, confusion, change in vision, seizures, which may be signs of posterior reversible encephalopathy syndrome (PRES) TTP--purple spots on the skin or inside the mouth, pale skin, yellowing skin or eyes, unusual weakness or fatigue, fever, fast or irregular heartbeat, confusion, change in vision, trouble speaking, trouble walking Tumor lysis syndrome (TLS)--nausea, vomiting, diarrhea, decrease in the amount of urine, dark urine, unusual weakness or fatigue, confusion, muscle pain or cramps, fast or irregular heartbeat, joint pain Side effects that usually do not require medical attention (report to your care team if they continue or are bothersome): Constipation Diarrhea Fatigue Loss of appetite Nausea This list may not describe all possible side effects. Call your doctor for medical advice about side effects. You may report side effects to FDA at 1-800-FDA-1088. Where should I keep my medication? This medication is given in a hospital or clinic. It will not be stored at home. NOTE: This sheet is a  summary. It may not cover all possible information. If you have questions about this medicine, talk to your doctor, pharmacist, or health care provider.  2024 Elsevier/Gold Standard (2021-10-12 00:00:00)    Denosumab Injection (Oncology) What is this medication? DENOSUMAB (den oh SUE mab) prevents weakened bones caused by cancer. It may also be used to treat noncancerous bone tumors that cannot be removed by surgery. It can also be used to treat high calcium levels in the blood caused by  cancer. It works by blocking a protein that causes bones to break down quickly. This slows down the release of calcium from bones, which lowers calcium levels in your blood. It also makes your bones stronger and less likely to break (fracture). This medicine may be used for other purposes; ask your health care provider or pharmacist if you have questions. COMMON BRAND NAME(S): XGEVA What should I tell my care team before I take this medication? They need to know if you have any of these conditions: Dental disease Having surgery or tooth extraction Infection Kidney disease Low levels of calcium or vitamin D in the blood Malnutrition On hemodialysis Skin conditions or sensitivity Thyroid or parathyroid disease An unusual reaction to denosumab, other medications, foods, dyes, or preservatives Pregnant or trying to get pregnant Breast-feeding How should I use this medication? This medication is for injection under the skin. It is given by your care team in a hospital or clinic setting. A special MedGuide will be given to you before each treatment. Be sure to read this information carefully each time. Talk to your care team about the use of this medication in children. While it may be prescribed for children as young as 13 years for selected conditions, precautions do apply. Overdosage: If you think you have taken too much of this medicine contact a poison control center or emergency room at once. NOTE: This medicine is only for you. Do not share this medicine with others. What if I miss a dose? Keep appointments for follow-up doses. It is important not to miss your dose. Call your care team if you are unable to keep an appointment. What may interact with this medication? Do not take this medication with any of the following: Other medications containing denosumab This medication may also interact with the following: Medications that lower your chance of fighting infection Steroid  medications, such as prednisone or cortisone This list may not describe all possible interactions. Give your health care provider a list of all the medicines, herbs, non-prescription drugs, or dietary supplements you use. Also tell them if you smoke, drink alcohol, or use illegal drugs. Some items may interact with your medicine. What should I watch for while using this medication? Your condition will be monitored carefully while you are receiving this medication. You may need blood work while taking this medication. This medication may increase your risk of getting an infection. Call your care team for advice if you get a fever, chills, sore throat, or other symptoms of a cold or flu. Do not treat yourself. Try to avoid being around people who are sick. You should make sure you get enough calcium and vitamin D while you are taking this medication, unless your care team tells you not to. Discuss the foods you eat and the vitamins you take with your care team. Some people who take this medication have severe bone, joint, or muscle pain. This medication may also increase your risk for jaw problems or a broken thigh bone. Tell your care  team right away if you have severe pain in your jaw, bones, joints, or muscles. Tell your care team if you have any pain that does not go away or that gets worse. Talk to your care team if you may be pregnant. Serious birth defects can occur if you take this medication during pregnancy and for 5 months after the last dose. You will need a negative pregnancy test before starting this medication. Contraception is recommended while taking this medication and for 5 months after the last dose. Your care team can help you find the option that works for you. What side effects may I notice from receiving this medication? Side effects that you should report to your care team as soon as possible: Allergic reactions--skin rash, itching, hives, swelling of the face, lips, tongue, or  throat Bone, joint, or muscle pain Low calcium level--muscle pain or cramps, confusion, tingling, or numbness in the hands or feet Osteonecrosis of the jaw--pain, swelling, or redness in the mouth, numbness of the jaw, poor healing after dental work, unusual discharge from the mouth, visible bones in the mouth Side effects that usually do not require medical attention (report to your care team if they continue or are bothersome): Cough Diarrhea Fatigue Headache Nausea This list may not describe all possible side effects. Call your doctor for medical advice about side effects. You may report side effects to FDA at 1-800-FDA-1088. Where should I keep my medication? This medication is given in a hospital or clinic. It will not be stored at home. NOTE: This sheet is a summary. It may not cover all possible information. If you have questions about this medicine, talk to your doctor, pharmacist, or health care provider.  2024 Elsevier/Gold Standard (2021-09-29 00:00:00)        To help prevent nausea and vomiting after your treatment, we encourage you to take your nausea medication as directed.  BELOW ARE SYMPTOMS THAT SHOULD BE REPORTED IMMEDIATELY: *FEVER GREATER THAN 100.4 F (38 C) OR HIGHER *CHILLS OR SWEATING *NAUSEA AND VOMITING THAT IS NOT CONTROLLED WITH YOUR NAUSEA MEDICATION *UNUSUAL SHORTNESS OF BREATH *UNUSUAL BRUISING OR BLEEDING *URINARY PROBLEMS (pain or burning when urinating, or frequent urination) *BOWEL PROBLEMS (unusual diarrhea, constipation, pain near the anus) TENDERNESS IN MOUTH AND THROAT WITH OR WITHOUT PRESENCE OF ULCERS (sore throat, sores in mouth, or a toothache) UNUSUAL RASH, SWELLING OR PAIN  UNUSUAL VAGINAL DISCHARGE OR ITCHING   Items with * indicate a potential emergency and should be followed up as soon as possible or go to the Emergency Department if any problems should occur.  Please show the CHEMOTHERAPY ALERT CARD or IMMUNOTHERAPY ALERT CARD at  check-in to the Emergency Department and triage nurse.  Should you have questions after your visit or need to cancel or reschedule your appointment, please contact [facility] - A DEPT OF [facility] [phone]  and follow the prompts.  Office hours are 8:00 a.m. to 4:30 p.m. Monday - Friday. Please note that voicemails left after 4:00 p.m. may not be returned until the following business day.  We are closed weekends and major holidays. You have access to a nurse at all times for urgent questions. Please call the main number to the clinic [phone] and follow the prompts.  For any non-urgent questions, you may also contact your provider using MyChart. We now offer e-Visits for anyone 20 and older to request care online for non-urgent symptoms. For details visit [email or web address].   Also download the  MyChart app! Go to the app store, search "MyChart", open the app, select [facility], and log in with your MyChart username and password.

## 2023-03-30 NOTE — Progress Notes (Signed)
Patient has been examined by Dr. Ellin Saba. Vital signs and labs from 03/23/2023 have been reviewed by MD - ANC, Creatinine, LFTs, hemoglobin, and platelets are within treatment parameters per M.D. - pt may proceed with treatment.  Primary RN and pharmacy notified.

## 2023-04-06 ENCOUNTER — Other Ambulatory Visit: Payer: Self-pay | Admitting: Hematology

## 2023-04-10 ENCOUNTER — Other Ambulatory Visit: Payer: Self-pay | Admitting: Family Medicine

## 2023-04-10 ENCOUNTER — Encounter: Payer: Self-pay | Admitting: Family Medicine

## 2023-04-10 DIAGNOSIS — E538 Deficiency of other specified B group vitamins: Secondary | ICD-10-CM

## 2023-04-12 ENCOUNTER — Emergency Department (HOSPITAL_COMMUNITY)
Admission: EM | Admit: 2023-04-12 | Discharge: 2023-04-12 | Disposition: A | Discharge status: Home / Self Care | Payer: BC Managed Care – PPO | Attending: Emergency Medicine | Admitting: Emergency Medicine

## 2023-04-12 ENCOUNTER — Emergency Department (HOSPITAL_COMMUNITY): Payer: BC Managed Care – PPO

## 2023-04-12 ENCOUNTER — Other Ambulatory Visit: Payer: Self-pay

## 2023-04-12 ENCOUNTER — Encounter (HOSPITAL_COMMUNITY): Payer: Self-pay

## 2023-04-12 DIAGNOSIS — Z79899 Other long term (current) drug therapy: Secondary | ICD-10-CM | POA: Diagnosis not present

## 2023-04-12 DIAGNOSIS — Z7982 Long term (current) use of aspirin: Secondary | ICD-10-CM | POA: Diagnosis not present

## 2023-04-12 DIAGNOSIS — I10 Essential (primary) hypertension: Secondary | ICD-10-CM | POA: Diagnosis not present

## 2023-04-12 DIAGNOSIS — I2510 Atherosclerotic heart disease of native coronary artery without angina pectoris: Secondary | ICD-10-CM | POA: Diagnosis not present

## 2023-04-12 DIAGNOSIS — S20212A Contusion of left front wall of thorax, initial encounter: Secondary | ICD-10-CM | POA: Insufficient documentation

## 2023-04-12 DIAGNOSIS — W228XXA Striking against or struck by other objects, initial encounter: Secondary | ICD-10-CM | POA: Diagnosis not present

## 2023-04-12 DIAGNOSIS — R0781 Pleurodynia: Secondary | ICD-10-CM | POA: Diagnosis present

## 2023-04-12 MED ORDER — LIDOCAINE 4 % EX PTCH: 1.0000 | MEDICATED_PATCH | CUTANEOUS | 0 refills | Status: DC

## 2023-04-12 MED ORDER — LIDOCAINE 5 % EX PTCH
1.0000 | MEDICATED_PATCH | CUTANEOUS | Status: DC
  Administered 2023-04-12: 1 via TRANSDERMAL
  Filled 2023-04-12: qty 1

## 2023-04-12 NOTE — ED Triage Notes (Addendum)
Pt to er, pt states that she fell about three nights ago, pt c/o L arm pain and rib pain.  Pt answering questions appropriately, pt oriented to person place and day of the week.

## 2023-04-12 NOTE — ED Provider Notes (Signed)
[place] [facility]ERGENC[facility] DE[facility]MENT AT [facility] Provider Note   CSN: [id number] Arrival date & time: 04/12/23  1129     History  Chief Complaint  Patient presents with   Courtney Rogers is a 72 y.o. female on Coumadin who presents for complaints of left rib pain following a mechanical fall 4 days ago.  Patient states she fell against a table on her left side.  Rib pain appears to be worse with inhaling.  She did not hit her head or lose consciousness. She is accompanied by her son who reports that she has been behaving normally since a reported fall.  No noticeable difficulty ambulating or vomiting.  This was a witnessed fall by her husband.  She denies preceding dizziness, chest pain, shortness of breath following the fall.  Denies any extremity pain.  Denies any hematuria.  He follows with a cardiologist regularly to monitor her INR levels.  Last checked 2 weeks ago, reportedly within normal limits.   Fall      Past Medical History:  Diagnosis Date   Acute myocardial infarction [facility]) 2009   CAD/no stent medically managed   Anxiety disorder    CAD (coronary artery disease)    Cancer (HCC)    multiple myeloma   Hyperlipidemia    Hypertension    IBS (irritable bowel syndrome)    Non-ulcer dyspepsia 04/30/2009   Qualifier: Diagnosis of  By: Darrick Penna MD, Sandi L    Overactive bladder    PE (pulmonary embolism) 10/2012   left lung   Presence of permanent cardiac pacemaker    Sleep apnea      Home Medications Prior to Admission medications   Medication Sig Start Date End Date Taking? Authorizing Provider  acetaminophen (TYLENOL) 500 MG tablet Take 1,000 mg by mouth 2 (two) times daily as needed for moderate pain or headache.    [provider]  acyclovir (ZOVIRAX) 400 MG tablet TAKE ONE TABLET BY MOUTH TWICE DAILY 12/20/22   Doreatha Massed, MD  amLODipine (NORVASC) 10 MG tablet TAKE ONE TABLET BY MOUTH ONCE DAILY 04/06/23   Doreatha Massed, MD  ascorbic acid (VITAMIN C) 500 MG tablet Take 500 mg by mouth daily. 02/07/22   [provider]  aspirin EC 81 MG tablet Take 81 mg by mouth at bedtime.    [provider]  B Complex Vitamins (VITAMIN B COMPLEX) TABS Take 1 tablet by mouth daily.    [provider]  B Complex-Folic Acid (B COMPLEX VITAMINS, W/ FA,) CAPS Take 1 capsule by mouth daily. 09/09/21   [provider]  bortezomib SQ (VELCADE) 3.5 MG injection Inject into the skin once. 08/10/21   [provider]  busPIRone (BUSPAR) 5 MG tablet Take 5 mg by mouth daily.    Elam Dutch, MD  Calcium Carbonate-Vitamin D (CALCIUM 600+D PO) Take 1 tablet by mouth 2 (two) times daily.    [provider]  chlorthalidone (HYGROTON) 25 MG tablet Take 25 mg by mouth daily. 06/04/22   [provider]  cholecalciferol (VITAMIN D3) 10 MCG (400 UNIT) TABS tablet Take 400 Units by mouth daily. 09/30/22   [provider]  cyanocobalamin (VITAMIN B12) 1000 MCG tablet Take by mouth. 10/12/20   [provider]  diclofenac Sodium (VOLTAREN) 1 % GEL Apply 4 g topically 4 (four) times daily. 11/19/21   Gilmore Laroche, FNP  fluticasone (FLONASE) 50 MCG/ACT nasal spray USE 2 SPRAYS IN EACH NOSTRIL ONCE DAILY  11/28/22   Gilmore Laroche, FNP  fluticasone-salmeterol (ADVAIR) 250-50 MCG/ACT AEPB Inhale into the lungs. 10/12/20   [provider]  folic acid (FOLVITE) 1 MG tablet TAKE ONE TABLET BY MOUTH EVERY DAY 03/17/23   Doreatha Massed, MD  gabapentin (NEURONTIN) 100 MG capsule Take 100 mg by mouth 3 (three) times daily. 05/07/19   [provider]  HYDROcodone-acetaminophen (NORCO/VICODIN) 5-325 MG tablet TAKE ONE TABLET BY MOUTH EVERY 8 HOURS AS NEEDED FOR MODERATE PAIN 10/10/22   Doreatha Massed, MD  hydrOXYzine (VISTARIL) 25 MG capsule Take 1 capsule (25 mg total) by mouth at bedtime as needed. 02/24/23   Gilmore Laroche, FNP  Hyoscyamine Sulfate SL  0.125 MG SUBL     [provider]  lenalidomide (REVLIMID) 10 MG capsule Take 1 capsule (10 mg total) by mouth daily. 21 days on, 7 days off every 28 days 02/14/23   Doreatha Massed, MD  levocetirizine (XYZAL) 5 MG tablet TAKE ONE TABLET BY MOUTH IN THE EVENING 03/07/23   Gilmore Laroche, FNP  lidocaine (HM LIDOCAINE PATCH) 4 % Place 1 patch onto the skin daily. 04/12/23   Halford Decamp, PA-C  losartan (COZAAR) 100 MG tablet TAKE ONE TABLET BY MOUTH ONCE DAILY 01/26/23   Gilmore Laroche, FNP  magnesium oxide (MAG-OX) 400 (240 Mg) MG tablet TAKE ONE TABLET BY MOUTH TWICE DAILY 04/06/23   Doreatha Massed, MD  metoprolol tartrate (LOPRESSOR) 50 MG tablet Take 1 tablet (50 mg total) by mouth 2 (two) times daily. 10/07/21   [name], FNP  montelukast (SINGULAIR) 10 MG tablet TAKE ONE TABLET BY MOUTH AT BEDTIME 02/10/23   Gilmore Laroche, FNP  MYRBETRIQ 50 MG TB24 tablet TAKE ONE TABLET BY MOUTH ONCE DAILY 02/10/23   Gilmore Laroche, FNP  nitroGLYCERIN (NITROSTAT) 0.4 MG SL tablet Place 0.4 mg under the tongue every 5 (five) minutes as needed for chest pain.    [provider]  pantoprazole (PROTONIX) 40 MG tablet Take 1 tablet (40 mg total) by mouth 2 (two) times daily. 02/24/23   Gilmore Laroche, FNP  potassium chloride SA (KLOR-CON M) 20 MEQ tablet Take 1 tablet (20 mEq total) by mouth 2 (two) times daily. 01/16/23   Del Nigel Berthold, FNP  primidone (MYSOLINE) 50 MG tablet Take by mouth. 03/31/21   [provider]  Probiotic Product (GNP PROBIOTIC COLON SUPPORT) CAPS Take 1 capsule by mouth daily. 07/08/21   [provider]  Saccharomyces boulardii (PROBIOTIC) 250 MG CAPS Take 1 capsule by mouth daily. 12/09/22   [provider]  sertraline (ZOLOFT) 25 MG tablet TAKE ONE TABLET BY MOUTH ONCE DAILY 03/07/23   Gilmore Laroche, FNP  simvastatin (ZOCOR) 40 MG tablet TAKE ONE TABLET BY MOUTH AT BEDTIME 10/24/22   Gilmore Laroche, FNP   tadalafil, PAH, (ALYQ) 20 MG tablet Take 2 tablets every day by oral route.    [provider]  topiramate (TOPAMAX) 25 MG tablet Take 25 mg by mouth 2 (two) times daily. 01/06/23   [provider]  warfarin (COUMADIN) 3 MG tablet Take 3 mg by mouth daily. 06/23/21   [provider]      Allergies    Amoxicillin-pot clavulanate, Clarithromycin, Erythromycin, Lisinopril, and Amoxicillin    Review of Systems   Review of Systems  Musculoskeletal:  Positive for arthralgias.    Physical Exam Updated Vital Signs BP 129/80 (BP Location: Right Arm)   Pulse 62   Temp 98.7 F (37.1 C) (Oral)   Resp 16  Ht 5\' 8"  (1.727 m)   Wt 63.5 kg   SpO2 95%   BMI 21.29 kg/m  Physical Exam Vitals and nursing note reviewed.  Constitutional:      General: She is not in acute distress.    Appearance: She is well-developed.  HENT:     Head: Normocephalic and atraumatic.  Eyes:     Conjunctiva/sclera: Conjunctivae normal.  Cardiovascular:     Rate and Rhythm: Normal rate and regular rhythm.     Heart sounds: No murmur heard. Pulmonary:     Effort: Pulmonary effort is normal. No respiratory distress.     Breath sounds: Normal breath sounds.  Abdominal:     Palpations: Abdomen is soft.     Tenderness: There is no abdominal tenderness.  Musculoskeletal:        General: No swelling.     Cervical back: Neck supple.     Comments: Mild tenderness to left lower ribs   Skin:    General: Skin is warm and dry.     Capillary Refill: Capillary refill takes less than 2 seconds.     Comments: Mild ecchymosis over left lower ribs. no open wound  Neurological:     Mental Status: She is alert and oriented to person, place, and time. Mental status is at baseline.  Psychiatric:        Mood and Affect: Mood normal.     ED Results / Procedures / Treatments   Labs (all labs ordered are listed, but only abnormal results are displayed) Labs Reviewed - No data to  display  EKG None  Radiology DG Ribs Unilateral W/Chest Left  Result Date: 04/12/2023 CLINICAL DATA:  left rib pain.  Fall 3 days back. EXAM: LEFT RIBS AND CHEST - 3+ VIEW COMPARISON:  Chest radiograph from earlier the same day and 02/12/2023. FINDINGS: Redemonstration of nonspecific opacities overlying the right mid lower lung zones. Bilateral lungs are otherwise clear. No acute consolidation or lung collapse. No pneumothorax. Bilateral costophrenic angles are clear. Stable cardio-mediastinal silhouette. No acute displaced rib fracture.  No focal rib lesion. L1 kyphoplasty changes noted. The soft tissues are within normal limits. IMPRESSION: *No acute rib fracture.  No focal rib lesion. Electronically Signed   By: Jules Schick M.D.   On: 04/12/2023 17:11   DG Chest 2 View  Result Date: 04/12/2023 CLINICAL DATA:  left rib pain following mechanincal fall. EXAM: CHEST - 2 VIEW COMPARISON:  02/12/2023. FINDINGS: Right mid lower lung zones, which are similar to prior studies and may represent underlying fibrosis/scarring. Bilateral lung fields are otherwise clear. No acute consolidation or lung collapse. Bilateral costophrenic angles are clear. Normal cardio-mediastinal silhouette. There is a left sided 2-lead pacemaker. No acute osseous abnormalities. The soft tissues are within normal limits. IMPRESSION: No active cardiopulmonary disease. Electronically Signed   By: Jules Schick M.D.   On: 04/12/2023 17:08    Procedures Procedures    Medications Ordered in ED Medications  lidocaine (LIDODERM) 5 % 1 patch (1 patch Transdermal Patch Applied 04/12/23 1602)    ED Course/ Medical Decision Making/ A&P Clinical Course as of 04/12/23 1836  Wed Apr 12, 2023  1748 DG Ribs Unilateral W/Chest Left [JT]    Clinical Course User Index [JT] Halford Decamp, PA-C                                 Medical Decision Making  This patient presents  to the ED with chief complaint(s) of left side pain  following mechanical fall with pertinent past medical history of PE on Coumadin.  The complaint involves an extensive differential diagnosis and also carries with it a high risk of complications and morbidity.    The differential diagnosis includes  Fracture, contusion, rib subluxation, intracranial injury, syncope The initial plan is to  Will start with chest x-ray to evaluate Additional history obtained: Additional history obtained from family Records reviewed Care Everywhere/External Records  Initial Assessment:   72 year old following witnessed fall against a table.  Appears to be truly mechanical and not related to syncope, chest pain, shortness of breath or dizziness.  No reported head injury or LOC.  She does not exhibit any midline tenderness.  No focal deficits, moves all extremities spontaneously.  Low concern for intracranial abnormality.  She has mild ecchymosis and tenderness along the left-sided rib.  Concern for possible fracture versus contusion.  Independent ECG/labs interpretation:  None Independent visualization and interpretation of imaging: I independently visualized the following imaging with scope of interpretation limited to determining acute life threatening conditions related to emergency care: Chest x-ray/ ribs left, which revealed no obvious fracture  Treatment and Reassessment: Lidocaine patch applied, upon reassessment this is provided some relief.  Will provide a refill upon discharge  Consultations obtained:   None  Disposition:   Patient discharged home with close PCP follow-up. The patient has been appropriately medically screened and/or stabilized in the ED. I have low suspicion for any other emergent medical condition which would require further screening, evaluation or treatment in the ED or require inpatient management. At time of discharge the patient is hemodynamically stable and in no acute distress. I have discussed work-up results and diagnosis with  patient and answered all questions. Patient is agreeable with discharge plan. We discussed strict return precautions for returning to the emergency department and they verbalized understanding.     Social Determinants of Health:   None  This note was dictated with voice recognition software.  Despite best efforts at proofreading, errors may have occurred which can change the documentation meaning.           Final Clinical Impression(s) / ED Diagnoses Final diagnoses:  Contusion of rib on left side, initial encounter    Rx / DC Orders ED Discharge Orders          Ordered    lidocaine (HM LIDOCAINE PATCH) 4 %  Every 24 hours        04/12/23 1802              Fabienne Bruns 04/12/23 1836    Rondel Baton, MD 04/15/23 [phone]

## 2023-04-12 NOTE — Discharge Instructions (Addendum)
It was a pleasure taking care of you this evening.  You were evaluated in the emergency department following a fall onto her left side.  X-ray were taken of your ribs which did not show any obvious fracture.  Please take Tylenol as needed for pain.  You have additionally been prescribed lidocaine patches to use as needed for pain.  Experience any new or worsening symptoms including shortness of breath, cough return to the emergency department for further evaluation.  Please follow-up with your PCP within the next week for further evaluation.

## 2023-04-13 ENCOUNTER — Inpatient Hospital Stay: Payer: Medicare Other

## 2023-04-13 ENCOUNTER — Inpatient Hospital Stay: Payer: Medicare Other | Admitting: Hematology

## 2023-04-13 VITALS — BP 100/62 | HR 84 | Temp 97.7°F | Resp 18 | Wt 141.4 lb

## 2023-04-13 DIAGNOSIS — C9000 Multiple myeloma not having achieved remission: Secondary | ICD-10-CM

## 2023-04-13 LAB — CBC WITH DIFFERENTIAL/PLATELET
Abs Immature Granulocytes: 0.01 10*3/uL (ref 0.00–0.07)
Basophils Absolute: 0 10*3/uL (ref 0.0–0.1)
Basophils Relative: 0 %
Eosinophils Absolute: 0.1 10*3/uL (ref 0.0–0.5)
Eosinophils Relative: 2 %
HCT: 38.4 % (ref 36.0–46.0)
Hemoglobin: 12.4 g/dL (ref 12.0–15.0)
Immature Granulocytes: 0 %
Lymphocytes Relative: 26 %
Lymphs Abs: 1.2 10*3/uL (ref 0.7–4.0)
MCH: 34.7 pg — ABNORMAL HIGH (ref 26.0–34.0)
MCHC: 32.3 g/dL (ref 30.0–36.0)
MCV: 107.6 fL — ABNORMAL HIGH (ref 80.0–100.0)
Monocytes Absolute: 0.6 10*3/uL (ref 0.1–1.0)
Monocytes Relative: 12 %
Neutro Abs: 2.7 10*3/uL (ref 1.7–7.7)
Neutrophils Relative %: 60 %
Platelets: 116 10*3/uL — ABNORMAL LOW (ref 150–400)
RBC: 3.57 MIL/uL — ABNORMAL LOW (ref 3.87–5.11)
RDW: 16.8 % — ABNORMAL HIGH (ref 11.5–15.5)
WBC: 4.5 10*3/uL (ref 4.0–10.5)
nRBC: 0 % (ref 0.0–0.2)

## 2023-04-13 LAB — COMPREHENSIVE METABOLIC PANEL
ALT: 17 U/L (ref 0–44)
AST: 21 U/L (ref 15–41)
Albumin: 4 g/dL (ref 3.5–5.0)
Alkaline Phosphatase: 41 U/L (ref 38–126)
Anion gap: 12 (ref 5–15)
BUN: 17 mg/dL (ref 8–23)
CO2: 25 mmol/L (ref 22–32)
Calcium: 9.2 mg/dL (ref 8.9–10.3)
Chloride: 104 mmol/L (ref 98–111)
Creatinine, Ser: 1 mg/dL (ref 0.44–1.00)
GFR, Estimated: 59 mL/min — ABNORMAL LOW (ref >60)
Glucose, Bld: 98 mg/dL (ref 70–99)
Potassium: 3.5 mmol/L (ref 3.5–5.1)
Sodium: 141 mmol/L (ref 135–145)
Total Bilirubin: 0.5 mg/dL (ref <1.2)
Total Protein: 7.1 g/dL (ref 6.5–8.1)

## 2023-04-13 LAB — MAGNESIUM: Magnesium: 2.2 mg/dL (ref 1.7–2.4)

## 2023-04-13 MED ORDER — BORTEZOMIB CHEMO SQ INJECTION 3.5 MG (2.5MG/ML)
0.7000 mg/m2 | Freq: Once | INTRAMUSCULAR | Status: AC
  Administered 2023-04-13: 1.25 mg via SUBCUTANEOUS
  Filled 2023-04-13: qty 0.5

## 2023-04-13 MED ORDER — PROCHLORPERAZINE MALEATE 10 MG PO TABS
10.0000 mg | ORAL_TABLET | Freq: Once | ORAL | Status: AC
  Administered 2023-04-13: 10 mg via ORAL
  Filled 2023-04-13: qty 1

## 2023-04-13 NOTE — Patient Instructions (Signed)
 [facility] - A DEPT OF [facility]  Discharge Instructions: Thank you for choosing  Cancer Center to provide your oncology and hematology care.  If you have a lab appointment with the Cancer Center - please note that after April 8th, 2024, all labs will be drawn in the cancer center.  You do not have to check in or register with the main entrance as you have in the past but will complete your check-in in the cancer center.  Wear comfortable clothing and clothing appropriate for easy access to any Portacath or PICC line.   We strive to give you quality time with your provider. You may need to reschedule your appointment if you arrive late (15 or more minutes).  Arriving late affects you and other patients whose appointments are after yours.  Also, if you miss three or more appointments without notifying the office, you may be dismissed from the clinic at the provider's discretion.      For prescription refill requests, have your pharmacy contact our office and allow 72 hours for refills to be completed.    Today you received the following chemotherapy and/or immunotherapy agents Velcade   To help prevent nausea and vomiting after your treatment, we encourage you to take your nausea medication as directed.  Bortezomib Injection What is this medication? BORTEZOMIB (bor TEZ oh mib) treats lymphoma. It may also be used to treat multiple myeloma, a type of bone marrow cancer. It works by blocking a protein that causes cancer cells to grow and multiply. This helps to slow or stop the spread of cancer cells. This medicine may be used for other purposes; ask your health care provider or pharmacist if you have questions. COMMON BRAND NAME(S): Velcade What should I tell my care team before I take this medication? They need to know if you have any of these conditions: Dehydration Diabetes Heart disease Liver disease Tingling of the fingers or toes or other  nerve disorder An unusual or allergic reaction to bortezomib, other medications, foods, dyes, or preservatives If you or your partner are pregnant or trying to get pregnant Breastfeeding How should I use this medication? This medication is injected into a vein or under the skin. It is given by your care team in a hospital or clinic setting. Talk to your care team about the use of this medication in children. Special care may be needed. Overdosage: If you think you have taken too much of this medicine contact a poison control center or emergency room at once. NOTE: This medicine is only for you. Do not share this medicine with others. What if I miss a dose? Keep appointments for follow-up doses. It is important not to miss your dose. Call your care team if you are unable to keep an appointment. What may interact with this medication? Ketoconazole Rifampin This list may not describe all possible interactions. Give your health care provider a list of all the medicines, herbs, non-prescription drugs, or dietary supplements you use. Also tell them if you smoke, drink alcohol, or use illegal drugs. Some items may interact with your medicine. What should I watch for while using this medication? Your condition will be monitored carefully while you are receiving this medication. You may need blood work while taking this medication. This medication may affect your coordination, reaction time, or judgment. Do not drive or operate machinery until you know how this medication affects you. Sit up or stand slowly to reduce the  risk of dizzy or fainting spells. Drinking alcohol with this medication can increase the risk of these side effects. This medication may increase your risk of getting an infection. Call your care team for advice if you get a fever, chills, sore throat, or other symptoms of a cold or flu. Do not treat yourself. Try to avoid being around people who are sick. Check with your care team if you  have severe diarrhea, nausea, and vomiting, or if you sweat a lot. The loss of too much body fluid may make it dangerous for you to take this medication. Talk to your care team if you may be pregnant. Serious birth defects can occur if you take this medication during pregnancy and for 7 months after the last dose. You will need a negative pregnancy test before starting this medication. Contraception is recommended while taking this medication and for 7 months after the last dose. Your care team can help you find the option that works for you. If your partner can get pregnant, use a condom during sex while taking this medication and for 4 months after the last dose. Do not breastfeed while taking this medication and for 2 months after the last dose. This medication may cause infertility. Talk to your care team if you are concerned about your fertility. What side effects may I notice from receiving this medication? Side effects that you should report to your care team as soon as possible: Allergic reactions--skin rash, itching, hives, swelling of the face, lips, tongue, or throat Bleeding--bloody or black, tar-like stools, vomiting blood or brown material that looks like coffee grounds, red or dark brown urine, small red or purple spots on skin, unusual bruising or bleeding Bleeding in the brain--severe headache, stiff neck, confusion, dizziness, change in vision, numbness or weakness of the face, arm, or leg, trouble speaking, trouble walking, vomiting Bowel blockage--stomach cramping, unable to have a bowel movement or pass gas, loss of appetite, vomiting Heart failure--shortness of breath, swelling of the ankles, feet, or hands, sudden weight gain, unusual weakness or fatigue Infection--fever, chills, cough, sore throat, wounds that don't heal, pain or trouble when passing urine, general feeling of discomfort or being unwell Liver injury--right upper belly pain, loss of appetite, nausea, light-colored  stool, dark yellow or brown urine, yellowing skin or eyes, unusual weakness or fatigue Low blood pressure--dizziness, feeling faint or lightheaded, blurry vision Lung injury--shortness of breath or trouble breathing, cough, spitting up blood, chest pain, fever Pain, tingling, or numbness in the hands or feet Severe or prolonged diarrhea Stomach pain, bloody diarrhea, pale skin, unusual weakness or fatigue, decrease in the amount of urine, which may be signs of hemolytic uremic syndrome Sudden and severe headache, confusion, change in vision, seizures, which may be signs of posterior reversible encephalopathy syndrome (PRES) TTP--purple spots on the skin or inside the mouth, pale skin, yellowing skin or eyes, unusual weakness or fatigue, fever, fast or irregular heartbeat, confusion, change in vision, trouble speaking, trouble walking Tumor lysis syndrome (TLS)--nausea, vomiting, diarrhea, decrease in the amount of urine, dark urine, unusual weakness or fatigue, confusion, muscle pain or cramps, fast or irregular heartbeat, joint pain Side effects that usually do not require medical attention (report to your care team if they continue or are bothersome): Constipation Diarrhea Fatigue Loss of appetite Nausea This list may not describe all possible side effects. Call your doctor for medical advice about side effects. You may report side effects to FDA at 1-800-FDA-1088. Where should I keep my medication?  This medication is given in a hospital or clinic. It will not be stored at home. NOTE: This sheet is a summary. It may not cover all possible information. If you have questions about this medicine, talk to your doctor, pharmacist, or health care provider.  2024 Elsevier/Gold Standard (2021-10-12 00:00:00)   BELOW ARE SYMPTOMS THAT SHOULD BE REPORTED IMMEDIATELY: *FEVER GREATER THAN 100.4 F (38 C) OR HIGHER *CHILLS OR SWEATING *NAUSEA AND VOMITING THAT IS NOT CONTROLLED WITH YOUR NAUSEA  MEDICATION *UNUSUAL SHORTNESS OF BREATH *UNUSUAL BRUISING OR BLEEDING *URINARY PROBLEMS (pain or burning when urinating, or frequent urination) *BOWEL PROBLEMS (unusual diarrhea, constipation, pain near the anus) TENDERNESS IN MOUTH AND THROAT WITH OR WITHOUT PRESENCE OF ULCERS (sore throat, sores in mouth, or a toothache) UNUSUAL RASH, SWELLING OR PAIN  UNUSUAL VAGINAL DISCHARGE OR ITCHING   Items with * indicate a potential emergency and should be followed up as soon as possible or go to the Emergency Department if any problems should occur.  Please show the CHEMOTHERAPY ALERT CARD or IMMUNOTHERAPY ALERT CARD at check-in to the Emergency Department and triage nurse.  Should you have questions after your visit or need to cancel or reschedule your appointment, please contact [facility] - A DEPT OF [facility] [phone]  and follow the prompts.  Office hours are 8:00 a.m. to 4:30 p.m. Monday - Friday. Please note that voicemails left after 4:00 p.m. may not be returned until the following business day.  We are closed weekends and major holidays. You have access to a nurse at all times for urgent questions. Please call the main number to the clinic [phone] and follow the prompts.  For any non-urgent questions, you may also contact your provider using MyChart. We now offer e-Visits for anyone 16 and older to request care online for non-urgent symptoms. For details visit [email or web address].   Also download the MyChart app! Go to the app store, search "MyChart", open the app, select [place], and log in with your MyChart username and password.

## 2023-04-13 NOTE — Progress Notes (Signed)
 Patient presents today for Velcade infusion. Patient is in satisfactory condition with no new complaints voiced.  Vital signs are stable.  Labs reviewed and all labs are within treatment parameters.  We will proceed with treatment per MD orders.    Treatment given today per MD orders. Tolerated infusion without adverse affects. Vital signs stable. No complaints at this time. Discharged from clinic via wheelchair in stable condition. Alert and oriented x 3. F/U with [facility] as scheduled.

## 2023-04-14 ENCOUNTER — Telehealth: Payer: Self-pay | Admitting: Family Medicine

## 2023-04-14 NOTE — Telephone Encounter (Signed)
Pt already had flu shot at the pharmacy

## 2023-04-14 NOTE — Telephone Encounter (Signed)
noted 

## 2023-04-27 ENCOUNTER — Inpatient Hospital Stay: Payer: Medicare Other | Attending: Hematology

## 2023-04-27 ENCOUNTER — Inpatient Hospital Stay: Payer: Medicare Other

## 2023-04-27 DIAGNOSIS — Z5112 Encounter for antineoplastic immunotherapy: Secondary | ICD-10-CM | POA: Insufficient documentation

## 2023-04-27 DIAGNOSIS — C9000 Multiple myeloma not having achieved remission: Secondary | ICD-10-CM | POA: Insufficient documentation

## 2023-05-01 ENCOUNTER — Inpatient Hospital Stay: Payer: Medicare Other

## 2023-05-01 VITALS — BP 97/67 | HR 73 | Temp 97.9°F | Resp 17 | Wt 142.2 lb

## 2023-05-01 DIAGNOSIS — Z5112 Encounter for antineoplastic immunotherapy: Secondary | ICD-10-CM | POA: Diagnosis present

## 2023-05-01 DIAGNOSIS — C9000 Multiple myeloma not having achieved remission: Secondary | ICD-10-CM | POA: Diagnosis present

## 2023-05-01 LAB — COMPREHENSIVE METABOLIC PANEL
ALT: 19 U/L (ref 0–44)
AST: 23 U/L (ref 15–41)
Albumin: 3.7 g/dL (ref 3.5–5.0)
Alkaline Phosphatase: 61 U/L (ref 38–126)
Anion gap: 10 (ref 5–15)
BUN: 16 mg/dL (ref 8–23)
CO2: 25 mmol/L (ref 22–32)
Calcium: 8.7 mg/dL — ABNORMAL LOW (ref 8.9–10.3)
Chloride: 104 mmol/L (ref 98–111)
Creatinine, Ser: 0.85 mg/dL (ref 0.44–1.00)
GFR, Estimated: >60 mL/min (ref >60)
Glucose, Bld: 111 mg/dL — ABNORMAL HIGH (ref 70–99)
Potassium: 3.4 mmol/L — ABNORMAL LOW (ref 3.5–5.1)
Sodium: 139 mmol/L (ref 135–145)
Total Bilirubin: 0.5 mg/dL (ref <1.2)
Total Protein: 7.2 g/dL (ref 6.5–8.1)

## 2023-05-01 LAB — CBC WITH DIFFERENTIAL/PLATELET
Abs Immature Granulocytes: 0.01 10*3/uL (ref 0.00–0.07)
Basophils Absolute: 0 10*3/uL (ref 0.0–0.1)
Basophils Relative: 0 %
Eosinophils Absolute: 0.1 10*3/uL (ref 0.0–0.5)
Eosinophils Relative: 1 %
HCT: 34.7 % — ABNORMAL LOW (ref 36.0–46.0)
Hemoglobin: 11.7 g/dL — ABNORMAL LOW (ref 12.0–15.0)
Immature Granulocytes: 0 %
Lymphocytes Relative: 29 %
Lymphs Abs: 1.5 10*3/uL (ref 0.7–4.0)
MCH: 35.3 pg — ABNORMAL HIGH (ref 26.0–34.0)
MCHC: 33.7 g/dL (ref 30.0–36.0)
MCV: 104.8 fL — ABNORMAL HIGH (ref 80.0–100.0)
Monocytes Absolute: 0.6 10*3/uL (ref 0.1–1.0)
Monocytes Relative: 11 %
Neutro Abs: 3 10*3/uL (ref 1.7–7.7)
Neutrophils Relative %: 59 %
Platelets: 116 10*3/uL — ABNORMAL LOW (ref 150–400)
RBC: 3.31 MIL/uL — ABNORMAL LOW (ref 3.87–5.11)
RDW: 15.7 % — ABNORMAL HIGH (ref 11.5–15.5)
WBC: 5.2 10*3/uL (ref 4.0–10.5)
nRBC: 0 % (ref 0.0–0.2)

## 2023-05-01 LAB — MAGNESIUM: Magnesium: 2.1 mg/dL (ref 1.7–2.4)

## 2023-05-01 MED ORDER — BORTEZOMIB CHEMO SQ INJECTION 3.5 MG (2.5MG/ML)
0.7000 mg/m2 | Freq: Once | INTRAMUSCULAR | Status: AC
Start: 2023-05-01 — End: 2023-05-01
  Administered 2023-05-01: 1.25 mg via SUBCUTANEOUS
  Filled 2023-05-01: qty 0.5

## 2023-05-01 MED ORDER — DEXAMETHASONE 4 MG PO TABS
20.0000 mg | ORAL_TABLET | Freq: Once | ORAL | Status: AC
  Administered 2023-05-01: 20 mg via ORAL
  Filled 2023-05-01: qty 5

## 2023-05-01 MED ORDER — POTASSIUM CHLORIDE CRYS ER 20 MEQ PO TBCR
40.0000 meq | EXTENDED_RELEASE_TABLET | Freq: Once | ORAL | Status: AC
  Administered 2023-05-01: 40 meq via ORAL
  Filled 2023-05-01: qty 2

## 2023-05-01 MED ORDER — PROCHLORPERAZINE MALEATE 10 MG PO TABS
10.0000 mg | ORAL_TABLET | Freq: Four times a day (QID) | ORAL | Status: DC | PRN
  Administered 2023-05-01: 10 mg via ORAL
  Filled 2023-05-01: qty 1

## 2023-05-01 NOTE — Patient Instructions (Signed)
[facility] [place] - A DEPT OF [facility]  Discharge Instructions: Thank you for choosing [facility] to provide your oncology and hematology care.  If you have a lab appointment with the Cancer Center - please note that after April 8th, 2024, all labs will be drawn in the cancer center.  You do not have to check in or register with the main entrance as you have in the past but will complete your check-in in the cancer center.  Wear comfortable clothing and clothing appropriate for easy access to any Portacath or PICC line.   We strive to give you quality time with your provider. You may need to reschedule your appointment if you arrive late (15 or more minutes).  Arriving late affects you and other patients whose appointments are after yours.  Also, if you miss three or more appointments without notifying the office, you may be dismissed from the clinic at the provider's discretion.      For prescription refill requests, have your pharmacy contact our office and allow 72 hours for refills to be completed.    Today you received the following chemotherapy and/or immunotherapy agents velcade   To help prevent nausea and vomiting after your treatment, we encourage you to take your nausea medication as directed.  BELOW ARE SYMPTOMS THAT SHOULD BE REPORTED IMMEDIATELY: *FEVER GREATER THAN 100.4 F (38 C) OR HIGHER *CHILLS OR SWEATING *NAUSEA AND VOMITING THAT IS NOT CONTROLLED WITH YOUR NAUSEA MEDICATION *UNUSUAL SHORTNESS OF BREATH *UNUSUAL BRUISING OR BLEEDING *URINARY PROBLEMS (pain or burning when urinating, or frequent urination) *BOWEL PROBLEMS (unusual diarrhea, constipation, pain near the anus) TENDERNESS IN MOUTH AND THROAT WITH OR WITHOUT PRESENCE OF ULCERS (sore throat, sores in mouth, or a toothache) UNUSUAL RASH, SWELLING OR PAIN  UNUSUAL VAGINAL DISCHARGE OR ITCHING   Items with * indicate a potential emergency and should be followed up as  soon as possible or go to the Emergency Department if any problems should occur.  Please show the CHEMOTHERAPY ALERT CARD or IMMUNOTHERAPY ALERT CARD at check-in to the Emergency Department and triage nurse.  Should you have questions after your visit or need to cancel or reschedule your appointment, please contact [facility]  - A DEPT OF [facility] [phone]  and follow the prompts.  Office hours are 8:00 a.m. to 4:30 p.m. Monday - Friday. Please note that voicemails left after 4:00 p.m. may not be returned until the following business day.  We are closed weekends and major holidays. You have access to a nurse at all times for urgent questions. Please call the main number to the clinic [phone] and follow the prompts.  For any non-urgent questions, you may also contact your provider using MyChart. We now offer e-Visits for anyone 63 and older to request care online for non-urgent symptoms. For details visit [email or web address].   Also download the MyChart app! Go to the app store, search "MyChart", open the app, select [place], and log in with your MyChart username and password.

## 2023-05-01 NOTE — Progress Notes (Signed)
Patient tolerated Velcade injection with no complaints voiced.  Site clean and dry with no bruising or swelling noted at site.  See MAR for details.  Band aid applied.  Patient stable during and after injection.  Vss with discharge and left in satisfactory condition with no s/s of distress noted. All follow ups as scheduled. Pt escorted via wheelchair to be driven home by daughter.   Courtney Rogers [employer]

## 2023-05-02 ENCOUNTER — Ambulatory Visit: Payer: Medicare Other | Admitting: Family Medicine

## 2023-05-03 ENCOUNTER — Other Ambulatory Visit: Payer: Self-pay | Admitting: Family Medicine

## 2023-05-03 DIAGNOSIS — J301 Allergic rhinitis due to pollen: Secondary | ICD-10-CM

## 2023-05-03 DIAGNOSIS — I10 Essential (primary) hypertension: Secondary | ICD-10-CM

## 2023-05-03 DIAGNOSIS — R1084 Generalized abdominal pain: Secondary | ICD-10-CM

## 2023-05-10 NOTE — Progress Notes (Signed)
[facility] [address] [place], [place] [address]    Clinic Day:  05/11/2023  Referring physician: Gilmore Laroche, FNP  Patient Care Team: Gilmore Laroche, FNP as PCP - General (Family Medicine) Doreatha Massed, MD as Medical Oncologist (Oncology) Mickie Bail, RN as Oncology Nurse Navigator (Oncology) Lanelle Bal, DO as Consulting Physician (Internal Medicine) Randa Spike, Kelton Pillar, LCSW as [facility] Care Management (Licensed Clinical Social Worker)   ASSESSMENT & PLAN:   Assessment: 1.  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  Increased number of atypical plasma cells present 36% of all cell lines.  Plasma cells are kappa light chain restricted. -Unfortunately her specimen has reached cytogenetics and FISH lab week later due to bad weather and no viable plasma cells. -PET scan on 07/09/2019 showed no findings of active myeloma. -24-hour urine shows total protein 59 mg.  Urine immunofixation shows IgA kappa monoclonal protein.  LDH is 184. -Based on [facility] criteria she has high risk with about 45-50% probability of progression to myeloma in the next 2 years. -CTAP on 01/07/2020 shows acute superior endplate burst fracture of L1.  Mild associated paravertebral soft tissue thickening.  No other bone abnormalities. -Bone density test on 02/07/2020 shows T score -0.5. - Bone marrow biopsy on 07/12/2021: Hypercellular with increased number of atypical plasma cells representing 30% of cells, displaying kappa light chain restriction. - FISH panel: Gain of 1 q. (high risk), T p53 deletion (high risk), monosomy 13 (standard risk) - Cytogenetics: Complex karyotype. - PET scan (06/24/2021): New hypermetabolic bone lesions involving left anterior iliac crest, left mid humerus, right superior pubic ramus, bilateral mid femurs, left calvarium.  Largest lytic lesion  in the left iliac crest measuring 2.9 x 2.3 cm. - 2D echo on 07/29/2021: EF 50-55%.  There is distal septal and inferior apical hypokinesis.  (Carfilzomib based regimen was put on hold due to echo findings) - Dara VRD 6 cycles from 08/10/2021 through 11/30/2021 - Myeloma panel 12/07/2021, DIRA test was negative, M spike 0.2 g and light chain ratio normal. - Maintenance Velcade every 2 weeks and Revlimid 3 weeks on 1 week off Started on 12/20/2021    2.  Social/family history: - She uses walker to ambulate since her right knee replacement leading to stiffness.  She lives with older sister and husband who has dementia. - She is independent of ADLs and IADLs.  She also drives     Plan: 1.  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  We are continuing to hold Revlimid. - She was seen in the ER on 04/12/2023 secondary to fall.  She reports left lower rib pain from the fall. - She reports her mood is better on Lexapro.  She is not taking BuSpar. - Reviewed labs today: Normal LFTs, creatinine and calcium.  CBC was normal. - Plan to continue Velcade every 2 weeks.  Continue to hold Revlimid. - RTC 6 weeks for follow-up with repeat multiple myeloma labs 2 weeks prior.   2.  Peripheral neuropathy: - She does not report any numbness or tingling.  Reports that it feels like having dirt under the fingers.   3.  Pulmonary embolism (2015): - Continue Coumadin indefinitely.  No bleeding issues.  4.  ID prophylaxis: - Continue acyclovir twice daily.  5.  Myeloma bone disease: - Will hold her denosumab as she is having dental work done.  6.  Right knee pain: - Continue hydrocodone 5/325 daily as needed.    Orders Placed This Encounter  Procedures   CBC with Differential    Standing Status:   Future    Expected Date:   07/20/2023    Expiration Date:   07/19/2024   Comprehensive metabolic panel    Standing  Status:   Future    Expected Date:   07/20/2023    Expiration Date:   07/19/2024   CBC with Differential    Standing Status:   Future    Expected Date:   08/17/2023    Expiration Date:   08/16/2024   Comprehensive metabolic panel    Standing Status:   Future    Expected Date:   08/17/2023    Expiration Date:   08/16/2024   CBC with Differential    Standing Status:   Future    Expected Date:   09/14/2023    Expiration Date:   09/13/2024   Comprehensive metabolic panel    Standing Status:   Future    Expected Date:   09/14/2023    Expiration Date:   09/13/2024     Mikeal Hawthorne R Teague,acting as a scribe for Doreatha Massed, MD.,have documented all relevant documentation on the behalf of Doreatha Massed, MD,as directed by  Doreatha Massed, MD while in the presence of Doreatha Massed, MD.  I, Doreatha Massed MD, have reviewed the above documentation for accuracy and completeness, and I agree with the above.       Doreatha Massed, MD   12/19/20244:40 PM  CHIEF COMPLAINT:   Diagnosis: multiple myeloma    Cancer Staging  No matching staging information was found for the patient.    Prior Therapy: Dara VRD 6 cycles completed on 11/30/2021   Current Therapy:  Maintenance Velcade every 2 weeks, and Revlimid 3 weeks on/1 week off    HISTORY OF PRESENT ILLNESS:   Oncology History  IgA myeloma (HCC)  05/06/2021 Initial Diagnosis   IgA myeloma (HCC)   08/10/2021 - 01/19/2022 Chemotherapy   Patient is on Treatment Plan : MYELOMA NEWLY DIAGNOSED TRANSPLANT CANDIDATE DaraVRd (Daratumumab SQ) q21d x 6 Cycles (Induction/Consolidation)     08/10/2021 -  Chemotherapy   Patient is on Treatment Plan : MYELOMA NEWLY DIAGNOSED TRANSPLANT CANDIDATE Velcade D1 and D 15 q 28 days        INTERVAL HISTORY:   Courtney Rogers is a 72 y.o. female presenting to clinic today for follow up of multiple myeloma. She was last seen by me on 03/30/23.  Since her last visit, she was seen in  the ED on 04/12/23 for a contusion of a rib on the left side after a fall. She was given lidocaine patches for pain.   Today, she states that she is doing well overall. Her appetite level is at 60%. Her energy level is at 50%. She c/o GERD symptoms in the stomach. She also has occasional nausea. She has not yet picked up or taken Protonix prescribed to her on 12.11.24 to treat GERD. She also notes rib pain from her fall today. She reports a normal appetite, She denies any peripheral neuropathy. She denies the feeling of sandpaper under the fingernails, though she feels "crud" under the nails. She is taking Lexapro, but not Buspar. She is taking Coumadin as prescribed. She has an appointment at the dentist soon for a possible procedure.   PAST  MEDICAL HISTORY:   Past Medical History: Past Medical History:  Diagnosis Date   Acute myocardial infarction [facility]) 2009   CAD/no stent medically managed   Anxiety disorder    CAD (coronary artery disease)    Cancer (HCC)    multiple myeloma   Hyperlipidemia    Hypertension    IBS (irritable bowel syndrome)    Non-ulcer dyspepsia 04/30/2009   Qualifier: Diagnosis of  By: Darrick Penna MD, Sandi L    Overactive bladder    PE (pulmonary embolism) 10/2012   left lung   Presence of permanent cardiac pacemaker    Sleep apnea     Surgical History: Past Surgical History:  Procedure Laterality Date   ABDOMINAL HYSTERECTOMY     BSO secondary to cyst     CARDIAC CATHETERIZATION     COLONOSCOPY  12/2009   Dr. Aleene Davidson, propofol, normal. Next TCS 12/2019   COLONOSCOPY N/A 05/22/2017   Examined portion ileum was normal, significant looping of colon, external hemorrhoids and rectal bleeding due to internal hemorrhoids, mild diverticulosis procedure: COLONOSCOPY;  Surgeon: West Bali, MD;  [facility];  Service: Endoscopy;  Laterality: N/A;  10:30am   ESOPHAGOGASTRODUODENOSCOPY  05/11/2009   schatzki ring/small hiatal hernia/path:gastritis    ESOPHAGOGASTRODUODENOSCOPY N/A 05/22/2017   Multiple gastric polyps with biopsy benign fundic gland polyp, small bowel biopsy negative for celiac, gastric biopsy with mild gastritis but no H. pylori procedure: ESOPHAGOGASTRODUODENOSCOPY (EGD);  Surgeon: West Bali, MD;  Location: [facility];  Service: Endoscopy;  Laterality: N/A;   ESOPHAGOGASTRODUODENOSCOPY (EGD) WITH ESOPHAGEAL DILATION N/A 04/01/2013   [id number] at the gastroesophageal juction/multiple small polyps/mild gastritis   GIVENS CAPSULE STUDY N/A 06/07/2017   Frequent gastric erosions, normal small bowel mucosa.  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    Social History: Social History   Socioeconomic History   Marital status: Married    Spouse name: Doctor, general practice   Number of children: 3   Years of education: 16   Highest education level: Master's degree (e.g., MA, MS, MEng, MEd, MSW, MBA)  Occupational History   Not on file  Tobacco Use   Smoking status: Never    Passive exposure: Never   Smokeless tobacco: Never   Tobacco comments:    Never smoked   Vaping Use   Vaping status: Never Used  Substance and Sexual Activity   Alcohol use: Not Currently    Comment: occ wine   Drug use: No   Sexual activity: Not Currently  Other Topics Concern   Not on file  Social History Narrative      Lives with husband-51 years 950 in Aug 2021   Daughter is close by, 2 sons live further away   One IN [place],ONE IN [place], ONE BESIDE HER.        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   Enjoys: reading, young adult      Diet: eats all food groups    Caffeine: coffee 1, tea daily soda-1 daily   Water: 1-2 cups      Wears seat belt    Does not use phone while driving   Smoke detectors at home     [employer] -locked up  Left handed   One story home   Drinks caffeine   Social Drivers of Health   Financial Resource Strain: Low Risk  (03/17/2023)   Overall Financial Resource Strain (CARDIA)    Difficulty of Paying Living Expenses: Not hard at all  Food Insecurity: No Food Insecurity (03/17/2023)   Hunger Vital Sign    Worried About Running Out of Food in the Last Year: Never true    Ran Out of Food in the Last Year: Never true  Transportation Needs: No Transportation Needs (03/17/2023)   PRAPARE - Administrator, Civil Service (Medical): No    Lack of Transportation (Non-Medical): No  Physical Activity: Insufficiently Active (03/17/2023)   Exercise Vital Sign    Days of Exercise per Week: 1 day    Minutes of Exercise per Session: 10 min  Stress: Stress Concern Present (03/17/2023)   [employer] of Occupational Health - Occupational Stress Questionnaire    Feeling of Stress : Very much  Social Connections: Socially Integrated (03/17/2023)   Social Connection and Isolation Panel [NHANES]    Frequency of Communication with Friends and Family: More than three times a week    Frequency of Social Gatherings with Friends and Family: Never    Attends Religious Services: 1 to 4 times per year    Active Member of [employer] or Organizations: Yes    Attends Banker Meetings: 1 to 4 times per year    Marital Status: Married  Catering manager Violence: Not At Risk (12/21/2022)   Humiliation, Afraid, Rape, and Kick questionnaire    Fear of Current or Ex-Partner: No    Emotionally Abused: No    Physically Abused: No    Sexually Abused: No    Family History: Family History  Problem Relation Age of Onset   Colon polyps Neg Hx    Colon cancer Neg Hx     Current Medications:  Current Outpatient Medications:    acetaminophen (TYLENOL) 500 MG tablet, Take 1,000 mg by mouth 2 (two) times daily as needed for moderate pain or headache.,  Disp: , Rfl:    acyclovir (ZOVIRAX) 400 MG tablet, TAKE ONE TABLET BY MOUTH TWICE DAILY, Disp: 60 tablet, Rfl: 6   amLODipine (NORVASC) 10 MG tablet, TAKE ONE TABLET BY MOUTH ONCE DAILY, Disp: 90 tablet, Rfl: 4   ascorbic acid (VITAMIN C) 500 MG tablet, Take 500 mg by mouth daily., Disp: , Rfl:    aspirin EC 81 MG tablet, Take 81 mg by mouth at bedtime., Disp: , Rfl:    B Complex Vitamins (VITAMIN B COMPLEX) TABS, Take 1 tablet by mouth daily., Disp: , Rfl:    B Complex-Folic Acid (B COMPLEX VITAMINS, W/ FA,) CAPS, Take 1 capsule by mouth daily., Disp: , Rfl:    bortezomib SQ (VELCADE) 3.5 MG injection, Inject into the skin once., Disp: , Rfl:    busPIRone (BUSPAR) 5 MG tablet, Take 5 mg by mouth daily., Disp: , Rfl:    Calcium Carbonate-Vitamin D (CALCIUM 600+D PO), Take 1 tablet by mouth 2 (two) times daily., Disp: , Rfl:    chlorthalidone (HYGROTON) 25 MG tablet, Take 25 mg by mouth daily., Disp: , Rfl:    cholecalciferol (VITAMIN D3) 10 MCG (400 UNIT) TABS tablet, Take 400 Units by mouth daily., Disp: , Rfl:    cyanocobalamin (VITAMIN B12) 1000 MCG tablet, Take by mouth., Disp: , Rfl:    diclofenac Sodium (VOLTAREN) 1 % GEL, Apply 4 g topically 4 (four) times  daily., Disp: 50 g, Rfl: 0   fluticasone (FLONASE) 50 MCG/ACT nasal spray, USE 2 SPRAYS IN EACH NOSTRIL ONCE DAILY, Disp: 16 g, Rfl: 0   fluticasone-salmeterol (ADVAIR) 250-50 MCG/ACT AEPB, Inhale into the lungs., Disp: , Rfl:    folic acid (FOLVITE) 1 MG tablet, TAKE ONE TABLET BY MOUTH EVERY DAY, Disp: 30 tablet, Rfl: 11   gabapentin (NEURONTIN) 100 MG capsule, Take 100 mg by mouth 3 (three) times daily., Disp: , Rfl:    HYDROcodone-acetaminophen (NORCO/VICODIN) 5-325 MG tablet, TAKE ONE TABLET BY MOUTH EVERY 8 HOURS AS NEEDED FOR MODERATE PAIN, Disp: 60 tablet, Rfl: 0   hydrOXYzine (VISTARIL) 25 MG capsule, Take 1 capsule (25 mg total) by mouth at bedtime as needed., Disp: 30 capsule, Rfl: 0   Hyoscyamine Sulfate SL 0.125 MG SUBL, ,  Disp: , Rfl:    lenalidomide (REVLIMID) 10 MG capsule, Take 1 capsule (10 mg total) by mouth daily. 21 days on, 7 days off every 28 days, Disp: 21 capsule, Rfl: 0   levocetirizine (XYZAL) 5 MG tablet, TAKE ONE TABLET BY MOUTH IN THE EVENING, Disp: 30 tablet, Rfl: 3   lidocaine (HM LIDOCAINE PATCH) 4 %, Place 1 patch onto the skin daily., Disp: 14 patch, Rfl: 0   losartan (COZAAR) 100 MG tablet, TAKE ONE TABLET BY MOUTH ONCE DAILY, Disp: 30 tablet, Rfl: 3   magnesium oxide (MAG-OX) 400 (240 Mg) MG tablet, TAKE ONE TABLET BY MOUTH TWICE DAILY, Disp: 60 tablet, Rfl: 4   metoprolol tartrate (LOPRESSOR) 50 MG tablet, Take 1 tablet (50 mg total) by mouth 2 (two) times daily., Disp: 180 tablet, Rfl: 1   montelukast (SINGULAIR) 10 MG tablet, TAKE ONE TABLET BY MOUTH AT BEDTIME, Disp: 30 tablet, Rfl: 3   MYRBETRIQ 50 MG TB24 tablet, TAKE ONE TABLET BY MOUTH ONCE DAILY, Disp: 30 tablet, Rfl: 3   nitroGLYCERIN (NITROSTAT) 0.4 MG SL tablet, Place 0.4 mg under the tongue every 5 (five) minutes as needed for chest pain., Disp: , Rfl:    pantoprazole (PROTONIX) 40 MG tablet, TAKE ONE TABLET BY MOUTH TWICE DAILY, Disp: 90 tablet, Rfl: 3   potassium chloride SA (KLOR-CON M) 20 MEQ tablet, Take 1 tablet (20 mEq total) by mouth 2 (two) times daily., Disp: , Rfl:    primidone (MYSOLINE) 50 MG tablet, Take by mouth., Disp: , Rfl:    Probiotic Product (GNP PROBIOTIC COLON SUPPORT) CAPS, Take 1 capsule by mouth daily., Disp: , Rfl:    Saccharomyces boulardii (PROBIOTIC) 250 MG CAPS, Take 1 capsule by mouth daily., Disp: , Rfl:    sertraline (ZOLOFT) 25 MG tablet, TAKE ONE TABLET BY MOUTH ONCE DAILY, Disp: 30 tablet, Rfl: 3   simvastatin (ZOCOR) 40 MG tablet, TAKE ONE TABLET BY MOUTH AT BEDTIME, Disp: 90 tablet, Rfl: 1   tadalafil, PAH, (ALYQ) 20 MG tablet, Take 2 tablets every day by oral route., Disp: , Rfl:    topiramate (TOPAMAX) 25 MG tablet, Take 25 mg by mouth 2 (two) times daily., Disp: , Rfl:    warfarin  (COUMADIN) 3 MG tablet, Take 3 mg by mouth daily., Disp: , Rfl:    Allergies: Allergies  Allergen Reactions   Amoxicillin-Pot Clavulanate Other (See Comments)   Clarithromycin Other (See Comments)    Stomach problems   Erythromycin    Lisinopril Swelling   Amoxicillin Diarrhea    REVIEW OF SYSTEMS:   Review of Systems  Constitutional:  Negative for chills, fatigue and fever.  HENT:   Negative  for lump/mass, mouth sores, nosebleeds, sore throat and trouble swallowing.   Eyes:  Negative for eye problems.  Respiratory:  Positive for shortness of breath. Negative for cough.   Cardiovascular:  Negative for chest pain, leg swelling and palpitations.  Gastrointestinal:  Negative for abdominal pain, constipation, diarrhea, nausea and vomiting.  Genitourinary:  Negative for bladder incontinence, difficulty urinating, dysuria, frequency, hematuria and nocturia.   Musculoskeletal:  Negative for arthralgias, back pain, flank pain, myalgias and neck pain.       +rib pain from recent fall +pain throughout the body, 7/10 severity  Skin:  Negative for itching and rash.  Neurological:  Positive for headaches. Negative for dizziness and numbness.  Hematological:  Does not bruise/bleed easily.  Psychiatric/Behavioral:  Positive for depression. Negative for sleep disturbance and suicidal ideas. The patient is nervous/anxious.   All other systems reviewed and are negative.    VITALS:   Blood pressure 118/66, pulse 65, temperature 98 F (36.7 C), temperature source Tympanic, resp. rate 17, height 5\' 3"  (1.6 m), weight 142 lb 6.4 oz (64.6 kg), SpO2 100%.  Wt Readings from Last 3 Encounters:  05/11/23 142 lb 6.4 oz (64.6 kg)  05/01/23 142 lb 3.2 oz (64.5 kg)  04/13/23 141 lb 6.4 oz (64.1 kg)    Body mass index is 25.23 kg/m.  Performance status (ECOG): 1 - Symptomatic but completely ambulatory  PHYSICAL EXAM:   Physical Exam Vitals and nursing note reviewed. Exam conducted with a chaperone  present.  Constitutional:      Appearance: Normal appearance.  Cardiovascular:     Rate and Rhythm: Normal rate and regular rhythm.     Pulses: Normal pulses.     Heart sounds: Normal heart sounds.  Pulmonary:     Effort: Pulmonary effort is normal.     Breath sounds: Normal breath sounds.  Abdominal:     Palpations: Abdomen is soft. There is no hepatomegaly, splenomegaly or mass.     Tenderness: There is no abdominal tenderness.  Musculoskeletal:     Right lower leg: No edema.     Left lower leg: No edema.  Lymphadenopathy:     Cervical: No cervical adenopathy.     Right cervical: No superficial, deep or posterior cervical adenopathy.    Left cervical: No superficial, deep or posterior cervical adenopathy.     Upper Body:     Right upper body: No supraclavicular or axillary adenopathy.     Left upper body: No supraclavicular or axillary adenopathy.  Neurological:     General: No focal deficit present.     Mental Status: She is alert and oriented to person, place, and time.  Psychiatric:        Mood and Affect: Mood normal.        Behavior: Behavior normal.     LABS:      Latest Ref Rng & Units 05/11/2023    9:12 AM 05/01/2023    9:03 AM 04/13/2023   10:12 AM  CBC  WBC 4.0 - 10.5 K/uL 8.1  5.2  4.5   Hemoglobin 12.0 - 15.0 g/dL 95.2  84.1  32.4   Hematocrit 36.0 - 46.0 % 37.0  34.7  38.4   Platelets 150 - 400 K/uL 157  116  116       Latest Ref Rng & Units 05/11/2023    9:12 AM 05/01/2023    9:03 AM 04/13/2023   10:12 AM  CMP  Glucose 70 - 99 mg/dL 98  401  98   BUN 8 - 23 mg/dL 12  16  17    Creatinine 0.44 - 1.00 mg/dL 4.09  8.11  9.14   Sodium 135 - 145 mmol/L 138  139  141   Potassium 3.5 - 5.1 mmol/L 3.6  3.4  3.5   Chloride 98 - 111 mmol/L 103  104  104   CO2 22 - 32 mmol/L 25  25  25    Calcium 8.9 - 10.3 mg/dL 9.1  8.7  9.2   Total Protein 6.5 - 8.1 g/dL 7.2  7.2  7.1   Total Bilirubin <1.2 mg/dL 0.4  0.5  0.5   Alkaline Phos 38 - 126 U/L 54  61  41    AST 15 - 41 U/L 23  23  21    ALT 0 - 44 U/L 16  19  17       No results found for: "CEA1", "CEA" / No results found for: "CEA1", "CEA" No results found for: "PSA1" No results found for: "CAN199" No results found for: "CAN125"  Lab Results  Component Value Date   TOTALPROTELP 6.3 03/23/2023   ALBUMINELP 3.8 03/23/2023   A1GS 0.3 03/23/2023   A2GS 0.6 03/23/2023   BETS 1.0 03/23/2023   GAMS 0.6 03/23/2023   MSPIKE 0.2 (H) 03/23/2023   SPEI Comment 03/23/2023   Lab Results  Component Value Date   TIBC 291 06/09/2021   TIBC 297 01/21/2021   TIBC 302 10/21/2020   FERRITIN 279 06/09/2021   FERRITIN 216 01/21/2021   FERRITIN 274 10/21/2020   IRONPCTSAT 24 06/09/2021   IRONPCTSAT 19 01/21/2021   IRONPCTSAT 28 10/21/2020   Lab Results  Component Value Date   LDH 129 03/02/2022   LDH 121 12/14/2021   LDH 113 11/09/2021     STUDIES:   DG Ribs Unilateral W/Chest Left Result Date: 04/12/2023 CLINICAL DATA:  left rib pain.  Fall 3 days back. EXAM: LEFT RIBS AND CHEST - 3+ VIEW COMPARISON:  Chest radiograph from earlier the same day and 02/12/2023. FINDINGS: Redemonstration of nonspecific opacities overlying the right mid lower lung zones. Bilateral lungs are otherwise clear. No acute consolidation or lung collapse. No pneumothorax. Bilateral costophrenic angles are clear. Stable cardio-mediastinal silhouette. No acute displaced rib fracture.  No focal rib lesion. L1 kyphoplasty changes noted. The soft tissues are within normal limits. IMPRESSION: *No acute rib fracture.  No focal rib lesion. Electronically Signed   By: Jules Schick M.D.   On: 04/12/2023 17:11   DG Chest 2 View Result Date: 04/12/2023 CLINICAL DATA:  left rib pain following mechanincal fall. EXAM: CHEST - 2 VIEW COMPARISON:  02/12/2023. FINDINGS: Right mid lower lung zones, which are similar to prior studies and may represent underlying fibrosis/scarring. Bilateral lung fields are otherwise clear. No acute  consolidation or lung collapse. Bilateral costophrenic angles are clear. Normal cardio-mediastinal silhouette. There is a left sided 2-lead pacemaker. No acute osseous abnormalities. The soft tissues are within normal limits. IMPRESSION: No active cardiopulmonary disease. Electronically Signed   By: Jules Schick M.D.   On: 04/12/2023 17:08

## 2023-05-11 ENCOUNTER — Inpatient Hospital Stay: Payer: Medicare Other

## 2023-05-11 ENCOUNTER — Inpatient Hospital Stay: Payer: Medicare Other | Admitting: Hematology

## 2023-05-11 VITALS — BP 118/66 | HR 65 | Temp 98.0°F | Resp 17 | Ht 63.0 in | Wt 142.4 lb

## 2023-05-11 DIAGNOSIS — C9000 Multiple myeloma not having achieved remission: Secondary | ICD-10-CM | POA: Diagnosis not present

## 2023-05-11 LAB — COMPREHENSIVE METABOLIC PANEL
ALT: 16 U/L (ref 0–44)
AST: 23 U/L (ref 15–41)
Albumin: 4 g/dL (ref 3.5–5.0)
Alkaline Phosphatase: 54 U/L (ref 38–126)
Anion gap: 10 (ref 5–15)
BUN: 12 mg/dL (ref 8–23)
CO2: 25 mmol/L (ref 22–32)
Calcium: 9.1 mg/dL (ref 8.9–10.3)
Chloride: 103 mmol/L (ref 98–111)
Creatinine, Ser: 0.93 mg/dL (ref 0.44–1.00)
GFR, Estimated: >60 mL/min (ref >60)
Glucose, Bld: 98 mg/dL (ref 70–99)
Potassium: 3.6 mmol/L (ref 3.5–5.1)
Sodium: 138 mmol/L (ref 135–145)
Total Bilirubin: 0.4 mg/dL (ref <1.2)
Total Protein: 7.2 g/dL (ref 6.5–8.1)

## 2023-05-11 LAB — CBC WITH DIFFERENTIAL/PLATELET
Abs Immature Granulocytes: 0.02 10*3/uL (ref 0.00–0.07)
Basophils Absolute: 0 10*3/uL (ref 0.0–0.1)
Basophils Relative: 0 %
Eosinophils Absolute: 0 10*3/uL (ref 0.0–0.5)
Eosinophils Relative: 0 %
HCT: 37 % (ref 36.0–46.0)
Hemoglobin: 12.1 g/dL (ref 12.0–15.0)
Immature Granulocytes: 0 %
Lymphocytes Relative: 14 %
Lymphs Abs: 1.1 10*3/uL (ref 0.7–4.0)
MCH: 34.9 pg — ABNORMAL HIGH (ref 26.0–34.0)
MCHC: 32.7 g/dL (ref 30.0–36.0)
MCV: 106.6 fL — ABNORMAL HIGH (ref 80.0–100.0)
Monocytes Absolute: 0.8 10*3/uL (ref 0.1–1.0)
Monocytes Relative: 10 %
Neutro Abs: 6.2 10*3/uL (ref 1.7–7.7)
Neutrophils Relative %: 76 %
Platelets: 157 10*3/uL (ref 150–400)
RBC: 3.47 MIL/uL — ABNORMAL LOW (ref 3.87–5.11)
RDW: 15.2 % (ref 11.5–15.5)
WBC: 8.1 10*3/uL (ref 4.0–10.5)
nRBC: 0 % (ref 0.0–0.2)

## 2023-05-11 LAB — MAGNESIUM: Magnesium: 2.3 mg/dL (ref 1.7–2.4)

## 2023-05-11 MED ORDER — BORTEZOMIB CHEMO SQ INJECTION 3.5 MG (2.5MG/ML)
0.7000 mg/m2 | Freq: Once | INTRAMUSCULAR | Status: AC
Start: 2023-05-11 — End: 2023-05-11
  Administered 2023-05-11: 1.25 mg via SUBCUTANEOUS
  Filled 2023-05-11: qty 0.5

## 2023-05-11 MED ORDER — PROCHLORPERAZINE MALEATE 10 MG PO TABS
10.0000 mg | ORAL_TABLET | Freq: Once | ORAL | Status: AC
Start: 2023-05-11 — End: 2023-05-11
  Administered 2023-05-11: 10 mg via ORAL
  Filled 2023-05-11: qty 1

## 2023-05-11 NOTE — Progress Notes (Signed)
 Patient tolerated Velcade injection with no complaints voiced.  Site clean and dry with no bruising or swelling noted at site.  See MAR for details.  Band aid applied.  Patient stable during and after injection.  Vss with discharge and left in satisfactory condition with no s/s of distress noted. All follow ups as scheduled.  Courtney Rogers [employer]

## 2023-05-11 NOTE — Progress Notes (Signed)
Daun Peacock today per MD  T.O. Dr Carilyn Goodpasture, PharmD

## 2023-05-11 NOTE — Patient Instructions (Signed)

## 2023-05-11 NOTE — Patient Instructions (Signed)
 [facility] [place] - A DE[facility] OF [facility]  Discharge Instructions: Thank you for choosing [facility] to provide your oncology and hematology care.  If you have a lab appointment with the Cancer Center - please note that after April 8th, 2024, all labs will be drawn in the cancer center.  You do not have to check in or register with the main entrance as you have in the past but will complete your check-in in the cancer center.  Wear comfortable clothing and clothing appropriate for easy access to any Portacath or PICC line.   We strive to give you quality time with your provider. You may need to reschedule your appointment if you arrive late (15 or more minutes).  Arriving late affects you and other patients whose appointments are after yours.  Also, if you miss three or more appointments without notifying the office, you may be dismissed from the clinic at the provider's discretion.      For prescription refill requests, have your pharmacy contact our office and allow 72 hours for refills to be completed.    Today you received the following chemotherapy and/or immunotherapy agents velcade   To help prevent nausea and vomiting after your treatment, we encourage you to take your nausea medication as directed.  BELOW ARE SYMPTOMS THAT SHOULD BE REPORTED IMMEDIATELY: *FEVER GREATER THAN 100.4 F (38 C) OR HIGHER *CHILLS OR SWEATING *NAUSEA AND VOMITING THAT IS NOT CONTROLLED WITH YOUR NAUSEA MEDICATION *UNUSUAL SHORTNESS OF BREATH *UNUSUAL BRUISING OR BLEEDING *URINARY PROBLEMS (pain or burning when urinating, or frequent urination) *BOWEL PROBLEMS (unusual diarrhea, constipation, pain near the anus) TENDERNESS IN MOUTH AND THROAT WITH OR WITHOUT PRESENCE OF ULCERS (sore throat, sores in mouth, or a toothache) UNUSUAL RASH, SWELLING OR PAIN  UNUSUAL VAGINAL DISCHARGE OR ITCHING   Items with * indicate a potential emergency and should be followed up as  soon as possible or go to the Emergency Department if any problems should occur.  Please show the CHEMOTHERAPY ALERT CARD or IMMUNOTHERAPY ALERT CARD at check-in to the Emergency Department and triage nurse.  Should you have questions after your visit or need to cancel or reschedule your appointment, please contact [facility]  - A DEPT OF [facility] [phone]  and follow the prompts.  Office hours are 8:00 a.m. to 4:30 p.m. Monday - Friday. Please note that voicemails left after 4:00 p.m. may not be returned until the following business day.  We are closed weekends and major holidays. You have access to a nurse at all times for urgent questions. Please call the main number to the clinic [phone] and follow the prompts.  For any non-urgent questions, you may also contact your provider using MyChart. We now offer e-Visits for anyone 63 and older to request care online for non-urgent symptoms. For details visit [email or web address].   Also download the MyChart app! Go to the app store, search "MyChart", open the app, select [place], and log in with your MyChart username and password.

## 2023-05-22 ENCOUNTER — Encounter (HOSPITAL_COMMUNITY): Payer: Self-pay | Admitting: Hematology

## 2023-05-22 ENCOUNTER — Encounter: Payer: Self-pay | Admitting: Hematology

## 2023-05-25 ENCOUNTER — Inpatient Hospital Stay: Payer: Medicare Other | Attending: Hematology

## 2023-05-25 ENCOUNTER — Inpatient Hospital Stay: Payer: Medicare Other

## 2023-05-25 VITALS — BP 100/58 | HR 66 | Temp 96.2°F | Resp 18 | Wt 140.2 lb

## 2023-05-25 DIAGNOSIS — Z7982 Long term (current) use of aspirin: Secondary | ICD-10-CM | POA: Diagnosis not present

## 2023-05-25 DIAGNOSIS — C9000 Multiple myeloma not having achieved remission: Secondary | ICD-10-CM

## 2023-05-25 DIAGNOSIS — Z7961 Long term (current) use of immunomodulator: Secondary | ICD-10-CM | POA: Diagnosis not present

## 2023-05-25 DIAGNOSIS — Z79899 Other long term (current) drug therapy: Secondary | ICD-10-CM | POA: Insufficient documentation

## 2023-05-25 DIAGNOSIS — Z5112 Encounter for antineoplastic immunotherapy: Secondary | ICD-10-CM | POA: Insufficient documentation

## 2023-05-25 DIAGNOSIS — Z79624 Long term (current) use of inhibitors of nucleotide synthesis: Secondary | ICD-10-CM | POA: Insufficient documentation

## 2023-05-25 DIAGNOSIS — Z86711 Personal history of pulmonary embolism: Secondary | ICD-10-CM | POA: Diagnosis not present

## 2023-05-25 DIAGNOSIS — E876 Hypokalemia: Secondary | ICD-10-CM

## 2023-05-25 LAB — COMPREHENSIVE METABOLIC PANEL
ALT: 14 U/L (ref 0–44)
AST: 18 U/L (ref 15–41)
Albumin: 3.8 g/dL (ref 3.5–5.0)
Alkaline Phosphatase: 45 U/L (ref 38–126)
Anion gap: 8 (ref 5–15)
BUN: 16 mg/dL (ref 8–23)
CO2: 24 mmol/L (ref 22–32)
Calcium: 9.1 mg/dL (ref 8.9–10.3)
Chloride: 103 mmol/L (ref 98–111)
Creatinine, Ser: 0.87 mg/dL (ref 0.44–1.00)
GFR, Estimated: >60 mL/min (ref >60)
Glucose, Bld: 94 mg/dL (ref 70–99)
Potassium: 3.3 mmol/L — ABNORMAL LOW (ref 3.5–5.1)
Sodium: 135 mmol/L (ref 135–145)
Total Bilirubin: 0.6 mg/dL (ref 0.0–1.2)
Total Protein: 7.1 g/dL (ref 6.5–8.1)

## 2023-05-25 LAB — CBC WITH DIFFERENTIAL/PLATELET
Abs Immature Granulocytes: 0.01 10*3/uL (ref 0.00–0.07)
Basophils Absolute: 0 10*3/uL (ref 0.0–0.1)
Basophils Relative: 1 %
Eosinophils Absolute: 0.1 10*3/uL (ref 0.0–0.5)
Eosinophils Relative: 2 %
HCT: 34.6 % — ABNORMAL LOW (ref 36.0–46.0)
Hemoglobin: 11.9 g/dL — ABNORMAL LOW (ref 12.0–15.0)
Immature Granulocytes: 0 %
Lymphocytes Relative: 29 %
Lymphs Abs: 1.2 10*3/uL (ref 0.7–4.0)
MCH: 36.2 pg — ABNORMAL HIGH (ref 26.0–34.0)
MCHC: 34.4 g/dL (ref 30.0–36.0)
MCV: 105.2 fL — ABNORMAL HIGH (ref 80.0–100.0)
Monocytes Absolute: 0.6 10*3/uL (ref 0.1–1.0)
Monocytes Relative: 15 %
Neutro Abs: 2.1 10*3/uL (ref 1.7–7.7)
Neutrophils Relative %: 53 %
Platelets: 139 10*3/uL — ABNORMAL LOW (ref 150–400)
RBC: 3.29 MIL/uL — ABNORMAL LOW (ref 3.87–5.11)
RDW: 14.9 % (ref 11.5–15.5)
WBC: 4 10*3/uL (ref 4.0–10.5)
nRBC: 0 % (ref 0.0–0.2)

## 2023-05-25 LAB — MAGNESIUM: Magnesium: 2.2 mg/dL (ref 1.7–2.4)

## 2023-05-25 MED ORDER — PROCHLORPERAZINE MALEATE 10 MG PO TABS
10.0000 mg | ORAL_TABLET | Freq: Four times a day (QID) | ORAL | Status: DC | PRN
  Administered 2023-05-25: 10 mg via ORAL
  Filled 2023-05-25: qty 1

## 2023-05-25 MED ORDER — BORTEZOMIB CHEMO SQ INJECTION 3.5 MG (2.5MG/ML)
0.7000 mg/m2 | Freq: Once | INTRAMUSCULAR | Status: AC
  Administered 2023-05-25: 1.25 mg via SUBCUTANEOUS
  Filled 2023-05-25: qty 0.5

## 2023-05-25 MED ORDER — POTASSIUM CHLORIDE CRYS ER 20 MEQ PO TBCR
40.0000 meq | EXTENDED_RELEASE_TABLET | Freq: Once | ORAL | Status: AC
Start: 2023-05-25 — End: 2023-05-25
  Administered 2023-05-25: 40 meq via ORAL
  Filled 2023-05-25: qty 2

## 2023-05-25 MED ORDER — DEXAMETHASONE 4 MG PO TABS
20.0000 mg | ORAL_TABLET | Freq: Once | ORAL | Status: AC
  Administered 2023-05-25: 20 mg via ORAL
  Filled 2023-05-25: qty 5

## 2023-05-25 NOTE — Progress Notes (Signed)
 Patient presents today for Velcade  injection.  Patient is in satisfactory condition with no new complaints voiced.  Vital signs are stable.  Labs reviewed and all labs are within treatment parameters. Potassium today is 3.3.  We will give Klor Con 40 mEq PO x one dose today per standing orders by Dr. Rogers.  We will proceed with treatment per MD orders.    Patient tolerated injection with no complaints voiced.  Site clean and dry with no bruising or swelling noted.  No complaints of pain.  Patient slightly hypotensive at discharge.  Patient stated that it has been low at times.  Patient advised to contact PCP to discuss hypotension and the two medications she is currently taking.  Patient verbalized understanding.  Discharged with vital signs stable and no signs or symptoms of distress noted.

## 2023-05-25 NOTE — Patient Instructions (Signed)
 [facility] [place] - [facility]  Discharge Instructions: Thank you for choosing [facility] to provide your oncology and hematology care.  If you have a lab appointment with the Cancer Center - please note that after April 8th, 2024, all labs will be drawn in the cancer center.  You do not have to check in or register with the main entrance as you have in the past but will complete your check-in in the cancer center.  Wear comfortable clothing and clothing appropriate for easy access to any Portacath or PICC line.   We strive to give you quality time with your provider. You may need to reschedule your appointment if you arrive late (15 or more minutes).  Arriving late affects you and other patients whose appointments are after yours.  Also, if you miss three or more appointments without notifying the office, you may be dismissed from the clinic at the provider's discretion.      For prescription refill requests, have your pharmacy contact our office and allow 72 hours for refills to be completed.    Today you received the following chemotherapy and/or immunotherapy agents Velcade. Bortezomib Injection What is this medication? BORTEZOMIB (bor TEZ oh mib) treats lymphoma. It may also be used to treat multiple myeloma, a type of bone marrow cancer. It works by blocking a protein that causes cancer cells to grow and multiply. This helps to slow or stop the spread of cancer cells. This medicine may be used for other purposes; ask your health care provider or pharmacist if you have questions. COMMON BRAND NAME(S): Velcade What should I tell my care team before I take this medication? They need to know if you have any of these conditions: Dehydration Diabetes Heart disease Liver disease Tingling of the fingers or toes or other nerve disorder An unusual or allergic reaction to bortezomib, other medications, foods, dyes, or preservatives If you or  your partner are pregnant or trying to get pregnant Breastfeeding How should I use this medication? This medication is injected into a vein or under the skin. It is given by your care team in a hospital or clinic setting. Talk to your care team about the use of this medication in children. Special care may be needed. Overdosage: If you think you have taken too much of this medicine contact a poison control center or emergency room at once. NOTE: This medicine is only for you. Do not share this medicine with others. What if I miss a dose? Keep appointments for follow-up doses. It is important not to miss your dose. Call your care team if you are unable to keep an appointment. What may interact with this medication? Ketoconazole Rifampin This list may not describe all possible interactions. Give your health care provider a list of all the medicines, herbs, non-prescription drugs, or dietary supplements you use. Also tell them if you smoke, drink alcohol, or use illegal drugs. Some items may interact with your medicine. What should I watch for while using this medication? Your condition will be monitored carefully while you are receiving this medication. You may need blood work while taking this medication. This medication may affect your coordination, reaction time, or judgment. Do not drive or operate machinery until you know how this medication affects you. Sit up or stand slowly to reduce the risk of dizzy or fainting spells. Drinking alcohol with this medication can increase the risk of these side effects. This medication  may increase your risk of getting an infection. Call your care team for advice if you get a fever, chills, sore throat, or other symptoms of a cold or flu. Do not treat yourself. Try to avoid being around people who are sick. Check with your care team if you have severe diarrhea, nausea, and vomiting, or if you sweat a lot. The loss of too much body fluid may make it dangerous  for you to take this medication. Talk to your care team if you may be pregnant. Serious birth defects can occur if you take this medication during pregnancy and for 7 months after the last dose. You will need a negative pregnancy test before starting this medication. Contraception is recommended while taking this medication and for 7 months after the last dose. Your care team can help you find the option that works for you. If your partner can get pregnant, use a condom during sex while taking this medication and for 4 months after the last dose. Do not breastfeed while taking this medication and for 2 months after the last dose. This medication may cause infertility. Talk to your care team if you are concerned about your fertility. What side effects may I notice from receiving this medication? Side effects that you should report to your care team as soon as possible: Allergic reactions--skin rash, itching, hives, swelling of the face, lips, tongue, or throat Bleeding--bloody or black, tar-like stools, vomiting blood or  material that looks like coffee grounds, red or dark  urine, small red or purple spots on skin, unusual bruising or bleeding Bleeding in the brain--severe headache, stiff neck, confusion, dizziness, change in vision, numbness or weakness of the face, arm, or leg, trouble speaking, trouble walking, vomiting Bowel blockage--stomach cramping, unable to have a bowel movement or pass gas, loss of appetite, vomiting Heart failure--shortness of breath, swelling of the ankles, feet, or hands, sudden weight gain, unusual weakness or fatigue Infection--fever, chills, cough, sore throat, wounds that don't heal, pain or trouble when passing urine, general feeling of discomfort or being unwell Liver injury--right upper belly pain, loss of appetite, nausea, light-colored stool, dark yellow or  urine, yellowing skin or eyes, unusual weakness or fatigue Low blood pressure--dizziness,  feeling faint or lightheaded, blurry vision Lung injury--shortness of breath or trouble breathing, cough, spitting up blood, chest pain, fever Pain, tingling, or numbness in the hands or feet Severe or prolonged diarrhea Stomach pain, bloody diarrhea, pale skin, unusual weakness or fatigue, decrease in the amount of urine, which may be signs of hemolytic uremic syndrome Sudden and severe headache, confusion, change in vision, seizures, which may be signs of posterior reversible encephalopathy syndrome (PRES) TTP--purple spots on the skin or inside the mouth, pale skin, yellowing skin or eyes, unusual weakness or fatigue, fever, fast or irregular heartbeat, confusion, change in vision, trouble speaking, trouble walking Tumor lysis syndrome (TLS)--nausea, vomiting, diarrhea, decrease in the amount of urine, dark urine, unusual weakness or fatigue, confusion, muscle pain or cramps, fast or irregular heartbeat, joint pain Side effects that usually do not require medical attention (report to your care team if they continue or are bothersome): Constipation Diarrhea Fatigue Loss of appetite Nausea This list may not describe all possible side effects. Call your doctor for medical advice about side effects. You may report side effects to FDA at 1-800-FDA-1088. Where should I keep my medication? This medication is given in a hospital or clinic. It will not be stored at home. NOTE: This sheet is a  summary. It may not cover all possible information. If you have questions about this medicine, talk to your doctor, pharmacist, or health care provider.  2024 Elsevier/Gold Standard (2021-10-12 00:00:00)       To help prevent nausea and vomiting after your treatment, we encourage you to take your nausea medication as directed.  BELOW ARE SYMPTOMS THAT SHOULD BE REPORTED IMMEDIATELY: *FEVER GREATER THAN 100.4 F (38 C) OR HIGHER *CHILLS OR SWEATING *NAUSEA AND VOMITING THAT IS NOT CONTROLLED WITH YOUR NAUSEA  MEDICATION *UNUSUAL SHORTNESS OF BREATH *UNUSUAL BRUISING OR BLEEDING *URINARY PROBLEMS (pain or burning when urinating, or frequent urination) *BOWEL PROBLEMS (unusual diarrhea, constipation, pain near the anus) TENDERNESS IN MOUTH AND THROAT WITH OR WITHOUT PRESENCE OF ULCERS (sore throat, sores in mouth, or a toothache) UNUSUAL RASH, SWELLING OR PAIN  UNUSUAL VAGINAL DISCHARGE OR ITCHING   Items with * indicate a potential emergency and should be followed up as soon as possible or go to the Emergency Department if any problems should occur.  Please show the CHEMOTHERAPY ALERT CARD or IMMUNOTHERAPY ALERT CARD at check-in to the Emergency Department and triage nurse.  Should you have questions after your visit or need to cancel or reschedule your appointment, please contact [facility] [place] - A DEPT OF [facility] [phone]  and follow the prompts.  Office hours are 8:00 a.m. to 4:30 p.m. Monday - Friday. Please note that voicemails left after 4:00 p.m. may not be returned until the following business day.  We are closed weekends and major holidays. You have access to a nurse at all times for urgent questions. Please call the main number to the clinic [phone] and follow the prompts.  For any non-urgent questions, you may also contact your provider using MyChart. We now offer e-Visits for anyone 33 and older to request care online for non-urgent symptoms. For details visit [email or web address].   Also download the MyChart app! Go to the app store, search "MyChart", open the app, select [place], and log in with your MyChart username and password.

## 2023-06-01 ENCOUNTER — Other Ambulatory Visit: Payer: Self-pay | Admitting: Family Medicine

## 2023-06-01 DIAGNOSIS — R32 Unspecified urinary incontinence: Secondary | ICD-10-CM

## 2023-06-01 DIAGNOSIS — J301 Allergic rhinitis due to pollen: Secondary | ICD-10-CM

## 2023-06-07 ENCOUNTER — Other Ambulatory Visit: Payer: Self-pay

## 2023-06-07 DIAGNOSIS — C9000 Multiple myeloma not having achieved remission: Secondary | ICD-10-CM

## 2023-06-08 ENCOUNTER — Inpatient Hospital Stay: Payer: Medicare Other

## 2023-06-08 ENCOUNTER — Encounter (HOSPITAL_COMMUNITY): Payer: Self-pay | Admitting: Hematology

## 2023-06-08 ENCOUNTER — Encounter: Payer: Self-pay | Admitting: Hematology

## 2023-06-08 VITALS — BP 100/64 | HR 64 | Temp 97.8°F | Resp 18 | Wt 142.2 lb

## 2023-06-08 DIAGNOSIS — C9000 Multiple myeloma not having achieved remission: Secondary | ICD-10-CM | POA: Diagnosis not present

## 2023-06-08 LAB — CBC WITH DIFFERENTIAL/PLATELET
Abs Immature Granulocytes: 0.01 10*3/uL (ref 0.00–0.07)
Basophils Absolute: 0 10*3/uL (ref 0.0–0.1)
Basophils Relative: 1 %
Eosinophils Absolute: 0.1 10*3/uL (ref 0.0–0.5)
Eosinophils Relative: 2 %
HCT: 36.5 % (ref 36.0–46.0)
Hemoglobin: 12.2 g/dL (ref 12.0–15.0)
Immature Granulocytes: 0 %
Lymphocytes Relative: 25 %
Lymphs Abs: 1 10*3/uL (ref 0.7–4.0)
MCH: 35.9 pg — ABNORMAL HIGH (ref 26.0–34.0)
MCHC: 33.4 g/dL (ref 30.0–36.0)
MCV: 107.4 fL — ABNORMAL HIGH (ref 80.0–100.0)
Monocytes Absolute: 0.4 10*3/uL (ref 0.1–1.0)
Monocytes Relative: 11 %
Neutro Abs: 2.4 10*3/uL (ref 1.7–7.7)
Neutrophils Relative %: 61 %
Platelets: 123 10*3/uL — ABNORMAL LOW (ref 150–400)
RBC: 3.4 MIL/uL — ABNORMAL LOW (ref 3.87–5.11)
RDW: 14.4 % (ref 11.5–15.5)
WBC: 3.9 10*3/uL — ABNORMAL LOW (ref 4.0–10.5)
nRBC: 0 % (ref 0.0–0.2)

## 2023-06-08 LAB — COMPREHENSIVE METABOLIC PANEL
ALT: 17 U/L (ref 0–44)
AST: 20 U/L (ref 15–41)
Albumin: 3.9 g/dL (ref 3.5–5.0)
Alkaline Phosphatase: 43 U/L (ref 38–126)
Anion gap: 8 (ref 5–15)
BUN: 18 mg/dL (ref 8–23)
CO2: 27 mmol/L (ref 22–32)
Calcium: 9 mg/dL (ref 8.9–10.3)
Chloride: 104 mmol/L (ref 98–111)
Creatinine, Ser: 0.97 mg/dL (ref 0.44–1.00)
GFR, Estimated: >60 mL/min (ref >60)
Glucose, Bld: 69 mg/dL — ABNORMAL LOW (ref 70–99)
Potassium: 3.1 mmol/L — ABNORMAL LOW (ref 3.5–5.1)
Sodium: 139 mmol/L (ref 135–145)
Total Bilirubin: 0.4 mg/dL (ref 0.0–1.2)
Total Protein: 6.9 g/dL (ref 6.5–8.1)

## 2023-06-08 LAB — MAGNESIUM: Magnesium: 2.1 mg/dL (ref 1.7–2.4)

## 2023-06-08 MED ORDER — BORTEZOMIB CHEMO SQ INJECTION 3.5 MG (2.5MG/ML)
0.7000 mg/m2 | Freq: Once | INTRAMUSCULAR | Status: AC
Start: 2023-06-08 — End: 2023-06-08
  Administered 2023-06-08: 1.25 mg via SUBCUTANEOUS
  Filled 2023-06-08: qty 0.5

## 2023-06-08 MED ORDER — PROCHLORPERAZINE MALEATE 10 MG PO TABS
10.0000 mg | ORAL_TABLET | Freq: Once | ORAL | Status: AC
Start: 2023-06-08 — End: 2023-06-08
  Administered 2023-06-08: 10 mg via ORAL
  Filled 2023-06-08: qty 1

## 2023-06-08 MED ORDER — POTASSIUM CHLORIDE CRYS ER 20 MEQ PO TBCR
40.0000 meq | EXTENDED_RELEASE_TABLET | Freq: Once | ORAL | Status: AC
  Administered 2023-06-08: 40 meq via ORAL
  Filled 2023-06-08: qty 2

## 2023-06-08 NOTE — Progress Notes (Signed)
Pt.'s blood sugar 69 at 1137. Snacks and drink given to patient. Blood sugar recheck and was 83. Pt asymptomatic and states she feels, more snack and drinks supplied to patient.   Patient tolerated Velcade injection with no complaints voiced.  Site clean and dry with no bruising or swelling noted at site.  See MAR for details.  Band aid applied.  Patient stable during and after injection.  Vss with discharge and left in satisfactory condition with no s/s of distress noted. All follow ups as scheduled.   Courtney Rogers

## 2023-06-08 NOTE — Patient Instructions (Signed)
 [facility] [place] - A DE[facility] OF [facility]  Discharge Instructions: Thank you for choosing [facility] to provide your oncology and hematology care.  If you have a lab appointment with the Cancer Center - please note that after April 8th, 2024, all labs will be drawn in the cancer center.  You do not have to check in or register with the main entrance as you have in the past but will complete your check-in in the cancer center.  Wear comfortable clothing and clothing appropriate for easy access to any Portacath or PICC line.   We strive to give you quality time with your provider. You may need to reschedule your appointment if you arrive late (15 or more minutes).  Arriving late affects you and other patients whose appointments are after yours.  Also, if you miss three or more appointments without notifying the office, you may be dismissed from the clinic at the provider's discretion.      For prescription refill requests, have your pharmacy contact our office and allow 72 hours for refills to be completed.    Today you received the following chemotherapy and/or immunotherapy agents velcade   To help prevent nausea and vomiting after your treatment, we encourage you to take your nausea medication as directed.  BELOW ARE SYMPTOMS THAT SHOULD BE REPORTED IMMEDIATELY: *FEVER GREATER THAN 100.4 F (38 C) OR HIGHER *CHILLS OR SWEATING *NAUSEA AND VOMITING THAT IS NOT CONTROLLED WITH YOUR NAUSEA MEDICATION *UNUSUAL SHORTNESS OF BREATH *UNUSUAL BRUISING OR BLEEDING *URINARY PROBLEMS (pain or burning when urinating, or frequent urination) *BOWEL PROBLEMS (unusual diarrhea, constipation, pain near the anus) TENDERNESS IN MOUTH AND THROAT WITH OR WITHOUT PRESENCE OF ULCERS (sore throat, sores in mouth, or a toothache) UNUSUAL RASH, SWELLING OR PAIN  UNUSUAL VAGINAL DISCHARGE OR ITCHING   Items with * indicate a potential emergency and should be followed up as  soon as possible or go to the Emergency Department if any problems should occur.  Please show the CHEMOTHERAPY ALERT CARD or IMMUNOTHERAPY ALERT CARD at check-in to the Emergency Department and triage nurse.  Should you have questions after your visit or need to cancel or reschedule your appointment, please contact [facility]  - A DEPT OF [facility] [phone]  and follow the prompts.  Office hours are 8:00 a.m. to 4:30 p.m. Monday - Friday. Please note that voicemails left after 4:00 p.m. may not be returned until the following business day.  We are closed weekends and major holidays. You have access to a nurse at all times for urgent questions. Please call the main number to the clinic [phone] and follow the prompts.  For any non-urgent questions, you may also contact your provider using MyChart. We now offer e-Visits for anyone 63 and older to request care online for non-urgent symptoms. For details visit [email or web address].   Also download the MyChart app! Go to the app store, search "MyChart", open the app, select [place], and log in with your MyChart username and password.

## 2023-06-09 LAB — KAPPA/LAMBDA LIGHT CHAINS
Kappa free light chain: 12.7 mg/L (ref 3.3–19.4)
Kappa, lambda light chain ratio: 0.7 (ref 0.26–1.65)
Lambda free light chains: 18.1 mg/L (ref 5.7–26.3)

## 2023-06-09 LAB — GLUCOSE, CAPILLARY: Glucose-Capillary: 83 mg/dL (ref 70–99)

## 2023-06-18 LAB — PROTEIN ELECTROPHORESIS, SERUM
A/G Ratio: 1.5 (ref 0.7–1.7)
Albumin ELP: 3.8 g/dL (ref 2.9–4.4)
Alpha-1-Globulin: 0.2 g/dL (ref 0.0–0.4)
Alpha-2-Globulin: 0.6 g/dL (ref 0.4–1.0)
Beta Globulin: 1 g/dL (ref 0.7–1.3)
Gamma Globulin: 0.8 g/dL (ref 0.4–1.8)
Globulin, Total: 2.6 g/dL (ref 2.2–3.9)
M-Spike, %: 0.3 g/dL — ABNORMAL HIGH
Total Protein ELP: 6.4 g/dL (ref 6.0–8.5)

## 2023-06-20 ENCOUNTER — Other Ambulatory Visit: Payer: Self-pay | Admitting: Family Medicine

## 2023-06-20 DIAGNOSIS — J301 Allergic rhinitis due to pollen: Secondary | ICD-10-CM

## 2023-06-21 ENCOUNTER — Ambulatory Visit: Payer: BC Managed Care – PPO | Admitting: Family Medicine

## 2023-06-21 NOTE — Progress Notes (Signed)
[facility] [address], [place] [address]    Clinic Day:  06/22/2023  Referring physician: Gilmore Laroche, FNP  Patient Care Team: Gilmore Laroche, FNP as PCP - General (Family Medicine) Doreatha Massed, MD as Medical Oncologist (Oncology) Mickie Bail, RN as Oncology Nurse Navigator (Oncology) Lanelle Bal, DO as Consulting Physician (Internal Medicine) Randa Spike, Kelton Pillar, LCSW as [facility] Care Management (Licensed Clinical Social Worker)   ASSESSMENT & PLAN:   Assessment: 1.  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  Increased number of atypical plasma cells present 36% of all cell lines.  Plasma cells are kappa light chain restricted. -Unfortunately her specimen has reached cytogenetics and FISH lab week later due to bad weather and no viable plasma cells. -PET scan on 07/09/2019 showed no findings of active myeloma. -24-hour urine shows total protein 59 mg.  Urine immunofixation shows IgA kappa monoclonal protein.  LDH is 184. -Based on [facility] criteria she has high risk with about 45-50% probability of progression to myeloma in the next 2 years. -CTAP on 01/07/2020 shows acute superior endplate burst fracture of L1.  Mild associated paravertebral soft tissue thickening.  No other bone abnormalities. -Bone density test on 02/07/2020 shows T score -0.5. - Bone marrow biopsy on 07/12/2021: Hypercellular with increased number of atypical plasma cells representing 30% of cells, displaying kappa light chain restriction. - FISH panel: Gain of 1 q. (high risk), T p53 deletion (high risk), monosomy 13 (standard risk) - Cytogenetics: Complex karyotype. - PET scan (06/24/2021): New hypermetabolic bone lesions involving left anterior iliac crest, left mid humerus, right superior pubic ramus, bilateral mid femurs, left calvarium.  Largest lytic lesion  in the left iliac crest measuring 2.9 x 2.3 cm. - 2D echo on 07/29/2021: EF 50-55%.  There is distal septal and inferior apical hypokinesis.  (Carfilzomib based regimen was put on hold due to echo findings) - Dara VRD 6 cycles from 08/10/2021 through 11/30/2021 - Myeloma panel 12/07/2021, DIRA test was negative, M spike 0.2 g and light chain ratio normal. - Maintenance Velcade every 2 weeks and Revlimid 3 weeks on 1 week off Started on 12/20/2021    2.  Social/family history: - She uses walker to ambulate since her right knee replacement leading to stiffness.  She lives with older sister and husband who has dementia. - She is independent of ADLs and IADLs.  She also drives     Plan: 1.  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  Will held Revlimid few months ago due to severe fatigue and mental status changes and depression. - Mentally she is feeling better today.  She is also eating well. - Reviewed myeloma labs from 06/08/2023: M spike is stable at 0.3 g.  FLC ratio is normal at 0.7.  White count 2.7 with ANC 1.2.  Platelet count is 84.  LFTs are normal.  She may proceed with Velcade every 2 weeks.  Will see her back in 8 weeks for follow-up.  If the M spike goes up, will consider adding Revlimid back again.   2.  Peripheral neuropathy: - Denies any numbness or tingling.  She feels dry underneath fingernails.  Will closely monitor.   3.  Pulmonary embolism (2015): - She is continuing Coumadin indefinitely.  No bleeding issues reported from last visit.  4.  ID prophylaxis: - She will continue acyclovir twice daily.  5.  Myeloma bone disease: - Diabetes is on hold due to active dental work.  6.  Right knee pain: - Continue hydrocodone 5/325 daily as needed.    No orders of the defined types were placed in this encounter.    Courtney Rogers,acting as a Neurosurgeon for Doreatha Massed, MD.,have documented all relevant  documentation on the behalf of Doreatha Massed, MD,as directed by  Doreatha Massed, MD while in the presence of Doreatha Massed, MD.  I, Doreatha Massed MD, have reviewed the above documentation for accuracy and completeness, and I agree with the above.]     Doreatha Massed, MD   1/30/20251:09 PM  CHIEF COMPLAINT:   Diagnosis: multiple myeloma    Cancer Staging  No matching staging information was found for the patient.    Prior Therapy: Dara VRD 6 cycles completed on 11/30/2021   Current Therapy:  Maintenance Velcade every 2 weeks, and Revlimid 3 weeks on/1 week off    HISTORY OF PRESENT ILLNESS:   Oncology History  IgA myeloma (HCC)  05/06/2021 Initial Diagnosis   IgA myeloma (HCC)   08/10/2021 - 01/19/2022 Chemotherapy   Patient is on Treatment Plan : MYELOMA NEWLY DIAGNOSED TRANSPLANT CANDIDATE DaraVRd (Daratumumab SQ) q21d x 6 Cycles (Induction/Consolidation)     08/10/2021 -  Chemotherapy   Patient is on Treatment Plan : MYELOMA NEWLY DIAGNOSED TRANSPLANT CANDIDATE Velcade D1 and D 15 q 28 days        INTERVAL HISTORY:   Courtney Rogers is a 73 y.o. female presenting to clinic today for follow up of multiple myeloma. She was last seen by me on 05/11/23.  Today, she states that she is doing well overall. Her appetite level is at 75%. Her energy level is at 75%.   She reports a healthy appetite. She states under her fingernails feel extremely dry, similar to as if she has dry cotton underneath the nails. She is tolerating Velcade injections well. She states her mood is typically happy most days. She denies any recent falls, diarrhea, or peripheral neuropathy. He is taking Coumadin as prescribed. She currently resides with her sister and husband who has dementia.  PAST MEDICAL HISTORY:   Past Medical History: Past Medical History:  Diagnosis Date   Acute myocardial infarction [facility]) 2009   CAD/no stent medically managed   Anxiety disorder    CAD  (coronary artery disease)    Cancer (HCC)    multiple myeloma   Hyperlipidemia    Hypertension    IBS (irritable bowel syndrome)    Non-ulcer dyspepsia 04/30/2009   Qualifier: Diagnosis of  By: Darrick Penna MD, Sandi L    Overactive bladder    PE (pulmonary embolism) 10/2012   left lung   Presence of permanent cardiac pacemaker    Sleep apnea     Surgical History: Past Surgical History:  Procedure Laterality Date   ABDOMINAL HYSTERECTOMY     BSO secondary to cyst     CARDIAC CATHETERIZATION     COLONOSCOPY  12/2009   Dr. Aleene Davidson, propofol, normal. Next TCS 12/2019   COLONOSCOPY N/A 05/22/2017   Examined portion ileum was normal, significant looping of colon, external hemorrhoids and rectal bleeding due to internal hemorrhoids, mild diverticulosis procedure: COLONOSCOPY;  Surgeon: West Bali, MD;  [facility]: [facility];  Service: Endoscopy;  Laterality: N/A;  10:30am   ESOPHAGOGASTRODUODENOSCOPY  05/11/2009   schatzki ring/small hiatal hernia/path:gastritis   ESOPHAGOGASTRODUODENOSCOPY N/A 05/22/2017  Multiple gastric polyps with biopsy benign fundic gland polyp, small bowel biopsy negative for celiac, gastric biopsy with mild gastritis but no H. pylori procedure: ESOPHAGOGASTRODUODENOSCOPY (EGD);  Surgeon: West Bali, MD;  Location: [facility];  Service: Endoscopy;  Laterality: N/A;   ESOPHAGOGASTRODUODENOSCOPY (EGD) WITH ESOPHAGEAL DILATION N/A 04/01/2013   UEA:[id number] at the gastroesophageal juction/multiple small polyps/mild gastritis   GIVENS CAPSULE STUDY N/A 06/07/2017   Frequent gastric erosions, normal small bowel mucosa.  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    Social History: Social History   Socioeconomic History   Marital status:  Married    Spouse name: Doctor, general practice   Number of children: 3   Years of education: 16   Highest education level: Master's degree (e.g., MA, MS, MEng, MEd, MSW, MBA)  Occupational History   Not on file  Tobacco Use   Smoking status: Never    Passive exposure: Never   Smokeless tobacco: Never   Tobacco comments:    Never smoked   Vaping Use   Vaping status: Never Used  Substance and Sexual Activity   Alcohol use: Not Currently    Comment: occ wine   Drug use: No   Sexual activity: Not Currently  Other Topics Concern   Not on file  Social History Narrative      Lives with husband-51 years 950 in Aug 2021   Daughter is close by, 2 sons live further away   One IN [place],ONE IN [place], ONE BESIDE HER.        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   Enjoys: reading, young adult      Diet: eats all food groups    Caffeine: coffee 1, tea daily soda-1 daily   Water: 1-2 cups      Wears seat belt    Does not use phone while driving   Smoke detectors at home    Licensed conveyancer -locked up      Left handed   One story home   Drinks caffeine   Social Drivers of Health   Financial Resource Strain: Low Risk  (03/17/2023)   Overall Financial Resource Strain (CARDIA)    Difficulty of Paying Living Expenses: Not hard at all  Food Insecurity: No Food Insecurity (03/17/2023)   Hunger Vital Sign    Worried About Running Out of Food in the Last Year: Never true    Ran Out of Food in the Last Year: Never true  Transportation Needs: No Transportation Needs (03/17/2023)   PRAPARE - Administrator, Civil Service (Medical): No    Lack of Transportation (Non-Medical): No  Physical Activity: Insufficiently Active (03/17/2023)   Exercise Vital Sign    Days of Exercise per Week: 1 day    Minutes of Exercise per Session: 10 min  Stress: Stress Concern Present (03/17/2023)   [employer] of Occupational Health - Occupational Stress Questionnaire     Feeling of Stress : Very much  Social Connections: Socially Integrated (03/17/2023)   Social Connection and Isolation Panel [NHANES]    Frequency of Communication with Friends and Family: More than three times a week    Frequency of Social Gatherings with Friends and Family: Never    Attends Religious Services:  1 to 4 times per year    Active Member of Clubs or Organizations: Yes    Attends Banker Meetings: 1 to 4 times per year    Marital Status: Married  Catering manager Violence: Not At Risk (12/21/2022)   Humiliation, Afraid, Rape, and Kick questionnaire    Fear of Current or Ex-Partner: No    Emotionally Abused: No    Physically Abused: No    Sexually Abused: No    Family History: Family History  Problem Relation Age of Onset   Colon polyps Neg Hx    Colon cancer Neg Hx     Current Medications:  Current Outpatient Medications:    acetaminophen (TYLENOL) 500 MG tablet, Take 1,000 mg by mouth 2 (two) times daily as needed for moderate pain or headache., Disp: , Rfl:    acyclovir (ZOVIRAX) 400 MG tablet, TAKE ONE TABLET BY MOUTH TWICE DAILY, Disp: 60 tablet, Rfl: 6   amLODipine (NORVASC) 10 MG tablet, TAKE ONE TABLET BY MOUTH ONCE DAILY, Disp: 90 tablet, Rfl: 4   ascorbic acid (VITAMIN C) 500 MG tablet, Take 500 mg by mouth daily., Disp: , Rfl:    aspirin EC 81 MG tablet, Take 81 mg by mouth at bedtime., Disp: , Rfl:    B Complex Vitamins (VITAMIN B COMPLEX) TABS, Take 1 tablet by mouth daily., Disp: , Rfl:    B Complex-Folic Acid (B COMPLEX VITAMINS, W/ FA,) CAPS, Take 1 capsule by mouth daily., Disp: , Rfl:    bortezomib SQ (VELCADE) 3.5 MG injection, Inject into the skin once., Disp: , Rfl:    busPIRone (BUSPAR) 5 MG tablet, Take 5 mg by mouth daily., Disp: , Rfl:    Calcium Carbonate-Vitamin D (CALCIUM 600+D PO), Take 1 tablet by mouth 2 (two) times daily., Disp: , Rfl:    chlorthalidone (HYGROTON) 25 MG tablet, Take 25 mg by mouth daily., Disp: , Rfl:     cholecalciferol (VITAMIN D3) 10 MCG (400 UNIT) TABS tablet, Take 400 Units by mouth daily., Disp: , Rfl:    cyanocobalamin (VITAMIN B12) 1000 MCG tablet, Take by mouth., Disp: , Rfl:    diclofenac Sodium (VOLTAREN) 1 % GEL, Apply 4 g topically 4 (four) times daily., Disp: 50 g, Rfl: 0   fluticasone (FLONASE) 50 MCG/ACT nasal spray, USE 2 SPRAYS IN EACH NOSTRIL ONCE DAILY, Disp: 16 g, Rfl: 0   fluticasone-salmeterol (ADVAIR) 250-50 MCG/ACT AEPB, Inhale into the lungs., Disp: , Rfl:    folic acid (FOLVITE) 1 MG tablet, TAKE ONE TABLET BY MOUTH EVERY DAY, Disp: 30 tablet, Rfl: 11   gabapentin (NEURONTIN) 100 MG capsule, Take 100 mg by mouth 3 (three) times daily., Disp: , Rfl:    HYDROcodone-acetaminophen (NORCO/VICODIN) 5-325 MG tablet, TAKE ONE TABLET BY MOUTH EVERY 8 HOURS AS NEEDED FOR MODERATE PAIN, Disp: 60 tablet, Rfl: 0   hydrOXYzine (VISTARIL) 25 MG capsule, Take 1 capsule (25 mg total) by mouth at bedtime as needed., Disp: 30 capsule, Rfl: 0   Hyoscyamine Sulfate SL 0.125 MG SUBL, , Disp: , Rfl:    lenalidomide (REVLIMID) 10 MG capsule, Take 1 capsule (10 mg total) by mouth daily. 21 days on, 7 days off every 28 days, Disp: 21 capsule, Rfl: 0   levocetirizine (XYZAL) 5 MG tablet, TAKE ONE TABLET BY MOUTH IN THE EVENING, Disp: 30 tablet, Rfl: 3   lidocaine (HM LIDOCAINE PATCH) 4 %, Place 1 patch onto the skin daily., Disp: 14 patch, Rfl: 0   losartan (COZAAR)  100 MG tablet, TAKE ONE TABLET BY MOUTH ONCE DAILY, Disp: 30 tablet, Rfl: 3   magnesium oxide (MAG-OX) 400 (240 Mg) MG tablet, TAKE ONE TABLET BY MOUTH TWICE DAILY, Disp: 60 tablet, Rfl: 4   metoprolol tartrate (LOPRESSOR) 50 MG tablet, Take 1 tablet (50 mg total) by mouth 2 (two) times daily., Disp: 180 tablet, Rfl: 1   montelukast (SINGULAIR) 10 MG tablet, TAKE ONE TABLET BY MOUTH AT BEDTIME, Disp: 30 tablet, Rfl: 3   MYRBETRIQ 50 MG TB24 tablet, TAKE ONE TABLET BY MOUTH ONCE DAILY, Disp: 30 tablet, Rfl: 3   nitroGLYCERIN (NITROSTAT)  0.4 MG SL tablet, Place 0.4 mg under the tongue every 5 (five) minutes as needed for chest pain., Disp: , Rfl:    pantoprazole (PROTONIX) 40 MG tablet, TAKE ONE TABLET BY MOUTH TWICE DAILY, Disp: 90 tablet, Rfl: 3   potassium chloride SA (KLOR-CON M) 20 MEQ tablet, Take 1 tablet (20 mEq total) by mouth 2 (two) times daily., Disp: , Rfl:    primidone (MYSOLINE) 50 MG tablet, TAKE ONE TABLET BY MOUTH TWICE DAILY MORNING AND BEDTIME, Disp: 60 tablet, Rfl: 5   Probiotic Product (GNP PROBIOTIC COLON SUPPORT) CAPS, Take 1 capsule by mouth daily., Disp: , Rfl:    Saccharomyces boulardii (PROBIOTIC) 250 MG CAPS, Take 1 capsule by mouth daily., Disp: , Rfl:    sertraline (ZOLOFT) 25 MG tablet, TAKE ONE TABLET BY MOUTH ONCE DAILY, Disp: 30 tablet, Rfl: 3   simvastatin (ZOCOR) 40 MG tablet, TAKE ONE TABLET BY MOUTH AT BEDTIME, Disp: 90 tablet, Rfl: 1   tadalafil, PAH, (ALYQ) 20 MG tablet, Take 2 tablets every day by oral route., Disp: , Rfl:    topiramate (TOPAMAX) 25 MG tablet, Take 25 mg by mouth 2 (two) times daily., Disp: , Rfl:    warfarin (COUMADIN) 3 MG tablet, Take 3 mg by mouth daily., Disp: , Rfl:  No current facility-administered medications for this visit.  Facility-Administered Medications Ordered in Other Visits:    0.9 %  sodium chloride infusion, , Intravenous, Continuous, Doreatha Massed, MD, Last Rate: 500 mL/hr at 06/22/23 1304, New Bag at 06/22/23 1304   bortezomib SQ (VELCADE) chemo injection (2.5mg /mL concentration) 1.25 mg, 0.7 mg/m2 (Treatment Plan Recorded), Subcutaneous, Once, Doreatha Massed, MD   Allergies: Allergies  Allergen Reactions   Amoxicillin-Pot Clavulanate Other (See Comments)   Clarithromycin Other (See Comments)    Stomach problems   Erythromycin    Lisinopril Swelling   Amoxicillin Diarrhea    REVIEW OF SYSTEMS:   Review of Systems  Constitutional:  Negative for chills, fatigue and fever.  HENT:   Negative for lump/mass, mouth sores, nosebleeds,  sore throat and trouble swallowing.   Eyes:  Negative for eye problems.  Respiratory:  Negative for cough and shortness of breath.   Cardiovascular:  Negative for chest pain, leg swelling and palpitations.  Gastrointestinal:  Negative for abdominal pain, constipation, diarrhea, nausea and vomiting.  Genitourinary:  Negative for bladder incontinence, difficulty urinating, dysuria, frequency, hematuria and nocturia.   Musculoskeletal:  Positive for flank pain (left). Negative for arthralgias, back pain, myalgias and neck pain.  Skin:  Negative for itching and rash.  Neurological:  Negative for dizziness, headaches and numbness.  Hematological:  Does not bruise/bleed easily.  Psychiatric/Behavioral:  Negative for depression, sleep disturbance and suicidal ideas. The patient is not nervous/anxious.   All other systems reviewed and are negative.    VITALS:   Blood pressure (!) 83/53, pulse 65, temperature 98.4 F (  36.9 C), resp. rate 16, weight 137 lb (62.1 kg), SpO2 100%.  Wt Readings from Last 3 Encounters:  06/22/23 137 lb (62.1 kg)  06/08/23 142 lb 3.2 oz (64.5 kg)  05/25/23 140 lb 3.2 oz (63.6 kg)    Body mass index is 24.27 kg/m.  Performance status (ECOG): 1 - Symptomatic but completely ambulatory  PHYSICAL EXAM:   Physical Exam Vitals and nursing note reviewed. Exam conducted with a chaperone present.  Constitutional:      Appearance: Normal appearance.  Cardiovascular:     Rate and Rhythm: Normal rate and regular rhythm.     Pulses: Normal pulses.     Heart sounds: Normal heart sounds.  Pulmonary:     Effort: Pulmonary effort is normal.     Breath sounds: Normal breath sounds.  Abdominal:     Palpations: Abdomen is soft. There is no hepatomegaly, splenomegaly or mass.     Tenderness: There is no abdominal tenderness.  Musculoskeletal:     Right lower leg: No edema.     Left lower leg: No edema.  Lymphadenopathy:     Cervical: No cervical adenopathy.     Right  cervical: No superficial, deep or posterior cervical adenopathy.    Left cervical: No superficial, deep or posterior cervical adenopathy.     Upper Body:     Right upper body: No supraclavicular or axillary adenopathy.     Left upper body: No supraclavicular or axillary adenopathy.  Neurological:     General: No focal deficit present.     Mental Status: She is alert and oriented to person, place, and time.  Psychiatric:        Mood and Affect: Mood normal.        Behavior: Behavior normal.     LABS:      Latest Ref Rng & Units 06/22/2023   11:07 AM 06/08/2023   11:37 AM 05/25/2023   12:16 PM  CBC  WBC 4.0 - 10.5 K/uL 2.7  3.9  4.0   Hemoglobin 12.0 - 15.0 g/dL 29.5  28.4  13.2   Hematocrit 36.0 - 46.0 % 35.0  36.5  34.6   Platelets 150 - 400 K/uL 84  123  139       Latest Ref Rng & Units 06/22/2023   11:07 AM 06/08/2023   11:37 AM 05/25/2023   12:16 PM  CMP  Glucose 70 - 99 mg/dL 92  69  94   BUN 8 - 23 mg/dL 17  18  16    Creatinine 0.44 - 1.00 mg/dL 4.40  1.02  7.25   Sodium 135 - 145 mmol/L 138  139  135   Potassium 3.5 - 5.1 mmol/L 3.5  3.1  3.3   Chloride 98 - 111 mmol/L 104  104  103   CO2 22 - 32 mmol/L 24  27  24    Calcium 8.9 - 10.3 mg/dL 8.6  9.0  9.1   Total Protein 6.5 - 8.1 g/dL 6.7  6.9  7.1   Total Bilirubin 0.0 - 1.2 mg/dL 0.6  0.4  0.6   Alkaline Phos 38 - 126 U/L 35  43  45   AST 15 - 41 U/L 30  20  18    ALT 0 - 44 U/L 22  17  14       No results found for: "CEA1", "CEA" / No results found for: "CEA1", "CEA" No results found for: "PSA1" No results found for: "DGU440" No results found for: "HKV425"  Lab Results  Component Value Date   TOTALPROTELP 6.4 06/08/2023   ALBUMINELP 3.8 06/08/2023   A1GS 0.2 06/08/2023   A2GS 0.6 06/08/2023   BETS 1.0 06/08/2023   GAMS 0.8 06/08/2023   MSPIKE 0.3 (H) 06/08/2023   SPEI Comment 06/08/2023   Lab Results  Component Value Date   TIBC 291 06/09/2021   TIBC 297 01/21/2021   TIBC 302 10/21/2020   FERRITIN  279 06/09/2021   FERRITIN 216 01/21/2021   FERRITIN 274 10/21/2020   IRONPCTSAT 24 06/09/2021   IRONPCTSAT 19 01/21/2021   IRONPCTSAT 28 10/21/2020   Lab Results  Component Value Date   LDH 129 03/02/2022   LDH 121 12/14/2021   LDH 113 11/09/2021     STUDIES:   No results found.

## 2023-06-22 ENCOUNTER — Inpatient Hospital Stay: Payer: Medicare Other

## 2023-06-22 ENCOUNTER — Inpatient Hospital Stay: Payer: Medicare Other | Admitting: Hematology

## 2023-06-22 VITALS — BP 83/53 | HR 65 | Temp 98.4°F | Resp 16 | Wt 137.0 lb

## 2023-06-22 VITALS — BP 91/58

## 2023-06-22 DIAGNOSIS — C9000 Multiple myeloma not having achieved remission: Secondary | ICD-10-CM

## 2023-06-22 LAB — COMPREHENSIVE METABOLIC PANEL
ALT: 22 U/L (ref 0–44)
AST: 30 U/L (ref 15–41)
Albumin: 3.8 g/dL (ref 3.5–5.0)
Alkaline Phosphatase: 35 U/L — ABNORMAL LOW (ref 38–126)
Anion gap: 10 (ref 5–15)
BUN: 17 mg/dL (ref 8–23)
CO2: 24 mmol/L (ref 22–32)
Calcium: 8.6 mg/dL — ABNORMAL LOW (ref 8.9–10.3)
Chloride: 104 mmol/L (ref 98–111)
Creatinine, Ser: 0.93 mg/dL (ref 0.44–1.00)
GFR, Estimated: >60 mL/min (ref >60)
Glucose, Bld: 92 mg/dL (ref 70–99)
Potassium: 3.5 mmol/L (ref 3.5–5.1)
Sodium: 138 mmol/L (ref 135–145)
Total Bilirubin: 0.6 mg/dL (ref 0.0–1.2)
Total Protein: 6.7 g/dL (ref 6.5–8.1)

## 2023-06-22 LAB — CBC WITH DIFFERENTIAL/PLATELET
Abs Immature Granulocytes: 0 10*3/uL (ref 0.00–0.07)
Basophils Absolute: 0 10*3/uL (ref 0.0–0.1)
Basophils Relative: 0 %
Eosinophils Absolute: 0 10*3/uL (ref 0.0–0.5)
Eosinophils Relative: 2 %
HCT: 35 % — ABNORMAL LOW (ref 36.0–46.0)
Hemoglobin: 11.7 g/dL — ABNORMAL LOW (ref 12.0–15.0)
Immature Granulocytes: 0 %
Lymphocytes Relative: 37 %
Lymphs Abs: 1 10*3/uL (ref 0.7–4.0)
MCH: 34.9 pg — ABNORMAL HIGH (ref 26.0–34.0)
MCHC: 33.4 g/dL (ref 30.0–36.0)
MCV: 104.5 fL — ABNORMAL HIGH (ref 80.0–100.0)
Monocytes Absolute: 0.5 10*3/uL (ref 0.1–1.0)
Monocytes Relative: 17 %
Neutro Abs: 1.2 10*3/uL — ABNORMAL LOW (ref 1.7–7.7)
Neutrophils Relative %: 44 %
Platelets: 84 10*3/uL — ABNORMAL LOW (ref 150–400)
RBC: 3.35 MIL/uL — ABNORMAL LOW (ref 3.87–5.11)
RDW: 13.7 % (ref 11.5–15.5)
Smear Review: DECREASED
WBC: 2.7 10*3/uL — ABNORMAL LOW (ref 4.0–10.5)
nRBC: 0 % (ref 0.0–0.2)

## 2023-06-22 MED ORDER — BORTEZOMIB CHEMO SQ INJECTION 3.5 MG (2.5MG/ML)
0.7000 mg/m2 | Freq: Once | INTRAMUSCULAR | Status: AC
  Administered 2023-06-22: 1.25 mg via SUBCUTANEOUS
  Filled 2023-06-22: qty 0.5

## 2023-06-22 MED ORDER — DEXAMETHASONE 4 MG PO TABS
20.0000 mg | ORAL_TABLET | Freq: Once | ORAL | Status: AC
  Administered 2023-06-22: 20 mg via ORAL
  Filled 2023-06-22: qty 5

## 2023-06-22 MED ORDER — SODIUM CHLORIDE 0.9 % IV SOLN: INTRAVENOUS | Status: DC

## 2023-06-22 MED ORDER — PROCHLORPERAZINE MALEATE 10 MG PO TABS
10.0000 mg | ORAL_TABLET | Freq: Once | ORAL | Status: AC
Start: 2023-06-22 — End: 2023-06-22
  Administered 2023-06-22: 10 mg via ORAL
  Filled 2023-06-22: qty 1

## 2023-06-22 NOTE — Patient Instructions (Signed)
[facility] at [facility] Discharge Instructions   You were seen and examined today by Dr. Ellin Saba.  He reviewed the results of your lab work which are normal/stable.   We will proceed with your treatment today.  Return as scheduled.    Thank you for choosing [facility] at [facility] to provide your oncology and hematology care.  To afford each patient quality time with our provider, please arrive at least 15 minutes before your scheduled appointment time.   If you have a lab appointment with the Cancer Center please come in thru the Main Entrance and check in at the main information desk.  You need to re-schedule your appointment should you arrive 10 or more minutes late.  We strive to give you quality time with our providers, and arriving late affects you and other patients whose appointments are after yours.  Also, if you no show three or more times for appointments you may be dismissed from the clinic at the providers discretion.     Again, thank you for choosing [facility].  Our hope is that these requests will decrease the amount of time that you wait before being seen by our physicians.       _____________________________________________________________  Should you have questions after your visit to [facility], please contact our office at [phone] and follow the prompts.  Our office hours are 8:00 a.m. and 4:30 p.m. Monday - Friday.  Please note that voicemails left after 4:00 p.m. may not be returned until the following business day.  We are closed weekends and major holidays.  You do have access to a nurse 24-7, just call the main number to the clinic [phone] and do not press any options, hold on the line and a nurse will answer the phone.    For prescription refill requests, have your pharmacy contact our office and allow 72 hours.    Due to Covid, you will need to wear a mask upon entering  the hospital. If you do not have a mask, a mask will be given to you at the Main Entrance upon arrival. For doctor visits, patients may have 1 support person age 75 or older with them. For treatment visits, patients can not have anyone with them due to social distancing guidelines and our immunocompromised population.

## 2023-06-22 NOTE — Progress Notes (Signed)
Patient presents today for velcade injection. ANC 1.2. Platelets 84,000. Message received from A.Dareen Piano RN / Dr. Ellin Saba to proceed with treatment.   Verbal orders received from Dr. Ellin Saba to proceed with treatment. Reported ANC 1.2, Platelets 84,000 and blood pressure 83/53. Give 500 ml bolus of normal saline over 1 hour per Dr. Ellin Saba telephone orders.

## 2023-06-22 NOTE — Progress Notes (Signed)
Patient presents today for Velcade. BP 83/53, Platelets 84,000 ANC 1.2, per Dr. Ellin Saba patient okay for treatment with additional order for normal saline bolus. Patient tolerated bolus with no complaints noted, BP 91/58 asymptomatic. Patient states she is on blood pressure medication at home. Patient given written copy of blood pressures and advised to call PCP to let them know what her blood pressures have been running, patient verbalizes understanding and states she will call them today.  Patient tolerated Velcade injection with no complaints voiced. Lab work reviewed. See MAR for details. Injection site clean and dry with no bruising or swelling noted. Patient stable during and after injection. Band aid applied. VSS. Patient left in satisfactory condition with no s/s of distress noted.

## 2023-06-22 NOTE — Patient Instructions (Signed)
[facility] [place] - A DEPT OF [facility]  Discharge Instructions: Thank you for choosing [facility] to provide your oncology and hematology care.  If you have a lab appointment with the Cancer Center - please note that after April 8th, 2024, all labs will be drawn in the cancer center.  You do not have to check in or register with the main entrance as you have in the past but will complete your check-in in the cancer center.  Wear comfortable clothing and clothing appropriate for easy access to any Portacath or PICC line.   We strive to give you quality time with your provider. You may need to reschedule your appointment if you arrive late (15 or more minutes).  Arriving late affects you and other patients whose appointments are after yours.  Also, if you miss three or more appointments without notifying the office, you may be dismissed from the clinic at the provider's discretion.      For prescription refill requests, have your pharmacy contact our office and allow 72 hours for refills to be completed.    Today you received the following chemotherapy and/or immunotherapy agents Velcade and normal saline bolus, return as scheduled.   To help prevent nausea and vomiting after your treatment, we encourage you to take your nausea medication as directed.  BELOW ARE SYMPTOMS THAT SHOULD BE REPORTED IMMEDIATELY: *FEVER GREATER THAN 100.4 F (38 C) OR HIGHER *CHILLS OR SWEATING *NAUSEA AND VOMITING THAT IS NOT CONTROLLED WITH YOUR NAUSEA MEDICATION *UNUSUAL SHORTNESS OF BREATH *UNUSUAL BRUISING OR BLEEDING *URINARY PROBLEMS (pain or burning when urinating, or frequent urination) *BOWEL PROBLEMS (unusual diarrhea, constipation, pain near the anus) TENDERNESS IN MOUTH AND THROAT WITH OR WITHOUT PRESENCE OF ULCERS (sore throat, sores in mouth, or a toothache) UNUSUAL RASH, SWELLING OR PAIN  UNUSUAL VAGINAL DISCHARGE OR ITCHING   Items with * indicate a  potential emergency and should be followed up as soon as possible or go to the Emergency Department if any problems should occur.  Please show the CHEMOTHERAPY ALERT CARD or IMMUNOTHERAPY ALERT CARD at check-in to the Emergency Department and triage nurse.  Should you have questions after your visit or need to cancel or reschedule your appointment, please contact [facility]  - A DEPT OF [facility] [phone]  and follow the prompts.  Office hours are 8:00 a.m. to 4:30 p.m. Monday - Friday. Please note that voicemails left after 4:00 p.m. may not be returned until the following business day.  We are closed weekends and major holidays. You have access to a nurse at all times for urgent questions. Please call the main number to the clinic [phone] and follow the prompts.  For any non-urgent questions, you may also contact your provider using MyChart. We now offer e-Visits for anyone 75 and older to request care online for non-urgent symptoms. For details visit [email or web address].   Also download the MyChart app! Go to the app store, search "MyChart", open the app, select [place], and log in with your MyChart username and password.

## 2023-06-27 ENCOUNTER — Ambulatory Visit: Payer: Medicare Other | Admitting: Internal Medicine

## 2023-06-27 ENCOUNTER — Encounter: Payer: Self-pay | Admitting: Internal Medicine

## 2023-06-27 VITALS — BP 99/67 | HR 98 | Wt 135.4 lb

## 2023-06-27 DIAGNOSIS — M5412 Radiculopathy, cervical region: Secondary | ICD-10-CM | POA: Diagnosis not present

## 2023-06-27 MED ORDER — PREDNISONE 20 MG PO TABS: 40.0000 mg | ORAL_TABLET | Freq: Every day | ORAL | 0 refills | Status: DC

## 2023-06-27 NOTE — Patient Instructions (Signed)
Please take Prednisone as prescribed.  Please take Gabapentin as prescribed by your Neurologist.  Please get x-ray of Cervical spine at [facility].

## 2023-06-27 NOTE — Assessment & Plan Note (Signed)
 Left-sided neck and shoulder pain likely due to cervical radiculopathy, also reports burning pain over left upper arm area Continue gabapentin  100 mg 3 times daily as prescribed by neurology Prednisone  40 mg QD x 5 days Would avoid muscle relaxer considering her baseline mental status/cognition, can increase drowsiness Lidocaine  patch for local pain relief No recent imaging of cervical spine available for review - check x-ray of cervical spine

## 2023-06-27 NOTE — Progress Notes (Signed)
 Acute Office Visit  Subjective:    Patient ID: Courtney Rogers, female    DOB: May 24, 1950, 73 y.o.   MRN: [id number]  Chief Complaint  Patient presents with   Shoulder Pain    Reports shoulder pain on her left side, causing neck pain.     HPI Patient is in today for complaint of left-sided neck pain, chronic,  constant, sharp, radiating towards left shoulder.  She also reports burning around the left upper arm area.  She is taking gabapentin  100 mg 3 times daily, followed by neurology currently.  She also has lidocaine  patch over her left arm currently.  She denies any recent injury.  She has history of IgA myeloma, undergoing chemotherapy.  She appears to be confused, but at baseline according to chart review.  She has come by herself today, through a local transport facility.  History is limited due to cognition concerns.  Past Medical History:  Diagnosis Date   Acute myocardial infarction [facility]) 2009   CAD/no stent medically managed   Anxiety disorder    CAD (coronary artery disease)    Cancer (HCC)    multiple myeloma   Hyperlipidemia    Hypertension    IBS (irritable bowel syndrome)    Non-ulcer dyspepsia 04/30/2009   Qualifier: Diagnosis of  By: Harvey MD, Sandi L    Overactive bladder    PE (pulmonary embolism) 10/2012   left lung   Presence of permanent cardiac pacemaker    Sleep apnea     Past Surgical History:  Procedure Laterality Date   ABDOMINAL HYSTERECTOMY     BSO secondary to cyst     CARDIAC CATHETERIZATION     COLONOSCOPY  12/2009   Dr. Rhoda, propofol , normal. Next TCS 12/2019   COLONOSCOPY N/A 05/22/2017   Examined portion ileum was normal, significant looping of colon, external hemorrhoids and rectal bleeding due to internal hemorrhoids, mild diverticulosis procedure: COLONOSCOPY;  Surgeon: Harvey Margo CROME, MD;  [facility];  Service: Endoscopy;  Laterality: N/A;  10:30am   ESOPHAGOGASTRODUODENOSCOPY  05/11/2009   schatzki  ring/small hiatal hernia/path:gastritis   ESOPHAGOGASTRODUODENOSCOPY N/A 05/22/2017   Multiple gastric polyps with biopsy benign fundic gland polyp, small bowel biopsy negative for celiac, gastric biopsy with mild gastritis but no H. pylori procedure: ESOPHAGOGASTRODUODENOSCOPY (EGD);  Surgeon: Harvey Margo CROME, MD;  Location: [facility];  Service: Endoscopy;  Laterality: N/A;   ESOPHAGOGASTRODUODENOSCOPY (EGD) WITH ESOPHAGEAL DILATION N/A 04/01/2013   DOQ:dumprulmz at the gastroesophageal juction/multiple small polyps/mild gastritis   GIVENS CAPSULE STUDY N/A 06/07/2017   Frequent gastric erosions, normal small bowel mucosa.  Procedure: GIVENS CAPSULE STUDY;  Surgeon: Harvey Margo CROME, MD;  Location: [facility];  Service: Endoscopy;  Laterality: N/A;  7:30am   INSERT / REPLACE / REMOVE PACEMAKER     Last year per pt.(can't remember date)   REPLACEMENT TOTAL KNEE Right 08/2020    Family History  Problem Relation Age of Onset   Colon polyps Neg Hx    Colon cancer Neg Hx     Social History   Socioeconomic History   Marital status: Married    Spouse name: Doretta   Number of children: 3   Years of education: 16   Highest education level: Master's degree (e.g., MA, MS, MEng, MEd, MSW, MBA)  Occupational History   Not on file  Tobacco Use   Smoking status: Never    Passive exposure: Never   Smokeless tobacco: Never   Tobacco comments:  Never smoked   Vaping Use   Vaping status: Never Used  Substance and Sexual Activity   Alcohol use: Not Currently    Comment: occ wine   Drug use: No   Sexual activity: Not Currently  Other Topics Concern   Not on file  Social History Narrative      Lives with husband-51 years 952 in Aug 2021   Daughter is close by, 2 sons live further away   One IN [place],ONE IN [place], ONE BESIDE HER.        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   Enjoys: reading, young adult      Diet: eats all food groups    Caffeine: coffee 1, tea  daily soda-1 daily   Water : 1-2 cups      Wears seat belt    Does not use phone while driving   Smoke detectors at home    Licensed Conveyancer -locked up      Left handed   One story home   Drinks caffeine   Social Drivers of Health   Financial Resource Strain: Low Risk  (06/26/2023)   Overall Financial Resource Strain (CARDIA)    Difficulty of Paying Living Expenses: Not hard at all  Food Insecurity: No Food Insecurity (06/26/2023)   Hunger Vital Sign    Worried About Running Out of Food in the Last Year: Never true    Ran Out of Food in the Last Year: Never true  Transportation Needs: No Transportation Needs (06/26/2023)   PRAPARE - Administrator, Civil Service (Medical): No    Lack of Transportation (Non-Medical): No  Physical Activity: Insufficiently Active (06/26/2023)   Exercise Vital Sign    Days of Exercise per Week: 2 days    Minutes of Exercise per Session: 20 min  Stress: Stress Concern Present (06/26/2023)   [employer] of Occupational Health - Occupational Stress Questionnaire    Feeling of Stress : To some extent  Social Connections: Socially Integrated (06/26/2023)   Social Connection and Isolation Panel [NHANES]    Frequency of Communication with Friends and Family: Twice a week    Frequency of Social Gatherings with Friends and Family: Once a week    Attends Religious Services: 1 to 4 times per year    Active Member of [employer] or Organizations: Yes    Attends Banker Meetings: 1 to 4 times per year    Marital Status: Married  Catering Manager Violence: Not At Risk (12/21/2022)   Humiliation, Afraid, Rape, and Kick questionnaire    Fear of Current or Ex-Partner: No    Emotionally Abused: No    Physically Abused: No    Sexually Abused: No    Outpatient Medications Prior to Visit  Medication Sig Dispense Refill   acetaminophen  (TYLENOL ) 500 MG tablet Take 1,000 mg by mouth 2 (two) times daily as needed for moderate pain or  headache.     acyclovir  (ZOVIRAX ) 400 MG tablet TAKE ONE TABLET BY MOUTH TWICE DAILY 60 tablet 6   amLODipine  (NORVASC ) 10 MG tablet TAKE ONE TABLET BY MOUTH ONCE DAILY 90 tablet 4   ascorbic acid (VITAMIN C) 500 MG tablet Take 500 mg by mouth daily.     aspirin EC 81 MG tablet Take 81 mg by mouth at bedtime.     B Complex Vitamins (VITAMIN B COMPLEX) TABS Take 1 tablet by mouth daily.     B Complex-Folic Acid  (B COMPLEX VITAMINS, W/  FA,) CAPS Take 1 capsule by mouth daily.     bortezomib  SQ (VELCADE ) 3.5 MG injection Inject into the skin once.     busPIRone  (BUSPAR ) 5 MG tablet Take 5 mg by mouth daily.     Calcium  Carbonate-Vitamin D  (CALCIUM  600+D PO) Take 1 tablet by mouth 2 (two) times daily.     chlorthalidone (HYGROTON) 25 MG tablet Take 25 mg by mouth daily.     cholecalciferol  (VITAMIN D3) 10 MCG (400 UNIT) TABS tablet Take 400 Units by mouth daily.     cyanocobalamin  (VITAMIN B12) 1000 MCG tablet Take by mouth.     diclofenac  Sodium (VOLTAREN ) 1 % GEL Apply 4 g topically 4 (four) times daily. 50 g 0   fluticasone  (FLONASE ) 50 MCG/ACT nasal spray USE 2 SPRAYS IN EACH NOSTRIL ONCE DAILY 16 g 0   fluticasone -salmeterol (ADVAIR) 250-50 MCG/ACT AEPB Inhale into the lungs.     folic acid  (FOLVITE ) 1 MG tablet TAKE ONE TABLET BY MOUTH EVERY DAY 30 tablet 11   gabapentin  (NEURONTIN ) 100 MG capsule Take 100 mg by mouth 3 (three) times daily.     HYDROcodone -acetaminophen  (NORCO/VICODIN) 5-325 MG tablet TAKE ONE TABLET BY MOUTH EVERY 8 HOURS AS NEEDED FOR MODERATE PAIN 60 tablet 0   hydrOXYzine  (VISTARIL ) 25 MG capsule Take 1 capsule (25 mg total) by mouth at bedtime as needed. 30 capsule 0   Hyoscyamine  Sulfate SL 0.125 MG SUBL      lenalidomide  (REVLIMID ) 10 MG capsule Take 1 capsule (10 mg total) by mouth daily. 21 days on, 7 days off every 28 days 21 capsule 0   levocetirizine (XYZAL ) 5 MG tablet TAKE ONE TABLET BY MOUTH IN THE EVENING 30 tablet 3   lidocaine  (HM LIDOCAINE  PATCH) 4 % Place  1 patch onto the skin daily. 14 patch 0   losartan  (COZAAR ) 100 MG tablet TAKE ONE TABLET BY MOUTH ONCE DAILY 30 tablet 3   magnesium  oxide (MAG-OX) 400 (240 Mg) MG tablet TAKE ONE TABLET BY MOUTH TWICE DAILY 60 tablet 4   metoprolol  tartrate (LOPRESSOR ) 50 MG tablet Take 1 tablet (50 mg total) by mouth 2 (two) times daily. 180 tablet 1   montelukast  (SINGULAIR ) 10 MG tablet TAKE ONE TABLET BY MOUTH AT BEDTIME 30 tablet 3   MYRBETRIQ  50 MG TB24 tablet TAKE ONE TABLET BY MOUTH ONCE DAILY 30 tablet 3   nitroGLYCERIN (NITROSTAT) 0.4 MG SL tablet Place 0.4 mg under the tongue every 5 (five) minutes as needed for chest pain.     pantoprazole  (PROTONIX ) 40 MG tablet TAKE ONE TABLET BY MOUTH TWICE DAILY 90 tablet 3   potassium chloride  SA (KLOR-CON  M) 20 MEQ tablet Take 1 tablet (20 mEq total) by mouth 2 (two) times daily.     primidone (MYSOLINE) 50 MG tablet TAKE ONE TABLET BY MOUTH TWICE DAILY MORNING AND BEDTIME 60 tablet 5   Probiotic Product (GNP PROBIOTIC COLON SUPPORT) CAPS Take 1 capsule by mouth daily.     Saccharomyces boulardii (PROBIOTIC) 250 MG CAPS Take 1 capsule by mouth daily.     sertraline  (ZOLOFT ) 25 MG tablet TAKE ONE TABLET BY MOUTH ONCE DAILY 30 tablet 3   simvastatin  (ZOCOR ) 40 MG tablet TAKE ONE TABLET BY MOUTH AT BEDTIME 90 tablet 1   tadalafil, PAH, (ALYQ) 20 MG tablet Take 2 tablets every day by oral route.     topiramate (TOPAMAX) 25 MG tablet Take 25 mg by mouth 2 (two) times daily.     warfarin (  COUMADIN) 3 MG tablet Take 3 mg by mouth daily.     No facility-administered medications prior to visit.    Allergies  Allergen Reactions   Amoxicillin-Pot Clavulanate Other (See Comments)   Clarithromycin Other (See Comments)    Stomach problems   Erythromycin    Lisinopril Swelling   Amoxicillin Diarrhea    Review of Systems  Constitutional:  Negative for chills and fever.  HENT:  Negative for congestion, sinus pressure, sinus pain and sore throat.   Eyes:  Negative  for pain and discharge.  Respiratory:  Negative for cough and shortness of breath.   Cardiovascular:  Negative for chest pain and palpitations.  Gastrointestinal:  Negative for abdominal pain, diarrhea, nausea and vomiting.  Endocrine: Negative for polydipsia and polyuria.  Genitourinary:  Negative for dysuria and hematuria.  Musculoskeletal:  Positive for arthralgias (Left shoulder) and neck pain. Negative for neck stiffness.  Skin:  Negative for rash.  Neurological:  Negative for dizziness and weakness.  Psychiatric/Behavioral:  Negative for agitation and behavioral problems.        Objective:    Physical Exam Vitals reviewed.  Constitutional:      General: She is not in acute distress.    Appearance: She is not diaphoretic.  HENT:     Head: Normocephalic and atraumatic.     Nose: Nose normal.     Mouth/Throat:     Mouth: Mucous membranes are moist.  Eyes:     General: No scleral icterus.    Extraocular Movements: Extraocular movements intact.  Cardiovascular:     Rate and Rhythm: Normal rate and regular rhythm.     Heart sounds: Normal heart sounds. No murmur heard. Pulmonary:     Breath sounds: Normal breath sounds. No wheezing or rales.  Musculoskeletal:     Cervical back: Neck supple. Tenderness present.     Right lower leg: No edema.     Left lower leg: No edema.  Skin:    General: Skin is warm.     Findings: No rash.  Neurological:     General: No focal deficit present.     Mental Status: She is alert and oriented to person, place, and time.  Psychiatric:        Mood and Affect: Mood normal.        Behavior: Behavior normal.     BP 99/67   Pulse 98   Wt 135 lb 6.4 oz (61.4 kg)   SpO2 94%   BMI 23.99 kg/m  Wt Readings from Last 3 Encounters:  06/27/23 135 lb 6.4 oz (61.4 kg)  06/22/23 137 lb (62.1 kg)  06/08/23 142 lb 3.2 oz (64.5 kg)        Assessment & Plan:   Problem List Items Addressed This Visit       Nervous and Auditory   Cervical  radiculopathy - Primary   Left-sided neck and shoulder pain likely due to cervical radiculopathy, also reports burning pain over left upper arm area Continue gabapentin  100 mg 3 times daily as prescribed by neurology Prednisone  40 mg QD x 5 days Would avoid muscle relaxer considering her baseline mental status/cognition, can increase drowsiness Lidocaine  patch for local pain relief No recent imaging of cervical spine available for review - check x-ray of cervical spine      Relevant Medications   predniSONE  (DELTASONE ) 20 MG tablet   Other Relevant Orders   DG Cervical Spine Complete     Meds ordered this encounter  Medications  predniSONE  (DELTASONE ) 20 MG tablet    Sig: Take 2 tablets (40 mg total) by mouth daily with breakfast.    Dispense:  10 tablet    Refill:  0     Amanpreet Delmont MARLA Blanch, MD

## 2023-07-04 ENCOUNTER — Encounter (HOSPITAL_COMMUNITY): Payer: Self-pay | Admitting: Hematology

## 2023-07-04 ENCOUNTER — Other Ambulatory Visit: Payer: Self-pay | Admitting: Hematology

## 2023-07-04 ENCOUNTER — Other Ambulatory Visit: Payer: Self-pay | Admitting: Family Medicine

## 2023-07-04 ENCOUNTER — Encounter: Payer: Self-pay | Admitting: Hematology

## 2023-07-04 DIAGNOSIS — F339 Major depressive disorder, recurrent, unspecified: Secondary | ICD-10-CM

## 2023-07-04 DIAGNOSIS — E7849 Other hyperlipidemia: Secondary | ICD-10-CM

## 2023-07-05 ENCOUNTER — Inpatient Hospital Stay: Payer: Medicare Other | Attending: Hematology

## 2023-07-05 ENCOUNTER — Inpatient Hospital Stay: Payer: Medicare Other

## 2023-07-05 VITALS — BP 113/59 | HR 63 | Temp 97.0°F | Resp 16

## 2023-07-05 DIAGNOSIS — Z5112 Encounter for antineoplastic immunotherapy: Secondary | ICD-10-CM | POA: Insufficient documentation

## 2023-07-05 DIAGNOSIS — G629 Polyneuropathy, unspecified: Secondary | ICD-10-CM | POA: Diagnosis not present

## 2023-07-05 DIAGNOSIS — C9000 Multiple myeloma not having achieved remission: Secondary | ICD-10-CM

## 2023-07-05 DIAGNOSIS — Z79899 Other long term (current) drug therapy: Secondary | ICD-10-CM | POA: Diagnosis not present

## 2023-07-05 DIAGNOSIS — Z86711 Personal history of pulmonary embolism: Secondary | ICD-10-CM | POA: Diagnosis not present

## 2023-07-05 LAB — CBC WITH DIFFERENTIAL/PLATELET
Abs Immature Granulocytes: 0.01 10*3/uL (ref 0.00–0.07)
Basophils Absolute: 0 10*3/uL (ref 0.0–0.1)
Basophils Relative: 0 %
Eosinophils Absolute: 0 10*3/uL (ref 0.0–0.5)
Eosinophils Relative: 1 %
HCT: 38.8 % (ref 36.0–46.0)
Hemoglobin: 13.1 g/dL (ref 12.0–15.0)
Immature Granulocytes: 0 %
Lymphocytes Relative: 26 %
Lymphs Abs: 1.2 10*3/uL (ref 0.7–4.0)
MCH: 35.9 pg — ABNORMAL HIGH (ref 26.0–34.0)
MCHC: 33.8 g/dL (ref 30.0–36.0)
MCV: 106.3 fL — ABNORMAL HIGH (ref 80.0–100.0)
Monocytes Absolute: 0.5 10*3/uL (ref 0.1–1.0)
Monocytes Relative: 11 %
Neutro Abs: 3 10*3/uL (ref 1.7–7.7)
Neutrophils Relative %: 62 %
Platelets: 157 10*3/uL (ref 150–400)
RBC: 3.65 MIL/uL — ABNORMAL LOW (ref 3.87–5.11)
RDW: 13.9 % (ref 11.5–15.5)
WBC: 4.9 10*3/uL (ref 4.0–10.5)
nRBC: 0 % (ref 0.0–0.2)

## 2023-07-05 LAB — COMPREHENSIVE METABOLIC PANEL
ALT: 24 U/L (ref 0–44)
AST: 27 U/L (ref 15–41)
Albumin: 4 g/dL (ref 3.5–5.0)
Alkaline Phosphatase: 39 U/L (ref 38–126)
Anion gap: 10 (ref 5–15)
BUN: 19 mg/dL (ref 8–23)
CO2: 27 mmol/L (ref 22–32)
Calcium: 9.2 mg/dL (ref 8.9–10.3)
Chloride: 103 mmol/L (ref 98–111)
Creatinine, Ser: 0.87 mg/dL (ref 0.44–1.00)
GFR, Estimated: >60 mL/min (ref >60)
Glucose, Bld: 88 mg/dL (ref 70–99)
Potassium: 3.1 mmol/L — ABNORMAL LOW (ref 3.5–5.1)
Sodium: 140 mmol/L (ref 135–145)
Total Bilirubin: 0.5 mg/dL (ref 0.0–1.2)
Total Protein: 7.1 g/dL (ref 6.5–8.1)

## 2023-07-05 LAB — GLUCOSE, CAPILLARY: Glucose-Capillary: 88 mg/dL (ref 70–99)

## 2023-07-05 MED ORDER — POTASSIUM CHLORIDE CRYS ER 20 MEQ PO TBCR
40.0000 meq | EXTENDED_RELEASE_TABLET | Freq: Once | ORAL | Status: AC
  Administered 2023-07-05: 40 meq via ORAL
  Filled 2023-07-05: qty 2

## 2023-07-05 MED ORDER — ACETAMINOPHEN 325 MG PO TABS
650.0000 mg | ORAL_TABLET | Freq: Once | ORAL | Status: AC
Start: 2023-07-05 — End: 2023-07-05
  Administered 2023-07-05: 650 mg via ORAL
  Filled 2023-07-05: qty 2

## 2023-07-05 MED ORDER — PROCHLORPERAZINE MALEATE 10 MG PO TABS
10.0000 mg | ORAL_TABLET | Freq: Once | ORAL | Status: AC
  Administered 2023-07-05: 10 mg via ORAL
  Filled 2023-07-05: qty 1

## 2023-07-05 MED ORDER — BORTEZOMIB CHEMO SQ INJECTION 3.5 MG (2.5MG/ML)
0.7000 mg/m2 | Freq: Once | INTRAMUSCULAR | Status: AC
  Administered 2023-07-05: 1.25 mg via SUBCUTANEOUS
  Filled 2023-07-05: qty 0.5

## 2023-07-05 NOTE — Progress Notes (Signed)
Patient presents today for Velcade infusion per providers order.  Vital signs WNL.  Labs pending.  Patient complaining of headache (pain behind her eyes) and dizziness.  CBG taken and was 88.   Message received from Chapman Moss RN/Dr. Ellin Saba okay to give patient 650 mg tylenol PO for headache.  Potassium noted to be 3.1, patient will receive of PO Potassium with treatment.  Per MD, okay to proceed with Velcade.  Stable duriing administration without incident; injection site WNL; see MAR for injection details.  Patient tolerated procedure well and without incident.  No questions or complaints noted at this time. Marland Kitchen  Discharge from clinic via wheelchair in stable condition.  Alert and oriented X 3.  Follow up with [facility] as scheduled.

## 2023-07-05 NOTE — Patient Instructions (Signed)
[facility] [place] - [facility] DE[facility]  Discharge Instructions: Thank you for choosing [facility] to provide your oncology and hematology care.  If you have a lab appointment with the Cancer Center - please note that after April 8th, 2024, all labs will be drawn in the cancer center.  You do not have to check in or register with the main entrance as you have in the past but will complete your check-in in the cancer center.  Wear comfortable clothing and clothing appropriate for easy access to any Portacath or PICC line.   We strive to give you quality time with your provider. You may need to reschedule your appointment if you arrive late (15 or more minutes).  Arriving late affects you and other patients whose appointments are after yours.  Also, if you miss three or more appointments without notifying the office, you may be dismissed from the clinic at the provider's discretion.      For prescription refill requests, have your pharmacy contact our office and allow 72 hours for refills to be completed.    Today you received the following chemotherapy and/or immunotherapy agents Velcade      To help prevent nausea and vomiting after your treatment, we encourage you to take your nausea medication as directed.  BELOW ARE SYMPTOMS THAT SHOULD BE REPORTED IMMEDIATELY: *FEVER GREATER THAN 100.4 F (38 C) OR HIGHER *CHILLS OR SWEATING *NAUSEA AND VOMITING THAT IS NOT CONTROLLED WITH YOUR NAUSEA MEDICATION *UNUSUAL SHORTNESS OF BREATH *UNUSUAL BRUISING OR BLEEDING *URINARY PROBLEMS (pain or burning when urinating, or frequent urination) *BOWEL PROBLEMS (unusual diarrhea, constipation, pain near the anus) TENDERNESS IN MOUTH AND THROAT WITH OR WITHOUT PRESENCE OF ULCERS (sore throat, sores in mouth, or a toothache) UNUSUAL RASH, SWELLING OR PAIN  UNUSUAL VAGINAL DISCHARGE OR ITCHING   Items with * indicate a potential emergency and should be followed up  as soon as possible or go to the Emergency Department if any problems should occur.  Please show the CHEMOTHERAPY ALERT CARD or IMMUNOTHERAPY ALERT CARD at check-in to the Emergency Department and triage nurse.  Should you have questions after your visit or need to cancel or reschedule your appointment, please contact [facility] [place] - A DEPT OF [facility] [phone]  and follow the prompts.  Office hours are 8:00 a.m. to 4:30 p.m. Monday - Friday. Please note that voicemails left after 4:00 p.m. may not be returned until the following business day.  We are closed weekends and major holidays. You have access to a nurse at all times for urgent questions. Please call the main number to the clinic [phone] and follow the prompts.  For any non-urgent questions, you may also contact your provider using MyChart. We now offer e-Visits for anyone 44 and older to request care online for non-urgent symptoms. For details visit [email or web address].   Also download the MyChart app! Go to the app store, search "MyChart", open the app, select [place], and log in with your MyChart username and password.

## 2023-07-06 ENCOUNTER — Inpatient Hospital Stay: Payer: Medicare Other

## 2023-07-17 ENCOUNTER — Ambulatory Visit: Payer: Self-pay | Admitting: Family Medicine

## 2023-07-17 NOTE — Telephone Encounter (Signed)
  Chief Complaint: possible UTI Symptoms: urinary frequency, urgency, burning with urination Frequency: x 3 days Pertinent Negatives: Patient denies blood in urine, back pain, nausea, vomiting, foul odor or cloudiness to urine, fever. Disposition: [] ED /[] Urgent Care (no appt availability in office) / [x] Appointment(In office/virtual)/ []  [place] [facility]/ [] Home Care/ [] Refused Recommended Disposition /[] [place] [facility]/ []  Follow-up with PCP Additional Notes: Patient refused acute visit today available with [facility] and states she would prefer to be seen today at her PCP office. Patient aware no appointments available today, patient scheduled for tomorrow morning with Dr Lodema Hong. Advised patient to call back for any  new or worsening symptoms.  Copied from CRM [id number]. Topic: Clinical - Red Word Triage >> Jul 17, 2023 10:39 AM Fuller Mandril wrote: Red Word that prompted transfer to Nurse Triage: Burning stinging when urinating Reason for Disposition  Age > 50 years  Answer Assessment - Initial Assessment Questions 1. SEVERITY: "How bad is the pain?"  (e.g., Scale 1-10; mild, moderate, or severe)   - MILD (1-3): complains slightly about urination hurting   - MODERATE (4-7): interferes with normal activities     - SEVERE (8-10): excruciating, unwilling or unable to urinate because of the pain      8/10.  2. FREQUENCY: "How many times have you had painful urination today?"      About 3-4 times.  3. PATTERN: "Is pain present every time you urinate or just sometimes?"      All the time.  4. ONSET: "When did the painful urination start?"      About 3 days ago.  5. FEVER: "Do you have a fever?" If Yes, ask: "What is your temperature, how was it measured, and when did it start?"     Denies.  6. PAST UTI: "Have you had a urine infection before?" If Yes, ask: "When was the last time?" and "What happened that time?"      Yes, "probably a while back". She states  she went to her PCP and got on antibiotics.  7. CAUSE: "What do you think is causing the painful urination?"  (e.g., UTI, scratch, Herpes sore)     UTI.  8. OTHER SYMPTOMS: "Do you have any other symptoms?" (e.g., blood in urine, flank pain, genital sores, urgency, vaginal discharge)     Burning with urination, urinary frequency, urgency.  Protocols used: Urination Pain - Female-A-AH

## 2023-07-18 ENCOUNTER — Ambulatory Visit: Payer: Self-pay | Admitting: Family Medicine

## 2023-07-18 ENCOUNTER — Ambulatory Visit: Payer: BC Managed Care – PPO | Admitting: Family Medicine

## 2023-07-18 ENCOUNTER — Ambulatory Visit (INDEPENDENT_AMBULATORY_CARE_PROVIDER_SITE_OTHER): Payer: Self-pay | Admitting: Family Medicine

## 2023-07-18 ENCOUNTER — Encounter: Payer: Self-pay | Admitting: Family Medicine

## 2023-07-18 VITALS — BP 108/70 | HR 67 | Ht 63.0 in | Wt 138.1 lb

## 2023-07-18 DIAGNOSIS — M79674 Pain in right toe(s): Secondary | ICD-10-CM | POA: Insufficient documentation

## 2023-07-18 DIAGNOSIS — B351 Tinea unguium: Secondary | ICD-10-CM | POA: Insufficient documentation

## 2023-07-18 DIAGNOSIS — R399 Unspecified symptoms and signs involving the genitourinary system: Secondary | ICD-10-CM | POA: Insufficient documentation

## 2023-07-18 MED ORDER — DOXYCYCLINE HYCLATE 100 MG PO TABS: 100.0000 mg | ORAL_TABLET | Freq: Two times a day (BID) | ORAL | 0 refills | Status: DC

## 2023-07-18 MED ORDER — TERBINAFINE HCL 250 MG PO TABS: 250.0000 mg | ORAL_TABLET | Freq: Every day | ORAL | 0 refills | Status: DC

## 2023-07-18 NOTE — Progress Notes (Signed)
   Courtney Rogers     MRN: [id number]      DOB: 1950/11/10  Chief Complaint  Patient presents with   Acute Visit    burning with urination, urgency and frequency x 3 days; fingernail fungus since June to both hands, three toes on left foot that are causing pain.     HPI Courtney Rogers is here 3 day h/o burning and frequency with urination 3 week h/o discomfort of toes 3, 4 5 on right foot as inverts when she walks C/o funnagal neail infection in fingernils x months, with abn sensation at fingertips  ROS Denies recent fever or chills. Denies sinus pressure, nasal congestion, ear pain or sore throat. Denies chest congestion, productive cough or wheezing. Denies chest pains, palpitations and leg swelling Denies abdominal pain, nausea, vomiting,diarrhea or constipation.   .  PE  BP 108/70   Pulse 67   Ht 5\' 3"  (1.6 m)   Wt 138 lb 1.9 oz (62.7 kg)   SpO2 95%   BMI 24.47 kg/m   Patient alert and oriented and in no cardiopulmonary distress.  HEENT: No facial asymmetry, EOMI,     Neck supple .  Chest: Clear to auscultation bilaterally.  CVS: S1, S2 no murmurs, no S3.Regular rate.  ABD: Soft non tender. Mild suprapubic tenderness  no renal angle tenderness  Ext: No edema  MS: Adequate ROM spine, shoulders, hips and knees.  Skin: Intact, no ulcerations or rash noted.mildly thickened and discolored fingernails  Psych: Good eye contact, normal affect. Memory intact not anxious or depressed appearing.  CNS: CN 2-12 intact, power,  normal throughout.no focal deficits noted.   Assessment & Plan  Urinary tract infection symptoms CCUJA is abn sent for c/S and doxy x 3 days is prescribed, will follow up  Fungal nail infection Mild thickening of some fingernails , with crumbling, 2 weeks only of terbinafine  250 mg daily prescribed, f/u with PCP  Toe pain, right No abnormality on exm f/U with PCP, may need orthotics for shoe

## 2023-07-18 NOTE — Assessment & Plan Note (Signed)
 Mild thickening of some fingernails , with crumbling, 2 weeks only of terbinafine  250 mg daily prescribed, f/u with PCP

## 2023-07-18 NOTE — Telephone Encounter (Signed)
 Chief Complaint: Medication Question Symptoms: Denies Pertinent Negatives: Patient denies symptoms Disposition: [] ED /[] Urgent Care (no appt availability in office) / [] Appointment(In office/virtual)/ []  [place] [facility]/ [] Home Care/ [] Refused Recommended Disposition /[]  Mobile Bus/ [x]  Follow-up with PCP Additional Notes: Pt was prescribed doxycycline 100 mg- Take 1 tablet (100 mg total) by mouth two times daily. Pt was prescribed this medication by Dr. Syliva Overman. Pt states she accidentally took two tablets at one time instead of taking one tablet two times a day. Pt denies any symptoms. Pt told her provider will be notified. This RN educated pt on home care, new-worsening symptoms, when to call back/seek emergent care. Pt verbalized understanding and agrees to plan.    Copied from CRM [id number]. Topic: Clinical - Red Word Triage >> Jul 18, 2023  2:40 PM Fredrica W wrote: Red Word that prompted transfer to Nurse Triage: Supposed to take medication twice per day but took two at one time and is unsure if she took took much or how to proceed. doxycycline (VIBRA-TABS) 100 MG tablet Reason for Disposition  [1] DOUBLE DOSE (an extra dose or lesser amount) of antibiotic drug AND [2] NO symptoms  Answer Assessment - Initial Assessment Questions 1. NAME of MEDICINE: "What medicine(s) are you calling about?"     Doxycycline 100 mg 2. QUESTION: "What is your question?" (e.g., double dose of medicine, side effect)     Took two tablets at one time instead of one tablet two times a day 3. PRESCRIBER: "Who prescribed the medicine?" Reason: if prescribed by specialist, call should be referred to that group.    Syliva Overman, MD 4. SYMPTOMS: "Do you have any symptoms?" If Yes, ask: "What symptoms are you having?"  "How bad are the symptoms (e.g., mild, moderate, severe)     Denies symptoms  Protocols used: Medication Question Call-A-AH

## 2023-07-18 NOTE — Patient Instructions (Addendum)
 F/u with PCP in March as before   Please schedule mammogram already ordered and is overdue at checkout  2 weeks of antifungal tablet, terbinafine one daily is prescribed for fingernails  Please further discuss concern with toes on right foot with your PCP  Urine is being tested  and the  urine seems infected, doxycycline is prescribed for 3 days only, you will be contacted with culture report if abnormal  All the best!  [facility] you feelbetter  Thanks for choosing [facility], we consider it a privelige to serve you.

## 2023-07-18 NOTE — Assessment & Plan Note (Signed)
 No abnormality on exm f/U with PCP, may need orthotics for shoe

## 2023-07-18 NOTE — Assessment & Plan Note (Signed)
 CCUJA is abn sent for c/S and doxy x 3 days is prescribed, will follow up

## 2023-07-19 ENCOUNTER — Ambulatory Visit: Payer: Self-pay | Admitting: Family Medicine

## 2023-07-19 LAB — URINALYSIS
Bilirubin, UA: NEGATIVE
Glucose, UA: NEGATIVE
Ketones, UA: NEGATIVE
Nitrite, UA: NEGATIVE
RBC, UA: NEGATIVE
Specific Gravity, UA: 1.02 (ref 1.005–1.030)
Urobilinogen, Ur: 0.2 mg/dL (ref 0.2–1.0)
pH, UA: 8.5 — ABNORMAL HIGH (ref 5.0–7.5)

## 2023-07-19 NOTE — Telephone Encounter (Signed)
 Copied from CRM [id number]. Topic: Clinical - Medication Question >> Jul 19, 2023  1:48 PM Whitney O wrote: Reason for CRM: patient is calling cause she want to know should she take a medication. No side effects.  doxycycline (VIBRA-TABS) 100 MG tablet and the  terbinafine (LAMISIL) 250 MG tablet

## 2023-07-19 NOTE — Telephone Encounter (Signed)
 Pt calling to follow up on medication question. This RN advised pt provider still to review message and provide recommendations.

## 2023-07-19 NOTE — Telephone Encounter (Signed)
 1 tablet by mouth 2 times daily

## 2023-07-19 NOTE — Telephone Encounter (Signed)
 Chief Complaint: Medication Reaction Symptoms: Itching around neck, chest last night; now a stinging sensation in chest, neck Frequency: Ongoing Pertinent Negatives: Patient denies rash, swelling, hives, dizziness, itchiness Disposition: [] ED /[] Urgent Care (no appt availability in office) / [] Appointment(In office/virtual)/ []  [place] [facility]/ [] Home Care/ [] Refused Recommended Disposition /[] [place] Mobile [facility]/ [x]  Follow-up with PCP Additional Notes: Pt had called yesterday concerned that she took two tablets of doxycycline at one time instead of one tablet twice a day. Pt was told to call back if she noticed any symptoms. Pt called this morning as she states she had itching of the neck and chest last night after taking terbinafine. Pt states the itching has gone away this morning but there is a stinging sensation on neck and chest from where she itched the area. Pt concerned she is having a reaction to either the doxycycline or terbinafine. Pt wants a call back from office today letting her know whether or not she can continue taking the doxycyline and/or terbinafine due to itching she experienced last night. This RN educated pt on home care, new-worsening symptoms, when to call back/seek emergent care. Pt verbalized understanding and agrees to plan.    Took 2 at same tim,e Copied from CRM [id number]. Topic: Clinical - Red Word Triage >> Jul 19, 2023 10:05 AM Dennison Nancy wrote: Red Word that prompted transfer to Nurse Triage:  started yesterday with side effects  want to know if overdose on the medications and having side effects have itching and stingy around the neck area   took  of the doxycycline (VIBRA-TABS) 100 MG tablet and the  terbinafine (LAMISIL) 250 MG tablet Reason for Disposition  [1] DOUBLE DOSE (an extra dose or lesser amount) of antibiotic drug AND [2] NO symptoms  Answer Assessment - Initial Assessment Questions 1. NAME of MEDICINE: "What medicine(s) are you calling  about?"     Doxycycline 100 mg 2. QUESTION: "What is your question?" (e.g., double dose of medicine, side effect)     Took two tablets of doxycyline at one time instead of one tablet twice a day 3. PRESCRIBER: "Who prescribed the medicine?" Reason: if prescribed by specialist, call should be referred to that group.     Dr. Syliva Overman 4. SYMPTOMS: "Do you have any symptoms?" If Yes, ask: "What symptoms are you having?"  "How bad are the symptoms (e.g., mild, moderate, severe)     Itching around neck and chest, feels stingy  Protocols used: Medication Question Call-A-AH

## 2023-07-20 ENCOUNTER — Ambulatory Visit: Payer: Self-pay | Admitting: Family Medicine

## 2023-07-20 ENCOUNTER — Inpatient Hospital Stay: Payer: Medicare Other | Admitting: Hematology

## 2023-07-20 ENCOUNTER — Inpatient Hospital Stay: Payer: Medicare Other

## 2023-07-20 VITALS — BP 129/79 | HR 65 | Temp 98.2°F | Resp 16 | Wt 141.9 lb

## 2023-07-20 DIAGNOSIS — C9000 Multiple myeloma not having achieved remission: Secondary | ICD-10-CM | POA: Diagnosis not present

## 2023-07-20 DIAGNOSIS — Z79899 Other long term (current) drug therapy: Secondary | ICD-10-CM | POA: Diagnosis not present

## 2023-07-20 DIAGNOSIS — Z86711 Personal history of pulmonary embolism: Secondary | ICD-10-CM | POA: Diagnosis not present

## 2023-07-20 DIAGNOSIS — Z5112 Encounter for antineoplastic immunotherapy: Secondary | ICD-10-CM | POA: Diagnosis present

## 2023-07-20 DIAGNOSIS — G629 Polyneuropathy, unspecified: Secondary | ICD-10-CM | POA: Diagnosis not present

## 2023-07-20 LAB — CBC WITH DIFFERENTIAL/PLATELET
Abs Immature Granulocytes: 0.01 10*3/uL (ref 0.00–0.07)
Basophils Absolute: 0 10*3/uL (ref 0.0–0.1)
Basophils Relative: 1 %
Eosinophils Absolute: 0.1 10*3/uL (ref 0.0–0.5)
Eosinophils Relative: 2 %
HCT: 35.8 % — ABNORMAL LOW (ref 36.0–46.0)
Hemoglobin: 12 g/dL (ref 12.0–15.0)
Immature Granulocytes: 0 %
Lymphocytes Relative: 28 %
Lymphs Abs: 1.1 10*3/uL (ref 0.7–4.0)
MCH: 35.2 pg — ABNORMAL HIGH (ref 26.0–34.0)
MCHC: 33.5 g/dL (ref 30.0–36.0)
MCV: 105 fL — ABNORMAL HIGH (ref 80.0–100.0)
Monocytes Absolute: 0.5 10*3/uL (ref 0.1–1.0)
Monocytes Relative: 13 %
Neutro Abs: 2.1 10*3/uL (ref 1.7–7.7)
Neutrophils Relative %: 56 %
Platelets: 126 10*3/uL — ABNORMAL LOW (ref 150–400)
RBC: 3.41 MIL/uL — ABNORMAL LOW (ref 3.87–5.11)
RDW: 13.7 % (ref 11.5–15.5)
WBC: 3.8 10*3/uL — ABNORMAL LOW (ref 4.0–10.5)
nRBC: 0 % (ref 0.0–0.2)

## 2023-07-20 LAB — COMPREHENSIVE METABOLIC PANEL
ALT: 20 U/L (ref 0–44)
AST: 23 U/L (ref 15–41)
Albumin: 3.8 g/dL (ref 3.5–5.0)
Alkaline Phosphatase: 40 U/L (ref 38–126)
Anion gap: 10 (ref 5–15)
BUN: 16 mg/dL (ref 8–23)
CO2: 26 mmol/L (ref 22–32)
Calcium: 9.4 mg/dL (ref 8.9–10.3)
Chloride: 98 mmol/L (ref 98–111)
Creatinine, Ser: 0.82 mg/dL (ref 0.44–1.00)
GFR, Estimated: >60 mL/min (ref >60)
Glucose, Bld: 91 mg/dL (ref 70–99)
Potassium: 3.6 mmol/L (ref 3.5–5.1)
Sodium: 134 mmol/L — ABNORMAL LOW (ref 135–145)
Total Bilirubin: 0.7 mg/dL (ref 0.0–1.2)
Total Protein: 6.9 g/dL (ref 6.5–8.1)

## 2023-07-20 MED ORDER — PROCHLORPERAZINE MALEATE 10 MG PO TABS
10.0000 mg | ORAL_TABLET | Freq: Four times a day (QID) | ORAL | Status: DC | PRN
  Administered 2023-07-20: 10 mg via ORAL
  Filled 2023-07-20: qty 1

## 2023-07-20 MED ORDER — DEXAMETHASONE 4 MG PO TABS
20.0000 mg | ORAL_TABLET | Freq: Once | ORAL | Status: AC
  Administered 2023-07-20: 20 mg via ORAL
  Filled 2023-07-20: qty 5

## 2023-07-20 MED ORDER — BORTEZOMIB CHEMO SQ INJECTION 3.5 MG (2.5MG/ML)
0.7000 mg/m2 | Freq: Once | INTRAMUSCULAR | Status: AC
  Administered 2023-07-20: 1.25 mg via SUBCUTANEOUS
  Filled 2023-07-20: qty 0.5

## 2023-07-20 NOTE — Patient Instructions (Signed)
 [facility] [place] - A DEPT OF [facility]  Discharge Instructions: Thank you for choosing [facility] to provide your oncology and hematology care.  If you have a lab appointment with the Cancer Center - please note that after April 8th, 2024, all labs will be drawn in the cancer center.  You do not have to check in or register with the main entrance as you have in the past but will complete your check-in in the cancer center.  Wear comfortable clothing and clothing appropriate for easy access to any Portacath or PICC line.   We strive to give you quality time with your provider. You may need to reschedule your appointment if you arrive late (15 or more minutes).  Arriving late affects you and other patients whose appointments are after yours.  Also, if you miss three or more appointments without notifying the office, you may be dismissed from the clinic at the provider's discretion.      For prescription refill requests, have your pharmacy contact our office and allow 72 hours for refills to be completed.    Today you received the following chemotherapy and/or immunotherapy agents Velcade   To help prevent nausea and vomiting after your treatment, we encourage you to take your nausea medication as directed.  Bortezomib Injection What is this medication? BORTEZOMIB (bor TEZ oh mib) treats lymphoma. It may also be used to treat multiple myeloma, a type of bone marrow cancer. It works by blocking a protein that causes cancer cells to grow and multiply. This helps to slow or stop the spread of cancer cells. This medicine may be used for other purposes; ask your health care provider or pharmacist if you have questions. COMMON BRAND NAME(S): BORUZU, Velcade What should I tell my care team before I take this medication? They need to know if you have any of these conditions: Dehydration Diabetes Heart disease Liver disease Tingling of the fingers or toes or  other nerve disorder An unusual or allergic reaction to bortezomib, other medications, foods, dyes, or preservatives If you or your partner are pregnant or trying to get pregnant Breastfeeding How should I use this medication? This medication is injected into a vein or under the skin. It is given by your care team in a hospital or clinic setting. Talk to your care team about the use of this medication in children. Special care may be needed. Overdosage: If you think you have taken too much of this medicine contact a poison control center or emergency room at once. NOTE: This medicine is only for you. Do not share this medicine with others. What if I miss a dose? Keep appointments for follow-up doses. It is important not to miss your dose. Call your care team if you are unable to keep an appointment. What may interact with this medication? Ketoconazole Rifampin This list may not describe all possible interactions. Give your health care provider a list of all the medicines, herbs, non-prescription drugs, or dietary supplements you use. Also tell them if you smoke, drink alcohol, or use illegal drugs. Some items may interact with your medicine. What should I watch for while using this medication? Your condition will be monitored carefully while you are receiving this medication. You may need blood work while taking this medication. This medication may affect your coordination, reaction time, or judgment. Do not drive or operate machinery until you know how this medication affects you. Sit up or stand slowly to  reduce the risk of dizzy or fainting spells. Drinking alcohol with this medication can increase the risk of these side effects. This medication may increase your risk of getting an infection. Call your care team for advice if you get a fever, chills, sore throat, or other symptoms of a cold or flu. Do not treat yourself. Try to avoid being around people who are sick. Check with your care team  if you have severe diarrhea, nausea, and vomiting, or if you sweat a lot. The loss of too much body fluid may make it dangerous for you to take this medication. Talk to your care team if you may be pregnant. Serious birth defects can occur if you take this medication during pregnancy and for 7 months after the last dose. You will need a negative pregnancy test before starting this medication. Contraception is recommended while taking this medication and for 7 months after the last dose. Your care team can help you find the option that works for you. If your partner can get pregnant, use a condom during sex while taking this medication and for 4 months after the last dose. Do not breastfeed while taking this medication and for 2 months after the last dose. This medication may cause infertility. Talk to your care team if you are concerned about your fertility. What side effects may I notice from receiving this medication? Side effects that you should report to your care team as soon as possible: Allergic reactions--skin rash, itching, hives, swelling of the face, lips, tongue, or throat Bleeding--bloody or black, tar-like stools, vomiting blood or brown material that looks like coffee grounds, red or dark brown urine, small red or purple spots on skin, unusual bruising or bleeding Bleeding in the brain--severe headache, stiff neck, confusion, dizziness, change in vision, numbness or weakness of the face, arm, or leg, trouble speaking, trouble walking, vomiting Bowel blockage--stomach cramping, unable to have a bowel movement or pass gas, loss of appetite, vomiting Heart failure--shortness of breath, swelling of the ankles, feet, or hands, sudden weight gain, unusual weakness or fatigue Infection--fever, chills, cough, sore throat, wounds that don't heal, pain or trouble when passing urine, general feeling of discomfort or being unwell Liver injury--right upper belly pain, loss of appetite, nausea,  light-colored stool, dark yellow or brown urine, yellowing skin or eyes, unusual weakness or fatigue Low blood pressure--dizziness, feeling faint or lightheaded, blurry vision Lung injury--shortness of breath or trouble breathing, cough, spitting up blood, chest pain, fever Pain, tingling, or numbness in the hands or feet Severe or prolonged diarrhea Stomach pain, bloody diarrhea, pale skin, unusual weakness or fatigue, decrease in the amount of urine, which may be signs of hemolytic uremic syndrome Sudden and severe headache, confusion, change in vision, seizures, which may be signs of posterior reversible encephalopathy syndrome (PRES) TTP--purple spots on the skin or inside the mouth, pale skin, yellowing skin or eyes, unusual weakness or fatigue, fever, fast or irregular heartbeat, confusion, change in vision, trouble speaking, trouble walking Tumor lysis syndrome (TLS)--nausea, vomiting, diarrhea, decrease in the amount of urine, dark urine, unusual weakness or fatigue, confusion, muscle pain or cramps, fast or irregular heartbeat, joint pain Side effects that usually do not require medical attention (report to your care team if they continue or are bothersome): Constipation Diarrhea Fatigue Loss of appetite Nausea This list may not describe all possible side effects. Call your doctor for medical advice about side effects. You may report side effects to FDA at 1-800-FDA-1088. Where should I keep  my medication? This medication is given in a hospital or clinic. It will not be stored at home. NOTE: This sheet is a summary. It may not cover all possible information. If you have questions about this medicine, talk to your doctor, pharmacist, or health care provider.  2024 Elsevier/Gold Standard (2021-10-12 00:00:00)  BELOW ARE SYMPTOMS THAT SHOULD BE REPORTED IMMEDIATELY: *FEVER GREATER THAN 100.4 F (38 C) OR HIGHER *CHILLS OR SWEATING *NAUSEA AND VOMITING THAT IS NOT CONTROLLED WITH YOUR  NAUSEA MEDICATION *UNUSUAL SHORTNESS OF BREATH *UNUSUAL BRUISING OR BLEEDING *URINARY PROBLEMS (pain or burning when urinating, or frequent urination) *BOWEL PROBLEMS (unusual diarrhea, constipation, pain near the anus) TENDERNESS IN MOUTH AND THROAT WITH OR WITHOUT PRESENCE OF ULCERS (sore throat, sores in mouth, or a toothache) UNUSUAL RASH, SWELLING OR PAIN  UNUSUAL VAGINAL DISCHARGE OR ITCHING   Items with * indicate a potential emergency and should be followed up as soon as possible or go to the Emergency Department if any problems should occur.  Please show the CHEMOTHERAPY ALERT CARD or IMMUNOTHERAPY ALERT CARD at check-in to the Emergency Department and triage nurse.  Should you have questions after your visit or need to cancel or reschedule your appointment, please contact [facility] [place] - A DEPT OF [facility] [phone]  and follow the prompts.  Office hours are 8:00 a.m. to 4:30 p.m. Monday - Friday. Please note that voicemails left after 4:00 p.m. may not be returned until the following business day.  We are closed weekends and major holidays. You have access to a nurse at all times for urgent questions. Please call the main number to the clinic [phone] and follow the prompts.  For any non-urgent questions, you may also contact your provider using MyChart. We now offer e-Visits for anyone 40 and older to request care online for non-urgent symptoms. For details visit [email or web address].   Also download the MyChart app! Go to the app store, search "MyChart", open the app, select [place], and log in with your MyChart username and password.

## 2023-07-20 NOTE — Progress Notes (Signed)
 Patient presents today for Velcade infusion. Patient is in satisfactory condition with no new complaints voiced.  Vital signs are stable.  Labs reviewed and all labs are within treatment parameters.  We will proceed with treatment per MD orders.    Treatment given today per MD orders. Tolerated infusion without adverse affects. Vital signs stable. No complaints at this time. Discharged from clinic via wheelchair in stable condition. Alert and oriented x 3. F/U with [facility] as scheduled.

## 2023-07-20 NOTE — Telephone Encounter (Signed)
  Info only call: Pt following up a third time to find out if PCP wants her to continue taking the doxycycline due to chin itching. RN called [facility] and spoke to [facility]. Nurse Herbert Seta verified with Denny Levy to continue medication. RN explained to pt and pt verbalized understanding.          Copied from CRM [id number]. Topic: Clinical - Red Word Triage >> Jul 20, 2023  8:29 AM Clayton Bibles wrote: Red Word that prompted transfer to Nurse Triage: She is having itching under her chin after she took doxycycline (VIBRA-TABS) 100 MG tablet. She wants to know if she should take today. Reason for Disposition  Health Information question, no triage required and triager able to answer question  Answer Assessment - Initial Assessment Questions 1. REASON FOR CALL or QUESTION: "What is your reason for calling today?" or "How can I best help you?" or "What question do you have that I can help answer?"     Pt calling back to hear on provider recommendation.  Protocols used: Information Only Call - No Triage-A-AH

## 2023-07-20 NOTE — Telephone Encounter (Signed)
 Patient advised.

## 2023-07-20 NOTE — Telephone Encounter (Signed)
  Chief Complaint: UTI (on antibiotic less than 48 hours) Symptoms: urinary frequency, burning/stinging sensation, abdominal cramps Frequency: symptoms the same since OV on 07/18/23 Pertinent Negatives: Patient denies fever, blood in urine, vaginal discharge, flank or back pain, only a few drops. Disposition: [] ED /[] Urgent Care (no appt availability in office) / [] Appointment(In office/virtual)/ []  [place] [facility]/ [] Home Care/ [] Refused Recommended Disposition /[] [place] Mobile Bus/ [x]  Follow-up with PCP Additional Notes: Patient states she started her doxycyline on Tuesday night and accidentally took a double dose(2 pills) that night. She has been on the antibiotic for under 48 hours and states she feels about the same. Patient is concerned due to she only has 1 pill left to take tonight and does not feel improved. Patient asking if she should continue with the doxycycline and lamisil as she feels they are not working. Advised patient to continue medications per prescribers orders until she receives further instructions from the clinic. Patient verbalized understanding to call back for new or worsening symptoms. Please advise patient.  Copied from CRM [id number]. Topic: Clinical - Red Word Triage >> Jul 20, 2023  1:36 PM Geroge Baseman wrote: Red Word that prompted transfer to Nurse Triage: Patient has been taking doxycycline for 3 days for infection not feeling any better, UTI. Reason for Disposition  [1] Taking antibiotic < 72 hours (3 days) for UTI AND [2] painful urination or frequency is SAME (unchanged, not better)  Answer Assessment - Initial Assessment Questions 1. MAIN SYMPTOM: "What is the main symptom you are concerned about?" (e.g., painful urination, urine frequency)     Burning/stinging "even when I'm not urinating", abdominal cramp.  2. BETTER-SAME-WORSE: "Are you getting better, staying the same, or getting worse compared to how you felt at your last visit to the doctor (most  recent medical visit)?"     About the same.  3. PAIN: "How bad is the pain?"  (e.g., Scale 1-10; mild, moderate, or severe)   - MILD (1-3): complains slightly about urination hurting   - MODERATE (4-7): interferes with normal activities     - SEVERE (8-10): excruciating, unwilling or unable to urinate because of the pain      8/10, no OTC pain medications taken.  4. FEVER: "Do you have a fever?" If Yes, ask: "What is it, how was it measured, and when did it start?"     Denies. 5. OTHER SYMPTOMS: "Do you have any other symptoms?" (e.g., blood in the urine, flank pain, vaginal discharge)     Urinary frequency.  6. DIAGNOSIS: "When was the UTI diagnosed?" "By whom?" "Was it a kidney infection, bladder infection or both?"     Dr Lodema Hong, on 07/18/23. Bladder symptoms.  7. ANTIBIOTIC: "What antibiotic(s) are you taking?" "How many times per day?"     Doxycycline twice daily.  8. ANTIBIOTIC - START DATE: "When did you start taking the antibiotic?"     3rd day on medication.  Protocols used: Urinary Tract Infection on Antibiotic Follow-up Call - [facility]

## 2023-07-21 ENCOUNTER — Ambulatory Visit (INDEPENDENT_AMBULATORY_CARE_PROVIDER_SITE_OTHER): Payer: Self-pay | Admitting: Family Medicine

## 2023-07-21 ENCOUNTER — Encounter: Payer: Self-pay | Admitting: Family Medicine

## 2023-07-21 ENCOUNTER — Other Ambulatory Visit: Payer: Self-pay | Admitting: Family Medicine

## 2023-07-21 VITALS — BP 129/76 | HR 73 | Temp 98.2°F | Resp 16 | Ht 64.0 in | Wt 139.0 lb

## 2023-07-21 DIAGNOSIS — L299 Pruritus, unspecified: Secondary | ICD-10-CM | POA: Diagnosis not present

## 2023-07-21 DIAGNOSIS — R300 Dysuria: Secondary | ICD-10-CM

## 2023-07-21 MED ORDER — SULFAMETHOXAZOLE-TRIMETHOPRIM 800-160 MG PO TABS: 1.0000 | ORAL_TABLET | Freq: Two times a day (BID) | ORAL | 0 refills | Status: DC

## 2023-07-21 NOTE — Patient Instructions (Signed)
 F/U with PCP as before  New antibiotic is prescribed for 3 days only, Septra  By next week Monday or Tuesday a report will be available regarding urine culture, and you will be contacted  IF you have an infection we will know clearly if the antbiotic you have received will cure it  Use benadry for itch on left neck  If no infection, because of persistent bladder/ urinary symptoms I would recommend Urology evaluation and will with your PCP about this

## 2023-07-23 ENCOUNTER — Encounter: Payer: Self-pay | Admitting: Family Medicine

## 2023-07-23 DIAGNOSIS — R300 Dysuria: Secondary | ICD-10-CM | POA: Insufficient documentation

## 2023-07-23 DIAGNOSIS — L299 Pruritus, unspecified: Secondary | ICD-10-CM | POA: Insufficient documentation

## 2023-07-23 NOTE — Assessment & Plan Note (Signed)
 Urine c/s pending, 3 days of septra is prescribed, encouraged to push water and void often

## 2023-07-23 NOTE — Progress Notes (Signed)
   Courtney Rogers     MRN: [id number]      DOB: 1950-08-13  Chief Complaint  Patient presents with   Urinary Tract Infection    Pain lower abdomen, dysuria and frequency. Finished all 3 days of antibiotic (doxycycline)    HPI Courtney Rogers is here reporting no symptom relief on doxycycline which she has completed for UTI. No fever , chills or flank pain , no nausea or vomiting Urine culture still pending C/o itchy area on left neck, no visible rash   ROS See HPI   PE  BP 129/76   Pulse 73   Temp 98.2 F (36.8 C) (Oral)   Resp 16   Ht 5\' 4"  (1.626 m)   Wt 139 lb (63 kg)   SpO2 98%   BMI 23.86 kg/m   Patient alert and oriented and in no cardiopulmonary distress.  HEENT: No facial asymmetry, EOMI,     Neck supple .  Chest: Clear to auscultation bilaterally.  CVS: S1, S2 no murmurs, no S3.Regular rate.  ABD: Soft non tender. No renal angle or suprapubic tenderness  Ext: No edema    Skin: Intact, no ulcerations or rash noted.  Psych: Good eye contact, normal affect. Memory intact not anxious or depressed appearing.  CNS: CN 2-12 intact, power,  normal throughout.no focal deficits noted.   Assessment & Plan  Dysuria Urine c/s pending, 3 days of septra is prescribed, encouraged to push water and void often  Itching Reports itching on left neck, using benadryl with success, no visible rash, she is to continue same

## 2023-07-23 NOTE — Assessment & Plan Note (Signed)
 Reports itching on left neck, using benadryl with success, no visible rash, she is to continue same

## 2023-07-24 LAB — URINE CULTURE

## 2023-07-25 NOTE — Progress Notes (Signed)
 Sensitivity lab added

## 2023-07-25 NOTE — Telephone Encounter (Unsigned)
 Copied from CRM [id number]. Topic: General - Other >> Jul 24, 2023  3:53 PM Ebonie J wrote: Reason for CRM: Pt wanted her provider to know that the itching on her neck had eased up with the benadryl, She stated the UTI also eased up but feel like 3 days on her prescribed meds were not enough. She would like to receive a call to discuss further. Best call back [phone]

## 2023-07-27 ENCOUNTER — Inpatient Hospital Stay: Payer: Medicare Other

## 2023-07-31 ENCOUNTER — Ambulatory Visit (HOSPITAL_COMMUNITY)
Admission: RE | Admit: 2023-07-31 | Discharge: 2023-07-31 | Disposition: A | Discharge status: Home / Self Care | Payer: Medicare Other | Source: Ambulatory Visit | Attending: Family Medicine | Admitting: Family Medicine

## 2023-07-31 ENCOUNTER — Ambulatory Visit: Payer: Medicare Other | Admitting: Family Medicine

## 2023-07-31 DIAGNOSIS — Z1231 Encounter for screening mammogram for malignant neoplasm of breast: Secondary | ICD-10-CM | POA: Insufficient documentation

## 2023-07-31 NOTE — Telephone Encounter (Signed)
 Patient calling about lab results for urine.

## 2023-07-31 NOTE — Telephone Encounter (Signed)
Awaiting provider review.

## 2023-07-31 NOTE — Telephone Encounter (Unsigned)
 Copied from CRM [id number]. Topic: Clinical - Lab/Test Results >> Jul 31, 2023  1:14 PM Lars Mage H wrote: Reason for CRM: Patient had a urine test a few weeks ago and has not received the results yet - Please call patient - they would like to know whether they actually had a UTI or not.

## 2023-08-01 ENCOUNTER — Ambulatory Visit: Payer: Self-pay | Admitting: Family Medicine

## 2023-08-01 ENCOUNTER — Ambulatory Visit (INDEPENDENT_AMBULATORY_CARE_PROVIDER_SITE_OTHER): Admitting: Family Medicine

## 2023-08-01 ENCOUNTER — Encounter: Payer: Self-pay | Admitting: Family Medicine

## 2023-08-01 VITALS — BP 107/63 | HR 74 | Ht 64.0 in | Wt 139.0 lb

## 2023-08-01 DIAGNOSIS — R399 Unspecified symptoms and signs involving the genitourinary system: Secondary | ICD-10-CM

## 2023-08-01 DIAGNOSIS — R32 Unspecified urinary incontinence: Secondary | ICD-10-CM

## 2023-08-01 DIAGNOSIS — Z113 Encounter for screening for infections with a predominantly sexual mode of transmission: Secondary | ICD-10-CM | POA: Diagnosis not present

## 2023-08-01 MED ORDER — FLUCONAZOLE 150 MG PO TABS: 150.0000 mg | ORAL_TABLET | Freq: Once | ORAL | 0 refills | Status: AC

## 2023-08-01 NOTE — Patient Instructions (Addendum)
 F/U with PCP, in 2 to 3 weeks if symptoms persist  Urine for CS only    HSV2 today   Fluconazole 1 tablets prescribed in case some of your symptoms are due to yeast infection from repeated antibiotic courses  Thanks for choosing [facility], we consider it a privelige to serve you.

## 2023-08-01 NOTE — Telephone Encounter (Signed)
 Patient is very upset about the uti results that she still has not received  and the problem still exist and dr simpson gave me 2 prescriptions . But it wasn't enough to clear the infection . Patient is saying she just want to get on with life . She is very frustrated and I did relay to her that the results are still pending . Please give patient a call please [phone]

## 2023-08-01 NOTE — Telephone Encounter (Signed)
  Chief Complaint: urinary burning, right flank pain . Recent treatment for UTI and sx continue Symptoms: right side pain, urinary burning with every urination. Took bactrim course 07/21/23 and sx not better.  Frequency: 07/18/23 Pertinent Negatives: Patient denies fever no blood in urine  Disposition: [] ED /[] Urgent Care (no appt availability in office) / [x] Appointment(In office/virtual)/ []  [facility]/ [] Home Care/ [] Refused Recommended Disposition /[] [facility]/ []  Follow-up with PCP Additional Notes:   Patient requesting lab results from 07/18/23. Reviewed MD note to add sensitivity to urine check. No documentation noted. Patient continues with sx and now flank pain. Scheduled appt today for re evaluation. Please advise       Copied from CRM [id number]. Topic: Clinical - Red Word Triage >> Aug 01, 2023 12:20 PM Whitney O wrote: [facility] that prompted transfer to Nurse Triage: burning when urinating . Patient is still waiting for results but they are still pending tried calling cal no response patient is still having symptoms and the medication that was prescribed was only for 3 days and it hasn't cleared the infection Reason for Disposition  Side (flank) or lower back pain present  Answer Assessment - Initial Assessment Questions 1. SEVERITY: "How bad is the pain?"  (e.g., Scale 1-10; mild, moderate, or severe)   - MILD (1-3): complains slightly about urination hurting   - MODERATE (4-7): interferes with normal activities     - SEVERE (8-10): excruciating, unwilling or unable to urinate because of the pain      Burning pain with urination  2. FREQUENCY: "How many times have you had painful urination today?"      Every urination 3. PATTERN: "Is pain present every time you urinate or just sometimes?"      Yes  4. ONSET: "When did the painful urination start?"      07/18/23 5. FEVER: "Do you have a fever?" If Yes, ask: "What is your temperature, how was it measured,  and when did it start?"     na 6. PAST UTI: "Have you had a urine infection before?" If Yes, ask: "When was the last time?" and "What happened that time?"      Yes  7. CAUSE: "What do you think is causing the painful urination?"  (e.g., UTI, scratch, Herpes sore)     UTI  8. OTHER SYMPTOMS: "Do you have any other symptoms?" (e.g., blood in urine, flank pain, genital sores, urgency, vaginal discharge)     Urinary burning, right side pain comes and goes  9. PREGNANCY: "Is there any chance you are pregnant?" "When was your last menstrual period?"     na  Protocols used: Urination Pain - Female-A-AH

## 2023-08-01 NOTE — Telephone Encounter (Unsigned)
 Copied from CRM [id number]. Topic: General - Other >> Jul 31, 2023  5:19 PM Emylou G wrote: Reason for CRM: please contact patient asap [phone] .Marland Kitchen She is upset still has heard her UTI results.

## 2023-08-02 ENCOUNTER — Other Ambulatory Visit: Payer: Self-pay

## 2023-08-02 DIAGNOSIS — C9000 Multiple myeloma not having achieved remission: Secondary | ICD-10-CM

## 2023-08-02 DIAGNOSIS — E876 Hypokalemia: Secondary | ICD-10-CM

## 2023-08-02 LAB — HSV-2 AB, IGG: HSV 2 IgG, Type Spec: NONREACTIVE

## 2023-08-03 ENCOUNTER — Inpatient Hospital Stay: Payer: Medicare Other

## 2023-08-03 ENCOUNTER — Inpatient Hospital Stay: Payer: Medicare Other | Attending: Hematology

## 2023-08-03 VITALS — BP 106/62 | HR 66 | Temp 98.0°F | Resp 18 | Wt 142.0 lb

## 2023-08-03 DIAGNOSIS — Z79899 Other long term (current) drug therapy: Secondary | ICD-10-CM | POA: Diagnosis not present

## 2023-08-03 DIAGNOSIS — C9000 Multiple myeloma not having achieved remission: Secondary | ICD-10-CM | POA: Diagnosis present

## 2023-08-03 DIAGNOSIS — E876 Hypokalemia: Secondary | ICD-10-CM

## 2023-08-03 DIAGNOSIS — Z5112 Encounter for antineoplastic immunotherapy: Secondary | ICD-10-CM | POA: Insufficient documentation

## 2023-08-03 LAB — CBC WITH DIFFERENTIAL/PLATELET
Abs Immature Granulocytes: 0.01 10*3/uL (ref 0.00–0.07)
Basophils Absolute: 0 10*3/uL (ref 0.0–0.1)
Basophils Relative: 1 %
Eosinophils Absolute: 0.1 10*3/uL (ref 0.0–0.5)
Eosinophils Relative: 3 %
HCT: 34.4 % — ABNORMAL LOW (ref 36.0–46.0)
Hemoglobin: 11.6 g/dL — ABNORMAL LOW (ref 12.0–15.0)
Immature Granulocytes: 0 %
Lymphocytes Relative: 28 %
Lymphs Abs: 1.1 10*3/uL (ref 0.7–4.0)
MCH: 35.4 pg — ABNORMAL HIGH (ref 26.0–34.0)
MCHC: 33.7 g/dL (ref 30.0–36.0)
MCV: 104.9 fL — ABNORMAL HIGH (ref 80.0–100.0)
Monocytes Absolute: 0.5 10*3/uL (ref 0.1–1.0)
Monocytes Relative: 13 %
Neutro Abs: 2.2 10*3/uL (ref 1.7–7.7)
Neutrophils Relative %: 55 %
Platelets: 126 10*3/uL — ABNORMAL LOW (ref 150–400)
RBC: 3.28 MIL/uL — ABNORMAL LOW (ref 3.87–5.11)
RDW: 14.2 % (ref 11.5–15.5)
WBC: 4 10*3/uL (ref 4.0–10.5)
nRBC: 0 % (ref 0.0–0.2)

## 2023-08-03 LAB — COMPREHENSIVE METABOLIC PANEL
ALT: 24 U/L (ref 0–44)
AST: 25 U/L (ref 15–41)
Albumin: 3.6 g/dL (ref 3.5–5.0)
Alkaline Phosphatase: 47 U/L (ref 38–126)
Anion gap: 9 (ref 5–15)
BUN: 14 mg/dL (ref 8–23)
CO2: 25 mmol/L (ref 22–32)
Calcium: 9 mg/dL (ref 8.9–10.3)
Chloride: 98 mmol/L (ref 98–111)
Creatinine, Ser: 0.86 mg/dL (ref 0.44–1.00)
GFR, Estimated: >60 mL/min (ref >60)
Glucose, Bld: 77 mg/dL (ref 70–99)
Potassium: 4.1 mmol/L (ref 3.5–5.1)
Sodium: 132 mmol/L — ABNORMAL LOW (ref 135–145)
Total Bilirubin: 0.5 mg/dL (ref 0.0–1.2)
Total Protein: 6.5 g/dL (ref 6.5–8.1)

## 2023-08-03 LAB — URINE CULTURE

## 2023-08-03 LAB — SUSCEPTIBILITY, AER + ANAEROB

## 2023-08-03 LAB — SPECIMEN STATUS REPORT

## 2023-08-03 MED ORDER — PROCHLORPERAZINE MALEATE 10 MG PO TABS
10.0000 mg | ORAL_TABLET | Freq: Once | ORAL | Status: AC
  Administered 2023-08-03: 10 mg via ORAL
  Filled 2023-08-03: qty 1

## 2023-08-03 MED ORDER — BORTEZOMIB CHEMO SQ INJECTION 3.5 MG (2.5MG/ML)
0.7000 mg/m2 | Freq: Once | INTRAMUSCULAR | Status: AC
  Administered 2023-08-03: 1.25 mg via SUBCUTANEOUS
  Filled 2023-08-03: qty 0.5

## 2023-08-03 MED ORDER — CLINDAMYCIN HCL 150 MG PO CAPS
150.0000 mg | ORAL_CAPSULE | Freq: Three times a day (TID) | ORAL | 0 refills | Status: DC
Start: 2023-08-03 — End: 2023-10-09

## 2023-08-03 NOTE — Addendum Note (Signed)
 Addended by: Syliva Overman E on: 08/03/2023 12:07 PM   Modules accepted: Orders

## 2023-08-03 NOTE — Patient Instructions (Signed)
 [facility] [place] - A DEPT OF [facility]  Discharge Instructions: Thank you for choosing [facility] to provide your oncology and hematology care.  If you have a lab appointment with the Cancer Center - please note that after April 8th, 2024, all labs will be drawn in the cancer center.  You do not have to check in or register with the main entrance as you have in the past but will complete your check-in in the cancer center.  Wear comfortable clothing and clothing appropriate for easy access to any Portacath or PICC line.   We strive to give you quality time with your provider. You may need to reschedule your appointment if you arrive late (15 or more minutes).  Arriving late affects you and other patients whose appointments are after yours.  Also, if you miss three or more appointments without notifying the office, you may be dismissed from the clinic at the provider's discretion.      For prescription refill requests, have your pharmacy contact our office and allow 72 hours for refills to be completed.    Today you received the following chemotherapy and/or immunotherapy agents Velcade.  Bortezomib Injection What is this medication? BORTEZOMIB (bor TEZ oh mib) treats lymphoma. It may also be used to treat multiple myeloma, a type of bone marrow cancer. It works by blocking a protein that causes cancer cells to grow and multiply. This helps to slow or stop the spread of cancer cells. This medicine may be used for other purposes; ask your health care provider or pharmacist if you have questions. COMMON BRAND NAME(S): BORUZU, Velcade What should I tell my care team before I take this medication? They need to know if you have any of these conditions: Dehydration Diabetes Heart disease Liver disease Tingling of the fingers or toes or other nerve disorder An unusual or allergic reaction to bortezomib, other medications, foods, dyes, or preservatives If  you or your partner are pregnant or trying to get pregnant Breastfeeding How should I use this medication? This medication is injected into a vein or under the skin. It is given by your care team in a hospital or clinic setting. Talk to your care team about the use of this medication in children. Special care may be needed. Overdosage: If you think you have taken too much of this medicine contact a poison control center or emergency room at once. NOTE: This medicine is only for you. Do not share this medicine with others. What if I miss a dose? Keep appointments for follow-up doses. It is important not to miss your dose. Call your care team if you are unable to keep an appointment. What may interact with this medication? Ketoconazole Rifampin This list may not describe all possible interactions. Give your health care provider a list of all the medicines, herbs, non-prescription drugs, or dietary supplements you use. Also tell them if you smoke, drink alcohol, or use illegal drugs. Some items may interact with your medicine. What should I watch for while using this medication? Your condition will be monitored carefully while you are receiving this medication. You may need blood work while taking this medication. This medication may affect your coordination, reaction time, or judgment. Do not drive or operate machinery until you know how this medication affects you. Sit up or stand slowly to reduce the risk of dizzy or fainting spells. Drinking alcohol with this medication can increase the risk of these side effects.  This medication may increase your risk of getting an infection. Call your care team for advice if you get a fever, chills, sore throat, or other symptoms of a cold or flu. Do not treat yourself. Try to avoid being around people who are sick. Check with your care team if you have severe diarrhea, nausea, and vomiting, or if you sweat a lot. The loss of too much body fluid may make it  dangerous for you to take this medication. Talk to your care team if you may be pregnant. Serious birth defects can occur if you take this medication during pregnancy and for 7 months after the last dose. You will need a negative pregnancy test before starting this medication. Contraception is recommended while taking this medication and for 7 months after the last dose. Your care team can help you find the option that works for you. If your partner can get pregnant, use a condom during sex while taking this medication and for 4 months after the last dose. Do not breastfeed while taking this medication and for 2 months after the last dose. This medication may cause infertility. Talk to your care team if you are concerned about your fertility. What side effects may I notice from receiving this medication? Side effects that you should report to your care team as soon as possible: Allergic reactions--skin rash, itching, hives, swelling of the face, lips, tongue, or throat Bleeding--bloody or black, tar-like stools, vomiting blood or [place] material that looks like coffee grounds, red or dark [place] urine, small red or purple spots on skin, unusual bruising or bleeding Bleeding in the brain--severe headache, stiff neck, confusion, dizziness, change in vision, numbness or weakness of the face, arm, or leg, trouble speaking, trouble walking, vomiting Bowel blockage--stomach cramping, unable to have a bowel movement or pass gas, loss of appetite, vomiting Heart failure--shortness of breath, swelling of the ankles, feet, or hands, sudden weight gain, unusual weakness or fatigue Infection--fever, chills, cough, sore throat, wounds that don't heal, pain or trouble when passing urine, general feeling of discomfort or being unwell Liver injury--right upper belly pain, loss of appetite, nausea, light-colored stool, dark yellow or [place] urine, yellowing skin or eyes, unusual weakness or fatigue Low blood  pressure--dizziness, feeling faint or lightheaded, blurry vision Lung injury--shortness of breath or trouble breathing, cough, spitting up blood, chest pain, fever Pain, tingling, or numbness in the hands or feet Severe or prolonged diarrhea Stomach pain, bloody diarrhea, pale skin, unusual weakness or fatigue, decrease in the amount of urine, which may be signs of hemolytic uremic syndrome Sudden and severe headache, confusion, change in vision, seizures, which may be signs of posterior reversible encephalopathy syndrome (PRES) TTP--purple spots on the skin or inside the mouth, pale skin, yellowing skin or eyes, unusual weakness or fatigue, fever, fast or irregular heartbeat, confusion, change in vision, trouble speaking, trouble walking Tumor lysis syndrome (TLS)--nausea, vomiting, diarrhea, decrease in the amount of urine, dark urine, unusual weakness or fatigue, confusion, muscle pain or cramps, fast or irregular heartbeat, joint pain Side effects that usually do not require medical attention (report to your care team if they continue or are bothersome): Constipation Diarrhea Fatigue Loss of appetite Nausea This list may not describe all possible side effects. Call your doctor for medical advice about side effects. You may report side effects to FDA at 1-800-FDA-1088. Where should I keep my medication? This medication is given in a hospital or clinic. It will not be stored at home. NOTE: This sheet  is a summary. It may not cover all possible information. If you have questions about this medicine, talk to your doctor, pharmacist, or health care provider.  2024 Elsevier/Gold Standard (2021-10-12 00:00:00)       To help prevent nausea and vomiting after your treatment, we encourage you to take your nausea medication as directed.  BELOW ARE SYMPTOMS THAT SHOULD BE REPORTED IMMEDIATELY: *FEVER GREATER THAN 100.4 F (38 C) OR HIGHER *CHILLS OR SWEATING *NAUSEA AND VOMITING THAT IS NOT  CONTROLLED WITH YOUR NAUSEA MEDICATION *UNUSUAL SHORTNESS OF BREATH *UNUSUAL BRUISING OR BLEEDING *URINARY PROBLEMS (pain or burning when urinating, or frequent urination) *BOWEL PROBLEMS (unusual diarrhea, constipation, pain near the anus) TENDERNESS IN MOUTH AND THROAT WITH OR WITHOUT PRESENCE OF ULCERS (sore throat, sores in mouth, or a toothache) UNUSUAL RASH, SWELLING OR PAIN  UNUSUAL VAGINAL DISCHARGE OR ITCHING   Items with * indicate a potential emergency and should be followed up as soon as possible or go to the Emergency Department if any problems should occur.  Please show the CHEMOTHERAPY ALERT CARD or IMMUNOTHERAPY ALERT CARD at check-in to the Emergency Department and triage nurse.  Should you have questions after your visit or need to cancel or reschedule your appointment, please contact [facility]  - A DEPT OF [facility] [phone]  and follow the prompts.  Office hours are 8:00 a.m. to 4:30 p.m. Monday - Friday. Please note that voicemails left after 4:00 p.m. may not be returned until the following business day.  We are closed weekends and major holidays. You have access to a nurse at all times for urgent questions. Please call the main number to the clinic [phone] and follow the prompts.  For any non-urgent questions, you may also contact your provider using MyChart. We now offer e-Visits for anyone 29 and older to request care online for non-urgent symptoms. For details visit [email or web address].   Also download the MyChart app! Go to the app store, search "MyChart", open the app, select [place], and log in with your MyChart username and password.

## 2023-08-03 NOTE — Progress Notes (Signed)
 Patient presents today for Velcade injection.  Patient is in satisfactory condition with no new complaints voiced.  Vital signs are stable.  Labs reviewed and all labs are within treatment parameters.  We will proceed with treatment per MD orders.    Patient tolerated injection well with no complaints voiced.  Patient left via wheelchair in stable condition.  Vital signs stable at discharge.  Follow up as scheduled.

## 2023-08-09 ENCOUNTER — Other Ambulatory Visit: Payer: Self-pay

## 2023-08-09 DIAGNOSIS — C9000 Multiple myeloma not having achieved remission: Secondary | ICD-10-CM

## 2023-08-09 DIAGNOSIS — Z113 Encounter for screening for infections with a predominantly sexual mode of transmission: Secondary | ICD-10-CM | POA: Insufficient documentation

## 2023-08-09 NOTE — Assessment & Plan Note (Signed)
 hSV 2 test negative report

## 2023-08-09 NOTE — Assessment & Plan Note (Signed)
 Resubmit urine for sensitivity due to persistent symptoms, sensitivity reported after visit and appropriate antibiotic prescribed based oin the report

## 2023-08-09 NOTE — Progress Notes (Signed)
   Courtney Rogers     MRN: [id number]      DOB: 1950/07/25  Chief Complaint  Patient presents with   Acute Visit    Persistent urinary frequency w/ burning w/ pain in the rt side x 2 wks     HPI Courtney Rogers is here with above complaint She denies fever or chills but does c/o right sided pain, non radiaiting, no nausea or vomiting, main symptom is burning with urination Urine culture is posistive and sensitivity is being determined. Pt is uncomfortable and miserable due to persistence of symptoinms ROS Denies recent fever or chills. Denies sinus pressure, nasal congestion, ear pain or sore throat. Denies chest congestion, productive cough or wheezing. Denies chest pains, palpitations and leg swelling  PE  BP 107/63   Pulse 74   Ht 5\' 4"  (1.626 m)   Wt 139 lb (63 kg)   SpO2 96%   BMI 23.86 kg/m   Patient alert and oriented and in no cardiopulmonary distress.  HEENT: No facial asymmetry, EOMI,     Neck supple .  Chest: Clear to auscultation bilaterally.  CVS: S1, S2 no murmurs, no S3.Regular rate.  ABD: Soft non tender. No renal angle tenderness   Ext: No edema  MS: decreased  ROM spine, shoulders, hips and knees.  Skin: Intact, no ulcerations or rash noted.  Psych: Good eye contact, normal affect. Memory intact mildly  anxious not  depressed appearing.  CNS: CN 2-12 intact, power,  normal throughout.no focal deficits noted.   Assessment & Plan  Urinary tract infection symptoms Resubmit urine for sensitivity due to persistent symptoms, sensitivity reported after visit and appropriate antibiotic prescribed based oin the report  Screening for STD (sexually transmitted disease) hSV 2 test negative report

## 2023-08-10 ENCOUNTER — Inpatient Hospital Stay: Payer: Medicare Other

## 2023-08-10 DIAGNOSIS — C9000 Multiple myeloma not having achieved remission: Secondary | ICD-10-CM

## 2023-08-10 DIAGNOSIS — Z5112 Encounter for antineoplastic immunotherapy: Secondary | ICD-10-CM | POA: Diagnosis not present

## 2023-08-10 LAB — COMPREHENSIVE METABOLIC PANEL
ALT: 21 U/L (ref 0–44)
AST: 27 U/L (ref 15–41)
Albumin: 4 g/dL (ref 3.5–5.0)
Alkaline Phosphatase: 39 U/L (ref 38–126)
Anion gap: 10 (ref 5–15)
BUN: 13 mg/dL (ref 8–23)
CO2: 24 mmol/L (ref 22–32)
Calcium: 8.8 mg/dL — ABNORMAL LOW (ref 8.9–10.3)
Chloride: 97 mmol/L — ABNORMAL LOW (ref 98–111)
Creatinine, Ser: 0.86 mg/dL (ref 0.44–1.00)
GFR, Estimated: >60 mL/min (ref >60)
Glucose, Bld: 80 mg/dL (ref 70–99)
Potassium: 3.5 mmol/L (ref 3.5–5.1)
Sodium: 131 mmol/L — ABNORMAL LOW (ref 135–145)
Total Bilirubin: 0.6 mg/dL (ref 0.0–1.2)
Total Protein: 7 g/dL (ref 6.5–8.1)

## 2023-08-10 LAB — CBC WITH DIFFERENTIAL/PLATELET
Abs Immature Granulocytes: 0 10*3/uL (ref 0.00–0.07)
Basophils Absolute: 0 10*3/uL (ref 0.0–0.1)
Basophils Relative: 1 %
Eosinophils Absolute: 0.1 10*3/uL (ref 0.0–0.5)
Eosinophils Relative: 2 %
HCT: 35.1 % — ABNORMAL LOW (ref 36.0–46.0)
Hemoglobin: 12.1 g/dL (ref 12.0–15.0)
Immature Granulocytes: 0 %
Lymphocytes Relative: 33 %
Lymphs Abs: 1.3 10*3/uL (ref 0.7–4.0)
MCH: 36 pg — ABNORMAL HIGH (ref 26.0–34.0)
MCHC: 34.5 g/dL (ref 30.0–36.0)
MCV: 104.5 fL — ABNORMAL HIGH (ref 80.0–100.0)
Monocytes Absolute: 0.6 10*3/uL (ref 0.1–1.0)
Monocytes Relative: 14 %
Neutro Abs: 1.9 10*3/uL (ref 1.7–7.7)
Neutrophils Relative %: 50 %
Platelets: 117 10*3/uL — ABNORMAL LOW (ref 150–400)
RBC: 3.36 MIL/uL — ABNORMAL LOW (ref 3.87–5.11)
RDW: 14 % (ref 11.5–15.5)
WBC: 3.9 10*3/uL — ABNORMAL LOW (ref 4.0–10.5)
nRBC: 0 % (ref 0.0–0.2)

## 2023-08-10 LAB — MAGNESIUM: Magnesium: 2.2 mg/dL (ref 1.7–2.4)

## 2023-08-11 LAB — KAPPA/LAMBDA LIGHT CHAINS
Kappa free light chain: 13.9 mg/L (ref 3.3–19.4)
Kappa, lambda light chain ratio: 0.78 (ref 0.26–1.65)
Lambda free light chains: 17.9 mg/L (ref 5.7–26.3)

## 2023-08-12 ENCOUNTER — Emergency Department (HOSPITAL_COMMUNITY)

## 2023-08-12 ENCOUNTER — Encounter (HOSPITAL_COMMUNITY): Payer: Self-pay

## 2023-08-12 ENCOUNTER — Other Ambulatory Visit: Payer: Self-pay

## 2023-08-12 ENCOUNTER — Emergency Department (HOSPITAL_COMMUNITY): Admission: EM | Admit: 2023-08-12 | Discharge: 2023-08-12 | Disposition: A | Discharge status: Home / Self Care

## 2023-08-12 DIAGNOSIS — Z8579 Personal history of other malignant neoplasms of lymphoid, hematopoietic and related tissues: Secondary | ICD-10-CM | POA: Diagnosis not present

## 2023-08-12 DIAGNOSIS — B3731 Acute candidiasis of vulva and vagina: Secondary | ICD-10-CM | POA: Diagnosis not present

## 2023-08-12 DIAGNOSIS — E876 Hypokalemia: Secondary | ICD-10-CM | POA: Insufficient documentation

## 2023-08-12 DIAGNOSIS — Z7901 Long term (current) use of anticoagulants: Secondary | ICD-10-CM | POA: Diagnosis not present

## 2023-08-12 DIAGNOSIS — I10 Essential (primary) hypertension: Secondary | ICD-10-CM | POA: Insufficient documentation

## 2023-08-12 DIAGNOSIS — Z79899 Other long term (current) drug therapy: Secondary | ICD-10-CM | POA: Diagnosis not present

## 2023-08-12 DIAGNOSIS — Z7982 Long term (current) use of aspirin: Secondary | ICD-10-CM | POA: Diagnosis not present

## 2023-08-12 DIAGNOSIS — R350 Frequency of micturition: Secondary | ICD-10-CM | POA: Diagnosis present

## 2023-08-12 LAB — LIPASE, BLOOD: Lipase: 46 U/L (ref 11–51)

## 2023-08-12 LAB — URINALYSIS, ROUTINE W REFLEX MICROSCOPIC
Bilirubin Urine: NEGATIVE
Glucose, UA: NEGATIVE mg/dL
Hgb urine dipstick: NEGATIVE
Ketones, ur: NEGATIVE mg/dL
Nitrite: NEGATIVE
Protein, ur: NEGATIVE mg/dL
Specific Gravity, Urine: 1.01 (ref 1.005–1.030)
pH: 7 (ref 5.0–8.0)

## 2023-08-12 LAB — WET PREP, GENITAL
Clue Cells Wet Prep HPF POC: NONE SEEN
Sperm: NONE SEEN
Trich, Wet Prep: NONE SEEN
WBC, Wet Prep HPF POC: >=10 — AB (ref <10)

## 2023-08-12 LAB — COMPREHENSIVE METABOLIC PANEL
ALT: 20 U/L (ref 0–44)
AST: 25 U/L (ref 15–41)
Albumin: 3.8 g/dL (ref 3.5–5.0)
Alkaline Phosphatase: 37 U/L — ABNORMAL LOW (ref 38–126)
Anion gap: 13 (ref 5–15)
BUN: 11 mg/dL (ref 8–23)
CO2: 24 mmol/L (ref 22–32)
Calcium: 8.7 mg/dL — ABNORMAL LOW (ref 8.9–10.3)
Chloride: 94 mmol/L — ABNORMAL LOW (ref 98–111)
Creatinine, Ser: 0.73 mg/dL (ref 0.44–1.00)
GFR, Estimated: >60 mL/min (ref >60)
Glucose, Bld: 92 mg/dL (ref 70–99)
Potassium: 2.9 mmol/L — ABNORMAL LOW (ref 3.5–5.1)
Sodium: 131 mmol/L — ABNORMAL LOW (ref 135–145)
Total Bilirubin: 0.6 mg/dL (ref 0.0–1.2)
Total Protein: 6.7 g/dL (ref 6.5–8.1)

## 2023-08-12 LAB — CBC WITH DIFFERENTIAL/PLATELET
Abs Immature Granulocytes: 0.01 10*3/uL (ref 0.00–0.07)
Basophils Absolute: 0 10*3/uL (ref 0.0–0.1)
Basophils Relative: 1 %
Eosinophils Absolute: 0.1 10*3/uL (ref 0.0–0.5)
Eosinophils Relative: 2 %
HCT: 34 % — ABNORMAL LOW (ref 36.0–46.0)
Hemoglobin: 11.6 g/dL — ABNORMAL LOW (ref 12.0–15.0)
Immature Granulocytes: 0 %
Lymphocytes Relative: 28 %
Lymphs Abs: 0.9 10*3/uL (ref 0.7–4.0)
MCH: 35.5 pg — ABNORMAL HIGH (ref 26.0–34.0)
MCHC: 34.1 g/dL (ref 30.0–36.0)
MCV: 104 fL — ABNORMAL HIGH (ref 80.0–100.0)
Monocytes Absolute: 0.4 10*3/uL (ref 0.1–1.0)
Monocytes Relative: 13 %
Neutro Abs: 1.8 10*3/uL (ref 1.7–7.7)
Neutrophils Relative %: 56 %
Platelets: 112 10*3/uL — ABNORMAL LOW (ref 150–400)
RBC: 3.27 MIL/uL — ABNORMAL LOW (ref 3.87–5.11)
RDW: 13.6 % (ref 11.5–15.5)
WBC: 3.1 10*3/uL — ABNORMAL LOW (ref 4.0–10.5)
nRBC: 0 % (ref 0.0–0.2)

## 2023-08-12 LAB — PROTIME-INR
INR: 2.2 — ABNORMAL HIGH (ref 0.8–1.2)
Prothrombin Time: 24.5 s — ABNORMAL HIGH (ref 11.4–15.2)

## 2023-08-12 MED ORDER — FLUCONAZOLE 150 MG PO TABS
150.0000 mg | ORAL_TABLET | Freq: Once | ORAL | 0 refills | Status: AC
Start: 2023-08-12 — End: 2023-08-12

## 2023-08-12 MED ORDER — POTASSIUM CHLORIDE CRYS ER 20 MEQ PO TBCR: 20.0000 meq | EXTENDED_RELEASE_TABLET | Freq: Two times a day (BID) | ORAL | 0 refills | Status: DC

## 2023-08-12 MED ORDER — FLUCONAZOLE 150 MG PO TABS
150.0000 mg | ORAL_TABLET | Freq: Once | ORAL | Status: AC
  Administered 2023-08-12: 150 mg via ORAL
  Filled 2023-08-12: qty 1

## 2023-08-12 MED ORDER — POTASSIUM CHLORIDE CRYS ER 20 MEQ PO TBCR
40.0000 meq | EXTENDED_RELEASE_TABLET | Freq: Once | ORAL | Status: AC
  Administered 2023-08-12: 40 meq via ORAL
  Filled 2023-08-12: qty 2

## 2023-08-12 NOTE — Discharge Instructions (Addendum)
 You are seen today for pain in your right side and burning with urination.  You do have a yeast infection, you do not appear to have UTI but we sent your urine for culture.  Your potassium was low at 2.9 so I have prescribed additional potassium to take for the next 3 days.  You will need close recheck with your PCP.  We also give you a dose of the Diflucan to treat the yeast infection.  This medication can affect your INR (coumadin levels) and potentially creased your risk of bleeding.  Have your INR rechecked on Monday.  I prescribed another dose of the Diflucan to take only if you are still having vaginal itching after 3 days.  Again if you need to take this have your INR rechecked several days later.

## 2023-08-12 NOTE — ED Provider Notes (Signed)
 [facility] AT [facility] Provider Note   CSN: [id number] Arrival date & time: 08/12/23  1914     History  Chief Complaint  Patient presents with   Urinary Frequency    Courtney Rogers is a 73 y.o. female.  She is PMH of multiple myeloma,, anemia, GERD, hypertension.  Presents to ER for evaluation today of right flank pain and dysuria.  Is been ongoing since the end of February.  She was diagnosed with a UTI and records show culture grew out lactobacillus.  She was treated with clindamycin, states she had some mild improvement but symptoms returned and she said consistent dysuria, has had multiple PCP visits since resolving symptoms.  Also has right flank pain that is remittent.  She denies any blood in the urine, no vaginal bleeding but she does report some vaginal itching and thinks there is a possibility that she has a vaginal infection as well.  No nausea or vomiting or fevers or other complaints.   Urinary Frequency       Home Medications Prior to Admission medications   Medication Sig Start Date End Date Taking? Authorizing Provider  fluconazole (DIFLUCAN) 150 MG tablet Take 1 tablet (150 mg total) by mouth once for 1 dose. Take if your symptoms persist after 3 days 08/12/23 08/12/23 Yes Joesphine Schemm A, PA-C  acetaminophen (TYLENOL) 500 MG tablet Take 1,000 mg by mouth 2 (two) times daily as needed for moderate pain or headache.    [provider]  acyclovir (ZOVIRAX) 400 MG tablet TAKE ONE TABLET BY MOUTH TWICE DAILY 07/04/23   Doreatha Massed, MD  amLODipine (NORVASC) 10 MG tablet TAKE ONE TABLET BY MOUTH ONCE DAILY 04/06/23   Doreatha Massed, MD  ascorbic acid (VITAMIN C) 500 MG tablet Take 500 mg by mouth daily. 02/07/22   [provider]  aspirin EC 81 MG tablet Take 81 mg by mouth at bedtime.    [provider]  B Complex Vitamins (VITAMIN B COMPLEX) TABS Take 1 tablet by mouth daily.    [provider]  B Complex-Folic Acid (B COMPLEX VITAMINS, W/ FA,) CAPS Take 1 capsule by mouth daily. 09/09/21   [provider]  bortezomib SQ (VELCADE) 3.5 MG injection Inject into the skin once. 08/10/21   [provider]  busPIRone (BUSPAR) 5 MG tablet Take 5 mg by mouth daily.    Elam Dutch, MD  Calcium Carbonate-Vitamin D (CALCIUM 600+D PO) Take 1 tablet by mouth 2 (two) times daily.    [provider]  chlorthalidone (HYGROTON) 25 MG tablet Take 25 mg by mouth daily. 06/04/22   [provider]  cholecalciferol (VITAMIN D3) 10 MCG (400 UNIT) TABS tablet Take 400 Units by mouth daily. 09/30/22   [provider]  clindamycin (CLEOCIN) 150 MG capsule Take 1 capsule (150 mg total) by mouth 3 (three) times daily. 08/03/23   Kerri Perches, MD  cyanocobalamin (VITAMIN B12) 1000 MCG tablet Take by mouth. 10/12/20   [provider]  diclofenac Sodium (VOLTAREN) 1 % GEL Apply 4 g topically 4 (four) times daily. 11/19/21   Gilmore Laroche, FNP  fluticasone (FLONASE) 50 MCG/ACT nasal spray USE 2 SPRAYS IN EACH NOSTRIL ONCE DAILY 06/21/23   Gilmore Laroche, FNP  fluticasone-salmeterol (ADVAIR) 250-50 MCG/ACT AEPB Inhale into the lungs. 10/12/20   [provider]  folic acid (FOLVITE) 1 MG tablet TAKE ONE TABLET BY MOUTH EVERY DAY 03/17/23   Doreatha Massed, MD  gabapentin (  NEURONTIN) 100 MG capsule Take 100 mg by mouth 3 (three) times daily. 05/07/19   [provider]  HYDROcodone-acetaminophen (NORCO/VICODIN) 5-325 MG tablet TAKE ONE TABLET BY MOUTH EVERY 8 HOURS AS NEEDED FOR MODERATE PAIN 10/10/22   Doreatha Massed, MD  hydrOXYzine (VISTARIL) 25 MG capsule Take 1 capsule (25 mg total) by mouth at bedtime as needed. 02/24/23   Gilmore Laroche, FNP  Hyoscyamine Sulfate SL 0.125 MG SUBL     [provider]  lenalidomide (REVLIMID) 10 MG capsule Take 1 capsule (10 mg total) by mouth daily. 21 days on, 7 days off every  28 days 02/14/23   Doreatha Massed, MD  levocetirizine (XYZAL) 5 MG tablet TAKE ONE TABLET BY MOUTH IN THE EVENING 05/03/23   Gilmore Laroche, FNP  lidocaine (HM LIDOCAINE PATCH) 4 % Place 1 patch onto the skin daily. 04/12/23   Halford Decamp, PA-C  losartan (COZAAR) 100 MG tablet TAKE ONE TABLET BY MOUTH ONCE DAILY 05/03/23   Gilmore Laroche, FNP  magnesium oxide (MAG-OX) 400 (240 Mg) MG tablet TAKE ONE TABLET BY MOUTH TWICE DAILY 04/06/23   Doreatha Massed, MD  metoprolol tartrate (LOPRESSOR) 50 MG tablet Take 1 tablet (50 mg total) by mouth 2 (two) times daily. 10/07/21   [name], FNP  montelukast (SINGULAIR) 10 MG tablet TAKE ONE TABLET BY MOUTH AT BEDTIME 06/01/23   Gilmore Laroche, FNP  MYRBETRIQ 50 MG TB24 tablet TAKE ONE TABLET BY MOUTH ONCE DAILY 06/01/23   Gilmore Laroche, FNP  nitroGLYCERIN (NITROSTAT) 0.4 MG SL tablet Place 0.4 mg under the tongue every 5 (five) minutes as needed for chest pain.    [provider]  pantoprazole (PROTONIX) 40 MG tablet TAKE ONE TABLET BY MOUTH TWICE DAILY 05/03/23   Gilmore Laroche, FNP  potassium chloride SA (KLOR-CON M) 20 MEQ tablet Take 1 tablet (20 mEq total) by mouth 2 (two) times daily. 08/12/23   Carmel Sacramento A, PA-C  predniSONE (DELTASONE) 20 MG tablet Take 2 tablets (40 mg total) by mouth daily with breakfast. 06/27/23   Anabel Halon, MD  primidone (MYSOLINE) 50 MG tablet TAKE ONE TABLET BY MOUTH TWICE DAILY MORNING AND BEDTIME 06/05/23   Gilmore Laroche, FNP  Probiotic Product (GNP PROBIOTIC COLON SUPPORT) CAPS Take 1 capsule by mouth daily. 07/08/21   [provider]  Saccharomyces boulardii (PROBIOTIC) 250 MG CAPS Take 1 capsule by mouth daily. 12/09/22   [provider]  sertraline (ZOLOFT) 25 MG tablet TAKE ONE TABLET BY MOUTH ONCE DAILY 07/04/23   Gilmore Laroche, FNP  simvastatin (ZOCOR) 40 MG tablet TAKE ONE TABLET BY MOUTH AT BEDTIME 07/04/23   Gilmore Laroche, FNP   sulfamethoxazole-trimethoprim (BACTRIM DS) 800-160 MG tablet Take 1 tablet by mouth 2 (two) times daily. 07/21/23   Kerri Perches, MD  tadalafil, PAH, (ALYQ) 20 MG tablet Take 2 tablets every day by oral route.    [provider]  terbinafine (LAMISIL) 250 MG tablet Take 1 tablet (250 mg total) by mouth daily. 07/18/23   Kerri Perches, MD  topiramate (TOPAMAX) 25 MG tablet Take 25 mg by mouth 2 (two) times daily. 01/06/23   [provider]  warfarin (COUMADIN) 3 MG tablet Take 3 mg by mouth daily. 06/23/21   [provider]      Allergies    Amoxicillin-pot clavulanate, Clarithromycin, Erythromycin, Lisinopril, and Amoxicillin    Review of Systems   Review of Systems  Genitourinary:  Positive for frequency.    Physical  Exam Updated Vital Signs BP 138/75   Pulse 61   Temp 98.2 F (36.8 C) (Oral)   Resp 18   Ht 5\' 4"  (1.626 m)   Wt 63.5 kg   SpO2 97%   BMI 24.03 kg/m  Physical Exam Vitals and nursing note reviewed.  Constitutional:      General: She is not in acute distress.    Appearance: She is well-developed.  HENT:     Head: Normocephalic and atraumatic.  Eyes:     Conjunctiva/sclera: Conjunctivae normal.  Cardiovascular:     Rate and Rhythm: Normal rate and regular rhythm.     Heart sounds: No murmur heard. Pulmonary:     Effort: Pulmonary effort is normal. No respiratory distress.     Breath sounds: Normal breath sounds.  Abdominal:     Palpations: Abdomen is soft.     Tenderness: There is no abdominal tenderness. There is no right CVA tenderness, left CVA tenderness, guarding or rebound.     Comments: Mild tenderness to right lateral abdomen  Musculoskeletal:        General: No swelling.     Cervical back: Neck supple.  Skin:    General: Skin is warm and dry.     Capillary Refill: Capillary refill takes less than 2 seconds.  Neurological:     General: No focal deficit present.     Mental Status: She is alert and oriented  to person, place, and time.  Psychiatric:        Mood and Affect: Mood normal.     ED Results / Procedures / Treatments   Labs (all labs ordered are listed, but only abnormal results are displayed) Labs Reviewed  WET PREP, GENITAL - Abnormal; Notable for the following components:      Result Value   Yeast Wet Prep HPF POC PRESENT (*)    WBC, Wet Prep HPF POC >=10 (*)    All other components within normal limits  URINALYSIS, ROUTINE W REFLEX MICROSCOPIC - Abnormal; Notable for the following components:   APPearance HAZY (*)    Leukocytes,Ua SMALL (*)    Bacteria, UA RARE (*)    All other components within normal limits  COMPREHENSIVE METABOLIC PANEL - Abnormal; Notable for the following components:   Sodium 131 (*)    Potassium 2.9 (*)    Chloride 94 (*)    Calcium 8.7 (*)    Alkaline Phosphatase 37 (*)    All other components within normal limits  CBC WITH DIFFERENTIAL/PLATELET - Abnormal; Notable for the following components:   WBC 3.1 (*)    RBC 3.27 (*)    Hemoglobin 11.6 (*)    HCT 34.0 (*)    MCV 104.0 (*)    MCH 35.5 (*)    Platelets 112 (*)    All other components within normal limits  PROTIME-INR - Abnormal; Notable for the following components:   Prothrombin Time 24.5 (*)    INR 2.2 (*)    All other components within normal limits  URINE CULTURE  LIPASE, BLOOD    EKG None  Radiology CT ABDOMEN PELVIS WO CONTRAST Result Date: 08/12/2023 CLINICAL DATA:  Right flank pain and urinary frequency. EXAM: CT ABDOMEN AND PELVIS WITHOUT CONTRAST TECHNIQUE: Multidetector CT imaging of the abdomen and pelvis was performed following the standard protocol without IV contrast. RADIATION DOSE REDUCTION: This exam was performed according to the departmental dose-optimization program which includes automated exposure control, adjustment of the mA and/or kV according to patient  size and/or use of iterative reconstruction technique. COMPARISON:  02/12/2023. FINDINGS: Lower chest:  Basilar bronchiectasis, calcification and scarring in the lung bases. Heart is at the upper limits of normal in size. No pericardial or pleural effusion. Distal esophagus is grossly unremarkable. Hepatobiliary: Liver and gallbladder are unremarkable. No biliary ductal dilatation. Pancreas: Negative. Spleen: Negative. Adrenals/Urinary Tract: Adrenal glands are unremarkable. Small stone in the lower pole right kidney. Low-attenuation lesion in the right kidney. No specific follow-up necessary. Kidneys are otherwise unremarkable. Ureters are decompressed. Bladder is grossly unremarkable. Stomach/Bowel: Stomach, small bowel and colon are unremarkable. Appendix is not well-visualized. Vascular/Lymphatic: Vascular structures are unremarkable. No pathologically enlarged lymph nodes. Reproductive: Hysterectomy.  No adnexal mass. Other: No free fluid.  Mesenteries and peritoneum are unremarkable. Musculoskeletal: Osteopenia. Degenerative changes in the spine. L1 vertebral body augmentation. Minimal grade 1 anterolisthesis of L3 on L4. IMPRESSION: 1. No acute findings to explain the patient's clinical history. 2. Small right renal stone. Electronically Signed   By: Leanna Battles M.D.   On: 08/12/2023 11:13    Procedures Procedures    Medications Ordered in ED Medications  potassium chloride SA (KLOR-CON M) CR tablet 40 mEq (40 mEq Oral Given 08/12/23 1112)  fluconazole (DIFLUCAN) tablet 150 mg (150 mg Oral Given 08/12/23 1112)    ED Course/ Medical Decision Making/ A&P                                 Medical Decision Making Ddx: UTI, vaginitis, kidney stone, muscle strain  ED course: Patient with history of multiple myeloma, here for intermittent right flank pain and dysuria, initially treated for UTI with clindamycin based on culture results but has had some persistent symptoms having vaginal itching as well.  She does have candidal infection based on her vaginal swab.  Will treat for this with Diflucan.   Patient is on warfarin, INR is therapeutic at 2.2 and she was advised that the Diflucan can increase her INR level, she has an appointment already scheduled for Monday to have this rechecked.  She is informed if she needs to take second dose of Diflucan she would need close recheck of her INR after taking that as well.  Also noted on labs to have potassium 2.9.  Will increase potassium for next several days.  She was given potassium replacement here in the ED as well.  Vies to recheck with PCP for this as well.  Normal renal function, CT was unremarkable for any acute findings.  INR within normal limits  Amount and/or Complexity of Data Reviewed Labs: ordered.    Details: Potassium 2.9, no UTI, INR 2.2, lipase normal, CMP shows potassium 2.9.  CBC shows white blood count 3.4, hemoglobin 11.6 , and platelets 112 allaround her usual baseline. Radiology: ordered and independent interpretation performed.    Details: CT abdomen pelvis shows no obstructive uropathy but no obstruction or other acute findings, agree with radiology read  Risk Prescription drug management.           Final Clinical Impression(s) / ED Diagnoses Final diagnoses:  Vaginal candidiasis  Hypokalemia    Rx / DC Orders ED Discharge Orders          Ordered    potassium chloride SA (KLOR-CON M) 20 MEQ tablet  2 times daily,   Status:  Discontinued        08/12/23 1058    fluconazole (DIFLUCAN) 150 MG tablet  Once        08/12/23 1148    potassium chloride SA (KLOR-CON M) 20 MEQ tablet  2 times daily        08/12/23 [address] 08/12/23 1919    Durwin Glaze, MD 08/13/23 [phone]

## 2023-08-12 NOTE — ED Triage Notes (Signed)
 Pt states rt flank pain and urinary frequency. Pt states she has been back and forth to the doctor for over a month prescribed medications and nothing is helping. Pt states burning sensation when urinating.

## 2023-08-14 LAB — URINE CULTURE: Culture: 70000 — AB

## 2023-08-14 LAB — PROTEIN ELECTROPHORESIS, SERUM
A/G Ratio: 1.4 (ref 0.7–1.7)
Albumin ELP: 3.8 g/dL (ref 2.9–4.4)
Alpha-1-Globulin: 0.3 g/dL (ref 0.0–0.4)
Alpha-2-Globulin: 0.6 g/dL (ref 0.4–1.0)
Beta Globulin: 1 g/dL (ref 0.7–1.3)
Gamma Globulin: 0.8 g/dL (ref 0.4–1.8)
Globulin, Total: 2.7 g/dL (ref 2.2–3.9)
M-Spike, %: 0.4 g/dL — ABNORMAL HIGH
Total Protein ELP: 6.5 g/dL (ref 6.0–8.5)

## 2023-08-15 ENCOUNTER — Telehealth (HOSPITAL_BASED_OUTPATIENT_CLINIC_OR_DEPARTMENT_OTHER): Payer: Self-pay

## 2023-08-15 NOTE — Telephone Encounter (Signed)
 Post ED Visit - Positive Culture Follow-up  Culture report reviewed by antimicrobial stewardship pharmacist: [facility] []  Enzo Bi, Pharm.D. []  Celedonio Miyamoto, Pharm.D., BCPS AQ-ID []  Garvin Fila, Pharm.D., BCPS []  Georgina Pillion, [address].D., BCPS []  [place], [place].D., BCPS, AAHIVP []  Estella Husk, Pharm.D., BCPS, AAHIVP []  Lysle Pearl, PharmD, BCPS []  Phillips Climes, PharmD, BCPS []  Agapito Games, PharmD, BCPS []  Verlan Friends, PharmD []  Mervyn Gay, PharmD, BCPS []  Vinnie Level, PharmD X   Wilmer Floor, PharmD  [facility] []  Len Childs, PharmD []  Greer Pickerel, PharmD []  Adalberto Cole, PharmD []  Perlie Gold, Rph []  Lonell Face) Jean Rosenthal, PharmD []  Earl Many, PharmD []  Junita Push, PharmD []  Dorna Leitz, PharmD []  Terrilee Files, PharmD []  Lynann Beaver, PharmD []  Keturah Barre, PharmD []  Loralee Pacas, PharmD []  Bernadene Person, PharmD   Positive urine culture Treated with Fluconazole, organism sensitive to the same and no further patient follow-up is required at this time.  Chart reviewed by Dr Anders Simmonds, DO  "No Changes"  Arvid Right 08/15/2023, 8:43 AM

## 2023-08-17 ENCOUNTER — Ambulatory Visit: Payer: Medicare Other | Admitting: Hematology

## 2023-08-17 ENCOUNTER — Other Ambulatory Visit: Payer: Medicare Other

## 2023-08-17 ENCOUNTER — Inpatient Hospital Stay: Payer: Medicare Other

## 2023-08-17 ENCOUNTER — Inpatient Hospital Stay (HOSPITAL_BASED_OUTPATIENT_CLINIC_OR_DEPARTMENT_OTHER): Payer: Medicare Other | Admitting: Hematology

## 2023-08-17 ENCOUNTER — Encounter: Payer: Self-pay | Admitting: Hematology

## 2023-08-17 VITALS — BP 121/73 | HR 64 | Temp 97.1°F | Resp 17 | Ht 63.0 in | Wt 137.7 lb

## 2023-08-17 DIAGNOSIS — C9000 Multiple myeloma not having achieved remission: Secondary | ICD-10-CM

## 2023-08-17 DIAGNOSIS — Z5112 Encounter for antineoplastic immunotherapy: Secondary | ICD-10-CM | POA: Diagnosis not present

## 2023-08-17 DIAGNOSIS — D509 Iron deficiency anemia, unspecified: Secondary | ICD-10-CM

## 2023-08-17 MED ORDER — BORTEZOMIB CHEMO SQ INJECTION 3.5 MG (2.5MG/ML)
0.7000 mg/m2 | Freq: Once | INTRAMUSCULAR | Status: AC
  Administered 2023-08-17: 1.25 mg via SUBCUTANEOUS
  Filled 2023-08-17: qty 0.5

## 2023-08-17 MED ORDER — DENOSUMAB 120 MG/1.7ML ~~LOC~~ SOLN
120.0000 mg | Freq: Once | SUBCUTANEOUS | Status: AC
  Administered 2023-08-17: 120 mg via SUBCUTANEOUS
  Filled 2023-08-17: qty 1.7

## 2023-08-17 MED ORDER — POTASSIUM CHLORIDE CRYS ER 20 MEQ PO TBCR
40.0000 meq | EXTENDED_RELEASE_TABLET | Freq: Once | ORAL | Status: AC
  Administered 2023-08-17: 40 meq via ORAL
  Filled 2023-08-17: qty 2

## 2023-08-17 MED ORDER — PROCHLORPERAZINE MALEATE 10 MG PO TABS
10.0000 mg | ORAL_TABLET | Freq: Once | ORAL | Status: AC
  Administered 2023-08-17: 10 mg via ORAL
  Filled 2023-08-17: qty 1

## 2023-08-17 NOTE — Patient Instructions (Signed)
 [facility] [place] - A DEPT OF [facility]  Discharge Instructions: Thank you for choosing [facility] to provide your oncology and hematology care.  If you have a lab appointment with the Cancer Center - please note that after April 8th, 2024, all labs will be drawn in the cancer center.  You do not have to check in or register with the main entrance as you have in the past but will complete your check-in in the cancer center.  Wear comfortable clothing and clothing appropriate for easy access to any Portacath or PICC line.   We strive to give you quality time with your provider. You may need to reschedule your appointment if you arrive late (15 or more minutes).  Arriving late affects you and other patients whose appointments are after yours.  Also, if you miss three or more appointments without notifying the office, you may be dismissed from the clinic at the provider's discretion.      For prescription refill requests, have your pharmacy contact our office and allow 72 hours for refills to be completed.    Today you received the following chemotherapy and/or immunotherapy agents velcade and xgeva.       To help prevent nausea and vomiting after your treatment, we encourage you to take your nausea medication as directed.  BELOW ARE SYMPTOMS THAT SHOULD BE REPORTED IMMEDIATELY: *FEVER GREATER THAN 100.4 F (38 C) OR HIGHER *CHILLS OR SWEATING *NAUSEA AND VOMITING THAT IS NOT CONTROLLED WITH YOUR NAUSEA MEDICATION *UNUSUAL SHORTNESS OF BREATH *UNUSUAL BRUISING OR BLEEDING *URINARY PROBLEMS (pain or burning when urinating, or frequent urination) *BOWEL PROBLEMS (unusual diarrhea, constipation, pain near the anus) TENDERNESS IN MOUTH AND THROAT WITH OR WITHOUT PRESENCE OF ULCERS (sore throat, sores in mouth, or a toothache) UNUSUAL RASH, SWELLING OR PAIN  UNUSUAL VAGINAL DISCHARGE OR ITCHING   Items with * indicate a potential emergency and should be  followed up as soon as possible or go to the Emergency Department if any problems should occur.  Please show the CHEMOTHERAPY ALERT CARD or IMMUNOTHERAPY ALERT CARD at check-in to the Emergency Department and triage nurse.  Should you have questions after your visit or need to cancel or reschedule your appointment, please contact [facility] [place] - A DEPT OF [facility] [phone]  and follow the prompts.  Office hours are 8:00 a.m. to 4:30 p.m. Monday - Friday. Please note that voicemails left after 4:00 p.m. may not be returned until the following business day.  We are closed weekends and major holidays. You have access to a nurse at all times for urgent questions. Please call the main number to the clinic [phone] and follow the prompts.  For any non-urgent questions, you may also contact your provider using MyChart. We now offer e-Visits for anyone 53 and older to request care online for non-urgent symptoms. For details visit [email or web address].   Also download the MyChart app! Go to the app store, search "MyChart", open the app, select [facility], and log in with your MyChart username and password.

## 2023-08-17 NOTE — Patient Instructions (Signed)
 [facility] at [facility] Discharge Instructions   You were seen and examined today by Dr. Ellin Saba.  He reviewed the results of your lab work which are normal/stable. Your m-spike has gone up a little bit from 3 months ago. Dr. Kirtland Bouchard would like for you to start back taking Revlimid. This pill will be delivered to you.   We will proceed with your treatment today.   Return as scheduled.    Thank you for choosing [facility] at [facility] to provide your oncology and hematology care.  To afford each patient quality time with our provider, please arrive at least 15 minutes before your scheduled appointment time.   If you have a lab appointment with the Cancer Center please come in thru the Main Entrance and check in at the main information desk.  You need to re-schedule your appointment should you arrive 10 or more minutes late.  We strive to give you quality time with our providers, and arriving late affects you and other patients whose appointments are after yours.  Also, if you no show three or more times for appointments you may be dismissed from the clinic at the providers discretion.     Again, thank you for choosing [facility].  Our hope is that these requests will decrease the amount of time that you wait before being seen by our physicians.       _____________________________________________________________  Should you have questions after your visit to [facility], please contact our office at [phone] and follow the prompts.  Our office hours are 8:00 a.m. and 4:30 p.m. Monday - Friday.  Please note that voicemails left after 4:00 p.m. may not be returned until the following business day.  We are closed weekends and major holidays.  You do have access to a nurse 24-7, just call the main number to the clinic [phone] and do not press any options, hold on the line and a nurse will answer the phone.    For  prescription refill requests, have your pharmacy contact our office and allow 72 hours.    Due to Covid, you will need to wear a mask upon entering the hospital. If you do not have a mask, a mask will be given to you at the Main Entrance upon arrival. For doctor visits, patients may have 1 support person age 33 or older with them. For treatment visits, patients can not have anyone with them due to social distancing guidelines and our immunocompromised population.

## 2023-08-17 NOTE — Progress Notes (Signed)
 Ok for treatment verbal order Dr. Ellin Saba.   Patient tolerated Velcade injection with no complaints voiced.  Lab work reviewed.  See MAR for details.  Injection site clean and dry with no bruising or swelling noted.  Patient stable during and after injection.  Band aid applied.  VSS.  Patient left in satisfactory condition with no s/s of distress noted.

## 2023-08-17 NOTE — Progress Notes (Signed)
 [facility] [address], [place] [address]    Clinic Day:  08/17/2023  Referring physician: Gilmore Laroche, FNP  Patient Care Team: Courtney Laroche, FNP as PCP - General (Family Medicine) Courtney Rogers as Medical Oncologist (Oncology) Courtney Bail, RN as Oncology Nurse Navigator (Oncology) Courtney Bal, DO as Consulting Physician (Internal Medicine) Courtney Rogers, Courtney Pillar, LCSW as [facility] Care Management (Licensed Clinical Social Worker)   ASSESSMENT & PLAN:   Assessment: 1.  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  Increased number of atypical plasma cells present 36% of all cell lines.  Plasma cells are kappa light chain restricted. -Unfortunately her specimen has reached cytogenetics and FISH lab week later due to bad weather and no viable plasma cells. -PET scan on 07/09/2019 showed no findings of active myeloma. -24-hour urine shows total protein 59 mg.  Urine immunofixation shows IgA kappa monoclonal protein.  LDH is 184. -Based on [facility] criteria she has high risk with about 45-50% probability of progression to myeloma in the next 2 years. -CTAP on 01/07/2020 shows acute superior endplate burst fracture of L1.  Mild associated paravertebral soft tissue thickening.  No other bone abnormalities. -Bone density test on 02/07/2020 shows T score -0.5. - Bone marrow biopsy on 07/12/2021: Hypercellular with increased number of atypical plasma cells representing 30% of cells, displaying kappa light chain restriction. - FISH panel: Gain of 1 q. (high risk), T p53 deletion (high risk), monosomy 13 (standard risk) - Cytogenetics: Complex karyotype. - PET scan (06/24/2021): New hypermetabolic bone lesions involving left anterior iliac crest, left mid humerus, right superior pubic ramus, bilateral mid femurs, left calvarium.  Largest lytic lesion  in the left iliac crest measuring 2.9 x 2.3 cm. - 2D echo on 07/29/2021: EF 50-55%.  There is distal septal and inferior apical hypokinesis.  (Carfilzomib based regimen was put on hold due to echo findings) - Dara VRD 6 cycles from 08/10/2021 through 11/30/2021 - Myeloma panel 12/07/2021, DIRA test was negative, M Rogers 0.2 g and light chain ratio normal. - Maintenance Velcade every 2 weeks and Revlimid 3 weeks on 1 week off Started on 12/20/2021    2.  Social/family history: - She uses walker to ambulate since her right knee replacement leading to stiffness.  She lives with older sister and husband who has dementia. - She is independent of ADLs and IADLs.  She also drives     Plan: 1.  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  Revlimid was held few months ago due to severe fatigue and mental status changes and depression. - As she had right loin pain, CTAP was done on 08/12/2023: Small right renal stone with no acute findings.  UA showed 70,000 colonies per mL of lactobacillus species. - Reviewed myeloma labs from 08/10/2023.  M Rogers increased to 0.4 g.  FLC ratio is normal at 0.7.  Kappa light chains at 13.9. - As her M Rogers is gradually worsening, I have recommended that we restart Revlimid 10 mg 3 weeks on/1 week off.  She is agreeable.  I have sent the prescription. - Continue Velcade every 2 weeks.  RTC 8 weeks with repeat myeloma labs 2 weeks prior.    2.  Peripheral neuropathy: - She has left foot toe numbness which is stable.  No pains  reported.   3.  Pulmonary embolism (2015): - Continue Coumadin indefinitely.  No bleeding issues.  4.  ID prophylaxis: - Continue acyclovir twice daily.  5.  Myeloma bone disease: - She does not report any jaw pains.  No recent dental work.  Will restart her back on denosumab.  6.  Right knee pain: - Continue hydrocodone 5/325 mg daily as needed.    No orders of the defined types  were placed in this encounter.    Courtney Rogers,acting as a Neurosurgeon for Courtney Rogers.,have documented all relevant documentation on the behalf of Courtney Rogers,as directed by  Courtney Rogers while in the presence of Courtney Rogers.  I, Courtney Rogers, have reviewed the above documentation for accuracy and completeness, and I agree with the above.    Courtney Rogers   3/27/20251:35 PM  CHIEF COMPLAINT:   Diagnosis: multiple myeloma    Cancer Staging  No matching staging information was found for the patient.    Prior Therapy: Dara VRD 6 cycles completed on 11/30/2021   Current Therapy:  Maintenance Velcade every 2 weeks, and Revlimid 3 weeks on/1 week off    HISTORY OF PRESENT ILLNESS:   Oncology History  IgA myeloma (HCC)  05/06/2021 Initial Diagnosis   IgA myeloma (HCC)   08/10/2021 - 01/19/2022 Chemotherapy   Patient is on Treatment Plan : MYELOMA NEWLY DIAGNOSED TRANSPLANT CANDIDATE DaraVRd (Daratumumab SQ) q21d x 6 Cycles (Induction/Consolidation)     08/10/2021 -  Chemotherapy   Patient is on Treatment Plan : MYELOMA NEWLY DIAGNOSED TRANSPLANT CANDIDATE Velcade D1 and D 15 q 28 days        INTERVAL HISTORY:   Courtney Rogers is a 73 y.o. female presenting to clinic today for follow up of multiple myeloma. She was last seen by me on 06/22/23.  Since her last visit, she presented to the ED on 08/12/23 for vaginal candidiasis, treated with Diflucan.   Today, she states that she is doing well overall. Her appetite level is at 100%. Her energy level is at 75%.   Courtney Rogers reports right sided abdominal pain that has radiated from the epigastric region in a band-like fashion for the past month. Pain is worsened when bending over.   Courtney Rogers notes a resistant UTI and is s/p antibiotics without improvement.   She takes hydrocodone prn for right knee pain and Coumadin and acyclovir as prescribed. She reports numbness and  tingling in the toes and left foot.   Courtney Rogers is willing to restart Revlimid and denies any side effects when previously taking it. She denies any dental issues, dental surgeries, having any other dental work being done, or jaw pain.   PAST MEDICAL HISTORY:   Past Medical History: Past Medical History:  Diagnosis Date   Acute myocardial infarction [facility]) 2009   CAD/no stent medically managed   Anxiety disorder    CAD (coronary artery disease)    Cancer (HCC)    multiple myeloma   Hyperlipidemia    Hypertension    IBS (irritable bowel syndrome)    Non-ulcer dyspepsia 04/30/2009   Qualifier: Diagnosis of  By: Darrick Penna Rogers, Sandi L    Overactive bladder    PE (pulmonary embolism) 10/2012   left lung   Presence of permanent cardiac pacemaker    Sleep apnea     Surgical History: Past Surgical History:  Procedure Laterality Date   ABDOMINAL HYSTERECTOMY     BSO secondary to cyst     CARDIAC  CATHETERIZATION     COLONOSCOPY  12/2009   Dr. Aleene Davidson, propofol, normal. Next TCS 12/2019   COLONOSCOPY N/A 05/22/2017   Examined portion ileum was normal, significant looping of colon, external hemorrhoids and rectal bleeding due to internal hemorrhoids, mild diverticulosis procedure: COLONOSCOPY;  Surgeon: West Bali, Rogers;  [facility];  Service: Endoscopy;  Laterality: N/A;  10:30am   ESOPHAGOGASTRODUODENOSCOPY  05/11/2009   schatzki ring/small hiatal hernia/path:gastritis   ESOPHAGOGASTRODUODENOSCOPY N/A 05/22/2017   Multiple gastric polyps with biopsy benign fundic gland polyp, small bowel biopsy negative for celiac, gastric biopsy with mild gastritis but no H. pylori procedure: ESOPHAGOGASTRODUODENOSCOPY (EGD);  Surgeon: West Bali, Rogers;  [facility]: [facility];  Service: Endoscopy;  Laterality: N/A;   ESOPHAGOGASTRODUODENOSCOPY (EGD) WITH ESOPHAGEAL DILATION N/A 04/01/2013   [id number] at the gastroesophageal juction/multiple small polyps/mild gastritis   GIVENS  CAPSULE STUDY N/A 06/07/2017   Frequent gastric erosions, normal small bowel mucosa.  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    Social History: Social History   Socioeconomic History   Marital status: Married    Spouse name: Doctor, general practice   Number of children: 3   Years of education: 16   Highest education level: Master's degree (e.g., MA, MS, MEng, MEd, MSW, MBA)  Occupational History   Not on file  Tobacco Use   Smoking status: Never    Passive exposure: Never   Smokeless tobacco: Never   Tobacco comments:    Never smoked   Vaping Use   Vaping status: Never Used  Substance and Sexual Activity   Alcohol use: Not Currently    Comment: occ wine   Drug use: No   Sexual activity: Not Currently  Other Topics Concern   Not on file  Social History Narrative      Lives with husband-51 years 950 in Aug 2021   Daughter is close by, 2 sons live further away   One IN [place],ONE IN [place], ONE BESIDE HER.        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   Enjoys: reading, young adult      Diet: eats all food groups    Caffeine: coffee 1, tea daily soda-1 daily   Water: 1-2 cups      Wears seat belt    Does not use phone while driving   Smoke detectors at home    Licensed conveyancer -locked up      Left handed   One story home   Drinks caffeine   Social Drivers of Health   Financial Resource Strain: Low Risk  (06/26/2023)   Overall Financial Resource Strain (CARDIA)    Difficulty of Paying Living Expenses: Not hard at all  Food Insecurity: No Food Insecurity (06/26/2023)   Hunger Vital Sign    Worried About Running Out of Food in the Last Year: Never true    Ran Out of Food in the Last Year: Never true  Transportation Needs: No Transportation  Needs (06/26/2023)   PRAPARE - Administrator, Civil Service (Medical): No    Lack of Transportation (Non-Medical): No  Physical Activity: Insufficiently Active (06/26/2023)   Exercise Vital Sign    Days of Exercise per Week: 2 days  Minutes of Exercise per Session: 20 min  Stress: Stress Concern Present (06/26/2023)   [employer] of Occupational Health - Occupational Stress Questionnaire    Feeling of Stress : To some extent  Social Connections: Socially Integrated (06/26/2023)   Social Connection and Isolation Panel [NHANES]    Frequency of Communication with Friends and Family: Twice a week    Frequency of Social Gatherings with Friends and Family: Once a week    Attends Religious Services: 1 to 4 times per year    Active Member of [employer] or Organizations: Yes    Attends Banker Meetings: 1 to 4 times per year    Marital Status: Married  Catering manager Violence: Not At Risk (12/21/2022)   Humiliation, Afraid, Rape, and Kick questionnaire    Fear of Current or Ex-Partner: No    Emotionally Abused: No    Physically Abused: No    Sexually Abused: No    Family History: Family History  Problem Relation Age of Onset   Colon polyps Neg Hx    Colon cancer Neg Hx     Current Medications:  Current Outpatient Medications:    acetaminophen (TYLENOL) 500 MG tablet, Take 1,000 mg by mouth 2 (two) times daily as needed for moderate pain or headache., Disp: , Rfl:    acyclovir (ZOVIRAX) 400 MG tablet, TAKE ONE TABLET BY MOUTH TWICE DAILY, Disp: 60 tablet, Rfl: 6   amLODipine (NORVASC) 10 MG tablet, TAKE ONE TABLET BY MOUTH ONCE DAILY, Disp: 90 tablet, Rfl: 4   ascorbic acid (VITAMIN C) 500 MG tablet, Take 500 mg by mouth daily., Disp: , Rfl:    aspirin EC 81 MG tablet, Take 81 mg by mouth at bedtime., Disp: , Rfl:    B Complex Vitamins (VITAMIN B COMPLEX) TABS, Take 1 tablet by mouth daily., Disp: , Rfl:    B Complex-Folic Acid (B COMPLEX VITAMINS, W/ FA,) CAPS,  Take 1 capsule by mouth daily., Disp: , Rfl:    bortezomib SQ (VELCADE) 3.5 MG injection, Inject into the skin once., Disp: , Rfl:    busPIRone (BUSPAR) 5 MG tablet, Take 5 mg by mouth daily., Disp: , Rfl:    Calcium Carbonate-Vitamin D (CALCIUM 600+D PO), Take 1 tablet by mouth 2 (two) times daily., Disp: , Rfl:    chlorthalidone (HYGROTON) 25 MG tablet, Take 25 mg by mouth daily., Disp: , Rfl:    cholecalciferol (VITAMIN D3) 10 MCG (400 UNIT) TABS tablet, Take 400 Units by mouth daily., Disp: , Rfl:    clindamycin (CLEOCIN) 150 MG capsule, Take 1 capsule (150 mg total) by mouth 3 (three) times daily., Disp: 21 capsule, Rfl: 0   cyanocobalamin (VITAMIN B12) 1000 MCG tablet, Take by mouth., Disp: , Rfl:    diclofenac Sodium (VOLTAREN) 1 % GEL, Apply 4 g topically 4 (four) times daily., Disp: 50 g, Rfl: 0   FEROSUL 325 (65 Fe) MG tablet, Take 325 mg by mouth daily., Disp: , Rfl:    fluconazole (DIFLUCAN) 150 MG tablet, Take 150 mg by mouth daily., Disp: , Rfl:    fluticasone (FLONASE) 50 MCG/ACT nasal spray, USE 2 SPRAYS IN EACH NOSTRIL ONCE DAILY, Disp: 16 g, Rfl: 0   fluticasone-salmeterol (ADVAIR) 250-50 MCG/ACT AEPB, Inhale into the lungs., Disp: , Rfl:    folic acid (FOLVITE) 1 MG tablet, TAKE ONE TABLET BY MOUTH EVERY DAY, Disp: 30 tablet, Rfl: 11   gabapentin (NEURONTIN) 100 MG capsule, Take 100 mg by mouth 3 (three) times daily.,  Disp: , Rfl:    HYDROcodone-acetaminophen (NORCO/VICODIN) 5-325 MG tablet, TAKE ONE TABLET BY MOUTH EVERY 8 HOURS AS NEEDED FOR MODERATE PAIN, Disp: 60 tablet, Rfl: 0   hydrOXYzine (VISTARIL) 25 MG capsule, Take 1 capsule (25 mg total) by mouth at bedtime as needed., Disp: 30 capsule, Rfl: 0   Hyoscyamine Sulfate SL 0.125 MG SUBL, , Disp: , Rfl:    lenalidomide (REVLIMID) 10 MG capsule, Take 1 capsule (10 mg total) by mouth daily. 21 days on, 7 days off every 28 days, Disp: 21 capsule, Rfl: 0   levocetirizine (XYZAL) 5 MG tablet, TAKE ONE TABLET BY MOUTH IN THE  EVENING, Disp: 30 tablet, Rfl: 3   lidocaine (HM LIDOCAINE PATCH) 4 %, Place 1 patch onto the skin daily., Disp: 14 patch, Rfl: 0   losartan (COZAAR) 100 MG tablet, TAKE ONE TABLET BY MOUTH ONCE DAILY, Disp: 30 tablet, Rfl: 3   magnesium oxide (MAG-OX) 400 (240 Mg) MG tablet, TAKE ONE TABLET BY MOUTH TWICE DAILY, Disp: 60 tablet, Rfl: 4   metoprolol tartrate (LOPRESSOR) 50 MG tablet, Take 1 tablet (50 mg total) by mouth 2 (two) times daily., Disp: 180 tablet, Rfl: 1   montelukast (SINGULAIR) 10 MG tablet, TAKE ONE TABLET BY MOUTH AT BEDTIME, Disp: 30 tablet, Rfl: 3   MYRBETRIQ 50 MG TB24 tablet, TAKE ONE TABLET BY MOUTH ONCE DAILY, Disp: 30 tablet, Rfl: 3   nitroGLYCERIN (NITROSTAT) 0.4 MG SL tablet, Place 0.4 mg under the tongue every 5 (five) minutes as needed for chest pain., Disp: , Rfl:    pantoprazole (PROTONIX) 40 MG tablet, TAKE ONE TABLET BY MOUTH TWICE DAILY, Disp: 90 tablet, Rfl: 3   potassium chloride SA (KLOR-CON M) 20 MEQ tablet, Take 1 tablet (20 mEq total) by mouth 2 (two) times daily., Disp: 6 tablet, Rfl: 0   predniSONE (DELTASONE) 20 MG tablet, Take 2 tablets (40 mg total) by mouth daily with breakfast., Disp: 10 tablet, Rfl: 0   primidone (MYSOLINE) 50 MG tablet, TAKE ONE TABLET BY MOUTH TWICE DAILY MORNING AND BEDTIME, Disp: 60 tablet, Rfl: 5   Probiotic Product (GNP PROBIOTIC COLON SUPPORT) CAPS, Take 1 capsule by mouth daily., Disp: , Rfl:    Saccharomyces boulardii (PROBIOTIC) 250 MG CAPS, Take 1 capsule by mouth daily., Disp: , Rfl:    sertraline (ZOLOFT) 25 MG tablet, TAKE ONE TABLET BY MOUTH ONCE DAILY, Disp: 30 tablet, Rfl: 1   simvastatin (ZOCOR) 40 MG tablet, TAKE ONE TABLET BY MOUTH AT BEDTIME, Disp: 90 tablet, Rfl: 1   sulfamethoxazole-trimethoprim (BACTRIM DS) 800-160 MG tablet, Take 1 tablet by mouth 2 (two) times daily., Disp: 6 tablet, Rfl: 0   tadalafil, PAH, (ALYQ) 20 MG tablet, Take 2 tablets every day by oral route., Disp: , Rfl:    terbinafine (LAMISIL) 250  MG tablet, Take 1 tablet (250 mg total) by mouth daily., Disp: 14 tablet, Rfl: 0   topiramate (TOPAMAX) 25 MG tablet, Take 25 mg by mouth 2 (two) times daily., Disp: , Rfl:    warfarin (COUMADIN) 6 MG tablet, Take 6 mg by mouth at bedtime., Disp: , Rfl:  No current facility-administered medications for this visit.  Facility-Administered Medications Ordered in Other Visits:    bortezomib SQ (VELCADE) chemo injection (2.5mg /mL concentration) 1.25 mg, 0.7 mg/m2 (Treatment Plan Recorded), Subcutaneous, Once, Courtney Rogers   denosumab (XGEVA) injection 120 mg, 120 mg, Subcutaneous, Once, Courtney Rogers   Allergies: Allergies  Allergen Reactions   Amoxicillin-Pot Clavulanate Other (See Comments)  Clarithromycin Other (See Comments)    Stomach problems   Erythromycin    Lisinopril Swelling   Amoxicillin Diarrhea    REVIEW OF SYSTEMS:   Review of Systems  Constitutional:  Negative for chills, fatigue and fever.  HENT:   Negative for lump/mass, mouth sores, nosebleeds, sore throat and trouble swallowing.   Eyes:  Negative for eye problems.  Respiratory:  Negative for cough and shortness of breath.   Cardiovascular:  Negative for chest pain, leg swelling and palpitations.  Gastrointestinal:  Positive for abdominal pain (right sided). Negative for constipation, diarrhea, nausea and vomiting.  Genitourinary:  Negative for bladder incontinence, difficulty urinating, dysuria, frequency, hematuria and nocturia.   Musculoskeletal:  Negative for arthralgias, back pain, flank pain, myalgias and neck pain.       +bilateral arm pain, 5/10 severity  Skin:  Negative for itching and rash.  Neurological:  Positive for numbness (in toes). Negative for dizziness and headaches.  Hematological:  Does not bruise/bleed easily.  Psychiatric/Behavioral:  Negative for depression, sleep disturbance and suicidal ideas. The patient is nervous/anxious.   All other systems reviewed and are  negative.    VITALS:   Blood pressure 121/73, pulse 64, temperature (!) 97.1 F (36.2 C), temperature source Tympanic, resp. rate 17, height 5\' 3"  (1.6 m), weight 137 lb 11.2 oz (62.5 kg), SpO2 100%.  Wt Readings from Last 3 Encounters:  08/17/23 137 lb 11.2 oz (62.5 kg)  08/12/23 140 lb (63.5 kg)  08/03/23 142 lb (64.4 kg)    Body mass index is 24.39 kg/m.  Performance status (ECOG): 1 - Symptomatic but completely ambulatory  PHYSICAL EXAM:   Physical Exam Vitals and nursing note reviewed. Exam conducted with a chaperone present.  Constitutional:      Appearance: Normal appearance.  Cardiovascular:     Rate and Rhythm: Normal rate and regular rhythm.     Pulses: Normal pulses.     Heart sounds: Normal heart sounds.  Pulmonary:     Effort: Pulmonary effort is normal.     Breath sounds: Normal breath sounds.  Abdominal:     Palpations: Abdomen is soft. There is no hepatomegaly, splenomegaly or mass.     Tenderness: There is no abdominal tenderness.  Musculoskeletal:     Right lower leg: No edema.     Left lower leg: No edema.  Lymphadenopathy:     Cervical: No cervical adenopathy.     Right cervical: No superficial, deep or posterior cervical adenopathy.    Left cervical: No superficial, deep or posterior cervical adenopathy.     Upper Body:     Right upper body: No supraclavicular or axillary adenopathy.     Left upper body: No supraclavicular or axillary adenopathy.  Neurological:     General: No focal deficit present.     Mental Status: She is alert and oriented to person, place, and time.  Psychiatric:        Mood and Affect: Mood normal.        Behavior: Behavior normal.     LABS:      Latest Ref Rng & Units 08/12/2023   10:14 AM 08/10/2023   11:00 AM 08/03/2023   11:49 AM  CBC  WBC 4.0 - 10.5 K/uL 3.1  3.9  4.0   Hemoglobin 12.0 - 15.0 g/dL 16.1  09.6  04.5   Hematocrit 36.0 - 46.0 % 34.0  35.1  34.4   Platelets 150 - 400 K/uL 112  117  126  Latest Ref Rng & Units 08/12/2023   10:14 AM 08/10/2023   11:00 AM 08/03/2023   11:49 AM  CMP  Glucose 70 - 99 mg/dL 92  80  77   BUN 8 - 23 mg/dL 11  13  14    Creatinine 0.44 - 1.00 mg/dL 1.61  0.96  0.45   Sodium 135 - 145 mmol/L 131  131  132   Potassium 3.5 - 5.1 mmol/L 2.9  3.5  4.1   Chloride 98 - 111 mmol/L 94  97  98   CO2 22 - 32 mmol/L 24  24  25    Calcium 8.9 - 10.3 mg/dL 8.7  8.8  9.0   Total Protein 6.5 - 8.1 g/dL 6.7  7.0  6.5   Total Bilirubin 0.0 - 1.2 mg/dL 0.6  0.6  0.5   Alkaline Phos 38 - 126 U/L 37  39  47   AST 15 - 41 U/L 25  27  25    ALT 0 - 44 U/L 20  21  24       No results found for: "CEA1", "CEA" / No results found for: "CEA1", "CEA" No results found for: "PSA1" No results found for: "CAN199" No results found for: "CAN125"  Lab Results  Component Value Date   TOTALPROTELP 6.5 08/10/2023   ALBUMINELP 3.8 08/10/2023   A1GS 0.3 08/10/2023   A2GS 0.6 08/10/2023   BETS 1.0 08/10/2023   GAMS 0.8 08/10/2023   MSPIKE 0.4 (H) 08/10/2023   SPEI Comment 08/10/2023   Lab Results  Component Value Date   TIBC 291 06/09/2021   TIBC 297 01/21/2021   TIBC 302 10/21/2020   FERRITIN 279 06/09/2021   FERRITIN 216 01/21/2021   FERRITIN 274 10/21/2020   IRONPCTSAT 24 06/09/2021   IRONPCTSAT 19 01/21/2021   IRONPCTSAT 28 10/21/2020   Lab Results  Component Value Date   LDH 129 03/02/2022   LDH 121 12/14/2021   LDH 113 11/09/2021     STUDIES:   CT ABDOMEN PELVIS WO CONTRAST Result Date: 08/12/2023 CLINICAL DATA:  Right flank pain and urinary frequency. EXAM: CT ABDOMEN AND PELVIS WITHOUT CONTRAST TECHNIQUE: Multidetector CT imaging of the abdomen and pelvis was performed following the standard protocol without IV contrast. RADIATION DOSE REDUCTION: This exam was performed according to the departmental dose-optimization program which includes automated exposure control, adjustment of the mA and/or kV according to patient size and/or use of iterative  reconstruction technique. COMPARISON:  02/12/2023. FINDINGS: Lower chest: Basilar bronchiectasis, calcification and scarring in the lung bases. Heart is at the upper limits of normal in size. No pericardial or pleural effusion. Distal esophagus is grossly unremarkable. Hepatobiliary: Liver and gallbladder are unremarkable. No biliary ductal dilatation. Pancreas: Negative. Spleen: Negative. Adrenals/Urinary Tract: Adrenal glands are unremarkable. Small stone in the lower pole right kidney. Low-attenuation lesion in the right kidney. No specific follow-up necessary. Kidneys are otherwise unremarkable. Ureters are decompressed. Bladder is grossly unremarkable. Stomach/Bowel: Stomach, small bowel and colon are unremarkable. Appendix is not well-visualized. Vascular/Lymphatic: Vascular structures are unremarkable. No pathologically enlarged lymph nodes. Reproductive: Hysterectomy.  No adnexal mass. Other: No free fluid.  Mesenteries and peritoneum are unremarkable. Musculoskeletal: Osteopenia. Degenerative changes in the spine. L1 vertebral body augmentation. Minimal grade 1 anterolisthesis of L3 on L4. IMPRESSION: 1. No acute findings to explain the patient's clinical history. 2. Small right renal stone. Electronically Signed   By: Leanna Battles M.D.   On: 08/12/2023 11:13   MM 3D SCREENING MAMMOGRAM BILATERAL BREAST  Result Date: 08/03/2023 CLINICAL DATA:  Screening. EXAM: DIGITAL SCREENING BILATERAL MAMMOGRAM WITH TOMOSYNTHESIS AND CAD TECHNIQUE: Bilateral screening digital craniocaudal and mediolateral oblique mammograms were obtained. Bilateral screening digital breast tomosynthesis was performed. The images were evaluated with computer-aided detection. COMPARISON:  Previous exam(s). ACR Breast Density Category b: There are scattered areas of fibroglandular density. FINDINGS: There are no findings suspicious for malignancy. IMPRESSION: No mammographic evidence of malignancy. A result letter of this screening  mammogram will be mailed directly to the patient. RECOMMENDATION: Screening mammogram in one year. (Code:SM-B-01Y) BI-RADS CATEGORY  1: Negative. Electronically Signed   By: Frederico Hamman M.D.   On: 08/03/2023 08:37

## 2023-08-17 NOTE — Progress Notes (Signed)
 Patient has been examined by Dr. Ellin Saba. Vital signs and labs have been reviewed by MD - ANC, Creatinine, LFTs, hemoglobin, and platelets are within treatment parameters per M.D. - pt may proceed with treatment.  Primary RN and pharmacy notified.

## 2023-08-18 ENCOUNTER — Other Ambulatory Visit: Payer: Self-pay | Admitting: Hematology

## 2023-08-18 ENCOUNTER — Other Ambulatory Visit: Payer: Self-pay | Admitting: Family Medicine

## 2023-08-18 DIAGNOSIS — I10 Essential (primary) hypertension: Secondary | ICD-10-CM

## 2023-08-18 DIAGNOSIS — J301 Allergic rhinitis due to pollen: Secondary | ICD-10-CM

## 2023-08-18 MED ORDER — LENALIDOMIDE 10 MG PO CAPS: ORAL_CAPSULE | ORAL | 0 refills | Status: DC

## 2023-08-18 NOTE — Telephone Encounter (Signed)
 Chart reviewed. Revlimid refilled per last office note with Dr. Ellin Saba.

## 2023-08-21 ENCOUNTER — Emergency Department (HOSPITAL_COMMUNITY)
Admission: EM | Admit: 2023-08-21 | Discharge: 2023-08-21 | Disposition: A | Discharge status: Home / Self Care | Attending: Emergency Medicine | Admitting: Emergency Medicine

## 2023-08-21 ENCOUNTER — Other Ambulatory Visit: Payer: Self-pay

## 2023-08-21 ENCOUNTER — Encounter (HOSPITAL_COMMUNITY): Payer: Self-pay

## 2023-08-21 DIAGNOSIS — I2510 Atherosclerotic heart disease of native coronary artery without angina pectoris: Secondary | ICD-10-CM | POA: Diagnosis not present

## 2023-08-21 DIAGNOSIS — I10 Essential (primary) hypertension: Secondary | ICD-10-CM | POA: Insufficient documentation

## 2023-08-21 DIAGNOSIS — Z79899 Other long term (current) drug therapy: Secondary | ICD-10-CM | POA: Insufficient documentation

## 2023-08-21 DIAGNOSIS — E876 Hypokalemia: Secondary | ICD-10-CM | POA: Diagnosis not present

## 2023-08-21 DIAGNOSIS — Z8579 Personal history of other malignant neoplasms of lymphoid, hematopoietic and related tissues: Secondary | ICD-10-CM | POA: Diagnosis not present

## 2023-08-21 DIAGNOSIS — R109 Unspecified abdominal pain: Secondary | ICD-10-CM | POA: Diagnosis present

## 2023-08-21 DIAGNOSIS — Z7901 Long term (current) use of anticoagulants: Secondary | ICD-10-CM | POA: Diagnosis not present

## 2023-08-21 LAB — CBC WITH DIFFERENTIAL/PLATELET
Abs Immature Granulocytes: 0.01 10*3/uL (ref 0.00–0.07)
Basophils Absolute: 0 10*3/uL (ref 0.0–0.1)
Basophils Relative: 0 %
Eosinophils Absolute: 0.1 10*3/uL (ref 0.0–0.5)
Eosinophils Relative: 1 %
HCT: 37.2 % (ref 36.0–46.0)
Hemoglobin: 12.7 g/dL (ref 12.0–15.0)
Immature Granulocytes: 0 %
Lymphocytes Relative: 42 %
Lymphs Abs: 1.5 10*3/uL (ref 0.7–4.0)
MCH: 35.9 pg — ABNORMAL HIGH (ref 26.0–34.0)
MCHC: 34.1 g/dL (ref 30.0–36.0)
MCV: 105.1 fL — ABNORMAL HIGH (ref 80.0–100.0)
Monocytes Absolute: 0.5 10*3/uL (ref 0.1–1.0)
Monocytes Relative: 13 %
Neutro Abs: 1.6 10*3/uL — ABNORMAL LOW (ref 1.7–7.7)
Neutrophils Relative %: 44 %
Platelets: 137 10*3/uL — ABNORMAL LOW (ref 150–400)
RBC: 3.54 MIL/uL — ABNORMAL LOW (ref 3.87–5.11)
RDW: 13.6 % (ref 11.5–15.5)
WBC: 3.7 10*3/uL — ABNORMAL LOW (ref 4.0–10.5)
nRBC: 0 % (ref 0.0–0.2)

## 2023-08-21 LAB — URINALYSIS, ROUTINE W REFLEX MICROSCOPIC
Bilirubin Urine: NEGATIVE
Glucose, UA: NEGATIVE mg/dL
Hgb urine dipstick: NEGATIVE
Ketones, ur: NEGATIVE mg/dL
Nitrite: NEGATIVE
Protein, ur: NEGATIVE mg/dL
Specific Gravity, Urine: 1.018 (ref 1.005–1.030)
pH: 6 (ref 5.0–8.0)

## 2023-08-21 LAB — COMPREHENSIVE METABOLIC PANEL WITH GFR
ALT: 16 U/L (ref 0–44)
AST: 23 U/L (ref 15–41)
Albumin: 4.2 g/dL (ref 3.5–5.0)
Alkaline Phosphatase: 42 U/L (ref 38–126)
Anion gap: 11 (ref 5–15)
BUN: 17 mg/dL (ref 8–23)
CO2: 26 mmol/L (ref 22–32)
Calcium: 9.2 mg/dL (ref 8.9–10.3)
Chloride: 98 mmol/L (ref 98–111)
Creatinine, Ser: 0.91 mg/dL (ref 0.44–1.00)
GFR, Estimated: >60 mL/min (ref >60)
Glucose, Bld: 98 mg/dL (ref 70–99)
Potassium: 3 mmol/L — ABNORMAL LOW (ref 3.5–5.1)
Sodium: 135 mmol/L (ref 135–145)
Total Bilirubin: 0.6 mg/dL (ref 0.0–1.2)
Total Protein: 7.3 g/dL (ref 6.5–8.1)

## 2023-08-21 LAB — WET PREP, GENITAL
Clue Cells Wet Prep HPF POC: NONE SEEN
Sperm: NONE SEEN
Trich, Wet Prep: NONE SEEN
WBC, Wet Prep HPF POC: <10 (ref <10)
Yeast Wet Prep HPF POC: NONE SEEN

## 2023-08-21 MED ORDER — LIDOCAINE 5 % EX PTCH
1.0000 | MEDICATED_PATCH | CUTANEOUS | Status: DC
  Administered 2023-08-21: 1 via TRANSDERMAL
  Filled 2023-08-21: qty 1

## 2023-08-21 MED ORDER — POTASSIUM CHLORIDE CRYS ER 20 MEQ PO TBCR
40.0000 meq | EXTENDED_RELEASE_TABLET | Freq: Once | ORAL | Status: AC
  Administered 2023-08-21: 40 meq via ORAL
  Filled 2023-08-21: qty 2

## 2023-08-21 NOTE — ED Triage Notes (Signed)
 Pt arrived via POV c/o right flank pain X 1 month. Pt seen here recently for same complaint. Pt reports being back and forth with her doctors and has been told she has had a yeast infection and an UTI. Pt reports symptoms have not improved.

## 2023-08-21 NOTE — Discharge Instructions (Addendum)
 Keep your appointment with your primary doctor tomorrow.  Your lab tests and exam today are reassuring, although your potassium is still low at 3.0.  You have been given a potassium tablet here, please continue your other home potassium prescription as well.  If this lidocaine patch helps relieve your symptoms you may choose to use your Salonpas to replace this patch which can stay on for 12 hours.

## 2023-08-22 ENCOUNTER — Ambulatory Visit (INDEPENDENT_AMBULATORY_CARE_PROVIDER_SITE_OTHER): Payer: Self-pay

## 2023-08-22 VITALS — BP 121/76 | HR 89 | Ht 63.0 in | Wt 134.8 lb

## 2023-08-22 DIAGNOSIS — G8929 Other chronic pain: Secondary | ICD-10-CM

## 2023-08-22 DIAGNOSIS — M25561 Pain in right knee: Secondary | ICD-10-CM

## 2023-08-22 DIAGNOSIS — N390 Urinary tract infection, site not specified: Secondary | ICD-10-CM | POA: Diagnosis not present

## 2023-08-22 DIAGNOSIS — N200 Calculus of kidney: Secondary | ICD-10-CM

## 2023-08-22 MED ORDER — TRAMADOL HCL 50 MG PO TABS: 50.0000 mg | ORAL_TABLET | Freq: Three times a day (TID) | ORAL | 0 refills | Status: AC | PRN

## 2023-08-22 NOTE — Progress Notes (Unsigned)
   Established Patient Office Visit  Subjective   Patient ID: Courtney Rogers, female    DOB: 1951-03-21  Age: 73 y.o. MRN: [id number]  Chief Complaint  Patient presents with   Follow-up    Pt. States she stayed there all night last night and states they didn't find anything and that her right side still hurts, Pt also states her left shoulder hurts    HPI  {History (Optional):23778}  ROS    Objective:     BP 121/76 (BP Location: Right Arm, Patient Position: Sitting, Cuff Size: Normal)   Pulse 89   Ht 5\' 3"  (1.6 m)   Wt 134 lb 12.8 oz (61.1 kg)   SpO2 96%   BMI 23.88 kg/m  {Vitals History (Optional):23777}  Physical Exam   No results found for any visits on 08/22/23.  {Labs (Optional):23779}  The ASCVD Risk score (Arnett DK, et al., 2019) failed to calculate for the following reasons:   Risk score cannot be calculated because patient has a medical history suggesting prior/existing ASCVD    Assessment & Plan:   Problem List Items Addressed This Visit       Other   Right knee pain   Relevant Medications   traMADol (ULTRAM) 50 MG tablet   Other Visit Diagnoses       Frequent urinary tract infections    -  Primary   Relevant Orders   Ambulatory referral to Urology     Right kidney stone       Relevant Medications   traMADol (ULTRAM) 50 MG tablet   Other Relevant Orders   Ambulatory referral to Urology       No follow-ups on file.    Darral Dash, FNP

## 2023-08-22 NOTE — Patient Instructions (Signed)
 Tramadol sent to pharmacy for knee pain.  Recommend taking some Tylenol when you take this for added pain relief.    Referral placed to urology for further evaluation of ongoing right sided pain and recurrent urinary tract infections.

## 2023-08-23 DIAGNOSIS — N390 Urinary tract infection, site not specified: Secondary | ICD-10-CM | POA: Insufficient documentation

## 2023-08-23 DIAGNOSIS — N200 Calculus of kidney: Secondary | ICD-10-CM | POA: Insufficient documentation

## 2023-08-23 NOTE — Assessment & Plan Note (Signed)
 Acute on chronic exacerbation.  Agree to refill tramadol for pain relief.  Recommend also taking Tylenol with this for added pain relief.  Recommend cool compress as needed for swelling.

## 2023-08-23 NOTE — Assessment & Plan Note (Signed)
 She has been seen multiple times in ER and office setting for this over the past 6 months.  Continues to complain of rt flank pain as well.  Will refer to urology for further evaluation and treatment.  Recommend adding daily OTC D-mannose supplement for UTI prevention.

## 2023-08-23 NOTE — Assessment & Plan Note (Signed)
 Seen on recent imaging studies, although not felt to be cause of her right flank pain.  Will refer to urology for further evaluation.

## 2023-08-25 ENCOUNTER — Inpatient Hospital Stay

## 2023-08-28 NOTE — Progress Notes (Unsigned)
 GI Office Note    Referring Provider: Gilmore Laroche, FNP Primary Care Physician:  Gilmore Laroche, FNP  Primary Gastroenterologist: Hennie Duos. Marletta Lor, DO   Chief Complaint   No chief complaint on file.   History of Present Illness   Courtney Rogers is a 73 y.o. female presenting today for follow up IBS, GERD. Last seen 9.2023.   CT A/P without contrast 07/2023:  IMPRESSION: 1. No acute findings to explain the patient's clinical history. 2. Small right renal stone.    EGD December 2018: -Multiple gastric polyps. Resected and retrieved.  Biopsy: Benign fundic gland polyp. -NO OBVIOUS SOURCE FOR NORMOCYTIC ANEMIA IDENTIFIED. -Small bowel biopsy negative for celiac, gastric biopsy with mild gastritis, no H. pylori.   Colonoscopy December 2018: -The examined portion of the ileum was normal. - There was significant looping of the colon. - External HEMORRHOIDS and RECTAL BLEEDING DUE TO internal hemorrhoids. - MILD Diverticulosis in the recto-sigmoid colon and in the sigmoid colon. -No repeat screening colonoscopy due to age   Small bowel capsule endoscopy January 2019: -Frequent gastric erosions -Normal small bowel mucosa     Medications   Current Outpatient Medications  Medication Sig Dispense Refill   acetaminophen (TYLENOL) 500 MG tablet Take 1,000 mg by mouth 2 (two) times daily as needed for moderate pain or headache.     acyclovir (ZOVIRAX) 400 MG tablet TAKE ONE TABLET BY MOUTH TWICE DAILY 60 tablet 6   amLODipine (NORVASC) 10 MG tablet TAKE ONE TABLET BY MOUTH ONCE DAILY 90 tablet 4   ascorbic acid (VITAMIN C) 500 MG tablet Take 500 mg by mouth daily.     aspirin EC 81 MG tablet Take 81 mg by mouth at bedtime.     B Complex Vitamins (VITAMIN B COMPLEX) TABS Take 1 tablet by mouth daily.     B Complex-Folic Acid (B COMPLEX VITAMINS, W/ FA,) CAPS Take 1 capsule by mouth daily.     bortezomib SQ (VELCADE) 3.5 MG injection Inject into the skin once.      busPIRone (BUSPAR) 5 MG tablet Take 5 mg by mouth daily.     Calcium Carbonate-Vitamin D (CALCIUM 600+D PO) Take 1 tablet by mouth 2 (two) times daily.     chlorthalidone (HYGROTON) 25 MG tablet Take 25 mg by mouth daily.     cholecalciferol (VITAMIN D3) 10 MCG (400 UNIT) TABS tablet Take 400 Units by mouth daily.     clindamycin (CLEOCIN) 150 MG capsule Take 1 capsule (150 mg total) by mouth 3 (three) times daily. 21 capsule 0   cyanocobalamin (VITAMIN B12) 1000 MCG tablet Take by mouth.     diclofenac Sodium (VOLTAREN) 1 % GEL Apply 4 g topically 4 (four) times daily. 50 g 0   FEROSUL 325 (65 Fe) MG tablet Take 325 mg by mouth daily.     fluconazole (DIFLUCAN) 150 MG tablet Take 150 mg by mouth daily.     fluticasone (FLONASE) 50 MCG/ACT nasal spray USE 2 SPRAYS IN EACH NOSTRIL ONCE DAILY 16 g 0   fluticasone-salmeterol (ADVAIR) 250-50 MCG/ACT AEPB Inhale into the lungs.     folic acid (FOLVITE) 1 MG tablet TAKE ONE TABLET BY MOUTH EVERY DAY 30 tablet 11   gabapentin (NEURONTIN) 100 MG capsule Take 100 mg by mouth 3 (three) times daily.     hydrOXYzine (VISTARIL) 25 MG capsule Take 1 capsule (25 mg total) by mouth at bedtime as needed. 30 capsule 0   Hyoscyamine Sulfate SL  0.125 MG SUBL      lenalidomide (REVLIMID) 10 MG capsule Take 1 capsule (10 mg total) by mouth daily. 21 days on, 7 days off every 28 days 21 capsule 0   levocetirizine (XYZAL) 5 MG tablet TAKE ONE TABLET BY MOUTH IN THE EVENING 30 tablet 3   lidocaine (HM LIDOCAINE PATCH) 4 % Place 1 patch onto the skin daily. 14 patch 0   losartan (COZAAR) 100 MG tablet TAKE ONE TABLET BY MOUTH ONCE DAILY 30 tablet 3   magnesium oxide (MAG-OX) 400 (240 Mg) MG tablet TAKE ONE TABLET BY MOUTH TWICE DAILY 60 tablet 4   metoprolol tartrate (LOPRESSOR) 50 MG tablet Take 1 tablet (50 mg total) by mouth 2 (two) times daily. 180 tablet 1   montelukast (SINGULAIR) 10 MG tablet TAKE ONE TABLET BY MOUTH AT BEDTIME 30 tablet 3   MYRBETRIQ 50 MG TB24  tablet TAKE ONE TABLET BY MOUTH ONCE DAILY 30 tablet 3   nitroGLYCERIN (NITROSTAT) 0.4 MG SL tablet Place 0.4 mg under the tongue every 5 (five) minutes as needed for chest pain.     pantoprazole (PROTONIX) 40 MG tablet TAKE ONE TABLET BY MOUTH TWICE DAILY 90 tablet 3   potassium chloride SA (KLOR-CON M) 20 MEQ tablet Take 1 tablet (20 mEq total) by mouth 2 (two) times daily. 6 tablet 0   predniSONE (DELTASONE) 20 MG tablet Take 2 tablets (40 mg total) by mouth daily with breakfast. 10 tablet 0   primidone (MYSOLINE) 50 MG tablet TAKE ONE TABLET BY MOUTH TWICE DAILY MORNING AND BEDTIME 60 tablet 5   Probiotic Product (GNP PROBIOTIC COLON SUPPORT) CAPS Take 1 capsule by mouth daily.     Saccharomyces boulardii (PROBIOTIC) 250 MG CAPS Take 1 capsule by mouth daily.     sertraline (ZOLOFT) 25 MG tablet TAKE ONE TABLET BY MOUTH ONCE DAILY 30 tablet 1   simvastatin (ZOCOR) 40 MG tablet TAKE ONE TABLET BY MOUTH AT BEDTIME 90 tablet 1   sulfamethoxazole-trimethoprim (BACTRIM DS) 800-160 MG tablet Take 1 tablet by mouth 2 (two) times daily. 6 tablet 0   tadalafil, PAH, (ALYQ) 20 MG tablet Take 2 tablets every day by oral route.     terbinafine (LAMISIL) 250 MG tablet Take 1 tablet (250 mg total) by mouth daily. 14 tablet 0   topiramate (TOPAMAX) 25 MG tablet Take 25 mg by mouth 2 (two) times daily.     traMADol (ULTRAM) 50 MG tablet Take 1 tablet (50 mg total) by mouth every 8 (eight) hours as needed for up to 7 days. 15 tablet 0   warfarin (COUMADIN) 6 MG tablet Take 6 mg by mouth at bedtime.     No current facility-administered medications for this visit.    Allergies   Allergies as of 08/29/2023 - Review Complete 08/22/2023  Allergen Reaction Noted   Amoxicillin-pot clavulanate Other (See Comments) 07/29/2020   Clarithromycin Other (See Comments) 12/02/2020   Erythromycin  04/02/2020   Lisinopril Swelling    Amoxicillin Diarrhea 09/20/2021     Past Medical History   Past Medical History:   Diagnosis Date   Acute myocardial infarction [facility]) 2009   CAD/no stent medically managed   Anxiety disorder    CAD (coronary artery disease)    Cancer (HCC)    multiple myeloma   Hyperlipidemia    Hypertension    IBS (irritable bowel syndrome)    Non-ulcer dyspepsia 04/30/2009   Qualifier: Diagnosis of  By: Darrick Penna MD, Darleene Cleaver  Overactive bladder    PE (pulmonary embolism) 10/2012   left lung   Presence of permanent cardiac pacemaker    Sleep apnea     Past Surgical History   Past Surgical History:  Procedure Laterality Date   ABDOMINAL HYSTERECTOMY     BSO secondary to cyst     CARDIAC CATHETERIZATION     COLONOSCOPY  12/2009   Dr. Aleene Davidson, propofol, normal. Next TCS 12/2019   COLONOSCOPY N/A 05/22/2017   Examined portion ileum was normal, significant looping of colon, external hemorrhoids and rectal bleeding due to internal hemorrhoids, mild diverticulosis procedure: COLONOSCOPY;  Surgeon: West Bali, MD;  Location: [facility];  Service: Endoscopy;  Laterality: N/A;  10:30am   ESOPHAGOGASTRODUODENOSCOPY  05/11/2009   schatzki ring/small hiatal hernia/path:gastritis   ESOPHAGOGASTRODUODENOSCOPY N/A 05/22/2017   Multiple gastric polyps with biopsy benign fundic gland polyp, small bowel biopsy negative for celiac, gastric biopsy with mild gastritis but no H. pylori procedure: ESOPHAGOGASTRODUODENOSCOPY (EGD);  Surgeon: West Bali, MD;  Location: [facility];  Service: Endoscopy;  Laterality: N/A;   ESOPHAGOGASTRODUODENOSCOPY (EGD) WITH ESOPHAGEAL DILATION N/A 04/01/2013   [id number] at the gastroesophageal juction/multiple small polyps/mild gastritis   GIVENS CAPSULE STUDY N/A 06/07/2017   Frequent gastric erosions, normal small bowel mucosa.  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    Past Family History   Family History  Problem Relation Age of Onset   Colon polyps Neg Hx    Colon cancer Neg Hx     Past Social History   Social History   Socioeconomic History   Marital status: Married    Spouse name: Doctor, general practice   Number of children: 3   Years of education: 16   Highest education level: Master's degree (e.g., MA, MS, MEng, MEd, MSW, MBA)  Occupational History   Not on file  Tobacco Use   Smoking status: Never    Passive exposure: Never   Smokeless tobacco: Never   Tobacco comments:    Never smoked   Vaping Use   Vaping status: Never Used  Substance and Sexual Activity   Alcohol use: Not Currently    Comment: occ wine   Drug use: No   Sexual activity: Not Currently  Other Topics Concern   Not on file  Social History Narrative      Lives with husband-51 years 950 in Aug 2021   Daughter is close by, 2 sons live further away   One IN ,ONE IN [place], ONE BESIDE HER.        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   Enjoys: reading, young adult      Diet: eats all food groups    Caffeine: coffee 1, tea daily soda-1 daily   Water: 1-2 cups      Wears seat belt    Does not use phone while driving   Smoke detectors at home    Licensed conveyancer -locked up      Left handed   One story home   Drinks caffeine   Social Drivers of Health   Financial Resource Strain: Low Risk  (06/26/2023)   Overall Financial Resource Strain (CARDIA)    Difficulty of Paying Living Expenses: Not hard at all  Food Insecurity: No Food Insecurity (06/26/2023)   Hunger Vital Sign    Worried About Running Out of Food in the Last Year: Never true    Ran Out of Food in the Last Year: Never true  Transportation Needs: No Transportation Needs (06/26/2023)   PRAPARE - Administrator, Civil Service (Medical): No    Lack of Transportation (Non-Medical): No  Physical Activity: Insufficiently Active  (06/26/2023)   Exercise Vital Sign    Days of Exercise per Week: 2 days    Minutes of Exercise per Session: 20 min  Stress: Stress Concern Present (06/26/2023)   [employer] of Occupational Health - Occupational Stress Questionnaire    Feeling of Stress : To some extent  Social Connections: Socially Integrated (06/26/2023)   Social Connection and Isolation Panel [NHANES]    Frequency of Communication with Friends and Family: Twice a week    Frequency of Social Gatherings with Friends and Family: Once a week    Attends Religious Services: 1 to 4 times per year    Active Member of [employer] or Organizations: Yes    Attends Banker Meetings: 1 to 4 times per year    Marital Status: Married  Catering manager Violence: Not At Risk (12/21/2022)   Humiliation, Afraid, Rape, and Kick questionnaire    Fear of Current or Ex-Partner: No    Emotionally Abused: No    Physically Abused: No    Sexually Abused: No    Review of Systems   General: Negative for anorexia, weight loss, fever, chills, fatigue, weakness. ENT: Negative for hoarseness, difficulty swallowing , nasal congestion. CV: Negative for chest pain, angina, palpitations, dyspnea on exertion, peripheral edema.  Respiratory: Negative for dyspnea at rest, dyspnea on exertion, cough, sputum, wheezing.  GI: See history of present illness. GU:  Negative for dysuria, hematuria, urinary incontinence, urinary frequency, nocturnal urination.  Endo: Negative for unusual weight change.     Physical Exam   There were no vitals taken for this visit.   General: Well-nourished, well-developed in no acute distress.  Eyes: No icterus. Mouth: Oropharyngeal mucosa moist and pink , no lesions erythema or exudate. Lungs: Clear to auscultation bilaterally.  Heart: Regular rate and rhythm, no murmurs rubs or gallops.  Abdomen: Bowel sounds are normal, nontender, nondistended, no hepatosplenomegaly or masses,  no abdominal bruits or hernia ,  no rebound or guarding.  Rectal: ***  Extremities: No lower extremity edema. No clubbing or deformities. Neuro: Alert and oriented x 4   Skin: Warm and dry, no jaundice.   Psych: Alert and cooperative, normal mood and affect.  Labs   *** Imaging Studies   CT ABDOMEN PELVIS WO CONTRAST Result Date: 08/12/2023 CLINICAL DATA:  Right flank pain and urinary frequency. EXAM: CT ABDOMEN AND PELVIS WITHOUT CONTRAST TECHNIQUE: Multidetector CT imaging of the abdomen and pelvis was performed following the standard protocol without IV contrast. RADIATION DOSE REDUCTION: This exam was performed according to the departmental dose-optimization program which includes automated exposure control, adjustment of the mA and/or kV according to patient size and/or use of iterative reconstruction technique. COMPARISON:  02/12/2023. FINDINGS: Lower chest: Basilar bronchiectasis, calcification and scarring in the lung bases. Heart is at the upper limits of normal in size. No pericardial or pleural effusion. Distal esophagus is grossly unremarkable. Hepatobiliary: Liver and gallbladder are unremarkable. No biliary ductal dilatation. Pancreas: Negative. Spleen: Negative. Adrenals/Urinary Tract: Adrenal glands are unremarkable. Small stone in the lower pole right kidney. Low-attenuation lesion in the  right kidney. No specific follow-up necessary. Kidneys are otherwise unremarkable. Ureters are decompressed. Bladder is grossly unremarkable. Stomach/Bowel: Stomach, small bowel and colon are unremarkable. Appendix is not well-visualized. Vascular/Lymphatic: Vascular structures are unremarkable. No pathologically enlarged lymph nodes. Reproductive: Hysterectomy.  No adnexal mass. Other: No free fluid.  Mesenteries and peritoneum are unremarkable. Musculoskeletal: Osteopenia. Degenerative changes in the spine. L1 vertebral body augmentation. Minimal grade 1 anterolisthesis of L3 on L4. IMPRESSION: 1. No acute findings to explain the  patient's clinical history. 2. Small right renal stone. Electronically Signed   By: Leanna Battles M.D.   On: 08/12/2023 11:13   MM 3D SCREENING MAMMOGRAM BILATERAL BREAST Result Date: 08/03/2023 CLINICAL DATA:  Screening. EXAM: DIGITAL SCREENING BILATERAL MAMMOGRAM WITH TOMOSYNTHESIS AND CAD TECHNIQUE: Bilateral screening digital craniocaudal and mediolateral oblique mammograms were obtained. Bilateral screening digital breast tomosynthesis was performed. The images were evaluated with computer-aided detection. COMPARISON:  Previous exam(s). ACR Breast Density Category b: There are scattered areas of fibroglandular density. FINDINGS: There are no findings suspicious for malignancy. IMPRESSION: No mammographic evidence of malignancy. A result letter of this screening mammogram will be mailed directly to the patient. RECOMMENDATION: Screening mammogram in one year. (Code:SM-B-01Y) BI-RADS CATEGORY  1: Negative. Electronically Signed   By: Frederico Hamman M.D.   On: 08/03/2023 08:37    Assessment       PLAN   ***   Leanna Battles. Melvyn Neth, MHS, PA-C [facility]

## 2023-08-29 ENCOUNTER — Encounter: Payer: Self-pay | Admitting: Gastroenterology

## 2023-08-29 ENCOUNTER — Ambulatory Visit (INDEPENDENT_AMBULATORY_CARE_PROVIDER_SITE_OTHER): Admitting: Gastroenterology

## 2023-08-29 VITALS — BP 118/72 | HR 83 | Temp 97.5°F | Ht 63.0 in | Wt 136.6 lb

## 2023-08-29 DIAGNOSIS — R1030 Lower abdominal pain, unspecified: Secondary | ICD-10-CM | POA: Diagnosis not present

## 2023-08-29 DIAGNOSIS — R109 Unspecified abdominal pain: Secondary | ICD-10-CM | POA: Insufficient documentation

## 2023-08-29 DIAGNOSIS — K219 Gastro-esophageal reflux disease without esophagitis: Secondary | ICD-10-CM

## 2023-08-29 DIAGNOSIS — R634 Abnormal weight loss: Secondary | ICD-10-CM | POA: Diagnosis not present

## 2023-08-29 DIAGNOSIS — K5900 Constipation, unspecified: Secondary | ICD-10-CM | POA: Diagnosis not present

## 2023-08-29 NOTE — Patient Instructions (Signed)
 We will be in touch with you after discussing your case with Dr. Marletta Lor. It may be first of the week.  Recommend seeing urology as planned by your PCP. Referral pending.

## 2023-08-30 NOTE — ED Provider Notes (Signed)
 [place] EMERGENCY DEPARTMENT AT [facility] Provider Note   CSN: [id number] Arrival date & time: 08/21/23  1622     History  Chief Complaint  Patient presents with   Flank Pain    Courtney Rogers is a 73 y.o. female with a history including CAD, IBS, hypertension, history of PE on Coumadin and history of IgA myeloma under the care of local oncology presenting for evaluation of her persistent chronic right flank pain.  She has had this pain for over a month, describes pressure, sometimes sharp pain which is random, can occur at rest, not associated with fevers, dysuria, movement or positional changes.  She also denies chest pain, shortness of breath, abdominal pain nausea and vomiting.  When her pain for started she was treated for UTI, ultimately had been on 2 different antibiotics as she had persistent symptoms, her cultures grew out lactobacillus.  She denies hematuria, dysuria, urgency or frequency of urination.  At her last visit here she was diagnosed with yeast vaginitis and is concerned this infection may not have cleared.  She denies vaginal discharge, itching or other complaints.  She denies rash at the site of her pain. She was seen here for the same complaint 1 week ago at which time CT imaging showed a small stone in her right kidney pole, no hydronephrosis.  She has been seen by her PCP for this problem as well and is currently awaiting a urology evaluation for this complaint.  She is on no alleviators for her pain including Tylenol.  The history is provided by the patient.       Home Medications Prior to Admission medications   Medication Sig Start Date End Date Taking? Authorizing Provider  acetaminophen (TYLENOL) 500 MG tablet Take 1,000 mg by mouth 2 (two) times daily as needed for moderate pain or headache.    [provider]  acyclovir (ZOVIRAX) 400 MG tablet TAKE ONE TABLET BY MOUTH TWICE DAILY 07/04/23   Paulett Boros, MD  B Complex-Folic  Acid (B COMPLEX VITAMINS, W/ FA,) CAPS Take 1 capsule by mouth daily. 09/09/21   [provider]  busPIRone (BUSPAR) 5 MG tablet Take 5 mg by mouth daily.    Craige Dixon, MD  Calcium Carbonate-Vitamin D (CALCIUM 600+D PO) Take 1 tablet by mouth 2 (two) times daily.    [provider]  chlorthalidone (HYGROTON) 25 MG tablet Take 25 mg by mouth daily. 06/04/22   [provider]  cholecalciferol (VITAMIN D3) 10 MCG (400 UNIT) TABS tablet Take 400 Units by mouth daily. 09/30/22   [provider]  clindamycin (CLEOCIN) 150 MG capsule Take 1 capsule (150 mg total) by mouth 3 (three) times daily. 08/03/23   Towanda Fret, MD  diclofenac Sodium (VOLTAREN) 1 % GEL Apply 4 g topically 4 (four) times daily. 11/19/21   Zarwolo, [name], FNP  FEROSUL 325 (65 Fe) MG tablet Take 325 mg by mouth daily. 07/28/23   [provider]  fluticasone (FLONASE) 50 MCG/ACT nasal spray USE 2 SPRAYS IN EACH NOSTRIL ONCE DAILY 06/21/23   Zarwolo, [name], FNP  gabapentin (NEURONTIN) 100 MG capsule Take 100 mg by mouth 3 (three) times daily. 05/07/19   [provider]  lenalidomide (REVLIMID) 10 MG capsule Take 1 capsule (10 mg total) by mouth daily. 21 days on, 7 days off every 28 days 08/18/23   Katragadda, Sreedhar, MD  levocetirizine (XYZAL) 5 MG tablet TAKE ONE TABLET BY MOUTH IN THE [facility] 08/18/23  Zarwolo, [name], FNP  lidocaine (HM LIDOCAINE PATCH) 4 % Place 1 patch onto the skin daily. 04/12/23   Felicie Horning, PA-C  losartan (COZAAR) 100 MG tablet TAKE ONE TABLET BY MOUTH ONCE DAILY 08/18/23   Zarwolo, [name], FNP  magnesium oxide (MAG-OX) 400 (240 Mg) MG tablet TAKE ONE TABLET BY MOUTH TWICE DAILY 08/18/23   Paulett Boros, MD  metoprolol tartrate (LOPRESSOR) 50 MG tablet Take 1 tablet (50 mg total) by mouth 2 (two) times daily. 10/07/21   Paseda, Folashade R, FNP  montelukast (SINGULAIR) 10 MG tablet TAKE ONE TABLET BY MOUTH AT BEDTIME 06/01/23   Zarwolo,  [name], FNP  MYRBETRIQ 50 MG TB24 tablet TAKE ONE TABLET BY MOUTH ONCE DAILY 06/01/23   Zarwolo, [name], FNP  nitroGLYCERIN (NITROSTAT) 0.4 MG SL tablet Place 0.4 mg under the tongue every 5 (five) minutes as needed for chest pain.    [provider]  pantoprazole (PROTONIX) 40 MG tablet TAKE ONE TABLET BY MOUTH TWICE DAILY 05/03/23   Zarwolo, [name], FNP  primidone (MYSOLINE) 50 MG tablet TAKE ONE TABLET BY MOUTH TWICE DAILY MORNING AND BEDTIME 06/05/23   Zarwolo, [name], FNP  Probiotic Product (GNP PROBIOTIC COLON SUPPORT) CAPS Take 1 capsule by mouth daily. 07/08/21   [provider]  sertraline (ZOLOFT) 25 MG tablet TAKE ONE TABLET BY MOUTH ONCE DAILY 07/04/23   Zarwolo, [name], FNP  topiramate (TOPAMAX) 25 MG tablet Take 25 mg by mouth 2 (two) times daily. 01/06/23   [provider]  warfarin (COUMADIN) 6 MG tablet Take 6 mg by mouth at bedtime. 08/04/23   [provider]      Allergies    Amoxicillin-pot clavulanate, Clarithromycin, Erythromycin, Lisinopril, and Amoxicillin    Review of Systems   Review of Systems  Constitutional:  Negative for chills and fever.  HENT:  Negative for congestion and sore throat.   Eyes: Negative.   Respiratory:  Negative for chest tightness and shortness of breath.   Cardiovascular:  Negative for chest pain.  Gastrointestinal:  Negative for abdominal pain, constipation, diarrhea, nausea and vomiting.  Genitourinary:  Positive for flank pain. Negative for dysuria, frequency, hematuria and urgency.  Musculoskeletal:  Negative for arthralgias, joint swelling and neck pain.  Skin: Negative.  Negative for rash and wound.  Neurological:  Negative for dizziness, weakness, light-headedness, numbness and headaches.  Psychiatric/Behavioral: Negative.      Physical Exam Updated Vital Signs BP (!) 137/90   Pulse 82   Temp 98.5 F (36.9 C) (Oral)   Resp 17   Ht 5\' 3"  (1.6 m)   Wt 62.5 kg   SpO2 99%   BMI 24.39 kg/m   Physical Exam Vitals and nursing note reviewed.  Constitutional:      Appearance: She is well-developed.  HENT:     Head: Normocephalic and atraumatic.  Eyes:     Conjunctiva/sclera: Conjunctivae normal.  Cardiovascular:     Rate and Rhythm: Normal rate and regular rhythm.     Heart sounds: Normal heart sounds.  Pulmonary:     Effort: Pulmonary effort is normal.     Breath sounds: Normal breath sounds. No wheezing.  Chest:       Comments: Tenderness to palpation at the right lateral rib/flank region.  There is no palpable deformities.  No rash, no erythema.  She has no thoracic or lumbar midline pain.  No anterior chest wall pain. Abdominal:     General: Bowel sounds are normal.     Palpations: Abdomen  is soft.     Tenderness: There is no abdominal tenderness. There is no right CVA tenderness, left CVA tenderness or guarding.  Musculoskeletal:        General: Normal range of motion.     Cervical back: Normal range of motion.  Skin:    General: Skin is warm and dry.  Neurological:     Mental Status: She is alert.     ED Results / Procedures / Treatments   Labs (all labs ordered are listed, but only abnormal results are displayed) Labs Reviewed  URINALYSIS, ROUTINE W REFLEX MICROSCOPIC - Abnormal; Notable for the following components:      Result Value   Color, Urine AMBER (*)    APPearance CLOUDY (*)    Leukocytes,Ua SMALL (*)    Bacteria, UA RARE (*)    All other components within normal limits  CBC WITH DIFFERENTIAL/PLATELET - Abnormal; Notable for the following components:   WBC 3.7 (*)    RBC 3.54 (*)    MCV 105.1 (*)    MCH 35.9 (*)    Platelets 137 (*)    Neutro Abs 1.6 (*)    All other components within normal limits  COMPREHENSIVE METABOLIC PANEL WITH GFR - Abnormal; Notable for the following components:   Potassium 3.0 (*)    All other components within normal limits  WET PREP, GENITAL    EKG None  Radiology No results  found.  Procedures Procedures    Medications Ordered in ED Medications  potassium chloride SA (KLOR-CON M) CR tablet 40 mEq (40 mEq Oral Given 08/21/23 2300)    ED Course/ Medical Decision Making/ A&P                                 Medical Decision Making Patient presenting with chronic right flank/right lateral lower chest wall pain which is reproducible with palpation.  No palpable deformities, no rash or erythema to suggest shingles.  She underwent CT renal imaging last week revealing a small right renal stone, repeat imaging not indicated today.  She is scheduled to see urology which has been arranged by her primary provider.  Her laboratory tests are reassuring today with the exception of her potassium which still remains low as it was at her last visit.  Given a potassium here and encouraged to continue using her home potassium.  We discussed other treatments for pain control, she was interested in trying a lidocaine patch.  She has an appointment with her PCP tomorrow and was encouraged to keep this appointment.  Amount and/or Complexity of Data Reviewed Labs: ordered.    Details: Labs including a c-Met, CBC wet prep and urinalysis were obtained and reviewed, potassium remains low at 3.0, wet prep is negative, urinalysis small leukocytes, rare bacteria, no RBCs or WBCs on macro.  Patient has no symptoms suggesting UTI.  Risk Prescription drug management.           Final Clinical Impression(s) / ED Diagnoses Final diagnoses:  Right flank pain  Hypokalemia    Rx / DC Orders ED Discharge Orders     None         Alyse July 08/31/23 1554    Mordecai Applebaum, MD 09/02/23 [phone]

## 2023-08-31 ENCOUNTER — Other Ambulatory Visit: Payer: Self-pay

## 2023-08-31 ENCOUNTER — Inpatient Hospital Stay: Payer: Medicare Other

## 2023-08-31 DIAGNOSIS — C9000 Multiple myeloma not having achieved remission: Secondary | ICD-10-CM

## 2023-09-01 ENCOUNTER — Inpatient Hospital Stay: Attending: Hematology

## 2023-09-01 ENCOUNTER — Inpatient Hospital Stay

## 2023-09-01 VITALS — BP 117/57 | HR 64 | Temp 97.3°F | Resp 18 | Wt 135.6 lb

## 2023-09-01 DIAGNOSIS — C9000 Multiple myeloma not having achieved remission: Secondary | ICD-10-CM | POA: Insufficient documentation

## 2023-09-01 DIAGNOSIS — E876 Hypokalemia: Secondary | ICD-10-CM

## 2023-09-01 DIAGNOSIS — Z5112 Encounter for antineoplastic immunotherapy: Secondary | ICD-10-CM | POA: Diagnosis present

## 2023-09-01 DIAGNOSIS — Z79899 Other long term (current) drug therapy: Secondary | ICD-10-CM | POA: Diagnosis not present

## 2023-09-01 LAB — CBC WITH DIFFERENTIAL/PLATELET
Abs Immature Granulocytes: 0.01 10*3/uL (ref 0.00–0.07)
Basophils Absolute: 0 10*3/uL (ref 0.0–0.1)
Basophils Relative: 1 %
Eosinophils Absolute: 0.2 10*3/uL (ref 0.0–0.5)
Eosinophils Relative: 5 %
HCT: 35.1 % — ABNORMAL LOW (ref 36.0–46.0)
Hemoglobin: 12 g/dL (ref 12.0–15.0)
Immature Granulocytes: 0 %
Lymphocytes Relative: 23 %
Lymphs Abs: 0.9 10*3/uL (ref 0.7–4.0)
MCH: 35.6 pg — ABNORMAL HIGH (ref 26.0–34.0)
MCHC: 34.2 g/dL (ref 30.0–36.0)
MCV: 104.2 fL — ABNORMAL HIGH (ref 80.0–100.0)
Monocytes Absolute: 0.3 10*3/uL (ref 0.1–1.0)
Monocytes Relative: 8 %
Neutro Abs: 2.4 10*3/uL (ref 1.7–7.7)
Neutrophils Relative %: 63 %
Platelets: 122 10*3/uL — ABNORMAL LOW (ref 150–400)
RBC: 3.37 MIL/uL — ABNORMAL LOW (ref 3.87–5.11)
RDW: 13.5 % (ref 11.5–15.5)
WBC: 3.8 10*3/uL — ABNORMAL LOW (ref 4.0–10.5)
nRBC: 0 % (ref 0.0–0.2)

## 2023-09-01 LAB — COMPREHENSIVE METABOLIC PANEL WITH GFR
ALT: 21 U/L (ref 0–44)
AST: 24 U/L (ref 15–41)
Albumin: 3.8 g/dL (ref 3.5–5.0)
Alkaline Phosphatase: 44 U/L (ref 38–126)
Anion gap: 12 (ref 5–15)
BUN: 21 mg/dL (ref 8–23)
CO2: 27 mmol/L (ref 22–32)
Calcium: 9.3 mg/dL (ref 8.9–10.3)
Chloride: 96 mmol/L — ABNORMAL LOW (ref 98–111)
Creatinine, Ser: 0.86 mg/dL (ref 0.44–1.00)
GFR, Estimated: >60 mL/min (ref >60)
Glucose, Bld: 79 mg/dL (ref 70–99)
Potassium: 2.7 mmol/L — CL (ref 3.5–5.1)
Sodium: 135 mmol/L (ref 135–145)
Total Bilirubin: 0.6 mg/dL (ref 0.0–1.2)
Total Protein: 6.7 g/dL (ref 6.5–8.1)

## 2023-09-01 MED ORDER — POTASSIUM CHLORIDE CRYS ER 20 MEQ PO TBCR
20.0000 meq | EXTENDED_RELEASE_TABLET | Freq: Every day | ORAL | 3 refills | Status: DC
Start: 2023-09-01 — End: 2023-09-04

## 2023-09-01 MED ORDER — POTASSIUM CHLORIDE IN NACL 20-0.9 MEQ/L-% IV SOLN
Freq: Once | INTRAVENOUS | Status: AC
Start: 2023-09-01 — End: 2023-09-01
  Filled 2023-09-01: qty 1000

## 2023-09-01 MED ORDER — POTASSIUM CHLORIDE CRYS ER 20 MEQ PO TBCR
40.0000 meq | EXTENDED_RELEASE_TABLET | Freq: Once | ORAL | Status: AC
Start: 2023-09-01 — End: 2023-09-01
  Administered 2023-09-01: 40 meq via ORAL
  Filled 2023-09-01: qty 2

## 2023-09-01 MED ORDER — BORTEZOMIB CHEMO SQ INJECTION 3.5 MG (2.5MG/ML)
0.7000 mg/m2 | Freq: Once | INTRAMUSCULAR | Status: AC
  Administered 2023-09-01: 1.25 mg via SUBCUTANEOUS
  Filled 2023-09-01: qty 0.5

## 2023-09-01 MED ORDER — PROCHLORPERAZINE MALEATE 10 MG PO TABS
10.0000 mg | ORAL_TABLET | Freq: Once | ORAL | Status: AC
  Administered 2023-09-01: 10 mg via ORAL
  Filled 2023-09-01: qty 1

## 2023-09-01 NOTE — Patient Instructions (Signed)
 [facility] [place] - A DEPT OF [facility]  Discharge Instructions: Thank you for choosing [facility] to provide your oncology and hematology care.  If you have a lab appointment with the Cancer Center - please note that after April 8th, 2024, all labs will be drawn in the cancer center.  You do not have to check in or register with the main entrance as you have in the past but will complete your check-in in the cancer center.  Wear comfortable clothing and clothing appropriate for easy access to any Portacath or PICC line.   We strive to give you quality time with your provider. You may need to reschedule your appointment if you arrive late (15 or more minutes).  Arriving late affects you and other patients whose appointments are after yours.  Also, if you miss three or more appointments without notifying the office, you may be dismissed from the clinic at the provider's discretion.      For prescription refill requests, have your pharmacy contact our office and allow 72 hours for refills to be completed.    Today you received the following chemotherapy and/or immunotherapy agents Velcade.  Bortezomib Injection What is this medication? BORTEZOMIB (bor TEZ oh mib) treats lymphoma. It may also be used to treat multiple myeloma, a type of bone marrow cancer. It works by blocking a protein that causes cancer cells to grow and multiply. This helps to slow or stop the spread of cancer cells. This medicine may be used for other purposes; ask your health care provider or pharmacist if you have questions. COMMON BRAND NAME(S): BORUZU, Velcade What should I tell my care team before I take this medication? They need to know if you have any of these conditions: Dehydration Diabetes Heart disease Liver disease Tingling of the fingers or toes or other nerve disorder An unusual or allergic reaction to bortezomib, other medications, foods, dyes, or preservatives If  you or your partner are pregnant or trying to get pregnant Breastfeeding How should I use this medication? This medication is injected into a vein or under the skin. It is given by your care team in a hospital or clinic setting. Talk to your care team about the use of this medication in children. Special care may be needed. Overdosage: If you think you have taken too much of this medicine contact a poison control center or emergency room at once. NOTE: This medicine is only for you. Do not share this medicine with others. What if I miss a dose? Keep appointments for follow-up doses. It is important not to miss your dose. Call your care team if you are unable to keep an appointment. What may interact with this medication? Ketoconazole Rifampin This list may not describe all possible interactions. Give your health care provider a list of all the medicines, herbs, non-prescription drugs, or dietary supplements you use. Also tell them if you smoke, drink alcohol, or use illegal drugs. Some items may interact with your medicine. What should I watch for while using this medication? Your condition will be monitored carefully while you are receiving this medication. You may need blood work while taking this medication. This medication may affect your coordination, reaction time, or judgment. Do not drive or operate machinery until you know how this medication affects you. Sit up or stand slowly to reduce the risk of dizzy or fainting spells. Drinking alcohol with this medication can increase the risk of these side effects.  This medication may increase your risk of getting an infection. Call your care team for advice if you get a fever, chills, sore throat, or other symptoms of a cold or flu. Do not treat yourself. Try to avoid being around people who are sick. Check with your care team if you have severe diarrhea, nausea, and vomiting, or if you sweat a lot. The loss of too much body fluid may make it  dangerous for you to take this medication. Talk to your care team if you may be pregnant. Serious birth defects can occur if you take this medication during pregnancy and for 7 months after the last dose. You will need a negative pregnancy test before starting this medication. Contraception is recommended while taking this medication and for 7 months after the last dose. Your care team can help you find the option that works for you. If your partner can get pregnant, use a condom during sex while taking this medication and for 4 months after the last dose. Do not breastfeed while taking this medication and for 2 months after the last dose. This medication may cause infertility. Talk to your care team if you are concerned about your fertility. What side effects may I notice from receiving this medication? Side effects that you should report to your care team as soon as possible: Allergic reactions--skin rash, itching, hives, swelling of the face, lips, tongue, or throat Bleeding--bloody or black, tar-like stools, vomiting blood or [place] material that looks like coffee grounds, red or dark [place] urine, small red or purple spots on skin, unusual bruising or bleeding Bleeding in the brain--severe headache, stiff neck, confusion, dizziness, change in vision, numbness or weakness of the face, arm, or leg, trouble speaking, trouble walking, vomiting Bowel blockage--stomach cramping, unable to have a bowel movement or pass gas, loss of appetite, vomiting Heart failure--shortness of breath, swelling of the ankles, feet, or hands, sudden weight gain, unusual weakness or fatigue Infection--fever, chills, cough, sore throat, wounds that don't heal, pain or trouble when passing urine, general feeling of discomfort or being unwell Liver injury--right upper belly pain, loss of appetite, nausea, light-colored stool, dark yellow or [place] urine, yellowing skin or eyes, unusual weakness or fatigue Low blood  pressure--dizziness, feeling faint or lightheaded, blurry vision Lung injury--shortness of breath or trouble breathing, cough, spitting up blood, chest pain, fever Pain, tingling, or numbness in the hands or feet Severe or prolonged diarrhea Stomach pain, bloody diarrhea, pale skin, unusual weakness or fatigue, decrease in the amount of urine, which may be signs of hemolytic uremic syndrome Sudden and severe headache, confusion, change in vision, seizures, which may be signs of posterior reversible encephalopathy syndrome (PRES) TTP--purple spots on the skin or inside the mouth, pale skin, yellowing skin or eyes, unusual weakness or fatigue, fever, fast or irregular heartbeat, confusion, change in vision, trouble speaking, trouble walking Tumor lysis syndrome (TLS)--nausea, vomiting, diarrhea, decrease in the amount of urine, dark urine, unusual weakness or fatigue, confusion, muscle pain or cramps, fast or irregular heartbeat, joint pain Side effects that usually do not require medical attention (report to your care team if they continue or are bothersome): Constipation Diarrhea Fatigue Loss of appetite Nausea This list may not describe all possible side effects. Call your doctor for medical advice about side effects. You may report side effects to FDA at 1-800-FDA-1088. Where should I keep my medication? This medication is given in a hospital or clinic. It will not be stored at home. NOTE: This sheet  is a summary. It may not cover all possible information. If you have questions about this medicine, talk to your doctor, pharmacist, or health care provider.  2024 Elsevier/Gold Standard (2021-10-12 00:00:00)       To help prevent nausea and vomiting after your treatment, we encourage you to take your nausea medication as directed.  BELOW ARE SYMPTOMS THAT SHOULD BE REPORTED IMMEDIATELY: *FEVER GREATER THAN 100.4 F (38 C) OR HIGHER *CHILLS OR SWEATING *NAUSEA AND VOMITING THAT IS NOT  CONTROLLED WITH YOUR NAUSEA MEDICATION *UNUSUAL SHORTNESS OF BREATH *UNUSUAL BRUISING OR BLEEDING *URINARY PROBLEMS (pain or burning when urinating, or frequent urination) *BOWEL PROBLEMS (unusual diarrhea, constipation, pain near the anus) TENDERNESS IN MOUTH AND THROAT WITH OR WITHOUT PRESENCE OF ULCERS (sore throat, sores in mouth, or a toothache) UNUSUAL RASH, SWELLING OR PAIN  UNUSUAL VAGINAL DISCHARGE OR ITCHING   Items with * indicate a potential emergency and should be followed up as soon as possible or go to the Emergency Department if any problems should occur.  Please show the CHEMOTHERAPY ALERT CARD or IMMUNOTHERAPY ALERT CARD at check-in to the Emergency Department and triage nurse.  Should you have questions after your visit or need to cancel or reschedule your appointment, please contact [facility]  - A DEPT OF [facility] [phone]  and follow the prompts.  Office hours are 8:00 a.m. to 4:30 p.m. Monday - Friday. Please note that voicemails left after 4:00 p.m. may not be returned until the following business day.  We are closed weekends and major holidays. You have access to a nurse at all times for urgent questions. Please call the main number to the clinic [phone] and follow the prompts.  For any non-urgent questions, you may also contact your provider using MyChart. We now offer e-Visits for anyone 29 and older to request care online for non-urgent symptoms. For details visit [email or web address].   Also download the MyChart app! Go to the app store, search "MyChart", open the app, select [place], and log in with your MyChart username and password.

## 2023-09-01 NOTE — Progress Notes (Signed)
 CRITICAL VALUE ALERT Critical value received:  potassium 2.7 Date of notification:  09-01-2023 Time of notification: 10:33 am. Critical value read back:  Yes.   Nurse who received alert:  B.Kaelee Pfeffer Rn.  MD notified time and response:  Dr. Anders Simmonds @ 10:36 am. Standing orders followed.

## 2023-09-01 NOTE — Progress Notes (Signed)
 Patient presents today for Velcade injection.   Patient is in satisfactory condition with no new complaints voiced.  Vital signs are stable.  Labs reviewed and all labs are within treatment parameters.  Potassium today is 2.7.  We will give NaCl with Kcl 20 mEq IV x one dose with Klor Con 40 mEq PO pre and post IV infusion per standing orders by Dr. Ellin Saba.  We will proceed with treatment per MD orders.   Patient tolerated Velcade and potassium well with no complaints voiced.  Patient left ambulatory in stable condition.  Vital signs stable at discharge.  Follow up as scheduled.

## 2023-09-04 ENCOUNTER — Other Ambulatory Visit: Payer: Self-pay | Admitting: *Deleted

## 2023-09-04 ENCOUNTER — Telehealth: Payer: Self-pay | Admitting: *Deleted

## 2023-09-04 DIAGNOSIS — C9000 Multiple myeloma not having achieved remission: Secondary | ICD-10-CM

## 2023-09-04 DIAGNOSIS — E876 Hypokalemia: Secondary | ICD-10-CM

## 2023-09-04 MED ORDER — POTASSIUM CHLORIDE CRYS ER 20 MEQ PO TBCR
20.0000 meq | EXTENDED_RELEASE_TABLET | Freq: Two times a day (BID) | ORAL | 3 refills | Status: DC
Start: 2023-09-04 — End: 2023-09-04

## 2023-09-04 MED ORDER — POTASSIUM CHLORIDE CRYS ER 20 MEQ PO TBCR: 20.0000 meq | EXTENDED_RELEASE_TABLET | Freq: Two times a day (BID) | ORAL | 3 refills | Status: DC

## 2023-09-04 NOTE — Telephone Encounter (Signed)
 Patient advised that potassium low at 2.7.  Per Dr. Katragadda, she was advised to increase to taking 20 meq twice a day.  Verbalized understanding and updated script sent.

## 2023-09-07 ENCOUNTER — Telehealth: Payer: Self-pay

## 2023-09-07 ENCOUNTER — Other Ambulatory Visit: Payer: Self-pay | Admitting: *Deleted

## 2023-09-07 NOTE — Telephone Encounter (Signed)
 Pt called wanting to know if you was able to talk to Dr. Mordechai April regarding her weight loss.

## 2023-09-12 ENCOUNTER — Other Ambulatory Visit: Payer: Self-pay

## 2023-09-12 MED ORDER — LENALIDOMIDE 10 MG PO CAPS: ORAL_CAPSULE | ORAL | 0 refills | Status: DC

## 2023-09-12 NOTE — Telephone Encounter (Signed)
 L/m for pt to return call

## 2023-09-12 NOTE — Telephone Encounter (Signed)
 Please let patient know that I did speak with Dr. Mordechai April.   He is advising to consider EGD/colonoscopy to evaluate her weight loss if she is willing. ASA 3. Cannot rule out GI source.   We would need to get approval to hold her coumadin for 4 days. She would need to hold iron 7 days.

## 2023-09-12 NOTE — Telephone Encounter (Signed)
 Chart reviewed. Revlimid refilled per last office note with Dr. Ellin Saba.

## 2023-09-14 ENCOUNTER — Inpatient Hospital Stay: Payer: Medicare Other

## 2023-09-14 VITALS — BP 118/62 | HR 63 | Temp 96.3°F | Resp 18 | Wt 140.8 lb

## 2023-09-14 DIAGNOSIS — C9000 Multiple myeloma not having achieved remission: Secondary | ICD-10-CM

## 2023-09-14 DIAGNOSIS — Z5112 Encounter for antineoplastic immunotherapy: Secondary | ICD-10-CM | POA: Diagnosis not present

## 2023-09-14 LAB — CBC WITH DIFFERENTIAL/PLATELET
Abs Immature Granulocytes: 0.01 10*3/uL (ref 0.00–0.07)
Basophils Absolute: 0 10*3/uL (ref 0.0–0.1)
Basophils Relative: 1 %
Eosinophils Absolute: 0.1 10*3/uL (ref 0.0–0.5)
Eosinophils Relative: 3 %
HCT: 32.7 % — ABNORMAL LOW (ref 36.0–46.0)
Hemoglobin: 11.6 g/dL — ABNORMAL LOW (ref 12.0–15.0)
Immature Granulocytes: 0 %
Lymphocytes Relative: 34 %
Lymphs Abs: 0.9 10*3/uL (ref 0.7–4.0)
MCH: 36.1 pg — ABNORMAL HIGH (ref 26.0–34.0)
MCHC: 35.5 g/dL (ref 30.0–36.0)
MCV: 101.9 fL — ABNORMAL HIGH (ref 80.0–100.0)
Monocytes Absolute: 0.7 10*3/uL (ref 0.1–1.0)
Monocytes Relative: 27 %
Neutro Abs: 0.9 10*3/uL — ABNORMAL LOW (ref 1.7–7.7)
Neutrophils Relative %: 35 %
Platelets: 147 10*3/uL — ABNORMAL LOW (ref 150–400)
RBC: 3.21 MIL/uL — ABNORMAL LOW (ref 3.87–5.11)
RDW: 13.1 % (ref 11.5–15.5)
WBC: 2.7 10*3/uL — ABNORMAL LOW (ref 4.0–10.5)
nRBC: 0 % (ref 0.0–0.2)

## 2023-09-14 LAB — COMPREHENSIVE METABOLIC PANEL WITH GFR
ALT: 23 U/L (ref 0–44)
AST: 25 U/L (ref 15–41)
Albumin: 3.5 g/dL (ref 3.5–5.0)
Alkaline Phosphatase: 40 U/L (ref 38–126)
Anion gap: 10 (ref 5–15)
BUN: 14 mg/dL (ref 8–23)
CO2: 25 mmol/L (ref 22–32)
Calcium: 8.3 mg/dL — ABNORMAL LOW (ref 8.9–10.3)
Chloride: 89 mmol/L — ABNORMAL LOW (ref 98–111)
Creatinine, Ser: 0.73 mg/dL (ref 0.44–1.00)
GFR, Estimated: >60 mL/min (ref >60)
Glucose, Bld: 98 mg/dL (ref 70–99)
Potassium: 3.4 mmol/L — ABNORMAL LOW (ref 3.5–5.1)
Sodium: 124 mmol/L — ABNORMAL LOW (ref 135–145)
Total Bilirubin: 0.6 mg/dL (ref 0.0–1.2)
Total Protein: 6.2 g/dL — ABNORMAL LOW (ref 6.5–8.1)

## 2023-09-14 MED ORDER — SODIUM CHLORIDE 0.9 % IV SOLN: Freq: Once | INTRAVENOUS | Status: AC

## 2023-09-14 NOTE — Progress Notes (Signed)
 Patient presents today for Velcade  injection. Patient c/o L shoulder pain and increasing fatigue x the last few weeks.  Vital signs are stable.  Labs reviewed and all labs are within treatment parameters.    We will hold treatment today per Dr. Katragadda.  We will give 1 L NS x 2 hrs per MD.  MD will see patient next week and patient was advised to return to the ED if symptoms worsened.

## 2023-09-14 NOTE — Progress Notes (Signed)
 Pt tolerated fluids well with no complications. VSS. Pt stable at discharge and transportation called. Pt left in satisfactory condition. AVS printed with updated appointments and handed to patient. All follow ups as scheduled.   Courtney Rogers [employer]

## 2023-09-15 ENCOUNTER — Telehealth: Payer: Self-pay | Admitting: *Deleted

## 2023-09-15 NOTE — Telephone Encounter (Signed)
 Faxed clearance to Dr. Craige Dixon in [place], [place]

## 2023-09-15 NOTE — Telephone Encounter (Signed)
 Pt was made aware and is willing to have procedures done. Pt is ready to move forward with scheduling.

## 2023-09-15 NOTE — Telephone Encounter (Signed)
  Request for patient to stop medication prior to procedure or is needing cleareance  09/15/23  Courtney Rogers Jan 15, 1951  What type of surgery is being performed? Colonoscopy/ Esophagogastroduodenoscopy (EGD) When is surgery scheduled? TBD  What type of clearance is required (medical or pharmacy to hold medication or both? medication  Are there any medications that need to be held prior to surgery and how long? Coumadin x 4 days  Name of physician performing surgery?  Dr.Charles Chryl [facility] Phone: [phone] Fax: [phone]  Anethesia type (none, local, MAC, general)? MAC

## 2023-09-18 ENCOUNTER — Ambulatory Visit: Payer: Self-pay

## 2023-09-18 NOTE — Telephone Encounter (Signed)
 Copied from CRM [id number]. Topic: Clinical - Medication Question >> Sep 18, 2023 10:38 AM Carlatta H wrote: Reason for CRM: Patient would like to know if dosage for sertraline  (ZOLOFT ) 25 MG tablet [[id number]] can be increased//Please call to advise   Called pt and left message and call back number with Ivin Marrow pt's sister-in-law. Pt is at another appt   09/18/23 4:00pm- Spoke with relative at home number, patient not home at this time and won't be back for a will. Will place in follow-up call backs for NT

## 2023-09-18 NOTE — Telephone Encounter (Signed)
 3rd attempt, pt is still not home, family will have patient return call when she returns. Routing to clinic FYI

## 2023-09-18 NOTE — Telephone Encounter (Signed)
 Copied from CRM [id number]. Topic: Clinical - Medication Question >> Sep 18, 2023 10:38 AM Carlatta H wrote: Reason for CRM: Patient would like to know if dosage for sertraline  (ZOLOFT ) 25 MG tablet [[id number]] can be increased//Please call to advise   Called pt and left message and call back number with Ivin Marrow pt's sister-in-law. Pt is at another appt

## 2023-09-19 ENCOUNTER — Ambulatory Visit
Admission: RE | Admit: 2023-09-19 | Discharge: 2023-09-19 | Disposition: A | Discharge status: Home / Self Care | Source: Ambulatory Visit | Attending: Family Medicine | Admitting: Family Medicine

## 2023-09-19 ENCOUNTER — Ambulatory Visit: Payer: Self-pay | Admitting: Family Medicine

## 2023-09-19 ENCOUNTER — Other Ambulatory Visit: Payer: Self-pay | Admitting: Family Medicine

## 2023-09-19 VITALS — BP 118/77 | HR 63 | Temp 98.2°F | Resp 16

## 2023-09-19 DIAGNOSIS — F339 Major depressive disorder, recurrent, unspecified: Secondary | ICD-10-CM

## 2023-09-19 DIAGNOSIS — F439 Reaction to severe stress, unspecified: Secondary | ICD-10-CM | POA: Diagnosis not present

## 2023-09-19 MED ORDER — SERTRALINE HCL 50 MG PO TABS: 50.0000 mg | ORAL_TABLET | Freq: Every day | ORAL | 3 refills | Status: DC

## 2023-09-19 NOTE — Telephone Encounter (Signed)
 Chief Complaint: Depressed for a few months  Symptoms: "Wants to sleep a lot," decreased energy, feelings of sadness Pertinent Negatives: Patient denies SI/HI  Disposition: /[x] Urgent Care (no appt availability in office)   Additional Notes: Patient states she has lost interest in a lot of things, no energy, and wants to "get going again." Pt is requesting an increase of her zoloft  dosage. This RN advised pt to be seen within next 24 hours. Pt scheduled for an urgent care visit today as no appointment availability in office. This RN educated pt on  new-worsening symptoms and when to call back/seek emergent care. Pt verbalized understanding and agrees to plan.    Copied from CRM [id number]. Topic: Clinical - Medication Question >> Sep 18, 2023 10:38 AM Carlatta H wrote: Reason for CRM: Patient would like to know if dosage for sertraline  (ZOLOFT ) 25 MG tablet [[id number]] can be increased//Please call to advise Reason for Disposition  [1] Depression AND [2] worsening (e.g., sleeping poorly, less able to do activities of daily living)  Answer Assessment - Initial Assessment Questions CONCERN: "What happened that made you call today?"     Wants an increase in dosage of zoloft  DEPRESSION SYMPTOM SCREENING: "How are you feeling overall?" (e.g., decreased energy, increased sleeping or difficulty sleeping, difficulty concentrating, feelings of sadness, guilt, hopelessness, or worthlessness)     Decreased energy, sad RISK OF HARM - SUICIDAL IDEATION:  "Do you ever have thoughts of hurting or killing yourself?"  (e.g., yes, no, no but preoccupation with thoughts about death)  Denies RISK OF HARM - HOMICIDAL IDEATION:  "Do you ever have thoughts of hurting or killing someone else?"  (e.g., yes, no, no but preoccupation with thoughts about death)   - Denies FUNCTIONAL IMPAIRMENT: "How have things been going for you overall? Have you had more difficulty than usual doing your normal daily activities?"  (e..,  better, same, worse; self-care, school, work, interactions)     Has lost interest in doing things, no energy SUPPORT: "Who is with you now?" "Who do you live with?" "Do you have family or friends who you can talk to?"      "Have people who live with me but they can't help. My husband has dementia. My sister can't hear very well" ALCOHOL USE OR SUBSTANCE USE (DRUG USE): "Do you drink alcohol or use any illegal drugs?"     Denies alcohol OTHER: "Do you have any other physical symptoms right now?" (e.g., fever)       "I had pain in right hip that is still there sometimes." Pt states she has a kidney stone  Protocols used: Depression-A-AH

## 2023-09-19 NOTE — Discharge Instructions (Signed)
 Please follow-up with your primary care provider regarding your concerns about the stress at home if you qualify for home health services.

## 2023-09-19 NOTE — Telephone Encounter (Signed)
 FYI: Pt. Is being seen in UC however she has requested for you to review her concerns.

## 2023-09-19 NOTE — ED Triage Notes (Signed)
 Pt reports she wants an increase of her Soloft meds or any med that will make her "perk up". Pt states she is constantly tired, laying down and depressed. Then she gets tired of laying down. Pt reports challenges she faces at home and she just "needs a break". Pt states  when asked does she wish to be dead she states " If God didn't wake me up I wouldn't mind." Pt states she wishes she was in the hospital so that someone could take are of her.    States toes and fingers are numb. But states this isn't new.

## 2023-09-19 NOTE — Telephone Encounter (Signed)
 First attempt. To contact pt. For information on new medication. Was told that she is otw here to the office. Will try to update her when she arrives.

## 2023-09-19 NOTE — ED Provider Notes (Signed)
 [facility]    CSN: [id number] Arrival date & time: 09/19/23  1518      History   Chief Complaint Chief Complaint  Patient presents with   Fatigue    Entered by patient    HPI Courtney Rogers is a 73 y.o. female.   Patient presents today for concern that she may need help at home.  Reports she is tired from caring for her husband who has dementia and her sister who also lives with them.  Reports it is very difficult for her to cook and clean for everybody in the home.  Reports she has been feeling more tired lately and is wondering if her depression medicine is working.  She has reach out to her primary care provider and has an appointment next month.  Her primary care provider sent her an increased dose of the depression medicine today that she has not yet picked up from the pharmacy.  Patient denies suicidal or homicidal ideation.  Patient reports "I just want to go to the hospital so they will take care of me."  She denies any medical concerns including any fever, body aches or chills, runny or stuffy nose, chest pain, shortness of breath, abdominal pain, nausea/vomiting, diarrhea.  She reports she is in pain "all over" and has numbness/tingling in her fingertips.  She reports this is not new for her and has history of neuropathy.    Past Medical History:  Diagnosis Date   Acute myocardial infarction [facility]) 2009   CAD/no stent medically managed   Anxiety disorder    CAD (coronary artery disease)    Cancer (HCC)    multiple myeloma   Hyperlipidemia    Hypertension    IBS (irritable bowel syndrome)    Non-ulcer dyspepsia 04/30/2009   Qualifier: Diagnosis of  By: Nolene Baumgarten MD, Sandi L    Overactive bladder    PE (pulmonary embolism) 10/2012   left lung   Presence of permanent cardiac pacemaker    Sleep apnea     Patient Active Problem List   Diagnosis Date Noted   Right sided abdominal pain 08/29/2023   Right flank pain 08/29/2023   Right kidney stone  08/23/2023   Frequent urinary tract infections 08/23/2023   Screening for STD (sexually transmitted disease) 08/09/2023   Dysuria 07/23/2023   Itching 07/23/2023   Urinary tract infection symptoms 07/18/2023   Fungal nail infection 07/18/2023   Toe pain, right 07/18/2023   Cervical radiculopathy 06/27/2023   Sleep disturbance 02/26/2023   Polypharmacy 01/30/2023   Diarrhea 01/30/2023   Hypokalemia 01/12/2023   Numbness and tingling of both feet 11/15/2022   OAB (overactive bladder) 10/24/2022   Vaginal wall prolapse 10/24/2022   Pulmonary embolism (HCC) 10/24/2022   Postmenopausal state 10/24/2022   Obstructive sleep apnea syndrome 10/24/2022   Midline cystocele 10/24/2022   Social isolation 09/30/2022   Acute left-sided thoracic back pain 02/10/2022   Atypical chest pain 01/13/2022   Fall 09/16/2021   Anxiety and depression 06/18/2021   Generalized weakness 06/04/2021   Abnormal gait 05/06/2021   Amnesia 05/06/2021   Anemia due to chronic blood loss 05/06/2021   Asthma 05/06/2021   Atherosclerotic heart disease of native coronary artery without angina pectoris 05/06/2021   Cardiac pacemaker in situ 05/06/2021   Chronic pain 05/06/2021   Coagulation disorder (HCC) 05/06/2021   Panic disorder 05/06/2021   H/O: hysterectomy 05/06/2021   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   Long term (current) use of anticoagulants 05/06/2021  Depression, recurrent (HCC) 05/06/2021   Mild intermittent asthma 05/06/2021   Mixed hyperlipidemia 05/06/2021   Monoclonal gammopathy 05/06/2021   Obesity 05/06/2021   Personal history of pulmonary embolism 05/06/2021   Recurrent major depression in remission (HCC) 05/06/2021   Severe recurrent major depression without psychotic features (HCC) 05/06/2021   Skin sensation disturbance 05/06/2021   Unspecified mononeuropathy of left upper limb 05/06/2021   Urinary incontinence 05/06/2021   Tremors of nervous system 04/13/2021   Left hip pain 04/13/2021    Allergic rhinitis 03/22/2021   Allergic sinusitis 03/22/2021   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   Generalized abdominal pain 02/02/2021   Buttock pain 10/01/2020   Right knee pain 07/15/2020   Immunization due 07/15/2020   Encounter for subsequent annual wellness visit (AWV) in Medicare patient 07/15/2020   Easy bruising 04/30/2020   Wedge compression fracture of first lumbar vertebra, subsequent encounter for fracture with routine healing 04/06/2020   Trapezius muscle spasm 02/27/2020   Body mass index (BMI) 28.0-28.9, adult 01/14/2020   DDD (degenerative disc disease), cervical 01/02/2020   Hoarseness of voice 11/27/2019   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 Myrlene Asper, propofol, normal. Next TCS 12/2019   COLONOSCOPY N/A 05/22/2017   Examined portion ileum was normal, significant looping of colon, external hemorrhoids and rectal bleeding due to internal hemorrhoids, mild diverticulosis procedure: COLONOSCOPY;  Surgeon: Alyce Jubilee, MD;  [facility]: [facility];  Service: Endoscopy;  Laterality: N/A;  10:30am   ESOPHAGOGASTRODUODENOSCOPY  05/11/2009   schatzki ring/small hiatal hernia/path:gastritis   ESOPHAGOGASTRODUODENOSCOPY N/A 05/22/2017   Multiple gastric polyps with biopsy benign fundic gland polyp, small bowel biopsy negative for celiac, gastric biopsy with mild gastritis but no H. pylori procedure: ESOPHAGOGASTRODUODENOSCOPY (EGD);  Surgeon: Alyce Jubilee, MD;  Location: [facility];  Service: Endoscopy;  Laterality: N/A;    ESOPHAGOGASTRODUODENOSCOPY (EGD) WITH ESOPHAGEAL DILATION N/A 04/01/2013   JYN:[id number] at the gastroesophageal juction/multiple small polyps/mild gastritis   GIVENS CAPSULE STUDY N/A 06/07/2017   Frequent gastric erosions, normal small bowel mucosa.  Procedure: GIVENS CAPSULE STUDY;  Surgeon: Alyce Jubilee, MD;  Location: [facility];  Service: Endoscopy;  Laterality: N/A;  7:30am   INSERT / REPLACE / REMOVE PACEMAKER     Last year per pt.(can't remember date)   REPLACEMENT TOTAL KNEE Right 08/2020    OB History     Gravida  3   Para  3   Term  3   Preterm      AB      Living         SAB      IAB      Ectopic      Multiple      Live Births               Home Medications    Prior to Admission medications   Medication Sig Start Date End Date Taking? Authorizing Provider  acetaminophen  (TYLENOL ) 500 MG tablet Take 1,000 mg by mouth 2 (two)  times daily as needed for moderate pain or headache.    [provider]  acyclovir  (ZOVIRAX ) 400 MG tablet TAKE ONE TABLET BY MOUTH TWICE DAILY 07/04/23   Paulett Boros, MD  B Complex-Folic Acid  (B COMPLEX VITAMINS, W/ FA,) CAPS Take 1 capsule by mouth daily. 09/09/21   [provider]  busPIRone  (BUSPAR ) 5 MG tablet Take 5 mg by mouth daily.    Craige Dixon, MD  Calcium Carbonate-Vitamin D  (CALCIUM 600+D PO) Take 1 tablet by mouth 2 (two) times daily.    [provider]  chlorthalidone (HYGROTON) 25 MG tablet Take 25 mg by mouth daily. 06/04/22   [provider]  cholecalciferol (VITAMIN D3) 10 MCG (400 UNIT) TABS tablet Take 400 Units by mouth daily. 09/30/22   [provider]  clindamycin  (CLEOCIN ) 150 MG capsule Take 1 capsule (150 mg total) by mouth 3 (three) times daily. 08/03/23   Towanda Fret, MD  diclofenac  Sodium (VOLTAREN ) 1 % GEL Apply 4 g topically 4 (four) times daily. 11/19/21   Zarwolo, [name], FNP  FEROSUL 325 (65 Fe) MG tablet Take 325 mg by mouth daily.  07/28/23   [provider]  fluticasone  (FLONASE ) 50 MCG/ACT nasal spray USE 2 SPRAYS IN EACH NOSTRIL ONCE DAILY 06/21/23   Zarwolo, [name], FNP  gabapentin (NEURONTIN) 100 MG capsule Take 100 mg by mouth 3 (three) times daily. 05/07/19   [provider]  lenalidomide  (REVLIMID ) 10 MG capsule Take 1 capsule (10 mg total) by mouth daily. 21 days on, 7 days off every 28 days 09/12/23   Katragadda, Sreedhar, MD  levocetirizine (XYZAL ) 5 MG tablet TAKE ONE TABLET BY MOUTH IN THE EVENING 08/18/23   Zarwolo, [name], FNP  lidocaine  (HM LIDOCAINE  PATCH) 4 % Place 1 patch onto the skin daily. 04/12/23   Felicie Horning, PA-C  losartan  (COZAAR ) 100 MG tablet TAKE ONE TABLET BY MOUTH ONCE DAILY 08/18/23   Zarwolo, [name], FNP  magnesium  oxide (MAG-OX) 400 (240 Mg) MG tablet TAKE ONE TABLET BY MOUTH TWICE DAILY 08/18/23   Paulett Boros, MD  metoprolol  tartrate (LOPRESSOR ) 50 MG tablet Take 1 tablet (50 mg total) by mouth 2 (two) times daily. 10/07/21   Paseda, Folashade R, FNP  montelukast  (SINGULAIR ) 10 MG tablet TAKE ONE TABLET BY MOUTH AT BEDTIME 06/01/23   Zarwolo, [name], FNP  MYRBETRIQ  50 MG TB24 tablet TAKE ONE TABLET BY MOUTH ONCE DAILY 06/01/23   Zarwolo, [name], FNP  nitroGLYCERIN (NITROSTAT) 0.4 MG SL tablet Place 0.4 mg under the tongue every 5 (five) minutes as needed for chest pain.    [provider]  pantoprazole  (PROTONIX ) 40 MG tablet TAKE ONE TABLET BY MOUTH TWICE DAILY 05/03/23   Zarwolo, [name], FNP  potassium chloride  SA (KLOR-CON  M) 20 MEQ tablet Take 1 tablet (20 mEq total) by mouth 2 (two) times daily. 09/04/23   Katragadda, Sreedhar, MD  primidone (MYSOLINE) 50 MG tablet TAKE ONE TABLET BY MOUTH TWICE DAILY MORNING AND BEDTIME 06/05/23   Zarwolo, [name], FNP  Probiotic Product (GNP PROBIOTIC COLON SUPPORT) CAPS Take 1 capsule by mouth daily. 07/08/21   [provider]  sertraline  (ZOLOFT ) 50 MG tablet Take 1 tablet (50 mg total) by mouth daily. 09/19/23    Zarwolo, [name], FNP  topiramate (TOPAMAX) 25 MG tablet Take 25 mg by mouth 2 (two) times daily. 01/06/23   [provider]  warfarin (COUMADIN) 6 MG tablet Take 6 mg by mouth at bedtime. 08/04/23   [provider]  Family History Family History  Problem Relation Age of Onset   Colon polyps Neg Hx    Colon cancer Neg Hx     Social History Social History   Tobacco Use   Smoking status: Never    Passive exposure: Never   Smokeless tobacco: Never   Tobacco comments:    Never smoked   Vaping Use   Vaping status: Never Used  Substance Use Topics   Alcohol use: Not Currently    Comment: occ wine   Drug use: No     Allergies   Amoxicillin-pot clavulanate, Clarithromycin, Erythromycin, Lisinopril, and Amoxicillin   Review of Systems Review of Systems Per HPI  Physical Exam Triage Vital Signs ED Triage Vitals  Encounter Vitals Group     BP 09/19/23 1531 118/77     Systolic BP Percentile --      Diastolic BP Percentile --      Pulse Rate 09/19/23 1531 63     Resp 09/19/23 1531 16     Temp 09/19/23 1531 98.2 F (36.8 C)     Temp Source 09/19/23 1531 Oral     SpO2 09/19/23 1531 98 %     Weight --      Height --      Head Circumference --      Peak Flow --      Pain Score 09/19/23 1529 0     Pain Loc --      Pain Education --      Exclude from Growth Chart --    No data found.  Updated Vital Signs BP 118/77 (BP Location: Right Arm)   Pulse 63   Temp 98.2 F (36.8 C) (Oral)   Resp 16   SpO2 98%   Visual Acuity Right Eye Distance:   Left Eye Distance:   Bilateral Distance:    Right Eye Near:   Left Eye Near:    Bilateral Near:     Physical Exam Vitals and nursing note reviewed.  Constitutional:      General: She is not in acute distress.    Appearance: Normal appearance. She is not toxic-appearing.  HENT:     Head: Normocephalic and atraumatic.     Right Ear: External ear normal.     Left Ear: External ear normal.      Mouth/Throat:     Mouth: Mucous membranes are moist.     Pharynx: Oropharynx is clear.  Cardiovascular:     Rate and Rhythm: Normal rate.  Pulmonary:     Effort: Pulmonary effort is normal. No respiratory distress.  Musculoskeletal:     Cervical back: Normal range of motion.  Lymphadenopathy:     Cervical: No cervical adenopathy.  Skin:    General: Skin is warm and dry.     Capillary Refill: Capillary refill takes less than 2 seconds.     Coloration: Skin is not jaundiced or pale.     Findings: No erythema.  Neurological:     Mental Status: She is alert and oriented to person, place, and time.  Psychiatric:        Behavior: Behavior is cooperative.      UC Treatments / Results  Labs (all labs ordered are listed, but only abnormal results are displayed) Labs Reviewed - No data to display  EKG   Radiology No results found.  Procedures Procedures (including critical care time)  Medications Ordered in UC Medications - No data to display  Initial Impression / Assessment and Plan /  UC Course  I have reviewed the triage vital signs and the nursing notes.  Pertinent labs & imaging results that were available during my care of the patient were reviewed by me and considered in my medical decision making (see chart for details).   Patient is well-appearing, normotensive, afebrile, not tachycardic, not tachypneic, oxygenating well on room air.    1. Stress at home Vitals are stable today and patient is well-appearing We discussed that she needs to reach out to her primary care provider regarding stress in the home and obtaining potential help with caring for the individuals who live with her I also discussed with her that she is free to go to the hospital anytime, however typically hospital admission is indicated for people who are medically sick.  Patient verbalizes understanding of this. Patient denies suicidal homicidal ideation today, has no plan or intent to hurt or harm  herself or kill herself today Recommended close follow-up with primary care provider  The patient was given the opportunity to ask questions.  All questions answered to their satisfaction.  The patient is in agreement to this plan.   Final Clinical Impressions(s) / UC Diagnoses   Final diagnoses:  Stress at home     Discharge Instructions      Please follow-up with your primary care provider regarding your concerns about the stress at home if you qualify for home health services.    ED Prescriptions   None    PDMP not reviewed this encounter.   Wilhemena Harbour, NP 09/19/23 [phone]

## 2023-09-19 NOTE — Telephone Encounter (Signed)
 A prescription for Zoloft  50 mg daily has been sent to her pharmacy.

## 2023-09-20 ENCOUNTER — Other Ambulatory Visit: Payer: Self-pay | Admitting: Family Medicine

## 2023-09-20 ENCOUNTER — Other Ambulatory Visit: Payer: Self-pay

## 2023-09-20 ENCOUNTER — Telehealth: Payer: Self-pay | Admitting: *Deleted

## 2023-09-20 DIAGNOSIS — C9000 Multiple myeloma not having achieved remission: Secondary | ICD-10-CM

## 2023-09-20 DIAGNOSIS — J301 Allergic rhinitis due to pollen: Secondary | ICD-10-CM

## 2023-09-20 DIAGNOSIS — R32 Unspecified urinary incontinence: Secondary | ICD-10-CM

## 2023-09-20 NOTE — Telephone Encounter (Signed)
 Received VM from patient stating that she was in the ER last evening and is in need of some assistance at home, as her husband suffers from Dementia.  Attempted to contact her and 2 messages left.  Consulted with SW Alvera Austria, LSW who also attempted to reach out to she and her daughter without success.  Will continue to follow up.

## 2023-09-21 ENCOUNTER — Inpatient Hospital Stay

## 2023-09-21 ENCOUNTER — Inpatient Hospital Stay: Attending: Hematology

## 2023-09-21 ENCOUNTER — Inpatient Hospital Stay: Admitting: Licensed Clinical Social Worker

## 2023-09-21 ENCOUNTER — Inpatient Hospital Stay: Admitting: Hematology

## 2023-09-21 VITALS — BP 99/64 | HR 63 | Temp 96.6°F | Resp 18 | Wt 133.3 lb

## 2023-09-21 DIAGNOSIS — Z79899 Other long term (current) drug therapy: Secondary | ICD-10-CM | POA: Insufficient documentation

## 2023-09-21 DIAGNOSIS — Z7901 Long term (current) use of anticoagulants: Secondary | ICD-10-CM | POA: Insufficient documentation

## 2023-09-21 DIAGNOSIS — Z5112 Encounter for antineoplastic immunotherapy: Secondary | ICD-10-CM | POA: Diagnosis present

## 2023-09-21 DIAGNOSIS — Z86711 Personal history of pulmonary embolism: Secondary | ICD-10-CM | POA: Insufficient documentation

## 2023-09-21 DIAGNOSIS — C9000 Multiple myeloma not having achieved remission: Secondary | ICD-10-CM

## 2023-09-21 DIAGNOSIS — R634 Abnormal weight loss: Secondary | ICD-10-CM

## 2023-09-21 DIAGNOSIS — G629 Polyneuropathy, unspecified: Secondary | ICD-10-CM | POA: Insufficient documentation

## 2023-09-21 LAB — COMPREHENSIVE METABOLIC PANEL WITH GFR
ALT: 20 U/L (ref 0–44)
AST: 24 U/L (ref 15–41)
Albumin: 3.9 g/dL (ref 3.5–5.0)
Alkaline Phosphatase: 45 U/L (ref 38–126)
Anion gap: 12 (ref 5–15)
BUN: 16 mg/dL (ref 8–23)
CO2: 21 mmol/L — ABNORMAL LOW (ref 22–32)
Calcium: 8.9 mg/dL (ref 8.9–10.3)
Chloride: 88 mmol/L — ABNORMAL LOW (ref 98–111)
Creatinine, Ser: 0.67 mg/dL (ref 0.44–1.00)
GFR, Estimated: >60 mL/min (ref >60)
Glucose, Bld: 111 mg/dL — ABNORMAL HIGH (ref 70–99)
Potassium: 3 mmol/L — ABNORMAL LOW (ref 3.5–5.1)
Sodium: 121 mmol/L — ABNORMAL LOW (ref 135–145)
Total Bilirubin: 1 mg/dL (ref 0.0–1.2)
Total Protein: 6.5 g/dL (ref 6.5–8.1)

## 2023-09-21 LAB — MAGNESIUM: Magnesium: 2.1 mg/dL (ref 1.7–2.4)

## 2023-09-21 LAB — CBC WITH DIFFERENTIAL/PLATELET
Abs Immature Granulocytes: 0 10*3/uL (ref 0.00–0.07)
Basophils Absolute: 0 10*3/uL (ref 0.0–0.1)
Basophils Relative: 1 %
Eosinophils Absolute: 0 10*3/uL (ref 0.0–0.5)
Eosinophils Relative: 1 %
HCT: 35.5 % — ABNORMAL LOW (ref 36.0–46.0)
Hemoglobin: 12.7 g/dL (ref 12.0–15.0)
Immature Granulocytes: 0 %
Lymphocytes Relative: 23 %
Lymphs Abs: 0.8 10*3/uL (ref 0.7–4.0)
MCH: 35.4 pg — ABNORMAL HIGH (ref 26.0–34.0)
MCHC: 35.8 g/dL (ref 30.0–36.0)
MCV: 98.9 fL (ref 80.0–100.0)
Monocytes Absolute: 0.7 10*3/uL (ref 0.1–1.0)
Monocytes Relative: 20 %
Neutro Abs: 1.9 10*3/uL (ref 1.7–7.7)
Neutrophils Relative %: 55 %
Platelets: 163 10*3/uL (ref 150–400)
RBC: 3.59 MIL/uL — ABNORMAL LOW (ref 3.87–5.11)
RDW: 12.6 % (ref 11.5–15.5)
WBC: 3.4 10*3/uL — ABNORMAL LOW (ref 4.0–10.5)
nRBC: 0 % (ref 0.0–0.2)

## 2023-09-21 MED ORDER — GABAPENTIN 100 MG PO CAPS: 100.0000 mg | ORAL_CAPSULE | Freq: Three times a day (TID) | ORAL | 2 refills | Status: DC

## 2023-09-21 MED ORDER — SODIUM CHLORIDE 0.9 % IV SOLN: INTRAVENOUS | Status: DC

## 2023-09-21 MED ORDER — POTASSIUM CHLORIDE CRYS ER 20 MEQ PO TBCR
40.0000 meq | EXTENDED_RELEASE_TABLET | Freq: Once | ORAL | Status: AC
  Administered 2023-09-21: 40 meq via ORAL
  Filled 2023-09-21: qty 2

## 2023-09-21 MED ORDER — BORTEZOMIB CHEMO SQ INJECTION 3.5 MG (2.5MG/ML)
0.7000 mg/m2 | Freq: Once | INTRAMUSCULAR | Status: AC
  Administered 2023-09-21: 1.25 mg via SUBCUTANEOUS
  Filled 2023-09-21: qty 0.5

## 2023-09-21 MED ORDER — PROCHLORPERAZINE MALEATE 10 MG PO TABS
10.0000 mg | ORAL_TABLET | Freq: Four times a day (QID) | ORAL | Status: DC | PRN
  Administered 2023-09-21: 10 mg via ORAL
  Filled 2023-09-21: qty 1

## 2023-09-21 NOTE — Progress Notes (Signed)
 [facility] CSW Progress Note  Clinical Child psychotherapist  spoke w/ pt's daughter Holland Lundborg regarding recent appearance in the ED due to stress at home.  Holland Lundborg reports she saw pt a couple of days ago, but will check in with her today.  Pt's son is reportedly escorting her to her infusion appt today.  CSW suggested the family explore CAP services possibly for both pt and her spouse as it would give them assistance in the home daily if one of them qualifies.  [name] provided w/ the contact number for [facility] on Aging to explore the application process.  Per Holland Lundborg pt has too much money in her bank account to qualify for Medicaid and is not open to assisted living for herself.  Pt reportedly talked to the family about placement for their father, but seems to go back and forth regarding what she would like to do.  CSW to remain available as appropriate to provide support.        Quenton Bruns, LCSW Clinical Social Worker [facility]

## 2023-09-21 NOTE — Patient Instructions (Signed)
 [facility] - A DEPT OF [facility]  Discharge Instructions: Thank you for choosing [facility] to provide your oncology and hematology care.  If you have a lab appointment with the Cancer Center - please note that after April 8th, 2024, all labs will be drawn in the cancer center.  You do not have to check in or register with the main entrance as you have in the past but will complete your check-in in the cancer center.  Wear comfortable clothing and clothing appropriate for easy access to any Portacath or PICC line.   We strive to give you quality time with your provider. You may need to reschedule your appointment if you arrive late (15 or more minutes).  Arriving late affects you and other patients whose appointments are after yours.  Also, if you miss three or more appointments without notifying the office, you may be dismissed from the clinic at the provider's discretion.      For prescription refill requests, have your pharmacy contact our office and allow 72 hours for refills to be completed.    Today you received the following chemotherapy and/or immunotherapy agents Velcade , 500ml Normal saline and 40 mEq of potassium chloride .   To help prevent nausea and vomiting after your treatment, we encourage you to take your nausea medication as directed.  BELOW ARE SYMPTOMS THAT SHOULD BE REPORTED IMMEDIATELY: *FEVER GREATER THAN 100.4 F (38 C) OR HIGHER *CHILLS OR SWEATING *NAUSEA AND VOMITING THAT IS NOT CONTROLLED WITH YOUR NAUSEA MEDICATION *UNUSUAL SHORTNESS OF BREATH *UNUSUAL BRUISING OR BLEEDING *URINARY PROBLEMS (pain or burning when urinating, or frequent urination) *BOWEL PROBLEMS (unusual diarrhea, constipation, pain near the anus) TENDERNESS IN MOUTH AND THROAT WITH OR WITHOUT PRESENCE OF ULCERS (sore throat, sores in mouth, or a toothache) UNUSUAL RASH, SWELLING OR PAIN  UNUSUAL VAGINAL DISCHARGE OR ITCHING   Items with *  indicate a potential emergency and should be followed up as soon as possible or go to the Emergency Department if any problems should occur.  Please show the CHEMOTHERAPY ALERT CARD or IMMUNOTHERAPY ALERT CARD at check-in to the Emergency Department and triage nurse.  Should you have questions after your visit or need to cancel or reschedule your appointment, please contact [facility] [place] - A DEPT OF [facility] [phone]  and follow the prompts.  Office hours are 8:00 a.m. to 4:30 p.m. Monday - Friday. Please note that voicemails left after 4:00 p.m. may not be returned until the following business day.  We are closed weekends and major holidays. You have access to a nurse at all times for urgent questions. Please call the main number to the clinic [phone] and follow the prompts.  For any non-urgent questions, you may also contact your provider using MyChart. We now offer e-Visits for anyone 14 and older to request care online for non-urgent symptoms. For details visit [email or web address].   Also download the MyChart app! Go to the app store, search "MyChart", open the app, select [facility], and log in with your MyChart username and password.

## 2023-09-21 NOTE — Progress Notes (Signed)
 [facility] [address], [place] [address]    Clinic Day:  09/21/2023  Referring physician: Zarwolo, [name], FNP  Patient Care Team: Rogers, [name], FNP as PCP - General (Family Medicine) Courtney Boros, MD as Medical Oncologist (Oncology) Rogers, Courtney G, RN as Oncology Nurse Navigator (Oncology) Courtney Greening, DO as Consulting Physician (Internal Medicine) Courtney Rogers, Courtney Pleasant, LCSW as [facility] Care Management (Licensed Clinical Social Worker)   ASSESSMENT & PLAN:   Assessment: 1.  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  Increased number of atypical plasma cells present 36% of all cell lines.  Plasma cells are kappa light chain restricted. -Unfortunately her specimen has reached cytogenetics and FISH lab week later due to bad weather and no viable plasma cells. -PET scan on 07/09/2019 showed no findings of active myeloma. -24-hour urine shows total protein 59 mg.  Urine immunofixation shows IgA kappa monoclonal protein.  LDH is 184. -Based on [facility] criteria she has high risk with about 45-50% probability of progression to myeloma in the next 2 years. -CTAP on 01/07/2020 shows acute superior endplate burst fracture of L1.  Mild associated paravertebral soft tissue thickening.  No other bone abnormalities. -Bone density test on 02/07/2020 shows T score -0.5. - Bone marrow biopsy on 07/12/2021: Hypercellular with increased number of atypical plasma cells representing 30% of cells, displaying kappa light chain restriction. - FISH panel: Gain of 1 q. (high risk), T p53 deletion (high risk), monosomy 13 (standard risk) - Cytogenetics: Complex karyotype. - PET scan (06/24/2021): New hypermetabolic bone lesions involving left anterior iliac crest, left mid humerus, right superior pubic ramus, bilateral mid femurs, left calvarium.  Largest lytic lesion in  the left iliac crest measuring 2.9 x 2.3 cm. - 2D echo on 07/29/2021: EF 50-55%.  There is distal septal and inferior apical hypokinesis.  (Carfilzomib based regimen was put on hold due to echo findings) - Dara VRD 6 cycles from 08/10/2021 through 11/30/2021 - Myeloma panel 12/07/2021, DIRA test was negative, M spike 0.2 g and light chain ratio normal. - Maintenance Velcade  every 2 weeks and Revlimid  3 weeks on 1 week off Started on 12/20/2021    2.  Social/family history: - She uses walker to ambulate since her right knee replacement leading to stiffness.  She lives with older sister and husband who has dementia. - She is independent of ADLs and IADLs.  She also drives     Plan: 1.  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 - She has tolerated it fine.  She is also continuing Velcade  0.7 mg/m every 2 weeks. - We reviewed labs today: Sodium is low at 121.  CBC was grossly normal.  She may proceed with Velcade  today.  Will give her 500 mL of normal saline today.  She was told to increase sodium intake.  She will continue Velcade  in 2 weeks.  RTC 4 weeks for follow-up. - We have sent myeloma panel today.  Will follow-up on it at next visit.    2.  Peripheral neuropathy: - She has numbness in the feet.  Digit #5 on both feet hurt occasionally. - Will start her on gabapentin  100 mg 3 times daily.   3.  Pulmonary embolism (  2015): - Continue Coumadin indefinitely.  No bleeding issues.  4.  ID prophylaxis: - Continue acyclovir  twice daily for shingles prophylaxis.  5.  Myeloma bone disease: - Continue denosumab  monthly.  Calcium is 8.9.  6.  Right knee pain: - Continue hydrocodone  5/325 daily as needed.    Orders Placed This Encounter  Procedures   Kappa/lambda light chains    Standing Status:   Future    Expected Date:   09/21/2023     Expiration Date:   09/20/2024   Protein electrophoresis, serum    Standing Status:   Future    Expected Date:   09/21/2023    Expiration Date:   09/20/2024   CBC with Differential    Standing Status:   Future    Expected Date:   10/12/2023    Expiration Date:   10/11/2024   Comprehensive metabolic panel    Standing Status:   Future    Expected Date:   10/12/2023    Expiration Date:   10/11/2024   CBC with Differential    Standing Status:   Future    Expected Date:   11/09/2023    Expiration Date:   11/08/2024   Comprehensive metabolic panel    Standing Status:   Future    Expected Date:   11/09/2023    Expiration Date:   11/08/2024   CBC with Differential    Standing Status:   Future    Expected Date:   12/07/2023    Expiration Date:   12/06/2024   Comprehensive metabolic panel    Standing Status:   Future    Expected Date:   12/07/2023    Expiration Date:   12/06/2024   CBC with Differential    Standing Status:   Future    Expected Date:   01/04/2024    Expiration Date:   01/03/2025   Comprehensive metabolic panel    Standing Status:   Future    Expected Date:   01/04/2024    Expiration Date:   01/03/2025     [name],acting as a scribe for Courtney Boros, MD.,have documented all relevant documentation on the behalf of Courtney Boros, MD,as directed by  Courtney Boros, MD while in the presence of Courtney Boros, MD.  I, Courtney Boros MD, have reviewed the above documentation for accuracy and completeness, and I agree with the above.    Courtney Boros, MD   5/1/20251:07 PM  CHIEF COMPLAINT:   Diagnosis: multiple myeloma    Cancer Staging  No matching staging information was found for the patient.    Prior Therapy: Dara VRD 6 cycles completed on 11/30/2021   Current Therapy:  Maintenance Velcade  every 2 weeks, and Revlimid  3 weeks on/1 week off    HISTORY OF PRESENT ILLNESS:   Oncology History  IgA myeloma (HCC)  05/06/2021 Initial  Diagnosis   IgA myeloma (HCC)   08/10/2021 - 01/19/2022 Chemotherapy   Patient is on Treatment Plan : MYELOMA NEWLY DIAGNOSED TRANSPLANT CANDIDATE DaraVRd (Daratumumab  SQ) q21d x 6 Cycles (Induction/Consolidation)     08/10/2021 -  Chemotherapy   Patient is on Treatment Plan : MYELOMA NEWLY DIAGNOSED TRANSPLANT CANDIDATE Velcade  D1 and D 15 q 28 days        INTERVAL HISTORY:   Courtney Rogers is a 73 y.o. female presenting to clinic today for follow up of multiple myeloma. She was last seen by me on 08/17/23.  Since her last visit, she presented to the ED on 08/21/23 for right  flank pain and on 09/19/23 for stress at home.   Today, she states that she is doing well overall. Her appetite level is at 75%. Her energy level is at 0%.  She is accompanied by a family member.   Jehilyn reports increased tiredness. She has requested an increased dosage of her depression medication.   She restarted gabapentin  2-3 days ago after discontinuing in March 2025 due to numbness in the fingers, toes and bilateral feet. She also notes crustiness under the fingernails and neuropathy worse in the right than left foot. She states when rubbing her hands together, it feels as if her hands are very rough.   PAST MEDICAL HISTORY:   Past Medical History: Past Medical History:  Diagnosis Date   Acute myocardial infarction [facility]) 2009   CAD/no stent medically managed   Anxiety disorder    CAD (coronary artery disease)    Cancer (HCC)    multiple myeloma   Hyperlipidemia    Hypertension    IBS (irritable bowel syndrome)    Non-ulcer dyspepsia 04/30/2009   Qualifier: Diagnosis of  By: Nolene Baumgarten MD, Sandi L    Overactive bladder    PE (pulmonary embolism) 10/2012   left lung   Presence of permanent cardiac pacemaker    Sleep apnea     Surgical History: Past Surgical History:  Procedure Laterality Date   ABDOMINAL HYSTERECTOMY     BSO secondary to cyst     CARDIAC CATHETERIZATION     COLONOSCOPY  12/2009   Dr.  Myrlene Asper, propofol, normal. Next TCS 12/2019   COLONOSCOPY N/A 05/22/2017   Examined portion ileum was normal, significant looping of colon, external hemorrhoids and rectal bleeding due to internal hemorrhoids, mild diverticulosis procedure: COLONOSCOPY;  Surgeon: Alyce Jubilee, MD;  [facility];  Service: Endoscopy;  Laterality: N/A;  10:30am   ESOPHAGOGASTRODUODENOSCOPY  05/11/2009   schatzki ring/small hiatal hernia/path:gastritis   ESOPHAGOGASTRODUODENOSCOPY N/A 05/22/2017   Multiple gastric polyps with biopsy benign fundic gland polyp, small bowel biopsy negative for celiac, gastric biopsy with mild gastritis but no H. pylori procedure: ESOPHAGOGASTRODUODENOSCOPY (EGD);  Surgeon: Alyce Jubilee, MD;  Location: [facility];  Service: Endoscopy;  Laterality: N/A;   ESOPHAGOGASTRODUODENOSCOPY (EGD) WITH ESOPHAGEAL DILATION N/A 04/01/2013   [id number] at the gastroesophageal juction/multiple small polyps/mild gastritis   GIVENS CAPSULE STUDY N/A 06/07/2017   Frequent gastric erosions, normal small bowel mucosa.  Procedure: GIVENS CAPSULE STUDY;  Surgeon: Alyce Jubilee, MD;  Location: [facility];  Service: Endoscopy;  Laterality: N/A;  7:30am   INSERT / REPLACE / REMOVE PACEMAKER     Last year per pt.(can't remember date)   REPLACEMENT TOTAL KNEE Right 08/2020    Social History: Social History   Socioeconomic History   Marital status: Married    Spouse name: Doctor, general practice   Number of children: 3   Years of education: 16   Highest education level: Master's degree (e.g., MA, MS, MEng, MEd, MSW, MBA)  Occupational History   Not on file  Tobacco Use   Smoking status: Never    Passive exposure: Never   Smokeless tobacco: Never   Tobacco comments:    Never smoked   Vaping Use   Vaping status: Never Used  Substance and Sexual Activity   Alcohol use: Not Currently    Comment: occ wine   Drug use: No   Sexual activity: Not Currently  Other Topics Concern   Not on  file  Social History Narrative  Lives with husband-51 years 952 in Aug 2021   Daughter is close by, 2 sons live further away   One IN [place],ONE IN [place], ONE BESIDE HER.        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   Enjoys: reading, young adult      Diet: eats all food groups    Caffeine: coffee 1, tea daily soda-1 daily   Water : 1-2 cups      Wears seat belt    Does not use phone while driving   Smoke detectors at home    Licensed conveyancer -locked up      Left handed   One story home   Drinks caffeine   Social Drivers of Health   Financial Resource Strain: Low Risk  (06/26/2023)   Overall Financial Resource Strain (CARDIA)    Difficulty of Paying Living Expenses: Not hard at all  Food Insecurity: No Food Insecurity (06/26/2023)   Hunger Vital Sign    Worried About Running Out of Food in the Last Year: Never true    Ran Out of Food in the Last Year: Never true  Transportation Needs: No Transportation Needs (06/26/2023)   PRAPARE - Administrator, Civil Service (Medical): No    Lack of Transportation (Non-Medical): No  Physical Activity: Insufficiently Active (06/26/2023)   Exercise Vital Sign    Days of Exercise per Week: 2 days    Minutes of Exercise per Session: 20 min  Stress: Stress Concern Present (06/26/2023)   [employer] of Occupational Health - Occupational Stress Questionnaire    Feeling of Stress : To some extent  Social Connections: Socially Integrated (06/26/2023)   Social Connection and Isolation Panel [NHANES]    Frequency of Communication with Friends and Family: Twice a week    Frequency of Social Gatherings with Friends and Family: Once a week    Attends Religious Services: 1 to 4 times per year    Active Member of [employer] or Organizations: Yes    Attends Banker Meetings: 1 to 4 times per year    Marital Status: Married  Catering manager Violence: Not At Risk (12/21/2022)   Humiliation, Afraid, Rape,  and Kick questionnaire    Fear of Current or Ex-Partner: No    Emotionally Abused: No    Physically Abused: No    Sexually Abused: No    Family History: Family History  Problem Relation Age of Onset   Colon polyps Neg Hx    Colon cancer Neg Hx     Current Medications:  Current Outpatient Medications:    acetaminophen  (TYLENOL ) 500 MG tablet, Take 1,000 mg by mouth 2 (two) times daily as needed for moderate pain or headache., Disp: , Rfl:    acyclovir  (ZOVIRAX ) 400 MG tablet, TAKE ONE TABLET BY MOUTH TWICE DAILY, Disp: 60 tablet, Rfl: 6   B Complex-Folic Acid  (B COMPLEX VITAMINS, W/ FA,) CAPS, Take 1 capsule by mouth daily., Disp: , Rfl:    busPIRone  (BUSPAR ) 5 MG tablet, Take 5 mg by mouth daily., Disp: , Rfl:    Calcium Carbonate-Vitamin D  (CALCIUM 600+D PO), Take 1 tablet by mouth 2 (two) times daily., Disp: , Rfl:    chlorthalidone (HYGROTON) 25 MG tablet, Take 25 mg by mouth daily., Disp: , Rfl:    cholecalciferol (VITAMIN D3) 10 MCG (400 UNIT) TABS tablet, Take 400 Units by mouth daily., Disp: , Rfl:    clindamycin  (CLEOCIN ) 150 MG capsule, Take 1 capsule (  150 mg total) by mouth 3 (three) times daily., Disp: 21 capsule, Rfl: 0   diclofenac  Sodium (VOLTAREN ) 1 % GEL, Apply 4 g topically 4 (four) times daily., Disp: 50 g, Rfl: 0   FEROSUL 325 (65 Fe) MG tablet, Take 325 mg by mouth daily., Disp: , Rfl:    fluticasone  (FLONASE ) 50 MCG/ACT nasal spray, USE 2 SPRAYS IN EACH NOSTRIL ONCE DAILY, Disp: 16 g, Rfl: 0   lenalidomide  (REVLIMID ) 10 MG capsule, Take 1 capsule (10 mg total) by mouth daily. 21 days on, 7 days off every 28 days, Disp: 21 capsule, Rfl: 0   levocetirizine (XYZAL ) 5 MG tablet, TAKE ONE TABLET BY MOUTH IN THE EVENING, Disp: 30 tablet, Rfl: 3   lidocaine  (HM LIDOCAINE  PATCH) 4 %, Place 1 patch onto the skin daily., Disp: 14 patch, Rfl: 0   losartan  (COZAAR ) 100 MG tablet, TAKE ONE TABLET BY MOUTH ONCE DAILY, Disp: 30 tablet, Rfl: 3   magnesium  oxide (MAG-OX) 400 (240  Mg) MG tablet, TAKE ONE TABLET BY MOUTH TWICE DAILY, Disp: 60 tablet, Rfl: 4   metoprolol  tartrate (LOPRESSOR ) 50 MG tablet, Take 1 tablet (50 mg total) by mouth 2 (two) times daily., Disp: 180 tablet, Rfl: 1   montelukast  (SINGULAIR ) 10 MG tablet, TAKE ONE TABLET BY MOUTH AT BEDTIME, Disp: 30 tablet, Rfl: 3   MYRBETRIQ  50 MG TB24 tablet, TAKE ONE TABLET BY MOUTH ONCE DAILY, Disp: 30 tablet, Rfl: 3   nitroGLYCERIN (NITROSTAT) 0.4 MG SL tablet, Place 0.4 mg under the tongue every 5 (five) minutes as needed for chest pain., Disp: , Rfl:    pantoprazole  (PROTONIX ) 40 MG tablet, TAKE ONE TABLET BY MOUTH TWICE DAILY, Disp: 90 tablet, Rfl: 3   potassium chloride  SA (KLOR-CON  M) 20 MEQ tablet, Take 1 tablet (20 mEq total) by mouth 2 (two) times daily., Disp: 60 tablet, Rfl: 3   PREMARIN vaginal cream, Place vaginally., Disp: , Rfl:    primidone (MYSOLINE) 50 MG tablet, TAKE ONE TABLET BY MOUTH TWICE DAILY MORNING AND BEDTIME, Disp: 60 tablet, Rfl: 5   Probiotic Product (GNP PROBIOTIC COLON SUPPORT) CAPS, Take 1 capsule by mouth daily., Disp: , Rfl:    sertraline  (ZOLOFT ) 50 MG tablet, Take 1 tablet (50 mg total) by mouth daily., Disp: 30 tablet, Rfl: 3   simvastatin  (ZOCOR ) 20 MG tablet, Take 20 mg by mouth at bedtime., Disp: , Rfl:    topiramate (TOPAMAX) 25 MG tablet, Take 25 mg by mouth 2 (two) times daily., Disp: , Rfl:    warfarin (COUMADIN) 6 MG tablet, Take 6 mg by mouth at bedtime., Disp: , Rfl:    gabapentin  (NEURONTIN ) 100 MG capsule, Take 1 capsule (100 mg total) by mouth 3 (three) times daily., Disp: 90 capsule, Rfl: 2 No current facility-administered medications for this visit.  Facility-Administered Medications Ordered in Other Visits:    0.9 %  sodium chloride  infusion, , Intravenous, Continuous, Courtney Boros, MD, Last Rate: 500 mL/hr at 09/21/23 1238, New Bag at 09/21/23 1238   bortezomib  SQ (VELCADE ) chemo injection (2.5mg /mL concentration) 1.25 mg, 0.7 mg/m2 (Treatment Plan  Recorded), Subcutaneous, Once, Courtney Boros, MD   prochlorperazine  (COMPAZINE ) tablet 10 mg, 10 mg, Oral, Q6H PRN, Gwenda Heiner, MD, 10 mg at 09/21/23 1241   Allergies: Allergies  Allergen Reactions   Amoxicillin-Pot Clavulanate Other (See Comments)   Clarithromycin Other (See Comments)    Stomach problems   Erythromycin    Lisinopril Swelling   Amoxicillin Diarrhea    REVIEW OF  SYSTEMS:   Review of Systems  Constitutional:  Negative for chills, fatigue and fever.  HENT:   Negative for lump/mass, mouth sores, nosebleeds, sore throat and trouble swallowing.   Eyes:  Negative for eye problems.  Respiratory:  Negative for cough and shortness of breath.   Cardiovascular:  Positive for chest pain. Negative for leg swelling and palpitations.  Gastrointestinal:  Positive for diarrhea. Negative for abdominal pain, constipation, nausea and vomiting.  Genitourinary:  Negative for bladder incontinence, difficulty urinating, dysuria, frequency, hematuria and nocturia.   Musculoskeletal:  Positive for flank pain (right, 5/10 severity). Negative for arthralgias, back pain, myalgias and neck pain.  Skin:  Negative for itching and rash.  Neurological:  Positive for numbness (in hands). Negative for dizziness and headaches.  Hematological:  Does not bruise/bleed easily.  Psychiatric/Behavioral:  Positive for confusion and depression. Negative for sleep disturbance and suicidal ideas. The patient is not nervous/anxious.   All other systems reviewed and are negative.    VITALS:   Blood pressure 99/64, pulse 63, temperature (!) 96.6 F (35.9 C), temperature source Tympanic, resp. rate 18, weight 133 lb 4.8 oz (60.5 kg), SpO2 100%.  Wt Readings from Last 3 Encounters:  09/21/23 133 lb 4.8 oz (60.5 kg)  09/14/23 140 lb 12.8 oz (63.9 kg)  09/01/23 135 lb 9.6 oz (61.5 kg)    Body mass index is 23.61 kg/m.  Performance status (ECOG): 1 - Symptomatic but completely  ambulatory  PHYSICAL EXAM:   Physical Exam Vitals and nursing note reviewed. Exam conducted with a chaperone present.  Constitutional:      Appearance: Normal appearance.  Cardiovascular:     Rate and Rhythm: Normal rate and regular rhythm.     Pulses: Normal pulses.     Heart sounds: Normal heart sounds.  Pulmonary:     Effort: Pulmonary effort is normal.     Breath sounds: Normal breath sounds.  Abdominal:     Palpations: Abdomen is soft. There is no hepatomegaly, splenomegaly or mass.     Tenderness: There is no abdominal tenderness.  Musculoskeletal:     Right lower leg: No edema.     Left lower leg: No edema.  Lymphadenopathy:     Cervical: No cervical adenopathy.     Right cervical: No superficial, deep or posterior cervical adenopathy.    Left cervical: No superficial, deep or posterior cervical adenopathy.     Upper Body:     Right upper body: No supraclavicular or axillary adenopathy.     Left upper body: No supraclavicular or axillary adenopathy.  Neurological:     General: No focal deficit present.     Mental Status: She is alert and oriented to person, place, and time.  Psychiatric:        Mood and Affect: Mood normal.        Behavior: Behavior normal.     LABS:      Latest Ref Rng & Units 09/21/2023   10:08 AM 09/14/2023    1:04 PM 09/01/2023    9:18 AM  CBC  WBC 4.0 - 10.5 K/uL 3.4  2.7  3.8   Hemoglobin 12.0 - 15.0 g/dL 09.8  11.9  14.7   Hematocrit 36.0 - 46.0 % 35.5  32.7  35.1   Platelets 150 - 400 K/uL 163  147  122       Latest Ref Rng & Units 09/21/2023   10:08 AM 09/14/2023    1:04 PM 09/01/2023    9:18 AM  CMP  Glucose 70 - 99 mg/dL 161  98  79   BUN 8 - 23 mg/dL 16  14  21    Creatinine 0.44 - 1.00 mg/dL 0.96  0.45  4.09   Sodium 135 - 145 mmol/L 121  124  135   Potassium 3.5 - 5.1 mmol/L 3.0  3.4  2.7   Chloride 98 - 111 mmol/L 88  89  96   CO2 22 - 32 mmol/L 21  25  27    Calcium 8.9 - 10.3 mg/dL 8.9  8.3  9.3   Total Protein 6.5 - 8.1  g/dL 6.5  6.2  6.7   Total Bilirubin 0.0 - 1.2 mg/dL 1.0  0.6  0.6   Alkaline Phos 38 - 126 U/L 45  40  44   AST 15 - 41 U/L 24  25  24    ALT 0 - 44 U/L 20  23  21       No results found for: "CEA1", "CEA" / No results found for: "CEA1", "CEA" No results found for: "PSA1" No results found for: "CAN199" No results found for: "CAN125"  Lab Results  Component Value Date   TOTALPROTELP 6.5 08/10/2023   ALBUMINELP 3.8 08/10/2023   A1GS 0.3 08/10/2023   A2GS 0.6 08/10/2023   BETS 1.0 08/10/2023   GAMS 0.8 08/10/2023   MSPIKE 0.4 (H) 08/10/2023   SPEI Comment 08/10/2023   Lab Results  Component Value Date   TIBC 291 06/09/2021   TIBC 297 01/21/2021   TIBC 302 10/21/2020   FERRITIN 279 06/09/2021   FERRITIN 216 01/21/2021   FERRITIN 274 10/21/2020   IRONPCTSAT 24 06/09/2021   IRONPCTSAT 19 01/21/2021   IRONPCTSAT 28 10/21/2020   Lab Results  Component Value Date   LDH 129 03/02/2022   LDH 121 12/14/2021   LDH 113 11/09/2021     STUDIES:   No results found.

## 2023-09-21 NOTE — Progress Notes (Signed)
 Patient okay for treatment today per Dr. Cheree Cords, orders received for 500mL of normal saline, 40 mEq of Potassium chloride  given per standing orders. Patient tolerated Velcade  injection with no complaints voiced. Lab work reviewed. See MAR for details. Injection site clean and dry with no bruising or swelling noted. Patient stable during and after injection. Band aid applied. VSS. Patient left in satisfactory condition with no s/s of distress noted.

## 2023-09-21 NOTE — Patient Instructions (Signed)

## 2023-09-25 ENCOUNTER — Ambulatory Visit (INDEPENDENT_AMBULATORY_CARE_PROVIDER_SITE_OTHER): Admitting: Family Medicine

## 2023-09-25 ENCOUNTER — Encounter: Payer: Self-pay | Admitting: Family Medicine

## 2023-09-25 VITALS — BP 127/77 | HR 68 | Ht 63.0 in | Wt 134.1 lb

## 2023-09-25 DIAGNOSIS — F339 Major depressive disorder, recurrent, unspecified: Secondary | ICD-10-CM | POA: Diagnosis not present

## 2023-09-25 DIAGNOSIS — F439 Reaction to severe stress, unspecified: Secondary | ICD-10-CM | POA: Insufficient documentation

## 2023-09-25 NOTE — Patient Instructions (Addendum)
 I appreciate the opportunity to provide care to you today!   Follow up- 3 weeks   -continue taking zoloft  50 mg daily   Referrals today-  Case management and behavioral health  Please continue to a heart-healthy diet and increase your physical activities. Try to exercise for at least five days a week.    It was a pleasure to see you and I look forward to continuing to work together on your health and well-being. Please do not hesitate to call the office if you need care or have questions about your care.  In case of emergency, please visit the Emergency Department for urgent care, or contact our clinic at [phone] to schedule an appointment. We're here to help you!   Have a wonderful day and week. With Gratitude, Rahi Chandonnet MSN, FNP-BC

## 2023-09-25 NOTE — Assessment & Plan Note (Signed)
 PHQ-9 Score: 18 - indicating moderately severe depression. The patient was informed that Medicare does not cover a home health aide under current eligibility criteria. She was encouraged to apply for Medicaid, which may offer additional support services including home care assistance. A referral will be placed today to [facility] for talk therapy and VBCI management with a clinical social worker to address emotional distress, caregiver burden, and support needs.The patient is encouraged to continue Zoloft  50 mg daily as previously prescribed. She was advised to report any worsening symptoms, particularly the emergence of suicidal thoughts, ideation, or an active plan.Nonpharmacological interventions to support depression management were also reviewed, including engagement in enjoyable or relaxing activities, use of support networks, mindfulness practices, and establishing a structured daily routine. The patient verbalized understanding of the treatment plan and interventions discussed.

## 2023-09-25 NOTE — Progress Notes (Signed)
 Established Patient Office Visit  Subjective:  Patient ID: Courtney Rogers, female    DOB: [birth year]  Age: 73 y.o. MRN: [id number]  CC:  Chief Complaint  Patient presents with   Medical Management of Chronic Issues    Review Mental Health Meds having lack of focus  Lack of energy x 2 months  Pt. Reports bones hurting  Pt. Request home health aid     HPI Courtney Rogers is a 73 y.o. female reports feeling overwhelmed, fatigued, and emotionally isolated as she serves as the primary caregiver for her husband with dementia and her deaf sister. She expresses sadness and feels that no one truly understands what she is going through. She shared that she has explored placing her husband in a care facility, but insurance coverage is limited to 100 days, after which the cost would be $200 per day out-of-pocket, which she is unable to afford. She also reports that her adult children are unavailable to assist with caregiving due to their own parenting responsibilities. The patient describes persistent feelings of sadness, loneliness, and depression. She denies suicidal ideation or intent, but does acknowledge passive thoughts, stating that she sometimes prays for God to take her so her pain and suffering may end.  Past Medical History:  Diagnosis Date   Acute myocardial infarction [facility]) 2009   CAD/no stent medically managed   Allergy    Anxiety disorder    CAD (coronary artery disease)    Cancer (HCC)    multiple myeloma   CHF (congestive heart failure) (HCC) 782956   Glaucoma 213086   Hyperlipidemia    Hypertension    IBS (irritable bowel syndrome)    Non-ulcer dyspepsia 04/30/2009   Qualifier: Diagnosis of  By: Nolene Baumgarten MD, Sandi L    Overactive bladder    PE (pulmonary embolism) 10/2012   left lung   Presence of permanent cardiac pacemaker    Sleep apnea     Past Surgical History:  Procedure Laterality Date   ABDOMINAL HYSTERECTOMY     BSO secondary to cyst     CARDIAC  CATHETERIZATION     COLONOSCOPY  12/2009   Dr. Myrlene Asper, propofol, normal. Next TCS 12/2019   COLONOSCOPY N/A 05/22/2017   Examined portion ileum was normal, significant looping of colon, external hemorrhoids and rectal bleeding due to internal hemorrhoids, mild diverticulosis procedure: COLONOSCOPY;  Surgeon: Alyce Jubilee, MD;  [facility];  Service: Endoscopy;  Laterality: N/A;  10:30am   ESOPHAGOGASTRODUODENOSCOPY  05/11/2009   schatzki ring/small hiatal hernia/path:gastritis   ESOPHAGOGASTRODUODENOSCOPY N/A 05/22/2017   Multiple gastric polyps with biopsy benign fundic gland polyp, small bowel biopsy negative for celiac, gastric biopsy with mild gastritis but no H. pylori procedure: ESOPHAGOGASTRODUODENOSCOPY (EGD);  Surgeon: Alyce Jubilee, MD;  [facility];  Service: Endoscopy;  Laterality: N/A;   ESOPHAGOGASTRODUODENOSCOPY (EGD) WITH ESOPHAGEAL DILATION N/A 04/01/2013   VHQ:IONGEXBMW at the gastroesophageal juction/multiple small polyps/mild gastritis   GIVENS CAPSULE STUDY N/A 06/07/2017   Frequent gastric erosions, normal small bowel mucosa.  Procedure: GIVENS CAPSULE STUDY;  Surgeon: Alyce Jubilee, MD;  [facility];  Service: Endoscopy;  Laterality: N/A;  7:30am   INSERT / REPLACE / REMOVE PACEMAKER     Last year per pt.(can't remember date)   JOINT REPLACEMENT  [phone]   REPLACEMENT TOTAL KNEE Right 08/2020    Family History  Problem Relation Age of Onset   Anxiety disorder Mother    Cancer Mother  Heart disease Father    Hypertension Father    Colon polyps Neg Hx    Colon cancer Neg Hx     Social History   Socioeconomic History   Marital status: Married    Spouse name: Doctor, general practice   Number of children: 3   Years of education: 16   Highest education level: Master's degree (e.g., MA, MS, MEng, MEd, MSW, MBA)  Occupational History   Not on file  Tobacco Use   Smoking status: Never    Passive exposure: Never   Smokeless tobacco:  Never   Tobacco comments:    Never smoked   Vaping Use   Vaping status: Never Used  Substance and Sexual Activity   Alcohol use: Not Currently    Comment: occ wine   Drug use: No   Sexual activity: Not Currently  Other Topics Concern   Not on file  Social History Narrative      Lives with husband-51 years 950 in Aug 2021   Daughter is close by, 2 sons live further away   One IN [place],ONE IN [place], ONE BESIDE HER.        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   Enjoys: reading, young adult      Diet: eats all food groups    Caffeine: coffee 1, tea daily soda-1 daily   Water : 1-2 cups      Wears seat belt    Does not use phone while driving   Smoke detectors at home    Licensed conveyancer -locked up      Left handed   One story home   Drinks caffeine   Social Drivers of Health   Financial Resource Strain: Low Risk  (06/26/2023)   Overall Financial Resource Strain (CARDIA)    Difficulty of Paying Living Expenses: Not hard at all  Food Insecurity: No Food Insecurity (06/26/2023)   Hunger Vital Sign    Worried About Running Out of Food in the Last Year: Never true    Ran Out of Food in the Last Year: Never true  Transportation Needs: No Transportation Needs (06/26/2023)   PRAPARE - Administrator, Civil Service (Medical): No    Lack of Transportation (Non-Medical): No  Physical Activity: Insufficiently Active (06/26/2023)   Exercise Vital Sign    Days of Exercise per Week: 2 days    Minutes of Exercise per Session: 20 min  Stress: Stress Concern Present (06/26/2023)   [employer] of Occupational Health - Occupational Stress Questionnaire    Feeling of Stress : To some extent  Social Connections: Socially Integrated (06/26/2023)   Social Connection and Isolation Panel [NHANES]    Frequency of Communication with Friends and Family: Twice a week    Frequency of Social Gatherings with Friends and Family: Once a week    Attends Religious  Services: 1 to 4 times per year    Active Member of [employer] or Organizations: Yes    Attends Banker Meetings: 1 to 4 times per year    Marital Status: Married  Catering manager Violence: Not At Risk (12/21/2022)   Humiliation, Afraid, Rape, and Kick questionnaire    Fear of Current or Ex-Partner: No    Emotionally Abused: No    Physically Abused: No    Sexually Abused: No    Outpatient Medications Prior to Visit  Medication Sig Dispense Refill   acetaminophen  (TYLENOL ) 500 MG tablet Take 1,000 mg by mouth 2 (two) times  daily as needed for moderate pain or headache.     acyclovir  (ZOVIRAX ) 400 MG tablet TAKE ONE TABLET BY MOUTH TWICE DAILY 60 tablet 6   B Complex-Folic Acid  (B COMPLEX VITAMINS, W/ FA,) CAPS Take 1 capsule by mouth daily.     busPIRone  (BUSPAR ) 5 MG tablet Take 5 mg by mouth daily.     Calcium Carb-Cholecalciferol 600-10 MG-MCG TABS Take 1 tablet by mouth 2 (two) times daily.     Calcium Carbonate-Vitamin D  (CALCIUM 600+D PO) Take 1 tablet by mouth 2 (two) times daily.     chlorthalidone (HYGROTON) 25 MG tablet Take 25 mg by mouth daily.     cholecalciferol (VITAMIN D3) 10 MCG (400 UNIT) TABS tablet Take 400 Units by mouth daily.     clindamycin  (CLEOCIN ) 150 MG capsule Take 1 capsule (150 mg total) by mouth 3 (three) times daily. 21 capsule 0   diclofenac  Sodium (VOLTAREN ) 1 % GEL Apply 4 g topically 4 (four) times daily. 50 g 0   FEROSUL 325 (65 Fe) MG tablet Take 325 mg by mouth daily.     fluticasone  (FLONASE ) 50 MCG/ACT nasal spray USE 2 SPRAYS IN EACH NOSTRIL ONCE DAILY 16 g 0   gabapentin  (NEURONTIN ) 100 MG capsule Take 1 capsule (100 mg total) by mouth 3 (three) times daily. 90 capsule 2   GNP VITAMIN C 500 MG tablet Take 500 mg by mouth daily.     lenalidomide  (REVLIMID ) 10 MG capsule Take 1 capsule (10 mg total) by mouth daily. 21 days on, 7 days off every 28 days 21 capsule 0   levocetirizine (XYZAL ) 5 MG tablet TAKE ONE TABLET BY MOUTH IN THE EVENING  30 tablet 3   lidocaine  (HM LIDOCAINE  PATCH) 4 % Place 1 patch onto the skin daily. 14 patch 0   losartan  (COZAAR ) 100 MG tablet TAKE ONE TABLET BY MOUTH ONCE DAILY 30 tablet 3   magnesium  oxide (MAG-OX) 400 (240 Mg) MG tablet TAKE ONE TABLET BY MOUTH TWICE DAILY 60 tablet 4   metoprolol  tartrate (LOPRESSOR ) 50 MG tablet Take 1 tablet (50 mg total) by mouth 2 (two) times daily. 180 tablet 1   montelukast  (SINGULAIR ) 10 MG tablet TAKE ONE TABLET BY MOUTH AT BEDTIME 30 tablet 3   MYRBETRIQ  50 MG TB24 tablet TAKE ONE TABLET BY MOUTH ONCE DAILY 30 tablet 3   nitroGLYCERIN (NITROSTAT) 0.4 MG SL tablet Place 0.4 mg under the tongue every 5 (five) minutes as needed for chest pain.     pantoprazole  (PROTONIX ) 40 MG tablet TAKE ONE TABLET BY MOUTH TWICE DAILY 90 tablet 3   PREMARIN vaginal cream Place vaginally.     primidone (MYSOLINE) 50 MG tablet TAKE ONE TABLET BY MOUTH TWICE DAILY MORNING AND BEDTIME 60 tablet 5   Probiotic Product (GNP ADVANCED PROBIOTIC PO) TAKE ONE CAPSULE BY MOUTH EVERY DAY     Probiotic Product (GNP PROBIOTIC COLON SUPPORT) CAPS Take 1 capsule by mouth daily.     sertraline  (ZOLOFT ) 50 MG tablet Take 1 tablet (50 mg total) by mouth daily. 30 tablet 3   topiramate (TOPAMAX) 25 MG tablet Take 25 mg by mouth 2 (two) times daily.     warfarin (COUMADIN) 6 MG tablet Take 6 mg by mouth at bedtime.     potassium chloride  SA (KLOR-CON  M) 20 MEQ tablet Take 1 tablet (20 mEq total) by mouth 2 (two) times daily. (Patient not taking: Reported on 09/25/2023) 60 tablet 3   simvastatin  (ZOCOR ) 20 MG  tablet Take 20 mg by mouth at bedtime. (Patient not taking: Reported on 09/25/2023)     No facility-administered medications prior to visit.    Allergies  Allergen Reactions   Amoxicillin-Pot Clavulanate Other (See Comments)   Clarithromycin Other (See Comments)    Stomach problems   Erythromycin    Lisinopril Swelling   Amoxicillin Diarrhea    ROS Review of Systems  Constitutional:   Negative for chills and fever.  Eyes:  Negative for visual disturbance.  Respiratory:  Negative for chest tightness and shortness of breath.   Neurological:  Negative for dizziness and headaches.  Psychiatric/Behavioral:  Negative for self-injury.       Objective:    Physical Exam HENT:     Head: Normocephalic.     Mouth/Throat:     Mouth: Mucous membranes are moist.  Cardiovascular:     Rate and Rhythm: Normal rate.     Heart sounds: Normal heart sounds.  Pulmonary:     Effort: Pulmonary effort is normal.     Breath sounds: Normal breath sounds.  Neurological:     Mental Status: She is alert.  Psychiatric:        Mood and Affect: Mood is depressed.        Behavior: Behavior is cooperative.        Thought Content: Thought content is not delusional. Thought content does not include homicidal or suicidal plan.     BP 127/77   Pulse 68   Ht 5\' 3"  (1.6 m)   Wt 134 lb 1.3 oz (60.8 kg)   SpO2 97%   BMI 23.75 kg/m  Wt Readings from Last 3 Encounters:  09/25/23 134 lb 1.3 oz (60.8 kg)  09/21/23 133 lb 4.8 oz (60.5 kg)  09/14/23 140 lb 12.8 oz (63.9 kg)    Lab Results  Component Value Date   TSH 0.694 02/24/2023   Lab Results  Component Value Date   WBC 3.4 (L) 09/21/2023   HGB 12.7 09/21/2023   HCT 35.5 (L) 09/21/2023   MCV 98.9 09/21/2023   PLT 163 09/21/2023   Lab Results  Component Value Date   NA 121 (L) 09/21/2023   K 3.0 (L) 09/21/2023   CO2 21 (L) 09/21/2023   GLUCOSE 111 (H) 09/21/2023   BUN 16 09/21/2023   CREATININE 0.67 09/21/2023   BILITOT 1.0 09/21/2023   ALKPHOS 45 09/21/2023   AST 24 09/21/2023   ALT 20 09/21/2023   PROT 6.5 09/21/2023   ALBUMIN 3.9 09/21/2023   CALCIUM 8.9 09/21/2023   ANIONGAP 12 09/21/2023   EGFR 71 03/21/2023   Lab Results  Component Value Date   CHOL 146 06/27/2022   Lab Results  Component Value Date   HDL 65 06/27/2022   Lab Results  Component Value Date   LDLCALC 62 06/27/2022   Lab Results  Component  Value Date   TRIG 102 06/27/2022   Lab Results  Component Value Date   CHOLHDL 2.2 06/27/2022   Lab Results  Component Value Date   HGBA1C 5.6 02/24/2023      Assessment & Plan:  Depression, recurrent (HCC) Assessment & Plan: PHQ-9 Score: 18 - indicating moderately severe depression. The patient was informed that Medicare does not cover a home health aide under current eligibility criteria. She was encouraged to apply for Medicaid, which may offer additional support services including home care assistance. A referral will be placed today to [facility] for talk therapy and VBCI management with a clinical  social worker to address emotional distress, caregiver burden, and support needs.The patient is encouraged to continue Zoloft  50 mg daily as previously prescribed. She was advised to report any worsening symptoms, particularly the emergence of suicidal thoughts, ideation, or an active plan.Nonpharmacological interventions to support depression management were also reviewed, including engagement in enjoyable or relaxing activities, use of support networks, mindfulness practices, and establishing a structured daily routine. The patient verbalized understanding of the treatment plan and interventions discussed.    Orders: -     Amb ref to [facility] -     AMB Referral VBCI Care Management  Note: This chart has been completed using Engineer, civil (consulting) software, and while attempts have been made to ensure accuracy, certain words and phrases may not be transcribed as intended.    Follow-up: Return in about 3 weeks (around 10/16/2023).   Lundon Verdejo, FNP

## 2023-09-26 ENCOUNTER — Telehealth: Payer: Self-pay | Admitting: *Deleted

## 2023-09-26 ENCOUNTER — Other Ambulatory Visit: Payer: Self-pay | Admitting: *Deleted

## 2023-09-26 MED ORDER — GABAPENTIN 100 MG PO CAPS: 200.0000 mg | ORAL_CAPSULE | Freq: Three times a day (TID) | ORAL | 2 refills | Status: DC

## 2023-09-26 NOTE — Telephone Encounter (Signed)
 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  Per Dr. Katragadda, increase gabapentin  to 200 mg bid.  Patient's medication comes pre packed, therefore I advised that she take her bubble packs to the pharmacy so they can assist her with the medication change.  Verbalized understanding.

## 2023-09-26 NOTE — Progress Notes (Unsigned)
 Complex Care Management Note Care Guide Note  09/26/2023 Name: Courtney Rogers MRN: [id number] DOB: [birth year]   Complex Care Management Outreach Attempts: An unsuccessful telephone outreach was attempted today to offer the patient information about available complex care management services.  Follow Up Plan:  Additional outreach attempts will be made to offer the patient complex care management information and services.   Encounter Outcome:  No Answer  Kandis Ormond, CMA [place]  [facility], [facility] Guide Direct Dial: [phone]  Fax: [phone] Website: [email or web address]

## 2023-09-27 IMAGING — CT CT ABD-PELV W/ CM
2 of 5 series · 16 of 46 positions shown, 18 images · IV contrast (omnipaque)
Comparison: 01/07/2020

CLINICAL DATA: Abdominal and bilateral flank pain for 3 months.

EXAM:
CT ABDOMEN AND PELVIS WITH CONTRAST
TECHNIQUE: Multidetector CT imaging of the abdomen and pelvis was performed
using the standard protocol following bolus administration of
intravenous contrast.
CONTRAST:  100mL OMNIPAQUE IOHEXOL 300 MG/ML  SOLN

[Series 2: axial st · axial · 0.74mm/px · z∈[+1102,+1537]mm · 13 of 99 slices shown, 15 images]
[im 6/99  soft-tissue]
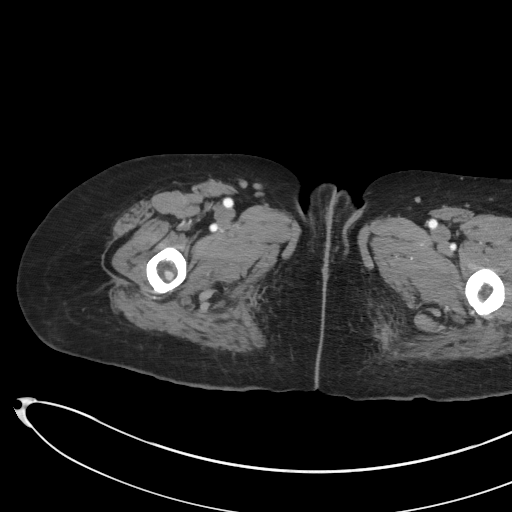
[im 6/99  bone]
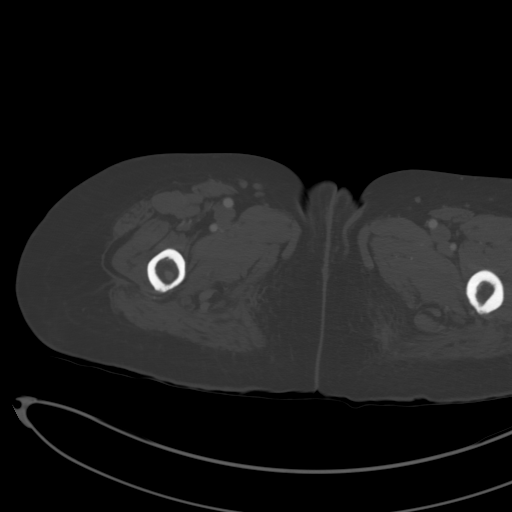
[im 11/99  soft-tissue]
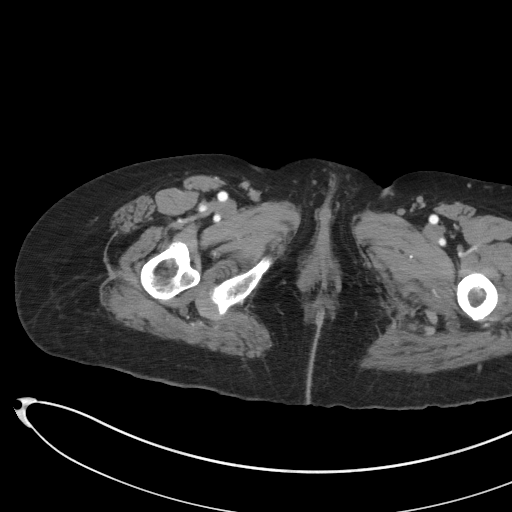
[im 22/99  soft-tissue]
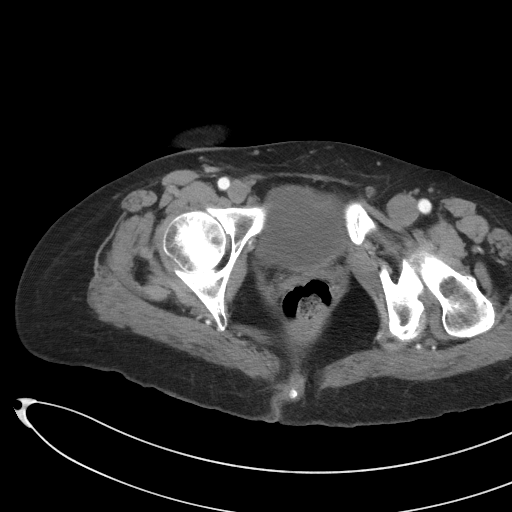
[im 28/99  soft-tissue]
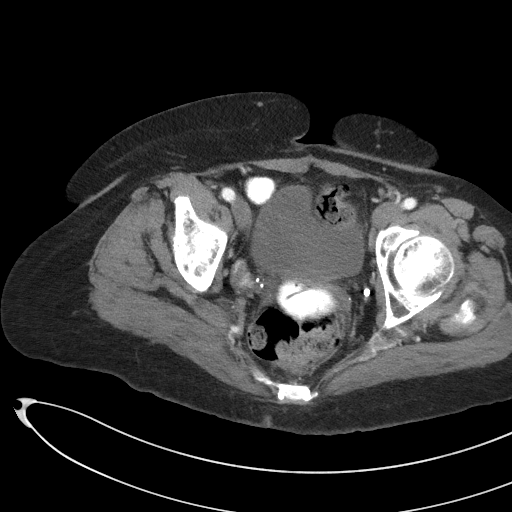
[im 33/99  soft-tissue]
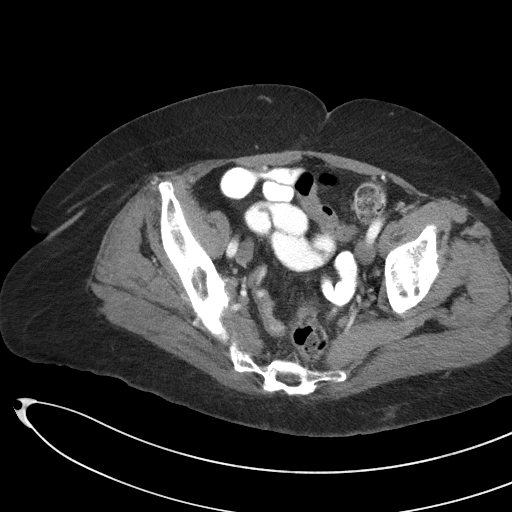
[im 44/99  soft-tissue]
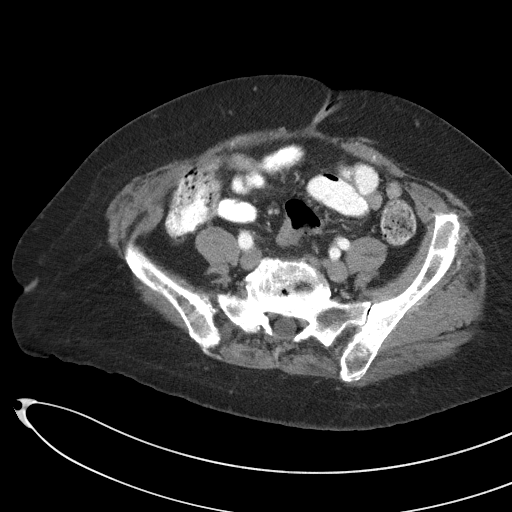
[im 50/99  soft-tissue]
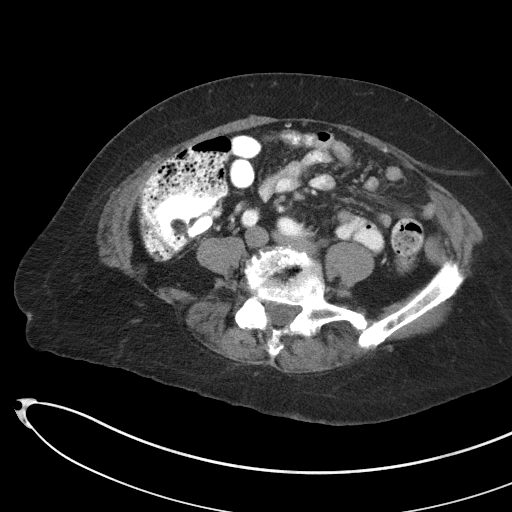
[im 55/99  soft-tissue]
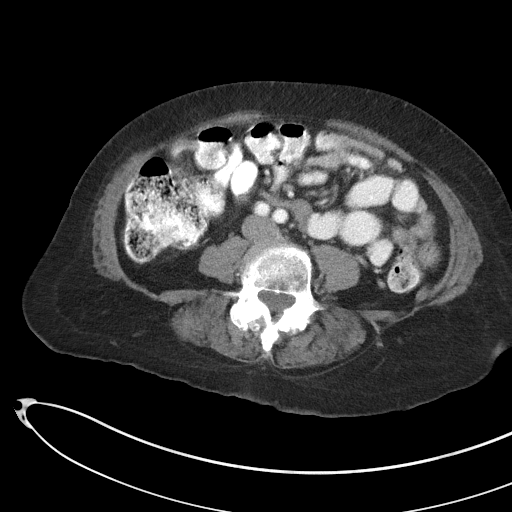
[im 66/99  soft-tissue]
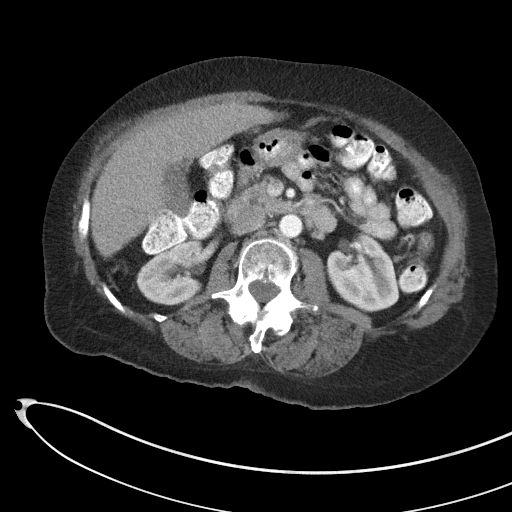
[im 66/99  bone]
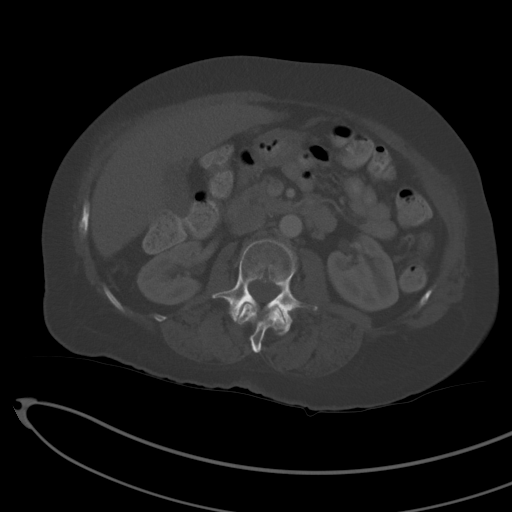
[im 71/99  soft-tissue]
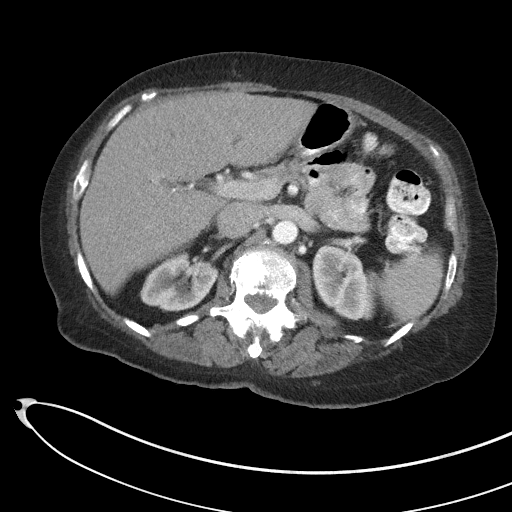
[im 77/99  soft-tissue]
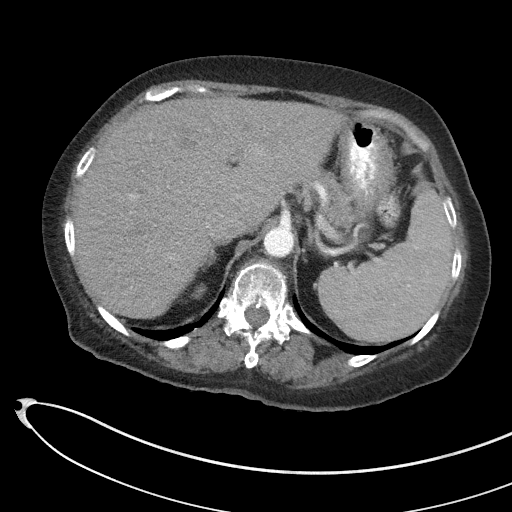
[im 88/99  soft-tissue]
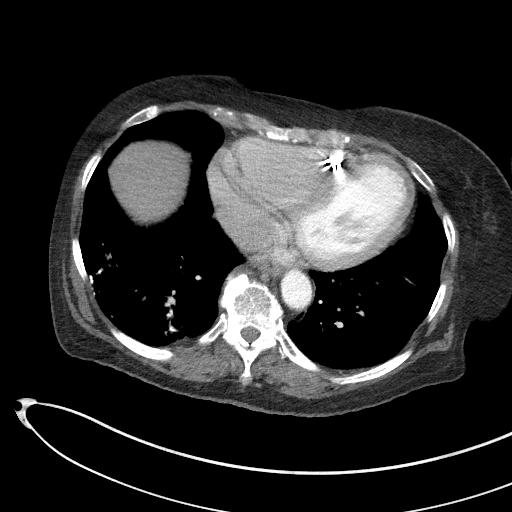
[im 93/99  soft-tissue]
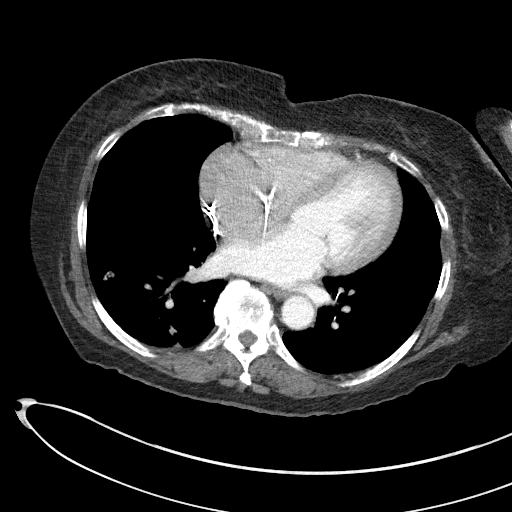

[Series 6: coronal st · coronal · 0.68mm/px · 3 of 117 slices shown]
[im 39/117  soft-tissue]
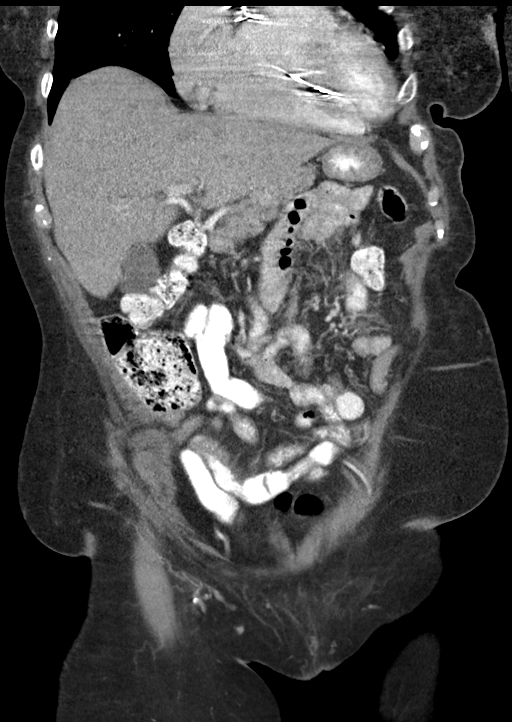
[im 52/117  soft-tissue]
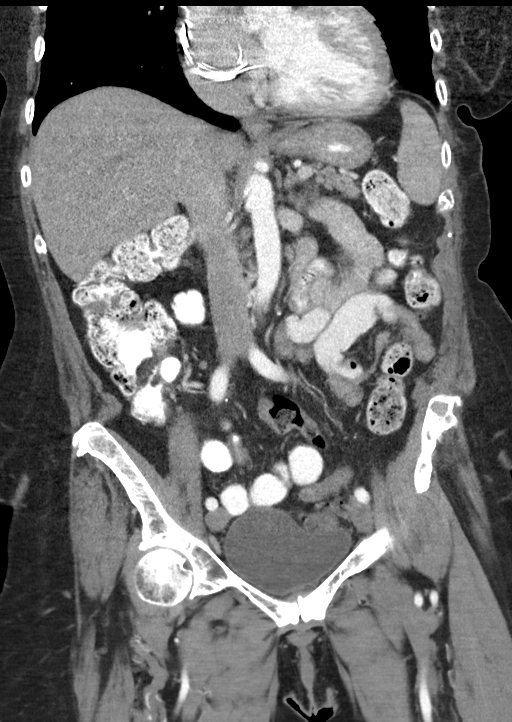
[im 65/117  soft-tissue]
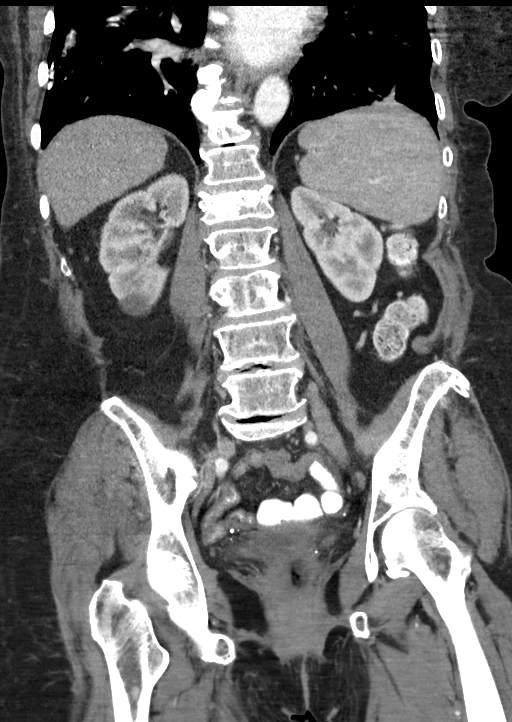

[16 of 46 positions shown; findings below may reference images not displayed]

FINDINGS: Lower Chest: No acute findings. Mild progression of bibasilar
scarring since prior study. Pacemaker leads again seen in the right
atrium and right ventricle.

Hepatobiliary: No hepatic masses identified. Gallbladder is
unremarkable. No evidence of biliary ductal dilatation.

Pancreas:  No mass or inflammatory changes.

Spleen: Within normal limits in size and appearance.

Adrenals/Urinary Tract: No masses identified. A 3.3 cm
benign-appearing cyst is again seen in the lower pole of the right
kidney. A 3 mm calculus is also seen in the lower pole of the right
kidney. No evidence of ureteral calculi or hydronephrosis.
Unremarkable unopacified urinary bladder.

Stomach/Bowel: No evidence of obstruction, inflammatory process or
abnormal fluid collections.

Vascular/Lymphatic: No pathologically enlarged lymph nodes. No acute
vascular findings.

Reproductive: Prior hysterectomy noted. Adnexal regions are
unremarkable in appearance.

Other:  None.

Musculoskeletal: No suspicious bone lesions identified. Previous
vertebroplasty noted at L1.
IMPRESSION: Tiny right renal calculus. No evidence of ureteral calculi,
hydronephrosis, or other acute findings.

## 2023-09-27 NOTE — Progress Notes (Signed)
 Complex Care Management Note  Care Guide Note 09/27/2023 Name: [name] MRN: [id number] DOB: 1951-01-03  Courtney Rogers is a 73 y.o. year old female who sees Zarwolo, [name], FNP for primary care. I reached out to Bevin Bucks by phone today to offer complex care management services.  Courtney Rogers was given information about Complex Care Management services today including:   The Complex Care Management services include support from the care team which includes your Nurse Care Manager, Clinical Social Worker, or Pharmacist.  The Complex Care Management team is here to help remove barriers to the health concerns and goals most important to you. Complex Care Management services are voluntary, and the patient may decline or stop services at any time by request to their care team member.   Complex Care Management Consent Status: Patient agreed to services and verbal consent obtained.   Follow up plan:  Telephone appointment with complex care management team member scheduled for:  09/28/2023  Encounter Outcome:  Patient Scheduled  Kandis Ormond, CMA [place]  [facility], [facility] Guide Direct Dial: [phone]  Fax: [phone] Website: [email or web address]

## 2023-09-27 NOTE — Telephone Encounter (Signed)
 Called office and left message regarding the clearance.

## 2023-09-27 NOTE — Telephone Encounter (Signed)
 Heard anything on clearance?

## 2023-09-28 ENCOUNTER — Other Ambulatory Visit: Payer: Self-pay | Admitting: Licensed Clinical Social Worker

## 2023-09-28 ENCOUNTER — Inpatient Hospital Stay

## 2023-09-28 NOTE — Patient Outreach (Signed)
 Complex Care Management   Visit Note  09/28/2023  Name:  Courtney Rogers MRN: [id number] DOB: 1951/03/13  Situation: Referral received for Complex Care Management related to pca services I obtained verbal consent from Patient.  Visit completed with [facility]  on the phone  Background:   Past Medical History:  Diagnosis Date   Acute myocardial infarction [facility]) 2009   CAD/no stent medically managed   Allergy    Anxiety disorder    CAD (coronary artery disease)    Cancer (HCC)    multiple myeloma   CHF (congestive heart failure) (HCC) 409811   Glaucoma [id number]   Hyperlipidemia    Hypertension    IBS (irritable bowel syndrome)    Non-ulcer dyspepsia 04/30/2009   Qualifier: Diagnosis of  By: Nolene Baumgarten MD, Sandi L    Overactive bladder    PE (pulmonary embolism) 10/2012   left lung   Presence of permanent cardiac pacemaker    Sleep apnea     Assessment: Patient Reported Symptoms:  Cognitive        Neurological      HEENT        Cardiovascular      Respiratory      Endocrine      Gastrointestinal        Genitourinary      Integumentary      Musculoskeletal          Psychosocial              09/25/2023    2:08 PM  Depression screen PHQ 2/9  Decreased Interest 3  Down, Depressed, Hopeless 3  PHQ - 2 Score 6  Altered sleeping 3  Tired, decreased energy 3  Change in appetite 0  Feeling bad or failure about yourself  0  Trouble concentrating 3  Moving slowly or fidgety/restless 3  Suicidal thoughts 0  PHQ-9 Score 18  Difficult doing work/chores Extremely dIfficult    There were no vitals filed for this visit.  Medications Reviewed Today   Medications were not reviewed in this encounter     Recommendation:   Collaborate with pt to provide/secure pca options  Follow Up Plan:   Telephone follow up appointment date/time:  10/03/2023 1pm  Fletcher Humble MSW, LCSW Licensed Clinical Social Worker  [facility],  Population Health Direct Dial: [phone]  Fax: [phone]

## 2023-09-28 NOTE — Patient Instructions (Signed)
 Visit Information  Thank you for taking time to visit with me today. Please don't hesitate to contact me if I can be of assistance to you before our next scheduled appointment.  Your next care management appointment is by telephone on 10/03/2023 at 1pm  Telephone follow up appointment date/time:  10/03/2023 1pm  Please call the care guide team at [phone] if you need to cancel, schedule, or reschedule an appointment.   Please call 911 if you are experiencing a Mental Health or Behavioral Health Crisis or need someone to talk to.  Fletcher Humble MSW, LCSW Licensed Clinical Social Worker  [facility], Population Health Direct Dial: [phone]  Fax: [phone]

## 2023-09-30 ENCOUNTER — Telehealth: Admitting: Emergency Medicine

## 2023-09-30 DIAGNOSIS — R6889 Other general symptoms and signs: Secondary | ICD-10-CM | POA: Diagnosis not present

## 2023-09-30 NOTE — Patient Instructions (Signed)
 Courtney Rogers, thank you for joining Blinda Burger, NP for today's virtual visit.  While this provider is not your primary care provider (PCP), if your PCP is located in our provider database this encounter information will be shared with them immediately following your visit.   A [facility] MyChart account gives you access to today's visit and all your visits, tests, and labs performed at [facility] " click here if you don't have a [name] MyChart account or go to [email or web address]  Consent: (Patient) Courtney Rogers provided verbal consent for this virtual visit at the beginning of the encounter.  Current Medications:  Current Outpatient Medications:    acetaminophen  (TYLENOL ) 500 MG tablet, Take 1,000 mg by mouth 2 (two) times daily as needed for moderate pain or headache., Disp: , Rfl:    acyclovir  (ZOVIRAX ) 400 MG tablet, TAKE ONE TABLET BY MOUTH TWICE DAILY, Disp: 60 tablet, Rfl: 6   B Complex-Folic Acid  (B COMPLEX VITAMINS, W/ FA,) CAPS, Take 1 capsule by mouth daily., Disp: , Rfl:    busPIRone  (BUSPAR ) 5 MG tablet, Take 5 mg by mouth daily., Disp: , Rfl:    Calcium Carb-Cholecalciferol 600-10 MG-MCG TABS, Take 1 tablet by mouth 2 (two) times daily., Disp: , Rfl:    Calcium Carbonate-Vitamin D  (CALCIUM 600+D PO), Take 1 tablet by mouth 2 (two) times daily., Disp: , Rfl:    chlorthalidone (HYGROTON) 25 MG tablet, Take 25 mg by mouth daily., Disp: , Rfl:    cholecalciferol (VITAMIN D3) 10 MCG (400 UNIT) TABS tablet, Take 400 Units by mouth daily., Disp: , Rfl:    clindamycin  (CLEOCIN ) 150 MG capsule, Take 1 capsule (150 mg total) by mouth 3 (three) times daily., Disp: 21 capsule, Rfl: 0   diclofenac  Sodium (VOLTAREN ) 1 % GEL, Apply 4 g topically 4 (four) times daily., Disp: 50 g, Rfl: 0   FEROSUL 325 (65 Fe) MG tablet, Take 325 mg by mouth daily., Disp: , Rfl:    fluticasone  (FLONASE ) 50 MCG/ACT nasal spray, USE 2 SPRAYS IN EACH NOSTRIL ONCE DAILY, Disp:  16 g, Rfl: 0   gabapentin  (NEURONTIN ) 100 MG capsule, Take 2 capsules (200 mg total) by mouth 3 (three) times daily., Disp: 180 capsule, Rfl: 2   GNP VITAMIN C 500 MG tablet, Take 500 mg by mouth daily., Disp: , Rfl:    lenalidomide  (REVLIMID ) 10 MG capsule, Take 1 capsule (10 mg total) by mouth daily. 21 days on, 7 days off every 28 days, Disp: 21 capsule, Rfl: 0   levocetirizine (XYZAL ) 5 MG tablet, TAKE ONE TABLET BY MOUTH IN THE EVENING, Disp: 30 tablet, Rfl: 3   lidocaine  (HM LIDOCAINE  PATCH) 4 %, Place 1 patch onto the skin daily., Disp: 14 patch, Rfl: 0   losartan  (COZAAR ) 100 MG tablet, TAKE ONE TABLET BY MOUTH ONCE DAILY, Disp: 30 tablet, Rfl: 3   magnesium  oxide (MAG-OX) 400 (240 Mg) MG tablet, TAKE ONE TABLET BY MOUTH TWICE DAILY, Disp: 60 tablet, Rfl: 4   metoprolol  tartrate (LOPRESSOR ) 50 MG tablet, Take 1 tablet (50 mg total) by mouth 2 (two) times daily., Disp: 180 tablet, Rfl: 1   montelukast  (SINGULAIR ) 10 MG tablet, TAKE ONE TABLET BY MOUTH AT BEDTIME, Disp: 30 tablet, Rfl: 3   MYRBETRIQ  50 MG TB24 tablet, TAKE ONE TABLET BY MOUTH ONCE DAILY, Disp: 30 tablet, Rfl: 3   nitroGLYCERIN (NITROSTAT) 0.4 MG SL tablet, Place 0.4 mg under the tongue every 5 (five) minutes as needed for chest  pain., Disp: , Rfl:    pantoprazole  (PROTONIX ) 40 MG tablet, TAKE ONE TABLET BY MOUTH TWICE DAILY, Disp: 90 tablet, Rfl: 3   potassium chloride  SA (KLOR-CON  M) 20 MEQ tablet, Take 1 tablet (20 mEq total) by mouth 2 (two) times daily. (Patient not taking: Reported on 09/25/2023), Disp: 60 tablet, Rfl: 3   PREMARIN vaginal cream, Place vaginally., Disp: , Rfl:    primidone (MYSOLINE) 50 MG tablet, TAKE ONE TABLET BY MOUTH TWICE DAILY MORNING AND BEDTIME, Disp: 60 tablet, Rfl: 5   Probiotic Product (GNP ADVANCED PROBIOTIC PO), TAKE ONE CAPSULE BY MOUTH EVERY DAY, Disp: , Rfl:    Probiotic Product (GNP PROBIOTIC COLON SUPPORT) CAPS, Take 1 capsule by mouth daily., Disp: , Rfl:    sertraline  (ZOLOFT ) 50 MG  tablet, Take 1 tablet (50 mg total) by mouth daily., Disp: 30 tablet, Rfl: 3   simvastatin  (ZOCOR ) 20 MG tablet, Take 20 mg by mouth at bedtime. (Patient not taking: Reported on 09/25/2023), Disp: , Rfl:    topiramate (TOPAMAX) 25 MG tablet, Take 25 mg by mouth 2 (two) times daily., Disp: , Rfl:    warfarin (COUMADIN) 6 MG tablet, Take 6 mg by mouth at bedtime., Disp: , Rfl:    Medications ordered in this encounter:  No orders of the defined types were placed in this encounter.    *If you need refills on other medications prior to your next appointment, please contact your pharmacy*  Follow-Up: Call back or seek an in-person evaluation if the symptoms worsen or if the condition fails to improve as anticipated.  [facility] [phone]  Other Instructions Please contact [name]'s office Monday morning so that you can get an appointment to see [name] to talk about your symptoms.  If you feel your symptoms are becoming worse or more severe, please seek care in the emergency room.  If you have been instructed to have an in-person evaluation today at a local Urgent Care facility, please use the link below. It will take you to a list of all of our available [facility], including address, phone number and hours of operation. Please do not delay care.  [facility]  If you or a family member do not have a primary care provider, use the link below to schedule a visit and establish care. When you choose a [facility] primary care physician or advanced practice provider, you gain a long-term partner in health. Find a Primary Care Provider  Learn more about [facility]'s in-office and virtual care options: [facility] Get Care Now

## 2023-09-30 NOTE — Progress Notes (Signed)
 Virtual Visit Consent   Courtney Rogers, you are scheduled for a virtual visit with a [facility] provider today. Just as with appointments in the office, your consent must be obtained to participate. Your consent will be active for this visit and any virtual visit you may have with one of our providers in the next 365 days. If you have a MyChart account, a copy of this consent can be sent to you electronically.  As this is a virtual visit, video technology does not allow for your provider to perform a traditional examination. This may limit your provider's ability to fully assess your condition. If your provider identifies any concerns that need to be evaluated in person or the need to arrange testing (such as labs, EKG, etc.), we will make arrangements to do so. Although advances in technology are sophisticated, we cannot ensure that it will always work on either your end or our end. If the connection with a video visit is poor, the visit may have to be switched to a telephone visit. With either a video or telephone visit, we are not always able to ensure that we have a secure connection.  By engaging in this virtual visit, you consent to the provision of healthcare and authorize for your insurance to be billed (if applicable) for the services provided during this visit. Depending on your insurance coverage, you may receive a charge related to this service.  I need to obtain your verbal consent now. Are you willing to proceed with your visit today? Courtney Rogers has provided verbal consent on 09/30/2023 for a virtual visit (video or telephone). Blinda Burger, NP  Date: 09/30/2023 7:05 PM   Virtual Visit via Video Note   I, Blinda Burger, connected with  Courtney Rogers  ([id number], 1950/12/24) on 09/30/23 at  7:00 PM EDT by a video-enabled telemedicine application and verified that I am speaking with the correct person using two identifiers.  Location: Patient: Virtual Visit Location  Patient: Home Provider: Virtual Visit Location Provider: Home Office   I discussed the limitations of evaluation and management by telemedicine and the availability of in person appointments. The patient expressed understanding and agreed to proceed.    History of Present Illness: Courtney Rogers is a 73 y.o. who identifies as a female who was assigned female at birth, and is being seen today for multiple symptoms.  She reports she feels dehydrated, that she is urinating frequently, also her L shoulder hurts sometimes (comes/and goes), and lastly her feet and hands are numb.  She reports none of the symptoms are new or change or more severe.  She says she cannot put a timeline on any of her symptoms.  Review of records shows she last saw her PCP on 09/25/2023.  That note does not indicate any of the symptoms were discussed.  Patient believes that she was having symptoms at that time, but did not mention them to her PCP.  Does not think she has discussed any of the symptoms with her PCP.    She denies dizziness or lightheadedness  None of her symptoms listed above are different than usual.  Her sister is present with her on video for this visit.  HPI: HPI  Problems:  Patient Active Problem List   Diagnosis Date Noted   Stress at home 09/25/2023   Right sided abdominal pain 08/29/2023   Right flank pain 08/29/2023   Right kidney stone 08/23/2023   Frequent urinary tract infections 08/23/2023  Screening for STD (sexually transmitted disease) 08/09/2023   Dysuria 07/23/2023   Itching 07/23/2023   Urinary tract infection symptoms 07/18/2023   Fungal nail infection 07/18/2023   Toe pain, right 07/18/2023   Cervical radiculopathy 06/27/2023   Sleep disturbance 02/26/2023   Polypharmacy 01/30/2023   Diarrhea 01/30/2023   Hypokalemia 01/12/2023   Numbness and tingling of both feet 11/15/2022   OAB (overactive bladder) 10/24/2022   Vaginal wall prolapse 10/24/2022   Pulmonary embolism  (HCC) 10/24/2022   Postmenopausal state 10/24/2022   Obstructive sleep apnea syndrome 10/24/2022   Midline cystocele 10/24/2022   Social isolation 09/30/2022   Acute left-sided thoracic back pain 02/10/2022   Atypical chest pain 01/13/2022   Fall 09/16/2021   Anxiety and depression 06/18/2021   Generalized weakness 06/04/2021   Abnormal gait 05/06/2021   Amnesia 05/06/2021   Anemia due to chronic blood loss 05/06/2021   Asthma 05/06/2021   Atherosclerotic heart disease of native coronary artery without angina pectoris 05/06/2021   Cardiac pacemaker in situ 05/06/2021   Chronic pain 05/06/2021   Coagulation disorder (HCC) 05/06/2021   Panic disorder 05/06/2021   H/O: hysterectomy 05/06/2021   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   Long term (current) use of anticoagulants 05/06/2021   Depression, recurrent (HCC) 05/06/2021   Mild intermittent asthma 05/06/2021   Mixed hyperlipidemia 05/06/2021   Monoclonal gammopathy 05/06/2021   Obesity 05/06/2021   Personal history of pulmonary embolism 05/06/2021   Recurrent major depression in remission (HCC) 05/06/2021   Severe recurrent major depression without psychotic features (HCC) 05/06/2021   Skin sensation disturbance 05/06/2021   Unspecified mononeuropathy of left upper limb 05/06/2021   Urinary incontinence 05/06/2021   Tremors of nervous system 04/13/2021   Left hip pain 04/13/2021   Allergic rhinitis 03/22/2021   Allergic sinusitis 03/22/2021   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   Generalized abdominal pain 02/02/2021   Buttock pain 10/01/2020   Right knee pain 07/15/2020   Immunization due 07/15/2020   Encounter for subsequent annual wellness visit (AWV) in Medicare patient 07/15/2020   Easy bruising 04/30/2020   Wedge compression fracture of first lumbar vertebra, subsequent encounter for fracture with routine healing 04/06/2020   Trapezius muscle spasm 02/27/2020   Body mass index (BMI) 28.0-28.9, adult 01/14/2020   DDD  (degenerative disc disease), cervical 01/02/2020   Hoarseness of voice 11/27/2019   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   Essential hypertension 12/14/2017   Iron deficiency anemia 08/03/2017   Dyspepsia    Back pain 04/19/2017   Normocytic anemia 01/12/2017   Fatty liver 11/03/2015   Anxiety state 11/03/2015   Weight loss 04/05/2011   GERD 06/01/2010   IRRITABLE BOWEL SYNDROME 07/21/2009    Allergies:  Allergies  Allergen Reactions   Amoxicillin-Pot Clavulanate Other (See Comments)   Clarithromycin Other (See Comments)    Stomach problems   Erythromycin    Lisinopril Swelling   Amoxicillin Diarrhea   Medications:  Current Outpatient Medications:    acetaminophen  (TYLENOL ) 500 MG tablet, Take 1,000 mg by mouth 2 (two) times daily as needed for moderate pain or headache., Disp: , Rfl:    acyclovir  (ZOVIRAX ) 400 MG tablet, TAKE ONE TABLET BY MOUTH TWICE DAILY, Disp: 60 tablet, Rfl: 6   B Complex-Folic Acid  (B COMPLEX VITAMINS, W/ FA,) CAPS, Take 1 capsule by mouth daily., Disp: , Rfl:    busPIRone  (BUSPAR ) 5 MG tablet, Take 5 mg by mouth daily., Disp: , Rfl:    Calcium Carb-Cholecalciferol 600-10 MG-MCG TABS, Take 1 tablet by  mouth 2 (two) times daily., Disp: , Rfl:    Calcium Carbonate-Vitamin D  (CALCIUM 600+D PO), Take 1 tablet by mouth 2 (two) times daily., Disp: , Rfl:    chlorthalidone (HYGROTON) 25 MG tablet, Take 25 mg by mouth daily., Disp: , Rfl:    cholecalciferol (VITAMIN D3) 10 MCG (400 UNIT) TABS tablet, Take 400 Units by mouth daily., Disp: , Rfl:    clindamycin  (CLEOCIN ) 150 MG capsule, Take 1 capsule (150 mg total) by mouth 3 (three) times daily., Disp: 21 capsule, Rfl: 0   diclofenac  Sodium (VOLTAREN ) 1 % GEL, Apply 4 g topically 4 (four) times daily., Disp: 50 g, Rfl: 0   FEROSUL 325 (65 Fe) MG tablet, Take 325 mg by mouth daily., Disp: , Rfl:    fluticasone  (FLONASE ) 50 MCG/ACT nasal spray, USE 2 SPRAYS IN EACH NOSTRIL ONCE DAILY, Disp: 16 g, Rfl: 0   gabapentin   (NEURONTIN ) 100 MG capsule, Take 2 capsules (200 mg total) by mouth 3 (three) times daily., Disp: 180 capsule, Rfl: 2   GNP VITAMIN C 500 MG tablet, Take 500 mg by mouth daily., Disp: , Rfl:    lenalidomide  (REVLIMID ) 10 MG capsule, Take 1 capsule (10 mg total) by mouth daily. 21 days on, 7 days off every 28 days, Disp: 21 capsule, Rfl: 0   levocetirizine (XYZAL ) 5 MG tablet, TAKE ONE TABLET BY MOUTH IN THE EVENING, Disp: 30 tablet, Rfl: 3   lidocaine  (HM LIDOCAINE  PATCH) 4 %, Place 1 patch onto the skin daily., Disp: 14 patch, Rfl: 0   losartan  (COZAAR ) 100 MG tablet, TAKE ONE TABLET BY MOUTH ONCE DAILY, Disp: 30 tablet, Rfl: 3   magnesium  oxide (MAG-OX) 400 (240 Mg) MG tablet, TAKE ONE TABLET BY MOUTH TWICE DAILY, Disp: 60 tablet, Rfl: 4   metoprolol  tartrate (LOPRESSOR ) 50 MG tablet, Take 1 tablet (50 mg total) by mouth 2 (two) times daily., Disp: 180 tablet, Rfl: 1   montelukast  (SINGULAIR ) 10 MG tablet, TAKE ONE TABLET BY MOUTH AT BEDTIME, Disp: 30 tablet, Rfl: 3   MYRBETRIQ  50 MG TB24 tablet, TAKE ONE TABLET BY MOUTH ONCE DAILY, Disp: 30 tablet, Rfl: 3   nitroGLYCERIN (NITROSTAT) 0.4 MG SL tablet, Place 0.4 mg under the tongue every 5 (five) minutes as needed for chest pain., Disp: , Rfl:    pantoprazole  (PROTONIX ) 40 MG tablet, TAKE ONE TABLET BY MOUTH TWICE DAILY, Disp: 90 tablet, Rfl: 3   potassium chloride  SA (KLOR-CON  M) 20 MEQ tablet, Take 1 tablet (20 mEq total) by mouth 2 (two) times daily. (Patient not taking: Reported on 09/25/2023), Disp: 60 tablet, Rfl: 3   PREMARIN vaginal cream, Place vaginally., Disp: , Rfl:    primidone (MYSOLINE) 50 MG tablet, TAKE ONE TABLET BY MOUTH TWICE DAILY MORNING AND BEDTIME, Disp: 60 tablet, Rfl: 5   Probiotic Product (GNP ADVANCED PROBIOTIC PO), TAKE ONE CAPSULE BY MOUTH EVERY DAY, Disp: , Rfl:    Probiotic Product (GNP PROBIOTIC COLON SUPPORT) CAPS, Take 1 capsule by mouth daily., Disp: , Rfl:    sertraline  (ZOLOFT ) 50 MG tablet, Take 1 tablet (50 mg  total) by mouth daily., Disp: 30 tablet, Rfl: 3   simvastatin  (ZOCOR ) 20 MG tablet, Take 20 mg by mouth at bedtime. (Patient not taking: Reported on 09/25/2023), Disp: , Rfl:    topiramate (TOPAMAX) 25 MG tablet, Take 25 mg by mouth 2 (two) times daily., Disp: , Rfl:    warfarin (COUMADIN) 6 MG tablet, Take 6 mg by mouth at bedtime., Disp: ,  Rfl:   Observations/Objective: Patient is well-developed, well-nourished in no acute distress.  Resting comfortably  at home.  Head is normocephalic, atraumatic.  No labored breathing.  Speech is clear and coherent with logical content.  Patient is alert and oriented at baseline.    Assessment and Plan: 1. Multiple complaints (Primary)  I advised her that I am not able to help her sort through her symptoms by video.  I recommended if things become worse/more severe, that she be seen in the ER.  Otherwise follow-up with her PCPs office on Monday (today is Saturday).  I will also route this note to her PCP.  Follow Up Instructions: I discussed the assessment and treatment plan with the patient. The patient was provided an opportunity to ask questions and all were answered. The patient agreed with the plan and demonstrated an understanding of the instructions.  A copy of instructions were sent to the patient via MyChart unless otherwise noted below.   The patient was advised to call back or seek an in-person evaluation if the symptoms worsen or if the condition fails to improve as anticipated.    Blinda Burger, NP

## 2023-10-01 ENCOUNTER — Other Ambulatory Visit: Payer: Self-pay

## 2023-10-01 ENCOUNTER — Emergency Department (HOSPITAL_COMMUNITY)
Admission: EM | Admit: 2023-10-01 | Discharge: 2023-10-01 | Disposition: A | Discharge status: Home / Self Care | Attending: Emergency Medicine | Admitting: Emergency Medicine

## 2023-10-01 ENCOUNTER — Encounter (HOSPITAL_COMMUNITY): Payer: Self-pay

## 2023-10-01 ENCOUNTER — Emergency Department (HOSPITAL_COMMUNITY)

## 2023-10-01 DIAGNOSIS — I2510 Atherosclerotic heart disease of native coronary artery without angina pectoris: Secondary | ICD-10-CM | POA: Diagnosis not present

## 2023-10-01 DIAGNOSIS — Z7901 Long term (current) use of anticoagulants: Secondary | ICD-10-CM | POA: Diagnosis not present

## 2023-10-01 DIAGNOSIS — E871 Hypo-osmolality and hyponatremia: Secondary | ICD-10-CM | POA: Insufficient documentation

## 2023-10-01 DIAGNOSIS — M25512 Pain in left shoulder: Secondary | ICD-10-CM | POA: Insufficient documentation

## 2023-10-01 DIAGNOSIS — E876 Hypokalemia: Secondary | ICD-10-CM | POA: Diagnosis not present

## 2023-10-01 DIAGNOSIS — I509 Heart failure, unspecified: Secondary | ICD-10-CM | POA: Insufficient documentation

## 2023-10-01 LAB — CBC WITH DIFFERENTIAL/PLATELET
Abs Immature Granulocytes: 0.01 10*3/uL (ref 0.00–0.07)
Basophils Absolute: 0.1 10*3/uL (ref 0.0–0.1)
Basophils Relative: 1 %
Eosinophils Absolute: 0.2 10*3/uL (ref 0.0–0.5)
Eosinophils Relative: 4 %
HCT: 35 % — ABNORMAL LOW (ref 36.0–46.0)
Hemoglobin: 11.9 g/dL — ABNORMAL LOW (ref 12.0–15.0)
Immature Granulocytes: 0 %
Lymphocytes Relative: 18 %
Lymphs Abs: 0.8 10*3/uL (ref 0.7–4.0)
MCH: 35.3 pg — ABNORMAL HIGH (ref 26.0–34.0)
MCHC: 34 g/dL (ref 30.0–36.0)
MCV: 103.9 fL — ABNORMAL HIGH (ref 80.0–100.0)
Monocytes Absolute: 0.6 10*3/uL (ref 0.1–1.0)
Monocytes Relative: 14 %
Neutro Abs: 2.7 10*3/uL (ref 1.7–7.7)
Neutrophils Relative %: 63 %
Platelets: 124 10*3/uL — ABNORMAL LOW (ref 150–400)
RBC: 3.37 MIL/uL — ABNORMAL LOW (ref 3.87–5.11)
RDW: 12.6 % (ref 11.5–15.5)
WBC: 4.4 10*3/uL (ref 4.0–10.5)
nRBC: 0 % (ref 0.0–0.2)

## 2023-10-01 LAB — URINALYSIS, ROUTINE W REFLEX MICROSCOPIC
Bilirubin Urine: NEGATIVE
Glucose, UA: NEGATIVE mg/dL
Hgb urine dipstick: NEGATIVE
Ketones, ur: NEGATIVE mg/dL
Leukocytes,Ua: NEGATIVE
Nitrite: NEGATIVE
Protein, ur: NEGATIVE mg/dL
Specific Gravity, Urine: 1.009 (ref 1.005–1.030)
pH: 8 (ref 5.0–8.0)

## 2023-10-01 LAB — BASIC METABOLIC PANEL WITH GFR
Anion gap: 9 (ref 5–15)
BUN: 17 mg/dL (ref 8–23)
CO2: 27 mmol/L (ref 22–32)
Calcium: 8.7 mg/dL — ABNORMAL LOW (ref 8.9–10.3)
Chloride: 91 mmol/L — ABNORMAL LOW (ref 98–111)
Creatinine, Ser: 0.71 mg/dL (ref 0.44–1.00)
GFR, Estimated: >60 mL/min (ref >60)
Glucose, Bld: 94 mg/dL (ref 70–99)
Potassium: 3.4 mmol/L — ABNORMAL LOW (ref 3.5–5.1)
Sodium: 127 mmol/L — ABNORMAL LOW (ref 135–145)

## 2023-10-01 LAB — TROPONIN I (HIGH SENSITIVITY)
Troponin I (High Sensitivity): 10 ng/L (ref <18)
Troponin I (High Sensitivity): 10 ng/L (ref <18)

## 2023-10-01 LAB — PROTIME-INR
INR: 1.8 — ABNORMAL HIGH (ref 0.8–1.2)
Prothrombin Time: 21.4 s — ABNORMAL HIGH (ref 11.4–15.2)

## 2023-10-01 MED ORDER — LIDOCAINE 5 % EX PTCH: 1.0000 | MEDICATED_PATCH | CUTANEOUS | 0 refills | Status: AC

## 2023-10-01 MED ORDER — KETOROLAC TROMETHAMINE 30 MG/ML IJ SOLN: 15.0000 mg | Freq: Once | INTRAMUSCULAR | Status: DC

## 2023-10-01 MED ORDER — LIDOCAINE 5 % EX PTCH
1.0000 | MEDICATED_PATCH | CUTANEOUS | Status: DC
  Administered 2023-10-01: 1 via TRANSDERMAL
  Filled 2023-10-01: qty 1

## 2023-10-01 NOTE — Discharge Instructions (Signed)
 You may try the lidoderm  patch for your shoulder and neck pain.  Your sodium level remains low but it is better than when it was last checked by Dr. Cheree Cords.  Continue increasing salt in your diet to help prevent it from getting lower.  Plan follow up care as scheduled.

## 2023-10-01 NOTE — ED Provider Notes (Signed)
 [place] EMERGENCY DEPARTMENT AT [facility] Provider Note   CSN: [id number] Arrival date & time: 10/01/23  1211     History  Chief Complaint  Patient presents with   Shoulder and Neck Pain    Courtney Rogers is a 73 y.o. female with a history including CAD, hyperlipidemia, IBS, history of MI, CHF and history of PE, multiple myeloma, here for evaluation of left shoulder and neck pain which radiates into her left  upper back and worsened with movement, particularly moving the left arm or rotating her neck particularly leftward rotation.  She states she has had some problems with chronic pain in the site for several months but has been worse since she woke today.  She denies any injuries.  She also denies chest pain, shortness of breath, she denies weakness or numbness in her arms or hands.  She denies fevers or chills.  The history is provided by the patient.       Home Medications Prior to Admission medications   Medication Sig Start Date End Date Taking? Authorizing Provider  lidocaine  (LIDODERM ) 5 % Place 1 patch onto the skin daily. Remove & Discard patch within 12 hours or as directed by MD 10/01/23  Yes Emeli Goguen, Concha Deed, PA-C  acetaminophen  (TYLENOL ) 500 MG tablet Take 1,000 mg by mouth 2 (two) times daily as needed for moderate pain or headache.    [provider]  acyclovir  (ZOVIRAX ) 400 MG tablet TAKE ONE TABLET BY MOUTH TWICE DAILY 07/04/23   Paulett Boros, MD  B Complex-Folic Acid  (B COMPLEX VITAMINS, W/ FA,) CAPS Take 1 capsule by mouth daily. 09/09/21   [provider]  busPIRone  (BUSPAR ) 5 MG tablet Take 5 mg by mouth daily.    Craige Dixon, MD  Calcium Carb-Cholecalciferol 600-10 MG-MCG TABS Take 1 tablet by mouth 2 (two) times daily. 08/31/23   [provider]  Calcium Carbonate-Vitamin D  (CALCIUM 600+D PO) Take 1 tablet by mouth 2 (two) times daily.    [provider]  chlorthalidone (HYGROTON) 25 MG tablet Take 25 mg by  mouth daily. 06/04/22   [provider]  cholecalciferol (VITAMIN D3) 10 MCG (400 UNIT) TABS tablet Take 400 Units by mouth daily. 09/30/22   [provider]  clindamycin  (CLEOCIN ) 150 MG capsule Take 1 capsule (150 mg total) by mouth 3 (three) times daily. 08/03/23   Towanda Fret, MD  diclofenac  Sodium (VOLTAREN ) 1 % GEL Apply 4 g topically 4 (four) times daily. 11/19/21   Zarwolo, [name], FNP  FEROSUL 325 (65 Fe) MG tablet Take 325 mg by mouth daily. 07/28/23   [provider]  fluticasone  (FLONASE ) 50 MCG/ACT nasal spray USE 2 SPRAYS IN EACH NOSTRIL ONCE DAILY 06/21/23   Zarwolo, [name], FNP  gabapentin  (NEURONTIN ) 100 MG capsule Take 2 capsules (200 mg total) by mouth 3 (three) times daily. 09/26/23   Paulett Boros, MD  GNP VITAMIN C 500 MG tablet Take 500 mg by mouth daily. 08/31/23   [provider]  lenalidomide  (REVLIMID ) 10 MG capsule Take 1 capsule (10 mg total) by mouth daily. 21 days on, 7 days off every 28 days 09/12/23   Katragadda, Sreedhar, MD  levocetirizine (XYZAL ) 5 MG tablet TAKE ONE TABLET BY MOUTH IN THE EVENING 08/18/23   Zarwolo, [name], FNP  losartan  (COZAAR ) 100 MG tablet TAKE ONE TABLET BY MOUTH ONCE DAILY 08/18/23   Zarwolo, [name], FNP  magnesium  oxide (MAG-OX) 400 (240 Mg) MG tablet TAKE ONE TABLET BY MOUTH TWICE  DAILY 08/18/23   Katragadda, Sreedhar, MD  metoprolol  tartrate (LOPRESSOR ) 50 MG tablet Take 1 tablet (50 mg total) by mouth 2 (two) times daily. 10/07/21   Paseda, Folashade R, FNP  montelukast  (SINGULAIR ) 10 MG tablet TAKE ONE TABLET BY MOUTH AT BEDTIME 09/20/23   Zarwolo, [name], FNP  MYRBETRIQ  50 MG TB24 tablet TAKE ONE TABLET BY MOUTH ONCE DAILY 09/20/23   Zarwolo, [name], FNP  nitroGLYCERIN (NITROSTAT) 0.4 MG SL tablet Place 0.4 mg under the tongue every 5 (five) minutes as needed for chest pain.    [provider]  pantoprazole  (PROTONIX ) 40 MG tablet TAKE ONE TABLET BY MOUTH TWICE DAILY 05/03/23   Zarwolo,  [name], FNP  potassium chloride  SA (KLOR-CON  M) 20 MEQ tablet Take 1 tablet (20 mEq total) by mouth 2 (two) times daily. Patient not taking: Reported on 09/25/2023 09/04/23   Paulett Boros, MD  PREMARIN vaginal cream Place vaginally. 09/19/23   [provider]  primidone (MYSOLINE) 50 MG tablet TAKE ONE TABLET BY MOUTH TWICE DAILY MORNING AND BEDTIME 06/05/23   Zarwolo, [name], FNP  Probiotic Product (GNP ADVANCED PROBIOTIC PO) TAKE ONE CAPSULE BY MOUTH EVERY DAY 08/31/23   [provider]  Probiotic Product (GNP PROBIOTIC COLON SUPPORT) CAPS Take 1 capsule by mouth daily. 07/08/21   [provider]  sertraline  (ZOLOFT ) 50 MG tablet Take 1 tablet (50 mg total) by mouth daily. 09/19/23   Zarwolo, [name], FNP  simvastatin  (ZOCOR ) 20 MG tablet Take 20 mg by mouth at bedtime. Patient not taking: Reported on 09/25/2023 09/20/23   [provider]  topiramate (TOPAMAX) 25 MG tablet Take 25 mg by mouth 2 (two) times daily. 01/06/23   [provider]  warfarin (COUMADIN) 6 MG tablet Take 6 mg by mouth at bedtime. 08/04/23   [provider]      Allergies    Amoxicillin-pot clavulanate, Clarithromycin, Erythromycin, Lisinopril, and Amoxicillin    Review of Systems   Review of Systems  Constitutional:  Negative for chills and fever.  HENT:  Negative for congestion.   Eyes: Negative.   Respiratory:  Negative for chest tightness and shortness of breath.   Cardiovascular:  Negative for chest pain.  Gastrointestinal:  Negative for abdominal pain, nausea and vomiting.  Genitourinary: Negative.   Musculoskeletal:  Positive for arthralgias, back pain and neck pain. Negative for joint swelling and neck stiffness.  Skin: Negative.  Negative for rash and wound.  Neurological:  Negative for dizziness, weakness, light-headedness, numbness and headaches.  Psychiatric/Behavioral: Negative.      Physical Exam Updated Vital Signs BP (!) 152/70   Pulse 87    Temp 98.1 F (36.7 C) (Oral)   Resp 18   Ht 5\' 3"  (1.6 m)   Wt 60.8 kg   SpO2 99%   BMI 23.74 kg/m  Physical Exam Vitals and nursing note reviewed.  Constitutional:      Appearance: She is well-developed.  HENT:     Head: Normocephalic and atraumatic.  Eyes:     Conjunctiva/sclera: Conjunctivae normal.  Cardiovascular:     Rate and Rhythm: Normal rate and regular rhythm.     Heart sounds: Normal heart sounds.  Pulmonary:     Effort: Pulmonary effort is normal.     Breath sounds: Normal breath sounds. No wheezing.  Abdominal:     General: Bowel sounds are normal.     Palpations: Abdomen is soft.     Tenderness: There is no abdominal tenderness.  Musculoskeletal:  General: Normal range of motion.     Left shoulder: Bony tenderness present. No deformity or effusion.     Cervical back: Normal range of motion.     Comments: TTP left lateral humeral head,  no joint edema,  no pain with passive ROM,  ttp active flex.  Ttp along course of left trapezius from c spine to superior left scapular ridge.  No spasm appreciated. No rash. No bruising or edema.   Skin:    General: Skin is warm and dry.  Neurological:     Mental Status: She is alert.     ED Results / Procedures / Treatments   Labs (all labs ordered are listed, but only abnormal results are displayed) Labs Reviewed  CBC WITH DIFFERENTIAL/PLATELET - Abnormal; Notable for the following components:      Result Value   RBC 3.37 (*)    Hemoglobin 11.9 (*)    HCT 35.0 (*)    MCV 103.9 (*)    MCH 35.3 (*)    Platelets 124 (*)    All other components within normal limits  BASIC METABOLIC PANEL WITH GFR - Abnormal; Notable for the following components:   Sodium 127 (*)    Potassium 3.4 (*)    Chloride 91 (*)    Calcium 8.7 (*)    All other components within normal limits  PROTIME-INR - Abnormal; Notable for the following components:   Prothrombin Time 21.4 (*)    INR 1.8 (*)    All other components within normal  limits  URINALYSIS, ROUTINE W REFLEX MICROSCOPIC  TROPONIN I (HIGH SENSITIVITY)  TROPONIN I (HIGH SENSITIVITY)    EKG None  Radiology DG Shoulder Left Result Date: 10/01/2023 CLINICAL DATA:  Several month history of left shoulder pain EXAM: LEFT SHOULDER - 3 VIEW COMPARISON:  None Available. FINDINGS: Partially imaged left chest wall pacemaker. Partially imaged leads are intact. There is no evidence of fracture or dislocation the left shoulder. Bony callus formation involving left lateral 6 and seventh ribs. IMPRESSION: 1. No acute fracture or dislocation of the left shoulder. 2. Bony callus formation involving left lateral 6 and seventh ribs, likely healing rib fractures. Electronically Signed   By: Limin  Xu M.D.   On: 10/01/2023 14:08   DG Chest Portable 1 View Result Date: 10/01/2023 CLINICAL DATA:  Pain in left shoulder. EXAM: PORTABLE CHEST 1 VIEW COMPARISON:  04/12/2023 FINDINGS: Left chest wall pacer device noted with leads in the right atrial appendage and right ventricle. Heart size and mediastinal contours are stable. No pleural fluid or interstitial edema. Chronic scarring, calcifications architectural distortion within the right lower lung is unchanged compared with the previous exam. No superimposed airspace disease. The visualized osseous structures appear intact. IMPRESSION: 1. No acute findings. 2. Chronic scarring, calcifications and architectural distortion within the right lower lung. Unchanged from previous imaging. Electronically Signed   By: Kimberley Penman M.D.   On: 10/01/2023 14:02    Procedures Procedures    Medications Ordered in ED Medications  lidocaine  (LIDODERM ) 5 % 1 patch (1 patch Transdermal Patch Applied 10/01/23 1614)    ED Course/ Medical Decision Making/ A&P                                 Medical Decision Making Pt presenting with acute pain left shoulder through left lateral neck, worse with movement, suggesting MSK source.  Denies cp, denies sob.   H/o multiple  myeloma under care of Dr. Cheree Cords.  Labs and imaging obtained to rule out MM complication/bony lesion/stress fx, also cardiac labs to rule out ACS equivalent.  Work up reassuring,  most likely msk source.  Pt is on coumadin, limiting tx options.  Given lidoderm  patch and script,  also discussed home tx including heat tx, aat.  Plan close f/u with pcp.   Amount and/or Complexity of Data Reviewed Labs: ordered.    Details: Negative delta troponins. UA, bmet, cbc stable.  Chronic hyponatremia but improved from last check.  Advised continued increased salt in diet.   Radiology: ordered.    Details: Left shoulder reviewed - no bony lesions or fx.   Risk Prescription drug management.           Final Clinical Impression(s) / ED Diagnoses Final diagnoses:  Pain in joint of left shoulder    Rx / DC Orders ED Discharge Orders          Ordered    lidocaine  (LIDODERM ) 5 %  Every 24 hours        10/01/23 1654              Katherine Pancake, PA-C 10/01/23 1838    Merdis Stalling, MD 10/01/23 1858

## 2023-10-01 NOTE — ED Triage Notes (Signed)
 Pt BIB [facility] due to left side neck and shoulder pain that has been going on for several months. No fall reported. Pt asking for urinary catheter because " she pees a lot". Pt A&Ox4. Pain is a 4

## 2023-10-02 NOTE — Telephone Encounter (Signed)
 Thank you for update.    If she was admitted overnight, can we get a copy of hospital discharge summary. I wonder if she went to [place]?

## 2023-10-02 NOTE — Telephone Encounter (Signed)
 Pt was seen in cardiology office on 09/12/23 to get clearance. She was having issues with slurred speech, INR was low and some other issues. She went back to office on 09/25/23 for hospital follow up. So now she will be going back to see doctor on 10/09/23 to get clearance for procedure.

## 2023-10-03 ENCOUNTER — Ambulatory Visit: Admitting: Urology

## 2023-10-03 ENCOUNTER — Other Ambulatory Visit: Payer: Self-pay | Admitting: Licensed Clinical Social Worker

## 2023-10-03 NOTE — Patient Outreach (Signed)
 Complex Care Management   Visit Note  10/03/2023  Name:  Courtney Rogers MRN: [id number] DOB: 1950/08/22  Situation: Referral received for Complex Care Management related to personal care asst  I obtained verbal consent from Patient.  Visit completed with Brenda Calkin  on the phone  Background:   Past Medical History:  Diagnosis Date   Acute myocardial infarction [facility]) 2009   CAD/no stent medically managed   Allergy    Anxiety disorder    CAD (coronary artery disease)    Cancer (HCC)    multiple myeloma   CHF (congestive heart failure) (HCC) 756433   Glaucoma 295188   Hyperlipidemia    Hypertension    IBS (irritable bowel syndrome)    Non-ulcer dyspepsia 04/30/2009   Qualifier: Diagnosis of  By: Nolene Baumgarten MD, Sandi L    Overactive bladder    PE (pulmonary embolism) 10/2012   left lung   Presence of permanent cardiac pacemaker    Sleep apnea     Assessment: Patient Reported Symptoms:  Cognitive        Neurological      HEENT        Cardiovascular      Respiratory      Endocrine      Gastrointestinal        Genitourinary      Integumentary      Musculoskeletal          Psychosocial       Do you feel physically threatened by others?: No      09/25/2023    2:08 PM  Depression screen PHQ 2/9  Decreased Interest 3  Down, Depressed, Hopeless 3  PHQ - 2 Score 6  Altered sleeping 3  Tired, decreased energy 3  Change in appetite 0  Feeling bad or failure about yourself  0  Trouble concentrating 3  Moving slowly or fidgety/restless 3  Suicidal thoughts 0  PHQ-9 Score 18  Difficult doing work/chores Extremely dIfficult    There were no vitals filed for this visit.  Medications Reviewed Today     Reviewed by Fletcher Humble, LCSW (Social Worker) on 10/03/23 at 1803  Med List Status: <None>   Medication Order Taking? Sig Documenting Provider Last Dose Status Informant  acetaminophen  (TYLENOL ) 500 MG tablet [id number] No Take 1,000 mg by mouth 2  (two) times daily as needed for moderate pain or headache. [provider] Taking Active Self  acyclovir  (ZOVIRAX ) 400 MG tablet [id number] No TAKE ONE TABLET BY MOUTH TWICE DAILY Paulett Boros, MD Taking Active   B Complex-Folic Acid  (B COMPLEX VITAMINS, W/ FA,) CAPS [id number] No Take 1 capsule by mouth daily. [provider] Taking Active   busPIRone  (BUSPAR ) 5 MG tablet [id number] No Take 5 mg by mouth daily. Craige Dixon, MD Taking Active            Med Note Adra Alanis   Tue Jan 31, 2023  6:56 PM) In pill pack  Calcium Carb-Cholecalciferol 600-10 MG-MCG TABS [id number] No Take 1 tablet by mouth 2 (two) times daily. [provider] Taking Active   Calcium Carbonate-Vitamin D  (CALCIUM 600+D PO) [id number] No Take 1 tablet by mouth 2 (two) times daily. [provider] Taking Active Self           Med Note Adra Alanis   Tue Jan 31, 2023  6:57 PM) In pill pack  chlorthalidone (HYGROTON) 25 MG tablet [id number] No Take 25 mg by mouth daily. [provider] Taking Active            Med Note Adra Alanis   Tue Jan 31, 2023  6:58 PM) In pill pack  cholecalciferol (VITAMIN D3) 10 MCG (400 UNIT) TABS tablet [id number] No Take 400 Units by mouth daily. [provider] Taking Active            Med Note Adra Alanis   Tue Jan 31, 2023  6:58 PM) In pill pack  clindamycin  (CLEOCIN ) 150 MG capsule [id number] No Take 1 capsule (150 mg total) by mouth 3 (three) times daily. Towanda Fret, MD Taking Active   diclofenac  Sodium (VOLTAREN ) 1 % GEL [id number] No Apply 4 g topically 4 (four) times daily. Zarwolo, [name], FNP Taking Active            Med Note Adra Alanis   Tue Jan 31, 2023  6:40 PM) PRN  FEROSUL 325 (65 Fe) MG tablet [id number] No Take 325 mg by mouth daily. [provider] Taking Active   fluticasone  (FLONASE ) 50 MCG/ACT nasal spray [id number] No USE 2 SPRAYS IN EACH NOSTRIL ONCE DAILY Zarwolo, [name], FNP Taking Active    gabapentin  (NEURONTIN ) 100 MG capsule [id number]  Take 2 capsules (200 mg total) by mouth 3 (three) times daily. Katragadda, Sreedhar, MD  Active   [facility] VITAMIN C 500 MG tablet [id number] No Take 500 mg by mouth daily. [provider] Taking Active   lenalidomide  (REVLIMID ) 10 MG capsule [id number] No Take 1 capsule (10 mg total) by mouth daily. 21 days on, 7 days off every 28 days Paulett Boros, MD Taking Active   levocetirizine (XYZAL ) 5 MG tablet [id number] No TAKE ONE TABLET BY MOUTH IN THE EVENING Zarwolo, [name], FNP Taking Active   lidocaine  (LIDODERM ) 5 % [id number]  Place 1 patch onto the skin daily. Remove & Discard patch within 12 hours or as directed by MD Katherine Pancake, PA-C  Active   losartan  (COZAAR ) 100 MG tablet [id number] No TAKE ONE TABLET BY MOUTH ONCE DAILY Zarwolo, [name], FNP Taking Active   magnesium  oxide (MAG-OX) 400 (240 Mg) MG tablet [id number] No TAKE ONE TABLET BY MOUTH TWICE DAILY Paulett Boros, MD Taking Active   metoprolol  tartrate (LOPRESSOR ) 50 MG tablet [id number] No Take 1 tablet (50 mg total) by mouth 2 (two) times daily. Paseda, Folashade R, FNP Taking Active            Med Note Adra Alanis   Tue Jan 31, 2023  7:06 PM) In pill pack  montelukast  (SINGULAIR ) 10 MG tablet [id number] No TAKE ONE TABLET BY MOUTH AT BEDTIME Zarwolo, [name], FNP Taking Active   MYRBETRIQ  50 MG TB24 tablet [id number] No TAKE ONE TABLET BY MOUTH ONCE DAILY Zarwolo, [name], FNP Taking Active   nitroGLYCERIN (NITROSTAT) 0.4 MG SL tablet [id number] No Place 0.4 mg under the tongue every 5 (five) minutes as needed for chest pain. [provider] Taking Active Self  pantoprazole  (PROTONIX ) 40 MG tablet [id number] No TAKE ONE TABLET BY MOUTH TWICE DAILY Zarwolo, [name], FNP Taking Active   potassium chloride  SA (KLOR-CON  M) 20 MEQ tablet [id number] No Take 1 tablet (20 mEq total) by mouth 2 (two) times daily.  Patient not taking: Reported on 09/25/2023   Paulett Boros,  MD Not Taking Active   PREMARIN vaginal cream [id number] No Place vaginally. [provider] Taking Active   primidone (MYSOLINE) 50 MG tablet [id number] No TAKE ONE TABLET BY MOUTH TWICE DAILY MORNING AND BEDTIME  Zarwolo, [name], FNP Taking Active   Probiotic Product [facility] ADVANCED PROBIOTIC PO) [id number] No TAKE ONE CAPSULE BY MOUTH EVERY DAY [provider] Taking Active   Probiotic Product (GNP PROBIOTIC COLON SUPPORT) CAPS [id number] No Take 1 capsule by mouth daily. [provider] Taking Active Self  sertraline  (ZOLOFT ) 50 MG tablet [id number] No Take 1 tablet (50 mg total) by mouth daily. Zarwolo, [name], FNP Taking Active   simvastatin  (ZOCOR ) 20 MG tablet [id number] No Take 20 mg by mouth at bedtime.  Patient not taking: Reported on 09/25/2023   [provider] Not Taking Active   topiramate (TOPAMAX) 25 MG tablet [id number] No Take 25 mg by mouth 2 (two) times daily. [provider] Taking Active            Med Note Adra Alanis   Tue Jan 31, 2023  7:10 PM) In pill pack  warfarin (COUMADIN) 6 MG tablet [id number] No Take 6 mg by mouth at bedtime. [provider] Taking Active             Recommendation:   Contact pca agencies to inquiry about services.   Follow Up Plan:   Telephone follow up appointment date/time:  10/18/2023   Fletcher Humble MSW, LCSW Licensed Clinical Social Worker  [facility], Population Health Direct Dial: [phone]  Fax: [phone]

## 2023-10-03 NOTE — Patient Instructions (Addendum)
 Visit Information  Thank you for taking time to visit with me today. Please don't hesitate to contact me if I can be of assistance to you before our next scheduled appointment.  Your next care management appointment is by telephone on 10/18/2023 at 230pm  Telephone follow up appointment date/time:  10/18/2023   Please call the care guide team at [phone] if you need to cancel, schedule, or reschedule an appointment.   Please call 911 if you are experiencing a Mental Health or Behavioral Health Crisis or need someone to talk to.  Fletcher Humble MSW, LCSW Licensed Clinical Social Worker  [facility], Population Health Direct Dial: [phone]  Fax: [phone]

## 2023-10-04 ENCOUNTER — Other Ambulatory Visit: Payer: Self-pay

## 2023-10-04 DIAGNOSIS — C9000 Multiple myeloma not having achieved remission: Secondary | ICD-10-CM

## 2023-10-05 ENCOUNTER — Telehealth: Payer: Self-pay | Admitting: Family Medicine

## 2023-10-05 ENCOUNTER — Ambulatory Visit: Payer: Self-pay

## 2023-10-05 ENCOUNTER — Inpatient Hospital Stay

## 2023-10-05 ENCOUNTER — Inpatient Hospital Stay: Admitting: Dietician

## 2023-10-05 VITALS — BP 125/62 | HR 69 | Temp 97.7°F | Resp 18 | Wt 132.8 lb

## 2023-10-05 DIAGNOSIS — C9000 Multiple myeloma not having achieved remission: Secondary | ICD-10-CM

## 2023-10-05 DIAGNOSIS — D509 Iron deficiency anemia, unspecified: Secondary | ICD-10-CM

## 2023-10-05 DIAGNOSIS — Z5112 Encounter for antineoplastic immunotherapy: Secondary | ICD-10-CM | POA: Diagnosis not present

## 2023-10-05 LAB — COMPREHENSIVE METABOLIC PANEL WITH GFR
ALT: 33 U/L (ref 0–44)
AST: 29 U/L (ref 15–41)
Albumin: 3.7 g/dL (ref 3.5–5.0)
Alkaline Phosphatase: 41 U/L (ref 38–126)
Anion gap: 10 (ref 5–15)
BUN: 18 mg/dL (ref 8–23)
CO2: 24 mmol/L (ref 22–32)
Calcium: 8.4 mg/dL — ABNORMAL LOW (ref 8.9–10.3)
Chloride: 93 mmol/L — ABNORMAL LOW (ref 98–111)
Creatinine, Ser: 0.77 mg/dL (ref 0.44–1.00)
GFR, Estimated: >60 mL/min (ref >60)
Glucose, Bld: 109 mg/dL — ABNORMAL HIGH (ref 70–99)
Potassium: 3.3 mmol/L — ABNORMAL LOW (ref 3.5–5.1)
Sodium: 127 mmol/L — ABNORMAL LOW (ref 135–145)
Total Bilirubin: 0.8 mg/dL (ref 0.0–1.2)
Total Protein: 6.9 g/dL (ref 6.5–8.1)

## 2023-10-05 LAB — CBC WITH DIFFERENTIAL/PLATELET
Abs Immature Granulocytes: 0.02 10*3/uL (ref 0.00–0.07)
Basophils Absolute: 0 10*3/uL (ref 0.0–0.1)
Basophils Relative: 1 %
Eosinophils Absolute: 0.2 10*3/uL (ref 0.0–0.5)
Eosinophils Relative: 5 %
HCT: 33.5 % — ABNORMAL LOW (ref 36.0–46.0)
Hemoglobin: 11.9 g/dL — ABNORMAL LOW (ref 12.0–15.0)
Immature Granulocytes: 1 %
Lymphocytes Relative: 19 %
Lymphs Abs: 0.8 10*3/uL (ref 0.7–4.0)
MCH: 36.4 pg — ABNORMAL HIGH (ref 26.0–34.0)
MCHC: 35.5 g/dL (ref 30.0–36.0)
MCV: 102.4 fL — ABNORMAL HIGH (ref 80.0–100.0)
Monocytes Absolute: 0.7 10*3/uL (ref 0.1–1.0)
Monocytes Relative: 16 %
Neutro Abs: 2.4 10*3/uL (ref 1.7–7.7)
Neutrophils Relative %: 58 %
Platelets: 140 10*3/uL — ABNORMAL LOW (ref 150–400)
RBC: 3.27 MIL/uL — ABNORMAL LOW (ref 3.87–5.11)
RDW: 12.8 % (ref 11.5–15.5)
WBC: 4.1 10*3/uL (ref 4.0–10.5)
nRBC: 0 % (ref 0.0–0.2)

## 2023-10-05 LAB — MAGNESIUM: Magnesium: 2.3 mg/dL (ref 1.7–2.4)

## 2023-10-05 MED ORDER — BORTEZOMIB CHEMO SQ INJECTION 3.5 MG (2.5MG/ML)
0.7000 mg/m2 | Freq: Once | INTRAMUSCULAR | Status: AC
  Administered 2023-10-05: 1.25 mg via SUBCUTANEOUS
  Filled 2023-10-05: qty 0.5

## 2023-10-05 MED ORDER — PROCHLORPERAZINE MALEATE 10 MG PO TABS
10.0000 mg | ORAL_TABLET | Freq: Once | ORAL | Status: AC
  Administered 2023-10-05: 10 mg via ORAL
  Filled 2023-10-05: qty 1

## 2023-10-05 MED ORDER — DENOSUMAB 120 MG/1.7ML ~~LOC~~ SOLN
120.0000 mg | Freq: Once | SUBCUTANEOUS | Status: AC
  Administered 2023-10-05: 120 mg via SUBCUTANEOUS
  Filled 2023-10-05: qty 1.7

## 2023-10-05 NOTE — Telephone Encounter (Signed)
 No message attached

## 2023-10-05 NOTE — Telephone Encounter (Signed)
 Copied from CRM [id number]. Topic: Clinical - Medication Question >> Oct 05, 2023  1:00 PM [name] wrote: Reason for CRM: patient is wanting to get on a new rx because  sertraline  (ZOLOFT ) 50 MG tablet [ is not working for her please call [phone]   Attempted to call tp but number left to call not in service.

## 2023-10-05 NOTE — Progress Notes (Signed)
 Nutrition Assessment   Reason for Assessment: Referral (wt loss)   ASSESSMENT: 73 year old female with myeloma. She is receiving maintenance Velcade  and Revlimid . Patient is under the care of Dr. Cheree Cords   Past medical history includes HTN, pulmonary embolism, asthma, OSA, IBS, GERD, fatty liver, DDD, frequent UTI, HLD  Pt completed treatment and left infusion early. Unable to see patient. Discussed with RN. Patient eating well. She complains of increasing fatigue. RD attempted to call patient, however no answer. Unable to leave message. Chart reviewed.   Nutrition Focused Physical Exam: deferred    Medications: acyclovir , Bcomplex, Ca 600 + D, clindamycin , ferrous sulfate , gabapentin , xyzal , cozaar , mag-ox, lopressor , klor-con , primidone, topamax, warafin, zoloft    Labs: Na 127, K 3.3, glucose 109   Anthropometrics:   Height: 5'3" Weight: 134 lb 0.6 oz UBW: 140-145 lb  BMI: 23.74    NUTRITION DIAGNOSIS: Unintended wt loss related to cancer as evidenced by 4% wt loss in 3 weeks   INTERVENTION:  Recommend small frequent meals/snacks high in calories and protein  Encourage adding salt to foods   MONITORING, EVALUATION, GOAL: Patient will tolerate adequate calories and protein to minimize wt loss during treatment    Next Visit: Thursday May 29 during infusion

## 2023-10-05 NOTE — Telephone Encounter (Signed)
 Copied from CRM [id number]. Topic: Clinical - Medication Question >> Oct 05, 2023  1:00 PM [name] wrote: Reason for CRM: patient is wanting to get on a new rx because  sertraline  (ZOLOFT ) 50 MG tablet [ is not working for her please call [phone]     Second attempt to reach pt "not in service."

## 2023-10-05 NOTE — Telephone Encounter (Signed)
  Chief Complaint: depression Symptoms: daily stressors that causes pt to think about thngs when she is trying to go to sleep says "my mind is wandering" Pertinent Negatives: Patient denies SI,HI, self care deficits or change in appetite Disposition: [] ED /[] Urgent Care (no appt availability in office) / [x] Appointment(In office/virtual)/ []  [facility]/ [] Home Care/ [] Refused Recommended Disposition /[] [facility]/ []  Follow-up with PCP Additional Notes: pt has a hospital Follow up tomorrow- pt mentioned she felt Celexa helped her and wondered why she was taken off of it Reason for Disposition  [1] New or changed psychiatric medications > 2 weeks ago AND [2] not feeling any better  Answer Assessment - Initial Assessment Questions 1. CONCERN: "What happened that made you call today?"     After dose increased has made no change 2. DEPRESSION SYMPTOM SCREENING: "How are you feeling overall?" (e.g., decreased energy, increased sleeping or difficulty sleeping, difficulty concentrating, feelings of sadness, guilt, hopelessness, or worthlessness)     Mind wandering 3. RISK OF HARM - SUICIDAL IDEATION:  "Do you ever have thoughts of hurting or killing yourself?"  (e.g., yes, no, no but preoccupation with thoughts about death)   - INTENT:  "Do you have thoughts of hurting or killing yourself right NOW?" (e.g., yes, no, N/A)   - PLAN: "Do you have a specific plan for how you would do this?" (e.g., gun, knife, overdose, no plan, N/A)     no 4. RISK OF HARM - HOMICIDAL IDEATION:  "Do you ever have thoughts of hurting or killing someone else?"  (e.g., yes, no, no but preoccupation with thoughts about death)   - INTENT:  "Do you have thoughts of hurting or killing someone right NOW?" (e.g., yes, no, N/A)   - PLAN: "Do you have a specific plan for how you would do this?" (e.g., gun, knife, no plan, N/A)      no 5. FUNCTIONAL IMPAIRMENT: "How have things been going for you overall? Have  you had more difficulty than usual doing your normal daily activities?"  (e.g., better, same, worse; self-care, school, work, interactions)     no 6. SUPPORT: "Who is with you now?" "Who do you live with?" "Do you have family or friends who you can talk to?"      Husband  7. THERAPIST: "Do you have a counselor or therapist? Name?"     no 8. STRESSORS: "Has there been any new stress or recent changes in your life?"     Every day stuff piles up" 9. ALCOHOL USE OR SUBSTANCE USE (DRUG USE): "Do you drink alcohol or use any illegal drugs?"     no 10. OTHER: "Do you have any other physical symptoms right now?" (e.g., fever)       no 11. PREGNANCY: "Is there any chance you are pregnant?" "When was your last menstrual period?"       *No Answer*  Protocols used: Depression-A-AH

## 2023-10-05 NOTE — Progress Notes (Signed)
 Patient tolerated Velcade   and xgeva  injection with no complaints voiced.  Lab work reviewed.  See MAR for details.  Injection site clean and dry with no bruising or swelling noted.  Patient stable during and after injection.  Band aid applied.  VSS.  Patient left in satisfactory condition with no s/s of distress noted. All follow ups as scheduled. Transportation called, pt escorted via wheelchair to be driven home by transportation services.   Fayrene Towner [employer]

## 2023-10-06 ENCOUNTER — Ambulatory Visit (INDEPENDENT_AMBULATORY_CARE_PROVIDER_SITE_OTHER): Payer: Self-pay

## 2023-10-06 VITALS — BP 104/68 | HR 66 | Ht 63.0 in | Wt 132.1 lb

## 2023-10-06 DIAGNOSIS — F339 Major depressive disorder, recurrent, unspecified: Secondary | ICD-10-CM

## 2023-10-06 DIAGNOSIS — M5412 Radiculopathy, cervical region: Secondary | ICD-10-CM | POA: Diagnosis not present

## 2023-10-06 DIAGNOSIS — R1084 Generalized abdominal pain: Secondary | ICD-10-CM | POA: Diagnosis not present

## 2023-10-06 DIAGNOSIS — K589 Irritable bowel syndrome without diarrhea: Secondary | ICD-10-CM | POA: Diagnosis not present

## 2023-10-06 LAB — KAPPA/LAMBDA LIGHT CHAINS
Kappa free light chain: 22.2 mg/L — ABNORMAL HIGH (ref 3.3–19.4)
Kappa, lambda light chain ratio: 0.82 (ref 0.26–1.65)
Lambda free light chains: 27.1 mg/L — ABNORMAL HIGH (ref 5.7–26.3)

## 2023-10-06 MED ORDER — TRAMADOL HCL 50 MG PO TABS: 50.0000 mg | ORAL_TABLET | Freq: Four times a day (QID) | ORAL | 0 refills | Status: DC | PRN

## 2023-10-06 MED ORDER — DICYCLOMINE HCL 10 MG PO CAPS: 10.0000 mg | ORAL_CAPSULE | Freq: Three times a day (TID) | ORAL | 2 refills | Status: DC

## 2023-10-06 MED ORDER — CITALOPRAM HYDROBROMIDE 20 MG PO TABS
20.0000 mg | ORAL_TABLET | Freq: Every day | ORAL | 3 refills | Status: DC
Start: 2023-10-06 — End: 2023-10-19

## 2023-10-06 NOTE — Progress Notes (Signed)
   Established Patient Office Visit  Subjective   Patient ID: Courtney Rogers, female    DOB: [birth year]  Age: 73 y.o. MRN: [id number]  Chief Complaint  Patient presents with   Hospitalization Follow-up    Hosptial- fu also pt states burning in chest started in the waiting room, complaints of not feeling well.    HPI    Review of Systems  Constitutional:  Positive for malaise/fatigue.  HENT: Negative.    Eyes: Negative.   Respiratory: Negative.    Cardiovascular: Negative.   Gastrointestinal:  Positive for abdominal pain.  Genitourinary: Negative.   Musculoskeletal: Negative.   Skin: Negative.   Neurological: Negative.   Psychiatric/Behavioral:  Positive for depression.       Objective:     BP 104/68   Pulse 66   Ht 5\' 3"  (1.6 m)   Wt 132 lb 1.3 oz (59.9 kg)   SpO2 96%   BMI 23.40 kg/m    Physical Exam Vitals and nursing note reviewed.  Constitutional:      Appearance: Normal appearance.  Abdominal:     General: Abdomen is protuberant. Bowel sounds are decreased.     Tenderness: There is generalized abdominal tenderness. There is no right CVA tenderness or left CVA tenderness.     Hernia: No hernia is present.  Musculoskeletal:     Right knee: Swelling and bony tenderness present. Decreased range of motion.     Left knee: No swelling or bony tenderness. Decreased range of motion.  Neurological:     Mental Status: She is alert.     Gait: Gait abnormal.      No results found for any visits on 10/06/23.    The ASCVD Risk score (Arnett DK, et al., 2019) failed to calculate for the following reasons:   Risk score cannot be calculated because patient has a medical history suggesting prior/existing ASCVD    Assessment & Plan:   Problem List Items Addressed This Visit       Digestive   Irritable bowel syndrome   No acute abdomen on exam. Issue is chronic in nature.  Will add dicyclomine  for symptom relief.        Relevant Medications    dicyclomine  (BENTYL ) 10 MG capsule     Nervous and Auditory   Cervical radiculopathy   Chronic in nature and pain not well controlled with current medications.  Will add tramadol  for prn pain relief.       Relevant Medications   citalopram  (CELEXA ) 20 MG tablet   traMADol  (ULTRAM ) 50 MG tablet     Other   Generalized abdominal pain   No acute abdomen on exam.  Will add dicyclomine  for ongoing abdominal pain.  Recommend GI consult if symptoms are not improving.       Depression, recurrent (HCC) - Primary   She does not feel that her symptoms are well controlled with Zoloft .  Add celexa  in its place.  Advised pt to watch for worsening of depression and/or anxiety.  Recommend f/u in 2-3 months or sooner if needed.        Relevant Medications   citalopram  (CELEXA ) 20 MG tablet    No follow-ups on file.    Alison Irvine, FNP

## 2023-10-09 ENCOUNTER — Ambulatory Visit: Admitting: Family Medicine

## 2023-10-09 ENCOUNTER — Other Ambulatory Visit: Payer: Self-pay | Admitting: *Deleted

## 2023-10-09 ENCOUNTER — Telehealth: Payer: Self-pay

## 2023-10-09 LAB — PROTEIN ELECTROPHORESIS, SERUM
A/G Ratio: 1.3 (ref 0.7–1.7)
Albumin ELP: 3.5 g/dL (ref 2.9–4.4)
Alpha-1-Globulin: 0.3 g/dL (ref 0.0–0.4)
Alpha-2-Globulin: 0.7 g/dL (ref 0.4–1.0)
Beta Globulin: 0.9 g/dL (ref 0.7–1.3)
Gamma Globulin: 0.8 g/dL (ref 0.4–1.8)
Globulin, Total: 2.7 g/dL (ref 2.2–3.9)
M-Spike, %: 0.3 g/dL — ABNORMAL HIGH
Total Protein ELP: 6.2 g/dL (ref 6.0–8.5)

## 2023-10-09 MED ORDER — LENALIDOMIDE 10 MG PO CAPS: ORAL_CAPSULE | ORAL | 0 refills | Status: DC

## 2023-10-09 NOTE — Telephone Encounter (Signed)
 Copied from CRM [id number]. Topic: Clinical - Prescription Issue >> Oct 09, 2023  4:08 PM Everette C wrote: Reason for CRM: The patient would like to speak with a member of clinical staff to discuss their prescription for lenalidomide  (REVLIMID ) 10 MG capsule [[id number]] and confirm that it has been submitted to their pharmacy for refill   Please contact the patient further if/when possible

## 2023-10-10 NOTE — Assessment & Plan Note (Signed)
 She does not feel that her symptoms are well controlled with Zoloft .  Add celexa  in its place.  Advised pt to watch for worsening of depression and/or anxiety.  Recommend f/u in 2-3 months or sooner if needed.

## 2023-10-10 NOTE — Assessment & Plan Note (Signed)
 No acute abdomen on exam.  Will add dicyclomine  for ongoing abdominal pain.  Recommend GI consult if symptoms are not improving.

## 2023-10-10 NOTE — Telephone Encounter (Signed)
 See TE on 09/07/23.

## 2023-10-10 NOTE — Telephone Encounter (Signed)
 Contacted patient and gave her Dr Milburn [facility] number- told her it looks like it may have been filled yesterday but to call and be sure.

## 2023-10-10 NOTE — Telephone Encounter (Signed)
 Cardiac clearance received to hold coumadin.  Hold coumadin four days before tcs/egd Hold iron 7 days Am of procedure: cardiology wants her to take her potassium, magnesium , metoprolol , losartan , chlorthalidone, nebulizer.   Can you please ask her if she has had a recent stroke? I was never able to get copy of hospital d/c summery.

## 2023-10-10 NOTE — Telephone Encounter (Signed)
 We do not fill that medication. She needs to contact Dr Katragadda for refill.

## 2023-10-10 NOTE — Assessment & Plan Note (Signed)
 No acute abdomen on exam. Issue is chronic in nature.  Will add dicyclomine  for symptom relief.

## 2023-10-10 NOTE — Assessment & Plan Note (Signed)
 Chronic in nature and pain not well controlled with current medications.  Will add tramadol  for prn pain relief.

## 2023-10-10 NOTE — Telephone Encounter (Signed)
 Records from cardiology office are scanned under media tab

## 2023-10-10 NOTE — Telephone Encounter (Signed)
 LMTRC

## 2023-10-11 ENCOUNTER — Ambulatory Visit: Admitting: Urology

## 2023-10-11 ENCOUNTER — Encounter: Payer: Self-pay | Admitting: *Deleted

## 2023-10-11 ENCOUNTER — Other Ambulatory Visit: Payer: Self-pay | Admitting: *Deleted

## 2023-10-11 MED ORDER — NA SULFATE-K SULFATE-MG SULF 17.5-3.13-1.6 GM/177ML PO SOLN: ORAL | 0 refills | Status: DC

## 2023-10-11 NOTE — Telephone Encounter (Signed)
 Pt has been scheduled for 11/03/23. Instructions mailed and prep sent to the pharmacy. She stated that she did not have a stroke when she was admitted to the hospital. Pt would like a small volume prep instead of the "big jug".

## 2023-10-11 NOTE — Telephone Encounter (Signed)
 Ok to give small volume prep

## 2023-10-11 NOTE — Telephone Encounter (Signed)
 noted

## 2023-10-11 NOTE — Telephone Encounter (Signed)
 Pt returned call  [facility]

## 2023-10-12 ENCOUNTER — Ambulatory Visit (INDEPENDENT_AMBULATORY_CARE_PROVIDER_SITE_OTHER): Admitting: Urology

## 2023-10-12 ENCOUNTER — Inpatient Hospital Stay

## 2023-10-12 ENCOUNTER — Inpatient Hospital Stay: Admitting: Hematology

## 2023-10-12 ENCOUNTER — Encounter: Payer: Self-pay | Admitting: Urology

## 2023-10-12 VITALS — BP 109/66 | HR 67

## 2023-10-12 DIAGNOSIS — N3946 Mixed incontinence: Secondary | ICD-10-CM

## 2023-10-12 DIAGNOSIS — N952 Postmenopausal atrophic vaginitis: Secondary | ICD-10-CM

## 2023-10-12 DIAGNOSIS — N8111 Cystocele, midline: Secondary | ICD-10-CM

## 2023-10-12 DIAGNOSIS — N3281 Overactive bladder: Secondary | ICD-10-CM | POA: Diagnosis not present

## 2023-10-12 DIAGNOSIS — R413 Other amnesia: Secondary | ICD-10-CM

## 2023-10-12 DIAGNOSIS — N281 Cyst of kidney, acquired: Secondary | ICD-10-CM

## 2023-10-12 DIAGNOSIS — F411 Generalized anxiety disorder: Secondary | ICD-10-CM | POA: Insufficient documentation

## 2023-10-12 DIAGNOSIS — Z90710 Acquired absence of both cervix and uterus: Secondary | ICD-10-CM

## 2023-10-12 DIAGNOSIS — F329 Major depressive disorder, single episode, unspecified: Secondary | ICD-10-CM | POA: Insufficient documentation

## 2023-10-12 DIAGNOSIS — N200 Calculus of kidney: Secondary | ICD-10-CM

## 2023-10-12 DIAGNOSIS — R350 Frequency of micturition: Secondary | ICD-10-CM

## 2023-10-12 DIAGNOSIS — N8110 Cystocele, unspecified: Secondary | ICD-10-CM

## 2023-10-12 LAB — BLADDER SCAN AMB NON-IMAGING: Scan Result: >324

## 2023-10-12 NOTE — Patient Instructions (Addendum)
 Plan: 1. First priority is managing constipation because a full rectum can cause pressure anteriorly and result in difficulty emptying your bladder, like you experienced today while in the Urology office. Incomplete bladder emptying increases your risk for urinary tract infection (UTI). For constipation management please take over-the-counter laxatives and/or stool softeners daily or as needed to make sure you are having routine bowel movements every 1-2 days. Follow up with your Gastroenterologist if constipation continues to be a problem. 2. OK to continue Myrbetriq  (Mirabegron ) 50 mg daily for now; may need to discontinue that if you continue to be unable to empty your bladder adequately even once your constipation is resolved with routine bowel movements every 1-2 days. 3. Restart topical vaginal estrogen cream (estradiol, "Premarin") every night at bedtime to address vaginal dryness / atrophy, which can also increase UTI risk and exacerbated urinary urgency/frequency. Referral placed to GYN to re-establish care there.  4. No concerns regarding small right kidney stone and right renal cyst (appears benign). Will plan to follow up on that with ultrasound in 6 months.

## 2023-10-12 NOTE — Progress Notes (Signed)
 Name: Courtney Rogers DOB: 06/26/1950 MRN: [id number]  History of Present Illness: [name] is a 73 y.o. female who presents alone today as a new patient at [facility] [place]. All available relevant medical records have been reviewed. She is a questionable historian. GU / GYN History includes: 1. OAB with urge incontinence. 2. Stress urinary incontinence. 3. Kidney stones. - One 5 mm right kidney stone found incidentally on CT 08/12/2023. - She denies prior history of kidney stones or prior stone procedure(s). 4. Right renal cyst. 5. Cystocele. 6. Vaginal atrophy.  Today: She reports that within the past 1-2 days she has been having urinary hesitancy and sensations of incomplete emptying. Reports chronic constipation; states her last bowel movement was 3-4 days ago. Reports taking stool softeners/laxatives sometimes; last used Milk of Magnesia 3-4 days ago.   At baseline she reports urinary urgency and frequency; has taken Myrbetriq  for >2 years with improvement. Reports rare urge incontinence "only if I wait too long." Also reports occasional stress urinary incontinence. Denies significant bother related to urinary incontinence.   Today she denies dysuria, gross hematuria, straining to void, flank pain, abdominal pain, fevers, nausea, or vomiting.  She reports stinging/burning sensation on her labia. Previously tried topical vaginal estrogen cream but states that stung so she stopped using that. She reports vaginal bleeding or abnormal discharge. Denies vaginal bulge sensation.   Medications: Current Outpatient Medications  Medication Sig Dispense Refill   MYRBETRIQ  50 MG TB24 tablet TAKE ONE TABLET BY MOUTH ONCE DAILY 30 tablet 3   acetaminophen  (TYLENOL ) 500 MG tablet Take 1,000 mg by mouth 2 (two) times daily as needed for moderate pain or headache.     acyclovir  (ZOVIRAX ) 400 MG tablet TAKE ONE TABLET BY MOUTH TWICE DAILY 60 tablet 6   B Complex-Folic Acid  (B  COMPLEX VITAMINS, W/ FA,) CAPS Take 1 capsule by mouth daily.     busPIRone  (BUSPAR ) 5 MG tablet Take 5 mg by mouth daily.     Calcium Carb-Cholecalciferol 600-10 MG-MCG TABS Take 1 tablet by mouth 2 (two) times daily.     Calcium Carbonate-Vitamin D  (CALCIUM 600+D PO) Take 1 tablet by mouth 2 (two) times daily.     chlorthalidone (HYGROTON) 25 MG tablet Take 25 mg by mouth daily.     cholecalciferol (VITAMIN D3) 10 MCG (400 UNIT) TABS tablet Take 400 Units by mouth daily.     citalopram  (CELEXA ) 20 MG tablet Take 1 tablet (20 mg total) by mouth daily. 30 tablet 3   diclofenac  Sodium (VOLTAREN ) 1 % GEL Apply 4 g topically 4 (four) times daily. 50 g 0   dicyclomine  (BENTYL ) 10 MG capsule Take 1 capsule (10 mg total) by mouth 3 (three) times daily before meals. 90 capsule 2   FEROSUL 325 (65 Fe) MG tablet Take 325 mg by mouth daily.     fluticasone  (FLONASE ) 50 MCG/ACT nasal spray USE 2 SPRAYS IN EACH NOSTRIL ONCE DAILY 16 g 0   gabapentin  (NEURONTIN ) 100 MG capsule Take 2 capsules (200 mg total) by mouth 3 (three) times daily. 180 capsule 2   GNP VITAMIN C 500 MG tablet Take 500 mg by mouth daily.     lenalidomide  (REVLIMID ) 10 MG capsule Take 1 capsule (10 mg total) by mouth daily. 21 days on, 7 days off every 28 days 21 capsule 0   levocetirizine (XYZAL ) 5 MG tablet TAKE ONE TABLET BY MOUTH IN THE EVENING 30 tablet 3   lidocaine  (LIDODERM ) 5 %  Place 1 patch onto the skin daily. Remove & Discard patch within 12 hours or as directed by MD 30 patch 0   losartan  (COZAAR ) 100 MG tablet TAKE ONE TABLET BY MOUTH ONCE DAILY 30 tablet 3   magnesium  oxide (MAG-OX) 400 (240 Mg) MG tablet TAKE ONE TABLET BY MOUTH TWICE DAILY 60 tablet 4   metoprolol  tartrate (LOPRESSOR ) 50 MG tablet Take 1 tablet (50 mg total) by mouth 2 (two) times daily. 180 tablet 1   montelukast  (SINGULAIR ) 10 MG tablet TAKE ONE TABLET BY MOUTH AT BEDTIME 30 tablet 3   Na Sulfate-K Sulfate-Mg Sulfate concentrate (SUPREP) 17.5-3.13-1.6  GM/177ML SOLN As directed 354 mL 0   nitroGLYCERIN (NITROSTAT) 0.4 MG SL tablet Place 0.4 mg under the tongue every 5 (five) minutes as needed for chest pain.     pantoprazole  (PROTONIX ) 40 MG tablet TAKE ONE TABLET BY MOUTH TWICE DAILY 90 tablet 3   potassium chloride  SA (KLOR-CON  M) 20 MEQ tablet Take 1 tablet (20 mEq total) by mouth 2 (two) times daily. 60 tablet 3   PREMARIN vaginal cream Place vaginally. (Patient not taking: Reported on 10/12/2023)     primidone (MYSOLINE) 50 MG tablet TAKE ONE TABLET BY MOUTH TWICE DAILY MORNING AND BEDTIME 60 tablet 5   Probiotic Product (GNP ADVANCED PROBIOTIC PO) TAKE ONE CAPSULE BY MOUTH EVERY DAY     Probiotic Product (GNP PROBIOTIC COLON SUPPORT) CAPS Take 1 capsule by mouth daily.     simvastatin  (ZOCOR ) 20 MG tablet Take 20 mg by mouth at bedtime.     topiramate (TOPAMAX) 25 MG tablet Take 25 mg by mouth 2 (two) times daily.     warfarin (COUMADIN) 6 MG tablet Take 6 mg by mouth at bedtime.     No current facility-administered medications for this visit.    Allergies: Allergies  Allergen Reactions   Amoxicillin-Pot Clavulanate Other (See Comments)   Clarithromycin Other (See Comments)    Stomach problems   Erythromycin    Lisinopril Swelling   Amoxicillin Diarrhea    Past Medical History:  Diagnosis Date   Acute myocardial infarction [facility]) 2009   CAD/no stent medically managed   Allergy    Anxiety disorder    CAD (coronary artery disease)    Cancer (HCC)    multiple myeloma   CHF (congestive heart failure) (HCC) 161096   Glaucoma 045409   Hyperlipidemia    Hypertension    IBS (irritable bowel syndrome)    Non-ulcer dyspepsia 04/30/2009   Qualifier: Diagnosis of  By: Nolene Baumgarten MD, Sandi L    Overactive bladder    PE (pulmonary embolism) 10/2012   left lung   Presence of permanent cardiac pacemaker    Sleep apnea    Past Surgical History:  Procedure Laterality Date   ABDOMINAL HYSTERECTOMY     BSO secondary to cyst      CARDIAC CATHETERIZATION     COLONOSCOPY  12/2009   Dr. Myrlene Asper, propofol, normal. Next TCS 12/2019   COLONOSCOPY N/A 05/22/2017   Examined portion ileum was normal, significant looping of colon, external hemorrhoids and rectal bleeding due to internal hemorrhoids, mild diverticulosis procedure: COLONOSCOPY;  Surgeon: Alyce Jubilee, MD;  [facility];  Service: Endoscopy;  Laterality: N/A;  10:30am   ESOPHAGOGASTRODUODENOSCOPY  05/11/2009   schatzki ring/small hiatal hernia/path:gastritis   ESOPHAGOGASTRODUODENOSCOPY N/A 05/22/2017   Multiple gastric polyps with biopsy benign fundic gland polyp, small bowel biopsy negative for celiac, gastric biopsy with mild gastritis but no  H. pylori procedure: ESOPHAGOGASTRODUODENOSCOPY (EGD);  Surgeon: Alyce Jubilee, MD;  Location: [facility];  Service: Endoscopy;  Laterality: N/A;   ESOPHAGOGASTRODUODENOSCOPY (EGD) WITH ESOPHAGEAL DILATION N/A 04/01/2013   [id number] at the gastroesophageal juction/multiple small polyps/mild gastritis   GIVENS CAPSULE STUDY N/A 06/07/2017   Frequent gastric erosions, normal small bowel mucosa.  Procedure: GIVENS CAPSULE STUDY;  Surgeon: Alyce Jubilee, MD;  Location: [facility];  Service: Endoscopy;  Laterality: N/A;  7:30am   INSERT / REPLACE / REMOVE PACEMAKER     Last year per pt.(can't remember date)   JOINT REPLACEMENT  [phone]   REPLACEMENT TOTAL KNEE Right 08/2020   Family History  Problem Relation Age of Onset   Anxiety disorder Mother    Cancer Mother    Heart disease Father    Hypertension Father    Colon polyps Neg Hx    Colon cancer Neg Hx    Social History   Socioeconomic History   Marital status: Married    Spouse name: Doctor, general practice   Number of children: 3   Years of education: 16   Highest education level: Master's degree (e.g., MA, MS, MEng, MEd, MSW, MBA)  Occupational History   Not on file  Tobacco Use   Smoking status: Never    Passive exposure: Never   Smokeless  tobacco: Never   Tobacco comments:    Never smoked   Vaping Use   Vaping status: Never Used  Substance and Sexual Activity   Alcohol use: Not Currently    Comment: occ wine   Drug use: No   Sexual activity: Not Currently  Other Topics Concern   Not on file  Social History Narrative      Lives with husband-51 years 950 in Aug 2021   Daughter is close by, 2 sons live further away   One IN [place],ONE IN [place], ONE BESIDE HER.        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   Enjoys: reading, young adult      Diet: eats all food groups    Caffeine: coffee 1, tea daily soda-1 daily   Water : 1-2 cups      Wears seat belt    Does not use phone while driving   Smoke detectors at home    Licensed conveyancer -locked up      Left handed   One story home   Drinks caffeine   Social Drivers of Health   Financial Resource Strain: Low Risk  (06/26/2023)   Overall Financial Resource Strain (CARDIA)    Difficulty of Paying Living Expenses: Not hard at all  Food Insecurity: No Food Insecurity (10/03/2023)   Hunger Vital Sign    Worried About Running Out of Food in the Last Year: Never true    Ran Out of Food in the Last Year: Never true  Transportation Needs: No Transportation Needs (10/03/2023)   PRAPARE - Administrator, Civil Service (Medical): No    Lack of Transportation (Non-Medical): No  Physical Activity: Insufficiently Active (06/26/2023)   Exercise Vital Sign    Days of Exercise per Week: 2 days    Minutes of Exercise per Session: 20 min  Stress: Stress Concern Present (06/26/2023)   [employer] of Occupational Health - Occupational Stress Questionnaire    Feeling of Stress : To some extent  Social Connections: Socially Integrated (06/26/2023)   Social Connection and Isolation Panel [NHANES]    Frequency of Communication with  Friends and Family: Twice a week    Frequency of Social Gatherings with Friends and Family: Once a week    Attends  Religious Services: 1 to 4 times per year    Active Member of [employer] or Organizations: Yes    Attends Banker Meetings: 1 to 4 times per year    Marital Status: Married  Catering manager Violence: Not At Risk (10/03/2023)   Humiliation, Afraid, Rape, and Kick questionnaire    Fear of Current or Ex-Partner: No    Emotionally Abused: No    Physically Abused: No    Sexually Abused: No    SUBJECTIVE  Review of Systems Constitutional: Patient denies any unintentional weight loss or change in strength lntegumentary: Patient denies any rashes or pruritus Cardiovascular: Patient denies chest pain or syncope Respiratory: Patient denies shortness of breath Gastrointestinal: As per HPI Musculoskeletal: Patient denies muscle cramps or weakness Neurologic: Patient states she is having trouble remembering things  GU: As per HPI.  OBJECTIVE Vitals:   10/12/23 1133  BP: 109/66  Pulse: 67   There is no height or weight on file to calculate BMI.  Physical Examination Constitutional: No obvious distress; patient is non-toxic appearing  Cardiovascular: No visible lower extremity edema.  Respiratory: The patient does not have audible wheezing/stridor; respirations do not appear labored  Gastrointestinal: Abdomen non-distended Musculoskeletal: Normal ROM of UEs  Skin: No obvious rashes/open sores  Neurologic: CN 2-12 grossly intact Psychiatric: Answered questions appropriately Hematologic/Lymphatic/Immunologic: No obvious bruises or sites of spontaneous bleeding  UA: Patient did not void in office today  PVR: 324 ml  ASSESSMENT Frequent urination - Plan: BLADDER SCAN AMB NON-IMAGING, CANCELED: Urinalysis, Routine w reflex microscopic  Atrophic vaginitis - Plan: Ambulatory referral to Gynecology  Midline cystocele - Plan: Ambulatory referral to Gynecology  Vaginal wall prolapse - Plan: Ambulatory referral to Gynecology  Kidney stone - Plan: US  RENAL  H/O: hysterectomy -  Plan: Ambulatory referral to Gynecology  Renal cyst - Plan: US  RENAL  OAB (overactive bladder)  Mixed stress and urge incontinence  Memory difficulty  We had a detailed discussed regarding multiple GU concerns:  Regarding voiding dysfunction with incomplete bladder emptying: Likely secondary to constipation at this time since she has been on Myrbetriq  for >2 years without incident. Discussed increased UTI risk with incomplete bladder emptying.  - Advised constipation management with OTC laxatives and/or stool softeners as needed to induce routine bowel movement at least once every 1-2 days.  - Advised nurse visit in 1 week for UA and PVR recheck. - Also advised to follow up with her Gastroenterologist if constipation continues to be a problem.  - May continue Myrbetriq  (Mirabegron ) 50 mg daily for now for baseline OAB with frequency, urgency, and urge incontinence; may need to discontinue that if incomplete bladder emptying persists following resolution of her constipation.  Stress urinary incontinence. Infrequent, not significantly bothersome per patient.  Vaginal atrophy:  We discussed the following: - The etiology and consequences of urogenital epithelial atrophy was explained to patient. The thinning of the epithelium of the urethra can contribute to urinary urgency and frequency syndromes. In addition, the normal bacterial flora that colonizes the perineum may contribute to UTI risk because the thin urethral epithelium allows the bacteria to become adherent and the change in vaginal pH can disrupt the vaginal / urethral microbiome and allow for bacterial overgrowth. - The normal bacterial flora that colonizes the perineum may contribute to UTI risk because the thin urethral epithelium allows the  bacteria to become adherent and the change in vaginal pH can disrupt the vaginal / urethral microbiome and allow for bacterial overgrowth.  - Topical vaginal estrogen replacement will take about  3 months to restore the vaginal pH. - Topical vaginal replacement may sting/burn initially due to severe dryness, which will improve with ongoing treatment as the tissue gets healthier.   She elected to restart topical vaginal estrogen cream use and accepted referral to GYN to re-establish care for both her vaginal atrophy and pelvic organ prolapse.  Discussed CT findings of non-obstructing right kidney stone and benign-appearing right renal cyst. Advised RUS for surveillance in 6 months.  She verbalized understanding and agreement. All questions were answered.   PLAN Advised the following: Constipation management as discussed. Restart topical vaginal estrogen cream use nightly x1 week then every other night long term. Return in about 1 week (around 10/19/2023) for nurse visit for PVR check. F/u with NP in 6 months with RUS prior. GYN referral placed. Follow up with GI specialist.  Orders Placed This Encounter  Procedures   US  RENAL    Standing Status:   Future    Expected Date:   04/13/2024    Expiration Date:   10/11/2024    Reason for Exam (SYMPTOM  OR DIAGNOSIS REQUIRED):   kidney stone known or suspected    Preferred imaging location?:   [facility]   Ambulatory referral to Gynecology    Referral Priority:   Routine    Referral Type:   Consultation    Referral Reason:   Specialty Services Required    Requested Specialty:   Gynecology    Number of Visits Requested:   1   BLADDER SCAN AMB NON-IMAGING   Total time spent caring for the patient today was over 60 minutes. This includes time spent on the date of the visit reviewing the patient's chart before the visit, time spent during the visit, and time spent after the visit on documentation. Over 50% of that time was spent in face-to-face time with this patient for direct counseling. E&M based on time and complexity of medical decision making.  It has been explained that the patient is to follow regularly with their PCP  in addition to all other providers involved in their care and to follow instructions provided by these respective offices. Patient advised to contact urology clinic if any urologic-pertaining questions, concerns, new symptoms or problems arise in the interim period.  Patient Instructions  Plan: 1. First priority is managing constipation because a full rectum can cause pressure anteriorly and result in difficulty emptying your bladder, like you experienced today while in the Urology office. Incomplete bladder emptying increases your risk for urinary tract infection (UTI). For constipation management please take over-the-counter laxatives and/or stool softeners daily or as needed to make sure you are having routine bowel movements every 1-2 days. Follow up with your Gastroenterologist if constipation continues to be a problem. 2. OK to continue Myrbetriq  (Mirabegron ) 50 mg daily for now; may need to discontinue that if you continue to be unable to empty your bladder adequately even once your constipation is resolved with routine bowel movements every 1-2 days. 3. Restart topical vaginal estrogen cream (estradiol, "Premarin") every night at bedtime to address vaginal dryness / atrophy, which can also increase UTI risk and exacerbated urinary urgency/frequency. Referral placed to GYN to re-establish care there.  4. No concerns regarding small right kidney stone and right renal cyst (appears benign). Will plan to follow up  on that with ultrasound in 6 months.  Electronically signed by: Lauretta Ponto, MSN, FNP-C, CUNP 10/12/2023 12:41 PM

## 2023-10-13 ENCOUNTER — Telehealth: Payer: Self-pay

## 2023-10-13 ENCOUNTER — Other Ambulatory Visit: Payer: Self-pay

## 2023-10-13 ENCOUNTER — Other Ambulatory Visit: Payer: Self-pay | Admitting: Family Medicine

## 2023-10-13 DIAGNOSIS — R1084 Generalized abdominal pain: Secondary | ICD-10-CM

## 2023-10-13 NOTE — Telephone Encounter (Signed)
 Copied from CRM [id number]. Topic: Clinical - Request for Lab/Test Order >> Oct 12, 2023  5:43 PM Courtney Rogers wrote: Reason for CRM: Patient would like to ask if the Dr could check her Courtney Rogers levels Pt says she is really tired and would like to figure out why her energy level is low  Pt num [phone] (H)  Ok to leave a detailed message

## 2023-10-14 ENCOUNTER — Other Ambulatory Visit: Payer: Self-pay | Admitting: Family Medicine

## 2023-10-14 DIAGNOSIS — R5383 Other fatigue: Secondary | ICD-10-CM

## 2023-10-17 NOTE — Telephone Encounter (Signed)
Pt informed

## 2023-10-18 ENCOUNTER — Other Ambulatory Visit: Payer: Self-pay | Admitting: Licensed Clinical Social Worker

## 2023-10-18 ENCOUNTER — Other Ambulatory Visit: Payer: Self-pay

## 2023-10-18 ENCOUNTER — Ambulatory Visit: Payer: Self-pay | Admitting: Family Medicine

## 2023-10-18 DIAGNOSIS — C9000 Multiple myeloma not having achieved remission: Secondary | ICD-10-CM

## 2023-10-18 LAB — ANA: Anti Nuclear Antibody (ANA): NEGATIVE

## 2023-10-18 NOTE — Patient Instructions (Signed)

## 2023-10-18 NOTE — Patient Outreach (Signed)
 Complex Care Management   Visit Note  10/18/2023  Name:  Courtney Rogers MRN: [id number] DOB: 19-Apr-1951  Situation: Referral received for Complex Care Management related to obtaining with personal care assistance I obtained verbal consent from Caregiver.  Visit completed with Albertine Hugh  on the phone. [name] reports [facility] [employer] [facility] began servicing pt last week. [name] reports Ms. Laye is receptive to paying for personal care assistance. LCSW. A Shasta Chinn encouraged Ms Joline Ned to contact pt health insurance to inquire about enhanced benefits to help with pca cost. LCSW A Naiyana Barbian provided [facility] pca services and [facility] pca contact information to asisst [name] with patient needs.   Background:   Past Medical History:  Diagnosis Date   Acute myocardial infarction [facility]) 2009   CAD/no stent medically managed   Allergy    Anxiety disorder    CAD (coronary artery disease)    Cancer (HCC)    multiple myeloma   CHF (congestive heart failure) (HCC) 956213   Glaucoma 086578   Hyperlipidemia    Hypertension    IBS (irritable bowel syndrome)    Non-ulcer dyspepsia 04/30/2009   Qualifier: Diagnosis of  By: Nolene Baumgarten MD, Sandi L    Overactive bladder    PE (pulmonary embolism) 10/2012   left lung   Presence of permanent cardiac pacemaker    Sleep apnea     Assessment: Patient Reported Symptoms:  Cognitive Cognitive Status: Unable to Assess Cognitive/Intellectual Conditions Management [RPT]: Not Assessed      Neurological Neurological Review of Symptoms: No symptoms reported    HEENT HEENT Symptoms Reported: No symptoms reported      Cardiovascular Cardiovascular Symptoms Reported: No symptoms reported    Respiratory Respiratory Symptoms Reported: No symptoms reported Respiratory Conditions: Asthma  Endocrine Patient reports the following symptoms related to hypoglycemia or hyperglycemia : No symptoms reported    Gastrointestinal Gastrointestinal  Symptoms Reported: No symptoms reported      Genitourinary Genitourinary Symptoms Reported: No symptoms reported Genitourinary Conditions: Other Other Genitourinary Conditions: overactive bladder  Integumentary Integumentary Symptoms Reported: No symptoms reported    Musculoskeletal Musculoskelatal Symptoms Reviewed: No symptoms reported   Falls in the past year?: Yes Number of falls in past year: 1 or less Was there an injury with Fall?: No Fall Risk Category Calculator: 1 Patient Fall Risk Level: Low Fall Risk    Psychosocial       Do you feel physically threatened by others?: No      09/25/2023    2:08 PM  Depression screen PHQ 2/9  Decreased Interest 3  Down, Depressed, Hopeless 3  PHQ - 2 Score 6  Altered sleeping 3  Tired, decreased energy 3  Change in appetite 0  Feeling bad or failure about yourself  0  Trouble concentrating 3  Moving slowly or fidgety/restless 3  Suicidal thoughts 0  PHQ-9 Score 18  Difficult doing work/chores Extremely dIfficult    There were no vitals filed for this visit.  Medications Reviewed Today     Reviewed by Fletcher Humble, LCSW (Social Worker) on 10/18/23 at 1447  Med List Status: <None>   Medication Order Taking? Sig Documenting Provider Last Dose Status Informant  acetaminophen  (TYLENOL ) 500 MG tablet [id number] No Take 1,000 mg by mouth 2 (two) times daily as needed for moderate pain or headache. [provider] Unknown Active Self  acyclovir  (ZOVIRAX ) 400 MG tablet [id number] No TAKE ONE TABLET BY MOUTH TWICE DAILY Paulett Boros, MD Unknown Active  B Complex-Folic Acid  (B COMPLEX VITAMINS, W/ FA,) CAPS [id number] No Take 1 capsule by mouth daily. [provider] Unknown Active   busPIRone  (BUSPAR ) 5 MG tablet [id number] No Take 5 mg by mouth daily. Craige Dixon, MD Unknown Active            Med Note Adra Alanis   Tue Jan 31, 2023  6:56 PM) In pill pack  Calcium Carb-Cholecalciferol 600-10 MG-MCG TABS  [id number] No Take 1 tablet by mouth 2 (two) times daily. [provider] Unknown Active   Calcium Carbonate-Vitamin D  (CALCIUM 600+D PO) [id number] No Take 1 tablet by mouth 2 (two) times daily. [provider] Unknown Active Self           Med Note Adra Alanis   Tue Jan 31, 2023  6:57 PM) In pill pack  chlorthalidone (HYGROTON) 25 MG tablet [id number] Yes Take 25 mg by mouth daily. [provider] Taking Active            Med Note Adra Alanis   Tue Jan 31, 2023  6:58 PM) In pill pack  cholecalciferol (VITAMIN D3) 10 MCG (400 UNIT) TABS tablet [id number] Yes Take 400 Units by mouth daily. [provider] Taking Active            Med Note Adra Alanis   Tue Jan 31, 2023  6:58 PM) In pill pack  citalopram  (CELEXA ) 20 MG tablet [id number] Yes Take 1 tablet (20 mg total) by mouth daily. Alison Irvine, FNP Taking Active   diclofenac  Sodium (VOLTAREN ) 1 % GEL [id number] Yes Apply 4 g topically 4 (four) times daily. Zarwolo, [name], FNP Taking Active            Med Note Adra Alanis   Tue Jan 31, 2023  6:40 PM) PRN  dicyclomine  (BENTYL ) 10 MG capsule [id number] Yes Take 1 capsule (10 mg total) by mouth 3 (three) times daily before meals. Alison Irvine, FNP Taking Active   FEROSUL 325 (65 Fe) MG tablet [id number] Yes Take 325 mg by mouth daily. [provider] Taking Active   fluticasone  (FLONASE ) 50 MCG/ACT nasal spray [id number] Yes USE 2 SPRAYS IN EACH NOSTRIL ONCE DAILY Zarwolo, [name], FNP Taking Active   gabapentin  (NEURONTIN ) 100 MG capsule [id number] Yes Take 2 capsules (200 mg total) by mouth 3 (three) times daily. Paulett Boros, MD Taking Active   GNP VITAMIN C 500 MG tablet [id number] Yes Take 500 mg by mouth daily. [provider] Taking Active   lenalidomide  (REVLIMID ) 10 MG capsule [id number] Yes Take 1 capsule (10 mg total) by mouth daily. 21 days on, 7 days off every 28 days Paulett Boros, MD Taking Active   levocetirizine  (XYZAL ) 5 MG tablet [id number] Yes TAKE ONE TABLET BY MOUTH IN THE EVENING Zarwolo, [name], FNP Taking Active   lidocaine  (LIDODERM ) 5 % [id number] Yes Place 1 patch onto the skin daily. Remove & Discard patch within 12 hours or as directed by MD Katherine Pancake, PA-C Taking Active   losartan  (COZAAR ) 100 MG tablet [id number] Yes TAKE ONE TABLET BY MOUTH ONCE DAILY Zarwolo, [name], FNP Taking Active   magnesium  oxide (MAG-OX) 400 (240 Mg) MG tablet [id number] Yes TAKE ONE TABLET BY MOUTH TWICE DAILY Paulett Boros, MD Taking Active   metoprolol  tartrate (LOPRESSOR ) 50 MG tablet [id number] Yes Take 1 tablet (50 mg total) by mouth 2 (two) times daily. Paseda, Folashade R, FNP Taking Active  Med Note Adra Alanis   Tue Jan 31, 2023  7:06 PM) In pill pack  montelukast  (SINGULAIR ) 10 MG tablet [id number] Yes TAKE ONE TABLET BY MOUTH AT BEDTIME Zarwolo, [name], FNP Taking Active   MYRBETRIQ  50 MG TB24 tablet [id number] Yes TAKE ONE TABLET BY MOUTH ONCE DAILY Zarwolo, [name], FNP Taking Active   Na Sulfate-K Sulfate-Mg Sulfate concentrate (SUPREP) 17.5-3.13-1.6 GM/177ML SOLN [id number] Yes As directed Carver, Charles K, DO Taking Active   nitroGLYCERIN (NITROSTAT) 0.4 MG SL tablet [id number] Yes Place 0.4 mg under the tongue every 5 (five) minutes as needed for chest pain. [provider] Taking Active Self  pantoprazole  (PROTONIX ) 40 MG tablet [id number] Yes TAKE ONE TABLET BY MOUTH TWICE DAILY Zarwolo, [name], FNP Taking Active   potassium chloride  SA (KLOR-CON  M) 20 MEQ tablet [id number] Yes Take 1 tablet (20 mEq total) by mouth 2 (two) times daily. Paulett Boros, MD Taking Active   PREMARIN vaginal cream [id number] Yes Place vaginally. [provider] Taking Active   primidone (MYSOLINE) 50 MG tablet [id number] Yes TAKE ONE TABLET BY MOUTH TWICE DAILY MORNING AND BEDTIME Zarwolo, [name], FNP Taking Active   Probiotic Product [facility] ADVANCED PROBIOTIC PO) [id number] Yes TAKE ONE  CAPSULE BY MOUTH EVERY DAY [provider] Taking Active   Probiotic Product (GNP PROBIOTIC COLON SUPPORT) CAPS [id number] Yes Take 1 capsule by mouth daily. [provider] Taking Active Self  simvastatin  (ZOCOR ) 20 MG tablet [id number] Yes Take 20 mg by mouth at bedtime. [provider] Taking Active   topiramate (TOPAMAX) 25 MG tablet [id number] Yes Take 25 mg by mouth 2 (two) times daily. [provider] Taking Active            Med Note Adra Alanis   Tue Jan 31, 2023  7:10 PM) In pill pack  warfarin (COUMADIN) 6 MG tablet [id number] Yes Take 6 mg by mouth at bedtime. [provider] Taking Active             Recommendation:   Pt daughter [name] will contact [facility] and [facility] to set up pca services. LCSW A Harmonee Tozer encouraged pt daughter to contact health insurance to inquire about enhanced benefits to assist with pca costs.   Follow Up Plan:   Patient has met all care management goals. Care Management case will be closed. Patient has been provided contact information should new needs arise.   Fletcher Humble MSW, LCSW Licensed Clinical Social Worker  [facility], Population Health Direct Dial: [phone]  Fax: [phone]

## 2023-10-19 ENCOUNTER — Inpatient Hospital Stay: Admitting: Dietician

## 2023-10-19 ENCOUNTER — Other Ambulatory Visit: Payer: Self-pay

## 2023-10-19 ENCOUNTER — Telehealth: Payer: Self-pay

## 2023-10-19 ENCOUNTER — Inpatient Hospital Stay

## 2023-10-19 ENCOUNTER — Inpatient Hospital Stay (HOSPITAL_BASED_OUTPATIENT_CLINIC_OR_DEPARTMENT_OTHER): Admitting: Hematology

## 2023-10-19 ENCOUNTER — Other Ambulatory Visit: Payer: Self-pay | Admitting: *Deleted

## 2023-10-19 ENCOUNTER — Inpatient Hospital Stay: Admitting: Hematology

## 2023-10-19 ENCOUNTER — Ambulatory Visit: Admitting: Family Medicine

## 2023-10-19 VITALS — BP 91/61 | HR 67 | Temp 97.9°F | Resp 16 | Wt 134.3 lb

## 2023-10-19 DIAGNOSIS — C9000 Multiple myeloma not having achieved remission: Secondary | ICD-10-CM

## 2023-10-19 DIAGNOSIS — Z5112 Encounter for antineoplastic immunotherapy: Secondary | ICD-10-CM | POA: Diagnosis not present

## 2023-10-19 DIAGNOSIS — D509 Iron deficiency anemia, unspecified: Secondary | ICD-10-CM | POA: Diagnosis not present

## 2023-10-19 DIAGNOSIS — E876 Hypokalemia: Secondary | ICD-10-CM

## 2023-10-19 DIAGNOSIS — F339 Major depressive disorder, recurrent, unspecified: Secondary | ICD-10-CM

## 2023-10-19 LAB — CBC WITH DIFFERENTIAL/PLATELET
Abs Immature Granulocytes: 0.02 10*3/uL (ref 0.00–0.07)
Basophils Absolute: 0.1 10*3/uL (ref 0.0–0.1)
Basophils Relative: 2 %
Eosinophils Absolute: 0.2 10*3/uL (ref 0.0–0.5)
Eosinophils Relative: 4 %
HCT: 33.5 % — ABNORMAL LOW (ref 36.0–46.0)
Hemoglobin: 11.6 g/dL — ABNORMAL LOW (ref 12.0–15.0)
Immature Granulocytes: 1 %
Lymphocytes Relative: 19 %
Lymphs Abs: 0.7 10*3/uL (ref 0.7–4.0)
MCH: 36 pg — ABNORMAL HIGH (ref 26.0–34.0)
MCHC: 34.6 g/dL (ref 30.0–36.0)
MCV: 104 fL — ABNORMAL HIGH (ref 80.0–100.0)
Monocytes Absolute: 0.6 10*3/uL (ref 0.1–1.0)
Monocytes Relative: 17 %
Neutro Abs: 2.2 10*3/uL (ref 1.7–7.7)
Neutrophils Relative %: 57 %
Platelets: 152 10*3/uL (ref 150–400)
RBC: 3.22 MIL/uL — ABNORMAL LOW (ref 3.87–5.11)
RDW: 12.9 % (ref 11.5–15.5)
WBC: 3.8 10*3/uL — ABNORMAL LOW (ref 4.0–10.5)
nRBC: 0 % (ref 0.0–0.2)

## 2023-10-19 LAB — COMPREHENSIVE METABOLIC PANEL WITH GFR
ALT: 32 U/L (ref 0–44)
AST: 34 U/L (ref 15–41)
Albumin: 3.5 g/dL (ref 3.5–5.0)
Alkaline Phosphatase: 45 U/L (ref 38–126)
Anion gap: 10 (ref 5–15)
BUN: 16 mg/dL (ref 8–23)
CO2: 25 mmol/L (ref 22–32)
Calcium: 8.6 mg/dL — ABNORMAL LOW (ref 8.9–10.3)
Chloride: 94 mmol/L — ABNORMAL LOW (ref 98–111)
Creatinine, Ser: 0.74 mg/dL (ref 0.44–1.00)
GFR, Estimated: >60 mL/min (ref >60)
Glucose, Bld: 104 mg/dL — ABNORMAL HIGH (ref 70–99)
Potassium: 3.2 mmol/L — ABNORMAL LOW (ref 3.5–5.1)
Sodium: 129 mmol/L — ABNORMAL LOW (ref 135–145)
Total Bilirubin: 0.5 mg/dL (ref 0.0–1.2)
Total Protein: 6.7 g/dL (ref 6.5–8.1)

## 2023-10-19 LAB — MAGNESIUM: Magnesium: 2 mg/dL (ref 1.7–2.4)

## 2023-10-19 MED ORDER — PREGABALIN 50 MG PO CAPS: 50.0000 mg | ORAL_CAPSULE | Freq: Two times a day (BID) | ORAL | 1 refills | Status: DC

## 2023-10-19 MED ORDER — CITALOPRAM HYDROBROMIDE 20 MG PO TABS: 20.0000 mg | ORAL_TABLET | Freq: Every day | ORAL | 3 refills | Status: DC

## 2023-10-19 MED ORDER — POTASSIUM CHLORIDE CRYS ER 20 MEQ PO TBCR
40.0000 meq | EXTENDED_RELEASE_TABLET | Freq: Once | ORAL | Status: AC
  Administered 2023-10-19: 40 meq via ORAL
  Filled 2023-10-19: qty 2

## 2023-10-19 MED ORDER — BORTEZOMIB CHEMO SQ INJECTION 3.5 MG (2.5MG/ML)
0.7000 mg/m2 | Freq: Once | INTRAMUSCULAR | Status: AC
  Administered 2023-10-19: 1.25 mg via SUBCUTANEOUS
  Filled 2023-10-19: qty 0.5

## 2023-10-19 MED ORDER — PROCHLORPERAZINE MALEATE 10 MG PO TABS
10.0000 mg | ORAL_TABLET | Freq: Four times a day (QID) | ORAL | Status: DC | PRN
  Administered 2023-10-19: 10 mg via ORAL
  Filled 2023-10-19: qty 1

## 2023-10-19 NOTE — Patient Instructions (Signed)
 [facility] [place] - A DEPT OF [facility]  Discharge Instructions: Thank you for choosing [facility] to provide your oncology and hematology care.  If you have a lab appointment with the Cancer Center - please note that after April 8th, 2024, all labs will be drawn in the cancer center.  You do not have to check in or register with the main entrance as you have in the past but will complete your check-in in the cancer center.  Wear comfortable clothing and clothing appropriate for easy access to any Portacath or PICC line.   We strive to give you quality time with your provider. You may need to reschedule your appointment if you arrive late (15 or more minutes).  Arriving late affects you and other patients whose appointments are after yours.  Also, if you miss three or more appointments without notifying the office, you may be dismissed from the clinic at the provider's discretion.      For prescription refill requests, have your pharmacy contact our office and allow 72 hours for refills to be completed.    Today you received the following chemotherapy and/or immunotherapy agents Velcade.  Bortezomib Injection What is this medication? BORTEZOMIB (bor TEZ oh mib) treats lymphoma. It may also be used to treat multiple myeloma, a type of bone marrow cancer. It works by blocking a protein that causes cancer cells to grow and multiply. This helps to slow or stop the spread of cancer cells. This medicine may be used for other purposes; ask your health care provider or pharmacist if you have questions. COMMON BRAND NAME(S): BORUZU, Velcade What should I tell my care team before I take this medication? They need to know if you have any of these conditions: Dehydration Diabetes Heart disease Liver disease Tingling of the fingers or toes or other nerve disorder An unusual or allergic reaction to bortezomib, other medications, foods, dyes, or preservatives If  you or your partner are pregnant or trying to get pregnant Breastfeeding How should I use this medication? This medication is injected into a vein or under the skin. It is given by your care team in a hospital or clinic setting. Talk to your care team about the use of this medication in children. Special care may be needed. Overdosage: If you think you have taken too much of this medicine contact a poison control center or emergency room at once. NOTE: This medicine is only for you. Do not share this medicine with others. What if I miss a dose? Keep appointments for follow-up doses. It is important not to miss your dose. Call your care team if you are unable to keep an appointment. What may interact with this medication? Ketoconazole Rifampin This list may not describe all possible interactions. Give your health care provider a list of all the medicines, herbs, non-prescription drugs, or dietary supplements you use. Also tell them if you smoke, drink alcohol, or use illegal drugs. Some items may interact with your medicine. What should I watch for while using this medication? Your condition will be monitored carefully while you are receiving this medication. You may need blood work while taking this medication. This medication may affect your coordination, reaction time, or judgment. Do not drive or operate machinery until you know how this medication affects you. Sit up or stand slowly to reduce the risk of dizzy or fainting spells. Drinking alcohol with this medication can increase the risk of these side effects.  This medication may increase your risk of getting an infection. Call your care team for advice if you get a fever, chills, sore throat, or other symptoms of a cold or flu. Do not treat yourself. Try to avoid being around people who are sick. Check with your care team if you have severe diarrhea, nausea, and vomiting, or if you sweat a lot. The loss of too much body fluid may make it  dangerous for you to take this medication. Talk to your care team if you may be pregnant. Serious birth defects can occur if you take this medication during pregnancy and for 7 months after the last dose. You will need a negative pregnancy test before starting this medication. Contraception is recommended while taking this medication and for 7 months after the last dose. Your care team can help you find the option that works for you. If your partner can get pregnant, use a condom during sex while taking this medication and for 4 months after the last dose. Do not breastfeed while taking this medication and for 2 months after the last dose. This medication may cause infertility. Talk to your care team if you are concerned about your fertility. What side effects may I notice from receiving this medication? Side effects that you should report to your care team as soon as possible: Allergic reactions--skin rash, itching, hives, swelling of the face, lips, tongue, or throat Bleeding--bloody or black, tar-like stools, vomiting blood or [place] material that looks like coffee grounds, red or dark [place] urine, small red or purple spots on skin, unusual bruising or bleeding Bleeding in the brain--severe headache, stiff neck, confusion, dizziness, change in vision, numbness or weakness of the face, arm, or leg, trouble speaking, trouble walking, vomiting Bowel blockage--stomach cramping, unable to have a bowel movement or pass gas, loss of appetite, vomiting Heart failure--shortness of breath, swelling of the ankles, feet, or hands, sudden weight gain, unusual weakness or fatigue Infection--fever, chills, cough, sore throat, wounds that don't heal, pain or trouble when passing urine, general feeling of discomfort or being unwell Liver injury--right upper belly pain, loss of appetite, nausea, light-colored stool, dark yellow or [place] urine, yellowing skin or eyes, unusual weakness or fatigue Low blood  pressure--dizziness, feeling faint or lightheaded, blurry vision Lung injury--shortness of breath or trouble breathing, cough, spitting up blood, chest pain, fever Pain, tingling, or numbness in the hands or feet Severe or prolonged diarrhea Stomach pain, bloody diarrhea, pale skin, unusual weakness or fatigue, decrease in the amount of urine, which may be signs of hemolytic uremic syndrome Sudden and severe headache, confusion, change in vision, seizures, which may be signs of posterior reversible encephalopathy syndrome (PRES) TTP--purple spots on the skin or inside the mouth, pale skin, yellowing skin or eyes, unusual weakness or fatigue, fever, fast or irregular heartbeat, confusion, change in vision, trouble speaking, trouble walking Tumor lysis syndrome (TLS)--nausea, vomiting, diarrhea, decrease in the amount of urine, dark urine, unusual weakness or fatigue, confusion, muscle pain or cramps, fast or irregular heartbeat, joint pain Side effects that usually do not require medical attention (report to your care team if they continue or are bothersome): Constipation Diarrhea Fatigue Loss of appetite Nausea This list may not describe all possible side effects. Call your doctor for medical advice about side effects. You may report side effects to FDA at 1-800-FDA-1088. Where should I keep my medication? This medication is given in a hospital or clinic. It will not be stored at home. NOTE: This sheet  is a summary. It may not cover all possible information. If you have questions about this medicine, talk to your doctor, pharmacist, or health care provider.  2024 Elsevier/Gold Standard (2021-10-12 00:00:00)       To help prevent nausea and vomiting after your treatment, we encourage you to take your nausea medication as directed.  BELOW ARE SYMPTOMS THAT SHOULD BE REPORTED IMMEDIATELY: *FEVER GREATER THAN 100.4 F (38 C) OR HIGHER *CHILLS OR SWEATING *NAUSEA AND VOMITING THAT IS NOT  CONTROLLED WITH YOUR NAUSEA MEDICATION *UNUSUAL SHORTNESS OF BREATH *UNUSUAL BRUISING OR BLEEDING *URINARY PROBLEMS (pain or burning when urinating, or frequent urination) *BOWEL PROBLEMS (unusual diarrhea, constipation, pain near the anus) TENDERNESS IN MOUTH AND THROAT WITH OR WITHOUT PRESENCE OF ULCERS (sore throat, sores in mouth, or a toothache) UNUSUAL RASH, SWELLING OR PAIN  UNUSUAL VAGINAL DISCHARGE OR ITCHING   Items with * indicate a potential emergency and should be followed up as soon as possible or go to the Emergency Department if any problems should occur.  Please show the CHEMOTHERAPY ALERT CARD or IMMUNOTHERAPY ALERT CARD at check-in to the Emergency Department and triage nurse.  Should you have questions after your visit or need to cancel or reschedule your appointment, please contact [facility]  - A DEPT OF [facility] [phone]  and follow the prompts.  Office hours are 8:00 a.m. to 4:30 p.m. Monday - Friday. Please note that voicemails left after 4:00 p.m. may not be returned until the following business day.  We are closed weekends and major holidays. You have access to a nurse at all times for urgent questions. Please call the main number to the clinic [phone] and follow the prompts.  For any non-urgent questions, you may also contact your provider using MyChart. We now offer e-Visits for anyone 29 and older to request care online for non-urgent symptoms. For details visit [email or web address].   Also download the MyChart app! Go to the app store, search "MyChart", open the app, select [place], and log in with your MyChart username and password.

## 2023-10-19 NOTE — Progress Notes (Signed)
 Patient presents today for Velcade  injection. Patient is in stable condition with complaints of fatigue and slight constipation.  MD aware.  Vital signs are stable.  Labs reviewed by Dr. Cheree Cords during the office visit and all labs are within treatment parameters.  Potassium today is 3.2.  We will give Klor Con 40 mEq PO x one dose today per standing orders by Dr. Cheree Cords.  We will proceed with treatment per MD orders.   Patient tolerated injection with no complaints voiced.  Site clean and dry with no bruising or swelling noted.  No complaints of pain.  Discharged with vital signs stable and no signs or symptoms of distress noted.

## 2023-10-19 NOTE — Progress Notes (Signed)
 Notified [employer] to remove Amlodipine  from pill pack.  Spoke with pharmacist Libby Ree.

## 2023-10-19 NOTE — Telephone Encounter (Signed)
 Notified Patient of prior authorization approval for Pregabalin 50 mg Capsules. Medication is approved through 10/18/2024. Pharmacy notified. No other needs or concerns noted at this time.

## 2023-10-19 NOTE — Telephone Encounter (Signed)
 Copied from CRM [id number]. Topic: Clinical - Prescription Issue >> Oct 18, 2023  5:13 PM Sophia H wrote: Reason for CRM: pt states depression meds were supposed to be changed on 05/22 and she has still not heard back from the pharmacy. Please reach out to the patient [phone]   [facility], [employer] - [place], [place] - [address]

## 2023-10-19 NOTE — Progress Notes (Signed)
 Nutrition Follow-up:  Pt with myeloma. She is receiving maintenance Velcade  and Revlimid . Patient is under the care of Dr. Cheree Cords   Met with patient in infusion. She reports not doing so well secondary to feeling tired. Patient reports good appetite. Reports sister prepares meals and eats "at least" 2 good meals. She drinks Boost and Glucerna. Patient does not care too much for the Glucerna. She is drinking tea and ~3 cups of water . Patient denies nausea, vomiting, diarrhea, constipation.    Medications: reviewed   Labs: glucose 129, K 3.2, glucose 104  Anthropometrics: Wt 134 lb 4.2 oz today - stable  5/15 - 134 lb 4.2 oz  5/1 - 133 lb 4.8 oz     NUTRITION DIAGNOSIS: Unintended wt loss - stable    INTERVENTION:  Encourage snacks in between meals - ideas provided Educated on nutrient profiles of ONS - given pt does not have DM/hyperglycemia recommend daily Boost for added calories and protein - Ensure samples + coupons provided     MONITORING, EVALUATION, GOAL: wt trends, intake   NEXT VISIT: To be scheduled as needed

## 2023-10-19 NOTE — Progress Notes (Addendum)
 [facility] [address], [place] [address]    Clinic Day:  10/19/2023  Referring physician: Zarwolo, [name], FNP  Patient Care Team: Zarwolo, [name], FNP as PCP - General (Family Medicine) Paulett Boros, MD as Medical Oncologist (Oncology) Wilson, Diane G, RN as Oncology Nurse Navigator (Oncology) Vinetta Greening, DO as Consulting Physician (Internal Medicine) Gaile Jourdain, Larene Pleasant, LCSW as [facility] Care Management (Licensed Clinical Social Worker) Fletcher Humble, LCSW as Child psychotherapist (Licensed Visual merchandiser)   ASSESSMENT & PLAN:   Assessment: 1.  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  Increased number of atypical plasma cells present 36% of all cell lines.  Plasma cells are kappa light chain restricted. -Unfortunately her specimen has reached cytogenetics and FISH lab week later due to bad weather and no viable plasma cells. -PET scan on 07/09/2019 showed no findings of active myeloma. -24-hour urine shows total protein 59 mg.  Urine immunofixation shows IgA kappa monoclonal protein.  LDH is 184. -Based on [facility] criteria she has high risk with about 45-50% probability of progression to myeloma in the next 2 years. -CTAP on 01/07/2020 shows acute superior endplate burst fracture of L1.  Mild associated paravertebral soft tissue thickening.  No other bone abnormalities. -Bone density test on 02/07/2020 shows T score -0.5. - Bone marrow biopsy on 07/12/2021: Hypercellular with increased number of atypical plasma cells representing 30% of cells, displaying kappa light chain restriction. - FISH panel: Gain of 1 q. (high risk), T p53 deletion (high risk), monosomy 13 (standard risk) - Cytogenetics: Complex karyotype. - PET scan (06/24/2021): New hypermetabolic bone lesions involving left anterior iliac crest, left mid humerus, right superior  pubic ramus, bilateral mid femurs, left calvarium.  Largest lytic lesion in the left iliac crest measuring 2.9 x 2.3 cm. - 2D echo on 07/29/2021: EF 50-55%.  There is distal septal and inferior apical hypokinesis.  (Carfilzomib based regimen was put on hold due to echo findings) - Dara VRD 6 cycles from 08/10/2021 through 11/30/2021 - Myeloma panel 12/07/2021, DIRA test was negative, M spike 0.2 g and light chain ratio normal. - Maintenance Velcade  every 2 weeks and Revlimid  3 weeks on 1 week off Started on 12/20/2021    2.  Social/family history: - She uses walker to ambulate since her right knee replacement leading to stiffness.  She lives with older sister and husband who has dementia. - She is independent of ADLs and IADLs.  She also drives     Plan: 1.  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 - She is also receiving Velcade  0.7 mg/m every 2 weeks. - I reviewed myeloma labs from 10/05/2023: M spike improved to 0.3 g.  FLC ratio is normal at 0.82 even though both kappa and lambda chains are elevated at 22 and 27 respectively. - Her blood pressure today is 91/61.  We will discontinue amlodipine . - Labs today: CBC grossly normal with mild leukopenia.  CMP is also stable with mild hyponatremia.  She will continue Velcade  today and in 2 weeks.  I will repeat myeloma labs in 2 weeks and see her back in 4 weeks for follow-up.  Will also check ferritin, iron panel, B12 and folic acid  at next visit.  2.  Peripheral neuropathy: - She is taking gabapentin  200 mg 3 times daily. - She reports neuropathic pains in the feet.  Hands feel like grits on them. - Will discontinue gabapentin .  Will start her on Lyrica  50 mg twice daily and titrate up as needed.   3.  Pulmonary embolism (2015): - She will continue Coumadin indefinitely.  No bleeding issues.  4.  ID  prophylaxis: - Continue clear twice daily for shingles prophylaxis.  5.  Myeloma bone disease: - Continue denosumab  monthly.  Calcium is 8.6.  6.  Right knee pain: - Continue tramadol  daily as needed.    Orders Placed This Encounter  Procedures   Iron and TIBC ([facility] ONLY)    Standing Status:   Future    Expected Date:   11/02/2023    Expiration Date:   10/18/2024   Ferritin    Standing Status:   Future    Expected Date:   11/02/2023    Expiration Date:   10/18/2024   Vitamin B12    Standing Status:   Future    Expected Date:   11/02/2023    Expiration Date:   10/18/2024   Folate    Standing Status:   Future    Expected Date:   11/02/2023    Expiration Date:   10/18/2024   Kappa/lambda light chains    Standing Status:   Future    Expected Date:   11/02/2023    Expiration Date:   10/18/2024   Protein electrophoresis, serum    Standing Status:   Future    Expected Date:   11/02/2023    Expiration Date:   10/18/2024     [name],acting as a scribe for Paulett Boros, MD.,have documented all relevant documentation on the behalf of Paulett Boros, MD,as directed by  Paulett Boros, MD while in the presence of Paulett Boros, MD.  I, Paulett Boros MD, have reviewed the above documentation for accuracy and completeness, and I agree with the above.     Paulett Boros, MD   5/29/20255:14 PM  CHIEF COMPLAINT:   Diagnosis: multiple myeloma    Cancer Staging  No matching staging information was found for the patient.    Prior Therapy: Dara VRD 6 cycles completed on 11/30/2021   Current Therapy:  Maintenance Velcade  every 2 weeks, and Revlimid  3 weeks on/1 week off    HISTORY OF PRESENT ILLNESS:   Oncology History  IgA myeloma (HCC)  05/06/2021 Initial Diagnosis   IgA myeloma (HCC)   08/10/2021 - 01/19/2022 Chemotherapy   Patient is on Treatment Plan : MYELOMA NEWLY DIAGNOSED TRANSPLANT CANDIDATE DaraVRd  (Daratumumab  SQ) q21d x 6 Cycles (Induction/Consolidation)     08/10/2021 -  Chemotherapy   Patient is on Treatment Plan : MYELOMA NEWLY DIAGNOSED TRANSPLANT CANDIDATE Velcade  D1 and D 15 q 28 days        INTERVAL HISTORY:   Ely is a 73 y.o. female presenting to clinic today for follow up of multiple myeloma. She was last seen by me on 09/21/23.  Larri has a colonoscopy scheduled for 11/03/23 with Dr. Mordechai April.   Her appetite level is at 100%. Her energy level is at 50%.  She reports numbness in the feet and a stinging sensation to the area. Nikelle is taking gabapentin  as prescribed and notes drowsiness. She does not believe the gabapentin  is effective in treating neuropathy as symptoms persist. She occasionally uses icy hot on her toes to temporarily relieve symptoms.  Desi is taking Revlimid  as prescribed and started her most recent cycle yesterday.   She notes tiredness and believes she may have had a fever last night. She is not taking hydrocodone  for right knee pain and has transitioned to tramadol . Amenda is taking coumadin as prescribed and denies any bleeding issues.   PAST MEDICAL HISTORY:   Past Medical History: Past Medical History:  Diagnosis Date   Acute myocardial infarction [facility]) 2009   CAD/no stent medically managed   Allergy    Anxiety disorder    CAD (coronary artery disease)    Cancer (HCC)    multiple myeloma   CHF (congestive heart failure) (HCC) 161096   Glaucoma 045409   Hyperlipidemia    Hypertension    IBS (irritable bowel syndrome)    Non-ulcer dyspepsia 04/30/2009   Qualifier: Diagnosis of  By: Nolene Baumgarten MD, Sandi L    Overactive bladder    PE (pulmonary embolism) 10/2012   left lung   Presence of permanent cardiac pacemaker    Sleep apnea     Surgical History: Past Surgical History:  Procedure Laterality Date   ABDOMINAL HYSTERECTOMY     BSO secondary to cyst     CARDIAC CATHETERIZATION     COLONOSCOPY  12/2009   Dr. Myrlene Asper,  propofol, normal. Next TCS 12/2019   COLONOSCOPY N/A 05/22/2017   Examined portion ileum was normal, significant looping of colon, external hemorrhoids and rectal bleeding due to internal hemorrhoids, mild diverticulosis procedure: COLONOSCOPY;  Surgeon: Alyce Jubilee, MD;  [facility];  Service: Endoscopy;  Laterality: N/A;  10:30am   ESOPHAGOGASTRODUODENOSCOPY  05/11/2009   schatzki ring/small hiatal hernia/path:gastritis   ESOPHAGOGASTRODUODENOSCOPY N/A 05/22/2017   Multiple gastric polyps with biopsy benign fundic gland polyp, small bowel biopsy negative for celiac, gastric biopsy with mild gastritis but no H. pylori procedure: ESOPHAGOGASTRODUODENOSCOPY (EGD);  Surgeon: Alyce Jubilee, MD;  [facility]: [facility];  Service: Endoscopy;  Laterality: N/A;   ESOPHAGOGASTRODUODENOSCOPY (EGD) WITH ESOPHAGEAL DILATION N/A 04/01/2013   WJX:[id number] at the gastroesophageal juction/multiple small polyps/mild gastritis   GIVENS CAPSULE STUDY N/A 06/07/2017   Frequent gastric erosions, normal small bowel mucosa.  Procedure: GIVENS CAPSULE STUDY;  Surgeon: Alyce Jubilee, MD;  [facility]: [facility];  Service: Endoscopy;  Laterality: N/A;  7:30am   INSERT / REPLACE / REMOVE PACEMAKER     Last year per pt.(can't remember date)   JOINT REPLACEMENT  040123   REPLACEMENT TOTAL KNEE Right 08/2020    Social History: Social History   Socioeconomic History   Marital status: Married    Spouse name: Doctor, general practice   Number of children: 3   Years of education: 16   Highest education level: Master's degree (e.g., MA, MS, MEng, MEd, MSW, MBA)  Occupational History   Not on file  Tobacco Use   Smoking status: Never    Passive exposure: Never   Smokeless tobacco: Never   Tobacco comments:    Never smoked   Vaping Use   Vaping status: Never Used  Substance and Sexual Activity   Alcohol use: Not Currently    Comment: occ wine   Drug use: No   Sexual activity: Not Currently  Other Topics  Concern   Not on file  Social History Narrative      Lives with husband-51 years 950 in Aug 2021   Daughter is close by, 2 sons live further away   One IN [place],ONE IN [place], ONE BESIDE HER.  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   Enjoys: reading, young adult      Diet: eats all food groups    Caffeine: coffee 1, tea daily soda-1 daily   Water : 1-2 cups      Wears seat belt    Does not use phone while driving   Smoke detectors at home    Licensed conveyancer -locked up      Left handed   One story home   Drinks caffeine   Social Drivers of Health   Financial Resource Strain: Low Risk  (06/26/2023)   Overall Financial Resource Strain (CARDIA)    Difficulty of Paying Living Expenses: Not hard at all  Food Insecurity: No Food Insecurity (10/18/2023)   Hunger Vital Sign    Worried About Running Out of Food in the Last Year: Never true    Ran Out of Food in the Last Year: Never true  Transportation Needs: No Transportation Needs (10/18/2023)   PRAPARE - Administrator, Civil Service (Medical): No    Lack of Transportation (Non-Medical): No  Physical Activity: Insufficiently Active (06/26/2023)   Exercise Vital Sign    Days of Exercise per Week: 2 days    Minutes of Exercise per Session: 20 min  Stress: Stress Concern Present (06/26/2023)   [employer] of Occupational Health - Occupational Stress Questionnaire    Feeling of Stress : To some extent  Social Connections: Socially Integrated (06/26/2023)   Social Connection and Isolation Panel [NHANES]    Frequency of Communication with Friends and Family: Twice a week    Frequency of Social Gatherings with Friends and Family: Once a week    Attends Religious Services: 1 to 4 times per year    Active Member of [employer] or Organizations: Yes    Attends Banker Meetings: 1 to 4 times per year    Marital Status: Married  Catering manager Violence: Not At Risk (10/18/2023)    Humiliation, Afraid, Rape, and Kick questionnaire    Fear of Current or Ex-Partner: No    Emotionally Abused: No    Physically Abused: No    Sexually Abused: No    Family History: Family History  Problem Relation Age of Onset   Anxiety disorder Mother    Cancer Mother    Heart disease Father    Hypertension Father    Colon polyps Neg Hx    Colon cancer Neg Hx     Current Medications:  Current Outpatient Medications:    acetaminophen  (TYLENOL ) 500 MG tablet, Take 1,000 mg by mouth 2 (two) times daily as needed for moderate pain or headache., Disp: , Rfl:    acyclovir  (ZOVIRAX ) 400 MG tablet, TAKE ONE TABLET BY MOUTH TWICE DAILY, Disp: 60 tablet, Rfl: 6   ASPIRIN LOW DOSE 81 MG tablet, Take 81 mg by mouth daily., Disp: , Rfl:    B Complex-Folic Acid  (B COMPLEX VITAMINS, W/ FA,) CAPS, Take 1 capsule by mouth daily., Disp: , Rfl:    busPIRone  (BUSPAR ) 5 MG tablet, Take 5 mg by mouth daily., Disp: , Rfl:    Calcium Carb-Cholecalciferol 600-10 MG-MCG TABS, Take 1 tablet by mouth 2 (two) times daily., Disp: , Rfl:    Calcium Carbonate-Vitamin D  (CALCIUM 600+D PO), Take 1 tablet by mouth 2 (two) times daily., Disp: , Rfl:    chlorthalidone (HYGROTON) 25 MG tablet, Take 25 mg by mouth daily., Disp: , Rfl:    cholecalciferol (VITAMIN D3) 10 MCG (  400 UNIT) TABS tablet, Take 400 Units by mouth daily., Disp: , Rfl:    cyanocobalamin (VITAMIN B12) 1000 MCG tablet, Take 1,000 mcg by mouth daily., Disp: , Rfl:    diclofenac  Sodium (VOLTAREN ) 1 % GEL, Apply 4 g topically 4 (four) times daily., Disp: 50 g, Rfl: 0   dicyclomine  (BENTYL ) 10 MG capsule, Take 1 capsule (10 mg total) by mouth 3 (three) times daily before meals., Disp: 90 capsule, Rfl: 2   FEROSUL 325 (65 Fe) MG tablet, Take 325 mg by mouth daily., Disp: , Rfl:    fluticasone  (FLONASE ) 50 MCG/ACT nasal spray, USE 2 SPRAYS IN EACH NOSTRIL ONCE DAILY, Disp: 16 g, Rfl: 0   GNP VITAMIN C 500 MG tablet, Take 500 mg by mouth daily., Disp: ,  Rfl:    lenalidomide  (REVLIMID ) 10 MG capsule, Take 1 capsule (10 mg total) by mouth daily. 21 days on, 7 days off every 28 days, Disp: 21 capsule, Rfl: 0   levocetirizine (XYZAL ) 5 MG tablet, TAKE ONE TABLET BY MOUTH IN THE EVENING, Disp: 30 tablet, Rfl: 3   lidocaine  (LIDODERM ) 5 %, Place 1 patch onto the skin daily. Remove & Discard patch within 12 hours or as directed by MD, Disp: 30 patch, Rfl: 0   losartan  (COZAAR ) 100 MG tablet, TAKE ONE TABLET BY MOUTH ONCE DAILY, Disp: 30 tablet, Rfl: 3   magnesium  oxide (MAG-OX) 400 (240 Mg) MG tablet, TAKE ONE TABLET BY MOUTH TWICE DAILY, Disp: 60 tablet, Rfl: 4   metoprolol  tartrate (LOPRESSOR ) 50 MG tablet, Take 1 tablet (50 mg total) by mouth 2 (two) times daily., Disp: 180 tablet, Rfl: 1   montelukast  (SINGULAIR ) 10 MG tablet, TAKE ONE TABLET BY MOUTH AT BEDTIME, Disp: 30 tablet, Rfl: 3   MYRBETRIQ  50 MG TB24 tablet, TAKE ONE TABLET BY MOUTH ONCE DAILY, Disp: 30 tablet, Rfl: 3   Na Sulfate-K Sulfate-Mg Sulfate concentrate (SUPREP) 17.5-3.13-1.6 GM/177ML SOLN, As directed, Disp: 354 mL, Rfl: 0   nitroGLYCERIN (NITROSTAT) 0.4 MG SL tablet, Place 0.4 mg under the tongue every 5 (five) minutes as needed for chest pain., Disp: , Rfl:    pantoprazole  (PROTONIX ) 40 MG tablet, TAKE ONE TABLET BY MOUTH TWICE DAILY, Disp: 90 tablet, Rfl: 3   potassium chloride  SA (KLOR-CON  M) 20 MEQ tablet, Take 1 tablet (20 mEq total) by mouth 2 (two) times daily., Disp: 60 tablet, Rfl: 3   PREMARIN vaginal cream, Place vaginally., Disp: , Rfl:    primidone (MYSOLINE) 50 MG tablet, TAKE ONE TABLET BY MOUTH TWICE DAILY MORNING AND BEDTIME, Disp: 60 tablet, Rfl: 5   Probiotic Product (GNP PROBIOTIC COLON SUPPORT) CAPS, Take 1 capsule by mouth daily., Disp: , Rfl:    simvastatin  (ZOCOR ) 20 MG tablet, Take 20 mg by mouth at bedtime., Disp: , Rfl:    topiramate (TOPAMAX) 25 MG tablet, Take 25 mg by mouth 2 (two) times daily., Disp: , Rfl:    warfarin (COUMADIN) 6 MG tablet, Take 6  mg by mouth at bedtime., Disp: , Rfl:    citalopram  (CELEXA ) 20 MG tablet, Take 1 tablet (20 mg total) by mouth daily., Disp: 30 tablet, Rfl: 3   pregabalin (LYRICA) 50 MG capsule, Take 1 capsule (50 mg total) by mouth 2 (two) times daily., Disp: 60 capsule, Rfl: 1   Allergies: Allergies  Allergen Reactions   Amoxicillin-Pot Clavulanate Other (See Comments)   Clarithromycin Other (See Comments)    Stomach problems   Erythromycin    Lisinopril Swelling  Amoxicillin Diarrhea    REVIEW OF SYSTEMS:   Review of Systems  Constitutional:  Negative for chills, fatigue and fever.  HENT:   Negative for lump/mass, mouth sores, nosebleeds, sore throat and trouble swallowing.   Eyes:  Negative for eye problems.  Respiratory:  Negative for cough and shortness of breath.   Cardiovascular:  Negative for chest pain, leg swelling and palpitations.  Gastrointestinal:  Positive for diarrhea. Negative for abdominal pain, constipation, nausea and vomiting.  Genitourinary:  Negative for bladder incontinence, difficulty urinating, dysuria, frequency, hematuria and nocturia.   Musculoskeletal:  Negative for arthralgias, back pain, flank pain, myalgias and neck pain.       +left upper arm pain, 8/10 severity  Skin:  Negative for itching and rash.  Neurological:  Positive for numbness. Negative for dizziness and headaches.  Hematological:  Does not bruise/bleed easily.  Psychiatric/Behavioral:  Positive for depression. Negative for sleep disturbance and suicidal ideas. The patient is nervous/anxious.   All other systems reviewed and are negative.    VITALS:   Blood pressure 91/61, pulse 67, temperature 97.9 F (36.6 C), temperature source Oral, resp. rate 16, weight 134 lb 4.2 oz (60.9 kg), SpO2 98%.  Wt Readings from Last 3 Encounters:  10/19/23 134 lb 4.2 oz (60.9 kg)  10/06/23 132 lb 1.3 oz (59.9 kg)  10/05/23 132 lb 12.8 oz (60.2 kg)    Body mass index is 23.78 kg/m.  Performance status  (ECOG): 1 - Symptomatic but completely ambulatory  PHYSICAL EXAM:   Physical Exam Vitals and nursing note reviewed. Exam conducted with a chaperone present.  Constitutional:      Appearance: Normal appearance.  Cardiovascular:     Rate and Rhythm: Normal rate and regular rhythm.     Pulses: Normal pulses.     Heart sounds: Normal heart sounds.  Pulmonary:     Effort: Pulmonary effort is normal.     Breath sounds: Normal breath sounds.  Abdominal:     Palpations: Abdomen is soft. There is no hepatomegaly, splenomegaly or mass.     Tenderness: There is no abdominal tenderness.  Musculoskeletal:     Right lower leg: No edema.     Left lower leg: No edema.  Lymphadenopathy:     Cervical: No cervical adenopathy.     Right cervical: No superficial, deep or posterior cervical adenopathy.    Left cervical: No superficial, deep or posterior cervical adenopathy.     Upper Body:     Right upper body: No supraclavicular or axillary adenopathy.     Left upper body: No supraclavicular or axillary adenopathy.  Neurological:     General: No focal deficit present.     Mental Status: She is alert and oriented to person, place, and time.  Psychiatric:        Mood and Affect: Mood normal.        Behavior: Behavior normal.     LABS:      Latest Ref Rng & Units 10/19/2023   10:05 AM 10/05/2023   11:46 AM 10/01/2023    1:25 PM  CBC  WBC 4.0 - 10.5 K/uL 3.8  4.1  4.4   Hemoglobin 12.0 - 15.0 g/dL 01.0  27.2  53.6   Hematocrit 36.0 - 46.0 % 33.5  33.5  35.0   Platelets 150 - 400 K/uL 152  140  124       Latest Ref Rng & Units 10/19/2023   10:05 AM 10/05/2023   11:46 AM 10/01/2023  1:25 PM  CMP  Glucose 70 - 99 mg/dL 161  096  94   BUN 8 - 23 mg/dL 16  18  17    Creatinine 0.44 - 1.00 mg/dL 0.45  4.09  8.11   Sodium 135 - 145 mmol/L 129  127  127   Potassium 3.5 - 5.1 mmol/L 3.2  3.3  3.4   Chloride 98 - 111 mmol/L 94  93  91   CO2 22 - 32 mmol/L 25  24  27    Calcium 8.9 - 10.3 mg/dL  8.6  8.4  8.7   Total Protein 6.5 - 8.1 g/dL 6.7  6.9    Total Bilirubin 0.0 - 1.2 mg/dL 0.5  0.8    Alkaline Phos 38 - 126 U/L 45  41    AST 15 - 41 U/L 34  29    ALT 0 - 44 U/L 32  33       No results found for: "CEA1", "CEA" / No results found for: "CEA1", "CEA" No results found for: "PSA1" No results found for: "CAN199" No results found for: "CAN125"  Lab Results  Component Value Date   TOTALPROTELP 6.2 10/05/2023   ALBUMINELP 3.5 10/05/2023   A1GS 0.3 10/05/2023   A2GS 0.7 10/05/2023   BETS 0.9 10/05/2023   GAMS 0.8 10/05/2023   MSPIKE 0.3 (H) 10/05/2023   SPEI Comment 10/05/2023   Lab Results  Component Value Date   TIBC 291 06/09/2021   TIBC 297 01/21/2021   TIBC 302 10/21/2020   FERRITIN 279 06/09/2021   FERRITIN 216 01/21/2021   FERRITIN 274 10/21/2020   IRONPCTSAT 24 06/09/2021   IRONPCTSAT 19 01/21/2021   IRONPCTSAT 28 10/21/2020   Lab Results  Component Value Date   LDH 129 03/02/2022   LDH 121 12/14/2021   LDH 113 11/09/2021     STUDIES:   DG Shoulder Left Result Date: 10/01/2023 CLINICAL DATA:  Several month history of left shoulder pain EXAM: LEFT SHOULDER - 3 VIEW COMPARISON:  None Available. FINDINGS: Partially imaged left chest wall pacemaker. Partially imaged leads are intact. There is no evidence of fracture or dislocation the left shoulder. Bony callus formation involving left lateral 6 and seventh ribs. IMPRESSION: 1. No acute fracture or dislocation of the left shoulder. 2. Bony callus formation involving left lateral 6 and seventh ribs, likely healing rib fractures. Electronically Signed   By: Limin  Xu M.D.   On: 10/01/2023 14:08   DG Chest Portable 1 View Result Date: 10/01/2023 CLINICAL DATA:  Pain in left shoulder. EXAM: PORTABLE CHEST 1 VIEW COMPARISON:  04/12/2023 FINDINGS: Left chest wall pacer device noted with leads in the right atrial appendage and right ventricle. Heart size and mediastinal contours are stable. No pleural fluid or  interstitial edema. Chronic scarring, calcifications architectural distortion within the right lower lung is unchanged compared with the previous exam. No superimposed airspace disease. The visualized osseous structures appear intact. IMPRESSION: 1. No acute findings. 2. Chronic scarring, calcifications and architectural distortion within the right lower lung. Unchanged from previous imaging. Electronically Signed   By: Kimberley Penman M.D.   On: 10/01/2023 14:02

## 2023-10-19 NOTE — Telephone Encounter (Signed)
 Pt states med never delivered. Resent med back to pharm with msg to deliver it to pt

## 2023-10-19 NOTE — Progress Notes (Signed)
 Patient has been examined by Dr. Ellin Saba. Vital signs and labs have been reviewed by MD - ANC, Creatinine, LFTs, hemoglobin, and platelets are within treatment parameters per M.D. - pt may proceed with treatment.  Primary RN and pharmacy notified.

## 2023-10-19 NOTE — Telephone Encounter (Signed)
 Copied from CRM [id number]. Topic: General - Other >> Oct 19, 2023  2:43 PM [place] wrote: Reason for CRM: Patient states she missed call, advised of the medication approval and it has been called in to pharmacy.

## 2023-10-19 NOTE — Patient Instructions (Signed)
 [facility] at [facility] Discharge Instructions   You were seen and examined today by Dr. Cheree Cords.  He reviewed the results of your lab work which are normal/stable.   Stop taking amlodipine .   We will proceed with your treatment today.   Return as scheduled.    Thank you for choosing [facility] at [facility] to provide your oncology and hematology care.  To afford each patient quality time with our provider, please arrive at least 15 minutes before your scheduled appointment time.   If you have a lab appointment with the Cancer Center please come in thru the Main Entrance and check in at the main information desk.  You need to re-schedule your appointment should you arrive 10 or more minutes late.  We strive to give you quality time with our providers, and arriving late affects you and other patients whose appointments are after yours.  Also, if you no show three or more times for appointments you may be dismissed from the clinic at the providers discretion.     Again, thank you for choosing [facility].  Our hope is that these requests will decrease the amount of time that you wait before being seen by our physicians.       _____________________________________________________________  Should you have questions after your visit to [facility], please contact our office at [phone] and follow the prompts.  Our office hours are 8:00 a.m. and 4:30 p.m. Monday - Friday.  Please note that voicemails left after 4:00 p.m. may not be returned until the following business day.  We are closed weekends and major holidays.  You do have access to a nurse 24-7, just call the main number to the clinic [phone] and do not press any options, hold on the line and a nurse will answer the phone.    For prescription refill requests, have your pharmacy contact our office and allow 72 hours.    Due to Covid, you will need to  wear a mask upon entering the hospital. If you do not have a mask, a mask will be given to you at the Main Entrance upon arrival. For doctor visits, patients may have 1 support person age 28 or older with them. For treatment visits, patients can not have anyone with them due to social distancing guidelines and our immunocompromised population.

## 2023-10-24 ENCOUNTER — Telehealth: Payer: Self-pay | Admitting: Family Medicine

## 2023-10-24 ENCOUNTER — Other Ambulatory Visit: Payer: Self-pay

## 2023-10-24 DIAGNOSIS — M5412 Radiculopathy, cervical region: Secondary | ICD-10-CM

## 2023-10-24 NOTE — Telephone Encounter (Signed)
 Copied from CRM [id number]. Topic: General - Other >> Oct 24, 2023 11:13 AM Emylou G wrote: Reason for CRM: Courtney w/[facility] Rcvd palliative referral.. will f/u care with him and will call if they need anything.

## 2023-10-25 ENCOUNTER — Ambulatory Visit

## 2023-10-25 ENCOUNTER — Ambulatory Visit: Admitting: Hematology

## 2023-10-25 ENCOUNTER — Other Ambulatory Visit

## 2023-10-26 NOTE — Telephone Encounter (Signed)
 Received vm from [place] [facility] that pt needs her instructions and asked if they could be faxed.  Called and spoke with pt and advised her that she could get the instructions printed at her pre-op appointment tomorrow 10/27/23. Pt verbalized understanding

## 2023-10-27 ENCOUNTER — Encounter (HOSPITAL_COMMUNITY)
Admission: RE | Admit: 2023-10-27 | Discharge: 2023-10-27 | Disposition: A | Discharge status: Home / Self Care | Source: Ambulatory Visit | Attending: Internal Medicine | Admitting: Internal Medicine

## 2023-10-27 ENCOUNTER — Other Ambulatory Visit: Payer: Self-pay | Admitting: Family Medicine

## 2023-10-30 ENCOUNTER — Ambulatory Visit

## 2023-10-30 DIAGNOSIS — R350 Frequency of micturition: Secondary | ICD-10-CM

## 2023-10-30 DIAGNOSIS — R339 Retention of urine, unspecified: Secondary | ICD-10-CM

## 2023-10-30 NOTE — Progress Notes (Signed)
 Simple Catheter Placement  Due to urinary retention patient is present today for a foley cath placement.  Patient was cleaned and prepped in a sterile fashion with Betadinex3 . A 16 FR foley catheter was inserted, urine return was noted  500 ml, urine was Dark yellow in color.  The balloon was filled with 10cc of sterile water .  A leg bag was attached for drainage. Patient was also given a night bag to take home and was given instruction on how to change from one bag to another.  Patient was given instruction on proper catheter care.  Patient tolerated well, no complications were noted   Performed by: Gorden Latino, CMA  Additional notes/ Follow up:  1 week VT

## 2023-11-01 ENCOUNTER — Telehealth: Payer: Self-pay

## 2023-11-01 ENCOUNTER — Inpatient Hospital Stay: Attending: Hematology

## 2023-11-01 ENCOUNTER — Inpatient Hospital Stay

## 2023-11-01 ENCOUNTER — Other Ambulatory Visit: Payer: Self-pay | Admitting: *Deleted

## 2023-11-01 VITALS — BP 107/65 | HR 64 | Temp 97.9°F | Resp 18 | Wt 131.0 lb

## 2023-11-01 DIAGNOSIS — Z86711 Personal history of pulmonary embolism: Secondary | ICD-10-CM | POA: Insufficient documentation

## 2023-11-01 DIAGNOSIS — Z7901 Long term (current) use of anticoagulants: Secondary | ICD-10-CM | POA: Diagnosis not present

## 2023-11-01 DIAGNOSIS — G629 Polyneuropathy, unspecified: Secondary | ICD-10-CM | POA: Insufficient documentation

## 2023-11-01 DIAGNOSIS — C9000 Multiple myeloma not having achieved remission: Secondary | ICD-10-CM | POA: Diagnosis present

## 2023-11-01 DIAGNOSIS — Z5112 Encounter for antineoplastic immunotherapy: Secondary | ICD-10-CM | POA: Diagnosis present

## 2023-11-01 DIAGNOSIS — Z79899 Other long term (current) drug therapy: Secondary | ICD-10-CM | POA: Insufficient documentation

## 2023-11-01 DIAGNOSIS — D509 Iron deficiency anemia, unspecified: Secondary | ICD-10-CM

## 2023-11-01 DIAGNOSIS — D696 Thrombocytopenia, unspecified: Secondary | ICD-10-CM | POA: Insufficient documentation

## 2023-11-01 LAB — COMPREHENSIVE METABOLIC PANEL WITH GFR
ALT: 39 U/L (ref 0–44)
AST: 32 U/L (ref 15–41)
Albumin: 3.4 g/dL — ABNORMAL LOW (ref 3.5–5.0)
Alkaline Phosphatase: 46 U/L (ref 38–126)
Anion gap: 10 (ref 5–15)
BUN: 23 mg/dL (ref 8–23)
CO2: 22 mmol/L (ref 22–32)
Calcium: 8.5 mg/dL — ABNORMAL LOW (ref 8.9–10.3)
Chloride: 106 mmol/L (ref 98–111)
Creatinine, Ser: 0.93 mg/dL (ref 0.44–1.00)
GFR, Estimated: >60 mL/min (ref >60)
Glucose, Bld: 87 mg/dL (ref 70–99)
Potassium: 3.5 mmol/L (ref 3.5–5.1)
Sodium: 138 mmol/L (ref 135–145)
Total Bilirubin: 0.6 mg/dL (ref 0.0–1.2)
Total Protein: 6.7 g/dL (ref 6.5–8.1)

## 2023-11-01 LAB — FERRITIN: Ferritin: 103 ng/mL (ref 11–307)

## 2023-11-01 LAB — IRON AND TIBC
Iron: 67 ug/dL (ref 28–170)
Saturation Ratios: 20 % (ref 10.4–31.8)
TIBC: 331 ug/dL (ref 250–450)
UIBC: 264 ug/dL

## 2023-11-01 LAB — FOLATE: Folate: >40 ng/mL (ref >5.9)

## 2023-11-01 LAB — VITAMIN B12: Vitamin B-12: 1458 pg/mL — ABNORMAL HIGH (ref 180–914)

## 2023-11-01 MED ORDER — LENALIDOMIDE 10 MG PO CAPS
ORAL_CAPSULE | ORAL | 0 refills | Status: DC
Start: 2023-11-01 — End: 2023-12-04

## 2023-11-01 MED ORDER — SODIUM CHLORIDE 0.9 % IV SOLN: Freq: Once | INTRAVENOUS | Status: AC

## 2023-11-01 MED ORDER — PROCHLORPERAZINE MALEATE 10 MG PO TABS
10.0000 mg | ORAL_TABLET | Freq: Once | ORAL | Status: AC
  Administered 2023-11-01: 10 mg via ORAL
  Filled 2023-11-01: qty 1

## 2023-11-01 MED ORDER — BORTEZOMIB CHEMO SQ INJECTION 3.5 MG (2.5MG/ML)
0.7000 mg/m2 | Freq: Once | INTRAMUSCULAR | Status: AC
  Administered 2023-11-01: 1.25 mg via SUBCUTANEOUS
  Filled 2023-11-01: qty 0.5

## 2023-11-01 MED ORDER — DENOSUMAB 120 MG/1.7ML ~~LOC~~ SOLN
120.0000 mg | Freq: Once | SUBCUTANEOUS | Status: AC
  Administered 2023-11-01: 120 mg via SUBCUTANEOUS
  Filled 2023-11-01: qty 1.7

## 2023-11-01 NOTE — Telephone Encounter (Signed)
 Copied from CRM [id number]. Topic: Clinical - Lab/Test Results >> Nov 01, 2023  9:49 AM Courtney Rogers wrote: Reason for CRM: pt called in and said she recently did a test and would like know the results of her [facility] [facility] [facility] test that was performed. Stated no one has ever reached out to relay results.   Pt callback [phone]

## 2023-11-01 NOTE — Patient Instructions (Signed)
 [facility] [place] - A DEPT OF [facility]  Discharge Instructions: Thank you for choosing [facility] to provide your oncology and hematology care.  If you have a lab appointment with the Cancer Center - please note that after April 8th, 2024, all labs will be drawn in the cancer center.  You do not have to check in or register with the main entrance as you have in the past but will complete your check-in in the cancer center.  Wear comfortable clothing and clothing appropriate for easy access to any Portacath or PICC line.   We strive to give you quality time with your provider. You may need to reschedule your appointment if you arrive late (15 or more minutes).  Arriving late affects you and other patients whose appointments are after yours.  Also, if you miss three or more appointments without notifying the office, you may be dismissed from the clinic at the provider's discretion.      For prescription refill requests, have your pharmacy contact our office and allow 72 hours for refills to be completed.    Today you received the following chemotherapy and/or immunotherapy agents Velcade , Xgeva  120 mg, and 1 L of normal saline of 2 hours.   To help prevent nausea and vomiting after your treatment, we encourage you to take your nausea medication as directed.  BELOW ARE SYMPTOMS THAT SHOULD BE REPORTED IMMEDIATELY: *FEVER GREATER THAN 100.4 F (38 C) OR HIGHER *CHILLS OR SWEATING *NAUSEA AND VOMITING THAT IS NOT CONTROLLED WITH YOUR NAUSEA MEDICATION *UNUSUAL SHORTNESS OF BREATH *UNUSUAL BRUISING OR BLEEDING *URINARY PROBLEMS (pain or burning when urinating, or frequent urination) *BOWEL PROBLEMS (unusual diarrhea, constipation, pain near the anus) TENDERNESS IN MOUTH AND THROAT WITH OR WITHOUT PRESENCE OF ULCERS (sore throat, sores in mouth, or a toothache) UNUSUAL RASH, SWELLING OR PAIN  UNUSUAL VAGINAL DISCHARGE OR ITCHING   Items with * indicate  a potential emergency and should be followed up as soon as possible or go to the Emergency Department if any problems should occur.  Please show the CHEMOTHERAPY ALERT CARD or IMMUNOTHERAPY ALERT CARD at check-in to the Emergency Department and triage nurse.  Should you have questions after your visit or need to cancel or reschedule your appointment, please contact [facility] [place] - A DEPT OF [facility] [phone]  and follow the prompts.  Office hours are 8:00 a.m. to 4:30 p.m. Monday - Friday. Please note that voicemails left after 4:00 p.m. may not be returned until the following business day.  We are closed weekends and major holidays. You have access to a nurse at all times for urgent questions. Please call the main number to the clinic [phone] and follow the prompts.  For any non-urgent questions, you may also contact your provider using MyChart. We now offer e-Visits for anyone 70 and older to request care online for non-urgent symptoms. For details visit [email or web address].   Also download the MyChart app! Go to the app store, search MyChart, open the app, select [place], and log in with your MyChart username and password.

## 2023-11-01 NOTE — Progress Notes (Signed)
 Patient presents today for Velcade  and Xgeva  injections.  Patient is in satisfactory condition with no new complaints voiced.  Patient's blood pressure noted to be 1st check 70/46, 2nd check 75/47, and 79/61. Dr.Katragadda made aware and stated to give 1 L of normal saline over 2 hours. Okay to use CMP from 10/19/22 for Xgeva  injection.Okay to proceed with treatment today. No labs are required prior to chemotherapy administration per treatment plan. We will proceed with treatment per MD orders. Blood pressure noted to be 107/65 before discharge.  Treatment given today per MD orders. Tolerated infusion without adverse affects. Vital signs stable. No complaints at this time. Discharged from clinic via wheelchair in stable condition. Alert and oriented x 3. F/U with [facility] as scheduled.

## 2023-11-01 NOTE — Telephone Encounter (Signed)
 Pt advised.

## 2023-11-02 ENCOUNTER — Inpatient Hospital Stay

## 2023-11-02 LAB — PROTEIN ELECTROPHORESIS, SERUM
A/G Ratio: 1.1 (ref 0.7–1.7)
Albumin ELP: 3.3 g/dL (ref 2.9–4.4)
Alpha-1-Globulin: 0.2 g/dL (ref 0.0–0.4)
Alpha-2-Globulin: 0.7 g/dL (ref 0.4–1.0)
Beta Globulin: 1 g/dL (ref 0.7–1.3)
Gamma Globulin: 1 g/dL (ref 0.4–1.8)
Globulin, Total: 3 g/dL (ref 2.2–3.9)
M-Spike, %: 0.4 g/dL — ABNORMAL HIGH
Total Protein ELP: 6.3 g/dL (ref 6.0–8.5)

## 2023-11-02 LAB — KAPPA/LAMBDA LIGHT CHAINS
Kappa free light chain: 30.7 mg/L — ABNORMAL HIGH (ref 3.3–19.4)
Kappa, lambda light chain ratio: 0.8 (ref 0.26–1.65)
Lambda free light chains: 38.5 mg/L — ABNORMAL HIGH (ref 5.7–26.3)

## 2023-11-03 ENCOUNTER — Encounter (HOSPITAL_COMMUNITY): Payer: Self-pay | Admitting: Internal Medicine

## 2023-11-03 ENCOUNTER — Ambulatory Visit (HOSPITAL_COMMUNITY): Admitting: Anesthesiology

## 2023-11-03 ENCOUNTER — Other Ambulatory Visit: Payer: Self-pay

## 2023-11-03 ENCOUNTER — Ambulatory Visit (HOSPITAL_COMMUNITY)
Admission: RE | Admit: 2023-11-03 | Discharge: 2023-11-03 | Disposition: A | Discharge status: Home / Self Care | Attending: Internal Medicine | Admitting: Internal Medicine

## 2023-11-03 ENCOUNTER — Encounter (HOSPITAL_COMMUNITY)
Admission: RE | Disposition: A | Discharge status: Home / Self Care | Payer: Self-pay | Source: Home / Self Care | Attending: Internal Medicine

## 2023-11-03 DIAGNOSIS — K644 Residual hemorrhoidal skin tags: Secondary | ICD-10-CM | POA: Diagnosis not present

## 2023-11-03 DIAGNOSIS — I110 Hypertensive heart disease with heart failure: Secondary | ICD-10-CM | POA: Insufficient documentation

## 2023-11-03 DIAGNOSIS — K317 Polyp of stomach and duodenum: Secondary | ICD-10-CM | POA: Diagnosis not present

## 2023-11-03 DIAGNOSIS — I509 Heart failure, unspecified: Secondary | ICD-10-CM | POA: Diagnosis not present

## 2023-11-03 DIAGNOSIS — K5730 Diverticulosis of large intestine without perforation or abscess without bleeding: Secondary | ICD-10-CM

## 2023-11-03 DIAGNOSIS — G4730 Sleep apnea, unspecified: Secondary | ICD-10-CM | POA: Diagnosis not present

## 2023-11-03 DIAGNOSIS — K648 Other hemorrhoids: Secondary | ICD-10-CM

## 2023-11-03 DIAGNOSIS — F32A Depression, unspecified: Secondary | ICD-10-CM | POA: Diagnosis not present

## 2023-11-03 DIAGNOSIS — I252 Old myocardial infarction: Secondary | ICD-10-CM | POA: Diagnosis not present

## 2023-11-03 DIAGNOSIS — Z950 Presence of cardiac pacemaker: Secondary | ICD-10-CM | POA: Diagnosis not present

## 2023-11-03 DIAGNOSIS — D131 Benign neoplasm of stomach: Secondary | ICD-10-CM

## 2023-11-03 DIAGNOSIS — F419 Anxiety disorder, unspecified: Secondary | ICD-10-CM | POA: Diagnosis not present

## 2023-11-03 DIAGNOSIS — K5900 Constipation, unspecified: Secondary | ICD-10-CM | POA: Insufficient documentation

## 2023-11-03 DIAGNOSIS — D123 Benign neoplasm of transverse colon: Secondary | ICD-10-CM | POA: Diagnosis not present

## 2023-11-03 DIAGNOSIS — K219 Gastro-esophageal reflux disease without esophagitis: Secondary | ICD-10-CM | POA: Insufficient documentation

## 2023-11-03 HISTORY — PX: ESOPHAGOGASTRODUODENOSCOPY: SHX5428

## 2023-11-03 HISTORY — PX: COLONOSCOPY: SHX5424

## 2023-11-03 SURGERY — COLONOSCOPY
Anesthesia: General

## 2023-11-03 MED ORDER — PROPOFOL 10 MG/ML IV BOLUS
INTRAVENOUS | Status: DC | PRN
  Administered 2023-11-03 (×3): 50 mg via INTRAVENOUS

## 2023-11-03 MED ORDER — LACTATED RINGERS IV SOLN: INTRAVENOUS | Status: DC

## 2023-11-03 MED ORDER — LIDOCAINE HCL 1 % IJ SOLN
INTRAMUSCULAR | Status: DC | PRN
  Administered 2023-11-03: 50 mg via INTRADERMAL

## 2023-11-03 MED ORDER — PROPOFOL 500 MG/50ML IV EMUL
INTRAVENOUS | Status: DC | PRN
  Administered 2023-11-03: 120 ug/kg/min via INTRAVENOUS

## 2023-11-03 MED ORDER — PHENYLEPHRINE 80 MCG/ML (10ML) SYRINGE FOR IV PUSH (FOR BLOOD PRESSURE SUPPORT)
PREFILLED_SYRINGE | INTRAVENOUS | Status: DC | PRN
  Administered 2023-11-03: 120 ug via INTRAVENOUS

## 2023-11-03 NOTE — Op Note (Signed)
 [facility] Patient Name: Courtney Rogers Procedure Date: 11/03/2023 1:18 PM MRN: [id number] Date of Birth: 16-Oct-1950 Attending MD: Rolando Cliche. Mordechai April , [place], [id number] CSN: [id number] Age: 73 Admit Type: Outpatient Procedure:                Upper GI endoscopy Indications:              Heartburn, Weight loss Providers:                Rolando Cliche. Mordechai April, DO, Graydon Lazier RN, RN, Theola Fitch Referring MD:              Medicines:                See the Anesthesia note for documentation of the                            administered medications Complications:            No immediate complications. Estimated Blood Loss:     Estimated blood loss was minimal. Procedure:                Pre-Anesthesia Assessment:                           - The anesthesia plan was to use monitored                            anesthesia care (MAC).                           After obtaining informed consent, the endoscope was                            passed under direct vision. Throughout the                            procedure, the patient's blood pressure, pulse, and                            oxygen saturations were monitored continuously. The                            GIF-H190 ([id number]) scope was introduced through the                            mouth, and advanced to the second part of duodenum.                            The upper GI endoscopy was accomplished without                            difficulty. The patient tolerated the procedure                            well. Scope In: 1:41:13  PM Scope Out: 1:45:33 PM Total Procedure Duration: 0 hours 4 minutes 20 seconds  Findings:      The Z-line was regular and was found 41 cm from the incisors.      Numerous (>30) 3 to 8 mm sessile polyps with no bleeding and no stigmata       of recent bleeding were found in the gastric fundus and in the gastric       body. Multiple polyps sampled with cold forceps for histology.       The duodenal bulb, first portion of the duodenum and second portion of       the duodenum were normal. Impression:               - Z-line regular, 41 cm from the incisors.                           - Numerous gastric polyps. Biopsied.                           - Normal duodenal bulb, first portion of the                            duodenum and second portion of the duodenum. Moderate Sedation:      Per Anesthesia Care Recommendation:           - Patient has a contact number available for                            emergencies. The signs and symptoms of potential                            delayed complications were discussed with the                            patient. Return to normal activities tomorrow.                            Written discharge instructions were provided to the                            patient.                           - Resume previous diet.                           - Continue present medications.                           - Await pathology results.                           - Return to GI clinic in 3 months. Procedure Code(s):        --- Professional ---                           [phone], Esophagogastroduodenoscopy, flexible,  transoral; with biopsy, single or multiple Diagnosis Code(s):        --- Professional ---                           K31.7, Polyp of stomach and duodenum                           R12, Heartburn                           R63.4, Abnormal weight loss CPT copyright 2022 American Medical Association. All rights reserved. The codes documented in this report are preliminary and upon coder review may  be revised to meet current compliance requirements. Rolando Cliche. Mordechai April, DO Rolando Cliche. Mordechai April, DO 11/03/2023 1:48:23 PM This report has been signed electronically. Number of Addenda: 0

## 2023-11-03 NOTE — Discharge Instructions (Addendum)
 EGD Discharge instructions Please read the instructions outlined below and refer to this sheet in the next few weeks. These discharge instructions provide you with general information on caring for yourself after you leave the hospital. Your doctor may also give you specific instructions. While your treatment has been planned according to the most current medical practices available, unavoidable complications occasionally occur. If you have any problems or questions after discharge, please call your doctor. ACTIVITY You may resume your regular activity but move at a slower pace for the next 24 hours.  Take frequent rest periods for the next 24 hours.  Walking will help expel (get rid of) the air and reduce the bloated feeling in your abdomen.  No driving for 24 hours (because of the anesthesia (medicine) used during the test).  You may shower.  Do not sign any important legal documents or operate any machinery for 24 hours (because of the anesthesia used during the test).  NUTRITION Drink plenty of fluids.  You may resume your normal diet.  Begin with a light meal and progress to your normal diet.  Avoid alcoholic beverages for 24 hours or as instructed by your caregiver.  MEDICATIONS You may resume your normal medications unless your caregiver tells you otherwise.  WHAT YOU CAN EXPECT TODAY You may experience abdominal discomfort such as a feeling of fullness or "gas" pains.  FOLLOW-UP Your doctor will discuss the results of your test with you.  SEEK IMMEDIATE MEDICAL ATTENTION IF ANY OF THE FOLLOWING OCCUR: Excessive nausea (feeling sick to your stomach) and/or vomiting.  Severe abdominal pain and distention (swelling).  Trouble swallowing.  Temperature over 101 F (37.8 C).  Rectal bleeding or vomiting of blood.      Colonoscopy Discharge Instructions  Read the instructions outlined below and refer to this sheet in the next few weeks. These discharge instructions provide you  with general information on caring for yourself after you leave the hospital. Your doctor may also give you specific instructions. While your treatment has been planned according to the most current medical practices available, unavoidable complications occasionally occur.   ACTIVITY You may resume your regular activity, but move at a slower pace for the next 24 hours.  Take frequent rest periods for the next 24 hours.  Walking will help get rid of the air and reduce the bloated feeling in your belly (abdomen).  No driving for 24 hours (because of the medicine (anesthesia) used during the test).   Do not sign any important legal documents or operate any machinery for 24 hours (because of the anesthesia used during the test).  NUTRITION Drink plenty of fluids.  You may resume your normal diet as instructed by your doctor.  Begin with a light meal and progress to your normal diet. Heavy or fried foods are harder to digest and may make you feel sick to your stomach (nauseated).  Avoid alcoholic beverages for 24 hours or as instructed.  MEDICATIONS You may resume your normal medications unless your doctor tells you otherwise.  WHAT YOU CAN EXPECT TODAY Some feelings of bloating in the abdomen.  Passage of more gas than usual.  Spotting of blood in your stool or on the toilet paper.  IF YOU HAD POLYPS REMOVED DURING THE COLONOSCOPY: No aspirin products for 7 days or as instructed.  No alcohol for 7 days or as instructed.  Eat a soft diet for the next 24 hours.  FINDING OUT THE RESULTS OF YOUR TEST Not all test results  are available during your visit. If your test results are not back during the visit, make an appointment with your caregiver to find out the results. Do not assume everything is normal if you have not heard from your caregiver or the medical facility. It is important for you to follow up on all of your test results.  SEEK IMMEDIATE MEDICAL ATTENTION IF: You have more than a  spotting of blood in your stool.  Your belly is swollen (abdominal distention).  You are nauseated or vomiting.  You have a temperature over 101.  You have abdominal pain or discomfort that is severe or gets worse throughout the day.   Your upper endoscopy revealed a normal-appearing esophagus.  You had numerous small polyps in your stomach.  I took samples of multiple of these.  We will call with these results.  Small bowel was normal.  Continue on pantoprazole .  Your colonoscopy revealed 1 polyp(s) which I removed successfully. Await pathology results, my office will contact you.  I do not think you need any further colonoscopies for surveillance purposes.  You also have diverticulosis and internal hemorrhoids. I would recommend increasing fiber in your diet or adding OTC Benefiber/Metamucil. Be sure to drink at least 4 to 6 glasses of water  daily. Follow-up with GI 8 weeks.   I hope you have a great rest of your week!  Rolando Cliche. Mordechai April, D.O. Gastroenterology and Hepatology [facility]

## 2023-11-03 NOTE — Transfer of Care (Signed)
 Immediate Anesthesia Transfer of Care Note  Patient: Courtney Rogers  Procedure(s) Performed: COLONOSCOPY EGD (ESOPHAGOGASTRODUODENOSCOPY)  Patient Location: Short Stay  Anesthesia Type:General  Level of Consciousness: awake  Airway & Oxygen Therapy: Patient Spontanous Breathing  Post-op Assessment: Report given to RN  Post vital signs: Reviewed and stable  Last Vitals:  Vitals Value Taken Time  BP    Temp    Pulse    Resp    SpO2      Last Pain:  Vitals:   11/03/23 1334  TempSrc:   PainSc: 0-No pain         Complications: No notable events documented.

## 2023-11-03 NOTE — H&P (Signed)
 Primary Care Physician:  Zarwolo, [name], FNP Primary Gastroenterologist:  Dr. Mordechai April  Pre-Procedure History & Physical: HPI:  Courtney Rogers is a 73 y.o. female is here for an EGD and colonoscopy due to history of weight loss, GERD, right lower quadrant abdominal pain, constipation.  Past Medical History:  Diagnosis Date   Acute myocardial infarction [facility]) 2009   CAD/no stent medically managed   Allergy    Anxiety disorder    CAD (coronary artery disease)    Cancer (HCC)    multiple myeloma   CHF (congestive heart failure) (HCC) 098119   Glaucoma 147829   Hyperlipidemia    Hypertension    IBS (irritable bowel syndrome)    Non-ulcer dyspepsia 04/30/2009   Qualifier: Diagnosis of  By: Nolene Baumgarten MD, Sandi L    Overactive bladder    PE (pulmonary embolism) 10/2012   left lung   Presence of permanent cardiac pacemaker    Sleep apnea     Past Surgical History:  Procedure Laterality Date   ABDOMINAL HYSTERECTOMY     BSO secondary to cyst     CARDIAC CATHETERIZATION     COLONOSCOPY  12/2009   Dr. Myrlene Asper, propofol, normal. Next TCS 12/2019   COLONOSCOPY N/A 05/22/2017   Examined portion ileum was normal, significant looping of colon, external hemorrhoids and rectal bleeding due to internal hemorrhoids, mild diverticulosis procedure: COLONOSCOPY;  Surgeon: Alyce Jubilee, MD;  [facility]: [facility];  Service: Endoscopy;  Laterality: N/A;  10:30am   ESOPHAGOGASTRODUODENOSCOPY  05/11/2009   schatzki ring/small hiatal hernia/path:gastritis   ESOPHAGOGASTRODUODENOSCOPY N/A 05/22/2017   Multiple gastric polyps with biopsy benign fundic gland polyp, small bowel biopsy negative for celiac, gastric biopsy with mild gastritis but no H. pylori procedure: ESOPHAGOGASTRODUODENOSCOPY (EGD);  Surgeon: Alyce Jubilee, MD;  Location: [facility];  Service: Endoscopy;  Laterality: N/A;   ESOPHAGOGASTRODUODENOSCOPY (EGD) WITH ESOPHAGEAL DILATION N/A 04/01/2013   FAO:ZHYQMVHQI at the  gastroesophageal juction/multiple small polyps/mild gastritis   GIVENS CAPSULE STUDY N/A 06/07/2017   Frequent gastric erosions, normal small bowel mucosa.  Procedure: GIVENS CAPSULE STUDY;  Surgeon: Alyce Jubilee, MD;  Location: [facility];  Service: Endoscopy;  Laterality: N/A;  7:30am   INSERT / REPLACE / REMOVE PACEMAKER     Last year per pt.(can't remember date)   JOINT REPLACEMENT  [phone]   REPLACEMENT TOTAL KNEE Right 08/2020    Prior to Admission medications   Medication Sig Start Date End Date Taking? Authorizing Provider  acyclovir  (ZOVIRAX ) 400 MG tablet TAKE ONE TABLET BY MOUTH TWICE DAILY 07/04/23  Yes Katragadda, Sreedhar, MD  busPIRone  (BUSPAR ) 5 MG tablet Take 5 mg by mouth daily.   Yes Craige Dixon, MD  levocetirizine (XYZAL ) 5 MG tablet TAKE ONE TABLET BY MOUTH IN THE EVENING 08/18/23  Yes Zarwolo, [name], FNP  losartan  (COZAAR ) 100 MG tablet TAKE ONE TABLET BY MOUTH ONCE DAILY 08/18/23  Yes Zarwolo, [name], FNP  magnesium  oxide (MAG-OX) 400 (240 Mg) MG tablet TAKE ONE TABLET BY MOUTH TWICE DAILY 08/18/23  Yes Katragadda, Sreedhar, MD  metoprolol  tartrate (LOPRESSOR ) 50 MG tablet Take 1 tablet (50 mg total) by mouth 2 (two) times daily. 10/07/21  Yes Paseda, Folashade R, FNP  montelukast  (SINGULAIR ) 10 MG tablet TAKE ONE TABLET BY MOUTH AT BEDTIME 09/20/23  Yes Zarwolo, [name], FNP  MYRBETRIQ  50 MG TB24 tablet TAKE ONE TABLET BY MOUTH ONCE DAILY 09/20/23  Yes Zarwolo, [name], FNP  Na Sulfate-K Sulfate-Mg Sulfate concentrate (SUPREP) 17.5-3.13-1.6 GM/177ML SOLN As  directed 10/11/23  Yes Yandell Mcjunkins K, DO  pantoprazole  (PROTONIX ) 40 MG tablet TAKE ONE TABLET BY MOUTH TWICE DAILY 10/13/23  Yes Zarwolo, [name], FNP  potassium chloride  SA (KLOR-CON  M) 20 MEQ tablet Take 1 tablet (20 mEq total) by mouth 2 (two) times daily. 09/04/23  Yes Paulett Boros, MD  pregabalin  (LYRICA ) 50 MG capsule Take 1 capsule (50 mg total) by mouth 2 (two) times daily. 10/19/23  Yes Katragadda,  Sreedhar, MD  primidone (MYSOLINE) 50 MG tablet TAKE ONE TABLET BY MOUTH TWICE DAILY MORNING AND BEDTIME 06/05/23  Yes Zarwolo, [name], FNP  simvastatin  (ZOCOR ) 20 MG tablet Take 20 mg by mouth at bedtime. 09/20/23  Yes [provider]  topiramate (TOPAMAX) 25 MG tablet Take 25 mg by mouth 2 (two) times daily. 01/06/23  Yes [provider]  traMADol  (ULTRAM ) 50 MG tablet Take 1 tablet (50 mg total) by mouth every 6 (six) hours as needed. 10/27/23  Yes Zarwolo, [name], FNP  acetaminophen  (TYLENOL ) 500 MG tablet Take 1,000 mg by mouth 2 (two) times daily as needed for moderate pain or headache.    [provider]  ASPIRIN LOW DOSE 81 MG tablet Take 81 mg by mouth daily. 09/26/23   [provider]  B Complex-Folic Acid  (B COMPLEX VITAMINS, W/ FA,) CAPS Take 1 capsule by mouth daily. 09/09/21   [provider]  Calcium Carb-Cholecalciferol 600-10 MG-MCG TABS Take 1 tablet by mouth 2 (two) times daily. 08/31/23   [provider]  Calcium Carbonate-Vitamin D  (CALCIUM 600+D PO) Take 1 tablet by mouth 2 (two) times daily.    [provider]  chlorthalidone (HYGROTON) 25 MG tablet Take 25 mg by mouth daily. Stop taking today per Dr.Katragadda 06/04/22   [provider]  cholecalciferol (VITAMIN D3) 10 MCG (400 UNIT) TABS tablet Take 400 Units by mouth daily. 09/30/22   [provider]  citalopram  (CELEXA ) 20 MG tablet Take 1 tablet (20 mg total) by mouth daily. 10/19/23   Alison Irvine, FNP  cyanocobalamin (VITAMIN B12) 1000 MCG tablet Take 1,000 mcg by mouth daily. 09/26/23   [provider]  diclofenac  Sodium (VOLTAREN ) 1 % GEL Apply 4 g topically 4 (four) times daily. 11/19/21   Zarwolo, [name], FNP  dicyclomine  (BENTYL ) 10 MG capsule Take 1 capsule (10 mg total) by mouth 3 (three) times daily before meals. 10/06/23   Alison Irvine, FNP  FEROSUL 325 (65 Fe) MG tablet Take 325 mg by mouth daily. 07/28/23   [provider]   fluticasone  (FLONASE ) 50 MCG/ACT nasal spray USE 2 SPRAYS IN EACH NOSTRIL ONCE DAILY 06/21/23   Zarwolo, [name], FNP  GNP VITAMIN C 500 MG tablet Take 500 mg by mouth daily. 08/31/23   [provider]  lenalidomide  (REVLIMID ) 10 MG capsule Take 1 capsule (10 mg total) by mouth daily. 21 days on, 7 days off every 28 days 11/01/23   Paulett Boros, MD  lidocaine  (LIDODERM ) 5 % Place 1 patch onto the skin daily. Remove & Discard patch within 12 hours or as directed by MD 10/01/23   Idol, Julie, PA-C  nitroGLYCERIN (NITROSTAT) 0.4 MG SL tablet Place 0.4 mg under the tongue every 5 (five) minutes as needed for chest pain.    [provider]  PREMARIN vaginal cream Place vaginally. 09/19/23   [provider]  Probiotic Product (GNP PROBIOTIC COLON SUPPORT) CAPS Take 1 capsule by mouth daily. 07/08/21   [provider]  warfarin (COUMADIN) 6 MG tablet Take 6 mg  by mouth at bedtime. 08/04/23   [provider]    Allergies as of 10/11/2023 - Review Complete 10/10/2023  Allergen Reaction Noted   Amoxicillin-pot clavulanate Other (See Comments) 07/29/2020   Clarithromycin Other (See Comments) 12/02/2020   Erythromycin  04/02/2020   Lisinopril Swelling    Amoxicillin Diarrhea 09/20/2021    Family History  Problem Relation Age of Onset   Anxiety disorder Mother    Cancer Mother    Heart disease Father    Hypertension Father    Colon polyps Neg Hx    Colon cancer Neg Hx     Social History   Socioeconomic History   Marital status: Married    Spouse name: Doctor, general practice   Number of children: 3   Years of education: 16   Highest education level: Master's degree (e.g., MA, MS, MEng, MEd, MSW, MBA)  Occupational History   Not on file  Tobacco Use   Smoking status: Never    Passive exposure: Never   Smokeless tobacco: Never   Tobacco comments:    Never smoked   Vaping Use   Vaping status: Never Used  Substance and Sexual Activity   Alcohol use: Not  Currently    Comment: occ wine   Drug use: No   Sexual activity: Not Currently  Other Topics Concern   Not on file  Social History Narrative      Lives with husband-51 years 950 in Aug 2021   Daughter is close by, 2 sons live further away   One IN [place],ONE IN [place], ONE BESIDE HER.        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   Enjoys: reading, young adult      Diet: eats all food groups    Caffeine: coffee 1, tea daily soda-1 daily   Water : 1-2 cups      Wears seat belt    Does not use phone while driving   Smoke detectors at home    Licensed conveyancer -locked up      Left handed   One story home   Drinks caffeine   Social Drivers of Health   Financial Resource Strain: Low Risk  (06/26/2023)   Overall Financial Resource Strain (CARDIA)    Difficulty of Paying Living Expenses: Not hard at all  Food Insecurity: No Food Insecurity (10/18/2023)   Hunger Vital Sign    Worried About Running Out of Food in the Last Year: Never true    Ran Out of Food in the Last Year: Never true  Transportation Needs: No Transportation Needs (10/18/2023)   PRAPARE - Administrator, Civil Service (Medical): No    Lack of Transportation (Non-Medical): No  Physical Activity: Insufficiently Active (06/26/2023)   Exercise Vital Sign    Days of Exercise per Week: 2 days    Minutes of Exercise per Session: 20 min  Stress: Stress Concern Present (06/26/2023)   [employer] of Occupational Health - Occupational Stress Questionnaire    Feeling of Stress : To some extent  Social Connections: Socially Integrated (06/26/2023)   Social Connection and Isolation Panel    Frequency of Communication with Friends and Family: Twice a week    Frequency of Social Gatherings with Friends and Family: Once a week    Attends Religious Services: 1 to 4 times per year    Active Member of [employer] or Organizations: Yes    Attends Banker Meetings: 1 to 4 times per year  Marital Status: Married  Catering manager Violence: Not At Risk (10/18/2023)   Humiliation, Afraid, Rape, and Kick questionnaire    Fear of Current or Ex-Partner: No    Emotionally Abused: No    Physically Abused: No    Sexually Abused: No    Review of Systems: General: Negative for fever, chills, fatigue, weakness. Eyes: Negative for vision changes.  ENT: Negative for hoarseness, difficulty swallowing , nasal congestion. CV: Negative for chest pain, angina, palpitations, dyspnea on exertion, peripheral edema.  Respiratory: Negative for dyspnea at rest, dyspnea on exertion, cough, sputum, wheezing.  GI: See history of present illness. GU:  Negative for dysuria, hematuria, urinary incontinence, urinary frequency, nocturnal urination.  MS: Negative for joint pain, low back pain.  Derm: Negative for rash or itching.  Neuro: Negative for weakness, abnormal sensation, seizure, frequent headaches, memory loss, confusion.  Psych: Negative for anxiety, depression Endo: Negative for unusual weight change.  Heme: Negative for bruising or bleeding. Allergy: Negative for rash or hives.  Physical Exam: Vital signs in last 24 hours: Temp:  [98 F (36.7 C)] 98 F (36.7 C) (06/13 1305) Pulse Rate:  [61] 61 (06/13 1305) Resp:  [12] 12 (06/13 1305) BP: (102)/(61) 102/61 (06/13 1305) SpO2:  [100 %] 100 % (06/13 1305) Weight:  [59.4 kg] 59.4 kg (06/13 1305)   General:   Alert,  Well-developed, well-nourished, pleasant and cooperative in NAD Head:  Normocephalic and atraumatic. Eyes:  Sclera clear, no icterus.   Conjunctiva pink. Ears:  Normal auditory acuity. Nose:  No deformity, discharge,  or lesions. Msk:  Symmetrical without gross deformities. Normal posture. Extremities:  Without clubbing or edema. Neurologic:  Alert and  oriented x4;  grossly normal neurologically. Skin:  Intact without significant lesions or rashes. Psych:  Alert and cooperative. Normal mood and  affect.   Impression/Plan: Courtney Rogers is here  for an EGD and colonoscopy due to history of weight loss, GERD, right lower quadrant abdominal pain, constipation.  Risks, benefits, limitations, imponderables and alternatives regarding procedure have been reviewed with the patient. Questions have been answered. All parties agreeable.

## 2023-11-03 NOTE — OR Nursing (Signed)
 Attempted to contact patient and daughter. No answer. Patient was to arrive at 1100 for egd and colonoscopy.

## 2023-11-03 NOTE — Op Note (Signed)
 [facility] Patient Name: Courtney Rogers Procedure Date: 11/03/2023 1:18 PM MRN: [id number] Date of Birth: 05-12-1951 Attending MD: Rolando Cliche. Roel Clarity, [id number] CSN: [id number] Age: 73 Admit Type: Outpatient Procedure:                Colonoscopy Indications:              Constipation, Weight loss Providers:                Rolando Cliche. Mordechai April, DO, Graydon Lazier RN, RN, Theola Fitch Referring MD:              Medicines:                See the Anesthesia note for documentation of the                            administered medications Complications:            No immediate complications. Estimated Blood Loss:     Estimated blood loss was minimal. Procedure:                Pre-Anesthesia Assessment:                           - The anesthesia plan was to use monitored                            anesthesia care (MAC).                           After obtaining informed consent, the colonoscope                            was passed under direct vision. Throughout the                            procedure, the patient's blood pressure, pulse, and                            oxygen saturations were monitored continuously. The                            PCF-HQ190L ([id number]) scope was introduced through                            the anus and advanced to the the cecum, identified                            by appendiceal orifice and ileocecal valve. The                            colonoscopy was performed without difficulty. The                            patient tolerated the procedure well. The quality  of the bowel preparation was evaluated using the                            BBPS [facility] Bowel Preparation Scale) with scores                            of: Right Colon = 1 (portion of mucosa seen, but                            other areas not well seen due to staining, residual                            stool and/or opaque liquid),  Transverse Colon = 2                            (minor amount of residual staining, small fragments                            of stool and/or opaque liquid, but mucosa seen                            well) and Left Colon = 2 (minor amount of residual                            staining, small fragments of stool and/or opaque                            liquid, but mucosa seen well). The total BBPS score                            equals 5. The quality of the bowel preparation was                            inadequate. Scope In: 1:49:43 PM Scope Out: 2:02:44 PM Scope Withdrawal Time: 0 hours 6 minutes 35 seconds  Total Procedure Duration: 0 hours 13 minutes 1 second  Findings:      Hemorrhoids were found on perianal exam.      Non-bleeding internal hemorrhoids were found.      A few small-mouthed diverticula were found in the sigmoid colon.      A 5 mm polyp was found in the transverse colon. The polyp was sessile.       The polyp was removed with a cold snare. Resection and retrieval were       complete.      Semi-solid stool was found in the ascending colon and in the cecum. Impression:               - Preparation of the colon was inadequate.                           - Hemorrhoids found on perianal exam.                           - Non-bleeding internal  hemorrhoids.                           - Diverticulosis in the sigmoid colon.                           - One 5 mm polyp in the transverse colon, removed                            with a cold snare. Resected and retrieved.                           - Stool in the ascending colon and in the cecum. Moderate Sedation:      Per Anesthesia Care Recommendation:           - Patient has a contact number available for                            emergencies. The signs and symptoms of potential                            delayed complications were discussed with the                            patient. Return to normal activities tomorrow.                             Written discharge instructions were provided to the                            patient.                           - Resume previous diet.                           - Continue present medications.                           - Await pathology results.                           - No repeat colonoscopy due to age.                           - Return to GI clinic in 3 months.                           - Colon preparation technically inadequate in the                            cecum and ascending colon. However for today's                            examination, I think it will suffice. No evidence  of colon cancer seen on today's exam. No large                            polyps or masses. No inflammation/colitis                            throughout the entire colon. Procedure Code(s):        --- Professional ---                           [phone], Colonoscopy, flexible; with removal of                            tumor(s), polyp(s), or other lesion(s) by snare                            technique Diagnosis Code(s):        --- Professional ---                           K64.8, Other hemorrhoids                           D12.3, Benign neoplasm of transverse colon (hepatic                            flexure or splenic flexure)                           K59.00, Constipation, unspecified                           R63.4, Abnormal weight loss                           K57.30, Diverticulosis of large intestine without                            perforation or abscess without bleeding CPT copyright 2022 American Medical Association. All rights reserved. The codes documented in this report are preliminary and upon coder review may  be revised to meet current compliance requirements. Rolando Cliche. Mordechai April, DO Rolando Cliche. Mordechai April, DO 11/03/2023 2:07:15 PM This report has been signed electronically. Number of Addenda: 0

## 2023-11-03 NOTE — Anesthesia Preprocedure Evaluation (Addendum)
 Anesthesia Evaluation  Patient identified by MRN, date of birth, ID band Patient awake    Reviewed: Allergy & Precautions, H&P , NPO status , Patient's Chart, lab work & pertinent test results, reviewed documented beta blocker date and time   Airway Mallampati: II  TM Distance: >3 FB Neck ROM: full    Dental  (+) Caps, Dental Advisory Given All upper front are a bridge:   Pulmonary asthma , sleep apnea , PE   Pulmonary exam normal breath sounds clear to auscultation       Cardiovascular Exercise Tolerance: Good hypertension, + CAD, + Past MI and +CHF  Normal cardiovascular exam+ pacemaker  Rhythm:regular Rate:Normal     Neuro/Psych  PSYCHIATRIC DISORDERS Anxiety Depression    glaucoma  Neuromuscular disease    GI/Hepatic Neg liver ROS,GERD  ,,  Endo/Other  negative endocrine ROS    Renal/GU Renal disease  negative genitourinary   Musculoskeletal   Abdominal   Peds  Hematology negative hematology ROS (+)   Anesthesia Other Findings Palliative care  Reproductive/Obstetrics negative OB ROS                             Anesthesia Physical Anesthesia Plan  ASA: 3  Anesthesia Plan: General   Post-op Pain Management: Minimal or no pain anticipated   Induction: Intravenous  PONV Risk Score and Plan: Propofol infusion  Airway Management Planned: Natural Airway and Nasal Cannula  Additional Equipment: None  Intra-op Plan:   Post-operative Plan:   Informed Consent: I have reviewed the patients History and Physical, chart, labs and discussed the procedure including the risks, benefits and alternatives for the proposed anesthesia with the patient or authorized representative who has indicated his/her understanding and acceptance.     Dental Advisory Given  Plan Discussed with: CRNA  Anesthesia Plan Comments:         Anesthesia Quick Evaluation

## 2023-11-03 NOTE — Anesthesia Postprocedure Evaluation (Signed)
 Anesthesia Post Note  Patient: Courtney Rogers  Procedure(s) Performed: COLONOSCOPY EGD (ESOPHAGOGASTRODUODENOSCOPY)  Patient location during evaluation: PACU Anesthesia Type: General Level of consciousness: awake and alert Pain management: pain level controlled Vital Signs Assessment: post-procedure vital signs reviewed and stable Respiratory status: spontaneous breathing, nonlabored ventilation, respiratory function stable and patient connected to nasal cannula oxygen Cardiovascular status: blood pressure returned to baseline and stable Postop Assessment: no apparent nausea or vomiting Anesthetic complications: no   There were no known notable events for this encounter.   Last Vitals:  Vitals:   11/03/23 1412 11/03/23 1423  BP: (!) 94/53 (!) 101/50  Pulse: 61   Resp: 10   Temp: (!) 36.3 C   SpO2: 100%     Last Pain:  Vitals:   11/03/23 1412  TempSrc: Axillary  PainSc: Asleep                 Savvy Peeters L Mackinze Criado

## 2023-11-06 ENCOUNTER — Encounter (HOSPITAL_COMMUNITY): Payer: Self-pay | Admitting: Internal Medicine

## 2023-11-06 LAB — SURGICAL PATHOLOGY

## 2023-11-09 ENCOUNTER — Other Ambulatory Visit: Payer: Self-pay | Admitting: Hematology

## 2023-11-09 ENCOUNTER — Ambulatory Visit

## 2023-11-09 DIAGNOSIS — R339 Retention of urine, unspecified: Secondary | ICD-10-CM

## 2023-11-09 MED ORDER — CIPROFLOXACIN HCL 500 MG PO TABS
500.0000 mg | ORAL_TABLET | Freq: Once | ORAL | Status: AC
  Administered 2023-11-09: 500 mg via ORAL

## 2023-11-09 NOTE — Progress Notes (Signed)
 Fill and Pull Catheter Removal  Patient is present today for a catheter removal.  of sterile water  was instilled into the bladder when the patient felt the urge to urinate. 10ml of water  was then drained from the balloon.  A 16FR foley cath was removed from the bladder no complications were noted .  Foley catheter intact and time of removal. Patient as then given some time to void on their own.  Patient can void  20ml on their own after some time.  Patient tolerated well.  One oral prophylactic antibiotic given per MD orders  Performed by: Wyonia Hefty, CMA  Follow up/ Additional notes: Patient is scheduld for GYN appt in July.  Verbal from Griselda Lederer, FNP to replace catheter and have patient follow up in 1 month after GYN appt for repeat VT with provider.   Simple Catheter Placement  Due to urinary retention patient is present today for a foley cath placement.  Patient was cleaned and prepped in a sterile fashion with Betadinex3 . A 16 FR foley catheter was inserted, urine return was noted  , urine was clear in color.  The balloon was filled with 10cc of sterile water .  A leg bag was attached for drainage. Patient was also given a night bag to take home and was given instruction on how to change from one bag to another.  Patient was given instruction on proper catheter care.  Patient tolerated well, no complications were noted   Performed by: Wyonia Hefty, CMA  Additional notes/ Follow up:   as scheduled.

## 2023-11-13 ENCOUNTER — Encounter: Payer: Self-pay | Admitting: Ophthalmology

## 2023-11-13 ENCOUNTER — Ambulatory Visit: Payer: Self-pay | Admitting: Internal Medicine

## 2023-11-14 ENCOUNTER — Encounter: Payer: Self-pay | Admitting: Ophthalmology

## 2023-11-14 NOTE — Discharge Instructions (Signed)

## 2023-11-16 ENCOUNTER — Inpatient Hospital Stay

## 2023-11-16 ENCOUNTER — Other Ambulatory Visit: Payer: Self-pay

## 2023-11-16 ENCOUNTER — Inpatient Hospital Stay: Admitting: Hematology

## 2023-11-16 ENCOUNTER — Ambulatory Visit: Payer: Self-pay | Admitting: Anesthesiology

## 2023-11-16 ENCOUNTER — Encounter: Payer: Self-pay | Admitting: Ophthalmology

## 2023-11-16 ENCOUNTER — Encounter
Admission: RE | Disposition: A | Discharge status: Home / Self Care | Payer: Self-pay | Source: Home / Self Care | Attending: Ophthalmology

## 2023-11-16 ENCOUNTER — Ambulatory Visit
Admission: RE | Admit: 2023-11-16 | Discharge: 2023-11-16 | Disposition: A | Discharge status: Home / Self Care | Attending: Ophthalmology | Admitting: Ophthalmology

## 2023-11-16 DIAGNOSIS — Z86711 Personal history of pulmonary embolism: Secondary | ICD-10-CM | POA: Diagnosis not present

## 2023-11-16 DIAGNOSIS — I110 Hypertensive heart disease with heart failure: Secondary | ICD-10-CM | POA: Diagnosis not present

## 2023-11-16 DIAGNOSIS — I2510 Atherosclerotic heart disease of native coronary artery without angina pectoris: Secondary | ICD-10-CM | POA: Insufficient documentation

## 2023-11-16 DIAGNOSIS — Z7901 Long term (current) use of anticoagulants: Secondary | ICD-10-CM | POA: Insufficient documentation

## 2023-11-16 DIAGNOSIS — H2512 Age-related nuclear cataract, left eye: Secondary | ICD-10-CM | POA: Insufficient documentation

## 2023-11-16 DIAGNOSIS — I509 Heart failure, unspecified: Secondary | ICD-10-CM | POA: Insufficient documentation

## 2023-11-16 DIAGNOSIS — G4730 Sleep apnea, unspecified: Secondary | ICD-10-CM | POA: Diagnosis not present

## 2023-11-16 DIAGNOSIS — I252 Old myocardial infarction: Secondary | ICD-10-CM | POA: Insufficient documentation

## 2023-11-16 DIAGNOSIS — Z950 Presence of cardiac pacemaker: Secondary | ICD-10-CM | POA: Diagnosis not present

## 2023-11-16 HISTORY — PX: CATARACT EXTRACTION W/PHACO: SHX586

## 2023-11-16 SURGERY — PHACOEMULSIFICATION, CATARACT, WITH IOL INSERTION
Anesthesia: Monitor Anesthesia Care | Laterality: Left

## 2023-11-16 MED ORDER — TETRACAINE HCL 0.5 % OP SOLN
1.0000 [drp] | OPHTHALMIC | Status: DC | PRN
  Administered 2023-11-16 (×3): 1 [drp] via OPHTHALMIC

## 2023-11-16 MED ORDER — MIDAZOLAM HCL 2 MG/2ML IJ SOLN
INTRAMUSCULAR | Status: AC
Start: 2023-11-16 — End: 2023-11-16
  Filled 2023-11-16: qty 2

## 2023-11-16 MED ORDER — EPINEPHRINE PF 1 MG/ML IJ SOLN
INTRAMUSCULAR | Status: DC | PRN
  Administered 2023-11-16: 83 mL via OPHTHALMIC

## 2023-11-16 MED ORDER — MOXIFLOXACIN HCL 0.5 % OP SOLN
OPHTHALMIC | Status: DC | PRN
  Administered 2023-11-16: .2 mL via OPHTHALMIC

## 2023-11-16 MED ORDER — ARMC OPHTHALMIC DILATING DROPS
OPHTHALMIC | Status: AC
  Filled 2023-11-16: qty 0.5

## 2023-11-16 MED ORDER — LIDOCAINE HCL (PF) 2 % IJ SOLN
INTRAOCULAR | Status: DC | PRN
  Administered 2023-11-16: 4 mL via INTRAOCULAR

## 2023-11-16 MED ORDER — BRIMONIDINE TARTRATE-TIMOLOL 0.2-0.5 % OP SOLN
OPHTHALMIC | Status: DC | PRN
  Administered 2023-11-16: 1 [drp] via OPHTHALMIC

## 2023-11-16 MED ORDER — FENTANYL CITRATE (PF) 100 MCG/2ML IJ SOLN
INTRAMUSCULAR | Status: DC | PRN
  Administered 2023-11-16: 50 ug via INTRAVENOUS

## 2023-11-16 MED ORDER — ARMC OPHTHALMIC DILATING DROPS
1.0000 | OPHTHALMIC | Status: DC | PRN
  Administered 2023-11-16 (×3): 1 via OPHTHALMIC

## 2023-11-16 MED ORDER — MIDAZOLAM HCL 2 MG/2ML IJ SOLN
INTRAMUSCULAR | Status: DC | PRN
  Administered 2023-11-16: .5 mg via INTRAVENOUS

## 2023-11-16 MED ORDER — SIGHTPATH DOSE#1 BSS IO SOLN
INTRAOCULAR | Status: DC | PRN
  Administered 2023-11-16: 15 mL via INTRAOCULAR

## 2023-11-16 MED ORDER — SIGHTPATH DOSE#1 NA HYALUR & NA CHOND-NA HYALUR IO KIT
PACK | INTRAOCULAR | Status: DC | PRN
  Administered 2023-11-16: 1 via OPHTHALMIC

## 2023-11-16 MED ORDER — TETRACAINE HCL 0.5 % OP SOLN
OPHTHALMIC | Status: AC
  Filled 2023-11-16: qty 4

## 2023-11-16 MED ORDER — FENTANYL CITRATE (PF) 100 MCG/2ML IJ SOLN
INTRAMUSCULAR | Status: AC
  Filled 2023-11-16: qty 2

## 2023-11-16 SURGICAL SUPPLY — 12 items
CATARACT SUITE SIGHTPATH (MISCELLANEOUS) ×1 IMPLANT
DISSECTOR HYDRO NUCLEUS 50X22 (MISCELLANEOUS) ×1 IMPLANT
DRSG TEGADERM 2-3/8X2-3/4 SM (GAUZE/BANDAGES/DRESSINGS) ×1 IMPLANT
FEE CATARACT SUITE SIGHTPATH (MISCELLANEOUS) ×1 IMPLANT
GLOVE BIOGEL PI IND STRL 8 (GLOVE) ×1 IMPLANT
GLOVE SURG LX STRL 7.5 STRW (GLOVE) ×1 IMPLANT
GLOVE SURG PROTEXIS BL SZ6.5 (GLOVE) ×1 IMPLANT
GLOVE SURG SYN 6.5 PF PI BL (GLOVE) ×1 IMPLANT
LENS IOL TECNIS MONO 22.0 (Intraocular Lens) IMPLANT
NDL FILTER BLUNT 18X1 1/2 (NEEDLE) ×1 IMPLANT
NEEDLE FILTER BLUNT 18X1 1/2 (NEEDLE) ×1 IMPLANT
SYR 3ML LL SCALE MARK (SYRINGE) ×1 IMPLANT

## 2023-11-16 NOTE — H&P (Signed)
 [facility]   Primary Care Physician:  Zarwolo, [name], FNP Ophthalmologist: Dr. Feliciano Ober  Pre-Procedure History & Physical: HPI:  Courtney Rogers is a 73 y.o. female here for cataract surgery.   Past Medical History:  Diagnosis Date   Acute myocardial infarction [facility]) 2009   CAD/no stent medically managed   Allergy    Anxiety disorder    CAD (coronary artery disease)    Cancer (HCC)    multiple myeloma   CHF (congestive heart failure) (HCC) 959879   Glaucoma [id number]   Hyperlipidemia    Hypertension    IBS (irritable bowel syndrome)    Non-ulcer dyspepsia 04/30/2009   Qualifier: Diagnosis of  By: Harvey MD, Sandi L    Overactive bladder    PE (pulmonary embolism) 10/2012   left lung   Presence of permanent cardiac pacemaker    Sleep apnea     Past Surgical History:  Procedure Laterality Date   ABDOMINAL HYSTERECTOMY     BSO secondary to cyst     CARDIAC CATHETERIZATION     COLONOSCOPY  12/2009   Dr. Rhoda, propofol , normal. Next TCS 12/2019   COLONOSCOPY N/A 05/22/2017   Examined portion ileum was normal, significant looping of colon, external hemorrhoids and rectal bleeding due to internal hemorrhoids, mild diverticulosis procedure: COLONOSCOPY;  Surgeon: Harvey Margo CROME, MD;  [facility];  Service: Endoscopy;  Laterality: N/A;  10:30am   COLONOSCOPY N/A 11/03/2023   Procedure: COLONOSCOPY;  Surgeon: Cindie Carlin POUR, DO;  [facility]: [facility];  Service: Endoscopy;  Laterality: N/A;  1:00 pm, asa 3   ESOPHAGOGASTRODUODENOSCOPY  05/11/2009   schatzki ring/small hiatal hernia/path:gastritis   ESOPHAGOGASTRODUODENOSCOPY N/A 05/22/2017   Multiple gastric polyps with biopsy benign fundic gland polyp, small bowel biopsy negative for celiac, gastric biopsy with mild gastritis but no H. pylori procedure: ESOPHAGOGASTRODUODENOSCOPY (EGD);  Surgeon: Harvey Margo CROME, MD;  [facility];  Service: Endoscopy;  Laterality: N/A;    ESOPHAGOGASTRODUODENOSCOPY N/A 11/03/2023   Procedure: EGD (ESOPHAGOGASTRODUODENOSCOPY);  Surgeon: Cindie Carlin POUR, DO;  [facility];  Service: Endoscopy;  Laterality: N/A;   ESOPHAGOGASTRODUODENOSCOPY (EGD) WITH ESOPHAGEAL DILATION N/A 04/01/2013   DOQ:dumprulmz at the gastroesophageal juction/multiple small polyps/mild gastritis   GIVENS CAPSULE STUDY N/A 06/07/2017   Frequent gastric erosions, normal small bowel mucosa.  Procedure: GIVENS CAPSULE STUDY;  Surgeon: Harvey Margo CROME, MD;  [facility];  Service: Endoscopy;  Laterality: N/A;  7:30am   INSERT / REPLACE / REMOVE PACEMAKER     Last year per pt.(can't remember date)   JOINT REPLACEMENT  [phone]   REPLACEMENT TOTAL KNEE Right 08/2020    Prior to Admission medications   Medication Sig Start Date End Date Taking? Authorizing Provider  acetaminophen  (TYLENOL ) 500 MG tablet Take 1,000 mg by mouth 2 (two) times daily as needed for moderate pain or headache.    [provider]  acyclovir  (ZOVIRAX ) 400 MG tablet TAKE ONE TABLET BY MOUTH TWICE DAILY 07/04/23   Rogers Hai, MD  ASPIRIN LOW DOSE 81 MG tablet Take 81 mg by mouth daily. 09/26/23   [provider]  B Complex-Folic Acid  (B COMPLEX VITAMINS, W/ FA,) CAPS Take 1 capsule by mouth daily. 09/09/21   [provider]  busPIRone  (BUSPAR ) 5 MG tablet Take 5 mg by mouth daily.    Ailene Gambles, MD  Calcium Carb-Cholecalciferol 600-10 MG-MCG TABS Take 1 tablet by mouth 2 (two) times daily. 08/31/23   [provider]  Calcium Carbonate-Vitamin D  (CALCIUM 600+D PO) Take 1 tablet by mouth 2 (two) times daily.    [provider]  chlorthalidone (HYGROTON) 25 MG tablet Take 25 mg by mouth daily. Stop taking today per Dr.Katragadda Patient not taking: Reported on 11/13/2023 06/04/22   [provider]  cholecalciferol (VITAMIN D3) 10 MCG (400 UNIT) TABS tablet Take 400 Units by mouth daily. 09/30/22   [provider]  citalopram  (CELEXA ) 20 MG tablet Take 1 tablet (20 mg total) by mouth daily. 10/19/23   Bevely Doffing, FNP  cyanocobalamin (VITAMIN B12) 1000 MCG tablet Take 1,000 mcg by mouth daily. 09/26/23   [provider]  diclofenac  Sodium (VOLTAREN ) 1 % GEL Apply 4 g topically 4 (four) times daily. 11/19/21   Zarwolo, [name], FNP  dicyclomine  (BENTYL ) 10 MG capsule Take 1 capsule (10 mg total) by mouth 3 (three) times daily before meals. 10/06/23   Bevely Doffing, FNP  FEROSUL 325 (65 Fe) MG tablet Take 325 mg by mouth daily. 07/28/23   [provider]  fluticasone  (FLONASE ) 50 MCG/ACT nasal spray USE 2 SPRAYS IN EACH NOSTRIL ONCE DAILY 06/21/23   Zarwolo, [name], FNP  GNP VITAMIN C 500 MG tablet Take 500 mg by mouth daily. 08/31/23   [provider]  lenalidomide  (REVLIMID ) 10 MG capsule Take 1 capsule (10 mg total) by mouth daily. 21 days on, 7 days off every 28 days 11/01/23   Katragadda, Sreedhar, MD  levocetirizine (XYZAL ) 5 MG tablet TAKE ONE TABLET BY MOUTH IN THE EVENING 08/18/23   Zarwolo, [name], FNP  lidocaine  (LIDODERM ) 5 % Place 1 patch onto the skin daily. Remove & Discard patch within 12 hours or as directed by MD 10/01/23   Idol, Julie, PA-C  losartan  (COZAAR ) 100 MG tablet TAKE ONE TABLET BY MOUTH ONCE DAILY 08/18/23   Zarwolo, [name], FNP  magnesium  oxide (MAG-OX) 400 (240 Mg) MG tablet TAKE ONE TABLET BY MOUTH TWICE DAILY 08/18/23   Rogers Hai, MD  metoprolol  tartrate (LOPRESSOR ) 50 MG tablet Take 1 tablet (50 mg total) by mouth 2 (two) times daily. 10/07/21   Paseda, Folashade R, FNP  montelukast  (SINGULAIR ) 10 MG tablet TAKE ONE TABLET BY MOUTH AT BEDTIME 09/20/23   Zarwolo, [name], FNP  MYRBETRIQ  50 MG TB24 tablet TAKE ONE TABLET BY MOUTH ONCE DAILY 09/20/23   Zarwolo, [name], FNP  Na Sulfate-K Sulfate-Mg Sulfate concentrate (SUPREP) 17.5-3.13-1.6 GM/177ML SOLN As directed Patient not taking: Reported on 11/13/2023 10/11/23   Carver, Charles K, DO   nitroGLYCERIN (NITROSTAT) 0.4 MG SL tablet Place 0.4 mg under the tongue every 5 (five) minutes as needed for chest pain.    [provider]  pantoprazole  (PROTONIX ) 40 MG tablet TAKE ONE TABLET BY MOUTH TWICE DAILY 10/13/23   Zarwolo, [name], FNP  potassium chloride  SA (KLOR-CON  M) 20 MEQ tablet Take 1 tablet (20 mEq total) by mouth 2 (two) times daily. 09/04/23   Rogers Hai, MD  pregabalin  (LYRICA ) 50 MG capsule TAKE ONE CAPSULE BY MOUTH TWICE DAILY 11/09/23   Rogers Hai, MD  PREMARIN vaginal cream Place vaginally. Patient not taking: Reported on 11/13/2023 09/19/23   [provider]  primidone (MYSOLINE) 50 MG tablet TAKE ONE TABLET BY MOUTH TWICE DAILY MORNING AND BEDTIME 06/05/23   Zarwolo, [name], FNP  Probiotic Product (GNP PROBIOTIC COLON SUPPORT) CAPS Take 1 capsule by mouth daily. 07/08/21   [provider]  simvastatin  (ZOCOR ) 20 MG tablet Take 20 mg by mouth at bedtime. 09/20/23  [provider]  topiramate (TOPAMAX) 25 MG tablet Take 25 mg by mouth 2 (two) times daily. 01/06/23   [provider]  traMADol  (ULTRAM ) 50 MG tablet Take 1 tablet (50 mg total) by mouth every 6 (six) hours as needed. 10/27/23   Zarwolo, [name], FNP  warfarin (COUMADIN) 6 MG tablet Take 6 mg by mouth at bedtime. 08/04/23   [provider]    Allergies as of 10/23/2023 - Review Complete 10/19/2023  Allergen Reaction Noted   Amoxicillin-pot clavulanate Other (See Comments) 07/29/2020   Clarithromycin Other (See Comments) 12/02/2020   Erythromycin  04/02/2020   Lisinopril Swelling    Amoxicillin Diarrhea 09/20/2021    Family History  Problem Relation Age of Onset   Anxiety disorder Mother    Cancer Mother    Heart disease Father    Hypertension Father    Colon polyps Neg Hx    Colon cancer Neg Hx     Social History   Socioeconomic History   Marital status: Married    Spouse name: Doctor, general practice   Number of children: 3   Years of  education: 16   Highest education level: Master's degree (e.g., MA, MS, MEng, MEd, MSW, MBA)  Occupational History   Not on file  Tobacco Use   Smoking status: Never    Passive exposure: Never   Smokeless tobacco: Never   Tobacco comments:    Never smoked   Vaping Use   Vaping status: Never Used  Substance and Sexual Activity   Alcohol use: Not Currently    Comment: occ wine   Drug use: No   Sexual activity: Not Currently  Other Topics Concern   Not on file  Social History Narrative      Lives with husband-51 years 952 in Aug 2021   Daughter is close by, 2 sons live further away   One IN [place],ONE IN [place], ONE BESIDE HER.        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   Enjoys: reading, young adult      Diet: eats all food groups    Caffeine: coffee 1, tea daily soda-1 daily   Water : 1-2 cups      Wears seat belt    Does not use phone while driving   Smoke detectors at home    Licensed conveyancer -locked up      Left handed   One story home   Drinks caffeine   Social Drivers of Health   Financial Resource Strain: Low Risk  (06/26/2023)   Overall Financial Resource Strain (CARDIA)    Difficulty of Paying Living Expenses: Not hard at all  Food Insecurity: No Food Insecurity (10/18/2023)   Hunger Vital Sign    Worried About Running Out of Food in the Last Year: Never true    Ran Out of Food in the Last Year: Never true  Transportation Needs: No Transportation Needs (10/18/2023)   PRAPARE - Administrator, Civil Service (Medical): No    Lack of Transportation (Non-Medical): No  Physical Activity: Insufficiently Active (06/26/2023)   Exercise Vital Sign    Days of Exercise per Week: 2 days    Minutes of Exercise per Session: 20 min  Stress: Stress Concern Present (06/26/2023)   [employer] of Occupational Health - Occupational Stress Questionnaire    Feeling of Stress : To some extent  Social Connections: Socially Integrated  (06/26/2023)   Social Connection and Isolation Panel  Frequency of Communication with Friends and Family: Twice a week    Frequency of Social Gatherings with Friends and Family: Once a week    Attends Religious Services: 1 to 4 times per year    Active Member of [employer] or Organizations: Yes    Attends Banker Meetings: 1 to 4 times per year    Marital Status: Married  Catering manager Violence: Not At Risk (10/18/2023)   Humiliation, Afraid, Rape, and Kick questionnaire    Fear of Current or Ex-Partner: No    Emotionally Abused: No    Physically Abused: No    Sexually Abused: No    Review of Systems: See HPI, otherwise negative ROS  Physical Exam: Ht 5' 3 (1.6 m)   Wt 61.2 kg   BMI 23.91 kg/m  General:   Alert, cooperative in NAD Head:  Normocephalic and atraumatic. Respiratory:  Normal work of breathing. Cardiovascular:  RRR  Impression/Plan: Courtney Rogers is here for cataract surgery.  Risks, benefits, limitations, and alternatives regarding cataract surgery have been reviewed with the patient.  Questions have been answered.  All parties agreeable.   Feliciano Bryan Ober, MD  11/16/2023, 7:07 AM

## 2023-11-16 NOTE — Progress Notes (Signed)
 Anisa with [employer] called saying that they can not get in touch with patient to set up delivery. Spoke with [employer] and gave them the mobile number [phone] in chart to try to reach Mrs. Huezo. She states that she will try to reach out to Mrs. Rolston.

## 2023-11-16 NOTE — Transfer of Care (Signed)
 Immediate Anesthesia Transfer of Care Note  Patient: Courtney Rogers  Procedure(s) Performed: PHACOEMULSIFICATION, CATARACT, WITH IOL INSERTION 5.45, 00:43.2 (Left)  Patient Location: PACU  Anesthesia Type:MAC  Level of Consciousness: awake  Airway & Oxygen Therapy: Patient Spontanous Breathing  Post-op Assessment: Report given to RN and Post -op Vital signs reviewed and stable  Post vital signs: Reviewed and stable  Last Vitals:  Vitals Value Taken Time  BP 94/56 11/16/23 09:35  Temp 36.4 C 11/16/23 09:34  Pulse 60 11/16/23 09:37  Resp 14 11/16/23 09:37  SpO2 99 % 11/16/23 09:37  Vitals shown include unfiled device data.  Last Pain:  Vitals:   11/16/23 0934  TempSrc:   PainSc: 0-No pain         Complications: No notable events documented.

## 2023-11-16 NOTE — Anesthesia Preprocedure Evaluation (Addendum)
 Anesthesia Evaluation  Patient identified by MRN, date of birth, ID band Patient awake    Reviewed: Allergy & Precautions, NPO status , Patient's Chart, lab work & pertinent test results  History of Anesthesia Complications Negative for: history of anesthetic complications  Airway Mallampati: I  TM Distance: >3 FB Neck ROM: full    Dental no notable dental hx.    Pulmonary asthma , sleep apnea    Pulmonary exam normal        Cardiovascular hypertension, + CAD, + Past MI and +CHF  Normal cardiovascular exam+ pacemaker   ECHo 2023 IMPRESSIONS     1. No strain imaging done.   2. Distal septal and inferior apical hypokinesis . Left ventricular  ejection fraction, by estimation, is 50 to 55%. The left ventricle has low  normal function. The left ventricle demonstrates regional wall motion  abnormalities (see scoring  diagram/findings for description). The left ventricular internal cavity  size was mildly dilated. Left ventricular diastolic parameters were  normal.   3. PPM wire in RA/RV. Right ventricular systolic function is normal. The  right ventricular size is normal. There is mildly elevated pulmonary  artery systolic pressure.   4. Left atrial size was mildly dilated.   5. The mitral valve is normal in structure. Trivial mitral valve  regurgitation. No evidence of mitral stenosis.   6. Small gradient across AV no AS . The aortic valve is tricuspid. There  is mild calcification of the aortic valve. Aortic valve regurgitation is  mild. Aortic valve sclerosis is present, with no evidence of aortic valve  stenosis.   7. The inferior vena cava is normal in size with greater than 50%  respiratory variability, suggesting right atrial pressure of 3 mmHg.      Neuro/Psych  PSYCHIATRIC DISORDERS Anxiety Depression     Neuromuscular disease    GI/Hepatic Neg liver ROS,GERD  Medicated,,  Endo/Other  negative endocrine ROS     Renal/GU      Musculoskeletal   Abdominal   Peds  Hematology  (+) Blood dyscrasia, anemia   Anesthesia Other Findings Past Medical History: 2009: Acute myocardial infarction [facility])     Comment:  CAD/no stent medically managed No date: Allergy No date: Anxiety disorder No date: CAD (coronary artery disease) No date: Cancer [facility])     Comment:  multiple myeloma 040120: CHF (congestive heart failure) (HCC) 967974: Glaucoma No date: Hyperlipidemia No date: Hypertension No date: IBS (irritable bowel syndrome) 04/30/2009: Non-ulcer dyspepsia     Comment:  Qualifier: Diagnosis of  By: Harvey MD, Sandi L  No date: Overactive bladder 10/2012: PE (pulmonary embolism)     Comment:  left lung No date: Presence of permanent cardiac pacemaker No date: Sleep apnea  Past Surgical History: No date: ABDOMINAL HYSTERECTOMY No date: BSO secondary to cyst No date: CARDIAC CATHETERIZATION 12/2009: COLONOSCOPY     Comment:  Dr. Rhoda, propofol , normal. Next TCS 12/2019 05/22/2017: COLONOSCOPY; N/A     Comment:  Examined portion ileum was normal, significant looping               of colon, external hemorrhoids and rectal bleeding due to              internal hemorrhoids, mild diverticulosis procedure:               COLONOSCOPY;  Surgeon: Harvey Margo CROME, MD;  [facility];  Service:  Endoscopy;  Laterality: N/A;                10:30am 11/03/2023: COLONOSCOPY; N/A     Comment:  Procedure: COLONOSCOPY;  Surgeon: Cindie Carlin POUR, DO;              [facility];  Service: Endoscopy;                Laterality: N/A;  1:00 pm, asa 3 05/11/2009: ESOPHAGOGASTRODUODENOSCOPY     Comment:  schatzki ring/small hiatal hernia/path:gastritis 05/22/2017: ESOPHAGOGASTRODUODENOSCOPY; N/A     Comment:  Multiple gastric polyps with biopsy benign fundic gland               polyp, small bowel biopsy negative for celiac, gastric               biopsy with mild gastritis but  no H. pylori procedure:               ESOPHAGOGASTRODUODENOSCOPY (EGD);  Surgeon: Harvey Margo CROME, MD;  [facility];  Service: Endoscopy;                Laterality: N/A; 11/03/2023: ESOPHAGOGASTRODUODENOSCOPY; N/A     Comment:  Procedure: EGD (ESOPHAGOGASTRODUODENOSCOPY);  Surgeon:               Cindie Carlin POUR, DO;  [facility];                Service: Endoscopy;  Laterality: N/A; 04/01/2013: ESOPHAGOGASTRODUODENOSCOPY (EGD) WITH ESOPHAGEAL  DILATION; N/A     Comment:  DOQ:dumprulmz at the gastroesophageal juction/multiple               small polyps/mild gastritis 06/07/2017: GIVENS CAPSULE STUDY; N/A     Comment:  Frequent gastric erosions, normal small bowel mucosa.                Procedure: GIVENS CAPSULE STUDY;  Surgeon: Harvey Margo CROME, MD;  [facility];  Service: Endoscopy;                Laterality: N/A;  7:30am No date: INSERT / REPLACE / REMOVE PACEMAKER     Comment:  Last year per pt.(can't remember date) 040123: JOINT REPLACEMENT 08/2020: REPLACEMENT TOTAL KNEE; Right  BMI    Body Mass Index: 23.91 kg/m      Reproductive/Obstetrics negative OB ROS                             Anesthesia Physical Anesthesia Plan  ASA: 3  Anesthesia Plan: MAC   Post-op Pain Management: Minimal or no pain anticipated   Induction: Intravenous  PONV Risk Score and Plan:   Airway Management Planned: Natural Airway and Nasal Cannula  Additional Equipment:   Intra-op Plan:   Post-operative Plan:   Informed Consent: I have reviewed the patients History and Physical, chart, labs and discussed the procedure including the risks, benefits and alternatives for the proposed anesthesia with the patient or authorized representative who has indicated his/her understanding and acceptance.     Dental Advisory Given  Plan Discussed with: Anesthesiologist, CRNA and Surgeon  Anesthesia Plan Comments:  (Patient consented for risks of anesthesia including but not limited to:  - adverse reactions to medications - damage to eyes,  teeth, lips or other oral mucosa - nerve damage due to positioning  - sore throat or hoarseness - Damage to heart, brain, nerves, lungs, other parts of body or loss of life  Patient voiced understanding and assent.)        Anesthesia Quick Evaluation

## 2023-11-16 NOTE — Op Note (Signed)
 OPERATIVE NOTE  WANNA GULLY [id number] 11/16/2023   PREOPERATIVE DIAGNOSIS: Nuclear sclerotic cataract left eye. H25.12   POSTOPERATIVE DIAGNOSIS: Nuclear sclerotic cataract left eye. H25.12   PROCEDURE:  Phacoemusification with posterior chamber intraocular lens placement of the left eye  Ultrasound time: Procedure(s): PHACOEMULSIFICATION, CATARACT, WITH IOL INSERTION 5.45, 00:43.2 (Left)  LENS:   Implant Name Type Inv. Item Serial No. Manufacturer Lot No. LRB No. Used Action  LENS IOL TECNIS MONO 22.0 - [id number] Intraocular Lens LENS IOL TECNIS MONO 22.0 [id number] [employer]  Left 1 Implanted      SURGEON:  Feliciano HERO. Enola, MD   ANESTHESIA:  Topical with tetracaine  drops, augmented with 1% preservative-free intracameral lidocaine .   COMPLICATIONS:  None.   DESCRIPTION OF PROCEDURE:  The patient was identified in the holding room and transported to the operating room and placed in the supine position under the operating microscope.  The left eye was identified as the operative eye, which was prepped and draped in the usual sterile ophthalmic fashion.   A 1 millimeter clear-corneal paracentesis was made inferotemporally. Preservative-free 1% lidocaine  mixed with 1:1,000 bisulfite-free aqueous solution of epinephrine was injected into the anterior chamber. The anterior chamber was then filled with Viscoat viscoelastic. A 2.4 millimeter keratome was used to make a clear-corneal incision superotemporally. A curvilinear capsulorrhexis was made with a cystotome and capsulorrhexis forceps. Balanced salt solution was used to hydrodissect and hydrodelineate the nucleus. Phacoemulsification was then used to remove the lens nucleus and epinucleus. The remaining cortex was then removed using the irrigation and aspiration handpiece. Provisc was then placed into the capsular bag to distend it for lens placement. A +22.00 D [id number] intraocular lens was then injected into the capsular bag. The  remaining viscoelastic was aspirated.   Wounds were hydrated with balanced salt solution.  The anterior chamber was inflated to a physiologic pressure with balanced salt solution.  No wound leaks were noted. Moxifloxacin was injected intracamerally.  Timolol and Brimonidine drops were applied to the eye.  The patient was taken to the recovery room in stable condition without complications of anesthesia or surgery.  Feliciano Hugger [place] 11/16/2023, 9:31 AM

## 2023-11-16 NOTE — Anesthesia Postprocedure Evaluation (Signed)
 Anesthesia Post Note  Patient: Courtney Rogers  Procedure(s) Performed: PHACOEMULSIFICATION, CATARACT, WITH IOL INSERTION 5.45, 00:43.2 (Left)  Patient location during evaluation: PACU Anesthesia Type: MAC Level of consciousness: awake and alert Pain management: pain level controlled Vital Signs Assessment: post-procedure vital signs reviewed and stable Respiratory status: spontaneous breathing, nonlabored ventilation, respiratory function stable and patient connected to nasal cannula oxygen Cardiovascular status: stable and blood pressure returned to baseline Postop Assessment: no apparent nausea or vomiting Anesthetic complications: no   No notable events documented.   Last Vitals:  Vitals:   11/16/23 0935 11/16/23 0938  BP: (!) 94/56 (!) 91/55  Pulse: 60 60  Resp: 10 12  Temp:    SpO2: 100% 99%    Last Pain:  Vitals:   11/16/23 0934  TempSrc:   PainSc: 0-No pain                 Lendia LITTIE Mae

## 2023-11-17 ENCOUNTER — Encounter: Payer: Self-pay | Admitting: Hematology

## 2023-11-17 ENCOUNTER — Other Ambulatory Visit: Payer: Self-pay

## 2023-11-17 ENCOUNTER — Encounter: Payer: Self-pay | Admitting: Ophthalmology

## 2023-11-17 DIAGNOSIS — E876 Hypokalemia: Secondary | ICD-10-CM

## 2023-11-17 DIAGNOSIS — D509 Iron deficiency anemia, unspecified: Secondary | ICD-10-CM

## 2023-11-17 DIAGNOSIS — C9000 Multiple myeloma not having achieved remission: Secondary | ICD-10-CM

## 2023-11-20 ENCOUNTER — Inpatient Hospital Stay

## 2023-11-20 ENCOUNTER — Inpatient Hospital Stay (HOSPITAL_BASED_OUTPATIENT_CLINIC_OR_DEPARTMENT_OTHER): Admitting: Hematology

## 2023-11-20 ENCOUNTER — Encounter: Payer: Self-pay | Admitting: Hematology

## 2023-11-20 ENCOUNTER — Other Ambulatory Visit: Payer: Self-pay | Admitting: *Deleted

## 2023-11-20 VITALS — BP 88/56 | HR 64 | Temp 96.8°F | Resp 20 | Wt 130.6 lb

## 2023-11-20 DIAGNOSIS — C9000 Multiple myeloma not having achieved remission: Secondary | ICD-10-CM

## 2023-11-20 DIAGNOSIS — E876 Hypokalemia: Secondary | ICD-10-CM

## 2023-11-20 DIAGNOSIS — D509 Iron deficiency anemia, unspecified: Secondary | ICD-10-CM

## 2023-11-20 LAB — CBC WITH DIFFERENTIAL/PLATELET
Abs Immature Granulocytes: 0.01 10*3/uL (ref 0.00–0.07)
Basophils Absolute: 0.1 10*3/uL (ref 0.0–0.1)
Basophils Relative: 1 %
Eosinophils Absolute: 0.1 10*3/uL (ref 0.0–0.5)
Eosinophils Relative: 2 %
HCT: 36.3 % (ref 36.0–46.0)
Hemoglobin: 11.8 g/dL — ABNORMAL LOW (ref 12.0–15.0)
Immature Granulocytes: 0 %
Lymphocytes Relative: 19 %
Lymphs Abs: 1.1 10*3/uL (ref 0.7–4.0)
MCH: 34.3 pg — ABNORMAL HIGH (ref 26.0–34.0)
MCHC: 32.5 g/dL (ref 30.0–36.0)
MCV: 105.5 fL — ABNORMAL HIGH (ref 80.0–100.0)
Monocytes Absolute: 0.7 10*3/uL (ref 0.1–1.0)
Monocytes Relative: 13 %
Neutro Abs: 3.7 10*3/uL (ref 1.7–7.7)
Neutrophils Relative %: 65 %
Platelets: 141 10*3/uL — ABNORMAL LOW (ref 150–400)
RBC: 3.44 MIL/uL — ABNORMAL LOW (ref 3.87–5.11)
RDW: 13.8 % (ref 11.5–15.5)
WBC: 5.6 10*3/uL (ref 4.0–10.5)
nRBC: 0 % (ref 0.0–0.2)

## 2023-11-20 LAB — COMPREHENSIVE METABOLIC PANEL WITH GFR
ALT: 47 U/L — ABNORMAL HIGH (ref 0–44)
AST: 47 U/L — ABNORMAL HIGH (ref 15–41)
Albumin: 3.5 g/dL (ref 3.5–5.0)
Alkaline Phosphatase: 54 U/L (ref 38–126)
Anion gap: 11 (ref 5–15)
BUN: 28 mg/dL — ABNORMAL HIGH (ref 8–23)
CO2: 24 mmol/L (ref 22–32)
Calcium: 8.5 mg/dL — ABNORMAL LOW (ref 8.9–10.3)
Chloride: 102 mmol/L (ref 98–111)
Creatinine, Ser: 0.93 mg/dL (ref 0.44–1.00)
GFR, Estimated: >60 mL/min (ref >60)
Glucose, Bld: 85 mg/dL (ref 70–99)
Potassium: 3.2 mmol/L — ABNORMAL LOW (ref 3.5–5.1)
Sodium: 137 mmol/L (ref 135–145)
Total Bilirubin: 0.5 mg/dL (ref 0.0–1.2)
Total Protein: 7.2 g/dL (ref 6.5–8.1)

## 2023-11-20 LAB — MAGNESIUM: Magnesium: 2.2 mg/dL (ref 1.7–2.4)

## 2023-11-20 LAB — IRON AND TIBC
Iron: 57 ug/dL (ref 28–170)
Saturation Ratios: 17 % (ref 10.4–31.8)
TIBC: 338 ug/dL (ref 250–450)
UIBC: 281 ug/dL

## 2023-11-20 LAB — VITAMIN B12: Vitamin B-12: 1391 pg/mL — ABNORMAL HIGH (ref 180–914)

## 2023-11-20 LAB — FERRITIN: Ferritin: 140 ng/mL (ref 11–307)

## 2023-11-20 MED ORDER — POTASSIUM CHLORIDE CRYS ER 20 MEQ PO TBCR
20.0000 meq | EXTENDED_RELEASE_TABLET | Freq: Once | ORAL | Status: AC
  Administered 2023-11-20: 20 meq via ORAL
  Filled 2023-11-20: qty 1

## 2023-11-20 MED ORDER — POTASSIUM CHLORIDE IN NACL 20-0.9 MEQ/L-% IV SOLN
Freq: Once | INTRAVENOUS | Status: AC
  Filled 2023-11-20: qty 1000

## 2023-11-20 MED ORDER — MAGNESIUM SULFATE 2 GM/50ML IV SOLN
2.0000 g | Freq: Once | INTRAVENOUS | Status: AC
  Administered 2023-11-20: 2 g via INTRAVENOUS
  Filled 2023-11-20: qty 50

## 2023-11-20 MED ORDER — PROCHLORPERAZINE MALEATE 10 MG PO TABS
10.0000 mg | ORAL_TABLET | Freq: Four times a day (QID) | ORAL | Status: DC | PRN
  Administered 2023-11-20: 10 mg via ORAL
  Filled 2023-11-20: qty 1

## 2023-11-20 MED ORDER — BORTEZOMIB CHEMO SQ INJECTION 3.5 MG (2.5MG/ML)
0.7000 mg/m2 | Freq: Once | INTRAMUSCULAR | Status: AC
  Administered 2023-11-20: 1.25 mg via SUBCUTANEOUS
  Filled 2023-11-20: qty 0.5

## 2023-11-20 NOTE — Progress Notes (Signed)
 Blood pressure consistently running low, Per MD will hold Losartan , [place]. RN to call pharmacy to let them know   Velcade  injection and hydration fluids with magnesium  given per orders. Patient tolerated it well without problems. Vitals stable and discharged home from clinic via wheelchair. Follow up as scheduled.   Patient picked up today from [facility] today.

## 2023-11-20 NOTE — Progress Notes (Signed)
 Potassium chloride  clarification:  20 mEq in NS 1000 ml (house fluids) over 2 hours + 20 mEq orally x 1   Total dose = 40 mEq  Potassium level 3.2  V.O. Dr Theadore Molt, PharmD

## 2023-11-20 NOTE — Patient Instructions (Signed)
 [facility] [place] - A DEPT OF . [facility]  Discharge Instructions: Thank you for choosing [facility] to provide your oncology and hematology care.  If you have a lab appointment with the Cancer Center - please note that after April 8th, 2024, all labs will be drawn in the cancer center.  You do not have to check in or register with the main entrance as you have in the past but will complete your check-in in the cancer center.  Wear comfortable clothing and clothing appropriate for easy access to any Portacath or PICC line.   We strive to give you quality time with your provider. You may need to reschedule your appointment if you arrive late (15 or more minutes).  Arriving late affects you and other patients whose appointments are after yours.  Also, if you miss three or more appointments without notifying the office, you may be dismissed from the clinic at the provider's discretion.      For prescription refill requests, have your pharmacy contact our office and allow 72 hours for refills to be completed.    Today you received the following chemotherapy and/or immunotherapy agents velcade  and hydration fluids   To help prevent nausea and vomiting after your treatment, we encourage you to take your nausea medication as directed.  BELOW ARE SYMPTOMS THAT SHOULD BE REPORTED IMMEDIATELY: *FEVER GREATER THAN 100.4 F (38 C) OR HIGHER *CHILLS OR SWEATING *NAUSEA AND VOMITING THAT IS NOT CONTROLLED WITH YOUR NAUSEA MEDICATION *UNUSUAL SHORTNESS OF BREATH *UNUSUAL BRUISING OR BLEEDING *URINARY PROBLEMS (pain or burning when urinating, or frequent urination) *BOWEL PROBLEMS (unusual diarrhea, constipation, pain near the anus) TENDERNESS IN MOUTH AND THROAT WITH OR WITHOUT PRESENCE OF ULCERS (sore throat, sores in mouth, or a toothache) UNUSUAL RASH, SWELLING OR PAIN  UNUSUAL VAGINAL DISCHARGE OR ITCHING   Items with * indicate a potential emergency and  should be followed up as soon as possible or go to the Emergency Department if any problems should occur.  Please show the CHEMOTHERAPY ALERT CARD or IMMUNOTHERAPY ALERT CARD at check-in to the Emergency Department and triage nurse.  Should you have questions after your visit or need to cancel or reschedule your appointment, please contact [facility] [place] - A DEPT OF [facility] [phone]  and follow the prompts.  Office hours are 8:00 a.m. to 4:30 p.m. Monday - Friday. Please note that voicemails left after 4:00 p.m. may not be returned until the following business day.  We are closed weekends and major holidays. You have access to a nurse at all times for urgent questions. Please call the main number to the clinic [phone] and follow the prompts.  For any non-urgent questions, you may also contact your provider using MyChart. We now offer e-Visits for anyone 79 and older to request care online for non-urgent symptoms. For details visit [email or web address].   Also download the MyChart app! Go to the app store, search MyChart, open the app, select [facility], and log in with your MyChart username and password.

## 2023-11-20 NOTE — Progress Notes (Signed)
 [facility] [address], [place] [address]    Clinic Day:  11/20/2023  Referring physician: Zarwolo, [name], FNP  Patient Care Team: Zarwolo, [name], FNP as PCP - General (Family Medicine) Rogers Hai, MD as Medical Oncologist (Oncology) Wilson, Diane G, RN as Oncology Nurse Navigator (Oncology) Cindie Carlin POUR, DO as Consulting Physician (Internal Medicine) Frances, Ozell RAMAN, LCSW as [facility] Care Management (Licensed Clinical Social Worker) Clement Odor, LCSW as Child psychotherapist (Licensed Visual merchandiser)   ASSESSMENT & PLAN:   Assessment: 1.  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  Increased number of atypical plasma cells present 36% of all cell lines.  Plasma cells are kappa light chain restricted. -Unfortunately her specimen has reached cytogenetics and FISH lab week later due to bad weather and no viable plasma cells. -PET scan on 07/09/2019 showed no findings of active myeloma. -24-hour urine shows total protein 59 mg.  Urine immunofixation shows IgA kappa monoclonal protein.  LDH is 184. -Based on [facility] criteria she has high risk with about 45-50% probability of progression to myeloma in the next 2 years. -CTAP on 01/07/2020 shows acute superior endplate burst fracture of L1.  Mild associated paravertebral soft tissue thickening.  No other bone abnormalities. -Bone density test on 02/07/2020 shows T score -0.5. - Bone marrow biopsy on 07/12/2021: Hypercellular with increased number of atypical plasma cells representing 30% of cells, displaying kappa light chain restriction. - FISH panel: Gain of 1 q. (high risk), T p53 deletion (high risk), monosomy 13 (standard risk) - Cytogenetics: Complex karyotype. - PET scan (06/24/2021): New hypermetabolic bone lesions involving left anterior iliac crest, left mid humerus, right superior  pubic ramus, bilateral mid femurs, left calvarium.  Largest lytic lesion in the left iliac crest measuring 2.9 x 2.3 cm. - 2D echo on 07/29/2021: EF 50-55%.  There is distal septal and inferior apical hypokinesis.  (Carfilzomib based regimen was put on hold due to echo findings) - Dara VRD 6 cycles from 08/10/2021 through 11/30/2021 - Myeloma panel 12/07/2021, DIRA test was negative, M spike 0.2 g and light chain ratio normal. - Maintenance Velcade  every 2 weeks and Revlimid  3 weeks on 1 week off Started on 12/20/2021    2.  Social/family history: - She uses walker to ambulate since her right knee replacement leading to stiffness.  She lives with older sister and husband who has dementia. - She is independent of ADLs and IADLs.  She also drives     Plan: 1.  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 - She is receiving Velcade  0.7 mg/m every 2 weeks. - I reviewed the myeloma panel from 11/01/2023: M spike is 0.4 g, up from 0.3 g previously.  FLC ratio is normal at 0.8.  Kappa light chains of 30.7. - Reviewed labs today: CBC was grossly normal with mild thrombocytopenia and mild anemia.  AST and ALT are elevated at 47.  The rest of the LFTs are normal.  Creatinine is normal. - Blood pressure today is 80/68, improved to 88/56 upon recheck.  I have recommended that she stop losartan  100 mg daily.  She will continue metoprolol  at this time.  Previously thiazide diuretic was discontinued along with amlodipine . - She will receive  IV fluids today.  She will continue Velcade  every 2 weeks.  She has lost about 4 pounds in the last 4 weeks. - Upon further questioning, she has not been taking Revlimid  in the last 4 weeks.  She reports that her daughter is trying to get the new shipment.  She will start it when she receives it.  I would not make any changes to the treatment  plan at this time.  I will reevaluate her in 4 weeks with repeat myeloma panel in 2 weeks.    2.  Peripheral neuropathy: - At last visit we discontinued gabapentin .  We have started her on Lyrica  50 mg twice daily which is helping with her neuropathic pains.   3.  Pulmonary embolism (2015): - Continue Coumadin indefinitely.  No bleeding issues reported.  4.  ID prophylaxis: - Continue acyclovir  twice daily.  5.  Myeloma bone disease: - Continue denosumab  monthly.  Calcium is 8.5 with albumin 3.5.  6.  Right knee pain: - Continue tramadol  daily as needed.    No orders of the defined types were placed in this encounter.    LILLETTE Verneta SAUNDERS Teague,acting as a Neurosurgeon for Alean Stands, MD.,have documented all relevant documentation on the behalf of Alean Stands, MD,as directed by  Alean Stands, MD while in the presence of Alean Stands, MD.  I, Alean Stands MD, have reviewed the above documentation for accuracy and completeness, and I agree with the above.      Alean Stands, MD   6/30/20255:09 PM  CHIEF COMPLAINT:   Diagnosis: multiple myeloma    Cancer Staging  No matching staging information was found for the patient.    Prior Therapy: Dara VRD 6 cycles completed on 11/30/2021   Current Therapy:  Maintenance Velcade  every 2 weeks, and Revlimid  3 weeks on/1 week off    HISTORY OF PRESENT ILLNESS:   Oncology History  IgA myeloma (HCC)  05/06/2021 Initial Diagnosis   IgA myeloma (HCC)   08/10/2021 - 01/19/2022 Chemotherapy   Patient is on Treatment Plan : MYELOMA NEWLY DIAGNOSED TRANSPLANT CANDIDATE DaraVRd (Daratumumab  SQ) q21d x 6 Cycles (Induction/Consolidation)     08/10/2021 -  Chemotherapy   Patient is on Treatment Plan : MYELOMA NEWLY DIAGNOSED TRANSPLANT CANDIDATE Velcade  D1 and D 15 q 28 days        INTERVAL HISTORY:   Palak is a 73 y.o. female presenting to clinic today for follow up of multiple myeloma. She was  last seen by me on 10/19/23.  Since her last visit, she had a colonoscopy on 11/03/23 with Dr. Cindie.   Today, she states that she is doing well overall. Her appetite level is at 75%. Her energy level is at 10%.   PAST MEDICAL HISTORY:   Past Medical History: Past Medical History:  Diagnosis Date   Acute myocardial infarction [facility]) 2009   CAD/no stent medically managed   Allergy    Anxiety disorder    CAD (coronary artery disease)    Cancer (HCC)    multiple myeloma   CHF (congestive heart failure) (HCC) 959879   Glaucoma [id number]   Hyperlipidemia    Hypertension    IBS (irritable bowel syndrome)    Non-ulcer dyspepsia 04/30/2009   Qualifier: Diagnosis of  By: Harvey MD, Sandi L    Overactive bladder    PE (pulmonary embolism) 10/2012   left lung   Presence of permanent cardiac pacemaker    Sleep apnea     Surgical History: Past Surgical History:  Procedure Laterality Date   ABDOMINAL HYSTERECTOMY     BSO secondary to cyst     CARDIAC CATHETERIZATION     CATARACT EXTRACTION W/PHACO Left 11/16/2023   Procedure: PHACOEMULSIFICATION, CATARACT, WITH IOL INSERTION 5.45, 00:43.2;  Surgeon: Enola Feliciano Hugger, MD;  Location: [facility];  Service: Ophthalmology;  Laterality: Left;   COLONOSCOPY  12/2009   Dr. Rhoda, propofol , normal. Next TCS 12/2019   COLONOSCOPY N/A 05/22/2017   Examined portion ileum was normal, significant looping of colon, external hemorrhoids and rectal bleeding due to internal hemorrhoids, mild diverticulosis procedure: COLONOSCOPY;  Surgeon: Harvey Margo CROME, MD;  [facility];  Service: Endoscopy;  Laterality: N/A;  10:30am   COLONOSCOPY N/A 11/03/2023   Procedure: COLONOSCOPY;  Surgeon: Cindie Carlin POUR, DO;  Location: [facility];  Service: Endoscopy;  Laterality: N/A;  1:00 pm, asa 3   ESOPHAGOGASTRODUODENOSCOPY  05/11/2009   schatzki ring/small hiatal hernia/path:gastritis   ESOPHAGOGASTRODUODENOSCOPY N/A 05/22/2017    Multiple gastric polyps with biopsy benign fundic gland polyp, small bowel biopsy negative for celiac, gastric biopsy with mild gastritis but no H. pylori procedure: ESOPHAGOGASTRODUODENOSCOPY (EGD);  Surgeon: Harvey Margo CROME, MD;  [facility];  Service: Endoscopy;  Laterality: N/A;   ESOPHAGOGASTRODUODENOSCOPY N/A 11/03/2023   Procedure: EGD (ESOPHAGOGASTRODUODENOSCOPY);  Surgeon: Cindie Carlin POUR, DO;  [facility];  Service: Endoscopy;  Laterality: N/A;   ESOPHAGOGASTRODUODENOSCOPY (EGD) WITH ESOPHAGEAL DILATION N/A 04/01/2013   DOQ:dumprulmz at the gastroesophageal juction/multiple small polyps/mild gastritis   GIVENS CAPSULE STUDY N/A 06/07/2017   Frequent gastric erosions, normal small bowel mucosa.  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    Social History: Social History   Socioeconomic History   Marital status: Married    Spouse name: Doctor, general practice   Number of children: 3   Years of education: 16   Highest education level: Master's degree (e.g., MA, MS, MEng, MEd, MSW, MBA)  Occupational History   Not on file  Tobacco Use   Smoking status: Never    Passive exposure: Never   Smokeless tobacco: Never   Tobacco comments:    Never smoked   Vaping Use   Vaping status: Never Used  Substance and Sexual Activity   Alcohol use: Not Currently    Comment: occ wine   Drug use: No   Sexual activity: Not Currently  Other Topics Concern   Not on file  Social History Narrative      Lives with husband-51 years 950 in Aug 2021   Daughter is close by, 2 sons live further away   One IN [place],ONE IN [place], ONE BESIDE HER.        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   Enjoys: reading, young adult      Diet:  eats all food groups    Caffeine: coffee 1, tea daily soda-1 daily   Water : 1-2 cups      Wears seat belt    Does not use phone while driving   Smoke detectors at home    Licensed conveyancer -locked up      Left handed   One story home   Drinks caffeine   Social Drivers of Health  Financial Resource Strain: Low Risk  (06/26/2023)   Overall Financial Resource Strain (CARDIA)    Difficulty of Paying Living Expenses: Not hard at all  Food Insecurity: No Food Insecurity (10/18/2023)   Hunger Vital Sign    Worried About Running Out of Food in the Last Year: Never true    Ran Out of Food in the Last Year: Never true  Transportation Needs: No Transportation Needs (10/18/2023)   PRAPARE - Administrator, Civil Service (Medical): No    Lack of Transportation (Non-Medical): No  Physical Activity: Insufficiently Active (06/26/2023)   Exercise Vital Sign    Days of Exercise per Week: 2 days    Minutes of Exercise per Session: 20 min  Stress: Stress Concern Present (06/26/2023)   [employer] of Occupational Health - Occupational Stress Questionnaire    Feeling of Stress : To some extent  Social Connections: Socially Integrated (06/26/2023)   Social Connection and Isolation Panel    Frequency of Communication with Friends and Family: Twice a week    Frequency of Social Gatherings with Friends and Family: Once a week    Attends Religious Services: 1 to 4 times per year    Active Member of [employer] or Organizations: Yes    Attends Banker Meetings: 1 to 4 times per year    Marital Status: Married  Catering manager Violence: Not At Risk (10/18/2023)   Humiliation, Afraid, Rape, and Kick questionnaire    Fear of Current or Ex-Partner: No    Emotionally Abused: No    Physically Abused: No    Sexually Abused: No    Family History: Family History  Problem Relation Age of Onset   Anxiety disorder Mother    Cancer Mother    Heart disease Father     Hypertension Father    Colon polyps Neg Hx    Colon cancer Neg Hx     Current Medications:  Current Outpatient Medications:    acetaminophen  (TYLENOL ) 500 MG tablet, Take 1,000 mg by mouth 2 (two) times daily as needed for moderate pain or headache., Disp: , Rfl:    acyclovir  (ZOVIRAX ) 400 MG tablet, TAKE ONE TABLET BY MOUTH TWICE DAILY, Disp: 60 tablet, Rfl: 6   ASPIRIN LOW DOSE 81 MG tablet, Take 81 mg by mouth daily., Disp: , Rfl:    B Complex-Folic Acid  (B COMPLEX VITAMINS, W/ FA,) CAPS, Take 1 capsule by mouth daily., Disp: , Rfl:    busPIRone  (BUSPAR ) 5 MG tablet, Take 5 mg by mouth daily., Disp: , Rfl:    Calcium Carb-Cholecalciferol 600-10 MG-MCG TABS, Take 1 tablet by mouth 2 (two) times daily., Disp: , Rfl:    Calcium Carbonate-Vitamin D  (CALCIUM 600+D PO), Take 1 tablet by mouth 2 (two) times daily., Disp: , Rfl:    chlorthalidone (HYGROTON) 25 MG tablet, Take 25 mg by mouth daily. Stop taking today per Dr.Zuleima Haser, Disp: , Rfl:    cholecalciferol (VITAMIN D3) 10 MCG (400 UNIT) TABS tablet, Take 400 Units by mouth daily., Disp: , Rfl:    citalopram  (CELEXA ) 20 MG tablet, Take 1 tablet (20 mg total) by mouth daily., Disp: 30 tablet, Rfl: 3   cyanocobalamin (VITAMIN B12) 1000 MCG tablet, Take 1,000 mcg by mouth daily., Disp: , Rfl:    diclofenac  Sodium (VOLTAREN ) 1 % GEL, Apply 4 g topically 4 (four) times daily., Disp: 50 g, Rfl: 0   dicyclomine  (BENTYL ) 10 MG capsule, Take 1 capsule (10 mg total) by mouth 3 (three)  times daily before meals., Disp: 90 capsule, Rfl: 2   FEROSUL 325 (65 Fe) MG tablet, Take 325 mg by mouth daily., Disp: , Rfl:    fluticasone  (FLONASE ) 50 MCG/ACT nasal spray, USE 2 SPRAYS IN EACH NOSTRIL ONCE DAILY, Disp: 16 g, Rfl: 0   GNP VITAMIN C 500 MG tablet, Take 500 mg by mouth daily., Disp: , Rfl:    lenalidomide  (REVLIMID ) 10 MG capsule, Take 1 capsule (10 mg total) by mouth daily. 21 days on, 7 days off every 28 days, Disp: 21 capsule, Rfl: 0    levocetirizine (XYZAL ) 5 MG tablet, TAKE ONE TABLET BY MOUTH IN THE EVENING, Disp: 30 tablet, Rfl: 3   lidocaine  (LIDODERM ) 5 %, Place 1 patch onto the skin daily. Remove & Discard patch within 12 hours or as directed by MD, Disp: 30 patch, Rfl: 0   magnesium  oxide (MAG-OX) 400 (240 Mg) MG tablet, TAKE ONE TABLET BY MOUTH TWICE DAILY, Disp: 60 tablet, Rfl: 4   metoprolol  tartrate (LOPRESSOR ) 50 MG tablet, Take 1 tablet (50 mg total) by mouth 2 (two) times daily., Disp: 180 tablet, Rfl: 1   montelukast  (SINGULAIR ) 10 MG tablet, TAKE ONE TABLET BY MOUTH AT BEDTIME, Disp: 30 tablet, Rfl: 3   MYRBETRIQ  50 MG TB24 tablet, TAKE ONE TABLET BY MOUTH ONCE DAILY, Disp: 30 tablet, Rfl: 3   Na Sulfate-K Sulfate-Mg Sulfate concentrate (SUPREP) 17.5-3.13-1.6 GM/177ML SOLN, As directed, Disp: 354 mL, Rfl: 0   nitroGLYCERIN (NITROSTAT) 0.4 MG SL tablet, Place 0.4 mg under the tongue every 5 (five) minutes as needed for chest pain., Disp: , Rfl:    pantoprazole  (PROTONIX ) 40 MG tablet, TAKE ONE TABLET BY MOUTH TWICE DAILY, Disp: 90 tablet, Rfl: 3   potassium chloride  SA (KLOR-CON  M) 20 MEQ tablet, Take 1 tablet (20 mEq total) by mouth 2 (two) times daily., Disp: 60 tablet, Rfl: 3   pregabalin  (LYRICA ) 50 MG capsule, TAKE ONE CAPSULE BY MOUTH TWICE DAILY, Disp: 60 capsule, Rfl: 1   PREMARIN vaginal cream, Place vaginally., Disp: , Rfl:    primidone (MYSOLINE) 50 MG tablet, TAKE ONE TABLET BY MOUTH TWICE DAILY MORNING AND BEDTIME, Disp: 60 tablet, Rfl: 5   Probiotic Product (GNP PROBIOTIC COLON SUPPORT) CAPS, Take 1 capsule by mouth daily., Disp: , Rfl:    simvastatin  (ZOCOR ) 20 MG tablet, Take 20 mg by mouth at bedtime., Disp: , Rfl:    topiramate (TOPAMAX) 25 MG tablet, Take 25 mg by mouth 2 (two) times daily., Disp: , Rfl:    traMADol  (ULTRAM ) 50 MG tablet, Take 1 tablet (50 mg total) by mouth every 6 (six) hours as needed., Disp: 20 tablet, Rfl: 0   warfarin (COUMADIN) 6 MG tablet, Take 6 mg by mouth at bedtime.,  Disp: , Rfl:    sertraline  (ZOLOFT ) 50 MG tablet, Take 50 mg by mouth daily., Disp: , Rfl:  No current facility-administered medications for this visit.  Facility-Administered Medications Ordered in Other Visits:    prochlorperazine  (COMPAZINE ) tablet 10 mg, 10 mg, Oral, Q6H PRN, Errika Narvaiz, MD, 10 mg at 11/20/23 1227   Allergies: Allergies  Allergen Reactions   Amoxicillin-Pot Clavulanate Other (See Comments)   Clarithromycin Other (See Comments)    Stomach problems   Erythromycin    Lisinopril Swelling   Amoxicillin Diarrhea    REVIEW OF SYSTEMS:   Review of Systems  Constitutional:  Negative for chills, fatigue and fever.  HENT:   Negative for lump/mass, mouth sores, nosebleeds, sore throat and trouble  swallowing.   Eyes:  Negative for eye problems.  Respiratory:  Negative for cough and shortness of breath.   Cardiovascular:  Negative for chest pain, leg swelling and palpitations.  Gastrointestinal:  Positive for constipation. Negative for abdominal pain, diarrhea, nausea and vomiting.  Genitourinary:  Negative for bladder incontinence, difficulty urinating, dysuria, frequency, hematuria and nocturia.   Musculoskeletal:  Negative for arthralgias, back pain, flank pain, myalgias and neck pain.  Skin:  Negative for itching and rash.  Neurological:  Positive for numbness. Negative for dizziness and headaches.  Hematological:  Does not bruise/bleed easily.  Psychiatric/Behavioral:  Negative for depression, sleep disturbance and suicidal ideas. The patient is not nervous/anxious.   All other systems reviewed and are negative.    VITALS:   Blood pressure (!) 88/56, pulse 64, temperature (!) 96.8 F (36 C), temperature source Tympanic, resp. rate 20, weight 130 lb 9.6 oz (59.2 kg), SpO2 100%.  Wt Readings from Last 3 Encounters:  11/20/23 130 lb 9.6 oz (59.2 kg)  11/16/23 131 lb (59.4 kg)  11/03/23 131 lb (59.4 kg)    Body mass index is 23.14 kg/m.  Performance  status (ECOG): 1 - Symptomatic but completely ambulatory  PHYSICAL EXAM:   Physical Exam Vitals and nursing note reviewed. Exam conducted with a chaperone present.  Constitutional:      Appearance: Normal appearance.   Cardiovascular:     Rate and Rhythm: Normal rate and regular rhythm.     Pulses: Normal pulses.     Heart sounds: Normal heart sounds.  Pulmonary:     Effort: Pulmonary effort is normal.     Breath sounds: Normal breath sounds.  Abdominal:     Palpations: Abdomen is soft. There is no hepatomegaly, splenomegaly or mass.     Tenderness: There is no abdominal tenderness.   Musculoskeletal:     Right lower leg: No edema.     Left lower leg: No edema.  Lymphadenopathy:     Cervical: No cervical adenopathy.     Right cervical: No superficial, deep or posterior cervical adenopathy.    Left cervical: No superficial, deep or posterior cervical adenopathy.     Upper Body:     Right upper body: No supraclavicular or axillary adenopathy.     Left upper body: No supraclavicular or axillary adenopathy.   Neurological:     General: No focal deficit present.     Mental Status: She is alert and oriented to person, place, and time.   Psychiatric:        Mood and Affect: Mood normal.        Behavior: Behavior normal.     LABS:      Latest Ref Rng & Units 11/20/2023   10:42 AM 10/19/2023   10:05 AM 10/05/2023   11:46 AM  CBC  WBC 4.0 - 10.5 K/uL 5.6  3.8  4.1   Hemoglobin 12.0 - 15.0 g/dL 88.1  88.3  88.0   Hematocrit 36.0 - 46.0 % 36.3  33.5  33.5   Platelets 150 - 400 K/uL 141  152  140       Latest Ref Rng & Units 11/20/2023   10:42 AM 11/01/2023   11:39 AM 10/19/2023   10:05 AM  CMP  Glucose 70 - 99 mg/dL 85  87  895   BUN 8 - 23 mg/dL 28  23  16    Creatinine 0.44 - 1.00 mg/dL 9.06  9.06  9.25   Sodium 135 - 145 mmol/L 137  138  129   Potassium 3.5 - 5.1 mmol/L 3.2  3.5  3.2   Chloride 98 - 111 mmol/L 102  106  94   CO2 22 - 32 mmol/L 24  22  25    Calcium  8.9 - 10.3 mg/dL 8.5  8.5  8.6   Total Protein 6.5 - 8.1 g/dL 7.2  6.7  6.7   Total Bilirubin 0.0 - 1.2 mg/dL 0.5  0.6  0.5   Alkaline Phos 38 - 126 U/L 54  46  45   AST 15 - 41 U/L 47  32  34   ALT 0 - 44 U/L 47  39  32      No results found for: CEA1, CEA / No results found for: CEA1, CEA No results found for: PSA1 No results found for: CAN199 No results found for: RJW874  Lab Results  Component Value Date   TOTALPROTELP 6.3 11/01/2023   ALBUMINELP 3.3 11/01/2023   A1GS 0.2 11/01/2023   A2GS 0.7 11/01/2023   BETS 1.0 11/01/2023   GAMS 1.0 11/01/2023   MSPIKE 0.4 (H) 11/01/2023   SPEI Comment 11/01/2023   Lab Results  Component Value Date   TIBC 331 11/01/2023   TIBC 291 06/09/2021   TIBC 297 01/21/2021   FERRITIN 103 11/01/2023   FERRITIN 279 06/09/2021   FERRITIN 216 01/21/2021   IRONPCTSAT 20 11/01/2023   IRONPCTSAT 24 06/09/2021   IRONPCTSAT 19 01/21/2021   Lab Results  Component Value Date   LDH 129 03/02/2022   LDH 121 12/14/2021   LDH 113 11/09/2021     STUDIES:   No results found.

## 2023-11-20 NOTE — Progress Notes (Signed)
 Patient presents today for Velcade  injection. BP low on arrival 80/64. Message received from A.Lenon Peak / Dr. Katragadda to give (house fluids).   Message received from A.Lenon RN / Dr. Katragadda to proceed with treatment. Potassium 3.2. KDUR 20 mEq given PO x 1 dose per CSABRA Molt RPH to equal 40 mEq of potassium.

## 2023-11-20 NOTE — Patient Instructions (Addendum)
 [facility] at [facility] Discharge Instructions   You were seen and examined today by Dr. Rogers.  He reviewed the results of your lab work which are normal/stable.   We will proceed with your treatment today.   Stop taking the losartan  (blood pressure medication) as your blood pressure is low today (88/56).   Return as scheduled.    Thank you for choosing [facility] at [facility] to provide your oncology and hematology care.  To afford each patient quality time with our provider, please arrive at least 15 minutes before your scheduled appointment time.   If you have a lab appointment with the Cancer Center please come in thru the Main Entrance and check in at the main information desk.  You need to re-schedule your appointment should you arrive 10 or more minutes late.  We strive to give you quality time with our providers, and arriving late affects you and other patients whose appointments are after yours.  Also, if you no show three or more times for appointments you may be dismissed from the clinic at the providers discretion.     Again, thank you for choosing [facility].  Our hope is that these requests will decrease the amount of time that you wait before being seen by our physicians.       _____________________________________________________________  Should you have questions after your visit to [facility], please contact our office at [phone] and follow the prompts.  Our office hours are 8:00 a.m. and 4:30 p.m. Monday - Friday.  Please note that voicemails left after 4:00 p.m. may not be returned until the following business day.  We are closed weekends and major holidays.  You do have access to a nurse 24-7, just call the main number to the clinic [phone] and do not press any options, hold on the line and a nurse will answer the phone.    For prescription refill requests, have your pharmacy  contact our office and allow 72 hours.    Due to Covid, you will need to wear a mask upon entering the hospital. If you do not have a mask, a mask will be given to you at the Main Entrance upon arrival. For doctor visits, patients may have 1 support person age 61 or older with them. For treatment visits, patients can not have anyone with them due to social distancing guidelines and our immunocompromised population.

## 2023-11-21 ENCOUNTER — Encounter: Payer: Self-pay | Admitting: Family Medicine

## 2023-11-21 ENCOUNTER — Encounter: Admitting: Obstetrics & Gynecology

## 2023-11-21 LAB — KAPPA/LAMBDA LIGHT CHAINS
Kappa free light chain: 29.9 mg/L — ABNORMAL HIGH (ref 3.3–19.4)
Kappa, lambda light chain ratio: 0.82 (ref 0.26–1.65)
Lambda free light chains: 36.3 mg/L — ABNORMAL HIGH (ref 5.7–26.3)

## 2023-11-22 ENCOUNTER — Inpatient Hospital Stay

## 2023-11-22 ENCOUNTER — Inpatient Hospital Stay: Admitting: Hematology

## 2023-11-22 LAB — IMMUNOFIXATION ELECTROPHORESIS
IgA: 952 mg/dL — ABNORMAL HIGH (ref 64–422)
IgG (Immunoglobin G), Serum: 946 mg/dL (ref 586–1602)
IgM (Immunoglobulin M), Srm: 46 mg/dL (ref 26–217)
Total Protein ELP: 6.8 g/dL (ref 6.0–8.5)

## 2023-11-24 NOTE — Telephone Encounter (Signed)
 Kindly place referral to cardiology

## 2023-11-27 ENCOUNTER — Other Ambulatory Visit: Payer: Self-pay

## 2023-11-27 DIAGNOSIS — I2510 Atherosclerotic heart disease of native coronary artery without angina pectoris: Secondary | ICD-10-CM

## 2023-11-27 LAB — PROTEIN ELECTROPHORESIS, SERUM
A/G Ratio: 1 (ref 0.7–1.7)
Albumin ELP: 3.3 g/dL (ref 2.9–4.4)
Alpha-1-Globulin: 0.3 g/dL (ref 0.0–0.4)
Alpha-2-Globulin: 0.8 g/dL (ref 0.4–1.0)
Beta Globulin: 1.1 g/dL (ref 0.7–1.3)
Gamma Globulin: 1.2 g/dL (ref 0.4–1.8)
Globulin, Total: 3.4 g/dL (ref 2.2–3.9)
M-Spike, %: 0.5 g/dL — ABNORMAL HIGH
Total Protein ELP: 6.7 g/dL (ref 6.0–8.5)

## 2023-11-27 NOTE — Telephone Encounter (Signed)
 Referral placed.

## 2023-11-28 ENCOUNTER — Encounter: Payer: Self-pay | Admitting: Hematology

## 2023-11-29 ENCOUNTER — Inpatient Hospital Stay

## 2023-11-29 ENCOUNTER — Ambulatory Visit: Admitting: Urology

## 2023-11-29 ENCOUNTER — Encounter: Payer: Self-pay | Admitting: Urology

## 2023-11-29 VITALS — BP 76/51 | HR 85

## 2023-11-29 DIAGNOSIS — N3281 Overactive bladder: Secondary | ICD-10-CM | POA: Diagnosis not present

## 2023-11-29 DIAGNOSIS — N8111 Cystocele, midline: Secondary | ICD-10-CM

## 2023-11-29 DIAGNOSIS — N8110 Cystocele, unspecified: Secondary | ICD-10-CM | POA: Diagnosis not present

## 2023-11-29 DIAGNOSIS — R413 Other amnesia: Secondary | ICD-10-CM

## 2023-11-29 DIAGNOSIS — R339 Retention of urine, unspecified: Secondary | ICD-10-CM

## 2023-11-29 DIAGNOSIS — Z8719 Personal history of other diseases of the digestive system: Secondary | ICD-10-CM

## 2023-11-29 LAB — BLADDER SCAN AMB NON-IMAGING: Scan Result: 0

## 2023-11-29 NOTE — Progress Notes (Signed)
 Name: Courtney Rogers DOB: 01-27-1951 MRN: [id number]  History of Present Illness: Courtney Rogers is a 73 y.o. female who presents today for follow up visit at [facility] [place]. She is accompanied by her daughter Courtney Rogers, who assists with providing history. Relevant History includes: 1. OAB with urge incontinence. 2. Stress urinary incontinence. 3. Kidney stones. - One 5 mm right kidney stone found incidentally on CT 08/12/2023. - She denies prior history of kidney stones or prior stone procedure(s). 4. Right renal cyst. 5. Cystocele. 6. Vaginal atrophy.  At 1st visit on 10/12/2023: 1. Voiding dysfunction with incomplete bladder emptying: Likely secondary to constipation at this time since she has been on Myrbetriq  for >2 years without incident. PVR = 324 ml.  2. OAB with frequency, urgency, and urge incontinence.   3. Stress urinary incontinence. Infrequent, not significantly bothersome per patient. 4. Vaginal atrophy. 5. Pelvic organ prolapse. 6. Right kidney stone and benign-appearing right renal cyst.  - The plan was:  Constipation management as discussed. Restart topical vaginal estrogen cream use nightly x1 week then every other night long term. Return in about 1 week (around 10/19/2023) for nurse visit for PVR check. F/u with NP in 6 months with RUS prior for stone & renal cyst surveillance. GYN referral placed. Follow up with GI specialist.  Since last visit: > 10/30/2023: Urology nurse visit.  - PVR >500 ml. - Foley catheter placed.  > 11/09/2023: Urology nurse visit.  - Failed voiding trial. Foley catheter replaced.  Today: She reports the catheter is draining well.  She denies gross hematuria.  She denies flank pain or abdominal pain. She denies constipation.  She is scheduled to see GYN next Wednesday 12/06/2023 for her pelvic organ prolapse.  Medications: Current Outpatient Medications  Medication Sig Dispense Refill   acetaminophen  (TYLENOL ) 500  MG tablet Take 1,000 mg by mouth 2 (two) times daily as needed for moderate pain or headache.     acyclovir  (ZOVIRAX ) 400 MG tablet TAKE ONE TABLET BY MOUTH TWICE DAILY 60 tablet 6   ASPIRIN LOW DOSE 81 MG tablet Take 81 mg by mouth daily.     B Complex-Folic Acid  (B COMPLEX VITAMINS, W/ FA,) CAPS Take 1 capsule by mouth daily.     cholecalciferol (VITAMIN D3) 10 MCG (400 UNIT) TABS tablet Take 400 Units by mouth daily.     citalopram  (CELEXA ) 20 MG tablet Take 1 tablet (20 mg total) by mouth daily. 30 tablet 3   cyanocobalamin  (VITAMIN B12) 1000 MCG tablet Take 1,000 mcg by mouth daily.     diclofenac  Sodium (VOLTAREN ) 1 % GEL Apply 4 g topically 4 (four) times daily. 50 g 0   dicyclomine  (BENTYL ) 10 MG capsule Take 1 capsule (10 mg total) by mouth 3 (three) times daily before meals. 90 capsule 2   FEROSUL 325 (65 Fe) MG tablet Take 325 mg by mouth daily.     fluticasone  (FLONASE ) 50 MCG/ACT nasal spray USE 2 SPRAYS IN EACH NOSTRIL ONCE DAILY 16 g 0   GNP VITAMIN C 500 MG tablet Take 500 mg by mouth daily.     lenalidomide  (REVLIMID ) 10 MG capsule Take 1 capsule (10 mg total) by mouth daily. 21 days on, 7 days off every 28 days 21 capsule 0   levocetirizine (XYZAL ) 5 MG tablet TAKE ONE TABLET BY MOUTH IN THE EVENING 30 tablet 3   lidocaine  (LIDODERM ) 5 % Place 1 patch onto the skin daily. Remove & Discard patch within 12 hours  or as directed by MD 30 patch 0   magnesium  oxide (MAG-OX) 400 (240 Mg) MG tablet TAKE ONE TABLET BY MOUTH TWICE DAILY 60 tablet 4   metoprolol  tartrate (LOPRESSOR ) 50 MG tablet Take 1 tablet (50 mg total) by mouth 2 (two) times daily. 180 tablet 1   montelukast  (SINGULAIR ) 10 MG tablet TAKE ONE TABLET BY MOUTH AT BEDTIME 30 tablet 3   MYRBETRIQ  50 MG TB24 tablet TAKE ONE TABLET BY MOUTH ONCE DAILY 30 tablet 3   nitroGLYCERIN (NITROSTAT) 0.4 MG SL tablet Place 0.4 mg under the tongue every 5 (five) minutes as needed for chest pain.     pantoprazole  (PROTONIX ) 40 MG tablet  TAKE ONE TABLET BY MOUTH TWICE DAILY 90 tablet 3   potassium chloride  SA (KLOR-CON  M) 20 MEQ tablet Take 1 tablet (20 mEq total) by mouth 2 (two) times daily. 60 tablet 3   pregabalin  (LYRICA ) 50 MG capsule TAKE ONE CAPSULE BY MOUTH TWICE DAILY 60 capsule 1   PREMARIN vaginal cream Place vaginally.     Probiotic Product (GNP PROBIOTIC COLON SUPPORT) CAPS Take 1 capsule by mouth daily.     sertraline  (ZOLOFT ) 50 MG tablet Take 50 mg by mouth daily.     simvastatin  (ZOCOR ) 20 MG tablet Take 20 mg by mouth at bedtime.     topiramate (TOPAMAX) 25 MG tablet Take 25 mg by mouth 2 (two) times daily.     traMADol  (ULTRAM ) 50 MG tablet Take 1 tablet (50 mg total) by mouth every 6 (six) hours as needed. 20 tablet 0   warfarin (COUMADIN) 6 MG tablet Take 6 mg by mouth at bedtime.     busPIRone  (BUSPAR ) 5 MG tablet Take 5 mg by mouth daily. (Patient not taking: Reported on 11/29/2023)     Calcium Carb-Cholecalciferol 600-10 MG-MCG TABS Take 1 tablet by mouth 2 (two) times daily. (Patient not taking: Reported on 11/29/2023)     Calcium Carbonate-Vitamin D  (CALCIUM 600+D PO) Take 1 tablet by mouth 2 (two) times daily. (Patient not taking: Reported on 11/29/2023)     chlorthalidone (HYGROTON) 25 MG tablet Take 25 mg by mouth daily. Stop taking today per Dr.Katragadda (Patient not taking: Reported on 11/29/2023)     Na Sulfate-K Sulfate-Mg Sulfate concentrate (SUPREP) 17.5-3.13-1.6 GM/177ML SOLN As directed (Patient not taking: Reported on 11/29/2023) 354 mL 0   primidone (MYSOLINE) 50 MG tablet TAKE ONE TABLET BY MOUTH TWICE DAILY MORNING AND BEDTIME (Patient not taking: Reported on 11/29/2023) 60 tablet 5   No current facility-administered medications for this visit.    Allergies: Allergies  Allergen Reactions   Amoxicillin-Pot Clavulanate Other (See Comments)   Clarithromycin Other (See Comments)    Stomach problems   Erythromycin    Lisinopril Swelling   Amoxicillin Diarrhea    Past Medical History:   Diagnosis Date   Acute myocardial infarction [facility]) 2009   CAD/no stent medically managed   Allergy    Anxiety disorder    CAD (coronary artery disease)    Cancer (HCC)    multiple myeloma   CHF (congestive heart failure) (HCC) 959879   Glaucoma 967974   Hyperlipidemia    Hypertension    IBS (irritable bowel syndrome)    Non-ulcer dyspepsia 04/30/2009   Qualifier: Diagnosis of  By: Harvey MD, Sandi L    Overactive bladder    PE (pulmonary embolism) 10/2012   left lung   Presence of permanent cardiac pacemaker    Sleep apnea    Past Surgical  History:  Procedure Laterality Date   ABDOMINAL HYSTERECTOMY     BSO secondary to cyst     CARDIAC CATHETERIZATION     CATARACT EXTRACTION W/PHACO Left 11/16/2023   Procedure: PHACOEMULSIFICATION, CATARACT, WITH IOL INSERTION 5.45, 00:43.2;  Surgeon: Enola Feliciano Hugger, MD;  Location: [facility];  Service: Ophthalmology;  Laterality: Left;   COLONOSCOPY  12/2009   Dr. Rhoda, propofol , normal. Next TCS 12/2019   COLONOSCOPY N/A 05/22/2017   Examined portion ileum was normal, significant looping of colon, external hemorrhoids and rectal bleeding due to internal hemorrhoids, mild diverticulosis procedure: COLONOSCOPY;  Surgeon: Harvey Margo CROME, MD;  [facility];  Service: Endoscopy;  Laterality: N/A;  10:30am   COLONOSCOPY N/A 11/03/2023   Procedure: COLONOSCOPY;  Surgeon: Cindie Carlin POUR, DO;  Location: [facility];  Service: Endoscopy;  Laterality: N/A;  1:00 pm, asa 3   ESOPHAGOGASTRODUODENOSCOPY  05/11/2009   schatzki ring/small hiatal hernia/path:gastritis   ESOPHAGOGASTRODUODENOSCOPY N/A 05/22/2017   Multiple gastric polyps with biopsy benign fundic gland polyp, small bowel biopsy negative for celiac, gastric biopsy with mild gastritis but no H. pylori procedure: ESOPHAGOGASTRODUODENOSCOPY (EGD);  Surgeon: Harvey Margo CROME, MD;  Location: [facility];  Service: Endoscopy;  Laterality: N/A;    ESOPHAGOGASTRODUODENOSCOPY N/A 11/03/2023   Procedure: EGD (ESOPHAGOGASTRODUODENOSCOPY);  Surgeon: Cindie Carlin POUR, DO;  Location: [facility];  Service: Endoscopy;  Laterality: N/A;   ESOPHAGOGASTRODUODENOSCOPY (EGD) WITH ESOPHAGEAL DILATION N/A 04/01/2013   DOQ:dumprulmz at the gastroesophageal juction/multiple small polyps/mild gastritis   GIVENS CAPSULE STUDY N/A 06/07/2017   Frequent gastric erosions, normal small bowel mucosa.  Procedure: GIVENS CAPSULE STUDY;  Surgeon: Harvey Margo CROME, MD;  Location: [facility];  Service: Endoscopy;  Laterality: N/A;  7:30am   INSERT / REPLACE / REMOVE PACEMAKER     Last year per pt.(can't remember date)   JOINT REPLACEMENT  [phone]   REPLACEMENT TOTAL KNEE Right 08/2020   Family History  Problem Relation Age of Onset   Anxiety disorder Mother    Cancer Mother    Heart disease Father    Hypertension Father    Colon polyps Neg Hx    Colon cancer Neg Hx    Social History   Socioeconomic History   Marital status: Married    Spouse name: Doctor, general practice   Number of children: 3   Years of education: 16   Highest education level: Master's degree (e.g., MA, MS, MEng, MEd, MSW, MBA)  Occupational History   Not on file  Tobacco Use   Smoking status: Never    Passive exposure: Never   Smokeless tobacco: Never   Tobacco comments:    Never smoked   Vaping Use   Vaping status: Never Used  Substance and Sexual Activity   Alcohol use: Not Currently    Comment: occ wine   Drug use: No   Sexual activity: Not Currently  Other Topics Concern   Not on file  Social History Narrative      Lives with husband-51 years 952 in Aug 2021   Daughter is close by, 2 sons live further away   One IN [place],ONE IN [place], ONE BESIDE HER.        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   Enjoys: reading, young adult      Diet: eats all food groups    Caffeine: coffee 1, tea daily soda-1 daily   Water : 1-2 cups      Wears seat belt  Does not use  phone while driving   Smoke detectors at home    Licensed conveyancer -locked up      Left handed   One story home   Drinks caffeine   Social Drivers of Health   Financial Resource Strain: Low Risk  (06/26/2023)   Overall Financial Resource Strain (CARDIA)    Difficulty of Paying Living Expenses: Not hard at all  Food Insecurity: No Food Insecurity (10/18/2023)   Hunger Vital Sign    Worried About Running Out of Food in the Last Year: Never true    Ran Out of Food in the Last Year: Never true  Transportation Needs: No Transportation Needs (10/18/2023)   PRAPARE - Administrator, Civil Service (Medical): No    Lack of Transportation (Non-Medical): No  Physical Activity: Insufficiently Active (06/26/2023)   Exercise Vital Sign    Days of Exercise per Week: 2 days    Minutes of Exercise per Session: 20 min  Stress: Stress Concern Present (06/26/2023)   [employer] of Occupational Health - Occupational Stress Questionnaire    Feeling of Stress : To some extent  Social Connections: Socially Integrated (06/26/2023)   Social Connection and Isolation Panel    Frequency of Communication with Friends and Family: Twice a week    Frequency of Social Gatherings with Friends and Family: Once a week    Attends Religious Services: 1 to 4 times per year    Active Member of [employer] or Organizations: Yes    Attends Banker Meetings: 1 to 4 times per year    Marital Status: Married  Catering manager Violence: Not At Risk (10/18/2023)   Humiliation, Afraid, Rape, and Kick questionnaire    Fear of Current or Ex-Partner: No    Emotionally Abused: No    Physically Abused: No    Sexually Abused: No    Review of Systems Constitutional: Patient denies any unintentional weight loss or change in strength lntegumentary: Patient denies any rashes or pruritus Cardiovascular: Patient denies chest pain or syncope Respiratory: Patient denies shortness of  breath Gastrointestinal: As per HPI Musculoskeletal: Patient denies muscle cramps or weakness Neurologic: Patient denies convulsions or seizures Allergic/Immunologic: Patient denies recent allergic reaction(s) Hematologic/Lymphatic: Patient denies bleeding tendencies Endocrine: Patient denies heat/cold intolerance  GU: As per HPI.  OBJECTIVE Vitals:   11/29/23 1102  BP: (!) 76/51  Pulse: 85   There is no height or weight on file to calculate BMI.  Physical Examination Constitutional: No obvious distress; patient is non-toxic appearing  Cardiovascular: No visible lower extremity edema.  Respiratory: The patient does not have audible wheezing/stridor; respirations do not appear labored  Gastrointestinal: Abdomen non-distended Musculoskeletal: Normal ROM of UEs  Skin: No obvious rashes/open sores  Neurologic: CN 2-12 grossly intact Psychiatric: Answered questions appropriately with normal affect  Hematologic/Lymphatic/Immunologic: No obvious bruises or sites of spontaneous bleeding   ASSESSMENT Urinary retention - Plan: Bladder Voiding Trial, BLADDER SCAN AMB NON-IMAGING  Midline cystocele  Vaginal wall prolapse  OAB (overactive bladder)  History of constipation  Memory difficulty  Passed voiding trial. Foley catheter discontinued. Advised to maintain adequate hydration and to notify Urology if she starts having difficulty voiding. We reviewed the importance of avoiding/managing constipation. She is scheduled to see GYN next Wednesday 12/06/2023 for her pelvic organ prolapse; encouraged her to follow through with their recommendations. We agreed to plan for follow up in 3-4 weeks for recheck including PVR. Patient and daughter verbalized understanding of  and agreement with current plan. All questions were answered.  PLAN Advised the following: 1. Foley catheter discontinued. 2. Return in about 3 weeks (around 12/20/2023) for retention / incomplete bladder emptying, with UA  & PVR.  Orders Placed This Encounter  Procedures   Bladder Voiding Trial   BLADDER SCAN AMB NON-IMAGING    It has been explained that the patient is to follow regularly with their PCP in addition to all other providers involved in their care and to follow instructions provided by these respective offices. Patient advised to contact urology clinic if any urologic-pertaining questions, concerns, new symptoms or problems arise in the interim period.  There are no Patient Instructions on file for this visit.  Electronically signed by:  Lauraine JAYSON Oz, FNP   11/29/23    12:20 PM

## 2023-11-29 NOTE — Progress Notes (Signed)
 Fill and Pull Catheter Removal  Patient is present today for a catheter removal.  325 ml of sterile water  was instilled into the bladder when the patient felt the urge to urinate. 10 ml of water  was then drained from the balloon.  A 16 FR foley cath was removed from the bladder no complications were noted .  Foley catheter intact and time of removal. Patient as then given some time to void on their own.  Patient can void, pt missed the nun hat ml on their own after some time.  Patient tolerated well.  One oral prophylactic antibiotic given per MD orders  Performed by: Carlos, CMA  Follow up/ Additional notes: Keep follow up

## 2023-11-30 ENCOUNTER — Inpatient Hospital Stay

## 2023-12-04 ENCOUNTER — Other Ambulatory Visit: Payer: Self-pay

## 2023-12-04 ENCOUNTER — Ambulatory Visit: Admitting: Family Medicine

## 2023-12-04 ENCOUNTER — Other Ambulatory Visit: Payer: Self-pay | Admitting: Family Medicine

## 2023-12-04 ENCOUNTER — Other Ambulatory Visit: Payer: Self-pay | Admitting: *Deleted

## 2023-12-04 DIAGNOSIS — K589 Irritable bowel syndrome without diarrhea: Secondary | ICD-10-CM

## 2023-12-04 DIAGNOSIS — J301 Allergic rhinitis due to pollen: Secondary | ICD-10-CM

## 2023-12-04 MED ORDER — LENALIDOMIDE 10 MG PO CAPS: ORAL_CAPSULE | ORAL | 0 refills | Status: DC

## 2023-12-06 ENCOUNTER — Ambulatory Visit: Admitting: Obstetrics and Gynecology

## 2023-12-06 ENCOUNTER — Other Ambulatory Visit: Payer: Self-pay

## 2023-12-06 DIAGNOSIS — C9000 Multiple myeloma not having achieved remission: Secondary | ICD-10-CM

## 2023-12-07 ENCOUNTER — Ambulatory Visit: Admitting: Urology

## 2023-12-07 ENCOUNTER — Inpatient Hospital Stay

## 2023-12-07 ENCOUNTER — Inpatient Hospital Stay: Attending: Hematology

## 2023-12-07 VITALS — BP 99/65 | HR 74 | Temp 96.8°F | Resp 18 | Wt 130.5 lb

## 2023-12-07 DIAGNOSIS — D509 Iron deficiency anemia, unspecified: Secondary | ICD-10-CM

## 2023-12-07 DIAGNOSIS — Z86711 Personal history of pulmonary embolism: Secondary | ICD-10-CM | POA: Insufficient documentation

## 2023-12-07 DIAGNOSIS — C9000 Multiple myeloma not having achieved remission: Secondary | ICD-10-CM

## 2023-12-07 DIAGNOSIS — Z79899 Other long term (current) drug therapy: Secondary | ICD-10-CM | POA: Diagnosis not present

## 2023-12-07 DIAGNOSIS — Z5112 Encounter for antineoplastic immunotherapy: Secondary | ICD-10-CM | POA: Diagnosis present

## 2023-12-07 DIAGNOSIS — E876 Hypokalemia: Secondary | ICD-10-CM

## 2023-12-07 DIAGNOSIS — G629 Polyneuropathy, unspecified: Secondary | ICD-10-CM | POA: Diagnosis not present

## 2023-12-07 DIAGNOSIS — Z7901 Long term (current) use of anticoagulants: Secondary | ICD-10-CM | POA: Insufficient documentation

## 2023-12-07 LAB — COMPREHENSIVE METABOLIC PANEL WITH GFR
ALT: 42 U/L (ref 0–44)
AST: 25 U/L (ref 15–41)
Albumin: 3.1 g/dL — ABNORMAL LOW (ref 3.5–5.0)
Alkaline Phosphatase: 71 U/L (ref 38–126)
Anion gap: 12 (ref 5–15)
BUN: 25 mg/dL — ABNORMAL HIGH (ref 8–23)
CO2: 24 mmol/L (ref 22–32)
Calcium: 8.9 mg/dL (ref 8.9–10.3)
Chloride: 102 mmol/L (ref 98–111)
Creatinine, Ser: 0.85 mg/dL (ref 0.44–1.00)
GFR, Estimated: >60 mL/min (ref >60)
Glucose, Bld: 98 mg/dL (ref 70–99)
Potassium: 3.2 mmol/L — ABNORMAL LOW (ref 3.5–5.1)
Sodium: 138 mmol/L (ref 135–145)
Total Bilirubin: 0.4 mg/dL (ref 0.0–1.2)
Total Protein: 7.6 g/dL (ref 6.5–8.1)

## 2023-12-07 LAB — CBC WITH DIFFERENTIAL/PLATELET
Abs Immature Granulocytes: 0.06 K/uL (ref 0.00–0.07)
Basophils Absolute: 0 K/uL (ref 0.0–0.1)
Basophils Relative: 0 %
Eosinophils Absolute: 0.4 K/uL (ref 0.0–0.5)
Eosinophils Relative: 5 %
HCT: 31.6 % — ABNORMAL LOW (ref 36.0–46.0)
Hemoglobin: 10.2 g/dL — ABNORMAL LOW (ref 12.0–15.0)
Immature Granulocytes: 1 %
Lymphocytes Relative: 15 %
Lymphs Abs: 1.1 K/uL (ref 0.7–4.0)
MCH: 33.3 pg (ref 26.0–34.0)
MCHC: 32.3 g/dL (ref 30.0–36.0)
MCV: 103.3 fL — ABNORMAL HIGH (ref 80.0–100.0)
Monocytes Absolute: 0.6 K/uL (ref 0.1–1.0)
Monocytes Relative: 8 %
Neutro Abs: 5.5 K/uL (ref 1.7–7.7)
Neutrophils Relative %: 71 %
Platelets: 236 K/uL (ref 150–400)
RBC: 3.06 MIL/uL — ABNORMAL LOW (ref 3.87–5.11)
RDW: 14.3 % (ref 11.5–15.5)
WBC: 7.7 K/uL (ref 4.0–10.5)
nRBC: 0 % (ref 0.0–0.2)

## 2023-12-07 MED ORDER — PROCHLORPERAZINE MALEATE 10 MG PO TABS
10.0000 mg | ORAL_TABLET | Freq: Once | ORAL | Status: AC
  Administered 2023-12-07: 10 mg via ORAL
  Filled 2023-12-07: qty 1

## 2023-12-07 MED ORDER — DENOSUMAB 120 MG/1.7ML ~~LOC~~ SOLN
120.0000 mg | Freq: Once | SUBCUTANEOUS | Status: AC
  Administered 2023-12-07: 120 mg via SUBCUTANEOUS
  Filled 2023-12-07: qty 1.7

## 2023-12-07 MED ORDER — POTASSIUM CHLORIDE CRYS ER 20 MEQ PO TBCR
40.0000 meq | EXTENDED_RELEASE_TABLET | Freq: Once | ORAL | Status: AC
  Administered 2023-12-07: 40 meq via ORAL
  Filled 2023-12-07: qty 2

## 2023-12-07 MED ORDER — BORTEZOMIB CHEMO SQ INJECTION 3.5 MG (2.5MG/ML)
0.7000 mg/m2 | Freq: Once | INTRAMUSCULAR | Status: AC
  Administered 2023-12-07: 1.25 mg via SUBCUTANEOUS
  Filled 2023-12-07: qty 0.5

## 2023-12-07 NOTE — Progress Notes (Signed)
 Patient tolerated injection with no complaints voiced.  Site clean and dry with no bruising or swelling noted at site.  See MAR for details.  Band aid applied.  Patient stable during and after injection.  Vss with discharge and left in satisfactory condition with no s/s of distress noted.

## 2023-12-07 NOTE — Patient Instructions (Signed)
 [facility] [place] - A DEPT OF [facility]  Discharge Instructions: Thank you for choosing [facility] to provide your oncology and hematology care.  If you have a lab appointment with the Cancer Center - please note that after April 8th, 2024, all labs will be drawn in the cancer center.  You do not have to check in or register with the main entrance as you have in the past but will complete your check-in in the cancer center.  Wear comfortable clothing and clothing appropriate for easy access to any Portacath or PICC line.   We strive to give you quality time with your provider. You may need to reschedule your appointment if you arrive late (15 or more minutes).  Arriving late affects you and other patients whose appointments are after yours.  Also, if you miss three or more appointments without notifying the office, you may be dismissed from the clinic at the provider's discretion.      For prescription refill requests, have your pharmacy contact our office and allow 72 hours for refills to be completed.    Today you received the following chemotherapy and/or immunotherapy agents velcade and xgeva.       To help prevent nausea and vomiting after your treatment, we encourage you to take your nausea medication as directed.  BELOW ARE SYMPTOMS THAT SHOULD BE REPORTED IMMEDIATELY: *FEVER GREATER THAN 100.4 F (38 C) OR HIGHER *CHILLS OR SWEATING *NAUSEA AND VOMITING THAT IS NOT CONTROLLED WITH YOUR NAUSEA MEDICATION *UNUSUAL SHORTNESS OF BREATH *UNUSUAL BRUISING OR BLEEDING *URINARY PROBLEMS (pain or burning when urinating, or frequent urination) *BOWEL PROBLEMS (unusual diarrhea, constipation, pain near the anus) TENDERNESS IN MOUTH AND THROAT WITH OR WITHOUT PRESENCE OF ULCERS (sore throat, sores in mouth, or a toothache) UNUSUAL RASH, SWELLING OR PAIN  UNUSUAL VAGINAL DISCHARGE OR ITCHING   Items with * indicate a potential emergency and should be  followed up as soon as possible or go to the Emergency Department if any problems should occur.  Please show the CHEMOTHERAPY ALERT CARD or IMMUNOTHERAPY ALERT CARD at check-in to the Emergency Department and triage nurse.  Should you have questions after your visit or need to cancel or reschedule your appointment, please contact [facility] [place] - A DEPT OF [facility] [phone]  and follow the prompts.  Office hours are 8:00 a.m. to 4:30 p.m. Monday - Friday. Please note that voicemails left after 4:00 p.m. may not be returned until the following business day.  We are closed weekends and major holidays. You have access to a nurse at all times for urgent questions. Please call the main number to the clinic [phone] and follow the prompts.  For any non-urgent questions, you may also contact your provider using MyChart. We now offer e-Visits for anyone 53 and older to request care online for non-urgent symptoms. For details visit [email or web address].   Also download the MyChart app! Go to the app store, search "MyChart", open the app, select [facility], and log in with your MyChart username and password.

## 2023-12-07 NOTE — Progress Notes (Signed)
 Patient presents today for chemotherapy infusion.  Patient is in satisfactory condition with no new complaints voiced.  Vital signs are stable.  Labs reviewed and all labs are within treatment parameters. Potassium 3.2, 40mEq potassium to be given PO per Dr Charmain orders. We will proceed with treatment per MD orders.    Patient is to have Xgeva  injection today, calcium 8.9. Patient verified that she does take calcium and vitamin D  and no dental work planned.

## 2023-12-11 ENCOUNTER — Emergency Department (HOSPITAL_COMMUNITY)

## 2023-12-11 ENCOUNTER — Encounter (HOSPITAL_COMMUNITY): Payer: Self-pay | Admitting: Emergency Medicine

## 2023-12-11 ENCOUNTER — Emergency Department (HOSPITAL_COMMUNITY)
Admission: EM | Admit: 2023-12-11 | Discharge: 2023-12-11 | Disposition: A | Discharge status: Home / Self Care | Source: Ambulatory Visit | Attending: Emergency Medicine | Admitting: Emergency Medicine

## 2023-12-11 ENCOUNTER — Other Ambulatory Visit: Payer: Self-pay

## 2023-12-11 ENCOUNTER — Ambulatory Visit (INDEPENDENT_AMBULATORY_CARE_PROVIDER_SITE_OTHER): Admitting: Family Medicine

## 2023-12-11 ENCOUNTER — Encounter: Payer: Self-pay | Admitting: Family Medicine

## 2023-12-11 VITALS — BP 99/58 | HR 71 | Wt 131.0 lb

## 2023-12-11 DIAGNOSIS — S0093XA Contusion of unspecified part of head, initial encounter: Secondary | ICD-10-CM

## 2023-12-11 DIAGNOSIS — M542 Cervicalgia: Secondary | ICD-10-CM | POA: Diagnosis present

## 2023-12-11 DIAGNOSIS — Z7982 Long term (current) use of aspirin: Secondary | ICD-10-CM | POA: Insufficient documentation

## 2023-12-11 DIAGNOSIS — W19XXXA Unspecified fall, initial encounter: Secondary | ICD-10-CM

## 2023-12-11 DIAGNOSIS — S161XXA Strain of muscle, fascia and tendon at neck level, initial encounter: Secondary | ICD-10-CM | POA: Diagnosis not present

## 2023-12-11 DIAGNOSIS — E860 Dehydration: Secondary | ICD-10-CM | POA: Insufficient documentation

## 2023-12-11 DIAGNOSIS — Z7901 Long term (current) use of anticoagulants: Secondary | ICD-10-CM | POA: Diagnosis not present

## 2023-12-11 DIAGNOSIS — E876 Hypokalemia: Secondary | ICD-10-CM

## 2023-12-11 DIAGNOSIS — S0003XA Contusion of scalp, initial encounter: Secondary | ICD-10-CM | POA: Diagnosis not present

## 2023-12-11 LAB — CBC WITH DIFFERENTIAL/PLATELET
Abs Immature Granulocytes: 0.06 K/uL (ref 0.00–0.07)
Basophils Absolute: 0 K/uL (ref 0.0–0.1)
Basophils Relative: 0 %
Eosinophils Absolute: 0.3 K/uL (ref 0.0–0.5)
Eosinophils Relative: 5 %
HCT: 31.1 % — ABNORMAL LOW (ref 36.0–46.0)
Hemoglobin: 10.1 g/dL — ABNORMAL LOW (ref 12.0–15.0)
Immature Granulocytes: 1 %
Lymphocytes Relative: 22 %
Lymphs Abs: 1.1 K/uL (ref 0.7–4.0)
MCH: 33.8 pg (ref 26.0–34.0)
MCHC: 32.5 g/dL (ref 30.0–36.0)
MCV: 104 fL — ABNORMAL HIGH (ref 80.0–100.0)
Monocytes Absolute: 0.5 K/uL (ref 0.1–1.0)
Monocytes Relative: 10 %
Neutro Abs: 3.2 K/uL (ref 1.7–7.7)
Neutrophils Relative %: 62 %
Platelets: 176 K/uL (ref 150–400)
RBC: 2.99 MIL/uL — ABNORMAL LOW (ref 3.87–5.11)
RDW: 14.6 % (ref 11.5–15.5)
WBC: 5.2 K/uL (ref 4.0–10.5)
nRBC: 0 % (ref 0.0–0.2)

## 2023-12-11 LAB — COMPREHENSIVE METABOLIC PANEL WITH GFR
ALT: 26 U/L (ref 0–44)
AST: 19 U/L (ref 15–41)
Albumin: 3.2 g/dL — ABNORMAL LOW (ref 3.5–5.0)
Alkaline Phosphatase: 62 U/L (ref 38–126)
Anion gap: 12 (ref 5–15)
BUN: 21 mg/dL (ref 8–23)
CO2: 24 mmol/L (ref 22–32)
Calcium: 8.1 mg/dL — ABNORMAL LOW (ref 8.9–10.3)
Chloride: 100 mmol/L (ref 98–111)
Creatinine, Ser: 0.98 mg/dL (ref 0.44–1.00)
GFR, Estimated: >60 mL/min (ref >60)
Glucose, Bld: 82 mg/dL (ref 70–99)
Potassium: 2.9 mmol/L — ABNORMAL LOW (ref 3.5–5.1)
Sodium: 136 mmol/L (ref 135–145)
Total Bilirubin: 0.3 mg/dL (ref 0.0–1.2)
Total Protein: 7.2 g/dL (ref 6.5–8.1)

## 2023-12-11 LAB — PROTIME-INR
INR: 2 — ABNORMAL HIGH (ref 0.8–1.2)
Prothrombin Time: 23.9 s — ABNORMAL HIGH (ref 11.4–15.2)

## 2023-12-11 MED ORDER — ACETAMINOPHEN 325 MG PO TABS
650.0000 mg | ORAL_TABLET | Freq: Once | ORAL | Status: AC
  Administered 2023-12-11: 650 mg via ORAL
  Filled 2023-12-11: qty 2

## 2023-12-11 MED ORDER — POTASSIUM CHLORIDE ER 10 MEQ PO TBCR: 10.0000 meq | EXTENDED_RELEASE_TABLET | Freq: Every day | ORAL | 0 refills | Status: DC

## 2023-12-11 MED ORDER — POTASSIUM CHLORIDE CRYS ER 20 MEQ PO TBCR
40.0000 meq | EXTENDED_RELEASE_TABLET | Freq: Once | ORAL | Status: AC
  Administered 2023-12-11: 40 meq via ORAL
  Filled 2023-12-11: qty 2

## 2023-12-11 NOTE — Assessment & Plan Note (Signed)
 Encouraged to follow-up in the emergency department for urgent evaluation and imaging, given her neurological symptom of a headache following the fall.

## 2023-12-11 NOTE — ED Triage Notes (Signed)
 Pt to the ED after a fall last night in the kitchen on tile. Pt hit her head and has a hematoma.  Pt denies losing consciousness   Pt is complaining of a headache today.

## 2023-12-11 NOTE — ED Provider Notes (Signed)
 [facility] AT [facility] Provider Note   CSN: [id number] Arrival date & time: 12/11/23  1535     Patient presents with: Courtney Rogers is a 73 y.o. female.   Patient is a 73 year old female who presents to the emergency department the chief complaint of headache and neck pain following a fall which occurred last night.  She notes that she fell over her walker striking the posterior aspect of her head on the ground.  She is currently on warfarin.  She denies any pain to her thoracic or lumbar spine.  She denies any other long bone or joint pain at this time.  She denies any chest pain or abdominal pain.  She has had no numbness, paresthesias or weakness.  She denies any dizziness or lightheadedness at this time.  There was no associated syncope.   Fall Associated symptoms include headaches.       Prior to Admission medications   Medication Sig Start Date End Date Taking? Authorizing Provider  potassium chloride  (KLOR-CON ) 10 MEQ tablet Take 1 tablet (10 mEq total) by mouth daily for 5 days. 12/11/23 12/16/23 Yes Daralene Bruckner D, PA-C  acetaminophen  (TYLENOL ) 500 MG tablet Take 1,000 mg by mouth 2 (two) times daily as needed for moderate pain or headache.    [provider]  acyclovir  (ZOVIRAX ) 400 MG tablet TAKE ONE TABLET BY MOUTH TWICE DAILY 07/04/23   Rogers Hai, MD  ASPIRIN LOW DOSE 81 MG tablet Take 81 mg by mouth daily. 09/26/23   [provider]  B Complex-Folic Acid  (B COMPLEX VITAMINS, W/ FA,) CAPS Take 1 capsule by mouth daily. 09/09/21   [provider]  busPIRone  (BUSPAR ) 5 MG tablet Take 5 mg by mouth daily. Patient not taking: Reported on 11/29/2023    Ailene Gambles, MD  Calcium Carb-Cholecalciferol 600-10 MG-MCG TABS Take 1 tablet by mouth 2 (two) times daily. Patient not taking: Reported on 11/29/2023 08/31/23   [provider]  Calcium Carbonate-Vitamin D  (CALCIUM 600+D PO) Take 1  tablet by mouth 2 (two) times daily. Patient not taking: Reported on 11/29/2023    [provider]  chlorthalidone (HYGROTON) 25 MG tablet Take 25 mg by mouth daily. Stop taking today per Dr.Katragadda Patient not taking: Reported on 11/29/2023 06/04/22   [provider]  cholecalciferol (VITAMIN D3) 10 MCG (400 UNIT) TABS tablet Take 400 Units by mouth daily. 09/30/22   [provider]  citalopram  (CELEXA ) 20 MG tablet Take 1 tablet (20 mg total) by mouth daily. 10/19/23   Bevely Doffing, FNP  cyanocobalamin  (VITAMIN B12) 1000 MCG tablet Take 1,000 mcg by mouth daily. 09/26/23   [provider]  diclofenac  Sodium (VOLTAREN ) 1 % GEL Apply 4 g topically 4 (four) times daily. 11/19/21   Zarwolo, [name], FNP  dicyclomine  (BENTYL ) 10 MG capsule TAKE ONE CAPSULE BY MOUTH THREE TIMES A DAY BEFORE meals 12/04/23   Bevely Doffing, FNP  FEROSUL 325 (65 Fe) MG tablet Take 325 mg by mouth daily. 07/28/23   [provider]  fluticasone  (FLONASE ) 50 MCG/ACT nasal spray USE 2 SPRAYS IN EACH NOSTRIL ONCE DAILY 06/21/23   Zarwolo, [name], FNP  GNP VITAMIN C 500 MG tablet Take 500 mg by mouth daily. 08/31/23   [provider]  lenalidomide  (REVLIMID ) 10 MG capsule Take 1 capsule (10 mg total) by mouth daily. 21 days on, 7 days off every 28 days 12/04/23   Katragadda, Sreedhar, MD  levocetirizine (XYZAL ) 5  MG tablet TAKE ONE TABLET BY MOUTH IN THE EVENING 12/04/23   Zarwolo, [name], FNP  lidocaine  (LIDODERM ) 5 % Place 1 patch onto the skin daily. Remove & Discard patch within 12 hours or as directed by MD 10/01/23   Idol, Julie, PA-C  magnesium  oxide (MAG-OX) 400 (240 Mg) MG tablet TAKE ONE TABLET BY MOUTH TWICE DAILY 08/18/23   Rogers Hai, MD  metoprolol  tartrate (LOPRESSOR ) 50 MG tablet Take 1 tablet (50 mg total) by mouth 2 (two) times daily. 10/07/21   Paseda, Folashade R, FNP  montelukast  (SINGULAIR ) 10 MG tablet TAKE ONE TABLET BY MOUTH AT BEDTIME 09/20/23   Zarwolo,  [name], FNP  MYRBETRIQ  50 MG TB24 tablet TAKE ONE TABLET BY MOUTH ONCE DAILY 09/20/23   Zarwolo, [name], FNP  Na Sulfate-K Sulfate-Mg Sulfate concentrate (SUPREP) 17.5-3.13-1.6 GM/177ML SOLN As directed Patient not taking: Reported on 11/29/2023 10/11/23   Carver, Charles K, DO  nitroGLYCERIN (NITROSTAT) 0.4 MG SL tablet Place 0.4 mg under the tongue every 5 (five) minutes as needed for chest pain.    [provider]  pantoprazole  (PROTONIX ) 40 MG tablet TAKE ONE TABLET BY MOUTH TWICE DAILY 10/13/23   Zarwolo, [name], FNP  potassium chloride  SA (KLOR-CON  M) 20 MEQ tablet Take 1 tablet (20 mEq total) by mouth 2 (two) times daily. 09/04/23   Rogers Hai, MD  pregabalin  (LYRICA ) 50 MG capsule TAKE ONE CAPSULE BY MOUTH TWICE DAILY 11/09/23   Rogers Hai, MD  PREMARIN vaginal cream Place vaginally. 09/19/23   [provider]  primidone (MYSOLINE) 50 MG tablet TAKE ONE TABLET BY MOUTH TWICE DAILY 12/04/23   Zarwolo, [name], FNP  Probiotic Product (GNP PROBIOTIC COLON SUPPORT) CAPS Take 1 capsule by mouth daily. 07/08/21   [provider]  sertraline  (ZOLOFT ) 50 MG tablet Take 50 mg by mouth daily.    [provider]  simvastatin  (ZOCOR ) 20 MG tablet Take 20 mg by mouth at bedtime. 09/20/23   [provider]  topiramate (TOPAMAX) 25 MG tablet Take 25 mg by mouth 2 (two) times daily. 01/06/23   [provider]  traMADol  (ULTRAM ) 50 MG tablet Take 1 tablet (50 mg total) by mouth every 6 (six) hours as needed. 10/27/23   Zarwolo, [name], FNP  warfarin (COUMADIN) 6 MG tablet Take 6 mg by mouth at bedtime. 08/04/23   [provider]    Allergies: Amoxicillin-pot clavulanate, Clarithromycin, Erythromycin, Lisinopril, and Amoxicillin    Review of Systems  Musculoskeletal:  Positive for neck pain.  Neurological:  Positive for headaches.  All other systems reviewed and are negative.   Updated Vital Signs BP 112/70 (BP Location: Right Arm)    Pulse 64   Temp 98.1 F (36.7 C) (Oral)   Resp 16   Ht 5' 4 (1.626 m)   Wt 59 kg   SpO2 99%   BMI 22.31 kg/m   Physical Exam Vitals and nursing note reviewed.  Constitutional:      General: She is not in acute distress.    Appearance: Normal appearance. She is not ill-appearing.  HENT:     Head: Normocephalic and atraumatic.     Nose: Nose normal.     Mouth/Throat:     Mouth: Mucous membranes are moist.  Eyes:     Extraocular Movements: Extraocular movements intact.     Conjunctiva/sclera: Conjunctivae normal.     Pupils: Pupils are equal, round, and reactive to light.  Neck:     Comments: Mild midline tenderness Cardiovascular:  Rate and Rhythm: Normal rate and regular rhythm.     Pulses: Normal pulses.     Heart sounds: Normal heart sounds. No murmur heard. Pulmonary:     Effort: Pulmonary effort is normal. No respiratory distress.     Breath sounds: Normal breath sounds. No stridor. No wheezing, rhonchi or rales.  Chest:     Chest wall: No tenderness.  Abdominal:     General: Abdomen is flat. Bowel sounds are normal. There is no distension.     Palpations: Abdomen is soft.     Tenderness: There is no abdominal tenderness. There is no guarding.  Musculoskeletal:        General: No swelling, tenderness, deformity or signs of injury. Normal range of motion.     Cervical back: Normal range of motion and neck supple. No rigidity.     Right lower leg: No edema.     Left lower leg: No edema.     Comments: Nontender palpation over bilateral upper and lower extremities, full range of motion noted throughout, pelvis stable to AP and lateral compression, nontender palpation over thoracic or lumbar spine, no step-off or deformity  Skin:    General: Skin is warm and dry.     Findings: No rash.     Comments: Hematoma to posterior aspect of scalp  Neurological:     General: No focal deficit present.     Mental Status: She is alert and oriented to person, place, and time.  Mental status is at baseline.     Cranial Nerves: No cranial nerve deficit.     Sensory: No sensory deficit.     Motor: No weakness.     Coordination: Coordination normal.     Gait: Gait normal.  Psychiatric:        Mood and Affect: Mood normal.        Behavior: Behavior normal.        Thought Content: Thought content normal.        Judgment: Judgment normal.     (all labs ordered are listed, but only abnormal results are displayed) Labs Reviewed  COMPREHENSIVE METABOLIC PANEL WITH GFR - Abnormal; Notable for the following components:      Result Value   Potassium 2.9 (*)    Calcium 8.1 (*)    Albumin 3.2 (*)    All other components within normal limits  CBC WITH DIFFERENTIAL/PLATELET - Abnormal; Notable for the following components:   RBC 2.99 (*)    Hemoglobin 10.1 (*)    HCT 31.1 (*)    MCV 104.0 (*)    All other components within normal limits  PROTIME-INR - Abnormal; Notable for the following components:   Prothrombin Time 23.9 (*)    INR 2.0 (*)    All other components within normal limits    EKG: None  Radiology: CT Cervical Spine Wo Contrast Result Date: 12/11/2023 CLINICAL DATA:  Provided history: Ataxia, cervical trauma Fall last night in the kitchen.  Struck head.  Headache today. EXAM: CT CERVICAL SPINE WITHOUT CONTRAST TECHNIQUE: Multidetector CT imaging of the cervical spine was performed without intravenous contrast. Multiplanar CT image reconstructions were also generated. RADIATION DOSE REDUCTION: This exam was performed according to the departmental dose-optimization program which includes automated exposure control, adjustment of the mA and/or kV according to patient size and/or use of iterative reconstruction technique. COMPARISON:  None Available. FINDINGS: Alignment: Normal. Skull base and vertebrae: No acute fracture. Vertebral body heights are maintained. The dens and skull base  are intact. Soft tissues and spinal canal: No prevertebral fluid or  swelling. No visible canal hematoma. Disc levels: Degenerative pannus at C1-C2. Anterior spurring throughout, with mild disc space narrowing C5-C6 and C6-C7. Upper chest: No acute findings. Other: None. 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 Electronically Signed   By: Andrea Gasman M.D.   On: 12/11/2023 17:49   CT Head Wo Contrast Result Date: 12/11/2023 CLINICAL DATA:  Fall last night in the kitchen hitting head. Headache. EXAM: CT HEAD WITHOUT CONTRAST TECHNIQUE: Contiguous axial images were obtained from the base of the skull through the vertex without intravenous contrast. RADIATION DOSE REDUCTION: This exam was performed according to the departmental dose-optimization program which includes automated exposure control, adjustment of the mA and/or kV according to patient size and/or use of iterative reconstruction technique. COMPARISON:  Head CT 01/15/2023 FINDINGS: Brain: No intracranial hemorrhage, mass effect, or midline shift. Normal for age atrophy. No hydrocephalus. The basilar cisterns are patent. No evidence of territorial infarct or acute ischemia. Unchanged partially empty sella no extra-axial or intracranial fluid collection. Vascular: No hyperdense vessel or unexpected calcification. Skull: No fracture or focal lesion. Sinuses/Orbits: No acute findings. Other: Suspected left parietal scalp hematoma. IMPRESSION: No acute intracranial abnormality. No skull fracture. Electronically Signed   By: Andrea Gasman M.D.   On: 12/11/2023 17:45     Procedures   Medications Ordered in the ED  acetaminophen  (TYLENOL ) tablet 650 mg (650 mg Oral Given 12/11/23 1659)  potassium chloride  SA (KLOR-CON  M) CR tablet 40 mEq (40 mEq Oral Given 12/11/23 1857)                                    Medical Decision Making Amount and/or Complexity of Data Reviewed Labs: ordered. Radiology: ordered.  Risk OTC drugs. Prescription drug  management.   This patient presents to the ED for concern of fall, head injury differential diagnosis includes intracranial hemorrhage, closed head contusion, long bone or joint fracture, vertebral fracture, sprain, strain    Additional history obtained:  Additional history obtained from none External records from outside source obtained and reviewed including none   Lab Tests:  I Ordered, and personally interpreted labs.  The pertinent results include: No leukocytosis, anemia at baseline, hypokalemia noted, INR within acceptable limits, normal kidney function liver function   Imaging Studies ordered:  I ordered imaging studies including CT scan of head and cervical spine I independently visualized and interpreted imaging which showed no acute intracranial hemorrhage, no vertebral fracture I agree with the radiologist interpretation   Medicines ordered and prescription drug management:  I ordered medication including potassium, Tylenol  for headache and hypokalemia Reevaluation of the patient after these medicines showed that the patient improved I have reviewed the patients home medicines and have made adjustments as needed   Problem List / ED Course:  Patient is doing well at this time and is stable for discharge home.  Discussed with patient that the CT scan of her head and cervical spine was unremarkable.  Blood work did demonstrate a hypokalemia and did start potassium repletion in the emergency department.  Blood work has otherwise been unremarkable.  Patient notes that her fall was mechanical in nature with no preceding symptoms.  She was nontender to palpation over thoracic lumbar spine.  She had no other long bone or joint pain.  She was nontender to palpation over  her chest wall or abdomen.   Social Determinants of Health:  None        Final diagnoses:  Fall, initial encounter  Contusion of scalp, initial encounter  Strain of neck muscle, initial encounter   Hypokalemia    ED Discharge Orders          Ordered    potassium chloride  (KLOR-CON ) 10 MEQ tablet  Daily        12/11/23 1907               Daralene Lonni JONETTA DEVONNA 12/11/23 1930    Francesca Elsie CROME, MD 12/11/23 2111

## 2023-12-11 NOTE — Discharge Instructions (Signed)
 Please follow-up closely with your primary care doctor on an outpatient basis for recheck of your potassium.  Return to emergency department immediately for any new or worsening symptoms.

## 2023-12-11 NOTE — Progress Notes (Signed)
 Established Patient Office Visit  Subjective:  Patient ID: Courtney Rogers, female    DOB: 04/11/1951  Age: 73 y.o. MRN: [id number]  CC:  Chief Complaint  Patient presents with   Fall    Patient fell last night and bumped her head    HPI Courtney Rogers is a 73 y.o. female with past medical history of anxiety, GERD, fatigue, presents for f/u after sustaining a fall on 720 2025.  Fall:The patient reports a fall last night at approximately 4:30 PM. She was backing up to turn around when she tripped over the lid of a door and fell, hitting her head hard on the floor. She did not lose consciousness and denies nausea, vomiting, or dizziness. She complains of headaches, rating the headache pain today in the clinic as 8/10. She also reports that her neck is starting to hurt. The affected site on her head is tender to palpation. After the fall, she cried but has no lacerations or cuts on her head. She has been taking Tylenol  for pain relief. No other neurological symptoms have been noted    Past Medical History:  Diagnosis Date   Acute myocardial infarction [facility]) 2009   CAD/no stent medically managed   Allergy    Anxiety disorder    CAD (coronary artery disease)    Cancer (HCC)    multiple myeloma   CHF (congestive heart failure) (HCC) 959879   Glaucoma [id number]   Hyperlipidemia    Hypertension    IBS (irritable bowel syndrome)    Non-ulcer dyspepsia 04/30/2009   Qualifier: Diagnosis of  By: Harvey MD, Sandi L    Overactive bladder    PE (pulmonary embolism) 10/2012   left lung   Presence of permanent cardiac pacemaker    Sleep apnea     Past Surgical History:  Procedure Laterality Date   ABDOMINAL HYSTERECTOMY     BSO secondary to cyst     CARDIAC CATHETERIZATION     CATARACT EXTRACTION W/PHACO Left 11/16/2023   Procedure: PHACOEMULSIFICATION, CATARACT, WITH IOL INSERTION 5.45, 00:43.2;  Surgeon: Enola Feliciano Hugger, MD;  Location: [facility];  Service:  Ophthalmology;  Laterality: Left;   COLONOSCOPY  12/2009   Dr. Rhoda, propofol , normal. Next TCS 12/2019   COLONOSCOPY N/A 05/22/2017   Examined portion ileum was normal, significant looping of colon, external hemorrhoids and rectal bleeding due to internal hemorrhoids, mild diverticulosis procedure: COLONOSCOPY;  Surgeon: Harvey Margo CROME, MD;  Location: [facility];  Service: Endoscopy;  Laterality: N/A;  10:30am   COLONOSCOPY N/A 11/03/2023   Procedure: COLONOSCOPY;  Surgeon: Cindie Carlin POUR, DO;  Location: [facility];  Service: Endoscopy;  Laterality: N/A;  1:00 pm, asa 3   ESOPHAGOGASTRODUODENOSCOPY  05/11/2009   schatzki ring/small hiatal hernia/path:gastritis   ESOPHAGOGASTRODUODENOSCOPY N/A 05/22/2017   Multiple gastric polyps with biopsy benign fundic gland polyp, small bowel biopsy negative for celiac, gastric biopsy with mild gastritis but no H. pylori procedure: ESOPHAGOGASTRODUODENOSCOPY (EGD);  Surgeon: Harvey Margo CROME, MD;  Location: [facility];  Service: Endoscopy;  Laterality: N/A;   ESOPHAGOGASTRODUODENOSCOPY N/A 11/03/2023   Procedure: EGD (ESOPHAGOGASTRODUODENOSCOPY);  Surgeon: Cindie Carlin POUR, DO;  Location: [facility];  Service: Endoscopy;  Laterality: N/A;   ESOPHAGOGASTRODUODENOSCOPY (EGD) WITH ESOPHAGEAL DILATION N/A 04/01/2013   DOQ:dumprulmz at the gastroesophageal juction/multiple small polyps/mild gastritis   GIVENS CAPSULE STUDY N/A 06/07/2017   Frequent gastric erosions, normal small bowel mucosa.  Procedure: GIVENS CAPSULE STUDY;  Surgeon: Fields, Sandi L,  MD;  Location: [facility];  Service: Endoscopy;  Laterality: N/A;  7:30am   INSERT / REPLACE / REMOVE PACEMAKER     Last year per pt.(can't remember date)   JOINT REPLACEMENT  [id number]   REPLACEMENT TOTAL KNEE Right 08/2020    Family History  Problem Relation Age of Onset   Anxiety disorder Mother    Cancer Mother    Heart disease Father    Hypertension Father    Colon polyps Neg Hx     Colon cancer Neg Hx     Social History   Socioeconomic History   Marital status: Married    Spouse name: Doctor, general practice   Number of children: 3   Years of education: 16   Highest education level: Master's degree (e.g., MA, MS, MEng, MEd, MSW, MBA)  Occupational History   Not on file  Tobacco Use   Smoking status: Never    Passive exposure: Never   Smokeless tobacco: Never   Tobacco comments:    Never smoked   Vaping Use   Vaping status: Never Used  Substance and Sexual Activity   Alcohol use: Not Currently    Comment: occ wine   Drug use: No   Sexual activity: Not Currently  Other Topics Concern   Not on file  Social History Narrative      Lives with husband-51 years 950 in Aug 2021   Daughter is close by, 2 sons live further away   One IN [place],ONE IN [place], ONE BESIDE HER.        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   Enjoys: reading, young adult      Diet: eats all food groups    Caffeine: coffee 1, tea daily soda-1 daily   Water : 1-2 cups      Wears seat belt    Does not use phone while driving   Smoke detectors at home    Licensed conveyancer -locked up      Left handed   One story home   Drinks caffeine   Social Drivers of Health   Financial Resource Strain: Low Risk  (12/07/2023)   Overall Financial Resource Strain (CARDIA)    Difficulty of Paying Living Expenses: Not hard at all  Food Insecurity: No Food Insecurity (12/07/2023)   Hunger Vital Sign    Worried About Running Out of Food in the Last Year: Never true    Ran Out of Food in the Last Year: Never true  Transportation Needs: No Transportation Needs (12/07/2023)   PRAPARE - Administrator, Civil Service (Medical): No    Lack of Transportation (Non-Medical): No  Physical Activity: Inactive (12/07/2023)   Exercise Vital Sign    Days of Exercise per Week: 0 days    Minutes of Exercise per Session: Not on file  Stress: Stress Concern Present (12/07/2023)   [employer] of Occupational Health - Occupational Stress Questionnaire    Feeling of Stress: Rather much  Social Connections: Socially Integrated (12/07/2023)   Social Connection and Isolation Panel    Frequency of Communication with Friends and Family: Three times a week    Frequency of Social Gatherings with Friends and Family: Twice a week    Attends Religious Services: 1 to 4 times per year    Active Member of [employer] or Organizations: Yes    Attends Banker Meetings: 1 to 4 times per year    Marital Status: Married  Catering manager Violence:  Not At Risk (10/18/2023)   Humiliation, Afraid, Rape, and Kick questionnaire    Fear of Current or Ex-Partner: No    Emotionally Abused: No    Physically Abused: No    Sexually Abused: No    Outpatient Medications Prior to Visit  Medication Sig Dispense Refill   acetaminophen  (TYLENOL ) 500 MG tablet Take 1,000 mg by mouth 2 (two) times daily as needed for moderate pain or headache.     acyclovir  (ZOVIRAX ) 400 MG tablet TAKE ONE TABLET BY MOUTH TWICE DAILY 60 tablet 6   ASPIRIN LOW DOSE 81 MG tablet Take 81 mg by mouth daily.     B Complex-Folic Acid  (B COMPLEX VITAMINS, W/ FA,) CAPS Take 1 capsule by mouth daily.     busPIRone  (BUSPAR ) 5 MG tablet Take 5 mg by mouth daily. (Patient not taking: Reported on 11/29/2023)     Calcium Carb-Cholecalciferol 600-10 MG-MCG TABS Take 1 tablet by mouth 2 (two) times daily. (Patient not taking: Reported on 11/29/2023)     Calcium Carbonate-Vitamin D  (CALCIUM 600+D PO) Take 1 tablet by mouth 2 (two) times daily. (Patient not taking: Reported on 11/29/2023)     chlorthalidone (HYGROTON) 25 MG tablet Take 25 mg by mouth daily. Stop taking today per Dr.Katragadda (Patient not taking: Reported on 11/29/2023)     cholecalciferol (VITAMIN D3) 10 MCG (400 UNIT) TABS tablet Take 400 Units by mouth daily.     citalopram  (CELEXA ) 20 MG tablet Take 1 tablet (20 mg total) by mouth daily. 30 tablet 3   cyanocobalamin   (VITAMIN B12) 1000 MCG tablet Take 1,000 mcg by mouth daily.     diclofenac  Sodium (VOLTAREN ) 1 % GEL Apply 4 g topically 4 (four) times daily. 50 g 0   dicyclomine  (BENTYL ) 10 MG capsule TAKE ONE CAPSULE BY MOUTH THREE TIMES A DAY BEFORE meals 90 capsule 2   FEROSUL 325 (65 Fe) MG tablet Take 325 mg by mouth daily.     fluticasone  (FLONASE ) 50 MCG/ACT nasal spray USE 2 SPRAYS IN EACH NOSTRIL ONCE DAILY 16 g 0   GNP VITAMIN C 500 MG tablet Take 500 mg by mouth daily.     lenalidomide  (REVLIMID ) 10 MG capsule Take 1 capsule (10 mg total) by mouth daily. 21 days on, 7 days off every 28 days 21 capsule 0   levocetirizine (XYZAL ) 5 MG tablet TAKE ONE TABLET BY MOUTH IN THE EVENING 30 tablet 3   lidocaine  (LIDODERM ) 5 % Place 1 patch onto the skin daily. Remove & Discard patch within 12 hours or as directed by MD 30 patch 0   magnesium  oxide (MAG-OX) 400 (240 Mg) MG tablet TAKE ONE TABLET BY MOUTH TWICE DAILY 60 tablet 4   metoprolol  tartrate (LOPRESSOR ) 50 MG tablet Take 1 tablet (50 mg total) by mouth 2 (two) times daily. 180 tablet 1   montelukast  (SINGULAIR ) 10 MG tablet TAKE ONE TABLET BY MOUTH AT BEDTIME 30 tablet 3   MYRBETRIQ  50 MG TB24 tablet TAKE ONE TABLET BY MOUTH ONCE DAILY 30 tablet 3   Na Sulfate-K Sulfate-Mg Sulfate concentrate (SUPREP) 17.5-3.13-1.6 GM/177ML SOLN As directed (Patient not taking: Reported on 11/29/2023) 354 mL 0   nitroGLYCERIN (NITROSTAT) 0.4 MG SL tablet Place 0.4 mg under the tongue every 5 (five) minutes as needed for chest pain.     pantoprazole  (PROTONIX ) 40 MG tablet TAKE ONE TABLET BY MOUTH TWICE DAILY 90 tablet 3   potassium chloride  SA (KLOR-CON  M) 20 MEQ tablet Take 1 tablet (20  mEq total) by mouth 2 (two) times daily. 60 tablet 3   pregabalin  (LYRICA ) 50 MG capsule TAKE ONE CAPSULE BY MOUTH TWICE DAILY 60 capsule 1   PREMARIN vaginal cream Place vaginally.     primidone (MYSOLINE) 50 MG tablet TAKE ONE TABLET BY MOUTH TWICE DAILY 60 tablet 5   Probiotic  Product (GNP PROBIOTIC COLON SUPPORT) CAPS Take 1 capsule by mouth daily.     sertraline  (ZOLOFT ) 50 MG tablet Take 50 mg by mouth daily.     simvastatin  (ZOCOR ) 20 MG tablet Take 20 mg by mouth at bedtime.     topiramate (TOPAMAX) 25 MG tablet Take 25 mg by mouth 2 (two) times daily.     traMADol  (ULTRAM ) 50 MG tablet Take 1 tablet (50 mg total) by mouth every 6 (six) hours as needed. 20 tablet 0   warfarin (COUMADIN) 6 MG tablet Take 6 mg by mouth at bedtime.     No facility-administered medications prior to visit.    Allergies  Allergen Reactions   Amoxicillin-Pot Clavulanate Other (See Comments)   Clarithromycin Other (See Comments)    Stomach problems   Erythromycin    Lisinopril Swelling   Amoxicillin Diarrhea    ROS Review of Systems  Constitutional:  Negative for chills and fever.  Eyes:  Negative for visual disturbance.  Respiratory:  Negative for chest tightness and shortness of breath.   Neurological:  Positive for headaches. Negative for dizziness.      Objective:    Physical Exam HENT:     Head: Normocephalic.     Mouth/Throat:     Mouth: Mucous membranes are moist.  Cardiovascular:     Rate and Rhythm: Normal rate.     Heart sounds: Normal heart sounds.  Pulmonary:     Effort: Pulmonary effort is normal.     Breath sounds: Normal breath sounds.  Neurological:     Mental Status: She is alert and oriented to person, place, and time.     BP (!) 99/58   Pulse 71   Wt 131 lb (59.4 kg)   SpO2 96%   BMI 23.21 kg/m  Wt Readings from Last 3 Encounters:  12/11/23 130 lb (59 kg)  12/11/23 131 lb (59.4 kg)  12/07/23 130 lb 8.2 oz (59.2 kg)    Lab Results  Component Value Date   TSH 0.694 02/24/2023   Lab Results  Component Value Date   WBC 7.7 12/07/2023   HGB 10.2 (L) 12/07/2023   HCT 31.6 (L) 12/07/2023   MCV 103.3 (H) 12/07/2023   PLT 236 12/07/2023   Lab Results  Component Value Date   NA 138 12/07/2023   K 3.2 (L) 12/07/2023   CO2 24  12/07/2023   GLUCOSE 98 12/07/2023   BUN 25 (H) 12/07/2023   CREATININE 0.85 12/07/2023   BILITOT 0.4 12/07/2023   ALKPHOS 71 12/07/2023   AST 25 12/07/2023   ALT 42 12/07/2023   PROT 7.6 12/07/2023   ALBUMIN 3.1 (L) 12/07/2023   CALCIUM 8.9 12/07/2023   ANIONGAP 12 12/07/2023   EGFR 71 03/21/2023   Lab Results  Component Value Date   CHOL 146 06/27/2022   Lab Results  Component Value Date   HDL 65 06/27/2022   Lab Results  Component Value Date   LDLCALC 62 06/27/2022   Lab Results  Component Value Date   TRIG 102 06/27/2022   Lab Results  Component Value Date   CHOLHDL 2.2 06/27/2022   Lab Results  Component Value Date   HGBA1C 5.6 02/24/2023      Assessment & Plan:  Fall, initial encounter Assessment & Plan: Encouraged to follow-up in the emergency department for urgent evaluation and imaging, given her neurological symptom of a headache following the fall.   Note: This chart has been completed using Engineer, civil (consulting) software, and while attempts have been made to ensure accuracy, certain words and phrases may not be transcribed as intended.    Follow-up: No follow-ups on file.   Shervin Cypert, FNP

## 2023-12-11 NOTE — Patient Instructions (Addendum)
 I appreciate the opportunity to provide care to you today!     Please follow up in the emergency department for urgent evaluation and imaging, given your neurological symptom of a headache following the fall.    Please continue to a heart-healthy diet and increase your physical activities. Try to exercise for at least five days a week.    It was a pleasure to see you and I look forward to continuing to work together on your health and well-being. Please do not hesitate to call the office if you need care or have questions about your care.  In case of emergency, please visit the Emergency Department for urgent care, or contact our clinic at [phone] to schedule an appointment. We're here to help you!   Have a wonderful day and week. With Gratitude, Bryelle Spiewak MSN, FNP-BC

## 2023-12-12 ENCOUNTER — Other Ambulatory Visit: Payer: Self-pay | Admitting: Hematology

## 2023-12-12 DIAGNOSIS — E876 Hypokalemia: Secondary | ICD-10-CM

## 2023-12-12 DIAGNOSIS — C9000 Multiple myeloma not having achieved remission: Secondary | ICD-10-CM

## 2023-12-14 ENCOUNTER — Ambulatory Visit

## 2023-12-14 ENCOUNTER — Other Ambulatory Visit

## 2023-12-18 ENCOUNTER — Other Ambulatory Visit: Payer: Self-pay

## 2023-12-18 MED ORDER — LENALIDOMIDE 10 MG PO CAPS: ORAL_CAPSULE | ORAL | 0 refills | Status: DC

## 2023-12-18 NOTE — Telephone Encounter (Signed)
 Chart reviewed. Revlimid refilled per last office note with Dr. Ellin Saba.

## 2023-12-20 ENCOUNTER — Inpatient Hospital Stay

## 2023-12-20 ENCOUNTER — Encounter: Payer: Self-pay | Admitting: Hematology

## 2023-12-20 ENCOUNTER — Inpatient Hospital Stay (HOSPITAL_BASED_OUTPATIENT_CLINIC_OR_DEPARTMENT_OTHER): Admitting: Hematology

## 2023-12-20 VITALS — BP 91/62 | HR 71 | Temp 98.0°F | Resp 16 | Wt 129.2 lb

## 2023-12-20 DIAGNOSIS — C9000 Multiple myeloma not having achieved remission: Secondary | ICD-10-CM

## 2023-12-20 DIAGNOSIS — Z5112 Encounter for antineoplastic immunotherapy: Secondary | ICD-10-CM | POA: Diagnosis not present

## 2023-12-20 LAB — COMPREHENSIVE METABOLIC PANEL WITH GFR
ALT: 19 U/L (ref 0–44)
AST: 21 U/L (ref 15–41)
Albumin: 3.5 g/dL (ref 3.5–5.0)
Alkaline Phosphatase: 66 U/L (ref 38–126)
Anion gap: 11 (ref 5–15)
BUN: 25 mg/dL — ABNORMAL HIGH (ref 8–23)
CO2: 25 mmol/L (ref 22–32)
Calcium: 8.6 mg/dL — ABNORMAL LOW (ref 8.9–10.3)
Chloride: 103 mmol/L (ref 98–111)
Creatinine, Ser: 0.89 mg/dL (ref 0.44–1.00)
GFR, Estimated: >60 mL/min (ref >60)
Glucose, Bld: 105 mg/dL — ABNORMAL HIGH (ref 70–99)
Potassium: 3 mmol/L — ABNORMAL LOW (ref 3.5–5.1)
Sodium: 139 mmol/L (ref 135–145)
Total Bilirubin: 0.6 mg/dL (ref 0.0–1.2)
Total Protein: 7.4 g/dL (ref 6.5–8.1)

## 2023-12-20 LAB — CBC WITH DIFFERENTIAL/PLATELET
Abs Immature Granulocytes: 0.02 K/uL (ref 0.00–0.07)
Basophils Absolute: 0 K/uL (ref 0.0–0.1)
Basophils Relative: 0 %
Eosinophils Absolute: 0.3 K/uL (ref 0.0–0.5)
Eosinophils Relative: 6 %
HCT: 32.8 % — ABNORMAL LOW (ref 36.0–46.0)
Hemoglobin: 10.8 g/dL — ABNORMAL LOW (ref 12.0–15.0)
Immature Granulocytes: 1 %
Lymphocytes Relative: 28 %
Lymphs Abs: 1.2 K/uL (ref 0.7–4.0)
MCH: 34.5 pg — ABNORMAL HIGH (ref 26.0–34.0)
MCHC: 32.9 g/dL (ref 30.0–36.0)
MCV: 104.8 fL — ABNORMAL HIGH (ref 80.0–100.0)
Monocytes Absolute: 0.8 K/uL (ref 0.1–1.0)
Monocytes Relative: 20 %
Neutro Abs: 2 K/uL (ref 1.7–7.7)
Neutrophils Relative %: 45 %
Platelets: 130 K/uL — ABNORMAL LOW (ref 150–400)
RBC: 3.13 MIL/uL — ABNORMAL LOW (ref 3.87–5.11)
RDW: 15.4 % (ref 11.5–15.5)
WBC: 4.3 K/uL (ref 4.0–10.5)
nRBC: 0 % (ref 0.0–0.2)

## 2023-12-20 MED ORDER — BORTEZOMIB CHEMO SQ INJECTION 3.5 MG (2.5MG/ML)
0.7000 mg/m2 | Freq: Once | INTRAMUSCULAR | Status: AC
  Administered 2023-12-20: 1.25 mg via SUBCUTANEOUS
  Filled 2023-12-20: qty 0.5

## 2023-12-20 MED ORDER — PROCHLORPERAZINE MALEATE 10 MG PO TABS
10.0000 mg | ORAL_TABLET | Freq: Once | ORAL | Status: AC
  Administered 2023-12-20: 10 mg via ORAL
  Filled 2023-12-20: qty 1

## 2023-12-20 MED ORDER — POTASSIUM CHLORIDE CRYS ER 20 MEQ PO TBCR
40.0000 meq | EXTENDED_RELEASE_TABLET | Freq: Once | ORAL | Status: AC
  Administered 2023-12-20: 40 meq via ORAL
  Filled 2023-12-20: qty 2

## 2023-12-20 NOTE — Progress Notes (Unsigned)
Patient is taking Revlimid as prescribed.  She has not missed any doses and reports no side effects at this time.    Patient has been examined by Dr. Katragadda. Vital signs and labs have been reviewed by MD - ANC, Creatinine, LFTs, hemoglobin, and platelets are within treatment parameters per M.D. - pt may proceed with treatment.  Primary RN and pharmacy notified.  

## 2023-12-20 NOTE — Progress Notes (Signed)
 [facility] [address], [place] [address]    Clinic Day:  12/21/2023  Referring physician: Zarwolo, [name], FNP  Patient Care Team: Zarwolo, [name], FNP as PCP - General (Family Medicine) Rogers Hai, MD as Medical Oncologist (Oncology) Wilson, Diane G, RN as Oncology Nurse Navigator (Oncology) Cindie Carlin POUR, DO as Consulting Physician (Internal Medicine) Frances, Ozell RAMAN, LCSW as [facility] Care Management (Licensed Clinical Social Worker)   ASSESSMENT & PLAN:   Assessment: 1.  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  Increased number of atypical plasma cells present 36% of all cell lines.  Plasma cells are kappa light chain restricted. -Unfortunately her specimen has reached cytogenetics and FISH lab week later due to bad weather and no viable plasma cells. -PET scan on 07/09/2019 showed no findings of active myeloma. -24-hour urine shows total protein 59 mg.  Urine immunofixation shows IgA kappa monoclonal protein.  LDH is 184. -Based on [facility] criteria she has high risk with about 45-50% probability of progression to myeloma in the next 2 years. -CTAP on 01/07/2020 shows acute superior endplate burst fracture of L1.  Mild associated paravertebral soft tissue thickening.  No other bone abnormalities. -Bone density test on 02/07/2020 shows T score -0.5. - Bone marrow biopsy on 07/12/2021: Hypercellular with increased number of atypical plasma cells representing 30% of cells, displaying kappa light chain restriction. - FISH panel: Gain of 1 q. (high risk), T p53 deletion (high risk), monosomy 13 (standard risk) - Cytogenetics: Complex karyotype. - PET scan (06/24/2021): New hypermetabolic bone lesions involving left anterior iliac crest, left mid humerus, right superior pubic ramus, bilateral mid femurs, left calvarium.  Largest lytic lesion  in the left iliac crest measuring 2.9 x 2.3 cm. - 2D echo on 07/29/2021: EF 50-55%.  There is distal septal and inferior apical hypokinesis.  (Carfilzomib based regimen was put on hold due to echo findings) - Dara VRD 6 cycles from 08/10/2021 through 11/30/2021 - Myeloma panel 12/07/2021, DIRA test was negative, M spike 0.2 g and light chain ratio normal. - Maintenance Velcade  every 2 weeks and Revlimid  3 weeks on 1 week off Started on 12/20/2021    2.  Social/family history: - She uses walker to ambulate since her right knee replacement leading to stiffness.  She lives with older sister and husband who has dementia. - She is independent of ADLs and IADLs.  She also drives     Plan: 1.  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  She is receiving Velcade  0.7 mg/m every 2 weeks. - Myeloma labs (11/19/2020): M spike increased to 0.5 g from 0.3 g in May.  FLC ratio 0.82 and stable.  Kappa light chains are elevated at 29.9 from 22 in May. - She has missed Revlimid  in the month of June. - She complained of right hip and buttock pain and back pain since she slipped and fell in February.  She lives at home with her husband who has dementia.  Her sister also lives with her and helps her with day-to-day activities. - As her myeloma labs are worsening, I have recommended adding daratumumab  every 2 weeks to the regimen. - Will also obtain a PET CT scan and follow-up in 2 weeks.  2.  Peripheral neuropathy: - Continue Lyrica  50 mg twice daily which is helping with neuropathic pains.   3.  Pulmonary embolism (2015): - Continue Coumadin indefinitely.  No bleeding issues reported.  4.  ID prophylaxis: - Continue acyclovir  twice daily.  5.  Myeloma bone disease: - Continue denosumab  monthly.  Calcium is 8.6.  Continue calcium supplements.  6.  Right knee  pain: - Continue tramadol  daily as needed.    Orders Placed This Encounter  Procedures   NM PET Image Restag (PS) Skull Base To Thigh    Standing Status:   Future    Expected Date:   12/27/2023    Expiration Date:   12/19/2024    If indicated for the ordered procedure, I authorize the administration of a radiopharmaceutical per Radiology protocol:   Yes    Preferred imaging location?:   [facility]     I,Helena R Teague,acting as a scribe for Alean Stands, MD.,have documented all relevant documentation on the behalf of Alean Stands, MD,as directed by  Alean Stands, MD while in the presence of Alean Stands, MD.  I, Alean Stands MD, have reviewed the above documentation for accuracy and completeness, and I agree with the above.      Alean Stands, MD   7/31/20255:09 PM  CHIEF COMPLAINT:   Diagnosis: multiple myeloma    Cancer Staging  No matching staging information was found for the patient.    Prior Therapy: Dara VRD 6 cycles completed on 11/30/2021   Current Therapy:  Maintenance Velcade  every 2 weeks, and Revlimid  3 weeks on/1 week off    HISTORY OF PRESENT ILLNESS:   Oncology History  IgA myeloma (HCC)  05/06/2021 Initial Diagnosis   IgA myeloma (HCC)   08/10/2021 - 01/19/2022 Chemotherapy   Patient is on Treatment Plan : MYELOMA NEWLY DIAGNOSED TRANSPLANT CANDIDATE DaraVRd (Daratumumab  SQ) q21d x 6 Cycles (Induction/Consolidation)     08/10/2021 -  Chemotherapy   Patient is on Treatment Plan : MYELOMA NEWLY DIAGNOSED TRANSPLANT CANDIDATE Velcade  D1 and D 15 q 28 days        INTERVAL HISTORY:   Courtney Rogers is a 73 y.o. female presenting to clinic today for follow up of multiple myeloma. She was last seen by me on 11/20/2023.  Since her last visit, she was seen in the ED on 12/11/2023 after a fall resulting in a scalp contusion and neck muscle strain.   Today, she states that she is doing well overall. Her appetite level is at  75%. Her energy level is at 75%.   PAST MEDICAL HISTORY:   Past Medical History: Past Medical History:  Diagnosis Date   Acute myocardial infarction [facility]) 2009   CAD/no stent medically managed   Allergy    Anxiety disorder    CAD (coronary artery disease)    Cancer (HCC)    multiple myeloma   CHF (congestive heart failure) (HCC) 959879   Glaucoma [id number]   Hyperlipidemia    Hypertension    IBS (irritable bowel syndrome)    Non-ulcer dyspepsia 04/30/2009   Qualifier: Diagnosis of  By: Harvey MD, Sandi L    Overactive bladder    PE (pulmonary embolism) 10/2012   left lung   Presence of permanent cardiac pacemaker    Sleep apnea     Surgical History: Past Surgical History:  Procedure Laterality Date   ABDOMINAL HYSTERECTOMY     BSO secondary to cyst     CARDIAC CATHETERIZATION     CATARACT EXTRACTION W/PHACO  Left 11/16/2023   Procedure: PHACOEMULSIFICATION, CATARACT, WITH IOL INSERTION 5.45, 00:43.2;  Surgeon: Enola Feliciano Hugger, MD;  Location: [facility];  Service: Ophthalmology;  Laterality: Left;   COLONOSCOPY  12/2009   Dr. Rhoda, propofol , normal. Next TCS 12/2019   COLONOSCOPY N/A 05/22/2017   Examined portion ileum was normal, significant looping of colon, external hemorrhoids and rectal bleeding due to internal hemorrhoids, mild diverticulosis procedure: COLONOSCOPY;  Surgeon: Harvey Margo CROME, MD;  [facility];  Service: Endoscopy;  Laterality: N/A;  10:30am   COLONOSCOPY N/A 11/03/2023   Procedure: COLONOSCOPY;  Surgeon: Cindie Carlin POUR, DO;  Location: [facility];  Service: Endoscopy;  Laterality: N/A;  1:00 pm, asa 3   ESOPHAGOGASTRODUODENOSCOPY  05/11/2009   schatzki ring/small hiatal hernia/path:gastritis   ESOPHAGOGASTRODUODENOSCOPY N/A 05/22/2017   Multiple gastric polyps with biopsy benign fundic gland polyp, small bowel biopsy negative for celiac, gastric biopsy with mild gastritis but no H. pylori procedure:  ESOPHAGOGASTRODUODENOSCOPY (EGD);  Surgeon: Harvey Margo CROME, MD;  [facility]: [facility];  Service: Endoscopy;  Laterality: N/A;   ESOPHAGOGASTRODUODENOSCOPY N/A 11/03/2023   Procedure: EGD (ESOPHAGOGASTRODUODENOSCOPY);  Surgeon: Cindie Carlin POUR, DO;  [facility]: [facility];  Service: Endoscopy;  Laterality: N/A;   ESOPHAGOGASTRODUODENOSCOPY (EGD) WITH ESOPHAGEAL DILATION N/A 04/01/2013   DOQ:dumprulmz at the gastroesophageal juction/multiple small polyps/mild gastritis   GIVENS CAPSULE STUDY N/A 06/07/2017   Frequent gastric erosions, normal small bowel mucosa.  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    Social History: Social History   Socioeconomic History   Marital status: Married    Spouse name: Doctor, general practice   Number of children: 3   Years of education: 16   Highest education level: Master's degree (e.g., MA, MS, MEng, MEd, MSW, MBA)  Occupational History   Not on file  Tobacco Use   Smoking status: Never    Passive exposure: Never   Smokeless tobacco: Never   Tobacco comments:    Never smoked   Vaping Use   Vaping status: Never Used  Substance and Sexual Activity   Alcohol use: Not Currently    Comment: occ wine   Drug use: No   Sexual activity: Not Currently  Other Topics Concern   Not on file  Social History Narrative      Lives with husband-51 years 950 in Aug 2021   Daughter is close by, 2 sons live further away   One IN [place],ONE IN [place], ONE BESIDE HER.        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   Enjoys: reading, young adult      Diet: eats all food groups    Caffeine: coffee 1, tea daily soda-1 daily   Water : 1-2 cups      Wears seat belt    Does not use phone while driving   Smoke detectors at  home    Licensed conveyancer -locked up      Left handed   One story home   Drinks caffeine   Social Drivers of Health   Financial Resource Strain: Low Risk  (12/07/2023)   Overall Financial Resource Strain (CARDIA)    Difficulty of Paying Living Expenses: Not hard at all  Food Insecurity: No Food Insecurity (12/07/2023)   Hunger Vital Sign    Worried About Running Out of Food in the Last Year: Never true    Ran Out of Food in the Last Year: Never true  Transportation Needs: No Transportation Needs (12/07/2023)   PRAPARE - Administrator, Civil Service (Medical): No    Lack of Transportation (Non-Medical): No  Physical Activity: Inactive (12/07/2023)   Exercise Vital Sign    Days of Exercise per Week: 0 days    Minutes of Exercise per Session: Not on file  Stress: Stress Concern Present (12/07/2023)   [employer] of Occupational Health - Occupational Stress Questionnaire    Feeling of Stress: Rather much  Social Connections: Socially Integrated (12/07/2023)   Social Connection and Isolation Panel    Frequency of Communication with Friends and Family: Three times a week    Frequency of Social Gatherings with Friends and Family: Twice a week    Attends Religious Services: 1 to 4 times per year    Active Member of [employer] or Organizations: Yes    Attends Banker Meetings: 1 to 4 times per year    Marital Status: Married  Catering manager Violence: Not At Risk (10/18/2023)   Humiliation, Afraid, Rape, and Kick questionnaire    Fear of Current or Ex-Partner: No    Emotionally Abused: No    Physically Abused: No    Sexually Abused: No    Family History: Family History  Problem Relation Age of Onset   Anxiety disorder Mother    Cancer Mother    Heart disease Father    Hypertension Father    Colon polyps Neg Hx    Colon cancer Neg Hx     Current Medications:  Current Outpatient Medications:    acetaminophen  (TYLENOL ) 500 MG tablet, Take  1,000 mg by mouth 2 (two) times daily as needed for moderate pain or headache., Disp: , Rfl:    acyclovir  (ZOVIRAX ) 400 MG tablet, TAKE ONE TABLET BY MOUTH TWICE DAILY, Disp: 60 tablet, Rfl: 6   ASPIRIN LOW DOSE 81 MG tablet, Take 81 mg by mouth daily., Disp: , Rfl:    B Complex-Folic Acid  (B COMPLEX VITAMINS, W/ FA,) CAPS, Take 1 capsule by mouth daily., Disp: , Rfl:    busPIRone  (BUSPAR ) 5 MG tablet, Take 5 mg by mouth daily., Disp: , Rfl:    Calcium Carb-Cholecalciferol 600-10 MG-MCG TABS, Take 1 tablet by mouth 2 (two) times daily., Disp: , Rfl:    Calcium Carbonate-Vitamin D  (CALCIUM 600+D PO), Take 1 tablet by mouth 2 (two) times daily., Disp: , Rfl:    chlorthalidone (HYGROTON) 25 MG tablet, Take 25 mg by mouth daily. Stop taking today per Dr.Chao Blazejewski, Disp: , Rfl:    cholecalciferol (VITAMIN D3) 10 MCG (400 UNIT) TABS tablet, Take 400 Units by mouth daily., Disp: , Rfl:    citalopram  (CELEXA ) 20 MG tablet, Take 1 tablet (20 mg total) by mouth daily., Disp: 30 tablet, Rfl: 3   cyanocobalamin  (VITAMIN B12) 1000 MCG tablet, Take 1,000 mcg by mouth daily., Disp: , Rfl:    diclofenac  Sodium (VOLTAREN ) 1 % GEL, Apply 4 g topically 4 (four) times daily., Disp: 50 g, Rfl: 0   dicyclomine  (BENTYL ) 10 MG capsule, TAKE ONE CAPSULE BY MOUTH THREE TIMES A DAY BEFORE meals, Disp: 90 capsule, Rfl: 2   FEROSUL 325 (65 Fe) MG tablet, Take 325 mg by mouth daily., Disp: , Rfl:    fluticasone  (FLONASE )  50 MCG/ACT nasal spray, USE 2 SPRAYS IN EACH NOSTRIL ONCE DAILY, Disp: 16 g, Rfl: 0   folic acid  (FOLVITE ) 1 MG tablet, Take 1 mg by mouth daily., Disp: , Rfl:    GNP VITAMIN C 500 MG tablet, Take 500 mg by mouth daily., Disp: , Rfl:    lenalidomide  (REVLIMID ) 10 MG capsule, Take 1 capsule (10 mg total) by mouth daily. 21 days on, 7 days off every 28 days, Disp: 21 capsule, Rfl: 0   levocetirizine (XYZAL ) 5 MG tablet, TAKE ONE TABLET BY MOUTH IN THE EVENING, Disp: 30 tablet, Rfl: 3   lidocaine  (LIDODERM ) 5 %,  Place 1 patch onto the skin daily. Remove & Discard patch within 12 hours or as directed by MD, Disp: 30 patch, Rfl: 0   magnesium  oxide (MAG-OX) 400 (240 Mg) MG tablet, TAKE ONE TABLET BY MOUTH TWICE DAILY, Disp: 60 tablet, Rfl: 4   metoprolol  tartrate (LOPRESSOR ) 50 MG tablet, Take 1 tablet (50 mg total) by mouth 2 (two) times daily., Disp: 180 tablet, Rfl: 1   montelukast  (SINGULAIR ) 10 MG tablet, TAKE ONE TABLET BY MOUTH AT BEDTIME, Disp: 30 tablet, Rfl: 3   MYRBETRIQ  50 MG TB24 tablet, TAKE ONE TABLET BY MOUTH ONCE DAILY, Disp: 30 tablet, Rfl: 3   NICODERM CQ 14 MG/24HR patch, 14 mg daily., Disp: , Rfl:    nitroGLYCERIN (NITROSTAT) 0.4 MG SL tablet, Place 0.4 mg under the tongue every 5 (five) minutes as needed for chest pain., Disp: , Rfl:    pantoprazole  (PROTONIX ) 40 MG tablet, TAKE ONE TABLET BY MOUTH TWICE DAILY, Disp: 90 tablet, Rfl: 3   potassium chloride  SA (KLOR-CON  M) 20 MEQ tablet, TAKE ONE TABLET BY MOUTH TWICE DAILY, Disp: 60 tablet, Rfl: 3   pregabalin  (LYRICA ) 50 MG capsule, TAKE ONE CAPSULE BY MOUTH TWICE DAILY, Disp: 60 capsule, Rfl: 1   primidone (MYSOLINE) 50 MG tablet, TAKE ONE TABLET BY MOUTH TWICE DAILY, Disp: 60 tablet, Rfl: 5   Probiotic Product (GNP PROBIOTIC COLON SUPPORT) CAPS, Take 1 capsule by mouth daily., Disp: , Rfl:    sertraline  (ZOLOFT ) 50 MG tablet, Take 50 mg by mouth daily., Disp: , Rfl:    simvastatin  (ZOCOR ) 20 MG tablet, Take 20 mg by mouth at bedtime., Disp: , Rfl:    topiramate (TOPAMAX) 25 MG tablet, Take 25 mg by mouth 2 (two) times daily., Disp: , Rfl:    traMADol  (ULTRAM ) 50 MG tablet, Take 1 tablet (50 mg total) by mouth every 6 (six) hours as needed., Disp: 20 tablet, Rfl: 0   warfarin (COUMADIN) 3 MG tablet, Take 3 mg by mouth daily., Disp: , Rfl:    warfarin (COUMADIN) 6 MG tablet, Take 6 mg by mouth at bedtime., Disp: , Rfl:    Na Sulfate-K Sulfate-Mg Sulfate concentrate (SUPREP) 17.5-3.13-1.6 GM/177ML SOLN, As directed (Patient not taking:  Reported on 11/29/2023), Disp: 354 mL, Rfl: 0   Allergies: Allergies  Allergen Reactions   Amoxicillin-Pot Clavulanate Other (See Comments)   Clarithromycin Other (See Comments)    Stomach problems   Erythromycin    Lisinopril Swelling   Amoxicillin Diarrhea    REVIEW OF SYSTEMS:   Review of Systems  Constitutional:  Negative for chills, fatigue and fever.  HENT:   Negative for lump/mass, mouth sores, nosebleeds, sore throat and trouble swallowing.   Eyes:  Negative for eye problems.  Respiratory:  Negative for cough and shortness of breath.   Cardiovascular:  Negative for chest pain, leg swelling and  palpitations.  Gastrointestinal:  Negative for abdominal pain, constipation, diarrhea, nausea and vomiting.  Genitourinary:  Negative for bladder incontinence, difficulty urinating, dysuria, frequency, hematuria and nocturia.   Musculoskeletal:  Positive for back pain. Negative for arthralgias, flank pain, myalgias and neck pain.  Skin:  Negative for itching and rash.  Neurological:  Negative for dizziness, headaches and numbness.  Hematological:  Does not bruise/bleed easily.  Psychiatric/Behavioral:  Positive for sleep disturbance. Negative for depression and suicidal ideas. The patient is not nervous/anxious.   All other systems reviewed and are negative.    VITALS:   Blood pressure 91/62, pulse 71, temperature 98 F (36.7 C), temperature source Oral, resp. rate 16, weight 129 lb 3 oz (58.6 kg), SpO2 100%.  Wt Readings from Last 3 Encounters:  12/20/23 129 lb 3 oz (58.6 kg)  12/11/23 130 lb (59 kg)  12/11/23 131 lb (59.4 kg)    Body mass index is 22.18 kg/m.  Performance status (ECOG): 1 - Symptomatic but completely ambulatory  PHYSICAL EXAM:   Physical Exam Vitals and nursing note reviewed. Exam conducted with a chaperone present.  Constitutional:      Appearance: Normal appearance.  Cardiovascular:     Rate and Rhythm: Normal rate and regular rhythm.     Pulses:  Normal pulses.     Heart sounds: Normal heart sounds.  Pulmonary:     Effort: Pulmonary effort is normal.     Breath sounds: Normal breath sounds.  Abdominal:     Palpations: Abdomen is soft. There is no hepatomegaly, splenomegaly or mass.     Tenderness: There is no abdominal tenderness.  Musculoskeletal:     Right lower leg: No edema.     Left lower leg: No edema.  Lymphadenopathy:     Cervical: No cervical adenopathy.     Right cervical: No superficial, deep or posterior cervical adenopathy.    Left cervical: No superficial, deep or posterior cervical adenopathy.     Upper Body:     Right upper body: No supraclavicular or axillary adenopathy.     Left upper body: No supraclavicular or axillary adenopathy.  Neurological:     General: No focal deficit present.     Mental Status: She is alert and oriented to person, place, and time.  Psychiatric:        Mood and Affect: Mood normal.        Behavior: Behavior normal.     LABS:      Latest Ref Rng & Units 12/20/2023   12:46 PM 12/11/2023    5:59 PM 12/07/2023   11:42 AM  CBC  WBC 4.0 - 10.5 K/uL 4.3  5.2  7.7   Hemoglobin 12.0 - 15.0 g/dL 89.1  89.8  89.7   Hematocrit 36.0 - 46.0 % 32.8  31.1  31.6   Platelets 150 - 400 K/uL 130  176  236       Latest Ref Rng & Units 12/20/2023   12:46 PM 12/11/2023    5:59 PM 12/07/2023   11:42 AM  CMP  Glucose 70 - 99 mg/dL 894  82  98   BUN 8 - 23 mg/dL 25  21  25    Creatinine 0.44 - 1.00 mg/dL 9.10  9.01  9.14   Sodium 135 - 145 mmol/L 139  136  138   Potassium 3.5 - 5.1 mmol/L 3.0  2.9  3.2   Chloride 98 - 111 mmol/L 103  100  102   CO2 22 - 32  mmol/L 25  24  24    Calcium 8.9 - 10.3 mg/dL 8.6  8.1  8.9   Total Protein 6.5 - 8.1 g/dL 7.4  7.2  7.6   Total Bilirubin 0.0 - 1.2 mg/dL 0.6  0.3  0.4   Alkaline Phos 38 - 126 U/L 66  62  71   AST 15 - 41 U/L 21  19  25    ALT 0 - 44 U/L 19  26  42      No results found for: CEA1, CEA / No results found for: CEA1, CEA No  results found for: PSA1 No results found for: CAN199 No results found for: RJW874  Lab Results  Component Value Date   TOTALPROTELP 6.8 11/20/2023   TOTALPROTELP 6.7 11/20/2023   ALBUMINELP 3.3 11/20/2023   A1GS 0.3 11/20/2023   A2GS 0.8 11/20/2023   BETS 1.1 11/20/2023   GAMS 1.2 11/20/2023   MSPIKE 0.5 (H) 11/20/2023   SPEI Comment 11/20/2023   Lab Results  Component Value Date   TIBC 338 11/20/2023   TIBC 331 11/01/2023   TIBC 291 06/09/2021   FERRITIN 140 11/20/2023   FERRITIN 103 11/01/2023   FERRITIN 279 06/09/2021   IRONPCTSAT 17 11/20/2023   IRONPCTSAT 20 11/01/2023   IRONPCTSAT 24 06/09/2021   Lab Results  Component Value Date   LDH 129 03/02/2022   LDH 121 12/14/2021   LDH 113 11/09/2021     STUDIES:   CT Cervical Spine Wo Contrast Result Date: 12/11/2023 CLINICAL DATA:  Provided history: Ataxia, cervical trauma Fall last night in the kitchen.  Struck head.  Headache today. EXAM: CT CERVICAL SPINE WITHOUT CONTRAST TECHNIQUE: Multidetector CT imaging of the cervical spine was performed without intravenous contrast. Multiplanar CT image reconstructions were also generated. RADIATION DOSE REDUCTION: This exam was performed according to the departmental dose-optimization program which includes automated exposure control, adjustment of the mA and/or kV according to patient size and/or use of iterative reconstruction technique. COMPARISON:  None Available. FINDINGS: Alignment: Normal. Skull base and vertebrae: No acute fracture. Vertebral body heights are maintained. The dens and skull base are intact. Soft tissues and spinal canal: No prevertebral fluid or swelling. No visible canal hematoma. Disc levels: Degenerative pannus at C1-C2. Anterior spurring throughout, with mild disc space narrowing C5-C6 and C6-C7. Upper chest: No acute findings. Other: None. 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 Electronically  Signed   By: Andrea Gasman M.D.   On: 12/11/2023 17:49   CT Head Wo Contrast Result Date: 12/11/2023 CLINICAL DATA:  Fall last night in the kitchen hitting head. Headache. EXAM: CT HEAD WITHOUT CONTRAST TECHNIQUE: Contiguous axial images were obtained from the base of the skull through the vertex without intravenous contrast. RADIATION DOSE REDUCTION: This exam was performed according to the departmental dose-optimization program which includes automated exposure control, adjustment of the mA and/or kV according to patient size and/or use of iterative reconstruction technique. COMPARISON:  Head CT 01/15/2023 FINDINGS: Brain: No intracranial hemorrhage, mass effect, or midline shift. Normal for age atrophy. No hydrocephalus. The basilar cisterns are patent. No evidence of territorial infarct or acute ischemia. Unchanged partially empty sella no extra-axial or intracranial fluid collection. Vascular: No hyperdense vessel or unexpected calcification. Skull: No fracture or focal lesion. Sinuses/Orbits: No acute findings. Other: Suspected left parietal scalp hematoma. IMPRESSION: No acute intracranial abnormality. No skull fracture. Electronically Signed   By: Andrea Gasman M.D.   On: 12/11/2023 17:45

## 2023-12-20 NOTE — Progress Notes (Signed)
 Patient presents today for Velcade  infusion. Patient is in satisfactory condition with no new complaints voiced.  Vital signs are stable.  Labs reviewed by Dr. Rogers during the office visit and all labs are within treatment parameters. Patient will receive 40 mEq potassium chloride  p.o x 1 dose per Dr.Katragadda's standing orders.  We will proceed with treatment per MD orders.   Treatment given today per MD orders. Tolerated infusion without adverse affects. Vital signs stable. No complaints at this time. Discharged from clinic via wheelchair in stable condition. Alert and oriented x 3. F/U with [facility] as scheduled.

## 2023-12-20 NOTE — Patient Instructions (Signed)
 [facility] [place] - A DEPT OF [facility]  Discharge Instructions: Thank you for choosing [facility] to provide your oncology and hematology care.  If you have a lab appointment with the Cancer Center - please note that after April 8th, 2024, all labs will be drawn in the cancer center.  You do not have to check in or register with the main entrance as you have in the past but will complete your check-in in the cancer center.  Wear comfortable clothing and clothing appropriate for easy access to any Portacath or PICC line.   We strive to give you quality time with your provider. You may need to reschedule your appointment if you arrive late (15 or more minutes).  Arriving late affects you and other patients whose appointments are after yours.  Also, if you miss three or more appointments without notifying the office, you may be dismissed from the clinic at the provider's discretion.      For prescription refill requests, have your pharmacy contact our office and allow 72 hours for refills to be completed.    Today you received the following chemotherapy and/or immunotherapy agents Velcade   To help prevent nausea and vomiting after your treatment, we encourage you to take your nausea medication as directed.  Bortezomib Injection What is this medication? BORTEZOMIB (bor TEZ oh mib) treats lymphoma. It may also be used to treat multiple myeloma, a type of bone marrow cancer. It works by blocking a protein that causes cancer cells to grow and multiply. This helps to slow or stop the spread of cancer cells. This medicine may be used for other purposes; ask your health care provider or pharmacist if you have questions. COMMON BRAND NAME(S): BORUZU, Velcade What should I tell my care team before I take this medication? They need to know if you have any of these conditions: Dehydration Diabetes Heart disease Liver disease Tingling of the fingers or toes or  other nerve disorder An unusual or allergic reaction to bortezomib, other medications, foods, dyes, or preservatives If you or your partner are pregnant or trying to get pregnant Breastfeeding How should I use this medication? This medication is injected into a vein or under the skin. It is given by your care team in a hospital or clinic setting. Talk to your care team about the use of this medication in children. Special care may be needed. Overdosage: If you think you have taken too much of this medicine contact a poison control center or emergency room at once. NOTE: This medicine is only for you. Do not share this medicine with others. What if I miss a dose? Keep appointments for follow-up doses. It is important not to miss your dose. Call your care team if you are unable to keep an appointment. What may interact with this medication? Ketoconazole Rifampin This list may not describe all possible interactions. Give your health care provider a list of all the medicines, herbs, non-prescription drugs, or dietary supplements you use. Also tell them if you smoke, drink alcohol, or use illegal drugs. Some items may interact with your medicine. What should I watch for while using this medication? Your condition will be monitored carefully while you are receiving this medication. You may need blood work while taking this medication. This medication may affect your coordination, reaction time, or judgment. Do not drive or operate machinery until you know how this medication affects you. Sit up or stand slowly to  reduce the risk of dizzy or fainting spells. Drinking alcohol with this medication can increase the risk of these side effects. This medication may increase your risk of getting an infection. Call your care team for advice if you get a fever, chills, sore throat, or other symptoms of a cold or flu. Do not treat yourself. Try to avoid being around people who are sick. Check with your care team  if you have severe diarrhea, nausea, and vomiting, or if you sweat a lot. The loss of too much body fluid may make it dangerous for you to take this medication. Talk to your care team if you may be pregnant. Serious birth defects can occur if you take this medication during pregnancy and for 7 months after the last dose. You will need a negative pregnancy test before starting this medication. Contraception is recommended while taking this medication and for 7 months after the last dose. Your care team can help you find the option that works for you. If your partner can get pregnant, use a condom during sex while taking this medication and for 4 months after the last dose. Do not breastfeed while taking this medication and for 2 months after the last dose. This medication may cause infertility. Talk to your care team if you are concerned about your fertility. What side effects may I notice from receiving this medication? Side effects that you should report to your care team as soon as possible: Allergic reactions--skin rash, itching, hives, swelling of the face, lips, tongue, or throat Bleeding--bloody or black, tar-like stools, vomiting blood or brown material that looks like coffee grounds, red or dark brown urine, small red or purple spots on skin, unusual bruising or bleeding Bleeding in the brain--severe headache, stiff neck, confusion, dizziness, change in vision, numbness or weakness of the face, arm, or leg, trouble speaking, trouble walking, vomiting Bowel blockage--stomach cramping, unable to have a bowel movement or pass gas, loss of appetite, vomiting Heart failure--shortness of breath, swelling of the ankles, feet, or hands, sudden weight gain, unusual weakness or fatigue Infection--fever, chills, cough, sore throat, wounds that don't heal, pain or trouble when passing urine, general feeling of discomfort or being unwell Liver injury--right upper belly pain, loss of appetite, nausea,  light-colored stool, dark yellow or brown urine, yellowing skin or eyes, unusual weakness or fatigue Low blood pressure--dizziness, feeling faint or lightheaded, blurry vision Lung injury--shortness of breath or trouble breathing, cough, spitting up blood, chest pain, fever Pain, tingling, or numbness in the hands or feet Severe or prolonged diarrhea Stomach pain, bloody diarrhea, pale skin, unusual weakness or fatigue, decrease in the amount of urine, which may be signs of hemolytic uremic syndrome Sudden and severe headache, confusion, change in vision, seizures, which may be signs of posterior reversible encephalopathy syndrome (PRES) TTP--purple spots on the skin or inside the mouth, pale skin, yellowing skin or eyes, unusual weakness or fatigue, fever, fast or irregular heartbeat, confusion, change in vision, trouble speaking, trouble walking Tumor lysis syndrome (TLS)--nausea, vomiting, diarrhea, decrease in the amount of urine, dark urine, unusual weakness or fatigue, confusion, muscle pain or cramps, fast or irregular heartbeat, joint pain Side effects that usually do not require medical attention (report to your care team if they continue or are bothersome): Constipation Diarrhea Fatigue Loss of appetite Nausea This list may not describe all possible side effects. Call your doctor for medical advice about side effects. You may report side effects to FDA at 1-800-FDA-1088. Where should I keep  my medication? This medication is given in a hospital or clinic. It will not be stored at home. NOTE: This sheet is a summary. It may not cover all possible information. If you have questions about this medicine, talk to your doctor, pharmacist, or health care provider.  2024 Elsevier/Gold Standard (2021-10-12 00:00:00)  BELOW ARE SYMPTOMS THAT SHOULD BE REPORTED IMMEDIATELY: *FEVER GREATER THAN 100.4 F (38 C) OR HIGHER *CHILLS OR SWEATING *NAUSEA AND VOMITING THAT IS NOT CONTROLLED WITH YOUR  NAUSEA MEDICATION *UNUSUAL SHORTNESS OF BREATH *UNUSUAL BRUISING OR BLEEDING *URINARY PROBLEMS (pain or burning when urinating, or frequent urination) *BOWEL PROBLEMS (unusual diarrhea, constipation, pain near the anus) TENDERNESS IN MOUTH AND THROAT WITH OR WITHOUT PRESENCE OF ULCERS (sore throat, sores in mouth, or a toothache) UNUSUAL RASH, SWELLING OR PAIN  UNUSUAL VAGINAL DISCHARGE OR ITCHING   Items with * indicate a potential emergency and should be followed up as soon as possible or go to the Emergency Department if any problems should occur.  Please show the CHEMOTHERAPY ALERT CARD or IMMUNOTHERAPY ALERT CARD at check-in to the Emergency Department and triage nurse.  Should you have questions after your visit or need to cancel or reschedule your appointment, please contact [facility] [place] - A DEPT OF [facility] [phone]  and follow the prompts.  Office hours are 8:00 a.m. to 4:30 p.m. Monday - Friday. Please note that voicemails left after 4:00 p.m. may not be returned until the following business day.  We are closed weekends and major holidays. You have access to a nurse at all times for urgent questions. Please call the main number to the clinic [phone] and follow the prompts.  For any non-urgent questions, you may also contact your provider using MyChart. We now offer e-Visits for anyone 40 and older to request care online for non-urgent symptoms. For details visit [email or web address].   Also download the MyChart app! Go to the app store, search "MyChart", open the app, select [place], and log in with your MyChart username and password.

## 2023-12-20 NOTE — Patient Instructions (Signed)

## 2023-12-20 NOTE — Progress Notes (Signed)
 Kcl 40mEq PO x1 ordered per protocol for K=3.0.  Avyon Herendeen, PharmD, MBA

## 2023-12-21 ENCOUNTER — Encounter (HOSPITAL_COMMUNITY): Payer: Self-pay | Admitting: Hematology

## 2023-12-21 ENCOUNTER — Encounter: Payer: Self-pay | Admitting: Hematology

## 2023-12-21 NOTE — Progress Notes (Deleted)
 Name: Courtney Rogers DOB: 11-09-1950 MRN: [id number]  History of Present Illness: Courtney Rogers is a 73 y.o. female who presents today for follow up visit at [facility]. She is accompanied by her daughter Gillie, who assists with providing history.  Relevant History includes: 2. OAB with frequency, urgency, and urge incontinence.   3. Stress urinary incontinence. Infrequent, not significantly bothersome per patient. 3. Kidney stones. - One 5 mm right kidney stone found incidentally on CT 08/12/2023. - She denies prior history of kidney stones or prior stone procedure(s). 4. Right renal cyst. 5. Pelvic organ prolapse (cystocele). 6. Vaginal atrophy. 7. Voiding dysfunction with incomplete bladder emptying.   At last visit on 11/29/2023: - Passed voiding trial. Foley catheter discontinued.  - She is scheduled to see GYN next Wednesday 12/06/2023 for her pelvic organ prolapse; encouraged her to follow through with their recommendations. - The plan was for follow up in 3-4 weeks for recheck including PVR.   Since last visit: ***did she see GYN?  ***records request?  Today: She reports ***  She {Actions; denies-reports:120008} urinary urgency, frequency, nocturia x***, dysuria, gross hematuria, ***weak urinary stream, hesitancy, straining to void, or sensations of incomplete emptying.  She {Actions; denies-reports:120008} flank pain or abdominal pain. She {Actions; denies-reports:120008} fevers, nausea, or vomiting.   Medications: Current Outpatient Medications  Medication Sig Dispense Refill   acetaminophen  (TYLENOL ) 500 MG tablet Take 1,000 mg by mouth 2 (two) times daily as needed for moderate pain or headache.     acyclovir  (ZOVIRAX ) 400 MG tablet TAKE ONE TABLET BY MOUTH TWICE DAILY 60 tablet 6   ASPIRIN LOW DOSE 81 MG tablet Take 81 mg by mouth daily.     B Complex-Folic Acid  (B COMPLEX VITAMINS, W/ FA,) CAPS Take 1 capsule by mouth daily.      busPIRone  (BUSPAR ) 5 MG tablet Take 5 mg by mouth daily.     Calcium Carb-Cholecalciferol 600-10 MG-MCG TABS Take 1 tablet by mouth 2 (two) times daily.     Calcium Carbonate-Vitamin D  (CALCIUM 600+D PO) Take 1 tablet by mouth 2 (two) times daily.     chlorthalidone (HYGROTON) 25 MG tablet Take 25 mg by mouth daily. Stop taking today per Dr.Katragadda     cholecalciferol (VITAMIN D3) 10 MCG (400 UNIT) TABS tablet Take 400 Units by mouth daily.     citalopram  (CELEXA ) 20 MG tablet Take 1 tablet (20 mg total) by mouth daily. 30 tablet 3   cyanocobalamin  (VITAMIN B12) 1000 MCG tablet Take 1,000 mcg by mouth daily.     diclofenac  Sodium (VOLTAREN ) 1 % GEL Apply 4 g topically 4 (four) times daily. 50 g 0   dicyclomine  (BENTYL ) 10 MG capsule TAKE ONE CAPSULE BY MOUTH THREE TIMES A DAY BEFORE meals 90 capsule 2   FEROSUL 325 (65 Fe) MG tablet Take 325 mg by mouth daily.     fluticasone  (FLONASE ) 50 MCG/ACT nasal spray USE 2 SPRAYS IN EACH NOSTRIL ONCE DAILY 16 g 0   folic acid  (FOLVITE ) 1 MG tablet Take 1 mg by mouth daily.     GNP VITAMIN C 500 MG tablet Take 500 mg by mouth daily.     lenalidomide  (REVLIMID ) 10 MG capsule Take 1 capsule (10 mg total) by mouth daily. 21 days on, 7 days off every 28 days 21 capsule 0   levocetirizine (XYZAL ) 5 MG tablet TAKE ONE TABLET BY MOUTH IN THE EVENING 30 tablet 3   lidocaine  (LIDODERM ) 5 % Place 1  patch onto the skin daily. Remove & Discard patch within 12 hours or as directed by MD 30 patch 0   magnesium  oxide (MAG-OX) 400 (240 Mg) MG tablet TAKE ONE TABLET BY MOUTH TWICE DAILY 60 tablet 4   metoprolol  tartrate (LOPRESSOR ) 50 MG tablet Take 1 tablet (50 mg total) by mouth 2 (two) times daily. 180 tablet 1   montelukast  (SINGULAIR ) 10 MG tablet TAKE ONE TABLET BY MOUTH AT BEDTIME 30 tablet 3   MYRBETRIQ  50 MG TB24 tablet TAKE ONE TABLET BY MOUTH ONCE DAILY 30 tablet 3   Na Sulfate-K Sulfate-Mg Sulfate concentrate (SUPREP) 17.5-3.13-1.6 GM/177ML SOLN As directed  (Patient not taking: Reported on 11/29/2023) 354 mL 0   NICODERM CQ 14 MG/24HR patch 14 mg daily.     nitroGLYCERIN (NITROSTAT) 0.4 MG SL tablet Place 0.4 mg under the tongue every 5 (five) minutes as needed for chest pain.     pantoprazole  (PROTONIX ) 40 MG tablet TAKE ONE TABLET BY MOUTH TWICE DAILY 90 tablet 3   potassium chloride  SA (KLOR-CON  M) 20 MEQ tablet TAKE ONE TABLET BY MOUTH TWICE DAILY 60 tablet 3   pregabalin  (LYRICA ) 50 MG capsule TAKE ONE CAPSULE BY MOUTH TWICE DAILY 60 capsule 1   primidone (MYSOLINE) 50 MG tablet TAKE ONE TABLET BY MOUTH TWICE DAILY 60 tablet 5   Probiotic Product (GNP PROBIOTIC COLON SUPPORT) CAPS Take 1 capsule by mouth daily.     sertraline  (ZOLOFT ) 50 MG tablet Take 50 mg by mouth daily.     simvastatin  (ZOCOR ) 20 MG tablet Take 20 mg by mouth at bedtime.     topiramate (TOPAMAX) 25 MG tablet Take 25 mg by mouth 2 (two) times daily.     traMADol  (ULTRAM ) 50 MG tablet Take 1 tablet (50 mg total) by mouth every 6 (six) hours as needed. 20 tablet 0   warfarin (COUMADIN) 3 MG tablet Take 3 mg by mouth daily.     warfarin (COUMADIN) 6 MG tablet Take 6 mg by mouth at bedtime.     No current facility-administered medications for this visit.    Allergies: Allergies  Allergen Reactions   Amoxicillin-Pot Clavulanate Other (See Comments)   Clarithromycin Other (See Comments)    Stomach problems   Erythromycin    Lisinopril Swelling   Amoxicillin Diarrhea    Past Medical History:  Diagnosis Date   Acute myocardial infarction [facility]) 2009   CAD/no stent medically managed   Allergy    Anxiety disorder    CAD (coronary artery disease)    Cancer (HCC)    multiple myeloma   CHF (congestive heart failure) (HCC) 959879   Glaucoma 967974   Hyperlipidemia    Hypertension    IBS (irritable bowel syndrome)    Non-ulcer dyspepsia 04/30/2009   Qualifier: Diagnosis of  By: Harvey MD, Sandi L    Overactive bladder    PE (pulmonary embolism) 10/2012   left lung    Presence of permanent cardiac pacemaker    Sleep apnea    Past Surgical History:  Procedure Laterality Date   ABDOMINAL HYSTERECTOMY     BSO secondary to cyst     CARDIAC CATHETERIZATION     CATARACT EXTRACTION W/PHACO Left 11/16/2023   Procedure: PHACOEMULSIFICATION, CATARACT, WITH IOL INSERTION 5.45, 00:43.2;  Surgeon: Enola Feliciano Hugger, MD;  Location: [facility];  Service: Ophthalmology;  Laterality: Left;   COLONOSCOPY  12/2009   Dr. Rhoda, propofol , normal. Next TCS 12/2019   COLONOSCOPY N/A 05/22/2017  Examined portion ileum was normal, significant looping of colon, external hemorrhoids and rectal bleeding due to internal hemorrhoids, mild diverticulosis procedure: COLONOSCOPY;  Surgeon: Harvey Margo CROME, MD;  [facility];  Service: Endoscopy;  Laterality: N/A;  10:30am   COLONOSCOPY N/A 11/03/2023   Procedure: COLONOSCOPY;  Surgeon: Cindie Carlin POUR, DO;  Location: [facility];  Service: Endoscopy;  Laterality: N/A;  1:00 pm, asa 3   ESOPHAGOGASTRODUODENOSCOPY  05/11/2009   schatzki ring/small hiatal hernia/path:gastritis   ESOPHAGOGASTRODUODENOSCOPY N/A 05/22/2017   Multiple gastric polyps with biopsy benign fundic gland polyp, small bowel biopsy negative for celiac, gastric biopsy with mild gastritis but no H. pylori procedure: ESOPHAGOGASTRODUODENOSCOPY (EGD);  Surgeon: Harvey Margo CROME, MD;  [facility];  Service: Endoscopy;  Laterality: N/A;   ESOPHAGOGASTRODUODENOSCOPY N/A 11/03/2023   Procedure: EGD (ESOPHAGOGASTRODUODENOSCOPY);  Surgeon: Cindie Carlin POUR, DO;  [facility];  Service: Endoscopy;  Laterality: N/A;   ESOPHAGOGASTRODUODENOSCOPY (EGD) WITH ESOPHAGEAL DILATION N/A 04/01/2013   DOQ:dumprulmz at the gastroesophageal juction/multiple small polyps/mild gastritis   GIVENS CAPSULE STUDY N/A 06/07/2017   Frequent gastric erosions, normal small bowel mucosa.  Procedure: GIVENS CAPSULE STUDY;  Surgeon: Harvey Margo CROME,  MD;  Location: [facility];  Service: Endoscopy;  Laterality: N/A;  7:30am   INSERT / REPLACE / REMOVE PACEMAKER     Last year per pt.(can't remember date)   JOINT REPLACEMENT  [phone]   REPLACEMENT TOTAL KNEE Right 08/2020   Family History  Problem Relation Age of Onset   Anxiety disorder Mother    Cancer Mother    Heart disease Father    Hypertension Father    Colon polyps Neg Hx    Colon cancer Neg Hx    Social History   Socioeconomic History   Marital status: Married    Spouse name: Doctor, general practice   Number of children: 3   Years of education: 16   Highest education level: Master's degree (e.g., MA, MS, MEng, MEd, MSW, MBA)  Occupational History   Not on file  Tobacco Use   Smoking status: Never    Passive exposure: Never   Smokeless tobacco: Never   Tobacco comments:    Never smoked   Vaping Use   Vaping status: Never Used  Substance and Sexual Activity   Alcohol use: Not Currently    Comment: occ wine   Drug use: No   Sexual activity: Not Currently  Other Topics Concern   Not on file  Social History Narrative      Lives with husband-51 years 950 in Aug 2021   Daughter is close by, 2 sons live further away   One IN [place],ONE IN [place], ONE BESIDE HER.        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   Enjoys: reading, young adult      Diet: eats all food groups    Caffeine: coffee 1, tea daily soda-1 daily   Water : 1-2 cups      Wears seat belt    Does not use phone while driving   Smoke detectors at home    Licensed conveyancer -locked up      Left handed   One story home   Drinks caffeine   Social Drivers of Health   Financial Resource Strain: Low Risk  (12/07/2023)   Overall Financial Resource Strain (CARDIA)    Difficulty of Paying Living Expenses: Not hard at all  Food Insecurity: No Food Insecurity (12/07/2023)  Hunger Vital Sign    Worried About Running Out of Food in the Last Year: Never true    Ran Out of Food in the Last  Year: Never true  Transportation Needs: No Transportation Needs (12/07/2023)   PRAPARE - Administrator, Civil Service (Medical): No    Lack of Transportation (Non-Medical): No  Physical Activity: Inactive (12/07/2023)   Exercise Vital Sign    Days of Exercise per Week: 0 days    Minutes of Exercise per Session: Not on file  Stress: Stress Concern Present (12/07/2023)   [employer] of Occupational Health - Occupational Stress Questionnaire    Feeling of Stress: Rather much  Social Connections: Socially Integrated (12/07/2023)   Social Connection and Isolation Panel    Frequency of Communication with Friends and Family: Three times a week    Frequency of Social Gatherings with Friends and Family: Twice a week    Attends Religious Services: 1 to 4 times per year    Active Member of [employer] or Organizations: Yes    Attends Banker Meetings: 1 to 4 times per year    Marital Status: Married  Catering manager Violence: Not At Risk (10/18/2023)   Humiliation, Afraid, Rape, and Kick questionnaire    Fear of Current or Ex-Partner: No    Emotionally Abused: No    Physically Abused: No    Sexually Abused: No    Review of Systems Constitutional: Patient denies any unintentional weight loss or change in strength lntegumentary: Patient denies any rashes or pruritus Cardiovascular: Patient denies chest pain or syncope Respiratory: Patient denies shortness of breath Gastrointestinal: ***Patient denies nausea, vomiting, constipation, or diarrhea ***As per HPI Musculoskeletal: Patient denies muscle cramps or weakness Neurologic: Patient denies convulsions or seizures Allergic/Immunologic: Patient denies recent allergic reaction(s) Hematologic/Lymphatic: Patient denies bleeding tendencies Endocrine: Patient denies heat/cold intolerance  GU: As per HPI.  OBJECTIVE There were no vitals filed for this visit. There is no height or weight on file to calculate  BMI.  Physical Examination Constitutional: No obvious distress; patient is non-toxic appearing  Cardiovascular: No visible lower extremity edema.  Respiratory: The patient does not have audible wheezing/stridor; respirations do not appear labored  Gastrointestinal: Abdomen non-distended Musculoskeletal: Normal ROM of UEs  Skin: No obvious rashes/open sores  Neurologic: CN 2-12 grossly intact Psychiatric: Answered questions appropriately with normal affect  Hematologic/Lymphatic/Immunologic: No obvious bruises or sites of spontaneous bleeding  UA: ***negative ***positive for *** leukocytes, *** blood, ***nitrites Urine microscopy: *** WBC/hpf, *** RBC/hpf, *** bacteria ***glucosuria (secondary to ***Jardiance ***Farxiga use) ***otherwise unremarkable  PVR: *** ml  ASSESSMENT No diagnosis found. ***  We agreed to plan for follow up in *** months / ***1 year or sooner if needed. Patient verbalized understanding of and agreement with current plan. All questions were answered.  PLAN Advised the following: 1. *** 2. ***No follow-ups on file.  No orders of the defined types were placed in this encounter.   It has been explained that the patient is to follow regularly with their PCP in addition to all other providers involved in their care and to follow instructions provided by these respective offices. Patient advised to contact urology clinic if any urologic-pertaining questions, concerns, new symptoms or problems arise in the interim period.  There are no Patient Instructions on file for this visit.  Electronically signed by:  Lauraine JAYSON Oz, FNP   12/21/23    1:47 PM

## 2023-12-22 ENCOUNTER — Ambulatory Visit: Admitting: Urology

## 2023-12-22 DIAGNOSIS — N398 Other specified disorders of urinary system: Secondary | ICD-10-CM

## 2023-12-22 DIAGNOSIS — N3281 Overactive bladder: Secondary | ICD-10-CM

## 2023-12-25 ENCOUNTER — Ambulatory Visit: Payer: Self-pay

## 2023-12-25 ENCOUNTER — Ambulatory Visit (HOSPITAL_COMMUNITY)
Admission: RE | Admit: 2023-12-25 | Discharge: 2023-12-25 | Disposition: A | Discharge status: Home / Self Care | Source: Ambulatory Visit

## 2023-12-25 ENCOUNTER — Ambulatory Visit (INDEPENDENT_AMBULATORY_CARE_PROVIDER_SITE_OTHER)

## 2023-12-25 VITALS — BP 97/60 | HR 73 | Ht 64.0 in | Wt 133.1 lb

## 2023-12-25 DIAGNOSIS — M7918 Myalgia, other site: Secondary | ICD-10-CM | POA: Diagnosis present

## 2023-12-25 NOTE — Patient Instructions (Signed)
 Obtain x-ray at [facility].  Add Colace (stool softener) to help with bowel movements.   Tylenol  as needed for pain relief.

## 2023-12-25 NOTE — Progress Notes (Signed)
   Established Patient Office Visit  Subjective   Patient ID: Courtney Rogers, female    DOB: 04-13-1951  Age: 73 y.o. MRN: [id number]  Chief Complaint  Patient presents with   Medical Management of Chronic Issues    Pain in buttocks     HPI   ROS    Objective:     BP 97/60   Pulse 73   Ht 5' 4 (1.626 m)   Wt 133 lb 1.3 oz (60.4 kg)   SpO2 (!) 16%   BMI 22.84 kg/m  BP Readings from Last 3 Encounters:  01/03/24 120/71  12/25/23 97/60  12/20/23 91/62   Wt Readings from Last 3 Encounters:  01/03/24 134 lb 9.6 oz (61.1 kg)  12/27/23 133 lb (60.3 kg)  12/25/23 133 lb 1.3 oz (60.4 kg)      Physical Exam Vitals and nursing note reviewed.  Constitutional:      Appearance: Normal appearance.  HENT:     Head: Normocephalic.  Eyes:     Extraocular Movements: Extraocular movements intact.     Pupils: Pupils are equal, round, and reactive to light.  Cardiovascular:     Rate and Rhythm: Normal rate and regular rhythm.  Pulmonary:     Effort: Pulmonary effort is normal.     Breath sounds: Normal breath sounds.  Musculoskeletal:     Cervical back: Normal range of motion and neck supple.       Back:     Comments: Coccygeal pain  Neurological:     Mental Status: She is alert and oriented to person, place, and time.  Psychiatric:        Mood and Affect: Mood normal.        Thought Content: Thought content normal.      No results found for any visits on 12/25/23.    The ASCVD Risk score (Arnett DK, et al., 2019) failed to calculate for the following reasons:   Risk score cannot be calculated because patient has a medical history suggesting prior/existing ASCVD    Assessment & Plan:   Problem List Items Addressed This Visit   None Visit Diagnoses       Pain in right buttock    -  Primary   She denies history of recent fall or other trauma.  Will obtain x-ray for further evaluation.   Relevant Orders   DG Pelvis 1-2 Views (Completed)       Return  for as needed.    Leita Longs, FNP

## 2023-12-25 NOTE — Telephone Encounter (Signed)
 FYI Only or Action Required?: FYI only for provider.  Patient was last seen in primary care on 12/11/2023 by Zarwolo, [name], FNP.  Called Nurse Triage reporting Back Pain.  Symptoms began about a month ago.  Interventions attempted: OTC medications: Tylenol .  Symptoms are: Constant.  Triage Disposition: See PCP Within 2 Weeks  Patient/caregiver understands and will follow disposition?:   FYI: Triage very limited. RN unsure if patient fully understood questions. Patient says that no one is with her to assist in scheduling, but later says that her sister is there. Went ahead and scheduled for reports of low back/buttocks pain persisting for the past month.  Copied from CRM [id number]. Topic: Clinical - Red Word Triage >> Dec 25, 2023 10:39 AM Elle L wrote: Red Word that prompted transfer to Nurse Triage: The patient states she fell back in October and she is currently experiencing pain on her buttocks. Reason for Disposition  Back pain present > 2 weeks  Answer Assessment - Initial Assessment Questions 1. ONSET: When did the pain begin? (e.g., minutes, hours, days)     1 month ago  2. LOCATION: Where does it hurt? (upper, mid or lower back)     Buttocks area, right side and near waist area  3. SEVERITY: How bad is the pain?  (e.g., Scale 1-10; mild, moderate, or severe)     Moderate  4. PATTERN: Is the pain constant? (e.g., yes, no; constant, intermittent)      Yes  5. RADIATION: Does the pain shoot into your legs or somewhere else?     *No Answer* 6. CAUSE:  What do you think is causing the back pain?      Acute on chronic pain  7. BACK OVERUSE:  Any recent lifting of heavy objects, strenuous work or exercise?     *No Answer* 8. MEDICINES: What have you taken so far for the pain? (e.g., nothing, acetaminophen , NSAIDS)     Tylenol  9. NEUROLOGIC SYMPTOMS: Do you have any weakness, numbness, or problems with bowel/bladder control?     *No Answer* 10. OTHER  SYMPTOMS: Do you have any other symptoms? (e.g., fever, abdomen pain, burning with urination, blood in urine)       *No Answer* 11. PREGNANCY: Is there any chance you are pregnant? When was your last menstrual period?       *No Answer*  Protocols used: Back Pain-A-AH

## 2023-12-26 ENCOUNTER — Encounter: Payer: Self-pay | Admitting: Internal Medicine

## 2023-12-27 ENCOUNTER — Ambulatory Visit: Payer: Medicare Other

## 2023-12-27 VITALS — Ht 64.0 in | Wt 133.0 lb

## 2023-12-27 DIAGNOSIS — Z0000 Encounter for general adult medical examination without abnormal findings: Secondary | ICD-10-CM

## 2023-12-27 DIAGNOSIS — Z780 Asymptomatic menopausal state: Secondary | ICD-10-CM

## 2023-12-27 NOTE — Patient Instructions (Signed)
 Courtney Rogers , Thank you for taking time out of your busy schedule to complete your Annual Wellness Visit with me. I enjoyed our conversation and look forward to speaking with you again next year. I, as well as your care team,  appreciate your ongoing commitment to your health goals. Please review the following plan we discussed and let me know if I can assist you in the future. Your Game plan/ To Do List    Referrals: If you haven't heard from the office you've been referred to, please reach out to them at the phone provided.  Osteoporosis Screening/Yearly Mammogram: Please call the number below to schedule your appt. [facility] Phone: [phone]  Follow up Visits: We will see or speak with you next year for your Next Medicare AWV with our clinical staff  Clinician Recommendations:  Aim for 30 minutes of exercise or brisk walking, 6-8 glasses of water , and 5 servings of fruits and vegetables each day.    Wishing you many blessings and good health during the next year until our next visit.  -Su Duma   This is a list of the screenings recommended for you:  Health Maintenance  Topic Date Due   DEXA scan (bone density measurement)  02/06/2022   COVID-19 Vaccine (5 - 2024-25 season) 01/22/2023   Flu Shot  12/22/2023   Mammogram  07/30/2024   Medicare Annual Wellness Visit  12/26/2024   DTaP/Tdap/Td vaccine (2 - Td or Tdap) 07/15/2030   Colon Cancer Screening  11/02/2033   Pneumococcal Vaccine for age over 79  Completed   Hepatitis C Screening  Completed   Zoster (Shingles) Vaccine  Completed   Hepatitis B Vaccine  Aged Out   HPV Vaccine  Aged Out   Meningitis B Vaccine  Aged Out    Advanced directives: (Declined) Advance directive discussed with you today. Even though you declined this today, please call our office should you change your mind, and we can give you the proper paperwork for you to fill out. Advance Care Planning is important because it:  [x]   Makes sure you receive the medical care that is consistent with your values, goals, and preferences  [x]  It provides guidance to your family and loved ones and reduces their decisional burden about whether or not they are making the right decisions based on your wishes.  Follow the link provided in your after visit summary or read over the paperwork we have mailed to you to help you started getting your Advance Directives in place. If you need assistance in completing these, please reach out to us  so that we can help you!  See attachments for Preventive Care and Fall Prevention Tips.

## 2023-12-27 NOTE — Progress Notes (Signed)
 Please attest this visit in the absence of patient primary care provider.  Subjective:   Courtney Rogers is a 73 y.o. who presents for a Medicare Wellness preventive visit.  As a reminder, Annual Wellness Visits don't include a physical exam, and some assessments may be limited, especially if this visit is performed virtually. We may recommend an in-person follow-up visit with your provider if needed.  Visit Complete: Virtual I connected with  Courtney Rogers on 12/27/23 by a video and audio enabled telemedicine application and verified that I am speaking with the correct person using two identifiers.  Patient Location: Home  Provider Location: Home Office  I discussed the limitations of evaluation and management by telemedicine. The patient expressed understanding and agreed to proceed.  Vital Signs: Because this visit was a virtual/telehealth visit, some criteria may be missing or patient reported. Any vitals not documented were not able to be obtained and vitals that have been documented are patient reported.  Persons Participating in Visit: Patient assisted by daughter.  AWV Questionnaire: Yes: Patient Medicare AWV questionnaire was completed by the patient on 12/23/2023; I have confirmed that all information answered by patient is correct and no changes since this date.  Cardiac Risk Factors include: advanced age (>101men, >31 women);dyslipidemia;sedentary lifestyle;hypertension;Other (see comment), Risk factor comments: OSA     Objective:    Today's Vitals   12/23/23 1309 12/27/23 1600  Weight:  133 lb (60.3 kg)  Height:  5' 4 (1.626 m)  PainSc: 0-No pain    Body mass index is 22.83 kg/m.     12/27/2023    3:58 PM 12/20/2023    3:23 PM 12/20/2023    1:27 PM 12/11/2023    3:47 PM 11/20/2023   11:52 AM 11/16/2023    8:11 AM 11/03/2023    1:07 PM  Advanced Directives  Does Patient Have a Medical Advance Directive? No No No No No Yes;No Yes  Type of Theme park manager;Living will   Healthcare Power of [employer] of [place];Living will  Does patient want to make changes to medical advance directive?  No - Patient declined No - Patient declined   No - Patient declined   Copy of Healthcare Power of Attorney in Chart?   No - copy requested      Would patient like information on creating a medical advance directive? No - Patient declined No - Patient declined No - Patient declined No - Patient declined No - Patient declined No - Patient declined     Current Medications (verified) Outpatient Encounter Medications as of 12/27/2023  Medication Sig   acetaminophen  (TYLENOL ) 500 MG tablet Take 1,000 mg by mouth 2 (two) times daily as needed for moderate pain or headache.   acyclovir  (ZOVIRAX ) 400 MG tablet TAKE ONE TABLET BY MOUTH TWICE DAILY   ASPIRIN LOW DOSE 81 MG tablet Take 81 mg by mouth daily.   B Complex-Folic Acid  (B COMPLEX VITAMINS, W/ FA,) CAPS Take 1 capsule by mouth daily.   busPIRone  (BUSPAR ) 5 MG tablet Take 5 mg by mouth daily.   Calcium Carb-Cholecalciferol 600-10 MG-MCG TABS Take 1 tablet by mouth 2 (two) times daily.   Calcium Carbonate-Vitamin D  (CALCIUM 600+D PO) Take 1 tablet by mouth 2 (two) times daily.   chlorthalidone (HYGROTON) 25 MG tablet Take 25 mg by mouth daily. Stop taking today per Dr.Katragadda   citalopram  (CELEXA ) 20 MG tablet Take 1 tablet (20 mg total) by mouth daily.  cyanocobalamin  (VITAMIN B12) 1000 MCG tablet Take 1,000 mcg by mouth daily.   diclofenac  Sodium (VOLTAREN ) 1 % GEL Apply 4 g topically 4 (four) times daily.   dicyclomine  (BENTYL ) 10 MG capsule TAKE ONE CAPSULE BY MOUTH THREE TIMES A DAY BEFORE meals   FEROSUL 325 (65 Fe) MG tablet Take 325 mg by mouth daily.   fluticasone  (FLONASE ) 50 MCG/ACT nasal spray USE 2 SPRAYS IN EACH NOSTRIL ONCE DAILY   folic acid  (FOLVITE ) 1 MG tablet Take 1 mg by mouth daily.   GNP VITAMIN C 500 MG tablet Take 500 mg by mouth daily.    lenalidomide  (REVLIMID ) 10 MG capsule Take 1 capsule (10 mg total) by mouth daily. 21 days on, 7 days off every 28 days   levocetirizine (XYZAL ) 5 MG tablet TAKE ONE TABLET BY MOUTH IN THE EVENING   lidocaine  (LIDODERM ) 5 % Place 1 patch onto the skin daily. Remove & Discard patch within 12 hours or as directed by MD   magnesium  oxide (MAG-OX) 400 (240 Mg) MG tablet TAKE ONE TABLET BY MOUTH TWICE DAILY   metoprolol  tartrate (LOPRESSOR ) 50 MG tablet Take 1 tablet (50 mg total) by mouth 2 (two) times daily.   montelukast  (SINGULAIR ) 10 MG tablet TAKE ONE TABLET BY MOUTH AT BEDTIME   MYRBETRIQ  50 MG TB24 tablet TAKE ONE TABLET BY MOUTH ONCE DAILY   NICODERM CQ 14 MG/24HR patch 14 mg daily.   nitroGLYCERIN (NITROSTAT) 0.4 MG SL tablet Place 0.4 mg under the tongue every 5 (five) minutes as needed for chest pain.   pantoprazole  (PROTONIX ) 40 MG tablet TAKE ONE TABLET BY MOUTH TWICE DAILY   potassium chloride  SA (KLOR-CON  M) 20 MEQ tablet TAKE ONE TABLET BY MOUTH TWICE DAILY   pregabalin  (LYRICA ) 50 MG capsule TAKE ONE CAPSULE BY MOUTH TWICE DAILY   primidone (MYSOLINE) 50 MG tablet TAKE ONE TABLET BY MOUTH TWICE DAILY   Probiotic Product (GNP PROBIOTIC COLON SUPPORT) CAPS Take 1 capsule by mouth daily.   sertraline  (ZOLOFT ) 50 MG tablet Take 50 mg by mouth daily.   simvastatin  (ZOCOR ) 20 MG tablet Take 20 mg by mouth at bedtime.   topiramate (TOPAMAX) 25 MG tablet Take 25 mg by mouth 2 (two) times daily.   traMADol  (ULTRAM ) 50 MG tablet Take 1 tablet (50 mg total) by mouth every 6 (six) hours as needed.   warfarin (COUMADIN) 3 MG tablet Take 3 mg by mouth daily.   warfarin (COUMADIN) 6 MG tablet Take 6 mg by mouth at bedtime.   cholecalciferol (VITAMIN D3) 10 MCG (400 UNIT) TABS tablet Take 400 Units by mouth daily. (Patient not taking: Reported on 12/27/2023)   Na Sulfate-K Sulfate-Mg Sulfate concentrate (SUPREP) 17.5-3.13-1.6 GM/177ML SOLN As directed (Patient not taking: Reported on 11/29/2023)   No  facility-administered encounter medications on file as of 12/27/2023.    Allergies (verified) Amoxicillin-pot clavulanate, Clarithromycin, Erythromycin, Lisinopril, and Amoxicillin   History: Past Medical History:  Diagnosis Date   Acute myocardial infarction [facility]) 2009   CAD/no stent medically managed   Allergy    Anxiety disorder    CAD (coronary artery disease)    Cancer (HCC)    multiple myeloma   CHF (congestive heart failure) (HCC) 959879   Glaucoma [id number]   Hyperlipidemia    Hypertension    IBS (irritable bowel syndrome)    Non-ulcer dyspepsia 04/30/2009   Qualifier: Diagnosis of  By: Harvey MD, Sandi L    Overactive bladder    PE (pulmonary embolism)  10/2012   left lung   Presence of permanent cardiac pacemaker    Sleep apnea    Past Surgical History:  Procedure Laterality Date   ABDOMINAL HYSTERECTOMY     BSO secondary to cyst     CARDIAC CATHETERIZATION     CATARACT EXTRACTION W/PHACO Left 11/16/2023   Procedure: PHACOEMULSIFICATION, CATARACT, WITH IOL INSERTION 5.45, 00:43.2;  Surgeon: Enola Feliciano Hugger, MD;  Location: [facility];  Service: Ophthalmology;  Laterality: Left;   COLONOSCOPY  12/2009   Dr. Rhoda, propofol , normal. Next TCS 12/2019   COLONOSCOPY N/A 05/22/2017   Examined portion ileum was normal, significant looping of colon, external hemorrhoids and rectal bleeding due to internal hemorrhoids, mild diverticulosis procedure: COLONOSCOPY;  Surgeon: Harvey Margo CROME, MD;  [facility];  Service: Endoscopy;  Laterality: N/A;  10:30am   COLONOSCOPY N/A 11/03/2023   Procedure: COLONOSCOPY;  Surgeon: Cindie Carlin POUR, DO;  Location: [facility];  Service: Endoscopy;  Laterality: N/A;  1:00 pm, asa 3   ESOPHAGOGASTRODUODENOSCOPY  05/11/2009   schatzki ring/small hiatal hernia/path:gastritis   ESOPHAGOGASTRODUODENOSCOPY N/A 05/22/2017   Multiple gastric polyps with biopsy benign fundic gland polyp, small bowel biopsy negative for  celiac, gastric biopsy with mild gastritis but no H. pylori procedure: ESOPHAGOGASTRODUODENOSCOPY (EGD);  Surgeon: Harvey Margo CROME, MD;  [facility];  Service: Endoscopy;  Laterality: N/A;   ESOPHAGOGASTRODUODENOSCOPY N/A 11/03/2023   Procedure: EGD (ESOPHAGOGASTRODUODENOSCOPY);  Surgeon: Cindie Carlin POUR, DO;  [facility];  Service: Endoscopy;  Laterality: N/A;   ESOPHAGOGASTRODUODENOSCOPY (EGD) WITH ESOPHAGEAL DILATION N/A 04/01/2013   DOQ:dumprulmz at the gastroesophageal juction/multiple small polyps/mild gastritis   GIVENS CAPSULE STUDY N/A 06/07/2017   Frequent gastric erosions, normal small bowel mucosa.  Procedure: GIVENS CAPSULE STUDY;  Surgeon: Harvey Margo CROME, MD;  [facility];  Service: Endoscopy;  Laterality: N/A;  7:30am   INSERT / REPLACE / REMOVE PACEMAKER     Last year per pt.(can't remember date)   JOINT REPLACEMENT  [phone]   REPLACEMENT TOTAL KNEE Right 08/2020   Family History  Problem Relation Age of Onset   Anxiety disorder Mother    Cancer Mother    Heart disease Father    Hypertension Father    Colon polyps Neg Hx    Colon cancer Neg Hx    Social History   Socioeconomic History   Marital status: Married    Spouse name: Doctor, general practice   Number of children: 3   Years of education: 16   Highest education level: Master's degree (e.g., MA, MS, MEng, MEd, MSW, MBA)  Occupational History   Not on file  Tobacco Use   Smoking status: Never    Passive exposure: Never   Smokeless tobacco: Never   Tobacco comments:    Never smoked   Vaping Use   Vaping status: Never Used  Substance and Sexual Activity   Alcohol use: Not Currently    Comment: occ wine   Drug use: No   Sexual activity: Not Currently    Birth control/protection: Abstinence  Other Topics Concern   Not on file  Social History Narrative      Lives with husband-51 years 950 in Aug 2021   Daughter is close by, 2 sons live further away   One IN [place],ONE IN [place],  ONE BESIDE HER.        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   Enjoys: reading, young adult      Diet: eats  all food groups    Caffeine: coffee 1, tea daily soda-1 daily   Water : 1-2 cups      Wears seat belt    Does not use phone while driving   Smoke detectors at home    Licensed conveyancer -locked up      Left handed   One story home   Drinks caffeine   Social Drivers of Health   Financial Resource Strain: Low Risk  (12/27/2023)   Overall Financial Resource Strain (CARDIA)    Difficulty of Paying Living Expenses: Not hard at all  Food Insecurity: No Food Insecurity (12/27/2023)   Hunger Vital Sign    Worried About Running Out of Food in the Last Year: Never true    Ran Out of Food in the Last Year: Never true  Transportation Needs: No Transportation Needs (12/27/2023)   PRAPARE - Administrator, Civil Service (Medical): No    Lack of Transportation (Non-Medical): No  Physical Activity: Inactive (12/27/2023)   Exercise Vital Sign    Days of Exercise per Week: 0 days    Minutes of Exercise per Session: 0 min  Stress: No Stress Concern Present (12/27/2023)   [employer] of Occupational Health - Occupational Stress Questionnaire    Feeling of Stress: Not at all  Recent Concern: Stress - Stress Concern Present (12/07/2023)   [employer] of Occupational Health - Occupational Stress Questionnaire    Feeling of Stress: Rather much  Social Connections: Socially Integrated (12/27/2023)   Social Connection and Isolation Panel    Frequency of Communication with Friends and Family: More than three times a week    Frequency of Social Gatherings with Friends and Family: More than three times a week    Attends Religious Services: More than 4 times per year    Active Member of [employer] or Organizations: Yes    Attends Engineer, structural: More than 4 times per year    Marital Status: Married    Tobacco Counseling Counseling given: Yes Tobacco  comments: Never smoked     Clinical Intake:  Pre-visit preparation completed: Yes  Pain : No/denies pain Pain Score: 0-No pain     BMI - recorded: 22.83 Nutritional Risks: None Diabetes: No  Lab Results  Component Value Date   HGBA1C 5.6 02/24/2023   HGBA1C 5.2 06/27/2022   HGBA1C 5.3 02/14/2022     How often do you need to have someone help you when you read instructions, pamphlets, or other written materials from your doctor or pharmacy?: 1 - Never  Interpreter Needed?: No  Information entered by :: Stefano ORN [facility]   Activities of Daily Living     12/23/2023    1:09 PM 11/16/2023    8:25 AM  In your present state of health, do you have any difficulty performing the following activities:  Hearing? 0  0  Vision? 0  1  Difficulty concentrating or making decisions? 1  0  Walking or climbing stairs? 1    Dressing or bathing? 1    Doing errands, shopping? 1    Preparing Food and eating ? N    Using the Toilet? N    In the past six months, have you accidently leaked urine? N    Do you have problems with loss of bowel control? N    Managing your Medications? Y    Managing your Finances? Y    Housekeeping or managing your Housekeeping? Y  Proxy-reported    Patient Care Team: Zarwolo, [name], FNP as PCP - General (Family Medicine) Rogers Hai, MD as Medical Oncologist (Oncology) Wilson, Diane G, RN as Oncology Nurse Navigator (Oncology) Cindie Carlin POUR, DO as Consulting Physician (Internal Medicine) Frances Ozell RAMAN, LCSW as [facility] Care Management (Licensed Clinical Social Worker)  I have updated your Care Teams any recent Medical Services you may have received from other providers in the past year.     Assessment:   This is a routine wellness examination for [facility].  Hearing/Vision screen Hearing Screening - Comments:: Patient denies any hearing difficulties.   Vision Screening - Comments:: Wears rx glasses - up to date  with routine eye exams with  [facility], Dr. Odetta Bathe   Goals Addressed             This Visit's Progress    COMPLETED: Patient Stated       Will get another booster covid vaccine since over the age of 56     Patient Stated   On track    To work on obtaining better balance. Has had several falls.      Patient Stated       I want to beat cancer       Depression Screen     12/27/2023    4:14 PM 12/20/2023    1:24 PM 12/11/2023    1:51 PM 11/20/2023   11:50 AM 09/25/2023    2:08 PM 08/22/2023   11:37 AM 06/27/2023    3:22 PM  PHQ 2/9 Scores  PHQ - 2 Score 0 0 0 0 6 0 0  PHQ- 9 Score 0 0 0  18 0 0    Fall Risk     12/23/2023    1:09 PM 12/11/2023    1:51 PM 10/18/2023    3:01 PM 09/25/2023    2:08 PM 08/22/2023   11:37 AM  Fall Risk   Falls in the past year? 1  1 1  0 0  Number falls in past yr: 1  1 0 0 0  Injury with Fall? 0  1 0 0 0  Risk for fall due to : History of fall(s);Impaired balance/gait;Impaired mobility Impaired balance/gait;Impaired mobility  Impaired balance/gait Impaired balance/gait;Other (Comment)  Follow up Falls evaluation completed;Education provided;Falls prevention discussed Falls evaluation completed;Education provided;Falls prevention discussed  Follow up appointment Falls evaluation completed;Education provided;Falls prevention discussed;Follow up appointment     Proxy-reported    MEDICARE RISK AT HOME:  Medicare Risk at Home Any stairs in or around the home?: (Proxy-Rptd) No If so, are there any without handrails?: (Proxy-Rptd) Yes Home free of loose throw rugs in walkways, pet beds, electrical cords, etc?: (Proxy-Rptd) Yes Adequate lighting in your home to reduce risk of falls?: (Proxy-Rptd) Yes Life alert?: (Proxy-Rptd) No Use of a cane, walker or w/c?: (Proxy-Rptd) Yes Grab bars in the bathroom?: (Proxy-Rptd) No Shower chair or bench in shower?: (Proxy-Rptd) Yes Elevated toilet seat or a handicapped toilet?: (Proxy-Rptd)  No  TIMED UP AND GO:  Was the test performed?  No  Cognitive Function: 6CIT completed        12/27/2023    4:14 PM 12/21/2022    3:31 PM 11/15/2021    9:42 AM 11/11/2020   11:42 AM 11/11/2020    9:31 AM  6CIT Screen  What Year? 0 points 0 points 0 points 0 points 0 points  What month? 0 points 0 points 0 points 0 points 0 points  What time? 0 points 0 points 0 points 0 points 0 points  Count back from 20 0 points 0 points 0 points 0 points 0 points  Months in reverse 0 points 0 points 0 points 0 points 0 points  Repeat phrase 0 points 0 points 0 points 0 points 0 points  Total Score 0 points 0 points 0 points 0 points 0 points    Immunizations Immunization History  Administered Date(s) Administered   Fluad Quad(high Dose 65+) 02/22/2020   Influenza, High Dose Seasonal PF 01/23/2019   Influenza,inj,Quad PF,6+ Mos 02/21/2020   Influenza,inj,quad, With Preservative 02/09/2018   Influenza-Unspecified 03/04/2021, 03/12/2023   Moderna Sars-Covid-2 Vaccination 06/13/2019, 07/10/2019, 06/09/2020, 03/04/2021   Pneumococcal Conjugate-13 12/14/2017   Pneumococcal Polysaccharide-23 07/15/2020   Tdap 07/15/2020   Zoster Recombinant(Shingrix) 12/28/2020, 04/02/2021    Screening Tests Health Maintenance  Topic Date Due   DEXA SCAN  02/06/2022   COVID-19 Vaccine (5 - 2024-25 season) 01/22/2023   INFLUENZA VACCINE  12/22/2023   MAMMOGRAM  07/30/2024   Medicare Annual Wellness (AWV)  12/26/2024   DTaP/Tdap/Td (2 - Td or Tdap) 07/15/2030   Colonoscopy  11/02/2033   Pneumococcal Vaccine: 50+ Years  Completed   Hepatitis C Screening  Completed   Zoster Vaccines- Shingrix  Completed   Hepatitis B Vaccines  Aged Out   HPV VACCINES  Aged Out   Meningococcal B Vaccine  Aged Out    Health Maintenance  Health Maintenance Due  Topic Date Due   DEXA SCAN  02/06/2022   COVID-19 Vaccine (5 - 2024-25 season) 01/22/2023   INFLUENZA VACCINE  12/22/2023   Health Maintenance Items  Addressed: DEXA ordered  Additional Screening:  Vision Screening: Recommended annual ophthalmology exams for early detection of glaucoma and other disorders of the eye. Would you like a referral to an eye doctor? No    Dental Screening: Recommended annual dental exams for proper oral hygiene  Community Resource Referral / Chronic Care Management: CRR required this visit?  No   CCM required this visit?  No   Plan:    I have personally reviewed and noted the following in the patient's chart:   Medical and social history Use of alcohol, tobacco or illicit drugs  Current medications and supplements including opioid prescriptions. Patient is not currently taking opioid prescriptions. Functional ability and status Nutritional status Physical activity Advanced directives List of other physicians Hospitalizations, surgeries, and ER visits in previous 12 months Vitals Screenings to include cognitive, depression, and falls Referrals and appointments  In addition, I have reviewed and discussed with patient certain preventive protocols, quality metrics, and best practice recommendations. A written personalized care plan for preventive services as well as general preventive health recommendations were provided to patient.   Melynda Krzywicki, CMA   12/27/2023   After Visit Summary: (MyChart) Due to this being a telephonic visit, the after visit summary with patients personalized plan was offered to patient via MyChart   Notes: Nothing significant to report at this time.

## 2023-12-28 ENCOUNTER — Other Ambulatory Visit: Payer: Self-pay

## 2023-12-28 ENCOUNTER — Encounter (HOSPITAL_COMMUNITY): Payer: Self-pay

## 2023-12-28 ENCOUNTER — Encounter (HOSPITAL_COMMUNITY)
Admission: RE | Admit: 2023-12-28 | Discharge: 2023-12-28 | Disposition: A | Discharge status: Home / Self Care | Source: Ambulatory Visit | Attending: Hematology | Admitting: Hematology

## 2023-12-28 DIAGNOSIS — C9000 Multiple myeloma not having achieved remission: Secondary | ICD-10-CM | POA: Insufficient documentation

## 2024-01-01 ENCOUNTER — Other Ambulatory Visit: Payer: Self-pay | Admitting: Family Medicine

## 2024-01-01 ENCOUNTER — Other Ambulatory Visit: Payer: Self-pay | Admitting: Hematology

## 2024-01-01 DIAGNOSIS — F339 Major depressive disorder, recurrent, unspecified: Secondary | ICD-10-CM

## 2024-01-01 DIAGNOSIS — R32 Unspecified urinary incontinence: Secondary | ICD-10-CM

## 2024-01-01 DIAGNOSIS — J301 Allergic rhinitis due to pollen: Secondary | ICD-10-CM

## 2024-01-01 NOTE — Progress Notes (Signed)
 Cardiologist: Dr. Michelina Rogers [place], [place]    Consult from: Dr. Michelina Rogers   Reason for Consultation: Complete Heart Block (PACEMAKER DEPENDENT) and Sick Sinus Syndrome with ParoxAF (OAC=Coumadin) w/ LEFT sided MDT dPM (5076/5076; Dr. Cleotilde 04/07/2014) at [facility] (debility, wheelchair, sitter, multiple myeloma Rx Velcade )  Implanted Hardware: Medtronic Advisa A2DR01 Dual chamber Pacemaker (SN: [id number]), RA= Medtronic 5076 (SN: [id number]), RV= Medtronic 5076 (SN: [id number]), all implanted 03/28/2014 by Dr. Arley Rogers.    Advisory Status: None   Underlying Rhythm: Complete Heart Block (PACEMAKER DEPENDENT)  Transtelephonic Monitoring: None (device followed by Dr. Michelina Rogers in his Cardiology Clinic)  Magnet Function: Asynchronous pacing    HPI: Courtney Rogers is a 73 y.o. female with PENICILLIN ALLERGY (Augmentin by Hx), CLARITHROMYCIN ALLERGY (per chart), LISINOPRIL ALLERGY (per chart), HTN, HLD, Asthma, Debility (h/o Fall, chronic pain, Wheelchair, Courtney Rogers), Multiple Myeloma (Rx Velcade ), h/o Obesity/OSA-->Body mass index is 23.31 kg/m., h/o PE (per notes from Dr. Ailene); OAC=Coumadin, Complete Heart Block (PACEMAKER DEPENDENT) and Sick Sinus Syndrome with ParoxAF (OAC=Coumadin) w/ LEFT sided MDT dPM (5076/5076; Dr. Cleotilde 04/07/2014) at recommended replacement time.  ParoxAF OAC=Coumadin (CHADSvasc= 4 htn, age, gender); RATE=Metop50qday (CHB, PM-DEPENDENT); AAD=None.  12 lead electrocardiogram 01/01/2024= SR AS/AP-RVP 64bpm, RVA paced QRS=279msec, QTc-569msec.  EF=50-55% with grade I diastolic dysfunction by TTE 10/01/2014.  The risks, benefits and alternatives to device generator changeout were reviewed along with procedural description.  Risks including, but not limited to bleeding, and infection (emphasis on risks of infection with description of consequences and risks associated with infection in the setting of chronic leads).   If a new lead is required, additional  risks including, but not limited to lung collapse, thromboembolic phenomenon, viscus perforation requiring urgent surgery, heart attack, stroke, death, device malfunction, need for venoplasty and/or tunneling may be encountered.  Following review of risks, benefits, alternatives, time was allowed for questions.  Questions answered.    History of Present Illness Courtney Rogers is a 73 year old female with complete heart block who presents for dual chamber pacemaker battery replacement. She is accompanied by a Courtney Rogers. She was referred by Dr. Michelina Rogers for pacemaker battery replacement.  She presents to the EP clinic for evaluation and management of her dual chamber pacemaker, specifically for battery replacement. The pacemaker, a Medtronic device located on the left side, has reached its recommended replacement time as of December 27, 2023. The device and lead functions are stable and normal. She is pacemaker dependent due to complete heart block.  The pacemaker's atrial impedance is 418 ohms, and the right ventricular pacing impedance is 608 ohms. The atrial pacing threshold is 0.625 volts at 0.4 milliseconds, and the right ventricular pacing percentage is 1.375 volts at 0.4 milliseconds. P waves are sensed at 2.5 millivolts, and ventricular pacing percentage is 100%. Atrial pacing percentage is 38.6%, with no episodes of atrial fibrillation or ventricular high rate episodes reported. Cardiac compass trends show variable percentages of atrial pacing with 100% ventricular pacing. Her average heart rates have been relatively stable.  A twelve lead EKG was performed on January 01, 2024. A [facility] chest x-ray was available for review, copied and pasted above.  EF=50-55% with grade I diastolic dysfunction by Transthoracic Echocardiogram 10/01/2014.  Her current medications include citalopram  for depression and anxiety, chlorthalidone, losartan , and amlodipine  for hypertension, and Coumadin for anticoagulation due  to a history of paroxysmal atrial fibrillation and history of PE (per chart review from Dr. Michelina Rogers). Her CHADS-VASc score is 4, attributed to hypertension, gender, and  age. The Coumadin dosing regimen is 6 mg daily, except for 9 mg on Friday, Saturday, and Sunday, targeting an INR of 2.0 to 3.0.  She engages in minimal physical activity, less than one hour per day by Cardiac Compass Trends.  Present to electrophysiology clinic in wheelchair with sitter.  History of Fall.  History of obesity and OSA with current Body mass index is 23.31 kg/m.  Her lower pacing rate is set at 60 beats per minute, and the upper tracking rate is 130 beats per minute.  Pacing mode from Dr. Ailene clinic follow up is AAIR with DDDR back-up with 100% VPacing.  Mode changed to DDDR in electrophysiology clinic today.  No other programming changes made.                           PMHx: Past Medical History:  Diagnosis Date  . Allergic rhinitis   . Anticoagulation goal of INR 2 to 3    ParoxAF OAC=Coumadin (CHADSvasc= 4 htn, age, gender); h/o PE (per notes from Dr. Ailene)  . Asthma (HHS-HCC)    Rx inhalers  . Bladder spasm    Rx Myrbetriq   . BMI 23.0-23.9, adult    h/o obesity-->BMI=23 (malignancy Rx Velcade )  . CHB (complete heart block) (CMS/HHS-HCC)    CHB, PM-DEPENDENT  . Chronic pain   . CKD (chronic kidney disease)   . Debility    presentation to EP Clinic in wheelchair with sitter (01/01/2024)  . Dependence on wheelchair    presentation to EP Clinic in wheelchair with sitter (01/01/2024)  . Depression with anxiety    Rx SSRI, Buspar   . Environmental allergies   . Fitting and adjustment of cardiac pacemaker 03/28/2014   Medtronic Advisa A2DR01 Dual chamber Pacemaker (SN: [id number]), RA= Medtronic 5076 (SN: [id number]), RV= Medtronic 5076 (SN: [id number]), all implanted 03/28/2014 by Dr. Arley Courtney Rogers.  [name] (gastroesophageal reflux disease)    Rx PPI  . H/O echocardiogram 10/01/2014   EF=50-55%  with grade I diastolic dysfunction by TTE 10/01/2014  . H/O electrocardiogram 01/01/2024   SR AS-VP 64bpm, RVA paced QRS=266msec, QTc-563msec  . H/O fall   . H/O Pulmonary embolism (CMS/HHS-HCC)    h/o PE (per notes from Dr. Ailene); OAC=Coumadin  . HLD (hyperlipidemia)    Rx statin  . HSV infection    Rx acyclovir   . HTN (hypertension)   . Iron deficiency anemia   . Multiple myeloma (CMS/HHS-HCC)    Rx Velcade   . OSA (obstructive sleep apnea)   . Pacemaker-dependent due to native cardiac rhythm insufficient to support life    CHB, PM-DEPENDENT  . Paroxysmal atrial fibrillation (CMS/HHS-HCC)    ParoxAF OAC=Coumadin (CHADSvasc= 4 htn, age, gender); RATE=Metop50qday (CHB, PM-DEPENDENT); AAD=None  . SSS (sick sinus syndrome) (CMS/HHS-HCC)    s/p MDT dPM ParoxAF OAC=Coumadin (CHADSvasc= 4 htn, age, gender); RATE=Metop50qday (CHB, PM-DEPENDENT); AAD=None      Allergies: Allergies  Allergen Reactions  . Augmentin [Amoxicillin-Pot Clavulanate] Unknown    Per chart  . Biaxin [Clarithromycin] Unknown    Per chart  . Lisinopril Unknown    Per chart    Meds: Current Outpatient Medications  Medication Sig Dispense Refill  . acetaminophen  (TYLENOL ) 500 MG tablet Take 1,000 mg by mouth every 8 (eight) hours as needed    . acyclovir  (ZOVIRAX ) 400 MG tablet Take 400 mg by mouth 2 (two) times daily    . amLODIPine  (NORVASC ) 10 MG tablet Take 10 mg by mouth once  daily    . aspirin 81 MG EC tablet Take 81 mg by mouth once daily    . bortezomib  SQ (VELCADE ) 3.5 mg injection Inject subcutaneously    . busPIRone  (BUSPAR ) 5 MG tablet Take 5 mg by mouth once daily    . CALCIUM WITH VITAMIN D  600 mg-10 mcg (400 unit) tablet Take 1 tablet by mouth 2 (two) times daily    . chlorthalidone 25 MG tablet Take 12.5 mg by mouth once daily    . cholecalciferol (VITAMIN D3) 400 unit tablet Take 400 Units by mouth once daily    . cyclobenzaprine  (FLEXERIL ) 10 MG tablet Take 10 mg by mouth 3 (three) times  daily as needed for Muscle spasms    . diclofenac  (VOLTAREN ) 1 % topical gel Apply 2 g topically 4 (four) times daily    . ferrous sulfate  325 (65 FE) MG tablet Take 325 mg by mouth once daily    . fluticasone  propionate (FLONASE ) 50 mcg/actuation nasal spray Place 2 sprays into both nostrils once daily    . levocetirizine (XYZAL ) 5 MG tablet Take 5 mg by mouth every evening    . losartan  (COZAAR ) 100 MG tablet Take 100 mg by mouth once daily    . magnesium  oxide (MAG-OX) 400 mg (241.3 mg magnesium ) tablet Take 1 tablet by mouth 2 (two) times daily    . metoprolol  SUCCinate (TOPROL -XL) 50 MG XL tablet Take 50 mg by mouth once daily    . mirtazapine (REMERON) 15 MG tablet Take 15 mg by mouth at bedtime    . montelukast  (SINGULAIR ) 10 mg tablet Take 10 mg by mouth at bedtime    . MYRBETRIQ  50 mg ER tablet Take 50 mg by mouth once daily    . nitroGLYcerin (NITROSTAT) 0.4 MG SL tablet Place 0.4 mg under the tongue every 5 (five) minutes as needed for Chest pain    . pantoprazole  (PROTONIX ) 40 MG DR tablet Take 40 mg by mouth 2 (two) times daily    . potassium chloride  (KLOR-CON  M10) 10 mEq ER tablet Take 10 mEq by mouth once daily    . pregabalin  (LYRICA ) 50 MG capsule Take 50 mg by mouth 2 (two) times daily    . primidone (MYSOLINE) 50 MG tablet Take 50 mg by mouth 2 (two) times daily    . sertraline  (ZOLOFT ) 50 MG tablet Take 50 mg by mouth once daily    . simvastatin  (ZOCOR ) 40 MG tablet Take 40 mg by mouth at bedtime    . traMADoL  (ULTRAM ) 50 mg tablet Take 50 mg by mouth every 6 (six) hours as needed    . VITAMIN B COMPLEX ORAL Take 1 capsule by mouth once daily    . VITAMIN C 500 MG tablet Take 500 mg by mouth once daily    . warfarin (COUMADIN) 3 MG tablet Take 3 mg by mouth as directed    . warfarin (COUMADIN) 6 MG tablet Take 6 mg by mouth once daily     No current facility-administered medications for this visit.    FHx: No family history on file.  SHx: Social History    Socioeconomic History  . Marital status: Married  Tobacco Use  . Smoking status: Never  . Smokeless tobacco: Never   Social Drivers of Corporate investment banker Strain: Low Risk  (12/27/2023)   Received from [facility]   Overall Financial Resource Strain (CARDIA)   . How hard is it for you to pay for  the very basics like food, housing, medical care, and heating?: Not hard at all  Food Insecurity: No Food Insecurity (12/27/2023)   Received from [facility]   Hunger Vital Sign   . Within the past 12 months, you worried that your food would run out before you got the money to buy more.: Never true   . Within the past 12 months, the food you bought just didn't last and you didn't have money to get more.: Never true  Transportation Needs: No Transportation Needs (12/27/2023)   Received from [facility] - Transportation   . In the past 12 months, has lack of transportation kept you from medical appointments or from getting medications?: No   . In the past 12 months, has lack of transportation kept you from meetings, work, or from getting things needed for daily living?: No  Physical Activity: Inactive (12/27/2023)   Received from [facility]   Exercise Vital Sign   . On average, how many days per week do you engage in moderate to strenuous exercise (like a brisk walk)?: 0 days   . On average, how many minutes do you engage in exercise at this level?: 0 min  Stress: No Stress Concern Present (12/27/2023)   Received from [facility] - Occupational Stress Questionnaire   . Do you feel stress - tense, restless, nervous, or anxious, or unable to sleep at night because your mind is troubled all the time - these days?: Not at all  Recent Concern: Stress - Stress Concern Present (12/07/2023)   Received from [facility] - Occupational Stress Questionnaire   . Do you feel stress - tense, restless, nervous,  or anxious, or unable to sleep at night because your mind is troubled all the time - these days?: Rather much  Social Connections: Socially Integrated (12/27/2023)   Received from [facility]   Social Connection and Isolation Panel   . In a typical week, how many times do you talk on the phone with family, friends, or neighbors?: More than three times a week   . How often do you get together with friends or relatives?: More than three times a week   . How often do you attend church or religious services?: More than 4 times per year   . Do you belong to any clubs or organizations such as church groups, unions, fraternal or athletic groups, or school groups?: Yes   . How often do you attend meetings of the clubs or organizations you belong to?: More than 4 times per year   . Are you married, widowed, divorced, separated, never married, or living with a partner?: Married    ROS: H/O Rx Acyclovir .  No fevers or chills.  Bladder spasms Rx MYRBETRIQ .  GERD Rx PPI.  H/O dyspepsia and irritable bowel syndrome  (per chart).  No acute bowel or bladder complaints.  SOB, h/o Asthma, Rx inhalers and Singulair .  H/O PE.  OAC=Coumadin with no current bleeding difficulties.  Environmental allergies and allergic rhinitis Rx Xyzal  and Nasal Inhaler.  No acute rash.  Chronic pain with h/o Sciatica, Rx Lyrica , Ultram .  No acute musculoskeletal complaints.  Chronic Rx Primidone, Sertraline , Mirtazapine, Buspar .  No acute neurologic complaints.  Minimal exercise tolerance with debility and history of fall, wheelchair.  No paroxymal nocturnal dyspnea.  No chest pain.  No overt syncope or presyncope.  No acute endocrinologic complaint.  No  heat or cold intolerance.  PEx: BP 100/60   Pulse 64   Resp 17   Ht 162.6 cm (5' 4)   Wt 61.6 kg (135 lb 12.8 oz)   SpO2 99%   BMI 23.31 kg/m  Gen:  Alert and Oriented, no apparent distress HEENT:  Normocephalic, atraumatic, extraocular eye movements intact Neck:  Supple without  appreciable lymphadenopathy or thyromegaly, no jugular venous distention on examination when sitting upright  Pulm:  Breath sounds are clear to auscultation bilaterally Abd:  Soft, nontender, nondistended with bowel sounds present Card:  Regular S1 and S2, no rub, no gallop, no murmur Skin:  No acute rash, LEFT sided Pacemaker Pocket low and lateral with palpable lead over generator, no visible or palpable abnormalities to suggest infection or impending erosion. Neuro:  Interactive with appropriate responses, wheelchair Muskuloskeletal:  No acute findings, Courtney Rogers  presents to electrophysiology clinic in wheelchair  Physical Exam VITALS: P- 64  Labs: No results found for: WBC, HGB, HCT, MCV, PLT   Chemistry   No results found for: NA, K, CL, CO2, BUN, CREATININE, GLUCOSE No results found for: CALCIUM, ALKPHOS, AST, ALT, TBILI    No results found for: ALT, AST, GGT, ALKPHOS, BILITOT No results found for: INR, PROTIME No results found for: TSH, T3TOTAL, T4TOTAL, THYROIDAB  Results LABS Atrial impedance: 418 ohms (01/01/2024) Right ventricular pacing impedance: 608 ohms (01/01/2024) Atrial pacing threshold: 0.625 volts at 0.4 milliseconds (01/01/2024) Right ventricular pacing threshold: 1.375 volts at 0.4 milliseconds (01/01/2024) P wave amplitude: 2.5 millivolts (01/01/2024) R wave amplitude: 13.8 millivolts (01/01/2024) Ventricular pacing percentage: 100.0% (01/01/2024) Atrial pacing percentage: 38.6% (01/01/2024) Atrial fibrillation percentage: 0% (01/01/2024) QRS duration: 212 milliseconds (01/01/2024) QTC interval: 511 milliseconds (01/01/2024)  RADIOLOGY Chest x-ray: Left sided generator site, header facing laterally, right atrial lead in right atrial appendage region, right ventricle lead in right ventricular apical position (01/01/2024)  DIAGNOSTIC Twelve lead EKG: Atrial sensed and ventricularly paced  complexes, morphology and axis consistent with right ventricular apical position (01/01/2024)  Impression and Plan: Courtney Rogers is a 73 y.o. female with PENICILLIN ALLERGY (Augmentin by Hx), CLARITHROMYCIN ALLERGY (per chart), LISINOPRIL ALLERGY (per chart), HTN, HLD, Asthma, Debility (h/o Fall, chronic pain, Wheelchair, Courtney Rogers), Multiple Myeloma (Rx Velcade ), h/o Obesity/OSA-->Body mass index is 23.31 kg/m., h/o PE (per notes from Dr. Ailene); OAC=Coumadin, Complete Heart Block (PACEMAKER DEPENDENT) and Sick Sinus Syndrome with ParoxAF (OAC=Coumadin) w/ LEFT sided MDT dPM (5076/5076; Dr. Cleotilde 04/07/2014) at recommended replacement time.  ParoxAF OAC=Coumadin (CHADSvasc= 4 htn, age, gender); RATE=Metop50qday (CHB, PM-DEPENDENT); AAD=None.  12 lead electrocardiogram 01/01/2024= SR AS/AP-RVP 64bpm, RVA paced QRS=250msec, QTc-568msec.  EF=50-55% with grade I diastolic dysfunction by TTE 10/01/2014.  The risks, benefits and alternatives to device generator changeout were reviewed along with procedural description.        Following review of the above, Courtney Rogers expressed wishes in favor of LEFT sided Medtronic dual chamber pacemaker generator changeout at [facility] 01/15/2024.    Assessment & Plan Pacemaker battery at recommended replacement time in pacemaker-dependent complete heart block Pacemaker battery reached replacement time. Device and lead function stable. Pacemaker-dependent due to complete heart block. Informed consent obtained for generator replacement, risks of infection and bleeding discussed. - Schedule pacemaker generator replacement procedure, [facility] 01/15/2024. - Reduce Coumadin dose by half for three days prior to procedure, INR on AM prior to procedure. - Instruct no food after midnight before procedure. - Perform procedure in afternoon [facility], monitor post-procedure  for medication clearance. - Instruct to  keep incision site dry for two weeks post-procedure. - Arrange follow-up with Doctor Rogers approximately two weeks post-procedure to assess healing and subsequently for pacemaker follow up. - Provide post-procedure instructions in writing.   Following device generator changeout, Courtney Rogers will need 2wk wound check and subsequent q3-21mos device checks with Dr. Ailene.  Both Courtney Rogers and Dr. Ailene have our contact information if we can be of any assistance in the future.    Pre-procedure Details - Generator Change MEDTRONIC Dual chamber Pacemaker; LEFT; Vancomycin; CHB (PM DEPENDENT); h/o PCN ALLERGY, CLARITHROMYCIN ALLERGY; Implant from Dr. 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 Ailene Boozer procedure: Remove and insert pacemaker generator; dual lead system (CPT [id number]) Device type: dual chamber pacemaker Device side:  left Device Vendor: Medtronic Referring MD: See note header Primary Cardiologist: See note header Primary Electrophysiologist: See note header Procedure Date: 01/15/2024 [facility] Medically necessary for first case: No Procedure Location: [facility] Patient preference to move up procedure date if available due to cancellation: No  Follow-up appointment: As scheduled above Device Follow-up Physician: Dr. Ailene Pre-procedural antibiotic prohylaxis: Vancomycin Admission Status: same day discharge Procedure Status: elective Anesthesia Plan: Moderate Sedation Urinary Catheter: None Preoperative Evaluation: [facility] Same Day Surgery  Preoperative labs: To be done at pre-operative Visit Basic Metabolic Profile, CBC, Chest x-ray, PT with INR, aPTT, and Magnesium  Contrast Allergy Premedication: No premedication necessary  Pre-procedure  medication plans: Anticoagulant Plan Warfarin, reduce dose by 50% for 3days prior to procedure, INR in AM prior to procedure Antiarrhythmic Plan Not on antiarrhythmic medications Hypoglycemic medications protocol None GLP-1 and GLP-1/GIP agonists - If the patient is on a GLP-1 agonist (eg. semaglutide, dulaglutide, etc.) or a GLP-1/GIP agonist (tirzepatide) please have patient take last dose > 5 days prior to the procedure and maintain a clear liquid diet the day prior to the procedure.  (None) Diuretics - if on diuretic, hold day of procedure  (chlorthalidone) AV nodal Medications Beta-blockers - do not hold  (metoprolol ) Diltiazem  - None Additional medications to hold None  Post-implant medication plan is: Anti-coagulation:  warfarin continuation (dose pending pre-procedure INR and pocket appearance; ParoxAF CHA2DS2vascF =  4 htn, 73 y.o., gender; h/o PE [per chart])    Diagnoses and all orders for this visit:  Pacemaker at end of battery life  CHB (complete heart block) (CMS/HHS-HCC)  Paroxysmal atrial fibrillation (CMS/HHS-HCC)  SSS (sick sinus syndrome) (CMS/HHS-HCC)  Anticoagulation goal of INR 2 to 3  Pacemaker-dependent due to native cardiac rhythm insufficient to support life  Cardiac pacemaker in situ  Fitting and adjustment of cardiac pacemaker  H/O echocardiogram  H/O electrocardiogram  Debility  Dependence on wheelchair  Multiple myeloma, remission status unspecified (CMS/HHS-HCC)  BMI 23.0-23.9, adult     Portions of this note has been created using automated tools and reviewed for accuracy by DONALD D HEGLAND.

## 2024-01-02 ENCOUNTER — Other Ambulatory Visit: Payer: Self-pay

## 2024-01-02 DIAGNOSIS — C9000 Multiple myeloma not having achieved remission: Secondary | ICD-10-CM

## 2024-01-03 ENCOUNTER — Other Ambulatory Visit: Payer: Self-pay

## 2024-01-03 ENCOUNTER — Inpatient Hospital Stay: Attending: Hematology

## 2024-01-03 ENCOUNTER — Inpatient Hospital Stay

## 2024-01-03 ENCOUNTER — Inpatient Hospital Stay: Admitting: Oncology

## 2024-01-03 VITALS — BP 120/71 | HR 64 | Temp 97.8°F | Resp 16 | Wt 134.6 lb

## 2024-01-03 DIAGNOSIS — D72819 Decreased white blood cell count, unspecified: Secondary | ICD-10-CM | POA: Insufficient documentation

## 2024-01-03 DIAGNOSIS — C9000 Multiple myeloma not having achieved remission: Secondary | ICD-10-CM | POA: Diagnosis present

## 2024-01-03 DIAGNOSIS — Z79899 Other long term (current) drug therapy: Secondary | ICD-10-CM | POA: Diagnosis not present

## 2024-01-03 DIAGNOSIS — Z7901 Long term (current) use of anticoagulants: Secondary | ICD-10-CM | POA: Insufficient documentation

## 2024-01-03 DIAGNOSIS — F339 Major depressive disorder, recurrent, unspecified: Secondary | ICD-10-CM

## 2024-01-03 DIAGNOSIS — E876 Hypokalemia: Secondary | ICD-10-CM | POA: Diagnosis not present

## 2024-01-03 DIAGNOSIS — Z5112 Encounter for antineoplastic immunotherapy: Secondary | ICD-10-CM | POA: Insufficient documentation

## 2024-01-03 DIAGNOSIS — Z86711 Personal history of pulmonary embolism: Secondary | ICD-10-CM | POA: Insufficient documentation

## 2024-01-03 DIAGNOSIS — D509 Iron deficiency anemia, unspecified: Secondary | ICD-10-CM

## 2024-01-03 DIAGNOSIS — G629 Polyneuropathy, unspecified: Secondary | ICD-10-CM | POA: Diagnosis not present

## 2024-01-03 DIAGNOSIS — Z79624 Long term (current) use of inhibitors of nucleotide synthesis: Secondary | ICD-10-CM | POA: Diagnosis not present

## 2024-01-03 LAB — COMPREHENSIVE METABOLIC PANEL WITH GFR
ALT: 24 U/L (ref 0–44)
AST: 25 U/L (ref 15–41)
Albumin: 3.4 g/dL — ABNORMAL LOW (ref 3.5–5.0)
Alkaline Phosphatase: 53 U/L (ref 38–126)
Anion gap: 9 (ref 5–15)
BUN: 26 mg/dL — ABNORMAL HIGH (ref 8–23)
CO2: 25 mmol/L (ref 22–32)
Calcium: 8.8 mg/dL — ABNORMAL LOW (ref 8.9–10.3)
Chloride: 106 mmol/L (ref 98–111)
Creatinine, Ser: 0.85 mg/dL (ref 0.44–1.00)
GFR, Estimated: >60 mL/min (ref >60)
Glucose, Bld: 85 mg/dL (ref 70–99)
Potassium: 3.5 mmol/L (ref 3.5–5.1)
Sodium: 140 mmol/L (ref 135–145)
Total Bilirubin: 0.6 mg/dL (ref 0.0–1.2)
Total Protein: 7.7 g/dL (ref 6.5–8.1)

## 2024-01-03 LAB — CBC WITH DIFFERENTIAL/PLATELET
Abs Immature Granulocytes: 0 K/uL (ref 0.00–0.07)
Basophils Absolute: 0.1 K/uL (ref 0.0–0.1)
Basophils Relative: 3 %
Eosinophils Absolute: 0.1 K/uL (ref 0.0–0.5)
Eosinophils Relative: 2 %
HCT: 35 % — ABNORMAL LOW (ref 36.0–46.0)
Hemoglobin: 11.3 g/dL — ABNORMAL LOW (ref 12.0–15.0)
Immature Granulocytes: 0 %
Lymphocytes Relative: 31 %
Lymphs Abs: 1 K/uL (ref 0.7–4.0)
MCH: 33.8 pg (ref 26.0–34.0)
MCHC: 32.3 g/dL (ref 30.0–36.0)
MCV: 104.8 fL — ABNORMAL HIGH (ref 80.0–100.0)
Monocytes Absolute: 0.5 K/uL (ref 0.1–1.0)
Monocytes Relative: 16 %
Neutro Abs: 1.6 K/uL — ABNORMAL LOW (ref 1.7–7.7)
Neutrophils Relative %: 48 %
Platelets: 150 K/uL (ref 150–400)
RBC: 3.34 MIL/uL — ABNORMAL LOW (ref 3.87–5.11)
RDW: 16 % — ABNORMAL HIGH (ref 11.5–15.5)
WBC: 3.4 K/uL — ABNORMAL LOW (ref 4.0–10.5)
nRBC: 0 % (ref 0.0–0.2)

## 2024-01-03 MED ORDER — DEXAMETHASONE 4 MG PO TABS
20.0000 mg | ORAL_TABLET | Freq: Once | ORAL | Status: AC
  Administered 2024-01-03 (×2): 20 mg via ORAL
  Filled 2024-01-03: qty 5

## 2024-01-03 MED ORDER — PROCHLORPERAZINE MALEATE 10 MG PO TABS
10.0000 mg | ORAL_TABLET | Freq: Once | ORAL | Status: AC
  Administered 2024-01-03 (×2): 10 mg via ORAL
  Filled 2024-01-03: qty 1

## 2024-01-03 MED ORDER — DARATUMUMAB-HYALURONIDASE-FIHJ 1800-30000 MG-UT/15ML ~~LOC~~ SOLN
1800.0000 mg | Freq: Once | SUBCUTANEOUS | Status: AC
  Administered 2024-01-03 (×2): 1800 mg via SUBCUTANEOUS
  Filled 2024-01-03: qty 15

## 2024-01-03 MED ORDER — CETIRIZINE HCL 10 MG PO TABS
10.0000 mg | ORAL_TABLET | Freq: Once | ORAL | Status: AC
  Administered 2024-01-03 (×2): 10 mg via ORAL
  Filled 2024-01-03: qty 1

## 2024-01-03 MED ORDER — ACETAMINOPHEN 325 MG PO TABS
650.0000 mg | ORAL_TABLET | Freq: Once | ORAL | Status: AC
  Administered 2024-01-03 (×2): 650 mg via ORAL
  Filled 2024-01-03: qty 2

## 2024-01-03 MED ORDER — BORTEZOMIB CHEMO SQ INJECTION 3.5 MG (2.5MG/ML)
0.7000 mg/m2 | Freq: Once | INTRAMUSCULAR | Status: AC
  Administered 2024-01-03 (×2): 1.25 mg via SUBCUTANEOUS
  Filled 2024-01-03: qty 0.5

## 2024-01-03 MED ORDER — DENOSUMAB 120 MG/1.7ML ~~LOC~~ SOLN
120.0000 mg | Freq: Once | SUBCUTANEOUS | Status: AC
  Administered 2024-01-03 (×2): 120 mg via SUBCUTANEOUS
  Filled 2024-01-03: qty 1.7

## 2024-01-03 NOTE — Progress Notes (Signed)
 Patient presents today for Darzalex  Faspro and Velcade  injection. MD appointment with Dr. Davonna rescheduled. Message received to proceed with treatment.   Patient meets treatment parameters. Pre-treatment check list completed prior to release. Patient taking Revlimid  as prescribed with no side effects noted.

## 2024-01-03 NOTE — Patient Instructions (Signed)
 [facility] [place] - A DEPT OF [place]  Discharge Instructions: Thank you for choosing [facility] to provide your oncology and hematology care.  If you have a lab appointment with the Cancer Center - please note that after April 8th, 2024, all labs will be drawn in the cancer center.  You do not have to check in or register with the main entrance as you have in the past but will complete your check-in in the cancer center.  Wear comfortable clothing and clothing appropriate for easy access to any Portacath or PICC line.   We strive to give you quality time with your provider. You may need to reschedule your appointment if you arrive late (15 or more minutes).  Arriving late affects you and other patients whose appointments are after yours.  Also, if you miss three or more appointments without notifying the office, you may be dismissed from the clinic at the provider's discretion.      For prescription refill requests, have your pharmacy contact our office and allow 72 hours for refills to be completed.    Today you received the following chemotherapy and/or immunotherapy agents Darzalex  faspro, velcade , xgeva  injection   To help prevent nausea and vomiting after your treatment, we encourage you to take your nausea medication as directed.  BELOW ARE SYMPTOMS THAT SHOULD BE REPORTED IMMEDIATELY: *FEVER GREATER THAN 100.4 F (38 C) OR HIGHER *CHILLS OR SWEATING *NAUSEA AND VOMITING THAT IS NOT CONTROLLED WITH YOUR NAUSEA MEDICATION *UNUSUAL SHORTNESS OF BREATH *UNUSUAL BRUISING OR BLEEDING *URINARY PROBLEMS (pain or burning when urinating, or frequent urination) *BOWEL PROBLEMS (unusual diarrhea, constipation, pain near the anus) TENDERNESS IN MOUTH AND THROAT WITH OR WITHOUT PRESENCE OF ULCERS (sore throat, sores in mouth, or a toothache) UNUSUAL RASH, SWELLING OR PAIN  UNUSUAL VAGINAL DISCHARGE OR ITCHING   Items with * indicate a potential  emergency and should be followed up as soon as possible or go to the Emergency Department if any problems should occur.  Please show the CHEMOTHERAPY ALERT CARD or IMMUNOTHERAPY ALERT CARD at check-in to the Emergency Department and triage nurse.  Should you have questions after your visit or need to cancel or reschedule your appointment, please contact [facility]  - A DEPT OF [facility] [phone]  and follow the prompts.  Office hours are 8:00 a.m. to 4:30 p.m. Monday - Friday. Please note that voicemails left after 4:00 p.m. may not be returned until the following business day.  We are closed weekends and major holidays. You have access to a nurse at all times for urgent questions. Please call the main number to the clinic [phone] and follow the prompts.  For any non-urgent questions, you may also contact your provider using MyChart. We now offer e-Visits for anyone 49 and older to request care online for non-urgent symptoms. For details visit [email or web address].   Also download the MyChart app! Go to the app store, search MyChart, open the app, select , and log in with your MyChart username and password.

## 2024-01-03 NOTE — Progress Notes (Signed)
 MD adding Darzalex  Faspro back into treatment plan every 2 weeks with Velcade .  Order received to add the following premedication into the plan:  Tylenol  650 mg po x 1 Cetirizine  10 mg po x 1 Dexamethasone  20 mg po x 1  Orders and labs updated.  V.O. Dr Ivery Molt, PharmD

## 2024-01-03 NOTE — Progress Notes (Signed)
Treatment given per orders. Patient tolerated it well without problems. Vitals stable and discharged home from clinic via wheelchair Follow up as scheduled.  

## 2024-01-04 ENCOUNTER — Ambulatory Visit: Payer: Self-pay

## 2024-01-04 ENCOUNTER — Encounter (HOSPITAL_COMMUNITY)
Admission: RE | Admit: 2024-01-04 | Discharge: 2024-01-04 | Disposition: A | Discharge status: Home / Self Care | Source: Ambulatory Visit | Attending: Hematology | Admitting: Hematology

## 2024-01-04 DIAGNOSIS — C9000 Multiple myeloma not having achieved remission: Secondary | ICD-10-CM | POA: Insufficient documentation

## 2024-01-04 MED ORDER — FLUDEOXYGLUCOSE F - 18 (FDG) INJECTION
6.7300 | Freq: Once | INTRAVENOUS | Status: AC | PRN
  Administered 2024-01-04: 6.73 via INTRAVENOUS

## 2024-01-08 ENCOUNTER — Other Ambulatory Visit: Payer: Self-pay

## 2024-01-09 ENCOUNTER — Ambulatory Visit (HOSPITAL_COMMUNITY)
Admission: RE | Admit: 2024-01-09 | Discharge: 2024-01-09 | Disposition: A | Discharge status: Home / Self Care | Source: Ambulatory Visit

## 2024-01-09 DIAGNOSIS — Z780 Asymptomatic menopausal state: Secondary | ICD-10-CM | POA: Insufficient documentation

## 2024-01-11 ENCOUNTER — Other Ambulatory Visit: Payer: Self-pay

## 2024-01-11 DIAGNOSIS — D509 Iron deficiency anemia, unspecified: Secondary | ICD-10-CM

## 2024-01-11 DIAGNOSIS — C9000 Multiple myeloma not having achieved remission: Secondary | ICD-10-CM

## 2024-01-11 NOTE — Progress Notes (Signed)
 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 female was transferred to my care after her prior physician has left.   ASSESSMENT & PLAN:   Assessment & Plan: Courtney Rogers  is a 73 y.o. female with stage II IgA kappa multiple myeloma, high risk with del 17p  Assessment & Plan Multiple myeloma not having achieved remission (HCC) Patient with high risk multiple myeloma with 7P deletion. S/p induction with Dara VRD.  Patient refused transplantation as per documentation Maintenance on Velcade  and Revlimid  with recent worsening of multiple myeloma labs and was restarted on daratumumab  Currently on daratumumab , Velcade  and Revlimid   - Reviewed labs today.  Multiple myeloma labs not available from recent blood draw.  CBC: Mild leukopenia, no anemia, thrombocytopenia with a platelets of 115.  CMP: Hypokalemia with a potassium of 2.9.  Creatinine: Normal - Continue daratumumab , Velcade  and Revlimid  as scheduled. Can treat if labs are within parameters.  Scheduled on 01/17/2024. - Will draw multiple myeloma labs today. - Continue acyclovir  twice daily  Return to clinic prior to next cycle of Dara VRD. 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 Pulmonary embolism, other, unspecified chronicity, unspecified whether acute cor pulmonale present (HCC) Patient has a history of PE and is on Coumadin  - Considering patient is on Revlimid , high risk of thrombosis, will  need indefinite anticoagulation - Continue Coumadin as prescribed Hypokalemia Patient has a history of hypokalemia and is taking potassium supplement Potassium today is 2.9  - Unsure if patient is taking it consistently -Recommended taking potassium supplements consistently. - Will repeat labs on 01/17/2024    The patient understands the plans discussed today and is in agreement with them.  She knows to contact our office if she develops concerns prior to her next appointment.  60 minutes of total time was spent for this patient encounter, including preparation, face-to-face counseling with the patient and coordination of care, physical exam, and documentation of the encounter. > 50% of the time was spent on counseling as documented under my assessment and plan.    LILLETTE Verneta SAUNDERS Teague,acting as a Neurosurgeon for Courtney Dry, MD.,have documented all relevant documentation on the behalf of Courtney Dry, MD,as directed by  Courtney Dry, MD while in the presence of Courtney Dry, MD.  I, Courtney Dry MD, have reviewed the above documentation for accuracy and completeness, and I agree with the above.     Courtney Dry, MD  [facility] [place] - A DEPT OF [facility] [address] [place] [address] Dept: [phone] Dept Fax: [phone]   Orders Placed This Encounter  Procedures   Lactate dehydrogenase    Standing Status:   Future    Number of Occurrences:   1    Expected Date:   01/12/2024    Expiration Date:   04/11/2024   Kappa/lambda light chains    Standing Status:   Future    Number of Occurrences:   1    Expected Date:  01/12/2024    Expiration Date:   04/11/2024   Immunofixation electrophoresis    Standing Status:   Future    Number of Occurrences:   1    Expected Date:   01/12/2024    Expiration Date:   04/11/2024   Protein electrophoresis, serum    Standing Status:   Future    Number of  Occurrences:   1    Expected Date:   01/12/2024    Expiration Date:   04/11/2024     ONCOLOGY HISTORY:   I have reviewed her chart and materials related to her cancer extensively and collaborated history with the patient. 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 IFE: biclonal IgA protein with kappa specificity. NO CRAB criteria -06/25/2019: FLC ratio 4.80. LDH 207. Beta-2  microglobulin 2.8. M-spike 2.5.IFE:  No CRAB criteria -07/09/2019: Bone Marrow Biopsy.  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  -07/09/2019: Initial PET: No findings of active malignancy  -08/20/2019: SPEP:M spike: 2.3. Hb:10.1, Normal Creatinine and calcium -02/07/2020: DXA scan with T-score -0.5.  -06/09/2021: Hb:10.8, Cr:1.05, Ca:10.2, LDH:135, M spike:3.5. FLC ratio: 17.43 (Worsening myeloma panel) -07/12/2021: Bone marrow Biopsy.  -Pathology: Hypercellular bone marrow with kappa light chain restricted plasma cell neoplasm representing 30% of all cells.  -FISH: Gain of 1 q. (high risk), T p53 deletion (high risk), monosomy 13 (standard risk)  -Cytogenetics: Complex karyotype.  -06/24/2021: PET: Progressive myeloma, with several scattered hypermetabolic skeletal lesions including the bony pelvis, left mid humerus, bilateral mid femurs, right superior pubic ramus, and left calvarium. -07/29/2021: 2D echo:  EF 50-55%. There is distal septal and inferior apical hypokinesis.  -08/10/2021-11/30/2021: 6 cycles of Dara VRD -09/09/2021: Patient [facility] with Dr. Darwyn at [facility] for [facility] marrow transplant.  As per documentation by her previous oncologist on 10/19/2021, patient refused pursuing transplant option. -12/07/2021: Myeloma panel: M-spike 0.2g and normal FLC  ratio. Negative DIRA test. -12/20/2021 -Current: Maintenance Velcade  0.7 mg/m every 2 weeks and Revlimid  3 weeks on/1 week off -08/10/2023: Revlimid  dose increased to 10 mg 3 weeks on/1 week off due to increased M spike of 0.4 (missed Revlimid  in 10/2023) -09/2023-12/2023: Worsening MM labs- M spike and FLC -01/03/2024: Daratumumab  added to regimen due worsening myeloma labs -01/04/2024: PET: No evidence of active myeloma. No evidence of plasmacytoma.    Current Treatment:  Daratumumab  (monthly) + Velcade (0.7mg /m2 every 2 weeks) + Revlimid  (10mg  3 weeks on/one week off)  INTERVAL HISTORY:   MALEENA EDDLEMAN is here today for follow up.  Patient was unaccompanied today.  She reports feeling tired, some numbness and tingling in her hands.  She reports taking Revlimid  consistently and has no complaints.  Her appetite is good.   I have reviewed the past medical history, past surgical history, social history and family history with the patient and they are unchanged from previous note.  ALLERGIES:  is allergic to amoxicillin-pot clavulanate, clarithromycin, erythromycin, lisinopril, and amoxicillin.  MEDICATIONS:  Current Outpatient Medications  Medication Sig Dispense Refill   acetaminophen  (TYLENOL ) 500 MG tablet Take 1,000 mg by mouth 2 (two) times daily as needed for moderate pain or headache.     acyclovir  (ZOVIRAX ) 400 MG tablet TAKE ONE TABLET BY MOUTH TWICE DAILY 60 tablet 4   amLODipine  (NORVASC ) 10 MG tablet  Take 10 mg by mouth.     ASPIRIN LOW DOSE 81 MG tablet Take 81 mg by mouth daily.     B Complex-Folic Acid  (B COMPLEX VITAMINS, W/ FA,) CAPS Take 1 capsule by mouth daily.     busPIRone  (BUSPAR ) 5 MG tablet Take 5 mg by mouth daily.     Calcium Carb-Cholecalciferol 600-10 MG-MCG TABS Take 1 tablet by mouth 2 (two) times daily.     Calcium Carbonate-Vitamin D  (CALCIUM 600+D PO) Take 1 tablet by mouth 2 (two) times daily.     citalopram  (CELEXA ) 20 MG tablet TAKE ONE TABLET BY  MOUTH EVERY DAY 30 tablet 3   cyanocobalamin  (VITAMIN B12) 1000 MCG tablet Take 1,000 mcg by mouth daily.     diclofenac  Sodium (VOLTAREN ) 1 % GEL Apply 4 g topically 4 (four) times daily. 50 g 0   dicyclomine  (BENTYL ) 10 MG capsule TAKE ONE CAPSULE BY MOUTH THREE TIMES A DAY BEFORE meals 90 capsule 2   doxycycline  (VIBRA -TABS) 100 MG tablet      FEROSUL 325 (65 Fe) MG tablet Take 325 mg by mouth daily.     fluticasone  (FLONASE ) 50 MCG/ACT nasal spray USE 2 SPRAYS IN EACH NOSTRIL ONCE DAILY 16 g 0   folic acid  (FOLVITE ) 1 MG tablet Take 1 mg by mouth daily.     GNP VITAMIN C 500 MG tablet Take 500 mg by mouth daily.     lenalidomide  (REVLIMID ) 10 MG capsule Take 1 capsule (10 mg total) by mouth daily. 21 days on, 7 days off every 28 days 21 capsule 0   levocetirizine (XYZAL ) 5 MG tablet TAKE ONE TABLET BY MOUTH IN THE EVENING 30 tablet 3   lidocaine  (LIDODERM ) 5 % Place 1 patch onto the skin daily. Remove & Discard patch within 12 hours or as directed by MD 30 patch 0   magnesium  oxide (MAG-OX) 400 (240 Mg) MG tablet TAKE ONE TABLET BY MOUTH TWICE DAILY 60 tablet 4   metoprolol  tartrate (LOPRESSOR ) 50 MG tablet Take 1 tablet (50 mg total) by mouth 2 (two) times daily. 180 tablet 1   montelukast  (SINGULAIR ) 10 MG tablet TAKE ONE TABLET BY MOUTH AT BEDTIME 30 tablet 3   MYRBETRIQ  50 MG TB24 tablet TAKE ONE TABLET BY MOUTH ONCE DAILY 30 tablet 3   NICODERM CQ 14 MG/24HR patch 14 mg daily.     nitroGLYCERIN (NITROSTAT) 0.4 MG SL tablet Place 0.4 mg under the tongue every 5 (five) minutes as needed for chest pain.     pantoprazole  (PROTONIX ) 40 MG tablet TAKE ONE TABLET BY MOUTH TWICE DAILY 90 tablet 3   potassium chloride  SA (KLOR-CON  M) 20 MEQ tablet TAKE ONE TABLET BY MOUTH TWICE DAILY 60 tablet 3   pregabalin  (LYRICA ) 50 MG capsule TAKE ONE CAPSULE BY MOUTH TWICE DAILY 60 capsule 4   primidone (MYSOLINE) 50 MG tablet TAKE ONE TABLET BY MOUTH TWICE DAILY 60 tablet 5   Probiotic Product (GNP  PROBIOTIC COLON SUPPORT) CAPS Take 1 capsule by mouth daily.     sertraline  (ZOLOFT ) 50 MG tablet TAKE ONE TABLET BY MOUTH ONCE DAILY 30 tablet 3   simvastatin  (ZOCOR ) 20 MG tablet Take 20 mg by mouth at bedtime.     topiramate (TOPAMAX) 25 MG tablet Take 25 mg by mouth 2 (two) times daily.     traMADol  (ULTRAM ) 50 MG tablet Take 1 tablet (50 mg total) by mouth every 6 (six) hours as needed. 20 tablet 0   warfarin (  COUMADIN) 3 MG tablet Take 3 mg by mouth daily.     warfarin (COUMADIN) 6 MG tablet Take 6 mg by mouth at bedtime.     No current facility-administered medications for this visit.    REVIEW OF SYSTEMS:   Constitutional: Denies fevers, chills or abnormal weight loss Eyes: Denies blurriness of vision Ears, nose, mouth, throat, and face: Denies mucositis or sore throat Respiratory: Denies cough, dyspnea or wheezes Cardiovascular: Denies palpitation, chest discomfort or lower extremity swelling Gastrointestinal:  Denies nausea, heartburn or change in bowel habits Skin: Denies abnormal skin rashes Lymphatics: Denies new lymphadenopathy or easy bruising Neurological:Denies numbness, tingling or new weaknesses Behavioral/Psych: Mood is stable, no new changes  All other systems were reviewed with the patient and are negative.   VITALS:  Blood pressure 99/62, pulse 62, temperature 97.7 F (36.5 C), temperature source Oral, resp. rate 18, weight 134 lb 6.4 oz (61 kg), SpO2 100%.  Wt Readings from Last 3 Encounters:  01/12/24 134 lb 6.4 oz (61 kg)  01/03/24 134 lb 9.6 oz (61.1 kg)  12/27/23 133 lb (60.3 kg)    Body mass index is 23.07 kg/m.  Performance status (ECOG): 2 - Symptomatic, <50% confined to bed  PHYSICAL EXAM:   GENERAL:alert, no distress and comfortable SKIN: skin color, texture, turgor are normal, no rashes or significant lesions EYES: normal, Conjunctiva are pink and non-injected, sclera clear LYMPH:  no palpable lymphadenopathy in the cervical, axillary or  inguinal LUNGS: clear to auscultation and percussion with normal breathing effort HEART: regular rate & rhythm and no murmurs and no lower extremity edema ABDOMEN:abdomen soft, non-tender and normal bowel sounds Musculoskeletal:no cyanosis of digits and no clubbing  NEURO: alert & oriented x 3 with fluent speech, no focal motor/sensory deficits  LABORATORY DATA:  I have reviewed the data as listed     Component Value Date/Time   NA 140 01/12/2024 0929   NA 141 03/21/2023 1115   K 2.9 (L) 01/12/2024 0929   CL 105 01/12/2024 0929   CO2 25 01/12/2024 0929   GLUCOSE 70 01/12/2024 0929   BUN 22 01/12/2024 0929   BUN 20 03/21/2023 1115   CREATININE 0.94 01/12/2024 0929   CREATININE 0.74 09/24/2013 1119   CALCIUM 8.5 (L) 01/12/2024 0929   PROT 7.4 01/12/2024 0929   PROT 6.7 02/24/2023 1121   ALBUMIN 3.6 01/12/2024 0929   ALBUMIN 4.4 02/24/2023 1121   AST 23 01/12/2024 0929   ALT 23 01/12/2024 0929   ALKPHOS 51 01/12/2024 0929   BILITOT 0.6 01/12/2024 0929   BILITOT 0.4 02/24/2023 1121   GFRNONAA >60 01/12/2024 0929   GFRNONAA 86 09/24/2013 1119   GFRAA 80 07/15/2020 1113   GFRAA >89 09/24/2013 1119    Lab Results  Component Value Date   WBC 3.8 (L) 01/12/2024   NEUTROABS 2.2 01/12/2024   HGB 12.0 01/12/2024   HCT 36.6 01/12/2024   MCV 104.0 (H) 01/12/2024   PLT 115 (L) 01/12/2024     RADIOGRAPHIC STUDIES: I have personally reviewed the radiological images as listed and agreed with the findings in the report.  NM PET Image Restag (PS) Skull Base To Thigh CLINICAL DATA:  Subsequent treatment strategy for hematological malignancy. Myeloma restaging  EXAM: NUCLEAR MEDICINE PET SKULL BASE TO THIGH  TECHNIQUE: 6.7 mCi F-18 FDG was injected intravenously. Full-ring PET imaging was performed from the skull base to thigh after the radiotracer. CT data was obtained and used for attenuation correction and anatomic localization.  Fasting blood  glucose: 113  mg/dl  COMPARISON:  None Available.  FINDINGS: NECK: No hypermetabolic lymph nodes in the neck.  Incidental CT findings: None.  CHEST: No hypermetabolic mediastinal or hilar nodes. No suspicious pulmonary nodules on the CT scan.  Incidental CT findings: Chronic scarring in the RIGHT lower lobe. No measurable nodularity.  ABDOMEN/PELVIS: No abnormal hypermetabolic activity within the liver, pancreas, adrenal glands, or spleen. No hypermetabolic lymph nodes in the abdomen or pelvis.  Incidental CT findings: None.  SKELETON: No focal hypermetabolic activity to suggest active myeloma  Incidental CT findings: No suspicious lytic lesions in the CT portion exam. Vertebral body augmentation in the L1 vertebral body  IMPRESSION: 1. No evidence of active myeloma on FDG PET scan. 2. No evidence of plasmacytoma. 3. Chronic scarring in the RIGHT lower lobe.  Electronically Signed   By: Jackquline Boxer M.D.   On: 01/11/2024 09:27

## 2024-01-12 ENCOUNTER — Inpatient Hospital Stay

## 2024-01-12 ENCOUNTER — Inpatient Hospital Stay (HOSPITAL_BASED_OUTPATIENT_CLINIC_OR_DEPARTMENT_OTHER): Admitting: Oncology

## 2024-01-12 VITALS — BP 99/62 | HR 62 | Temp 97.7°F | Resp 18 | Wt 134.4 lb

## 2024-01-12 DIAGNOSIS — C9000 Multiple myeloma not having achieved remission: Secondary | ICD-10-CM

## 2024-01-12 DIAGNOSIS — D509 Iron deficiency anemia, unspecified: Secondary | ICD-10-CM

## 2024-01-12 DIAGNOSIS — G6289 Other specified polyneuropathies: Secondary | ICD-10-CM | POA: Diagnosis not present

## 2024-01-12 DIAGNOSIS — D72819 Decreased white blood cell count, unspecified: Secondary | ICD-10-CM | POA: Diagnosis not present

## 2024-01-12 DIAGNOSIS — Z79899 Other long term (current) drug therapy: Secondary | ICD-10-CM

## 2024-01-12 DIAGNOSIS — I2699 Other pulmonary embolism without acute cor pulmonale: Secondary | ICD-10-CM

## 2024-01-12 DIAGNOSIS — E876 Hypokalemia: Secondary | ICD-10-CM | POA: Diagnosis not present

## 2024-01-12 DIAGNOSIS — Z86711 Personal history of pulmonary embolism: Secondary | ICD-10-CM

## 2024-01-12 DIAGNOSIS — Z5112 Encounter for antineoplastic immunotherapy: Secondary | ICD-10-CM | POA: Diagnosis not present

## 2024-01-12 DIAGNOSIS — Z7901 Long term (current) use of anticoagulants: Secondary | ICD-10-CM

## 2024-01-12 LAB — CBC WITH DIFFERENTIAL/PLATELET
Abs Immature Granulocytes: 0.01 K/uL (ref 0.00–0.07)
Basophils Absolute: 0 K/uL (ref 0.0–0.1)
Basophils Relative: 1 %
Eosinophils Absolute: 0.3 K/uL (ref 0.0–0.5)
Eosinophils Relative: 9 %
HCT: 36.6 % (ref 36.0–46.0)
Hemoglobin: 12 g/dL (ref 12.0–15.0)
Immature Granulocytes: 0 %
Lymphocytes Relative: 24 %
Lymphs Abs: 0.9 K/uL (ref 0.7–4.0)
MCH: 34.1 pg — ABNORMAL HIGH (ref 26.0–34.0)
MCHC: 32.8 g/dL (ref 30.0–36.0)
MCV: 104 fL — ABNORMAL HIGH (ref 80.0–100.0)
Monocytes Absolute: 0.3 K/uL (ref 0.1–1.0)
Monocytes Relative: 7 %
Neutro Abs: 2.2 K/uL (ref 1.7–7.7)
Neutrophils Relative %: 59 %
Platelets: 115 K/uL — ABNORMAL LOW (ref 150–400)
RBC: 3.52 MIL/uL — ABNORMAL LOW (ref 3.87–5.11)
RDW: 16 % — ABNORMAL HIGH (ref 11.5–15.5)
WBC: 3.8 K/uL — ABNORMAL LOW (ref 4.0–10.5)
nRBC: 0 % (ref 0.0–0.2)

## 2024-01-12 LAB — COMPREHENSIVE METABOLIC PANEL WITH GFR
ALT: 23 U/L (ref 0–44)
AST: 23 U/L (ref 15–41)
Albumin: 3.6 g/dL (ref 3.5–5.0)
Alkaline Phosphatase: 51 U/L (ref 38–126)
Anion gap: 10 (ref 5–15)
BUN: 22 mg/dL (ref 8–23)
CO2: 25 mmol/L (ref 22–32)
Calcium: 8.5 mg/dL — ABNORMAL LOW (ref 8.9–10.3)
Chloride: 105 mmol/L (ref 98–111)
Creatinine, Ser: 0.94 mg/dL (ref 0.44–1.00)
GFR, Estimated: >60 mL/min (ref >60)
Glucose, Bld: 70 mg/dL (ref 70–99)
Potassium: 2.9 mmol/L — ABNORMAL LOW (ref 3.5–5.1)
Sodium: 140 mmol/L (ref 135–145)
Total Bilirubin: 0.6 mg/dL (ref 0.0–1.2)
Total Protein: 7.4 g/dL (ref 6.5–8.1)

## 2024-01-12 LAB — LACTATE DEHYDROGENASE: LDH: 140 U/L (ref 98–192)

## 2024-01-14 ENCOUNTER — Encounter (HOSPITAL_COMMUNITY): Payer: Self-pay | Admitting: Oncology

## 2024-01-14 ENCOUNTER — Encounter: Payer: Self-pay | Admitting: Oncology

## 2024-01-14 DIAGNOSIS — G629 Polyneuropathy, unspecified: Secondary | ICD-10-CM | POA: Insufficient documentation

## 2024-01-14 NOTE — Assessment & Plan Note (Signed)
 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 S/p induction with Dara VRD.  Patient refused transplantation as per documentation Maintenance on Velcade  and Revlimid  with recent worsening of multiple myeloma labs and was restarted on daratumumab  Currently on daratumumab , Velcade  and Revlimid   - Reviewed labs today.  Multiple myeloma labs not available from recent blood draw.  CBC: Mild leukopenia, no anemia, thrombocytopenia with a platelets of 115.  CMP: Hypokalemia with a potassium of 2.9.  Creatinine: Normal - Continue daratumumab , Velcade  and Revlimid  as scheduled. Can treat if labs are within parameters.  Scheduled on 01/17/2024. - Will draw multiple myeloma labs today. - Continue acyclovir  twice daily  Return to clinic prior to next cycle of Dara VRD.

## 2024-01-14 NOTE — Assessment & Plan Note (Addendum)
 Patient has a history of hypokalemia and is taking potassium supplement Potassium today is 2.9  - Unsure if patient is taking it consistently -Recommended taking potassium supplements consistently. - Will repeat labs on 01/17/2024

## 2024-01-14 NOTE — Assessment & Plan Note (Signed)
 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

## 2024-01-14 NOTE — Assessment & Plan Note (Addendum)
 Patient has a history of PE and is on Coumadin  - Considering patient is on Revlimid , high risk of thrombosis, will need indefinite anticoagulation - Continue Coumadin as prescribed

## 2024-01-15 LAB — PROTEIN ELECTROPHORESIS, SERUM
A/G Ratio: 1.2 (ref 0.7–1.7)
Albumin ELP: 3.8 g/dL (ref 2.9–4.4)
Alpha-1-Globulin: 0.2 g/dL (ref 0.0–0.4)
Alpha-2-Globulin: 0.7 g/dL (ref 0.4–1.0)
Beta Globulin: 1 g/dL (ref 0.7–1.3)
Gamma Globulin: 1.4 g/dL (ref 0.4–1.8)
Globulin, Total: 3.3 g/dL (ref 2.2–3.9)
M-Spike, %: 0.8 g/dL — ABNORMAL HIGH
Total Protein ELP: 7.1 g/dL (ref 6.0–8.5)

## 2024-01-15 LAB — KAPPA/LAMBDA LIGHT CHAINS
Kappa free light chain: 14.4 mg/L (ref 3.3–19.4)
Kappa, lambda light chain ratio: 1.37 (ref 0.26–1.65)
Lambda free light chains: 10.5 mg/L (ref 5.7–26.3)

## 2024-01-16 ENCOUNTER — Other Ambulatory Visit: Payer: Self-pay | Admitting: Oncology

## 2024-01-16 DIAGNOSIS — C9000 Multiple myeloma not having achieved remission: Secondary | ICD-10-CM

## 2024-01-16 LAB — IMMUNOFIXATION ELECTROPHORESIS
IgA: 1327 mg/dL — ABNORMAL HIGH (ref 64–422)
IgG (Immunoglobin G), Serum: 846 mg/dL (ref 586–1602)
IgM (Immunoglobulin M), Srm: 35 mg/dL (ref 26–217)
Total Protein ELP: 7.1 g/dL (ref 6.0–8.5)

## 2024-01-17 ENCOUNTER — Encounter: Payer: Self-pay | Admitting: Oncology

## 2024-01-17 ENCOUNTER — Inpatient Hospital Stay

## 2024-01-17 ENCOUNTER — Other Ambulatory Visit: Payer: Self-pay

## 2024-01-17 VITALS — BP 99/58 | HR 63 | Temp 96.0°F | Resp 18 | Wt 131.8 lb

## 2024-01-17 DIAGNOSIS — C9000 Multiple myeloma not having achieved remission: Secondary | ICD-10-CM

## 2024-01-17 DIAGNOSIS — Z5112 Encounter for antineoplastic immunotherapy: Secondary | ICD-10-CM | POA: Diagnosis not present

## 2024-01-17 DIAGNOSIS — E876 Hypokalemia: Secondary | ICD-10-CM

## 2024-01-17 LAB — CBC WITH DIFFERENTIAL/PLATELET
Abs Immature Granulocytes: 0.01 K/uL (ref 0.00–0.07)
Basophils Absolute: 0 K/uL (ref 0.0–0.1)
Basophils Relative: 0 %
Eosinophils Absolute: 0.5 K/uL (ref 0.0–0.5)
Eosinophils Relative: 9 %
HCT: 35.5 % — ABNORMAL LOW (ref 36.0–46.0)
Hemoglobin: 11.5 g/dL — ABNORMAL LOW (ref 12.0–15.0)
Immature Granulocytes: 0 %
Lymphocytes Relative: 13 %
Lymphs Abs: 0.7 K/uL (ref 0.7–4.0)
MCH: 33.9 pg (ref 26.0–34.0)
MCHC: 32.4 g/dL (ref 30.0–36.0)
MCV: 104.7 fL — ABNORMAL HIGH (ref 80.0–100.0)
Monocytes Absolute: 0.3 K/uL (ref 0.1–1.0)
Monocytes Relative: 6 %
Neutro Abs: 3.9 K/uL (ref 1.7–7.7)
Neutrophils Relative %: 72 %
Platelets: 116 K/uL — ABNORMAL LOW (ref 150–400)
RBC: 3.39 MIL/uL — ABNORMAL LOW (ref 3.87–5.11)
RDW: 16.2 % — ABNORMAL HIGH (ref 11.5–15.5)
WBC: 5.4 K/uL (ref 4.0–10.5)
nRBC: 0 % (ref 0.0–0.2)

## 2024-01-17 LAB — COMPREHENSIVE METABOLIC PANEL WITH GFR
ALT: 20 U/L (ref 0–44)
AST: 20 U/L (ref 15–41)
Albumin: 3.5 g/dL (ref 3.5–5.0)
Alkaline Phosphatase: 53 U/L (ref 38–126)
Anion gap: 9 (ref 5–15)
BUN: 21 mg/dL (ref 8–23)
CO2: 25 mmol/L (ref 22–32)
Calcium: 8.2 mg/dL — ABNORMAL LOW (ref 8.9–10.3)
Chloride: 104 mmol/L (ref 98–111)
Creatinine, Ser: 0.84 mg/dL (ref 0.44–1.00)
GFR, Estimated: >60 mL/min (ref >60)
Glucose, Bld: 74 mg/dL (ref 70–99)
Potassium: 3.4 mmol/L — ABNORMAL LOW (ref 3.5–5.1)
Sodium: 138 mmol/L (ref 135–145)
Total Bilirubin: 0.7 mg/dL (ref 0.0–1.2)
Total Protein: 7.1 g/dL (ref 6.5–8.1)

## 2024-01-17 MED ORDER — PROCHLORPERAZINE MALEATE 10 MG PO TABS
10.0000 mg | ORAL_TABLET | Freq: Once | ORAL | Status: AC
  Administered 2024-01-17: 10 mg via ORAL
  Filled 2024-01-17: qty 1

## 2024-01-17 MED ORDER — DEXAMETHASONE 4 MG PO TABS
20.0000 mg | ORAL_TABLET | Freq: Once | ORAL | Status: AC
  Administered 2024-01-17: 20 mg via ORAL
  Filled 2024-01-17: qty 5

## 2024-01-17 MED ORDER — POTASSIUM CHLORIDE CRYS ER 20 MEQ PO TBCR
40.0000 meq | EXTENDED_RELEASE_TABLET | Freq: Once | ORAL | Status: AC
  Administered 2024-01-17: 40 meq via ORAL
  Filled 2024-01-17: qty 2

## 2024-01-17 MED ORDER — CETIRIZINE HCL 10 MG PO TABS
10.0000 mg | ORAL_TABLET | Freq: Once | ORAL | Status: AC
  Administered 2024-01-17: 10 mg via ORAL
  Filled 2024-01-17: qty 1

## 2024-01-17 MED ORDER — DARATUMUMAB-HYALURONIDASE-FIHJ 1800-30000 MG-UT/15ML ~~LOC~~ SOLN
1800.0000 mg | Freq: Once | SUBCUTANEOUS | Status: AC
  Administered 2024-01-17: 1800 mg via SUBCUTANEOUS
  Filled 2024-01-17: qty 15

## 2024-01-17 MED ORDER — LENALIDOMIDE 10 MG PO CAPS: ORAL_CAPSULE | ORAL | 0 refills | Status: DC

## 2024-01-17 MED ORDER — BORTEZOMIB CHEMO SQ INJECTION 3.5 MG (2.5MG/ML)
0.7000 mg/m2 | Freq: Once | INTRAMUSCULAR | Status: AC
  Administered 2024-01-17: 1.25 mg via SUBCUTANEOUS
  Filled 2024-01-17: qty 0.5

## 2024-01-17 MED ORDER — ACETAMINOPHEN 325 MG PO TABS
650.0000 mg | ORAL_TABLET | Freq: Once | ORAL | Status: AC
  Administered 2024-01-17: 650 mg via ORAL
  Filled 2024-01-17: qty 2

## 2024-01-17 NOTE — Patient Instructions (Signed)
 [facility] [place] - A DEPT OF [facility]  Discharge Instructions: Thank you for choosing [facility] to provide your oncology and hematology care.  If you have a lab appointment with the Cancer Center - please note that after April 8th, 2024, all labs will be drawn in the cancer center.  You do not have to check in or register with the main entrance as you have in the past but will complete your check-in in the cancer center.  Wear comfortable clothing and clothing appropriate for easy access to any Portacath or PICC line.   We strive to give you quality time with your provider. You may need to reschedule your appointment if you arrive late (15 or more minutes).  Arriving late affects you and other patients whose appointments are after yours.  Also, if you miss three or more appointments without notifying the office, you may be dismissed from the clinic at the provider's discretion.      For prescription refill requests, have your pharmacy contact our office and allow 72 hours for refills to be completed.    Today you received the following chemotherapy and/or immunotherapy agents Daratumumab/Velcade.  Daratumumab; Hyaluronidase Injection What is this medication? DARATUMUMAB; HYALURONIDASE (dar a toom ue mab; hye al ur ON i dase) treats multiple myeloma, a type of bone marrow cancer. Daratumumab works by blocking a protein that causes cancer cells to grow and multiply. This helps to slow or stop the spread of cancer cells. Hyaluronidase works by increasing the absorption of other medications in the body to help them work better. This medication may also be used treat amyloidosis, a condition that causes the buildup of a protein (amyloid) in your body. It works by reducing the buildup of this protein, which decreases symptoms. It is a combination medication that contains a monoclonal antibody. This medicine may be used for other purposes; ask your health care  provider or pharmacist if you have questions. COMMON BRAND NAME(S): DARZALEX FASPRO What should I tell my care team before I take this medication? They need to know if you have any of these conditions: Heart disease Infection, such as chickenpox, cold sores, herpes, hepatitis B Lung or breathing disease An unusual or allergic reaction to daratumumab, hyaluronidase, other medications, foods, dyes, or preservatives Pregnant or trying to get pregnant Breast-feeding How should I use this medication? This medication is injected under the skin. It is given by your care team in a hospital or clinic setting. Talk to your care team about the use of this medication in children. Special care may be needed. Overdosage: If you think you have taken too much of this medicine contact a poison control center or emergency room at once. NOTE: This medicine is only for you. Do not share this medicine with others. What if I miss a dose? Keep appointments for follow-up doses. It is important not to miss your dose. Call your care team if you are unable to keep an appointment. What may interact with this medication? Interactions have not been studied. This list may not describe all possible interactions. Give your health care provider a list of all the medicines, herbs, non-prescription drugs, or dietary supplements you use. Also tell them if you smoke, drink alcohol, or use illegal drugs. Some items may interact with your medicine. What should I watch for while using this medication? Your condition will be monitored carefully while you are receiving this medication. This medication can cause serious allergic  reactions. To reduce your risk, your care team may give you other medication to take before receiving this one. Be sure to follow the directions from your care team. This medication can affect the results of blood tests to match your blood type. These changes can last for up to 6 months after the final dose.  Your care team will do blood tests to match your blood type before you start treatment. Tell all of your care team that you are being treated with this medication before receiving a blood transfusion. This medication can affect the results of some tests used to determine treatment response; extra tests may be needed to evaluate response. Talk to your care team if you wish to become pregnant or think you are pregnant. This medication can cause serious birth defects if taken during pregnancy and for 3 months after the last dose. A reliable form of contraception is recommended while taking this medication and for 3 months after the last dose. Talk to your care team about effective forms of contraception. Do not breast-feed while taking this medication. What side effects may I notice from receiving this medication? Side effects that you should report to your care team as soon as possible: Allergic reactions--skin rash, itching, hives, swelling of the face, lips, tongue, or throat Heart rhythm changes--fast or irregular heartbeat, dizziness, feeling faint or lightheaded, chest pain, trouble breathing Infection--fever, chills, cough, sore throat, wounds that don't heal, pain or trouble when passing urine, general feeling of discomfort or being unwell Infusion reactions--chest pain, shortness of breath or trouble breathing, feeling faint or lightheaded Sudden eye pain or change in vision such as blurry vision, seeing halos around lights, vision loss Unusual bruising or bleeding Side effects that usually do not require medical attention (report to your care team if they continue or are bothersome): Constipation Diarrhea Fatigue Nausea Pain, tingling, or numbness in the hands or feet Swelling of the ankles, hands, or feet This list may not describe all possible side effects. Call your doctor for medical advice about side effects. You may report side effects to FDA at 1-800-FDA-1088. Where should I keep my  medication? This medication is given in a hospital or clinic. It will not be stored at home. NOTE: This sheet is a summary. It may not cover all possible information. If you have questions about this medicine, talk to your doctor, pharmacist, or health care provider.  2024 Elsevier/Gold Standard (2021-09-14 00:00:00)   Bortezomib Injection What is this medication? BORTEZOMIB (bor TEZ oh mib) treats lymphoma. It may also be used to treat multiple myeloma, a type of bone marrow cancer. It works by blocking a protein that causes cancer cells to grow and multiply. This helps to slow or stop the spread of cancer cells. This medicine may be used for other purposes; ask your health care provider or pharmacist if you have questions. COMMON BRAND NAME(S): BORUZU, Velcade What should I tell my care team before I take this medication? They need to know if you have any of these conditions: Dehydration Diabetes Heart disease Liver disease Tingling of the fingers or toes or other nerve disorder An unusual or allergic reaction to bortezomib, other medications, foods, dyes, or preservatives If you or your partner are pregnant or trying to get pregnant Breastfeeding How should I use this medication? This medication is injected into a vein or under the skin. It is given by your care team in a hospital or clinic setting. Talk to your care team about the use  of this medication in children. Special care may be needed. Overdosage: If you think you have taken too much of this medicine contact a poison control center or emergency room at once. NOTE: This medicine is only for you. Do not share this medicine with others. What if I miss a dose? Keep appointments for follow-up doses. It is important not to miss your dose. Call your care team if you are unable to keep an appointment. What may interact with this medication? Ketoconazole Rifampin This list may not describe all possible interactions. Give your  health care provider a list of all the medicines, herbs, non-prescription drugs, or dietary supplements you use. Also tell them if you smoke, drink alcohol, or use illegal drugs. Some items may interact with your medicine. What should I watch for while using this medication? Your condition will be monitored carefully while you are receiving this medication. You may need blood work while taking this medication. This medication may affect your coordination, reaction time, or judgment. Do not drive or operate machinery until you know how this medication affects you. Sit up or stand slowly to reduce the risk of dizzy or fainting spells. Drinking alcohol with this medication can increase the risk of these side effects. This medication may increase your risk of getting an infection. Call your care team for advice if you get a fever, chills, sore throat, or other symptoms of a cold or flu. Do not treat yourself. Try to avoid being around people who are sick. Check with your care team if you have severe diarrhea, nausea, and vomiting, or if you sweat a lot. The loss of too much body fluid may make it dangerous for you to take this medication. Talk to your care team if you may be pregnant. Serious birth defects can occur if you take this medication during pregnancy and for 7 months after the last dose. You will need a negative pregnancy test before starting this medication. Contraception is recommended while taking this medication and for 7 months after the last dose. Your care team can help you find the option that works for you. If your partner can get pregnant, use a condom during sex while taking this medication and for 4 months after the last dose. Do not breastfeed while taking this medication and for 2 months after the last dose. This medication may cause infertility. Talk to your care team if you are concerned about your fertility. What side effects may I notice from receiving this medication? Side effects  that you should report to your care team as soon as possible: Allergic reactions--skin rash, itching, hives, swelling of the face, lips, tongue, or throat Bleeding--bloody or black, tar-like stools, vomiting blood or Lakeesha Fontanilla material that looks like coffee grounds, red or dark Celie Desrochers urine, small red or purple spots on skin, unusual bruising or bleeding Bleeding in the brain--severe headache, stiff neck, confusion, dizziness, change in vision, numbness or weakness of the face, arm, or leg, trouble speaking, trouble walking, vomiting Bowel blockage--stomach cramping, unable to have a bowel movement or pass gas, loss of appetite, vomiting Heart failure--shortness of breath, swelling of the ankles, feet, or hands, sudden weight gain, unusual weakness or fatigue Infection--fever, chills, cough, sore throat, wounds that don't heal, pain or trouble when passing urine, general feeling of discomfort or being unwell Liver injury--right upper belly pain, loss of appetite, nausea, light-colored stool, dark yellow or Nivea Wojdyla urine, yellowing skin or eyes, unusual weakness or fatigue Low blood pressure--dizziness, feeling faint or lightheaded,  blurry vision Lung injury--shortness of breath or trouble breathing, cough, spitting up blood, chest pain, fever Pain, tingling, or numbness in the hands or feet Severe or prolonged diarrhea Stomach pain, bloody diarrhea, pale skin, unusual weakness or fatigue, decrease in the amount of urine, which may be signs of hemolytic uremic syndrome Sudden and severe headache, confusion, change in vision, seizures, which may be signs of posterior reversible encephalopathy syndrome (PRES) TTP--purple spots on the skin or inside the mouth, pale skin, yellowing skin or eyes, unusual weakness or fatigue, fever, fast or irregular heartbeat, confusion, change in vision, trouble speaking, trouble walking Tumor lysis syndrome (TLS)--nausea, vomiting, diarrhea, decrease in the amount of urine,  dark urine, unusual weakness or fatigue, confusion, muscle pain or cramps, fast or irregular heartbeat, joint pain Side effects that usually do not require medical attention (report to your care team if they continue or are bothersome): Constipation Diarrhea Fatigue Loss of appetite Nausea This list may not describe all possible side effects. Call your doctor for medical advice about side effects. You may report side effects to FDA at 1-800-FDA-1088. Where should I keep my medication? This medication is given in a hospital or clinic. It will not be stored at home. NOTE: This sheet is a summary. It may not cover all possible information. If you have questions about this medicine, talk to your doctor, pharmacist, or health care provider.  2024 Elsevier/Gold Standard (2021-10-12 00:00:00)       To help prevent nausea and vomiting after your treatment, we encourage you to take your nausea medication as directed.  BELOW ARE SYMPTOMS THAT SHOULD BE REPORTED IMMEDIATELY: *FEVER GREATER THAN 100.4 F (38 C) OR HIGHER *CHILLS OR SWEATING *NAUSEA AND VOMITING THAT IS NOT CONTROLLED WITH YOUR NAUSEA MEDICATION *UNUSUAL SHORTNESS OF BREATH *UNUSUAL BRUISING OR BLEEDING *URINARY PROBLEMS (pain or burning when urinating, or frequent urination) *BOWEL PROBLEMS (unusual diarrhea, constipation, pain near the anus) TENDERNESS IN MOUTH AND THROAT WITH OR WITHOUT PRESENCE OF ULCERS (sore throat, sores in mouth, or a toothache) UNUSUAL RASH, SWELLING OR PAIN  UNUSUAL VAGINAL DISCHARGE OR ITCHING   Items with * indicate a potential emergency and should be followed up as soon as possible or go to the Emergency Department if any problems should occur.  Please show the CHEMOTHERAPY ALERT CARD or IMMUNOTHERAPY ALERT CARD at check-in to the Emergency Department and triage nurse.  Should you have questions after your visit or need to cancel or reschedule your appointment, please contact [facility] [place]  - A DEPT OF [facility] [phone]  and follow the prompts.  Office hours are 8:00 a.m. to 4:30 p.m. Monday - Friday. Please note that voicemails left after 4:00 p.m. may not be returned until the following business day.  We are closed weekends and major holidays. You have access to a nurse at all times for urgent questions. Please call the main number to the clinic [phone] and follow the prompts.  For any non-urgent questions, you may also contact your provider using MyChart. We now offer e-Visits for anyone 13 and older to request care online for non-urgent symptoms. For details visit [email or web address].   Also download the MyChart app! Go to the app store, search "MyChart", open the app, select [place], and log in with your MyChart username and password.

## 2024-01-17 NOTE — Telephone Encounter (Signed)
 Chart reviewed. Revlimid  refilled per last office note with Dr. Davonna.

## 2024-01-17 NOTE — Progress Notes (Signed)
 Patient presents today for  Daratumumab  and Velcade  injections.  Patient is in satisfactory condition with no new complaints voiced.  Vital signs are stable.  Labs reviewed and all labs are within treatment parameters.  Potassium today is 3.4.  We will give Klor Con 40 mEq PO x one dose today per standing orders by Dr. Davonna.  We will proceed with treatment per MD orders.    Patient tolerated injections with no complaints voiced.  Site clean and dry with no bruising or swelling noted.  No complaints of pain.  Discharged with vital signs stable and no signs or symptoms of distress noted.

## 2024-01-18 ENCOUNTER — Ambulatory Visit: Payer: Self-pay

## 2024-01-18 NOTE — Telephone Encounter (Signed)
 FYI Only or Action Required?: Action required by provider: request for appointment.  Patient was last seen in primary care on 12/25/2023 by Bevely Doffing, FNP.  Called Nurse Triage reporting Flank Pain.  Symptoms began several weeks ago.  Interventions attempted: Prescription medications: as prescribed.  Symptoms are: unchanged.  Triage Disposition: Go to ED Now (Notify PCP)  Patient/caregiver understands and will follow disposition?: No, refuses disposition   Copied from CRM [id number]. Topic: Clinical - Red Word Triage >> Jan 18, 2024  4:34 PM Nathanel BROCKS wrote: Red Word that prompted transfer to Nurse Triage: pain in right side and it is terrible. Its been going on for about 3 weeks and the office sent her for xrays but no one has told her the results. She said that the pain in something terrible. Reason for Disposition  [1] SEVERE pain (e.g., excruciating, scale 8-10) AND [2] not improved after pain medicine  Answer Assessment - Initial Assessment Questions Additional info:  Needs results from Xray and IGA.    1. LOCATION: Where does it hurt? (e.g., left, right)     Right side  2. ONSET: When did the pain start?     3 weeks ago 3. SEVERITY: How bad is the pain? (e.g., Scale 1-10; mild, moderate, or severe)     Terrible 4. PATTERN: Does the pain come and go, or is it constant?      Constant 5. CAUSE: What do you think is causing the pain?     Unsure 6. OTHER SYMPTOMS:  Do you have any other symptoms? (e.g., fever, abdomen pain, vomiting, leg weakness, burning with urination, blood in urine)     Denies  Protocols used: Flank Pain-A-AH

## 2024-01-19 NOTE — Telephone Encounter (Signed)
 Scheduled and added to a waiting list

## 2024-01-20 IMAGING — PT NM PET TUM IMG RESTAG (PS) SKULL BASE T - THIGH
7 series · 25 of 25 positions shown · non-contrast
Comparison: Multiple exams, including PET-CT 04/07/2020

CLINICAL DATA: Subsequent treatment strategy for myeloma.

EXAM:
NUCLEAR MEDICINE PET SKULL VERTEX THROUGH MID THIGHS.
TECHNIQUE: 8.9 mCi F-18 FDG was injected intravenously. Full-ring PET imaging
was performed from the head to the mid thigh region after the
radiotracer. CT data was obtained and used for attenuation
correction and anatomic localization.
Fasting blood glucose: 106 mg/dl

[Series 3: ctac · axial · 3.0mm · 0.98mm/px · z∈[-1048,+6]mm · 4 of 352 slices shown]
[im 1/352]
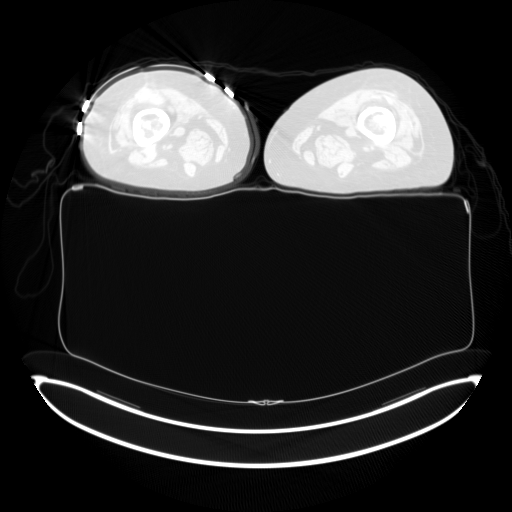
[im 118/352]
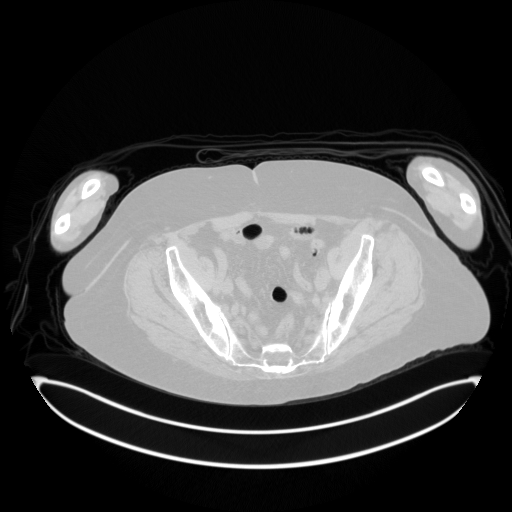
[im 235/352]
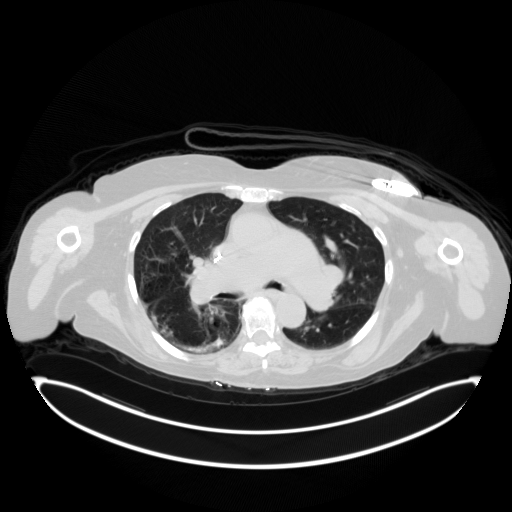
[im 352/352]
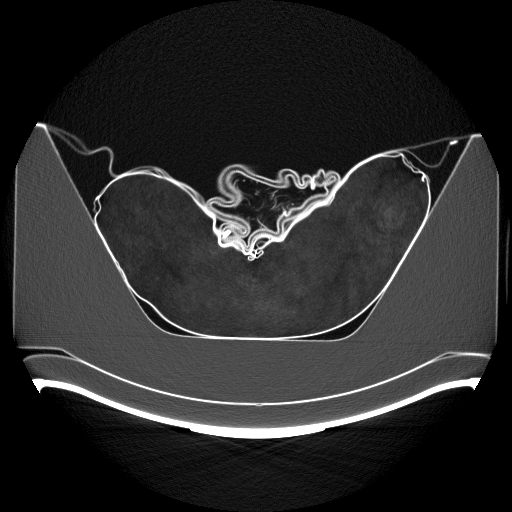

[Series 4: pet ac · axial · 3.0mm · 4.11mm/px · z∈[-1048,+6]mm · 5 of 352 slices shown]
[im 1/352]
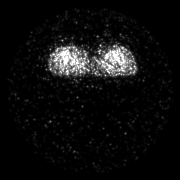
[im 88/352]
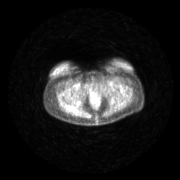
[im 176/352]
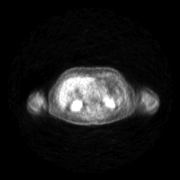
[im 264/352]
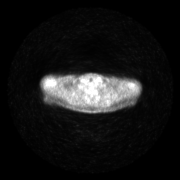
[im 352/352]
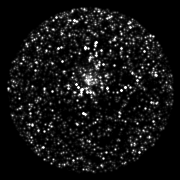

[Series 5: pet nac · axial · 3.0mm · 4.11mm/px · z∈[-1048,+6]mm · 5 of 352 slices shown]
[im 1/352]
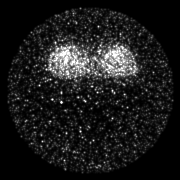
[im 88/352]
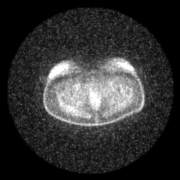
[im 176/352]
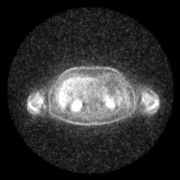
[im 264/352]
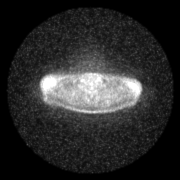
[im 352/352]
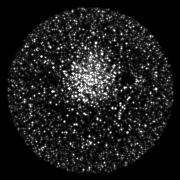

[Series 7: ct lung · axial · 3.0mm · 0.98mm/px · 1 of 89 slices shown]
[im 1/89]
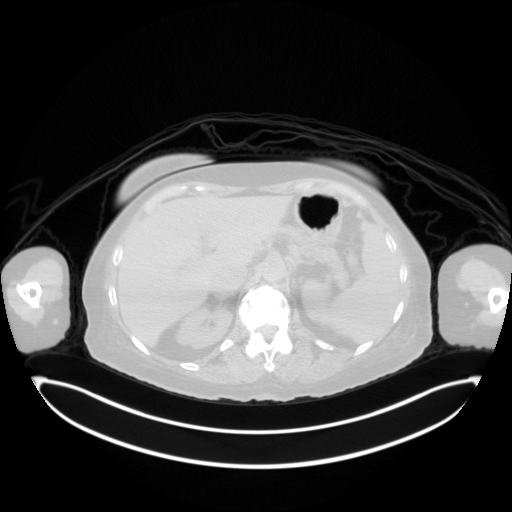

[Series 606: fused tra · 7 of 526 slices shown]
[im 1/526]
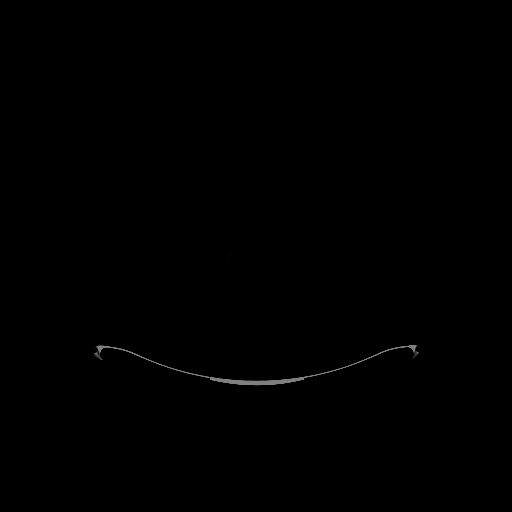
[im 88/526]
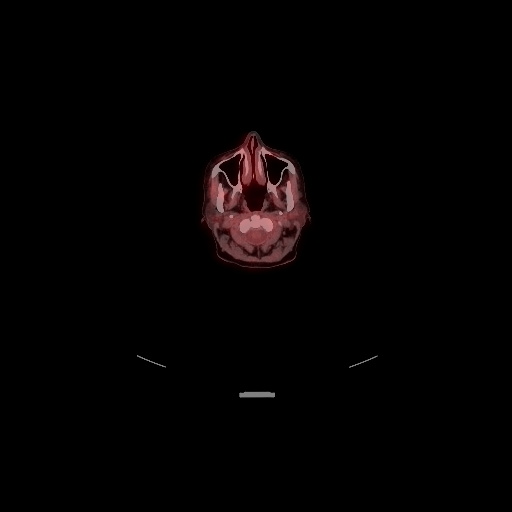
[im 176/526]
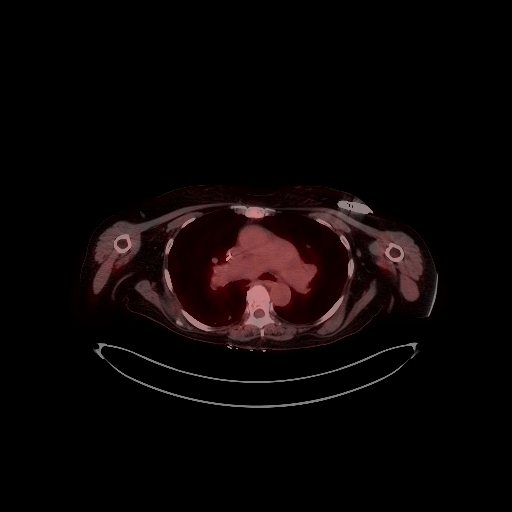
[im 263/526]
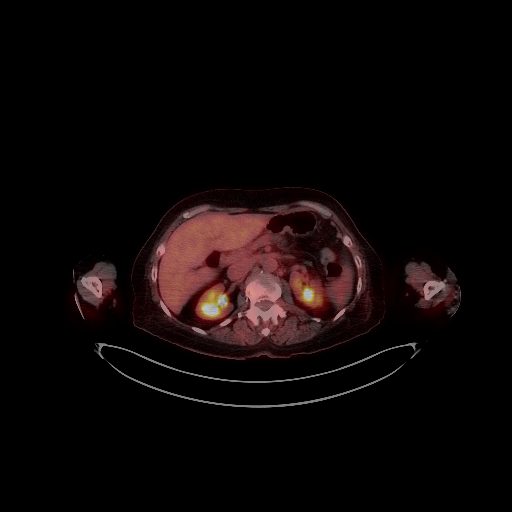
[im 351/526]
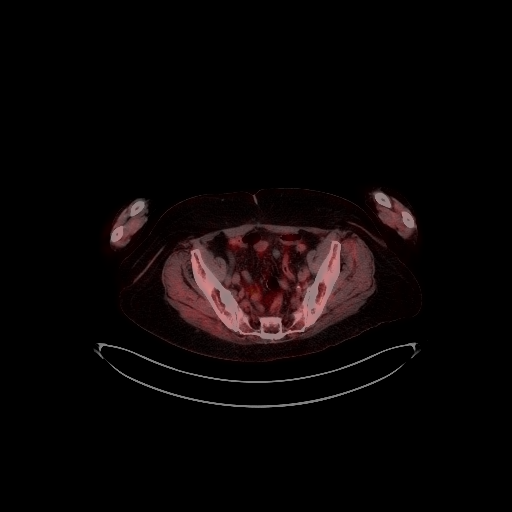
[im 438/526]
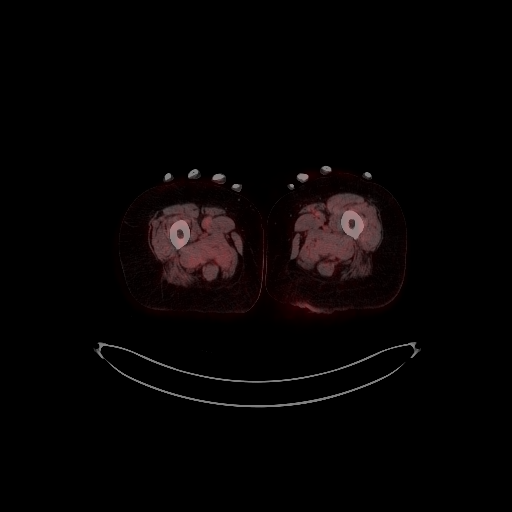
[im 526/526]
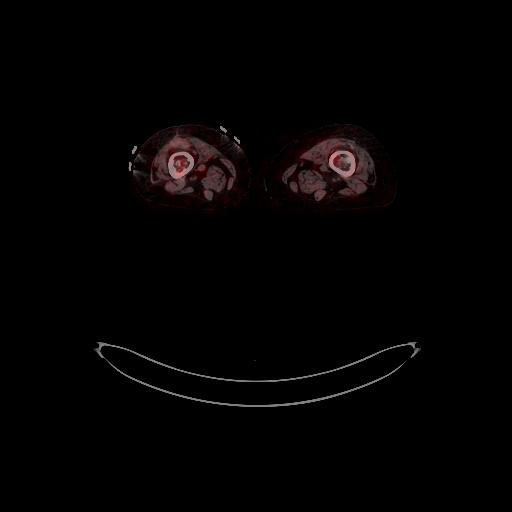

[Series 608: fused cor · 2 of 140 slices shown]
[im 1/140]
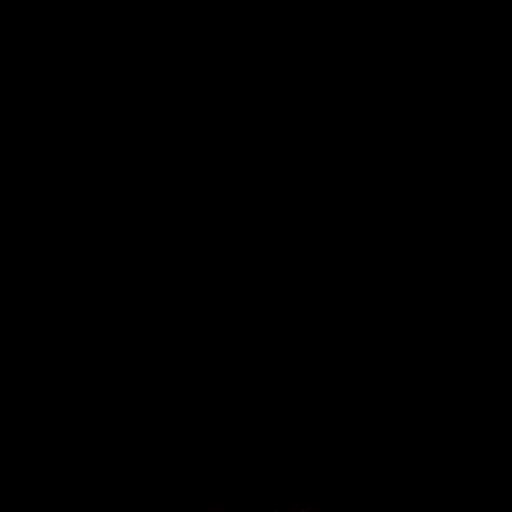
[im 140/140]
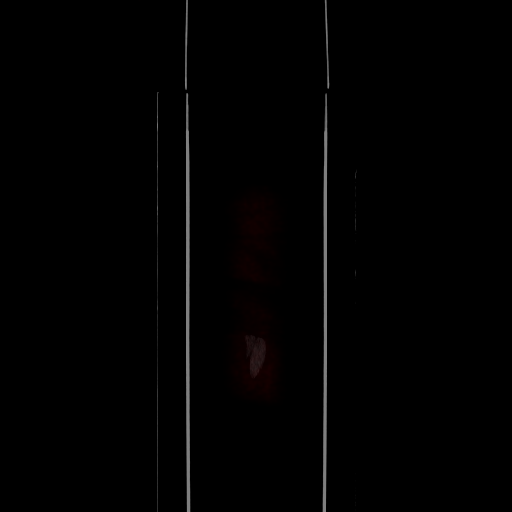

[Series 609: mip cine · coronal · 2.18mm/px · 1 of 48 slices shown]
[im 1/48]
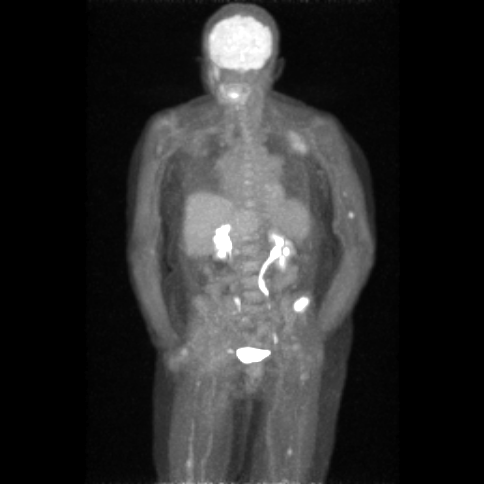

[25 of 25 positions shown; findings below may reference images not displayed]

FINDINGS: Mediastinal blood pool activity: SUV max

HEAD/NECK: No significant abnormal hypermetabolic activity in this
region. Anterior tongue activity is likely physiologic.

Incidental CT findings: none

CHEST: No significant abnormal hypermetabolic activity in this
region.

Incidental CT findings: Dual lead pacer device. Prominent main
pulmonary artery. Mild cardiomegaly. Bandlike opacities in the lower
lobes similar to 03/01/2021 favoring scarring. Punctate high
densities peripherally in the right lower lobe on image 56 of series
7 are stable in could represent calcifications, remotely aspirated
barium, a likely inconsequential amount of embolized methacrylate.

ABDOMEN/PELVIS: Photopenic right renal cyst.

Incidental CT findings: The spleen measures 11.8 by 6.0 by 8.6 cm
(volume = 320 cm^3), within normal limits. Right kidney lower pole
nonobstructive nephrolithiasis.

SKELETON: Multiple new hypermetabolic skeletal lesions with
involvement of the left anterior iliac crest, left mid humerus,
right superior pubic ramus, bilateral mid femurs, and left
calvarium. The largest lesion is the lytic lesion anteriorly in the
left iliac crest measuring 2.9 by 2.3 cm on image 221 series 3,
maximum SUV 15.4. The more distal of two foci in the left humeral
diaphysis maximum SUV of 4.9. A new lucent lesion in the left
parietal calvarium measures 0.6 by 0.5 cm on image 31 of series 3,
maximum SUV 5.7.

Incidental CT findings: none
IMPRESSION: 1. Progressive myeloma, with several scattered hypermetabolic
skeletal lesions including the bony pelvis, left mid humerus,
bilateral mid femurs, right superior pubic ramus, and left
calvarium.
2. Prominent main pulmonary artery, cannot exclude pulmonary
arterial hypertension. Mild cardiomegaly.
3. Stable bibasilar scarring.
4. Nonobstructive right nephrolithiasis.

## 2024-01-25 ENCOUNTER — Other Ambulatory Visit: Payer: Self-pay | Admitting: *Deleted

## 2024-01-25 MED ORDER — FOLIC ACID 1 MG PO TABS: 1.0000 mg | ORAL_TABLET | Freq: Every day | ORAL | 2 refills | Status: DC

## 2024-01-29 ENCOUNTER — Ambulatory Visit: Admitting: Nurse Practitioner

## 2024-01-31 ENCOUNTER — Inpatient Hospital Stay

## 2024-01-31 ENCOUNTER — Inpatient Hospital Stay: Attending: Oncology

## 2024-01-31 VITALS — BP 128/72 | HR 63 | Temp 96.4°F | Resp 18 | Wt 131.2 lb

## 2024-01-31 DIAGNOSIS — Z86711 Personal history of pulmonary embolism: Secondary | ICD-10-CM | POA: Insufficient documentation

## 2024-01-31 DIAGNOSIS — R0789 Other chest pain: Secondary | ICD-10-CM | POA: Diagnosis not present

## 2024-01-31 DIAGNOSIS — C9000 Multiple myeloma not having achieved remission: Secondary | ICD-10-CM

## 2024-01-31 DIAGNOSIS — Z7901 Long term (current) use of anticoagulants: Secondary | ICD-10-CM | POA: Insufficient documentation

## 2024-01-31 DIAGNOSIS — Z79899 Other long term (current) drug therapy: Secondary | ICD-10-CM | POA: Diagnosis not present

## 2024-01-31 DIAGNOSIS — Z5112 Encounter for antineoplastic immunotherapy: Secondary | ICD-10-CM | POA: Diagnosis present

## 2024-01-31 DIAGNOSIS — D509 Iron deficiency anemia, unspecified: Secondary | ICD-10-CM

## 2024-01-31 LAB — CBC WITH DIFFERENTIAL/PLATELET
Abs Immature Granulocytes: 0 K/uL (ref 0.00–0.07)
Basophils Absolute: 0 K/uL (ref 0.0–0.1)
Basophils Relative: 1 %
Eosinophils Absolute: 0.2 K/uL (ref 0.0–0.5)
Eosinophils Relative: 8 %
HCT: 35.3 % — ABNORMAL LOW (ref 36.0–46.0)
Hemoglobin: 11.9 g/dL — ABNORMAL LOW (ref 12.0–15.0)
Immature Granulocytes: 0 %
Lymphocytes Relative: 23 %
Lymphs Abs: 0.6 K/uL — ABNORMAL LOW (ref 0.7–4.0)
MCH: 34.7 pg — ABNORMAL HIGH (ref 26.0–34.0)
MCHC: 33.7 g/dL (ref 30.0–36.0)
MCV: 102.9 fL — ABNORMAL HIGH (ref 80.0–100.0)
Monocytes Absolute: 0.5 K/uL (ref 0.1–1.0)
Monocytes Relative: 16 %
Neutro Abs: 1.4 K/uL — ABNORMAL LOW (ref 1.7–7.7)
Neutrophils Relative %: 52 %
Platelets: 134 K/uL — ABNORMAL LOW (ref 150–400)
RBC: 3.43 MIL/uL — ABNORMAL LOW (ref 3.87–5.11)
RDW: 16.3 % — ABNORMAL HIGH (ref 11.5–15.5)
WBC: 2.8 K/uL — ABNORMAL LOW (ref 4.0–10.5)
nRBC: 0 % (ref 0.0–0.2)

## 2024-01-31 LAB — COMPREHENSIVE METABOLIC PANEL WITH GFR
ALT: 22 U/L (ref 0–44)
AST: 21 U/L (ref 15–41)
Albumin: 3.7 g/dL (ref 3.5–5.0)
Alkaline Phosphatase: 49 U/L (ref 38–126)
Anion gap: 11 (ref 5–15)
BUN: 26 mg/dL — ABNORMAL HIGH (ref 8–23)
CO2: 27 mmol/L (ref 22–32)
Calcium: 8.7 mg/dL — ABNORMAL LOW (ref 8.9–10.3)
Chloride: 102 mmol/L (ref 98–111)
Creatinine, Ser: 0.85 mg/dL (ref 0.44–1.00)
GFR, Estimated: >60 mL/min (ref >60)
Glucose, Bld: 91 mg/dL (ref 70–99)
Potassium: 3.1 mmol/L — ABNORMAL LOW (ref 3.5–5.1)
Sodium: 140 mmol/L (ref 135–145)
Total Bilirubin: 0.6 mg/dL (ref 0.0–1.2)
Total Protein: 7.4 g/dL (ref 6.5–8.1)

## 2024-01-31 MED ORDER — ACETAMINOPHEN 325 MG PO TABS
650.0000 mg | ORAL_TABLET | Freq: Once | ORAL | Status: AC
  Administered 2024-01-31: 650 mg via ORAL
  Filled 2024-01-31: qty 2

## 2024-01-31 MED ORDER — POTASSIUM CHLORIDE CRYS ER 20 MEQ PO TBCR
40.0000 meq | EXTENDED_RELEASE_TABLET | Freq: Once | ORAL | Status: AC
  Administered 2024-01-31: 40 meq via ORAL
  Filled 2024-01-31: qty 2

## 2024-01-31 MED ORDER — BORTEZOMIB CHEMO SQ INJECTION 3.5 MG (2.5MG/ML)
0.7000 mg/m2 | Freq: Once | INTRAMUSCULAR | Status: AC
  Administered 2024-01-31: 1.25 mg via SUBCUTANEOUS
  Filled 2024-01-31: qty 0.5

## 2024-01-31 MED ORDER — CETIRIZINE HCL 10 MG PO TABS
10.0000 mg | ORAL_TABLET | Freq: Once | ORAL | Status: AC
  Administered 2024-01-31: 10 mg via ORAL
  Filled 2024-01-31: qty 1

## 2024-01-31 MED ORDER — DEXAMETHASONE 4 MG PO TABS
20.0000 mg | ORAL_TABLET | Freq: Once | ORAL | Status: AC
  Administered 2024-01-31: 20 mg via ORAL
  Filled 2024-01-31: qty 5

## 2024-01-31 MED ORDER — DARATUMUMAB-HYALURONIDASE-FIHJ 1800-30000 MG-UT/15ML ~~LOC~~ SOLN
1800.0000 mg | Freq: Once | SUBCUTANEOUS | Status: AC
  Administered 2024-01-31: 1800 mg via SUBCUTANEOUS
  Filled 2024-01-31: qty 15

## 2024-01-31 MED ORDER — PROCHLORPERAZINE MALEATE 10 MG PO TABS
10.0000 mg | ORAL_TABLET | Freq: Once | ORAL | Status: AC
  Administered 2024-01-31: 10 mg via ORAL
  Filled 2024-01-31: qty 1

## 2024-01-31 MED ORDER — DENOSUMAB 120 MG/1.7ML ~~LOC~~ SOLN
120.0000 mg | Freq: Once | SUBCUTANEOUS | Status: AC
  Administered 2024-01-31: 120 mg via SUBCUTANEOUS
  Filled 2024-01-31: qty 1.7

## 2024-01-31 NOTE — Progress Notes (Signed)
 Patient presents today for Daratumumab , Velcade  and Xgeva  injections per providers order.  Vital signs within parameters for treatment.  ANC noted to be 1.4, MD notified and message received from Isaiah Piety RN/Dr. Davonna patient okay for treatment.  Patient has been taking vitamin D  and Calcium supplements, has had no prior or upcoming dental work and no jaw pain.  Stable during administration without incident; injection site WNL; see MAR for injection details.  Patient tolerated procedure well and without incident.  No questions or complaints noted at this time.

## 2024-01-31 NOTE — Progress Notes (Signed)
 OK to treat with ANC 1.4  V.O. Dr Ivery Molt, PharmD

## 2024-01-31 NOTE — Patient Instructions (Signed)
 [facility] [place] - A DEPT OF [facility]  Discharge Instructions: Thank you for choosing [facility] to provide your oncology and hematology care.  If you have a lab appointment with the Cancer Center - please note that after April 8th, 2024, all labs will be drawn in the cancer center.  You do not have to check in or register with the main entrance as you have in the past but will complete your check-in in the cancer center.  Wear comfortable clothing and clothing appropriate for easy access to any Portacath or PICC line.   We strive to give you quality time with your provider. You may need to reschedule your appointment if you arrive late (15 or more minutes).  Arriving late affects you and other patients whose appointments are after yours.  Also, if you miss three or more appointments without notifying the office, you may be dismissed from the clinic at the provider's discretion.      For prescription refill requests, have your pharmacy contact our office and allow 72 hours for refills to be completed.    Today you received the following chemotherapy and/or immunotherapy agents Daratumumab /velcade /xgeva       To help prevent nausea and vomiting after your treatment, we encourage you to take your nausea medication as directed.  BELOW ARE SYMPTOMS THAT SHOULD BE REPORTED IMMEDIATELY: *FEVER GREATER THAN 100.4 F (38 C) OR HIGHER *CHILLS OR SWEATING *NAUSEA AND VOMITING THAT IS NOT CONTROLLED WITH YOUR NAUSEA MEDICATION *UNUSUAL SHORTNESS OF BREATH *UNUSUAL BRUISING OR BLEEDING *URINARY PROBLEMS (pain or burning when urinating, or frequent urination) *BOWEL PROBLEMS (unusual diarrhea, constipation, pain near the anus) TENDERNESS IN MOUTH AND THROAT WITH OR WITHOUT PRESENCE OF ULCERS (sore throat, sores in mouth, or a toothache) UNUSUAL RASH, SWELLING OR PAIN  UNUSUAL VAGINAL DISCHARGE OR ITCHING   Items with * indicate a potential emergency and  should be followed up as soon as possible or go to the Emergency Department if any problems should occur.  Please show the CHEMOTHERAPY ALERT CARD or IMMUNOTHERAPY ALERT CARD at check-in to the Emergency Department and triage nurse.  Should you have questions after your visit or need to cancel or reschedule your appointment, please contact [facility] [place] - A DEPT OF [facility] [phone]  and follow the prompts.  Office hours are 8:00 a.m. to 4:30 p.m. Monday - Friday. Please note that voicemails left after 4:00 p.m. may not be returned until the following business day.  We are closed weekends and major holidays. You have access to a nurse at all times for urgent questions. Please call the main number to the clinic [phone] and follow the prompts.  For any non-urgent questions, you may also contact your provider using MyChart. We now offer e-Visits for anyone 100 and older to request care online for non-urgent symptoms. For details visit [email or web address].   Also download the MyChart app! Go to the app store, search MyChart, open the app, select [facility], and log in with your MyChart username and password.

## 2024-01-31 NOTE — Progress Notes (Signed)
 [name]

## 2024-02-05 ENCOUNTER — Ambulatory Visit: Admitting: Family Medicine

## 2024-02-08 ENCOUNTER — Telehealth: Payer: Self-pay

## 2024-02-08 NOTE — Telephone Encounter (Signed)
 Received call from [facility], they do compliance packaging for pt and was red flagged when they saw pt is taking Celexa  and Zoloft . They just want to confirm that pt is taking these together? Please advise

## 2024-02-09 NOTE — Telephone Encounter (Signed)
 Pharmacy informed.

## 2024-02-12 ENCOUNTER — Other Ambulatory Visit: Payer: Self-pay

## 2024-02-12 DIAGNOSIS — D509 Iron deficiency anemia, unspecified: Secondary | ICD-10-CM

## 2024-02-12 DIAGNOSIS — C9000 Multiple myeloma not having achieved remission: Secondary | ICD-10-CM

## 2024-02-12 NOTE — Progress Notes (Signed)
 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 female with stage II IgA kappa multiple myeloma, high risk with del 17p  Assessment & Plan Multiple myeloma not having achieved remission [facility]) Patient with high risk multiple myeloma with 7P deletion. S/p induction with Dara VRD.  Patient refused transplantation as per documentation Maintenance on Velcade  and Revlimid  with recent worsening of multiple myeloma labs and was restarted on daratumumab  Currently on daratumumab , Velcade  and Revlimid   - Reviewed labs today.  CMP: Potassium: 2.6, creatinine: Normal, LFTs: Stable, CBC: WBC: 3.5, hemoglobin: 12, platelets: 133, ANC: 2200. - Continue daratumumab , Velcade  and Revlimid  as scheduled.  -Physical exam stable today.  Can proceed with chemotherapy today. - Continue acyclovir  twice daily - Will draw multiple myeloma labs with every other treatment  Return to clinic in 2 months, prior to treatment Hypokalemia Potassium 2.6 today Patient denies nausea, vomiting, diarrhea or use of Lasix EKG reviewed today: No significant ST-T wave elevations  - Will administer potassium IV per protocol here today - Recommend patient to take 2 pills that is 40 mEq twice a day for 3 days followed by 20 mEq twice a day for the rest of the time -Will bring patient on Monday to recheck this levels -Will closely monitor potassium levels Chest wall pain Patient reports right  lower rib pain Recent PET scan did not show any lytic lesions in that area  - Will obtain x-ray today to rule out bone fractures Other polyneuropathy 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 Pulmonary embolism, other, unspecified chronicity, unspecified whether acute cor pulmonale present [facility]) Patient has a history of PE and is on Coumadin  - Considering patient is on Revlimid , high risk of thrombosis, will need indefinite anticoagulation - Continue Coumadin as prescribed    The patient understands the plans discussed today and is in agreement with them.  She knows to contact our office if she develops concerns prior to her next appointment.  40 minutes of total time was spent for this patient encounter, including preparation, face-to-face counseling with the patient and coordination of care, physical exam, and documentation of the encounter. > 50% of the time was spent on counseling as documented under my assessment and plan.    Courtney Rogers,Courtney as a Neurosurgeon for Siad Davonna, Courtney Rogers.,Courtney documented all relevant documentation on the behalf of Siad Davonna, Courtney Rogers,as directed by  Siad Davonna, Courtney Rogers while in the presence of Siad Davonna, Courtney Rogers.  I, Siad Davonna Courtney Rogers, Courtney reviewed the above documentation for accuracy and completeness, and I agree with the above.     Siad Davonna, Courtney Rogers  [place] [place] - A DEPT OF [facility] [address] [place] [address] Dept: [phone] Dept Fax: [phone]   Orders Placed This Encounter  Procedures   DG Ribs Unilateral Right    Standing Status:   Future    Number of  Occurrences:   1    Expected Date:   02/13/2024    Expiration Date:   02/12/2025    Reason for Exam (SYMPTOM  OR DIAGNOSIS REQUIRED):   right chest wall pain    Preferred imaging location?:   [facility]   Multiple  Myeloma Panel (SPEP&IFE w/QIG)    Standing Status:   Future    Number of Occurrences:   1    Expected Date:   02/13/2024    Expiration Date:   05/13/2024   Kappa/lambda light chains    Standing Status:   Future    Number of Occurrences:   1    Expected Date:   02/13/2024    Expiration Date:   05/13/2024   Kappa/lambda light chains    Standing Status:   Future    Expected Date:   03/26/2024    Expiration Date:   06/24/2024   Protein electrophoresis, serum    Standing Status:   Future    Expected Date:   03/26/2024    Expiration Date:   06/24/2024   EKG 12-Lead     ONCOLOGY HISTORY:    Diagnosis: Stage II IgA kappa multiple myeloma, 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 IFE: biclonal IgA protein with kappa specificity. NO CRAB criteria -06/25/2019: FLC ratio 4.80. LDH 207. Beta-2  microglobulin 2.8. M-spike 2.5.IFE:  No CRAB criteria -07/09/2019: Bone Marrow Biopsy.  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  -07/09/2019: Initial PET: No findings of active malignancy  -08/20/2019: SPEP:M spike: 2.3. Hb:10.1, Normal Creatinine and calcium -02/07/2020: DXA scan with T-score -0.5.  -06/09/2021: Hb:10.8, Cr:1.05, Ca:10.2, LDH:135, M spike:3.5. FLC ratio: 17.43 (Worsening myeloma panel) -07/12/2021: Bone marrow Biopsy.  -Pathology: Hypercellular bone marrow with kappa light chain restricted plasma cell neoplasm representing 30% of all cells.  -FISH: Gain of 1 q. (high risk), T p53 deletion (high risk), monosomy 13 (standard risk)  -Cytogenetics: Complex karyotype.  -06/24/2021: PET: Progressive myeloma, with several scattered hypermetabolic skeletal lesions including the bony pelvis, left mid humerus, bilateral mid femurs, right superior pubic ramus, and left calvarium. -07/29/2021: 2D echo:  EF 50-55%. There is distal septal and inferior  apical hypokinesis.  -08/10/2021-11/30/2021: 6 cycles of Dara VRD -09/09/2021: Patient met with Dr. Darwyn at [facility] for bone marrow transplant.  As per documentation by her previous oncologist on 10/19/2021, patient refused pursuing transplant option. -12/07/2021: Myeloma panel: M-spike 0.2g and normal FLC ratio. Negative DIRA test. -12/20/2021 -Current: Maintenance Velcade  0.7 mg/m every 2 weeks and Revlimid  3 weeks on/1 week off -08/10/2023: Revlimid  dose increased to 10 mg 3 weeks on/1 week off due to increased M spike of 0.4 (missed Revlimid  in 10/2023) -09/2023-12/2023: Worsening MM labs- M spike and FLC -01/03/2024: Daratumumab  added to regimen due worsening myeloma labs -01/04/2024: PET: No evidence of active myeloma. No evidence of plasmacytoma.  -01/12/2024: SPEP: M spike: 0.8, kappa free light chain: 14.4, lambda free light chain: 10.5, ratio: 1.37.  IFE: Biclonal IgA protein with kappa specificity.   Current Treatment:  Daratumumab  (monthly) + Velcade (0.7mg /m2 every 2 weeks) + Revlimid  (10mg  3 weeks on/one week off)  INTERVAL HISTORY:   DELSA WALDER is here today for follow up.   She has been experiencing persistent right-sided back pain since July, which occasionally radiates but stops at a certain point. She uses Tylenol  for pain relief, which provides  some relief.   She has a history of low potassium levels and is currently taking potassium supplements, one pill twice a day, one in the morning and one at night. She denies diarrhea or vomiting and is not taking Lasix. She mentions a history of 'always having problems with potassium'.  Her current medication regimen includes Coumadin and Revlimid . No heart racing, diarrhea, or vomiting.  I Courtney reviewed the past medical history, past surgical history, social history and family history with the patient and they are unchanged from previous note.  ALLERGIES:  is allergic to amoxicillin-pot clavulanate,  clarithromycin, erythromycin, lisinopril, and amoxicillin.  MEDICATIONS:  Current Outpatient Medications  Medication Sig Dispense Refill   acetaminophen  (TYLENOL ) 500 MG tablet Take 1,000 mg by mouth 2 (two) times daily as needed for moderate pain or headache.     acyclovir  (ZOVIRAX ) 400 MG tablet TAKE ONE TABLET BY MOUTH TWICE DAILY 60 tablet 4   amLODipine  (NORVASC ) 10 MG tablet Take 10 mg by mouth.     ASPIRIN LOW DOSE 81 MG tablet Take 81 mg by mouth daily.     B Complex-Folic Acid  (B COMPLEX VITAMINS, W/ FA,) CAPS Take 1 capsule by mouth daily.     busPIRone  (BUSPAR ) 5 MG tablet Take 5 mg by mouth daily.     Calcium Carb-Cholecalciferol 600-10 MG-MCG TABS Take 1 tablet by mouth 2 (two) times daily.     Calcium Carbonate-Vitamin D  (CALCIUM 600+D PO) Take 1 tablet by mouth 2 (two) times daily.     chlorthalidone (HYGROTON) 25 MG tablet Take 12.5 mg by mouth every morning.     citalopram  (CELEXA ) 20 MG tablet TAKE ONE TABLET BY MOUTH EVERY DAY 30 tablet 3   cyanocobalamin  (VITAMIN B12) 1000 MCG tablet Take 1,000 mcg by mouth daily.     diclofenac  Sodium (VOLTAREN ) 1 % GEL Apply 4 g topically 4 (four) times daily. 50 g 0   dicyclomine  (BENTYL ) 10 MG capsule TAKE ONE CAPSULE BY MOUTH THREE TIMES A DAY BEFORE meals 90 capsule 2   doxycycline  (VIBRA -TABS) 100 MG tablet      FEROSUL 325 (65 Fe) MG tablet Take 325 mg by mouth daily.     fluticasone  (FLONASE ) 50 MCG/ACT nasal spray USE 2 SPRAYS IN EACH NOSTRIL ONCE DAILY 16 g 0   folic acid  (FOLVITE ) 1 MG tablet Take 1 tablet (1 mg total) by mouth daily. 30 tablet 2   GNP VITAMIN C 500 MG tablet Take 500 mg by mouth daily.     lenalidomide  (REVLIMID ) 10 MG capsule Take 1 capsule (10 mg total) by mouth daily. 21 days on, 7 days off every 28 days 21 capsule 0   levocetirizine (XYZAL ) 5 MG tablet TAKE ONE TABLET BY MOUTH IN THE EVENING 30 tablet 3   lidocaine  (LIDODERM ) 5 % Place 1 patch onto the skin daily. Remove & Discard patch within 12 hours or  as directed by Courtney Rogers 30 patch 0   magnesium  oxide (MAG-OX) 400 (240 Mg) MG tablet TAKE ONE TABLET BY MOUTH TWICE DAILY 60 tablet 4   metoprolol  tartrate (LOPRESSOR ) 50 MG tablet Take 1 tablet (50 mg total) by mouth 2 (two) times daily. 180 tablet 1   montelukast  (SINGULAIR ) 10 MG tablet TAKE ONE TABLET BY MOUTH AT BEDTIME 30 tablet 3   MYRBETRIQ  50 MG TB24 tablet TAKE ONE TABLET BY MOUTH ONCE DAILY 30 tablet 3   NICODERM CQ 14 MG/24HR patch 14 mg daily.     nitroGLYCERIN (NITROSTAT) 0.4 MG  SL tablet Place 0.4 mg under the tongue every 5 (five) minutes as needed for chest pain.     pantoprazole  (PROTONIX ) 40 MG tablet TAKE ONE TABLET BY MOUTH TWICE DAILY 90 tablet 3   potassium chloride  SA (KLOR-CON  M) 20 MEQ tablet TAKE ONE TABLET BY MOUTH TWICE DAILY 60 tablet 3   pregabalin  (LYRICA ) 50 MG capsule TAKE ONE CAPSULE BY MOUTH TWICE DAILY 60 capsule 4   primidone (MYSOLINE) 50 MG tablet TAKE ONE TABLET BY MOUTH TWICE DAILY 60 tablet 5   Probiotic Product (GNP PROBIOTIC COLON SUPPORT) CAPS Take 1 capsule by mouth daily.     sertraline  (ZOLOFT ) 50 MG tablet TAKE ONE TABLET BY MOUTH ONCE DAILY 30 tablet 3   simvastatin  (ZOCOR ) 20 MG tablet Take 20 mg by mouth at bedtime.     topiramate (TOPAMAX) 25 MG tablet Take 25 mg by mouth 2 (two) times daily.     traMADol  (ULTRAM ) 50 MG tablet Take 1 tablet (50 mg total) by mouth every 6 (six) hours as needed. 20 tablet 0   warfarin (COUMADIN) 3 MG tablet Take 3 mg by mouth daily.     warfarin (COUMADIN) 6 MG tablet Take 6 mg by mouth at bedtime.     No current facility-administered medications for this visit.   Facility-Administered Medications Ordered in Other Visits  Medication Dose Route Frequency Provider Last Rate Last Admin   0.9 % NaCl with KCl 20 mEq/ L  infusion   Intravenous Continuous Nahiem Dredge, Courtney Rogers 500 mL/hr at 02/13/24 1037 New Bag at 02/13/24 1037    REVIEW OF SYSTEMS:   Constitutional: Denies fevers, chills or abnormal weight  loss Eyes: Denies blurriness of vision Ears, nose, mouth, throat, and face: Denies mucositis or sore throat Respiratory: Denies cough, dyspnea or wheezes Cardiovascular: Denies palpitation, chest discomfort or lower extremity swelling Gastrointestinal:  Denies nausea, heartburn or change in bowel habits Skin: Denies abnormal skin rashes Lymphatics: Denies new lymphadenopathy or easy bruising Neurological:Denies numbness, tingling or new weaknesses Behavioral/Psych: Mood is stable, no new changes  All other systems were reviewed with the patient and are negative.   VITALS:  Blood pressure 111/64, pulse 63, temperature (!) 97.5 F (36.4 C), temperature source Tympanic, resp. rate 18, weight 136 lb 9.6 oz (62 kg), SpO2 100%.  Wt Readings from Last 3 Encounters:  02/13/24 136 lb 9.6 oz (62 kg)  01/31/24 131 lb 3.2 oz (59.5 kg)  01/17/24 131 lb 13.4 oz (59.8 kg)    Body mass index is 23.45 kg/m.  Performance status (ECOG): 2 - Symptomatic, <50% confined to bed  PHYSICAL EXAM:   GENERAL:alert, no distress and comfortable SKIN: skin color, texture, turgor are normal, no rashes or significant lesions EYES: normal, Conjunctiva are pink and non-injected, sclera clear LYMPH:  no palpable lymphadenopathy in the cervical, axillary or inguinal LUNGS: clear to auscultation and percussion with normal breathing effort HEART: regular rate & rhythm and no murmurs and no lower extremity edema ABDOMEN:abdomen soft, non-tender and normal bowel sounds Musculoskeletal: Tenderness in the right lower rib region NEURO: alert & oriented x 3 with fluent speech, no focal motor/sensory deficits  LABORATORY DATA:  I Courtney reviewed the data as listed     Component Value Date/Time   NA 140 02/13/2024 0827   NA 141 03/21/2023 1115   K 2.6 (LL) 02/13/2024 0827   CL 101 02/13/2024 0827   CO2 27 02/13/2024 0827   GLUCOSE 66 (L) 02/13/2024 0827   BUN 23 02/13/2024 0827  BUN 20 03/21/2023 1115    CREATININE 0.90 02/13/2024 0827   CREATININE 0.74 09/24/2013 1119   CALCIUM 8.8 (L) 02/13/2024 0827   PROT 7.4 02/13/2024 0827   PROT 6.7 02/24/2023 1121   ALBUMIN 3.6 02/13/2024 0827   ALBUMIN 4.4 02/24/2023 1121   AST 23 02/13/2024 0827   ALT 22 02/13/2024 0827   ALKPHOS 61 02/13/2024 0827   BILITOT 0.6 02/13/2024 0827   BILITOT 0.4 02/24/2023 1121   GFRNONAA >60 02/13/2024 0827   GFRNONAA 86 09/24/2013 1119   GFRAA 80 07/15/2020 1113   GFRAA >89 09/24/2013 1119    Lab Results  Component Value Date   WBC 3.5 (L) 02/13/2024   NEUTROABS 2.2 02/13/2024   HGB 12.0 02/13/2024   HCT 36.1 02/13/2024   MCV 101.4 (H) 02/13/2024   PLT 133 (L) 02/13/2024    Latest Reference Range & Units 01/12/24 11:38  Kappa free light chain 3.3 - 19.4 mg/L 14.4  Lambda free light chains 5.7 - 26.3 mg/L 10.5  Kappa, lambda light chain ratio 0.26 - 1.65  1.37    Latest Reference Range & Units 01/12/24 11:37  Total Protein ELP 6.0 - 8.5 g/dL 6.0 - 8.5 g/dL 7.1 7.1  Albumin ELP 2.9 - 4.4 g/dL 3.8  Globulin, Total 2.2 - 3.9 g/dL 3.3 (C)  A/G Ratio 0.7 - 1.7  1.2 (C)  Alpha-1-Globulin 0.0 - 0.4 g/dL 0.2  Joeyj-7-Honalopw 0.4 - 1.0 g/dL 0.7  Beta Globulin 0.7 - 1.3 g/dL 1.0  Gamma Globulin 0.4 - 1.8 g/dL 1.4  M-SPIKE, % Not Observed g/dL 0.8 (H)  SPE Interp.  Immunofixation shows a biclonal IgA protein with kappa specificity   Comment  Comment  IgG (Immunoglobin G), Serum 586 - 1,602 mg/dL 153  IgM (Immunoglobulin M), Srm 26 - 217 mg/dL 35  IgA 64 - 577 mg/dL 8,672 (H)  (H): Data is abnormally high (C): Corrected  RADIOGRAPHIC STUDIES: I Courtney personally reviewed the radiological images as listed and agreed with the findings in the report.  NM PET Image Restag (PS) Skull Base To Thigh CLINICAL DATA:  Subsequent treatment strategy for hematological malignancy. Myeloma restaging  EXAM: NUCLEAR MEDICINE PET SKULL BASE TO THIGH  TECHNIQUE: 6.7 mCi F-18 FDG was injected intravenously.  Full-ring PET imaging was performed from the skull base to thigh after the radiotracer. CT data was obtained and used for attenuation correction and anatomic localization.  Fasting blood glucose: 113 mg/dl  COMPARISON:  None Available.  FINDINGS: NECK: No hypermetabolic lymph nodes in the neck.  Incidental CT findings: None.  CHEST: No hypermetabolic mediastinal or hilar nodes. No suspicious pulmonary nodules on the CT scan.  Incidental CT findings: Chronic scarring in the RIGHT lower lobe. No measurable nodularity.  ABDOMEN/PELVIS: No abnormal hypermetabolic activity within the liver, pancreas, adrenal glands, or spleen. No hypermetabolic lymph nodes in the abdomen or pelvis.  Incidental CT findings: None.  SKELETON: No focal hypermetabolic activity to suggest active myeloma  Incidental CT findings: No suspicious lytic lesions in the CT portion exam. Vertebral body augmentation in the L1 vertebral body  IMPRESSION: 1. No evidence of active myeloma on FDG PET scan. 2. No evidence of plasmacytoma. 3. Chronic scarring in the RIGHT lower lobe.  Electronically Signed   By: Jackquline Boxer M.D.   On: 01/11/2024 09:27

## 2024-02-13 ENCOUNTER — Inpatient Hospital Stay

## 2024-02-13 ENCOUNTER — Inpatient Hospital Stay (HOSPITAL_BASED_OUTPATIENT_CLINIC_OR_DEPARTMENT_OTHER): Admitting: Oncology

## 2024-02-13 ENCOUNTER — Ambulatory Visit (HOSPITAL_COMMUNITY)
Admission: RE | Admit: 2024-02-13 | Discharge: 2024-02-13 | Disposition: A | Discharge status: Home / Self Care | Source: Ambulatory Visit | Attending: Oncology

## 2024-02-13 ENCOUNTER — Encounter: Payer: Self-pay | Admitting: Oncology

## 2024-02-13 VITALS — BP 111/64 | HR 63 | Temp 97.5°F | Resp 18 | Wt 136.6 lb

## 2024-02-13 VITALS — BP 134/70 | HR 60 | Resp 19

## 2024-02-13 DIAGNOSIS — E876 Hypokalemia: Secondary | ICD-10-CM | POA: Diagnosis not present

## 2024-02-13 DIAGNOSIS — R0789 Other chest pain: Secondary | ICD-10-CM

## 2024-02-13 DIAGNOSIS — C9000 Multiple myeloma not having achieved remission: Secondary | ICD-10-CM

## 2024-02-13 DIAGNOSIS — D509 Iron deficiency anemia, unspecified: Secondary | ICD-10-CM

## 2024-02-13 DIAGNOSIS — G6289 Other specified polyneuropathies: Secondary | ICD-10-CM | POA: Diagnosis not present

## 2024-02-13 DIAGNOSIS — Z5112 Encounter for antineoplastic immunotherapy: Secondary | ICD-10-CM | POA: Diagnosis not present

## 2024-02-13 DIAGNOSIS — I2699 Other pulmonary embolism without acute cor pulmonale: Secondary | ICD-10-CM

## 2024-02-13 LAB — CBC WITH DIFFERENTIAL/PLATELET
Abs Immature Granulocytes: 0.02 K/uL (ref 0.00–0.07)
Basophils Absolute: 0.1 K/uL (ref 0.0–0.1)
Basophils Relative: 3 %
Eosinophils Absolute: 0.4 K/uL (ref 0.0–0.5)
Eosinophils Relative: 11 %
HCT: 36.1 % (ref 36.0–46.0)
Hemoglobin: 12 g/dL (ref 12.0–15.0)
Immature Granulocytes: 1 %
Lymphocytes Relative: 17 %
Lymphs Abs: 0.6 K/uL — ABNORMAL LOW (ref 0.7–4.0)
MCH: 33.7 pg (ref 26.0–34.0)
MCHC: 33.2 g/dL (ref 30.0–36.0)
MCV: 101.4 fL — ABNORMAL HIGH (ref 80.0–100.0)
Monocytes Absolute: 0.2 K/uL (ref 0.1–1.0)
Monocytes Relative: 5 %
Neutro Abs: 2.2 K/uL (ref 1.7–7.7)
Neutrophils Relative %: 63 %
Platelets: 133 K/uL — ABNORMAL LOW (ref 150–400)
RBC: 3.56 MIL/uL — ABNORMAL LOW (ref 3.87–5.11)
RDW: 16.2 % — ABNORMAL HIGH (ref 11.5–15.5)
WBC: 3.5 K/uL — ABNORMAL LOW (ref 4.0–10.5)
nRBC: 0 % (ref 0.0–0.2)

## 2024-02-13 LAB — COMPREHENSIVE METABOLIC PANEL WITH GFR
ALT: 22 U/L (ref 0–44)
AST: 23 U/L (ref 15–41)
Albumin: 3.6 g/dL (ref 3.5–5.0)
Alkaline Phosphatase: 61 U/L (ref 38–126)
Anion gap: 12 (ref 5–15)
BUN: 23 mg/dL (ref 8–23)
CO2: 27 mmol/L (ref 22–32)
Calcium: 8.8 mg/dL — ABNORMAL LOW (ref 8.9–10.3)
Chloride: 101 mmol/L (ref 98–111)
Creatinine, Ser: 0.9 mg/dL (ref 0.44–1.00)
GFR, Estimated: >60 mL/min (ref >60)
Glucose, Bld: 66 mg/dL — ABNORMAL LOW (ref 70–99)
Potassium: 2.6 mmol/L — CL (ref 3.5–5.1)
Sodium: 140 mmol/L (ref 135–145)
Total Bilirubin: 0.6 mg/dL (ref 0.0–1.2)
Total Protein: 7.4 g/dL (ref 6.5–8.1)

## 2024-02-13 LAB — GLUCOSE, CAPILLARY: Glucose-Capillary: 68 mg/dL — ABNORMAL LOW (ref 70–99)

## 2024-02-13 MED ORDER — PROCHLORPERAZINE MALEATE 10 MG PO TABS
10.0000 mg | ORAL_TABLET | Freq: Once | ORAL | Status: AC
  Administered 2024-02-13: 10 mg via ORAL
  Filled 2024-02-13: qty 1

## 2024-02-13 MED ORDER — ACETAMINOPHEN 325 MG PO TABS
650.0000 mg | ORAL_TABLET | Freq: Once | ORAL | Status: AC
  Administered 2024-02-13: 650 mg via ORAL
  Filled 2024-02-13: qty 2

## 2024-02-13 MED ORDER — POTASSIUM CHLORIDE IN NACL 20-0.9 MEQ/L-% IV SOLN
INTRAVENOUS | Status: DC
Start: 2024-02-13 — End: 2024-02-13
  Filled 2024-02-13 (×2): qty 1000

## 2024-02-13 MED ORDER — POTASSIUM CHLORIDE CRYS ER 20 MEQ PO TBCR: 40.0000 meq | EXTENDED_RELEASE_TABLET | Freq: Once | ORAL | Status: DC

## 2024-02-13 MED ORDER — DEXAMETHASONE 4 MG PO TABS
20.0000 mg | ORAL_TABLET | Freq: Once | ORAL | Status: AC
  Administered 2024-02-13: 20 mg via ORAL
  Filled 2024-02-13: qty 5

## 2024-02-13 MED ORDER — BORTEZOMIB CHEMO SQ INJECTION 3.5 MG (2.5MG/ML)
0.7000 mg/m2 | Freq: Once | INTRAMUSCULAR | Status: AC
  Administered 2024-02-13: 1.25 mg via SUBCUTANEOUS
  Filled 2024-02-13: qty 0.5

## 2024-02-13 MED ORDER — DARATUMUMAB-HYALURONIDASE-FIHJ 1800-30000 MG-UT/15ML ~~LOC~~ SOLN
1800.0000 mg | Freq: Once | SUBCUTANEOUS | Status: AC
  Administered 2024-02-13: 1800 mg via SUBCUTANEOUS
  Filled 2024-02-13: qty 15

## 2024-02-13 MED ORDER — POTASSIUM CHLORIDE CRYS ER 20 MEQ PO TBCR
40.0000 meq | EXTENDED_RELEASE_TABLET | ORAL | Status: AC
  Administered 2024-02-13 (×2): 40 meq via ORAL
  Filled 2024-02-13 (×2): qty 2

## 2024-02-13 MED ORDER — CETIRIZINE HCL 10 MG PO TABS
10.0000 mg | ORAL_TABLET | Freq: Once | ORAL | Status: AC
  Administered 2024-02-13: 10 mg via ORAL
  Filled 2024-02-13: qty 1

## 2024-02-13 NOTE — Progress Notes (Signed)
 Labs reviewed with MD today. Potassium  is low will give supplemental potassium as ordered. Ok to treat today , get EKG per MD order.    Treatment given per orders. Patient tolerated it well without problems. Vitals stable and discharged home from clinic via wheelchair. Follow up as scheduled.

## 2024-02-13 NOTE — Assessment & Plan Note (Signed)
 Potassium 2.6 today Patient denies nausea, vomiting, diarrhea or use of Lasix EKG reviewed today: No significant ST-T wave elevations  - Will administer potassium IV per protocol here today - Recommend patient to take 2 pills that is 40 mEq twice a day for 3 days followed by 20 mEq twice a day for the rest of the time -Will bring patient on Monday to recheck this levels -Will closely monitor potassium levels

## 2024-02-13 NOTE — Patient Instructions (Signed)
 [facility] [place] - A DEPT OF [facility]  Discharge Instructions: Thank you for choosing [facility] to provide your oncology and hematology care.  If you have a lab appointment with the Cancer Center - please note that after April 8th, 2024, all labs will be drawn in the cancer center.  You do not have to check in or register with the main entrance as you have in the past but will complete your check-in in the cancer center.  Wear comfortable clothing and clothing appropriate for easy access to any Portacath or PICC line.   We strive to give you quality time with your provider. You may need to reschedule your appointment if you arrive late (15 or more minutes).  Arriving late affects you and other patients whose appointments are after yours.  Also, if you miss three or more appointments without notifying the office, you may be dismissed from the clinic at the provider's discretion.      For prescription refill requests, have your pharmacy contact our office and allow 72 hours for refills to be completed.    Today you received the following chemotherapy and/or immunotherapy agents darzalex , velcade , hydration fluids with K+ supplements   To help prevent nausea and vomiting after your treatment, we encourage you to take your nausea medication as directed.  BELOW ARE SYMPTOMS THAT SHOULD BE REPORTED IMMEDIATELY: *FEVER GREATER THAN 100.4 F (38 C) OR HIGHER *CHILLS OR SWEATING *NAUSEA AND VOMITING THAT IS NOT CONTROLLED WITH YOUR NAUSEA MEDICATION *UNUSUAL SHORTNESS OF BREATH *UNUSUAL BRUISING OR BLEEDING *URINARY PROBLEMS (pain or burning when urinating, or frequent urination) *BOWEL PROBLEMS (unusual diarrhea, constipation, pain near the anus) TENDERNESS IN MOUTH AND THROAT WITH OR WITHOUT PRESENCE OF ULCERS (sore throat, sores in mouth, or a toothache) UNUSUAL RASH, SWELLING OR PAIN  UNUSUAL VAGINAL DISCHARGE OR ITCHING   Items with * indicate a  potential emergency and should be followed up as soon as possible or go to the Emergency Department if any problems should occur.  Please show the CHEMOTHERAPY ALERT CARD or IMMUNOTHERAPY ALERT CARD at check-in to the Emergency Department and triage nurse.  Should you have questions after your visit or need to cancel or reschedule your appointment, please contact [facility] [place] - A DEPT OF [facility] [phone]  and follow the prompts.  Office hours are 8:00 a.m. to 4:30 p.m. Monday - Friday. Please note that voicemails left after 4:00 p.m. may not be returned until the following business day.  We are closed weekends and major holidays. You have access to a nurse at all times for urgent questions. Please call the main number to the clinic [phone] and follow the prompts.  For any non-urgent questions, you may also contact your provider using MyChart. We now offer e-Visits for anyone 52 and older to request care online for non-urgent symptoms. For details visit [email or web address].   Also download the MyChart app! Go to the app store, search MyChart, open the app, select [facility], and log in with your MyChart username and password.

## 2024-02-13 NOTE — Assessment & Plan Note (Addendum)
 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 S/p induction with Dara VRD.  Patient refused transplantation as per documentation Maintenance on Velcade  and Revlimid  with recent worsening of multiple myeloma labs and was restarted on daratumumab  Currently on daratumumab , Velcade  and Revlimid   - Reviewed labs today.  CMP: Potassium: 2.6, creatinine: Normal, LFTs: Stable, CBC: WBC: 3.5, hemoglobin: 12, platelets: 133, ANC: 2200. - Continue daratumumab , Velcade  and Revlimid  as scheduled.  -Physical exam stable today.  Can proceed with chemotherapy today. - Continue acyclovir  twice daily - Will draw multiple myeloma labs with every other treatment  Return to clinic in 2 months, prior to treatment

## 2024-02-13 NOTE — Progress Notes (Signed)
 CRITICAL VALUE ALERT Critical value received:  potassium 2.6.  Date of notification:  02-13-2024 Time of notification: 09:00 am. Critical value read back:  Yes.   Nurse who received alert:  B.Kateryn Marasigan RN.  MD notified time and response:  Dr. Davonna @ 09:04am. Standing orders followed . Patient will receive KCL 20 mEq IV and K-dur 40 mEq PO pre and post IV KCL.

## 2024-02-13 NOTE — Assessment & Plan Note (Addendum)
 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

## 2024-02-13 NOTE — Assessment & Plan Note (Addendum)
 Patient has a history of PE and is on Coumadin  - Considering patient is on Revlimid , high risk of thrombosis, will need indefinite anticoagulation - Continue Coumadin as prescribed

## 2024-02-13 NOTE — Patient Instructions (Signed)
 [facility] at [facility] Discharge Instructions   You were seen and examined today by Dr. Davonna.  She reviewed the results of your lab work which are mostly normal/stable. Your potassium was very low. We will give you potassium in the clinic today. We will also get an EKG today.   We will proceed with your treatment today.   Return as scheduled.    Thank you for choosing [facility] at [facility] to provide your oncology and hematology care.  To afford each patient quality time with our provider, please arrive at least 15 minutes before your scheduled appointment time.   If you have a lab appointment with the Cancer Center please come in thru the Main Entrance and check in at the main information desk.  You need to re-schedule your appointment should you arrive 10 or more minutes late.  We strive to give you quality time with our providers, and arriving late affects you and other patients whose appointments are after yours.  Also, if you no show three or more times for appointments you may be dismissed from the clinic at the providers discretion.     Again, thank you for choosing [facility].  Our hope is that these requests will decrease the amount of time that you wait before being seen by our physicians.       _____________________________________________________________  Should you have questions after your visit to [facility], please contact our office at [phone] and follow the prompts.  Our office hours are 8:00 a.m. and 4:30 p.m. Monday - Friday.  Please note that voicemails left after 4:00 p.m. may not be returned until the following business day.  We are closed weekends and major holidays.  You do have access to a nurse 24-7, just call the main number to the clinic [phone] and do not press any options, hold on the line and a nurse will answer the phone.    For prescription refill requests, have  your pharmacy contact our office and allow 72 hours.    Due to Covid, you will need to wear a mask upon entering the hospital. If you do not have a mask, a mask will be given to you at the Main Entrance upon arrival. For doctor visits, patients may have 1 support person age 51 or older with them. For treatment visits, patients can not have anyone with them due to social distancing guidelines and our immunocompromised population.

## 2024-02-14 LAB — KAPPA/LAMBDA LIGHT CHAINS
Kappa free light chain: 20.4 mg/L — ABNORMAL HIGH (ref 3.3–19.4)
Kappa, lambda light chain ratio: 3 — ABNORMAL HIGH (ref 0.26–1.65)
Lambda free light chains: 6.8 mg/L (ref 5.7–26.3)

## 2024-02-16 ENCOUNTER — Other Ambulatory Visit: Payer: Self-pay

## 2024-02-16 DIAGNOSIS — C9000 Multiple myeloma not having achieved remission: Secondary | ICD-10-CM

## 2024-02-16 LAB — GLUCOSE, CAPILLARY
Glucose-Capillary: 68 mg/dL — ABNORMAL LOW (ref 70–99)
Glucose-Capillary: 80 mg/dL (ref 70–99)

## 2024-02-16 MED ORDER — LENALIDOMIDE 10 MG PO CAPS: ORAL_CAPSULE | ORAL | 0 refills | Status: DC

## 2024-02-17 LAB — MULTIPLE MYELOMA PANEL, SERUM
Albumin SerPl Elph-Mcnc: 3.5 g/dL (ref 2.9–4.4)
Albumin/Glob SerPl: 1.1 (ref 0.7–1.7)
Alpha 1: 0.3 g/dL (ref 0.0–0.4)
Alpha2 Glob SerPl Elph-Mcnc: 0.7 g/dL (ref 0.4–1.0)
B-Globulin SerPl Elph-Mcnc: 1 g/dL (ref 0.7–1.3)
Gamma Glob SerPl Elph-Mcnc: 1.5 g/dL (ref 0.4–1.8)
Globulin, Total: 3.5 g/dL (ref 2.2–3.9)
IgA: 1474 mg/dL — ABNORMAL HIGH (ref 64–422)
IgG (Immunoglobin G), Serum: 641 mg/dL (ref 586–1602)
IgM (Immunoglobulin M), Srm: 22 mg/dL — ABNORMAL LOW (ref 26–217)
M Protein SerPl Elph-Mcnc: 0.9 g/dL — ABNORMAL HIGH
Total Protein ELP: 7 g/dL (ref 6.0–8.5)

## 2024-02-19 ENCOUNTER — Inpatient Hospital Stay

## 2024-02-19 ENCOUNTER — Other Ambulatory Visit: Payer: Self-pay | Admitting: *Deleted

## 2024-02-19 DIAGNOSIS — Z5112 Encounter for antineoplastic immunotherapy: Secondary | ICD-10-CM | POA: Diagnosis not present

## 2024-02-19 DIAGNOSIS — C9000 Multiple myeloma not having achieved remission: Secondary | ICD-10-CM

## 2024-02-19 DIAGNOSIS — E876 Hypokalemia: Secondary | ICD-10-CM

## 2024-02-19 LAB — COMPREHENSIVE METABOLIC PANEL WITH GFR
ALT: 28 U/L (ref 0–44)
AST: 22 U/L (ref 15–41)
Albumin: 3.5 g/dL (ref 3.5–5.0)
Alkaline Phosphatase: 58 U/L (ref 38–126)
Anion gap: 12 (ref 5–15)
BUN: 25 mg/dL — ABNORMAL HIGH (ref 8–23)
CO2: 25 mmol/L (ref 22–32)
Calcium: 8.4 mg/dL — ABNORMAL LOW (ref 8.9–10.3)
Chloride: 97 mmol/L — ABNORMAL LOW (ref 98–111)
Creatinine, Ser: 0.84 mg/dL (ref 0.44–1.00)
GFR, Estimated: >60 mL/min (ref >60)
Glucose, Bld: 74 mg/dL (ref 70–99)
Potassium: 2.9 mmol/L — ABNORMAL LOW (ref 3.5–5.1)
Sodium: 134 mmol/L — ABNORMAL LOW (ref 135–145)
Total Bilirubin: 0.5 mg/dL (ref 0.0–1.2)
Total Protein: 6.9 g/dL (ref 6.5–8.1)

## 2024-02-19 MED ORDER — POTASSIUM CHLORIDE CRYS ER 20 MEQ PO TBCR: 20.0000 meq | EXTENDED_RELEASE_TABLET | Freq: Two times a day (BID) | ORAL | 3 refills | Status: DC

## 2024-02-21 ENCOUNTER — Telehealth: Payer: Self-pay

## 2024-02-21 ENCOUNTER — Telehealth: Payer: Self-pay | Admitting: Family Medicine

## 2024-02-21 NOTE — Telephone Encounter (Unsigned)
 Copied from CRM #[id number]. Topic: General - Other >> Feb 21, 2024 12:57 PM Donee H wrote: Reason for CRM: Patient called stating just recently had a visit. She states she need a copy of AMA and any other test that was in the folder. She stated AMA is therapy and has something to do with the way they treat disease. She would like a copy of it. Please follow up with patient on request [phone]

## 2024-02-21 NOTE — Telephone Encounter (Signed)
 Patient has come by the office and wants copy of test results for AMA? Pt said she seen Leita but she can not tell what test  Please call patient when it is ready or you can talk to the patient on what copy of results she is looking for.  Phone number is [phone]

## 2024-02-22 ENCOUNTER — Encounter: Payer: Self-pay | Admitting: Internal Medicine

## 2024-02-23 ENCOUNTER — Other Ambulatory Visit: Payer: Self-pay

## 2024-02-23 DIAGNOSIS — K589 Irritable bowel syndrome without diarrhea: Secondary | ICD-10-CM

## 2024-02-26 ENCOUNTER — Encounter: Payer: Self-pay | Admitting: Internal Medicine

## 2024-02-26 ENCOUNTER — Other Ambulatory Visit: Payer: Self-pay

## 2024-02-26 ENCOUNTER — Ambulatory Visit: Attending: Internal Medicine | Admitting: Internal Medicine

## 2024-02-26 VITALS — BP 102/60 | HR 63 | Ht 63.0 in | Wt 139.2 lb

## 2024-02-26 DIAGNOSIS — C9000 Multiple myeloma not having achieved remission: Secondary | ICD-10-CM

## 2024-02-26 DIAGNOSIS — I480 Paroxysmal atrial fibrillation: Secondary | ICD-10-CM

## 2024-02-26 DIAGNOSIS — Z7901 Long term (current) use of anticoagulants: Secondary | ICD-10-CM

## 2024-02-26 NOTE — Patient Instructions (Signed)
 Medication Instructions:   Stop Taking Aspirin   *If you need a refill on your cardiac medications before your next appointment, please call your pharmacy*  Lab Work: NONE   If you have labs (blood work) drawn today and your tests are completely normal, you will receive your results only by: MyChart Message (if you have MyChart) OR A paper copy in the mail If you have any lab test that is abnormal or we need to change your treatment, we will call you to review the results.  Testing/Procedures: NONE   Follow-Up: At [facility], you and your health needs are our priority.  As part of our continuing mission to provide you with exceptional heart care, our providers are all part of one team.  This team includes your primary Cardiologist (physician) and Advanced Practice Providers or APPs (Physician Assistants and Nurse Practitioners) who all work together to provide you with the care you need, when you need it.  Your next appointment:   1 year(s)  Provider:   You may see Vishnu P Mallipeddi, MD or one of the following Advanced Practice Providers on your designated Care Team:   [place], PA-C  Scotesia Albers, [place] Olivia Pavy, [place]     We recommend signing up for the patient portal called MyChart.  Sign up information is provided on this After Visit Summary.  MyChart is used to connect with patients for Virtual Visits (Telemedicine).  Patients are able to view lab/test results, encounter notes, upcoming appointments, etc.  Non-urgent messages can be sent to your provider as well.   To learn more about what you can do with MyChart, go to [email or web address].   Other Instructions Thank you for choosing [facility]!

## 2024-02-26 NOTE — Telephone Encounter (Signed)
 Call can not be completed at this time.

## 2024-02-27 ENCOUNTER — Inpatient Hospital Stay

## 2024-02-27 ENCOUNTER — Inpatient Hospital Stay: Attending: Hematology

## 2024-02-27 VITALS — BP 114/63 | HR 64 | Temp 97.9°F | Resp 18 | Wt 135.2 lb

## 2024-02-27 DIAGNOSIS — C9000 Multiple myeloma not having achieved remission: Secondary | ICD-10-CM | POA: Diagnosis present

## 2024-02-27 DIAGNOSIS — Z79899 Other long term (current) drug therapy: Secondary | ICD-10-CM | POA: Diagnosis not present

## 2024-02-27 DIAGNOSIS — Z5112 Encounter for antineoplastic immunotherapy: Secondary | ICD-10-CM | POA: Insufficient documentation

## 2024-02-27 DIAGNOSIS — D509 Iron deficiency anemia, unspecified: Secondary | ICD-10-CM

## 2024-02-27 LAB — CBC WITH DIFFERENTIAL/PLATELET
Abs Immature Granulocytes: 0.01 K/uL (ref 0.00–0.07)
Basophils Absolute: 0.1 K/uL (ref 0.0–0.1)
Basophils Relative: 2 %
Eosinophils Absolute: 0.4 K/uL (ref 0.0–0.5)
Eosinophils Relative: 13 %
HCT: 35.4 % — ABNORMAL LOW (ref 36.0–46.0)
Hemoglobin: 11.8 g/dL — ABNORMAL LOW (ref 12.0–15.0)
Immature Granulocytes: 0 %
Lymphocytes Relative: 25 %
Lymphs Abs: 0.7 K/uL (ref 0.7–4.0)
MCH: 33.8 pg (ref 26.0–34.0)
MCHC: 33.3 g/dL (ref 30.0–36.0)
MCV: 101.4 fL — ABNORMAL HIGH (ref 80.0–100.0)
Monocytes Absolute: 0.5 K/uL (ref 0.1–1.0)
Monocytes Relative: 18 %
Neutro Abs: 1.2 K/uL — ABNORMAL LOW (ref 1.7–7.7)
Neutrophils Relative %: 42 %
Platelets: 118 K/uL — ABNORMAL LOW (ref 150–400)
RBC: 3.49 MIL/uL — ABNORMAL LOW (ref 3.87–5.11)
RDW: 16.8 % — ABNORMAL HIGH (ref 11.5–15.5)
WBC: 2.8 K/uL — ABNORMAL LOW (ref 4.0–10.5)
nRBC: 0 % (ref 0.0–0.2)

## 2024-02-27 LAB — COMPREHENSIVE METABOLIC PANEL WITH GFR
ALT: 27 U/L (ref 0–44)
AST: 26 U/L (ref 15–41)
Albumin: 4.1 g/dL (ref 3.5–5.0)
Alkaline Phosphatase: 78 U/L (ref 38–126)
Anion gap: 13 (ref 5–15)
BUN: 28 mg/dL — ABNORMAL HIGH (ref 8–23)
CO2: 25 mmol/L (ref 22–32)
Calcium: 9 mg/dL (ref 8.9–10.3)
Chloride: 104 mmol/L (ref 98–111)
Creatinine, Ser: 1.06 mg/dL — ABNORMAL HIGH (ref 0.44–1.00)
GFR, Estimated: 55 mL/min — ABNORMAL LOW (ref >60)
Glucose, Bld: 79 mg/dL (ref 70–99)
Potassium: 3.9 mmol/L (ref 3.5–5.1)
Sodium: 141 mmol/L (ref 135–145)
Total Bilirubin: 0.2 mg/dL (ref 0.0–1.2)
Total Protein: 7.2 g/dL (ref 6.5–8.1)

## 2024-02-27 MED ORDER — DENOSUMAB 120 MG/1.7ML ~~LOC~~ SOLN
120.0000 mg | Freq: Once | SUBCUTANEOUS | Status: AC
  Administered 2024-02-27: 120 mg via SUBCUTANEOUS
  Filled 2024-02-27: qty 1.7

## 2024-02-27 MED ORDER — PROCHLORPERAZINE MALEATE 10 MG PO TABS
10.0000 mg | ORAL_TABLET | Freq: Once | ORAL | Status: AC
  Administered 2024-02-27: 10 mg via ORAL
  Filled 2024-02-27: qty 1

## 2024-02-27 MED ORDER — DARATUMUMAB-HYALURONIDASE-FIHJ 1800-30000 MG-UT/15ML ~~LOC~~ SOLN
1800.0000 mg | Freq: Once | SUBCUTANEOUS | Status: AC
  Administered 2024-02-27: 1800 mg via SUBCUTANEOUS
  Filled 2024-02-27: qty 15

## 2024-02-27 MED ORDER — BORTEZOMIB CHEMO SQ INJECTION 3.5 MG (2.5MG/ML)
0.7000 mg/m2 | Freq: Once | INTRAMUSCULAR | Status: AC
  Administered 2024-02-27: 1.25 mg via SUBCUTANEOUS
  Filled 2024-02-27: qty 0.5

## 2024-02-27 MED ORDER — DEXAMETHASONE 4 MG PO TABS
20.0000 mg | ORAL_TABLET | Freq: Once | ORAL | Status: AC
  Administered 2024-02-27: 20 mg via ORAL
  Filled 2024-02-27: qty 5

## 2024-02-27 MED ORDER — CETIRIZINE HCL 10 MG PO TABS
10.0000 mg | ORAL_TABLET | Freq: Once | ORAL | Status: AC
  Administered 2024-02-27: 10 mg via ORAL
  Filled 2024-02-27: qty 1

## 2024-02-27 MED ORDER — ACETAMINOPHEN 325 MG PO TABS
650.0000 mg | ORAL_TABLET | Freq: Once | ORAL | Status: AC
  Administered 2024-02-27: 650 mg via ORAL
  Filled 2024-02-27: qty 2

## 2024-02-27 NOTE — Progress Notes (Signed)
 Per Dr Armanda standing orders okay to treat with ANC 1.2. Treatment team made aware.   Courtney Rogers

## 2024-02-27 NOTE — Patient Instructions (Signed)
 [facility] [place] - A DEPT OF [facility]  Discharge Instructions: Thank you for choosing [facility] to provide your oncology and hematology care.  If you have a lab appointment with the Cancer Center - please note that after April 8th, 2024, all labs will be drawn in the cancer center.  You do not have to check in or register with the main entrance as you have in the past but will complete your check-in in the cancer center.  Wear comfortable clothing and clothing appropriate for easy access to any Portacath or PICC line.   We strive to give you quality time with your provider. You may need to reschedule your appointment if you arrive late (15 or more minutes).  Arriving late affects you and other patients whose appointments are after yours.  Also, if you miss three or more appointments without notifying the office, you may be dismissed from the clinic at the provider's discretion.      For prescription refill requests, have your pharmacy contact our office and allow 72 hours for refills to be completed.    Today you received the following chemotherapy and/or immunotherapy agents Darzalex  Faspro/Velcade /Xgeva        To help prevent nausea and vomiting after your treatment, we encourage you to take your nausea medication as directed.  BELOW ARE SYMPTOMS THAT SHOULD BE REPORTED IMMEDIATELY: *FEVER GREATER THAN 100.4 F (38 C) OR HIGHER *CHILLS OR SWEATING *NAUSEA AND VOMITING THAT IS NOT CONTROLLED WITH YOUR NAUSEA MEDICATION *UNUSUAL SHORTNESS OF BREATH *UNUSUAL BRUISING OR BLEEDING *URINARY PROBLEMS (pain or burning when urinating, or frequent urination) *BOWEL PROBLEMS (unusual diarrhea, constipation, pain near the anus) TENDERNESS IN MOUTH AND THROAT WITH OR WITHOUT PRESENCE OF ULCERS (sore throat, sores in mouth, or a toothache) UNUSUAL RASH, SWELLING OR PAIN  UNUSUAL VAGINAL DISCHARGE OR ITCHING   Items with * indicate a potential emergency and  should be followed up as soon as possible or go to the Emergency Department if any problems should occur.  Please show the CHEMOTHERAPY ALERT CARD or IMMUNOTHERAPY ALERT CARD at check-in to the Emergency Department and triage nurse.  Should you have questions after your visit or need to cancel or reschedule your appointment, please contact [facility] [place] - A DEPT OF [facility] [phone]  and follow the prompts.  Office hours are 8:00 a.m. to 4:30 p.m. Monday - Friday. Please note that voicemails left after 4:00 p.m. may not be returned until the following business day.  We are closed weekends and major holidays. You have access to a nurse at all times for urgent questions. Please call the main number to the clinic [phone] and follow the prompts.  For any non-urgent questions, you may also contact your provider using MyChart. We now offer e-Visits for anyone 8 and older to request care online for non-urgent symptoms. For details visit [email or web address].   Also download the MyChart app! Go to the app store, search MyChart, open the app, select [place], and log in with your MyChart username and password.

## 2024-02-27 NOTE — Progress Notes (Signed)
 Patient presents today for Darzalex  Faspro/Velcade /Xgeva  injections. Patient is in satisfactory condition with no new complaints voiced.  Vital signs are stable.  Labs reviewed and all labs are within treatment parameters.  We will proceed with treatment per MD orders.    Patient's Calcium noted to be 9.0 today. Patient denies any tooth or jaw pain and no major recent of future dental work at this time. Patient reports taking Calcium/Vit D supplements as directed.   Treatment given today per MD orders. Tolerated infusion without adverse affects. Vital signs stable. No complaints at this time. Discharged from clinic via wheelchair in stable condition. Alert and oriented x 3. F/U with [facility] as scheduled.

## 2024-02-27 NOTE — Telephone Encounter (Signed)
 Is stapled at in front office daughter said will pick up

## 2024-03-01 DIAGNOSIS — I480 Paroxysmal atrial fibrillation: Secondary | ICD-10-CM | POA: Insufficient documentation

## 2024-03-01 NOTE — Progress Notes (Signed)
 Cardiology Office Note  Date: 03/01/2024   ID: Courtney, Rogers 1950/12/22, MRN [id number]  PCP:  Edman Meade PEDLAR, FNP  Cardiologist:  Diannah SHAUNNA Maywood, MD Electrophysiologist:  None   History of Present Illness: Courtney Rogers is a 73 y.o. female  Records from her previous cardiologist, Dr. Ailene reviewed.  Patient underwent NM stress test in 2021 and 2025.  It showed a large fixed defect in the septal apical and inferior apical areas which were similar to the previous NM stress test done in March 2021.  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  Patient is pacer dependent, 4 months until ERI according to March 2025 prior cardiology office visit note.  She has a Medtronic dual-chamber pacemaker.  Plan was generator replacement according to the cardiology note.  She has a history of A-fib that she is not aware of.  Currently anticoagulated.  She has a history of pulmonary embolism back in 2015.  Reports that she has been anticoagulated since then.  I reviewed the echocardiogram results that was performed in June 2024, normal LVEF, G1 DD, mild LVH, mild MAC, mild MR, mild AR, mild TR with PASP 38 mmHg, mild to moderate PR, no LV thrombus and no evidence of pericardial effusion.  Patient denies having any symptoms now.  No angina, DOE, dizziness, syncope, leg swelling.  Past Medical History:  Diagnosis Date   Acute myocardial infarction [facility]) 2009   CAD/no stent medically managed   Allergy    Anxiety disorder    CAD (coronary artery disease)    Cancer (HCC)    multiple myeloma   CHF (congestive heart failure) (HCC) 959879   Glaucoma [id number]   Hyperlipidemia    Hypertension    IBS (irritable bowel syndrome)    Non-ulcer dyspepsia 04/30/2009   Qualifier: Diagnosis of  By: Harvey MD, Sandi L    Overactive bladder    PE (pulmonary embolism) 10/2012   left lung   Presence of permanent cardiac pacemaker    Sleep apnea     Past Surgical History:   Procedure Laterality Date   ABDOMINAL HYSTERECTOMY     BSO secondary to cyst     CARDIAC CATHETERIZATION     CATARACT EXTRACTION W/PHACO Left 11/16/2023   Procedure: PHACOEMULSIFICATION, CATARACT, WITH IOL INSERTION 5.45, 00:43.2;  Surgeon: Enola Feliciano Hugger, MD;  Location: [facility];  Service: Ophthalmology;  Laterality: Left;   COLONOSCOPY  12/2009   Dr. Rhoda, propofol , normal. Next TCS 12/2019   COLONOSCOPY N/A 05/22/2017   Examined portion ileum was normal, significant looping of colon, external hemorrhoids and rectal bleeding due to internal hemorrhoids, mild diverticulosis procedure: COLONOSCOPY;  Surgeon: Harvey Margo CROME, MD;  [facility];  Service: Endoscopy;  Laterality: N/A;  10:30am   COLONOSCOPY N/A 11/03/2023   Procedure: COLONOSCOPY;  Surgeon: Cindie Carlin POUR, DO;  Location: [facility];  Service: Endoscopy;  Laterality: N/A;  1:00 pm, asa 3   ESOPHAGOGASTRODUODENOSCOPY  05/11/2009   schatzki ring/small hiatal hernia/path:gastritis   ESOPHAGOGASTRODUODENOSCOPY N/A 05/22/2017   Multiple gastric polyps with biopsy benign fundic gland polyp, small bowel biopsy negative for celiac, gastric biopsy with mild gastritis but no H. pylori procedure: ESOPHAGOGASTRODUODENOSCOPY (EGD);  Surgeon: Harvey Margo CROME, MD;  [facility]: [facility];  Service: Endoscopy;  Laterality: N/A;   ESOPHAGOGASTRODUODENOSCOPY N/A 11/03/2023   Procedure: EGD (ESOPHAGOGASTRODUODENOSCOPY);  Surgeon: Cindie Carlin POUR, DO;  [facility]: [facility];  Service: Endoscopy;  Laterality: N/A;   ESOPHAGOGASTRODUODENOSCOPY (  EGD) WITH ESOPHAGEAL DILATION N/A 04/01/2013   DOQ:dumprulmz at the gastroesophageal juction/multiple small polyps/mild gastritis   GIVENS CAPSULE STUDY N/A 06/07/2017   Frequent gastric erosions, normal small bowel mucosa.  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     acyclovir  (ZOVIRAX ) 400 MG tablet TAKE ONE TABLET BY MOUTH TWICE DAILY 60 tablet 4   Calcium Carbonate-Vitamin D  (CALCIUM 600+D PO) Take 1 tablet by mouth 2 (two) times daily.     chlorthalidone (HYGROTON) 25 MG tablet Take 12.5 mg by mouth every morning.     cyanocobalamin  (VITAMIN B12) 1000 MCG tablet Take 1,000 mcg by mouth daily.     doxycycline  (VIBRA -TABS) 100 MG tablet      fluticasone  (FLONASE ) 50 MCG/ACT nasal spray USE 2 SPRAYS IN EACH NOSTRIL ONCE DAILY 16 g 0   folic acid  (FOLVITE ) 1 MG tablet Take 1 tablet (1 mg total) by mouth daily. 30 tablet 2   GNP VITAMIN C 500 MG tablet Take 500 mg by mouth daily.     levocetirizine (XYZAL ) 5 MG tablet TAKE ONE TABLET BY MOUTH IN THE EVENING 30 tablet 3   lidocaine  (LIDODERM ) 5 % Place 1 patch onto the skin daily. Remove & Discard patch within 12 hours or as directed by MD 30 patch 0   magnesium  oxide (MAG-OX) 400 (240 Mg) MG tablet TAKE ONE TABLET BY MOUTH TWICE DAILY 60 tablet 4   metoprolol  tartrate (LOPRESSOR ) 50 MG tablet Take 1 tablet (50 mg total) by mouth 2 (two) times daily. 180 tablet 1   MYRBETRIQ  50 MG TB24 tablet TAKE ONE TABLET BY MOUTH ONCE DAILY 30 tablet 3   nitroGLYCERIN (NITROSTAT) 0.4 MG SL tablet Place 0.4 mg under the tongue every 5 (five) minutes as needed for chest pain.     pantoprazole  (PROTONIX ) 40 MG tablet TAKE ONE TABLET BY MOUTH TWICE DAILY 90 tablet 3   potassium chloride  SA (KLOR-CON  M) 20 MEQ tablet Take 1 tablet (20 mEq total) by mouth 2 (two) times daily. 60 tablet 3   pregabalin  (LYRICA ) 50 MG capsule TAKE ONE CAPSULE BY MOUTH TWICE DAILY 60 capsule 4   primidone (MYSOLINE) 50  MG tablet TAKE ONE TABLET BY MOUTH TWICE DAILY 60 tablet 5   Probiotic Product (GNP PROBIOTIC COLON SUPPORT) CAPS Take 1 capsule by mouth daily.     sertraline  (ZOLOFT ) 50 MG tablet TAKE ONE TABLET BY MOUTH ONCE DAILY 30 tablet 3   warfarin (COUMADIN) 6 MG tablet Take 6 mg by mouth at bedtime.     amLODipine  (NORVASC ) 10 MG tablet Take 10 mg by mouth.     B Complex-Folic Acid  (B COMPLEX VITAMINS, W/ FA,) CAPS Take 1 capsule by mouth daily.     busPIRone  (BUSPAR ) 5 MG tablet Take 5 mg by mouth daily.     Calcium Carb-Cholecalciferol 600-10 MG-MCG TABS Take 1 tablet by mouth 2 (two) times daily.     citalopram  (CELEXA ) 20 MG tablet TAKE ONE TABLET BY MOUTH EVERY DAY 30 tablet 3  diclofenac  Sodium (VOLTAREN ) 1 % GEL Apply 4 g topically 4 (four) times daily. 50 g 0   dicyclomine  (BENTYL ) 10 MG capsule TAKE ONE CAPSULE BY MOUTH THREE TIMES A DAY BEFORE meals 90 capsule 2   FEROSUL 325 (65 Fe) MG tablet Take 325 mg by mouth daily.     lenalidomide  (REVLIMID ) 10 MG capsule Take 1 capsule (10 mg total) by mouth daily. 21 days on, 7 days off every 28 days 21 capsule 0   montelukast  (SINGULAIR ) 10 MG tablet TAKE ONE TABLET BY MOUTH AT BEDTIME 30 tablet 3   NICODERM CQ 14 MG/24HR patch 14 mg daily.     simvastatin  (ZOCOR ) 20 MG tablet Take 20 mg by mouth at bedtime.     topiramate (TOPAMAX) 25 MG tablet Take 25 mg by mouth 2 (two) times daily.     traMADol  (ULTRAM ) 50 MG tablet Take 1 tablet (50 mg total) by mouth every 6 (six) hours as needed. 20 tablet 0   warfarin (COUMADIN) 3 MG tablet Take 3 mg by mouth daily.     No current facility-administered medications for this visit.   Allergies:  Amoxicillin-pot clavulanate, Clarithromycin, Erythromycin, Lisinopril, and Amoxicillin   Social History: The patient  reports that she has never smoked. She has never been exposed to tobacco smoke. She has never used smokeless tobacco. She reports that she does not currently use alcohol. She reports that she does  not use drugs.   Family History: The patient's family history includes Anxiety disorder in her mother; Cancer in her mother; Heart disease in her father; Hypertension in her father.   ROS:  Please see the history of present illness. Otherwise, complete review of systems is positive for none.  All other systems are reviewed and negative.   Physical Exam: VS:  BP 102/60 (BP Location: Right Arm)   Pulse 63   Ht 5' 3 (1.6 m)   Wt 139 lb 3.2 oz (63.1 kg)   SpO2 94%   BMI 24.66 kg/m , BMI Body mass index is 24.66 kg/m.  Wt Readings from Last 3 Encounters:  02/27/24 135 lb 3.2 oz (61.3 kg)  02/26/24 139 lb 3.2 oz (63.1 kg)  02/13/24 136 lb 9.6 oz (62 kg)    General: Patient appears comfortable at rest. HEENT: Conjunctiva and lids normal, oropharynx clear with moist mucosa. Neck: Supple, no elevated JVP or carotid bruits, no thyromegaly. Lungs: Clear to auscultation, nonlabored breathing at rest. Cardiac: Regular rate and rhythm, no S3 or significant systolic murmur, no pericardial rub. Abdomen: Soft, nontender, no hepatomegaly, bowel sounds present, no guarding or rebound. Extremities: No pitting edema, distal pulses 2+. Skin: Warm and dry. Musculoskeletal: No kyphosis. Neuropsychiatric: Alert and oriented x3, affect grossly appropriate.  Recent Labwork: 11/20/2023: Magnesium  2.2 02/27/2024: ALT 27; AST 26; BUN 28; Creatinine, Ser 1.06; Hemoglobin 11.8; Platelets 118; Potassium 3.9; Sodium 141     Component Value Date/Time   CHOL 146 06/27/2022 1502   TRIG 102 06/27/2022 1502   HDL 65 06/27/2022 1502   CHOLHDL 2.2 06/27/2022 1502   LDLCALC 62 06/27/2022 1502     Assessment and Plan:  False positive cardiac stress test: No angina or DOE.  She underwent LHC in 2021 at the annual that showed nonobstructive CAD.  Pacemaker in place: Patient has Medtronic dual-chamber pacemaker implanted.  Unknown details.  I reviewed her previous cardiologist's note who mentioned about generator  replacement.  She is pacer dependent.  EKG from 02/13/2024 showed AV dual paced rhythm.  Will place EP referral for device checks.  A-fib: Patient is not aware that she has a history of A-fib.  EKG from 02/13/2024 showed AV dual paced rhythm.  Not in A-fib now.  On Coumadin.  Refer to Coumadin clinic for INR monitoring.  She also has been taking primidone in addition to Coumadin for a long time and did not have any issues.  History of pulmonary embolism in 2015: Patient has been on systemic AC since 2015.  Unclear if this was provoked or unprovoked.  Valvular heart disease (mild MR, mild AR, mild to moderate PR, mild TR) in a 2024: Asymptomatic.  Can repeat echocardiogram in 3 to 5 years.  45 minutes spent in reviewing prior medical records, more than 3 labs, discussion of the above problems with the patient and her daughter and documentation.  Medication Adjustments/Labs and Tests Ordered: Current medicines are reviewed at length with the patient today.  Concerns regarding medicines are outlined above.    Disposition:  Follow up 1 year  Signed, Seaton Hofmann Arleta Maywood, MD, 03/01/2024 3:04 PM    [facility] at [facility] [address] [place], [place] [address]

## 2024-03-05 ENCOUNTER — Telehealth: Payer: Self-pay

## 2024-03-05 ENCOUNTER — Ambulatory Visit: Attending: Cardiology | Admitting: *Deleted

## 2024-03-05 DIAGNOSIS — Z7901 Long term (current) use of anticoagulants: Secondary | ICD-10-CM

## 2024-03-05 DIAGNOSIS — I480 Paroxysmal atrial fibrillation: Secondary | ICD-10-CM | POA: Diagnosis not present

## 2024-03-05 DIAGNOSIS — Z86711 Personal history of pulmonary embolism: Secondary | ICD-10-CM | POA: Diagnosis not present

## 2024-03-05 DIAGNOSIS — Z5181 Encounter for therapeutic drug level monitoring: Secondary | ICD-10-CM

## 2024-03-05 LAB — POCT INR: INR: 3.4 — AB (ref 2.0–3.0)

## 2024-03-05 NOTE — Telephone Encounter (Signed)
-----   Message from Vishnu P Mallipeddi sent at 03/01/2024  3:42 PM EDT ----- Regarding: Needs EP referral for device checks Can you please place EP referral for device checks.  She has Medtronic dual-chamber pacemaker.

## 2024-03-05 NOTE — Progress Notes (Signed)
 INR 3.4  Please see anticoagulation encounter

## 2024-03-05 NOTE — Patient Instructions (Signed)
 Previously checked in [place].  Has been taking warfarin 6mg  daily. Hold warfarin tonight then resume 1 tablet (6mg ) daily Recheck in 3 weeks

## 2024-03-06 ENCOUNTER — Other Ambulatory Visit: Payer: Self-pay | Admitting: Oncology

## 2024-03-06 DIAGNOSIS — C9000 Multiple myeloma not having achieved remission: Secondary | ICD-10-CM

## 2024-03-09 ENCOUNTER — Ambulatory Visit: Payer: Self-pay

## 2024-03-11 ENCOUNTER — Other Ambulatory Visit: Payer: Self-pay

## 2024-03-11 DIAGNOSIS — D509 Iron deficiency anemia, unspecified: Secondary | ICD-10-CM

## 2024-03-11 DIAGNOSIS — C9000 Multiple myeloma not having achieved remission: Secondary | ICD-10-CM

## 2024-03-12 ENCOUNTER — Inpatient Hospital Stay

## 2024-03-12 ENCOUNTER — Encounter: Payer: Self-pay | Admitting: Oncology

## 2024-03-12 VITALS — BP 110/59 | HR 67 | Temp 97.9°F | Resp 18 | Wt 133.0 lb

## 2024-03-12 DIAGNOSIS — D509 Iron deficiency anemia, unspecified: Secondary | ICD-10-CM

## 2024-03-12 DIAGNOSIS — E876 Hypokalemia: Secondary | ICD-10-CM

## 2024-03-12 DIAGNOSIS — C9000 Multiple myeloma not having achieved remission: Secondary | ICD-10-CM

## 2024-03-12 DIAGNOSIS — Z5112 Encounter for antineoplastic immunotherapy: Secondary | ICD-10-CM | POA: Diagnosis not present

## 2024-03-12 LAB — COMPREHENSIVE METABOLIC PANEL WITH GFR
ALT: 22 U/L (ref 0–44)
AST: 23 U/L (ref 15–41)
Albumin: 4.2 g/dL (ref 3.5–5.0)
Alkaline Phosphatase: 52 U/L (ref 38–126)
Anion gap: 11 (ref 5–15)
BUN: 12 mg/dL (ref 8–23)
CO2: 29 mmol/L (ref 22–32)
Calcium: 8.8 mg/dL — ABNORMAL LOW (ref 8.9–10.3)
Chloride: 100 mmol/L (ref 98–111)
Creatinine, Ser: 0.84 mg/dL (ref 0.44–1.00)
GFR, Estimated: >60 mL/min (ref >60)
Glucose, Bld: 85 mg/dL (ref 70–99)
Potassium: 3 mmol/L — ABNORMAL LOW (ref 3.5–5.1)
Sodium: 140 mmol/L (ref 135–145)
Total Bilirubin: 0.3 mg/dL (ref 0.0–1.2)
Total Protein: 7.4 g/dL (ref 6.5–8.1)

## 2024-03-12 LAB — CBC WITH DIFFERENTIAL/PLATELET
Abs Immature Granulocytes: 0.01 K/uL (ref 0.00–0.07)
Basophils Absolute: 0 K/uL (ref 0.0–0.1)
Basophils Relative: 1 %
Eosinophils Absolute: 0.3 K/uL (ref 0.0–0.5)
Eosinophils Relative: 10 %
HCT: 36.7 % (ref 36.0–46.0)
Hemoglobin: 12.6 g/dL (ref 12.0–15.0)
Immature Granulocytes: 0 %
Lymphocytes Relative: 30 %
Lymphs Abs: 0.8 K/uL (ref 0.7–4.0)
MCH: 34.3 pg — ABNORMAL HIGH (ref 26.0–34.0)
MCHC: 34.3 g/dL (ref 30.0–36.0)
MCV: 100 fL (ref 80.0–100.0)
Monocytes Absolute: 0.2 K/uL (ref 0.1–1.0)
Monocytes Relative: 7 %
Neutro Abs: 1.4 K/uL — ABNORMAL LOW (ref 1.7–7.7)
Neutrophils Relative %: 52 %
Platelets: 118 K/uL — ABNORMAL LOW (ref 150–400)
RBC: 3.67 MIL/uL — ABNORMAL LOW (ref 3.87–5.11)
RDW: 16.1 % — ABNORMAL HIGH (ref 11.5–15.5)
WBC: 2.8 K/uL — ABNORMAL LOW (ref 4.0–10.5)
nRBC: 0 % (ref 0.0–0.2)

## 2024-03-12 MED ORDER — DEXAMETHASONE 4 MG PO TABS
20.0000 mg | ORAL_TABLET | Freq: Once | ORAL | Status: AC
  Administered 2024-03-12: 20 mg via ORAL
  Filled 2024-03-12: qty 5

## 2024-03-12 MED ORDER — BORTEZOMIB CHEMO SQ INJECTION 3.5 MG (2.5MG/ML)
0.7000 mg/m2 | Freq: Once | INTRAMUSCULAR | Status: AC
  Administered 2024-03-12: 1.25 mg via SUBCUTANEOUS
  Filled 2024-03-12: qty 0.5

## 2024-03-12 MED ORDER — PROCHLORPERAZINE MALEATE 10 MG PO TABS
10.0000 mg | ORAL_TABLET | Freq: Once | ORAL | Status: AC
  Administered 2024-03-12: 10 mg via ORAL
  Filled 2024-03-12: qty 1

## 2024-03-12 MED ORDER — CETIRIZINE HCL 10 MG PO TABS
10.0000 mg | ORAL_TABLET | Freq: Once | ORAL | Status: AC
  Administered 2024-03-12: 10 mg via ORAL
  Filled 2024-03-12: qty 1

## 2024-03-12 MED ORDER — POTASSIUM CHLORIDE CRYS ER 20 MEQ PO TBCR
40.0000 meq | EXTENDED_RELEASE_TABLET | Freq: Once | ORAL | Status: AC
  Administered 2024-03-12: 40 meq via ORAL
  Filled 2024-03-12: qty 2

## 2024-03-12 MED ORDER — ACETAMINOPHEN 325 MG PO TABS
650.0000 mg | ORAL_TABLET | Freq: Once | ORAL | Status: AC
  Administered 2024-03-12: 650 mg via ORAL
  Filled 2024-03-12: qty 2

## 2024-03-12 MED ORDER — DARATUMUMAB-HYALURONIDASE-FIHJ 1800-30000 MG-UT/15ML ~~LOC~~ SOLN
1800.0000 mg | Freq: Once | SUBCUTANEOUS | Status: AC
  Administered 2024-03-12: 1800 mg via SUBCUTANEOUS
  Filled 2024-03-12: qty 15

## 2024-03-12 NOTE — Progress Notes (Signed)
 Velcade  and Darzalex  Faspro given today per MD orders. Tolerated without adverse affects. Vital signs stable. No complaints at this time. Discharged from clinic by wheel chair in stable condition. Alert and oriented x 3. F/U with [facility] as scheduled.

## 2024-03-12 NOTE — Patient Instructions (Signed)
 [facility] - A DEPT OF [facility]  Discharge Instructions: Thank you for choosing [facility] to provide your oncology and hematology care.  If you have a lab appointment with the Cancer Center - please note that after April 8th, 2024, all labs will be drawn in the cancer center.  You do not have to check in or register with the main entrance as you have in the past but will complete your check-in in the cancer center.  Wear comfortable clothing and clothing appropriate for easy access to any Portacath or PICC line.   We strive to give you quality time with your provider. You may need to reschedule your appointment if you arrive late (15 or more minutes).  Arriving late affects you and other patients whose appointments are after yours.  Also, if you miss three or more appointments without notifying the office, you may be dismissed from the clinic at the provider's discretion.      For prescription refill requests, have your pharmacy contact our office and allow 72 hours for refills to be completed.    Today you received the following chemotherapy and/or immunotherapy agents Velcade  and Darzalex  Faspro.  Daratumumab ; Hyaluronidase  Injection What is this medication? DARATUMUMAB ; HYALURONIDASE  (dar a toom ue mab; hye al ur ON i dase) treats multiple myeloma, a type of bone marrow cancer. Daratumumab  works by blocking a protein that causes cancer cells to grow and multiply. This helps to slow or stop the spread of cancer cells. Hyaluronidase  works by increasing the absorption of other medications in the body to help them work better. This medication may also be used treat amyloidosis, a condition that causes the buildup of a protein (amyloid) in your body. It works by reducing the buildup of this protein, which decreases symptoms. It is a combination medication that contains a monoclonal antibody. This medicine may be used for other purposes; ask your health  care provider or pharmacist if you have questions. COMMON BRAND NAME(S): DARZALEX  FASPRO What should I tell my care team before I take this medication? They need to know if you have any of these conditions: Heart disease Infection, such as chickenpox, cold sores, herpes, hepatitis B Lung or breathing disease An unusual or allergic reaction to daratumumab , hyaluronidase , other medications, foods, dyes, or preservatives Pregnant or trying to get pregnant Breast-feeding How should I use this medication? This medication is injected under the skin. It is given by your care team in a hospital or clinic setting. Talk to your care team about the use of this medication in children. Special care may be needed. Overdosage: If you think you have taken too much of this medicine contact a poison control center or emergency room at once. NOTE: This medicine is only for you. Do not share this medicine with others. What if I miss a dose? Keep appointments for follow-up doses. It is important not to miss your dose. Call your care team if you are unable to keep an appointment. What may interact with this medication? Interactions have not been studied. This list may not describe all possible interactions. Give your health care provider a list of all the medicines, herbs, non-prescription drugs, or dietary supplements you use. Also tell them if you smoke, drink alcohol, or use illegal drugs. Some items may interact with your medicine. What should I watch for while using this medication? Your condition will be monitored carefully while you are receiving this medication. This medication can  cause serious allergic reactions. To reduce your risk, your care team may give you other medication to take before receiving this one. Be sure to follow the directions from your care team. This medication can affect the results of blood tests to match your blood type. These changes can last for up to 6 months after the final  dose. Your care team will do blood tests to match your blood type before you start treatment. Tell all of your care team that you are being treated with this medication before receiving a blood transfusion. This medication can affect the results of some tests used to determine treatment response; extra tests may be needed to evaluate response. Talk to your care team if you wish to become pregnant or think you are pregnant. This medication can cause serious birth defects if taken during pregnancy and for 3 months after the last dose. A reliable form of contraception is recommended while taking this medication and for 3 months after the last dose. Talk to your care team about effective forms of contraception. Do not breast-feed while taking this medication. What side effects may I notice from receiving this medication? Side effects that you should report to your care team as soon as possible: Allergic reactions--skin rash, itching, hives, swelling of the face, lips, tongue, or throat Heart rhythm changes--fast or irregular heartbeat, dizziness, feeling faint or lightheaded, chest pain, trouble breathing Infection--fever, chills, cough, sore throat, wounds that don't heal, pain or trouble when passing urine, general feeling of discomfort or being unwell Infusion reactions--chest pain, shortness of breath or trouble breathing, feeling faint or lightheaded Sudden eye pain or change in vision such as blurry vision, seeing halos around lights, vision loss Unusual bruising or bleeding Side effects that usually do not require medical attention (report to your care team if they continue or are bothersome): Constipation Diarrhea Fatigue Nausea Pain, tingling, or numbness in the hands or feet Swelling of the ankles, hands, or feet This list may not describe all possible side effects. Call your doctor for medical advice about side effects. You may report side effects to FDA at 1-800-FDA-1088. Where should I  keep my medication? This medication is given in a hospital or clinic. It will not be stored at home. NOTE: This sheet is a summary. It may not cover all possible information. If you have questions about this medicine, talk to your doctor, pharmacist, or health care provider.  2024 Elsevier/Gold Standard (2021-09-14 00:00:00)Bortezomib  Injection What is this medication? BORTEZOMIB  (bor TEZ oh mib) treats lymphoma. It may also be used to treat multiple myeloma, a type of bone marrow cancer. It works by blocking a protein that causes cancer cells to grow and multiply. This helps to slow or stop the spread of cancer cells. This medicine may be used for other purposes; ask your health care provider or pharmacist if you have questions. COMMON BRAND NAME(S): BORUZU , Velcade  What should I tell my care team before I take this medication? They need to know if you have any of these conditions: Dehydration Diabetes Heart disease Liver disease Tingling of the fingers or toes or other nerve disorder An unusual or allergic reaction to bortezomib , other medications, foods, dyes, or preservatives If you or your partner are pregnant or trying to get pregnant Breastfeeding How should I use this medication? This medication is injected into a vein or under the skin. It is given by your care team in a hospital or clinic setting. Talk to your care team about the use  of this medication in children. Special care may be needed. Overdosage: If you think you have taken too much of this medicine contact a poison control center or emergency room at once. NOTE: This medicine is only for you. Do not share this medicine with others. What if I miss a dose? Keep appointments for follow-up doses. It is important not to miss your dose. Call your care team if you are unable to keep an appointment. What may interact with this medication? Ketoconazole Rifampin This list may not describe all possible interactions. Give your  health care provider a list of all the medicines, herbs, non-prescription drugs, or dietary supplements you use. Also tell them if you smoke, drink alcohol, or use illegal drugs. Some items may interact with your medicine. What should I watch for while using this medication? Your condition will be monitored carefully while you are receiving this medication. You may need blood work while taking this medication. This medication may affect your coordination, reaction time, or judgment. Do not drive or operate machinery until you know how this medication affects you. Sit up or stand slowly to reduce the risk of dizzy or fainting spells. Drinking alcohol with this medication can increase the risk of these side effects. This medication may increase your risk of getting an infection. Call your care team for advice if you get a fever, chills, sore throat, or other symptoms of a cold or flu. Do not treat yourself. Try to avoid being around people who are sick. Check with your care team if you have severe diarrhea, nausea, and vomiting, or if you sweat a lot. The loss of too much body fluid may make it dangerous for you to take this medication. Talk to your care team if you may be pregnant. Serious birth defects can occur if you take this medication during pregnancy and for 7 months after the last dose. You will need a negative pregnancy test before starting this medication. Contraception is recommended while taking this medication and for 7 months after the last dose. Your care team can help you find the option that works for you. If your partner can get pregnant, use a condom during sex while taking this medication and for 4 months after the last dose. Do not breastfeed while taking this medication and for 2 months after the last dose. This medication may cause infertility. Talk to your care team if you are concerned about your fertility. What side effects may I notice from receiving this medication? Side effects  that you should report to your care team as soon as possible: Allergic reactions--skin rash, itching, hives, swelling of the face, lips, tongue, or throat Bleeding--bloody or black, tar-like stools, vomiting blood or brown material that looks like coffee grounds, red or dark brown urine, small red or purple spots on skin, unusual bruising or bleeding Bleeding in the brain--severe headache, stiff neck, confusion, dizziness, change in vision, numbness or weakness of the face, arm, or leg, trouble speaking, trouble walking, vomiting Bowel blockage--stomach cramping, unable to have a bowel movement or pass gas, loss of appetite, vomiting Heart failure--shortness of breath, swelling of the ankles, feet, or hands, sudden weight gain, unusual weakness or fatigue Infection--fever, chills, cough, sore throat, wounds that don't heal, pain or trouble when passing urine, general feeling of discomfort or being unwell Liver injury--right upper belly pain, loss of appetite, nausea, light-colored stool, dark yellow or brown urine, yellowing skin or eyes, unusual weakness or fatigue Low blood pressure--dizziness, feeling faint or lightheaded,  blurry vision Lung injury--shortness of breath or trouble breathing, cough, spitting up blood, chest pain, fever Pain, tingling, or numbness in the hands or feet Severe or prolonged diarrhea Stomach pain, bloody diarrhea, pale skin, unusual weakness or fatigue, decrease in the amount of urine, which may be signs of hemolytic uremic syndrome Sudden and severe headache, confusion, change in vision, seizures, which may be signs of posterior reversible encephalopathy syndrome (PRES) TTP--purple spots on the skin or inside the mouth, pale skin, yellowing skin or eyes, unusual weakness or fatigue, fever, fast or irregular heartbeat, confusion, change in vision, trouble speaking, trouble walking Tumor lysis syndrome (TLS)--nausea, vomiting, diarrhea, decrease in the amount of urine,  dark urine, unusual weakness or fatigue, confusion, muscle pain or cramps, fast or irregular heartbeat, joint pain Side effects that usually do not require medical attention (report to your care team if they continue or are bothersome): Constipation Diarrhea Fatigue Loss of appetite Nausea This list may not describe all possible side effects. Call your doctor for medical advice about side effects. You may report side effects to FDA at 1-800-FDA-1088. Where should I keep my medication? This medication is given in a hospital or clinic. It will not be stored at home. NOTE: This sheet is a summary. It may not cover all possible information. If you have questions about this medicine, talk to your doctor, pharmacist, or health care provider.  2024 Elsevier/Gold Standard (2021-10-12 00:00:00)      To help prevent nausea and vomiting after your treatment, we encourage you to take your nausea medication as directed.  BELOW ARE SYMPTOMS THAT SHOULD BE REPORTED IMMEDIATELY: *FEVER GREATER THAN 100.4 F (38 C) OR HIGHER *CHILLS OR SWEATING *NAUSEA AND VOMITING THAT IS NOT CONTROLLED WITH YOUR NAUSEA MEDICATION *UNUSUAL SHORTNESS OF BREATH *UNUSUAL BRUISING OR BLEEDING *URINARY PROBLEMS (pain or burning when urinating, or frequent urination) *BOWEL PROBLEMS (unusual diarrhea, constipation, pain near the anus) TENDERNESS IN MOUTH AND THROAT WITH OR WITHOUT PRESENCE OF ULCERS (sore throat, sores in mouth, or a toothache) UNUSUAL RASH, SWELLING OR PAIN  UNUSUAL VAGINAL DISCHARGE OR ITCHING   Items with * indicate a potential emergency and should be followed up as soon as possible or go to the Emergency Department if any problems should occur.  Please show the CHEMOTHERAPY ALERT CARD or IMMUNOTHERAPY ALERT CARD at check-in to the Emergency Department and triage nurse.  Should you have questions after your visit or need to cancel or reschedule your appointment, please contact [facility] [place] -  A DEPT OF [facility] [phone]  and follow the prompts.  Office hours are 8:00 a.m. to 4:30 p.m. Monday - Friday. Please note that voicemails left after 4:00 p.m. may not be returned until the following business day.  We are closed weekends and major holidays. You have access to a nurse at all times for urgent questions. Please call the main number to the clinic [phone] and follow the prompts.  For any non-urgent questions, you may also contact your provider using MyChart. We now offer e-Visits for anyone 81 and older to request care online for non-urgent symptoms. For details visit [email or web address].   Also download the MyChart app! Go to the app store, search MyChart, open the app, select [place], and log in with your MyChart username and password.

## 2024-03-12 NOTE — Progress Notes (Signed)
 Patient presents today for chemotherapy injection of Daratuzumab and Velcade  .  Patient is in satisfactory condition with no new complaints voiced.  Vital signs are stable.  Labs reviewed and all labs are within treatment parameters with exception of ANC 1.4. Dr Davonna aware, okay to treat per her order. Patient's potassium 3.0. K-dur 40mEq PO to be given per MD order. We will proceed with treatment per MD orders.

## 2024-03-20 ENCOUNTER — Other Ambulatory Visit: Payer: Self-pay | Admitting: Family Medicine

## 2024-03-20 ENCOUNTER — Other Ambulatory Visit: Payer: Self-pay

## 2024-03-20 DIAGNOSIS — J301 Allergic rhinitis due to pollen: Secondary | ICD-10-CM

## 2024-03-20 DIAGNOSIS — R1084 Generalized abdominal pain: Secondary | ICD-10-CM

## 2024-03-20 MED ORDER — LENALIDOMIDE 10 MG PO CAPS: ORAL_CAPSULE | ORAL | 0 refills | Status: DC

## 2024-03-20 NOTE — Progress Notes (Signed)
 Chart reviewed. Revlimid  refilled per last office note with Dr. Davonna.

## 2024-03-25 ENCOUNTER — Other Ambulatory Visit: Payer: Self-pay

## 2024-03-25 DIAGNOSIS — D509 Iron deficiency anemia, unspecified: Secondary | ICD-10-CM

## 2024-03-25 DIAGNOSIS — C9000 Multiple myeloma not having achieved remission: Secondary | ICD-10-CM

## 2024-03-26 ENCOUNTER — Inpatient Hospital Stay

## 2024-03-26 ENCOUNTER — Inpatient Hospital Stay: Attending: Hematology

## 2024-03-26 VITALS — BP 112/71 | HR 70 | Temp 96.6°F | Resp 18 | Wt 133.4 lb

## 2024-03-26 DIAGNOSIS — Z79899 Other long term (current) drug therapy: Secondary | ICD-10-CM | POA: Diagnosis not present

## 2024-03-26 DIAGNOSIS — C9000 Multiple myeloma not having achieved remission: Secondary | ICD-10-CM | POA: Insufficient documentation

## 2024-03-26 DIAGNOSIS — Z5112 Encounter for antineoplastic immunotherapy: Secondary | ICD-10-CM | POA: Diagnosis present

## 2024-03-26 DIAGNOSIS — D509 Iron deficiency anemia, unspecified: Secondary | ICD-10-CM

## 2024-03-26 LAB — COMPREHENSIVE METABOLIC PANEL WITH GFR
ALT: 24 U/L (ref 0–44)
AST: 27 U/L (ref 15–41)
Albumin: 4 g/dL (ref 3.5–5.0)
Alkaline Phosphatase: 58 U/L (ref 38–126)
Anion gap: 11 (ref 5–15)
BUN: 16 mg/dL (ref 8–23)
CO2: 26 mmol/L (ref 22–32)
Calcium: 8.6 mg/dL — ABNORMAL LOW (ref 8.9–10.3)
Chloride: 101 mmol/L (ref 98–111)
Creatinine, Ser: 0.85 mg/dL (ref 0.44–1.00)
GFR, Estimated: >60 mL/min (ref >60)
Glucose, Bld: 82 mg/dL (ref 70–99)
Potassium: 3.2 mmol/L — ABNORMAL LOW (ref 3.5–5.1)
Sodium: 139 mmol/L (ref 135–145)
Total Bilirubin: 0.4 mg/dL (ref 0.0–1.2)
Total Protein: 7.7 g/dL (ref 6.5–8.1)

## 2024-03-26 LAB — CBC WITH DIFFERENTIAL/PLATELET
Abs Immature Granulocytes: 0.01 K/uL (ref 0.00–0.07)
Basophils Absolute: 0 K/uL (ref 0.0–0.1)
Basophils Relative: 1 %
Eosinophils Absolute: 0.2 K/uL (ref 0.0–0.5)
Eosinophils Relative: 7 %
HCT: 35.8 % — ABNORMAL LOW (ref 36.0–46.0)
Hemoglobin: 12 g/dL (ref 12.0–15.0)
Immature Granulocytes: 0 %
Lymphocytes Relative: 27 %
Lymphs Abs: 0.9 K/uL (ref 0.7–4.0)
MCH: 33.7 pg (ref 26.0–34.0)
MCHC: 33.5 g/dL (ref 30.0–36.0)
MCV: 100.6 fL — ABNORMAL HIGH (ref 80.0–100.0)
Monocytes Absolute: 0.4 K/uL (ref 0.1–1.0)
Monocytes Relative: 14 %
Neutro Abs: 1.6 K/uL — ABNORMAL LOW (ref 1.7–7.7)
Neutrophils Relative %: 51 %
Platelets: 150 K/uL (ref 150–400)
RBC: 3.56 MIL/uL — ABNORMAL LOW (ref 3.87–5.11)
RDW: 15.9 % — ABNORMAL HIGH (ref 11.5–15.5)
WBC: 3.2 K/uL — ABNORMAL LOW (ref 4.0–10.5)
nRBC: 0 % (ref 0.0–0.2)

## 2024-03-26 LAB — MAGNESIUM: Magnesium: 2.4 mg/dL (ref 1.7–2.4)

## 2024-03-26 MED ORDER — PROCHLORPERAZINE MALEATE 10 MG PO TABS
10.0000 mg | ORAL_TABLET | Freq: Once | ORAL | Status: AC
  Administered 2024-03-26: 10 mg via ORAL
  Filled 2024-03-26: qty 1

## 2024-03-26 MED ORDER — DEXAMETHASONE 4 MG PO TABS
20.0000 mg | ORAL_TABLET | Freq: Once | ORAL | Status: AC
  Administered 2024-03-26: 20 mg via ORAL
  Filled 2024-03-26: qty 5

## 2024-03-26 MED ORDER — DENOSUMAB 120 MG/1.7ML ~~LOC~~ SOLN
120.0000 mg | Freq: Once | SUBCUTANEOUS | Status: AC
  Administered 2024-03-26: 120 mg via SUBCUTANEOUS
  Filled 2024-03-26: qty 1.7

## 2024-03-26 MED ORDER — POTASSIUM CHLORIDE CRYS ER 20 MEQ PO TBCR
40.0000 meq | EXTENDED_RELEASE_TABLET | Freq: Once | ORAL | Status: AC
  Administered 2024-03-26: 40 meq via ORAL
  Filled 2024-03-26: qty 2

## 2024-03-26 MED ORDER — ACETAMINOPHEN 325 MG PO TABS
650.0000 mg | ORAL_TABLET | Freq: Once | ORAL | Status: AC
  Administered 2024-03-26: 650 mg via ORAL
  Filled 2024-03-26: qty 2

## 2024-03-26 MED ORDER — BORTEZOMIB CHEMO SQ INJECTION 3.5 MG (2.5MG/ML)
0.7000 mg/m2 | Freq: Once | INTRAMUSCULAR | Status: AC
  Administered 2024-03-26: 1.25 mg via SUBCUTANEOUS
  Filled 2024-03-26: qty 0.5

## 2024-03-26 MED ORDER — DARATUMUMAB-HYALURONIDASE-FIHJ 1800-30000 MG-UT/15ML ~~LOC~~ SOLN
1800.0000 mg | Freq: Once | SUBCUTANEOUS | Status: AC
  Administered 2024-03-26: 1800 mg via SUBCUTANEOUS
  Filled 2024-03-26: qty 15

## 2024-03-26 MED ORDER — CETIRIZINE HCL 10 MG PO TABS
10.0000 mg | ORAL_TABLET | Freq: Once | ORAL | Status: AC
  Administered 2024-03-26: 10 mg via ORAL
  Filled 2024-03-26: qty 1

## 2024-03-26 NOTE — Patient Instructions (Signed)
 [facility] [place] - A DEPT OF [facility]  Discharge Instructions: Thank you for choosing [facility] to provide your oncology and hematology care.  If you have a lab appointment with the Cancer Center - please note that after April 8th, 2024, all labs will be drawn in the cancer center.  You do not have to check in or register with the main entrance as you have in the past but will complete your check-in in the cancer center.  Wear comfortable clothing and clothing appropriate for easy access to any Portacath or PICC line.   We strive to give you quality time with your provider. You may need to reschedule your appointment if you arrive late (15 or more minutes).  Arriving late affects you and other patients whose appointments are after yours.  Also, if you miss three or more appointments without notifying the office, you may be dismissed from the clinic at the provider's discretion.      For prescription refill requests, have your pharmacy contact our office and allow 72 hours for refills to be completed.    Today you received the following chemotherapy and/or immunotherapy agents Darzalex  Faspro/Velcade /Xgeva        To help prevent nausea and vomiting after your treatment, we encourage you to take your nausea medication as directed.  BELOW ARE SYMPTOMS THAT SHOULD BE REPORTED IMMEDIATELY: *FEVER GREATER THAN 100.4 F (38 C) OR HIGHER *CHILLS OR SWEATING *NAUSEA AND VOMITING THAT IS NOT CONTROLLED WITH YOUR NAUSEA MEDICATION *UNUSUAL SHORTNESS OF BREATH *UNUSUAL BRUISING OR BLEEDING *URINARY PROBLEMS (pain or burning when urinating, or frequent urination) *BOWEL PROBLEMS (unusual diarrhea, constipation, pain near the anus) TENDERNESS IN MOUTH AND THROAT WITH OR WITHOUT PRESENCE OF ULCERS (sore throat, sores in mouth, or a toothache) UNUSUAL RASH, SWELLING OR PAIN  UNUSUAL VAGINAL DISCHARGE OR ITCHING   Items with * indicate a potential emergency and  should be followed up as soon as possible or go to the Emergency Department if any problems should occur.  Please show the CHEMOTHERAPY ALERT CARD or IMMUNOTHERAPY ALERT CARD at check-in to the Emergency Department and triage nurse.  Should you have questions after your visit or need to cancel or reschedule your appointment, please contact [facility] [place] - A DEPT OF [facility] [phone]  and follow the prompts.  Office hours are 8:00 a.m. to 4:30 p.m. Monday - Friday. Please note that voicemails left after 4:00 p.m. may not be returned until the following business day.  We are closed weekends and major holidays. You have access to a nurse at all times for urgent questions. Please call the main number to the clinic [phone] and follow the prompts.  For any non-urgent questions, you may also contact your provider using MyChart. We now offer e-Visits for anyone 8 and older to request care online for non-urgent symptoms. For details visit [email or web address].   Also download the MyChart app! Go to the app store, search MyChart, open the app, select [place], and log in with your MyChart username and password.

## 2024-03-26 NOTE — Progress Notes (Signed)
 Patient presents today for chemotherapy Darzalex  Faspro/Velcade  and Xgeva   injections.  Patient is in satisfactory condition with no new complaints voiced.  Vital signs are stable.  Labs reviewed and all labs are within treatment parameters. Patient will receive 40 mEq potassium chloride  p.o x 1 dose.  We will proceed with treatment per MD orders.    Patient denies any tooth or jaw pain and no recent or future major dental appointments. Patient reports taking Calcium/Vit D supplements as directed.   Treatment given today per MD orders. Tolerated infusion without adverse affects. Vital signs stable. No complaints at this time. Discharged from clinic via wheelchair in stable condition. Alert and oriented x 3. F/U with [facility] as scheduled.

## 2024-03-27 ENCOUNTER — Ambulatory Visit: Attending: Internal Medicine | Admitting: *Deleted

## 2024-03-27 DIAGNOSIS — I480 Paroxysmal atrial fibrillation: Secondary | ICD-10-CM | POA: Diagnosis not present

## 2024-03-27 DIAGNOSIS — Z86711 Personal history of pulmonary embolism: Secondary | ICD-10-CM

## 2024-03-27 DIAGNOSIS — Z5181 Encounter for therapeutic drug level monitoring: Secondary | ICD-10-CM | POA: Diagnosis not present

## 2024-03-27 LAB — POCT INR: INR: 3.3 — AB (ref 2.0–3.0)

## 2024-03-27 LAB — KAPPA/LAMBDA LIGHT CHAINS
Kappa free light chain: 20 mg/L — ABNORMAL HIGH (ref 3.3–19.4)
Kappa, lambda light chain ratio: 2.6 — ABNORMAL HIGH (ref 0.26–1.65)
Lambda free light chains: 7.7 mg/L (ref 5.7–26.3)

## 2024-03-27 NOTE — Progress Notes (Signed)
 INR 3.3; Please see anticoagulation encounter

## 2024-03-27 NOTE — Patient Instructions (Signed)
 Previously checked in [place].  Has been taking warfarin 6mg  daily. Decrease warfarin to 1 tablet daily except 1/2 tablet on Wednesdays Recheck in 4 weeks

## 2024-03-28 LAB — PROTEIN ELECTROPHORESIS, SERUM
A/G Ratio: 0.9 (ref 0.7–1.7)
Albumin ELP: 3.4 g/dL (ref 2.9–4.4)
Alpha-1-Globulin: 0.3 g/dL (ref 0.0–0.4)
Alpha-2-Globulin: 0.9 g/dL (ref 0.4–1.0)
Beta Globulin: 1 g/dL (ref 0.7–1.3)
Gamma Globulin: 1.8 g/dL (ref 0.4–1.8)
Globulin, Total: 4 g/dL — ABNORMAL HIGH (ref 2.2–3.9)
M-Spike, %: 1.1 g/dL — ABNORMAL HIGH
Total Protein ELP: 7.4 g/dL (ref 6.0–8.5)

## 2024-04-02 ENCOUNTER — Ambulatory Visit
Attending: Student in an Organized Health Care Education/Training Program | Admitting: Student in an Organized Health Care Education/Training Program

## 2024-04-02 ENCOUNTER — Encounter: Payer: Self-pay | Admitting: Student in an Organized Health Care Education/Training Program

## 2024-04-02 ENCOUNTER — Ambulatory Visit: Admitting: Student in an Organized Health Care Education/Training Program

## 2024-04-02 VITALS — BP 130/82 | HR 67 | Ht 63.0 in | Wt 133.4 lb

## 2024-04-02 DIAGNOSIS — I442 Atrioventricular block, complete: Secondary | ICD-10-CM

## 2024-04-02 LAB — CUP PACEART INCLINIC DEVICE CHECK
Brady Statistic RA Percent Paced: 53.1 %
Brady Statistic RV Percent Paced: 99.5 %
Date Time Interrogation Session: 20251111110556
Implantable Lead Connection Status: 753985
Implantable Lead Connection Status: 753985
Implantable Lead Implant Date: 20250825
Implantable Lead Implant Date: 20250825
Implantable Lead Location: 753859
Implantable Lead Location: 753860
Implantable Lead Model: 5076
Implantable Lead Model: 5076
Implantable Pulse Generator Implant Date: 20250825

## 2024-04-02 NOTE — Patient Instructions (Signed)
 Medication Instructions:  Your physician recommends that you continue on your current medications as directed. Please refer to the Current Medication list given to you today.  *If you need a refill on your cardiac medications before your next appointment, please call your pharmacy*  Lab Work: NONE   If you have labs (blood work) drawn today and your tests are completely normal, you will receive your results only by: MyChart Message (if you have MyChart) OR A paper copy in the mail If you have any lab test that is abnormal or we need to change your treatment, we will call you to review the results.  Testing/Procedures: NONE   Follow-Up: At [facility], you and your health needs are our priority.  As part of our continuing mission to provide you with exceptional heart care, our providers are all part of one team.  This team includes your primary Cardiologist (physician) and Advanced Practice Providers or APPs (Physician Assistants and Nurse Practitioners) who all work together to provide you with the care you need, when you need it.  Your next appointment:   6 month(s)  Provider:   Donnice Primus, MD    We recommend signing up for the patient portal called MyChart.  Sign up information is provided on this After Visit Summary.  MyChart is used to connect with patients for Virtual Visits (Telemedicine).  Patients are able to view lab/test results, encounter notes, upcoming appointments, etc.  Non-urgent messages can be sent to your provider as well.   To learn more about what you can do with MyChart, go to [email or web address].   Other Instructions Thank you for choosing [facility]!

## 2024-04-02 NOTE — Progress Notes (Signed)
 Cardiology Office Note   Date:  04/02/2024  ID:  Courtney Rogers, Courtney Rogers 08/22/1950, MRN [id number] PCP: Edman Meade PEDLAR, FNP  [facility] Providers Cardiologist:  Diannah SHAUNNA Maywood, MD Electrophysiologist:  Donnice DELENA Primus, MD   History of Present Illness Courtney Rogers is a 73 y.o. female with SSS, paroxysmal AF, CHB s/p MDT DC PPM, prior PE (2015), non-obstructive CAD who presents for device follow-up.  She was seen by Dr. Pierrette on 01/01/2024 for device management with her PPM at [facility] and underwent successful generator replacement on 01/15/2024.  Today she presents to clinic with her daughter and has no complaints.  She denies any shortness of breath, dyspnea on exertion, or palpitations.  Discussed the use of AI scribe software for clinical note transcription with the patient, who gave verbal consent to proceed.  History of Present Illness Courtney Rogers is a 73 year old female who presents for a cardiovascular follow-up after a pacemaker battery change. She is accompanied by her family. She was referred by Dr. Hagelin for follow-up after a pacemaker battery change.  She recently underwent a pacemaker battery change in August, with no complications reported. She reports that she uses her pacemaker consistently.  She has been on warfarin since 2015 for anticoagulation. She experiences frequent adjustments to her warfarin dosage in the past, which has been challenging. Her current regimen is 6 mg nightly, with a reduced dose of 3 mg dose on Wednesdays. No significant issues with warfarin recently, although the dosage adjustments have been difficult to manage.  She lives with her sister and father, and also has a dog.  ROS: none  Studies Reviewed  ECG review 02/13/24: APVP 60, PR 142, QRS 222, QT/c 548/548  TTE Result date: 07/29/21  1. No strain imaging done.   2. Distal septal and inferior apical hypokinesis . Left ventricular  ejection fraction, by  estimation, is 50 to 55%. The left ventricle has low  normal function. The left ventricle demonstrates regional wall motion  abnormalities (see scoring  diagram/findings for description). The left ventricular internal cavity  size was mildly dilated. Left ventricular diastolic parameters were  normal.   3. PPM wire in RA/RV. Right ventricular systolic function is normal. The  right ventricular size is normal. There is mildly elevated pulmonary  artery systolic pressure.   4. Left atrial size was mildly dilated.   5. The mitral valve is normal in structure. Trivial mitral valve  regurgitation. No evidence of mitral stenosis.   6. Small gradient across AV no AS . The aortic valve is tricuspid. There  is mild calcification of the aortic valve. Aortic valve regurgitation is  mild. Aortic valve sclerosis is present, with no evidence of aortic valve  stenosis.   7. The inferior vena cava is normal in size with greater than 50%  respiratory variability, suggesting right atrial pressure of 3 mmHg.   Risk Assessment/Calculations  CHA2DS2-VASc Score = 4  This indicates a 4.8% annual risk of stroke. The patient's score is based upon: CHF History: 0 HTN History: 1 Diabetes History: 0 Stroke History: 0 Vascular Disease History: 0 Age Score: 2 Gender Score: 1  Physical Exam VS:  BP 130/82   Pulse 67   Ht 5' 3 (1.6 m)   Wt 133 lb 6.4 oz (60.5 kg)   SpO2 100%   BMI 23.63 kg/m       Wt Readings from Last 3 Encounters:  04/02/24 133 lb 6.4 oz (60.5 kg)  03/26/24  133 lb 6.4 oz (60.5 kg)  03/12/24 133 lb (60.3 kg)    GEN: Well nourished, well developed in no acute distress NECK: No JVD; No carotid bruits CARDIAC: RRR, no murmurs, rubs, gallops RESPIRATORY:  Clear to auscultation without rales, wheezing or rhonchi  ABDOMEN: Soft, non-tender, non-distended EXTREMITIES:  No edema; No deformity  SKIN: LEFT infraclavicular incision well healed, no erythema or erosion   Device  information PPM: MDT Advisa [id number], SN [id number], DOI 04/07/14 RA: MDT 4923, SN [id number], DOI 04/07/14 RV: MDT 4923, SN [id number], DOI 04/07/14  ASSESSMENT AND PLAN Courtney Rogers is a 73 y.o. female with SSS, paroxysmal AF, CHB s/p MDT DC PPM, prior PE (2015), non-obstructive CAD who presents for device follow-up.  CHB s/p MDT DC PPM (RV apical) SSS Paroxysmal AF Prior PE on warfarin  No issues today with device function all within normal limits. Stable lead parameters. One episode of NSVT.  We reviewed her OAC with warfarin which she initially had some issues with dosing however seems to have had less issue more recently.  INR's have been somewhat variable between 1.8-3.4.  We discussed transitioning to Eliquis however she would like to remain on warfarin for now.  No changes to device programming.  She has not had any AT/AF since generator replacement.  She has not had an EF assessment since 2023 and is VP (QRS 222) but no HF sx currently.      Dispo: RTC 6 months   A total of 25 minutes was spent preparing for the patient, reviewing history, performing exam, document encounter, coordinating care and counseling the patient. 12 minutes was spent with direct patient care.   Signed, Donnice DELENA Primus, MD

## 2024-04-03 ENCOUNTER — Other Ambulatory Visit: Payer: Self-pay

## 2024-04-05 ENCOUNTER — Ambulatory Visit (HOSPITAL_COMMUNITY)
Admission: RE | Admit: 2024-04-05 | Discharge: 2024-04-05 | Disposition: A | Discharge status: Home / Self Care | Source: Ambulatory Visit | Attending: Urology | Admitting: Urology

## 2024-04-05 DIAGNOSIS — N200 Calculus of kidney: Secondary | ICD-10-CM | POA: Insufficient documentation

## 2024-04-05 DIAGNOSIS — N281 Cyst of kidney, acquired: Secondary | ICD-10-CM | POA: Diagnosis present

## 2024-04-07 ENCOUNTER — Ambulatory Visit: Payer: Self-pay | Admitting: Student in an Organized Health Care Education/Training Program

## 2024-04-10 ENCOUNTER — Inpatient Hospital Stay

## 2024-04-10 ENCOUNTER — Inpatient Hospital Stay (HOSPITAL_BASED_OUTPATIENT_CLINIC_OR_DEPARTMENT_OTHER): Admitting: Oncology

## 2024-04-10 ENCOUNTER — Encounter: Payer: Self-pay | Admitting: Oncology

## 2024-04-10 VITALS — BP 105/79 | HR 62 | Temp 98.0°F | Resp 18 | Wt 136.4 lb

## 2024-04-10 DIAGNOSIS — I2699 Other pulmonary embolism without acute cor pulmonale: Secondary | ICD-10-CM | POA: Diagnosis not present

## 2024-04-10 DIAGNOSIS — C9000 Multiple myeloma not having achieved remission: Secondary | ICD-10-CM

## 2024-04-10 DIAGNOSIS — E876 Hypokalemia: Secondary | ICD-10-CM

## 2024-04-10 DIAGNOSIS — G6289 Other specified polyneuropathies: Secondary | ICD-10-CM | POA: Diagnosis not present

## 2024-04-10 DIAGNOSIS — Z5112 Encounter for antineoplastic immunotherapy: Secondary | ICD-10-CM | POA: Diagnosis not present

## 2024-04-10 LAB — COMPREHENSIVE METABOLIC PANEL WITH GFR
ALT: 22 U/L (ref 0–44)
AST: 25 U/L (ref 15–41)
Albumin: 4 g/dL (ref 3.5–5.0)
Alkaline Phosphatase: 70 U/L (ref 38–126)
Anion gap: 10 (ref 5–15)
BUN: 17 mg/dL (ref 8–23)
CO2: 31 mmol/L (ref 22–32)
Calcium: 8.8 mg/dL — ABNORMAL LOW (ref 8.9–10.3)
Chloride: 101 mmol/L (ref 98–111)
Creatinine, Ser: 0.81 mg/dL (ref 0.44–1.00)
GFR, Estimated: >60 mL/min (ref >60)
Glucose, Bld: 68 mg/dL — ABNORMAL LOW (ref 70–99)
Potassium: 2.9 mmol/L — ABNORMAL LOW (ref 3.5–5.1)
Sodium: 141 mmol/L (ref 135–145)
Total Bilirubin: 0.3 mg/dL (ref 0.0–1.2)
Total Protein: 7.5 g/dL (ref 6.5–8.1)

## 2024-04-10 LAB — MAGNESIUM: Magnesium: 2.2 mg/dL (ref 1.7–2.4)

## 2024-04-10 LAB — CBC WITH DIFFERENTIAL/PLATELET
Abs Immature Granulocytes: 0.01 K/uL (ref 0.00–0.07)
Basophils Absolute: 0.1 K/uL (ref 0.0–0.1)
Basophils Relative: 2 %
Eosinophils Absolute: 0.3 K/uL (ref 0.0–0.5)
Eosinophils Relative: 8 %
HCT: 36.6 % (ref 36.0–46.0)
Hemoglobin: 12.4 g/dL (ref 12.0–15.0)
Immature Granulocytes: 0 %
Lymphocytes Relative: 20 %
Lymphs Abs: 0.8 K/uL (ref 0.7–4.0)
MCH: 34.2 pg — ABNORMAL HIGH (ref 26.0–34.0)
MCHC: 33.9 g/dL (ref 30.0–36.0)
MCV: 100.8 fL — ABNORMAL HIGH (ref 80.0–100.0)
Monocytes Absolute: 0.2 K/uL (ref 0.1–1.0)
Monocytes Relative: 6 %
Neutro Abs: 2.4 K/uL (ref 1.7–7.7)
Neutrophils Relative %: 64 %
Platelets: 135 K/uL — ABNORMAL LOW (ref 150–400)
RBC: 3.63 MIL/uL — ABNORMAL LOW (ref 3.87–5.11)
RDW: 16.4 % — ABNORMAL HIGH (ref 11.5–15.5)
WBC: 3.8 K/uL — ABNORMAL LOW (ref 4.0–10.5)
nRBC: 0 % (ref 0.0–0.2)

## 2024-04-10 MED ORDER — ACETAMINOPHEN 325 MG PO TABS
650.0000 mg | ORAL_TABLET | Freq: Once | ORAL | Status: AC
  Administered 2024-04-10: 650 mg via ORAL
  Filled 2024-04-10: qty 2

## 2024-04-10 MED ORDER — CALCIUM CARB-CHOLECALCIFEROL 600-10 MG-MCG PO TABS: 1.0000 | ORAL_TABLET | Freq: Two times a day (BID) | ORAL | 6 refills | Status: AC

## 2024-04-10 MED ORDER — POTASSIUM CHLORIDE CRYS ER 20 MEQ PO TBCR
40.0000 meq | EXTENDED_RELEASE_TABLET | Freq: Once | ORAL | Status: AC
  Administered 2024-04-10: 40 meq via ORAL
  Filled 2024-04-10: qty 2

## 2024-04-10 MED ORDER — PROCHLORPERAZINE MALEATE 10 MG PO TABS
10.0000 mg | ORAL_TABLET | Freq: Once | ORAL | Status: AC
  Administered 2024-04-10: 10 mg via ORAL
  Filled 2024-04-10: qty 1

## 2024-04-10 MED ORDER — CETIRIZINE HCL 10 MG PO TABS
10.0000 mg | ORAL_TABLET | Freq: Once | ORAL | Status: AC
  Administered 2024-04-10: 10 mg via ORAL
  Filled 2024-04-10: qty 1

## 2024-04-10 MED ORDER — BORTEZOMIB CHEMO SQ INJECTION 3.5 MG (2.5MG/ML)
0.7000 mg/m2 | Freq: Once | INTRAMUSCULAR | Status: AC
  Administered 2024-04-10: 1.25 mg via SUBCUTANEOUS
  Filled 2024-04-10: qty 0.5

## 2024-04-10 MED ORDER — DEXAMETHASONE 4 MG PO TABS
20.0000 mg | ORAL_TABLET | Freq: Once | ORAL | Status: AC
  Administered 2024-04-10: 20 mg via ORAL
  Filled 2024-04-10: qty 5

## 2024-04-10 MED ORDER — DARATUMUMAB-HYALURONIDASE-FIHJ 1800-30000 MG-UT/15ML ~~LOC~~ SOLN
1800.0000 mg | Freq: Once | SUBCUTANEOUS | Status: AC
  Administered 2024-04-10: 1800 mg via SUBCUTANEOUS
  Filled 2024-04-10: qty 15

## 2024-04-10 NOTE — Progress Notes (Signed)
 Labs reviewed today at office visit. Ok to treat per MD. Will give PO K+ for low K+ today.   Treatment given per orders. Patient tolerated it well without problems. Vitals stable and discharged home from clinic via wheelchair. Follow up as scheduled.

## 2024-04-10 NOTE — Patient Instructions (Signed)

## 2024-04-10 NOTE — Patient Instructions (Signed)
 [facility] [place] - A DEPT OF [facility]  Discharge Instructions: Thank you for choosing [facility] to provide your oncology and hematology care.  If you have a lab appointment with the Cancer Center - please note that after April 8th, 2024, all labs will be drawn in the cancer center.  You do not have to check in or register with the main entrance as you have in the past but will complete your check-in in the cancer center.  Wear comfortable clothing and clothing appropriate for easy access to any Portacath or PICC line.   We strive to give you quality time with your provider. You may need to reschedule your appointment if you arrive late (15 or more minutes).  Arriving late affects you and other patients whose appointments are after yours.  Also, if you miss three or more appointments without notifying the office, you may be dismissed from the clinic at the provider's discretion.      For prescription refill requests, have your pharmacy contact our office and allow 72 hours for refills to be completed.    Today you received the following chemotherapy and/or immunotherapy agents velcade , darzalex  faspro   To help prevent nausea and vomiting after your treatment, we encourage you to take your nausea medication as directed.  BELOW ARE SYMPTOMS THAT SHOULD BE REPORTED IMMEDIATELY: *FEVER GREATER THAN 100.4 F (38 C) OR HIGHER *CHILLS OR SWEATING *NAUSEA AND VOMITING THAT IS NOT CONTROLLED WITH YOUR NAUSEA MEDICATION *UNUSUAL SHORTNESS OF BREATH *UNUSUAL BRUISING OR BLEEDING *URINARY PROBLEMS (pain or burning when urinating, or frequent urination) *BOWEL PROBLEMS (unusual diarrhea, constipation, pain near the anus) TENDERNESS IN MOUTH AND THROAT WITH OR WITHOUT PRESENCE OF ULCERS (sore throat, sores in mouth, or a toothache) UNUSUAL RASH, SWELLING OR PAIN  UNUSUAL VAGINAL DISCHARGE OR ITCHING   Items with * indicate a potential emergency and should be  followed up as soon as possible or go to the Emergency Department if any problems should occur.  Please show the CHEMOTHERAPY ALERT CARD or IMMUNOTHERAPY ALERT CARD at check-in to the Emergency Department and triage nurse.  Should you have questions after your visit or need to cancel or reschedule your appointment, please contact [facility] [place] - A DEPT OF [facility] [phone]  and follow the prompts.  Office hours are 8:00 a.m. to 4:30 p.m. Monday - Friday. Please note that voicemails left after 4:00 p.m. may not be returned until the following business day.  We are closed weekends and major holidays. You have access to a nurse at all times for urgent questions. Please call the main number to the clinic [phone] and follow the prompts.  For any non-urgent questions, you may also contact your provider using MyChart. We now offer e-Visits for anyone 102 and older to request care online for non-urgent symptoms. For details visit [email or web address].   Also download the MyChart app! Go to the app store, search MyChart, open the app, select [facility], and log in with your MyChart username and password.

## 2024-04-10 NOTE — Progress Notes (Signed)
 Patient Care Team: Bacchus, Meade PEDLAR, FNP as PCP - General (Family Medicine) Stacia Diannah SQUIBB, MD as PCP - Cardiology (Cardiology) Almetta Donnice LABOR, MD as PCP - Electrophysiology (Clinical Cardiac Electrophysiology) Wilson, Diane G, RN as Oncology Nurse Navigator (Oncology) Cindie Carlin POUR, DO as Consulting Physician (Internal Medicine) Frances Ozell RAMAN, LCSW as [facility] Care Management (Licensed Clinical Social Worker) Davonna Siad, MD as Consulting Physician (Oncology)  Clinic Day:  04/10/2024  Referring physician: Edman Meade PEDLAR, FNP   CHIEF COMPLAINT:  CC: Stage II IgA kappa multiple myeloma, 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 S/p induction with Dara VRD.  Patient refused transplantation as per documentation Maintenance on Velcade  and Revlimid  with recent worsening of multiple myeloma labs and was restarted on daratumumab  Currently on daratumumab , Velcade  and Revlimid    - Reviewed labs today.  CMP: Potassium: 2.9, creatinine: Normal, LFTs: Stable, calcium : 8.8, CBC: WBC: 3.8, hemoglobin: 12 0.4, platelets: 135, ANC: 2400. -Kappa free light chain: 20, lambda free light chain: 7.7, ratio: 2.60, SPEP: M spike: 1.1 but has an additional M spike at a concentration of 0.3(likely from daratumumab ) - Continue daratumumab , Velcade  and Revlimid  as scheduled.  -Physical exam stable today.  Can proceed with chemotherapy today. - Continue acyclovir  twice daily - Will draw multiple myeloma labs with every other treatment   Return to clinic in 2 months, prior to treatment  Hypokalemia Persistent despite supplementation. Possible contributing factors include diarrhea and increased urination.   - Administered  potassium supplementation today. - Ensure potassium is taken twice daily as prescribed.  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 Sensation of dirt under fingernails.  No numbness tingling at this time.   - Continue Lyrica /pregabalin  50 mg twice daily. - Will change Velcade  dosing to every 4 weeks  Pulmonary embolism Patient has a history of PE and is on Coumadin   - Considering patient is on Revlimid , high risk of thrombosis, will need indefinite anticoagulation - Continue Coumadin as prescribed  Edema of lower extremities Mild fluid retention in the legs. Not on furosemide at this time  Chest wall pain Patient reported right lower rib pain at last visit Recent PET scan did not show any lytic lesions in that area X-ray did not show any rib fractures Resolved at this time.    The patient understands the plans discussed today and is in agreement with them.  She knows to contact our office if she develops concerns prior to her next appointment.  The total time spent in the appointment was 35 minutes for the encounter with patient, including review of chart and various tests results, discussions about plan of care and coordination of care plan    Siad Davonna, MD  [place] [facility] [place] - A DEPT OF [facility] [address]  [place] [address] Dept: [phone] Dept Fax: [phone]   No orders of the defined types were placed in this encounter.    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 IFE: biclonal IgA protein with kappa specificity. NO CRAB criteria -06/25/2019: FLC ratio  4.80. LDH 207. Beta-2  microglobulin 2.8. M-spike 2.5.IFE:  No CRAB criteria -07/09/2019: Bone Marrow Biopsy.  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  -07/09/2019: Initial PET: No findings of active malignancy  -08/20/2019: SPEP:M spike: 2.3. Hb:10.1, Normal Creatinine and calcium  -02/07/2020: DXA scan with T-score -0.5.  -06/09/2021: Hb:10.8, Cr:1.05, Ca:10.2, LDH:135, M spike:3.5. FLC ratio: 17.43 (Worsening myeloma panel) -07/12/2021: Bone marrow Biopsy.  -Pathology: Hypercellular bone marrow with kappa light chain restricted plasma cell neoplasm representing 30% of all cells.  -FISH: Gain of 1 q. (high risk), T p53 deletion (high risk), monosomy 13 (standard risk)  -Cytogenetics: Complex karyotype.  -06/24/2021: PET: Progressive myeloma, with several scattered hypermetabolic skeletal lesions including the bony pelvis, left mid humerus, bilateral mid femurs, right superior pubic ramus, and left calvarium. -07/29/2021: 2D echo:  EF 50-55%. There is distal septal and inferior apical hypokinesis.  -08/10/2021-11/30/2021: 6 cycles of Dara VRD -09/09/2021: Patient met with Dr. Darwyn at [facility] for bone marrow transplant.  As per documentation by her previous oncologist on 10/19/2021, patient refused pursuing transplant option. -12/07/2021: Myeloma panel: M-spike 0.2g and normal FLC ratio. Negative DIRA test. -12/20/2021 -Current: Maintenance Velcade  0.7 mg/m every 2 weeks and Revlimid  3 weeks on/1 week off -08/10/2023: Revlimid  dose increased to 10 mg 3 weeks on/1 week off due to increased M spike of 0.4 (missed Revlimid  in 10/2023) -09/2023-12/2023: Worsening MM labs- M spike and FLC -01/03/2024: Daratumumab  added to regimen due worsening myeloma labs -01/04/2024: PET: No evidence of active myeloma. No evidence of plasmacytoma.  -01/12/2024: SPEP: M spike: 0.8, kappa free light chain: 14.4, lambda free light chain: 10.5, ratio: 1.37.  IFE: Biclonal IgA protein with kappa specificity.   Current Treatment:  Daratumumab  (monthly) +  Velcade (0.7mg /m2 every 2 weeks) + Revlimid  (10mg  3 weeks on/one week off)  INTERVAL HISTORY:   Discussed the use of AI scribe software for clinical note transcription with the patient, who gave verbal consent to proceed.  History of Present Illness Courtney Rogers is a 73 year old female who presents for follow up for multiple myeloma and chemotherapy today.  She experiences a persistent sensation of having 'dirt under her fingernails' without any associated numbness or tingling. She is uncertain about the cause and seeks further understanding of this sensation.  She has been experiencing low potassium levels despite taking potassium supplements twice daily and receiving additional potassium from her blister pack medications. She has diarrhea once a day. She is not currently under the care of a kidney specialist and is unsure if she is taking calcium  pills, although they are indicated in her pill pack. Her medications are provided in blister packs prepared monthly by the pharmacy.  No numbness or tingling. Diarrhea occurs once daily.    I have reviewed the past medical history, past surgical history, social history and family history with the patient and they are unchanged from previous note.  ALLERGIES:  is allergic to amoxicillin-pot clavulanate, clarithromycin, erythromycin, lisinopril, and amoxicillin.  MEDICATIONS:  Current Outpatient Medications  Medication Sig Dispense Refill   acetaminophen  (TYLENOL ) 500 MG tablet Take 1,000 mg by mouth 2 (two) times daily as needed for moderate pain or headache.     acyclovir  (ZOVIRAX ) 400 MG tablet TAKE ONE TABLET BY MOUTH TWICE DAILY 60 tablet 4   amLODipine  (NORVASC ) 10 MG tablet Take 10 mg by mouth.     B Complex-Folic Acid  (B  COMPLEX VITAMINS, W/ FA,) CAPS Take 1 capsule by mouth daily.     busPIRone  (BUSPAR ) 5 MG tablet Take 5 mg by mouth daily.     Calcium  Carb-Cholecalciferol  600-10 MG-MCG TABS Take 1 tablet by mouth 2 (two) times  daily.     Calcium  Carbonate-Vitamin D  (CALCIUM  600+D PO) Take 1 tablet by mouth 2 (two) times daily.     chlorthalidone (HYGROTON) 25 MG tablet Take 12.5 mg by mouth every morning.     citalopram  (CELEXA ) 20 MG tablet TAKE ONE TABLET BY MOUTH EVERY DAY 30 tablet 3   cyanocobalamin  (VITAMIN B12) 1000 MCG tablet Take 1,000 mcg by mouth daily.     diclofenac  Sodium (VOLTAREN ) 1 % GEL Apply 4 g topically 4 (four) times daily. 50 g 0   dicyclomine  (BENTYL ) 10 MG capsule TAKE ONE CAPSULE BY MOUTH THREE TIMES A DAY BEFORE meals 90 capsule 2   doxycycline  (VIBRA -TABS) 100 MG tablet      FEROSUL 325 (65 Fe) MG tablet Take 325 mg by mouth daily.     fluticasone  (FLONASE ) 50 MCG/ACT nasal spray USE 2 SPRAYS IN EACH NOSTRIL ONCE DAILY 16 g 0   folic acid  (FOLVITE ) 1 MG tablet Take 1 tablet (1 mg total) by mouth daily. 30 tablet 2   GNP VITAMIN C 500 MG tablet Take 500 mg by mouth daily.     lenalidomide  (REVLIMID ) 10 MG capsule Take 1 capsule (10 mg total) by mouth daily. 21 days on, 7 days off every 28 days 21 capsule 0   levocetirizine (XYZAL ) 5 MG tablet TAKE ONE TABLET BY MOUTH IN THE EVENING 30 tablet 3   lidocaine  (LIDODERM ) 5 % Place 1 patch onto the skin daily. Remove & Discard patch within 12 hours or as directed by MD 30 patch 0   magnesium  oxide (MAG-OX) 400 (240 Mg) MG tablet TAKE ONE TABLET BY MOUTH TWICE DAILY 60 tablet 4   metoprolol  tartrate (LOPRESSOR ) 50 MG tablet Take 1 tablet (50 mg total) by mouth 2 (two) times daily. 180 tablet 1   montelukast  (SINGULAIR ) 10 MG tablet TAKE ONE TABLET BY MOUTH AT BEDTIME 30 tablet 3   MYRBETRIQ  50 MG TB24 tablet TAKE ONE TABLET BY MOUTH ONCE DAILY 30 tablet 3   NICODERM CQ 14 MG/24HR patch 14 mg daily.     nitroGLYCERIN (NITROSTAT) 0.4 MG SL tablet Place 0.4 mg under the tongue every 5 (five) minutes as needed for chest pain.     pantoprazole  (PROTONIX ) 40 MG tablet TAKE ONE TABLET BY MOUTH TWICE DAILY 90 tablet 3   potassium chloride  SA (KLOR-CON  M)  20 MEQ tablet Take 1 tablet (20 mEq total) by mouth 2 (two) times daily. 60 tablet 3   pregabalin  (LYRICA ) 50 MG capsule TAKE ONE CAPSULE BY MOUTH TWICE DAILY 60 capsule 4   primidone (MYSOLINE) 50 MG tablet TAKE ONE TABLET BY MOUTH TWICE DAILY 60 tablet 5   Probiotic Product (GNP PROBIOTIC COLON SUPPORT) CAPS Take 1 capsule by mouth daily.     sertraline  (ZOLOFT ) 50 MG tablet TAKE ONE TABLET BY MOUTH ONCE DAILY 30 tablet 3   simvastatin  (ZOCOR ) 20 MG tablet Take 20 mg by mouth at bedtime.     topiramate (TOPAMAX) 25 MG tablet Take 25 mg by mouth 2 (two) times daily.     traMADol  (ULTRAM ) 50 MG tablet Take 1 tablet (50 mg total) by mouth every 6 (six) hours as needed. 20 tablet 0   warfarin (COUMADIN) 3 MG tablet  Take 3 mg by mouth daily.     warfarin (COUMADIN) 6 MG tablet Take 6 mg by mouth at bedtime.     No current facility-administered medications for this visit.    REVIEW OF SYSTEMS:   Constitutional: Denies fevers, chills or abnormal weight loss Eyes: Denies blurriness of vision Ears, nose, mouth, throat, and face: Denies mucositis or sore throat Respiratory: Denies cough, dyspnea or wheezes Cardiovascular: Denies palpitation, chest discomfort or lower extremity swelling Gastrointestinal:  Denies nausea, heartburn or change in bowel habits Skin: Denies abnormal skin rashes Lymphatics: Denies new lymphadenopathy or easy bruising Neurological:Denies numbness, tingling or new weaknesses Behavioral/Psych: Mood is stable, no new changes  All other systems were reviewed with the patient and are negative.   VITALS:  There were no vitals taken for this visit.  Wt Readings from Last 3 Encounters:  04/02/24 133 lb 6.4 oz (60.5 kg)  03/26/24 133 lb 6.4 oz (60.5 kg)  03/12/24 133 lb (60.3 kg)    There is no height or weight on file to calculate BMI.  Performance status (ECOG): 2 - Symptomatic, <50% confined to bed  PHYSICAL EXAM:   GENERAL:alert, no distress and comfortable, in a  wheelchair SKIN: skin color, texture, turgor are normal, no rashes or significant lesions EYES: normal, Conjunctiva are pink and non-injected, sclera clear LYMPH:  no palpable lymphadenopathy in the cervical, axillary or inguinal LUNGS: clear to auscultation and percussion with normal breathing effort HEART: regular rate & rhythm and no murmurs and no lower extremity edema ABDOMEN:abdomen soft, non-tender and normal bowel sounds Musculoskeletal: Tenderness in the right lower rib region NEURO: alert & oriented x 3 with fluent speech  LABORATORY DATA:  I have reviewed the data as listed     Component Value Date/Time   NA 139 03/26/2024 1111   NA 141 03/21/2023 1115   K 3.2 (L) 03/26/2024 1111   CL 101 03/26/2024 1111   CO2 26 03/26/2024 1111   GLUCOSE 82 03/26/2024 1111   BUN 16 03/26/2024 1111   BUN 20 03/21/2023 1115   CREATININE 0.85 03/26/2024 1111   CREATININE 0.74 09/24/2013 1119   CALCIUM  8.6 (L) 03/26/2024 1111   PROT 7.7 03/26/2024 1111   PROT 6.7 02/24/2023 1121   ALBUMIN 4.0 03/26/2024 1111   ALBUMIN 4.4 02/24/2023 1121   AST 27 03/26/2024 1111   ALT 24 03/26/2024 1111   ALKPHOS 58 03/26/2024 1111   BILITOT 0.4 03/26/2024 1111   BILITOT 0.4 02/24/2023 1121   GFRNONAA >60 03/26/2024 1111   GFRNONAA 86 09/24/2013 1119   GFRAA 80 07/15/2020 1113   GFRAA >89 09/24/2013 1119    Lab Results  Component Value Date   WBC 3.2 (L) 03/26/2024   NEUTROABS 1.6 (L) 03/26/2024   HGB 12.0 03/26/2024   HCT 35.8 (L) 03/26/2024   MCV 100.6 (H) 03/26/2024   PLT 150 03/26/2024     Latest Reference Range & Units 03/26/24 11:11  Total Protein ELP 6.0 - 8.5 g/dL 7.4  Albumin ELP 2.9 - 4.4 g/dL 3.4  Globulin, Total 2.2 - 3.9 g/dL 4.0 (H) (C)  A/G Ratio 0.7 - 1.7  0.9 (C)  Alpha-1-Globulin 0.0 - 0.4 g/dL 0.3  Joeyj-7-Honalopw 0.4 - 1.0 g/dL 0.9  Beta Globulin 0.7 - 1.3 g/dL 1.0  Gamma Globulin 0.4 - 1.8 g/dL 1.8  M-SPIKE, % Not Observed g/dL 1.1 (H)  SPE Interp.  Comment   Comment  Comment  (H): Data is abnormally high (C): Corrected   Latest Reference  Range & Units 03/26/24 11:11  Kappa free light chain 3.3 - 19.4 mg/L 20.0 (H)  Lambda free light chains 5.7 - 26.3 mg/L 7.7  Kappa, lambda light chain ratio 0.26 - 1.65  2.60 (H)  (H): Data is abnormally high  RADIOGRAPHIC STUDIES: I have personally reviewed the radiological images as listed and agreed with the findings in the report.  Narrative & Impression  CLINICAL DATA:  Right chest wall pain.  No recent injury   EXAM: RIGHT RIBS - 2 VIEW   COMPARISON:  10/01/2023   FINDINGS: Left pacer in place with leads in the right atrium and right ventricle. Heart is normal size. Chronic markings throughout the right lung, likely scarring. No effusion or pneumothorax. No visible rib fracture.   IMPRESSION: No visible rib fracture.   No acute findings.     Electronically Signed   By: Franky Crease M.D.   On: 02/18/2024 20:54

## 2024-04-12 ENCOUNTER — Encounter (HOSPITAL_COMMUNITY): Payer: Self-pay | Admitting: Oncology

## 2024-04-12 ENCOUNTER — Encounter: Payer: Self-pay | Admitting: Oncology

## 2024-04-12 NOTE — Progress Notes (Signed)
 Orders received from Dr Davonna to change Velcade  to q 4 weeks.  Treatment plan updated.  Niels Molt, PharmD Clinical Pharmacist

## 2024-04-15 ENCOUNTER — Ambulatory Visit: Admitting: Urology

## 2024-04-17 ENCOUNTER — Other Ambulatory Visit: Payer: Self-pay | Admitting: Oncology

## 2024-04-17 ENCOUNTER — Other Ambulatory Visit: Payer: Self-pay

## 2024-04-17 ENCOUNTER — Other Ambulatory Visit: Payer: Self-pay | Admitting: Family Medicine

## 2024-04-17 DIAGNOSIS — J301 Allergic rhinitis due to pollen: Secondary | ICD-10-CM

## 2024-04-17 DIAGNOSIS — F339 Major depressive disorder, recurrent, unspecified: Secondary | ICD-10-CM

## 2024-04-17 DIAGNOSIS — R32 Unspecified urinary incontinence: Secondary | ICD-10-CM

## 2024-04-17 MED ORDER — LENALIDOMIDE 10 MG PO CAPS: ORAL_CAPSULE | ORAL | 0 refills | Status: DC

## 2024-04-17 NOTE — Telephone Encounter (Signed)
 Chart reviewed. Revlimid  refilled per last office note with Dr. Davonna.

## 2024-04-22 ENCOUNTER — Encounter: Payer: Self-pay | Admitting: Oncology

## 2024-04-22 ENCOUNTER — Ambulatory Visit: Attending: Internal Medicine | Admitting: *Deleted

## 2024-04-22 DIAGNOSIS — I480 Paroxysmal atrial fibrillation: Secondary | ICD-10-CM | POA: Diagnosis not present

## 2024-04-22 DIAGNOSIS — Z5181 Encounter for therapeutic drug level monitoring: Secondary | ICD-10-CM

## 2024-04-22 DIAGNOSIS — Z86711 Personal history of pulmonary embolism: Secondary | ICD-10-CM

## 2024-04-22 LAB — POCT INR: INR: 4 — AB (ref 2.0–3.0)

## 2024-04-22 NOTE — Patient Instructions (Signed)
 Previously checked in [place].  Has been taking warfarin 6mg  daily. Hold warfarin tonight then decrease dose to 1 tablet daily except 1/2 tablet on Sundays and Wednesdays Recheck in 3 weeks

## 2024-04-22 NOTE — Progress Notes (Signed)
 INR 4.0; Please see anticoagulation encounter

## 2024-04-22 NOTE — Telephone Encounter (Signed)
 Please advise

## 2024-04-24 ENCOUNTER — Encounter: Payer: Self-pay | Admitting: *Deleted

## 2024-04-24 ENCOUNTER — Inpatient Hospital Stay

## 2024-04-24 ENCOUNTER — Ambulatory Visit: Payer: Self-pay | Admitting: *Deleted

## 2024-04-24 ENCOUNTER — Other Ambulatory Visit: Payer: Self-pay | Admitting: *Deleted

## 2024-04-24 DIAGNOSIS — I2699 Other pulmonary embolism without acute cor pulmonale: Secondary | ICD-10-CM

## 2024-04-24 MED ORDER — APIXABAN (ELIQUIS) VTE STARTER PACK (10MG AND 5MG): ORAL_TABLET | ORAL | 0 refills | Status: DC

## 2024-04-24 NOTE — Progress Notes (Signed)
 It was recommended by Dr. Almetta on 04/02/2024 that patient transition to Eliquis rather than coumadin due to labile INR.  Patient has history of PAF and PE while on coumadin. She was not interested a that time.  She reached out to our office via [facility] on 12/1 stating that she would like to try Eliquis.  Discussed with Dr. Davonna and she has agreed to manage.  Eliquis starter pack sent to pharmacy and patient aware of instructions.  Notified Dr. Almetta and Coumadin clinic of changes.

## 2024-04-29 ENCOUNTER — Ambulatory Visit: Admitting: Physician Assistant

## 2024-05-06 ENCOUNTER — Other Ambulatory Visit: Payer: Self-pay | Admitting: Oncology

## 2024-05-06 DIAGNOSIS — C9000 Multiple myeloma not having achieved remission: Secondary | ICD-10-CM

## 2024-05-08 ENCOUNTER — Inpatient Hospital Stay

## 2024-05-08 ENCOUNTER — Inpatient Hospital Stay: Attending: Hematology

## 2024-05-08 VITALS — BP 121/61 | HR 87 | Temp 97.9°F | Resp 20 | Ht 63.0 in | Wt 144.6 lb

## 2024-05-08 DIAGNOSIS — Z79899 Other long term (current) drug therapy: Secondary | ICD-10-CM | POA: Diagnosis not present

## 2024-05-08 DIAGNOSIS — Z5112 Encounter for antineoplastic immunotherapy: Secondary | ICD-10-CM | POA: Insufficient documentation

## 2024-05-08 DIAGNOSIS — C9000 Multiple myeloma not having achieved remission: Secondary | ICD-10-CM

## 2024-05-08 DIAGNOSIS — D509 Iron deficiency anemia, unspecified: Secondary | ICD-10-CM

## 2024-05-08 LAB — COMPREHENSIVE METABOLIC PANEL WITH GFR
ALT: 22 U/L (ref 0–44)
AST: 21 U/L (ref 15–41)
Albumin: 3.9 g/dL (ref 3.5–5.0)
Alkaline Phosphatase: 63 U/L (ref 38–126)
Anion gap: 13 (ref 5–15)
BUN: 17 mg/dL (ref 8–23)
CO2: 25 mmol/L (ref 22–32)
Calcium: 8.8 mg/dL — ABNORMAL LOW (ref 8.9–10.3)
Chloride: 105 mmol/L (ref 98–111)
Creatinine, Ser: 0.84 mg/dL (ref 0.44–1.00)
GFR, Estimated: >60 mL/min (ref >60)
Glucose, Bld: 56 mg/dL — ABNORMAL LOW (ref 70–99)
Potassium: 3.1 mmol/L — ABNORMAL LOW (ref 3.5–5.1)
Sodium: 143 mmol/L (ref 135–145)
Total Bilirubin: 0.3 mg/dL (ref 0.0–1.2)
Total Protein: 7.4 g/dL (ref 6.5–8.1)

## 2024-05-08 LAB — CBC WITH DIFFERENTIAL/PLATELET
Abs Immature Granulocytes: 0.01 K/uL (ref 0.00–0.07)
Basophils Absolute: 0.1 K/uL (ref 0.0–0.1)
Basophils Relative: 1 %
Eosinophils Absolute: 0.4 K/uL (ref 0.0–0.5)
Eosinophils Relative: 10 %
HCT: 34.4 % — ABNORMAL LOW (ref 36.0–46.0)
Hemoglobin: 11.5 g/dL — ABNORMAL LOW (ref 12.0–15.0)
Immature Granulocytes: 0 %
Lymphocytes Relative: 26 %
Lymphs Abs: 0.9 K/uL (ref 0.7–4.0)
MCH: 34.8 pg — ABNORMAL HIGH (ref 26.0–34.0)
MCHC: 33.4 g/dL (ref 30.0–36.0)
MCV: 104.2 fL — ABNORMAL HIGH (ref 80.0–100.0)
Monocytes Absolute: 0.3 K/uL (ref 0.1–1.0)
Monocytes Relative: 8 %
Neutro Abs: 1.9 K/uL (ref 1.7–7.7)
Neutrophils Relative %: 55 %
Platelets: 123 K/uL — ABNORMAL LOW (ref 150–400)
RBC: 3.3 MIL/uL — ABNORMAL LOW (ref 3.87–5.11)
RDW: 17.7 % — ABNORMAL HIGH (ref 11.5–15.5)
WBC: 3.5 K/uL — ABNORMAL LOW (ref 4.0–10.5)
nRBC: 0 % (ref 0.0–0.2)

## 2024-05-08 LAB — MAGNESIUM: Magnesium: 2.2 mg/dL (ref 1.7–2.4)

## 2024-05-08 MED ORDER — POTASSIUM CHLORIDE CRYS ER 20 MEQ PO TBCR
40.0000 meq | EXTENDED_RELEASE_TABLET | Freq: Once | ORAL | Status: AC
  Administered 2024-05-08: 13:00:00 40 meq via ORAL
  Filled 2024-05-08: qty 2

## 2024-05-08 MED ORDER — PROCHLORPERAZINE MALEATE 10 MG PO TABS
10.0000 mg | ORAL_TABLET | Freq: Four times a day (QID) | ORAL | Status: DC | PRN
  Administered 2024-05-08: 13:00:00 10 mg via ORAL
  Filled 2024-05-08: qty 1

## 2024-05-08 MED ORDER — CETIRIZINE HCL 10 MG PO TABS
10.0000 mg | ORAL_TABLET | Freq: Once | ORAL | Status: AC
  Administered 2024-05-08: 13:00:00 10 mg via ORAL
  Filled 2024-05-08: qty 1

## 2024-05-08 MED ORDER — DENOSUMAB 120 MG/1.7ML ~~LOC~~ SOLN
120.0000 mg | Freq: Once | SUBCUTANEOUS | Status: AC
  Administered 2024-05-08: 14:00:00 120 mg via SUBCUTANEOUS
  Filled 2024-05-08: qty 1.7

## 2024-05-08 MED ORDER — BORTEZOMIB CHEMO SQ INJECTION 3.5 MG (2.5MG/ML)
0.7000 mg/m2 | Freq: Once | INTRAMUSCULAR | Status: AC
  Administered 2024-05-08: 14:00:00 1.25 mg via SUBCUTANEOUS
  Filled 2024-05-08: qty 0.5

## 2024-05-08 MED ORDER — DEXAMETHASONE 4 MG PO TABS
20.0000 mg | ORAL_TABLET | Freq: Once | ORAL | Status: AC
  Administered 2024-05-08: 13:00:00 20 mg via ORAL
  Filled 2024-05-08: qty 5

## 2024-05-08 MED ORDER — ACETAMINOPHEN 325 MG PO TABS
650.0000 mg | ORAL_TABLET | Freq: Once | ORAL | Status: AC
  Administered 2024-05-08: 13:00:00 650 mg via ORAL
  Filled 2024-05-08: qty 2

## 2024-05-08 MED ORDER — DARATUMUMAB-HYALURONIDASE-FIHJ 1800-30000 MG-UT/15ML ~~LOC~~ SOLN
1800.0000 mg | Freq: Once | SUBCUTANEOUS | Status: AC
  Administered 2024-05-08: 14:00:00 1800 mg via SUBCUTANEOUS
  Filled 2024-05-08: qty 15

## 2024-05-08 NOTE — Progress Notes (Signed)
 Patient presents today for Daratumumab ,  Velcade  and Xgeva  injections per providers order.  Patient has been taking Calcium /vitamin D  supplements, has had no jaw pain or prior or upcoming dental work.  Vital signs and labs within parameters for treatment.  Patient will also get 40 mEq of Potassium PO with treatment.    Stable during administration without incident; injection site WNL; see MAR for injection details.  Patient tolerated procedure well and without incident.  No questions or complaints noted at this time.

## 2024-05-08 NOTE — Patient Instructions (Signed)
 [facility] [place] - A DEPT OF [facility]  Discharge Instructions: Thank you for choosing [facility] to provide your oncology and hematology care.  If you have a lab appointment with the Cancer Center - please note that after April 8th, 2024, all labs will be drawn in the cancer center.  You do not have to check in or register with the main entrance as you have in the past but will complete your check-in in the cancer center.  Wear comfortable clothing and clothing appropriate for easy access to any Portacath or PICC line.   We strive to give you quality time with your provider. You may need to reschedule your appointment if you arrive late (15 or more minutes).  Arriving late affects you and other patients whose appointments are after yours.  Also, if you miss three or more appointments without notifying the office, you may be dismissed from the clinic at the providers discretion.      For prescription refill requests, have your pharmacy contact our office and allow 72 hours for refills to be completed.    Today you received the following chemotherapy and/or immunotherapy agents Daratumumab /Velcade /Xgeva       To help prevent nausea and vomiting after your treatment, we encourage you to take your nausea medication as directed.  BELOW ARE SYMPTOMS THAT SHOULD BE REPORTED IMMEDIATELY: *FEVER GREATER THAN 100.4 F (38 C) OR HIGHER *CHILLS OR SWEATING *NAUSEA AND VOMITING THAT IS NOT CONTROLLED WITH YOUR NAUSEA MEDICATION *UNUSUAL SHORTNESS OF BREATH *UNUSUAL BRUISING OR BLEEDING *URINARY PROBLEMS (pain or burning when urinating, or frequent urination) *BOWEL PROBLEMS (unusual diarrhea, constipation, pain near the anus) TENDERNESS IN MOUTH AND THROAT WITH OR WITHOUT PRESENCE OF ULCERS (sore throat, sores in mouth, or a toothache) UNUSUAL RASH, SWELLING OR PAIN  UNUSUAL VAGINAL DISCHARGE OR ITCHING   Items with * indicate a potential emergency and  should be followed up as soon as possible or go to the Emergency Department if any problems should occur.  Please show the CHEMOTHERAPY ALERT CARD or IMMUNOTHERAPY ALERT CARD at check-in to the Emergency Department and triage nurse.  Should you have questions after your visit or need to cancel or reschedule your appointment, please contact [facility]  - A DEPT OF [facility] [phone]  and follow the prompts.  Office hours are 8:00 a.m. to 4:30 p.m. Monday - Friday. Please note that voicemails left after 4:00 p.m. may not be returned until the following business day.  We are closed weekends and major holidays. You have access to a nurse at all times for urgent questions. Please call the main number to the clinic [phone] and follow the prompts.  For any non-urgent questions, you may also contact your provider using MyChart. We now offer e-Visits for anyone 49 and older to request care online for non-urgent symptoms. For details visit [email or web address].   Also download the MyChart app! Go to the app store, search MyChart, open the app, select [place], and log in with your MyChart username and password.

## 2024-05-13 ENCOUNTER — Ambulatory Visit

## 2024-05-13 ENCOUNTER — Ambulatory Visit: Payer: Self-pay

## 2024-05-13 NOTE — Telephone Encounter (Signed)
 FYI Only or Action Required?: FYI only for provider: appointment scheduled on 05/14/24. Appt scheduled prior to triage  Patient was last seen in primary care on 12/25/2023 by Bevely Doffing, FNP.  Called Nurse Triage reporting Abdominal Cramping.  Symptoms began 2 weeks ago.  Interventions attempted: Rest, hydration, or home remedies.  Symptoms are: unchanged.  Triage Disposition: See Physician Within 24 Hours  Patient/caregiver understands and will follow disposition?: Yes   Reason for Disposition  [1] MODERATE pain (e.g., interferes with normal activities) AND [2] pain comes and goes (cramps) AND [3] present > 24 hours  (Exception: Pain with Vomiting or Diarrhea - see that Guideline.)  Answer Assessment - Initial Assessment Questions 2. RADIATION: Does the pain shoot anywhere else? (e.g., chest, back)     None 3. ONSET: When did the pain begin? (e.g., minutes, hours or days ago)      X 2 weeks 4. SUDDEN: Gradual or sudden onset?     Gradual 5. PATTERN Does the pain come and go, or is it constant?     Intermittent 6. SEVERITY: How bad is the pain?  (e.g., Scale 1-10; mild, moderate, or severe)     7/10 7. RECURRENT SYMPTOM: Have you ever had this type of stomach pain before? If Yes, ask: When was the last time? and What happened that time?      Yes, pt went to specialist 8. CAUSE: What do you think is causing the stomach pain? (e.g., gallstones, recent abdominal surgery)     Unknown 9. RELIEVING/AGGRAVATING FACTORS: What makes it better or worse? (e.g., antacids, bending or twisting motion, bowel movement)     Pt notes she has to watch what she eats 10. OTHER SYMPTOMS: Do you have any other symptoms? (e.g., back pain, diarrhea, fever, urination pain, vomiting)       Diarrhea  Protocols used: Abdominal Pain - Female-A-AH

## 2024-05-13 NOTE — Telephone Encounter (Signed)
 Attempt # 1 to reach patient to triage symptoms. Unable to leave VM to call back -- VM FULL   Message from [place]wood H sent at 05/13/2024  3:40 PM EST  Reason for Triage: Patient and stated she having diarrhea, dull stomach pain, and cramping. Did not state it was severe.   [phone]

## 2024-05-14 ENCOUNTER — Other Ambulatory Visit: Payer: Self-pay

## 2024-05-14 ENCOUNTER — Ambulatory Visit: Admitting: Family Medicine

## 2024-05-14 VITALS — BP 127/68 | HR 72 | Temp 97.5°F | Ht 63.0 in | Wt 140.0 lb

## 2024-05-14 DIAGNOSIS — R197 Diarrhea, unspecified: Secondary | ICD-10-CM | POA: Diagnosis not present

## 2024-05-14 MED ORDER — LENALIDOMIDE 10 MG PO CAPS: ORAL_CAPSULE | ORAL | 0 refills | Status: DC

## 2024-05-14 NOTE — Telephone Encounter (Signed)
Noted patient scheduled

## 2024-05-14 NOTE — Telephone Encounter (Signed)
 Chart reviewed. Revlimid  refilled per last office note with Dr. Davonna.

## 2024-05-14 NOTE — Patient Instructions (Signed)
 Labs ordered.  We will call with results as soon as we can.  Take care  Dr. Bluford

## 2024-05-15 ENCOUNTER — Other Ambulatory Visit: Payer: Self-pay | Admitting: Oncology

## 2024-05-15 ENCOUNTER — Encounter: Payer: Self-pay | Admitting: Oncology

## 2024-05-15 ENCOUNTER — Other Ambulatory Visit: Payer: Self-pay | Admitting: Family Medicine

## 2024-05-15 DIAGNOSIS — K589 Irritable bowel syndrome without diarrhea: Secondary | ICD-10-CM

## 2024-05-15 LAB — BASIC METABOLIC PANEL WITH GFR
BUN/Creatinine Ratio: 17 (ref 12–28)
BUN: 17 mg/dL (ref 8–27)
CO2: 25 mmol/L (ref 20–29)
Calcium: 8.5 mg/dL — ABNORMAL LOW (ref 8.7–10.3)
Chloride: 105 mmol/L (ref 96–106)
Creatinine, Ser: 1 mg/dL (ref 0.57–1.00)
Glucose: 106 mg/dL — ABNORMAL HIGH (ref 70–99)
Potassium: 3.3 mmol/L — ABNORMAL LOW (ref 3.5–5.2)
Sodium: 144 mmol/L (ref 134–144)
eGFR: 59 mL/min/1.73 — ABNORMAL LOW

## 2024-05-17 ENCOUNTER — Ambulatory Visit: Payer: Self-pay | Admitting: Family Medicine

## 2024-05-20 ENCOUNTER — Other Ambulatory Visit: Payer: Self-pay | Admitting: Family Medicine

## 2024-05-20 ENCOUNTER — Encounter: Payer: Self-pay | Admitting: *Deleted

## 2024-05-20 DIAGNOSIS — C9000 Multiple myeloma not having achieved remission: Secondary | ICD-10-CM

## 2024-05-20 DIAGNOSIS — E876 Hypokalemia: Secondary | ICD-10-CM

## 2024-05-20 DIAGNOSIS — R197 Diarrhea, unspecified: Secondary | ICD-10-CM | POA: Insufficient documentation

## 2024-05-20 LAB — IFE, DARA-SPECIFIC, SERUM
IgA: 1895 mg/dL — ABNORMAL HIGH (ref 64–422)
IgG (Immunoglobin G), Serum: 465 mg/dL — ABNORMAL LOW (ref 586–1602)
IgM (Immunoglobulin M), Srm: 19 mg/dL — ABNORMAL LOW (ref 26–217)

## 2024-05-20 MED ORDER — POTASSIUM CHLORIDE CRYS ER 20 MEQ PO TBCR: 40.0000 meq | EXTENDED_RELEASE_TABLET | Freq: Two times a day (BID) | ORAL | 0 refills | Status: DC

## 2024-05-20 NOTE — Progress Notes (Signed)
 "  Subjective:  Patient ID: Courtney Rogers, female    DOB: [birth year]  Age: 73 y.o. MRN: [id number]  CC:   Chief Complaint  Patient presents with   Diarrhea    2 to 3 weeks , low appetite not drinking much, taken pepto bismol and imodium  today, last BM was yesterday    HPI:  73 year old female with an extensive PMH presents for evaluation of the above.  2-3 week history of diarrhea. Associated abdominal discomfort that comes and goes. No fever. No vomiting. Decreased appetite. Has used pepto bismol and Imodium  without resolution. Has known IBS. No recent antibiotic use.  Patient Active Problem List   Diagnosis Date Noted   Diarrhea 05/20/2024   PAF (paroxysmal atrial fibrillation) (HCC) 03/01/2024   Peripheral neuropathy 01/14/2024   Major depressive disorder, single episode, unspecified 10/12/2023   Generalized anxiety disorder 10/12/2023   Right kidney stone 08/23/2023   Cervical radiculopathy 06/27/2023   Sleep disturbance 02/26/2023   Numbness and tingling of both feet 11/15/2022   OAB (overactive bladder) 10/24/2022   Vaginal wall prolapse 10/24/2022   Pulmonary embolism (HCC) 10/24/2022   Obstructive sleep apnea syndrome 10/24/2022   Midline cystocele 10/24/2022   Seasonal allergic rhinitis 10/24/2022   Multiple myeloma (HCC) 10/24/2022   Old myocardial infarction 09/19/2021   Abnormal gait 05/06/2021   Atherosclerotic heart disease of native coronary artery without angina pectoris 05/06/2021   Pacemaker 05/06/2021   Chronic pain 05/06/2021   Panic disorder 05/06/2021   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   Mild intermittent asthma 05/06/2021   Mixed hyperlipidemia 05/06/2021   Obesity 05/06/2021   Recurrent major depression in remission 05/06/2021   Severe recurrent major depression without psychotic features (HCC) 05/06/2021   Mixed stress and urge incontinence 05/06/2021   Tremors of nervous system 04/13/2021   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   Wedge compression  fracture of first lumbar vertebra, subsequent encounter for fracture with routine healing 04/06/2020   Body mass index (BMI) 28.0-28.9, adult 01/14/2020   DDD (degenerative disc disease), cervical 01/02/2020   Essential hypertension 12/14/2017   Iron deficiency anemia 08/03/2017   Normocytic anemia 01/12/2017   Fatty liver 11/03/2015   GERD 06/01/2010   Irritable bowel syndrome 07/21/2009    Social Hx   Social History   Socioeconomic History   Marital status: Married    Spouse name: Doctor, General Practice   Number of children: 3   Years of education: 16   Highest education level: Master's degree (e.g., MA, MS, MEng, MEd, MSW, MBA)  Occupational History   Not on file  Tobacco Use   Smoking status: Never    Passive exposure: Never   Smokeless tobacco: Never   Tobacco comments:    Never smoked   Vaping Use   Vaping status: Never Used  Substance and Sexual Activity   Alcohol use: Not Currently    Comment: occ wine   Drug use: No   Sexual activity: Not Currently    Birth control/protection: Abstinence  Other Topics Concern   Not on file  Social History Narrative      Lives with husband-51 years 952 in Aug 2021   Daughter is close by, 2 sons live further away   One IN [place],ONE IN [place], ONE BESIDE HER.        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   Enjoys: reading, young adult      Diet: eats all food groups    Caffeine: coffee 1, tea daily soda-1 daily  Water : 1-2 cups      Wears seat belt    Does not use phone while driving   Smoke detectors at home    Licensed Conveyancer -locked up      Left handed   One story home   Drinks caffeine   Social Drivers of Health   Tobacco Use: Low Risk (04/10/2024)   Patient History    Smoking Tobacco Use: Never    Smokeless Tobacco Use: Never    Passive Exposure: Never  Financial Resource Strain: Low Risk (05/14/2024)   Overall Financial Resource Strain (CARDIA)    Difficulty of Paying Living Expenses: Not  hard at all  Food Insecurity: No Food Insecurity (05/14/2024)   Epic    Worried About Programme Researcher, Broadcasting/film/video in the Last Year: Never true    Ran Out of Food in the Last Year: Never true  Transportation Needs: No Transportation Needs (05/14/2024)   Epic    Lack of Transportation (Medical): No    Lack of Transportation (Non-Medical): No  Physical Activity: Insufficiently Active (05/14/2024)   Exercise Vital Sign    Days of Exercise per Week: 2 days    Minutes of Exercise per Session: 10 min  Stress: Stress Concern Present (05/14/2024)   [employer] of Occupational Health - Occupational Stress Questionnaire    Feeling of Stress: Very much  Social Connections: Socially Integrated (05/14/2024)   Social Connection and Isolation Panel    Frequency of Communication with Friends and Family: Three times a week    Frequency of Social Gatherings with Friends and Family: Once a week    Attends Religious Services: 1 to 4 times per year    Active Member of Clubs or Organizations: Yes    Attends Banker Meetings: 1 to 4 times per year    Marital Status: Married  Depression (PHQ2-9): Low Risk (04/10/2024)   Depression (PHQ2-9)    PHQ-2 Score: 1  Alcohol Screen: Low Risk (12/27/2023)   Alcohol Screen    Last Alcohol Screening Score (AUDIT): 0  Housing: Low Risk (05/14/2024)   Epic    Unable to Pay for Housing in the Last Year: No    Number of Times Moved in the Last Year: 0    Homeless in the Last Year: No  Utilities: Not At Risk (12/27/2023)   Epic    Threatened with loss of utilities: No  Health Literacy: Adequate Health Literacy (12/27/2023)   B1300 Health Literacy    Frequency of need for help with medical instructions: Never    Review of Systems Per HPI  Objective:  BP 127/68   Pulse 72   Temp (!) 97.5 F (36.4 C)   Ht 5' 3 (1.6 m)   Wt 140 lb (63.5 kg)   SpO2 99%   BMI 24.80 kg/m      05/14/2024    3:08 PM 05/08/2024   12:00 PM 04/10/2024    1:10 PM   BP/Weight  Systolic BP 127 121 105  Diastolic BP 68 61 79  Wt. (Lbs) 140 144.6 136.4  BMI 24.8 kg/m2 25.61 kg/m2 24.16 kg/m2    Physical Exam Vitals and nursing note reviewed.  Constitutional:      General: She is not in acute distress.    Appearance: Normal appearance.  Pulmonary:     Effort: Pulmonary effort is normal.     Breath sounds: Normal breath sounds. No wheezing or rales.  Abdominal:     General: There is  no distension.     Palpations: Abdomen is soft.     Tenderness: There is no abdominal tenderness.  Neurological:     Mental Status: She is alert.     Lab Results  Component Value Date   WBC 3.5 (L) 05/08/2024   HGB 11.5 (L) 05/08/2024   HCT 34.4 (L) 05/08/2024   PLT 123 (L) 05/08/2024   GLUCOSE 106 (H) 05/14/2024   CHOL 146 06/27/2022   TRIG 102 06/27/2022   HDL 65 06/27/2022   LDLCALC 62 06/27/2022   ALT 22 05/08/2024   AST 21 05/08/2024   NA 144 05/14/2024   K 3.3 (L) 05/14/2024   CL 105 05/14/2024   CREATININE 1.00 05/14/2024   BUN 17 05/14/2024   CO2 25 05/14/2024   TSH 0.694 02/24/2023   INR 4.0 (A) 04/22/2024   HGBA1C 5.6 02/24/2023     Assessment & Plan:  Diarrhea, unspecified type Assessment & Plan: Metabolic panel to assess electrolytes. Further evaluation with stool testing. No indication for imaging at this time.  Orders: -     Basic metabolic panel with GFR -     GI Profile, Stool, PCR    Follow-up:  Needs follow up with PCP  Jacqulyn Ahle DO [facility] "

## 2024-05-20 NOTE — Assessment & Plan Note (Signed)
 Metabolic panel to assess electrolytes. Further evaluation with stool testing. No indication for imaging at this time.

## 2024-05-22 ENCOUNTER — Telehealth: Payer: Self-pay

## 2024-05-22 ENCOUNTER — Inpatient Hospital Stay

## 2024-05-22 NOTE — Telephone Encounter (Signed)
 Copied from CRM #[id number]. Topic: Clinical - Prescription Issue >> May 20, 2024  2:42 PM Tobias CROME wrote: Reason for CRM: [facility] calling for clarification on patient's potassium prescription.   Requesting callback: [place]  [phone]

## 2024-05-22 NOTE — Telephone Encounter (Signed)
"  Duplicate message.   "

## 2024-05-22 NOTE — Telephone Encounter (Signed)
 Copied from CRM [id number]. Topic: Clinical - Prescription Issue >> May 21, 2024  3:30 PM Darshell M wrote: Reason for CRM: Patient taking Potassium 20 meq, 1 tablet twice per day. Provider wrote prescription for 2 tablets, twice per day (its only a 3 day supply). Pharmacy confirming provider is aware patient is already taking 20 meq twice per day and the provider intended to increase it for only three days.   Pharmacist, [facility] - [place], [place] - [address]  [address] [place] [address]  Phone: [phone] Fax: [phone]

## 2024-05-22 NOTE — Telephone Encounter (Signed)
 Copied from CRM [id number]. Topic: Clinical - Medication Question >> May 20, 2024  2:32 PM Gustabo D wrote: Glenys with [facility]- needs clarification on pt medication-potassium chloride  SA (KLOR-CON  M) 20 MEQ tablet   Pharmacy info-  [facility] - [place], [place] - [address] [place] [address] Phone: [phone] Fax: [phone] Hours: Not open 24 hours  ---spoke with [employer] and patient's daughter Ms Chesley to make sure she was aware of her potassium results and 3 day increase then resume regular potassium dose afterwards. --- daughter states patient has been taking potassium 20 meq 1 bid.

## 2024-05-30 ENCOUNTER — Other Ambulatory Visit: Payer: Self-pay | Admitting: Family Medicine

## 2024-05-30 DIAGNOSIS — C9000 Multiple myeloma not having achieved remission: Secondary | ICD-10-CM

## 2024-05-30 DIAGNOSIS — E876 Hypokalemia: Secondary | ICD-10-CM

## 2024-05-30 MED ORDER — POTASSIUM CHLORIDE CRYS ER 20 MEQ PO TBCR: 20.0000 meq | EXTENDED_RELEASE_TABLET | Freq: Two times a day (BID) | ORAL | 0 refills | Status: AC

## 2024-06-05 ENCOUNTER — Inpatient Hospital Stay

## 2024-06-05 ENCOUNTER — Other Ambulatory Visit: Payer: Self-pay | Admitting: *Deleted

## 2024-06-05 ENCOUNTER — Inpatient Hospital Stay: Attending: Hematology | Admitting: Oncology

## 2024-06-05 VITALS — BP 95/63 | HR 71 | Temp 97.8°F | Resp 18 | Wt 138.4 lb

## 2024-06-05 DIAGNOSIS — E876 Hypokalemia: Secondary | ICD-10-CM | POA: Diagnosis not present

## 2024-06-05 DIAGNOSIS — C9000 Multiple myeloma not having achieved remission: Secondary | ICD-10-CM | POA: Insufficient documentation

## 2024-06-05 DIAGNOSIS — G629 Polyneuropathy, unspecified: Secondary | ICD-10-CM | POA: Diagnosis not present

## 2024-06-05 DIAGNOSIS — Z79899 Other long term (current) drug therapy: Secondary | ICD-10-CM | POA: Insufficient documentation

## 2024-06-05 DIAGNOSIS — I2699 Other pulmonary embolism without acute cor pulmonale: Secondary | ICD-10-CM

## 2024-06-05 DIAGNOSIS — D509 Iron deficiency anemia, unspecified: Secondary | ICD-10-CM

## 2024-06-05 DIAGNOSIS — Z7901 Long term (current) use of anticoagulants: Secondary | ICD-10-CM | POA: Diagnosis not present

## 2024-06-05 DIAGNOSIS — G6289 Other specified polyneuropathies: Secondary | ICD-10-CM

## 2024-06-05 DIAGNOSIS — Z5112 Encounter for antineoplastic immunotherapy: Secondary | ICD-10-CM | POA: Insufficient documentation

## 2024-06-05 DIAGNOSIS — Z86711 Personal history of pulmonary embolism: Secondary | ICD-10-CM | POA: Diagnosis not present

## 2024-06-05 LAB — CBC WITH DIFFERENTIAL/PLATELET
Abs Immature Granulocytes: 0.01 K/uL (ref 0.00–0.07)
Basophils Absolute: 0.1 K/uL (ref 0.0–0.1)
Basophils Relative: 1 %
Eosinophils Absolute: 0.3 K/uL (ref 0.0–0.5)
Eosinophils Relative: 8 %
HCT: 37.1 % (ref 36.0–46.0)
Hemoglobin: 12.4 g/dL (ref 12.0–15.0)
Immature Granulocytes: 0 %
Lymphocytes Relative: 28 %
Lymphs Abs: 1.1 K/uL (ref 0.7–4.0)
MCH: 35.2 pg — ABNORMAL HIGH (ref 26.0–34.0)
MCHC: 33.4 g/dL (ref 30.0–36.0)
MCV: 105.4 fL — ABNORMAL HIGH (ref 80.0–100.0)
Monocytes Absolute: 0.3 K/uL (ref 0.1–1.0)
Monocytes Relative: 7 %
Neutro Abs: 2.2 K/uL (ref 1.7–7.7)
Neutrophils Relative %: 56 %
Platelets: 137 K/uL — ABNORMAL LOW (ref 150–400)
RBC: 3.52 MIL/uL — ABNORMAL LOW (ref 3.87–5.11)
RDW: 16.7 % — ABNORMAL HIGH (ref 11.5–15.5)
WBC: 4 K/uL (ref 4.0–10.5)
nRBC: 0 % (ref 0.0–0.2)

## 2024-06-05 LAB — COMPREHENSIVE METABOLIC PANEL WITH GFR
ALT: 14 U/L (ref 0–44)
AST: 17 U/L (ref 15–41)
Albumin: 4.1 g/dL (ref 3.5–5.0)
Alkaline Phosphatase: 50 U/L (ref 38–126)
Anion gap: 12 (ref 5–15)
BUN: 17 mg/dL (ref 8–23)
CO2: 28 mmol/L (ref 22–32)
Calcium: 9.2 mg/dL (ref 8.9–10.3)
Chloride: 103 mmol/L (ref 98–111)
Creatinine, Ser: 0.98 mg/dL (ref 0.44–1.00)
GFR, Estimated: 60 mL/min — ABNORMAL LOW
Glucose, Bld: 80 mg/dL (ref 70–99)
Potassium: 3.3 mmol/L — ABNORMAL LOW (ref 3.5–5.1)
Sodium: 144 mmol/L (ref 135–145)
Total Bilirubin: 0.5 mg/dL (ref 0.0–1.2)
Total Protein: 7.7 g/dL (ref 6.5–8.1)

## 2024-06-05 LAB — MAGNESIUM: Magnesium: 2.2 mg/dL (ref 1.7–2.4)

## 2024-06-05 MED ORDER — BORTEZOMIB CHEMO SQ INJECTION 3.5 MG (2.5MG/ML)
0.7000 mg/m2 | Freq: Once | INTRAMUSCULAR | Status: AC
  Administered 2024-06-05: 1.25 mg via SUBCUTANEOUS
  Filled 2024-06-05: qty 0.5

## 2024-06-05 MED ORDER — CETIRIZINE HCL 10 MG PO TABS
10.0000 mg | ORAL_TABLET | Freq: Once | ORAL | Status: AC
  Administered 2024-06-05: 10 mg via ORAL
  Filled 2024-06-05: qty 1

## 2024-06-05 MED ORDER — ACETAMINOPHEN 325 MG PO TABS
650.0000 mg | ORAL_TABLET | Freq: Once | ORAL | Status: AC
  Administered 2024-06-05: 650 mg via ORAL
  Filled 2024-06-05: qty 2

## 2024-06-05 MED ORDER — DEXAMETHASONE 4 MG PO TABS
20.0000 mg | ORAL_TABLET | Freq: Once | ORAL | Status: AC
  Administered 2024-06-05: 20 mg via ORAL
  Filled 2024-06-05: qty 5

## 2024-06-05 MED ORDER — DENOSUMAB 120 MG/1.7ML ~~LOC~~ SOLN
120.0000 mg | Freq: Once | SUBCUTANEOUS | Status: AC
  Administered 2024-06-05: 120 mg via SUBCUTANEOUS
  Filled 2024-06-05: qty 1.7

## 2024-06-05 MED ORDER — PROCHLORPERAZINE MALEATE 10 MG PO TABS
10.0000 mg | ORAL_TABLET | Freq: Once | ORAL | Status: AC
  Administered 2024-06-05: 10 mg via ORAL
  Filled 2024-06-05: qty 1

## 2024-06-05 MED ORDER — PREGABALIN 75 MG PO CAPS: 75.0000 mg | ORAL_CAPSULE | Freq: Two times a day (BID) | ORAL | 3 refills | Status: AC

## 2024-06-05 MED ORDER — DARATUMUMAB-HYALURONIDASE-FIHJ 1800-30000 MG-UT/15ML ~~LOC~~ SOLN
1800.0000 mg | Freq: Once | SUBCUTANEOUS | Status: AC
  Administered 2024-06-05: 1800 mg via SUBCUTANEOUS
  Filled 2024-06-05: qty 15

## 2024-06-05 NOTE — Progress Notes (Signed)
 " Patient Care Team: Bacchus, Meade PEDLAR, FNP as PCP - General (Family Medicine) Stacia Diannah SQUIBB, MD as PCP - Cardiology (Cardiology) Almetta Donnice LABOR, MD as PCP - Electrophysiology (Clinical Cardiac Electrophysiology) Tanda Loa MATSU, RN as Oncology Nurse Navigator (Oncology) Cindie Carlin POUR, DO as Consulting Physician (Internal Medicine) Frances Ozell RAMAN, LCSW as [facility] Care Management (Licensed Clinical Social Worker) Davonna Siad, MD as Consulting Physician (Oncology)  Clinic Day:  06/05/2024  Referring physician: Edman Meade PEDLAR, FNP   CHIEF COMPLAINT:  CC: Stage II IgA kappa multiple myeloma, 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 S/p induction with Dara VRD.  Patient refused transplantation as per documentation Maintenance on Velcade  and Revlimid  with recent worsening of multiple myeloma labs and was restarted on daratumumab  Currently on daratumumab , Velcade  and Revlimid    - Reviewed labs today.  CMP: Potassium: 3.3, creatinine: Normal, LFTs: Stable, calcium : 9.2, CBC: WBC: 4.0, hemoglobin: 12.4, platelets: 137. -Kappa free light chain: 20, lambda free light chain: 7.7, ratio: 2.60, SPEP: M spike: 1.1 but has an additional M spike at a concentration of 0.3(likely from daratumumab ). Pending myeloma labs from today - Continue daratumumab , Velcade  and Revlimid  as scheduled.  -Physical exam stable today.  Can proceed with chemotherapy today. - Continue acyclovir  twice daily - Will draw multiple myeloma labs with every other treatment   Return to clinic in 2 months, prior to treatment  Hypokalemia Persistent despite supplementation. Possible contributing factors include diarrhea and increased urination.    - Administered potassium supplementation today. - Ensure potassium is taken twice daily as prescribed.  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 Sensation of dirt under fingernails.  Patient reports worsening numbness and tingling in her hands    - Increase Lyrica /pregabalin   to 75 mg twice daily.  Pulmonary embolism Patient has a history of PE and is on Coumadin   - Considering patient is on Revlimid , high risk of thrombosis, will need indefinite anticoagulation - Continue Coumadin as prescribed   The patient understands the plans discussed today and is in agreement with them.  She knows to contact our office if she develops concerns prior to her next appointment.  The total time spent in the appointment was 40 minutes for the encounter with patient, including review of chart and various tests results, discussions about plan of care and coordination of care plan    Siad Davonna, MD  [facility] - A DEPT OF [facility] [id number] [name] [address] Dept: [phone] Dept Fax: [phone]   Orders Placed This Encounter  Procedures   Kappa/lambda light chains    Standing Status:   Future    Expected Date:   06/05/2024    Expiration Date:   09/03/2024   Protein electrophoresis, serum    Standing Status:   Future    Expected Date:   06/05/2024    Expiration Date:   09/03/2024     ONCOLOGY HISTORY:    Diagnosis: Stage II IgA kappa multiple myeloma, 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 IFE: biclonal IgA protein with kappa specificity. NO CRAB criteria -06/25/2019: FLC ratio  4.80. LDH 207. Beta-2  microglobulin 2.8. M-spike 2.5.IFE:  No CRAB criteria -07/09/2019: Bone Marrow Biopsy.  Pathology: Hypercellular bone marrow for age with kappa light chain restricted plasma cell neoplasm representing 36% of all cells (Smoldering  myeloma) -FISH: Normal.  -07/09/2019: Initial PET: No findings of active malignancy  -08/20/2019: SPEP:M spike: 2.3. Hb:10.1, Normal Creatinine and calcium  -02/07/2020: DXA scan with T-score -0.5.  -06/09/2021: Hb:10.8, Cr:1.05, Ca:10.2, LDH:135, M spike:3.5. FLC ratio: 17.43 (Worsening myeloma panel) -07/12/2021: Bone marrow Biopsy.  -Pathology: Hypercellular bone marrow with kappa light chain restricted plasma cell neoplasm representing 30% of all cells.  -FISH: Gain of 1 q. (high risk), T p53 deletion (high risk), monosomy 13 (standard risk)  -Cytogenetics: Complex karyotype.  -06/24/2021: PET: Progressive myeloma, with several scattered hypermetabolic skeletal lesions including the bony pelvis, left mid humerus, bilateral mid femurs, right superior pubic ramus, and left calvarium. -07/29/2021: 2D echo:  EF 50-55%. There is distal septal and inferior apical hypokinesis.  -08/10/2021-11/30/2021: 6 cycles of Dara VRD -09/09/2021: Patient met with Dr. Darwyn at [facility] for bone marrow transplant.  As per documentation by her previous oncologist on 10/19/2021, patient refused pursuing transplant option. -12/07/2021: Myeloma panel: M-spike 0.2g and normal FLC ratio. Negative DIRA test. -12/20/2021 -Current: Maintenance Velcade  0.7 mg/m every 2 weeks and Revlimid  3 weeks on/1 week off  -04/10/2024: Changed Velcade  to 0.7 mg/m every 4 weeks  -08/10/2023: Revlimid  dose increased to 10 mg 3 weeks on/1 week off due to increased M spike of 0.4 (missed Revlimid  in 10/2023) -09/2023-12/2023: Worsening MM labs- M spike and FLC -01/03/2024: Daratumumab  added to regimen due worsening myeloma labs -01/04/2024: PET: No evidence of active myeloma. No evidence of plasmacytoma.  -01/12/2024: SPEP: M spike: 0.8, kappa free light chain: 14.4, lambda free light chain: 10.5, ratio: 1.37.  IFE: Biclonal IgA protein with kappa specificity.   Current Treatment:  Daratumumab  (monthly) +  Velcade (0.7mg /m2 every 4 weeks) + Revlimid  (10mg  3 weeks on/one week off)  INTERVAL HISTORY:   Discussed the use of AI scribe software for clinical note transcription with the patient, who gave verbal consent to proceed.  History of Present Illness Courtney Rogers is a 74 year old female with multiple myeloma who presents for oncology follow-up.  She reports increased fatigue, describing herself as dragging and more tired than usual. She spends most of her time indoors, either in her chair or on her porch, and has not been going out. She notes some weight loss but maintains a good appetite and continues to eat, with meals prepared by her sister and supplemented by food from a local center.  She has worsening numbness and tingling in her fingers, which she reports has worsened since the Velcade  dosing interval was changed to every four weeks. She currently takes Lyrica  for neuropathy.  She confirms adherence to all prescribed medications. Recent laboratory studies, including prior multiple myeloma labs, have been reassuring, though the most current myeloma-specific results are pending.   I have reviewed the past medical history, past surgical history, social history and family history with the patient and they are unchanged from previous note.  ALLERGIES:  is allergic to amoxicillin-pot clavulanate, clarithromycin, erythromycin, lisinopril, and amoxicillin.  MEDICATIONS:  Current Outpatient Medications  Medication Sig Dispense Refill   acetaminophen  (TYLENOL ) 500 MG tablet Take 1,000 mg by mouth 2 (two) times daily as needed for moderate pain or headache.     acyclovir  (ZOVIRAX ) 400 MG tablet TAKE ONE TABLET BY MOUTH TWICE DAILY 60 tablet 4  amLODipine  (NORVASC ) 10 MG tablet Take 10 mg by mouth.     APIXABAN  (ELIQUIS ) VTE STARTER PACK (10MG  AND 5MG ) Take as directed on package: start with two-5mg  tablets twice daily for 7 days. On day 8, switch to one-5mg  tablet twice daily. 74 each 0    B Complex-Folic Acid  (B COMPLEX VITAMINS, W/ FA,) CAPS Take 1 capsule by mouth daily.     Calcium  Carb-Cholecalciferol  (CALCIUM  600+D) 600-10 MG-MCG TABS Take 1 tablet by mouth 2 (two) times daily. 60 tablet 6   chlorthalidone (HYGROTON) 25 MG tablet Take 12.5 mg by mouth every morning.     citalopram  (CELEXA ) 20 MG tablet TAKE ONE TABLET BY MOUTH EVERY DAY 30 tablet 3   cyanocobalamin  (VITAMIN B12) 1000 MCG tablet Take 1,000 mcg by mouth daily.     diclofenac  Sodium (VOLTAREN ) 1 % GEL Apply 4 g topically 4 (four) times daily. 50 g 0   dicyclomine  (BENTYL ) 10 MG capsule TAKE ONE CAPSULE BY MOUTH THREE TIMES A DAY BEFORE meals 90 capsule 2   FEROSUL 325 (65 Fe) MG tablet Take 325 mg by mouth daily.     fluticasone  (FLONASE ) 50 MCG/ACT nasal spray USE 2 SPRAYS IN EACH NOSTRIL ONCE DAILY 16 g 0   folic acid  (FOLVITE ) 1 MG tablet TAKE ONE TABLET BY MOUTH ONCE DAILY 30 tablet 2   GNP VITAMIN C 500 MG tablet Take 500 mg by mouth daily.     lenalidomide  (REVLIMID ) 10 MG capsule Take 1 capsule (10 mg total) by mouth daily. 21 days on, 7 days off every 28 days 21 capsule 0   levocetirizine (XYZAL ) 5 MG tablet TAKE ONE TABLET BY MOUTH IN THE EVENING 30 tablet 3   lidocaine  (LIDODERM ) 5 % Place 1 patch onto the skin daily. Remove & Discard patch within 12 hours or as directed by MD 30 patch 0   magnesium  oxide (MAG-OX) 400 (240 Mg) MG tablet TAKE ONE TABLET BY MOUTH TWICE DAILY 60 tablet 4   metoprolol  tartrate (LOPRESSOR ) 50 MG tablet Take 1 tablet (50 mg total) by mouth 2 (two) times daily. 180 tablet 1   montelukast  (SINGULAIR ) 10 MG tablet TAKE ONE TABLET BY MOUTH AT BEDTIME 30 tablet 3   MYRBETRIQ  50 MG TB24 tablet TAKE ONE TABLET BY MOUTH ONCE DAILY 30 tablet 3   NICODERM CQ 14 MG/24HR patch 14 mg daily.     nitroGLYCERIN (NITROSTAT) 0.4 MG SL tablet Place 0.4 mg under the tongue every 5 (five) minutes as needed for chest pain.     pantoprazole  (PROTONIX ) 40 MG tablet TAKE ONE TABLET BY MOUTH TWICE  DAILY 90 tablet 3   potassium chloride  SA (KLOR-CON  M) 20 MEQ tablet Take 1 tablet (20 mEq total) by mouth 2 (two) times daily. 60 tablet 0   pregabalin  (LYRICA ) 50 MG capsule TAKE ONE CAPSULE BY MOUTH TWICE DAILY 60 capsule 4   primidone (MYSOLINE) 50 MG tablet TAKE ONE TABLET BY MOUTH TWICE DAILY 60 tablet 5   Probiotic Product (GNP PROBIOTIC COLON SUPPORT) CAPS Take 1 capsule by mouth daily.     sertraline  (ZOLOFT ) 50 MG tablet TAKE ONE TABLET BY MOUTH ONCE DAILY 30 tablet 3   simvastatin  (ZOCOR ) 20 MG tablet Take 20 mg by mouth at bedtime.     topiramate (TOPAMAX) 25 MG tablet Take 25 mg by mouth 2 (two) times daily.     traMADol  (ULTRAM ) 50 MG tablet Take 1 tablet (50 mg total) by mouth every 6 (six) hours as  needed. 20 tablet 0   No current facility-administered medications for this visit.    VITALS:  There were no vitals taken for this visit.  Wt Readings from Last 3 Encounters:  05/14/24 140 lb (63.5 kg)  05/08/24 144 lb 9.6 oz (65.6 kg)  04/10/24 136 lb 6.4 oz (61.9 kg)    There is no height or weight on file to calculate BMI.  Performance status (ECOG): 2 - Symptomatic, <50% confined to bed  PHYSICAL EXAM:   GENERAL:alert, no distress and comfortable, in a wheelchair, Slow to answer questions SKIN: skin color, texture, turgor are normal, no rashes or significant lesions EYES: normal, Conjunctiva are pink and non-injected, sclera clear LYMPH:  no palpable lymphadenopathy in the cervical, axillary or inguinal LUNGS: clear to auscultation and percussion with normal breathing effort HEART: regular rate & rhythm and no murmurs and no lower extremity edema ABDOMEN:abdomen soft, non-tender and normal bowel sounds Musculoskeletal: Tenderness in the right lower rib region NEURO: alert & oriented x 3 with fluent speech but has some word finding difficulty and slow to answer questions  LABORATORY DATA:  I have reviewed the data as listed     Component Value Date/Time   NA 144  05/14/2024 1543   K 3.3 (L) 05/14/2024 1543   CL 105 05/14/2024 1543   CO2 25 05/14/2024 1543   GLUCOSE 106 (H) 05/14/2024 1543   GLUCOSE 56 (L) 05/08/2024 1103   BUN 17 05/14/2024 1543   CREATININE 1.00 05/14/2024 1543   CREATININE 0.74 09/24/2013 1119   CALCIUM  8.5 (L) 05/14/2024 1543   PROT 7.4 05/08/2024 1103   PROT 6.7 02/24/2023 1121   ALBUMIN 3.9 05/08/2024 1103   ALBUMIN 4.4 02/24/2023 1121   AST 21 05/08/2024 1103   ALT 22 05/08/2024 1103   ALKPHOS 63 05/08/2024 1103   BILITOT 0.3 05/08/2024 1103   BILITOT 0.4 02/24/2023 1121   GFRNONAA >60 05/08/2024 1103   GFRNONAA 86 09/24/2013 1119   GFRAA 80 07/15/2020 1113   GFRAA >89 09/24/2013 1119    Lab Results  Component Value Date   WBC 3.5 (L) 05/08/2024   NEUTROABS 1.9 05/08/2024   HGB 11.5 (L) 05/08/2024   HCT 34.4 (L) 05/08/2024   MCV 104.2 (H) 05/08/2024   PLT 123 (L) 05/08/2024     Latest Reference Range & Units 03/26/24 11:11  Total Protein ELP 6.0 - 8.5 g/dL 7.4  Albumin ELP 2.9 - 4.4 g/dL 3.4  Globulin, Total 2.2 - 3.9 g/dL 4.0 (H) (C)  A/G Ratio 0.7 - 1.7  0.9 (C)  Alpha-1-Globulin 0.0 - 0.4 g/dL 0.3  Joeyj-7-Honalopw 0.4 - 1.0 g/dL 0.9  Beta Globulin 0.7 - 1.3 g/dL 1.0  Gamma Globulin 0.4 - 1.8 g/dL 1.8  M-SPIKE, % Not Observed g/dL 1.1 (H)  SPE Interp.  Comment  Comment  Comment  (H): Data is abnormally high (C): Corrected   Latest Reference Range & Units 03/26/24 11:11  Kappa free light chain 3.3 - 19.4 mg/L 20.0 (H)  Lambda free light chains 5.7 - 26.3 mg/L 7.7  Kappa, lambda light chain ratio 0.26 - 1.65  2.60 (H)  (H): Data is abnormally high  RADIOGRAPHIC STUDIES: I have personally reviewed the radiological images as listed and agreed with the findings in the report. "

## 2024-06-05 NOTE — Patient Instructions (Signed)

## 2024-06-05 NOTE — Progress Notes (Signed)
 Patient has been examined by Dr. Davonna. Vital signs and labs have been reviewed by MD - ANC, Creatinine, LFTs, hemoglobin, and platelets have been reviewed by M.D. - pt may proceed with treatment.  Primary RN and pharmacy notified.

## 2024-06-05 NOTE — Patient Instructions (Signed)
 [facility] - A DEPT OF [facility]  Discharge Instructions: Thank you for choosing [facility] to provide your oncology and hematology care.  If you have a lab appointment with the Cancer Center - please note that after April 8th, 2024, all labs will be drawn in the cancer center.  You do not have to check in or register with the main entrance as you have in the past but will complete your check-in in the cancer center.  Wear comfortable clothing and clothing appropriate for easy access to any Portacath or PICC line.   We strive to give you quality time with your provider. You may need to reschedule your appointment if you arrive late (15 or more minutes).  Arriving late affects you and other patients whose appointments are after yours.  Also, if you miss three or more appointments without notifying the office, you may be dismissed from the clinic at the providers discretion.      For prescription refill requests, have your pharmacy contact our office and allow 72 hours for refills to be completed.    Today you received the following chemotherapy and/or immunotherapy agents Velcade /Darzalex  Faspro/Xgeva       To help prevent nausea and vomiting after your treatment, we encourage you to take your nausea medication as directed.  Bortezomib  Injection What is this medication? BORTEZOMIB  (bor TEZ oh mib) treats lymphoma. It may also be used to treat multiple myeloma, a type of bone marrow cancer. It works by blocking a protein that causes cancer cells to grow and multiply. This helps to slow or stop the spread of cancer cells. This medicine may be used for other purposes; ask your health care provider or pharmacist if you have questions. COMMON BRAND NAME(S): BORUZU , Velcade  What should I tell my care team before I take this medication? They need to know if you have any of these conditions: Dehydration Diabetes Heart disease Liver disease Tingling  of the fingers or toes or other nerve disorder An unusual or allergic reaction to bortezomib , other medications, foods, dyes, or preservatives If you or your partner are pregnant or trying to get pregnant Breastfeeding How should I use this medication? This medication is injected into a vein or under the skin. It is given by your care team in a hospital or clinic setting. Talk to your care team about the use of this medication in children. Special care may be needed. Overdosage: If you think you have taken too much of this medicine contact a poison control center or emergency room at once. NOTE: This medicine is only for you. Do not share this medicine with others. What if I miss a dose? Keep appointments for follow-up doses. It is important not to miss your dose. Call your care team if you are unable to keep an appointment. What may interact with this medication? Ketoconazole Rifampin This list may not describe all possible interactions. Give your health care provider a list of all the medicines, herbs, non-prescription drugs, or dietary supplements you use. Also tell them if you smoke, drink alcohol, or use illegal drugs. Some items may interact with your medicine. What should I watch for while using this medication? Your condition will be monitored carefully while you are receiving this medication. You may need blood work while taking this medication. This medication may affect your coordination, reaction time, or judgment. Do not drive or operate machinery until you know how this medication affects you. Sit up  or stand slowly to reduce the risk of dizzy or fainting spells. Drinking alcohol with this medication can increase the risk of these side effects. This medication may increase your risk of getting an infection. Call your care team for advice if you get a fever, chills, sore throat, or other symptoms of a cold or flu. Do not treat yourself. Try to avoid being around people who are  sick. Check with your care team if you have severe diarrhea, nausea, and vomiting, or if you sweat a lot. The loss of too much body fluid may make it dangerous for you to take this medication. Talk to your care team if you may be pregnant. Serious birth defects can occur if you take this medication during pregnancy and for 7 months after the last dose. You will need a negative pregnancy test before starting this medication. Contraception is recommended while taking this medication and for 7 months after the last dose. Your care team can help you find the option that works for you. If your partner can get pregnant, use a condom during sex while taking this medication and for 4 months after the last dose. Do not breastfeed while taking this medication and for 2 months after the last dose. This medication may cause infertility. Talk to your care team if you are concerned about your fertility. What side effects may I notice from receiving this medication? Side effects that you should report to your care team as soon as possible: Allergic reactions--skin rash, itching, hives, swelling of the face, lips, tongue, or throat Bleeding--bloody or black, tar-like stools, vomiting blood or brown material that looks like coffee grounds, red or dark brown urine, small red or purple spots on skin, unusual bruising or bleeding Bleeding in the brain--severe headache, stiff neck, confusion, dizziness, change in vision, numbness or weakness of the face, arm, or leg, trouble speaking, trouble walking, vomiting Bowel blockage--stomach cramping, unable to have a bowel movement or pass gas, loss of appetite, vomiting Heart failure--shortness of breath, swelling of the ankles, feet, or hands, sudden weight gain, unusual weakness or fatigue Infection--fever, chills, cough, sore throat, wounds that don't heal, pain or trouble when passing urine, general feeling of discomfort or being unwell Liver injury--right upper belly pain,  loss of appetite, nausea, light-colored stool, dark yellow or brown urine, yellowing skin or eyes, unusual weakness or fatigue Low blood pressure--dizziness, feeling faint or lightheaded, blurry vision Lung injury--shortness of breath or trouble breathing, cough, spitting up blood, chest pain, fever Pain, tingling, or numbness in the hands or feet Severe or prolonged diarrhea Stomach pain, bloody diarrhea, pale skin, unusual weakness or fatigue, decrease in the amount of urine, which may be signs of hemolytic uremic syndrome Sudden and severe headache, confusion, change in vision, seizures, which may be signs of posterior reversible encephalopathy syndrome (PRES) TTP--purple spots on the skin or inside the mouth, pale skin, yellowing skin or eyes, unusual weakness or fatigue, fever, fast or irregular heartbeat, confusion, change in vision, trouble speaking, trouble walking Tumor lysis syndrome (TLS)--nausea, vomiting, diarrhea, decrease in the amount of urine, dark urine, unusual weakness or fatigue, confusion, muscle pain or cramps, fast or irregular heartbeat, joint pain Side effects that usually do not require medical attention (report to your care team if they continue or are bothersome): Constipation Diarrhea Fatigue Loss of appetite Nausea This list may not describe all possible side effects. Call your doctor for medical advice about side effects. You may report side effects to FDA at 1-800-FDA-1088.  Where should I keep my medication? This medication is given in a hospital or clinic. It will not be stored at home. NOTE: This sheet is a summary. It may not cover all possible information. If you have questions about this medicine, talk to your doctor, pharmacist, or health care provider.  2024 Elsevier/Gold Standard (2021-10-12 00:00:00)   Daratumumab ; Hyaluronidase  Injection What is this medication? DARATUMUMAB ; HYALURONIDASE  (dar a toom ue mab; hye al ur ON i dase) treats multiple  myeloma, a type of bone marrow cancer. Daratumumab  works by blocking a protein that causes cancer cells to grow and multiply. This helps to slow or stop the spread of cancer cells. Hyaluronidase  works by increasing the absorption of other medications in the body to help them work better. This medication may also be used treat amyloidosis, a condition that causes the buildup of a protein (amyloid) in your body. It works by reducing the buildup of this protein, which decreases symptoms. It is a combination medication that contains a monoclonal antibody. This medicine may be used for other purposes; ask your health care provider or pharmacist if you have questions. COMMON BRAND NAME(S): DARZALEX  FASPRO What should I tell my care team before I take this medication? They need to know if you have any of these conditions: Heart disease Infection, such as chickenpox, cold sores, herpes, hepatitis B Lung or breathing disease An unusual or allergic reaction to daratumumab , hyaluronidase , other medications, foods, dyes, or preservatives Pregnant or trying to get pregnant Breast-feeding How should I use this medication? This medication is injected under the skin. It is given by your care team in a hospital or clinic setting. Talk to your care team about the use of this medication in children. Special care may be needed. Overdosage: If you think you have taken too much of this medicine contact a poison control center or emergency room at once. NOTE: This medicine is only for you. Do not share this medicine with others. What if I miss a dose? Keep appointments for follow-up doses. It is important not to miss your dose. Call your care team if you are unable to keep an appointment. What may interact with this medication? Interactions have not been studied. This list may not describe all possible interactions. Give your health care provider a list of all the medicines, herbs, non-prescription drugs, or dietary  supplements you use. Also tell them if you smoke, drink alcohol, or use illegal drugs. Some items may interact with your medicine. What should I watch for while using this medication? Your condition will be monitored carefully while you are receiving this medication. This medication can cause serious allergic reactions. To reduce your risk, your care team may give you other medication to take before receiving this one. Be sure to follow the directions from your care team. This medication can affect the results of blood tests to match your blood type. These changes can last for up to 6 months after the final dose. Your care team will do blood tests to match your blood type before you start treatment. Tell all of your care team that you are being treated with this medication before receiving a blood transfusion. This medication can affect the results of some tests used to determine treatment response; extra tests may be needed to evaluate response. Talk to your care team if you wish to become pregnant or think you are pregnant. This medication can cause serious birth defects if taken during pregnancy and for 3 months after the last  dose. A reliable form of contraception is recommended while taking this medication and for 3 months after the last dose. Talk to your care team about effective forms of contraception. Do not breast-feed while taking this medication. What side effects may I notice from receiving this medication? Side effects that you should report to your care team as soon as possible: Allergic reactions--skin rash, itching, hives, swelling of the face, lips, tongue, or throat Heart rhythm changes--fast or irregular heartbeat, dizziness, feeling faint or lightheaded, chest pain, trouble breathing Infection--fever, chills, cough, sore throat, wounds that don't heal, pain or trouble when passing urine, general feeling of discomfort or being unwell Infusion reactions--chest pain, shortness of  breath or trouble breathing, feeling faint or lightheaded Sudden eye pain or change in vision such as blurry vision, seeing halos around lights, vision loss Unusual bruising or bleeding Side effects that usually do not require medical attention (report to your care team if they continue or are bothersome): Constipation Diarrhea Fatigue Nausea Pain, tingling, or numbness in the hands or feet Swelling of the ankles, hands, or feet This list may not describe all possible side effects. Call your doctor for medical advice about side effects. You may report side effects to FDA at 1-800-FDA-1088. Where should I keep my medication? This medication is given in a hospital or clinic. It will not be stored at home. NOTE: This sheet is a summary. It may not cover all possible information. If you have questions about this medicine, talk to your doctor, pharmacist, or health care provider.  2024 Elsevier/Gold Standard (2021-09-14 00:00:00)      BELOW ARE SYMPTOMS THAT SHOULD BE REPORTED IMMEDIATELY: *FEVER GREATER THAN 100.4 F (38 C) OR HIGHER *CHILLS OR SWEATING *NAUSEA AND VOMITING THAT IS NOT CONTROLLED WITH YOUR NAUSEA MEDICATION *UNUSUAL SHORTNESS OF BREATH *UNUSUAL BRUISING OR BLEEDING *URINARY PROBLEMS (pain or burning when urinating, or frequent urination) *BOWEL PROBLEMS (unusual diarrhea, constipation, pain near the anus) TENDERNESS IN MOUTH AND THROAT WITH OR WITHOUT PRESENCE OF ULCERS (sore throat, sores in mouth, or a toothache) UNUSUAL RASH, SWELLING OR PAIN  UNUSUAL VAGINAL DISCHARGE OR ITCHING   Items with * indicate a potential emergency and should be followed up as soon as possible or go to the Emergency Department if any problems should occur.  Please show the CHEMOTHERAPY ALERT CARD or IMMUNOTHERAPY ALERT CARD at check-in to the Emergency Department and triage nurse.  Should you have questions after your visit or need to cancel or reschedule your appointment, please contact  [facility] [place] - A DEPT OF [facility] [place] [facility] [phone]  and follow the prompts.  Office hours are 8:00 a.m. to 4:30 p.m. Monday - Friday. Please note that voicemails left after 4:00 p.m. may not be returned until the following business day.  We are closed weekends and major holidays. You have access to a nurse at all times for urgent questions. Please call the main number to the clinic [phone] and follow the prompts.  For any non-urgent questions, you may also contact your provider using MyChart. We now offer e-Visits for anyone 67 and older to request care online for non-urgent symptoms. For details visit [email or web address].   Also download the MyChart app! Go to the app store, search MyChart, open the app, select [facility], and log in with your MyChart username and password.

## 2024-06-05 NOTE — Progress Notes (Signed)
 Patient presents today for chemotherapy Darzalex  Faspro/Velcade /Xgeva  injections. Patient is in satisfactory condition with no new complaints voiced.  Vital signs are stable.  Labs reviewed by Dr. Davonna during the office visit and all labs are within treatment parameters.  We will proceed with treatment per MD orders.   Patient denies any tooth or jaw pain. No recent or future major dental appointments at this time. Patient reports taking Calcium /Vit D supplements as directed.   Treatment given today per MD orders. Tolerated infusion without adverse affects. Vital signs stable. No complaints at this time. Discharged from clinic via wheelchair  in stable condition. Alert and oriented x 3. F/U with [facility] as scheduled.

## 2024-06-06 ENCOUNTER — Encounter (HOSPITAL_COMMUNITY): Payer: Self-pay | Admitting: Oncology

## 2024-06-06 ENCOUNTER — Encounter: Payer: Self-pay | Admitting: Oncology

## 2024-06-06 LAB — KAPPA/LAMBDA LIGHT CHAINS
Kappa free light chain: 23.1 mg/L — ABNORMAL HIGH (ref 3.3–19.4)
Kappa, lambda light chain ratio: 2.82 — ABNORMAL HIGH (ref 0.26–1.65)
Lambda free light chains: 8.2 mg/L (ref 5.7–26.3)

## 2024-06-07 LAB — PROTEIN ELECTROPHORESIS, SERUM
A/G Ratio: 0.7 (ref 0.7–1.7)
Albumin ELP: 3.2 g/dL (ref 2.9–4.4)
Alpha-1-Globulin: 0.3 g/dL (ref 0.0–0.4)
Alpha-2-Globulin: 0.8 g/dL (ref 0.4–1.0)
Beta Globulin: 1.1 g/dL (ref 0.7–1.3)
Gamma Globulin: 2.2 g/dL — ABNORMAL HIGH (ref 0.4–1.8)
Globulin, Total: 4.3 g/dL — ABNORMAL HIGH (ref 2.2–3.9)
M-Spike, %: 1.5 g/dL — ABNORMAL HIGH
Total Protein ELP: 7.5 g/dL (ref 6.0–8.5)

## 2024-06-11 ENCOUNTER — Other Ambulatory Visit: Payer: Self-pay

## 2024-06-11 ENCOUNTER — Other Ambulatory Visit: Payer: Self-pay | Admitting: *Deleted

## 2024-06-11 MED ORDER — LENALIDOMIDE 10 MG PO CAPS: ORAL_CAPSULE | ORAL | 0 refills | Status: AC

## 2024-06-11 NOTE — Telephone Encounter (Signed)
 Chart reviewed. Revlimid  refilled per last office note with Dr. Davonna.

## 2024-06-18 ENCOUNTER — Encounter (HOSPITAL_COMMUNITY): Payer: Self-pay | Admitting: Oncology

## 2024-06-18 ENCOUNTER — Encounter: Payer: Self-pay | Admitting: Oncology

## 2024-06-18 ENCOUNTER — Other Ambulatory Visit: Payer: Self-pay

## 2024-06-19 ENCOUNTER — Inpatient Hospital Stay: Admitting: Oncology

## 2024-06-19 ENCOUNTER — Inpatient Hospital Stay

## 2024-06-26 ENCOUNTER — Other Ambulatory Visit: Payer: Self-pay | Admitting: Family Medicine

## 2024-06-26 DIAGNOSIS — E7849 Other hyperlipidemia: Secondary | ICD-10-CM

## 2024-07-02 ENCOUNTER — Ambulatory Visit

## 2024-07-03 ENCOUNTER — Inpatient Hospital Stay: Admitting: Oncology

## 2024-07-03 ENCOUNTER — Inpatient Hospital Stay

## 2024-07-03 ENCOUNTER — Inpatient Hospital Stay: Attending: Hematology

## 2024-07-23 ENCOUNTER — Ambulatory Visit: Admitting: Family Medicine

## 2024-07-31 ENCOUNTER — Inpatient Hospital Stay: Attending: Hematology

## 2024-07-31 ENCOUNTER — Inpatient Hospital Stay: Admitting: Oncology

## 2024-07-31 ENCOUNTER — Inpatient Hospital Stay

## 2024-08-02 ENCOUNTER — Encounter (HOSPITAL_COMMUNITY)

## 2024-08-02 DIAGNOSIS — Z1231 Encounter for screening mammogram for malignant neoplasm of breast: Secondary | ICD-10-CM

## 2024-10-01 ENCOUNTER — Ambulatory Visit

## 2024-10-02 ENCOUNTER — Ambulatory Visit

## 2024-12-31 ENCOUNTER — Ambulatory Visit

## 2025-01-01 ENCOUNTER — Ambulatory Visit

## 2025-04-01 ENCOUNTER — Ambulatory Visit

## 2025-04-02 ENCOUNTER — Ambulatory Visit

## 2025-07-01 ENCOUNTER — Ambulatory Visit

## 2025-09-30 ENCOUNTER — Ambulatory Visit

## 2025-12-30 ENCOUNTER — Ambulatory Visit

## 2026-03-31 ENCOUNTER — Ambulatory Visit
